# Patient Record
Sex: Female | Born: 1955
Health system: Southern US, Community
[De-identification: ages and names within clinical notes are randomized; demographics above are authoritative.]

## PROBLEM LIST (undated history)

## (undated) DIAGNOSIS — R21 Rash and other nonspecific skin eruption: Secondary | ICD-10-CM

## (undated) DIAGNOSIS — I5032 Chronic diastolic (congestive) heart failure: Secondary | ICD-10-CM

## (undated) DIAGNOSIS — R51 Headache: Secondary | ICD-10-CM

## (undated) DIAGNOSIS — E039 Hypothyroidism, unspecified: Secondary | ICD-10-CM

## (undated) DIAGNOSIS — IMO0002 Reserved for concepts with insufficient information to code with codable children: Secondary | ICD-10-CM

## (undated) DIAGNOSIS — D510 Vitamin B12 deficiency anemia due to intrinsic factor deficiency: Secondary | ICD-10-CM

## (undated) DIAGNOSIS — I4891 Unspecified atrial fibrillation: Secondary | ICD-10-CM

## (undated) DIAGNOSIS — A4901 Methicillin susceptible Staphylococcus aureus infection, unspecified site: Secondary | ICD-10-CM

## (undated) DIAGNOSIS — R0989 Other specified symptoms and signs involving the circulatory and respiratory systems: Secondary | ICD-10-CM

## (undated) DIAGNOSIS — E209 Hypoparathyroidism, unspecified: Secondary | ICD-10-CM

## (undated) DIAGNOSIS — Z8619 Personal history of other infectious and parasitic diseases: Secondary | ICD-10-CM

## (undated) DIAGNOSIS — R160 Hepatomegaly, not elsewhere classified: Secondary | ICD-10-CM

## (undated) DIAGNOSIS — M797 Fibromyalgia: Secondary | ICD-10-CM

## (undated) DIAGNOSIS — C55 Malignant neoplasm of uterus, part unspecified: Secondary | ICD-10-CM

## (undated) DIAGNOSIS — N200 Calculus of kidney: Secondary | ICD-10-CM

## (undated) DIAGNOSIS — I10 Essential (primary) hypertension: Secondary | ICD-10-CM

## (undated) DIAGNOSIS — K209 Esophagitis, unspecified: Secondary | ICD-10-CM

## (undated) DIAGNOSIS — I700 Atherosclerosis of aorta: Secondary | ICD-10-CM

## (undated) DIAGNOSIS — Z9289 Personal history of other medical treatment: Secondary | ICD-10-CM

## (undated) DIAGNOSIS — D219 Benign neoplasm of connective and other soft tissue, unspecified: Secondary | ICD-10-CM

## (undated) DIAGNOSIS — N12 Tubulo-interstitial nephritis, not specified as acute or chronic: Secondary | ICD-10-CM

## (undated) DIAGNOSIS — M7070 Other bursitis of hip, unspecified hip: Secondary | ICD-10-CM

## (undated) DIAGNOSIS — J449 Chronic obstructive pulmonary disease, unspecified: Secondary | ICD-10-CM

## (undated) DIAGNOSIS — F329 Major depressive disorder, single episode, unspecified: Secondary | ICD-10-CM

## (undated) DIAGNOSIS — I499 Cardiac arrhythmia, unspecified: Secondary | ICD-10-CM

## (undated) DIAGNOSIS — C73 Malignant neoplasm of thyroid gland: Secondary | ICD-10-CM

## (undated) DIAGNOSIS — L97509 Non-pressure chronic ulcer of other part of unspecified foot with unspecified severity: Secondary | ICD-10-CM

## (undated) DIAGNOSIS — R87619 Unspecified abnormal cytological findings in specimens from cervix uteri: Secondary | ICD-10-CM

## (undated) DIAGNOSIS — M47814 Spondylosis without myelopathy or radiculopathy, thoracic region: Secondary | ICD-10-CM

## (undated) DIAGNOSIS — M1990 Unspecified osteoarthritis, unspecified site: Secondary | ICD-10-CM

## (undated) DIAGNOSIS — A4902 Methicillin resistant Staphylococcus aureus infection, unspecified site: Secondary | ICD-10-CM

## (undated) DIAGNOSIS — K760 Fatty (change of) liver, not elsewhere classified: Secondary | ICD-10-CM

## (undated) DIAGNOSIS — E538 Deficiency of other specified B group vitamins: Secondary | ICD-10-CM

## (undated) DIAGNOSIS — R112 Nausea with vomiting, unspecified: Secondary | ICD-10-CM

## (undated) DIAGNOSIS — J4489 Other specified chronic obstructive pulmonary disease: Secondary | ICD-10-CM

## (undated) DIAGNOSIS — J189 Pneumonia, unspecified organism: Secondary | ICD-10-CM

## (undated) DIAGNOSIS — R600 Localized edema: Secondary | ICD-10-CM

## (undated) DIAGNOSIS — F411 Generalized anxiety disorder: Secondary | ICD-10-CM

## (undated) DIAGNOSIS — Z9889 Other specified postprocedural states: Secondary | ICD-10-CM

## (undated) DIAGNOSIS — R569 Unspecified convulsions: Secondary | ICD-10-CM

## (undated) DIAGNOSIS — D509 Iron deficiency anemia, unspecified: Secondary | ICD-10-CM

## (undated) DIAGNOSIS — Z8679 Personal history of other diseases of the circulatory system: Secondary | ICD-10-CM

## (undated) DIAGNOSIS — K219 Gastro-esophageal reflux disease without esophagitis: Secondary | ICD-10-CM

## (undated) HISTORY — PX: APPENDECTOMY: SHX54

## (undated) HISTORY — DX: Essential (primary) hypertension: I10

## (undated) HISTORY — DX: Reserved for concepts with insufficient information to code with codable children: IMO0002

## (undated) HISTORY — DX: Non-pressure chronic ulcer of other part of unspecified foot with unspecified severity: L97.509

## (undated) HISTORY — DX: Unspecified atrial fibrillation: I48.91

## (undated) HISTORY — PX: COLONOSCOPY: SHX174

## (undated) HISTORY — DX: Unspecified osteoarthritis, unspecified site: M19.90

## (undated) HISTORY — DX: Gastro-esophageal reflux disease without esophagitis: K21.9

## (undated) HISTORY — DX: Morbid (severe) obesity due to excess calories: E66.01

## (undated) HISTORY — PX: OTHER SURGICAL HISTORY: SHX169

## (undated) HISTORY — DX: Iron deficiency anemia, unspecified: D50.9

## (undated) HISTORY — PX: TONSILLECTOMY AND ADENOIDECTOMY: SUR1326

## (undated) HISTORY — DX: Fatty (change of) liver, not elsewhere classified: K76.0

## (undated) HISTORY — DX: Calculus of kidney: N20.0

## (undated) HISTORY — DX: Vitamin B12 deficiency anemia due to intrinsic factor deficiency: D51.0

## (undated) HISTORY — PX: BUNIONECTOMY: SHX129

## (undated) HISTORY — DX: Hypothyroidism, unspecified: E03.9

## (undated) HISTORY — DX: Unspecified abnormal cytological findings in specimens from cervix uteri: R87.619

## (undated) HISTORY — DX: Benign neoplasm of connective and other soft tissue, unspecified: D21.9

## (undated) HISTORY — DX: Major depressive disorder, single episode, unspecified: F32.9

## (undated) HISTORY — DX: Hypoparathyroidism, unspecified: E20.9

## (undated) HISTORY — DX: Generalized anxiety disorder: F41.1

## (undated) HISTORY — DX: Malignant neoplasm of thyroid gland: C73

## (undated) HISTORY — PX: UPPER GASTROINTESTINAL ENDOSCOPY: SHX188

---
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
## 2003-05-03 ENCOUNTER — Encounter: Payer: Self-pay | Admitting: Podiatrist

## 2005-04-25 ENCOUNTER — Encounter: Payer: Self-pay | Admitting: Internal Medicine

## 2005-04-25 LAB — CONVERTED CEMR LAB

## 2006-03-13 ENCOUNTER — Ambulatory Visit: Payer: Self-pay | Admitting: Internal Medicine

## 2006-04-03 ENCOUNTER — Ambulatory Visit: Payer: Self-pay | Admitting: Internal Medicine

## 2006-04-03 LAB — CONVERTED CEMR LAB: TSH: 5.27 microintl units/mL (ref 0.35–5.50)

## 2006-04-10 ENCOUNTER — Ambulatory Visit: Payer: Self-pay | Admitting: Internal Medicine

## 2006-05-09 ENCOUNTER — Ambulatory Visit: Payer: Self-pay | Admitting: Family Medicine

## 2006-05-11 ENCOUNTER — Ambulatory Visit: Payer: Self-pay | Admitting: Internal Medicine

## 2006-05-11 LAB — CONVERTED CEMR LAB: TSH: 5.03 microintl units/mL (ref 0.35–5.50)

## 2006-05-15 ENCOUNTER — Ambulatory Visit: Payer: Self-pay | Admitting: Internal Medicine

## 2006-06-19 ENCOUNTER — Ambulatory Visit: Payer: Self-pay | Admitting: Internal Medicine

## 2006-06-19 LAB — CONVERTED CEMR LAB: TSH: 8.11 microintl units/mL — ABNORMAL HIGH (ref 0.35–5.50)

## 2006-06-30 ENCOUNTER — Ambulatory Visit: Payer: Self-pay | Admitting: Internal Medicine

## 2006-07-28 ENCOUNTER — Ambulatory Visit: Payer: Self-pay | Admitting: Internal Medicine

## 2006-08-13 ENCOUNTER — Ambulatory Visit: Payer: Self-pay | Admitting: Pulmonary Disease

## 2006-08-13 LAB — CONVERTED CEMR LAB
ALT: 37 units/L (ref 0–40)
AST: 29 units/L (ref 0–37)
Albumin: 3.3 g/dL — ABNORMAL LOW (ref 3.5–5.2)
Alkaline Phosphatase: 97 units/L (ref 39–117)
BUN: 11 mg/dL (ref 6–23)
Basophils Relative: 1.3 % — ABNORMAL HIGH (ref 0.0–1.0)
Bilirubin, Direct: 0.2 mg/dL (ref 0.0–0.3)
CO2: 27 meq/L (ref 19–32)
Calcium: 8.3 mg/dL — ABNORMAL LOW (ref 8.4–10.5)
Chloride: 104 meq/L (ref 96–112)
Creatinine, Ser: 0.8 mg/dL (ref 0.4–1.2)
Eosinophils Relative: 2.8 % (ref 0.0–5.0)
GFR calc Af Amer: 98 mL/min
GFR calc non Af Amer: 81 mL/min
Glucose, Bld: 107 mg/dL — ABNORMAL HIGH (ref 70–99)
HCT: 31.5 % — ABNORMAL LOW (ref 36.0–46.0)
Hemoglobin: 10.9 g/dL — ABNORMAL LOW (ref 12.0–15.0)
IgE (Immunoglobulin E), Serum: 774.9 intl units/mL — ABNORMAL HIGH (ref 0.0–180.0)
Lymphocytes Relative: 8.9 % — ABNORMAL LOW (ref 12.0–46.0)
MCHC: 34.5 g/dL (ref 30.0–36.0)
MCV: 78.2 fL (ref 78.0–100.0)
Monocytes Relative: 2.7 % — ABNORMAL LOW (ref 3.0–11.0)
Neutrophils Relative %: 84.3 % — ABNORMAL HIGH (ref 43.0–77.0)
Platelets: 640 10*3/uL — ABNORMAL HIGH (ref 150–400)
Potassium: 3.6 meq/L (ref 3.5–5.1)
RBC: 4.03 M/uL (ref 3.87–5.11)
RDW: 17.2 % — ABNORMAL HIGH (ref 11.5–14.6)
Sodium: 139 meq/L (ref 135–145)
TSH: 4.34 microintl units/mL (ref 0.35–5.50)
Total Bilirubin: 0.7 mg/dL (ref 0.3–1.2)
Total Protein: 6.4 g/dL (ref 6.0–8.3)
WBC: 10.2 10*3/uL (ref 4.5–10.5)

## 2006-08-18 ENCOUNTER — Ambulatory Visit: Payer: Self-pay | Admitting: Pulmonary Disease

## 2006-09-10 ENCOUNTER — Ambulatory Visit: Payer: Self-pay | Admitting: Pulmonary Disease

## 2006-10-01 ENCOUNTER — Ambulatory Visit: Payer: Self-pay | Admitting: Internal Medicine

## 2006-10-01 LAB — CONVERTED CEMR LAB
BUN: 11 mg/dL (ref 6–23)
CO2: 27 meq/L (ref 19–32)
Calcium: 8.5 mg/dL (ref 8.4–10.5)
Chloride: 106 meq/L (ref 96–112)
Creatinine, Ser: 0.8 mg/dL (ref 0.4–1.2)
Ferritin: 31.8 ng/mL (ref 10.0–291.0)
GFR calc Af Amer: 98 mL/min
GFR calc non Af Amer: 81 mL/min
Glucose, Bld: 87 mg/dL (ref 70–99)
Iron: 37 ug/dL — ABNORMAL LOW (ref 42–145)
Potassium: 4.1 meq/L (ref 3.5–5.1)
Pro B Natriuretic peptide (BNP): 14 pg/mL (ref 0.0–100.0)
Saturation Ratios: 10.6 % — ABNORMAL LOW (ref 20.0–50.0)
Sodium: 139 meq/L (ref 135–145)
TSH: 2.04 microintl units/mL (ref 0.35–5.50)
Transferrin: 248.9 mg/dL (ref >=212.0)
Vit D, 1,25-Dihydroxy: <7 — ABNORMAL LOW (ref 20–57)

## 2006-10-16 ENCOUNTER — Ambulatory Visit: Payer: Self-pay | Admitting: Internal Medicine

## 2006-10-23 ENCOUNTER — Ambulatory Visit: Payer: Self-pay | Admitting: Internal Medicine

## 2006-10-23 LAB — CONVERTED CEMR LAB
Anti Nuclear Antibody(ANA): NEGATIVE
Calcium, Total (PTH): 9.3 mg/dL (ref 8.4–10.5)
Folate: >20 ng/mL
PTH: 42.7 pg/mL (ref 14.0–72.0)
Rheumatoid fact SerPl-aCnc: <20 intl units/mL — ABNORMAL LOW (ref 0.0–20.0)
Vit D, 1,25-Dihydroxy: 21 (ref 20–57)
Vitamin B-12: 318 pg/mL (ref 211–911)

## 2006-12-04 ENCOUNTER — Ambulatory Visit: Payer: Self-pay | Admitting: Internal Medicine

## 2007-01-05 ENCOUNTER — Encounter: Payer: Self-pay | Admitting: Internal Medicine

## 2007-01-05 DIAGNOSIS — J45909 Unspecified asthma, uncomplicated: Secondary | ICD-10-CM | POA: Insufficient documentation

## 2007-01-05 DIAGNOSIS — I10 Essential (primary) hypertension: Secondary | ICD-10-CM

## 2007-01-05 DIAGNOSIS — J309 Allergic rhinitis, unspecified: Secondary | ICD-10-CM | POA: Insufficient documentation

## 2007-01-05 DIAGNOSIS — E039 Hypothyroidism, unspecified: Secondary | ICD-10-CM | POA: Insufficient documentation

## 2007-01-05 HISTORY — DX: Essential (primary) hypertension: I10

## 2007-02-19 ENCOUNTER — Ambulatory Visit: Payer: Self-pay | Admitting: Internal Medicine

## 2007-05-10 ENCOUNTER — Ambulatory Visit: Payer: Self-pay | Admitting: Internal Medicine

## 2007-05-10 DIAGNOSIS — J069 Acute upper respiratory infection, unspecified: Secondary | ICD-10-CM | POA: Insufficient documentation

## 2007-05-10 DIAGNOSIS — D509 Iron deficiency anemia, unspecified: Secondary | ICD-10-CM | POA: Insufficient documentation

## 2007-05-10 DIAGNOSIS — D649 Anemia, unspecified: Secondary | ICD-10-CM | POA: Insufficient documentation

## 2007-05-10 DIAGNOSIS — E559 Vitamin D deficiency, unspecified: Secondary | ICD-10-CM | POA: Insufficient documentation

## 2007-05-10 HISTORY — DX: Vitamin D deficiency, unspecified: E55.9

## 2007-05-10 HISTORY — DX: Iron deficiency anemia, unspecified: D50.9

## 2007-05-10 LAB — CONVERTED CEMR LAB
ALT: 34 units/L (ref 0–35)
AST: 25 units/L (ref 0–37)
BUN: 17 mg/dL (ref 6–23)
Basophils Relative: 3.4 % — ABNORMAL HIGH (ref 0.0–1.0)
CO2: 28 meq/L (ref 19–32)
Calcium: 8.5 mg/dL (ref 8.4–10.5)
Chloride: 104 meq/L (ref 96–112)
Cholesterol: 82 mg/dL (ref 0–200)
Creatinine, Ser: 0.7 mg/dL (ref 0.4–1.2)
Eosinophils Relative: 1.5 % (ref 0.0–5.0)
GFR calc Af Amer: 114 mL/min
GFR calc non Af Amer: 95 mL/min
Glucose, Bld: 96 mg/dL (ref 70–99)
HCT: 32.7 % — ABNORMAL LOW (ref 36.0–46.0)
HDL: 15.6 mg/dL — ABNORMAL LOW (ref >39.0)
Hemoglobin: 10.7 g/dL — ABNORMAL LOW (ref 12.0–15.0)
LDL Cholesterol: 53 mg/dL (ref 0–99)
Lymphocytes Relative: 12.6 % (ref 12.0–46.0)
MCHC: 32.6 g/dL (ref 30.0–36.0)
MCV: 80.7 fL (ref 78.0–100.0)
Monocytes Relative: 3 % (ref 3.0–11.0)
Neutrophils Relative %: 79.5 % — ABNORMAL HIGH (ref 43.0–77.0)
Platelets: 185 10*3/uL (ref 150–400)
Potassium: 4.2 meq/L (ref 3.5–5.1)
RBC: 4.05 M/uL (ref 3.87–5.11)
RDW: 17.5 % — ABNORMAL HIGH (ref 11.5–14.6)
Sodium: 139 meq/L (ref 135–145)
Total CHOL/HDL Ratio: 5.3
Triglycerides: 68 mg/dL (ref 0–149)
VLDL: 14 mg/dL (ref 0–40)
WBC: 13.1 10*3/uL — ABNORMAL HIGH (ref 4.5–10.5)

## 2007-05-11 ENCOUNTER — Ambulatory Visit: Payer: Self-pay | Admitting: Oncology

## 2007-05-12 LAB — CONVERTED CEMR LAB: Vit D, 1,25-Dihydroxy: 18 — ABNORMAL LOW (ref 30–89)

## 2007-05-21 ENCOUNTER — Telehealth: Payer: Self-pay | Admitting: Internal Medicine

## 2007-06-09 ENCOUNTER — Encounter: Payer: Self-pay | Admitting: Internal Medicine

## 2007-06-09 LAB — MORPHOLOGY

## 2007-06-09 LAB — CBC & DIFF AND RETIC
BASO%: 0.2 % (ref 0.0–2.0)
EOS%: 1.5 % (ref 0.0–7.0)
IRF: 0.48 — ABNORMAL HIGH (ref 0.130–0.330)
MCH: 26.2 pg (ref 26.0–34.0)
MCV: 80.2 fL — ABNORMAL LOW (ref 81.0–101.0)
MONO%: 4.8 % (ref 0.0–13.0)
RBC: 4.25 10*6/uL (ref 3.70–5.32)
RDW: 19 % — ABNORMAL HIGH (ref 11.3–14.5)
RETIC #: 125.4 10*3/uL — ABNORMAL HIGH (ref 19.7–115.1)
Retic %: 3 % — ABNORMAL HIGH (ref 0.4–2.3)
lymph#: 1.9 10*3/uL (ref 0.9–3.3)

## 2007-06-09 LAB — URINALYSIS, MICROSCOPIC - CHCC
Bilirubin (Urine): NEGATIVE
Blood: NEGATIVE
Glucose: NEGATIVE g/dL
Leukocyte Esterase: NEGATIVE
Nitrite: NEGATIVE
Specific Gravity, Urine: 1.01 (ref 1.003–1.035)
WBC, UA: NEGATIVE (ref 0–2)

## 2007-06-09 LAB — CHCC SMEAR

## 2007-06-11 LAB — IMMUNOFIXATION ELECTROPHORESIS
IgA: 334 mg/dL (ref 68–378)
IgG (Immunoglobin G), Serum: 1250 mg/dL (ref 694–1618)
IgM, Serum: 244 mg/dL (ref 60–263)
Total Protein, Serum Electrophoresis: 7 g/dL (ref 6.0–8.3)

## 2007-06-11 LAB — FERRITIN: Ferritin: 63 ng/mL (ref 10–291)

## 2007-06-11 LAB — IRON AND TIBC: UIBC: 274 ug/dL

## 2007-07-02 ENCOUNTER — Encounter: Payer: Self-pay | Admitting: Internal Medicine

## 2007-07-14 ENCOUNTER — Ambulatory Visit: Payer: Self-pay | Admitting: Oncology

## 2007-07-14 ENCOUNTER — Encounter: Payer: Self-pay | Admitting: Internal Medicine

## 2007-08-07 ENCOUNTER — Ambulatory Visit: Payer: Self-pay | Admitting: Family Medicine

## 2007-08-07 DIAGNOSIS — J45901 Unspecified asthma with (acute) exacerbation: Secondary | ICD-10-CM | POA: Insufficient documentation

## 2007-09-08 ENCOUNTER — Ambulatory Visit: Payer: Self-pay | Admitting: Oncology

## 2007-09-13 ENCOUNTER — Ambulatory Visit: Payer: Self-pay | Admitting: Internal Medicine

## 2007-09-13 LAB — CBC WITH DIFFERENTIAL/PLATELET
BASO%: 0.6 % (ref 0.0–2.0)
Eosinophils Absolute: 0.2 10*3/uL (ref 0.0–0.5)
MCHC: 32.3 g/dL (ref 32.0–36.0)
MONO#: 0.6 10*3/uL (ref 0.1–0.9)
NEUT#: 9.9 10*3/uL — ABNORMAL HIGH (ref 1.5–6.5)
RBC: 4.4 10*6/uL (ref 3.70–5.32)
RDW: 18.7 % — ABNORMAL HIGH (ref 11.3–14.5)
WBC: 12.5 10*3/uL — ABNORMAL HIGH (ref 3.9–10.0)
lymph#: 1.7 10*3/uL (ref 0.9–3.3)

## 2007-09-13 LAB — IRON AND TIBC
%SAT: 9 % — ABNORMAL LOW (ref 20–55)
Iron: 30 ug/dL — ABNORMAL LOW (ref 42–145)

## 2007-09-13 LAB — CHCC SMEAR

## 2007-09-13 LAB — MORPHOLOGY: PLT EST: ADEQUATE

## 2007-09-13 LAB — CONVERTED CEMR LAB: Vit D, 1,25-Dihydroxy: 33 (ref 30–89)

## 2007-09-27 ENCOUNTER — Telehealth: Payer: Self-pay | Admitting: Internal Medicine

## 2007-10-14 ENCOUNTER — Ambulatory Visit: Payer: Self-pay | Admitting: Internal Medicine

## 2007-10-14 LAB — CONVERTED CEMR LAB
BUN: 10 mg/dL (ref 6–23)
CO2: 25 meq/L (ref 19–32)
Calcium: 8.5 mg/dL (ref 8.4–10.5)
Chloride: 105 meq/L (ref 96–112)
Cholesterol: 105 mg/dL (ref 0–200)
Creatinine, Ser: 0.8 mg/dL (ref 0.4–1.2)
GFR calc Af Amer: 98 mL/min
GFR calc non Af Amer: 81 mL/min
Glucose, Bld: 107 mg/dL — ABNORMAL HIGH (ref 70–99)
HDL: 30.4 mg/dL — ABNORMAL LOW (ref >39.0)
LDL Cholesterol: 62 mg/dL (ref 0–99)
Potassium: 4.1 meq/L (ref 3.5–5.1)
Sodium: 136 meq/L (ref 135–145)
TSH: 6.29 microintl units/mL — ABNORMAL HIGH (ref 0.35–5.50)
Total CHOL/HDL Ratio: 3.5
Triglycerides: 64 mg/dL (ref 0–149)
VLDL: 13 mg/dL (ref 0–40)

## 2007-10-19 LAB — CONVERTED CEMR LAB: Pap Smear: NORMAL

## 2007-10-25 ENCOUNTER — Ambulatory Visit: Payer: Self-pay | Admitting: Internal Medicine

## 2007-11-03 ENCOUNTER — Ambulatory Visit: Payer: Self-pay | Admitting: Oncology

## 2007-11-08 ENCOUNTER — Encounter: Payer: Self-pay | Admitting: Internal Medicine

## 2007-11-08 LAB — CBC WITH DIFFERENTIAL/PLATELET
Basophils Absolute: 0 10*3/uL (ref 0.0–0.1)
EOS%: 1.9 % (ref 0.0–7.0)
HGB: 11 g/dL — ABNORMAL LOW (ref 11.6–15.9)
MCH: 25.1 pg — ABNORMAL LOW (ref 26.0–34.0)
MCV: 77.8 fL — ABNORMAL LOW (ref 81.0–101.0)
MONO%: 4.6 % (ref 0.0–13.0)
NEUT#: 13 10*3/uL — ABNORMAL HIGH (ref 1.5–6.5)
RBC: 4.36 10*6/uL (ref 3.70–5.32)
RDW: 19.6 % — ABNORMAL HIGH (ref 11.3–14.5)
lymph#: 1.9 10*3/uL (ref 0.9–3.3)

## 2007-11-08 LAB — FERRITIN: Ferritin: 47 ng/mL (ref 10–291)

## 2007-12-30 ENCOUNTER — Ambulatory Visit: Payer: Self-pay | Admitting: Oncology

## 2008-01-03 ENCOUNTER — Encounter: Payer: Self-pay | Admitting: Internal Medicine

## 2008-01-03 LAB — COMPREHENSIVE METABOLIC PANEL
Albumin: 4 g/dL (ref 3.5–5.2)
Alkaline Phosphatase: 92 U/L (ref 39–117)
BUN: 14 mg/dL (ref 6–23)
CO2: 23 mEq/L (ref 19–32)
Glucose, Bld: 83 mg/dL (ref 70–99)
Total Bilirubin: 0.4 mg/dL (ref 0.3–1.2)

## 2008-01-03 LAB — MORPHOLOGY: PLT EST: ADEQUATE

## 2008-01-03 LAB — CBC WITH DIFFERENTIAL/PLATELET
BASO%: 0.5 % (ref 0.0–2.0)
EOS%: 2.4 % (ref 0.0–7.0)
MCHC: 32.2 g/dL (ref 32.0–36.0)
MONO#: 0.6 10*3/uL (ref 0.1–0.9)
RBC: 4.39 10*6/uL (ref 3.70–5.32)
RDW: 18.2 % — ABNORMAL HIGH (ref 11.3–14.5)
WBC: 10.6 10*3/uL — ABNORMAL HIGH (ref 3.9–10.0)
lymph#: 1.5 10*3/uL (ref 0.9–3.3)

## 2008-01-03 LAB — IRON AND TIBC
Iron: 41 ug/dL — ABNORMAL LOW (ref 42–145)
TIBC: 361 ug/dL (ref 250–470)
UIBC: 320 ug/dL

## 2008-01-03 LAB — FERRITIN: Ferritin: 47 ng/mL (ref 10–291)

## 2008-02-04 ENCOUNTER — Telehealth: Payer: Self-pay | Admitting: Internal Medicine

## 2008-02-15 ENCOUNTER — Ambulatory Visit: Payer: Self-pay | Admitting: Internal Medicine

## 2008-02-15 LAB — CONVERTED CEMR LAB
BUN: 9 mg/dL (ref 6–23)
CO2: 29 meq/L (ref 19–32)
Calcium: 8.6 mg/dL (ref 8.4–10.5)
Chloride: 105 meq/L (ref 96–112)
Creatinine, Ser: 0.7 mg/dL (ref 0.4–1.2)
GFR calc Af Amer: 114 mL/min
GFR calc non Af Amer: 94 mL/min
Glucose, Bld: 81 mg/dL (ref 70–99)
Potassium: 4.3 meq/L (ref 3.5–5.1)
Sodium: 140 meq/L (ref 135–145)
TSH: 5.47 microintl units/mL (ref 0.35–5.50)

## 2008-02-22 ENCOUNTER — Ambulatory Visit: Payer: Self-pay | Admitting: Internal Medicine

## 2008-03-07 ENCOUNTER — Ambulatory Visit: Payer: Self-pay | Admitting: Internal Medicine

## 2008-03-07 LAB — CONVERTED CEMR LAB
BUN: 12 mg/dL (ref 6–23)
CO2: 30 meq/L (ref 19–32)
Calcium: 8.8 mg/dL (ref 8.4–10.5)
Chloride: 104 meq/L (ref 96–112)
Creatinine, Ser: 0.8 mg/dL (ref 0.4–1.2)
GFR calc Af Amer: 98 mL/min
GFR calc non Af Amer: 81 mL/min
Glucose, Bld: 84 mg/dL (ref 70–99)
Potassium: 3.7 meq/L (ref 3.5–5.1)
Sodium: 141 meq/L (ref 135–145)
TSH: 3.83 microintl units/mL (ref 0.35–5.50)

## 2008-03-09 ENCOUNTER — Telehealth: Payer: Self-pay | Admitting: Internal Medicine

## 2008-03-24 ENCOUNTER — Encounter: Payer: Self-pay | Admitting: Internal Medicine

## 2008-04-17 ENCOUNTER — Ambulatory Visit: Payer: Self-pay | Admitting: Internal Medicine

## 2008-04-17 LAB — CONVERTED CEMR LAB
BUN: 17 mg/dL (ref 6–23)
CO2: 26 meq/L (ref 19–32)
Calcium: 9 mg/dL (ref 8.4–10.5)
Chloride: 102 meq/L (ref 96–112)
Creatinine, Ser: 0.7 mg/dL (ref 0.4–1.2)
GFR calc Af Amer: 114 mL/min
GFR calc non Af Amer: 94 mL/min
Glucose, Bld: 101 mg/dL — ABNORMAL HIGH (ref 70–99)
Potassium: 4 meq/L (ref 3.5–5.1)
Sodium: 136 meq/L (ref 135–145)
TSH: 2.95 microintl units/mL (ref 0.35–5.50)

## 2008-04-24 ENCOUNTER — Ambulatory Visit: Payer: Self-pay | Admitting: Internal Medicine

## 2008-04-25 ENCOUNTER — Telehealth: Payer: Self-pay | Admitting: Internal Medicine

## 2008-05-31 ENCOUNTER — Ambulatory Visit: Payer: Self-pay | Admitting: Internal Medicine

## 2008-05-31 DIAGNOSIS — R300 Dysuria: Secondary | ICD-10-CM | POA: Insufficient documentation

## 2008-05-31 DIAGNOSIS — H6590 Unspecified nonsuppurative otitis media, unspecified ear: Secondary | ICD-10-CM | POA: Insufficient documentation

## 2008-05-31 LAB — CONVERTED CEMR LAB
Bilirubin Urine: NEGATIVE
Blood in Urine, dipstick: NEGATIVE
Glucose, Urine, Semiquant: NEGATIVE
Ketones, urine, test strip: NEGATIVE
Nitrite: NEGATIVE
Protein, U semiquant: NEGATIVE
Specific Gravity, Urine: 1.01
Urobilinogen, UA: 0.2
WBC Urine, dipstick: NEGATIVE
pH: 5

## 2008-06-23 ENCOUNTER — Ambulatory Visit: Payer: Self-pay | Admitting: Oncology

## 2008-07-16 ENCOUNTER — Emergency Department (HOSPITAL_COMMUNITY): Admission: EM | Admit: 2008-07-16 | Discharge: 2008-07-16 | Payer: Self-pay | Admitting: Family Medicine

## 2008-07-19 ENCOUNTER — Ambulatory Visit: Payer: Self-pay | Admitting: Internal Medicine

## 2008-07-19 DIAGNOSIS — L02419 Cutaneous abscess of limb, unspecified: Secondary | ICD-10-CM | POA: Insufficient documentation

## 2008-07-19 DIAGNOSIS — R609 Edema, unspecified: Secondary | ICD-10-CM | POA: Insufficient documentation

## 2008-07-19 DIAGNOSIS — L03119 Cellulitis of unspecified part of limb: Secondary | ICD-10-CM

## 2008-07-21 ENCOUNTER — Telehealth: Payer: Self-pay | Admitting: Internal Medicine

## 2008-07-21 ENCOUNTER — Ambulatory Visit: Payer: Self-pay | Admitting: Internal Medicine

## 2008-07-21 LAB — CONVERTED CEMR LAB
BUN: 19 mg/dL (ref 6–23)
Basophils Absolute: 0 10*3/uL (ref 0.0–0.1)
Basophils Relative: 0 % (ref 0.0–3.0)
CO2: 26 meq/L (ref 19–32)
Calcium: 8.8 mg/dL (ref 8.4–10.5)
Chloride: 99 meq/L (ref 96–112)
Creatinine, Ser: 1 mg/dL (ref 0.4–1.2)
Eosinophils Absolute: 0.3 10*3/uL (ref 0.0–0.7)
Eosinophils Relative: 2 % (ref 0.0–5.0)
GFR calc Af Amer: 75 mL/min
GFR calc non Af Amer: 62 mL/min
Glucose, Bld: 82 mg/dL (ref 70–99)
HCT: 34.2 % — ABNORMAL LOW (ref 36.0–46.0)
Hemoglobin: 11.2 g/dL — ABNORMAL LOW (ref 12.0–15.0)
Lymphocytes Relative: 15.4 % (ref 12.0–46.0)
MCHC: 32.7 g/dL (ref 30.0–36.0)
MCV: 79.7 fL (ref 78.0–100.0)
Monocytes Absolute: 0.5 10*3/uL (ref 0.1–1.0)
Monocytes Relative: 3 % (ref 3.0–12.0)
Neutro Abs: 13.7 10*3/uL — ABNORMAL HIGH (ref 1.4–7.7)
Neutrophils Relative %: 79.6 % — ABNORMAL HIGH (ref 43.0–77.0)
Platelets: 150 10*3/uL (ref 150–400)
Potassium: 3.8 meq/L (ref 3.5–5.1)
RBC: 4.29 M/uL (ref 3.87–5.11)
RDW: 17.8 % — ABNORMAL HIGH (ref 11.5–14.6)
Sodium: 134 meq/L — ABNORMAL LOW (ref 135–145)
TSH: 3.54 microintl units/mL (ref 0.35–5.50)
WBC: 16.8 10*3/uL — ABNORMAL HIGH (ref 4.5–10.5)

## 2008-07-24 ENCOUNTER — Telehealth (INDEPENDENT_AMBULATORY_CARE_PROVIDER_SITE_OTHER): Payer: Self-pay | Admitting: *Deleted

## 2008-08-04 ENCOUNTER — Telehealth (INDEPENDENT_AMBULATORY_CARE_PROVIDER_SITE_OTHER): Payer: Self-pay | Admitting: *Deleted

## 2008-08-07 ENCOUNTER — Ambulatory Visit: Payer: Self-pay | Admitting: Internal Medicine

## 2008-08-08 ENCOUNTER — Ambulatory Visit: Payer: Self-pay | Admitting: Interventional Radiology

## 2008-08-08 ENCOUNTER — Ambulatory Visit (HOSPITAL_BASED_OUTPATIENT_CLINIC_OR_DEPARTMENT_OTHER): Admission: RE | Admit: 2008-08-08 | Discharge: 2008-08-08 | Payer: Self-pay | Admitting: Internal Medicine

## 2008-08-08 ENCOUNTER — Telehealth: Payer: Self-pay | Admitting: Internal Medicine

## 2008-09-07 ENCOUNTER — Ambulatory Visit: Payer: Self-pay | Admitting: Internal Medicine

## 2008-09-07 DIAGNOSIS — J329 Chronic sinusitis, unspecified: Secondary | ICD-10-CM | POA: Insufficient documentation

## 2008-11-10 ENCOUNTER — Telehealth: Payer: Self-pay | Admitting: Internal Medicine

## 2008-12-05 ENCOUNTER — Ambulatory Visit: Payer: Self-pay | Admitting: Internal Medicine

## 2008-12-05 ENCOUNTER — Encounter: Payer: Self-pay | Admitting: Family Medicine

## 2008-12-07 LAB — CONVERTED CEMR LAB
ALT: 30 units/L (ref 0–35)
AST: 19 units/L (ref 0–37)
Albumin: 3.7 g/dL (ref 3.5–5.2)
Alkaline Phosphatase: 96 units/L (ref 39–117)
BUN: 13 mg/dL (ref 6–23)
Bilirubin, Direct: 0.2 mg/dL (ref 0.0–0.3)
CO2: 23 meq/L (ref 19–32)
Calcium: 8.5 mg/dL (ref 8.4–10.5)
Chloride: 105 meq/L (ref 96–112)
Creatinine, Ser: 0.74 mg/dL (ref 0.40–1.20)
Glucose, Bld: 95 mg/dL (ref 70–99)
Indirect Bilirubin: 0.4 mg/dL (ref 0.0–0.9)
Potassium: 3.7 meq/L (ref 3.5–5.3)
Sodium: 139 meq/L (ref 135–145)
TSH: 4.073 microintl units/mL (ref 0.350–4.500)
Total Bilirubin: 0.6 mg/dL (ref 0.3–1.2)
Total Protein: 6.4 g/dL (ref 6.0–8.3)
Vit D, 1,25-Dihydroxy: 33 (ref 30–89)

## 2008-12-20 ENCOUNTER — Ambulatory Visit: Payer: Self-pay | Admitting: Internal Medicine

## 2009-03-22 ENCOUNTER — Ambulatory Visit: Payer: Self-pay | Admitting: Internal Medicine

## 2009-03-22 LAB — CONVERTED CEMR LAB
Basophils Absolute: 0.1 10*3/uL (ref 0.0–0.1)
Basophils Relative: 1 % (ref 0–1)
Eosinophils Absolute: 0.3 10*3/uL (ref 0.0–0.7)
Eosinophils Relative: 2 % (ref 0–5)
HCT: 37.3 % (ref 36.0–46.0)
Hemoglobin: 11.2 g/dL — ABNORMAL LOW (ref 12.0–15.0)
Lymphocytes Relative: 14 % (ref 12–46)
Lymphs Abs: 2.1 10*3/uL (ref 0.7–4.0)
MCHC: 30 g/dL (ref 30.0–36.0)
MCV: 81.1 fL (ref 78.0–100.0)
Monocytes Absolute: 0.8 10*3/uL (ref 0.1–1.0)
Monocytes Relative: 5 % (ref 3–12)
Neutro Abs: 11.9 10*3/uL — ABNORMAL HIGH (ref 1.7–7.7)
Neutrophils Relative %: 79 % — ABNORMAL HIGH (ref 43–77)
Platelets: 197 10*3/uL (ref 150–400)
RBC: 4.6 M/uL (ref 3.87–5.11)
RDW: 17.7 % — ABNORMAL HIGH (ref 11.5–15.5)
TSH: 1.094 microintl units/mL (ref 0.350–4.500)
WBC: 15.2 10*3/uL — ABNORMAL HIGH (ref 4.0–10.5)

## 2009-03-24 ENCOUNTER — Telehealth: Payer: Self-pay | Admitting: Internal Medicine

## 2009-03-24 DIAGNOSIS — D72829 Elevated white blood cell count, unspecified: Secondary | ICD-10-CM | POA: Insufficient documentation

## 2009-03-24 HISTORY — DX: Elevated white blood cell count, unspecified: D72.829

## 2009-03-26 ENCOUNTER — Ambulatory Visit: Payer: Self-pay | Admitting: Hematology & Oncology

## 2009-04-04 ENCOUNTER — Ambulatory Visit: Payer: Self-pay | Admitting: Internal Medicine

## 2009-04-11 ENCOUNTER — Encounter: Payer: Self-pay | Admitting: Internal Medicine

## 2009-04-11 LAB — CBC WITH DIFFERENTIAL (CANCER CENTER ONLY)
BASO#: 0.1 10*3/uL (ref 0.0–0.2)
BASO%: 0.8 % (ref 0.0–2.0)
EOS%: 2.7 % (ref 0.0–7.0)
HGB: 11.4 g/dL — ABNORMAL LOW (ref 11.6–15.9)
MCH: 24.4 pg — ABNORMAL LOW (ref 26.0–34.0)
MCHC: 32.4 g/dL (ref 32.0–36.0)
MONO%: 4.9 % (ref 0.0–13.0)
NEUT#: 8.6 10*3/uL — ABNORMAL HIGH (ref 1.5–6.5)
RDW: 15 % — ABNORMAL HIGH (ref 10.5–14.6)

## 2009-04-11 LAB — CHCC SATELLITE - SMEAR

## 2009-04-11 LAB — VITAMIN B12: Vitamin B-12: 382 pg/mL (ref 211–911)

## 2009-04-11 LAB — TECHNOLOGIST REVIEW CHCC SATELLITE

## 2009-05-22 ENCOUNTER — Ambulatory Visit: Payer: Self-pay | Admitting: Internal Medicine

## 2009-05-22 DIAGNOSIS — R5381 Other malaise: Secondary | ICD-10-CM | POA: Insufficient documentation

## 2009-05-22 DIAGNOSIS — R5383 Other fatigue: Secondary | ICD-10-CM

## 2009-05-22 DIAGNOSIS — K219 Gastro-esophageal reflux disease without esophagitis: Secondary | ICD-10-CM | POA: Insufficient documentation

## 2009-05-22 LAB — CONVERTED CEMR LAB
BUN: 10 mg/dL (ref 6–23)
CO2: 27 meq/L (ref 19–32)
Calcium: 10.1 mg/dL (ref 8.4–10.5)
Chloride: 99 meq/L (ref 96–112)
Creatinine, Ser: 0.81 mg/dL (ref 0.40–1.20)
Glucose, Bld: 89 mg/dL (ref 70–99)
Potassium: 4.4 meq/L (ref 3.5–5.3)
Sodium: 140 meq/L (ref 135–145)
TSH: 2.425 microintl units/mL (ref 0.350–4.500)

## 2009-05-23 ENCOUNTER — Encounter: Payer: Self-pay | Admitting: Internal Medicine

## 2009-06-09 ENCOUNTER — Emergency Department (HOSPITAL_COMMUNITY): Admission: EM | Admit: 2009-06-09 | Discharge: 2009-06-09 | Payer: Self-pay | Admitting: Family Medicine

## 2009-06-18 ENCOUNTER — Encounter: Payer: Self-pay | Admitting: Internal Medicine

## 2009-06-18 ENCOUNTER — Ambulatory Visit (HOSPITAL_BASED_OUTPATIENT_CLINIC_OR_DEPARTMENT_OTHER): Admission: RE | Admit: 2009-06-18 | Discharge: 2009-06-18 | Payer: Self-pay | Admitting: Internal Medicine

## 2009-06-19 ENCOUNTER — Ambulatory Visit: Payer: Self-pay | Admitting: Pulmonary Disease

## 2009-07-02 ENCOUNTER — Telehealth: Payer: Self-pay | Admitting: Internal Medicine

## 2009-07-04 ENCOUNTER — Encounter: Payer: Self-pay | Admitting: Internal Medicine

## 2009-07-04 ENCOUNTER — Encounter: Admission: RE | Admit: 2009-07-04 | Discharge: 2009-07-04 | Payer: Self-pay | Admitting: Internal Medicine

## 2009-07-10 ENCOUNTER — Telehealth: Payer: Self-pay | Admitting: Internal Medicine

## 2009-07-30 ENCOUNTER — Telehealth: Payer: Self-pay | Admitting: Internal Medicine

## 2009-08-14 ENCOUNTER — Ambulatory Visit: Payer: Self-pay | Admitting: Hematology & Oncology

## 2009-08-15 ENCOUNTER — Encounter: Payer: Self-pay | Admitting: Internal Medicine

## 2009-08-15 LAB — CBC WITH DIFFERENTIAL (CANCER CENTER ONLY)
Eosinophils Absolute: 0.3 10*3/uL (ref 0.0–0.5)
HCT: 37.3 % (ref 34.8–46.6)
LYMPH%: 19.8 % (ref 14.0–48.0)
MCV: 82 fL (ref 81–101)
MONO#: 0.8 10*3/uL (ref 0.1–0.9)
NEUT%: 70.9 % (ref 39.6–80.0)
Platelets: 299 10*3/uL (ref 145–400)
RBC: 4.53 10*6/uL (ref 3.70–5.32)
WBC: 12.8 10*3/uL — ABNORMAL HIGH (ref 3.9–10.0)

## 2009-08-15 LAB — TECHNOLOGIST REVIEW CHCC SATELLITE

## 2009-09-19 ENCOUNTER — Ambulatory Visit: Payer: Self-pay | Admitting: Internal Medicine

## 2009-09-19 DIAGNOSIS — M545 Low back pain, unspecified: Secondary | ICD-10-CM | POA: Insufficient documentation

## 2009-09-25 ENCOUNTER — Encounter: Admission: RE | Admit: 2009-09-25 | Discharge: 2009-09-25 | Payer: Self-pay | Admitting: Internal Medicine

## 2009-10-05 ENCOUNTER — Telehealth: Payer: Self-pay | Admitting: Internal Medicine

## 2009-10-10 ENCOUNTER — Ambulatory Visit: Payer: Self-pay | Admitting: Internal Medicine

## 2009-10-10 DIAGNOSIS — L578 Other skin changes due to chronic exposure to nonionizing radiation: Secondary | ICD-10-CM | POA: Insufficient documentation

## 2009-10-24 ENCOUNTER — Telehealth: Payer: Self-pay | Admitting: Internal Medicine

## 2009-10-24 LAB — CONVERTED CEMR LAB: Pap Smear: NORMAL

## 2009-10-25 ENCOUNTER — Encounter: Payer: Self-pay | Admitting: Internal Medicine

## 2009-10-25 ENCOUNTER — Ambulatory Visit: Payer: Self-pay | Admitting: Hematology & Oncology

## 2009-10-25 LAB — CONVERTED CEMR LAB
BUN: 17 mg/dL (ref 6–23)
CO2: 26 meq/L (ref 19–32)
Calcium: 9.4 mg/dL (ref 8.4–10.5)
Chloride: 98 meq/L (ref 96–112)
Creatinine, Ser: 0.86 mg/dL (ref 0.40–1.20)
Glucose, Bld: 89 mg/dL (ref 70–99)
Potassium: 3.7 meq/L (ref 3.5–5.3)
Sodium: 137 meq/L (ref 135–145)

## 2009-10-31 ENCOUNTER — Encounter: Payer: Self-pay | Admitting: Internal Medicine

## 2009-10-31 LAB — CBC WITH DIFFERENTIAL (CANCER CENTER ONLY)
BASO#: 0.1 10*3/uL (ref 0.0–0.2)
Eosinophils Absolute: 0.4 10*3/uL (ref 0.0–0.5)
HCT: 39.6 % (ref 34.8–46.6)
HGB: 12.8 g/dL (ref 11.6–15.9)
LYMPH#: 2.8 10*3/uL (ref 0.9–3.3)
MONO#: 0.7 10*3/uL (ref 0.1–0.9)
NEUT%: 64.9 % (ref 39.6–80.0)
WBC: 11.2 10*3/uL — ABNORMAL HIGH (ref 3.9–10.0)

## 2009-11-09 ENCOUNTER — Encounter: Payer: Self-pay | Admitting: Internal Medicine

## 2010-01-10 ENCOUNTER — Ambulatory Visit: Payer: Self-pay | Admitting: Internal Medicine

## 2010-02-05 ENCOUNTER — Ambulatory Visit (HOSPITAL_BASED_OUTPATIENT_CLINIC_OR_DEPARTMENT_OTHER): Admission: RE | Admit: 2010-02-05 | Discharge: 2010-02-05 | Payer: Self-pay | Admitting: Internal Medicine

## 2010-02-05 ENCOUNTER — Ambulatory Visit: Payer: Self-pay | Admitting: Internal Medicine

## 2010-02-05 ENCOUNTER — Ambulatory Visit: Payer: Self-pay | Admitting: Interventional Radiology

## 2010-02-05 DIAGNOSIS — R070 Pain in throat: Secondary | ICD-10-CM | POA: Insufficient documentation

## 2010-02-05 DIAGNOSIS — N951 Menopausal and female climacteric states: Secondary | ICD-10-CM | POA: Insufficient documentation

## 2010-02-06 LAB — CONVERTED CEMR LAB
BUN: 10 mg/dL (ref 6–23)
CO2: 24 meq/L (ref 19–32)
Calcium: 8.7 mg/dL (ref 8.4–10.5)
Chloride: 103 meq/L (ref 96–112)
Creatinine, Ser: 0.79 mg/dL (ref 0.40–1.20)
Free T4: 1.54 ng/dL (ref 0.80–1.80)
Glucose, Bld: 95 mg/dL (ref 70–99)
IgE (Immunoglobulin E), Serum: 463.6 intl units/mL — ABNORMAL HIGH (ref 0.0–180.0)
Potassium: 3.8 meq/L (ref 3.5–5.3)
Sodium: 138 meq/L (ref 135–145)
TSH: 0.364 microintl units/mL (ref 0.350–4.500)

## 2010-02-07 ENCOUNTER — Telehealth: Payer: Self-pay | Admitting: Internal Medicine

## 2010-02-07 DIAGNOSIS — E041 Nontoxic single thyroid nodule: Secondary | ICD-10-CM | POA: Insufficient documentation

## 2010-02-19 ENCOUNTER — Other Ambulatory Visit: Admission: RE | Admit: 2010-02-19 | Discharge: 2010-02-19 | Payer: Self-pay | Admitting: Diagnostic Radiology

## 2010-02-19 ENCOUNTER — Encounter: Admission: RE | Admit: 2010-02-19 | Discharge: 2010-02-19 | Payer: Self-pay | Admitting: Internal Medicine

## 2010-02-19 ENCOUNTER — Encounter: Payer: Self-pay | Admitting: Internal Medicine

## 2010-02-22 ENCOUNTER — Telehealth: Payer: Self-pay | Admitting: Internal Medicine

## 2010-03-14 ENCOUNTER — Ambulatory Visit: Payer: Self-pay | Admitting: Internal Medicine

## 2010-03-14 DIAGNOSIS — B370 Candidal stomatitis: Secondary | ICD-10-CM | POA: Insufficient documentation

## 2010-03-18 ENCOUNTER — Ambulatory Visit: Payer: Self-pay | Admitting: Internal Medicine

## 2010-03-20 LAB — CONVERTED CEMR LAB: IgE (Immunoglobulin E), Serum: 542.1 intl units/mL — ABNORMAL HIGH (ref 0.0–180.0)

## 2010-04-22 ENCOUNTER — Ambulatory Visit (HOSPITAL_COMMUNITY): Admission: RE | Admit: 2010-04-22 | Discharge: 2010-04-22 | Payer: Self-pay | Admitting: Surgery

## 2010-04-29 ENCOUNTER — Ambulatory Visit: Payer: Self-pay | Admitting: Hematology & Oncology

## 2010-04-30 ENCOUNTER — Telehealth: Payer: Self-pay | Admitting: Internal Medicine

## 2010-04-30 LAB — CONVERTED CEMR LAB
BUN: 15 mg/dL (ref 6–23)
Basophils Absolute: 0 10*3/uL (ref 0.0–0.1)
Basophils Relative: 0 % (ref 0–1)
CO2: 28 meq/L (ref 19–32)
CRP, High Sensitivity: 20 — ABNORMAL HIGH
Calcium: 9 mg/dL (ref 8.4–10.5)
Chloride: 103 meq/L (ref 96–112)
Cholesterol: 130 mg/dL (ref 0–200)
Creatinine, Ser: 0.81 mg/dL (ref 0.40–1.20)
Eosinophils Absolute: 0.3 10*3/uL (ref 0.0–0.7)
Eosinophils Relative: 2 % (ref 0–5)
Glucose, Bld: 79 mg/dL (ref 70–99)
HCT: 38.1 % (ref 36.0–46.0)
HDL: 39 mg/dL — ABNORMAL LOW (ref >39)
Hemoglobin: 11.6 g/dL — ABNORMAL LOW (ref 12.0–15.0)
LDL Cholesterol: 62 mg/dL (ref 0–99)
Lymphocytes Relative: 18 % (ref 12–46)
Lymphs Abs: 2 10*3/uL (ref 0.7–4.0)
MCHC: 30.4 g/dL (ref 30.0–36.0)
MCV: 83.2 fL (ref 78.0–100.0)
Monocytes Absolute: 0.6 10*3/uL (ref 0.1–1.0)
Monocytes Relative: 5 % (ref 3–12)
Neutro Abs: 8.2 10*3/uL — ABNORMAL HIGH (ref 1.7–7.7)
Neutrophils Relative %: 74 % (ref 43–77)
Potassium: 4.4 meq/L (ref 3.5–5.3)
RBC: 4.58 M/uL (ref 3.87–5.11)
RDW: 17.5 % — ABNORMAL HIGH (ref 11.5–15.5)
Sodium: 140 meq/L (ref 135–145)
TSH: 0.316 microintl units/mL — ABNORMAL LOW (ref 0.350–4.500)
Total CHOL/HDL Ratio: 3.3
Triglycerides: 146 mg/dL (ref <150)
VLDL: 29 mg/dL (ref 0–40)
WBC: 11 10*3/uL — ABNORMAL HIGH (ref 4.0–10.5)

## 2010-05-01 ENCOUNTER — Encounter: Payer: Self-pay | Admitting: Internal Medicine

## 2010-05-01 ENCOUNTER — Ambulatory Visit (HOSPITAL_BASED_OUTPATIENT_CLINIC_OR_DEPARTMENT_OTHER)
Admission: RE | Admit: 2010-05-01 | Discharge: 2010-05-01 | Payer: Self-pay | Source: Home / Self Care | Admitting: Hematology & Oncology

## 2010-05-01 LAB — CBC WITH DIFFERENTIAL (CANCER CENTER ONLY)
BASO%: 0.6 % (ref 0.0–2.0)
EOS%: 2.6 % (ref 0.0–7.0)
HCT: 37 % (ref 34.8–46.6)
LYMPH#: 2.1 10*3/uL (ref 0.9–3.3)
MCHC: 32.4 g/dL (ref 32.0–36.0)
MONO#: 0.6 10*3/uL (ref 0.1–0.9)
NEUT#: 8.9 10*3/uL — ABNORMAL HIGH (ref 1.5–6.5)
NEUT%: 74.2 % (ref 39.6–80.0)
Platelets: 297 10*3/uL (ref 145–400)
RDW: 14 % (ref 10.5–14.6)
WBC: 12 10*3/uL — ABNORMAL HIGH (ref 3.9–10.0)

## 2010-05-01 LAB — TECHNOLOGIST REVIEW CHCC SATELLITE

## 2010-05-01 LAB — RETICULOCYTES (CHCC)
RBC.: 4.59 MIL/uL (ref 3.87–5.11)
Retic Ct Pct: 2.7 % (ref 0.4–3.1)

## 2010-05-01 LAB — CHCC SATELLITE - SMEAR

## 2010-05-09 ENCOUNTER — Encounter: Payer: Self-pay | Admitting: Internal Medicine

## 2010-05-09 ENCOUNTER — Ambulatory Visit: Payer: Self-pay | Admitting: Internal Medicine

## 2010-05-09 LAB — CONVERTED CEMR LAB
Blood in Urine, dipstick: NEGATIVE
Ketones, urine, test strip: NEGATIVE
Nitrite: NEGATIVE

## 2010-05-15 ENCOUNTER — Encounter: Payer: Self-pay | Admitting: Internal Medicine

## 2010-05-15 ENCOUNTER — Encounter (INDEPENDENT_AMBULATORY_CARE_PROVIDER_SITE_OTHER): Payer: Self-pay | Admitting: Surgery

## 2010-05-15 ENCOUNTER — Ambulatory Visit (HOSPITAL_COMMUNITY)
Admission: RE | Admit: 2010-05-15 | Discharge: 2010-05-16 | Payer: Self-pay | Source: Home / Self Care | Attending: Surgery | Admitting: Surgery

## 2010-05-15 HISTORY — PX: THYROIDECTOMY: SHX17

## 2010-05-22 ENCOUNTER — Inpatient Hospital Stay (HOSPITAL_COMMUNITY): Admission: EM | Admit: 2010-05-22 | Discharge: 2010-05-29 | Payer: Self-pay | Source: Home / Self Care

## 2010-05-23 ENCOUNTER — Encounter (INDEPENDENT_AMBULATORY_CARE_PROVIDER_SITE_OTHER): Payer: Self-pay | Admitting: General Surgery

## 2010-05-29 LAB — CALCIUM: Calcium: 7.8 mg/dL — ABNORMAL LOW (ref 8.4–10.5)

## 2010-06-04 ENCOUNTER — Encounter: Payer: Self-pay | Admitting: Internal Medicine

## 2010-06-11 ENCOUNTER — Encounter: Payer: Self-pay | Admitting: Internal Medicine

## 2010-06-11 ENCOUNTER — Ambulatory Visit: Payer: Self-pay | Admitting: Hematology & Oncology

## 2010-06-11 ENCOUNTER — Ambulatory Visit
Admission: RE | Admit: 2010-06-11 | Discharge: 2010-06-11 | Payer: Self-pay | Source: Home / Self Care | Attending: Internal Medicine | Admitting: Internal Medicine

## 2010-06-12 ENCOUNTER — Encounter: Payer: Self-pay | Admitting: Internal Medicine

## 2010-06-12 LAB — COMPREHENSIVE METABOLIC PANEL
ALT: 27 U/L (ref 0–35)
AST: 23 U/L (ref 0–37)
Albumin: 4.1 g/dL (ref 3.5–5.2)
Alkaline Phosphatase: 98 U/L (ref 39–117)
BUN: 15 mg/dL (ref 6–23)
CO2: 26 mEq/L (ref 19–32)
Calcium: 8.1 mg/dL — ABNORMAL LOW (ref 8.4–10.5)
Chloride: 100 mEq/L (ref 96–112)
Creatinine, Ser: 0.88 mg/dL (ref 0.40–1.20)
Glucose, Bld: 88 mg/dL (ref 70–99)
Potassium: 4 mEq/L (ref 3.5–5.3)
Sodium: 139 mEq/L (ref 135–145)
Total Bilirubin: 0.3 mg/dL (ref 0.3–1.2)
Total Protein: 7.2 g/dL (ref 6.0–8.3)

## 2010-06-12 LAB — CBC WITH DIFFERENTIAL (CANCER CENTER ONLY)
BASO#: 0.1 10*3/uL (ref 0.0–0.2)
BASO%: 0.7 % (ref 0.0–2.0)
EOS%: 3 % (ref 0.0–7.0)
Eosinophils Absolute: 0.4 10*3/uL (ref 0.0–0.5)
HCT: 37.2 % (ref 34.8–46.6)
HGB: 11.9 g/dL (ref 11.6–15.9)
LYMPH#: 2.3 10*3/uL (ref 0.9–3.3)
LYMPH%: 17.9 % (ref 14.0–48.0)
MCH: 25.4 pg — ABNORMAL LOW (ref 26.0–34.0)
MCHC: 32 g/dL (ref 32.0–36.0)
MCV: 79 fL — ABNORMAL LOW (ref 81–101)
MONO#: 0.6 10*3/uL (ref 0.1–0.9)
MONO%: 4.9 % (ref 0.0–13.0)
NEUT#: 9.4 10*3/uL — ABNORMAL HIGH (ref 1.5–6.5)
NEUT%: 73.5 % (ref 39.6–80.0)
Platelets: 341 10*3/uL (ref 145–400)
RBC: 4.68 10*6/uL (ref 3.70–5.32)
RDW: 15.2 % — ABNORMAL HIGH (ref 10.5–14.6)
WBC: 12.8 10*3/uL — ABNORMAL HIGH (ref 3.9–10.0)

## 2010-06-12 LAB — FERRITIN: Ferritin: 135 ng/mL (ref 10–291)

## 2010-06-12 LAB — RETICULOCYTES (CHCC)
ABS Retic: 98.3 10*3/uL (ref 19.0–186.0)
RBC.: 4.68 MIL/uL (ref 3.87–5.11)
Retic Ct Pct: 2.1 % (ref 0.4–3.1)

## 2010-06-12 LAB — CHCC SATELLITE - SMEAR

## 2010-06-14 ENCOUNTER — Ambulatory Visit (HOSPITAL_COMMUNITY)
Admission: RE | Admit: 2010-06-14 | Discharge: 2010-06-14 | Payer: Self-pay | Source: Home / Self Care | Attending: Internal Medicine | Admitting: Internal Medicine

## 2010-06-14 ENCOUNTER — Ambulatory Visit: Admission: RE | Admit: 2010-06-14 | Discharge: 2010-06-14 | Payer: Self-pay | Source: Home / Self Care

## 2010-06-16 NOTE — Discharge Summary (Signed)
[name], [name]               ACCOUNT NO.:  [id number]  MEDICAL RECORD NO.:  [id number]          PATIENT TYPE:  INP  LOCATION:  5153                         FACILITY:  [facility]  PHYSICIAN:  Velora Heckler, MD      DATE OF BIRTH:  November 29, 1955  DATE OF ADMISSION:  05/22/2010 DATE OF DISCHARGE:  05/29/2010                        DISCHARGE SUMMARY - REFERRING   ADMISSION DIAGNOSES: 1. Hypocalcemia, symptomatic, status post thyroidectomy. 2. Thyroid nodule with multifocal papillary carcinoma on pathology. 3. Hypersomnia with sleep apnea. 4. Asthma. 5. Hypertension. 6. Allergic rhinitis. 7. Status post tonsil and adenoidectomy, gastric bypass and     appendectomy.  DISCHARGE DIAGNOSES: 1. Hypocalcemia, symptomatic, status post thyroidectomy. 2. Thyroid nodule with multifocal papillary carcinoma on pathology. 3. Hypersomnia with sleep apnea. 4. Asthma. 5. Hypertension. 6. Allergic rhinitis. 7. Status post tonsil and adenoidectomy, gastric bypass and     appendectomy.  PROCEDURES:  Replacement of calcium.  BRIEF HISTORY:  The patient is a 55 year old white female who recently underwent total thyroidectomy on May 15, 2010.  She had been found to have a thyroid nodule and there was concern that it was cancerous. She is, subsequently since, discharged and developed tingling and leg cramps.  She was seen and was found to have a calcium of 5.5 and was admitted for management of her severe hypocalcemia.  She was seen on a daily basis and her calcium slowly improved.  By December 28, it was up to 7.1; December 30 at 8.1 and 8.2, December 31 it was 7.8 and 7.6; May 26, 2010 it was 8; May 27, 2010 it was 7.5; January 3 it was 7.9.  Despite the fact that she was up in the 7's and the 8's, she was still having intermittent symptoms.  She developed phlebitis secondary to the IV calcium replacement and had to have a PICC line placed on May 25, 2010.  Today, the patient's has  remained asymptomatic.  Her calcium is up to 7.8 and it was Dr. Ardine Eng opinion that she could be discharged home.  She will go home on her previous home meds and will continue the Tums calcium carbonate replacement, 6 tablets by mouth every 6 hours.  She will be seen back in our office by Dr. Gerrit Friends.  The date is being worked out by [facility] and they will call her.  We will recheck her calcium on Friday.  She has a prescription and will go and get it checked at the independent lab in the early morning.  She is scheduled to see oncology and Dr. Sharl Ma on June 11, 2010.  DISCHARGE MEDICATIONS: 1. Calcium elemental Tums, 6 tablets q.6 hours. 2. Accolate 1 tablet b.i.d. 3. B12 at 500 mcg 1 daily. 4. Claritin D 1 daily. 5. Diovan 160 mg daily. 6. DuoNebs b.i.d. 7. Lasix 40 mg daily. 8. Hydrochlorothiazide 25 mg daily. 9. Iron dextran 150 mg daily. 10.Levothyroxine 100 mcg 2 tablets daily. 11.Omeprazole-sodium bicarb 40/1100 one daily. 12.Symbicort 2 puffs b.i.d. 13.Ventolin inhalers q.6 hours p.r.n. 14.Vitamin C 1000 mg daily. 15.And D2 at 50,000 units daily.  CONDITION ON DISCHARGE:  Improved.  Eber Hong, P.A.   ______________________________ Velora Heckler, MD    WDJ/MEDQ  D:  05/29/2010  T:  05/29/2010  Job:  [id number]  cc:   Barbette Hair. Artist Pais, DO  Electronically Signed by Sherrie George P.A. on 05/30/2010 01:07:01 PM Electronically Signed by Darnell Level MD on 06/16/2010 09:53:04 AM

## 2010-06-25 ENCOUNTER — Other Ambulatory Visit (HOSPITAL_COMMUNITY): Payer: Self-pay | Admitting: Internal Medicine

## 2010-06-25 ENCOUNTER — Encounter (HOSPITAL_COMMUNITY)
Admission: RE | Admit: 2010-06-25 | Discharge: 2010-06-25 | Payer: Self-pay | Source: Home / Self Care | Attending: Internal Medicine | Admitting: Internal Medicine

## 2010-06-25 ENCOUNTER — Encounter: Payer: Self-pay | Admitting: Internal Medicine

## 2010-06-25 DIAGNOSIS — C73 Malignant neoplasm of thyroid gland: Secondary | ICD-10-CM

## 2010-06-25 NOTE — Assessment & Plan Note (Signed)
Summary: FOLLOW-UP PER PT/LD   Vital Signs:  Patient profile:   55 year old female Weight:      321.50 pounds BMI:     55.38 O2 Sat:      95 % on Room air Temp:     98.0 degrees F oral Pulse rate:   73 / minute Pulse rhythm:   regular Resp:     20 per minute BP sitting:   128 / 70  (right arm) Cuff size:   large  Vitals Entered By: Glendell Docker CMA (November 09, 2009 10:57 AM)  O2 Flow:  Room air CC: Rm 2- Follow up , asthma   Primary Care Provider:  Dondra Spry DO  CC:  Rm 2- Follow up  and asthma.  History of Present Illness:      This is a 55 year old female who presents with asthma.  The patient complains of history of diagnosed Asthma, but denies cough, shortness of breath, chest tightness, wheezing, and mucous production.  The patient also has the following associated problems: heartburn.  Previous effective treatment includes ICS + LABA.    c/o gum irritation.  no white plaques.  tried ovavig - no improvment  Allergies: 1)  ! Augmentin 2)  ! Pcn  Past History:  Past Medical History: Allergic rhinitis Asthma Hypertension   Hypothyroidism    Morbid obesity - S/P Gastric Bypass    Anemia - iron deficiency  Vit D deficiency      Past Surgical History: T&A Bunions Bilat. feet  Appendectomy   Gastric bypass 1976         Family History: Father with history of hypertension, diabetes, lung cancer, and asbestosis Mother with history of hypertension, hyperthyroidism, heart disease Brother with asthma         Social History: Occupation: works in Medical laboratory scientific officer  Single     Former Smoker - quit tobacco 12 years ago.  She was light smoker for 10 years.        Review of Systems      See HPI for Pulmonary and Cardiac review of systems.  Physical Exam  General:  alert and well-developed.   Lungs:  normal respiratory effort, normal breath sounds, and no wheezes.   Heart:  normal rate, regular rhythm, and no gallop.   Extremities:  trace left pedal edema  and trace right pedal edema.   Neurologic:  cranial nerves II-XII intact and gait normal.     Impression & Recommendations:  Problem # 1:  ASTHMA (ICD-493.90) Assessment Unchanged well controlled. rarely uses rescue inhaler.  decrease advair dose to 250/50  Her updated medication list for this problem includes:    Ventolin Hfa 108 (90 Base) Mcg/act Aers (Albuterol sulfate) .[name]... 2 puffs every 6 hours as needed    Duoneb 2.5-0.5 Mg/39ml Soln (Albuterol-ipratropium) .[name]... Twice weekly as needed    Albuterol Sulfate (2.5 Mg/53ml) 0.083% Nebu (Albuterol sulfate) ..... Use qid as needed    Accolate 20 Mg Tabs (Zafirlukast) ..... One by mouth bid    Advair Diskus 250-50 Mcg/dose Aepb (Fluticasone-salmeterol) ..... One dose two times a day  Complete Medication List: 1)  Hydrochlorothiazide 25 Mg Tabs (Hydrochlorothiazide) .... One by mouth once daily 2)  Levothyroxine Sodium 200 Mcg Tabs (Levothyroxine sodium) .... 2 tabs by mouth qd 3)  Omeprazole-sodium Bicarbonate 40-1100 Mg Caps (Omeprazole-sodium bicarbonate) .... One by mouth qd 4)  Claritin-d 12 Hour 5-120 Mg Tb12 (Loratadine-pseudoephedrine) 5)  Vitamin B-12 500 Mcg Tabs (Cyanocobalamin) .Marland KitchenMarland KitchenMarland Kitchen  Take 1 tablet by mouth once a day 6)  Ventolin Hfa 108 (90 Base) Mcg/act Aers (Albuterol sulfate) .... 2 puffs every 6 hours as needed 7)  Duoneb 2.5-0.5 Mg/40ml Soln (Albuterol-ipratropium) .... Twice weekly as needed 8)  Nu-iron 150 Mg Caps (Polysaccharide iron complex) .[name].. 1 by mouth once daily 9)  Vitamin D 25366 Unit Caps (Ergocalciferol) .... By mouth once weekly 10)  Diovan 160 Mg Tabs (Valsartan) .... One by mouth once daily 11)  Vitamin C Cr 1000 Mg Cr-tabs (Ascorbic acid) .... Take 1 tablet by mouth once a day 12)  Compressor/nebulizer Misc (Nebulizers) .... Dispense one nebulizer re:  493.90 13)  Furosemide 40 Mg Tabs (Furosemide) .... 1/2 to 1  by mouth once daily as needed 14)  Albuterol Sulfate (2.5 Mg/77ml) 0.083% Nebu (Albuterol  sulfate) .... Use qid as needed 15)  Accolate 20 Mg Tabs (Zafirlukast) .... One by mouth bid 16)  Advair Diskus 250-50 Mcg/dose Aepb (Fluticasone-salmeterol) .... One dose two times a day 17)  Desoximetasone 0.25 % Crea (Desoximetasone) .... Apply two times a day x 1 week 18)  Oravig 50 Mg Tabs (Miconazole) .... 50 mg to inside of mouth x 2 weeks   Patient Instructions: 1)  Please schedule a follow-up appointment in 6 months. 2)  BMP prior to visit, ICD-9:  401.9 3)  TSH prior to visit, ICD-9: 244.9 4)  Please return for lab work one (1) week before your next appointment.  Prescriptions: ADVAIR DISKUS 250-50 MCG/DOSE AEPB (FLUTICASONE-SALMETEROL) one dose two times a day  #1 month x 5   Entered and Authorized by:   D. Thomos Lemons DO   Signed by:   D. Thomos Lemons DO on 11/09/2009   Method used:   Electronically to        [facility] [address] (retail)       [phone]       [place], [place]  [address]       Ph: [phone]       Fax: [phone]   RxID:   [id number]

## 2010-06-25 NOTE — Assessment & Plan Note (Signed)
Summary: hurt back/dt   Vital Signs:  Patient profile:   55 year old female Height:      64 inches Weight:      328.25 pounds BMI:     56.55 O2 Sat:      96 % on Room air Temp:     97.0 degrees F oral Pulse rate:   72 / minute Pulse rhythm:   regular Resp:     20 per minute BP sitting:   126 / 76  (left arm) Cuff size:   large  Vitals Entered By: Glendell Docker CMA (September 19, 2009 10:49 AM)  O2 Flow:  Room air CC: Rm 3- Back Pain Pain Assessment Patient in pain? yes     Location: hip/ shoulder blades Intensity: 7 Type: dul/sharp Onset of pain  Constant   Primary Care Provider:  Dondra Spry DO  CC:  Rm 3- Back Pain.  History of Present Illness: 55 y/o white female with hx of morbid obesity c/o low back pain left sided low back and also pain in between shoulder blades left sided pain keep her up at night back pain feels like someone is punching her on that side take otc tylenol and aleve.  some improvement  with tylenol severity 8 out of 10   Preventive Screening-Counseling & Management  Alcohol-Tobacco     Smoking Status: quit  Allergies: 1)  ! Augmentin 2)  ! Pcn  Past History:  Past Medical History: Allergic rhinitis Asthma Hypertension  Hypothyroidism  Morbid obesity - S/P Gastric Bypass    Anemia - iron deficiency  Vit D deficiency      Past Surgical History: T&A Bunions Bilat. feet  Appendectomy  Gastric bypass 1976      Family History: Father with history of hypertension, diabetes, lung cancer, and asbestosis Mother with history of hypertension, hyperthyroidism, heart disease Brother with asthma       Social History: Occupation: works in Medical laboratory scientific officer  Single    Former Smoker - quit tobacco 12 years ago.  She was light smoker for 10 years.      Review of Systems       no urinary symptoms  Physical Exam  General:  alert and overweight-appearing.   Lungs:  normal respiratory effort and normal breath sounds.   Heart:   normal rate, regular rhythm, and no gallop.   Abdomen:  soft, non-tender, normal bowel sounds, and no masses.   Extremities:  trace left pedal edema and trace right pedal edema.   Neurologic:  cranial nerves II-XII intact and gait normal.     Impression & Recommendations:  Problem # 1:  LOW BACK PAIN, ACUTE (ICD-724.2)  acute left lower back pain.  probable strain vs spondylosis.  no improvement with OTC NSAIDs.  use muscle relaxer and tramadol as needed.  Patient advised to call office if symptoms persist or worsen.   Her updated medication list for this problem includes:    Metaxalone 800 Mg Tabs (Metaxalone) ..... One by mouth three times a day as needed low back pain    Tramadol Hcl 50 Mg Tabs (Tramadol hcl) ..... One by mouth two times a day as needed  Complete Medication List: 1)  Maxzide-25 37.5-25 Mg Tabs (Triamterene-hctz) .... Take 1 tablet by mouth once a day 2)  Levothyroxine Sodium 200 Mcg Tabs (Levothyroxine sodium) .... 2 tabs by mouth qd 3)  Omeprazole-sodium Bicarbonate 40-1100 Mg Caps (Omeprazole-sodium bicarbonate) .... One by mouth qd 4)  Claritin-d 12 Hour  5-120 Mg Tb12 (Loratadine-pseudoephedrine) 5)  Advair Diskus 500-50 Mcg/dose Misc (Fluticasone-salmeterol) .[name].. 1 puff  two times a day 6)  Vitamin B-12 500 Mcg Tabs (Cyanocobalamin) .... Take 1 tablet by mouth once a day 7)  Ventolin Hfa 108 (90 Base) Mcg/act Aers (Albuterol sulfate) .... 2 puffs every 6 hours as needed 8)  Duoneb 2.5-0.5 Mg/70ml Soln (Albuterol-ipratropium) .... Twice weekly as needed 9)  Nu-iron 150 Mg Caps (Polysaccharide iron complex) .[name].. 1 by mouth once daily 10)  Vitamin D 36644 Unit Caps (Ergocalciferol) .... By mouth once weekly 11)  Diovan 160 Mg Tabs (Valsartan) .... One by mouth once daily 12)  Vitamin C Cr 1000 Mg Cr-tabs (Ascorbic acid) .... Take 1 tablet by mouth once a day 13)  Compressor/nebulizer Misc (Nebulizers) .... Dispense one nebulizer re:  493.90 14)  Furosemide 40 Mg  Tabs (Furosemide) .... 1/2 to 1  by mouth once daily as needed 15)  Albuterol Sulfate (2.5 Mg/35ml) 0.083% Nebu (Albuterol sulfate) .... Use qid as needed 16)  Accolate 20 Mg Tabs (Zafirlukast) .... One by mouth bid 17)  Metaxalone 800 Mg Tabs (Metaxalone) .... One by mouth three times a day as needed low back pain 18)  Tramadol Hcl 50 Mg Tabs (Tramadol hcl) .... One by mouth two times a day as needed  Patient Instructions: 1)  Call our office if your symptoms do not  improve or gets worse. 2)  Schedule appt within 2 weeks if you back pain does not improve Prescriptions: TRAMADOL HCL 50 MG TABS (TRAMADOL HCL) one by mouth two times a day as needed  #30 x 0   Entered and Authorized by:   D. Thomos Lemons DO   Signed by:   D. Thomos Lemons DO on 09/19/2009   Method used:   Electronically to        [facility] [address] [place] (retail)       [phone]       [place], [place]  [address]       Ph: [phone]       Fax: [phone]   RxID:   [id number] METAXALONE 800 MG TABS (METAXALONE) one by mouth three times a day as needed low back pain  #30 x 0   Entered and Authorized by:   D. Thomos Lemons DO   Signed by:   D. Thomos Lemons DO on 09/19/2009   Method used:   Electronically to        [facility] [address] [place] (retail)       [phone]       [place], [place]  [address]       Ph: [phone]       Fax: [phone]   RxID:   [id number]   Current Allergies (reviewed today): ! AUGMENTIN ! PCN

## 2010-06-25 NOTE — Progress Notes (Signed)
Summary: REFILL OF ADVIAR   Phone Note Call from Patient Call back at Home Phone [phone]   Caller: PT LIVE Call For: [name]  Summary of Call: SHE WAS IN TO SEE DR Artist Pais IN [place].  DR Zendaya Groseclose HAS BEEN REFILLING THE ADVAIR FOR HER FOR SEVERAL YEARS.  PLEASE CALL IN HER ADVAIR  Initial call taken by: Roselle Locus,  July 02, 2009 2:23 PM    Prescriptions: ADVAIR DISKUS 500-50 MCG/DOSE  MISC (FLUTICASONE-SALMETEROL) 1 puff  two times a day  #3  month x 3   Entered and Authorized by:   D. Thomos Lemons DO   Signed by:   D. Thomos Lemons DO on 07/02/2009   Method used:   Electronically to        [facility] [address] (retail)       [phone]       [place], [place]  [address]       Ph: [phone]       Fax: [phone]   RxID:   [id number]

## 2010-06-25 NOTE — Progress Notes (Signed)
Summary: Pt returned call  Phone Note Outgoing Call   Summary of Call: LMOVM for pt to call back re:  test results Initial call taken by: D. Thomos Lemons DO,  February 07, 2010 5:44 PM  Follow-up for Phone Call        Pt returned your call. Call  back at [phone]. Nicki Guadalajara Fergerson CMA Duncan Dull)  February 11, 2010 5:22 PM   Additional Follow-up for Phone Call Additional follow up Details #1::        notified pt of thyroid u/s results pt is having less frequent symptoms I advised pt have thyroid nodule biopsy  she is having less throat tightness symptoms I doubt symptoms attributable to small thyroid nodule question angioedema.   I advised allergy referral Additional Follow-up by: D. Thomos Lemons DO,  February 11, 2010 6:08 PM  New Problems: THYROID NODULE, LEFT (ICD-241.0)   Additional Follow-up for Phone Call Additional follow up Details #2::    Pt scheduled for FNA  , [facility]    Sept  27  ,  Appt Dr Maple Hudson  , Oct 20th pt notified of both appt. Follow-up by: Darral Dash,  February 12, 2010 1:47 PM  New Problems: THYROID NODULE, LEFT (ICD-241.0)

## 2010-06-25 NOTE — Progress Notes (Signed)
Summary: Lab order  Phone Note Call from Patient   Caller: Patient Details for Reason: Lab order Summary of Call:  Pt is on her way to lab - needs orders sent Initial call taken by: Darral Dash,  April 30, 2010 10:55 AM  Follow-up for Phone Call        Pt is in the lab now. Please advise what labs pt needs to have. Nicki Guadalajara Fergerson CMA Duncan Dull)  April 30, 2010 11:17 AM   Additional Follow-up for Phone Call Additional follow up Details #1::        BMP prior to visit, ICD-9:  401.9 Lipid Panel prior to visit, ICD-9:  401.9 TSH prior to visit, ICD-9: 244.9 CBC - 285.9 High sensitivity CRP :  401.9   Additional Follow-up by: D. Thomos Lemons DO,  April 30, 2010 1:19 PM    Additional Follow-up for Phone Call Additional follow up Details #2::    Order placed and faxed to the lab. Nicki Guadalajara Fergerson CMA Duncan Dull)  April 30, 2010 2:43 PM

## 2010-06-25 NOTE — Assessment & Plan Note (Signed)
Summary: 4 MONTH FOLLOW UP/[facility]   Vital Signs:  Patient profile:   55 year old female Height:      64 inches Weight:      321 pounds BMI:     55.30 O2 Sat:      95 % on Room air Temp:     97.8 degrees F oral Pulse rate:   71 / minute Pulse rhythm:   regular Resp:     18 per minute BP sitting:   110 / 72  (right arm) Cuff size:   Thigh  Vitals Entered By: Glendell Docker CMA (Oct 10, 2009 10:56 AM)  O2 Flow:  Room air CC: Rm 3- Follow up disease management, Rash   Primary Care Provider:  Dondra Spry DO  CC:  Rm 3- Follow up disease management and Rash.  History of Present Illness:  Rash      This is a 55 year old woman who presents with Rash.  The patient reports macules, but denies pustules and blisters.  The rash is located on the chest, right arm, and left arm.  The rash is worse with sun exposure  asthma - stable.  no recent exacerbations.  using zegerid as directed  Allergies: 1)  ! Augmentin 2)  ! Pcn  Past History:  Past Medical History: Allergic rhinitis Asthma Hypertension  Hypothyroidism   Morbid obesity - S/P Gastric Bypass    Anemia - iron deficiency  Vit D deficiency      Past Surgical History: T&A Bunions Bilat. feet  Appendectomy   Gastric bypass 1976      Family History: Father with history of hypertension, diabetes, lung cancer, and asbestosis Mother with history of hypertension, hyperthyroidism, heart disease Brother with asthma        Social History: Occupation: works in Medical laboratory scientific officer  Single    Former Smoker - quit tobacco 12 years ago.  She was light smoker for 10 years.       Physical Exam  General:  alert and overweight-appearing.   Lungs:  normal respiratory effort and normal breath sounds.   Heart:  normal rate, regular rhythm, and no gallop.   Extremities:  trace left pedal edema and trace right pedal edema.   Skin:  macular rash over arms and chest (sun exposed areas)   Impression & Recommendations:  Problem #  1:  DERMATITIS DUE TO SOLAR RADIATION (ICD-692.79)  Her updated medication list for this problem includes:    Desoximetasone 0.25 % Crea (Desoximetasone) .[name]... Apply two times a day x 1 week  Discussed avoidance of triggers and symptomatic treatment.   Problem # 2:  ASTHMA (ICD-493.90) Assessment: Unchanged stable.    continue maintenace inhalers and PPI  The following medications were removed from the medication list:    Advair Diskus 500-50 Mcg/dose Misc (Fluticasone-salmeterol) .[name]... 1 puff  two times a day Her updated medication list for this problem includes:    Ventolin Hfa 108 (90 Base) Mcg/act Aers (Albuterol sulfate) .[name]... 2 puffs every 6 hours as needed    Duoneb 2.5-0.5 Mg/30ml Soln (Albuterol-ipratropium) .[name]... Twice weekly as needed    Albuterol Sulfate (2.5 Mg/42ml) 0.083% Nebu (Albuterol sulfate) ..... Use qid as needed    Accolate 20 Mg Tabs (Zafirlukast) ..... One by mouth bid    Symbicort 160-4.5 Mcg/act Aero (Budesonide-formoterol fumarate) .[name]... 2 puffs two times a day  Complete Medication List: 1)  Hydrochlorothiazide 25 Mg Tabs (Hydrochlorothiazide) .... One by mouth once daily 2)  Levothyroxine Sodium  200 Mcg Tabs (Levothyroxine sodium) .... 2 tabs by mouth qd 3)  Omeprazole-sodium Bicarbonate 40-1100 Mg Caps (Omeprazole-sodium bicarbonate) .... One by mouth qd 4)  Claritin-d 12 Hour 5-120 Mg Tb12 (Loratadine-pseudoephedrine) 5)  Vitamin B-12 500 Mcg Tabs (Cyanocobalamin) .... Take 1 tablet by mouth once a day 6)  Ventolin Hfa 108 (90 Base) Mcg/act Aers (Albuterol sulfate) .... 2 puffs every 6 hours as needed 7)  Duoneb 2.5-0.5 Mg/85ml Soln (Albuterol-ipratropium) .... Twice weekly as needed 8)  Nu-iron 150 Mg Caps (Polysaccharide iron complex) .[name].. 1 by mouth once daily 9)  Vitamin D 16109 Unit Caps (Ergocalciferol) .... By mouth once weekly 10)  Diovan 160 Mg Tabs (Valsartan) .... One by mouth once daily 11)  Vitamin C Cr 1000 Mg Cr-tabs (Ascorbic acid) .... Take  1 tablet by mouth once a day 12)  Compressor/nebulizer Misc (Nebulizers) .... Dispense one nebulizer re:  493.90 13)  Furosemide 40 Mg Tabs (Furosemide) .... 1/2 to 1  by mouth once daily as needed 14)  Albuterol Sulfate (2.5 Mg/57ml) 0.083% Nebu (Albuterol sulfate) .... Use qid as needed 15)  Accolate 20 Mg Tabs (Zafirlukast) .... One by mouth bid 16)  Symbicort 160-4.5 Mcg/act Aero (Budesonide-formoterol fumarate) .... 2 puffs two times a day 17)  Desoximetasone 0.25 % Crea (Desoximetasone) .... Apply two times a day x 1 week 18)  Oravig 50 Mg Tabs (Miconazole) .... 50 mg to inside of mouth x 2 weeks  Patient Instructions: 1)  Keep your next appointment Prescriptions: DESOXIMETASONE 0.25 % CREA (DESOXIMETASONE) apply two times a day x 1 week  #30 grams x 1   Entered and Authorized by:   D. Thomos Lemons DO   Signed by:   D. Thomos Lemons DO on 10/10/2009   Method used:   Electronically to        [facility] [address] [place] (retail)       [phone]       [place], [place]  [address]       Ph: [phone]       Fax: [phone]   RxID:   [id number] SYMBICORT 160-4.5 MCG/ACT AERO (BUDESONIDE-FORMOTEROL FUMARATE) 2 puffs two times a day  #1 x 2   Entered and Authorized by:   D. Thomos Lemons DO   Signed by:   D. Thomos Lemons DO on 10/10/2009   Method used:   Electronically to        [facility] [address] [place] (retail)       [phone]       [place], [place]  [address]       Ph: [phone]       Fax: [phone]   RxID:   [id number]   Current Allergies (reviewed today): ! AUGMENTIN ! PCN

## 2010-06-25 NOTE — Letter (Signed)
[facility]   Imported By: Lanelle Bal 08/31/2009 09:16:29  _____________________________________________________________________  External Attachment:    Type:   Image     Comment:   External Document

## 2010-06-25 NOTE — Progress Notes (Signed)
Summary: Medication Change/Back order  Phone Note From Pharmacy   Caller: [facility] [address] Summary of Call: pharmacy sent fax stating Maxide 25-37.5 is on national backorder  with no release date.  They would like to know if there is something else that it could be replaced with Initial call taken by: Glendell Docker CMA,  Oct 05, 2009 11:29 AM  Follow-up for Phone Call        replace with hctz.  please arrange BMET within 2 weeks of medication change Follow-up by: D. Thomos Lemons DO,  Oct 05, 2009 11:51 AM  Additional Follow-up for Phone Call Additional follow up Details #1::        patient has been advised per Dr Artist Pais instructions. She state she wiil call back for lab work Additional Follow-up by: Glendell Docker CMA,  Oct 05, 2009 11:58 AM    New/Updated Medications: HYDROCHLOROTHIAZIDE 25 MG TABS (HYDROCHLOROTHIAZIDE) one by mouth once daily Prescriptions: HYDROCHLOROTHIAZIDE 25 MG TABS (HYDROCHLOROTHIAZIDE) one by mouth once daily  #30 x 3   Entered and Authorized by:   D. Thomos Lemons DO   Signed by:   D. Thomos Lemons DO on 10/05/2009   Method used:   Electronically to        [facility] [address] (retail)       [phone]       [place], [place]  [address]       Ph: [phone]       Fax: [phone]   RxID:   [id number]

## 2010-06-25 NOTE — Assessment & Plan Note (Signed)
Summary: CK  THROID  /   FLU SHOT/HEA   Vital Signs:  Patient profile:   55 year old female Weight:      323 pounds BMI:     55.64 O2 Sat:      95 % on Room air Temp:     97.7 degrees F oral Pulse rate:   71 / minute Pulse rhythm:   regular Resp:     18 per minute BP sitting:   104 / 70  (right arm) Cuff size:   Thigh  Vitals Entered By: Glendell Docker CMA (February 05, 2010 11:11 AM)  O2 Flow:  Room air CC: abnormal throat sensation Is Patient Diabetic? No Pain Assessment Patient in pain? no      Comments c/o throat pressure,  hot all over, use of ice  pack to cool down   Primary Care Provider:  D. Thomos Lemons DO  CC:  abnormal throat sensation.  History of Present Illness: 55 y/o white female with hx of asthma and hypothyroidism c/o intermittent sensation of throat tightness no obvious environmental trigger noted she has hx of morbid obesity and gerd but she has been using her PPI regularly  she also c/o intermittent hot flashes  Preventive Screening-Counseling & Management  Alcohol-Tobacco     Smoking Status: quit  Allergies: 1)  ! Augmentin 2)  ! Pcn  Past History:  Past Medical History: Allergic rhinitis Asthma Hypertension    Hypothyroidism     Morbid obesity - S/P Gastric Bypass    Anemia - iron deficiency  Vit D deficiency      Past Surgical History: T&A  Bunions Bilat. feet  Appendectomy   Gastric bypass 1976         Family History: Father with history of hypertension, diabetes, lung cancer, and asbestosis Mother with history of hypertension, hyperthyroidism, heart disease Brother with asthma          Review of Systems       periods are irregular  Physical Exam  General:  alert and overweight-appearing.   Mouth:  pharynx pink and moist and pharyngeal crowding.   Neck:  supple and no masses.   Lungs:  normal respiratory effort and normal breath sounds.   Heart:  normal rate, regular rhythm, and no gallop.     Impression &  Recommendations:  Problem # 1:  HOT FLASHES (ICD-627.2) consider gabapentin vs effexor use  Orders: T- * Misc. Laboratory test [phone])  Problem # 2:  THROAT PAIN (ICD-784.1)  pt experiencing throat tightness.  she had similar incident in the past and it was related to "thyroid problem" she has been taking zegerid regularly check TFTs and thyroid u/s if normal - we discussed ENT referral vs allergy work up Orders: T-TSH ([id number]) T-T4, Free [phone]) T- * Misc. Laboratory test [phone]) Ultrasound (Ultrasound)  Complete Medication List: 1)  Hydrochlorothiazide 25 Mg Tabs (Hydrochlorothiazide) .... One by mouth once daily 2)  Levothyroxine Sodium 200 Mcg Tabs (Levothyroxine sodium) .... 2 tabs by mouth qd 3)  Omeprazole-sodium Bicarbonate 40-1100 Mg Caps (Omeprazole-sodium bicarbonate) .... One by mouth qd 4)  Claritin-d 12 Hour 5-120 Mg Tb12 (Loratadine-pseudoephedrine) 5)  Vitamin B-12 500 Mcg Tabs (Cyanocobalamin) .... Take 1 tablet by mouth once a day 6)  Ventolin Hfa 108 (90 Base) Mcg/act Aers (Albuterol sulfate) .... 2 puffs every 6 hours as needed 7)  Duoneb 2.5-0.5 Mg/84ml Soln (Albuterol-ipratropium) .... Twice weekly as needed 8)  Nu-iron 150 Mg Caps (Polysaccharide iron complex) .Marland KitchenMarland KitchenMarland Kitchen 1  by mouth once daily 9)  Vitamin D 29562 Unit Caps (Ergocalciferol) .... By mouth once weekly 10)  Diovan 160 Mg Tabs (Valsartan) .... One by mouth once daily 11)  Vitamin C Cr 1000 Mg Cr-tabs (Ascorbic acid) .... Take 1 tablet by mouth once a day 12)  Compressor/nebulizer Misc (Nebulizers) .... Dispense one nebulizer re:  493.90 13)  Furosemide 40 Mg Tabs (Furosemide) .... 1/2 to 1  by mouth once daily as needed 14)  Albuterol Sulfate (2.5 Mg/82ml) 0.083% Nebu (Albuterol sulfate) .... Use qid as needed 15)  Accolate 20 Mg Tabs (Zafirlukast) .... One by mouth bid 16)  Advair Diskus 250-50 Mcg/dose Aepb (Fluticasone-salmeterol) .... One dose two times a day 17)  Desoximetasone 0.25 %  Crea (Desoximetasone) .... Apply two times a day x 1 week  Other Orders: T-Basic Metabolic Panel [phone])  Patient Instructions: 1)  Our office will contact you re:  blood test and thyroid ultrasound results 2)  Call our office if your hot flashes and throat sensation gets worse.  Current Allergies (reviewed today): ! AUGMENTIN ! PCN

## 2010-06-25 NOTE — Consult Note (Signed)
Summary: [facility]  [facility]   Imported By: Lanelle Bal 07/16/2009 08:42:25  _____________________________________________________________________  External Attachment:    Type:   Image     Comment:   External Document

## 2010-06-25 NOTE — Progress Notes (Signed)
Summary: Medication Status Update/ Blood work  Phone Note Outgoing Call   Call placed by: Glendell Docker CMA,  October 24, 2009 10:03 AM Call placed to: Patient Summary of Call: call placed to patient to check status medication. Need to know if patient has started the HCTZ and if so she will need to have blood work before her June 17th appointment Initial call taken by: Glendell Docker CMA,  October 24, 2009 10:04 AM  Follow-up for Phone Call        call returned form patient, she states she has been taking the Hctz for the past 7 days and will have the blood work drawn next week. She was informed that she may go straight to the lab for blood draw, lab order has been placed Follow-up by: Glendell Docker CMA,  October 24, 2009 4:17 PM

## 2010-06-25 NOTE — Progress Notes (Signed)
Summary: needs rx. for new nebulizer  Phone Note Call from Patient   Caller: Patient Details for Reason: needs a Rx. for [employer] of Call: pt. needs a Rx. faxed to [employer] [phone] ( Fax#) ATT: Britta Mccreedy So she can get new Nebulizer,Any problems call Call pt. [phone] Initial call taken by: Michaelle Copas,  July 30, 2009 9:02 AM  Follow-up for Phone Call        Call to patient--pt  has rec'd  neb and medication albuterol  2.5mg   Follow-up by: Darral Dash,  July 31, 2009 4:26 PM

## 2010-06-25 NOTE — Assessment & Plan Note (Signed)
Summary: bronchitis/mhf   Vital Signs:  Patient profile:   55 year old female Height:      64 inches Weight:      319.75 pounds BMI:     55.08 O2 Sat:      96 % on Room air Temp:     98.9 degrees F oral Pulse rate:   73 / minute Pulse rhythm:   regular Resp:     16 per minute BP sitting:   112 / 66  (right arm) Cuff size:   Thigh  Vitals Entered By: Glendell Docker CMA (January 10, 2010 11:35 AM)  O2 Flow:  Room air CC: Respiratroy discomfort Is Patient Diabetic? No Pain Assessment Patient in pain? no        Primary Care Jolana Runkles:  Dondra Spry DO  CC:  Respiratroy discomfort.  History of Present Illness: 55 y/o white female with hx of asthma presents with falre onset sundary night runny nose, post nasal gtt phlegm is green using rescue inhaler more often   Preventive Screening-Counseling & Management  Alcohol-Tobacco     Smoking Status: quit  Allergies: 1)  ! Augmentin 2)  ! Pcn  Past History:  Past Medical History: Allergic rhinitis Asthma Hypertension    Hypothyroidism    Morbid obesity - S/P Gastric Bypass    Anemia - iron deficiency  Vit D deficiency      Physical Exam  General:  alert and overweight-appearing.   Mouth:  pharynx pink and moist.   Lungs:  normal respiratory effort.  scattered faint exp wheeze Heart:  normal rate, regular rhythm, and no gallop.     Impression & Recommendations:  Problem # 1:  ASTHMA, WITH ACUTE EXACERBATION (ICD-493.92) Assessment Deteriorated pt with acute flare.  no severe wheezing.  I would like to avoid prednisone if poss.  tx with ceftin and azithro.  Patient advised to call office if symptoms persist or worsen.  Her updated medication list for this problem includes:    Ventolin Hfa 108 (90 Base) Mcg/act Aers (Albuterol sulfate) .[name]... 2 puffs every 6 hours as needed    Duoneb 2.5-0.5 Mg/53ml Soln (Albuterol-ipratropium) .[name]... Twice weekly as needed    Albuterol Sulfate (2.5 Mg/43ml) 0.083% Nebu  (Albuterol sulfate) ..... Use qid as needed    Accolate 20 Mg Tabs (Zafirlukast) ..... One by mouth bid    Advair Diskus 250-50 Mcg/dose Aepb (Fluticasone-salmeterol) ..... One dose two times a day  Pulmonary Functions Reviewed: O2 sat: 96 (01/10/2010)  Complete Medication List: 1)  Hydrochlorothiazide 25 Mg Tabs (Hydrochlorothiazide) .... One by mouth once daily 2)  Levothyroxine Sodium 200 Mcg Tabs (Levothyroxine sodium) .... 2 tabs by mouth qd 3)  Omeprazole-sodium Bicarbonate 40-1100 Mg Caps (Omeprazole-sodium bicarbonate) .... One by mouth qd 4)  Claritin-d 12 Hour 5-120 Mg Tb12 (Loratadine-pseudoephedrine) 5)  Vitamin B-12 500 Mcg Tabs (Cyanocobalamin) .... Take 1 tablet by mouth once a day 6)  Ventolin Hfa 108 (90 Base) Mcg/act Aers (Albuterol sulfate) .... 2 puffs every 6 hours as needed 7)  Duoneb 2.5-0.5 Mg/106ml Soln (Albuterol-ipratropium) .... Twice weekly as needed 8)  Nu-iron 150 Mg Caps (Polysaccharide iron complex) .[name].. 1 by mouth once daily 9)  Vitamin D 42595 Unit Caps (Ergocalciferol) .... By mouth once weekly 10)  Diovan 160 Mg Tabs (Valsartan) .... One by mouth once daily 11)  Vitamin C Cr 1000 Mg Cr-tabs (Ascorbic acid) .... Take 1 tablet by mouth once a day 12)  Compressor/nebulizer Misc (Nebulizers) .... Dispense one nebulizer re:  493.90 13)  Furosemide 40 Mg Tabs (Furosemide) .... 1/2 to 1  by mouth once daily as needed 14)  Albuterol Sulfate (2.5 Mg/22ml) 0.083% Nebu (Albuterol sulfate) .... Use qid as needed 15)  Accolate 20 Mg Tabs (Zafirlukast) .... One by mouth bid 16)  Advair Diskus 250-50 Mcg/dose Aepb (Fluticasone-salmeterol) .... One dose two times a day 17)  Desoximetasone 0.25 % Crea (Desoximetasone) .... Apply two times a day x 1 week 18)  Cefuroxime Axetil 500 Mg Tabs (Cefuroxime axetil) .... One by mouth two times a day 19)  Azithromycin 250 Mg Tabs (Azithromycin) .... 2 tabs on day one, then one by mouth once daily x 4 days  Patient Instructions: 1)   Call our office if your symptoms do not  improve or gets worse. Prescriptions: AZITHROMYCIN 250 MG TABS (AZITHROMYCIN) 2 tabs on day one, then one by mouth once daily x 4 days  #6 x 0   Entered and Authorized by:   D. Thomos Lemons DO   Signed by:   D. Thomos Lemons DO on 01/10/2010   Method used:   Electronically to        [facility] [address] (retail)       [phone]       [place], [place]  [address]       Ph: [phone]       Fax: [phone]   RxID:   [id number] CEFUROXIME AXETIL 500 MG TABS (CEFUROXIME AXETIL) one by mouth two times a day  #20 x 0   Entered and Authorized by:   D. Thomos Lemons DO   Signed by:   D. Thomos Lemons DO on 01/10/2010   Method used:   Electronically to        [facility] [address] [place] (retail)       [phone]       [place], [place]  [address]       Ph: [phone]       Fax: [phone]   RxID:   [id number]   Current Allergies (reviewed today): ! AUGMENTIN ! PCN   Preventive Care Screening  Mammogram:    Date:  10/24/2009    Results:  normal   Pap Smear:    Date:  10/24/2009    Results:  normal

## 2010-06-25 NOTE — Progress Notes (Signed)
Summary: Maxide Refill  Phone Note Refill Request Message from:  Fax from Pharmacy on July 10, 2009 10:40 AM  Refills Requested: Medication #1:  MAXZIDE-25 37.5-25 MG  TABS Take 1 tablet by mouth once a day   Dosage confirmed as above?Dosage Confirmed   Brand Name Necessary? No   Supply Requested: 3 months   Last Refilled: 04/03/2009  Method Requested: Electronic Next Appointment Scheduled: 09-11-2009 1030 Dr Artist Pais  Initial call taken by: Roselle Locus,  July 10, 2009 10:40 AM  Follow-up for Phone Call        Rx completed in Dr. Tiajuana Amass Follow-up by: Glendell Docker CMA,  July 10, 2009 3:47 PM    Prescriptions: MAXZIDE-25 37.5-25 MG  TABS (TRIAMTERENE-HCTZ) Take 1 tablet by mouth once a day  #90 x 0   Entered by:   Glendell Docker CMA   Authorized by:   D. Thomos Lemons DO   Signed by:   Glendell Docker CMA on 07/10/2009   Method used:   Electronically to        [facility] [address] [place] (retail)       [phone]       [place], [place]  [address]       Ph: [phone]       Fax: [phone]   RxID:   [id number]

## 2010-06-25 NOTE — Miscellaneous (Signed)
Summary: Orders Update pft charges  Clinical Lists Changes  Orders: Added new Service order of Carbon Monoxide diffusing w/capacity (94720) - Signed Added new Service order of Lung Volumes (94240) - Signed Added new Service order of Spirometry (Pre & Post) (94060) - Signed 

## 2010-06-25 NOTE — Assessment & Plan Note (Signed)
Summary: throat pain/cb   Primary Provider/Referring Provider:  Dondra Spry DO  CC:  Pulmonary Consult-Dr. Artist Pais; Found out last week that "thyroid is messed up" and will be seeing a surgeon on 04-03-10.[name]  History of Present Illness: March 14, 2010- 55yoF asking allergy evaluation on kind request of Dr Artist Pais. She reports allergy, asthma and bronchitis all her life.. Asthma has been controlled in the past year, using a nebulizer in spring and fall weather. Asvair helped but cause irritation in mouth and she has been changed to try Symbicort. This stil irritates mouth. She expects daily wheeze, no sputum. Needs rescue inhaler about once/ week. Allergy skin testing x 3 in past and on allergy vaccine three separate ties while in [place].  Occasional reflux- now controlled.  Lives in house [address] years old- crawl space, gas heat, carpet. Denies mold, pets, smokers. She has had hoarseness with abnormal cytology and is to have surgical consult in November. Part of concern is that she wants stability of her asthma should she need surgery. IgE 08/13/06: elevated 774.9.   Asthma History    Initial Asthma Severity Rating:    Age range: 12+ years    Symptoms: 0-2 days/week    Nighttime Awakenings: 0-2/month    Interferes w/ normal activity: no limitations    SABA use (not for EIB): 0-2 days/week    Asthma Severity Assessment: Intermittent   Preventive Screening-Counseling & Management  Alcohol-Tobacco     Smoking Status: quit     Packs/Day: 0.5     Year Quit: 1995  Current Medications (verified): 1)  Hydrochlorothiazide 25 Mg Tabs (Hydrochlorothiazide) .... One By Mouth Once Daily 2)  Levothyroxine Sodium 200 Mcg Tabs (Levothyroxine Sodium) .... 2 Tabs By Mouth Qd 3)  Omeprazole-Sodium Bicarbonate 40-1100 Mg Caps (Omeprazole-Sodium Bicarbonate) .... One By Mouth Qd 4)  Claritin-D 12 Hour 5-120 Mg  Tb12 (Loratadine-Pseudoephedrine) 5)  Vitamin B-12 500 Mcg  Tabs (Cyanocobalamin) .... Take 1  Tablet By Mouth Once A Day 6)  Ventolin Hfa 108 (90 Base) Mcg/act Aers (Albuterol Sulfate) .... 2 Puffs Every 6 Hours As Needed 7)  Duoneb 2.5-0.5 Mg/74ml  Soln (Albuterol-Ipratropium) .... Twice Weekly As Needed 8)  Nu-Iron 150 Mg  Caps (Polysaccharide Iron Complex) .[name].. 1 By Mouth Once Daily 9)  Vitamin D 16109 Unit  Caps (Ergocalciferol) .... By Mouth Once Weekly 10)  Diovan 160 Mg  Tabs (Valsartan) .... One By Mouth Once Daily 11)  Vitamin C Cr 1000 Mg Cr-Tabs (Ascorbic Acid) .... Take 1 Tablet By Mouth Once A Day 12)  Compressor/nebulizer  Misc (Nebulizers) .... Dispense One Nebulizer Re:  493.90 13)  Furosemide 40 Mg Tabs (Furosemide) .... 1/2 To 1  By Mouth Once Daily As Needed 14)  Albuterol Sulfate (2.5 Mg/11ml) 0.083% Nebu (Albuterol Sulfate) .... Use Qid As Needed 15)  Accolate 20 Mg Tabs (Zafirlukast) .... One By Mouth Bid 16)  Advair Diskus 250-50 Mcg/dose Aepb (Fluticasone-Salmeterol) .... One Dose Two Times A Day  Allergies (verified): 1)  ! Augmentin 2)  ! Pcn  Past History:  Past Surgical History: Last updated: 02/05/2010 T&A  Bunions Bilat. feet  Appendectomy   Gastric bypass 1976         Family History: Last updated: 03/14/2010 Father with history of hypertension, diabetes, lung cancer, and asbestosis- died of MI Mother with history of hypertension, hyperthyroidism, heart disease- died of MI Brother with asthma          Social History: Last updated: 03/14/2010 Occupation: works in Animator  industry - Tree surgeon Single     Former Smoker - quit tobacco 12 years ago.  She was light smoker for 10 years.        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  She was light smoker for 10 years.      Packs/Day:  0.5  Review of Systems      See HPI       The patient complains of irregular heartbeats and hand/feet swelling.  The patient denies shortness of breath with activity, shortness of breath at rest, productive cough, non-productive cough, coughing up blood, chest pain, acid heartburn, indigestion, loss of appetite, weight change, abdominal pain, difficulty swallowing, sore throat, tooth/dental problems, headaches, nasal congestion/difficulty breathing through nose, sneezing, itching, ear ache, anxiety, depression, joint stiffness or pain, rash, change in color of mucus, and fever.    Vital Signs:  Patient profile:   55 year old female Height:      64 inches Weight:      327.50 pounds BMI:     56.42 O2 Sat:      97 % on Room air Pulse rate:   87 / minute BP sitting:   142 / 76  (left arm) Cuff size:   large  Vitals Entered By: Reynaldo Minium CMA (March 14, 2010 2:53 PM)  O2 Flow:  Room air CC: Pulmonary Consult-Dr. Artist Pais; Found out last week that "thyroid is messed up" and will be seeing a surgeon on 04-03-10.   Physical Exam  Additional Exam:  General: A/Ox3; pleasant and cooperative, NAD, SKIN: no rash, lesions NODES: no lymphadenopathy HEENT: [place]/AT, EOM- WNL, Conjuctivae- clear, PERRLA, TM-WNL, Nose- clear, Throat- clear and wnl, hoarse, mucosa and gums reddened NECK: Supple w/ fair ROM, JVD- none, normal carotid impulses w/o bruits Thyroid- normal to palpation CHEST: Clear to P&A, no cough or wheeze HEART: RRR, no m/g/r heard ABDOMEN: Soft and nl; nml bowel  sounds; no organomegaly or masses noted. Overweight ZOX:WRUE, nl pulses, trace edema, negative Homan's NEURO: Grossly intact to observation      Impression & Recommendations:  Problem # 1:  ASTHMA (ICD-493.90) Long hx of asthma. Xolair was considered and she wanted to wait in 2008, but we will recheck allergy/ IgE status now. We are anticipating laryngeal  surgery and for preop assessment will get PFT and CXR now. She is very hoarse and has a mucositis which may be part of the same process. She says this was called "allergy " after mucosal bx, but I wonder about thrush and will give her a spacer for her Symbicort. It may also help to use her LABA/ICS before she eats to assist with mouth clearing.   Problem # 2:  ALLERGIC RHINITIS (ICD-477.9)  She asks help for pressure headache she relates to her sinuses. Has been  taking clairtin and is not currently bothered by drainage or sneezing. We will add a decongestant. We can come back to this problem as needed with seasonal  changes after her surgery.   Problem # 3:  GERD (ICD-530.81) This is currently controlled.Not sure if it contributed to vocal cord irritation in the past.  Her updated medication list for this problem includes:    Omeprazole-sodium Bicarbonate 40-1100 Mg Caps (Omeprazole-sodium bicarbonate) ..... One by mouth qd  Medications Added to Medication List This Visit: 1)  Aerochamber Spacer   Other Orders: Consultation Level IV (04540) T-2 View CXR (71020TC) T-Allergy Profile [place], [place], [place], [place], [place] [phone]) Misc. Referral (Misc. Ref)  Patient Instructions: 1)  Please schedule a follow-up appointment in 2 months 2)  Script/ aerochamber spacer 3)  Try using your symbicort with a spacer and before you eat so that helps clear the steroid off the lining of your mouth 4)  A chest x-ray has been recommended.  Your imaging study may require preauthorization.  5)  See PCC to schedule PFT 6)  Lab Prescriptions: AEROCHAMBER  SPACER   #1 x 0   Entered and Authorized by:   Waymon Budge MD   Signed by:   Waymon Budge MD on 03/14/2010   Method used:   Print then Give to Patient   RxID:   [id number]

## 2010-06-25 NOTE — Letter (Signed)
[facility]   Imported By: Lanelle Bal 11/23/2009 08:13:06  _____________________________________________________________________  External Attachment:    Type:   Image     Comment:   External Document

## 2010-06-25 NOTE — Progress Notes (Signed)
  Phone Note Outgoing Call   Summary of Call: informed pt of thyroid nodule biopsy results I suggest referral to Dr. Silvano Rusk for thyroid surgery Initial call taken by: D. Thomos Lemons DO,  February 25, 2010 12:08 PM  Follow-up for Phone Call        Appt  Dr Gerrit Friends  @ [facility]   November 9 , spoke with pt appt confirmed    Follow-up by: Darral Dash,  February 26, 2010 10:48 AM

## 2010-06-27 ENCOUNTER — Ambulatory Visit (HOSPITAL_COMMUNITY)
Admission: RE | Admit: 2010-06-27 | Discharge: 2010-06-27 | Disposition: A | Discharge status: Home / Self Care | Payer: No Typology Code available for payment source | Source: Ambulatory Visit | Attending: Internal Medicine | Admitting: Internal Medicine

## 2010-06-27 DIAGNOSIS — C73 Malignant neoplasm of thyroid gland: Secondary | ICD-10-CM

## 2010-06-27 MED ORDER — SODIUM IODIDE I 131 CAPSULE
100.0000 | Freq: Once | INTRAVENOUS | Status: AC | PRN
  Administered 2010-06-27: 104.4 via ORAL

## 2010-06-27 NOTE — Letter (Signed)
Summary: [facility]  [facility]   Imported By: Lanelle Bal 06/06/2010 11:59:07  _____________________________________________________________________  External Attachment:    Type:   Image     Comment:   External Document

## 2010-06-27 NOTE — Assessment & Plan Note (Signed)
Summary: 2 MONTH RETURN/[facility]   Primary Provider/Referring Provider:  Dondra Spry DO   History of Present Illness: History of Present Illness: March 14, 2010- 54yoF asking allergy evaluation on kind request of Dr Artist Pais. She reports allergy, asthma and bronchitis all her life.. Asthma has been controlled in the past year, using a nebulizer in spring and fall weather. Asvair helped but cause irritation in mouth and she has been changed to try Symbicort. This stil irritates mouth. She expects daily wheeze, no sputum. Needs rescue inhaler about once/ week. Allergy skin testing x 3 in past and on allergy vaccine three separate times while in [place].  Occasional reflux- now controlled.  Lives in house [address] years old- crawl space, gas heat, carpet. Denies mold, pets, smokers. She has had hoarseness with abnormal cytology and is to have surgical consult in November. Part of concern is that she wants stability of her asthma should she need surgery. IgE 08/13/06: elevated 774.9.  June 11, 2010- Allergic rhinitis, Asthma, bronchitis Had thyroidectomy w/o respiratory complication in December.  Uses Symbicort w/ spacer with no more thrush. Good control until this week when began more chest tight, cough, wheeze, frontal HA with a little nasal stuffiness. No obvious infection, sore throat, fever or purulent. She feels well controlled with current meds. She  blames too much time indoors. Reviewed environmental precautions. Discussed unnecessary duplicate meds on list- albuterol and Duoneb.  Denies hx heart disease or VTE. Today she feels she is choking, tight in chest and wants to do better. Last prednisone in June. Using SABA 3 x/ week.  CXR-03/14/10- minimal atelectasis, scarring LLL, mild CE, otw NAD Allergy profile-03/14/10- broad elevation of IgE. Total IgE 542.1 PFT- 03/18/10- WNL except DLCO 0.67- probably from her weight crowding her. Small airways response to bronchodilator.      Asthma History    Asthma Control Assessment:    Age range: 12+ years    Symptoms: >2 days/week    Nighttime Awakenings: 0-2/month    Interferes w/ normal activity: some limitations    SABA use (not for EIB): >2 days/week    Asthma Control Assessment: Not Well Controlled   Preventive Screening-Counseling & Management  Alcohol-Tobacco     Smoking Status: quit     Packs/Day: 0.5     Year Quit: 1995  Current Medications (verified): 1)  Hydrochlorothiazide 25 Mg Tabs (Hydrochlorothiazide) .... One By Mouth Once Daily 2)  Levothyroxine Sodium 200 Mcg Tabs (Levothyroxine Sodium) .... 2 Tabs By Mouth Qd 3)  Omeprazole-Sodium Bicarbonate 40-1100 Mg Caps (Omeprazole-Sodium Bicarbonate) .... One By Mouth Qd 4)  Claritin-D 12 Hour 5-120 Mg  Tb12 (Loratadine-Pseudoephedrine) 5)  Vitamin B-12 500 Mcg  Tabs (Cyanocobalamin) .... Take 1 Tablet By Mouth Once A Day 6)  Ventolin Hfa 108 (90 Base) Mcg/act Aers (Albuterol Sulfate) .... 2 Puffs Every 6 Hours As Needed 7)  Duoneb 2.5-0.5 Mg/3ml  Soln (Albuterol-Ipratropium) .... Twice Weekly As Needed 8)  Nu-Iron 150 Mg  Caps (Polysaccharide Iron Complex) .[name].. 1 By Mouth Once Daily 9)  Vitamin D 87564 Unit  Caps (Ergocalciferol) .... By Mouth Once Weekly 10)  Diovan 160 Mg  Tabs (Valsartan) .... One By Mouth Once Daily 11)  Vitamin C Cr 1000 Mg Cr-Tabs (Ascorbic Acid) .... Take 1 Tablet By Mouth Once A Day 12)  Compressor/nebulizer  Misc (Nebulizers) .... Dispense One Nebulizer Re:  493.90 13)  Furosemide 40 Mg Tabs (Furosemide) .... Take 1 Tablet By Mouth Once A Day 14)  Albuterol Sulfate (2.5 Mg/100ml)  0.083% Nebu (Albuterol Sulfate) .... Use Qid As Needed 15)  Accolate 20 Mg Tabs (Zafirlukast) .... One By Mouth Bid 16)  Aerochamber Spacer 17)  Symbicort 80-4.5 Mcg/act Aero (Budesonide-Formoterol Fumarate) .... 2 Puffs By Mouth Two Times A Day 18)  Calcitriol 0.5 Mcg Caps (Calcitriol) .... Take 2 By Mouth Two Times A Day 19)  Tums 500 Mg Chew (Calcium Carbonate Antacid)  .... Take 1 By Mouth Four Times A Day  Allergies (verified): 1)  ! Augmentin 2)  ! 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  She was light smoker for 10 years.        Risk Factors: Caffeine Use: 2 (07/19/2008) Exercise: yes (07/19/2008)  Risk Factors: Smoking Status: quit (06/11/2010) Packs/Day: 0.5 (06/11/2010)  Past Medical History: Allergic rhinitis Asthma Vocal cord cytology abnormal- for biopsy Hypertension     Hypothyroidism     Morbid obesity - S/P Gastric Bypass    Anemia - iron deficiency  Vit D deficiency   Thryoid cancer- resectiuon, XRT 2011- Dr Sharl Ma    Past Surgical History: T&A  Bunions Bilat. feet  Appendectomy    Gastric bypass 1976     Throidectomy-  thyroid cancer, no mets. XRT      Review of Systems      See HPI       The patient complains of shortness of breath with activity, headaches, and nasal congestion/difficulty breathing through nose.  The patient denies shortness of breath at rest, productive cough, non-productive cough, coughing up blood, chest pain, irregular heartbeats, acid heartburn, indigestion, loss of appetite, weight change, abdominal pain, difficulty swallowing, sore throat, tooth/dental problems, sneezing, itching, ear ache, rash, and change in color of mucus.    Vital Signs:  Patient profile:   55 year old female Height:      64 inches Weight:      332.25 pounds BMI:     57.24 O2 Sat:      97 % on Room air Pulse rate:   78 / minute BP sitting:   112 / 64  (left arm) Cuff size:   large  Vitals Entered By: Reynaldo Minium CMA (June 11, 2010 1:37  PM)  O2 Flow:  Room air   Physical Exam  Additional Exam:  General: A/Ox3; pleasant and cooperative, NAD,  very obese SKIN: no rash, lesions NODES: no lymphadenopathy HEENT: [place]/AT, EOM- WNL, Conjuctivae- clear, PERRLA, TM-WNL, Nose- clear, Throat- clear and wnl, hoarse,  NECK: Supple w/ fair ROM, JVD- none, normal carotid impulses w/o bruits Thyroid- normal to palpation CHEST: Clear to P&A, no cough or wheeze, unlabored HEART: RRR, no m/g/r heard ABDOMEN: Soft and nl; nml bowel sounds; no organomegaly or masses noted. Overweight ZOX:WRUE, nl pulses, trace edema,  NEURO: Grossly intact to observation      Impression & Recommendations:  Problem # 1:  ASTHMA (ICD-493.90) Chronic asthma with strong atopic component. She is concerned that her allergy attacks are getting more intense over time. We discussed Xoalir.  Need to ddx dyspnea of obesity/ hypoventilation and deconditioning Need to watch out for significance of increasing heart size as  thyroid hormone levels are manipulated. I will order echo for wall movement and  EF.   Problem # 2:  ALLERGIC RHINITIS (ICD-477.9)  Good control She attributes some frontal headache yesterday to "indoor allergy" but unclear.   Problem # 3:  THYROID NODULE, LEFT (ICD-241.0) Discussed thyroid related symptoms and impact on breathing.   Medications Added to Medication List This Visit: 1)  Calcitriol 0.5 Mcg Caps (Calcitriol) .... Take 2 by mouth two times a day 2)  Tums 500 Mg Chew (Calcium carbonate antacid) .... Take 1 by mouth four times a day  Other Orders: Est. Patient Level IV (11914) Echo Referral (Echo)  Patient Instructions: 1)  Please schedule a follow-up appointment in 1 month. 2)  Continue present meds 3)  Script for nebulizer med refill 4)  A 2D Echocardiogram has been recommended.  Your imaging study may require preauthorization. 5)  We will start an application for Xolair therapy. Info sheet  given. Prescriptions: ALBUTEROL SULFATE (2.5 MG/3ML) 0.083% NEBU (ALBUTEROL SULFATE) use qid as needed  #25 x prn   Entered and Authorized by:   Waymon Budge MD   Signed by:   Waymon Budge MD on 06/11/2010   Method used:   Electronically to        [facility] [address] (retail)       [phone]       [place], [place]  [address]       Ph: [phone]       Fax: [phone]   RxID:   [id number]

## 2010-06-27 NOTE — Assessment & Plan Note (Signed)
Summary: 6 month fu/dt   Vital Signs:  Patient profile:   55 year old female Height:      64 inches Weight:      321 pounds BMI:     55.30 O2 Sat:      82 % on Room air Temp:     98.4 degrees F oral Pulse rate:   97 / minute Resp:     20 per minute BP sitting:   134 / 72  (right arm) Cuff size:   large  Vitals Entered By: Glendell Docker CMA (May 09, 2010 10:33 AM)  O2 Flow:  Room air CC: 6 Month Follow up  Is Patient Diabetic? No Pain Assessment Patient in pain? no      Comments referral to Endocrinology  Dr. Debara Pickett with [facility] [phone], scheduled to have thyroid removed on 12/21 by Dr Gerrit Friends, preop  is scheduled for tomorrow, discuss medication changes and water retention   Primary Care Provider:  D. Thomos Lemons DO  CC:  6 Month Follow up .  History of Present Illness: 55 y/o white female for f/u int hx: thyroid u/s showed nodule biopsy concerning for papillary thyroid cancer pt has appt with Dr. Gerrit Friends for thyroidectomy  htn - stable  asthma - stable  Preventive Screening-Counseling & Management  Alcohol-Tobacco     Smoking Status: quit  Allergies: 1)  ! Augmentin 2)  ! Pcn  Past History:  Past Medical History: Allergic rhinitis Asthma Vocal cord cytology abnormal- for biopsy Hypertension     Hypothyroidism     Morbid obesity - S/P Gastric Bypass    Anemia - iron deficiency  Vit D deficiency      Past Surgical History: T&A  Bunions Bilat. feet  Appendectomy    Gastric bypass 1976         Family History: Father with history of hypertension, diabetes, lung cancer, and asbestosis- died of MI Mother with history of hypertension, hyperthyroidism, heart disease- died of MI Brother with asthma            Physical Exam  General:  alert, well-developed, and well-nourished.   Lungs:  normal respiratory effort and normal breath sounds.   Heart:  normal rate, regular rhythm, and no gallop.     Impression & Recommendations:  Problem #  1:  THYROID NODULE, LEFT (ICD-241.0) left thyroid nodule biopsied atypical cells founds suggestive of papillary thyroid cancer pt scheduled for thyroid surgery refer to Dr. Sharl Ma . she may need subsequent radioactive iodine ablation  Orders: Endocrinology Referral (Endocrine)  Problem # 2:  HYPERTENSION (ICD-401.9) Assessment: Improved  Her updated medication list for this problem includes:    Hydrochlorothiazide 25 Mg Tabs (Hydrochlorothiazide) ..... One by mouth once daily    Diovan 160 Mg Tabs (Valsartan) ..... One by mouth once daily    Furosemide 40 Mg Tabs (Furosemide) .[name]... Take 1 tablet by mouth once a day  BP today: 134/72 Prior BP: 142/76 (03/14/2010)  Labs Reviewed: K+: 4.4 (04/30/2010) Creat: : 0.81 (04/30/2010)   Chol: 130 (04/30/2010)   HDL: 39 (04/30/2010)   LDL: 62 (04/30/2010)   TG: 146 (04/30/2010)  Problem # 3:  LEUKOCYTOSIS (ICD-288.60) Assessment: Improved followed by Dr. Myna Hidalgo  Complete Medication List: 1)  Hydrochlorothiazide 25 Mg Tabs (Hydrochlorothiazide) .... One by mouth once daily 2)  Levothyroxine Sodium 200 Mcg Tabs (Levothyroxine sodium) .... 2 tabs by mouth qd 3)  Omeprazole-sodium Bicarbonate 40-1100 Mg Caps (Omeprazole-sodium bicarbonate) .... One by mouth qd 4)  Claritin-d  12 Hour 5-120 Mg Tb12 (Loratadine-pseudoephedrine) 5)  Vitamin B-12 500 Mcg Tabs (Cyanocobalamin) .... Take 1 tablet by mouth once a day 6)  Ventolin Hfa 108 (90 Base) Mcg/act Aers (Albuterol sulfate) .... 2 puffs every 6 hours as needed 7)  Duoneb 2.5-0.5 Mg/35ml Soln (Albuterol-ipratropium) .... Twice weekly as needed 8)  Nu-iron 150 Mg Caps (Polysaccharide iron complex) .[name].. 1 by mouth once daily 9)  Vitamin D 11914 Unit Caps (Ergocalciferol) .... By mouth once weekly 10)  Diovan 160 Mg Tabs (Valsartan) .... One by mouth once daily 11)  Vitamin C Cr 1000 Mg Cr-tabs (Ascorbic acid) .... Take 1 tablet by mouth once a day 12)  Compressor/nebulizer Misc (Nebulizers) ....  Dispense one nebulizer re:  493.90 13)  Furosemide 40 Mg Tabs (Furosemide) .... Take 1 tablet by mouth once a day 14)  Albuterol Sulfate (2.5 Mg/71ml) 0.083% Nebu (Albuterol sulfate) .... Use qid as needed 15)  Accolate 20 Mg Tabs (Zafirlukast) .... One by mouth bid 16)  Aerochamber Spacer  17)  Symbicort 80-4.5 Mcg/act Aero (Budesonide-formoterol fumarate) .... 2 puffs by mouth two times a day  Other Orders: UA Dipstick w/o Micro (manual) (78295) EKG w/ Interpretation (93000)  Patient Instructions: 1)  Please schedule a follow-up appointment in 3 months.   Orders Added: 1)  UA Dipstick w/o Micro (manual) [81002] 2)  EKG w/ Interpretation [93000] 3)  Endocrinology Referral [Endocrine] 4)  Est. Patient Level III [62130]   Immunization History:  Influenza Immunization History:    Influenza:  historical (02/05/2010)   Immunization History:  Influenza Immunization History:    Influenza:  Historical (02/05/2010)  Current Allergies (reviewed today): ! AUGMENTIN ! PCN       Laboratory Results   Urine Tests    Routine Urinalysis   Color: straw Appearance: Clear Glucose: negative   (Normal Range: Negative) Bilirubin: negative   (Normal Range: Negative) Ketone: negative   (Normal Range: Negative) Spec. Gravity: 1.010   (Normal Range: 1.003-1.035) Blood: negative   (Normal Range: Negative) pH: 6.0   (Normal Range: 5.0-8.0) Protein: negative   (Normal Range: Negative) Urobilinogen: 0.2   (Normal Range: 0-1) Nitrite: negative   (Normal Range: Negative) Leukocyte Esterace: negative   (Normal Range: Negative)

## 2010-07-05 ENCOUNTER — Encounter: Payer: Self-pay | Admitting: Internal Medicine

## 2010-07-05 ENCOUNTER — Other Ambulatory Visit (HOSPITAL_COMMUNITY): Payer: Self-pay | Admitting: Internal Medicine

## 2010-07-05 DIAGNOSIS — C73 Malignant neoplasm of thyroid gland: Secondary | ICD-10-CM

## 2010-07-08 ENCOUNTER — Ambulatory Visit (HOSPITAL_COMMUNITY)
Admission: RE | Admit: 2010-07-08 | Discharge: 2010-07-08 | Disposition: A | Discharge status: Home / Self Care | Payer: No Typology Code available for payment source | Source: Ambulatory Visit | Attending: Internal Medicine | Admitting: Internal Medicine

## 2010-07-08 DIAGNOSIS — C73 Malignant neoplasm of thyroid gland: Secondary | ICD-10-CM | POA: Insufficient documentation

## 2010-07-11 NOTE — Consult Note (Signed)
Summary: [name] at [facility] at [facility]   Imported By: Maryln Gottron 07/04/2010 09:48:54  _____________________________________________________________________  External Attachment:    Type:   Image     Comment:   External Document

## 2010-07-11 NOTE — Letter (Signed)
Summary: [facility]  [facility]   Imported By: Maryln Gottron 07/04/2010 09:58:29  _____________________________________________________________________  External Attachment:    Type:   Image     Comment:   External Document

## 2010-07-11 NOTE — Letter (Signed)
Summary: [facility]   Imported By: Lanelle Bal 07/02/2010 13:08:51  _____________________________________________________________________  External Attachment:    Type:   Image     Comment:   External Document

## 2010-07-12 ENCOUNTER — Ambulatory Visit (INDEPENDENT_AMBULATORY_CARE_PROVIDER_SITE_OTHER): Payer: No Typology Code available for payment source | Admitting: Internal Medicine

## 2010-07-12 ENCOUNTER — Encounter: Payer: Self-pay | Admitting: Internal Medicine

## 2010-07-12 DIAGNOSIS — J45909 Unspecified asthma, uncomplicated: Secondary | ICD-10-CM

## 2010-07-12 DIAGNOSIS — J309 Allergic rhinitis, unspecified: Secondary | ICD-10-CM

## 2010-07-15 ENCOUNTER — Telehealth: Payer: Self-pay | Admitting: Internal Medicine

## 2010-07-17 NOTE — Letter (Signed)
Summary: [name] at [facility] at [facility]   Imported By: Maryln Gottron 07/12/2010 12:10:04  _____________________________________________________________________  External Attachment:    Type:   Image     Comment:   External Document

## 2010-07-23 NOTE — Letter (Signed)
Summary: [facility]   Imported By: Maryln Gottron 07/18/2010 14:09:47  _____________________________________________________________________  External Attachment:    Type:   Image     Comment:   External Document

## 2010-07-23 NOTE — Progress Notes (Signed)
Summary: pt is no longer interested in [employer] Note Outgoing Call   Call placed by: Alfonso Ramus, [facility] Call placed to: Patient Summary of Call: Called placed to patient to have her to come up to office to review paperwork for xolair, obtain signatures, etc and pt stated that "she is no longer interested in pursing the xolair". I documented this in the order and advised pt that I would let Dr. Maple Hudson this. Initial call taken by: Alfonso Ramus,  July 15, 2010 2:13 PM  Follow-up for Phone Call        Noted Follow-up by: Waymon Budge MD,  July 16, 2010 1:04 PM

## 2010-07-23 NOTE — Assessment & Plan Note (Signed)
Summary: 1 month return//[facility]   Primary Provider/Referring Provider:  Dondra Spry DO  CC:  1 month follow up visit-asthma and allergies. Raspy voice at this time-?allergies. .  History of Present Illness: June 11, 2010- Allergic rhinitis, Asthma, bronchitis Had thyroidectomy w/o respiratory complication in December.  Uses Symbicort w/ spacer with no more thrush. Good control until this week when began more chest tight, cough, wheeze, frontal HA with a little nasal stuffiness. No obvious infection, sore throat, fever or purulent. She feels well controlled with current meds. She  blames too much time indoors. Reviewed environmental precautions. Discussed unnecessary duplicate meds on list- albuterol and Duoneb.  Denies hx heart disease or VTE. Today she feels she is choking, tight in chest and wants to do better. Last prednisone in June. Using SABA 3 x/ week.  CXR-03/14/10- minimal atelectasis, scarring LLL, mild CE, otw NAD Allergy profile-03/14/10- broad elevation of IgE. Total IgE 542.1 PFT- 03/18/10- WNL except DLCO 0.67- probably from her weight crowding her. Small airways response to bronchodilator.   July 12, 2010-  Allergic rhinitis, Asthma, bronchitis Nurse-CC: 1 month follow up visit-asthma and allergies. Raspy voice at this time-?allergies.  ECHO-06/14/10- limited study. Right heart prob ok, EF prob ok, stiff/ diastolic dysfunction.  Attributes variable raspy voice to allergy, denies reflux.Denies recent acute illness.  Dr Artist Pais changed Symbicort strength down to 80/4.5- not through yet with her 160/4.5. Discussed use of aerochamber with it. Needed prednisone in Jne, 2011.  Nose is dry. Skin tested several times in the past. She did read Xolair fact sheet.    Asthma History    Asthma Control Assessment:    Age range: 12+ years    Symptoms: >2 days/week    Nighttime Awakenings: 0-2/month    Interferes w/ normal activity: some limitations    SABA use (not for EIB): >2  days/week    Asthma Control Assessment: Not Well Controlled   Preventive Screening-Counseling & Management  Alcohol-Tobacco     Smoking Status: quit     Packs/Day: 0.5     Year Quit: 1995  Current Medications (verified): 1)  Hydrochlorothiazide 25 Mg Tabs (Hydrochlorothiazide) .... One By Mouth Once Daily 2)  Levothyroxine Sodium 200 Mcg Tabs (Levothyroxine Sodium) .... Take 1 and 1/4 Tablets Daily 3)  Omeprazole-Sodium Bicarbonate 40-1100 Mg Caps (Omeprazole-Sodium Bicarbonate) .... One By Mouth Qd 4)  Claritin-D 12 Hour 5-120 Mg  Tb12 (Loratadine-Pseudoephedrine) 5)  Vitamin B-12 500 Mcg  Tabs (Cyanocobalamin) .... Take 1 Tablet By Mouth Once A Day 6)  Ventolin Hfa 108 (90 Base) Mcg/act Aers (Albuterol Sulfate) .... 2 Puffs Every 6 Hours As Needed 7)  Nu-Iron 150 Mg  Caps (Polysaccharide Iron Complex) .[name].. 1 By Mouth Once Daily 8)  Vitamin D 66440 Unit  Caps (Ergocalciferol) .... By Mouth Once Weekly 9)  Diovan 160 Mg  Tabs (Valsartan) .... One By Mouth Once Daily 10)  Vitamin C Cr 1000 Mg Cr-Tabs (Ascorbic Acid) .... Take 1 Tablet By Mouth Once A Day 11)  Compressor/nebulizer  Misc (Nebulizers) .... Dispense One Nebulizer Re:  493.90 12)  Furosemide 40 Mg Tabs (Furosemide) .... Take 1 Tablet By Mouth Once A Day 13)  Albuterol Sulfate (2.5 Mg/19ml) 0.083% Nebu (Albuterol Sulfate) .... Use Qid As Needed 14)  Accolate 20 Mg Tabs (Zafirlukast) .... One By Mouth Bid 15)  Aerochamber Spacer 16)  Symbicort 80-4.5 Mcg/act Aero (Budesonide-Formoterol Fumarate) .... 2 Puffs By Mouth Two Times A Day 17)  Calcitriol 0.5 Mcg Caps (Calcitriol) .... Take 2  By Mouth Two Times A Day 18)  Tums 500 Mg Chew (Calcium Carbonate Antacid) .... Take 1 By Mouth Four Times A Day  Allergies (verified): 1)  ! Augmentin 2)  ! Pcn  Past History:  Past Medical History: Last updated: 06/11/2010 Allergic rhinitis Asthma Vocal cord cytology abnormal- for biopsy Hypertension     Hypothyroidism     Morbid  obesity - S/P Gastric Bypass    Anemia - iron deficiency  Vit D deficiency   Thryoid cancer- resectiuon, XRT 2011- Dr Sharl Ma    Past Surgical History: Last updated: 06/11/2010 T&A  Bunions Bilat. feet  Appendectomy    Gastric bypass 1976     Throidectomy-  thyroid cancer, no mets. 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  She was light smoker for 10 years.        Risk Factors: Caffeine Use: 2 (07/19/2008) Exercise: yes (07/19/2008)  Risk Factors: Smoking Status: quit (07/12/2010) Packs/Day: 0.5 (07/12/2010)  Review of Systems      See HPI       The patient complains of shortness of breath with activity and non-productive cough.  The patient denies shortness of breath at rest, productive cough, coughing up blood, chest pain, irregular heartbeats, acid heartburn, indigestion, loss of appetite, weight change, abdominal pain, difficulty swallowing, sore throat, tooth/dental problems, headaches, nasal congestion/difficulty breathing through nose, and sneezing.    Vital Signs:  Patient profile:   55 year old female Height:      64 inches Weight:      319.50 pounds BMI:     55.04 O2 Sat:      96 % on Room air Pulse rate:   79 / minute BP sitting:   128 / 80  (left arm) Cuff size:   large  Vitals Entered By: Reynaldo Minium CMA (July 12, 2010 2:15 PM)  O2 Flow:  Room air CC: 1 month follow up visit-asthma and allergies. Raspy voice at this time-?allergies.    Physical Exam  Additional Exam:  General: A/Ox3; pleasant and cooperative, NAD,  very obese SKIN: no rash, lesions NODES: no lymphadenopathy HEENT:  [place]/AT, EOM- WNL, Conjuctivae- clear, PERRLA, TM-WNL, Nose- clear, Throat- clear and wnl, hoarse, minimally coated NECK: Supple w/ fair ROM, JVD- none, normal carotid impulses w/o bruits Thyroid- normal to palpation CHEST: Clear to P&A, no cough or wheeze, unlabored HEART: RRR, no m/g/r heard ABDOMEN: . Overweight JSE:GBTD, nl pulses, trace edema,  NEURO: Grossly intact to observation      Impression & Recommendations:  Problem # 1:  ASTHMA (ICD-493.90) Currently doing better than she expects to do in mind summer, when she has previously needed prednisone. We discussed Xolair. She has read the information and we had a detailed 20 minute discussion of risk-benefit/ goals. she would like [place] to go forward with aplication.   Problem # 2:  ALLERGIC RHINITIS (ICD-477.9) Will use otc antihistamines initially, watching response to Spring pollen season coming.   Medications Added to Medication List This Visit: 1)  Levothyroxine Sodium 200 Mcg Tabs (Levothyroxine sodium) .... Take 1 and 1/4 tablets daily  Other  Orders: Est. Patient Level IV (16109) Misc. Referral (Misc. Ref)  Patient Instructions: 1)  Please schedule a follow-up appointment in 3 months. 2)  See [facility] to start Xolair process 3)  Continue present meds

## 2010-08-01 ENCOUNTER — Encounter (HOSPITAL_BASED_OUTPATIENT_CLINIC_OR_DEPARTMENT_OTHER): Payer: No Typology Code available for payment source | Admitting: Hematology & Oncology

## 2010-08-01 ENCOUNTER — Other Ambulatory Visit: Payer: Self-pay | Admitting: Hematology & Oncology

## 2010-08-01 DIAGNOSIS — D509 Iron deficiency anemia, unspecified: Secondary | ICD-10-CM

## 2010-08-01 DIAGNOSIS — E041 Nontoxic single thyroid nodule: Secondary | ICD-10-CM

## 2010-08-01 DIAGNOSIS — Z9884 Bariatric surgery status: Secondary | ICD-10-CM

## 2010-08-01 DIAGNOSIS — D72829 Elevated white blood cell count, unspecified: Secondary | ICD-10-CM

## 2010-08-01 DIAGNOSIS — C73 Malignant neoplasm of thyroid gland: Secondary | ICD-10-CM

## 2010-08-01 LAB — CBC WITH DIFFERENTIAL (CANCER CENTER ONLY)
BASO#: 0 10*3/uL (ref 0.0–0.2)
Eosinophils Absolute: 0.3 10*3/uL (ref 0.0–0.5)
HCT: 38.1 % (ref 34.8–46.6)
HGB: 12.2 g/dL (ref 11.6–15.9)
LYMPH%: 16.6 % (ref 14.0–48.0)
MCH: 25.4 pg — ABNORMAL LOW (ref 26.0–34.0)
MCV: 79 fL — ABNORMAL LOW (ref 81–101)
MONO%: 5.5 % (ref 0.0–13.0)
NEUT%: 75.1 % (ref 39.6–80.0)
RBC: 4.8 10*6/uL (ref 3.70–5.32)

## 2010-08-01 LAB — IRON AND TIBC
Iron: 34 ug/dL — ABNORMAL LOW (ref 42–145)
UIBC: 288 ug/dL

## 2010-08-01 LAB — VITAMIN B12: Vitamin B-12: 317 pg/mL (ref 211–911)

## 2010-08-01 LAB — FERRITIN: Ferritin: 142 ng/mL (ref 10–291)

## 2010-08-05 LAB — URINALYSIS, ROUTINE W REFLEX MICROSCOPIC
Bilirubin Urine: NEGATIVE
Glucose, UA: NEGATIVE mg/dL
Hgb urine dipstick: NEGATIVE
Ketones, ur: NEGATIVE mg/dL
Protein, ur: NEGATIVE mg/dL

## 2010-08-05 LAB — CBC
HCT: 38.7 % (ref 36.0–46.0)
MCH: 25.5 pg — ABNORMAL LOW (ref 26.0–34.0)
MCHC: 31 g/dL (ref 30.0–36.0)
MCV: 82.2 fL (ref 78.0–100.0)
Platelets: 242 10*3/uL (ref 150–400)
RDW: 16.7 % — ABNORMAL HIGH (ref 11.5–15.5)

## 2010-08-05 LAB — CALCIUM
Calcium: 7.1 mg/dL — ABNORMAL LOW (ref 8.4–10.5)
Calcium: 7.4 mg/dL — ABNORMAL LOW (ref 8.4–10.5)
Calcium: 7.6 mg/dL — ABNORMAL LOW (ref 8.4–10.5)
Calcium: 7.9 mg/dL — ABNORMAL LOW (ref 8.4–10.5)
Calcium: 8 mg/dL — ABNORMAL LOW (ref 8.4–10.5)
Calcium: 8.1 mg/dL — ABNORMAL LOW (ref 8.4–10.5)

## 2010-08-05 LAB — BASIC METABOLIC PANEL
BUN: 11 mg/dL (ref 6–23)
Chloride: 101 mEq/L (ref 96–112)
Creatinine, Ser: 0.87 mg/dL (ref 0.4–1.2)
Glucose, Bld: 101 mg/dL — ABNORMAL HIGH (ref 70–99)
Potassium: 4.2 mEq/L (ref 3.5–5.1)

## 2010-08-05 LAB — DIFFERENTIAL
Basophils Absolute: 0.1 10*3/uL (ref 0.0–0.1)
Basophils Relative: 0 % (ref 0–1)
Eosinophils Absolute: 0.2 10*3/uL (ref 0.0–0.7)
Eosinophils Relative: 1 % (ref 0–5)
Lymphocytes Relative: 15 % (ref 12–46)
Monocytes Absolute: 1.1 10*3/uL — ABNORMAL HIGH (ref 0.1–1.0)

## 2010-08-05 LAB — PROTIME-INR: Prothrombin Time: 14.1 seconds (ref 11.6–15.2)

## 2010-08-05 LAB — URINE MICROSCOPIC-ADD ON

## 2010-08-05 LAB — SURGICAL PCR SCREEN
MRSA, PCR: NEGATIVE
Staphylococcus aureus: NEGATIVE

## 2010-08-05 LAB — PHOSPHORUS: Phosphorus: 8 mg/dL — ABNORMAL HIGH (ref 2.3–4.6)

## 2010-08-06 ENCOUNTER — Ambulatory Visit (INDEPENDENT_AMBULATORY_CARE_PROVIDER_SITE_OTHER): Payer: No Typology Code available for payment source | Admitting: Internal Medicine

## 2010-08-06 ENCOUNTER — Encounter: Payer: Self-pay | Admitting: Internal Medicine

## 2010-08-06 DIAGNOSIS — R197 Diarrhea, unspecified: Secondary | ICD-10-CM | POA: Insufficient documentation

## 2010-08-06 DIAGNOSIS — J45909 Unspecified asthma, uncomplicated: Secondary | ICD-10-CM

## 2010-08-06 DIAGNOSIS — E041 Nontoxic single thyroid nodule: Secondary | ICD-10-CM

## 2010-08-06 DIAGNOSIS — I10 Essential (primary) hypertension: Secondary | ICD-10-CM

## 2010-08-06 LAB — CONVERTED CEMR LAB
CO2: 28 meq/L (ref 19–32)
Chloride: 99 meq/L (ref 96–112)
Creatinine, Ser: 0.86 mg/dL (ref 0.40–1.20)

## 2010-08-08 ENCOUNTER — Telehealth: Payer: Self-pay | Admitting: Internal Medicine

## 2010-08-09 ENCOUNTER — Telehealth: Payer: Self-pay | Admitting: Internal Medicine

## 2010-08-13 NOTE — Progress Notes (Signed)
Summary: refill-symbicort  Phone Note Refill Request Message from:  Fax from Pharmacy on August 09, 2010 12:35 PM  Refills Requested: Medication #1:  symbicort 160-4.5 aer   Brand Name Necessary? No   Supply Requested: 1 month   Last Refilled: 04/09/2010 [facility] [address] [place] [address] fax [phone]   Method Requested: Electronic Next Appointment Scheduled: 7.13.12 Junita Kubota Initial call taken by: Elba Barman,  August 09, 2010 12:37 PM  Follow-up for Phone Call        Rx completed in Dr. Tiajuana Amass Follow-up by: Glendell Docker CMA,  August 09, 2010 1:09 PM    Prescriptions: SYMBICORT 80-4.5 MCG/ACT AERO (BUDESONIDE-FORMOTEROL FUMARATE) 2 puffs by mouth two times a day  #1 x 5   Entered by:   Glendell Docker CMA   Authorized by:   D. Thomos Lemons DO   Signed by:   Glendell Docker CMA on 08/09/2010   Method used:   Electronically to        [facility] (retail)       [phone]       [place], [place]  [address]       Ph: [phone]       Fax: [phone]   RxID:   [id number]

## 2010-08-13 NOTE — Progress Notes (Signed)
Summary: Lab Results  Phone Note Outgoing Call   Summary of Call: call pt - stool test negative for C Diff infection.  she can stop taking metroniadazole however, she can continue to use samples of welchol for loose stools I suggest OV within 1 wk if persistent diarrhea Initial call taken by: D. Thomos Lemons DO,  August 08, 2010 1:02 PM  Follow-up for Phone Call        call placed to patient at [phone], she was informed per Dr Artist Pais instructions and has verbalized understanding. Follow-up by: Glendell Docker CMA,  August 08, 2010 2:09 PM

## 2010-08-15 ENCOUNTER — Encounter (HOSPITAL_BASED_OUTPATIENT_CLINIC_OR_DEPARTMENT_OTHER): Payer: No Typology Code available for payment source | Admitting: Hematology & Oncology

## 2010-08-15 DIAGNOSIS — D509 Iron deficiency anemia, unspecified: Secondary | ICD-10-CM

## 2010-08-15 DIAGNOSIS — E538 Deficiency of other specified B group vitamins: Secondary | ICD-10-CM

## 2010-08-15 DIAGNOSIS — C73 Malignant neoplasm of thyroid gland: Secondary | ICD-10-CM

## 2010-08-16 ENCOUNTER — Telehealth: Payer: Self-pay | Admitting: Internal Medicine

## 2010-08-16 NOTE — Telephone Encounter (Signed)
[facility] [address] [place] [address] fax [phone]  Ventolin hfa aer. Qty 18. Inhale two puffs every 6 hours as needed. Last fill 9.17.11

## 2010-08-19 MED ORDER — ALBUTEROL SULFATE HFA 108 (90 BASE) MCG/ACT IN AERS: 2.0000 | INHALATION_SPRAY | Freq: Four times a day (QID) | RESPIRATORY_TRACT | Status: DC | PRN

## 2010-08-19 NOTE — Telephone Encounter (Signed)
Refilled to the [facility] [address]

## 2010-08-27 NOTE — Assessment & Plan Note (Signed)
Summary: 3 month follow up/mhf   Vital Signs:  Patient profile:   55 year old female Height:      64 inches Weight:      319 pounds BMI:     54.95 O2 Sat:      96 % on Room air Temp:     98.1 degrees F oral Pulse rate:   80 / minute Resp:     18 per minute BP sitting:   112 / 72  (right arm) Cuff size:   large  Vitals Entered By: Glendell Docker CMA (August 06, 2010 9:03 AM)  O2 Flow:  Room air CC: 3 Month follow up  Is Patient Diabetic? No Pain Assessment Patient in pain? no      Comments no concerns , non fasting   Primary Care Provider:  Dondra Spry DO  CC:  3 Month follow up .  History of Present Illness: 55 y/o female for follow up s/p thyroid surgery and radioactive iodine ablation waiting until easter to see her nephews and niece  asthma - stable.    htn - stable  Preventive Screening-Counseling & Management  Alcohol-Tobacco     Smoking Status: quit  Allergies: 1)  ! Augmentin 2)  ! Pcn  Past History:  Past Medical History: Allergic rhinitis Asthma Vocal cord cytology abnormal- for biopsy Hypertension     Hypothyroidism      Morbid obesity - S/P Gastric Bypass     Anemia - iron deficiency  Vit D deficiency   Thryoid cancer- resection, XRT 2011- Dr Sharl Ma    Past Surgical History: T&A  Bunions Bilat. feet  Appendectomy    Gastric bypass 1976      Throidectomy-  thyroid cancer, no mets. 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  She was light smoker for 10 years.         Review of Systems       pt c/o diarrhea  Physical Exam  General:  alert and overweight-appearing.   Lungs:  normal respiratory effort and normal  breath sounds.   Heart:  normal rate, regular rhythm, and no gallop.   Neurologic:  cranial nerves II-XII intact.   Psych:  normally interactive, good eye contact, not anxious appearing, and not depressed appearing.     Impression & Recommendations:  Problem # 1:  ASTHMA (ICD-493.90) Assessment Unchanged  Her updated medication list for this problem includes:    Ventolin Hfa 108 (90 Base) Mcg/act Aers (Albuterol sulfate) .[name]... 2 puffs every 6 hours as needed    Albuterol Sulfate (2.5 Mg/14ml) 0.083% Nebu (Albuterol sulfate) ..... Use qid as needed    Accolate 20 Mg Tabs (Zafirlukast) ..... One by mouth bid    Symbicort 80-4.5 Mcg/act Aero (Budesonide-formoterol fumarate) .[name]... 2 puffs by mouth two times a day  Problem # 2:  THYROID NODULE, LEFT (ICD-241.0) Assessment: Improved S/P Throidectomy and radioactive iodine ablation followed by Dr. Jamison Oka  Problem # 3:  HYPERTENSION (ICD-401.9) Assessment: Unchanged  Her updated medication list for this problem includes:    Hydrochlorothiazide 25 Mg Tabs (Hydrochlorothiazide) ..... One by mouth once daily  Diovan 160 Mg Tabs (Valsartan) ..... One by mouth once daily    Furosemide 40 Mg Tabs (Furosemide) .[name]... Take 1 tablet by mouth once a day  Future Orders: T-Basic Metabolic Panel [id number]) ... 12/02/2010  BP today: 112/72 Prior BP: 128/80 (07/12/2010)  Labs Reviewed: K+: 4.4 (04/30/2010) Creat: : 0.81 (04/30/2010)   Chol: 130 (04/30/2010)   HDL: 39 (04/30/2010)   LDL: 62 (04/30/2010)   TG: 146 (04/30/2010)  Problem # 4:  DIARRHEA (ICD-787.91) rule out C Diff empiric flagyl  Orders: T- * Misc. Laboratory test [phone]) T-Basic Metabolic Panel [phone])  Complete Medication List: 1)  Hydrochlorothiazide 25 Mg Tabs (Hydrochlorothiazide) .... One by mouth once daily 2)  Levothyroxine Sodium 200 Mcg Tabs (Levothyroxine sodium) .... Take 1 and 1/4 tablets daily 3)  Omeprazole-sodium Bicarbonate 40-1100 Mg Caps  (Omeprazole-sodium bicarbonate) .... One by mouth qd 4)  Claritin-d 12 Hour 5-120 Mg Tb12 (Loratadine-pseudoephedrine) 5)  Vitamin B-12 500 Mcg Tabs (Cyanocobalamin) .... Take 1 tablet by mouth once a day 6)  Ventolin Hfa 108 (90 Base) Mcg/act Aers (Albuterol sulfate) .... 2 puffs every 6 hours as needed 7)  Nu-iron 150 Mg Caps (Polysaccharide iron complex) .[name].. 1 by mouth once daily 8)  Vitamin D 13086 Unit Caps (Ergocalciferol) .... By mouth once weekly 9)  Diovan 160 Mg Tabs (Valsartan) .... One by mouth once daily 10)  Vitamin C Cr 1000 Mg Cr-tabs (Ascorbic acid) .... Take 1 tablet by mouth once a day 11)  Compressor/nebulizer Misc (Nebulizers) .... Dispense one nebulizer re:  493.90 12)  Furosemide 40 Mg Tabs (Furosemide) .... Take 1 tablet by mouth once a day 13)  Albuterol Sulfate (2.5 Mg/9ml) 0.083% Nebu (Albuterol sulfate) .... Use qid as needed 14)  Accolate 20 Mg Tabs (Zafirlukast) .... One by mouth bid 15)  Aerochamber Spacer  16)  Symbicort 80-4.5 Mcg/act Aero (Budesonide-formoterol fumarate) .... 2 puffs by mouth two times a day 17)  Calcitriol 0.5 Mcg Caps (Calcitriol) .... Take 3  by mouth two times a day 18)  Tums 500 Mg Chew (Calcium carbonate antacid) .... Take 1 by mouth four times a day 19)  Metronidazole 500 Mg Tabs (Metronidazole) .... One by mouth three times a day  Patient Instructions: 1)  Please schedule a follow-up appointment in 4 months. 2)  BMP prior to visit, ICD-9:  401.9 3)  Please return for lab work one (1) week before your next appointment.  Prescriptions: METRONIDAZOLE 500 MG TABS (METRONIDAZOLE) one by mouth three times a day  #42 x 0   Entered and Authorized by:   D. Thomos Lemons DO   Signed by:   D. Thomos Lemons DO on 08/06/2010   Method used:   Print then Give to Patient   RxID:   [id number]    Orders Added: 1)  T- * Misc. Laboratory test [99999] 2)  T-Basic Metabolic Panel [[id number]] 3)  T-Basic Metabolic Panel [[id number]] 4)  Est.  Patient Level III [44010]    Current Allergies (reviewed today): ! AUGMENTIN ! PCN

## 2010-10-07 ENCOUNTER — Other Ambulatory Visit: Payer: Self-pay | Admitting: Hematology & Oncology

## 2010-10-07 ENCOUNTER — Encounter (HOSPITAL_BASED_OUTPATIENT_CLINIC_OR_DEPARTMENT_OTHER): Payer: No Typology Code available for payment source | Admitting: Hematology & Oncology

## 2010-10-07 DIAGNOSIS — C73 Malignant neoplasm of thyroid gland: Secondary | ICD-10-CM

## 2010-10-07 DIAGNOSIS — D509 Iron deficiency anemia, unspecified: Secondary | ICD-10-CM

## 2010-10-07 DIAGNOSIS — D72829 Elevated white blood cell count, unspecified: Secondary | ICD-10-CM

## 2010-10-07 DIAGNOSIS — D72823 Leukemoid reaction: Secondary | ICD-10-CM

## 2010-10-07 DIAGNOSIS — E041 Nontoxic single thyroid nodule: Secondary | ICD-10-CM

## 2010-10-07 DIAGNOSIS — Z9884 Bariatric surgery status: Secondary | ICD-10-CM

## 2010-10-07 LAB — CBC WITH DIFFERENTIAL (CANCER CENTER ONLY)
BASO%: 0.6 % (ref 0.0–2.0)
LYMPH%: 19.6 % (ref 14.0–48.0)
MCH: 26.4 pg (ref 26.0–34.0)
MCV: 83 fL (ref 81–101)
MONO%: 6.9 % (ref 0.0–13.0)
NEUT%: 69.9 % (ref 39.6–80.0)
Platelets: 210 10*3/uL (ref 145–400)
RDW: 16.5 % — ABNORMAL HIGH (ref 11.1–15.7)

## 2010-10-07 LAB — IRON AND TIBC: %SAT: 16 % — ABNORMAL LOW (ref 20–55)

## 2010-10-07 LAB — CHCC SATELLITE - SMEAR

## 2010-10-11 NOTE — Assessment & Plan Note (Signed)
[facility]                             PRIMARY CARE OFFICE NOTE   JAASIA, VIGLIONE                        MRN:          [id number]  DATE:03/13/2006                            DOB:          [birth year]    CHIEF COMPLAINT:  New patient to practice.   HISTORY OF PRESENT ILLNESS:  Patient is a 55 year old white female here for  established primary care.  Patient has recently moved here from [place]  in December of 2006.   PAST MEDICAL HISTORY:  Significant for lifelong asthma.  She also notes that  she gets an episode of bronchitis once or twice per year.  She has been  taking Advair for the last 2 years but still has exacerbations once or twice  per week, requiring use of rescue albuterol.  Patient states that her  breathing always seems to be worse at night or early in the morning.   Recently, she was seen by Urgent Care physician, Dr. Modena Slater.  She is noted  to be hypothyroid, and TSH was checked.  It was elevated at 14.366, and she  denies missing her medications, also denies taking her medications with  other supplements, such as antacids or iron tablets.   He has also noted elevated blood pressure.  She has been on Maxzide only,  and in his office, blood pressures are in the 160s systolic.  Patient does  not have any symptoms associated with elevated blood pressure.   She has been struggling with her weight for most of her life, is at a  current weight of 313 pounds.  Patient has tried multiple diet programs.  In  fact, patient states that she underwent some type of gastric bypass surgery  in 1976.  Initially, she lost approximately 70 to 80 pounds but regained  most of that weight.   PAST MEDICAL HISTORY:  Summary:  1. Chronic asthma.  2. Hypertension.  3. Hypothyroidism.  4. Status post appendectomy 1976.  5. Tonsils and adenoids in 1962.  6. Status post gastric bypass 1976.   CURRENT MEDICATIONS:  1. Maxzide 25 mg once a day.  2. Singulair 10 mg q.h.s.  3. Levoxyl 200 mcg q.h.s.  4. Advair 250/50 one dose b.i.d.  5. Albuterol p.r.n.  6. Nasacort AQ p.r.n.   ALLERGIES:  AUGMENTIN WHICH CAUSES DIARRHEA AND RASH.   SOCIAL HISTORY:  Patient is single, has never been married, does not have  any children.  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   FAMILY HISTORY:  Mother is decreased, age 57, had a history of coronary  artery disease.  Father decreased at age 56, had history of hypertension,  asbestosis, diabetes and lung cancer.  Denies any family history of colon  cancer.   PREVENTIVE CARE HISTORY:  Her last Pap was in December of 2006.  Her last  colonoscopy was also in 2006.   She rarely drinks.  She quit tobacco 11 years ago.  She was a light smoker,  half pack per day x10 years.   REVIEW OF  SYSTEMS:  She did undergo allergy testing for her asthma, was  found to be allergic to dust and mold, as well as other items.  She did get  allergy shots, but due to prohibitive cough, this continued.  Patient denies  any chest pain, respiratory symptoms as noted above.  Patient occasionally  gets heartburn.  No dysuria, frequency, urgency, polyuria, polydipsia and  all other systems negative.  Her labs were reviewed from Dr. Modena Slater, TSH  noted above.  Comprehensive metabolic profile was notable for glucose 87,  BUN 11, creatinine 27.5.  LFTs were unremarkable.  Lipid panel was pending.   PHYSICAL EXAMINATION:  VITAL SIGNS:  Height was 5 feet, 8 inches.  Weight  was 313 pounds.  Temperature is 98.6, pulse is 106 and BP is 170/90 in the  left arm in seated position with a large manual cuff.  GENERAL:  The patient is a very pleasant, morbidly obese 55 year old white  female in no apparent distress.  HEENT:  Normocephalic, atraumatic.  Pupils are equal and reactive to light  bilaterally.  Extraocular muscles are intact.  Patient was anicteric.  __________  was  within normal limits.  Patient's oropharynx was  unremarkable.  External auditory canals and tympanic membranes are clear  bilaterally.  NECK:  Supple.  No adenopathy, carotid bruits or thyromegaly.  CHEST:  Normal respiratory effort, just clear to auscultation bilaterally.  No wheezing noted.  CARDIOVASCULAR:  Regular rate and rhythm, no significant murmurs, rubs or  gallops appreciated.  ABDOMEN:  Quite protuberant/obese.  Midline scar well healed.  No  organomegaly appreciated.  MUSCULOSKELETAL:  No clubbing, cyanosis or edema.  SKIN:  Warm and dry.  NEUROLOGICAL:  Cranial nerves 2-12 was grossly intact.  She was non-focal.   IMPRESSION/RECOMMENDATIONS:  1. Asthma, mild to moderate, intermittent.  2. Hypothyroidism under-treated.  3. Morbid obesity.  4. Hypertension, uncontrolled.  5. Probable obstructive sleep apnea.  6. Health maintenance.   RECOMMENDATIONS:  Due to her nighttime symptoms, as well as early morning  symptoms, I strongly suspect that GERD may be contributing to her poor  asthma control.  Patient agrees to trial of omeprazole 20 mg once a day.  I  refilled her other medications today.  She states that her blood pressure  has never been this high in the past.  She will keep a BP log over the next  one month's time.  She will most likely need additional agents for adequate  blood pressure control.   We discussed at length issues with morbid obesity.  Patient is certainly at  much higher risk for sudden cardiac death, as well as other complications  such as osteoarthritis and type 2 diabetes.  Patient somewhat frustrated, is  willing to consider surgical options.  We also discussed briefly the use of  March 15, 2006 off label for her weight loss control.   Her poor blood pressure may also be as a consequence of probable obstructive  sleep apnea.  We discussed potentially sending her for a sleep study in the  future.  In approximately 2 to 3 weeks, she will  repeat her thyroid studies on higher  dose of Synthroid.  She was previously on 175 mcg and reviewed blood  pressure log when she returns to the office in 4 weeks.       Barbette Hair. Artist Pais, DO      RDY/MedQ  DD:  03/15/2006  DT:  03/16/2006  Job #:  [id number]

## 2010-10-11 NOTE — Assessment & Plan Note (Signed)
[facility]                             PULMONARY OFFICE NOTE   [name], [name]                        MRN:          [id number]  DATE:09/10/2006                            DOB:          [birth year]    I saw Miss Lienhard today in followup for her moderate persistent asthma  with allergic rhinitis and postnasal drip.   She says that her sinus symptoms and breathing are doing quite well at  the present time. She was seen by my nurse practitioner, Rubye Oaks  on March 25th and was given a course of Omnicef. She says this helped  with her cough and sputum production as well but that she developed  diarrhea after this. She says that her diarrhea has since resolved.   CURRENT MEDICATIONS:  1. Maxzide 25 mg a day.  2. Singulair 10 mg at bedtime.  3. Advair 500/50, one puff b.i.d.  4. Levoxyl 250 mcg daily.  5. Protonix 40 mg daily.  6. Claritin.  7. Nasonex nasal spray daily.  8. Ventolin HFA which she says she is only using 2 to 3 times per week      now.  9. She also has an albuterol nebulizer which she says she has not had      the need to use since the increase in the dose of her Advair.   On physical exam, she is 228 pounds, temperature is 98.1, blood pressure  is 122/80, heart rate is 78, oxygen saturation is 95% on room air.  HEENT: There is no sign of tenderness, no nasal discharge, no oral  lesions, no lymphadenopathy.  HEART: S1, S2.  CHEST: Clear to auscultation.  ABDOMEN: Obese, soft, nontender.  EXTREMITIES:  There was no edema.   IMPRESSION:  1. Moderate persistent asthma with allergic rhinitis and post nasal      drip. She appears to have improved significant on her current      regimen. I will continue her on Advair 500/50 one puff b.i.d. as      well as Singulair and Claritin. I would also continue her on      Nasacort AQ and nasal irrigation as needed. I again had discussed      with her that she did have significant  increased in her IGE level      and that if she were to have worsening of her symptoms, then      consideration for initiating her on Xolair may need to be taken.  2. Hypertension.  3. Hypothyroidism.   I plan on following up with her in 3 months but advise her to call me  sooner if her symptoms were to worsen.     Coralyn Helling, MD  Electronically Signed    VS/MedQ  DD: 09/10/2006  DT: 09/10/2006  Job #: [id number]   cc:   Barbette Hair. Artist Pais, DO

## 2010-10-17 ENCOUNTER — Encounter: Payer: Self-pay | Admitting: Internal Medicine

## 2010-10-28 ENCOUNTER — Encounter: Payer: Self-pay | Admitting: Internal Medicine

## 2010-10-28 ENCOUNTER — Ambulatory Visit (INDEPENDENT_AMBULATORY_CARE_PROVIDER_SITE_OTHER): Payer: No Typology Code available for payment source | Admitting: Internal Medicine

## 2010-10-28 VITALS — BP 118/68 | HR 98 | Ht 64.0 in | Wt 329.4 lb

## 2010-10-28 DIAGNOSIS — R070 Pain in throat: Secondary | ICD-10-CM

## 2010-10-28 DIAGNOSIS — J45901 Unspecified asthma with (acute) exacerbation: Secondary | ICD-10-CM

## 2010-10-28 MED ORDER — METHYLPREDNISOLONE ACETATE 80 MG/ML IJ SUSP
80.0000 mg | Freq: Once | INTRAMUSCULAR | Status: AC
  Administered 2010-10-28: 80 mg via INTRAMUSCULAR

## 2010-10-28 NOTE — Patient Instructions (Signed)
Depo 80  Sample symbicort 80  Please call as needed

## 2010-10-28 NOTE — Assessment & Plan Note (Addendum)
Exacerbation this week- she blames something outdoors. We discussed options and will give depo to stabilize She will be a good candidate for xolair when she feels able

## 2010-10-28 NOTE — Progress Notes (Signed)
  Subjective:    Patient ID: [name], female    DOB: 1956/03/17, 55 y.o.   MRN: [id number]  HPI 10/28/10- Asthma, allergic rhinitis Last here July 12, 2010. Since then has had resection of thyroid cancer then RAI. Wile she was indoors from that her raspy voice cleared. As she has gotten out more, her raspiness has returned, reinforcing her opinion it is allergic. Wheezing in past week. Not needing nebulizer. She is interested in [place] when her cancer bills are paid off.  Denies choking with food/ drink or obvious reflux.  Review of Systems Constitutional:   No weight loss, night sweats,  Fevers, chills, fatigue, lassitude. HEENT:   No headaches,  Difficulty swallowing,  Tooth/dental problems,               No sneezing, itching, ear ache, nasal congestion, post nasal drip,   CV:  No chest pain,  Orthopnea, PND, swelling in lower extremities, anasarca, dizziness, palpitations  GI  No heartburn, indigestion, abdominal pain, nausea, vomiting, diarrhea, change in bowel habits, loss of appetite  Resp: No shortness of breath with exertion or at rest.  No excess mucus, no productive cough,  No non-productive cough,  No coughing up of blood.  No change in color of mucus.   Skin: no rash or lesions.  GU: no dysuria, change in color of urine, no urgency or frequency.  No flank pain.  MS:  No joint pain or swelling.  No decreased range of motion.  No back pain.  Psych:  No change in mood or affect. No depression or anxiety.  No memory loss.      Objective:   Physical Exam General- Alert, Oriented, Affect-appropriate, Distress- none acute  Obese, hoarse  Skin- rash-none, lesions- none, excoriation- none  Lymphadenopathy- none  Head- atraumatic  Eyes- Gross vision intact, PERRLA, conjunctivae clear secretions  Ears- Hearing, canals, Tm - normal Nose- Clear, No-Septal dev, mucus, polyps, erosion, perforation   Throat- Mallampati II , mucosa clear , drainage- none, tonsils-  atrophic  Neck- flexible , trachea midline, no stridor , thyroid nl, carotid no bruit  Chest - symmetrical excursion , unlabored     Heart/CV- RRR , no murmur , no gallop  , no rub, nl s1 s2                     - JVD- none , edema- none, stasis changes- none, varices- none     Lung- clear to P&A, wheeze- none, cough- none , dullness-none, rub- none     Chest wall- Abd- tender-no, distended-no, bowel sounds-present, HSM- no  Br/ Gen/ Rectal- Not done, not indicated  Extrem- cyanosis- none, clubbing, none, atrophy- none, strength- nl  Neuro- grossly intact to observation         Assessment & Plan:

## 2010-10-29 NOTE — Assessment & Plan Note (Signed)
Need to watch for laryngeal dysfunction including aspiration until her throat settles down after surgery and XRT. Discussed interaction between reflux and asthma.

## 2010-11-13 ENCOUNTER — Other Ambulatory Visit: Payer: Self-pay | Admitting: Internal Medicine

## 2010-11-14 NOTE — Telephone Encounter (Signed)
Rx refill sent to pharmacy. Patient due for office visit 

## 2010-11-29 ENCOUNTER — Encounter: Payer: Self-pay | Admitting: Internal Medicine

## 2010-12-03 ENCOUNTER — Other Ambulatory Visit: Payer: Self-pay | Admitting: Internal Medicine

## 2010-12-03 DIAGNOSIS — C73 Malignant neoplasm of thyroid gland: Secondary | ICD-10-CM

## 2010-12-05 ENCOUNTER — Ambulatory Visit
Admission: RE | Admit: 2010-12-05 | Discharge: 2010-12-05 | Disposition: A | Discharge status: Home / Self Care | Payer: No Typology Code available for payment source | Source: Ambulatory Visit | Attending: Internal Medicine | Admitting: Internal Medicine

## 2010-12-05 DIAGNOSIS — C73 Malignant neoplasm of thyroid gland: Secondary | ICD-10-CM

## 2010-12-06 ENCOUNTER — Ambulatory Visit (INDEPENDENT_AMBULATORY_CARE_PROVIDER_SITE_OTHER): Payer: No Typology Code available for payment source | Admitting: Family

## 2010-12-06 ENCOUNTER — Ambulatory Visit: Payer: No Typology Code available for payment source | Admitting: Internal Medicine

## 2010-12-06 ENCOUNTER — Telehealth: Payer: Self-pay | Admitting: Family

## 2010-12-06 ENCOUNTER — Ambulatory Visit (HOSPITAL_BASED_OUTPATIENT_CLINIC_OR_DEPARTMENT_OTHER)
Admission: RE | Admit: 2010-12-06 | Discharge: 2010-12-06 | Disposition: A | Discharge status: Home / Self Care | Payer: No Typology Code available for payment source | Source: Ambulatory Visit | Attending: Family | Admitting: Family

## 2010-12-06 DIAGNOSIS — R188 Other ascites: Secondary | ICD-10-CM

## 2010-12-06 DIAGNOSIS — R0789 Other chest pain: Secondary | ICD-10-CM

## 2010-12-06 DIAGNOSIS — M47814 Spondylosis without myelopathy or radiculopathy, thoracic region: Secondary | ICD-10-CM

## 2010-12-06 DIAGNOSIS — I10 Essential (primary) hypertension: Secondary | ICD-10-CM

## 2010-12-06 DIAGNOSIS — D72829 Elevated white blood cell count, unspecified: Secondary | ICD-10-CM

## 2010-12-06 DIAGNOSIS — E559 Vitamin D deficiency, unspecified: Secondary | ICD-10-CM

## 2010-12-06 DIAGNOSIS — R079 Chest pain, unspecified: Secondary | ICD-10-CM

## 2010-12-06 DIAGNOSIS — I517 Cardiomegaly: Secondary | ICD-10-CM | POA: Insufficient documentation

## 2010-12-06 DIAGNOSIS — Z8679 Personal history of other diseases of the circulatory system: Secondary | ICD-10-CM

## 2010-12-06 DIAGNOSIS — R791 Abnormal coagulation profile: Secondary | ICD-10-CM

## 2010-12-06 HISTORY — DX: Spondylosis without myelopathy or radiculopathy, thoracic region: M47.814

## 2010-12-06 HISTORY — DX: Personal history of other diseases of the circulatory system: Z86.79

## 2010-12-06 LAB — BASIC METABOLIC PANEL
Calcium: 9.2 mg/dL (ref 8.4–10.5)
Chloride: 100 mEq/L (ref 96–112)
Creat: 0.95 mg/dL (ref 0.50–1.10)

## 2010-12-06 LAB — D-DIMER, QUANTITATIVE: D-Dimer, Quant: 0.56 ug/mL-FEU — ABNORMAL HIGH (ref 0.00–0.48)

## 2010-12-06 LAB — VITAMIN D 25 HYDROXY (VIT D DEFICIENCY, FRACTURES): Vit D, 25-Hydroxy: 36 ng/mL (ref 30–89)

## 2010-12-06 MED ORDER — IOHEXOL 350 MG/ML SOLN
100.0000 mL | Freq: Once | INTRAVENOUS | Status: AC | PRN
  Administered 2010-12-06: 100 mL via INTRAVENOUS

## 2010-12-06 NOTE — Progress Notes (Signed)
Subjective:    Patient ID: [name], female    DOB: [birth year], 55 y.o.   MRN: [id number]  HPI  Chest pain- symptoms are intermittent x 1 year.  Pain can happen at rest or with exertion.  She reports that this is waking her up at night.  She has associated chest pain- sometimes in the right breast and sometimes .  Notes asymmetric lower extremity edema L>R for several years.  Denies associated calf pain.  She reports that she has been taking a calcium supplement due to hypocalcemia since her thyroidectomy.  Pain is worsened by laying on her side, pain is improved by getting up moving. Denies associated with.  Pain is described as mild when it occurs.  She notes some shortness of breath which she attributes to asthma.  Mom (CAD- died at age 20) and dad (died at 13- CAD).Pain is non-radiating.  Pt had an echo in January which appeared normal but was a limited study due to pt's habitus.  HTN-  Patient has been treated for Chronic HTN for quiet sometime. She is currently on lasix, Diovan and HCTZ, and well controlled. No associated S/S related to HTN.   Quality: chronic Modifying factor: meds Duration: Quite sometime Associated S/S: None.  The patient denies the following associated symptoms: Denies blurred vision, headache.  Vitamin D deficiency- on vitamin D 50,000 units weekly. Last checked in 2010. She purchases this over the counter.   Anemia- she has been followed by Dr. Myna Hidalgo. She now follows every 6 months.   Review of Systems See HPI  Past Medical History  Diagnosis Date  . Allergic rhinitis   . Asthma   . Hypertension   . Hypothyroidism   . Anemia, iron deficiency   . Vitamin D deficiency   . Thyroid cancer   . Morbid obesity     History   Social History  . Marital Status: Single    Spouse Name: N/A    Number of Children: N/A  . Years of Education: N/A   Occupational History  . works in Medical laboratory scientific officer    Social History Main Topics  . Smoking status:  Former Smoker -- 10 years  . Smokeless tobacco: Not on file  . Alcohol Use: Not on file  . Drug Use: Not on file  . Sexually Active: Not on file   Other Topics Concern  . 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  She was light smoker for 10 years.           Past Surgical History  Procedure Date  . Tonsillectomy and adenoidectomy   . Bunionectomy     bilateral  . Appendectomy   . Gastric bypass   . Thyroidectomy     Family History  Problem Relation Age of Onset  . Hypertension Father   . Diabetes Father   . Lung cancer Father   . Heart attack Father   . Hypertension Mother   . Hyperthyroidism Mother   . Heart disease Mother   . Heart attack Mother   . Asthma Brother     Allergies  Allergen Reactions  . GMW:NUUVOZDGUYQ+IHKVQQVZD+GLOVFIEPPI Acid+Aspartame   . Penicillins     Current Outpatient Prescriptions on File Prior to Visit  Medication Sig Dispense Refill  . albuterol (PROVENTIL) (2.5 MG/3ML) 0.083% nebulizer solution 1 vial 4 times a day as needed       .  albuterol (VENTOLIN HFA) 108 (90 BASE) MCG/ACT inhaler Inhale 2 puffs into the lungs every 6 (six) hours as needed for wheezing or shortness of breath.  1 Inhaler  3  . Ascorbic Acid (VITAMIN C CR) 1000 MG TBCR Take 1 twice a week      . budesonide-formoterol (SYMBICORT) 80-4.5 MCG/ACT inhaler Inhale 2 puffs into the lungs 2 (two) times daily.        . calcitRIOL (ROCALTROL) 0.5 MCG capsule 3 tablets twice a day       . calcium carbonate 200 MG capsule Take 8,000 mg by mouth daily.       . Cyanocobalamin (VITAMIN B 12 PO) 500 mcg once a week       . DIOVAN 160 MG tablet TAKE ONE TABLET BY MOUTH EVERY DAY  90 each  0  . ergocalciferol (VITAMIN D2) 50000 UNITS capsule Take 50,000 Units by mouth once a week.        . furosemide (LASIX) 40 MG tablet Take 40 mg by mouth daily.        .  hydrochlorothiazide 25 MG tablet Take 25 mg by mouth daily.        . iron polysaccharides (NU-IRON) 150 MG capsule Take 150 mg by mouth every 30 (thirty) days.       [name] levothyroxine (SYNTHROID, LEVOTHROID) 200 MCG tablet Take 1 1/4 tablets a day       . Loratadine-Pseudoephedrine (CLARITIN-D 12 HOUR PO) As needed       . omeprazole-sodium bicarbonate (ZEGERID) 40-1100 MG per capsule Take 1 capsule by mouth daily.          BP 120/64  Pulse 73  Temp(Src) 97.8 F (36.6 C) (Oral)  Resp 16  Ht 5\' 4"  (1.626 m)  Wt 329 lb 1.3 oz (149.27 kg)  BMI 56.49 kg/m2  SpO2 97%  LMP 05/26/2009       Objective:   Physical Exam  Constitutional: She is oriented to person, place, and time. She appears well-developed and well-nourished.  Cardiovascular: Normal rate and regular rhythm.   Pulmonary/Chest: Effort normal and breath sounds normal.  Musculoskeletal: Normal range of motion.       1+ bilateral LE edema  Neurological: She is alert and oriented to person, place, and time.  Psychiatric: She has a normal mood and affect. Her behavior is normal. Judgment and thought content normal.          Assessment & Plan:

## 2010-12-06 NOTE — Telephone Encounter (Addendum)
Reviewed D Dimer results.  + D Dimer.  Recommended that patient complete CTA tonight.  She is agreeable. Ordered as a stat report to be called to MD on call- Dr. Nena Jordan.  Spoke to Dr. Darrell Jewel- she is expecting call.

## 2010-12-06 NOTE — Patient Instructions (Signed)
Please complete your lab work on the first floor. You will be contacted about your referral for the [facility] and your referral to cardiology. Follow up in 1 month.

## 2010-12-07 ENCOUNTER — Telehealth: Payer: Self-pay | Admitting: Family

## 2010-12-07 NOTE — Telephone Encounter (Signed)
Reviewed CTA results- neg for PE.  Called pt and notified her of negative results.

## 2010-12-08 NOTE — Telephone Encounter (Signed)
Discussed this with patient on evening July 13th also .

## 2010-12-09 ENCOUNTER — Other Ambulatory Visit: Payer: Self-pay | Admitting: Internal Medicine

## 2010-12-09 ENCOUNTER — Encounter: Payer: Self-pay | Admitting: Family

## 2010-12-11 ENCOUNTER — Other Ambulatory Visit: Payer: No Typology Code available for payment source

## 2010-12-13 DIAGNOSIS — R0789 Other chest pain: Secondary | ICD-10-CM | POA: Insufficient documentation

## 2010-12-13 NOTE — Assessment & Plan Note (Signed)
Stable vitamin D level on weekly dosing, continue same.

## 2010-12-13 NOTE — Assessment & Plan Note (Signed)
CTA chest was performed to exclude PE and was negative.  Recommended that

## 2010-12-13 NOTE — Assessment & Plan Note (Signed)
BP Readings from Last 3 Encounters:  12/06/10 120/64  10/28/10 118/68  08/06/10 112/72  BP stable, continue current meds.

## 2010-12-16 ENCOUNTER — Other Ambulatory Visit: Payer: Self-pay | Admitting: Internal Medicine

## 2010-12-16 NOTE — Telephone Encounter (Signed)
Rx refill sent to pharmacy. 

## 2010-12-17 ENCOUNTER — Encounter: Payer: Self-pay | Admitting: Cardiology

## 2010-12-17 ENCOUNTER — Ambulatory Visit (HOSPITAL_COMMUNITY): Payer: No Typology Code available for payment source | Attending: Cardiology | Admitting: Radiology

## 2010-12-17 DIAGNOSIS — R079 Chest pain, unspecified: Secondary | ICD-10-CM

## 2010-12-17 DIAGNOSIS — R0602 Shortness of breath: Secondary | ICD-10-CM

## 2010-12-17 DIAGNOSIS — R0789 Other chest pain: Secondary | ICD-10-CM | POA: Insufficient documentation

## 2010-12-17 DIAGNOSIS — I491 Atrial premature depolarization: Secondary | ICD-10-CM

## 2010-12-17 MED ORDER — TECHNETIUM TC 99M TETROFOSMIN IV KIT
33.0000 | PACK | Freq: Once | INTRAVENOUS | Status: AC | PRN
  Administered 2010-12-17: 33 via INTRAVENOUS

## 2010-12-17 MED ORDER — REGADENOSON 0.4 MG/5ML IV SOLN
0.4000 mg | Freq: Once | INTRAVENOUS | Status: AC
  Administered 2010-12-17: 0.4 mg via INTRAVENOUS

## 2010-12-17 NOTE — Progress Notes (Signed)
[facility] SITE 3 NUCLEAR MED [address] [place] [address] [phone]  Cardiology Nuclear Med Study  VELMER BROADFOOT is a 55 y.o. female [id number] [birth year]   Nuclear Med Background Indication for Stress Test:  Evaluation for Ischemia History:  No previous documented CAD and 06/14/10 Echo: NL LVF Tech Limited Study Cardiac Risk Factors: Family History - CAD, History of Smoking, Hypertension and Obesity  Symptoms:  Chest Pain, DOE and Palpitations   Nuclear Pre-Procedure Caffeine/Decaff Intake:  None NPO After: 8:00pm   Lungs:  clear IV 0.9% NS with Angio Cath:  20g  IV Site: R Antecubital  IV Started by:  Irean Hong, RN  Chest Size (in):  54 Cup Size: B  Height: 5\' 4"  (1.626 m)  Weight:  328 lb (148.78 kg)  BMI:  Body mass index is 56.30 kg/(m^2). Tech Comments:  n/a    Nuclear Med Study 1 or 2 day study: 2 day  Stress Test Type:  Eugenie Birks  Reading MD: Kristeen Miss, MD  Order Authorizing Provider:  B.Crenshaw  Resting Radionuclide: Technetium 61m Tetrofosmin  Resting Radionuclide Dose: 33.0 mCi   Stress Radionuclide:  Technetium 19m Tetrofosmin  Stress Radionuclide Dose: 33.0 mCi           Stress Protocol Rest HR: 84 Stress HR: 100  Rest BP: 124/63 Stress BP: 146/64  Exercise Time (min): n/a METS: n/a   Predicted Max HR: 167 bpm % Max HR: 59.88 bpm Rate Pressure Product: 21308   Dose of Adenosine (mg):  n/a Dose of Lexiscan: 0.4 mg  Dose of Atropine (mg): n/a Dose of Dobutamine: n/a   Stress Test Technologist: Milana Na, EMT-P  Nuclear Technologist:  Doyne Keel, CNMT     Rest Procedure:  Myocardial perfusion imaging was performed at rest 45 minutes following the intravenous administration of Technetium 23m Tetrofosmin. Rest ECG: NSR  Stress Procedure:  The patient received IV Lexiscan 0.4 mg over 15-seconds with concurrent low level exercise and then Technetium 85m Tetrofosmin was injected at 30-seconds while the  patient continued walking one more minute.  There were no significant changes with Lexiscan.  Quantitative spect images were obtained after a 45-minute delay. Stress ECG: No significant ST segment change suggestive of ischemia.  QPS Raw Data Images:  Acquisition technically good; normal left ventricular size. Stress Images:  There is decreased uptake in the inferior wall. Rest Images:  Normal homogeneous uptake in all areas of the myocardium. Subtraction (SDS):  These findings are consistent with mild ischemia in the distal inferior wall. Transient Ischemic Dilatation (Normal <1.22):  0.98 Lung/Heart Ratio (Normal <0.45):  0.36  Quantitative Gated Spect Images QGS EDV:  123 ml QGS ESV:  44 ml QGS cine images:  NL LV Function; NL Wall Motion QGS EF: 64%  Impression Exercise Capacity:  Lexiscan with no exercise. BP Response:  Normal blood pressure response. Clinical Symptoms:  No chest pain. ECG Impression:  No significant ST segment change suggestive of ischemia. Comparison with Prior Nuclear Study: No images to compare  Overall Impression:  Abnormal stress nuclear study with mild ischemia in the distal inferior wall.       [name]

## 2010-12-18 ENCOUNTER — Encounter: Payer: Self-pay | Admitting: Cardiology

## 2010-12-18 ENCOUNTER — Ambulatory Visit (INDEPENDENT_AMBULATORY_CARE_PROVIDER_SITE_OTHER): Payer: No Typology Code available for payment source | Admitting: Cardiology

## 2010-12-18 ENCOUNTER — Ambulatory Visit (HOSPITAL_COMMUNITY): Payer: No Typology Code available for payment source | Attending: Cardiology | Admitting: Radiology

## 2010-12-18 ENCOUNTER — Ambulatory Visit: Payer: No Typology Code available for payment source | Admitting: Cardiology

## 2010-12-18 VITALS — BP 130/70 | HR 82 | Resp 18 | Ht 66.0 in | Wt 332.1 lb

## 2010-12-18 DIAGNOSIS — R55 Syncope and collapse: Secondary | ICD-10-CM | POA: Insufficient documentation

## 2010-12-18 DIAGNOSIS — R0989 Other specified symptoms and signs involving the circulatory and respiratory systems: Secondary | ICD-10-CM

## 2010-12-18 DIAGNOSIS — I10 Essential (primary) hypertension: Secondary | ICD-10-CM

## 2010-12-18 DIAGNOSIS — R0789 Other chest pain: Secondary | ICD-10-CM

## 2010-12-18 MED ORDER — TECHNETIUM TC 99M TETROFOSMIN IV KIT
33.0000 | PACK | Freq: Once | INTRAVENOUS | Status: AC | PRN
  Administered 2010-12-18: 33 via INTRAVENOUS

## 2010-12-18 NOTE — Patient Instructions (Addendum)
Your physician has recommended that you wear an event monitor. Event monitors are medical devices that record the heart's electrical activity. Doctors most often [place] these monitors to diagnose arrhythmias. Arrhythmias are problems with the speed or rhythm of the heartbeat. The monitor is a small, portable device. You can wear one while you do your normal daily activities. This is usually used to diagnose what is causing palpitations/syncope (passing out).  Your physician recommends that you schedule a follow-up appointment in: 8 WEEKS

## 2010-12-18 NOTE — Progress Notes (Signed)
HPI: 55 year old female for evaluation of chest pain. Echocardiogram in January of 2012 showed normal LV function, mild left ventricular hypertrophy and mild left atrial enlargement. CTA showed no pulmonary embolus in July 2012. Patient had a Myoview completed today. Her ejection fraction is 64%. Preliminary review suggests very mild ischemia in the inferoapical wall versus soft tissue attenuation on the short axis images. Patient has had intermittent chest pain for 2-3 years. It is in the right chest area and is well-defined small area. It is described as a pressure without radiation. It lasts seconds to a minute and resolve spontaneously. No associated symptoms. The pain is not pleuritic, positional, exertional or related to food. She denies dyspnea on exertion or orthopnea but does have pedal edema for which she takes diuretics. Her edema has been present for several years. She also has episodes of near syncope for 2-3 years. They occur every 6 weeks. These are not related to exertion or position. They occur suddenly and last seconds and resolve spontaneously. She does feel hard heartbeats with these. She has not had syncope. There is no associated chest pain, nausea or diaphoresis. Because of the above we were asked to further evaluate.  Current Outpatient Prescriptions  Medication Sig Dispense Refill  . albuterol (PROVENTIL) (2.5 MG/3ML) 0.083% nebulizer solution 1 vial 4 times a day as needed       . albuterol (VENTOLIN HFA) 108 (90 BASE) MCG/ACT inhaler Inhale 2 puffs into the lungs every 6 (six) hours as needed for wheezing or shortness of breath.  1 Inhaler  3  . Ascorbic Acid (VITAMIN C CR) 1000 MG TBCR Take 1 twice a week      . budesonide-formoterol (SYMBICORT) 80-4.5 MCG/ACT inhaler Inhale 2 puffs into the lungs 2 (two) times daily.        . calcitRIOL (ROCALTROL) 0.5 MCG capsule 3 tablets twice a day       . calcium carbonate 200 MG capsule Take 8,000 mg by mouth daily.       . Cyanocobalamin  (VITAMIN B 12 PO) 500 mcg once a week       . DIOVAN 160 MG tablet TAKE ONE TABLET BY MOUTH EVERY DAY  90 each  0  . ergocalciferol (VITAMIN D2) 50000 UNITS capsule Take 50,000 Units by mouth once a week.        . furosemide (LASIX) 40 MG tablet Take 40 mg by mouth daily.        . hydrochlorothiazide 25 MG tablet TAKE ONE TABLET BY MOUTH EVERY DAY  30 tablet  3  . iron polysaccharides (NU-IRON) 150 MG capsule Take 150 mg by mouth every 30 (thirty) days.       [name] levothyroxine (SYNTHROID, LEVOTHROID) 200 MCG tablet per day      . Loratadine-Pseudoephedrine (CLARITIN-D 12 HOUR PO) As needed       . omeprazole-sodium bicarbonate (ZEGERID) 40-1100 MG per capsule Take 1 capsule by mouth daily.        . zafirlukast (ACCOLATE) 20 MG tablet         No current facility-administered medications for this visit.   Facility-Administered Medications Ordered in Other Visits  Medication Dose Route Frequency Provider Last Rate Last Dose  . technetium tetrofosmin (TC-MYOVIEW) injection 33 milli Curie  33 milli Curie Intravenous Once PRN Elyn Aquas., MD   33 milli Curie at 12/18/10 0745    Allergies  Allergen Reactions  . QIO:NGEXBMWUXLK+GMWNUUVOZ+DGUYQIHKVQ Acid+Aspartame   . Penicillins     Past  Medical History  Diagnosis Date  . Allergic rhinitis   . Asthma   . Hypertension   . Hypothyroidism   . Anemia, iron deficiency   . Vitamin D deficiency   . Thyroid cancer   . Morbid obesity   . GERD (gastroesophageal reflux disease)     Past Surgical History  Procedure Date  . Tonsillectomy and adenoidectomy   . Bunionectomy     bilateral  . Appendectomy   . Gastric bypass   . Thyroidectomy     History   Social History  . Marital Status: Single    Spouse Name: N/A    Number of Children: 0  . Years of Education: N/A   Occupational History  . works in Medical laboratory scientific officer   . [employer]    Social History Main Topics  . Smoking status: Former Smoker -- 10 years  .  Smokeless tobacco: Not on file  . Alcohol Use: Yes     Occasional  . Drug Use: Not on file  . Sexually Active: Not on file   Other Topics Concern  . 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  She was light smoker for 10 years.           Family History  Problem Relation Age of Onset  . Hypertension Father   . Diabetes Father   . Lung cancer Father   . Heart attack Father     MI at age 57  . Hypertension Mother   . Hyperthyroidism Mother   . Heart disease Mother   . Heart attack Mother     MI at age 79  . Asthma Brother     ROS: no fevers or chills, productive cough, hemoptysis, dysphasia, odynophagia, melena, hematochezia, dysuria, hematuria, rash, seizure activity, orthopnea, PND, pedal edema, claudication. Remaining systems are negative.  Physical Exam: General:  Well developed/obese in NAD Skin warm/dry Patient not depressed No peripheral clubbing Back-normal HEENT-normal/normal eyelids Neck supple/normal carotid upstroke bilaterally; no bruits; no JVD; no thyromegaly chest - CTA/ normal expansion CV - RRR/normal S1 and S2; no murmurs, rubs or gallops;  PMI nondisplaced Abdomen -difficult due to obesity, NT/ND, no HSM, no mass, + bowel sounds, no bruit  femoral pulses not palpated, no bruits Ext-1+ edema, no chords, 2+ DP Neuro-grossly nonfocal  ECG sinus rhythm at a rate of 82. Nonspecific ST changes.

## 2010-12-18 NOTE — Assessment & Plan Note (Signed)
Patient with near syncopal episodes of uncertain etiology. LV function normal. Plan CardioNet.

## 2010-12-18 NOTE — Assessment & Plan Note (Signed)
Symptoms extremely atypical and unlikely to be cardiac. Her stress test suggest minimal ischemia in the inferior apical wall versus soft tissue attenuation. It is low risk. I do not think she requires cardiac catheterization at this point.

## 2010-12-18 NOTE — Assessment & Plan Note (Signed)
Blood pressure controlled. Continue present medications. 

## 2010-12-20 ENCOUNTER — Telehealth: Payer: Self-pay

## 2010-12-20 NOTE — Telephone Encounter (Signed)
Call patient for appt. For monitor and she request that this monitor be mail out to her Home, [facility] will send the monitor to patient.

## 2010-12-25 DIAGNOSIS — I4891 Unspecified atrial fibrillation: Secondary | ICD-10-CM

## 2010-12-25 HISTORY — DX: Unspecified atrial fibrillation: I48.91

## 2011-01-06 ENCOUNTER — Telehealth: Payer: Self-pay | Admitting: Internal Medicine

## 2011-01-06 NOTE — Telephone Encounter (Signed)
[id number] x1 

## 2011-01-07 ENCOUNTER — Encounter: Payer: Self-pay | Admitting: Family

## 2011-01-07 ENCOUNTER — Ambulatory Visit (INDEPENDENT_AMBULATORY_CARE_PROVIDER_SITE_OTHER): Payer: No Typology Code available for payment source | Admitting: Family

## 2011-01-07 DIAGNOSIS — J45909 Unspecified asthma, uncomplicated: Secondary | ICD-10-CM

## 2011-01-07 DIAGNOSIS — R0789 Other chest pain: Secondary | ICD-10-CM

## 2011-01-07 DIAGNOSIS — E209 Hypoparathyroidism, unspecified: Secondary | ICD-10-CM | POA: Insufficient documentation

## 2011-01-07 HISTORY — DX: Hypoparathyroidism, unspecified: E20.9

## 2011-01-07 MED ORDER — BUDESONIDE-FORMOTEROL FUMARATE 160-4.5 MCG/ACT IN AERO: 2.0000 | INHALATION_SPRAY | Freq: Two times a day (BID) | RESPIRATORY_TRACT | Status: DC

## 2011-01-07 MED ORDER — PREDNISONE 10 MG PO TABS: ORAL_TABLET | ORAL | Status: DC

## 2011-01-07 NOTE — Telephone Encounter (Signed)
[name]/Dr. Maple Hudson, pls clarify - Prednisone 20mg  #10 take as directed below.  # 10 will not be enough to cover taper.  Are you wanting 10mg  tablets # 20 x 0.

## 2011-01-07 NOTE — Telephone Encounter (Signed)
Sorry , this was written wrong-please give Prednisone 10mg  #20 thanks.

## 2011-01-07 NOTE — Telephone Encounter (Signed)
Called, spoke with pt.  She is aware CDY recs she take pred 10 mg tablets 4 x 2 days, 3 x 2 days, 2 x 2 days, 1 x 2 days then stop.  She is aware to keep appt with CDY on 01/28/11 unless needed sooner.  Rx sent to [facility] on [address].  Pt aware and verbalized understanding of these instructions.

## 2011-01-07 NOTE — Progress Notes (Signed)
Subjective:    Patient ID: [name], female    DOB: 29-Oct-1955, 55 y.o.   MRN: [id number]  HPI  [name] is a 55 yr old female who presents today for follow up.    1) Atypical chest pain/palpitations-  Last visit she was referred for a  CTA chest, stress test and cardiology evaluation.   CT angio was negative for PE.  Stress test noted mild ischemia in the inferoapical wall versus soft tissue attenuation. She was evaluated by Dr. Jens Som and felt to be low risk, therefore a cardiac catheterization was not persued.  She was instructed to complete a 30 day cardionet which she has been wearing for the last 2 weeks. She has had some chest discomfort and some "irregular heartbeat." Follow up is scheduled with Dr. Jens Som in early September.   2) Asthma- notes that she feels "like I can't take a full breath."  Symptoms were worse yesterday while at home.  Got better when she went to work.  Thinks that there is something in the environment which is worsening her asthma.  She reports + med compliance with symbicort.   Review of Systems    see HPI  Past Medical History  Diagnosis Date  . Allergic rhinitis   . Asthma   . Hypertension   . Hypothyroidism   . Anemia, iron deficiency   . Vitamin D deficiency   . Thyroid cancer   . Morbid obesity   . GERD (gastroesophageal reflux disease)     History   Social History  . Marital Status: Single    Spouse Name: N/A    Number of Children: 0  . Years of Education: N/A   Occupational History  . works in Medical laboratory scientific officer   . DES[employer]    Social History Main Topics  . Smoking status: Former Smoker -- 10 years  . Smokeless tobacco: Never Used  . Alcohol Use: Yes     Occasional  . Drug Use: No  . Sexually Active: Yes -- Female partner(s)   Other Topics Concern  . 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   She was light smoker for 10 years.           Past Surgical History  Procedure Date  . Tonsillectomy and adenoidectomy   . Bunionectomy     bilateral  . Appendectomy   . Gastric bypass   . Thyroidectomy     Family History  Problem Relation Age of Onset  . Hypertension Father   . Diabetes Father   . Lung cancer Father   . Heart attack Father     MI at age 10  . Hypertension Mother   . Hyperthyroidism Mother   . Heart disease Mother   . Heart attack Mother     MI at age 26  . Asthma Brother     Allergies  Allergen Reactions  . MVH:QIONGEXBMWU+XLKGMWNUU+VOZDGUYQIH Acid+Aspartame   . Penicillins     Current Outpatient Prescriptions on File Prior to Visit  Medication Sig Dispense Refill  . albuterol (PROVENTIL) (2.5 MG/3ML) 0.083% nebulizer solution 1 vial 4 times a day as needed       . albuterol (VENTOLIN HFA) 108 (90 BASE) MCG/ACT inhaler Inhale 2 puffs into the lungs every 6 (six) hours as needed for wheezing or shortness of breath.  1 Inhaler  3  . Ascorbic Acid (VITAMIN C CR) 1000 MG TBCR Take 1 twice a week      . calcitRIOL (ROCALTROL) 0.5 MCG capsule 3 tablets twice a day       . calcium carbonate 200 MG capsule Take 8,000 mg by mouth daily.       . Cyanocobalamin (VITAMIN B 12 PO) 500 mcg once a week       . DIOVAN 160 MG tablet TAKE ONE TABLET BY MOUTH EVERY DAY  90 each  0  . ergocalciferol (VITAMIN D2) 50000 UNITS capsule Take 50,000 Units by mouth once a week.        . furosemide (LASIX) 40 MG tablet Take 40 mg by mouth daily.        . hydrochlorothiazide 25 MG tablet TAKE ONE TABLET BY MOUTH EVERY DAY  30 tablet  3  . iron polysaccharides (NU-IRON) 150 MG capsule Take 150 mg by mouth every 30 (thirty) days.       [name] levothyroxine (SYNTHROID, LEVOTHROID) 200 MCG tablet 525 mcg. per day      . Loratadine-Pseudoephedrine (CLARITIN-D 12 HOUR PO) As needed       . omeprazole-sodium bicarbonate (ZEGERID) 40-1100 MG per capsule Take 1 capsule by mouth daily.         . zafirlukast (ACCOLATE) 20 MG tablet          BP 122/72  Pulse 96  Temp(Src) 98.3 F (36.8 C) (Oral)  Ht 5\' 4"  (1.626 m)  Wt 333 lb 1.9 oz (151.102 kg)  BMI 57.18 kg/m2  SpO2 97%  LMP 05/26/2009    Objective:   Physical Exam  Constitutional: She appears well-developed and well-nourished. No distress.  HENT:  Head: Normocephalic and atraumatic.  Cardiovascular: Normal rate and regular rhythm.   No murmur heard. Pulmonary/Chest: Effort normal and breath sounds normal. No respiratory distress. She has no wheezes. She has no rales.  Psychiatric: She has a normal mood and affect. Her behavior is normal. Judgment and thought content normal.          Assessment & Plan:

## 2011-01-07 NOTE — Patient Instructions (Signed)
Please follow up in 3 months. Sooner if problems or concerns.  

## 2011-01-07 NOTE — Telephone Encounter (Signed)
Spoke with patient-states she is having a "hard time moving air"; no wheezing or cough. Pt is using rescue inhaler, Symbicort inhaler, and nebulizer. Pt requests Pred taper. Please advise.

## 2011-01-07 NOTE — Assessment & Plan Note (Signed)
Deteriorated clinically.  No overt wheezing today.  Will increase her symbicort from 80 to 160.

## 2011-01-07 NOTE — Telephone Encounter (Signed)
Per CY-okay to give Prednisone 20mg  #10 take 4 x 2 days, 3 x 2 days, 2 x 2 days, 1 x 2 days then stop. No Refills. Also, keep appt with CY on 01-28-11 unless needed sooner.

## 2011-01-07 NOTE — Assessment & Plan Note (Signed)
Pt is completing cardionet, management per cardiology.

## 2011-01-15 ENCOUNTER — Other Ambulatory Visit: Payer: Self-pay | Admitting: Internal Medicine

## 2011-01-28 ENCOUNTER — Ambulatory Visit (INDEPENDENT_AMBULATORY_CARE_PROVIDER_SITE_OTHER): Payer: No Typology Code available for payment source | Admitting: Internal Medicine

## 2011-01-28 ENCOUNTER — Encounter: Payer: Self-pay | Admitting: Internal Medicine

## 2011-01-28 VITALS — BP 110/62 | HR 89 | Ht 64.0 in | Wt 335.6 lb

## 2011-01-28 DIAGNOSIS — J45909 Unspecified asthma, uncomplicated: Secondary | ICD-10-CM

## 2011-01-28 DIAGNOSIS — Z23 Encounter for immunization: Secondary | ICD-10-CM

## 2011-01-28 MED ORDER — PREDNISONE 10 MG PO TABS: ORAL_TABLET | ORAL | Status: DC

## 2011-01-28 NOTE — Assessment & Plan Note (Signed)
We discussed weather and Fall seasonal respiratory triggers, but do not think she has an infection now. She senses some tightening and we decided to give her script for prednisone to hold.

## 2011-01-28 NOTE — Progress Notes (Signed)
Subjective:    Patient ID: [name], female    DOB: 12-11-1955, 55 y.o.   MRN: [id number]  HPI    Review of Systems     Objective:   Physical Exam        Assessment & Plan:   Subjective:    Patient ID: [name], female    DOB: Jun 16, 1955, 55 y.o.   MRN: [id number]  HPI 10/28/10- Asthma, allergic rhinitis Last here July 12, 2010. Since then has had resection of thyroid cancer then RAI. Wile she was indoors from that her raspy voice cleared. As she has gotten out more, her raspiness has returned, reinforcing her opinion it is allergic. Wheezing in past week. Not needing nebulizer. She is interested in [place] when her cancer bills are paid off.  Denies choking with food/ drink or obvious reflux.  01/28/11- 54 yoF former smoker followed for Asthma, allergic rhinitis She had one flare since June and we called in prednisone for her asthma. Her worst season tends to be Fall. In last week or two- more sinus headache, more wheeze, and hoarse. Denies infection and not recognizing obvious reflux or sinus drainage. She is leaning more now towards trying [facility] and will finish the paper work for application with our [facility].   Review of Systems Constitutional:   No weight loss, night sweats,  Fevers, chills, fatigue, lassitude. HEENT:   No headaches,  Difficulty swallowing,  Tooth/dental problems,               No sneezing, itching, ear ache, nasal congestion, post nasal drip,  CV:  No chest pain,  Orthopnea, PND, swelling in lower extremities, anasarca, dizziness, palpitations GI  No heartburn, indigestion, abdominal pain, nausea, vomiting, diarrhea, change in bowel habits, loss of appetite Resp: No acute shortness of breath with exertion or at rest.  No excess mucus, no productive cough,  No non-productive cough,  No coughing up of blood.  No change in color of mucus.  Skin: no rash or lesions. GU: no dysuria, change in color of urine, no urgency or frequency.  No flank  pain. MS:  No joint pain or swelling.  No decreased range of motion.  No back pain. Psych:  No change in mood or affect. No depression or anxiety.  No memory loss.      Objective:   Physical Exam General- Alert, Oriented, Affect-appropriate, Distress- none acute  Obese, slightly hoarse Skin- rash-none, lesions- none, excoriation- none Lymphadenopathy- none Head- atraumatic            Eyes- Gross vision intact, PERRLA, conjunctivae clear secretions            Ears- Hearing, canals- normal            Nose- Clear, no-Septal dev, mucus, polyps, erosion, perforation             Throat- Mallampati II , mucosa clear , drainage- none, tonsils- atrophic Neck- flexible , trachea midline, no stridor , thyroid nl, carotid no bruit Chest - symmetrical excursion , unlabored           Heart/CV- RRR , no murmur , no gallop  , no rub, nl s1 s2                           - JVD- none , edema- none, stasis changes- none, varices- none           Lung- clear to P&A, wheeze- none, cough-  none , dullness-none, rub- none           Chest wall-  Abd- tender-no, distended-no, bowel sounds-present, HSM- no Br/ Gen/ Rectal- Not done, not indicated Extrem- cyanosis- none, clubbing, none, atrophy- none, strength- nl Neuro- grossly intact to observation          Assessment & Plan:

## 2011-01-28 NOTE — Patient Instructions (Signed)
Prednisone script to hold in case needed  Flu vax  See [facility] about Xolair

## 2011-01-31 ENCOUNTER — Encounter: Payer: Self-pay | Admitting: Cardiology

## 2011-02-03 ENCOUNTER — Telehealth: Payer: Self-pay | Admitting: *Deleted

## 2011-02-03 NOTE — Telephone Encounter (Signed)
PT AWARE OF MONITOR RESULTS  PER DR CRENSHAW  MONITOR REPORTS  SINUS WITH PAC'S  AND BRIEF RUNS OF PAT AND PAF  ADD ASA  325 MG EVERY DAY AND  ALSO HAS F/U ON 02/05/11 AT 12:30  IN [place]./CY

## 2011-02-05 ENCOUNTER — Encounter: Payer: Self-pay | Admitting: Cardiology

## 2011-02-05 ENCOUNTER — Ambulatory Visit (INDEPENDENT_AMBULATORY_CARE_PROVIDER_SITE_OTHER): Payer: No Typology Code available for payment source | Admitting: Cardiology

## 2011-02-05 DIAGNOSIS — I4891 Unspecified atrial fibrillation: Secondary | ICD-10-CM

## 2011-02-05 DIAGNOSIS — I480 Paroxysmal atrial fibrillation: Secondary | ICD-10-CM

## 2011-02-05 DIAGNOSIS — I10 Essential (primary) hypertension: Secondary | ICD-10-CM

## 2011-02-05 DIAGNOSIS — R0789 Other chest pain: Secondary | ICD-10-CM

## 2011-02-05 HISTORY — DX: Paroxysmal atrial fibrillation: I48.0

## 2011-02-05 MED ORDER — DILTIAZEM HCL ER COATED BEADS 120 MG PO CP24: 120.0000 mg | ORAL_CAPSULE | Freq: Every day | ORAL | Status: DC

## 2011-02-05 NOTE — Assessment & Plan Note (Signed)
Her blood pressure controlled. Continue present medications. 

## 2011-02-05 NOTE — Assessment & Plan Note (Signed)
No further symptoms. Previous Myoview low risk. No further workup at this time. We'll most likely need to repeat study in the future.

## 2011-02-05 NOTE — Assessment & Plan Note (Signed)
Patient found to have brief runs of PAT and paroxysmal atrial fibrillation on her monitor. I would like to add a beta blocker that I am hesitant to do this because of her asthma. I will instead add Cardizem CD 120 mg daily. She has an embolic risk factor of hypertension. Continue aspirin 325 mg daily.

## 2011-02-05 NOTE — Progress Notes (Signed)
HPI: 55 year old female I saw in July of 2012 for evaluation of chest pain. Echocardiogram in January of 2012 showed normal LV function, mild left ventricular hypertrophy and mild left atrial enlargement. CTA showed no pulmonary embolus in July 2012. Patient had a Myoview in July of 2012. Her ejection fraction is 64%. Patient had mild ischemia in the inferior wall. We elected to treat medically. Patient was also having palpitations and near syncopal episodes. A CardioNet revealed sinus rhythm with PACs, brief runs of PAT and brief runs of PAF. Since I last saw her, she denies dyspnea on exertion, orthopnea or PND. She has chronic pedal edema. No chest pain. She occasionally has brief palpitations that are not sustained. No syncope.   Current Outpatient Prescriptions  Medication Sig Dispense Refill  . albuterol (PROVENTIL) (2.5 MG/3ML) 0.083% nebulizer solution 1 vial 4 times a day as needed       . albuterol (VENTOLIN HFA) 108 (90 BASE) MCG/ACT inhaler Inhale 2 puffs into the lungs every 6 (six) hours as needed for wheezing or shortness of breath.  1 Inhaler  3  . Ascorbic Acid (VITAMIN C CR) 1000 MG TBCR Take 1 twice a week      . aspirin 325 MG EC tablet Take 325 mg by mouth daily.        . budesonide-formoterol (SYMBICORT) 160-4.5 MCG/ACT inhaler Inhale 2 puffs into the lungs 2 (two) times daily.  1 Inhaler  2  . calcitRIOL (ROCALTROL) 0.5 MCG capsule 3 tablets twice a day       . calcium carbonate 200 MG capsule Take 8,000 mg by mouth daily.       . Cyanocobalamin (VITAMIN B 12 PO) 500 mcg once a week       . DIOVAN 160 MG tablet TAKE ONE TABLET BY MOUTH EVERY DAY  90 each  0  . ergocalciferol (VITAMIN D2) 50000 UNITS capsule Take 50,000 Units by mouth once a week.        . furosemide (LASIX) 40 MG tablet Take 40 mg by mouth daily.        . hydrochlorothiazide 25 MG tablet TAKE ONE TABLET BY MOUTH EVERY DAY  30 tablet  3  . iron polysaccharides (NU-IRON) 150 MG capsule Take 150 mg by mouth every  30 (thirty) days.       [name] levothyroxine (SYNTHROID, LEVOTHROID) 200 MCG tablet 525 mcg. per day      . Loratadine-Pseudoephedrine (CLARITIN-D 12 HOUR PO) As needed       . omeprazole-sodium bicarbonate (ZEGERID) 40-1100 MG per capsule TAKE ONE CAPSULE BY MOUTH EVERY DAY  90 capsule  3  . zafirlukast (ACCOLATE) 20 MG tablet Take 20 mg by mouth 2 (two) times daily.          Past Medical History  Diagnosis Date  . Allergic rhinitis   . Asthma   . Hypertension   . Hypothyroidism   . Anemia, iron deficiency   . Vitamin D deficiency   . Thyroid cancer   . Morbid obesity   . GERD (gastroesophageal reflux disease)   . Hypoparathyroidism 01/07/2011    Past Surgical History  Procedure Date  . Tonsillectomy and adenoidectomy   . Bunionectomy     bilateral  . Appendectomy   . Gastric bypass   . Thyroidectomy     History   Social History  . Marital Status: Single    Spouse Name: N/A    Number of Children: 0  . Years of Education:  N/A   Occupational History  . works in Medical laboratory scientific officer   . [employer]    Social History Main Topics  . Smoking status: Former Smoker -- 10 years  . Smokeless tobacco: Never Used  . Alcohol Use: Yes     Occasional  . Drug Use: No  . Sexually Active: Yes -- Female partner(s)   Other Topics Concern  . 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  She was light smoker for 10 years.           ROS: no fevers or chills, productive cough, hemoptysis, dysphasia, odynophagia, melena, hematochezia, dysuria, hematuria, rash, seizure activity, orthopnea, PND, pedal edema, claudication. Remaining systems are negative.  Physical Exam: Well-developed obese in no acute distress.  Skin is warm and dry.  HEENT is normal.  Neck is supple. No thyromegaly.  Chest is clear to auscultation with normal expansion.  Cardiovascular exam is  regular rate and rhythm.  Abdominal exam nontender or distended. No masses palpated. Extremities show trace to 1+ edema. neuro grossly intact

## 2011-02-05 NOTE — Patient Instructions (Signed)
Your physician recommends that you schedule a follow-up appointment in: 3 MONTHS   START CARDIZEM CD 120 MG ONCE DAILY

## 2011-02-06 ENCOUNTER — Other Ambulatory Visit: Payer: Self-pay | Admitting: Internal Medicine

## 2011-02-06 NOTE — Telephone Encounter (Signed)
Rx refill sent to pharmacy. 

## 2011-02-14 ENCOUNTER — Telehealth: Payer: Self-pay | Admitting: Internal Medicine

## 2011-02-17 NOTE — Telephone Encounter (Signed)
Returned patient's call. No answer. LMOAM for pt to return my call at [phone].

## 2011-02-18 NOTE — Telephone Encounter (Signed)
Returned pt's call and was able to speak with patient. Pt stated that she didn't qualify for the [facility] Assistance program. I asked the patient if she checked in to the copay assistance card and she stated that she just couldn't afford the xolair. She asked that I send a message back to Dr. Maple Hudson to let him know that she couldn't afford to start on Xolair.

## 2011-02-18 NOTE — Telephone Encounter (Signed)
Understand. So we will work with other meds as we see her in office follow-up.

## 2011-02-19 NOTE — Telephone Encounter (Signed)
Spoke with pt and notified of recs per CDY She verbalized understanding 

## 2011-02-27 ENCOUNTER — Other Ambulatory Visit: Payer: Self-pay | Admitting: Family

## 2011-02-28 NOTE — Telephone Encounter (Signed)
Please advise if ok to refill Accolate. If so, how many refills?

## 2011-02-28 NOTE — Telephone Encounter (Signed)
OK to give 30 day supply with 1 refill please.

## 2011-02-28 NOTE — Telephone Encounter (Signed)
REfill sent. 

## 2011-03-03 ENCOUNTER — Telehealth: Payer: Self-pay | Admitting: Family

## 2011-03-03 NOTE — Telephone Encounter (Signed)
Spoke to Joe at [facility] and gave auth to fill rx for quantity of 60 x 1 refill.

## 2011-03-03 NOTE — Telephone Encounter (Signed)
zafirlukast(accolate) 20mg  tab. Take one tablet by mouth twice daily. Qty 30  Pharmacy comments: can we have #60 to last a 30 day supply?

## 2011-04-01 ENCOUNTER — Encounter: Payer: Self-pay | Admitting: Family

## 2011-04-01 ENCOUNTER — Ambulatory Visit (INDEPENDENT_AMBULATORY_CARE_PROVIDER_SITE_OTHER): Payer: No Typology Code available for payment source | Admitting: Family

## 2011-04-01 DIAGNOSIS — R600 Localized edema: Secondary | ICD-10-CM

## 2011-04-01 DIAGNOSIS — M545 Low back pain, unspecified: Secondary | ICD-10-CM

## 2011-04-01 DIAGNOSIS — I4891 Unspecified atrial fibrillation: Secondary | ICD-10-CM

## 2011-04-01 DIAGNOSIS — D649 Anemia, unspecified: Secondary | ICD-10-CM

## 2011-04-01 DIAGNOSIS — E559 Vitamin D deficiency, unspecified: Secondary | ICD-10-CM

## 2011-04-01 DIAGNOSIS — I10 Essential (primary) hypertension: Secondary | ICD-10-CM

## 2011-04-01 DIAGNOSIS — J45909 Unspecified asthma, uncomplicated: Secondary | ICD-10-CM

## 2011-04-01 DIAGNOSIS — E209 Hypoparathyroidism, unspecified: Secondary | ICD-10-CM

## 2011-04-01 DIAGNOSIS — D509 Iron deficiency anemia, unspecified: Secondary | ICD-10-CM

## 2011-04-01 DIAGNOSIS — J309 Allergic rhinitis, unspecified: Secondary | ICD-10-CM

## 2011-04-01 DIAGNOSIS — E039 Hypothyroidism, unspecified: Secondary | ICD-10-CM

## 2011-04-01 DIAGNOSIS — R609 Edema, unspecified: Secondary | ICD-10-CM

## 2011-04-01 LAB — BASIC METABOLIC PANEL
CO2: 26 mEq/L (ref 19–32)
Calcium: 8.5 mg/dL (ref 8.4–10.5)
Potassium: 4 mEq/L (ref 3.5–5.3)
Sodium: 139 mEq/L (ref 135–145)

## 2011-04-01 LAB — IRON AND TIBC
%SAT: 13 % — ABNORMAL LOW (ref 20–55)
Iron: 41 ug/dL — ABNORMAL LOW (ref 42–145)
UIBC: 268 ug/dL (ref 125–400)

## 2011-04-01 MED ORDER — FLUTICASONE PROPIONATE 50 MCG/ACT NA SUSP: 2.0000 | Freq: Every day | NASAL | Status: DC

## 2011-04-01 MED ORDER — CYCLOBENZAPRINE HCL 5 MG PO TABS: 5.0000 mg | ORAL_TABLET | Freq: Three times a day (TID) | ORAL | Status: AC | PRN

## 2011-04-01 NOTE — Assessment & Plan Note (Signed)
Will add flonase to her regimen to help with her sinus congestion.  Due to her hx or AF I recommended that she discontinue sudafed containing products. Change Claritin D to plain claritin.

## 2011-04-01 NOTE — Progress Notes (Signed)
Subjective:    Patient ID: [name], female    DOB: [birth year], 55 y.o.   MRN: [id number]  HPI  Ms.  Priego is a 55 yr old female who presents today for follow up.  HTN- She reports that she is tolerating medications without difficulty.  Dr.  Jens Som placed her on Cardizem due to hx of PAF.    Low back pain- notes that Friday night her back started hurting.  Pain is in the lower mid back. Non-radiating.  She has been using aleve with slight improvement in her symptoms.  Edema- L>R continues.  She continues her diuretics.  Asthma- controlled.  She is using her albuterol PRN. Continues symbicort.  Hypothyroid/Hypoparathyroid- this is being managed by Dr. Sharl Ma.  Pt reports that this is well controlled.    GERD- stable on Zegerid. Notes that certain foods such as red sauce exacerbate her symptoms  Sinusitus- She reports + dizziness.  She reports cloudy nasal discharge. Continues claritin D.   Anemia- she continues iron. Dr. Myna Hidalgo "signed off."  Noted that she gets some bruising when she takes the aspirin every day so she has been taking every other day.     Review of Systems See HPI  Past Medical History  Diagnosis Date  . Allergic rhinitis   . Asthma   . Hypertension   . Hypothyroidism   . Anemia, iron deficiency   . Vitamin D deficiency   . Thyroid cancer   . Morbid obesity   . GERD (gastroesophageal reflux disease)   . Hypoparathyroidism 01/07/2011    History   Social History  . Marital Status: Single    Spouse Name: N/A    Number of Children: 0  . Years of Education: N/A   Occupational History  . works in Medical laboratory scientific officer   . [employer]    Social History Main Topics  . Smoking status: Former Smoker -- 10 years  . Smokeless tobacco: Never Used  . Alcohol Use: Yes     Occasional  . Drug Use: No  . Sexually Active: Yes -- Female partner(s)   Other Topics Concern  . 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  She was light smoker for 10 years.           Past Surgical History  Procedure Date  . Tonsillectomy and adenoidectomy   . Bunionectomy     bilateral  . Appendectomy   . Gastric bypass   . Thyroidectomy     Family History  Problem Relation Age of Onset  . Hypertension Father   . Diabetes Father   . Lung cancer Father   . Heart attack Father     MI at age 25  . Hypertension Mother   . Hyperthyroidism Mother   . Heart disease Mother   . Heart attack Mother     MI at age 64  . Asthma Brother     Allergies  Allergen Reactions  . FAO:ZHYQMVHQION+GEXBMWUXL+KGMWNUUVOZ Acid+Aspartame   . Penicillins     Current Outpatient Prescriptions on File Prior to Visit  Medication Sig Dispense Refill  . albuterol (PROVENTIL) (2.5 MG/3ML) 0.083% nebulizer solution 1 vial 4 times a day as needed       . albuterol (VENTOLIN HFA) 108 (90 BASE) MCG/ACT inhaler Inhale 2 puffs into the lungs every 6 (six) hours as needed for  wheezing or shortness of breath.  1 Inhaler  3  . Ascorbic Acid (VITAMIN C CR) 1000 MG TBCR Take 1 twice a week      . aspirin 325 MG EC tablet Take 325 mg by mouth daily.        . budesonide-formoterol (SYMBICORT) 160-4.5 MCG/ACT inhaler Inhale 2 puffs into the lungs 2 (two) times daily.  1 Inhaler  2  . calcitRIOL (ROCALTROL) 0.5 MCG capsule 3 tablets twice a day       . Cyanocobalamin (VITAMIN B 12 PO) Take 1 tablet by mouth every 3 (three) days. 500 mcg once a week       . diltiazem (CARDIZEM CD) 120 MG 24 hr capsule Take 1 capsule (120 mg total) by mouth daily.  30 capsule  11  . ergocalciferol (VITAMIN D2) 50000 UNITS capsule Take 50,000 Units by mouth once a week.        . furosemide (LASIX) 40 MG tablet TAKE ONE TABLET BY MOUTH EVERY DAY  90 tablet  1  . hydrochlorothiazide 25 MG tablet TAKE ONE TABLET BY MOUTH EVERY DAY  30 tablet  3  . iron polysaccharides (NU-IRON) 150  MG capsule Take 150 mg by mouth every 3 (three) days.       [name] levothyroxine (SYNTHROID, LEVOTHROID) 200 MCG tablet Take 600 mcg by mouth daily.       [name] omeprazole-sodium bicarbonate (ZEGERID) 40-1100 MG per capsule TAKE ONE CAPSULE BY MOUTH EVERY DAY  90 capsule  3  . zafirlukast (ACCOLATE) 20 MG tablet TAKE ONE TABLET BY MOUTH TWICE DAILY  30 tablet  1    BP 100/70  Pulse 86  Temp(Src) 98.6 F (37 C) (Oral)  Resp 16  Ht 5\' 4"  (1.626 m)  Wt 329 lb 0.6 oz (149.252 kg)  BMI 56.48 kg/m2  SpO2 95%  LMP 05/26/2009       Objective:   Physical Exam  Constitutional: She appears well-developed and well-nourished. No distress.  HENT:  Head: Normocephalic and atraumatic.  Eyes: Conjunctivae are normal.  Neck: Normal range of motion. Neck supple.  Cardiovascular: Normal rate and regular rhythm.   No murmur heard. Pulmonary/Chest: Effort normal and breath sounds normal. No respiratory distress. She has no wheezes. She has no rales. She exhibits no tenderness.  Musculoskeletal:       3+ swelling of the left leg.  1-2 + swelling of the right.  Skin: No ecchymosis noted.  Psychiatric: She has a normal mood and affect. Her speech is normal and behavior is normal. Judgment and thought content normal. Cognition and memory are normal.          Assessment & Plan:

## 2011-04-01 NOTE — Assessment & Plan Note (Signed)
Being managed by Dr. Sharl Ma. Obtain BMET today.

## 2011-04-01 NOTE — Assessment & Plan Note (Signed)
Likely lumbar strain.  Recommended that she use Aleve BID for next week or so and add PRN flexeril.  She is advised not to drive while using flexeril due to sedation.

## 2011-04-01 NOTE — Assessment & Plan Note (Signed)
Continue vitamin D. Check level next visit.

## 2011-04-01 NOTE — Assessment & Plan Note (Signed)
Rate is stable on Cardizem CD.  Recommended to pt that she resume full dose aspirin.  Management per cardiology.

## 2011-04-01 NOTE — Assessment & Plan Note (Signed)
Clinically stable on Accolate, Symbicort, albuterol. Continue same.

## 2011-04-01 NOTE — Assessment & Plan Note (Signed)
She was following with Dr. Myna Hidalgo but he signed off on her care.  Check CBC and iron level.

## 2011-04-01 NOTE — Assessment & Plan Note (Signed)
This is being managed by Dr. Sharl Ma, endocrinology.

## 2011-04-01 NOTE — Assessment & Plan Note (Signed)
This is chronic and unchanged. She has had several LLE dopplers in the past which were neg for DVT. Continue lasix.

## 2011-04-01 NOTE — Patient Instructions (Signed)
Please check your blood pressure once daily for 1 week and call us with your readings. Complete your lab work prior to leaving today.  You may continue Aleve 220mg  twice daily for the next 1-2 weeks. Do not drive while taking flexeril.

## 2011-04-02 ENCOUNTER — Telehealth: Payer: Self-pay | Admitting: Family

## 2011-04-02 LAB — CBC WITH DIFFERENTIAL/PLATELET
Eosinophils Absolute: 0.2 10*3/uL (ref 0.0–0.7)
HCT: 38.8 % (ref 36.0–46.0)
Hemoglobin: 11.8 g/dL — ABNORMAL LOW (ref 12.0–15.0)
Lymphs Abs: 1.8 10*3/uL (ref 0.7–4.0)
MCH: 25.7 pg — ABNORMAL LOW (ref 26.0–34.0)
MCHC: 30.4 g/dL (ref 30.0–36.0)
MCV: 84.5 fL (ref 78.0–100.0)
Monocytes Absolute: 0.5 10*3/uL (ref 0.1–1.0)
Monocytes Relative: 4 % (ref 3–12)
Neutrophils Relative %: 79 % — ABNORMAL HIGH (ref 43–77)
RBC: 4.59 MIL/uL (ref 3.87–5.11)

## 2011-04-02 NOTE — Telephone Encounter (Signed)
Pls call pt and let her know that she is mildly anemic and iron level slightly low.  She should increase her iron supplement from once every 3 days to once daily.  Follow up in February as scheduled.

## 2011-04-04 ENCOUNTER — Other Ambulatory Visit: Payer: Self-pay | Admitting: *Deleted

## 2011-04-04 MED ORDER — VALSARTAN 160 MG PO TABS: 80.0000 mg | ORAL_TABLET | Freq: Every day | ORAL | Status: DC

## 2011-04-04 NOTE — Telephone Encounter (Signed)
Pt called stating she was told to take Diovan 1/2 tablet daily but no new Rx was sent to her pharmacy. Pt will need rx with new directions sent to [facility] if this is what she is to continue. Please advise.

## 2011-04-04 NOTE — Telephone Encounter (Signed)
Left message on pt's cell re: rx completion and to call me on Monday re: 04/02/11 phone note instructions.

## 2011-04-04 NOTE — Telephone Encounter (Signed)
rx sent

## 2011-04-07 ENCOUNTER — Other Ambulatory Visit: Payer: Self-pay | Admitting: Internal Medicine

## 2011-04-07 DIAGNOSIS — C73 Malignant neoplasm of thyroid gland: Secondary | ICD-10-CM

## 2011-04-07 DIAGNOSIS — Z85850 Personal history of malignant neoplasm of thyroid: Secondary | ICD-10-CM

## 2011-04-07 HISTORY — DX: Personal history of malignant neoplasm of thyroid: Z85.850

## 2011-04-07 NOTE — Telephone Encounter (Signed)
Pt.notified

## 2011-04-14 ENCOUNTER — Encounter (HOSPITAL_COMMUNITY)
Admission: RE | Admit: 2011-04-14 | Discharge: 2011-04-14 | Disposition: A | Discharge status: Home / Self Care | Payer: No Typology Code available for payment source | Source: Ambulatory Visit | Attending: Internal Medicine | Admitting: Internal Medicine

## 2011-04-14 DIAGNOSIS — C73 Malignant neoplasm of thyroid gland: Secondary | ICD-10-CM

## 2011-04-14 MED ORDER — THYROTROPIN ALFA 1.1 MG IM SOLR
0.9000 mg | INTRAMUSCULAR | Status: AC
  Administered 2011-04-14: 0.9 mg via INTRAMUSCULAR
  Filled 2011-04-14: qty 0.9

## 2011-04-15 ENCOUNTER — Ambulatory Visit (HOSPITAL_COMMUNITY)
Admission: RE | Admit: 2011-04-15 | Discharge: 2011-04-15 | Payer: No Typology Code available for payment source | Source: Ambulatory Visit | Attending: Internal Medicine | Admitting: Internal Medicine

## 2011-04-15 MED ORDER — THYROTROPIN ALFA 1.1 MG IM SOLR
0.9000 mg | INTRAMUSCULAR | Status: AC
  Administered 2011-04-15: 0.9 mg via INTRAMUSCULAR
  Filled 2011-04-15: qty 0.9

## 2011-04-16 ENCOUNTER — Ambulatory Visit (HOSPITAL_COMMUNITY): Admission: RE | Admit: 2011-04-16 | Payer: No Typology Code available for payment source | Source: Ambulatory Visit

## 2011-04-18 ENCOUNTER — Ambulatory Visit (HOSPITAL_COMMUNITY)
Admission: RE | Admit: 2011-04-18 | Discharge: 2011-04-18 | Disposition: A | Discharge status: Home / Self Care | Payer: No Typology Code available for payment source | Source: Ambulatory Visit | Attending: Internal Medicine | Admitting: Internal Medicine

## 2011-04-18 DIAGNOSIS — C73 Malignant neoplasm of thyroid gland: Secondary | ICD-10-CM | POA: Insufficient documentation

## 2011-04-18 MED ORDER — SODIUM IODIDE I 131 CAPSULE
4.0000 | Freq: Once | INTRAVENOUS | Status: AC | PRN
  Administered 2011-04-18: 4 via ORAL

## 2011-04-21 LAB — THYROGLOBULIN ANTIBODY: Thyroglobulin Ab: <20 U/mL (ref <40.0)

## 2011-04-22 ENCOUNTER — Other Ambulatory Visit: Payer: Self-pay | Admitting: *Deleted

## 2011-04-22 MED ORDER — VALSARTAN 80 MG PO TABS: 80.0000 mg | ORAL_TABLET | Freq: Every day | ORAL | Status: DC

## 2011-04-22 NOTE — Telephone Encounter (Signed)
Received message from pt that she tried taking a half tablet of Diovan and was unable to tolerate it as she felt like she was choking. Pt states she increased it to a whole tablet and has not had any problems. Pt wants rx sent to pharmacy with new directions of 1 tablet daily. Please advise.

## 2011-04-22 NOTE — Telephone Encounter (Signed)
Sent rx for diovan 80 mg tabs, one tablet once daily.  She should not take a full tablet of the 160- it is too strong for her.

## 2011-04-23 ENCOUNTER — Encounter (INDEPENDENT_AMBULATORY_CARE_PROVIDER_SITE_OTHER): Payer: Self-pay | Admitting: Surgery

## 2011-04-23 NOTE — Telephone Encounter (Signed)
Attempted to reach pt and left detailed message re: instructions below and to call if any questions.

## 2011-04-24 ENCOUNTER — Encounter (INDEPENDENT_AMBULATORY_CARE_PROVIDER_SITE_OTHER): Payer: Self-pay | Admitting: Surgery

## 2011-04-24 ENCOUNTER — Ambulatory Visit (INDEPENDENT_AMBULATORY_CARE_PROVIDER_SITE_OTHER): Payer: No Typology Code available for payment source | Admitting: Surgery

## 2011-04-24 VITALS — BP 130/76 | HR 60 | Temp 97.2°F | Resp 18 | Ht 64.0 in | Wt 332.4 lb

## 2011-04-24 DIAGNOSIS — C73 Malignant neoplasm of thyroid gland: Secondary | ICD-10-CM

## 2011-04-24 NOTE — Progress Notes (Signed)
Visit Diagnoses: 1. Papillary thyroid carcinoma     HISTORY: Patient is a 55 year old white female now one year status post total thyroidectomy and limited lymph node dissection for papillary thyroid carcinoma. She continues to be followed closely by her endocrinologist. She is taking Synthroid 175 mcg tablets, 3 tablets daily. Recent total body iodine scan was negative for metastatic disease. Thyroglobulin level remains undetectable. Patient returns today for scheduled followup.   PERTINENT REVIEW OF SYSTEMS: Recent palpitations with higher dose of Synthroid, now resolved. Minimal tremor. Continued mild hoarseness which was present preoperatively.   EXAM: HEENT: normocephalic; pupils equal and reactive; sclerae clear; dentition good; mucous membranes moist NECK:  No palpable nodules in thyroid bed; well-healed incision; symmetric on extension; no palpable anterior or posterior cervical lymphadenopathy; no supraclavicular masses; no tenderness CHEST: clear to auscultation bilaterally without rales, rhonchi, or wheezes CARDIAC: regular rate and rhythm without significant murmur; peripheral pulses are full EXT:  non-tender without edema; no deformity NEURO: no gross focal deficits; mild tremor   IMPRESSION: Personal history of papillary thyroid carcinoma, no evidence of recurrent disease   PLAN: The patient and I reviewed the above results. Her physical exam is negative for any sign of recurrent disease. Thyroglobulin level is undetectable. Total body iodine scan is negative.  Patient will continue close followup. I will see her back for a final visit in one year.   Velora Heckler, MD, FACS General & Endocrine Surgery [facility]

## 2011-04-28 ENCOUNTER — Telehealth: Payer: Self-pay | Admitting: Family

## 2011-04-28 MED ORDER — OMEPRAZOLE-SODIUM BICARBONATE 40-1100 MG PO CAPS: 1.0000 | ORAL_CAPSULE | Freq: Every day | ORAL | Status: DC

## 2011-04-28 NOTE — Telephone Encounter (Signed)
Left detailed message on pt's cell# and to call if any questions. 

## 2011-04-28 NOTE — Telephone Encounter (Signed)
Omeprazole-sodium bicarbonate(zegerid) 40-1100mg  per capsule.   Pharmacy comments: this product has been discontinued. What would you like to switch this to?

## 2011-04-28 NOTE — Telephone Encounter (Signed)
Please advise 

## 2011-04-28 NOTE — Telephone Encounter (Signed)
I checked with our pharmacy downstairs and they have it in stock.  I will send rx down there.

## 2011-04-29 ENCOUNTER — Encounter: Payer: Self-pay | Admitting: Cardiology

## 2011-04-30 ENCOUNTER — Encounter: Payer: Self-pay | Admitting: Cardiology

## 2011-04-30 ENCOUNTER — Ambulatory Visit (INDEPENDENT_AMBULATORY_CARE_PROVIDER_SITE_OTHER): Payer: No Typology Code available for payment source | Admitting: Cardiology

## 2011-04-30 VITALS — BP 136/78 | HR 90 | Ht 64.0 in | Wt 328.0 lb

## 2011-04-30 DIAGNOSIS — I10 Essential (primary) hypertension: Secondary | ICD-10-CM

## 2011-04-30 DIAGNOSIS — I4891 Unspecified atrial fibrillation: Secondary | ICD-10-CM

## 2011-04-30 DIAGNOSIS — R9430 Abnormal result of cardiovascular function study, unspecified: Secondary | ICD-10-CM

## 2011-04-30 MED ORDER — DILTIAZEM HCL ER COATED BEADS 240 MG PO CP24: 240.0000 mg | ORAL_CAPSULE | Freq: Every day | ORAL | Status: DC

## 2011-04-30 NOTE — Assessment & Plan Note (Signed)
Previous Myoview low risk. She will most likely need follow up studies in the future. Medical therapy for now.

## 2011-04-30 NOTE — Progress Notes (Signed)
HPI: Pleasant female I saw in July of 2012 for evaluation of chest pain. Echocardiogram in January of 2012 showed normal LV function, mild left ventricular hypertrophy and mild left atrial enlargement. CTA showed no pulmonary embolus in July 2012. Patient had a Myoview in July of 2012. Her ejection fraction is 64%. Patient had mild ischemia in the inferior wall. We elected to treat medically. Patient was also having palpitations and near syncopal episodes. A CardioNet revealed sinus rhythm with PACs, brief runs of PAT and brief runs of PAF. Since I last saw her in Sept 2012, she denies dyspnea or chest pain. She has chronic mild pedal edema. She has had 3 separate episodes of palpitations lasting approximately 1 minute. No history of bleeding.   Current Outpatient Prescriptions  Medication Sig Dispense Refill  . albuterol (PROVENTIL) (2.5 MG/3ML) 0.083% nebulizer solution 1 vial 4 times a day as needed       . albuterol (VENTOLIN HFA) 108 (90 BASE) MCG/ACT inhaler Inhale 2 puffs into the lungs every 6 (six) hours as needed for wheezing or shortness of breath.  1 Inhaler  3  . Ascorbic Acid (VITAMIN C CR) 1000 MG TBCR Take 1 twice a week      . aspirin 325 MG EC tablet Take 325 mg by mouth daily.        . budesonide-formoterol (SYMBICORT) 160-4.5 MCG/ACT inhaler Inhale 2 puffs into the lungs 2 (two) times daily.  1 Inhaler  2  . calcitRIOL (ROCALTROL) 0.5 MCG capsule 3 tablets twice a day       . Cyanocobalamin (VITAMIN B 12 PO) Take 1 tablet by mouth every 3 (three) days. 500 mcg once a week       . diltiazem (CARDIZEM CD) 120 MG 24 hr capsule Take 1 capsule (120 mg total) by mouth daily.  30 capsule  11  . ergocalciferol (VITAMIN D2) 50000 UNITS capsule Take 50,000 Units by mouth once a week.        . fluticasone (FLONASE) 50 MCG/ACT nasal spray Place 2 sprays into the nose daily.  1 g  2  . furosemide (LASIX) 40 MG tablet TAKE ONE TABLET BY MOUTH EVERY DAY  90 tablet  1  . glucosamine-chondroitin  500-400 MG tablet Take 1 tablet by mouth daily.        . hydrochlorothiazide 25 MG tablet TAKE ONE TABLET BY MOUTH EVERY DAY  30 tablet  3  . iron polysaccharides (NU-IRON) 150 MG capsule Take 150 mg by mouth every 3 (three) days.       [name] levothyroxine (SYNTHROID, LEVOTHROID) 200 MCG tablet Take 475 mcg by mouth daily.       [name] loratadine (CLARITIN) 10 MG tablet 10 mg.        . omeprazole-sodium bicarbonate (ZEGERID) 40-1100 MG per capsule Take 1 capsule by mouth daily before breakfast.  90 capsule  1  . valsartan (DIOVAN) 80 MG tablet Take 1 tablet (80 mg total) by mouth daily.  30 tablet  2  . zafirlukast (ACCOLATE) 20 MG tablet TAKE ONE TABLET BY MOUTH TWICE DAILY  30 tablet  1  . calcium elemental as carbonate (BARIATRIC TUMS ULTRA) 400 MG tablet Chew 4,000 mg by mouth 4 (four) times daily.           Past Medical History  Diagnosis Date  . Allergic rhinitis   . Asthma   . Hypertension   . Hypothyroidism   . Anemia, iron deficiency   . Vitamin D deficiency   .  Thyroid cancer   . Morbid obesity   . GERD (gastroesophageal reflux disease)   . Hypoparathyroidism 01/07/2011  . Arthritis   . Atrial fibrillation August 2012    Past Surgical History  Procedure Date  . Tonsillectomy and adenoidectomy   . Bunionectomy     bilateral  . Appendectomy   . Gastric bypass   . Thyroidectomy 05/15/2010    History   Social History  . Marital Status: Single    Spouse Name: N/A    Number of Children: 0  . Years of Education: N/A   Occupational History  . works in Medical laboratory scientific officer   . [employer]    Social History Main Topics  . Smoking status: Former Smoker -- 10 years  . Smokeless tobacco: Never Used  . Alcohol Use: Yes     Occasional - one to three per month  . Drug Use: No  . Sexually Active: Yes -- Female partner(s)   Other Topics Concern  . 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  She was light smoker for 10 years.           ROS: no fevers or chills, productive cough, hemoptysis, dysphasia, odynophagia, melena, hematochezia, dysuria, hematuria, rash, seizure activity, orthopnea, PND, pedal edema, claudication. Remaining systems are negative.  Physical Exam: Well-developed obese in no acute distress.  Skin is warm and dry.  HEENT is normal.  Neck is supple. No thyromegaly.  Chest is clear to auscultation with normal expansion.  Cardiovascular exam is regular rate and rhythm.  Abdominal exam nontender or distended. No masses palpated. Extremities show trace edema. neuro grossly intact  ECG NSR, no ST changes

## 2011-04-30 NOTE — Assessment & Plan Note (Signed)
Blood pressure controlled. Continue present medications. 

## 2011-04-30 NOTE — Patient Instructions (Signed)
Your physician wants you to follow-up in: 6 MONTHS You will receive a reminder letter in the mail two months in advance. If you don't receive a letter, please call our office to schedule the follow-up appointment.   INCREASE DILTIAZEM 240 MG ONCE DAILY  Your physician recommends that you return for lab work in: TODAY  PRADAXA 150 MG TWICE DAILY=ASK INSURANCE AND THEN CALL

## 2011-04-30 NOTE — Assessment & Plan Note (Signed)
Patient is in sinus rhythm today. She has had 3 brief episodes of palpitations most likely from her paroxysmal atrial fibrillation. Increase Cardizem to 240 mg daily. If her episodes become more frequent or prolonged in the future we will consider an antiarrhythmic such as flecainide. She has embolic risk factors of hypertension and female sex. She will check with her insurance company to see if pradaxa is covered. If so we will treat with this medication and discontinue aspirin. Check CBC and renal function. If this medication is not covered we will begin Coumadin with goal INR 2-3.

## 2011-05-01 ENCOUNTER — Ambulatory Visit: Payer: No Typology Code available for payment source | Admitting: Internal Medicine

## 2011-05-02 ENCOUNTER — Telehealth: Payer: Self-pay | Admitting: *Deleted

## 2011-05-02 NOTE — Telephone Encounter (Signed)
Pt advised to try OTC Zegerid and call if no improvement of symptoms. Pt voices understanding.

## 2011-05-02 NOTE — Telephone Encounter (Signed)
OK to resend zegerid rx to [facility] on [address].

## 2011-05-02 NOTE — Telephone Encounter (Signed)
I agree with otc zegerid.

## 2011-05-02 NOTE — Telephone Encounter (Signed)
Spoke with pharmacist, he states that rx is not covered under pt's insurance. Thinks this is due to zegerid being available OTC. Will advise pt to try OTC zegerid. Any additional instructions?

## 2011-05-02 NOTE — Telephone Encounter (Signed)
Received call from pt stating she will be unable to pick up zegerid from our pharmacy and wants OTC equivalent or an alternative called to [facility] on [address]. Please advise.

## 2011-05-05 ENCOUNTER — Ambulatory Visit: Payer: No Typology Code available for payment source | Admitting: Internal Medicine

## 2011-05-29 ENCOUNTER — Other Ambulatory Visit: Payer: Self-pay | Admitting: Family

## 2011-06-09 ENCOUNTER — Other Ambulatory Visit: Payer: Self-pay | Admitting: Family

## 2011-06-18 ENCOUNTER — Telehealth: Payer: Self-pay | Admitting: Family

## 2011-06-18 MED ORDER — HYDROCHLOROTHIAZIDE 25 MG PO TABS: 25.0000 mg | ORAL_TABLET | Freq: Every day | ORAL | Status: DC

## 2011-06-18 NOTE — Telephone Encounter (Signed)
Refill sent to pharmacy for HCTZ #30 x no refills. Pt has f/u on 07/02/11.

## 2011-07-02 ENCOUNTER — Ambulatory Visit (INDEPENDENT_AMBULATORY_CARE_PROVIDER_SITE_OTHER): Payer: No Typology Code available for payment source | Admitting: Family

## 2011-07-02 ENCOUNTER — Telehealth: Payer: Self-pay | Admitting: Cardiology

## 2011-07-02 ENCOUNTER — Encounter: Payer: Self-pay | Admitting: Family

## 2011-07-02 DIAGNOSIS — J45909 Unspecified asthma, uncomplicated: Secondary | ICD-10-CM

## 2011-07-02 DIAGNOSIS — I10 Essential (primary) hypertension: Secondary | ICD-10-CM

## 2011-07-02 DIAGNOSIS — E559 Vitamin D deficiency, unspecified: Secondary | ICD-10-CM

## 2011-07-02 DIAGNOSIS — R635 Abnormal weight gain: Secondary | ICD-10-CM

## 2011-07-02 DIAGNOSIS — I4891 Unspecified atrial fibrillation: Secondary | ICD-10-CM

## 2011-07-02 DIAGNOSIS — D509 Iron deficiency anemia, unspecified: Secondary | ICD-10-CM

## 2011-07-02 MED ORDER — DABIGATRAN ETEXILATE MESYLATE 150 MG PO CAPS: 150.0000 mg | ORAL_CAPSULE | Freq: Two times a day (BID) | ORAL | Status: DC

## 2011-07-02 NOTE — Assessment & Plan Note (Signed)
-   Obtain vitamin D level

## 2011-07-02 NOTE — Assessment & Plan Note (Signed)
She continues iron supplement. Obtain  CBC, iron level.

## 2011-07-02 NOTE — Assessment & Plan Note (Signed)
Stable, continue current meds 

## 2011-07-02 NOTE — Telephone Encounter (Signed)
New Problem:     Patient called in wanting a prescription of pradaxa written. Says that she called her insurance company but they did not give her any information about it, so she will need a prescription to see if she can afford it. Please call back.

## 2011-07-02 NOTE — Telephone Encounter (Signed)
Spoke with pt, script for pradaxa sent to [facility] to see if pt can afford.

## 2011-07-02 NOTE — Progress Notes (Signed)
Subjective:    Patient ID: [name], female    DOB: Jan 07, 1956, 56 y.o.   MRN: [id number]  HPI  [name] is a 56 yr old female who presents today for follow up.   1) HTN-  She continues diovan, cardizem (recently increased by cardiology) and HCTZ.  2) Weight gain- she is frustrated by recent weight gain. Reports that she has been staying on track with weight watchers.  But still gaining.  Not exercising, knee bothers her when she walks.  3) AF- she reports that her palpitations have improved since Dr. Jens Som increased her cardizem. She continues a full dose aspirin.  She is contemplating switching to pradaxa but is nervous about it.  4) Asthma- reports that this is well controlled, but that she has recently increased her symbicort to 160.     Review of Systems    see HPI  Past Medical History  Diagnosis Date  . Allergic rhinitis   . Asthma   . Hypertension   . Hypothyroidism   . Anemia, iron deficiency   . Vitamin d deficiency   . Thyroid cancer   . Morbid obesity   . GERD (gastroesophageal reflux disease)   . Hypoparathyroidism 01/07/2011  . Arthritis   . Atrial fibrillation August 2012    History   Social History  . Marital Status: Single    Spouse Name: N/A    Number of Children: 0  . Years of Education: N/A   Occupational History  . works in Medical laboratory scientific officer   . [employer]    Social History Main Topics  . Smoking status: Former Smoker -- 10 years  . Smokeless tobacco: Never Used  . Alcohol Use: Yes     Occasional - one to three per month  . Drug Use: No  . Sexually Active: Yes -- Female partner(s)   Other Topics Concern  . 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  She was light smoker for 10 years.           Past Surgical History  Procedure Date  . Tonsillectomy and adenoidectomy   . Bunionectomy     bilateral   . Appendectomy   . Gastric bypass   . Thyroidectomy 05/15/2010    Family History  Problem Relation Age of Onset  . Hypertension Father   . Diabetes Father   . Lung cancer Father   . Heart attack Father     MI at age 57  . Hypertension Mother   . Hyperthyroidism Mother   . Heart disease Mother   . Heart attack Mother     MI at age 67  . Asthma Brother     Allergies  Allergen Reactions  . WUJ:[id number] Acid+Aspartame   . Penicillins     Current Outpatient Prescriptions on File Prior to Visit  Medication Sig Dispense Refill  . albuterol (PROVENTIL) (2.5 MG/3ML) 0.083% nebulizer solution 1 vial 4 times a day as needed       . albuterol (VENTOLIN HFA) 108 (90 BASE) MCG/ACT inhaler Inhale 2 puffs into the lungs every 6 (six) hours as needed for wheezing or shortness of breath.  1 Inhaler  3  . Ascorbic Acid (VITAMIN C CR) 1000 MG TBCR Take 1 twice a week      . aspirin 325 MG EC tablet Take 325 mg by  mouth daily.        . calcitRIOL (ROCALTROL) 0.5 MCG capsule 3 tablets twice a day       . calcium elemental as carbonate (BARIATRIC TUMS ULTRA) 400 MG tablet Chew 4,000 mg by mouth 4 (four) times daily.        . Cyanocobalamin (VITAMIN B 12 PO) Take 1 tablet by mouth every 3 (three) days. 500 mcg once a week       . diltiazem (CARDIZEM CD) 240 MG 24 hr capsule Take 1 capsule (240 mg total) by mouth daily.  30 capsule  11  . ergocalciferol (VITAMIN D2) 50000 UNITS capsule Take 50,000 Units by mouth once a week.        . fluticasone (FLONASE) 50 MCG/ACT nasal spray Place 2 sprays into the nose daily.  1 g  2  . furosemide (LASIX) 40 MG tablet TAKE ONE TABLET BY MOUTH EVERY DAY  90 tablet  1  . glucosamine-chondroitin 500-400 MG tablet Take 1 tablet by mouth daily.        . hydrochlorothiazide (HYDRODIURIL) 25 MG tablet Take 1 tablet (25 mg total) by mouth daily.  30 tablet  0  . iron polysaccharides (NU-IRON) 150 MG capsule Take 150 mg by mouth every 3 (three)  days.       [name] loratadine (CLARITIN) 10 MG tablet 10 mg.        . Omeprazole-Sodium Bicarbonate (ZEGERID) 20-1100 MG CAPS Take 1 capsule by mouth daily before breakfast.        . SYMBICORT 160-4.5 MCG/ACT inhaler INHALE TWO PUFFS BY MOUTH TWICE DAILY  11 g  2  . valsartan (DIOVAN) 80 MG tablet Take 1 tablet (80 mg total) by mouth daily.  30 tablet  2  . zafirlukast (ACCOLATE) 20 MG tablet TAKE ONE TABLET BY MOUTH TWICE DAILY  60 tablet  1    BP 130/84  Pulse 83  Temp(Src) 97.9 F (36.6 C) (Oral)  Resp 18  Ht 5\' 4"  (1.626 m)  Wt 331 lb (150.141 kg)  BMI 56.82 kg/m2  SpO2 95%  LMP 05/26/2009    Objective:   Physical Exam  Constitutional: She is oriented to person, place, and time. She appears well-developed and well-nourished. No distress.  Cardiovascular: Normal rate and regular rhythm.   No murmur heard. Pulmonary/Chest: Effort normal and breath sounds normal. No respiratory distress. She has no wheezes. She has no rales. She exhibits no tenderness.  Musculoskeletal: She exhibits no edema.  Neurological: She is alert and oriented to person, place, and time.  Skin: Skin is warm and dry. No rash noted. No erythema. No pallor.  Psychiatric: She has a normal mood and affect. Her behavior is normal. Judgment and thought content normal.          Assessment & Plan:

## 2011-07-02 NOTE — Assessment & Plan Note (Signed)
We discussed the importance of adding exercise to her routine. We discussed pool exercise/walking as a good alternative for her with her knee pain. We also discussed further cutting back her calorie intake slightly.

## 2011-07-02 NOTE — Assessment & Plan Note (Signed)
Asthma is well controlled.  Continue Symbicort, Accolate.

## 2011-07-02 NOTE — Patient Instructions (Signed)
Please complete your blood work prior to leaving.  Follow up in 3 months. 

## 2011-07-02 NOTE — Assessment & Plan Note (Signed)
We discussed risk of embolic stroke in setting of AF.  I encouraged her to talk more with Dr. Jens Som about her risk. She continues Aspirin.

## 2011-07-03 ENCOUNTER — Encounter: Payer: Self-pay | Admitting: Family

## 2011-07-03 ENCOUNTER — Telehealth: Payer: Self-pay | Admitting: Family

## 2011-07-03 LAB — CBC
Hemoglobin: 12.5 g/dL (ref 12.0–15.0)
MCV: 81.3 fL (ref 78.0–100.0)
Platelets: 219 10*3/uL (ref 150–400)
RBC: 5.07 MIL/uL (ref 3.87–5.11)
WBC: 10.9 10*3/uL — ABNORMAL HIGH (ref 4.0–10.5)

## 2011-07-03 MED ORDER — ERGOCALCIFEROL 1.25 MG (50000 UT) PO CAPS: 50000.0000 [IU] | ORAL_CAPSULE | ORAL | Status: DC

## 2011-07-03 NOTE — Telephone Encounter (Signed)
Pls call pt and let her know that her vitamin d level is slightly low.  She should continue the vitamin D 50,000 units weekly.  Anemia is resolved, iron level looks good. Continue iron supplement.

## 2011-07-07 NOTE — Telephone Encounter (Signed)
Notified pt. 

## 2011-07-08 ENCOUNTER — Other Ambulatory Visit: Payer: Self-pay | Admitting: Family

## 2011-07-08 NOTE — Telephone Encounter (Addendum)
Spoke with pt.  Will review safety of continuing vitamin D with calcitriol with Dr. Sharl Ma her endocrinologist.  Message was left on Dr. Daune Perch voicemail.

## 2011-07-08 NOTE — Telephone Encounter (Signed)
Pt is concerned the once weekly Vit D may interfere with her endocrine issues (thyroid cancer tx).  Please advise.

## 2011-07-08 NOTE — Telephone Encounter (Signed)
Pt called back wanting me to verify that she should be on 50,000 units of Vit D weekly. States she is currently on calcitriol 0.5mg  3 capsules twice a day. Pt is concerned that the once weekly dosing may interfere with

## 2011-07-15 NOTE — Telephone Encounter (Signed)
Pls call pt and let her know that I have reviewed her medication with Dr. Sharl Ma and he is aware that she is on vitamin D.  Could you pls fax most recent labs to Dr. Sharl Ma (endo). Thanks

## 2011-07-15 NOTE — Telephone Encounter (Signed)
Pt.notified

## 2011-07-15 NOTE — Telephone Encounter (Signed)
Labs faxed to Dr Sharl Ma. @ [phone].

## 2011-07-20 ENCOUNTER — Other Ambulatory Visit: Payer: Self-pay | Admitting: Family

## 2011-07-21 ENCOUNTER — Telehealth: Payer: Self-pay | Admitting: Family

## 2011-07-21 NOTE — Telephone Encounter (Signed)
Refill previously sent via surescripts request.

## 2011-07-30 ENCOUNTER — Other Ambulatory Visit: Payer: Self-pay | Admitting: Family

## 2011-08-07 ENCOUNTER — Encounter: Payer: Self-pay | Admitting: Internal Medicine

## 2011-08-07 ENCOUNTER — Ambulatory Visit (INDEPENDENT_AMBULATORY_CARE_PROVIDER_SITE_OTHER): Payer: No Typology Code available for payment source | Admitting: Internal Medicine

## 2011-08-07 VITALS — BP 126/72 | HR 82 | Temp 98.3°F | Resp 16 | Wt 330.0 lb

## 2011-08-07 DIAGNOSIS — H6590 Unspecified nonsuppurative otitis media, unspecified ear: Secondary | ICD-10-CM

## 2011-08-07 MED ORDER — AZITHROMYCIN 250 MG PO TABS: ORAL_TABLET | ORAL | Status: AC

## 2011-08-09 NOTE — Progress Notes (Signed)
  Subjective:    Patient ID: [name], female    DOB: [birth year], 56 y.o.   MRN: [id number]  HPI Pt presents to clinic for evaluation of ear pain. Notes 6 day h/o right ear pain without drainage, fever, chills or injury. Has associated nasal congestion and ST. No alleviating or exacerbating factors. No other complaints.  Past Medical History  Diagnosis Date  . Allergic rhinitis   . Asthma   . Hypertension   . Hypothyroidism   . Anemia, iron deficiency   . Vitamin d deficiency   . Thyroid cancer   . Morbid obesity   . GERD (gastroesophageal reflux disease)   . Hypoparathyroidism 01/07/2011  . Arthritis   . Atrial fibrillation August 2012   Past Surgical History  Procedure Date  . Tonsillectomy and adenoidectomy   . Bunionectomy     bilateral  . Appendectomy   . Gastric bypass   . Thyroidectomy 05/15/2010    reports that she has quit smoking. She has never used smokeless tobacco. She reports that she drinks alcohol. She reports that she does not use illicit drugs. family history includes Asthma in her brother; Diabetes in her father; Heart attack in her father and mother; Heart disease in her mother; Hypertension in her father and mother; Hyperthyroidism in her mother; and Lung cancer in her father. Allergies  Allergen Reactions  . LOV:FIEPPIRJJOA+[id number]+SWFUXNATFT Acid+Aspartame   . Penicillins      Review of Systems see hpi     Objective:   Physical Exam  Constitutional: She appears well-developed and well-nourished. No distress.  HENT:  Head: Normocephalic and atraumatic.  Right Ear: External ear and ear canal normal. Tympanic membrane is injected. A middle ear effusion is present.  Left Ear: Tympanic membrane, external ear and ear canal normal.  Nose: Nose normal.  Mouth/Throat: Oropharynx is clear and moist. No oropharyngeal exudate.  Eyes: Conjunctivae are normal. Right eye exhibits no discharge. Left eye exhibits no discharge. No scleral icterus.    Neurological: She is alert.  Skin: Skin is warm and dry. She is not diaphoretic.  Psychiatric: She has a normal mood and affect.          Assessment & Plan:

## 2011-08-09 NOTE — Assessment & Plan Note (Signed)
Begin po abx. Followup if no improvement or worsening.  

## 2011-08-23 ENCOUNTER — Other Ambulatory Visit: Payer: Self-pay | Admitting: Family

## 2011-09-02 ENCOUNTER — Other Ambulatory Visit: Payer: Self-pay | Admitting: Internal Medicine

## 2011-09-02 NOTE — Telephone Encounter (Signed)
Ventolin request denied as refill was just sent to pharmacy on 08/19/11. Sent note to pharmacy to check refills on file.

## 2011-09-30 ENCOUNTER — Encounter: Payer: Self-pay | Admitting: Family

## 2011-09-30 ENCOUNTER — Ambulatory Visit (INDEPENDENT_AMBULATORY_CARE_PROVIDER_SITE_OTHER): Payer: No Typology Code available for payment source | Admitting: Family

## 2011-09-30 DIAGNOSIS — J309 Allergic rhinitis, unspecified: Secondary | ICD-10-CM

## 2011-09-30 DIAGNOSIS — M549 Dorsalgia, unspecified: Secondary | ICD-10-CM

## 2011-09-30 DIAGNOSIS — I4891 Unspecified atrial fibrillation: Secondary | ICD-10-CM

## 2011-09-30 DIAGNOSIS — E559 Vitamin D deficiency, unspecified: Secondary | ICD-10-CM

## 2011-09-30 DIAGNOSIS — J45909 Unspecified asthma, uncomplicated: Secondary | ICD-10-CM

## 2011-09-30 DIAGNOSIS — D509 Iron deficiency anemia, unspecified: Secondary | ICD-10-CM

## 2011-09-30 DIAGNOSIS — I10 Essential (primary) hypertension: Secondary | ICD-10-CM

## 2011-09-30 LAB — CBC WITH DIFFERENTIAL/PLATELET
Basophils Absolute: 0 10*3/uL (ref 0.0–0.1)
Eosinophils Relative: 2 % (ref 0–5)
Lymphocytes Relative: 17 % (ref 12–46)
Lymphs Abs: 1.7 10*3/uL (ref 0.7–4.0)
MCV: 80.2 fL (ref 78.0–100.0)
Neutro Abs: 7.5 10*3/uL (ref 1.7–7.7)
Platelets: 169 10*3/uL (ref 150–400)
RBC: 4.85 MIL/uL (ref 3.87–5.11)
WBC: 9.9 10*3/uL (ref 4.0–10.5)

## 2011-09-30 LAB — BASIC METABOLIC PANEL WITH GFR
BUN: 22 mg/dL (ref 6–23)
CO2: 30 meq/L (ref 19–32)
Calcium: 9.9 mg/dL (ref 8.4–10.5)
Chloride: 99 meq/L (ref 96–112)
Creat: 1.04 mg/dL (ref 0.50–1.10)
Glucose, Bld: 87 mg/dL (ref 70–99)
Potassium: 4.3 meq/L (ref 3.5–5.3)
Sodium: 140 meq/L (ref 135–145)

## 2011-09-30 LAB — FERRITIN: Ferritin: 121 ng/mL (ref 10–291)

## 2011-09-30 NOTE — Assessment & Plan Note (Signed)
She is seeing a Land.  Declines any further work up at this time.  Likely element of DDD.  Advised her against the use of aleve as she is on pradaxa.  OK to use tylenol PRN.  Recommended weight loss.  Consider PT referral if symptoms do not continue to improve.

## 2011-09-30 NOTE — Progress Notes (Signed)
Subjective:    Patient ID: [name], female    DOB: 20-Jan-1956, 56 y.o.   MRN: [id number]  HPI  Ms.  [name] is a 56 yr old female who presents today for follow up.  1) AF-  Per Dr. Jens Som, pt has been switched from aspirin 325 to pradaxa.  2) Asthma-  Pt reports that her asthma is well controlled.  She continues symbicort and is using albuterol prn.  3) Back pain- she has been seeing a chiropractor for this.  Reports that the pain originates in the mid-back between the shoulder blades.      Review of Systems See HPI  Past Medical History  Diagnosis Date  . Allergic rhinitis   . Asthma   . Hypertension   . Hypothyroidism   . Anemia, iron deficiency   . Vitamin d deficiency   . Thyroid cancer   . Morbid obesity   . GERD (gastroesophageal reflux disease)   . Hypoparathyroidism 01/07/2011  . Arthritis   . Atrial fibrillation August 2012    History   Social History  . Marital Status: Single    Spouse Name: N/A    Number of Children: 0  . Years of Education: N/A   Occupational History  . works in Medical laboratory scientific officer   . [employer]    Social History Main Topics  . Smoking status: Former Smoker -- 10 years  . Smokeless tobacco: Never Used  . Alcohol Use: Yes     Occasional - one to three per month  . Drug Use: No  . Sexually Active: Yes -- Female partner(s)   Other Topics Concern  . 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  She was light smoker for 10 years.           Past Surgical History  Procedure Date  . Tonsillectomy and adenoidectomy   . Bunionectomy     bilateral  . Appendectomy   . Gastric bypass   . Thyroidectomy 05/15/2010    Family History  Problem Relation Age of Onset  . Hypertension Father   . Diabetes Father   . Lung cancer Father   . Heart attack Father     MI at age 3  . Hypertension Mother   .  Hyperthyroidism Mother   . Heart disease Mother   . Heart attack Mother     MI at age 20  . Asthma Brother     Allergies  Allergen Reactions  . Amoxicillin-Pot Clavulanate   . Penicillins     Current Outpatient Prescriptions on File Prior to Visit  Medication Sig Dispense Refill  . albuterol (PROVENTIL) (2.5 MG/3ML) 0.083% nebulizer solution 1 vial 4 times a day as needed       . albuterol (VENTOLIN HFA) 108 (90 BASE) MCG/ACT inhaler Inhale 2 puffs into the lungs every 6 (six) hours as needed for wheezing or shortness of breath.  1 Inhaler  3  . Ascorbic Acid (VITAMIN C CR) 1000 MG TBCR Take 1 twice a week      . calcitRIOL (ROCALTROL) 0.5 MCG capsule 3 tablets twice a day       . calcium elemental as carbonate (BARIATRIC TUMS ULTRA) 400 MG tablet Chew 4,000 mg by mouth 3 (three) times daily.       . cetirizine (ZYRTEC) 10 MG tablet Take 10 mg by  mouth daily.      . Cyanocobalamin (VITAMIN B 12 PO) Take 1 tablet by mouth every 3 (three) days. 500 mcg once a week       . dabigatran (PRADAXA) 150 MG CAPS Take 1 capsule (150 mg total) by mouth every 12 (twelve) hours.  60 capsule  6  . diltiazem (CARDIZEM CD) 240 MG 24 hr capsule Take 1 capsule (240 mg total) by mouth daily.  30 capsule  11  . DIOVAN 80 MG tablet TAKE ONE TABLET BY MOUTH EVERY DAY  30 each  3  . ergocalciferol (VITAMIN D2) 50000 UNITS capsule Take 1 capsule (50,000 Units total) by mouth once a week.  4 capsule  11  . fluticasone (FLONASE) 50 MCG/ACT nasal spray Place 2 sprays into the nose daily.  1 g  2  . furosemide (LASIX) 40 MG tablet TAKE ONE TABLET BY MOUTH EVERY DAY  90 tablet  1  . glucosamine-chondroitin 500-400 MG tablet Take 1 tablet by mouth daily.        . hydrochlorothiazide (HYDRODIURIL) 25 MG tablet TAKE ONE TABLET BY MOUTH EVERY DAY  30 tablet  3  . iron polysaccharides (NU-IRON) 150 MG capsule Take 150 mg by mouth every 3 (three) days.       [name] levothyroxine (SYNTHROID, LEVOTHROID) 137 MCG tablet Take 3  pills once a day.      Maxwell Caul Bicarbonate (ZEGERID) 20-1100 MG CAPS Take 1 capsule by mouth daily before breakfast.        . SYMBICORT 160-4.5 MCG/ACT inhaler INHALE TWO PUFFS BY MOUTH TWICE DAILY  11 g  2  . zafirlukast (ACCOLATE) 20 MG tablet TAKE ONE TABLET BY MOUTH TWICE DAILY  60 tablet  2    BP 100/70  Pulse 78  Temp(Src) 98.4 F (36.9 C) (Oral)  Resp 16  Ht 5\' 4"  (1.626 m)  Wt 327 lb 1.3 oz (148.363 kg)  BMI 56.14 kg/m2  SpO2 99%  LMP 05/26/2009       Objective:   Physical Exam  Constitutional: She appears well-developed and well-nourished. No distress.  HENT:  Head: Normocephalic and atraumatic.  Right Ear: Tympanic membrane and ear canal normal.  Left Ear: Tympanic membrane and ear canal normal.  Mouth/Throat: No oropharyngeal exudate or posterior oropharyngeal edema.  Cardiovascular: Normal rate and regular rhythm.   Pulmonary/Chest: Effort normal and breath sounds normal. No respiratory distress. She has no wheezes. She has no rales. She exhibits no tenderness.  Musculoskeletal: She exhibits no edema.  Psychiatric: She has a normal mood and affect. Her behavior is normal. Judgment and thought content normal.          Assessment & Plan:

## 2011-09-30 NOTE — Assessment & Plan Note (Signed)
This remains stable on symbicort, continue same.

## 2011-09-30 NOTE — Assessment & Plan Note (Signed)
Obtain follow up vitamin D level. 

## 2011-09-30 NOTE — Assessment & Plan Note (Signed)
BP Readings from Last 3 Encounters:  09/30/11 100/70  08/07/11 126/72  07/02/11 130/84  BP looks good. On low side, but asymptomatic. She is maintained on cardizem (for AF per cardiology) and diovan.

## 2011-09-30 NOTE — Patient Instructions (Signed)
Please complete lab work prior to leaving. Follow up in 3 months.  

## 2011-09-30 NOTE — Assessment & Plan Note (Signed)
Obtain iron and CBC.

## 2011-09-30 NOTE — Assessment & Plan Note (Signed)
Rate is stable. Defer management to cardiology. She is on pradaxa.

## 2011-09-30 NOTE — Assessment & Plan Note (Signed)
Recommended zyrtec instead of claritin.  Continue flonase, trial of neti pot.

## 2011-10-01 ENCOUNTER — Telehealth: Payer: Self-pay | Admitting: Family

## 2011-10-01 ENCOUNTER — Encounter: Payer: Self-pay | Admitting: Family

## 2011-10-03 ENCOUNTER — Other Ambulatory Visit: Payer: Self-pay | Admitting: Internal Medicine

## 2011-11-05 ENCOUNTER — Telehealth: Payer: Self-pay | Admitting: Family

## 2011-11-05 MED ORDER — HYDROCHLOROTHIAZIDE 25 MG PO TABS: 25.0000 mg | ORAL_TABLET | Freq: Every day | ORAL | Status: DC

## 2011-11-05 NOTE — Telephone Encounter (Signed)
Refill- hydrochlorothiazide 25mg  tab. Take one tablet by mouth every day. Qty 30 last fill 5.14.13

## 2011-11-05 NOTE — Telephone Encounter (Signed)
Refill sent to pharmacy #30 x 2 refills. 

## 2011-11-12 ENCOUNTER — Encounter: Payer: Self-pay | Admitting: Cardiology

## 2011-11-12 ENCOUNTER — Ambulatory Visit (INDEPENDENT_AMBULATORY_CARE_PROVIDER_SITE_OTHER): Payer: No Typology Code available for payment source | Admitting: Cardiology

## 2011-11-12 VITALS — BP 148/80 | HR 87 | Ht 64.0 in | Wt 337.0 lb

## 2011-11-12 DIAGNOSIS — R9430 Abnormal result of cardiovascular function study, unspecified: Secondary | ICD-10-CM

## 2011-11-12 DIAGNOSIS — I4891 Unspecified atrial fibrillation: Secondary | ICD-10-CM

## 2011-11-12 DIAGNOSIS — I10 Essential (primary) hypertension: Secondary | ICD-10-CM

## 2011-11-12 MED ORDER — RIVAROXABAN 20 MG PO TABS: 20.0000 mg | ORAL_TABLET | Freq: Every day | ORAL | Status: DC

## 2011-11-12 NOTE — Progress Notes (Signed)
HPI: Pleasant female for fu of atrial fibrillation. I initially saw her in July of 2012 for evaluation of chest pain. Echocardiogram in January of 2012 showed normal LV function, mild left ventricular hypertrophy and mild left atrial enlargement. CTA showed no pulmonary embolus in July 2012. Patient had a Myoview in July of 2012. Her ejection fraction is 64%. Patient had mild ischemia in the inferior wall. We elected to treat medically. Patient was also having palpitations and near syncopal episodes. A CardioNet revealed sinus rhythm with PACs, brief runs of PAT and brief runs of PAF. I last saw her in Sept 2012. Since then, she denies dyspnea on exertion, orthopnea or PND. She has chronic pedal edema. She states she awakes occasionally with a sharp pain in her chest that lasts 1-2 seconds. She otherwise does not have exertional chest pain. She has had brief palpitations lasting less than 1 minute but nothing sustained.   Current Outpatient Prescriptions  Medication Sig Dispense Refill  . albuterol (PROVENTIL) (2.5 MG/3ML) 0.083% nebulizer solution 1 vial 4 times a day as needed       . Ascorbic Acid (VITAMIN C CR) 1000 MG TBCR Take 1 twice a week      . calcitRIOL (ROCALTROL) 0.5 MCG capsule 3 tablets twice a day       . calcium elemental as carbonate (BARIATRIC TUMS ULTRA) 400 MG tablet Chew 4,000 mg by mouth 3 (three) times daily.       . cetirizine (ZYRTEC) 10 MG tablet Take 10 mg by mouth daily.      . Cyanocobalamin (VITAMIN B 12 PO) Take 1 tablet by mouth every 3 (three) days. 500 mcg once a week       . dabigatran (PRADAXA) 150 MG CAPS Take 1 capsule (150 mg total) by mouth every 12 (twelve) hours.  60 capsule  6  . diltiazem (CARDIZEM CD) 240 MG 24 hr capsule Take 1 capsule (240 mg total) by mouth daily.  30 capsule  11  . DIOVAN 80 MG tablet TAKE ONE TABLET BY MOUTH EVERY DAY  30 each  3  . ergocalciferol (VITAMIN D2) 50000 UNITS capsule Take 1 capsule (50,000 Units total) by mouth once  a week.  4 capsule  11  . fluticasone (FLONASE) 50 MCG/ACT nasal spray Place 2 sprays into the nose daily.  1 g  2  . furosemide (LASIX) 40 MG tablet TAKE ONE TABLET BY MOUTH EVERY DAY  90 tablet  1  . glucosamine-chondroitin 500-400 MG tablet Take 1 tablet by mouth daily.        . hydrochlorothiazide (HYDRODIURIL) 25 MG tablet Take 1 tablet (25 mg total) by mouth daily.  30 tablet  2  . iron polysaccharides (NU-IRON) 150 MG capsule Take 150 mg by mouth every 3 (three) days.       [name] levothyroxine (SYNTHROID, LEVOTHROID) 137 MCG tablet Take 3 pills once a day.      Maxwell Caul Bicarbonate (ZEGERID) 20-1100 MG CAPS Take 1 capsule by mouth daily before breakfast.        . SYMBICORT 160-4.5 MCG/ACT inhaler INHALE TWO PUFFS BY MOUTH TWICE DAILY  11 g  2  . VENTOLIN HFA 108 (90 BASE) MCG/ACT inhaler INHALE TWO PUFFS EVERY 6 HOURS AS NEEDED FOR WHEEZING OR SHORTNESS OF BREATH  18 g  2  . zafirlukast (ACCOLATE) 20 MG tablet TAKE ONE TABLET BY MOUTH TWICE DAILY  60 tablet  2     Past Medical History  Diagnosis  Date  . Allergic rhinitis   . Asthma   . Hypertension   . Hypothyroidism   . Anemia, iron deficiency   . Vitamin d deficiency   . Thyroid cancer   . Morbid obesity   . GERD (gastroesophageal reflux disease)   . Hypoparathyroidism 01/07/2011  . Arthritis   . Atrial fibrillation August 2012    Past Surgical History  Procedure Date  . Tonsillectomy and adenoidectomy   . Bunionectomy     bilateral  . Appendectomy   . Gastric bypass   . Thyroidectomy 05/15/2010    History   Social History  . Marital Status: Single    Spouse Name: N/A    Number of Children: 0  . Years of Education: N/A   Occupational History  . works in Medical laboratory scientific officer   . [employer]    Social History Main Topics  . Smoking status: Former Smoker -- 10 years  . Smokeless tobacco: Never Used  . Alcohol Use: Yes     Occasional - one to three per month  . Drug Use: No  . Sexually  Active: Yes -- Female partner(s)   Other Topics Concern  . 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  She was light smoker for 10 years.           ROS: recent asthma and lower extremity edema but no fevers or chills, productive cough, hemoptysis, dysphasia, odynophagia, melena, hematochezia, dysuria, hematuria, rash, seizure activity, orthopnea, PND, claudication. Remaining systems are negative.  Physical Exam: Well-developed obese in no acute distress.  Skin is warm and dry.  HEENT is normal.  Neck is supple.   Chest is clear to auscultation with normal expansion.  Cardiovascular exam is regular rate and rhythm.  Abdominal exam nontender or distended. No masses palpated. Extremities show trace edema. neuro grossly intact  ECG sinus rhythm at a rate of 87. Occasional PACs. No significant ST changes.

## 2011-11-12 NOTE — Assessment & Plan Note (Signed)
Patient remains in sinus rhythm. Continue Cardizem. Her insurance will not cover pradaxa. We will therefore discontinue after her present supply expires. 3 days later we will begin xeralto 20 mg by mouth daily.

## 2011-11-12 NOTE — Assessment & Plan Note (Signed)
Continue present blood pressure medications. 

## 2011-11-12 NOTE — Patient Instructions (Addendum)
Your physician wants you to follow-up in: ONE YEAR WITH DR Jens Som IN [place] You will receive a reminder letter in the mail two months in advance. If you don't receive a letter, please call our office to schedule the follow-up appointment.   STOP PRADAXA WHEN PRESENT BOTTLE FINISHED  TAKE NOTHING FOR THREE DAYS THEN START XARELTO 20 MG ONCE DAILY

## 2011-11-12 NOTE — Assessment & Plan Note (Signed)
Present chest pain is not consistent with cardiac pain. Previous Myoview low risk. We will most likely repeat that in the future.

## 2011-11-26 ENCOUNTER — Other Ambulatory Visit: Payer: Self-pay | Admitting: Internal Medicine

## 2011-11-26 MED ORDER — ALBUTEROL SULFATE (2.5 MG/3ML) 0.083% IN NEBU: INHALATION_SOLUTION | RESPIRATORY_TRACT | Status: DC

## 2011-12-21 ENCOUNTER — Other Ambulatory Visit: Payer: Self-pay | Admitting: Family

## 2011-12-31 ENCOUNTER — Other Ambulatory Visit: Payer: Self-pay | Admitting: Family

## 2011-12-31 ENCOUNTER — Ambulatory Visit (INDEPENDENT_AMBULATORY_CARE_PROVIDER_SITE_OTHER): Payer: No Typology Code available for payment source | Admitting: Family

## 2011-12-31 ENCOUNTER — Encounter: Payer: Self-pay | Admitting: Family

## 2011-12-31 VITALS — BP 130/78 | HR 84 | Temp 98.1°F | Resp 18 | Ht 64.0 in | Wt 331.0 lb

## 2011-12-31 DIAGNOSIS — R319 Hematuria, unspecified: Secondary | ICD-10-CM

## 2011-12-31 DIAGNOSIS — R310 Gross hematuria: Secondary | ICD-10-CM

## 2011-12-31 DIAGNOSIS — M545 Low back pain, unspecified: Secondary | ICD-10-CM

## 2011-12-31 DIAGNOSIS — E039 Hypothyroidism, unspecified: Secondary | ICD-10-CM

## 2011-12-31 LAB — POCT URINALYSIS DIPSTICK
Bilirubin, UA: NEGATIVE
Glucose, UA: NEGATIVE
Ketones, UA: NEGATIVE
Spec Grav, UA: 1.01
Urobilinogen, UA: 0.2

## 2011-12-31 LAB — CBC WITH DIFFERENTIAL/PLATELET
Basophils Absolute: 0.1 10*3/uL (ref 0.0–0.1)
Basophils Relative: 1 % (ref 0–1)
Hemoglobin: 11.9 g/dL — ABNORMAL LOW (ref 12.0–15.0)
Lymphocytes Relative: 18 % (ref 12–46)
MCHC: 32.5 g/dL (ref 30.0–36.0)
Monocytes Relative: 5 % (ref 3–12)
Neutro Abs: 7.7 10*3/uL (ref 1.7–7.7)
Neutrophils Relative %: 74 % (ref 43–77)
WBC: 10.3 10*3/uL (ref 4.0–10.5)

## 2011-12-31 LAB — TSH: TSH: 0.477 u[IU]/mL (ref 0.350–4.500)

## 2011-12-31 MED ORDER — CIPROFLOXACIN HCL 500 MG PO TABS: 500.0000 mg | ORAL_TABLET | Freq: Two times a day (BID) | ORAL | Status: DC

## 2011-12-31 MED ORDER — TRAMADOL HCL 50 MG PO TABS: 50.0000 mg | ORAL_TABLET | Freq: Four times a day (QID) | ORAL | Status: AC | PRN

## 2011-12-31 NOTE — Patient Instructions (Signed)
Complete blood work prior to leaving. Hold pradaxa for 3 days.  May resume if blood in urine has resolved- call us if no improvement in blood in urine. Go to ER if you develop worsening weakness/worsening bloody urine. Follow up in 1 week.

## 2011-12-31 NOTE — Progress Notes (Signed)
Subjective:    Patient ID: [name], female    DOB: February 13, 1956, 56 y.o.   MRN: [id number]  HPI  [name] is a 56 yr old female who presents today for follow up. She also has several concerns.  1) Hematuria- Started yesterday- cherry.  Reports + bladder pressure, + chronic frequency- at baseline. Reports that her back pain is at baseline.    2) Back Pain/ankle pain- using tylenol.  Occasionally tylenol "doesn't cut it" and she needs to use hydrocodone. She tells me she has only used 10x this year.   3) Fatigue-This has been present for 1 month.   4) Hypoparathyroidism/hypothyroid- this continues to be managed by Dr. Sharl Ma- endocrinology.   Review of Systems See HPI  Past Medical History  Diagnosis Date  . Allergic rhinitis   . Asthma   . Hypertension   . Hypothyroidism   . Anemia, iron deficiency   . Vitamin d deficiency   . Thyroid cancer   . Morbid obesity   . GERD (gastroesophageal reflux disease)   . Hypoparathyroidism 01/07/2011  . Arthritis   . Atrial fibrillation August 2012    History   Social History  . Marital Status: Single    Spouse Name: N/A    Number of Children: 0  . Years of Education: N/A   Occupational History  . works in Medical laboratory scientific officer   . [employer]    Social History Main Topics  . Smoking status: Former Smoker -- 10 years  . Smokeless tobacco: Never Used  . Alcohol Use: Yes     Occasional - one to three per month  . Drug Use: No  . Sexually Active: Yes -- Female partner(s)   Other Topics Concern  . 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  She was light smoker for 10 years.           Past Surgical History  Procedure Date  . Tonsillectomy and adenoidectomy   . Bunionectomy     bilateral  . Appendectomy   . Gastric bypass   . Thyroidectomy 05/15/2010    Family History  Problem Relation Age of  Onset  . Hypertension Father   . Diabetes Father   . Lung cancer Father   . Heart attack Father     MI at age 34  . Hypertension Mother   . Hyperthyroidism Mother   . Heart disease Mother   . Heart attack Mother     MI at age 32  . Asthma Brother     Allergies  Allergen Reactions  . Amoxicillin-Pot Clavulanate   . Penicillins     Current Outpatient Prescriptions on File Prior to Visit  Medication Sig Dispense Refill  . albuterol (PROVENTIL) (2.5 MG/3ML) 0.083% nebulizer solution 1 vial 4 times a day as needed  75 mL  1  . Ascorbic Acid (VITAMIN C CR) 1000 MG TBCR Take 1 twice a week      . calcitRIOL (ROCALTROL) 0.5 MCG capsule 3 tablets twice a day       . calcium elemental as carbonate (BARIATRIC TUMS ULTRA) 400 MG tablet Chew 4,000 mg by mouth 3 (three) times daily.       . cetirizine (ZYRTEC) 10 MG tablet Take 10 mg by mouth daily.      . Cyanocobalamin (VITAMIN B 12 PO) Take 1  tablet by mouth every 3 (three) days. 500 mcg once a week       . diltiazem (CARDIZEM CD) 240 MG 24 hr capsule Take 1 capsule (240 mg total) by mouth daily.  30 capsule  11  . DIOVAN 80 MG tablet TAKE ONE TABLET BY MOUTH EVERY DAY  30 each  0  . ergocalciferol (VITAMIN D2) 50000 UNITS capsule Take 1 capsule (50,000 Units total) by mouth once a week.  4 capsule  11  . fluticasone (FLONASE) 50 MCG/ACT nasal spray Place 2 sprays into the nose daily.  1 g  2  . furosemide (LASIX) 40 MG tablet TAKE ONE TABLET BY MOUTH EVERY DAY  90 tablet  1  . glucosamine-chondroitin 500-400 MG tablet Take 1 tablet by mouth daily.        . hydrochlorothiazide (HYDRODIURIL) 25 MG tablet Take 1 tablet (25 mg total) by mouth daily.  30 tablet  2  . iron polysaccharides (NU-IRON) 150 MG capsule Take 150 mg by mouth every 3 (three) days.       [name] levothyroxine (SYNTHROID, LEVOTHROID) 137 MCG tablet Take 3 pills once a day.      Maxwell Caul Bicarbonate (ZEGERID) 20-1100 MG CAPS Take 1 capsule by mouth daily before  breakfast.        . Rivaroxaban (XARELTO) 20 MG TABS Take 20 mg by mouth daily.  30 tablet  12  . SYMBICORT 160-4.5 MCG/ACT inhaler INHALE TWO PUFFS BY MOUTH TWICE DAILY  11 g  2  . VENTOLIN HFA 108 (90 BASE) MCG/ACT inhaler INHALE TWO PUFFS EVERY 6 HOURS AS NEEDED FOR WHEEZING OR SHORTNESS OF BREATH  18 g  2  . zafirlukast (ACCOLATE) 20 MG tablet TAKE ONE TABLET BY MOUTH TWICE DAILY  60 tablet  0    BP 130/78  Pulse 84  Temp 98.1 F (36.7 C) (Oral)  Resp 18  Ht 5\' 4"  (1.626 m)  Wt 331 lb (150.141 kg)  BMI 56.82 kg/m2  SpO2 97%  LMP 05/26/2009       Objective:   Physical Exam  Constitutional: She is oriented to person, place, and time. She appears well-developed and well-nourished. No distress.  HENT:  Head: Normocephalic and atraumatic.  Cardiovascular: Normal rate and regular rhythm.   No murmur heard. Pulmonary/Chest: Effort normal and breath sounds normal. No respiratory distress. She has no wheezes. She has no rales. She exhibits no tenderness.  Musculoskeletal: She exhibits no edema.  Neurological: She is alert and oriented to person, place, and time.  Skin: Skin is warm and dry. No rash noted. No erythema. No pallor.  Psychiatric: She has a normal mood and affect. Her behavior is normal. Judgment and thought content normal.          Assessment & Plan:

## 2011-12-31 NOTE — Assessment & Plan Note (Signed)
UA reviewed and suggestive of UTI.  Will rx with cipro.  Will obtain CBC (stat) and hold pradaxa for at least 3 days.  Discussed with Dr. Jens Som and he is OK with holding until hematuria is resolved. Pt is advised to go to ED as outlined in AVS.

## 2011-12-31 NOTE — Assessment & Plan Note (Signed)
Will rx with tramadol.  Pt instructed to used tylenol first and tramadol sparingly.

## 2011-12-31 NOTE — Assessment & Plan Note (Signed)
Obtain TSH in setting of fatigue.

## 2012-01-02 ENCOUNTER — Telehealth: Payer: Self-pay | Admitting: Family

## 2012-01-02 DIAGNOSIS — R319 Hematuria, unspecified: Secondary | ICD-10-CM

## 2012-01-02 LAB — URINE CULTURE
Colony Count: NO GROWTH
Organism ID, Bacteria: NO GROWTH

## 2012-01-02 NOTE — Telephone Encounter (Signed)
Spoke to pt.  She reports resolution of hematuria.  Reviewed neg urine culture.  Instructed pt to stop abx. Resume xarelto on Sunday night. Follow up next week as scheduled.  Refer to Urology.  Pt verbalizes understanding.

## 2012-01-05 ENCOUNTER — Telehealth: Payer: Self-pay | Admitting: *Deleted

## 2012-01-05 ENCOUNTER — Encounter: Payer: Self-pay | Admitting: Family

## 2012-01-05 NOTE — Telephone Encounter (Signed)
Attempted notify pt by phone and left detailed message on cell voicemail as well.   From: Kathi Simpers, CMA   Created: 01/05/2012 4:05 PM    I spoke to [facility], she would like you to stop Xarelto until further notice. If you pass clots or are noticing bright red (cherry red) blood then she wants you to go to the ER for further evaluation. She is moving your urology consult up earlier to this Wednesday at 12:30pm with Dr McDermot at [facility] (same group as Dr Lindley Magnus). She would like you to keep your follow up with her on 01/07/12 as well. Let me know if you have any further questions.    ----- Message ----- From: Iona Beard Sent: 01/05/2012 12:47 PM EDT To: Lemont Fillers., NP Subject: Non-Urgent Medical Question  I re-started Xarelto Sunday morning and I'm passing blood again. Not real red. Advice?

## 2012-01-07 ENCOUNTER — Encounter: Payer: Self-pay | Admitting: Family

## 2012-01-07 ENCOUNTER — Ambulatory Visit (INDEPENDENT_AMBULATORY_CARE_PROVIDER_SITE_OTHER): Payer: No Typology Code available for payment source | Admitting: Family

## 2012-01-07 ENCOUNTER — Ambulatory Visit (HOSPITAL_BASED_OUTPATIENT_CLINIC_OR_DEPARTMENT_OTHER)
Admission: RE | Admit: 2012-01-07 | Discharge: 2012-01-07 | Disposition: A | Discharge status: Home / Self Care | Payer: No Typology Code available for payment source | Source: Ambulatory Visit | Attending: Family | Admitting: Family

## 2012-01-07 ENCOUNTER — Telehealth: Payer: Self-pay | Admitting: Family

## 2012-01-07 VITALS — BP 126/70 | HR 76 | Temp 98.0°F | Resp 16 | Ht 64.0 in | Wt 329.0 lb

## 2012-01-07 DIAGNOSIS — K760 Fatty (change of) liver, not elsewhere classified: Secondary | ICD-10-CM

## 2012-01-07 DIAGNOSIS — N200 Calculus of kidney: Secondary | ICD-10-CM | POA: Insufficient documentation

## 2012-01-07 DIAGNOSIS — K7689 Other specified diseases of liver: Secondary | ICD-10-CM | POA: Insufficient documentation

## 2012-01-07 DIAGNOSIS — N201 Calculus of ureter: Secondary | ICD-10-CM | POA: Insufficient documentation

## 2012-01-07 DIAGNOSIS — R319 Hematuria, unspecified: Secondary | ICD-10-CM | POA: Insufficient documentation

## 2012-01-07 HISTORY — DX: Fatty (change of) liver, not elsewhere classified: K76.0

## 2012-01-07 LAB — CBC WITH DIFFERENTIAL/PLATELET
Basophils Absolute: 0 10*3/uL (ref 0.0–0.1)
HCT: 36.5 % (ref 36.0–46.0)
Lymphocytes Relative: 18 % (ref 12–46)
Neutro Abs: 7.3 10*3/uL (ref 1.7–7.7)
Platelets: 174 10*3/uL (ref 150–400)
RBC: 4.75 MIL/uL (ref 3.87–5.11)
RDW: 17.7 % — ABNORMAL HIGH (ref 11.5–15.5)
WBC: 9.8 10*3/uL (ref 4.0–10.5)

## 2012-01-07 NOTE — Patient Instructions (Addendum)
Please complete your CT on the first floor. Keep upcoming apt with Urology today. Continue to hold xarelto for now.

## 2012-01-07 NOTE — Assessment & Plan Note (Addendum)
Unchanged.  CT abd/pelvis obtained and notes bilateral non-obstructive kidney stones.  Obtain CBC today as well.Pt to see Urology today.  Continue to hold xarelto for now.

## 2012-01-07 NOTE — Progress Notes (Signed)
Subjective:    Patient ID: [name], female    DOB: 07-13-1955, 56 y.o.   MRN: [id number]  HPI  Ms.  Rarick is a 56 yr old female who presents today for follow up of her hematuria.  She held xarelto for 3 days.  Hematuria resolved.  She then resumed xarelto and the following day her hematuria returned within a few hours.  Urine culture was negative. Reports bilateral low back pain. She has appointment at 12:30 today with urology. She denies associated fever.    Review of Systems See HPI  Past Medical History  Diagnosis Date  . Allergic rhinitis   . Asthma   . Hypertension   . Hypothyroidism   . Anemia, iron deficiency   . Vitamin d deficiency   . Thyroid cancer   . Morbid obesity   . GERD (gastroesophageal reflux disease)   . Hypoparathyroidism 01/07/2011  . Arthritis   . Atrial fibrillation August 2012    History   Social History  . Marital Status: Single    Spouse Name: N/A    Number of Children: 0  . Years of Education: N/A   Occupational History  . works in Medical laboratory scientific officer   . [employer]    Social History Main Topics  . Smoking status: Former Smoker -- 10 years  . Smokeless tobacco: Never Used  . Alcohol Use: Yes     Occasional - one to three per month  . Drug Use: No  . Sexually Active: Yes -- Female partner(s)   Other Topics Concern  . 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  She was light smoker for 10 years.           Past Surgical History  Procedure Date  . Tonsillectomy and adenoidectomy   . Bunionectomy     bilateral  . Appendectomy   . Gastric bypass   . Thyroidectomy 05/15/2010    Family History  Problem Relation Age of Onset  . Hypertension Father   . Diabetes Father   . Lung cancer Father   . Heart attack Father     MI at age 69  . Hypertension Mother   . Hyperthyroidism Mother   . Heart disease  Mother   . Heart attack Mother     MI at age 51  . Asthma Brother     Allergies  Allergen Reactions  . Amoxicillin-Pot Clavulanate   . Penicillins     Current Outpatient Prescriptions on File Prior to Visit  Medication Sig Dispense Refill  . albuterol (PROVENTIL) (2.5 MG/3ML) 0.083% nebulizer solution 1 vial 4 times a day as needed  75 mL  1  . Ascorbic Acid (VITAMIN C CR) 1000 MG TBCR Take 1 twice a week      . calcitRIOL (ROCALTROL) 0.5 MCG capsule 3 tablets twice a day       . calcium elemental as carbonate (BARIATRIC TUMS ULTRA) 400 MG tablet Chew 4,000 mg by mouth 3 (three) times daily.       . cetirizine (ZYRTEC) 10 MG tablet Take 10 mg by mouth daily.      . Cyanocobalamin (VITAMIN B 12 PO) Take 1 tablet by mouth every 3 (three) days. 500 mcg once a week       . diltiazem (CARDIZEM CD) 240 MG 24 hr capsule Take 1 capsule (240  mg total) by mouth daily.  30 capsule  11  . DIOVAN 80 MG tablet TAKE ONE TABLET BY MOUTH EVERY DAY  30 each  0  . ergocalciferol (VITAMIN D2) 50000 UNITS capsule Take 1 capsule (50,000 Units total) by mouth once a week.  4 capsule  11  . fluticasone (FLONASE) 50 MCG/ACT nasal spray USE TWO SPRAYS IN EACH NOSTRIL EVERY DAY  16 g  2  . furosemide (LASIX) 40 MG tablet TAKE ONE TABLET BY MOUTH EVERY DAY  90 tablet  1  . glucosamine-chondroitin 500-400 MG tablet Take 1 tablet by mouth daily.        . hydrochlorothiazide (HYDRODIURIL) 25 MG tablet Take 1 tablet (25 mg total) by mouth daily.  30 tablet  2  . iron polysaccharides (NU-IRON) 150 MG capsule Take 150 mg by mouth every 3 (three) days.       [name] levothyroxine (SYNTHROID, LEVOTHROID) 137 MCG tablet Take 3 pills once a day.      Maxwell Caul Bicarbonate (ZEGERID) 20-1100 MG CAPS Take 1 capsule by mouth daily before breakfast.        . SYMBICORT 160-4.5 MCG/ACT inhaler INHALE TWO PUFFS BY MOUTH TWICE DAILY  11 g  2  . traMADol (ULTRAM) 50 MG tablet Take 1 tablet (50 mg total) by mouth every 6 (six)  hours as needed for pain.  30 tablet  0  . VENTOLIN HFA 108 (90 BASE) MCG/ACT inhaler INHALE TWO PUFFS EVERY 6 HOURS AS NEEDED FOR WHEEZING OR SHORTNESS OF BREATH  18 g  2  . zafirlukast (ACCOLATE) 20 MG tablet TAKE ONE TABLET BY MOUTH TWICE DAILY  60 tablet  0  . ciprofloxacin (CIPRO) 500 MG tablet Take 1 tablet (500 mg total) by mouth 2 (two) times daily.  10 tablet  0  . Rivaroxaban (XARELTO) 20 MG TABS Take 20 mg by mouth daily.  30 tablet  12    BP 126/70  Pulse 76  Temp 98 F (36.7 C) (Oral)  Resp 16  Ht 5\' 4"  (1.626 m)  Wt 329 lb (149.233 kg)  BMI 56.47 kg/m2  SpO2 98%  LMP 05/26/2009       Objective:   Physical Exam  Constitutional: She appears well-developed and well-nourished. No distress.  HENT:  Head: Normocephalic and atraumatic.  Eyes: No scleral icterus.  Cardiovascular: Normal rate and regular rhythm.   No murmur heard. Pulmonary/Chest: Effort normal and breath sounds normal. No respiratory distress. She has no wheezes. She has no rales. She exhibits no tenderness.  Skin: Skin is warm and dry.  Psychiatric: She has a normal mood and affect. Her behavior is normal. Judgment and thought content normal.          Assessment & Plan:

## 2012-01-07 NOTE — Telephone Encounter (Signed)
See result note for CT. 

## 2012-01-08 ENCOUNTER — Telehealth: Payer: Self-pay | Admitting: Family

## 2012-01-10 ENCOUNTER — Other Ambulatory Visit: Payer: Self-pay | Admitting: Family

## 2012-01-23 ENCOUNTER — Telehealth: Payer: Self-pay | Admitting: Family

## 2012-01-23 NOTE — Telephone Encounter (Signed)
Called pt to follow up on progress with urology.  Left message requesting that she return my call.

## 2012-01-28 NOTE — Telephone Encounter (Signed)
Left another message for pt to call me back to discuss her plans with urology and xarelto. Requested that she call me back Friday AM when I am back in the office.

## 2012-02-03 ENCOUNTER — Encounter: Payer: Self-pay | Admitting: Family

## 2012-02-03 NOTE — Telephone Encounter (Signed)
Nicki Guadalajara, could you please request most recent office note from [facility]?

## 2012-02-05 ENCOUNTER — Other Ambulatory Visit: Payer: Self-pay | Admitting: Family

## 2012-02-25 ENCOUNTER — Telehealth: Payer: Self-pay | Admitting: Family

## 2012-02-25 NOTE — Telephone Encounter (Signed)
Spoke with pt. She tells me that she has had recurrent bleeding when she has tried to restart xarelto an has had to stop.  Last visit with Urology was 9/1 per pt.  I have asked her to contact urology and let them know about her recurrent hematuria and to arrange follow up with their office. She verbalizes understanding and will let me know if she has trouble getting back in to see them.

## 2012-02-27 ENCOUNTER — Other Ambulatory Visit: Payer: Self-pay | Admitting: Urology

## 2012-03-01 ENCOUNTER — Telehealth: Payer: Self-pay | Admitting: Family

## 2012-03-01 NOTE — Patient Instructions (Signed)
21 [name]  03/01/2012   Your procedure is scheduled on:  03/03/12 125pm-225pm  Report to [facility] at 1100 AM.  Call this number if you have problems the morning of surgery: [phone]   Remember:   Do not eat food:After Midnight.  May have clear liquids until 0700am then npo   Clear liquids include soda, tea, black coffee, apple or grape juice, broth.  Take these medicines the morning of surgery with A SIP OF WATER:    Do not wear jewelry, make-up or nail polish.  Do not wear lotions, powders, or perfumes.   Do not shave 48 hours prior to surgery.   Do not bring valuables to the hospital.  Contacts, dentures or bridgework may not be worn into surgery.     Patients discharged the day of surgery will not be allowed to drive home.  Name and phone number of your driver:   SEE CHG INSTRUCTION SHEET    Please read over the following fact sheets that you were given: MRSA Information, coughing and deep breathing exercises, leg exercises

## 2012-03-02 ENCOUNTER — Encounter (HOSPITAL_COMMUNITY)
Admission: RE | Admit: 2012-03-02 | Discharge: 2012-03-02 | Disposition: A | Discharge status: Home / Self Care | Payer: No Typology Code available for payment source | Source: Ambulatory Visit | Attending: Urology | Admitting: Urology

## 2012-03-02 ENCOUNTER — Encounter (HOSPITAL_COMMUNITY): Payer: Self-pay

## 2012-03-02 ENCOUNTER — Telehealth: Payer: Self-pay | Admitting: Family

## 2012-03-02 ENCOUNTER — Encounter: Payer: Self-pay | Admitting: Family

## 2012-03-02 HISTORY — DX: Headache: R51

## 2012-03-02 LAB — SURGICAL PCR SCREEN
MRSA, PCR: NEGATIVE
Staphylococcus aureus: POSITIVE — AB

## 2012-03-02 LAB — CBC
MCV: 76.8 fL — ABNORMAL LOW (ref 78.0–100.0)
Platelets: 147 10*3/uL — ABNORMAL LOW (ref 150–400)
RBC: 4.78 MIL/uL (ref 3.87–5.11)
RDW: 17.3 % — ABNORMAL HIGH (ref 11.5–15.5)
WBC: 10.9 10*3/uL — ABNORMAL HIGH (ref 4.0–10.5)

## 2012-03-02 LAB — PROTIME-INR: INR: 1.33 (ref 0.00–1.49)

## 2012-03-02 LAB — BASIC METABOLIC PANEL
Chloride: 97 mEq/L (ref 96–112)
Creatinine, Ser: 1.06 mg/dL (ref 0.50–1.10)
GFR calc Af Amer: 68 mL/min — ABNORMAL LOW (ref >90)
Sodium: 140 mEq/L (ref 135–145)

## 2012-03-02 NOTE — Progress Notes (Signed)
Cardiac event monitor 12/28/10- EPIC  Stress Test 12/17/10 EPIC  04/30/11 Last office visit with Dr Jens Som( cardiologist) in [facility]

## 2012-03-02 NOTE — Progress Notes (Signed)
03/02/12 0918  OBSTRUCTIVE SLEEP APNEA  Have you ever been diagnosed with sleep apnea through a sleep study? No  Do you snore loudly (loud enough to be heard through closed doors)?  0  Do you often feel tired, fatigued, or sleepy during the daytime? 1  Has anyone observed you stop breathing during your sleep? 0  Do you have, or are you being treated for high blood pressure? 1  BMI more than 35 kg/m2? 1  Age over 55 years old? 1  Neck circumference greater than 40 cm/18 inches? 1  Gender: 0  Obstructive Sleep Apnea Score 5   Score 4 or greater  Results sent to PCP

## 2012-03-02 NOTE — Telephone Encounter (Signed)
See note

## 2012-03-03 ENCOUNTER — Encounter (HOSPITAL_COMMUNITY): Payer: Self-pay | Admitting: Anesthesiology

## 2012-03-03 ENCOUNTER — Ambulatory Visit (HOSPITAL_COMMUNITY)
Admission: RE | Admit: 2012-03-03 | Discharge: 2012-03-03 | Disposition: A | Discharge status: Home / Self Care | Payer: No Typology Code available for payment source | Source: Ambulatory Visit | Attending: Urology | Admitting: Urology

## 2012-03-03 ENCOUNTER — Encounter (HOSPITAL_COMMUNITY)
Admission: RE | Disposition: A | Discharge status: Home / Self Care | Payer: Self-pay | Source: Ambulatory Visit | Attending: Urology

## 2012-03-03 ENCOUNTER — Encounter (HOSPITAL_COMMUNITY): Payer: Self-pay | Admitting: *Deleted

## 2012-03-03 ENCOUNTER — Ambulatory Visit (HOSPITAL_COMMUNITY): Payer: No Typology Code available for payment source | Admitting: Anesthesiology

## 2012-03-03 DIAGNOSIS — N201 Calculus of ureter: Secondary | ICD-10-CM | POA: Insufficient documentation

## 2012-03-03 DIAGNOSIS — I10 Essential (primary) hypertension: Secondary | ICD-10-CM | POA: Insufficient documentation

## 2012-03-03 DIAGNOSIS — J45909 Unspecified asthma, uncomplicated: Secondary | ICD-10-CM | POA: Insufficient documentation

## 2012-03-03 DIAGNOSIS — Z7901 Long term (current) use of anticoagulants: Secondary | ICD-10-CM | POA: Insufficient documentation

## 2012-03-03 DIAGNOSIS — I4891 Unspecified atrial fibrillation: Secondary | ICD-10-CM | POA: Insufficient documentation

## 2012-03-03 DIAGNOSIS — Z79899 Other long term (current) drug therapy: Secondary | ICD-10-CM | POA: Insufficient documentation

## 2012-03-03 DIAGNOSIS — E039 Hypothyroidism, unspecified: Secondary | ICD-10-CM | POA: Insufficient documentation

## 2012-03-03 DIAGNOSIS — N200 Calculus of kidney: Secondary | ICD-10-CM

## 2012-03-03 DIAGNOSIS — R310 Gross hematuria: Secondary | ICD-10-CM | POA: Insufficient documentation

## 2012-03-03 HISTORY — PX: CYSTOSCOPY W/ RETROGRADES: SHX1426

## 2012-03-03 HISTORY — PX: URETEROSCOPY: SHX842

## 2012-03-03 SURGERY — URETEROSCOPY
Anesthesia: General | Site: Ureter | Laterality: Left | Wound class: Clean Contaminated

## 2012-03-03 MED ORDER — LIDOCAINE HCL 2 % EX GEL
CUTANEOUS | Status: AC
  Filled 2012-03-03: qty 10

## 2012-03-03 MED ORDER — PROPOFOL 10 MG/ML IV BOLUS
INTRAVENOUS | Status: DC | PRN
  Administered 2012-03-03: 200 mg via INTRAVENOUS

## 2012-03-03 MED ORDER — ACETAMINOPHEN 10 MG/ML IV SOLN
INTRAVENOUS | Status: DC | PRN
  Administered 2012-03-03: 1000 mg via INTRAVENOUS

## 2012-03-03 MED ORDER — PROMETHAZINE HCL 25 MG/ML IJ SOLN: 6.2500 mg | INTRAMUSCULAR | Status: DC | PRN

## 2012-03-03 MED ORDER — CIPROFLOXACIN IN D5W 400 MG/200ML IV SOLN
400.0000 mg | INTRAVENOUS | Status: AC
  Administered 2012-03-03: 400 mg via INTRAVENOUS

## 2012-03-03 MED ORDER — IOHEXOL 300 MG/ML  SOLN
INTRAMUSCULAR | Status: DC | PRN
  Administered 2012-03-03: 20 mL

## 2012-03-03 MED ORDER — CIPROFLOXACIN IN D5W 400 MG/200ML IV SOLN
INTRAVENOUS | Status: AC
  Filled 2012-03-03: qty 200

## 2012-03-03 MED ORDER — ONDANSETRON HCL 4 MG/2ML IJ SOLN
INTRAMUSCULAR | Status: DC | PRN
  Administered 2012-03-03: 4 mg via INTRAVENOUS

## 2012-03-03 MED ORDER — LACTATED RINGERS IV SOLN
INTRAVENOUS | Status: DC
  Administered 2012-03-03: 1000 mL via INTRAVENOUS

## 2012-03-03 MED ORDER — LIDOCAINE HCL (CARDIAC) 20 MG/ML IV SOLN
INTRAVENOUS | Status: DC | PRN
  Administered 2012-03-03: 50 mg via INTRAVENOUS

## 2012-03-03 MED ORDER — SODIUM CHLORIDE 0.9 % IR SOLN
Status: DC | PRN
  Administered 2012-03-03: 3000 mL via INTRAVESICAL

## 2012-03-03 MED ORDER — ACETAMINOPHEN 10 MG/ML IV SOLN
INTRAVENOUS | Status: AC
  Filled 2012-03-03: qty 100

## 2012-03-03 MED ORDER — FENTANYL CITRATE 0.05 MG/ML IJ SOLN: 25.0000 ug | INTRAMUSCULAR | Status: DC | PRN

## 2012-03-03 MED ORDER — BELLADONNA ALKALOIDS-OPIUM 16.2-60 MG RE SUPP
RECTAL | Status: AC
  Filled 2012-03-03: qty 1

## 2012-03-03 MED ORDER — FENTANYL CITRATE 0.05 MG/ML IJ SOLN
INTRAMUSCULAR | Status: DC | PRN
  Administered 2012-03-03 (×4): 25 ug via INTRAVENOUS

## 2012-03-03 MED ORDER — MIDAZOLAM HCL 5 MG/5ML IJ SOLN
INTRAMUSCULAR | Status: DC | PRN
  Administered 2012-03-03: 2 mg via INTRAVENOUS

## 2012-03-03 MED ORDER — METOCLOPRAMIDE HCL 5 MG/ML IJ SOLN
INTRAMUSCULAR | Status: DC | PRN
  Administered 2012-03-03: 10 mg via INTRAVENOUS

## 2012-03-03 MED ORDER — IOHEXOL 300 MG/ML  SOLN
INTRAMUSCULAR | Status: AC
  Filled 2012-03-03: qty 1

## 2012-03-03 SURGICAL SUPPLY — 24 items
ADAPTER CATH URET PLST 4-6FR (CATHETERS) ×4 IMPLANT
ADPR CATH URET STRL DISP 4-6FR (CATHETERS) ×3
BAG URO CATCHER STRL LF (DRAPE) ×4 IMPLANT
BASKET ZERO TIP NITINOL 2.4FR (BASKET) IMPLANT
BSKT STON RTRVL ZERO TP 2.4FR (BASKET)
CATH INTERMIT  6FR 70CM (CATHETERS) IMPLANT
CATH URET 5FR 28IN CONE TIP (BALLOONS)
CATH URET 5FR 28IN OPEN ENDED (CATHETERS) ×4 IMPLANT
CATH URET 5FR 70CM CONE TIP (BALLOONS) IMPLANT
CLOTH BEACON ORANGE TIMEOUT ST (SAFETY) ×4 IMPLANT
DRAPE CAMERA CLOSED 9X96 (DRAPES) ×4 IMPLANT
GLOVE BIOGEL M STRL SZ7.5 (GLOVE) ×4 IMPLANT
GLOVE SURG SS PI 8.0 STRL IVOR (GLOVE) ×4 IMPLANT
GOWN PREVENTION PLUS XLARGE (GOWN DISPOSABLE) ×4 IMPLANT
GOWN STRL NON-REIN LRG LVL3 (GOWN DISPOSABLE) ×4 IMPLANT
GOWN STRL REIN XL XLG (GOWN DISPOSABLE) ×4 IMPLANT
GUIDEWIRE STR DUAL SENSOR (WIRE) ×4 IMPLANT
LASER FIBER DISP (UROLOGICAL SUPPLIES) ×1 IMPLANT
MANIFOLD NEPTUNE II (INSTRUMENTS) ×4 IMPLANT
MARKER SKIN DUAL TIP RULER LAB (MISCELLANEOUS) ×4 IMPLANT
PACK CYSTO (CUSTOM PROCEDURE TRAY) ×4 IMPLANT
STENT CONTOUR 6FRX24X.038 (STENTS) ×1 IMPLANT
TUBING CONNECTING 10 (TUBING) ×4 IMPLANT
WIRE COONS/BENSON .038X145CM (WIRE) ×4 IMPLANT

## 2012-03-03 NOTE — H&P (Signed)
History of Present Illness   [name] presents today for further discussion and potential management of some ongoing bilateral nephrolithiasis issues. This is my first encounter with her and she has been here to see Dr. Sherron Monday on two occasions. Apparently she was scheduled for some follow-up but that got canceled. The bottom line is that this is her first clinical stone experience. She has really had intermittent gross hematuria as her primary complaint. She has been on Xarelto for atrial fibrillation for the last 3-4 months . She has had some intermittent discomfort, but again, bleeding has been the biggest issue. Imaging studies showed bilateral non-obstructing stones. In addition, she had a 7-8 mm stone in her right renal pelvis which was not particularly obstructing but certainly in position to cause some irritation and hematuria. In addition, there was a 6.5 mm proximal left ureteral stone which appeared to be more in the mid ureteral position when last checked about a month ago. Again, she is here now for reassessment of all these issues.      Past Medical History Problems  1. History of  Allergic Rhinitis 477.9 2. History of  Arthritis V13.4 3. History of  Asthma 493.90 4. History of  Atrial Fibrillation 427.31 5. History of  Chronic Reflux Esophagitis 530.11 6. Former Smoker V15.82 7. History of  Hematuria 599.70 8. History of  Hypertension 401.9 9. History of  Hypoparathyroidism 252.1 10. History of  Hypothyroidism 244.9 11. History of  Iron Deficiency Anemia Secondary To Chronic Blood Loss 280.0 12. History of  Lower Back Pain 724.2 13. History of  Morbid Obesity 278.01 14. History of  Thyroid Cancer V10.87 15. History of  Vitamin D Deficiency 268.9  Surgical History Problems  1. History of  Appendectomy 2. History of  Bunion Correction With Metatarsal Osteotomy [place] Procedure 3. History of  High Gastric Bypass 4. History of  Thyroid Surgery 5. History of   Tonsillectomy With Adenoidectomy  Current Meds 1. Albuterol Sulfate (2.5 MG/3ML) 0.083% Inhalation Nebulization Solution; Therapy:  (Recorded:14Aug2013) to 2. Hydrocodone-Acetaminophen 5-325 MG Oral Tablet; TAKE 1 TABLET EVERY 4 TO 6 HOURS AS  NEEDED; Therapy: 14Aug2013 to (Evaluate:21Aug2013); Last Rx:14Aug2013 3. Promethazine HCl 25 MG Oral Tablet; TAKE 1 TABLET Every 8 hours; Therapy: 14Aug2013 to  (Last Rx:14Aug2013)  Allergies Medication  1. Allergen SOLN 2. Amoxicillin CAPS 3. Penicillins  Family History Problems  1. Family history of  No Significant Family History  Social History Problems  1. Alcohol Use  Review of Systems  Gastrointestinal: nausea, flank pain and abdominal pain.  ENT: sinus problems.  Cardiovascular: leg swelling.  Musculoskeletal: back pain.  Neurological: headache.    Vitals Vital Signs [Data Includes: Last 1 Day]  03Oct2013 10:50AM  Blood Pressure: 135 / 79 Temperature: 98 F Heart Rate: 88  Physical Exam Constitutional:. No acute distress.  Pulmonary: No respiratory distress and normal respiratory rhythm and effort.  Cardiovascular: Heart rate and rhythm are normal . No peripheral edema.  Abdomen: The abdomen is soft and nontender. No masses are palpated. No CVA tenderness. No hernias are palpable. No hepatosplenomegaly noted.    Results/Data Urine [Data Includes: Last 1 Day]   03Oct2013  COLOR YELLOW   APPEARANCE CLEAR   SPECIFIC GRAVITY 1.010   pH 6.5   GLUCOSE NEG mg/dL  BILIRUBIN NEG   KETONE NEG mg/dL  BLOOD TRACE   PROTEIN NEG mg/dL  UROBILINOGEN 0.2 mg/dL  NITRITE NEG   LEUKOCYTE ESTERASE SMALL   SQUAMOUS EPITHELIAL/HPF MODERATE   WBC 7-10  WBC/hpf  RBC 3-6 RBC/hpf  BACTERIA FEW   CRYSTALS NONE SEEN   CASTS NONE SEEN     KUB imaging was performed today. The 6-7 mm stone seen in the proximal left ureter now appears to be in the distal ureter approximately 3-4 cm above the ureterovesical junction. The patient  continues to have a fairly dense approximately 1 cm stone in the right renal pelvis as well as a number of smaller bilateral non-obstructing stones.     Assessment Assessed  1. Urinary Calculus On The Left 592.9 2. Gross Hematuria 599.71 3. Urinary Calculus Bilaterally 592.9  Plan Health Maintenance (V70.0)  1. UA With REFLEX  Done: 03Oct2013 10:42AM Urinary Calculus Bilaterally (592.9)  2. KUB  Done: 03Oct2013 12:00AM 3. Follow-up Schedule Surgery Office  Follow-up  Requested for: 03Oct2013  Discussion/Summary   [name] has continued clear evidence of a left-sided ureteral stone. She has now had this stone for approximately 2 months and has intermittently had gross hematuria, although has done reasonably well from a pain standpoint. At this point I do think it is time to go ahead with definitive intervention. She could have ureteroscopy and stay on her anticoagulation but it would make most sense to stop it the day before the procedure and hopefully we would be able to restart things quite quickly. She certainly can continue on the anticoagulation as long as she is not having any severe bleeding and does not need to stop it every time she sees some blood. Obviously there are pros and cons on staying on the blood thinner at this juncture. Once we have definitively managed the left ureteral stone with ureteroscopy and probable Holmium laser lithotripsy, I would recommend ESWL for the right-sided renal calculus. This could be done at a later date. That stone also may be contributing to her hematuria and clearly after the ESWL, she would need to stop the Xarelto for a few days before and certainly 4-5 days after. We would want to remember to put her in the post ESWL hematoma study to check an ultrasound and to be sure she had no problems with resumption of anticoagulation.     Signatures Electronically signed by : Barron Alvine, M.D.; Feb 26 2012  4:45PM

## 2012-03-03 NOTE — Op Note (Signed)
Preoperative diagnosis: Left ureteral calculus and right renal calculus Postoperative diagnosis: Same  Procedure:  1. Cystoscopy 2. Left ureteroscopy and stone removal 3. Ureteroscopic laser lithotripsy 4. Left ureteral stent placement (6 [place]) 24 cm 5. Bilateral retrograde pyelography with interpretation  Surgeon: Valetta Fuller, MD  Anesthesia: General  Complications: None  Intraoperative findings: Bilateral retrograde pyelography demonstrated a filling defect within the distal left ureter ureter consistent with the patient's known calculus without other abnormalities. On the right side the patient had a large filling defect in the renal pelvis without obvious obstruction. Both retrogrades were done with fluoroscopic interpretation  EBL: Minimal  Specimens: 1. Left ureteral stone fragments  Disposition of specimens: [facility] for stone analysis  Indication: [name] is a 56 y.o.   patient with urolithiasis. After reviewing the management options for treatment, the patient elected to proceed with the above surgical procedure(s). We have discussed the potential benefits and risks of the procedure, side effects of the proposed treatment, the likelihood of the patient achieving the goals of the procedure, and any potential problems that might occur during the procedure or recuperation. Informed consent has been obtained.  Description of procedure:  The patient was taken to the operating room and general anesthesia was induced.  The patient was placed in the dorsal lithotomy position, prepped and draped in the usual sterile fashion, and preoperative antibiotics were administered. A preoperative time-out was performed.   Cystourethroscopy was performed.   The bladder was then systematically examined in its entirety. There was no evidence for any bladder tumors, stones, or other mucosal pathology.    Attention then turned to the right ureteral orifice  ureteral orifice and a ureteral catheter was used to intubate the ureteral orifice.  Omnipaque contrast was injected through the ureteral catheter and a retrograde pyelogram was performed with findings as dictated above. Retrograde pyelography was then done on the left side with findings noted above.  A 0.38 sensor guidewire was then advanced up the left ureter into the renal pelvis under fluoroscopic guidance. The 6 Fr semirigid ureteroscope was then advanced into the ureter next to the guidewire and the calculus was identified.   The stone was then fragmented with the 365 micron holmium laser fiber on a setting of 0.5 J and frequency of 8 Hz.   All stones were then removed from the ureter with a zero tip nitinol basket.  Reinspection of the ureter revealed no remaining visible stones or fragments.   The wire was then backloaded through the cystoscope and a ureteral stent was advance over the wire using Seldinger technique.  The stent was positioned appropriately under fluoroscopic and cystoscopic guidance.  The wire was then removed with an adequate stent curl noted in the renal pelvis as well as in the bladder.  The bladder was then emptied and the procedure ended.  The patient appeared to tolerate the procedure well and without complications.  The patient was able to be awakened and transferred to the recovery unit in satisfactory condition.

## 2012-03-03 NOTE — Progress Notes (Signed)
No incision. No drainage present around surgical site.

## 2012-03-03 NOTE — Anesthesia Preprocedure Evaluation (Signed)
Anesthesia Evaluation  Patient identified by MRN, date of birth, ID band Patient awake    Reviewed: Allergy & Precautions, H&P , NPO status , Patient's Chart, lab work & pertinent test results  Airway Mallampati: II TM Distance: >3 FB Neck ROM: Full    Dental No notable dental hx.    Pulmonary asthma , Current Smoker,  breath sounds clear to auscultation  Pulmonary exam normal       Cardiovascular hypertension, Pt. on medications + dysrhythmias Rhythm:Regular Rate:Normal     Neuro/Psych  Headaches, negative neurological ROS  negative psych ROS   GI/Hepatic Neg liver ROS, GERD-  Medicated,  Endo/Other  Hypothyroidism Morbid obesity  Renal/GU negative Renal ROS  negative genitourinary   Musculoskeletal negative musculoskeletal ROS (+)   Abdominal (+) + obese,   Peds negative pediatric ROS (+)  Hematology negative hematology ROS (+)   Anesthesia Other Findings   Reproductive/Obstetrics negative OB ROS                           Anesthesia Physical Anesthesia Plan  ASA: III  Anesthesia Plan: General   Post-op Pain Management:    Induction: Intravenous  Airway Management Planned: Oral ETT  Additional Equipment:   Intra-op Plan:   Post-operative Plan: Extubation in OR  Informed Consent: I have reviewed the patients History and Physical, chart, labs and discussed the procedure including the risks, benefits and alternatives for the proposed anesthesia with the patient or authorized representative who has indicated his/her understanding and acceptance.   Dental advisory given  Plan Discussed with: CRNA  Anesthesia Plan Comments:         Anesthesia Quick Evaluation

## 2012-03-03 NOTE — Transfer of Care (Signed)
Immediate Anesthesia Transfer of Care Note  Patient: [name]  Procedure(s) Performed: Procedure(s) (LRB): URETEROSCOPY (Left) HOLMIUM LASER APPLICATION (Left) CYSTOSCOPY WITH STENT PLACEMENT (Left) CYSTOSCOPY WITH RETROGRADE PYELOGRAM (Bilateral)  Patient Location: PACU  Anesthesia Type: General  Level of Consciousness: sedated, patient cooperative and responds to stimulaton  Airway & Oxygen Therapy: Patient Spontanous Breathing and Patient connected to face mask oxgen  Post-op Assessment: Report given to PACU RN and Post -op Vital signs reviewed and stable  Post vital signs: Reviewed and stable  Complications: No apparent anesthesia complications

## 2012-03-03 NOTE — Anesthesia Postprocedure Evaluation (Signed)
  Anesthesia Post-op Note  Patient: [name]  Procedure(s) Performed: Procedure(s) (LRB): URETEROSCOPY (Left) HOLMIUM LASER APPLICATION (Left) CYSTOSCOPY WITH STENT PLACEMENT (Left) CYSTOSCOPY WITH RETROGRADE PYELOGRAM (Bilateral)  Patient Location: PACU  Anesthesia Type: General  Level of Consciousness: awake and alert   Airway and Oxygen Therapy: Patient Spontanous Breathing  Post-op Pain: mild  Post-op Assessment: Post-op Vital signs reviewed, Patient's Cardiovascular Status Stable, Respiratory Function Stable, Patent Airway and No signs of Nausea or vomiting  Post-op Vital Signs: stable  Complications: No apparent anesthesia complications. No OSA on sleep study per patient.

## 2012-03-04 ENCOUNTER — Encounter (HOSPITAL_COMMUNITY): Payer: Self-pay | Admitting: Urology

## 2012-03-06 ENCOUNTER — Other Ambulatory Visit: Payer: Self-pay | Admitting: Family

## 2012-03-08 NOTE — Telephone Encounter (Addendum)
OK to refill furosemide.  #30 with 2 refills.  Tramadol was given for low back pain which should be better after her urology procedure.  Defer pain meds to Urology for this.

## 2012-03-08 NOTE — Telephone Encounter (Signed)
Pt requesting refill of furosemide and tramadol.  Both meds last filled 01/10/12 #30 x no refills each. Please advise how many refills we may give?

## 2012-03-08 NOTE — Telephone Encounter (Signed)
Furosemide sent, notified pharmacist to send tramadol request to Dr Isabel [facility]).

## 2012-03-11 ENCOUNTER — Ambulatory Visit: Payer: No Typology Code available for payment source

## 2012-03-12 ENCOUNTER — Ambulatory Visit (INDEPENDENT_AMBULATORY_CARE_PROVIDER_SITE_OTHER): Payer: No Typology Code available for payment source | Admitting: Family

## 2012-03-12 ENCOUNTER — Telehealth: Payer: Self-pay | Admitting: Family

## 2012-03-12 ENCOUNTER — Ambulatory Visit: Payer: No Typology Code available for payment source

## 2012-03-12 ENCOUNTER — Ambulatory Visit (INDEPENDENT_AMBULATORY_CARE_PROVIDER_SITE_OTHER): Payer: No Typology Code available for payment source

## 2012-03-12 VITALS — BP 136/80 | HR 81 | Temp 98.0°F | Resp 18

## 2012-03-12 DIAGNOSIS — R319 Hematuria, unspecified: Secondary | ICD-10-CM

## 2012-03-12 DIAGNOSIS — R531 Weakness: Secondary | ICD-10-CM

## 2012-03-12 DIAGNOSIS — Z23 Encounter for immunization: Secondary | ICD-10-CM

## 2012-03-12 LAB — CBC WITH DIFFERENTIAL/PLATELET
Basophils Absolute: 0.1 10*3/uL (ref 0.0–0.1)
Eosinophils Absolute: 0.4 10*3/uL (ref 0.0–0.7)
Eosinophils Relative: 3 % (ref 0–5)
HCT: 32.4 % — ABNORMAL LOW (ref 36.0–46.0)
Lymphocytes Relative: 20 % (ref 12–46)
MCH: 23.7 pg — ABNORMAL LOW (ref 26.0–34.0)
MCHC: 31.5 g/dL (ref 30.0–36.0)
MCV: 75.3 fL — ABNORMAL LOW (ref 78.0–100.0)
Monocytes Absolute: 0.7 10*3/uL (ref 0.1–1.0)
Platelets: 229 10*3/uL (ref 150–400)
RDW: 17.7 % — ABNORMAL HIGH (ref 11.5–15.5)

## 2012-03-12 MED ORDER — CIPROFLOXACIN HCL 500 MG PO TABS: 500.0000 mg | ORAL_TABLET | Freq: Two times a day (BID) | ORAL | Status: DC

## 2012-03-12 MED ORDER — TRAMADOL HCL 50 MG PO TABS: 50.0000 mg | ORAL_TABLET | Freq: Four times a day (QID) | ORAL | Status: DC | PRN

## 2012-03-12 NOTE — Telephone Encounter (Signed)
Flu shot administered and order given to pt for STAT CBC.  Pt very tired appearing. Has history of anemia. Recent surgery on 03/03/12. Currently taking Nu Iron 150mg  every 4 days per pt.

## 2012-03-12 NOTE — Telephone Encounter (Signed)
Pt booked for appointment.

## 2012-03-12 NOTE — Assessment & Plan Note (Signed)
Likely related to recent trauma from urological procedure and exacerbated by xarelto.  Obtain stat CBC, hold xarelto for now.  Will plan to send urine for culture due to dysuria and fatigue and treat empirically with cipro due to recent urological procedure.

## 2012-03-12 NOTE — Patient Instructions (Addendum)
Please follow up with Urology on 11/4. Follow up 1-2 weeks.

## 2012-03-12 NOTE — Progress Notes (Signed)
Subjective:    Patient ID: [name], female    DOB: 1955/08/18, 56 y.o.   MRN: [id number]  HPI  [name] is a 56 yr old female who presents today with chief complaint of fatigue and hematuria.  She is s/p ureteroscopy on 10/9 with stone removal and stent placement.  She reports that stent was removed on 10/14.  She has had hematuria since the original procedure on 10/9.  Xarelto was held for 2 days prior to the procedure and resumed 2 days following the procedure.  She reports that initially her urine had blood clots for the first few days.  Since that time, her urine has been red in color. She describes her urine to be "the color of the juice from a tomato."  She is concerned about progressive weakness and worried that she has worsening anemia.  Pt reports feeling tired and not sleeping well due to some left sciatic pain and right shoulder pain which she tells me is being managed by Dr. Mayford Knife with tylenol which does help. Review of Systems  Constitutional: Negative for fever.  Genitourinary: Positive for dysuria and hematuria.   See HPI  Past Medical History  Diagnosis Date  . Allergic rhinitis   . Asthma   . Hypertension   . Hypothyroidism   . Anemia, iron deficiency   . Vitamin D deficiency   . Thyroid cancer   . Morbid obesity   . GERD (gastroesophageal reflux disease)   . Hypoparathyroidism 01/07/2011  . Arthritis   . Atrial fibrillation August 2012  . Fatty liver 01/07/2012  . Headache     occasional sinus headache     History   Social History  . Marital Status: Single    Spouse Name: N/A    Number of Children: 0  . Years of Education: N/A   Occupational History  . works in Medical laboratory scientific officer   . [employer]    Social History Main Topics  . Smoking status: Former Smoker -- 10 years    Quit date: 05/27/1995  . Smokeless tobacco: Never Used  . Alcohol Use: Yes     Occasional - one to three per month  . Drug Use: No  . Sexually Active: Yes --  Female partner(s)   Other Topics Concern  . 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  She was light smoker for 10 years.           Past Surgical History  Procedure Date  . Tonsillectomy and adenoidectomy   . Bunionectomy     bilateral  . Appendectomy   . Gastric bypass   . Thyroidectomy 05/15/2010  . Cyst on ovary removed    . Ureteroscopy 03/03/2012    Procedure: URETEROSCOPY;  Surgeon: Valetta Fuller, MD;  Location: [facility];  Service: Urology;  Laterality: Left;  . Cystoscopy w/ retrogrades 03/03/2012    Procedure: CYSTOSCOPY WITH RETROGRADE PYELOGRAM;  Surgeon: Valetta Fuller, MD;  Location: [facility];  Service: Urology;  Laterality: Bilateral;    Family History  Problem Relation Age of Onset  . Hypertension Father   . Diabetes Father   . Lung cancer Father   . Heart attack Father     MI at age 86  . Hypertension Mother   . Hyperthyroidism Mother   . Heart disease Mother   . Heart  attack Mother     MI at age 58  . Asthma Brother     Allergies  Allergen Reactions  . Amoxicillin-Pot Clavulanate   . Penicillins     Current Outpatient Prescriptions on File Prior to Visit  Medication Sig Dispense Refill  . albuterol (PROVENTIL) (2.5 MG/3ML) 0.083% nebulizer solution 1 vial 4 times a day as needed  75 mL  1  . Ascorbic Acid (VITAMIN C CR) 1000 MG TBCR Take 1 twice a week      . calcitRIOL (ROCALTROL) 0.5 MCG capsule 3 tablets twice a day      . calcium elemental as carbonate (BARIATRIC TUMS ULTRA) 400 MG tablet Chew 2,000 mg by mouth 2 (two) times daily.       . cetirizine (ZYRTEC) 10 MG tablet Take 10 mg by mouth daily.      . Cyanocobalamin (VITAMIN B 12 PO) Take 1 tablet by mouth every 3 (three) days. 500 mcg once a week      . diltiazem (CARDIZEM CD) 240 MG 24 hr capsule Take 1 capsule (240 mg total) by mouth daily.  30 capsule  11  . DIOVAN 80 MG tablet  TAKE ONE TABLET BY MOUTH EVERY DAY-- PER MD, PT DUE FOR OFFICE VISIT IN AUGUST  30 each  0  . ergocalciferol (VITAMIN D2) 50000 UNITS capsule Take 1 capsule (50,000 Units total) by mouth once a week.  4 capsule  11  . fluticasone (FLONASE) 50 MCG/ACT nasal spray USE TWO SPRAYS IN EACH NOSTRIL EVERY DAY  16 g  2  . furosemide (LASIX) 40 MG tablet TAKE ONE TABLET BY MOUTH EVERY DAY  30 tablet  2  . glucosamine-chondroitin 500-400 MG tablet Take 1 tablet by mouth once a week.       . hydrochlorothiazide (HYDRODIURIL) 25 MG tablet TAKE ONE TABLET BY MOUTH EVERY DAY  30 tablet  2  . HYDROcodone-acetaminophen (NORCO) 10-325 MG per tablet Take 1 tablet by mouth every 6 (six) hours as needed.      . iron polysaccharides (NU-IRON) 150 MG capsule Take 150 mg by mouth every 3 (three) days.       [name] levothyroxine (SYNTHROID, LEVOTHROID) 137 MCG tablet Take 3 pills once a day.      Maxwell Caul Bicarbonate (ZEGERID) 20-1100 MG CAPS Take 1 capsule by mouth daily before breakfast.       . Rivaroxaban (XARELTO) 20 MG TABS Take 20 mg by mouth daily.  30 tablet  12  . SYMBICORT 160-4.5 MCG/ACT inhaler INHALE TWO PUFFS BY MOUTH TWICE DAILY  11 g  2  . VENTOLIN HFA 108 (90 BASE) MCG/ACT inhaler INHALE TWO PUFFS EVERY 6 HOURS AS NEEDED FOR WHEEZING OR SHORTNESS OF BREATH  18 g  2  . zafirlukast (ACCOLATE) 20 MG tablet Take 1 tablet (20 mg total) by mouth 2 (two) times daily.  60 tablet  2    BP 136/80  Pulse 81  Temp 98 F (36.7 C) (Oral)  Resp 18  SpO2 97%  LMP 05/26/2009       Objective:   Physical Exam  Constitutional: She appears well-developed and well-nourished. No distress.  Cardiovascular: Normal rate and regular rhythm.   No murmur heard. Pulmonary/Chest: Effort normal and breath sounds normal. No respiratory distress. She has no wheezes. She has no rales. She exhibits no tenderness.  Musculoskeletal: She exhibits no edema.  Psychiatric:       Tearful but pleasant.  Assessment & Plan:

## 2012-03-12 NOTE — Telephone Encounter (Signed)
Reviewed cbc result with pt. Instructed her to go to the ER if she develops clotting/worsening hematuria. Otherwise, recommended that she touch base with [place] next week to let us know how she is doing.  Advised her that I expect the hematuria to lessen over the next few days with the xarelto on hold.

## 2012-03-12 NOTE — Telephone Encounter (Signed)
Patient came for flushot this morning.  Rescheduled her to 130 this afternoon.  She wants to have a lab draw for a cbc says she is feeling weak.

## 2012-03-12 NOTE — Telephone Encounter (Signed)
OK 

## 2012-03-15 ENCOUNTER — Encounter: Payer: Self-pay | Admitting: Family

## 2012-03-16 ENCOUNTER — Telehealth: Payer: Self-pay | Admitting: Family

## 2012-03-16 NOTE — Telephone Encounter (Signed)
Spoke with pt. She reports no bleeding.  She remains off of xarelto.  I recommended that she plan to resume xarelto on Saturday 10/26. Call me if further hematuria.  She verbalizes understanding.

## 2012-03-24 ENCOUNTER — Other Ambulatory Visit: Payer: Self-pay | Admitting: Family

## 2012-03-26 ENCOUNTER — Telehealth (INDEPENDENT_AMBULATORY_CARE_PROVIDER_SITE_OTHER): Payer: Self-pay

## 2012-03-26 ENCOUNTER — Encounter (INDEPENDENT_AMBULATORY_CARE_PROVIDER_SITE_OTHER): Payer: Self-pay

## 2012-03-26 HISTORY — PX: LITHOTRIPSY: SUR834

## 2012-03-26 NOTE — Telephone Encounter (Signed)
Recall letter mailed to pt. Exam only[ no tests]

## 2012-03-30 ENCOUNTER — Ambulatory Visit (INDEPENDENT_AMBULATORY_CARE_PROVIDER_SITE_OTHER): Payer: No Typology Code available for payment source | Admitting: Family

## 2012-03-30 ENCOUNTER — Encounter: Payer: Self-pay | Admitting: Family

## 2012-03-30 ENCOUNTER — Other Ambulatory Visit: Payer: Self-pay | Admitting: Urology

## 2012-03-30 VITALS — BP 124/60 | HR 80 | Temp 97.8°F | Resp 16 | Ht 64.0 in | Wt 321.1 lb

## 2012-03-30 DIAGNOSIS — Z0000 Encounter for general adult medical examination without abnormal findings: Secondary | ICD-10-CM

## 2012-03-30 DIAGNOSIS — I10 Essential (primary) hypertension: Secondary | ICD-10-CM

## 2012-03-30 DIAGNOSIS — N200 Calculus of kidney: Secondary | ICD-10-CM

## 2012-03-30 DIAGNOSIS — R5381 Other malaise: Secondary | ICD-10-CM

## 2012-03-30 DIAGNOSIS — R5383 Other fatigue: Secondary | ICD-10-CM

## 2012-03-30 LAB — LIPID PANEL
Cholesterol: 122 mg/dL (ref 0–200)
Triglycerides: 107 mg/dL (ref <150)

## 2012-03-30 LAB — BASIC METABOLIC PANEL
BUN: 20 mg/dL (ref 6–23)
Calcium: 10.4 mg/dL (ref 8.4–10.5)
Creat: 1.05 mg/dL (ref 0.50–1.10)
Glucose, Bld: 85 mg/dL (ref 70–99)
Potassium: 3.3 mEq/L — ABNORMAL LOW (ref 3.5–5.3)

## 2012-03-30 MED ORDER — TRAMADOL HCL 50 MG PO TABS: 50.0000 mg | ORAL_TABLET | Freq: Four times a day (QID) | ORAL | Status: DC | PRN

## 2012-03-30 NOTE — Progress Notes (Signed)
Subjective:    Patient ID: [name], female    DOB: Jun 24, 1955, 56 y.o.   MRN: [id number]  HPI  [name] is a 56 yr old female who presents today for her 2 week follow up. She reports that she resumed the xarelto and on Tuesday through Thursday of last week she developed slight nausea and mild hematuria. She followed back up with Urology and is scheduled for lithotripsy of the remaining stone.  She has had no further hematuria and is back on xarelto.   Fatigue-  She is back to work at 75 percent.   She has been trying to rest and notes improvement in her symptoms. Review of Systems    see HPI  Past Medical History  Diagnosis Date  . Allergic rhinitis   . Asthma   . Hypertension   . Hypothyroidism   . Anemia, iron deficiency   . Vitamin D deficiency   . Thyroid cancer   . Morbid obesity   . GERD (gastroesophageal reflux disease)   . Hypoparathyroidism 01/07/2011  . Arthritis   . Atrial fibrillation August 2012  . Fatty liver 01/07/2012  . Headache     occasional sinus headache     History   Social History  . Marital Status: Single    Spouse Name: N/A    Number of Children: 0  . Years of Education: N/A   Occupational History  . works in Medical laboratory scientific officer   . [employer]ER [employer]    Social History Main Topics  . Smoking status: Former Smoker -- 10 years    Quit date: 05/27/1995  . Smokeless tobacco: Never Used  . Alcohol Use: Yes     Comment: Occasional - one to three per month  . Drug Use: No  . Sexually Active: Yes -- Female partner(s)   Other Topics Concern  . 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  She was light smoker for 10 years.           Past Surgical History  Procedure Date  . Tonsillectomy and adenoidectomy   . Bunionectomy     bilateral  . Appendectomy   . Gastric bypass   . Thyroidectomy 05/15/2010  . Cyst on ovary  removed    . Ureteroscopy 03/03/2012    Procedure: URETEROSCOPY;  Surgeon: Jamie Fuller, MD;  Location: [facility];  Service: Urology;  Laterality: Left;  . Cystoscopy w/ retrogrades 03/03/2012    Procedure: CYSTOSCOPY WITH RETROGRADE PYELOGRAM;  Surgeon: Jamie Fuller, MD;  Location: [facility];  Service: Urology;  Laterality: Bilateral;    Family History  Problem Relation Age of Onset  . Hypertension Father   . Diabetes Father   . Lung cancer Father   . Heart attack Father     MI at age 62  . Hypertension Mother   . Hyperthyroidism Mother   . Heart disease Mother   . Heart attack Mother     MI at age 4  . Asthma Brother     Allergies  Allergen Reactions  . Amoxicillin-Pot Clavulanate   . Penicillins     Current Outpatient Prescriptions on File Prior to Visit  Medication Sig Dispense Refill  . albuterol (PROVENTIL) (2.5 MG/3ML) 0.083% nebulizer solution 1 vial 4 times a day as needed  75 mL  1  . Ascorbic Acid (VITAMIN C CR)  1000 MG TBCR Take 1 twice a week      . calcitRIOL (ROCALTROL) 0.5 MCG capsule 3 tablets twice a day      . calcium elemental as carbonate (BARIATRIC TUMS ULTRA) 400 MG tablet Chew 2,000 mg by mouth 2 (two) times daily.       . cetirizine (ZYRTEC) 10 MG tablet Take 10 mg by mouth daily.      . ciprofloxacin (CIPRO) 500 MG tablet Take 1 tablet (500 mg total) by mouth 2 (two) times daily.  6 tablet  0  . Cyanocobalamin (VITAMIN B 12 PO) Take 1 tablet by mouth every 3 (three) days. 500 mcg once a week      . diltiazem (CARDIZEM CD) 240 MG 24 hr capsule Take 1 capsule (240 mg total) by mouth daily.  30 capsule  11  . ergocalciferol (VITAMIN D2) 50000 UNITS capsule Take 1 capsule (50,000 Units total) by mouth once a week.  4 capsule  11  . fluticasone (FLONASE) 50 MCG/ACT nasal spray USE TWO SPRAYS IN EACH NOSTRIL EVERY DAY  16 g  2  . furosemide (LASIX) 40 MG tablet TAKE ONE TABLET BY MOUTH EVERY DAY  30 tablet  2  . glucosamine-chondroitin 500-400 MG tablet Take  1 tablet by mouth once a week.       . hydrochlorothiazide (HYDRODIURIL) 25 MG tablet TAKE ONE TABLET BY MOUTH EVERY DAY  30 tablet  2  . HYDROcodone-acetaminophen (NORCO) 10-325 MG per tablet Take 1 tablet by mouth every 6 (six) hours as needed.      . iron polysaccharides (NU-IRON) 150 MG capsule Take 150 mg by mouth every 3 (three) days.       [name] levothyroxine (SYNTHROID, LEVOTHROID) 137 MCG tablet Take 3 pills once a day.      [name] Bicarbonate (ZEGERID) 20-1100 MG CAPS Take 1 capsule by mouth daily before breakfast.       . Rivaroxaban (XARELTO) 20 MG TABS Take 20 mg by mouth daily.  30 tablet  12  . SYMBICORT 160-4.5 MCG/ACT inhaler INHALE TWO PUFFS BY MOUTH TWICE DAILY  11 g  2  . valsartan (DIOVAN) 80 MG tablet Take 1 tablet (80 mg total) by mouth daily.  30 tablet  2  . VENTOLIN HFA 108 (90 BASE) MCG/ACT inhaler INHALE TWO PUFFS EVERY 6 HOURS AS NEEDED FOR WHEEZING OR SHORTNESS OF BREATH  18 g  2  . zafirlukast (ACCOLATE) 20 MG tablet Take 1 tablet (20 mg total) by mouth 2 (two) times daily.  60 tablet  2    BP 124/60  Pulse 80  Temp 97.8 F (36.6 C) (Oral)  Resp 16  Ht 5\' 4"  (1.626 m)  Wt 321 lb 1.3 oz (145.641 kg)  BMI 55.11 kg/m2  SpO2 97%  LMP 05/26/2009    Objective:   Physical Exam  Constitutional: She appears well-developed and well-nourished. No distress.  Cardiovascular: Normal rate.   No murmur heard. Pulmonary/Chest: Effort normal and breath sounds normal. No respiratory distress. She has no wheezes. She has no rales. She exhibits no tenderness.  Psychiatric: She has a normal mood and affect. Her behavior is normal. Judgment and thought content normal.          Assessment & Plan:

## 2012-03-30 NOTE — Patient Instructions (Addendum)
Please complete your blood work prior to leaving.  Follow up in 6 weeks, sooner if problems/concerns.  

## 2012-03-30 NOTE — Assessment & Plan Note (Signed)
She will follow up with Urology for lithotripsy. Hopefully after procedure she will be able to continue xarelto without incident.

## 2012-03-30 NOTE — Assessment & Plan Note (Signed)
Improved.  Her last hemoglobin was stable. Monitor.

## 2012-03-31 ENCOUNTER — Telehealth: Payer: Self-pay | Admitting: Family

## 2012-03-31 ENCOUNTER — Encounter (HOSPITAL_COMMUNITY): Payer: Self-pay | Admitting: *Deleted

## 2012-03-31 MED ORDER — POTASSIUM CHLORIDE CRYS ER 20 MEQ PO TBCR: 20.0000 meq | EXTENDED_RELEASE_TABLET | Freq: Every day | ORAL | Status: DC

## 2012-03-31 NOTE — Telephone Encounter (Signed)
Please call pt and let her know that her potassium is still low.  I would like her to add a potassium supplement once a day and repeat bmet in 2 weeks (dx hypokalemia) please.  Overall, cholesterol looks good but HDL "good cholesterol" could be higher.  Exercise can help this.

## 2012-03-31 NOTE — Pre-Procedure Instructions (Signed)
Asked to bring blue folder the day of the procedure,insurance card,I.D. driver's license,wear comfortable clothing and have a driver for the day. Asked not to take Advil,Motrin,Ibuprofen,Aleve or any NSAIDS, Aspirin, or Toradol for 72 hours prior to procedure,  No vitamins or herbal medications 7 days prior to procedure. Instructed to take laxative per doctor's office instructions and eat a light dinner the evening before procedure.   To arrive at 0800 for lithotripsy procedure. Waiting on call back from Dr. Jamesetta Geralds re" stopping [name] 3 days before procedure. Called and so=poke with Pam hasnnot called back yet.

## 2012-04-01 ENCOUNTER — Encounter (HOSPITAL_COMMUNITY): Payer: Self-pay

## 2012-04-08 ENCOUNTER — Telehealth: Payer: Self-pay | Admitting: Cardiology

## 2012-04-08 NOTE — Telephone Encounter (Signed)
New Problem:    Called in wanting to follow up on a surgical clearance request that she had sent in yesterday.  Patient needs to stop their coumadin today.  Please call back.

## 2012-04-08 NOTE — Telephone Encounter (Signed)
[facility] with [facility] at [facility], aware per dr Jens Som the pt may hold pradaxa 2 days prior to the procedure and resume 2 days after the procedure. [facility].

## 2012-04-09 NOTE — H&P (Signed)
History of Present Illness     [name] presents today for further discussion and potential management of some ongoing nephrolithiasis issues.   Imaging studies showed bilateral non-obstructing stones.  In addition, she had a 7-8 mm stone in her right renal pelvis which was not particularly obstructing but certainly in position to cause some irritation and hematuria.  In addition, there was a 6.5 mm proximal left ureteral stone which appeared to be more in the mid ureteral position when last checked about a month ago.  Again, she is here now for reassessment of all these issues. The patient underwent left-sided ureteroscopy with holmium laser lithotripsy approximately a month ago. She did well with that treatment. Stone analysis revealed her to have primarily a calcium oxalate nephrolithiasis. She is now here to discuss treatment for her right renal pelvic stone. We felt that that would be best managed with ESWL.    Past Medical History Problems  1. History of  Allergic Rhinitis 477.9 2. History of  Arthritis V13.4 3. History of  Asthma 493.90 4. History of  Atrial Fibrillation 427.31 5. History of  Chronic Reflux Esophagitis 530.11 6. Former Smoker V15.82 7. History of  Hematuria 599.70 8. History of  Hypertension 401.9 9. History of  Hypoparathyroidism 252.1 10. History of  Hypothyroidism 244.9 11. History of  Iron Deficiency Anemia Secondary To Chronic Blood Loss 280.0 12. History of  Lower Back Pain 724.2 13. History of  Morbid Obesity 278.01 14. History of  Thyroid Cancer V10.87 15. History of  Vitamin D Deficiency 268.9  Surgical History Problems  1. History of  Appendectomy 2. History of  Bunion Correction With Metatarsal Osteotomy [place] Procedure 3. History of  Cystoscopy With Insertion Of Ureteral Stent Left 4. History of  Cystoscopy With Ureteroscopy With Lithotripsy 5. History of  High Gastric Bypass 6. History of  Thyroid Surgery 7. History of  Tonsillectomy With  Adenoidectomy  Current Meds 1. Albuterol Sulfate (2.5 MG/3ML) 0.083% Inhalation Nebulization Solution; Therapy:  (Recorded:14Aug2013) to 2. Hydrocodone-Acetaminophen 5-325 MG Oral Tablet; TAKE ONE TABLET BY MOUTH EVERY 4 TO  6 HOURS AS NEEDED; Therapy: 14Aug2013 to (Evaluate:18Oct2013)  Requested for:  04Oct2013; Last Rx:04Oct2013 3. Promethazine HCl 25 MG Oral Tablet; TAKE 1 TABLET Every 8 hours; Therapy: 14Aug2013 to  (Last Rx:14Aug2013)  Allergies Medication  1. Allergen SOLN 2. Amoxicillin CAPS 3. Penicillins  Family History Problems  1. Family history of  No Significant Family History  Social History Problems  1. Alcohol Use  Review of Systems  Genitourinary: hematuria.  Gastrointestinal: nausea, flank pain and abdominal pain.  ENT: sinus problems.  Cardiovascular: leg swelling.  Musculoskeletal: back pain.  Neurological: headache.    Vitals Vital Signs [Data Includes: Last 1 Day]  04Nov2013 02:12PM  Blood Pressure: 142 / 80 Temperature: 97.4 F Heart Rate: 85  Well-developed well-nourished female in no acute distress. Respiratory: Normal effort Cardiac: Regular rate and rhythm Abdomen: Soft nontender Extremities: No edema or tenderness Neurology: Nonfocal  Results/Data Urine [Data Includes: Last 1 Day]   04Nov2013 COLOR RED  APPEARANCE CLOUDY  SPECIFIC GRAVITY 1.015  pH 6.0  GLUCOSE NEG mg/dL BILIRUBIN NEG  KETONE NEG mg/dL BLOOD LARGE  PROTEIN NEG mg/dL UROBILINOGEN 0.2 mg/dL NITRITE NEG  LEUKOCYTE ESTERASE TRACE  SQUAMOUS EPITHELIAL/HPF NONE SEEN  WBC 0-2 WBC/hpf RBC 21-50 RBC/hpf BACTERIA NONE SEEN  CRYSTALS NONE SEEN  CASTS NONE SEEN   Assessment Assessed  1. Nephrolithiasis 592.0  Plan Health Maintenance (V70.0)  1. UA With REFLEX  Done:  04Nov2013 02:04PM  Discussion/Summary  [name] has done well with her ureteroscopy. She again has a remaining stone in the right renal pelvis and has had some intermittent right-sided  abdominal and flank pain with nausea and hematuria. I do not see any real indication for additional imaging at this time. I have recommended treatment given the current location and size of the stone I would recommend ESWL. She appears to understand the advantages and disadvantages of that treatment as well as the potential for incomplete fragmentation and the necessity on occasion for additional procedures. We will need to get permission from her cardiologist to hold her anticoagulation for a few days preoperatively and for 5 days postoperatively. Again we'll need to consider her for the renal hematoma study.

## 2012-04-12 ENCOUNTER — Encounter (HOSPITAL_COMMUNITY): Payer: Self-pay | Admitting: *Deleted

## 2012-04-12 ENCOUNTER — Ambulatory Visit (HOSPITAL_COMMUNITY)
Admission: RE | Admit: 2012-04-12 | Discharge: 2012-04-12 | Disposition: A | Discharge status: Home / Self Care | Payer: No Typology Code available for payment source | Source: Ambulatory Visit | Attending: Urology | Admitting: Urology

## 2012-04-12 ENCOUNTER — Encounter (HOSPITAL_COMMUNITY)
Admission: RE | Disposition: A | Discharge status: Home / Self Care | Payer: Self-pay | Source: Ambulatory Visit | Attending: Urology

## 2012-04-12 ENCOUNTER — Ambulatory Visit (HOSPITAL_COMMUNITY): Payer: No Typology Code available for payment source

## 2012-04-12 DIAGNOSIS — Z79899 Other long term (current) drug therapy: Secondary | ICD-10-CM | POA: Insufficient documentation

## 2012-04-12 DIAGNOSIS — I10 Essential (primary) hypertension: Secondary | ICD-10-CM | POA: Insufficient documentation

## 2012-04-12 DIAGNOSIS — J45909 Unspecified asthma, uncomplicated: Secondary | ICD-10-CM | POA: Insufficient documentation

## 2012-04-12 DIAGNOSIS — N200 Calculus of kidney: Secondary | ICD-10-CM | POA: Insufficient documentation

## 2012-04-12 LAB — PREGNANCY, URINE: Preg Test, Ur: NEGATIVE

## 2012-04-12 SURGERY — LITHOTRIPSY, ESWL
Anesthesia: LOCAL | Laterality: Right

## 2012-04-12 MED ORDER — HYDROCODONE-ACETAMINOPHEN 5-325 MG PO TABS
2.0000 | ORAL_TABLET | Freq: Once | ORAL | Status: AC
  Administered 2012-04-12: 1 via ORAL
  Filled 2012-04-12: qty 1

## 2012-04-12 MED ORDER — DIAZEPAM 5 MG PO TABS
10.0000 mg | ORAL_TABLET | ORAL | Status: AC
  Administered 2012-04-12: 10 mg via ORAL
  Filled 2012-04-12: qty 2

## 2012-04-12 MED ORDER — DEXTROSE-NACL 5-0.45 % IV SOLN
INTRAVENOUS | Status: DC
  Administered 2012-04-12: 10:00:00 via INTRAVENOUS

## 2012-04-12 MED ORDER — CIPROFLOXACIN IN D5W 400 MG/200ML IV SOLN
400.0000 mg | INTRAVENOUS | Status: AC
  Administered 2012-04-12: 400 mg via INTRAVENOUS
  Filled 2012-04-12: qty 200

## 2012-04-12 MED ORDER — DIPHENHYDRAMINE HCL 25 MG PO CAPS
25.0000 mg | ORAL_CAPSULE | ORAL | Status: AC
  Administered 2012-04-12: 25 mg via ORAL
  Filled 2012-04-12: qty 1

## 2012-04-12 NOTE — Progress Notes (Signed)
Notified Dr. Isabel Caprice of pt's c/o of pain 4/10 in right flank area and that pt requesting mild pain medication.  New orders given.  See [facility]

## 2012-04-12 NOTE — Op Note (Signed)
See [facility] OP note scanned into chart. 

## 2012-04-12 NOTE — Progress Notes (Signed)
Pt denies taking ASA, ibuprofen and toradol in last 72hrs.  Pt took laxative yesterday as instructed with results.

## 2012-04-12 NOTE — Interval H&P Note (Signed)
History and Physical Interval Note:  04/12/2012 10:23 AM  [name]  has presented today for surgery, with the diagnosis of Right Renal Calculus  The various methods of treatment have been discussed with the patient and family. After consideration of risks, benefits and other options for treatment, the patient has consented to  Procedure(s) (LRB) with comments: EXTRACORPOREAL SHOCK WAVE LITHOTRIPSY (ESWL) (Right) as a surgical intervention .  The patient's history has been reviewed, patient examined, no change in status, stable for surgery.  I have reviewed the patient's chart and labs.  Questions were answered to the patient's satisfaction.     Mikesha Migliaccio S

## 2012-05-03 ENCOUNTER — Other Ambulatory Visit: Payer: Self-pay | Admitting: Internal Medicine

## 2012-05-03 DIAGNOSIS — C73 Malignant neoplasm of thyroid gland: Secondary | ICD-10-CM

## 2012-05-08 ENCOUNTER — Other Ambulatory Visit: Payer: Self-pay | Admitting: Family

## 2012-05-08 ENCOUNTER — Other Ambulatory Visit: Payer: Self-pay | Admitting: Cardiology

## 2012-05-10 ENCOUNTER — Encounter: Payer: Self-pay | Admitting: Family

## 2012-05-10 ENCOUNTER — Ambulatory Visit (INDEPENDENT_AMBULATORY_CARE_PROVIDER_SITE_OTHER): Payer: No Typology Code available for payment source | Admitting: Family

## 2012-05-10 ENCOUNTER — Telehealth: Payer: Self-pay | Admitting: Family

## 2012-05-10 VITALS — BP 118/70 | HR 82 | Temp 98.0°F | Resp 16 | Ht 64.0 in | Wt 316.0 lb

## 2012-05-10 DIAGNOSIS — I10 Essential (primary) hypertension: Secondary | ICD-10-CM

## 2012-05-10 DIAGNOSIS — N200 Calculus of kidney: Secondary | ICD-10-CM

## 2012-05-10 MED ORDER — POTASSIUM CHLORIDE CRYS ER 20 MEQ PO TBCR: 20.0000 meq | EXTENDED_RELEASE_TABLET | Freq: Every day | ORAL | Status: DC

## 2012-05-10 NOTE — Assessment & Plan Note (Addendum)
BP is stable.  Continue current meds. We discussed potassium supplement, she will start.

## 2012-05-10 NOTE — Assessment & Plan Note (Signed)
Being managed by Urology. She will restart xarelto.  Has follow up with Dr. Jens Som scheduled (cardiology).  Fatigue is likely due to her not sleeping well in setting of pain/kidney stones. She is scheduled to follow up with Urology as well.

## 2012-05-10 NOTE — Telephone Encounter (Signed)
See eRx note dated 05/08/12.

## 2012-05-10 NOTE — Patient Instructions (Addendum)
Please complete blood work in 2 weeks. Start potassium. Follow up in 3 months.

## 2012-05-10 NOTE — Progress Notes (Signed)
Subjective:    Patient ID: [name], female    DOB: 05-04-1956, 56 y.o.   MRN: [id number]  HPI  Ms. Encinas is a 56 yr old female who presents today for follow up.  Hypokalemia- Pt did not start Potassium due to cost. Wants to discuss OTC equivalent.  AF- Only takes Xarelto intermittently due to hematuria  Fatigue-  Waking up 3 times a night due to kidney stones. Reports hx of  neg sleep study x 2.    ,Review of Systems See HPI  Past Medical History  Diagnosis Date  . Allergic rhinitis   . Asthma   . Hypertension   . Hypothyroidism   . Anemia, iron deficiency   . Vitamin D deficiency   . Thyroid cancer   . Morbid obesity   . GERD (gastroesophageal reflux disease)   . Hypoparathyroidism 01/07/2011  . Arthritis   . Fatty liver 01/07/2012  . Headache     occasional sinus headache   . Atrial fibrillation August 2012  . Chronic kidney disease     kidney srones    History   Social History  . Marital Status: Single    Spouse Name: N/A    Number of Children: 0  . Years of Education: N/A   Occupational History  . works in Medical laboratory scientific officer   . [employer]    Social History Main Topics  . Smoking status: Former Smoker -- 10 years    Quit date: 05/27/1995  . Smokeless tobacco: Never Used  . Alcohol Use: Yes     Comment: Occasional - one to three per month  . Drug Use: No  . Sexually Active: Yes -- Female partner(s)   Other Topics Concern  . 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  She was light smoker for 10 years.           Past Surgical History  Procedure Date  . Tonsillectomy and adenoidectomy   . Bunionectomy     bilateral  . Appendectomy   . Gastric bypass   . Thyroidectomy 05/15/2010  . Cyst on ovary removed    . Ureteroscopy 03/03/2012    Procedure: URETEROSCOPY;  Surgeon: Valetta Fuller, MD;  Location: [facility];   Service: Urology;  Laterality: Left;  . Cystoscopy w/ retrogrades 03/03/2012    Procedure: CYSTOSCOPY WITH RETROGRADE PYELOGRAM;  Surgeon: Valetta Fuller, MD;  Location: [facility];  Service: Urology;  Laterality: Bilateral;  . Lithotripsy 03/2012    Family History  Problem Relation Age of Onset  . Hypertension Father   . Diabetes Father   . Lung cancer Father   . Heart attack Father     MI at age 57  . Hypertension Mother   . Hyperthyroidism Mother   . Heart disease Mother   . Heart attack Mother     MI at age 59  . Asthma Brother     Allergies  Allergen Reactions  . Amoxicillin-Pot Clavulanate   . Penicillins     Current Outpatient Prescriptions on File Prior to Visit  Medication Sig Dispense Refill  . albuterol (PROVENTIL) (2.5 MG/3ML) 0.083% nebulizer solution 1 vial 4 times a day as needed  75 mL  1  . Ascorbic Acid (VITAMIN C CR) 1000 MG TBCR Take 1 twice a week      . calcitRIOL (ROCALTROL) 0.5  MCG capsule 3 tablets twice a day      . calcium elemental as carbonate (BARIATRIC TUMS ULTRA) 400 MG tablet Chew 2,000 mg by mouth 2 (two) times daily.       . cetirizine (ZYRTEC) 10 MG tablet Take 10 mg by mouth daily.      . Cyanocobalamin (VITAMIN B 12 PO) Take 1 tablet by mouth every 3 (three) days. 500 mcg once a week      . diltiazem (CARDIZEM CD) 240 MG 24 hr capsule TAKE ONE CAPSULE BY MOUTH EVERY DAY  30 capsule  10  . ergocalciferol (VITAMIN D2) 50000 UNITS capsule Take 50,000 Units by mouth once a week. Wednesday      . fluticasone (FLONASE) 50 MCG/ACT nasal spray USE TWO SPRAYS IN EACH NOSTRIL EVERY DAY  16 g  2  . furosemide (LASIX) 40 MG tablet TAKE ONE TABLET BY MOUTH EVERY DAY  30 tablet  2  . glucosamine-chondroitin 500-400 MG tablet Take 1 tablet by mouth once a week. Sunday      . hydrochlorothiazide (HYDRODIURIL) 25 MG tablet TAKE ONE TABLET BY MOUTH EVERY DAY  30 tablet  2  . HYDROcodone-acetaminophen (NORCO) 10-325 MG per tablet Take 1 tablet by mouth every 6  (six) hours as needed.      . iron polysaccharides (NU-IRON) 150 MG capsule Take 150 mg by mouth every 3 (three) days.       [name] levothyroxine (SYNTHROID, LEVOTHROID) 137 MCG tablet Take 3 pills once a day.      Maxwell Caul Bicarbonate (ZEGERID) 20-1100 MG CAPS Take 1 capsule by mouth daily before breakfast.       . potassium chloride SA (K-DUR,KLOR-CON) 20 MEQ tablet Take 1 tablet (20 mEq total) by mouth daily.  30 tablet  0  . SYMBICORT 160-4.5 MCG/ACT inhaler INHALE TWO PUFFS BY MOUTH TWICE DAILY  11 g  2  . traMADol (ULTRAM) 50 MG tablet Take 1 tablet (50 mg total) by mouth every 6 (six) hours as needed.  30 tablet  0  . valsartan (DIOVAN) 80 MG tablet Take 1 tablet (80 mg total) by mouth daily.  30 tablet  2  . VENTOLIN HFA 108 (90 BASE) MCG/ACT inhaler INHALE TWO PUFFS EVERY 6 HOURS AS NEEDED FOR WHEEZING OR SHORTNESS OF BREATH  18 g  2  . zafirlukast (ACCOLATE) 20 MG tablet Take 20 mg by mouth every morning.        BP 118/70  Pulse 82  Temp 98 F (36.7 C) (Oral)  Resp 16  Ht 5\' 4"  (1.626 m)  Wt 316 lb (143.337 kg)  BMI 54.24 kg/m2  SpO2 97%  LMP 04/02/2012       Objective:   Physical Exam  Constitutional: She is oriented to person, place, and time. She appears well-developed and well-nourished.  Cardiovascular: Normal rate and regular rhythm.   No murmur heard. Neurological: She is alert and oriented to person, place, and time.  Psychiatric: She has a normal mood and affect. Her behavior is normal. Judgment and thought content normal.          Assessment & Plan:

## 2012-05-10 NOTE — Telephone Encounter (Signed)
Refill- hydrochlorothiazide 25mg  tab. Take one tablet by mouth every day. Qty 30 last fill 11.12.13

## 2012-05-11 ENCOUNTER — Ambulatory Visit: Payer: No Typology Code available for payment source | Admitting: Family

## 2012-05-12 ENCOUNTER — Ambulatory Visit
Admission: RE | Admit: 2012-05-12 | Discharge: 2012-05-12 | Disposition: A | Discharge status: Home / Self Care | Payer: No Typology Code available for payment source | Source: Ambulatory Visit | Attending: Internal Medicine | Admitting: Internal Medicine

## 2012-05-12 DIAGNOSIS — C73 Malignant neoplasm of thyroid gland: Secondary | ICD-10-CM

## 2012-05-13 ENCOUNTER — Other Ambulatory Visit: Payer: Self-pay | Admitting: Family

## 2012-05-14 NOTE — Telephone Encounter (Signed)
Received refill request from pharmacy for accolate 1 tablet every morning. Previous refills sent by [place] were for 1 tablet twice a day. Spoke with pt and verified that she is taking medication twice a day. Refill sent, #60 x 3 refills. Change made to medication list.

## 2012-05-26 DIAGNOSIS — C55 Malignant neoplasm of uterus, part unspecified: Secondary | ICD-10-CM

## 2012-05-26 HISTORY — DX: Malignant neoplasm of uterus, part unspecified: C55

## 2012-06-11 ENCOUNTER — Ambulatory Visit (INDEPENDENT_AMBULATORY_CARE_PROVIDER_SITE_OTHER): Payer: No Typology Code available for payment source | Admitting: Family

## 2012-06-11 ENCOUNTER — Telehealth: Payer: Self-pay | Admitting: *Deleted

## 2012-06-11 ENCOUNTER — Telehealth: Payer: Self-pay | Admitting: Family

## 2012-06-11 ENCOUNTER — Encounter: Payer: Self-pay | Admitting: Family

## 2012-06-11 VITALS — BP 110/80 | HR 85 | Temp 98.4°F | Resp 16 | Ht 64.0 in | Wt 315.0 lb

## 2012-06-11 DIAGNOSIS — M25552 Pain in left hip: Secondary | ICD-10-CM

## 2012-06-11 DIAGNOSIS — M25559 Pain in unspecified hip: Secondary | ICD-10-CM

## 2012-06-11 MED ORDER — CYCLOBENZAPRINE HCL 5 MG PO TABS: 5.0000 mg | ORAL_TABLET | Freq: Every evening | ORAL | Status: DC | PRN

## 2012-06-11 MED ORDER — TRAMADOL HCL 50 MG PO TABS: 50.0000 mg | ORAL_TABLET | Freq: Four times a day (QID) | ORAL | Status: DC | PRN

## 2012-06-11 NOTE — Assessment & Plan Note (Signed)
Trial of hs flexeril, tramadol or tylenol prn pain.  If no improvement, consider PT or ortho referral.

## 2012-06-11 NOTE — Telephone Encounter (Signed)
Left detailed message on cell # to call Monday with info.

## 2012-06-11 NOTE — Assessment & Plan Note (Addendum)
I suspect that this is more joint related than sciatica.  Avoid NSAIDS due to her being on xarelto.  Trial of hs flexeril, tramadol or tylenol prn pain.  If no improvement, consider PT or ortho referral.

## 2012-06-11 NOTE — Telephone Encounter (Signed)
Pt left message that she was returning a call from our office. Is there anything you want me to relay to the patient?

## 2012-06-11 NOTE — Progress Notes (Signed)
Subjective:    Patient ID: [name], female    DOB: 09-13-1955, 57 y.o.   MRN: [id number]  HPI  [name] is a 57 yr old female who presents today with chief complaint of left hip pain. Pain started 3-4 months ago.  Tramadol helps, sitting on ice helps.  Heat on the seat of her car helps.  Started to act up.  She is using new orthotics.    She feels like her balance is off due to her new orthotics.      Review of Systems    see HPI  Past Medical History  Diagnosis Date  . Allergic rhinitis   . Asthma   . Hypertension   . Hypothyroidism   . Anemia, iron deficiency   . Vitamin D deficiency   . Thyroid cancer   . Morbid obesity   . GERD (gastroesophageal reflux disease)   . Hypoparathyroidism 01/07/2011  . Arthritis   . Fatty liver 01/07/2012  . Headache     occasional sinus headache   . Atrial fibrillation August 2012  . Chronic kidney disease     kidney srones    History   Social History  . Marital Status: Single    Spouse Name: N/A    Number of Children: 0  . Years of Education: N/A   Occupational History  . works in Medical laboratory scientific officer   . [employer]    Social History Main Topics  . Smoking status: Former Smoker -- 10 years    Quit date: 05/27/1995  . Smokeless tobacco: Never Used  . Alcohol Use: Yes     Comment: Occasional - one to three per month  . Drug Use: No  . Sexually Active: Yes -- Female partner(s)   Other Topics Concern  . 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  She was light smoker for 10 years.           Past Surgical History  Procedure Date  . Tonsillectomy and adenoidectomy   . Bunionectomy     bilateral  . Appendectomy   . Gastric bypass   . Thyroidectomy 05/15/2010  . Cyst on ovary removed    . Ureteroscopy 03/03/2012    Procedure: URETEROSCOPY;  Surgeon: Valetta Fuller, MD;  Location: [facility];   Service: Urology;  Laterality: Left;  . Cystoscopy w/ retrogrades 03/03/2012    Procedure: CYSTOSCOPY WITH RETROGRADE PYELOGRAM;  Surgeon: Valetta Fuller, MD;  Location: [facility];  Service: Urology;  Laterality: Bilateral;  . Lithotripsy 03/2012    Family History  Problem Relation Age of Onset  . Hypertension Father   . Diabetes Father   . Lung cancer Father   . Heart attack Father     MI at age 38  . Hypertension Mother   . Hyperthyroidism Mother   . Heart disease Mother   . Heart attack Mother     MI at age 52  . Asthma Brother     Allergies  Allergen Reactions  . Amoxicillin-Pot Clavulanate   . Penicillins     Current Outpatient Prescriptions on File Prior to Visit  Medication Sig Dispense Refill  . albuterol (PROVENTIL) (2.5 MG/3ML) 0.083% nebulizer solution 1 vial 4 times a day as needed  75 mL  1  . Ascorbic Acid (VITAMIN C CR) 1000 MG TBCR Take 1 twice  a week      . calcitRIOL (ROCALTROL) 0.5 MCG capsule 3 tablets twice a day      . calcium elemental as carbonate (BARIATRIC TUMS ULTRA) 400 MG tablet Chew 2,000 mg by mouth 2 (two) times daily.       . cetirizine (ZYRTEC) 10 MG tablet Take 10 mg by mouth daily.      . Cyanocobalamin (VITAMIN B 12 PO) Take 1 tablet by mouth every 3 (three) days. 500 mcg once a week      . diltiazem (CARDIZEM CD) 240 MG 24 hr capsule TAKE ONE CAPSULE BY MOUTH EVERY DAY  30 capsule  10  . ergocalciferol (VITAMIN D2) 50000 UNITS capsule Take 50,000 Units by mouth once a week. Wednesday      . fluticasone (FLONASE) 50 MCG/ACT nasal spray USE TWO SPRAYS IN EACH NOSTRIL EVERY DAY  16 g  2  . furosemide (LASIX) 40 MG tablet TAKE ONE TABLET BY MOUTH EVERY DAY  30 tablet  2  . glucosamine-chondroitin 500-400 MG tablet Take 1 tablet by mouth once a week. Sunday      . hydrochlorothiazide (HYDRODIURIL) 25 MG tablet TAKE ONE TABLET BY MOUTH EVERY DAY  30 tablet  2  . iron polysaccharides (NU-IRON) 150 MG capsule Take 150 mg by mouth every 3 (three)  days.       [name] levothyroxine (SYNTHROID, LEVOTHROID) 137 MCG tablet Take 3 pills once a day.      Maxwell Caul Bicarbonate (ZEGERID) 20-1100 MG CAPS Take 1 capsule by mouth daily before breakfast.       . potassium chloride SA (K-DUR,KLOR-CON) 20 MEQ tablet Take 1 tablet (20 mEq total) by mouth daily.  30 tablet  0  . SYMBICORT 160-4.5 MCG/ACT inhaler INHALE TWO PUFFS BY MOUTH TWICE DAILY  11 g  2  . traMADol (ULTRAM) 50 MG tablet Take 1 tablet (50 mg total) by mouth every 6 (six) hours as needed.  30 tablet  0  . valsartan (DIOVAN) 80 MG tablet Take 1 tablet (80 mg total) by mouth daily.  30 tablet  2  . VENTOLIN HFA 108 (90 BASE) MCG/ACT inhaler INHALE TWO PUFFS EVERY 6 HOURS AS NEEDED FOR WHEEZING OR SHORTNESS OF BREATH  18 g  2  . zafirlukast (ACCOLATE) 20 MG tablet TAKE ONE TABLET BY MOUTH TWICE DAILY  60 tablet  3    BP 110/80  Pulse 85  Temp 98.4 F (36.9 C) (Oral)  Resp 16  Ht 5\' 4"  (1.626 m)  Wt 315 lb (142.883 kg)  BMI 54.07 kg/m2  SpO2 97%  LMP 06/11/2012    Objective:   Physical Exam  Constitutional: She appears well-developed. No distress.  Musculoskeletal:       Full ROM bilateral hips. 2+ bilateral LE edema.   Psychiatric: She has a normal mood and affect. Her speech is normal and behavior is normal. Thought content normal.          Assessment & Plan:

## 2012-06-11 NOTE — Patient Instructions (Addendum)
Please call if your symptoms worsen or if no improvement in 1 week.  

## 2012-06-11 NOTE — Telephone Encounter (Signed)
I wanted to know if she is back on xarelto?

## 2012-06-14 ENCOUNTER — Encounter: Payer: Self-pay | Admitting: *Deleted

## 2012-06-14 NOTE — Telephone Encounter (Signed)
Sent mychart message

## 2012-06-15 ENCOUNTER — Encounter: Payer: Self-pay | Admitting: Family

## 2012-06-16 NOTE — Telephone Encounter (Signed)
Pt returned my chart message and states she is taking medication again. Medication added to med list.

## 2012-06-26 ENCOUNTER — Other Ambulatory Visit: Payer: Self-pay | Admitting: Family

## 2012-06-30 ENCOUNTER — Encounter: Payer: Self-pay | Admitting: Family

## 2012-06-30 ENCOUNTER — Encounter: Payer: Self-pay | Admitting: *Deleted

## 2012-06-30 NOTE — Telephone Encounter (Signed)
Opened in error

## 2012-07-19 ENCOUNTER — Other Ambulatory Visit: Payer: Self-pay | Admitting: Family

## 2012-07-19 MED ORDER — CYCLOBENZAPRINE HCL 5 MG PO TABS: ORAL_TABLET | ORAL | Status: DC

## 2012-07-19 NOTE — Addendum Note (Signed)
Addended by: Sandford Craze on: 07/19/2012 04:53 PM   Modules accepted: Orders

## 2012-08-10 ENCOUNTER — Other Ambulatory Visit: Payer: Self-pay | Admitting: Family

## 2012-08-10 ENCOUNTER — Ambulatory Visit (INDEPENDENT_AMBULATORY_CARE_PROVIDER_SITE_OTHER): Payer: BC Managed Care – PPO | Admitting: Family

## 2012-08-10 ENCOUNTER — Encounter: Payer: Self-pay | Admitting: Family

## 2012-08-10 VITALS — BP 116/80 | HR 83 | Temp 98.5°F | Resp 18 | Ht 64.0 in | Wt 303.0 lb

## 2012-08-10 DIAGNOSIS — E039 Hypothyroidism, unspecified: Secondary | ICD-10-CM

## 2012-08-10 DIAGNOSIS — M62838 Other muscle spasm: Secondary | ICD-10-CM

## 2012-08-10 DIAGNOSIS — D649 Anemia, unspecified: Secondary | ICD-10-CM

## 2012-08-10 DIAGNOSIS — I10 Essential (primary) hypertension: Secondary | ICD-10-CM

## 2012-08-10 LAB — CBC WITH DIFFERENTIAL/PLATELET
Basophils Absolute: 0 10*3/uL (ref 0.0–0.1)
Eosinophils Relative: 3 % (ref 0–5)
Lymphocytes Relative: 20 % (ref 12–46)
Neutro Abs: 9.3 10*3/uL — ABNORMAL HIGH (ref 1.7–7.7)
Platelets: 239 10*3/uL (ref 150–400)
RDW: 18 % — ABNORMAL HIGH (ref 11.5–15.5)
WBC: 12.8 10*3/uL — ABNORMAL HIGH (ref 4.0–10.5)

## 2012-08-10 LAB — BASIC METABOLIC PANEL
Chloride: 98 mEq/L (ref 96–112)
Creat: 0.99 mg/dL (ref 0.50–1.10)

## 2012-08-10 MED ORDER — HYDROCHLOROTHIAZIDE 25 MG PO TABS: ORAL_TABLET | ORAL | Status: DC

## 2012-08-10 NOTE — Telephone Encounter (Signed)
Cyclobenzaprine request [Last Rx 02.24.14 #20x0] Tramadol request [Last Rx 01.17.14 #30x0]/SLS Please advise.

## 2012-08-10 NOTE — Progress Notes (Signed)
Subjective:    Patient ID: [name], female    DOB: 1955/12/19, 57 y.o.   MRN: [id number]  HPI  [name] is a 57 yr old female who presents today for follow up.   1) HTN- She is currently maintained on lasix, hctz, diovan and cardizem CD.   2) Hypothyroid (hx thyroid CA)- she is followed by Dr. Talmage Coin.  She reports that she was told to lower her tums, synthroid was continued at current dose.   3) Muscle spasms-  Pt reports cramping in muscles.     Review of Systems See HPI  Past Medical History  Diagnosis Date  . Allergic rhinitis   . Asthma   . Hypertension   . Hypothyroidism   . Anemia, iron deficiency   . Vitamin D deficiency   . Thyroid cancer   . Morbid obesity   . GERD (gastroesophageal reflux disease)   . Hypoparathyroidism 01/07/2011  . Arthritis   . Fatty liver 01/07/2012  . Headache     occasional sinus headache   . Atrial fibrillation August 2012  . Chronic kidney disease     kidney srones    History   Social History  . Marital Status: Single    Spouse Name: N/A    Number of Children: 0  . Years of Education: N/A   Occupational History  . works in Medical laboratory scientific officer   . [employer]    Social History Main Topics  . Smoking status: Former Smoker -- 10 years    Quit date: 05/27/1995  . Smokeless tobacco: Never Used  . Alcohol Use: Yes     Comment: Occasional - one to three per month  . Drug Use: No  . Sexually Active: Yes -- Female partner(s)   Other Topics Concern  . 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  She was light smoker for 10 years.                 Past Surgical History  Procedure Laterality Date  . Tonsillectomy and adenoidectomy    . Bunionectomy      bilateral  . Appendectomy    . Gastric bypass    . Thyroidectomy  05/15/2010  . Cyst on ovary removed     . Ureteroscopy   03/03/2012    Procedure: URETEROSCOPY;  Surgeon: Valetta Fuller, MD;  Location: [facility];  Service: Urology;  Laterality: Left;  . Cystoscopy w/ retrogrades  03/03/2012    Procedure: CYSTOSCOPY WITH RETROGRADE PYELOGRAM;  Surgeon: Valetta Fuller, MD;  Location: [facility];  Service: Urology;  Laterality: Bilateral;  . Lithotripsy  03/2012    Family History  Problem Relation Age of Onset  . Hypertension Father   . Diabetes Father   . Lung cancer Father   . Heart attack Father     MI at age 57  . Hypertension Mother   . Hyperthyroidism Mother   . Heart disease Mother   . Heart attack Mother     MI at age 78  . Asthma Brother     Allergies  Allergen Reactions  . Amoxicillin-Pot Clavulanate   . Penicillins     Current Outpatient Prescriptions on File Prior to Visit  Medication Sig Dispense Refill  . albuterol (PROVENTIL) (2.5 MG/3ML) 0.083% nebulizer solution 1 vial 4  times a day as needed  75 mL  1  . Ascorbic Acid (VITAMIN C CR) 1000 MG TBCR Take 1 twice a week      . calcitRIOL (ROCALTROL) 0.5 MCG capsule 3 tablets twice a day      . calcium elemental as carbonate (BARIATRIC TUMS ULTRA) 400 MG tablet Chew 2,000 mg by mouth 2 (two) times daily.       . cetirizine (ZYRTEC) 10 MG tablet Take 10 mg by mouth daily.      . Cyanocobalamin (VITAMIN B 12 PO) Take 1 tablet by mouth every 3 (three) days. 500 mcg once a week      . cyclobenzaprine (FLEXERIL) 5 MG tablet TAKE ONE TABLET BY MOUTH AT BEDTIME AS NEEDED FOR MUSCLE SPASM  20 tablet  0  . diltiazem (CARDIZEM CD) 240 MG 24 hr capsule TAKE ONE CAPSULE BY MOUTH EVERY DAY  30 capsule  10  . DIOVAN 80 MG tablet TAKE ONE TABLET BY MOUTH EVERY DAY  30 tablet  1  . ergocalciferol (VITAMIN D2) 50000 UNITS capsule Take 50,000 Units by mouth once a week. Wednesday      . fluticasone (FLONASE) 50 MCG/ACT nasal spray USE TWO SPRAYS IN EACH NOSTRIL EVERY DAY  16 g  2  . furosemide (LASIX) 40 MG tablet TAKE ONE TABLET BY MOUTH EVERY DAY  30 tablet  1   . glucosamine-chondroitin 500-400 MG tablet Take 1 tablet by mouth once a week. Sunday      . iron polysaccharides (NU-IRON) 150 MG capsule Take 150 mg by mouth every 3 (three) days.       [name] levothyroxine (SYNTHROID, LEVOTHROID) 137 MCG tablet Take 3 pills once a day.      Maxwell Caul Bicarbonate (ZEGERID) 20-1100 MG CAPS Take 1 capsule by mouth daily before breakfast.       . potassium chloride SA (K-DUR,KLOR-CON) 20 MEQ tablet Take 1 tablet (20 mEq total) by mouth daily.  30 tablet  0  . Rivaroxaban (XARELTO) 20 MG TABS Take 20 mg by mouth daily.      . SYMBICORT 160-4.5 MCG/ACT inhaler INHALE TWO PUFFS BY MOUTH TWICE DAILY  11 g  2  . traMADol (ULTRAM) 50 MG tablet Take 1 tablet (50 mg total) by mouth every 6 (six) hours as needed.  30 tablet  0  . VENTOLIN HFA 108 (90 BASE) MCG/ACT inhaler INHALE TWO PUFFS EVERY 6 HOURS AS NEEDED FOR WHEEZING OR SHORTNESS OF BREATH  18 g  2  . zafirlukast (ACCOLATE) 20 MG tablet TAKE ONE TABLET BY MOUTH TWICE DAILY  60 tablet  3   No current facility-administered medications on file prior to visit.    BP 116/80  Pulse 83  Temp(Src) 98.5 F (36.9 C) (Oral)  Resp 18  Ht 5\' 4"  (1.626 m)  Wt 303 lb (137.44 kg)  BMI 51.98 kg/m2  SpO2 99%  LMP 06/11/2012       Objective:   Physical Exam  Constitutional: She is oriented to person, place, and time.  Morbidly obese white female.NAD.  HENT:  Head: Normocephalic and atraumatic.  Cardiovascular: Normal rate and regular rhythm.   Pulmonary/Chest: Effort normal and breath sounds normal. No respiratory distress. She has no wheezes.  Musculoskeletal: She exhibits no edema.  Neurological: She is alert and oriented to person, place, and time.  Skin: Skin is warm and dry.  Psychiatric: She has a normal mood and affect. Her behavior is normal.  Assessment & Plan:

## 2012-08-10 NOTE — Patient Instructions (Addendum)
Please complete your lab work prior to leaving. Follow up in 3 months.   

## 2012-08-12 DIAGNOSIS — R252 Cramp and spasm: Secondary | ICD-10-CM | POA: Insufficient documentation

## 2012-08-12 NOTE — Assessment & Plan Note (Signed)
Clinically stable. Management per endo.  

## 2012-08-12 NOTE — Assessment & Plan Note (Signed)
BP Readings from Last 3 Encounters:  08/10/12 116/80  06/11/12 110/80  05/10/12 118/70  bp remains well controlled. Cont current meds.obtain bmet.

## 2012-08-12 NOTE — Assessment & Plan Note (Signed)
Obtain bmet 

## 2012-08-17 ENCOUNTER — Other Ambulatory Visit: Payer: Self-pay | Admitting: Urology

## 2012-08-18 NOTE — Progress Notes (Signed)
CBC with diff, BMET 08/10/12, EPIC,  LOV with EKG 6/13 EPIC  Dr Jens Som

## 2012-08-18 NOTE — Patient Instructions (Addendum)
20 GABRELLA STROH  08/18/2012   Your procedure is scheduled on:  08/25/12  [facility]  Report to [facility] at    0630   AM.  Call this number if you have problems the morning of surgery: [phone]       Remember: Janan Halter WITH YOU TO HOSPITAL   Do not eat food  Or drink :After Midnight. Tuesday NIGHT   Take these medicines the morning of surgery with A SIP OF WATER:    Zyrtec, Synthroid, Zegrid    Symbicort                                  IF NEEDED   Norco, Flexaril, Albuterol   .  Contacts, dentures or partial plates can not be worn to surgery  Leave suitcase in the car. After surgery it may be brought to your room.  For patients admitted to the hospital, checkout time is 11:00 AM day of  discharge.             SPECIAL INSTRUCTIONS- SEE [facility] PREPARING FOR SURGERY INSTRUCTION SHEET-     DO NOT WEAR JEWELRY, LOTIONS, POWDERS, OR PERFUMES.  WOMEN-- DO NOT SHAVE LEGS OR UNDERARMS FOR 12 HOURS BEFORE SHOWERS. MEN MAY SHAVE FACE.  Patients discharged the day of surgery will not be allowed to drive home. IF going home the day of surgery, you must have a driver and someone to stay with you for the first 24 hours  Name and phone number of your driver:brother                                                                        Please read over the following fact sheets that you were given: MRSA Information, Incentive Spirometry Sheet, Blood Transfusion Sheet  Information                                                                                   Melo Stauber  PST [phone]  [phone]                 FAILURE TO FOLLOW THESE INSTRUCTIONS MAY RESULT IN  CANCELLATION   OF YOUR SURGERY                                                  Patient Signature _____________________________

## 2012-08-19 ENCOUNTER — Encounter (HOSPITAL_COMMUNITY): Payer: Self-pay

## 2012-08-19 ENCOUNTER — Ambulatory Visit (HOSPITAL_COMMUNITY)
Admission: RE | Admit: 2012-08-19 | Discharge: 2012-08-19 | Disposition: A | Discharge status: Home / Self Care | Payer: BC Managed Care – PPO | Source: Ambulatory Visit | Attending: Urology | Admitting: Urology

## 2012-08-19 ENCOUNTER — Encounter (HOSPITAL_COMMUNITY): Payer: Self-pay | Admitting: Pharmacy Technician

## 2012-08-19 ENCOUNTER — Encounter (HOSPITAL_COMMUNITY)
Admission: RE | Admit: 2012-08-19 | Discharge: 2012-08-19 | Disposition: A | Discharge status: Home / Self Care | Payer: BC Managed Care – PPO | Source: Ambulatory Visit | Attending: Urology | Admitting: Urology

## 2012-08-19 DIAGNOSIS — N201 Calculus of ureter: Secondary | ICD-10-CM | POA: Insufficient documentation

## 2012-08-19 DIAGNOSIS — I4891 Unspecified atrial fibrillation: Secondary | ICD-10-CM | POA: Insufficient documentation

## 2012-08-19 DIAGNOSIS — I10 Essential (primary) hypertension: Secondary | ICD-10-CM | POA: Insufficient documentation

## 2012-08-19 DIAGNOSIS — Z87891 Personal history of nicotine dependence: Secondary | ICD-10-CM | POA: Insufficient documentation

## 2012-08-19 DIAGNOSIS — Z01812 Encounter for preprocedural laboratory examination: Secondary | ICD-10-CM | POA: Insufficient documentation

## 2012-08-19 HISTORY — DX: Other specified postprocedural states: Z98.890

## 2012-08-19 HISTORY — DX: Personal history of other medical treatment: Z92.89

## 2012-08-19 HISTORY — DX: Other specified postprocedural states: R11.2

## 2012-08-19 LAB — SURGICAL PCR SCREEN: Staphylococcus aureus: NEGATIVE

## 2012-08-19 LAB — CBC
HCT: 38.4 % (ref 36.0–46.0)
Hemoglobin: 12 g/dL (ref 12.0–15.0)
RBC: 5.08 MIL/uL (ref 3.87–5.11)
WBC: 9.3 10*3/uL (ref 4.0–10.5)

## 2012-08-24 NOTE — Anesthesia Preprocedure Evaluation (Addendum)
Anesthesia Evaluation  Patient identified by MRN, date of birth, ID band Patient awake    Reviewed: Allergy & Precautions, H&P , NPO status , Patient's Chart, lab work & pertinent test results  History of Anesthesia Complications (+) PONV  Airway Mallampati: II TM Distance: >3 FB Neck ROM: full    Dental no notable dental hx. (+) Teeth Intact and Dental Advisory Given   Pulmonary asthma ,  breath sounds clear to auscultation  Pulmonary exam normal       Cardiovascular hypertension, Pt. on medications + dysrhythmias Atrial Fibrillation Rhythm:regular Rate:Normal  AF 8/12.   Neuro/Psych negative neurological ROS  negative psych ROS   GI/Hepatic negative GI ROS, Neg liver ROS, GERD-  Medicated and Controlled,  Endo/Other  negative endocrine ROSHypothyroidism Morbid obesityThyroid Ca.  hypoparathyroidism  Renal/GU negative Renal ROS  negative genitourinary   Musculoskeletal   Abdominal (+) + obese,   Peds  Hematology negative hematology ROS (+)   Anesthesia Other Findings   Reproductive/Obstetrics negative OB ROS                         Anesthesia Physical Anesthesia Plan  ASA: III  Anesthesia Plan: General   Post-op Pain Management:    Induction: Intravenous  Airway Management Planned: LMA  Additional Equipment:   Intra-op Plan:   Post-operative Plan:   Informed Consent: I have reviewed the patients History and Physical, chart, labs and discussed the procedure including the risks, benefits and alternatives for the proposed anesthesia with the patient or authorized representative who has indicated his/her understanding and acceptance.   Dental Advisory Given  Plan Discussed with: CRNA and Surgeon  Anesthesia Plan Comments:         Anesthesia Quick Evaluation

## 2012-08-25 ENCOUNTER — Ambulatory Visit (HOSPITAL_COMMUNITY): Payer: BC Managed Care – PPO

## 2012-08-25 ENCOUNTER — Encounter (HOSPITAL_COMMUNITY): Payer: Self-pay | Admitting: *Deleted

## 2012-08-25 ENCOUNTER — Ambulatory Visit (HOSPITAL_COMMUNITY)
Admission: RE | Admit: 2012-08-25 | Discharge: 2012-08-25 | Disposition: A | Discharge status: Home / Self Care | Payer: BC Managed Care – PPO | Source: Ambulatory Visit | Attending: Urology | Admitting: Urology

## 2012-08-25 ENCOUNTER — Encounter (HOSPITAL_COMMUNITY)
Admission: RE | Disposition: A | Discharge status: Home / Self Care | Payer: Self-pay | Source: Ambulatory Visit | Attending: Urology

## 2012-08-25 ENCOUNTER — Encounter (HOSPITAL_COMMUNITY): Payer: Self-pay | Admitting: Anesthesiology

## 2012-08-25 ENCOUNTER — Ambulatory Visit (HOSPITAL_COMMUNITY): Payer: BC Managed Care – PPO | Admitting: Anesthesiology

## 2012-08-25 DIAGNOSIS — E039 Hypothyroidism, unspecified: Secondary | ICD-10-CM | POA: Insufficient documentation

## 2012-08-25 DIAGNOSIS — Z7901 Long term (current) use of anticoagulants: Secondary | ICD-10-CM | POA: Insufficient documentation

## 2012-08-25 DIAGNOSIS — Z85850 Personal history of malignant neoplasm of thyroid: Secondary | ICD-10-CM | POA: Insufficient documentation

## 2012-08-25 DIAGNOSIS — Z880 Allergy status to penicillin: Secondary | ICD-10-CM | POA: Insufficient documentation

## 2012-08-25 DIAGNOSIS — J45909 Unspecified asthma, uncomplicated: Secondary | ICD-10-CM | POA: Insufficient documentation

## 2012-08-25 DIAGNOSIS — E559 Vitamin D deficiency, unspecified: Secondary | ICD-10-CM | POA: Insufficient documentation

## 2012-08-25 DIAGNOSIS — I4891 Unspecified atrial fibrillation: Secondary | ICD-10-CM | POA: Insufficient documentation

## 2012-08-25 DIAGNOSIS — Z888 Allergy status to other drugs, medicaments and biological substances status: Secondary | ICD-10-CM | POA: Insufficient documentation

## 2012-08-25 DIAGNOSIS — K219 Gastro-esophageal reflux disease without esophagitis: Secondary | ICD-10-CM | POA: Insufficient documentation

## 2012-08-25 DIAGNOSIS — D509 Iron deficiency anemia, unspecified: Secondary | ICD-10-CM | POA: Insufficient documentation

## 2012-08-25 DIAGNOSIS — I10 Essential (primary) hypertension: Secondary | ICD-10-CM | POA: Insufficient documentation

## 2012-08-25 DIAGNOSIS — N200 Calculus of kidney: Secondary | ICD-10-CM | POA: Insufficient documentation

## 2012-08-25 DIAGNOSIS — Z87891 Personal history of nicotine dependence: Secondary | ICD-10-CM | POA: Insufficient documentation

## 2012-08-25 DIAGNOSIS — Z79899 Other long term (current) drug therapy: Secondary | ICD-10-CM | POA: Insufficient documentation

## 2012-08-25 HISTORY — PX: CYSTOSCOPY WITH RETROGRADE PYELOGRAM, URETEROSCOPY AND STENT PLACEMENT: SHX5789

## 2012-08-25 HISTORY — PX: HOLMIUM LASER APPLICATION: SHX5852

## 2012-08-25 SURGERY — CYSTOURETEROSCOPY, WITH RETROGRADE PYELOGRAM AND STENT INSERTION
Anesthesia: General | Laterality: Left | Wound class: Clean Contaminated

## 2012-08-25 MED ORDER — ONDANSETRON HCL 4 MG/2ML IJ SOLN
INTRAMUSCULAR | Status: DC | PRN
  Administered 2012-08-25: 4 mg via INTRAVENOUS

## 2012-08-25 MED ORDER — LACTATED RINGERS IV SOLN
INTRAVENOUS | Status: DC
  Administered 2012-08-25: 10:00:00 via INTRAVENOUS

## 2012-08-25 MED ORDER — FENTANYL CITRATE 0.05 MG/ML IJ SOLN
INTRAMUSCULAR | Status: AC
  Filled 2012-08-25: qty 2

## 2012-08-25 MED ORDER — CIPROFLOXACIN IN D5W 400 MG/200ML IV SOLN
INTRAVENOUS | Status: AC
  Filled 2012-08-25: qty 200

## 2012-08-25 MED ORDER — IOHEXOL 300 MG/ML  SOLN
INTRAMUSCULAR | Status: AC
  Filled 2012-08-25: qty 1

## 2012-08-25 MED ORDER — IOHEXOL 300 MG/ML  SOLN
INTRAMUSCULAR | Status: DC | PRN
  Administered 2012-08-25: 8 mL via INTRAVENOUS

## 2012-08-25 MED ORDER — FENTANYL CITRATE 0.05 MG/ML IJ SOLN
25.0000 ug | INTRAMUSCULAR | Status: DC | PRN
  Administered 2012-08-25 (×3): 50 ug via INTRAVENOUS

## 2012-08-25 MED ORDER — MIDAZOLAM HCL 5 MG/5ML IJ SOLN
INTRAMUSCULAR | Status: DC | PRN
  Administered 2012-08-25: 2 mg via INTRAVENOUS

## 2012-08-25 MED ORDER — LIDOCAINE HCL 1 % IJ SOLN
INTRAMUSCULAR | Status: DC | PRN
  Administered 2012-08-25: 80 mg via INTRADERMAL

## 2012-08-25 MED ORDER — PROPOFOL 10 MG/ML IV EMUL
INTRAVENOUS | Status: DC | PRN
  Administered 2012-08-25: 200 mg via INTRAVENOUS

## 2012-08-25 MED ORDER — CIPROFLOXACIN IN D5W 400 MG/200ML IV SOLN
400.0000 mg | INTRAVENOUS | Status: AC
  Administered 2012-08-25: 400 mg via INTRAVENOUS

## 2012-08-25 MED ORDER — LACTATED RINGERS IV SOLN
INTRAVENOUS | Status: DC | PRN
  Administered 2012-08-25: 08:00:00 via INTRAVENOUS

## 2012-08-25 MED ORDER — ACETAMINOPHEN 10 MG/ML IV SOLN
INTRAVENOUS | Status: AC
  Filled 2012-08-25: qty 100

## 2012-08-25 MED ORDER — KETOROLAC TROMETHAMINE 30 MG/ML IJ SOLN
INTRAMUSCULAR | Status: DC | PRN
  Administered 2012-08-25: 30 mg via INTRAVENOUS

## 2012-08-25 MED ORDER — LIDOCAINE HCL 2 % EX GEL
CUTANEOUS | Status: AC
  Filled 2012-08-25: qty 10

## 2012-08-25 MED ORDER — ACETAMINOPHEN 10 MG/ML IV SOLN
INTRAVENOUS | Status: DC | PRN
  Administered 2012-08-25: 1000 mg via INTRAVENOUS

## 2012-08-25 MED ORDER — FENTANYL CITRATE 0.05 MG/ML IJ SOLN
INTRAMUSCULAR | Status: DC | PRN
  Administered 2012-08-25: 50 ug via INTRAVENOUS
  Administered 2012-08-25: 25 ug via INTRAVENOUS
  Administered 2012-08-25: 50 ug via INTRAVENOUS

## 2012-08-25 SURGICAL SUPPLY — 45 items
ADAPTER CATH URET PLST 4-6FR (CATHETERS) IMPLANT
ADPR CATH URET STRL DISP 4-6FR (CATHETERS)
BAG URINE DRAINAGE (UROLOGICAL SUPPLIES) ×2 IMPLANT
BAG URO CATCHER STRL LF (DRAPE) ×2 IMPLANT
BASKET LASER NITINOL 1.9FR (BASKET) IMPLANT
BASKET STNLS GEMINI 4WIRE 3FR (BASKET) IMPLANT
BASKET ZERO TIP NITINOL 2.4FR (BASKET) ×1 IMPLANT
BRUSH URET BIOPSY 3F (UROLOGICAL SUPPLIES) IMPLANT
BSKT STON RTRVL 120 1.9FR (BASKET)
BSKT STON RTRVL GEM 120X11 3FR (BASKET)
BSKT STON RTRVL ZERO TP 2.4FR (BASKET) ×1
CANISTER SUCT LVC 12 LTR MEDI- (MISCELLANEOUS) IMPLANT
CATH CLEAR GEL 3F BACKSTOP (CATHETERS) IMPLANT
CATH INTERMIT  6FR 70CM (CATHETERS) IMPLANT
CATH URET 5FR 28IN CONE TIP (BALLOONS)
CATH URET 5FR 28IN OPEN ENDED (CATHETERS) ×2 IMPLANT
CATH URET 5FR 70CM CONE TIP (BALLOONS) IMPLANT
CATH URET DUAL LUMEN 6-10FR 50 (CATHETERS) IMPLANT
CLOTH BEACON ORANGE TIMEOUT ST (SAFETY) ×2 IMPLANT
DRAPE CAMERA CLOSED 9X96 (DRAPES) ×2 IMPLANT
ELECT REM PT RETURN 9FT ADLT (ELECTROSURGICAL)
ELECTRODE REM PT RTRN 9FT ADLT (ELECTROSURGICAL) IMPLANT
GLOVE BIOGEL PI IND STRL 7.5 (GLOVE) ×1 IMPLANT
GLOVE BIOGEL PI INDICATOR 7.5 (GLOVE) ×1
GLOVE ECLIPSE 7.0 STRL STRAW (GLOVE) ×2 IMPLANT
GLOVE SURG SS PI 8.0 STRL IVOR (GLOVE) ×2 IMPLANT
GOWN PREVENTION PLUS LG XLONG (DISPOSABLE) ×2 IMPLANT
GOWN PREVENTION PLUS XLARGE (GOWN DISPOSABLE) ×2 IMPLANT
GOWN STRL REIN XL XLG (GOWN DISPOSABLE) ×2 IMPLANT
GUIDEWIRE .038 (WIRE) IMPLANT
GUIDEWIRE ANG ZIPWIRE 038X150 (WIRE) IMPLANT
GUIDEWIRE STR DUAL SENSOR (WIRE) ×2 IMPLANT
IV NS IRRIG 3000ML ARTHROMATIC (IV SOLUTION) ×2 IMPLANT
KIT BALLIN UROMAX 15FX10 (LABEL) IMPLANT
KIT BALLN UROMAX 15FX4 (MISCELLANEOUS) IMPLANT
KIT BALLN UROMAX 26 75X4 (MISCELLANEOUS)
LASER FIBER DISP (UROLOGICAL SUPPLIES) ×1 IMPLANT
MANIFOLD NEPTUNE II (INSTRUMENTS) ×2 IMPLANT
MARKER SKIN DUAL TIP RULER LAB (MISCELLANEOUS) ×2 IMPLANT
PACK CYSTO (CUSTOM PROCEDURE TRAY) ×2 IMPLANT
SET HIGH PRES BAL DIL (LABEL)
SHEATH URET ACCESS 12FR/35CM (UROLOGICAL SUPPLIES) IMPLANT
SHEATH URET ACCESS 12FR/55CM (UROLOGICAL SUPPLIES) IMPLANT
SYRINGE IRR TOOMEY STRL 70CC (SYRINGE) IMPLANT
TUBING CONNECTING 10 (TUBING) ×2 IMPLANT

## 2012-08-25 NOTE — H&P (Signed)
History of Present Illness      Ms. Long today returns for presumptive routine follow up. Again her main problem has been significant issues with nephrolithiasis. The patient was known to have bilateral renal stone burden and had a ureteral calculus which required ureteroscopy with holmium laser lithotripsy in the fall of 2013. The patient subsequently underwent ESWL for a right renal calculus in the renal pelvis. She did well with those treatments. Stone analysis is revealed calcium oxalate stone disease. She is known to have some additional stones. She currently is not having any significant flank pain. The patient had been on anticoagulation for atrial fibrillation but was stopped as she developed hematuria. She is attempted to restart anticoagulation she has had recurrent gross hematuria and that is currently being held by her primary care physician. Again she is not having any flank or abdominal discomfort at this time.  I went ahead and obtained a KUB. This does show some bilateral renal calculi with the most significant stone burden being in the lower pole of both kidneys. Was also quite concerned about potential calcification in the area of the left mid ureter. That reason I have ordered a noncontrast stone protocol CT.   Past Medical History Problems  1. History of  Allergic Rhinitis 477.9 2. History of  Arthritis V13.4 3. History of  Asthma 493.90 4. History of  Atrial Fibrillation 427.31 5. History of  Chronic Reflux Esophagitis 530.11 6. Former Smoker V15.82 7. History of  Hematuria 599.70 8. History of  Hypertension 401.9 9. History of  Hypoparathyroidism 252.1 10. History of  Hypothyroidism 244.9 11. History of  Iron Deficiency Anemia Secondary To Chronic Blood Loss 280.0 12. History of  Lower Back Pain 724.2 13. History of  Morbid Obesity 278.01 14. History of  Thyroid Cancer V10.87 15. History of  Vitamin D Deficiency 268.9  Surgical History Problems  1. History of   Appendectomy 2. History of  Bunion Correction With Metatarsal Osteotomy [place] Procedure 3. History of  Cystoscopy With Insertion Of Ureteral Stent Left 4. History of  Cystoscopy With Ureteroscopy With Lithotripsy 5. History of  High Gastric Bypass 6. History of  Lithotripsy 7. History of  Thyroid Surgery 8. History of  Tonsillectomy With Adenoidectomy  Current Meds 1. Albuterol Sulfate (2.5 MG/3ML) 0.083% Inhalation Nebulization Solution; Therapy:  (Recorded:14Aug2013) to 2. Diovan 80 MG Oral Tablet; Therapy: (Recorded:25Mar2014) to 3. Hydrochlorothiazide 25 MG Oral Tablet; Therapy: (Recorded:25Mar2014) to  Allergies Medication  1. Allergen SOLN 2. Amoxicillin CAPS 3. Penicillins  Family History Problems  1. Family history of  No Significant Family History  Social History Problems  1. Alcohol Use  Review of Systems  Genitourinary: hematuria.  Gastrointestinal: abdominal pain.  Musculoskeletal: back pain.  Neurological: headache.    Vitals Vital Signs [Data Includes: Last 1 Day]  25Mar2014 10:40AM  Blood Pressure: 138 / 88 Temperature: 98.9 F Heart Rate: 88 Well-developed well-nourished female in no acute distress. Respiratory: Normal effort Cardiac: Regular rate and rhythm Abdomen: Soft nontender no CVA tenderness Extremities: No edema/tenderness  Results/Data Urine [Data Includes: Last 1 Day]   25Mar2014 COLOR YELLOW  APPEARANCE CLEAR  SPECIFIC GRAVITY 1.015  pH 6.0  GLUCOSE NEG mg/dL BILIRUBIN NEG  KETONE NEG mg/dL BLOOD MOD  PROTEIN NEG mg/dL UROBILINOGEN 0.2 mg/dL NITRITE NEG  LEUKOCYTE ESTERASE SMALL  SQUAMOUS EPITHELIAL/HPF FEW  WBC 3-6 WBC/hpf RBC 7-10 RBC/hpf BACTERIA FEW  CRYSTALS NONE SEEN  CASTS NONE SEEN   Assessment Assessed  1. Urinary Calculus On The  Left 592.9 2. Nephrolithiasis 592.0 3. Gross Hematuria 599.71  Plan  Gross Hematuria (599.71)  1. AU CT-STONE PROTOCOL  Done: 25Mar2014 12:00AM Health Maintenance (V70.0)  2.  UA With REFLEX  Done: 25Mar2014 10:11AM Urinary Calculus On The Left (592.9)  3. Follow-up Schedule Surgery Office  Follow-up  Done: 25Mar2014  URINE CULTURE  Status: In Progress - Specimen/Data Collected  Done: 25Mar2014 Ordered Today; For: Gross Hematuria (599.71); Ordered By: Everlena Cooper  Due: 27Mar2014 Marked Important; Last Updated By: Thomasenia Sales   Discussion/Summary   Clinically, Meribeth is doing fairly well, but again, she tells me that when she has tried to be restarted on her anticoagulation, she has had recurrent hematuria. She has had no obvious problems with recurrent abdominal or flank pain, but on KUB, again, there was a very suspicious 7 mm to 8 mm calcification in the area of the left mid ureter. Stone protocol CT appears to confirm this. There does appear to be at least some obstruction of her left kidney. She does have additional stone burden bilaterally, left greater than right. Most of her stone burden is in the lower poles. The official report remains pending, but it is certainly fairly obvious to me that she has a ureteral calculus today. She is not interested in trying to pass the stone spontaneously. She is a candidate for ESWL or ureteroscopy. I do think that we will have a better chance at definitively managing this with ureteroscopy and we will plan on getting her on the schedule for next available opening at her request. She does appear to understand the procedure, risks, and benefits, as she has had this in the past.

## 2012-08-25 NOTE — Preoperative (Signed)
Beta Blockers   Reason not to administer Beta Blockers:Not Applicable 

## 2012-08-25 NOTE — Progress Notes (Signed)
Pt up in room and ambulated to bathroom with standby assist without success of urinating.  Encouraged pt to drink more water.  Pt did have an episode of nausea prior to ambulation but since has past and denies any nausea.

## 2012-08-25 NOTE — Op Note (Signed)
Preoperative diagnosis: left ureteral calculus  Postoperative diagnosis: left ureteral calculus  Procedure:  1. Cystoscopy 2. left ureteroscopy and stone removal 3. Ureteroscopic laser lithotripsy 4. left retrograde pyelography with interpretation  Surgeon: Valetta Fuller, MD  Anesthesia: General  Complications: None  Intraoperative findings:left retrograde pyelography demonstrated a filling defect within the left ureter consistent with the patient's known calculus without other abnormalities.  EBL: Minimal  Specimens: 1. left ureteral calculus  Disposition of specimens: [facility] for stone analysis  Indication: [name] is a 57 y.o.   patient with urolithiasis. After reviewing the management options for treatment, the patient elected to proceed with the above surgical procedure(s). We have discussed the potential benefits and risks of the procedure, side effects of the proposed treatment, the likelihood of the patient achieving the goals of the procedure, and any potential problems that might occur during the procedure or recuperation. Informed consent has been obtained.  Description of procedure:  The patient was taken to the operating room and general anesthesia was induced.  The patient was placed in the dorsal lithotomy position, prepped and draped in the usual sterile fashion, and preoperative antibiotics were administered. A preoperative time-out was performed.   Cystourethroscopy was performed.  The patient's urethra was examined and was normal. The bladder was then systematically examined in its entirety. There was no evidence for any bladder tumors, stones, or other mucosal pathology.    Attention then turned to the left ureteral orifice and a ureteral catheter was used to intubate the ureteral orifice.  Omnipaque contrast was injected through the ureteral catheter and a retrograde pyelogram was performed with findings as dictated above.  A  0.38 sensor guidewire was then advanced up the left ureter into the renal pelvis under fluoroscopic guidance. The 6 Fr semirigid ureteroscope was then advanced into the ureter next to the guidewire and the calculus was identified.   The stone was then fragmented with the 365 micron holmium laser fiber on a setting of 0.8J and frequency of 8 Hz.   All stones were then removed from the ureter with a zero tip nitinol basket.  Reinspection of the ureter revealed no remaining visible stones or fragments.   The bladder was then emptied and the procedure ended.  The patient appeared to tolerate the procedure well and without complications.  The patient was able to be awakened and transferred to the recovery unit in satisfactory condition.

## 2012-08-25 NOTE — Transfer of Care (Signed)
Immediate Anesthesia Transfer of Care Note  Patient: [name]  Procedure(s) Performed: Procedure(s): CYSTOSCOPY WITH RETROGRADE PYELOGRAM, URETEROSCOPY (Left) HOLMIUM LASER APPLICATION (Left)  Patient Location: PACU  Anesthesia Type:General  Level of Consciousness: awake, alert , oriented and patient cooperative  Airway & Oxygen Therapy: Patient Spontanous Breathing and Patient connected to face mask oxygen  Post-op Assessment: Report given to PACU RN, Post -op Vital signs reviewed and stable and Patient moving all extremities X 4  Post vital signs: Reviewed and stable  Complications: No apparent anesthesia complications

## 2012-08-25 NOTE — Anesthesia Postprocedure Evaluation (Signed)
  Anesthesia Post-op Note  Patient: [name]  Procedure(s) Performed: Procedure(s) (LRB): CYSTOSCOPY WITH RETROGRADE PYELOGRAM, URETEROSCOPY (Left) HOLMIUM LASER APPLICATION (Left)  Patient Location: PACU  Anesthesia Type: General  Level of Consciousness: awake and alert   Airway and Oxygen Therapy: Patient Spontanous Breathing  Post-op Pain: mild  Post-op Assessment: Post-op Vital signs reviewed, Patient's Cardiovascular Status Stable, Respiratory Function Stable, Patent Airway and No signs of Nausea or vomiting  Last Vitals:  Filed Vitals:   08/25/12 0930  BP: 140/67  Pulse: 66  Temp:   Resp: 14    Post-op Vital Signs: stable   Complications: No apparent anesthesia complications

## 2012-08-27 ENCOUNTER — Encounter (HOSPITAL_COMMUNITY): Payer: Self-pay | Admitting: Urology

## 2012-08-27 ENCOUNTER — Other Ambulatory Visit: Payer: Self-pay | Admitting: Family Medicine

## 2012-09-07 ENCOUNTER — Other Ambulatory Visit: Payer: Self-pay | Admitting: Family Medicine

## 2012-09-07 ENCOUNTER — Other Ambulatory Visit: Payer: Self-pay | Admitting: Family

## 2012-09-07 NOTE — Telephone Encounter (Signed)
Cyclobenzaprine request [Last Rx 03.18.14 #20x0; d/c by provider 3.27.14]/SLS Please advise.

## 2012-09-13 ENCOUNTER — Encounter: Payer: Self-pay | Admitting: Family Medicine

## 2012-09-13 ENCOUNTER — Ambulatory Visit (INDEPENDENT_AMBULATORY_CARE_PROVIDER_SITE_OTHER): Payer: BC Managed Care – PPO | Admitting: Family Medicine

## 2012-09-13 VITALS — BP 147/88 | HR 105 | Temp 98.3°F | Resp 16 | Wt 279.5 lb

## 2012-09-13 DIAGNOSIS — M7062 Trochanteric bursitis, left hip: Secondary | ICD-10-CM

## 2012-09-13 DIAGNOSIS — M76899 Other specified enthesopathies of unspecified lower limb, excluding foot: Secondary | ICD-10-CM

## 2012-09-13 DIAGNOSIS — M7918 Myalgia, other site: Secondary | ICD-10-CM | POA: Insufficient documentation

## 2012-09-13 DIAGNOSIS — IMO0001 Reserved for inherently not codable concepts without codable children: Secondary | ICD-10-CM

## 2012-09-13 MED ORDER — HYDROCODONE-ACETAMINOPHEN 7.5-325 MG PO TABS: ORAL_TABLET | ORAL | Status: DC

## 2012-09-13 NOTE — Patient Instructions (Signed)
Do your home stretching exercises. Apply anti-inflammatory compounded cream as directed.

## 2012-09-13 NOTE — Progress Notes (Signed)
OFFICE NOTE  09/13/2012  CC:  Chief Complaint  Patient presents with  . Hip Pain    Pt c/o left hip pain x2 wks     HPI: Patient is a 57 y.o. Caucasian female who is here for left hip pain.  Describes a couple of months of gradually worsening left hip pain. It catches and releases after a few steps.  No hx of trauma.  She points at left lower gluteal region as the primary site.  Also occasionally upper glut region and anterior left hip region.  No lateral hip pain.  Lifting the left leg hurts when she is sitting but wt bearing/ambulating makes it much worse.  Ice helps sometimes.  Tramadol and cyclobenzaprine have been helpful.   Pertinent PMH:  Past Medical History  Diagnosis Date  . Allergic rhinitis   . Asthma   . Hypertension   . Hypothyroidism   . Anemia, iron deficiency   . Vitamin D deficiency   . Thyroid cancer   . Morbid obesity   . GERD (gastroesophageal reflux disease)   . Hypoparathyroidism 01/07/2011  . Arthritis   . Fatty liver 01/07/2012  . Headache     occasional sinus headache   . Atrial fibrillation August 2012  . Chronic kidney disease     kidney srones  . PONV (postoperative nausea and vomiting)   . History of blood transfusion    Past Surgical History  Procedure Laterality Date  . Tonsillectomy and adenoidectomy    . Bunionectomy      bilateral  . Appendectomy    . Gastric bypass    . Thyroidectomy  05/15/2010  . Cyst on ovary removed     . Ureteroscopy  03/03/2012    Procedure: URETEROSCOPY;  Surgeon: Valetta Fuller, MD;  Location: [facility];  Service: Urology;  Laterality: Left;  . Cystoscopy w/ retrogrades  03/03/2012    Procedure: CYSTOSCOPY WITH RETROGRADE PYELOGRAM;  Surgeon: Valetta Fuller, MD;  Location: [facility];  Service: Urology;  Laterality: Bilateral;  . Lithotripsy  03/2012  . Cystoscopy with retrograde pyelogram, ureteroscopy and stent placement Left 08/25/2012    Procedure: CYSTOSCOPY WITH RETROGRADE PYELOGRAM, URETEROSCOPY;  Surgeon:  Valetta Fuller, MD;  Location: [facility];  Service: Urology;  Laterality: Left;  . Holmium laser application Left 08/25/2012    Procedure: HOLMIUM LASER APPLICATION;  Surgeon: Valetta Fuller, MD;  Location: [facility];  Service: Urology;  Laterality: Left;    MEDS:  Outpatient Prescriptions Prior to Visit  Medication Sig Dispense Refill  . albuterol (PROVENTIL HFA;VENTOLIN HFA) 108 (90 BASE) MCG/ACT inhaler Inhale 2 puffs into the lungs every 6 (six) hours as needed for wheezing.      [name] albuterol (PROVENTIL) (2.5 MG/3ML) 0.083% nebulizer solution Take 2.5 mg by nebulization every 4 (four) hours as needed for wheezing.      . Ascorbic Acid (VITAMIN C CR) 1000 MG TBCR Take 1 twice a week      . budesonide-formoterol (SYMBICORT) 160-4.5 MCG/ACT inhaler Inhale 2 puffs into the lungs 2 (two) times daily.      . calcitRIOL (ROCALTROL) 0.5 MCG capsule Take 1.5 mcg by mouth 2 (two) times daily. 3 tablets twice a day      . calcium carbonate (TUMS - DOSED IN MG ELEMENTAL CALCIUM) 500 MG chewable tablet Chew 1 tablet by mouth 2 (two) times daily.      . cetirizine (ZYRTEC) 10 MG tablet Take 10 mg by mouth daily.      [name]  Cyanocobalamin (VITAMIN B 12 PO) Take 1 tablet by mouth every Saturday.       . cyclobenzaprine (FLEXERIL) 5 MG tablet TAKE ONE TABLET BY MOUTH AT BEDTIME AS NEEDED FOR MUSCLE SPASM  20 tablet  0  . diltiazem (DILACOR XR) 240 MG 24 hr capsule Take 240 mg by mouth every evening.      [name] DIOVAN 80 MG tablet TAKE ONE TABLET BY MOUTH EVERY DAY  90 tablet  0  . ergocalciferol (VITAMIN D2) 50000 UNITS capsule Take 50,000 Units by mouth every Saturday. Wednesday      . fluticasone (FLONASE) 50 MCG/ACT nasal spray Place 2 sprays into the nose daily as needed for allergies.      . furosemide (LASIX) 40 MG tablet TAKE ONE TABLET BY MOUTH EVERY DAY  30 tablet  0  . hydrochlorothiazide (HYDRODIURIL) 25 MG tablet Take 25 mg by mouth daily.      [name] HYDROcodone-acetaminophen (NORCO) 7.5-325 MG per tablet Take 1  tablet by mouth every 6 (six) hours as needed for pain.      . iron polysaccharides (NU-IRON) 150 MG capsule Take 150 mg by mouth every 3 (three) days.       [name] levothyroxine (SYNTHROID, LEVOTHROID) 137 MCG tablet Take 137 mcg by mouth every morning. 3 pills at once      . Magnesium 250 MG TABS Take 250 mg by mouth every other day.      Maxwell Caul Bicarbonate (ZEGERID) 20-1100 MG CAPS Take 1 capsule by mouth daily before breakfast.       . Rivaroxaban (XARELTO) 20 MG TABS Take 20 mg by mouth daily.      . traMADol (ULTRAM) 50 MG tablet Take 50 mg by mouth every 6 (six) hours as needed for pain.      . valsartan (DIOVAN) 80 MG tablet Take 80 mg by mouth daily.      . vitamin E (VITAMIN E) 1000 UNIT capsule Take 1,000 Units by mouth every morning.      . zafirlukast (ACCOLATE) 20 MG tablet Take 20 mg by mouth every morning.       No facility-administered medications prior to visit.    PE: Blood pressure 147/88, pulse 105, temperature 98.3 F (36.8 C), temperature source Oral, resp. rate 16, weight 279 lb 8 oz (126.78 kg), last menstrual period 06/11/2012, SpO2 95.00%. Gen: Alert, well appearing, morbidly obese WF in NAD.  Patient is oriented to person, place, time, and situation. AFFECT: pleasant, lucid thought and speech. Walks with limp favoring left side. Palpation of lateral region of left glut and over left trochanteric process is tender.  Passive ROM of hip is free of pain. Active flexion/extension at left hip brings pain in left glut/lat hip region.  She has mild pain with resisted IR of left hip. McMurray's maneuver elicited no significant pain.   IMPRESSION AND PLAN:  Gluteal strain and trochanteric bursitis, left side. No sign of internal derangement of hip or SI joint etiology of pain. Dermatran anti-inflammatory compound rx'd, home stretching exercises printed out and given to pt. Vicodin 7.5/325, 1-2 q6h prn severe pain, #30, no RF. She is set up for PT in May  already for her foot/balance issues so hopefully her present sx's can be addressed there as well.  FOLLOW UP: prn

## 2012-09-15 ENCOUNTER — Telehealth: Payer: Self-pay | Admitting: *Deleted

## 2012-09-15 NOTE — Telephone Encounter (Signed)
Received fax from [facility] RE: px compound anti-inflammatory Rx 04.21.14, informing that Insurance is requiring prior authorization. [place] at [phone] to initiate prior authorization for medication. Prior Authorization DENIED--requirements of Insurance: tried oral Gabapentin & studies to verify that topical is more beneficial over oral/SLS Will inform patient via letter per [employer].

## 2012-09-16 NOTE — Telephone Encounter (Signed)
Noted  

## 2012-09-23 ENCOUNTER — Encounter: Payer: Self-pay | Admitting: Family

## 2012-09-23 ENCOUNTER — Other Ambulatory Visit: Payer: Self-pay | Admitting: Family Medicine

## 2012-09-23 NOTE — Telephone Encounter (Signed)
Refill request for Hydro-acetaminophen Last filled-09/13/12, #30 x0 Last seen-09/13/12 Follow up -11/10/12 Please advise refill?

## 2012-09-24 ENCOUNTER — Telehealth: Payer: Self-pay | Admitting: *Deleted

## 2012-09-24 MED ORDER — POTASSIUM CHLORIDE 2 MEQ/ML FOR ORAL USE: ORAL | Status: DC

## 2012-09-24 NOTE — Telephone Encounter (Signed)
Received fax from pharmacy stating they do not have 2 meq/mL Potassium in stock.  They have 40 meq / 30mL. Per verbal from Provider ok to dispense 26meq/30mL and take 15mL daily ( ). Notified pharmacist, Joe at [employer].

## 2012-09-26 ENCOUNTER — Other Ambulatory Visit: Payer: Self-pay | Admitting: Family Medicine

## 2012-09-27 NOTE — Telephone Encounter (Signed)
Please advise refill for Norco. Last seen on 09/13/12. Follow-up 11/10/12.

## 2012-09-28 ENCOUNTER — Encounter: Payer: Self-pay | Admitting: Family

## 2012-09-28 NOTE — Telephone Encounter (Signed)
i am not comfortable continuing this long term. She can take tramadol prn. Ok to send #30 tabs if she needs refill.

## 2012-09-30 ENCOUNTER — Other Ambulatory Visit: Payer: Self-pay | Admitting: Family

## 2012-09-30 NOTE — Telephone Encounter (Signed)
Received tramadol request from [facility]. Spoke with [name]. She was seen by Dr Milinda Cave on 09/13/12 and diagnosed with bursitis of her hip. She started [name] 2 days ago and has another session tomorrow. [name] states that Dr Milinda Cave has been prescribing hydrocodone and she is out. Last refill was 09/13/12. [name] states that she takes tramadol during the day and only uses the hydrocodone at night.  Verified with [facility] that they did not receive hydrocodone Rx that was printed on 09/23/12 by Dr Milinda Cave. Placed call to [facility] office to determine if Rx is there to be picked up. Awaiting return call.

## 2012-10-01 ENCOUNTER — Encounter: Payer: Self-pay | Admitting: Family

## 2012-10-01 DIAGNOSIS — M25559 Pain in unspecified hip: Secondary | ICD-10-CM

## 2012-10-01 NOTE — Telephone Encounter (Signed)
Refill request for tramadol Last filled by MD on -09/13/12 Last seen-08/10/12 Next visit-11/10/12 Please advise refill?

## 2012-10-13 ENCOUNTER — Other Ambulatory Visit: Payer: Self-pay | Admitting: Family

## 2012-10-28 ENCOUNTER — Other Ambulatory Visit: Payer: Self-pay | Admitting: Family

## 2012-11-01 ENCOUNTER — Encounter: Payer: Self-pay | Admitting: Family

## 2012-11-08 ENCOUNTER — Other Ambulatory Visit: Payer: Self-pay | Admitting: Family

## 2012-11-10 ENCOUNTER — Ambulatory Visit (INDEPENDENT_AMBULATORY_CARE_PROVIDER_SITE_OTHER): Payer: BC Managed Care – PPO | Admitting: Family

## 2012-11-10 ENCOUNTER — Encounter: Payer: Self-pay | Admitting: Family

## 2012-11-10 VITALS — BP 130/82 | HR 89 | Temp 97.9°F | Wt 305.0 lb

## 2012-11-10 DIAGNOSIS — M129 Arthropathy, unspecified: Secondary | ICD-10-CM

## 2012-11-10 DIAGNOSIS — R319 Hematuria, unspecified: Secondary | ICD-10-CM

## 2012-11-10 DIAGNOSIS — M1990 Unspecified osteoarthritis, unspecified site: Secondary | ICD-10-CM

## 2012-11-10 DIAGNOSIS — J45909 Unspecified asthma, uncomplicated: Secondary | ICD-10-CM

## 2012-11-10 DIAGNOSIS — H6691 Otitis media, unspecified, right ear: Secondary | ICD-10-CM

## 2012-11-10 DIAGNOSIS — M25559 Pain in unspecified hip: Secondary | ICD-10-CM

## 2012-11-10 DIAGNOSIS — M25552 Pain in left hip: Secondary | ICD-10-CM

## 2012-11-10 DIAGNOSIS — D62 Acute posthemorrhagic anemia: Secondary | ICD-10-CM

## 2012-11-10 DIAGNOSIS — H6690 Otitis media, unspecified, unspecified ear: Secondary | ICD-10-CM

## 2012-11-10 DIAGNOSIS — D649 Anemia, unspecified: Secondary | ICD-10-CM

## 2012-11-10 DIAGNOSIS — E876 Hypokalemia: Secondary | ICD-10-CM

## 2012-11-10 LAB — CBC WITH DIFFERENTIAL/PLATELET
HCT: 35.7 % — ABNORMAL LOW (ref 36.0–46.0)
Hemoglobin: 11.3 g/dL — ABNORMAL LOW (ref 12.0–15.0)
Lymphocytes Relative: 18 % (ref 12–46)
Monocytes Absolute: 0.8 10*3/uL (ref 0.1–1.0)
Monocytes Relative: 6 % (ref 3–12)
Neutro Abs: 9.5 10*3/uL — ABNORMAL HIGH (ref 1.7–7.7)
RBC: 4.9 MIL/uL (ref 3.87–5.11)
WBC: 12.8 10*3/uL — ABNORMAL HIGH (ref 4.0–10.5)

## 2012-11-10 LAB — BASIC METABOLIC PANEL
Calcium: 8.3 mg/dL — ABNORMAL LOW (ref 8.4–10.5)
Sodium: 140 mEq/L (ref 135–145)

## 2012-11-10 LAB — IRON: Iron: 31 ug/dL — ABNORMAL LOW (ref 42–145)

## 2012-11-10 LAB — RHEUMATOID FACTOR: Rhuematoid fact SerPl-aCnc: <10 IU/mL (ref <=14)

## 2012-11-10 MED ORDER — CEFUROXIME AXETIL 500 MG PO TABS: 500.0000 mg | ORAL_TABLET | Freq: Two times a day (BID) | ORAL | Status: DC

## 2012-11-10 MED ORDER — DESOXIMETASONE 0.05 % EX CREA: TOPICAL_CREAM | Freq: Two times a day (BID) | CUTANEOUS | Status: DC

## 2012-11-10 NOTE — Assessment & Plan Note (Signed)
Stable.  Nephrolithiasis is being managed by urology. Tolerating plavix.

## 2012-11-10 NOTE — Patient Instructions (Signed)
Please complete your lab work prior to leaving. Please schedule a follow up appointment in 3 months.  

## 2012-11-10 NOTE — Assessment & Plan Note (Signed)
Improving with PT and topical rx.  Continue same.

## 2012-11-10 NOTE — Assessment & Plan Note (Signed)
Stable on symbicort and prn albuterol.  

## 2012-11-10 NOTE — Assessment & Plan Note (Signed)
She is having pain in the left ear which is normal. R ear clinically much worse but no pain.  Rx provided for ceftin which pt wishes to hold on to for a couple of days and start if symptoms worsen or if symptoms do not improve which I think is reasonable.

## 2012-11-10 NOTE — Progress Notes (Signed)
Subjective:    Patient ID: [name], female    DOB: [birth year], 57 y.o.   MRN: [id number]  HPI  Nephrolithiasis- reports 1-2 times a week passes small stones.  Still having left sided pain. On xarelto. Rare mild hematuria.    She is continuing PT for hip pain.  Diagnosed with bursitis.  PT is helping.   Asthma is "ok." has has some sudden attacks in the evenings.  She is using symbicort.  Using albuterol MDI prn.    Sinus drainage- "can hear pulse in her left ear." mild drainage.      Review of Systems    see HPI  Past Medical History  Diagnosis Date  . Allergic rhinitis   . Asthma   . Hypertension   . Hypothyroidism   . Anemia, iron deficiency   . Vitamin D deficiency   . Thyroid cancer   . Morbid obesity   . GERD (gastroesophageal reflux disease)   . Hypoparathyroidism 01/07/2011  . Arthritis   . Fatty liver 01/07/2012  . Headache(784.0)     occasional sinus headache   . Atrial fibrillation August 2012  . Chronic kidney disease     kidney srones  . PONV (postoperative nausea and vomiting)   . History of blood transfusion     History   Social History  . Marital Status: Single    Spouse Name: N/A    Number of Children: 0  . Years of Education: N/A   Occupational History  . works in Medical laboratory scientific officer   . [employer]    Social History Main Topics  . Smoking status: Former Smoker -- 10 years    Quit date: 05/27/1995  . Smokeless tobacco: Never Used  . Alcohol Use: Yes     Comment: Occasional - one to three per month  . Drug Use: No  . Sexually Active: Yes -- Female partner(s)   Other Topics Concern  . 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  She was light smoker for 10 years.                 Past Surgical History  Procedure Laterality Date  . Tonsillectomy and adenoidectomy    . Bunionectomy      bilateral   . Appendectomy    . Gastric bypass    . Thyroidectomy  05/15/2010  . Cyst on ovary removed     . Ureteroscopy  03/03/2012    Procedure: URETEROSCOPY;  Surgeon: Valetta Fuller, MD;  Location: [facility];  Service: Urology;  Laterality: Left;  . Cystoscopy w/ retrogrades  03/03/2012    Procedure: CYSTOSCOPY WITH RETROGRADE PYELOGRAM;  Surgeon: Valetta Fuller, MD;  Location: [facility];  Service: Urology;  Laterality: Bilateral;  . Lithotripsy  03/2012  . Cystoscopy with retrograde pyelogram, ureteroscopy and stent placement Left 08/25/2012    Procedure: CYSTOSCOPY WITH RETROGRADE PYELOGRAM, URETEROSCOPY;  Surgeon: Valetta Fuller, MD;  Location: [facility];  Service: Urology;  Laterality: Left;  . Holmium laser application Left 08/25/2012    Procedure: HOLMIUM LASER APPLICATION;  Surgeon: Valetta Fuller, MD;  Location: [facility];  Service: Urology;  Laterality: Left;    Family History  Problem Relation Age of Onset  . Hypertension Father   . Diabetes Father   . Lung cancer Father   .  Heart attack Father     MI at age 29  . Hypertension Mother   . Hyperthyroidism Mother   . Heart disease Mother   . Heart attack Mother     MI at age 60  . Asthma Brother     Allergies  Allergen Reactions  . Amoxicillin-Pot Clavulanate     headache  . Penicillins     headache    Current Outpatient Prescriptions on File Prior to Visit  Medication Sig Dispense Refill  . albuterol (PROVENTIL HFA;VENTOLIN HFA) 108 (90 BASE) MCG/ACT inhaler Inhale 2 puffs into the lungs every 6 (six) hours as needed for wheezing.      [name] albuterol (PROVENTIL) (2.5 MG/3ML) 0.083% nebulizer solution Take 2.5 mg by nebulization every 4 (four) hours as needed for wheezing.      . Ascorbic Acid (VITAMIN C CR) 1000 MG TBCR Take 1 twice a week      . calcitRIOL (ROCALTROL) 0.5 MCG capsule Take 1.5 mcg by mouth 2 (two) times daily. 3 tablets twice a day      . calcium carbonate (TUMS - DOSED IN MG ELEMENTAL CALCIUM) 500 MG chewable tablet Chew 1  tablet by mouth 2 (two) times daily.      . cetirizine (ZYRTEC) 10 MG tablet Take 10 mg by mouth daily.      . Cyanocobalamin (VITAMIN B 12 PO) Take 1 tablet by mouth every Saturday.       . diltiazem (DILACOR XR) 240 MG 24 hr capsule Take 240 mg by mouth every evening.      . ergocalciferol (VITAMIN D2) 50000 UNITS capsule Take 50,000 Units by mouth every Saturday. Wednesday      . fluticasone (FLONASE) 50 MCG/ACT nasal spray Place 2 sprays into the nose daily as needed for allergies.      . furosemide (LASIX) 40 MG tablet TAKE ONE TABLET BY MOUTH ONCE DAILY  30 tablet  2  . hydrochlorothiazide (HYDRODIURIL) 25 MG tablet TAKE 1 TABLET BY MOUTH EVERY DAY  30 tablet  3  . HYDROcodone-acetaminophen (NORCO) 7.5-325 MG per tablet TAKE ONE TO TWO TABLETS BY MOUTH EVERY 6 HOURS AS NEEDED FOR PAIN  30 tablet  0  . iron polysaccharides (NU-IRON) 150 MG capsule Take 150 mg by mouth every 3 (three) days.       [name] levothyroxine (SYNTHROID, LEVOTHROID) 137 MCG tablet Take 137 mcg by mouth every morning. 3 pills at once      . Magnesium 250 MG TABS Take 250 mg by mouth every other day.      Maxwell Caul Bicarbonate (ZEGERID) 20-1100 MG CAPS Take 1 capsule by mouth daily before breakfast.       . PRESCRIPTION MEDICATION Potassium chloride. / 30mL.  Take 15mL daily ( ).      . Rivaroxaban (XARELTO) 20 MG TABS Take 20 mg by mouth daily.      . SYMBICORT 160-4.5 MCG/ACT inhaler INHALE TWO PUFFS INTO LUNGS TWICE DAILY  11 g  3  . traMADol (ULTRAM) 50 MG tablet TAKE 1 TABLET BY MOUTH EVERY 6 HOURS  30 tablet  0  . valsartan (DIOVAN) 80 MG tablet Take 80 mg by mouth daily.      . vitamin E (VITAMIN E) 1000 UNIT capsule Take 1,000 Units by mouth every morning.      . zafirlukast (ACCOLATE) 20 MG tablet Take 20 mg by mouth every morning.       No current facility-administered medications on file prior to  visit.    BP 130/82  Pulse 89  Temp(Src) 97.9 F (36.6 C) (Oral)  Wt 305 lb (138.347 kg)   BMI 52.33 kg/m2  SpO2 96%  LMP 06/11/2012  . Objective:   Physical Exam  Constitutional: She appears well-developed and well-nourished. No distress.  HENT:  Mouth/Throat: No oropharyngeal exudate, posterior oropharyngeal edema or posterior oropharyngeal erythema.  R tm is erythematous without bulging.  L TM is intact.  Cardiovascular: Normal rate and regular rhythm.   No murmur heard. Pulmonary/Chest: Effort normal and breath sounds normal. No respiratory distress. She has no wheezes. She has no rales. She exhibits no tenderness.  Genitourinary:  Neg CVAT bilaterally.   Lymphadenopathy:    She has no cervical adenopathy.  Psychiatric: She has a normal mood and affect. Her behavior is normal. Thought content normal.          Assessment & Plan:

## 2012-11-11 LAB — ANA: Anti Nuclear Antibody(ANA): NEGATIVE

## 2012-11-13 ENCOUNTER — Encounter: Payer: Self-pay | Admitting: Family

## 2012-11-13 NOTE — Addendum Note (Signed)
Addended by: Sandford Craze on: 11/13/2012 09:47 AM   Modules accepted: Orders

## 2012-11-17 ENCOUNTER — Ambulatory Visit (INDEPENDENT_AMBULATORY_CARE_PROVIDER_SITE_OTHER): Payer: BC Managed Care – PPO | Admitting: Cardiology

## 2012-11-17 ENCOUNTER — Encounter: Payer: Self-pay | Admitting: Cardiology

## 2012-11-17 VITALS — BP 132/80 | HR 79 | Wt 302.0 lb

## 2012-11-17 DIAGNOSIS — R9430 Abnormal result of cardiovascular function study, unspecified: Secondary | ICD-10-CM

## 2012-11-17 DIAGNOSIS — I10 Essential (primary) hypertension: Secondary | ICD-10-CM

## 2012-11-17 DIAGNOSIS — I4891 Unspecified atrial fibrillation: Secondary | ICD-10-CM

## 2012-11-17 NOTE — Assessment & Plan Note (Signed)
Patient remains in sinus rhythm. Continue Cardizem. Continue xeralto. We'll consider antiarrhythmic in the future if she develops more frequent episodes.

## 2012-11-17 NOTE — Assessment & Plan Note (Signed)
Continue present blood pressure medications. 

## 2012-11-17 NOTE — Assessment & Plan Note (Signed)
Previous study felt to be low risk and she is not having symptoms. Plan medical therapy.

## 2012-11-17 NOTE — Addendum Note (Signed)
Addended by: Freddi Starr on: 11/17/2012 01:48 PM   Modules accepted: Orders

## 2012-11-17 NOTE — Patient Instructions (Addendum)
Your physician wants you to follow-up in: ONE YEAR WITH DR CRENSHAW You will receive a reminder letter in the mail two months in advance. If you don't receive a letter, please call our office to schedule the follow-up appointment.  

## 2012-11-17 NOTE — Progress Notes (Signed)
HPI: Pleasant female for fu of atrial fibrillation. I initially saw her in July of 2012 for evaluation of chest pain. Echocardiogram in January of 2012 showed normal LV function, mild left ventricular hypertrophy and mild left atrial enlargement. CTA showed no pulmonary embolus in July 2012. Patient had a Myoview in July of 2012. Her ejection fraction is 64%. Patient had mild ischemia in the inferior wall. We elected to treat medically. Patient was also having palpitations and near syncopal episodes. A CardioNet revealed sinus rhythm with PACs, brief runs of PAT and brief runs of PAF. Most recent blood work in June of 2014 showed a BUN and creatinine of 17 and 0.93. Hemoglobin 11.3 with an MCV of 72.9. I last saw her in June of 2013. Since then, she denies dyspnea or chest pain. She has occasional brief palpitations for less than 1 minute. No bleeding. She did have hematuria last fall when she was being treated for nephrolithiasis.  Current Outpatient Prescriptions  Medication Sig Dispense Refill  . albuterol (PROVENTIL HFA;VENTOLIN HFA) 108 (90 BASE) MCG/ACT inhaler Inhale 2 puffs into the lungs every 6 (six) hours as needed for wheezing.      [name] albuterol (PROVENTIL) (2.5 MG/3ML) 0.083% nebulizer solution Take 2.5 mg by nebulization every 4 (four) hours as needed for wheezing.      . Ascorbic Acid (VITAMIN C CR) 1000 MG TBCR Take 1 twice a week      . calcitRIOL (ROCALTROL) 0.5 MCG capsule 3 tablets twice a day      . calcium carbonate (TUMS - DOSED IN MG ELEMENTAL CALCIUM) 500 MG chewable tablet Chew 1 tablet by mouth 2 (two) times daily.      . cefUROXime (CEFTIN) 500 MG tablet Take 1 tablet (500 mg total) by mouth 2 (two) times daily.  20 tablet  0  . cetirizine (ZYRTEC) 10 MG tablet Take 10 mg by mouth daily.      . Cyanocobalamin (VITAMIN B 12 PO) Take 1 tablet by mouth every Saturday.       . desoximetasone (TOPICORT) 0.05 % cream Apply topically 2 (two) times daily.  30 g  0  . diltiazem  (DILACOR XR) 240 MG 24 hr capsule Take 240 mg by mouth every evening.      . ergocalciferol (VITAMIN D2) 50000 UNITS capsule Take 50,000 Units by mouth every Saturday. Wednesday      . fluticasone (FLONASE) 50 MCG/ACT nasal spray Place 2 sprays into the nose daily as needed for allergies.      . furosemide (LASIX) 40 MG tablet TAKE ONE TABLET BY MOUTH ONCE DAILY  30 tablet  2  . hydrochlorothiazide (HYDRODIURIL) 25 MG tablet TAKE 1 TABLET BY MOUTH EVERY DAY  30 tablet  3  . iron polysaccharides (NU-IRON) 150 MG capsule Twice wekly      . levothyroxine (SYNTHROID, LEVOTHROID) 137 MCG tablet Take 137 mcg by mouth every morning. 3 pills at once      . Magnesium 250 MG TABS Take 250 mg by mouth every other day.      Maxwell Caul Bicarbonate (ZEGERID) 20-1100 MG CAPS Take 1 capsule by mouth daily before breakfast.       . PRESCRIPTION MEDICATION Potassium chloride. / 30mL.  Take 15mL daily ( ).      . Rivaroxaban (XARELTO) 20 MG TABS Take 20 mg by mouth daily.      . SYMBICORT 160-4.5 MCG/ACT inhaler INHALE TWO PUFFS INTO LUNGS TWICE DAILY  11 g  3  . traMADol (ULTRAM) 50 MG tablet TAKE 1 TABLET BY MOUTH EVERY 6 HOURS  30 tablet  0  . valsartan (DIOVAN) 80 MG tablet Take 80 mg by mouth daily.      . vitamin E (VITAMIN E) 1000 UNIT capsule Take 1,000 Units by mouth every morning.      . zafirlukast (ACCOLATE) 20 MG tablet Take 20 mg by mouth every morning.       No current facility-administered medications for this visit.     Past Medical History  Diagnosis Date  . Allergic rhinitis   . Asthma   . Hypertension   . Hypothyroidism   . Anemia, iron deficiency   . Vitamin D deficiency   . Thyroid cancer   . Morbid obesity   . GERD (gastroesophageal reflux disease)   . Hypoparathyroidism 01/07/2011  . Arthritis   . Fatty liver 01/07/2012  . Headache(784.0)     occasional sinus headache   . Atrial fibrillation August 2012  . Chronic kidney disease     kidney srones  . PONV  (postoperative nausea and vomiting)   . History of blood transfusion     Past Surgical History  Procedure Laterality Date  . Tonsillectomy and adenoidectomy    . Bunionectomy      bilateral  . Appendectomy    . Gastric bypass    . Thyroidectomy  05/15/2010  . Cyst on ovary removed     . Ureteroscopy  03/03/2012    Procedure: URETEROSCOPY;  Surgeon: Valetta Fuller, MD;  Location: [facility];  Service: Urology;  Laterality: Left;  . Cystoscopy w/ retrogrades  03/03/2012    Procedure: CYSTOSCOPY WITH RETROGRADE PYELOGRAM;  Surgeon: Valetta Fuller, MD;  Location: [facility];  Service: Urology;  Laterality: Bilateral;  . Lithotripsy  03/2012  . Cystoscopy with retrograde pyelogram, ureteroscopy and stent placement Left 08/25/2012    Procedure: CYSTOSCOPY WITH RETROGRADE PYELOGRAM, URETEROSCOPY;  Surgeon: Valetta Fuller, MD;  Location: [facility];  Service: Urology;  Laterality: Left;  . Holmium laser application Left 08/25/2012    Procedure: HOLMIUM LASER APPLICATION;  Surgeon: Valetta Fuller, MD;  Location: [facility];  Service: Urology;  Laterality: Left;    History   Social History  . Marital Status: Single    Spouse Name: N/A    Number of Children: 0  . Years of Education: N/A   Occupational History  . works in Medical laboratory scientific officer   . [employer]    Social History Main Topics  . Smoking status: Former Smoker -- 10 years    Quit date: 05/27/1995  . Smokeless tobacco: Never Used  . Alcohol Use: Yes     Comment: Occasional - one to three per month  . Drug Use: No  . Sexually Active: Yes -- Female partner(s)   Other Topics Concern  . 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  She was light smoker for 10 years.                 ROS: no fevers or chills, productive cough, hemoptysis, dysphasia, odynophagia, melena, hematochezia, dysuria, hematuria, rash, seizure  activity, orthopnea, PND, pedal edema, claudication. Remaining systems are negative.  Physical Exam: Well-developed obese in no acute distress.  Skin is warm and dry.  HEENT is normal.  Neck is  supple.  Chest is clear to auscultation with normal expansion.  Cardiovascular exam is regular rate and rhythm.  Abdominal exam nontender or distended. No masses palpated. Extremities show trace edema. neuro grossly intact  ECG sinus rhythm at a rate of 79. Nonspecific ST changes.

## 2012-11-22 ENCOUNTER — Telehealth: Payer: Self-pay | Admitting: *Deleted

## 2012-11-22 NOTE — Telephone Encounter (Signed)
Received fax from [facility] on 11/17/12 stating desoximetasone cream has been discontinued and requests alternative. Per Provider, ok to dispense betamethasone ointment. Apply topically twice a day. #15gm x no refill. Response faxed to [phone]. Med list updated.

## 2012-11-24 ENCOUNTER — Encounter: Payer: Self-pay | Admitting: Family

## 2012-11-29 ENCOUNTER — Other Ambulatory Visit: Payer: Self-pay | Admitting: Family

## 2012-12-07 ENCOUNTER — Telehealth: Payer: Self-pay | Admitting: Family

## 2012-12-07 NOTE — Telephone Encounter (Signed)
Opened in error

## 2012-12-10 ENCOUNTER — Telehealth: Payer: Self-pay | Admitting: Cardiology

## 2012-12-10 MED ORDER — RIVAROXABAN 20 MG PO TABS: 20.0000 mg | ORAL_TABLET | Freq: Every day | ORAL | Status: DC

## 2012-12-10 NOTE — Telephone Encounter (Signed)
NEEDS REFILL OF [name]

## 2012-12-24 ENCOUNTER — Other Ambulatory Visit: Payer: Self-pay | Admitting: *Deleted

## 2012-12-24 MED ORDER — VALSARTAN 80 MG PO TABS: ORAL_TABLET | ORAL | Status: DC

## 2012-12-24 NOTE — Telephone Encounter (Signed)
Rx request to pharmacy/SLS  

## 2013-01-03 ENCOUNTER — Ambulatory Visit (INDEPENDENT_AMBULATORY_CARE_PROVIDER_SITE_OTHER): Payer: BC Managed Care – PPO | Admitting: Obstetrics & Gynecology

## 2013-01-03 ENCOUNTER — Encounter: Payer: Self-pay | Admitting: Obstetrics & Gynecology

## 2013-01-03 VITALS — BP 136/62 | HR 60 | Resp 16 | Ht 65.0 in | Wt 302.2 lb

## 2013-01-03 DIAGNOSIS — Z124 Encounter for screening for malignant neoplasm of cervix: Secondary | ICD-10-CM

## 2013-01-03 DIAGNOSIS — N950 Postmenopausal bleeding: Secondary | ICD-10-CM

## 2013-01-03 DIAGNOSIS — Z01419 Encounter for gynecological examination (general) (routine) without abnormal findings: Secondary | ICD-10-CM

## 2013-01-03 DIAGNOSIS — Z0000 Encounter for general adult medical examination without abnormal findings: Secondary | ICD-10-CM

## 2013-01-03 LAB — POCT URINALYSIS DIPSTICK
Protein, UA: NEGATIVE
Urobilinogen, UA: NEGATIVE
pH, UA: 5

## 2013-01-03 MED ORDER — NYSTATIN 100000 UNIT/GM EX CREA: TOPICAL_CREAM | Freq: Two times a day (BID) | CUTANEOUS | Status: DC

## 2013-01-03 NOTE — Patient Instructions (Signed)
Endometrial Biopsy Post-procedure Instructions   Cramping is common.  You may take Ibuprofen, Aleve, or Tylenol for the cramping.  This should resolve within 24 hours.     You may have a small amount of spotting.  You should wear a mini pad for the next few days.   You may have intercourse in 24 hours.   You need to call the office if you have any pelvic pain, fever, heavy bleeding, or foul smelling vaginal discharge.   Shower or bathe as normal   You will be notified within one week of your biopsy results or we will discuss your results at your follow-up appointment if needed.    

## 2013-01-03 NOTE — Progress Notes (Signed)
57 y.o. G0P0000 SingleCaucasianF here for annual exam.  Stopped bleeding around 2011.  Never on HRT.  Has spotted this year off and on for last seven months.  Saw it in January and then in March.  Spotting is light and not heavy.  Has noted some moodiness and cramping.  Declines MMG.  Hasn't been to gyn in over 30 years.  No recent pap.  Major reason she is here is for vaginal dryness/irritation which she has had for years.  Patient's last menstrual period was 05/26/2009.          Sexually active: yes  The current method of family planning is none.    Exercising: yes   Smoker:  no  Health Maintenance: Pap:  30 years ago History of abnormal Pap:  Yes, years ago/no biopsy MMG:  none Colonoscopy:  2006 ? When to repeat.  Was done in [place] BMD:   none TDaP:  ?  Screening Labs: 6/14, in EPIC, Urine today: WBC-trace, RBC-trace   reports that she quit smoking about 17 years ago. She has never used smokeless tobacco. She reports that  drinks alcohol. She reports that she does not use illicit drugs.  Past Medical History  Diagnosis Date  . Allergic rhinitis   . Asthma   . Hypertension   . Hypothyroidism   . Anemia, iron deficiency   . Vitamin D deficiency   . Thyroid cancer   . Morbid obesity   . GERD (gastroesophageal reflux disease)   . Hypoparathyroidism 01/07/2011  . Arthritis   . Fatty liver 01/07/2012  . Headache(784.0)     occasional sinus headache   . Atrial fibrillation August 2012  . Chronic kidney disease     kidnsy stones  . PONV (postoperative nausea and vomiting)   . History of blood transfusion   . Fibroid 17    fibroid cyst on left fallopian tube    Past Surgical History  Procedure Laterality Date  . Tonsillectomy and adenoidectomy    . Bunionectomy      bilateral  . Appendectomy    . Gastric bypass    . Thyroidectomy  05/15/2010  . Cyst on ovary removed     . Ureteroscopy  03/03/2012    Procedure: URETEROSCOPY;  Surgeon: Valetta Fuller, MD;  Location:  [facility];  Service: Urology;  Laterality: Left;  . Cystoscopy w/ retrogrades  03/03/2012    Procedure: CYSTOSCOPY WITH RETROGRADE PYELOGRAM;  Surgeon: Valetta Fuller, MD;  Location: [facility];  Service: Urology;  Laterality: Bilateral;  . Lithotripsy  03/2012  . Cystoscopy with retrograde pyelogram, ureteroscopy and stent placement Left 08/25/2012    Procedure: CYSTOSCOPY WITH RETROGRADE PYELOGRAM, URETEROSCOPY;  Surgeon: Valetta Fuller, MD;  Location: [facility];  Service: Urology;  Laterality: Left;  . Holmium laser application Left 08/25/2012    Procedure: HOLMIUM LASER APPLICATION;  Surgeon: Valetta Fuller, MD;  Location: [facility];  Service: Urology;  Laterality: Left;    Current Outpatient Prescriptions  Medication Sig Dispense Refill  . albuterol (PROVENTIL HFA;VENTOLIN HFA) 108 (90 BASE) MCG/ACT inhaler Inhale 2 puffs into the lungs every 6 (six) hours as needed for wheezing.      [name] albuterol (PROVENTIL) (2.5 MG/3ML) 0.083% nebulizer solution Take 2.5 mg by nebulization every 4 (four) hours as needed for wheezing.      . Ascorbic Acid (VITAMIN C CR) 1000 MG TBCR Take 1 twice a week      . Black Cohosh 40 MG CAPS Take by  mouth daily.      . calcitRIOL (ROCALTROL) 0.5 MCG capsule 0.137 mcg. 3 tablets twice a day      . calcium carbonate (TUMS - DOSED IN MG ELEMENTAL CALCIUM) 500 MG chewable tablet Chew 1 tablet by mouth 2 (two) times daily.      . cetirizine (ZYRTEC) 10 MG tablet Take 10 mg by mouth daily.      . Cyanocobalamin (VITAMIN B 12 PO) Take 1 tablet by mouth every Wednesday.       . desoximetasone (TOPICORT) 0.05 % cream Apply topically 2 (two) times daily.      [name] diltiazem (DILACOR XR) 240 MG 24 hr capsule Take 240 mg by mouth every evening.      . ergocalciferol (VITAMIN D2) 50000 UNITS capsule Take 50,000 Units by mouth every Saturday. Wednesday      . fluticasone (FLONASE) 50 MCG/ACT nasal spray Place 2 sprays into the nose daily as needed for allergies.      . furosemide (LASIX) 40 MG  tablet TAKE ONE TABLET BY MOUTH ONCE DAILY  30 tablet  2  . hydrochlorothiazide (HYDRODIURIL) 25 MG tablet TAKE 1 TABLET BY MOUTH EVERY DAY  30 tablet  3  . iron polysaccharides (NU-IRON) 150 MG capsule Every thurs      . levothyroxine (SYNTHROID, LEVOTHROID) 137 MCG tablet Take 137 mcg by mouth every morning. 3 pills at once      . Magnesium 250 MG TABS Take 250 mg by mouth every other day.      Maxwell Caul Bicarbonate (ZEGERID) 20-1100 MG CAPS Take 1 capsule by mouth daily before breakfast.       . POTASSIUM CITRATE PO Take 99 mg by mouth daily.      . Rivaroxaban (XARELTO) 20 MG TABS Take 1 tablet (20 mg total) by mouth daily.  30 tablet  9  . SYMBICORT 160-4.5 MCG/ACT inhaler INHALE TWO PUFFS INTO LUNGS TWICE DAILY  11 g  3  . traMADol (ULTRAM) 50 MG tablet TAKE 1 TABLET BY MOUTH EVERY 6 HOURS  30 tablet  0  . valsartan (DIOVAN) 80 MG tablet TAKE 1 TABLET BY MOUTH EVERY DAY  30 tablet  0  . vitamin E (VITAMIN E) 1000 UNIT capsule Take 1,000 Units by mouth every morning.      . zafirlukast (ACCOLATE) 20 MG tablet Take 20 mg by mouth every morning.       No current facility-administered medications for this visit.    Family History  Problem Relation Age of Onset  . Hypertension Father   . Diabetes Father   . Lung cancer Father   . Heart attack Father     MI at age 45  . Hypertension Mother   . Hyperthyroidism Mother   . Heart disease Mother   . Heart attack Mother     MI at age 31  . Asthma Brother   . Hypertension Brother     younger  . Heart disease Brother     older    ROS:  Pertinent items are noted in HPI.  Otherwise, a comprehensive ROS was negative.  Exam:   BP 136/62  Pulse 60  Resp 16  Ht 5\' 5"  (1.651 m)  Wt 302 lb 3.2 oz (137.077 kg)  BMI 50.29 kg/m2  LMP 05/26/2009    Height: 5\' 5"  (165.1 cm)  Ht Readings from Last 3 Encounters:  01/03/13 5\' 5"  (1.651 m)  08/19/12 5\' 4"  (1.626 m)  08/10/12 5\' 4"  (1.626  m)    General appearance: alert,  cooperative and appears stated age Head: Normocephalic, without obvious abnormality, atraumatic Neck: no adenopathy, supple, symmetrical, trachea midline and thyroid absent Lungs: clear to auscultation bilaterally Breasts: normal appearance, no masses or tenderness Heart: regular rate and rhythm Abdomen: soft, non-tender; bowel sounds normal; no masses,  no organomegaly Extremities: extremities normal, atraumatic, no cyanosis or edema Skin: Skin color, texture, turgor normal. Erythematous rash in axilla bilaterally. Lymph nodes: Cervical, supraclavicular, and axillary nodes normal. No abnormal inguinal nodes palpated Neurologic: Grossly normal   Pelvic: External genitalia:  no lesions              Urethra:  normal appearing urethra with no masses, tenderness or lesions              Bartholins and Skenes: normal                 Vagina: normal appearing vagina with normal color and discharge, no lesions              Cervix: no lesions              Pap taken: no Bimanual Exam:  Uterus:  normal size, contour, position, consistency, mobility, non-tender              Adnexa: normal adnexa and no mass, fullness, tenderness               Rectovaginal: Confirms               Anus:  normal sphincter tone, no lesions  A:  Well Woman with normal exam PMP bleeding Atrophic vaginitis Lack of gyn care Axillary candida? Morbid obesity H/O thyroidectomy with hypothyroidism and hypoparathyroidism Afib, rate controlled  P:   Mammogram highly recommended.  Patient declines for now. pap smear today with HR HPV Endometrial biopsy done [facility] today. Estrace cream 1gm pv twice weekly.  Samples x 2 given.  Lot [id number], exp 10/15, 2 boxes.  Use externally nightly.  Slight increased risk of DVT/PE discussed with patient. Nystatin cream to pharamcy return annually or prn  An After Visit Summary was printed and given to the patient.

## 2013-01-04 LAB — FOLLICLE STIMULATING HORMONE: FSH: 41.2 m[IU]/mL

## 2013-01-06 ENCOUNTER — Other Ambulatory Visit: Payer: Self-pay | Admitting: Obstetrics & Gynecology

## 2013-01-06 NOTE — Addendum Note (Signed)
Addended by: Jerene Bears on: 01/06/2013 01:02 PM   Modules accepted: Orders

## 2013-01-09 ENCOUNTER — Other Ambulatory Visit: Payer: Self-pay | Admitting: Family

## 2013-01-10 LAB — IPS PAP TEST WITH HPV

## 2013-01-11 ENCOUNTER — Encounter: Payer: Self-pay | Admitting: Gynecology

## 2013-01-11 ENCOUNTER — Ambulatory Visit: Payer: BC Managed Care – PPO | Attending: Gynecology | Admitting: Gynecology

## 2013-01-11 VITALS — BP 130/70 | HR 100 | Temp 98.3°F | Resp 20 | Ht 64.96 in | Wt 301.0 lb

## 2013-01-11 DIAGNOSIS — C549 Malignant neoplasm of corpus uteri, unspecified: Secondary | ICD-10-CM | POA: Insufficient documentation

## 2013-01-11 DIAGNOSIS — Z9884 Bariatric surgery status: Secondary | ICD-10-CM | POA: Insufficient documentation

## 2013-01-11 DIAGNOSIS — Z79899 Other long term (current) drug therapy: Secondary | ICD-10-CM | POA: Insufficient documentation

## 2013-01-11 DIAGNOSIS — C541 Malignant neoplasm of endometrium: Secondary | ICD-10-CM

## 2013-01-11 DIAGNOSIS — I10 Essential (primary) hypertension: Secondary | ICD-10-CM | POA: Insufficient documentation

## 2013-01-11 DIAGNOSIS — E039 Hypothyroidism, unspecified: Secondary | ICD-10-CM | POA: Insufficient documentation

## 2013-01-11 NOTE — Patient Instructions (Addendum)
MRI at [facility] scheduled for August 26th at 8am.  Nothing to eat or drink 4 hours before.  We will contact you with the results.  Please call for any questions or concerns.

## 2013-01-11 NOTE — Progress Notes (Signed)
Consult Note: Gyn-Onc   [name] 57 y.o. female  Chief Complaint  Patient presents with  . Endometrial cancer    New Consult    Assessment : Grade 1 endometrial adenocarcinoma. Multiple medical morbidities. Morbid obesity  Plan: Discussed with the patient management options. She is informed that the standard of care would be to perform hysterectomy. However given her multiple comorbid conditions, believe surgery is a high-risk for that outcomes. We discussed alternative management including oral progestins or a Mirena IUD. After exploring these options we've agreed to proceed with an MRI of the abdomen and pelvis to exclude distant metastatic disease as well as evaluate the size of the tumor the uterus and possible depth of myometrial invasion. If the MRI is favorable, we've agreed to go ahead and have a Mirena IUD placed. The endometrium should be resampled in 3 months.  We'll ask Dr. Leda Quail to place the IUD as we do not have them in our clinic.  Patient is informed the IUD is not always successful and that surgery in the future may need to be reconsidered.      HPI: 57 year old white female seen in consultation at the request of Dr. Leda Quail regarding management of a newly diagnosed endometrial adenocarcinoma (grade 1). Patient underwent menopause in 2011. Over the past year she has had a slight amount of intermittent spotting. She saw Dr. Hyacinth Meeker who obtained an endometrial biopsy showed a grade 1 endometrial adenocarcinoma arising in a background of simple and complex hyperplasia (01/03/2013). She has not taken any hormone replacement therapy.  Patient's only other gynecologic complaint is that of some vaginal dryness and "burning".  Review of her medical history shows a very complex spectrum of medical issues with overlying morbid obesity.  Review of Systems:10 point review of systems is negative except as noted in interval history.   Vitals: Blood pressure  130/70, pulse 100, temperature 98.3 F (36.8 C), resp. rate 20, height 5' 4.96" (1.65 m), weight 301 lb (136.533 kg), last menstrual period 05/26/2009.  Physical Exam: General : The patient is a healthy woman in no acute distress. Morbidly obese HEENT: normocephalic, extraoccular movements normal; neck is supple without thyromegally  Lynphnodes: Supraclavicular and inguinal nodes not enlarged  Abdomen: Soft, non-tender, no ascites, no organomegally, no masses, no hernias, the patient has a large midline incision and a large panniculus.  Pelvic:  EGBUS: Normal female  Vagina: Normal, no lesions  Urethra and Bladder: Normal, non-tender  Cervix: Normal no lesions are noted Uterus: Unable to determine size secondary to the patient's obesity. Bi-manual examination: Non-tender; no adenxal masses or nodularity  Rectal: normal sphincter tone, no masses, no blood  Lower extremities: No edema or varicosities. Normal range of motion      Allergies  Allergen Reactions  . Amoxicillin-Pot Clavulanate     headache  . Penicillins     headache    Past Medical History  Diagnosis Date  . Allergic rhinitis   . Asthma   . Hypertension   . Hypothyroidism   . Anemia, iron deficiency   . Vitamin D deficiency   . Thyroid cancer   . Morbid obesity   . GERD (gastroesophageal reflux disease)   . Hypoparathyroidism 01/07/2011  . Arthritis   . Fatty liver 01/07/2012  . Headache(784.0)     occasional sinus headache   . Atrial fibrillation August 2012  . Chronic kidney disease     kidnsy stones  . PONV (postoperative nausea and vomiting)   .  History of blood transfusion   . Fibroid 1974    fibroid cyst on left fallopian tube  . Abnormal Pap smear     years ago/no biopsy  . Nephrolithiasis 2012    Past Surgical History  Procedure Laterality Date  . Tonsillectomy and adenoidectomy    . Bunionectomy      bilateral  . Appendectomy    . Gastric bypass  1974  . Thyroidectomy  05/15/2010  .  Cyst on ovary removed     . Ureteroscopy  03/03/2012    Procedure: URETEROSCOPY;  Surgeon: Valetta Fuller, MD;  Location: [facility];  Service: Urology;  Laterality: Left;  . Cystoscopy w/ retrogrades  03/03/2012    Procedure: CYSTOSCOPY WITH RETROGRADE PYELOGRAM;  Surgeon: Valetta Fuller, MD;  Location: [facility];  Service: Urology;  Laterality: Bilateral;  . Lithotripsy  03/2012  . Cystoscopy with retrograde pyelogram, ureteroscopy and stent placement Left 08/25/2012    Procedure: CYSTOSCOPY WITH RETROGRADE PYELOGRAM, URETEROSCOPY;  Surgeon: Valetta Fuller, MD;  Location: [facility];  Service: Urology;  Laterality: Left;  . Holmium laser application Left 08/25/2012    Procedure: HOLMIUM LASER APPLICATION;  Surgeon: Valetta Fuller, MD;  Location: [facility];  Service: Urology;  Laterality: Left;    Current Outpatient Prescriptions  Medication Sig Dispense Refill  . albuterol (PROVENTIL HFA;VENTOLIN HFA) 108 (90 BASE) MCG/ACT inhaler Inhale 2 puffs into the lungs every 6 (six) hours as needed for wheezing.      [name] albuterol (PROVENTIL) (2.5 MG/3ML) 0.083% nebulizer solution Take 2.5 mg by nebulization every 4 (four) hours as needed for wheezing.      . Ascorbic Acid (VITAMIN C CR) 1000 MG TBCR Take 1 twice a week      . Black Cohosh 40 MG CAPS Take by mouth daily.      . calcitRIOL (ROCALTROL) 0.5 MCG capsule 0.137 mcg. 3 tablets twice a day      . calcium carbonate (TUMS - DOSED IN MG ELEMENTAL CALCIUM) 500 MG chewable tablet Chew 1 tablet by mouth 2 (two) times daily.      . cetirizine (ZYRTEC) 10 MG tablet Take 10 mg by mouth daily.      . Cyanocobalamin (VITAMIN B 12 PO) Take 1 tablet by mouth every Wednesday.       . desoximetasone (TOPICORT) 0.05 % cream Apply topically 2 (two) times daily.      [name] diltiazem (DILACOR XR) 240 MG 24 hr capsule Take 240 mg by mouth every evening.      . ergocalciferol (VITAMIN D2) 50000 UNITS capsule Take 50,000 Units by mouth every Saturday. Wednesday      . fluticasone  (FLONASE) 50 MCG/ACT nasal spray Place 2 sprays into the nose daily as needed for allergies.      . furosemide (LASIX) 40 MG tablet TAKE 1 TABLET BY MOUTH EVERY DAY  30 tablet  2  . hydrochlorothiazide (HYDRODIURIL) 25 MG tablet TAKE 1 TABLET BY MOUTH EVERY DAY  30 tablet  3  . iron polysaccharides (NU-IRON) 150 MG capsule Every thurs      . levothyroxine (SYNTHROID, LEVOTHROID) 137 MCG tablet Take 137 mcg by mouth every morning. 3 pills at once      . Magnesium 250 MG TABS Take 250 mg by mouth every other day.      . nystatin cream (MYCOSTATIN) Apply topically 2 (two) times daily. Apply to affected area BID for up to 7 days.  30 g  0  .  Omeprazole-Sodium Bicarbonate (ZEGERID) 20-1100 MG CAPS Take 1 capsule by mouth daily before breakfast.       . POTASSIUM CITRATE PO Take 99 mg by mouth daily.      . Rivaroxaban (XARELTO) 20 MG TABS Take 1 tablet (20 mg total) by mouth daily.  30 tablet  9  . SYMBICORT 160-4.5 MCG/ACT inhaler INHALE TWO PUFFS INTO LUNGS TWICE DAILY  11 g  3  . traMADol (ULTRAM) 50 MG tablet TAKE 1 TABLET BY MOUTH EVERY 6 HOURS  30 tablet  0  . valsartan (DIOVAN) 80 MG tablet TAKE 1 TABLET BY MOUTH EVERY DAY  30 tablet  0  . vitamin E (VITAMIN E) 1000 UNIT capsule Take 1,000 Units by mouth every morning.      . zafirlukast (ACCOLATE) 20 MG tablet Take 20 mg by mouth every morning.       No current facility-administered medications for this visit.    History   Social History  . Marital Status: Single    Spouse Name: N/A    Number of Children: 0  . Years of Education: N/A   Occupational History  . works in Medical laboratory scientific officer   . [employer]    Social History Main Topics  . Smoking status: Former Smoker -- 10 years    Quit date: 05/27/1995  . Smokeless tobacco: Never Used  . Alcohol Use: Yes     Comment: 1/2 glass  . Drug Use: No  . Sexual Activity: Yes    Partners: Male   Other Topics Concern  . 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  She was light smoker for 10 years.                 Family History  Problem Relation Age of Onset  . Hypertension Father   . Diabetes Father   . Lung cancer Father   . Heart attack Father     MI at age 59  . Hypertension Mother   . Hyperthyroidism Mother   . Heart disease Mother   . Heart attack Mother     MI at age 17  . Asthma Brother   . Hypertension Brother     younger  . Heart disease Brother     older      Jeannette Corpus, MD 01/11/2013, 12:47 PM

## 2013-01-18 ENCOUNTER — Other Ambulatory Visit (HOSPITAL_COMMUNITY): Payer: BC Managed Care – PPO

## 2013-01-18 ENCOUNTER — Ambulatory Visit (HOSPITAL_COMMUNITY)
Admission: RE | Admit: 2013-01-18 | Discharge: 2013-01-18 | Disposition: A | Discharge status: Home / Self Care | Payer: BC Managed Care – PPO | Source: Ambulatory Visit | Attending: Gynecologic Oncology | Admitting: Gynecologic Oncology

## 2013-01-18 ENCOUNTER — Ambulatory Visit (HOSPITAL_COMMUNITY): Admission: RE | Admit: 2013-01-18 | Payer: BC Managed Care – PPO | Source: Ambulatory Visit

## 2013-01-18 DIAGNOSIS — C541 Malignant neoplasm of endometrium: Secondary | ICD-10-CM

## 2013-01-18 DIAGNOSIS — C549 Malignant neoplasm of corpus uteri, unspecified: Secondary | ICD-10-CM | POA: Insufficient documentation

## 2013-01-18 LAB — POCT I-STAT, CHEM 8
BUN: 21 mg/dL (ref 6–23)
Creatinine, Ser: 0.9 mg/dL (ref 0.50–1.10)
Potassium: 3.2 mEq/L — ABNORMAL LOW (ref 3.5–5.1)
Sodium: 139 mEq/L (ref 135–145)
TCO2: 24 mmol/L (ref 0–100)

## 2013-01-19 ENCOUNTER — Telehealth: Payer: Self-pay | Admitting: Gynecologic Oncology

## 2013-01-19 NOTE — Telephone Encounter (Signed)
Patient informed of potassium results on lab work prior to MRI.  She is currently taking potassium citrate daily.  Advised to increase two one tablet twice daily for three days then back to once daily.  Verbalizing understanding.  Instructed to call for any needs.

## 2013-01-21 ENCOUNTER — Other Ambulatory Visit (HOSPITAL_BASED_OUTPATIENT_CLINIC_OR_DEPARTMENT_OTHER): Payer: Self-pay | Admitting: Rheumatology

## 2013-01-21 ENCOUNTER — Ambulatory Visit (HOSPITAL_BASED_OUTPATIENT_CLINIC_OR_DEPARTMENT_OTHER)
Admission: RE | Admit: 2013-01-21 | Discharge: 2013-01-21 | Disposition: A | Discharge status: Home / Self Care | Payer: BC Managed Care – PPO | Source: Ambulatory Visit | Attending: Rheumatology | Admitting: Rheumatology

## 2013-01-21 DIAGNOSIS — M3130 Wegener's granulomatosis without renal involvement: Secondary | ICD-10-CM

## 2013-01-21 DIAGNOSIS — C55 Malignant neoplasm of uterus, part unspecified: Secondary | ICD-10-CM | POA: Insufficient documentation

## 2013-01-21 DIAGNOSIS — I10 Essential (primary) hypertension: Secondary | ICD-10-CM | POA: Insufficient documentation

## 2013-01-21 DIAGNOSIS — D72829 Elevated white blood cell count, unspecified: Secondary | ICD-10-CM | POA: Insufficient documentation

## 2013-01-23 ENCOUNTER — Other Ambulatory Visit: Payer: Self-pay | Admitting: Gynecologic Oncology

## 2013-01-23 ENCOUNTER — Ambulatory Visit
Admission: RE | Admit: 2013-01-23 | Discharge: 2013-01-23 | Disposition: A | Discharge status: Home / Self Care | Payer: BC Managed Care – PPO | Source: Ambulatory Visit | Attending: Gynecologic Oncology | Admitting: Gynecologic Oncology

## 2013-01-23 DIAGNOSIS — R52 Pain, unspecified: Secondary | ICD-10-CM

## 2013-01-23 DIAGNOSIS — C541 Malignant neoplasm of endometrium: Secondary | ICD-10-CM

## 2013-01-26 ENCOUNTER — Other Ambulatory Visit: Payer: Self-pay | Admitting: Family

## 2013-01-29 ENCOUNTER — Other Ambulatory Visit: Payer: BC Managed Care – PPO

## 2013-01-29 ENCOUNTER — Ambulatory Visit (HOSPITAL_BASED_OUTPATIENT_CLINIC_OR_DEPARTMENT_OTHER)
Admission: RE | Admit: 2013-01-29 | Discharge: 2013-01-29 | Disposition: A | Discharge status: Home / Self Care | Payer: BC Managed Care – PPO | Source: Ambulatory Visit | Attending: Gynecologic Oncology | Admitting: Gynecologic Oncology

## 2013-01-29 MED ORDER — GADOBENATE DIMEGLUMINE 529 MG/ML IV SOLN
20.0000 mL | Freq: Once | INTRAVENOUS | Status: AC | PRN
  Administered 2013-01-29: 20 mL via INTRAVENOUS

## 2013-01-31 ENCOUNTER — Other Ambulatory Visit: Payer: Self-pay | Admitting: Family

## 2013-01-31 ENCOUNTER — Other Ambulatory Visit: Payer: Self-pay | Admitting: Gynecologic Oncology

## 2013-01-31 ENCOUNTER — Ambulatory Visit (HOSPITAL_BASED_OUTPATIENT_CLINIC_OR_DEPARTMENT_OTHER)
Admission: RE | Admit: 2013-01-31 | Discharge: 2013-01-31 | Disposition: A | Discharge status: Home / Self Care | Payer: BC Managed Care – PPO | Source: Ambulatory Visit | Attending: Gynecologic Oncology | Admitting: Gynecologic Oncology

## 2013-01-31 DIAGNOSIS — R52 Pain, unspecified: Secondary | ICD-10-CM

## 2013-01-31 DIAGNOSIS — C55 Malignant neoplasm of uterus, part unspecified: Secondary | ICD-10-CM | POA: Insufficient documentation

## 2013-01-31 DIAGNOSIS — R1032 Left lower quadrant pain: Secondary | ICD-10-CM | POA: Insufficient documentation

## 2013-01-31 NOTE — Telephone Encounter (Signed)
Please advise re: tramadol request. Last rx from [place] was 11/08/12, #30.

## 2013-01-31 NOTE — Telephone Encounter (Signed)
OK to send #30 with zero refills.  

## 2013-02-01 ENCOUNTER — Telehealth: Payer: Self-pay | Admitting: *Deleted

## 2013-02-01 ENCOUNTER — Telehealth: Payer: Self-pay | Admitting: Gynecologic Oncology

## 2013-02-01 MED ORDER — TRAMADOL HCL 50 MG PO TABS: ORAL_TABLET | ORAL | Status: DC

## 2013-02-01 NOTE — Telephone Encounter (Signed)
Patient notified of MRI results and Dr. Nelwyn Salisbury recommendations for IUD placement.  Dr. Rondel Baton office notified and records faxed.  Patient advised to call for any questions or concerns.

## 2013-02-01 NOTE — Telephone Encounter (Signed)
Call from [facility] at GYN/ONC, Dr Loree Fee has seen patient and now reviewed MRI results and recommends IUD insertion.  Notes to be faxed.

## 2013-02-02 ENCOUNTER — Other Ambulatory Visit: Payer: BC Managed Care – PPO

## 2013-02-02 NOTE — Telephone Encounter (Signed)
Please schedule.  I already sent this to [facility]ynn (couple of weeks ago) for precert because wanted to know if would be covered.  She has this information.

## 2013-02-07 NOTE — Telephone Encounter (Signed)
Patient calling kelly to schedule a procedure.

## 2013-02-08 ENCOUNTER — Ambulatory Visit: Payer: BC Managed Care – PPO | Admitting: Family

## 2013-02-08 NOTE — Telephone Encounter (Signed)
Call to patient and discussed scheduling IUD appointment. She states she needs to speak with Dr Hyacinth Meeker BEFORE insertion as they have not really explained this to her.  Brief explanation of Mirena device and insertion procedure given but patient is at work and can not ask questions.  Appt scheduled for 02-17-13 since she needs AM appt.  Inst to take Motrin 800 mg one hour prior with food.  Patient is on Xarelto, is this ok for procedure and to still take Motrin?

## 2013-02-08 NOTE — Telephone Encounter (Signed)
Patient is G0, Do I need Cytotec?

## 2013-02-11 ENCOUNTER — Telehealth: Payer: Self-pay | Admitting: Family

## 2013-02-11 ENCOUNTER — Other Ambulatory Visit: Payer: Self-pay | Admitting: Otolaryngology

## 2013-02-11 DIAGNOSIS — K140 Glossitis: Secondary | ICD-10-CM

## 2013-02-11 NOTE — Telephone Encounter (Signed)
Received call from Dr. Lazarus Salines.  He reports pt's tongue is "purple and shiny."  Requests that we check b12 level. Please contact pt and have her come to our lab for b12 and folate- dx is glossitis. Thanks.

## 2013-02-12 NOTE — Telephone Encounter (Signed)
Spoke with patient.  Reviewed MRI with her.  Had not been reviewed with her by gyn/onc.  D/w pt that I had communicated with Dr. Stanford Breed regarding possible D&C first due to 10mm endometrium.  Although no data suggests this will improve course of disease, he does feel pt will benefit from [facility] and decreasing tumor size.  Also, pt will have much less spotting from IUD after D&C.  D/W pt procedure and risks including but not limited to bleeding, infection, rare risk of transfusion, 1% uterine perforation risk with rare risk of bowel, bladder, ureteral injury, and possible need for future procedures.  Pt voices clear understanding.  All questions answered.   Would like to proceed with D&C next Friday if possible.  (9/26)  First case if possible.  Thanks.

## 2013-02-14 ENCOUNTER — Telehealth: Payer: Self-pay | Admitting: Cardiology

## 2013-02-14 ENCOUNTER — Other Ambulatory Visit: Payer: Self-pay | Admitting: Family Medicine

## 2013-02-14 DIAGNOSIS — K140 Glossitis: Secondary | ICD-10-CM

## 2013-02-14 NOTE — Telephone Encounter (Signed)
After review with Dr Hyacinth Meeker, call placed to Dr Ludwig Clarks office to check on Xarelto and surgery.  Dr Jens Som at hosp, ok to call back tomm.

## 2013-02-14 NOTE — Telephone Encounter (Signed)
Will forward for dr crenshaw review  

## 2013-02-14 NOTE — Telephone Encounter (Signed)
Future orders placed in the system.

## 2013-02-14 NOTE — Telephone Encounter (Signed)
DC xeralto 2 days prior to procedure and resume day after [name]  

## 2013-02-14 NOTE — Telephone Encounter (Signed)
New problem  Pt is having sx [facility] 02/21/13 and need to know if pt should be off Xarelto and if so for how long. She has had today's dose. Please advise.

## 2013-02-14 NOTE — Telephone Encounter (Signed)
Patient notified of D&C scheduled for 02-21-13 at 1100 at [facility]. Instructions give and post op appt scheduled.  Patient is on Xarelto and has taken today's dose.

## 2013-02-14 NOTE — Telephone Encounter (Signed)
Left message on home/cell for the pt to return my call. 

## 2013-02-14 NOTE — Telephone Encounter (Signed)
Patient notified.  Transferred to make flu injection appt.

## 2013-02-15 ENCOUNTER — Ambulatory Visit (INDEPENDENT_AMBULATORY_CARE_PROVIDER_SITE_OTHER): Payer: BC Managed Care – PPO | Admitting: *Deleted

## 2013-02-15 ENCOUNTER — Encounter (HOSPITAL_COMMUNITY): Payer: Self-pay | Admitting: Pharmacist

## 2013-02-15 DIAGNOSIS — Z23 Encounter for immunization: Secondary | ICD-10-CM

## 2013-02-15 LAB — VITAMIN B12: Vitamin B-12: 325 pg/mL (ref 211–911)

## 2013-02-15 NOTE — Addendum Note (Signed)
Addended by: Mervin Kung A on: 02/15/2013 09:35 AM   Modules accepted: Orders

## 2013-02-15 NOTE — Telephone Encounter (Signed)
Orders re-entered as lab collect, [facility].

## 2013-02-15 NOTE — Addendum Note (Signed)
Addended by: Mervin Kung A on: 02/15/2013 09:49 AM   Modules accepted: Orders

## 2013-02-15 NOTE — Progress Notes (Signed)
Pt presented for her B12 / folate levels. States kidney specialist started her on potassium supplements due to potassium being low. Has been on supplement x 2 months and wants to know if we can add a potassium level to today's labs.  Please advise.

## 2013-02-15 NOTE — Telephone Encounter (Signed)
Patient notified of change in surgery time to 0730 on 02-21-13.  Has PAT appt tomorrow at [facility].    Hospital called for orders.  Did Dr Jens Som call you back or should I place another call tomorrow?

## 2013-02-16 ENCOUNTER — Encounter (HOSPITAL_COMMUNITY)
Admission: RE | Admit: 2013-02-16 | Discharge: 2013-02-16 | Disposition: A | Discharge status: Home / Self Care | Payer: BC Managed Care – PPO | Source: Ambulatory Visit | Attending: Obstetrics & Gynecology | Admitting: Obstetrics & Gynecology

## 2013-02-16 ENCOUNTER — Encounter (HOSPITAL_COMMUNITY): Payer: Self-pay

## 2013-02-16 ENCOUNTER — Encounter (HOSPITAL_COMMUNITY): Payer: Self-pay | Admitting: Pharmacy Technician

## 2013-02-16 ENCOUNTER — Telehealth: Payer: Self-pay | Admitting: *Deleted

## 2013-02-16 DIAGNOSIS — Z01818 Encounter for other preprocedural examination: Secondary | ICD-10-CM | POA: Insufficient documentation

## 2013-02-16 DIAGNOSIS — Z01812 Encounter for preprocedural laboratory examination: Secondary | ICD-10-CM | POA: Insufficient documentation

## 2013-02-16 HISTORY — DX: Localized edema: R60.0

## 2013-02-16 HISTORY — DX: Other bursitis of hip, unspecified hip: M70.70

## 2013-02-16 HISTORY — DX: Fibromyalgia: M79.7

## 2013-02-16 HISTORY — DX: Unspecified convulsions: R56.9

## 2013-02-16 LAB — BASIC METABOLIC PANEL
BUN: 17 mg/dL (ref 6–23)
CO2: 27 mEq/L (ref 19–32)
Calcium: 8.9 mg/dL (ref 8.4–10.5)
Chloride: 95 mEq/L — ABNORMAL LOW (ref 96–112)
GFR calc non Af Amer: >90 mL/min (ref >90)
Glucose, Bld: 92 mg/dL (ref 70–99)
Sodium: 135 mEq/L (ref 135–145)

## 2013-02-16 LAB — CBC
Hemoglobin: 10.8 g/dL — ABNORMAL LOW (ref 12.0–15.0)
MCH: 23.4 pg — ABNORMAL LOW (ref 26.0–34.0)
MCV: 75.8 fL — ABNORMAL LOW (ref 78.0–100.0)
RBC: 4.62 MIL/uL (ref 3.87–5.11)

## 2013-02-16 NOTE — Telephone Encounter (Signed)
Orders done

## 2013-02-16 NOTE — Telephone Encounter (Signed)
Spoke with sally, aware of dr crenhsaw recommendations. This telephone note will be faxed to [phone].

## 2013-02-16 NOTE — Patient Instructions (Addendum)
Your procedure is scheduled on: 02/21/2013  Enter through the Main Entrance of [facility] at: 0600am  Pick up the phone at the desk and dial [phone].  Call this number if you have problems the morning of surgery: [phone].  Remember: Do NOT eat food: after midnight 02/20/2013 Do NOT drink clear liquids after:  after midnight 02/20/2013 Take these medicines the morning of surgery with a SIP OF WATER: Diovan, Synthroid, Hydrochlorothiazide, Omeprazole *Bring your inhalers with you the day of surgery  Do NOT wear jewelry, make-up, or nail polish. Do NOT wear lotions, powders, or perfumes.  You may wear deordrant. Do NOT shave for 48 hours prior to surgery. Do NOT bring valuables to the hospital. Contacts, dentures, or bridgework may not be worn into surgery. Have someone drive you home from surgery and stay with you for the first 24 hours after the procedure

## 2013-02-16 NOTE — Telephone Encounter (Signed)
Debra from dr Jens Som calling to notify us that Xarelto should be held for two days prior and restart the following day.  She will fax note.

## 2013-02-17 ENCOUNTER — Encounter: Payer: Self-pay | Admitting: Family

## 2013-02-17 ENCOUNTER — Ambulatory Visit: Payer: Self-pay | Admitting: Obstetrics & Gynecology

## 2013-02-18 ENCOUNTER — Ambulatory Visit: Payer: BC Managed Care – PPO

## 2013-02-18 NOTE — Telephone Encounter (Signed)
Is pt aware to stop Sat and Sun?

## 2013-02-18 NOTE — Telephone Encounter (Signed)
Patient is aware, notified this am to stop after today and restart on Tuesday, day following procedure. Patient agreeable.

## 2013-02-20 MED ORDER — METRONIDAZOLE IN NACL 5-0.79 MG/ML-% IV SOLN
500.0000 mg | Freq: Once | INTRAVENOUS | Status: AC
  Administered 2013-02-21: 500 mg via INTRAVENOUS
  Filled 2013-02-20: qty 100

## 2013-02-20 MED ORDER — METRONIDAZOLE IN NACL 5-0.79 MG/ML-% IV SOLN
500.0000 mg | Freq: Once | INTRAVENOUS | Status: DC
  Filled 2013-02-20: qty 100

## 2013-02-21 ENCOUNTER — Ambulatory Visit (HOSPITAL_COMMUNITY)
Admission: RE | Admit: 2013-02-21 | Discharge: 2013-02-21 | Disposition: A | Discharge status: Home / Self Care | Payer: BC Managed Care – PPO | Source: Ambulatory Visit | Attending: Obstetrics & Gynecology | Admitting: Obstetrics & Gynecology

## 2013-02-21 ENCOUNTER — Encounter (HOSPITAL_COMMUNITY): Payer: Self-pay | Admitting: *Deleted

## 2013-02-21 ENCOUNTER — Encounter (HOSPITAL_COMMUNITY)
Admission: RE | Disposition: A | Discharge status: Home / Self Care | Payer: Self-pay | Source: Ambulatory Visit | Attending: Obstetrics & Gynecology

## 2013-02-21 ENCOUNTER — Encounter (HOSPITAL_COMMUNITY): Payer: Self-pay | Admitting: Anesthesiology

## 2013-02-21 ENCOUNTER — Ambulatory Visit (HOSPITAL_COMMUNITY): Payer: BC Managed Care – PPO | Admitting: Anesthesiology

## 2013-02-21 DIAGNOSIS — N950 Postmenopausal bleeding: Secondary | ICD-10-CM

## 2013-02-21 DIAGNOSIS — C541 Malignant neoplasm of endometrium: Secondary | ICD-10-CM | POA: Diagnosis present

## 2013-02-21 DIAGNOSIS — C549 Malignant neoplasm of corpus uteri, unspecified: Secondary | ICD-10-CM | POA: Insufficient documentation

## 2013-02-21 DIAGNOSIS — I10 Essential (primary) hypertension: Secondary | ICD-10-CM | POA: Insufficient documentation

## 2013-02-21 HISTORY — PX: DILATION AND CURETTAGE OF UTERUS: SHX78

## 2013-02-21 HISTORY — DX: Malignant neoplasm of endometrium: C54.1

## 2013-02-21 SURGERY — DILATION AND CURETTAGE
Anesthesia: Choice | Site: Vagina | Wound class: Clean Contaminated

## 2013-02-21 MED ORDER — FENTANYL CITRATE 0.05 MG/ML IJ SOLN
INTRAMUSCULAR | Status: AC
  Filled 2013-02-21: qty 2

## 2013-02-21 MED ORDER — PROPOFOL 10 MG/ML IV EMUL
INTRAVENOUS | Status: AC
  Filled 2013-02-21: qty 20

## 2013-02-21 MED ORDER — KETOROLAC TROMETHAMINE 30 MG/ML IJ SOLN
INTRAMUSCULAR | Status: DC | PRN
  Administered 2013-02-21: 30 mg via INTRAVENOUS

## 2013-02-21 MED ORDER — LIDOCAINE-EPINEPHRINE 1 %-1:100000 IJ SOLN
INTRAMUSCULAR | Status: DC | PRN
  Administered 2013-02-21: 10 mL

## 2013-02-21 MED ORDER — MIDAZOLAM HCL 2 MG/2ML IJ SOLN
INTRAMUSCULAR | Status: DC | PRN
  Administered 2013-02-21: 2 mg via INTRAVENOUS

## 2013-02-21 MED ORDER — FENTANYL CITRATE 0.05 MG/ML IJ SOLN: 25.0000 ug | INTRAMUSCULAR | Status: DC | PRN

## 2013-02-21 MED ORDER — ONDANSETRON HCL 4 MG/2ML IJ SOLN
INTRAMUSCULAR | Status: DC | PRN
  Administered 2013-02-21: 4 mg via INTRAVENOUS

## 2013-02-21 MED ORDER — LIDOCAINE HCL (CARDIAC) 20 MG/ML IV SOLN
INTRAVENOUS | Status: AC
  Filled 2013-02-21: qty 5

## 2013-02-21 MED ORDER — MIDAZOLAM HCL 2 MG/2ML IJ SOLN
INTRAMUSCULAR | Status: AC
  Filled 2013-02-21: qty 2

## 2013-02-21 MED ORDER — SCOPOLAMINE 1 MG/3DAYS TD PT72
MEDICATED_PATCH | TRANSDERMAL | Status: AC
  Administered 2013-02-21: 1.5 mg via TRANSDERMAL
  Filled 2013-02-21: qty 1

## 2013-02-21 MED ORDER — HYDROCODONE-ACETAMINOPHEN 5-300 MG PO TABS: 1.0000 | ORAL_TABLET | Freq: Four times a day (QID) | ORAL | Status: DC

## 2013-02-21 MED ORDER — METOCLOPRAMIDE HCL 5 MG/ML IJ SOLN: 10.0000 mg | Freq: Once | INTRAMUSCULAR | Status: DC | PRN

## 2013-02-21 MED ORDER — FENTANYL CITRATE 0.05 MG/ML IJ SOLN
INTRAMUSCULAR | Status: DC | PRN
  Administered 2013-02-21: 100 ug via INTRAVENOUS

## 2013-02-21 MED ORDER — ONDANSETRON HCL 4 MG/2ML IJ SOLN
INTRAMUSCULAR | Status: AC
  Filled 2013-02-21: qty 2

## 2013-02-21 MED ORDER — SCOPOLAMINE 1 MG/3DAYS TD PT72
1.0000 | MEDICATED_PATCH | TRANSDERMAL | Status: DC
  Administered 2013-02-21: 1.5 mg via TRANSDERMAL

## 2013-02-21 MED ORDER — METOCLOPRAMIDE HCL 5 MG/ML IJ SOLN
INTRAMUSCULAR | Status: AC
  Filled 2013-02-21: qty 2

## 2013-02-21 MED ORDER — LACTATED RINGERS IV SOLN
INTRAVENOUS | Status: DC
  Administered 2013-02-21: 06:00:00 via INTRAVENOUS

## 2013-02-21 MED ORDER — LIDOCAINE HCL (CARDIAC) 20 MG/ML IV SOLN
INTRAVENOUS | Status: DC | PRN
  Administered 2013-02-21: 100 mg via INTRAVENOUS

## 2013-02-21 MED ORDER — PROPOFOL 10 MG/ML IV BOLUS
INTRAVENOUS | Status: DC | PRN
  Administered 2013-02-21: 200 mg via INTRAVENOUS

## 2013-02-21 MED ORDER — KETOROLAC TROMETHAMINE 30 MG/ML IJ SOLN
INTRAMUSCULAR | Status: AC
  Filled 2013-02-21: qty 1

## 2013-02-21 MED ORDER — METOCLOPRAMIDE HCL 5 MG/ML IJ SOLN
INTRAMUSCULAR | Status: DC | PRN
  Administered 2013-02-21: 10 mg via INTRAVENOUS

## 2013-02-21 SURGICAL SUPPLY — 17 items
CATH ROBINSON RED A/P 16FR (CATHETERS) ×2 IMPLANT
CLOTH BEACON ORANGE TIMEOUT ST (SAFETY) ×2 IMPLANT
CONTAINER PREFILL 10% NBF 60ML (FORM) ×4 IMPLANT
DRESSING TELFA 8X3 (GAUZE/BANDAGES/DRESSINGS) ×2 IMPLANT
GLOVE BIOGEL PI IND STRL 7.0 (GLOVE) ×1 IMPLANT
GLOVE BIOGEL PI INDICATOR 7.0 (GLOVE) ×1
GLOVE ECLIPSE 6.5 STRL STRAW (GLOVE) ×4 IMPLANT
GOWN STRL REIN XL XLG (GOWN DISPOSABLE) ×4 IMPLANT
NDL SPNL 22GX3.5 QUINCKE BK (NEEDLE) ×1 IMPLANT
NEEDLE HYPO 22GX1.5 SAFETY (NEEDLE) ×2 IMPLANT
NEEDLE SPNL 22GX3.5 QUINCKE BK (NEEDLE) ×2 IMPLANT
PACK VAGINAL MINOR WOMEN LF (CUSTOM PROCEDURE TRAY) ×2 IMPLANT
PAD OB MATERNITY 4.3X12.25 (PERSONAL CARE ITEMS) ×2 IMPLANT
PAD PREP 24X48 CUFFED NSTRL (MISCELLANEOUS) ×2 IMPLANT
SYR CONTROL 10ML LL (SYRINGE) ×2 IMPLANT
TOWEL OR 17X24 6PK STRL BLUE (TOWEL DISPOSABLE) ×4 IMPLANT
WATER STERILE IRR 1000ML POUR (IV SOLUTION) ×2 IMPLANT

## 2013-02-21 NOTE — Op Note (Signed)
02/21/2013  8:36 AM  PATIENT:  [name]  57 y.o. female  PRE-OPERATIVE DIAGNOSIS:  new diagnosis endometrial cancer, PMP bleeding  POST-OPERATIVE DIAGNOSIS: same  PROCEDURE:  Procedure(s): DILATATION AND CURETTAGE  SURGEON:  Aubert Choyce SUZANNE  ASSISTANTS: OR staff   ANESTHESIA:   LMA, Dr. Malen Gauze oversaw case  ESTIMATED BLOOD LOSS: * No blood loss amount entered *  BLOOD ADMINISTERED:none   FLUIDS: 1000ccLR  UOP: 50cc drained with I&O cath at beginning of procedure  SPECIMEN:  Endometrial curettings  DISPOSITION OF SPECIMEN:  PATHOLOGY  FINDINGS: Dark old blood with curettings at time of D&C  DESCRIPTION OF OPERATION: Patient was taken to the operating room.  She is placed in the supine position. SCDs were on her lower extremities and functioning properly. General anesthesia with an LMA was administered without difficulty. Dr. Malen Gauze oversaw case.  Legs were then placed in the [facility] stirrups in the low lithotomy position. The legs were lifted to the high lithotomy position and the Betadine prep was used on the inner thighs perineum and vagina x3. Patient was draped in a normal standard fashion. An in and out catheterization with a red rubber Foley catheter was performed. Approximately 50 cc of clear urine was noted. A bivalve speculum was placed the vagina. The anterior lip of the cervix was grasped with single-tooth tenaculum.  A paracervical block of 1% lidocaine mixed one-to-one with epinephrine (1:100,000 units).  10 cc was used total. The cervix is dilated up to #231 [facility] dilators. The endometrial cavity sounded to 8.5 cm.  Both a #1 toothed and smooth curette were used to curette the cavity until rough gritty texture was noted in all quadrants. Curettings were obtained and sent to pathology.  The tenaculum was removed from the anterior lip of the cervix. The speculum was removed from the vagina.  The legs are positioned back in the supine position. The prep was  cleansed of the patient's skin.  Sponge, lap, needle, initially counts were correct x2. Patient tolerated the procedure well and was awakened and taken to recovery in stable condition.  COUNTS:  YES  PLAN OF CARE: Transfer to PACU

## 2013-02-21 NOTE — H&P (Signed)
[name] is an 57 y.o. female G0 SWF here for D&C with known endometrial cancer.  Endometrium is >1cm adn Dr. Stanford Breed and I discussed D&C to minimized tumor size before IUD placement.  Will plan this next week in office.  Risks and benefits discussed with pt particularly 1% perforation risk, infection, and bleeding.  She knows to expect continued spotting.  Pertinent Gynecological History: Menses: post-menopausal Bleeding: post menopausal bleeding Contraception: post menopausal status DES exposure: denies Blood transfusions: due to anemia, 2 units Sexually transmitted diseases: no past history Previous GYN Procedures: enodmetrial biopsy in office  Last pap: normal Date: 2014 OB History: G0, P0   Menstrual History: Patient's last menstrual period was 05/26/2009.    Past Medical History  Diagnosis Date  . Allergic rhinitis   . Asthma   . Hypertension   . Hypothyroidism   . Anemia, iron deficiency   . Vitamin D deficiency   . Thyroid cancer   . Morbid obesity   . GERD (gastroesophageal reflux disease)   . Hypoparathyroidism 01/07/2011  . Arthritis   . Fatty liver 01/07/2012  . Headache(784.0)     occasional sinus headache   . Atrial fibrillation August 2012  . Chronic kidney disease     kidnsy stones  . PONV (postoperative nausea and vomiting)   . History of blood transfusion   . Fibroid 1974    fibroid cyst on left fallopian tube  . Abnormal Pap smear     years ago/no biopsy  . Nephrolithiasis 2012  . Bursitis of hip left  . Edema of both legs   . Seizures     infancy secondary to fever  . Fibromyalgia   . Enlarged liver     Past Surgical History  Procedure Laterality Date  . Tonsillectomy and adenoidectomy    . Bunionectomy      bilateral  . Appendectomy    . Gastric bypass  1974  . Thyroidectomy  05/15/2010  . Cyst on ovary removed     . Ureteroscopy  03/03/2012    Procedure: URETEROSCOPY;  Surgeon: Valetta Fuller, MD;  Location: [facility];   Service: Urology;  Laterality: Left;  . Cystoscopy w/ retrogrades  03/03/2012    Procedure: CYSTOSCOPY WITH RETROGRADE PYELOGRAM;  Surgeon: Valetta Fuller, MD;  Location: [facility];  Service: Urology;  Laterality: Bilateral;  . Lithotripsy  03/2012  . Cystoscopy with retrograde pyelogram, ureteroscopy and stent placement Left 08/25/2012    Procedure: CYSTOSCOPY WITH RETROGRADE PYELOGRAM, URETEROSCOPY;  Surgeon: Valetta Fuller, MD;  Location: [facility];  Service: Urology;  Laterality: Left;  . Holmium laser application Left 08/25/2012    Procedure: HOLMIUM LASER APPLICATION;  Surgeon: Valetta Fuller, MD;  Location: [facility];  Service: Urology;  Laterality: Left;    Family History  Problem Relation Age of Onset  . Hypertension Father   . Diabetes Father   . Lung cancer Father   . Heart attack Father     MI at age 66  . Hypertension Mother   . Hyperthyroidism Mother   . Heart disease Mother   . Heart attack Mother     MI at age 5  . Asthma Brother   . Hypertension Brother     younger  . Heart disease Brother     older    Social History:  reports that she quit smoking about 17 years ago. She has never used smokeless tobacco. She reports that  drinks alcohol. She reports that she does  not use illicit drugs.  Allergies:  Allergies  Allergen Reactions  . Amoxicillin-Pot Clavulanate Other (See Comments)    headache  . Penicillins Other (See Comments)    headache    Prescriptions prior to admission  Medication Sig Dispense Refill  . diltiazem (DILACOR XR) 240 MG 24 hr capsule Take 240 mg by mouth every evening.      . hydrochlorothiazide (HYDRODIURIL) 25 MG tablet Take 25 mg by mouth daily.      [name] levothyroxine (SYNTHROID, LEVOTHROID) 137 MCG tablet Take 411 mcg by mouth every morning.       Maxwell Caul Bicarbonate (ZEGERID) 20-1100 MG CAPS Take 1 capsule by mouth daily before breakfast.       . valsartan (DIOVAN) 80 MG tablet Take 1 tablet (80 mg total) by mouth daily.  30 tablet  5   . albuterol (PROVENTIL HFA;VENTOLIN HFA) 108 (90 BASE) MCG/ACT inhaler Inhale 2 puffs into the lungs every 6 (six) hours as needed for wheezing.      [name] albuterol (PROVENTIL) (2.5 MG/3ML) 0.083% nebulizer solution Take 2.5 mg by nebulization every 4 (four) hours as needed for wheezing.      . Ascorbic Acid (VITAMIN C CR) 1000 MG TBCR Take 1 tablet by mouth 2 (two) times a week. On Tuesday and Thursday.      . Black Cohosh 40 MG CAPS Take 1 capsule by mouth daily.       . budesonide-formoterol (SYMBICORT) 160-4.5 MCG/ACT inhaler Inhale 2 puffs into the lungs 2 (two) times daily.      . calcitRIOL (ROCALTROL) 0.5 MCG capsule Take 0.411 mcg by mouth 2 (two) times daily.       . calcium carbonate (TUMS - DOSED IN MG ELEMENTAL CALCIUM) 500 MG chewable tablet Chew 1 tablet by mouth daily.       [name] CARTIA XT 240 MG 24 hr capsule Take 240 mg by mouth daily.       . cetirizine (ZYRTEC) 10 MG tablet Take 10 mg by mouth daily.      . ergocalciferol (VITAMIN D2) 50000 UNITS capsule Take 50,000 Units by mouth 2 (two) times a week. On Saturday and Wednesday      . fluticasone (FLONASE) 50 MCG/ACT nasal spray Place 2 sprays into the nose daily.       . furosemide (LASIX) 40 MG tablet Take 40 mg by mouth daily.      . iron polysaccharides (NU-IRON) 150 MG capsule Take 150 mg by mouth 2 (two) times a week. Every Saturday and Wednesday      . Magnesium 250 MG TABS Take 25 mg by mouth once a week.       [name] POTASSIUM CITRATE PO Take 99 mg by mouth daily.      [name] PRESCRIPTION MEDICATION Apply 1-2 sprays topically 4 (four) times daily. Compounded Medication:   Diclofenac 3%/Baclofen 2%/Bupivacaine 1%/Gabapentin 6%/Ibuprofen 3%/Pentoxyfylline 3%/Ethoxydiglycol 4%/Ethyl Alcohol 2%/Versatile Base Cream 70%      . Rivaroxaban (XARELTO) 20 MG TABS Take 1 tablet (20 mg total) by mouth daily.  30 tablet  9  . traMADol (ULTRAM) 50 MG tablet Take 50 mg by mouth every 6 (six) hours as needed for pain.      . vitamin B-12  (CYANOCOBALAMIN) 1000 MCG tablet Take 1,000 mcg by mouth 2 (two) times a week. On Tuesday and Thursday with Iron.      . vitamin E (VITAMIN E) 1000 UNIT capsule Take 1,000 Units by mouth every morning.      [name]  zafirlukast (ACCOLATE) 20 MG tablet Take 1 tablet (20 mg total) by mouth 2 (two) times daily.  60 tablet  5    ROS  Blood pressure 134/61, pulse 86, temperature 98.4 F (36.9 C), temperature source Oral, resp. rate 20, last menstrual period 05/26/2009, SpO2 97.00%. Physical Exam  Constitutional: She is oriented to person, place, and time. She appears well-developed and well-nourished.  Cardiovascular: Normal rate and regular rhythm.   Respiratory: Effort normal and breath sounds normal.  GI: Soft. Bowel sounds are normal.  Neurological: She is alert and oriented to person, place, and time.  Skin: Skin is warm and dry.  Psychiatric: She has a normal mood and affect.    No results found for this or any previous visit (from the past 24 hour(s)).  No results found.  Assessment/Plan: 57 yo G0 with endometrial cancer here for D&C.  All questions answered.  [name] is to be called when pt is in recovery room.  Valentina Shaggy [facility] 02/21/2013, 7:16 AM

## 2013-02-21 NOTE — Transfer of Care (Signed)
Immediate Anesthesia Transfer of Care Note  Patient: [name]  Procedure(s) Performed: Procedure(s): DILATATION AND CURETTAGE (N/A)  Patient Location: PACU  Anesthesia Type:General  Level of Consciousness: awake, alert  and oriented  Airway & Oxygen Therapy: Patient Spontanous Breathing  Post-op Assessment: Report given to PACU RN and Post -op Vital signs reviewed and stable  Post vital signs: Reviewed and stable  Complications: No apparent anesthesia complications

## 2013-02-21 NOTE — Anesthesia Procedure Notes (Signed)
Procedure Name: LMA Insertion Date/Time: 02/21/2013 7:38 AM Performed by: Chelbi Herber, Jannet Askew Pre-anesthesia Checklist: Patient identified, Patient being monitored, Emergency Drugs available, Timeout performed and Suction available Patient Re-evaluated:Patient Re-evaluated prior to inductionOxygen Delivery Method: Circle system utilized Preoxygenation: Pre-oxygenation with 100% oxygen Intubation Type: IV induction LMA: LMA inserted LMA Size: 4.0 Number of attempts: 1 Dental Injury: Teeth and Oropharynx as per pre-operative assessment

## 2013-02-21 NOTE — Anesthesia Postprocedure Evaluation (Signed)
  Anesthesia Post-op Note  Patient: [name]  Procedure(s) Performed: Procedure(s): DILATATION AND CURETTAGE (N/A)  Patient Location: PACU  Anesthesia Type:General  Level of Consciousness: awake, alert  and oriented  Airway and Oxygen Therapy: Patient Spontanous Breathing  Post-op Pain: none  Post-op Assessment: Post-op Vital signs reviewed, Patient's Cardiovascular Status Stable, Respiratory Function Stable, Patent Airway, No signs of Nausea or vomiting and Pain level controlled  Post-op Vital Signs: Reviewed and stable  Complications: No apparent anesthesia complications

## 2013-02-21 NOTE — Anesthesia Preprocedure Evaluation (Addendum)
Anesthesia Evaluation  Patient identified by MRN, date of birth, ID band Patient awake    Reviewed: Allergy & Precautions, H&P , NPO status , Patient's Chart, lab work & pertinent test results  History of Anesthesia Complications (+) PONV  Airway Mallampati: II TM Distance: >3 FB Neck ROM: full    Dental no notable dental hx. (+) Teeth Intact and Dental Advisory Given   Pulmonary asthma ,  breath sounds clear to auscultation  Pulmonary exam normal       Cardiovascular hypertension, Pt. on medications + dysrhythmias Atrial Fibrillation Rhythm:regular Rate:Normal  AF 8/12.   Neuro/Psych  Headaches, Seizures -,  negative psych ROS   GI/Hepatic Neg liver ROS, GERD-  Medicated and Controlled,  Endo/Other  Hypothyroidism Morbid obesityThyroid Ca.  hypoparathyroidism  Renal/GU Renal diseasenegative Renal ROS     Musculoskeletal  (+) Fibromyalgia -  Abdominal (+) + obese,   Peds  Hematology negative hematology ROS (+)   Anesthesia Other Findings   Reproductive/Obstetrics negative OB ROS                          Anesthesia Physical  Anesthesia Plan  ASA: III  Anesthesia Plan:    Post-op Pain Management:    Induction: Intravenous  Airway Management Planned: LMA  Additional Equipment:   Intra-op Plan:   Post-operative Plan:   Informed Consent: I have reviewed the patients History and Physical, chart, labs and discussed the procedure including the risks, benefits and alternatives for the proposed anesthesia with the patient or authorized representative who has indicated his/her understanding and acceptance.   Dental Advisory Given  Plan Discussed with: CRNA and Surgeon  Anesthesia Plan Comments:       Anesthesia Quick Evaluation

## 2013-02-22 ENCOUNTER — Encounter (HOSPITAL_COMMUNITY): Payer: Self-pay | Admitting: Obstetrics & Gynecology

## 2013-02-28 ENCOUNTER — Ambulatory Visit (INDEPENDENT_AMBULATORY_CARE_PROVIDER_SITE_OTHER): Payer: BC Managed Care – PPO | Admitting: Obstetrics & Gynecology

## 2013-02-28 VITALS — BP 142/80 | HR 80 | Resp 16 | Ht 65.0 in | Wt 305.4 lb

## 2013-02-28 DIAGNOSIS — N8502 Endometrial intraepithelial neoplasia [EIN]: Secondary | ICD-10-CM

## 2013-02-28 MED ORDER — ESTRADIOL 0.1 MG/GM VA CREA: TOPICAL_CREAM | VAGINAL | Status: DC

## 2013-02-28 NOTE — Patient Instructions (Signed)
IUD Post-procedure Instructions . Cramping is common.  You may take Ibuprofen, Aleve, or Tylenol for the cramping.  This should resolve within 24 hours.   . You may have a small amount of spotting.  You should wear a mini pad for the next few days. . You may have intercourse in 24 hours. . You need to call the office if you have any pelvic pain, fever, heavy bleeding, or foul smelling vaginal discharge. . Shower or bathe as normal  

## 2013-02-28 NOTE — Progress Notes (Signed)
56 yrs SingleCaucasianfemale presents for  insertion of Mirena due to new diagnosis of complex endometrial hyperplasia and endometrial cancer.  Diagnosed initially on endometrial biopsy after presenting with PMP bleeding/discharge.  Pt saw Dr. Stanford Breed in consultation who recommended Mirena IUD due to complex medical history and morbid obesity. Having vaginal/vulvar burning since procedure.  Advised pt day of D&C that she could restart estrogen cream externally for atrophic symptoms but she hasn't done that yet.  Needs Rx as well since samples are running low.    LMP:  PMP  Patient read information regarding IUD insertion.  All questions addressed.  Consent obtained.   Procedure:  Speculum inserted into vagina. Cervix visualized and cleansed with betadine solution X 3. Tenaculum placed on cervix at 12 o'clock position(s).  Uterus sounded to 8 centimeters.  IUD removed from sterile packet and under sterile conditions inserted to fundus of uterus.  Introducer removed without difficulty.  IUD string trimmed to 2 centimeters.  Remainder string given to patient to feel for identification.  Tenaculum removed.  Minimal bleeding noted.  Speculum removed.  Uterus palpated normal.  Patient tolerated procedure well.  A: Insertion of Mirena   P:  Instructions and warnings signs given.       IUD identification card given with IUD removal February 28, 2013       Return visit will be scheduled based upon Dr. Nelwyn Salisbury recommendation.

## 2013-03-06 ENCOUNTER — Other Ambulatory Visit: Payer: Self-pay | Admitting: Family

## 2013-03-07 ENCOUNTER — Ambulatory Visit: Payer: Self-pay | Admitting: Obstetrics & Gynecology

## 2013-03-07 NOTE — Telephone Encounter (Signed)
OK to fill for 30 no refills.

## 2013-03-07 NOTE — Telephone Encounter (Signed)
eScribe request for refill on Tramadol Last filled - 09.09.14, #30x0 [Hx for: Inpatient Standard 09.23.14] Last AEX - 06.18.14 Next AEX - 3 Months, canceled 09.16.14 & 09.26.14 appts; No future appts scheduled Please Advise/SLS

## 2013-03-07 NOTE — Telephone Encounter (Signed)
Please advise re: tramadol refill. Last refill 02/01/13, #30.  Pt has follow up in April.

## 2013-03-09 NOTE — Telephone Encounter (Signed)
Rx faxed to pharmacy/SLS 

## 2013-03-09 NOTE — Telephone Encounter (Signed)
Rx print for provider signature/SLS

## 2013-03-13 ENCOUNTER — Other Ambulatory Visit: Payer: Self-pay | Admitting: Family

## 2013-03-24 ENCOUNTER — Other Ambulatory Visit: Payer: Self-pay | Admitting: Internal Medicine

## 2013-03-24 DIAGNOSIS — C73 Malignant neoplasm of thyroid gland: Secondary | ICD-10-CM

## 2013-03-28 ENCOUNTER — Ambulatory Visit (INDEPENDENT_AMBULATORY_CARE_PROVIDER_SITE_OTHER): Payer: BC Managed Care – PPO | Admitting: Family

## 2013-03-28 ENCOUNTER — Encounter: Payer: Self-pay | Admitting: Family

## 2013-03-28 VITALS — BP 120/78 | HR 85 | Temp 97.9°F | Resp 18 | Ht 64.0 in | Wt 297.1 lb

## 2013-03-28 DIAGNOSIS — E876 Hypokalemia: Secondary | ICD-10-CM

## 2013-03-28 DIAGNOSIS — C73 Malignant neoplasm of thyroid gland: Secondary | ICD-10-CM

## 2013-03-28 DIAGNOSIS — M79609 Pain in unspecified limb: Secondary | ICD-10-CM

## 2013-03-28 DIAGNOSIS — C549 Malignant neoplasm of corpus uteri, unspecified: Secondary | ICD-10-CM

## 2013-03-28 DIAGNOSIS — C541 Malignant neoplasm of endometrium: Secondary | ICD-10-CM

## 2013-03-28 DIAGNOSIS — I4891 Unspecified atrial fibrillation: Secondary | ICD-10-CM

## 2013-03-28 DIAGNOSIS — I10 Essential (primary) hypertension: Secondary | ICD-10-CM

## 2013-03-28 DIAGNOSIS — M79673 Pain in unspecified foot: Secondary | ICD-10-CM

## 2013-03-28 LAB — BASIC METABOLIC PANEL
BUN: 17 mg/dL (ref 6–23)
CO2: 31 mEq/L (ref 19–32)
Chloride: 95 mEq/L — ABNORMAL LOW (ref 96–112)
Glucose, Bld: 78 mg/dL (ref 70–99)
Potassium: 4.1 mEq/L (ref 3.5–5.3)
Sodium: 139 mEq/L (ref 135–145)

## 2013-03-28 NOTE — Patient Instructions (Addendum)
Complete lab work prior to leaving. Please follow up in 3 months.

## 2013-03-28 NOTE — Progress Notes (Signed)
Subjective:    Patient ID: [name], female    DOB: 08-Mar-1956, 57 y.o.   MRN: [id number]  HPI  [name] is a 57 yr old female who presents today for follow up of multiple medical problems.  Endometrial cancer-  Recent diagnosis. Had Mirena IUD placed following  D and C.  Has follow up with GYN with plans for a follow up biopsy in 6 months.   AF- reports that she did have some fluttering in her chest this weekend but admits to not taking diltiazem as prescribed.  She is maintained on xarelto.   Hx of thyroid cancer- follows with Endo-  On synthroid. Reports thather dose was unchanged.    HTN- currently maintained on diltiazem, HCTZ,  And diovan. Reports tolerating meds without difficulty.   Foot pain- reports that her great toes are curving inward. Has been told by podiatry that she will need surgery.  Due to unusual gait from her toe pain she reports subsequent worsening of the arthritis in knees and hips.     Review of Systems See HPI  Past Medical History  Diagnosis Date  . Allergic rhinitis   . Asthma   . Hypertension   . Hypothyroidism   . Anemia, iron deficiency   . Vitamin D deficiency   . Thyroid cancer   . Morbid obesity   . GERD (gastroesophageal reflux disease)   . Hypoparathyroidism 01/07/2011  . Arthritis   . Fatty liver 01/07/2012  . Headache(784.0)     occasional sinus headache   . Atrial fibrillation August 2012  . Chronic kidney disease     kidnsy stones  . PONV (postoperative nausea and vomiting)   . History of blood transfusion   . Fibroid 1974    fibroid cyst on left fallopian tube  . Abnormal Pap smear     years ago/no biopsy  . Nephrolithiasis 2012  . Bursitis of hip left  . Edema of both legs   . Seizures     infancy secondary to fever  . Fibromyalgia   . Enlarged liver     History   Social History  . Marital Status: Single    Spouse Name: N/A    Number of Children: 0  . Years of Education: N/A   Occupational History  .  works in Medical laboratory scientific officer   . [employer]    Social History Main Topics  . Smoking status: Former Smoker -- 10 years    Quit date: 05/27/1995  . Smokeless tobacco: Never Used  . Alcohol Use: Yes     Comment: 1/2 glass  . Drug Use: No  . Sexual Activity: Yes    Partners: Male   Other Topics Concern  . 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  She was light smoker for 10 years.                 Past Surgical History  Procedure Laterality Date  . Tonsillectomy and adenoidectomy    . Bunionectomy      bilateral  . Appendectomy    . Gastric bypass  1974  . Thyroidectomy  05/15/2010  . Cyst on ovary removed     . Ureteroscopy  03/03/2012    Procedure: URETEROSCOPY;  Surgeon: Valetta Fuller, MD;  Location: [facility];  Service: Urology;  Laterality: Left;  . Cystoscopy w/ retrogrades  03/03/2012    Procedure: CYSTOSCOPY WITH RETROGRADE PYELOGRAM;  Surgeon: Valetta Fuller, MD;  Location: [facility];  Service: Urology;  Laterality: Bilateral;  . Lithotripsy  03/2012  . Cystoscopy with retrograde pyelogram, ureteroscopy and stent placement Left 08/25/2012    Procedure: CYSTOSCOPY WITH RETROGRADE PYELOGRAM, URETEROSCOPY;  Surgeon: Valetta Fuller, MD;  Location: [facility];  Service: Urology;  Laterality: Left;  . Holmium laser application Left 08/25/2012    Procedure: HOLMIUM LASER APPLICATION;  Surgeon: Valetta Fuller, MD;  Location: [facility];  Service: Urology;  Laterality: Left;  . Dilation and curettage of uterus N/A 02/21/2013    Procedure: DILATATION AND CURETTAGE;  Surgeon: Annamaria Boots, MD;  Location: [facility];  Service: Gynecology;  Laterality: N/A;    Family History  Problem Relation Age of Onset  . Hypertension Father   . Diabetes Father   . Lung cancer Father   . Heart attack Father     MI at age 62  . Hypertension Mother   . Hyperthyroidism Mother    . Heart disease Mother   . Heart attack Mother     MI at age 72  . Asthma Brother   . Hypertension Brother     younger  . Heart disease Brother     older    Allergies  Allergen Reactions  . Amoxicillin-Pot Clavulanate Other (See Comments)    headache  . Penicillins Other (See Comments)    headache    Current Outpatient Prescriptions on File Prior to Visit  Medication Sig Dispense Refill  . albuterol (PROVENTIL HFA;VENTOLIN HFA) 108 (90 BASE) MCG/ACT inhaler Inhale 2 puffs into the lungs every 6 (six) hours as needed for wheezing.      [name] albuterol (PROVENTIL) (2.5 MG/3ML) 0.083% nebulizer solution Take 2.5 mg by nebulization every 4 (four) hours as needed for wheezing.      . Ascorbic Acid (VITAMIN C CR) 1000 MG TBCR Take 1 tablet by mouth 2 (two) times a week. On Tuesday and Thursday.      . Black Cohosh 40 MG CAPS Take 1 capsule by mouth daily.       . budesonide-formoterol (SYMBICORT) 160-4.5 MCG/ACT inhaler Inhale 2 puffs into the lungs 2 (two) times daily.      . calcitRIOL (ROCALTROL) 0.5 MCG capsule Take 0.411 mcg by mouth 2 (two) times daily.       . calcium carbonate (TUMS - DOSED IN MG ELEMENTAL CALCIUM) 500 MG chewable tablet Chew 1 tablet by mouth daily.       [name] CARTIA XT 240 MG 24 hr capsule Take 240 mg by mouth daily.       . cetirizine (ZYRTEC) 10 MG tablet Take 10 mg by mouth daily.      . ergocalciferol (VITAMIN D2) 50000 UNITS capsule Take 50,000 Units by mouth 2 (two) times a week. On Saturday and Wednesday      . estradiol (ESTRACE) 0.1 MG/GM vaginal cream Also apply externally two to three times weekly.  42.5 g  6  . fluticasone (FLONASE) 50 MCG/ACT nasal spray USE 2 SPRAYS IN EACH NOSTRIL EVERY DAY  16 g  3  . furosemide (LASIX) 40 MG tablet Take 40 mg by mouth daily.      . hydrochlorothiazide (HYDRODIURIL) 25 MG tablet TAKE 1 TABLET BY MOUTH EVERY DAY  30 tablet  6  . Hydrocodone-Acetaminophen (VICODIN) 5-300 MG TABS Take 1 tablet by mouth every 6 (six)  hours.  15 each  0  . iron polysaccharides (NU-IRON) 150 MG capsule Take 150 mg by mouth 2 (two) times a week. Every Saturday and Wednesday      . levothyroxine (SYNTHROID, LEVOTHROID) 137 MCG tablet Take 411 mcg by mouth every morning.       . Magnesium 250 MG TABS Take 25 mg by mouth once a week.       Maxwell Caul Bicarbonate (ZEGERID) 20-1100 MG CAPS Take 1 capsule by mouth daily before breakfast.       . POTASSIUM CITRATE PO 1080mg .  Pt takes 4 - 6 pills daily.      [name] PRESCRIPTION MEDICATION Apply 1-2 sprays topically 4 (four) times daily. Compounded Medication:   Diclofenac 3%/Baclofen 2%/Bupivacaine 1%/Gabapentin 6%/Ibuprofen 3%/Pentoxyfylline 3%/Ethoxydiglycol 4%/Ethyl Alcohol 2%/Versatile Base Cream 70%      . Rivaroxaban (XARELTO) 20 MG TABS Take 1 tablet (20 mg total) by mouth daily.  30 tablet  9  . traMADol (ULTRAM) 50 MG tablet TAKE 1 TABLET BY MOUTH EVERY 6 HOURS  30 tablet  0  . valsartan (DIOVAN) 80 MG tablet Take 1 tablet (80 mg total) by mouth daily.  30 tablet  5  . vitamin B-12 (CYANOCOBALAMIN) 1000 MCG tablet Take 1,000 mcg by mouth 2 (two) times a week. On Tuesday and Thursday with Iron.      . vitamin E (VITAMIN E) 1000 UNIT capsule Take 1,000 Units by mouth every morning.      . zafirlukast (ACCOLATE) 20 MG tablet Take 1 tablet (20 mg total) by mouth 2 (two) times daily.  60 tablet  5   No current facility-administered medications on file prior to visit.    BP 120/78  Pulse 85  Temp(Src) 97.9 F (36.6 C) (Oral)  Resp 18  Ht 5\' 4"  (1.626 m)  Wt 297 lb 1.9 oz (134.773 kg)  BMI 50.98 kg/m2  SpO2 99%  LMP 05/26/2009        Objective:   Physical Exam  Constitutional: She is oriented to person, place, and time. She appears well-developed and well-nourished. No distress.  Cardiovascular: Normal rate and regular rhythm.   No murmur heard. Pulmonary/Chest: Effort normal and breath sounds normal. No respiratory distress. She has no wheezes. She has no  rales. She exhibits no tenderness.  Musculoskeletal: She exhibits no edema.  Bilateral toes point medially  Neurological: She is alert and oriented to person, place, and time.  Psychiatric: She has a normal mood and affect. Her behavior is normal. Judgment and thought content normal.          Assessment & Plan:

## 2013-03-29 ENCOUNTER — Encounter: Payer: Self-pay | Admitting: Family

## 2013-03-29 ENCOUNTER — Ambulatory Visit
Admission: RE | Admit: 2013-03-29 | Discharge: 2013-03-29 | Disposition: A | Discharge status: Home / Self Care | Payer: BC Managed Care – PPO | Source: Ambulatory Visit | Attending: Internal Medicine | Admitting: Internal Medicine

## 2013-03-29 DIAGNOSIS — C73 Malignant neoplasm of thyroid gland: Secondary | ICD-10-CM

## 2013-03-31 DIAGNOSIS — M79673 Pain in unspecified foot: Secondary | ICD-10-CM | POA: Insufficient documentation

## 2013-03-31 NOTE — Assessment & Plan Note (Signed)
Follows with Endo, on synthroid. Management per endo.

## 2013-03-31 NOTE — Assessment & Plan Note (Signed)
BP Readings from Last 3 Encounters:  03/28/13 120/78  02/28/13 142/80  02/21/13 120/56   BP is stable on current meds.  Continue same.

## 2013-03-31 NOTE — Assessment & Plan Note (Signed)
Related to development of toe deformity bilaterally. She has a podiatrist who has recommended surgery. She will consider after the new year.  A temporary handicapped placard was filled.

## 2013-03-31 NOTE — Assessment & Plan Note (Signed)
On xarelto. Clinically stable as long as she is compliant with meds.  Reinforced importance of med compliance.

## 2013-04-01 ENCOUNTER — Ambulatory Visit (INDEPENDENT_AMBULATORY_CARE_PROVIDER_SITE_OTHER): Payer: BC Managed Care – PPO

## 2013-04-01 ENCOUNTER — Ambulatory Visit: Payer: Self-pay

## 2013-04-01 ENCOUNTER — Encounter: Payer: Self-pay | Admitting: Podiatrist

## 2013-04-01 ENCOUNTER — Ambulatory Visit (INDEPENDENT_AMBULATORY_CARE_PROVIDER_SITE_OTHER): Payer: BC Managed Care – PPO | Admitting: Podiatrist

## 2013-04-01 VITALS — BP 111/58 | HR 106 | Resp 16 | Ht 64.0 in | Wt 300.0 lb

## 2013-04-01 DIAGNOSIS — M79672 Pain in left foot: Secondary | ICD-10-CM

## 2013-04-01 DIAGNOSIS — M79609 Pain in unspecified limb: Secondary | ICD-10-CM

## 2013-04-01 NOTE — Patient Instructions (Signed)
Pre-Operative Instructions  Congratulations, you have decided to take an important step to improving your quality of life.  You can be assured that the doctors of [facility] will be with you every step of the way.  1. Plan to be at the surgery center/hospital at least 1 (one) hour prior to your scheduled time unless otherwise directed by the surgical center/hospital staff.  You must have a responsible adult accompany you, remain during the surgery and drive you home.  Make sure you have directions to the surgical center/hospital and know how to get there on time. 2. For hospital based surgery you will need to obtain a history and physical form from your family physician within 1 month prior to the date of surgery- we will give you a form for you primary physician.  3. We make every effort to accommodate the date you request for surgery.  There are however, times where surgery dates or times have to be moved.  We will contact you as soon as possible if a change in schedule is required.   4. No Aspirin/Ibuprofen for one week before surgery.  If you are on aspirin, any non-steroidal anti-inflammatory medications (Mobic, Aleve, Ibuprofen) you should stop taking it 7 days prior to your surgery.  You make take Tylenol  For pain prior to surgery.  5. Medications- If you are taking daily heart and blood pressure medications, seizure, reflux, allergy, asthma, anxiety, pain or diabetes medications, make sure the surgery center/hospital is aware before the day of surgery so they may notify you which medications to take or avoid the day of surgery. 6. No food or drink after midnight the night before surgery unless directed otherwise by surgical center/hospital staff. 7. No alcoholic beverages 24 hours prior to surgery.  No smoking 24 hours prior to or 24 hours after surgery. 8. Wear loose pants or shorts- loose enough to fit over bandages, boots, and casts. 9. No slip on shoes, sneakers are best. 10. Bring  your boot with you to the surgery center/hospital.  Also bring crutches or a walker if your physician has prescribed it for you.  If you do not have this equipment, it will be provided for you after surgery. 11. If you have not been contracted by the surgery center/hospital by the day before your surgery, call to confirm the date and time of your surgery. 12. Leave-time from work may vary depending on the type of surgery you have.  Appropriate arrangements should be made prior to surgery with your employer. 13. Prescriptions will be provided immediately following surgery by your doctor.  Have these filled as soon as possible after surgery and take the medication as directed. 14. Remove nail polish on the operative foot. 15. Wash the night before surgery.  The night before surgery wash the foot and leg well with the antibacterial soap provided and water paying special attention to beneath the toenails and in between the toes.  Rinse thoroughly with water and dry well with a towel.  Perform this wash unless told not to do so by your physician.  f you have any questions regarding the instructions, do not hesitate to call our office.  [place]: [address] [place], [place] [address] [phone]  [place]: [address], [place], [place] [address] [phone]  [place]: [address], [place] [address] [phone]  Dr. Santiago Bumpers DPM, Dr. Cristie Hem DPM Dr. Alvan Dame DPM, Dr. Ardelle Anton DPM, Dr. Marlowe Aschoff DPM

## 2013-04-01 NOTE — Progress Notes (Signed)
Subjective:  Patient presents today for evaluation of bilateral foot pain.  Patient has been seen in the past and has most recently been treated with orthotics which are helping.   At today's visit she is interested in talking about surgery.   Objective: GENERAL APPEARANCE: Alert, conversant. Appropriately groomed. No acute distress.  VASCULAR: Pedal pulses palpable and strong bilateral.  Capillary refill time is immediate to all digits,  Proximal to distal cooling it warm to warm.  Digital hair growth is present bilateral  NEUROLOGIC: sensation is intact epicritically and protectively to 5.07 monofilament at 5/5 sites bilateral.  Light touch is intact bilateral, vibratory sensation intact bilateral, achilles tendon reflex is intact bilateral.  MUSCULOSKELETAL: acceptable muscle strength, tone and stability bilateral. Hallux varus deformity is present to bilateral feet with the left being more symptomatic than the right.  Contracture deformities bilateral are also present with medial deviation of the 2nd digits seen left greater than right.   DERMATOLOGIC: hyperkeratotic lesions present bilateral especially laterally with discomfort present upon ambulation and palpation.  Assessment: Hallux Varus deformity Left greater than right; hammertoe contracture with long 2nd met left and hammertoe 2nd, hyperkeratotic lesions  Plan:  Lesions debrided on the lateral aspect of bilateral feet.  Discussed surgical intervention including a hallux varus surgery with bone anchor, shortening osteotomy of the 2nd metatarsal, and re-alignment of the 2nd digit.  Consent forms were discussed and all questions were encouraged and answered to the best of my ability  She will be scheduled for surgery at her convenience and will call if any question or concerns arise.

## 2013-04-07 ENCOUNTER — Other Ambulatory Visit: Payer: Self-pay | Admitting: Family

## 2013-04-27 ENCOUNTER — Telehealth: Payer: Self-pay | Admitting: *Deleted

## 2013-04-27 NOTE — Telephone Encounter (Signed)
[name] from [facility] states pt says the surgery is scheduled for 05/09/2013.  I checked our correspondence and surgical paperwork, the surgery was scheduled for 05/02/2013.  I contacted pt, informed the surgery date was set for 05/02/2013, Dr Irving Shows had scheduled her time and the assisting surgeon's time.  Pt states that will be okay.  I left a message for [name] [phone], and will inform Dr Irving Shows.

## 2013-04-28 ENCOUNTER — Telehealth: Payer: Self-pay | Admitting: *Deleted

## 2013-04-28 NOTE — Telephone Encounter (Signed)
Pt states she has a history of MRSA and is on Xarelto.  What are her instruction pre-op?  Dr Irving Shows ordered stop Xarelto today and will receive IV antibiotic the day of surgery.  Orders called to pt, she states understanding.

## 2013-05-02 ENCOUNTER — Ambulatory Visit: Payer: BC Managed Care – PPO

## 2013-05-02 ENCOUNTER — Encounter: Payer: Self-pay | Admitting: Podiatrist

## 2013-05-02 DIAGNOSIS — M21549 Acquired clubfoot, unspecified foot: Secondary | ICD-10-CM

## 2013-05-02 DIAGNOSIS — M2010 Hallux valgus (acquired), unspecified foot: Secondary | ICD-10-CM

## 2013-05-11 ENCOUNTER — Encounter: Payer: BC Managed Care – PPO | Admitting: Podiatrist

## 2013-05-13 ENCOUNTER — Other Ambulatory Visit: Payer: Self-pay | Admitting: Podiatrist

## 2013-05-13 ENCOUNTER — Encounter: Payer: Self-pay | Admitting: Podiatrist

## 2013-05-13 ENCOUNTER — Other Ambulatory Visit: Payer: Self-pay | Admitting: Family

## 2013-05-13 ENCOUNTER — Ambulatory Visit (INDEPENDENT_AMBULATORY_CARE_PROVIDER_SITE_OTHER): Payer: BC Managed Care – PPO | Admitting: Podiatrist

## 2013-05-13 ENCOUNTER — Ambulatory Visit (INDEPENDENT_AMBULATORY_CARE_PROVIDER_SITE_OTHER): Payer: BC Managed Care – PPO

## 2013-05-13 VITALS — BP 121/67 | HR 83 | Temp 98.6°F | Resp 20

## 2013-05-13 DIAGNOSIS — Z9889 Other specified postprocedural states: Secondary | ICD-10-CM

## 2013-05-13 MED ORDER — HYDROCODONE-ACETAMINOPHEN 5-325 MG PO TABS: 1.0000 | ORAL_TABLET | ORAL | Status: DC | PRN

## 2013-05-13 NOTE — Telephone Encounter (Signed)
Rx request to pharmacy/SLS  

## 2013-05-18 ENCOUNTER — Encounter: Payer: Self-pay | Admitting: Podiatrist

## 2013-05-18 ENCOUNTER — Ambulatory Visit (INDEPENDENT_AMBULATORY_CARE_PROVIDER_SITE_OTHER): Payer: BC Managed Care – PPO | Admitting: Podiatrist

## 2013-05-18 VITALS — BP 140/65 | HR 79 | Resp 19 | Ht 64.0 in | Wt 300.0 lb

## 2013-05-18 DIAGNOSIS — Z9889 Other specified postprocedural states: Secondary | ICD-10-CM

## 2013-05-18 NOTE — Progress Notes (Signed)
Subjective: [name] presents today for her first followup visit after surgery to repair hallux varus as well as a metatarsal osteotomy of the second all on the left foot. Patient states she's been doing well she denies any nausea, vomiting, fevers, chills, night sweats or calf pain or tenderness. She does however have an area of tenderness submetatarsal 5 of the left foot most likely due to the boot or the wrapping.  Objective: Good Appearance of the left foot is seen.  The left hallux is in a straightened position and the second digit is also more rectus. No redness, no swelling, and no signs of infection are present. Excellent appearance of the incision line is noted. With no dehiscence present. X-rays are taken and revealed good alignment and position postoperatively.  Assessment: Status post left foot surgery repair of hallux varus and shortening second metatarsal osteotomy  Plan: Redressed the foot and a dry sterile compressive dressing and instructed the patient keep the foot dry. I will see her back in one week for suture removal and recheck of the foot. If any problems or concerns arise prior that visit she is instructed to call.

## 2013-05-18 NOTE — Progress Notes (Signed)
  Subjective: [name] presents today for her second followup visit after surgery to repair hallux varus as well as a metatarsal osteotomy of the second all on the left foot. Patient states she's still been doing well she denies any nausea, vomiting, fevers, chills, night sweats or calf pain or tenderness. She does continue to have an area of tenderness submetatarsal 5 of the left foot which is bruised and tender.  Objective: excellent post op Appearance of the left foot is seen.  The left hallux is in a straightened position and the second digit is also more rectus. No redness, no swelling, and no signs of infection are present. Excellent appearance of the incision line is noted. With no dehiscence present. Small bruise on the submet 5 region left is also noted.  Assessment: Status post left foot surgery repair of hallux varus and shortening second metatarsal osteotomy date of surgery 05/02/2013  Plan: Suture ends are removed and I Redressed the foot in a dry sterile compressive dressing and instructed the patient keep the foot dry for 3 more days. She will stay in her boot for 2 more weeks and I will see her back then for recheck. I also dispensed a padding for her to try to offload the fifth metatarsal head. If any problems or concerns arise prior that visit she is instructed to call.

## 2013-06-03 ENCOUNTER — Encounter: Payer: Self-pay | Admitting: Podiatrist

## 2013-06-03 ENCOUNTER — Ambulatory Visit (INDEPENDENT_AMBULATORY_CARE_PROVIDER_SITE_OTHER): Payer: BC Managed Care – PPO

## 2013-06-03 ENCOUNTER — Ambulatory Visit: Payer: BC Managed Care – PPO | Admitting: Podiatrist

## 2013-06-03 VITALS — BP 118/74 | HR 100 | Resp 12

## 2013-06-03 DIAGNOSIS — Z9889 Other specified postprocedural states: Secondary | ICD-10-CM

## 2013-06-03 MED ORDER — HYDROCODONE-ACETAMINOPHEN 5-325 MG PO TABS: 1.0000 | ORAL_TABLET | ORAL | Status: DC | PRN

## 2013-06-06 NOTE — Progress Notes (Signed)
1) Metatarsal osteotomy 2nd met left foot 2) Hallux varus repair with bone anchor left foot

## 2013-06-07 NOTE — Progress Notes (Signed)
Subjective: [name] presents today for her third followup visit after surgery to repair hallux varus as well as a metatarsal osteotomy of the second all on the left foot. Patient states she's still been doing well she denies any nausea, vomiting, fevers, chills, night sweats or calf pain or tenderness. She does continue to have an area of tenderness submetatarsal 5 of the left foot which continues to be bruised and tender.   Objective: excellent post op Appearance of the left foot is seen. The left hallux is in a straightened position and the second digit is also more rectus. No redness, no swelling, and no signs of infection are present. Excellent appearance of the incision line is noted. Dried uncomfortable bruise on the submet 5 region left is also noted--most likely from bandage post surgery .  Assessment: Status post left foot surgery repair of hallux varus and shortening second metatarsal osteotomy date of surgery 05/02/2013   Plan: Recommended [name] stay in her boot for 2 more weeks. Also recommended a compressive dressing and elevation. I'll see her back in 2 weeks and we'll most likely get her back into a Darco shoe. If she has any problems or concerns prior that visit she will call.

## 2013-06-11 ENCOUNTER — Other Ambulatory Visit: Payer: Self-pay | Admitting: Family

## 2013-06-17 ENCOUNTER — Ambulatory Visit (INDEPENDENT_AMBULATORY_CARE_PROVIDER_SITE_OTHER): Payer: BC Managed Care – PPO | Admitting: Podiatrist

## 2013-06-17 ENCOUNTER — Encounter: Payer: Self-pay | Admitting: Podiatrist

## 2013-06-17 VITALS — BP 114/74 | HR 82 | Resp 18

## 2013-06-17 DIAGNOSIS — Z9889 Other specified postprocedural states: Secondary | ICD-10-CM

## 2013-06-17 NOTE — Progress Notes (Signed)
Subjective: [name] presents today for her surgical followup of left foot surgery where hallux varus repair was performed shortening second metatarsal osteotomy was performed date of surgery was 05/02/2013. She states " It is doing pretty good on my left foot and the other foot is now bothering me as well" she states she's having pain submetatarsal 5 right foot which is recent. She also states she's been wearing her Darco splint as instructed in the left great toe continues to want to go back into its presurgical appearance. She states her foot and cramping as well.   Objective: The left hallux is drifting and more of an medial position similar to that of prior to surgery. The second digit however continues to be rectus alignment. No redness, no swelling in the incision site has completely healed.  Excellent appearance of the bruise submetatarsal 5 left which is improving significantly is noted. She does have a new area of discomfort submetatarsal 5 right most likely due to pressure from shoes and from having to compensate her weight on this right foot. No obvious sign of keratotic lesion is present. .  Assessment: Status post left foot surgery repair of hallux varus and shortening second metatarsal osteotomy date of surgery 05/02/2013   Plan: Recommended [name] get back into a shoe and also recommended taping the first and second toes together of the left foot. Added a pad to the right foot in hopes this would take the pressure off of the sub-met 5 region. I'll see her back in a month for recheck. I discussed the left hallux continues to deviate medially are only other option would be fusion.

## 2013-06-17 NOTE — Patient Instructions (Signed)
Keep wearing your Darco splint at night-- wear your taping on the toes during the day.  Wear your shoe now and this will also help hold the toe in a corrected position.  Call ifyou have any questions or concers

## 2013-06-20 ENCOUNTER — Ambulatory Visit (INDEPENDENT_AMBULATORY_CARE_PROVIDER_SITE_OTHER): Payer: BC Managed Care – PPO | Admitting: Family

## 2013-06-20 ENCOUNTER — Encounter: Payer: Self-pay | Admitting: Family

## 2013-06-20 VITALS — BP 104/62 | HR 87 | Temp 97.4°F | Resp 16 | Wt 298.0 lb

## 2013-06-20 DIAGNOSIS — M79673 Pain in unspecified foot: Secondary | ICD-10-CM

## 2013-06-20 DIAGNOSIS — M79609 Pain in unspecified limb: Secondary | ICD-10-CM

## 2013-06-20 NOTE — Patient Instructions (Signed)
Please follow up in 6 months.

## 2013-06-20 NOTE — Progress Notes (Signed)
Subjective:    Patient ID: [name], female    DOB: Aug 03, 1955, 58 y.o.   MRN: [id number]  HPI  [name] is a 58 yr old female who presents today for follow up. She is requesting a renewal of her handicapped placard.   She recently had foot surgery on her left foot.  Continues to have pain and difficulty ambulating.    Review of Systems See HPI  Past Medical History  Diagnosis Date  . Allergic rhinitis   . Asthma   . Hypertension   . Hypothyroidism   . Anemia, iron deficiency   . Vitamin D deficiency   . Thyroid cancer   . Morbid obesity   . GERD (gastroesophageal reflux disease)   . Hypoparathyroidism 01/07/2011  . Arthritis   . Fatty liver 01/07/2012  . Headache(784.0)     occasional sinus headache   . Atrial fibrillation August 2012  . Chronic kidney disease     kidnsy stones  . PONV (postoperative nausea and vomiting)   . History of blood transfusion   . Fibroid 1974    fibroid cyst on left fallopian tube  . Abnormal Pap smear     years ago/no biopsy  . Nephrolithiasis 2012  . Bursitis of hip left  . Edema of both legs   . Seizures     infancy secondary to fever  . Fibromyalgia   . Enlarged liver     History   Social History  . Marital Status: Single    Spouse Name: N/A    Number of Children: 0  . Years of Education: N/A   Occupational History  . works in Insurance claims handler   . [employer]    Social History Rogers Topics  . Smoking status: Former Smoker -- 10 years    Quit date: 05/27/1995  . Smokeless tobacco: Never Used  . Alcohol Use: Yes     Comment: 1/2 glass  . Drug Use: No  . Sexual Activity: Yes    Partners: Male   Other Topics Concern  . Not on file   Social History Narrative   Occupation: works in Insurance claims handler - Field seismologist   Single       Former Smoker - quit tobacco 12 years ago.  She was light smoker for 10 years.                 Past Surgical History  Procedure Laterality Date  .  Tonsillectomy and adenoidectomy    . Bunionectomy      bilateral  . Appendectomy    . Gastric bypass  1974  . Thyroidectomy  05/15/2010  . Cyst on ovary removed     . Ureteroscopy  03/03/2012    Procedure: URETEROSCOPY;  Surgeon: Bernestine Amass, MD;  Location: [facility];  Service: Urology;  Laterality: Left;  . Cystoscopy w/ retrogrades  03/03/2012    Procedure: CYSTOSCOPY WITH RETROGRADE PYELOGRAM;  Surgeon: Bernestine Amass, MD;  Location: [facility];  Service: Urology;  Laterality: Bilateral;  . Lithotripsy  03/2012  . Cystoscopy with retrograde pyelogram, ureteroscopy and stent placement Left 08/25/2012    Procedure: CYSTOSCOPY WITH RETROGRADE PYELOGRAM, URETEROSCOPY;  Surgeon: Bernestine Amass, MD;  Location: [facility];  Service: Urology;  Laterality: Left;  . Holmium laser application Left 0000000    Procedure: HOLMIUM LASER APPLICATION;  Surgeon: Bernestine Amass, MD;  Location: [facility];  Service: Urology;  Laterality: Left;  . Dilation and curettage of  uterus N/A 02/21/2013    Procedure: DILATATION AND CURETTAGE;  Surgeon: Lyman Speller, MD;  Location: [facility];  Service: Gynecology;  Laterality: N/A;    Family History  Problem Relation Age of Onset  . Hypertension Father   . Diabetes Father   . Lung cancer Father   . Heart attack Father     MI at age 60  . Hypertension Mother   . Hyperthyroidism Mother   . Heart disease Mother   . Heart attack Mother     MI at age 70  . Asthma Brother   . Hypertension Brother     younger  . Heart disease Brother     older    Allergies  Allergen Reactions  . Other Anaphylaxis    NUTS  . Amoxicillin-Pot Clavulanate Other (See Comments)    headache  . Penicillins Other (See Comments)    headache    Current Outpatient Prescriptions on File Prior to Visit  Medication Sig Dispense Refill  . albuterol (PROVENTIL HFA;VENTOLIN HFA) 108 (90 BASE) MCG/ACT inhaler Inhale 2 puffs into the lungs every 6 (six) hours as needed for wheezing.      .  Ascorbic Acid (VITAMIN C CR) 1000 MG TBCR Take 1 tablet by mouth 2 (two) times a week. On Tuesday and Thursday.      . Black Cohosh 40 MG CAPS Take 1 capsule by mouth daily.       . budesonide-formoterol (SYMBICORT) 160-4.5 MCG/ACT inhaler Inhale 2 puffs into the lungs 2 (two) times daily.      . calcitRIOL (ROCALTROL) 0.5 MCG capsule Take 0.411 mcg by mouth 2 (two) times daily.       . calcium carbonate (TUMS - DOSED IN MG ELEMENTAL CALCIUM) 500 MG chewable tablet Chew 1 tablet by mouth daily.       [name] CARTIA XT 240 MG 24 hr capsule Take 240 mg by mouth daily.       . cetirizine (ZYRTEC) 10 MG tablet Take 10 mg by mouth daily.      . ergocalciferol (VITAMIN D2) 50000 UNITS capsule Take 50,000 Units by mouth 2 (two) times a week. On Saturday and Wednesday      . estradiol (ESTRACE) 0.1 MG/GM vaginal cream Also apply externally two to three times weekly.  42.5 g  6  . fluticasone (FLONASE) 50 MCG/ACT nasal spray USE 2 SPRAYS IN EACH NOSTRIL EVERY DAY  16 g  3  . furosemide (LASIX) 40 MG tablet TAKE 1 TABLET BY MOUTH EVERY DAY  30 tablet  2  . hydrochlorothiazide (HYDRODIURIL) 25 MG tablet TAKE 1 TABLET BY MOUTH EVERY DAY  30 tablet  6  . HYDROcodone-acetaminophen (NORCO/VICODIN) 5-325 MG per tablet Take 1 tablet by mouth every 4 (four) hours as needed.  40 tablet  0  . Hydrocodone-Acetaminophen (VICODIN) 5-300 MG TABS Take 1 tablet by mouth every 6 (six) hours.  15 each  0  . iron polysaccharides (NU-IRON) 150 MG capsule Take 150 mg by mouth 2 (two) times a week. Every Saturday and Wednesday      . levothyroxine (SYNTHROID, LEVOTHROID) 137 MCG tablet Take 411 mcg by mouth every morning.       . Magnesium 250 MG TABS Take 25 mg by mouth once a week.       . naproxen sodium (ANAPROX) 550 MG tablet       . Omeprazole-Sodium Bicarbonate (ZEGERID) 20-1100 MG CAPS Take 1 capsule by mouth daily before breakfast.       .  OVER THE COUNTER MEDICATION MAGNESIUM MALADE TAKE ONE PILL WITH EACH MEAL THREE TIMES  DAILY      . oxyCODONE-acetaminophen (PERCOCET) 7.5-325 MG per tablet       . POTASSIUM CITRATE PO 1080mg .  Pt takes 4 - 6 pills daily.      [name] PRESCRIPTION MEDICATION Apply 1-2 sprays topically 4 (four) times daily. Compounded Medication:   Diclofenac 3%/Baclofen 2%/Bupivacaine 1%/Gabapentin 6%/Ibuprofen 3%/Pentoxyfylline 3%/Ethoxydiglycol 4%/Ethyl Alcohol 2%/Versatile Base Cream 70%      . Rivaroxaban (XARELTO) 20 MG TABS Take 1 tablet (20 mg total) by mouth daily.  30 tablet  9  . sulfamethoxazole-trimethoprim (BACTRIM DS) 800-160 MG per tablet       . traMADol (ULTRAM) 50 MG tablet TAKE 1 TABLET BY MOUTH EVERY 6 HOURS  30 tablet  0  . UNABLE TO FIND MANGANESE 60MG  TAKE ONE PILL WITH EACH MEAL THREE TIMES DAILY      . valsartan (DIOVAN) 80 MG tablet Take 1 tablet (80 mg total) by mouth daily.  30 tablet  5  . vitamin B-12 (CYANOCOBALAMIN) 1000 MCG tablet Take 1,000 mcg by mouth 2 (two) times a week. On Tuesday and Thursday with Iron.      . vitamin E (VITAMIN E) 1000 UNIT capsule Take 1,000 Units by mouth every morning.      . zafirlukast (ACCOLATE) 20 MG tablet Take 1 tablet (20 mg total) by mouth 2 (two) times daily.  60 tablet  5   No current facility-administered medications on file prior to visit.    BP 104/62  Pulse 87  Temp(Src) 97.4 F (36.3 C) (Oral)  Resp 16  Wt 298 lb (135.172 kg)  SpO2 97%  LMP 05/26/2009       Objective:   Physical Exam  Constitutional: She is oriented to person, place, and time. She appears well-developed and well-nourished. No distress.  Cardiovascular: Normal rate and regular rhythm.   No murmur heard. Pulmonary/Chest: Effort normal and breath sounds normal. No respiratory distress. She has no wheezes. She has no rales. She exhibits no tenderness.  Musculoskeletal: She exhibits no edema.  Neurological: She is alert and oriented to person, place, and time.  Psychiatric: She has a normal mood and affect. Her behavior is normal. Judgment and  thought content normal.          Assessment & Plan:

## 2013-06-23 NOTE — Assessment & Plan Note (Signed)
Handicapped placard form filled.

## 2013-07-13 ENCOUNTER — Other Ambulatory Visit: Payer: Self-pay | Admitting: Family

## 2013-07-22 ENCOUNTER — Ambulatory Visit (INDEPENDENT_AMBULATORY_CARE_PROVIDER_SITE_OTHER): Payer: BC Managed Care – PPO | Admitting: Podiatrist

## 2013-07-22 ENCOUNTER — Encounter: Payer: Self-pay | Admitting: Podiatrist

## 2013-07-22 VITALS — BP 135/80 | HR 88 | Resp 18

## 2013-07-22 DIAGNOSIS — Q828 Other specified congenital malformations of skin: Secondary | ICD-10-CM

## 2013-07-22 DIAGNOSIS — Z9889 Other specified postprocedural states: Secondary | ICD-10-CM

## 2013-07-22 DIAGNOSIS — M7150 Other bursitis, not elsewhere classified, unspecified site: Secondary | ICD-10-CM

## 2013-07-22 DIAGNOSIS — M21619 Bunion of unspecified foot: Secondary | ICD-10-CM

## 2013-07-22 DIAGNOSIS — M21621 Bunionette of right foot: Secondary | ICD-10-CM

## 2013-07-22 NOTE — Progress Notes (Signed)
i had surgery done on my left foot on 05/02/13 and it is curling up and it is like we never did anything to it and right foot and it hurts on the 5th toe on the right and it cramps up and I am not leaving here with pain  Subjective: [name] presents today for her surgical followup of left foot surgery where hallux varus repair was performed shortening second metatarsal osteotomy was performed date of surgery was 05/02/2013. she's having pain submetatarsal 5 right foot which is newer onset but still uncomfortable as it was at her last visit. She also states she's been wearing her Darco splint as instructed in the left great toe continues to want to go back into its presurgical appearance. She states her foot continues cramping as well.   Objective: The left hallux has medially deviated as it was prior to surgery. The second digit continues to be rectus alignment. No redness, no swelling in the incision site has completely healed. Pain submetatarsal 5 right most likely due to pressure from shoes and from having to compensate her weight on this right foot. Keratotic lesion is present and deeply enucleated lateral 5th met right.   Assessment: Status post left foot surgery repair of hallux varus and shortening second metatarsal osteotomy date of surgery 05/02/2013 , exostosis 5th met head and keratotic lesion, bursitis right  Plan: Recommended [name] continued taping the first and second toes together of the left foot. Anesthetized with dexamethasone and marcaine mix at the 5th met head right and I debrided the keratotic lesion without complication.  I applied a bandage and gave instructions for aftercare.  I discussed the medial dislocation of the great toe-- at this point, a fusion would be necessary to straighten out this toe.  I will see her back for reevaluation prn for consideration of fusion of the left first mpj

## 2013-08-02 ENCOUNTER — Other Ambulatory Visit: Payer: Self-pay | Admitting: Family

## 2013-08-03 ENCOUNTER — Other Ambulatory Visit: Payer: Self-pay

## 2013-08-03 MED ORDER — DILTIAZEM HCL ER COATED BEADS 240 MG PO CP24: 240.0000 mg | ORAL_CAPSULE | Freq: Every day | ORAL | Status: DC

## 2013-08-19 ENCOUNTER — Encounter: Payer: Self-pay | Admitting: Family

## 2013-08-19 ENCOUNTER — Telehealth: Payer: Self-pay | Admitting: Family

## 2013-08-19 ENCOUNTER — Ambulatory Visit (INDEPENDENT_AMBULATORY_CARE_PROVIDER_SITE_OTHER): Payer: BC Managed Care – PPO | Admitting: Family

## 2013-08-19 ENCOUNTER — Ambulatory Visit (HOSPITAL_BASED_OUTPATIENT_CLINIC_OR_DEPARTMENT_OTHER)
Admission: RE | Admit: 2013-08-19 | Discharge: 2013-08-19 | Disposition: A | Discharge status: Home / Self Care | Payer: BC Managed Care – PPO | Source: Ambulatory Visit | Attending: Family | Admitting: Family

## 2013-08-19 VITALS — BP 110/70 | HR 95 | Temp 98.3°F | Resp 16 | Ht 64.0 in | Wt 298.0 lb

## 2013-08-19 DIAGNOSIS — R059 Cough, unspecified: Secondary | ICD-10-CM

## 2013-08-19 DIAGNOSIS — R05 Cough: Secondary | ICD-10-CM | POA: Insufficient documentation

## 2013-08-19 DIAGNOSIS — J45909 Unspecified asthma, uncomplicated: Secondary | ICD-10-CM

## 2013-08-19 DIAGNOSIS — R062 Wheezing: Secondary | ICD-10-CM | POA: Insufficient documentation

## 2013-08-19 DIAGNOSIS — J209 Acute bronchitis, unspecified: Secondary | ICD-10-CM

## 2013-08-19 MED ORDER — ALBUTEROL SULFATE HFA 108 (90 BASE) MCG/ACT IN AERS: 2.0000 | INHALATION_SPRAY | Freq: Four times a day (QID) | RESPIRATORY_TRACT | Status: DC | PRN

## 2013-08-19 MED ORDER — PREDNISONE 10 MG PO TABS: ORAL_TABLET | ORAL | Status: DC

## 2013-08-19 MED ORDER — AZITHROMYCIN 250 MG PO TABS: ORAL_TABLET | ORAL | Status: DC

## 2013-08-19 NOTE — Progress Notes (Signed)
Pre visit review using our clinic review tool, if applicable. No additional management support is needed unless otherwise documented below in the visit note. 

## 2013-08-19 NOTE — Patient Instructions (Signed)
Start prednisone, use albuterol every 6 hours while awake. Complete chest x ray on the first floor. Call if symptoms worsen or if not improved in 2-3 days.

## 2013-08-19 NOTE — Progress Notes (Signed)
Subjective:    Patient ID: [name], female    DOB: 05-02-1956, 58 y.o.   MRN: [id number]  HPI  Ms. Hawbaker is a 58 yr old female who presents today with chief complaint of cough.  Cough started 5 days ago. Developed a sore throat last night. Cough became productive 3 days ago along with horseness.  + fatigue.  She denies associated fever.  Denies nausea/vomitting. + nasal drainage.  Has tried OTC cough/chest congestion and ASA.  Some improvement in symptoms with cough syrup.   Review of Systems See HPI  Past Medical History  Diagnosis Date  . Allergic rhinitis   . Asthma   . Hypertension   . Hypothyroidism   . Anemia, iron deficiency   . Vitamin D deficiency   . Thyroid cancer   . Morbid obesity   . GERD (gastroesophageal reflux disease)   . Hypoparathyroidism 01/07/2011  . Arthritis   . Fatty liver 01/07/2012  . Headache(784.0)     occasional sinus headache   . Atrial fibrillation August 2012  . Chronic kidney disease     kidnsy stones  . PONV (postoperative nausea and vomiting)   . History of blood transfusion   . Fibroid 1974    fibroid cyst on left fallopian tube  . Abnormal Pap smear     years ago/no biopsy  . Nephrolithiasis 2012  . Bursitis of hip left  . Edema of both legs   . Seizures     infancy secondary to fever  . Fibromyalgia   . Enlarged liver     History   Social History  . Marital Status: Single    Spouse Name: N/A    Number of Children: 0  . Years of Education: N/A   Occupational History  . works in Insurance claims handler   . [employer]    Social History Main Topics  . Smoking status: Former Smoker -- 10 years    Quit date: 05/27/1995  . Smokeless tobacco: Never Used  . Alcohol Use: Yes     Comment: 1/2 glass  . Drug Use: No  . Sexual Activity: Yes    Partners: Male   Other Topics Concern  . Not on file   Social History Narrative   Occupation: works in Insurance claims handler - Field seismologist   Single       Former Smoker - quit tobacco 12 years ago.  She was light smoker for 10 years.                 Past Surgical History  Procedure Laterality Date  . Tonsillectomy and adenoidectomy    . Bunionectomy      bilateral  . Appendectomy    . Gastric bypass  1974  . Thyroidectomy  05/15/2010  . Cyst on ovary removed     . Ureteroscopy  03/03/2012    Procedure: URETEROSCOPY;  Surgeon: Bernestine Amass, MD;  Location: [facility];  Service: Urology;  Laterality: Left;  . Cystoscopy w/ retrogrades  03/03/2012    Procedure: CYSTOSCOPY WITH RETROGRADE PYELOGRAM;  Surgeon: Bernestine Amass, MD;  Location: [facility];  Service: Urology;  Laterality: Bilateral;  . Lithotripsy  03/2012  . Cystoscopy with retrograde pyelogram, ureteroscopy and stent placement Left 08/25/2012    Procedure: CYSTOSCOPY WITH RETROGRADE PYELOGRAM, URETEROSCOPY;  Surgeon: Bernestine Amass, MD;  Location: [facility];  Service: Urology;  Laterality: Left;  . Holmium laser application Left 08/30/4257    Procedure: HOLMIUM LASER  APPLICATION;  Surgeon: Bernestine Amass, MD;  Location: [facility];  Service: Urology;  Laterality: Left;  . Dilation and curettage of uterus N/A 02/21/2013    Procedure: DILATATION AND CURETTAGE;  Surgeon: Lyman Speller, MD;  Location: [facility];  Service: Gynecology;  Laterality: N/A;    Family History  Problem Relation Age of Onset  . Hypertension Father   . Diabetes Father   . Lung cancer Father   . Heart attack Father     MI at age 5  . Hypertension Mother   . Hyperthyroidism Mother   . Heart disease Mother   . Heart attack Mother     MI at age 59  . Asthma Brother   . Hypertension Brother     younger  . Heart disease Brother     older    Allergies  Allergen Reactions  . Other Anaphylaxis    NUTS  . Amoxicillin-Pot Clavulanate Other (See Comments)    headache  . Penicillins Other (See Comments)    headache    Current Outpatient Prescriptions on File Prior to Visit  Medication Sig Dispense Refill  .  Ascorbic Acid (VITAMIN C CR) 1000 MG TBCR Take 1 tablet by mouth 2 (two) times a week. On Tuesday and Thursday.      . Black Cohosh 40 MG CAPS Take 1 capsule by mouth daily.       . calcitRIOL (ROCALTROL) 0.5 MCG capsule Take 0.411 mcg by mouth 2 (two) times daily.       . calcium carbonate (TUMS - DOSED IN MG ELEMENTAL CALCIUM) 500 MG chewable tablet Chew 1 tablet by mouth daily.       . cetirizine (ZYRTEC) 10 MG tablet Take 10 mg by mouth daily.      [name] diltiazem (CARTIA XT) 240 MG 24 hr capsule Take 1 capsule (240 mg total) by mouth daily.  90 capsule  1  . ergocalciferol (VITAMIN D2) 50000 UNITS capsule Take 50,000 Units by mouth 2 (two) times a week. On Saturday and Wednesday      . estradiol (ESTRACE) 0.1 MG/GM vaginal cream Also apply externally two to three times weekly.  42.5 g  6  . fluticasone (FLONASE) 50 MCG/ACT nasal spray USE 2 SPRAYS IN EACH NOSTRIL EVERY DAY  16 g  3  . hydrochlorothiazide (HYDRODIURIL) 25 MG tablet TAKE 1 TABLET BY MOUTH EVERY DAY  30 tablet  6  . Hydrocodone-Acetaminophen (VICODIN) 5-300 MG TABS Take 1 tablet by mouth every 6 (six) hours.  15 each  0  . iron polysaccharides (NU-IRON) 150 MG capsule Take 150 mg by mouth 2 (two) times a week. Every Saturday and Wednesday      . levothyroxine (SYNTHROID, LEVOTHROID) 137 MCG tablet Take 411 mcg by mouth every morning.       . methocarbamol (ROBAXIN) 500 MG tablet       . Omeprazole-Sodium Bicarbonate (ZEGERID) 20-1100 MG CAPS Take 1 capsule by mouth daily before breakfast.       . Rivaroxaban (XARELTO) 20 MG TABS Take 1 tablet (20 mg total) by mouth daily.  30 tablet  9  . SYMBICORT 160-4.5 MCG/ACT inhaler INHALE 2 PUFFS TWICE DAILY  10.2 g  1  . traMADol (ULTRAM) 50 MG tablet TAKE 1 TABLET BY MOUTH EVERY 6 HOURS  30 tablet  0  . UNABLE TO FIND MANGANESE 60MG  TAKE ONE PILL WITH EACH MEAL THREE TIMES DAILY      . valsartan (DIOVAN) 80 MG tablet  TAKE 1 TABLET BY MOUTH DAILY  30 tablet  0  . vitamin B-12  (CYANOCOBALAMIN) 1000 MCG tablet Take 1,000 mcg by mouth 2 (two) times a week. On Tuesday and Thursday with Iron.      . vitamin E (VITAMIN E) 1000 UNIT capsule Take 1 two times a week.      . zafirlukast (ACCOLATE) 20 MG tablet Take 1 tablet (20 mg total) by mouth 2 (two) times daily.  60 tablet  5  . POTASSIUM CITRATE PO 1080mg .  Pt takes 4 - 6 pills daily.       No current facility-administered medications on file prior to visit.    BP 110/70  Pulse 95  Temp(Src) 98.3 F (36.8 C) (Oral)  Resp 16  Ht 5\' 4"  (1.626 m)  Wt 298 lb (135.172 kg)  BMI 51.13 kg/m2  SpO2 95%  LMP 05/26/2009       Objective:   Physical Exam  Constitutional: She is oriented to person, place, and time. She appears well-developed and well-nourished. No distress.  HENT:  Head: Normocephalic and atraumatic.  Mouth/Throat: No oropharyngeal exudate, posterior oropharyngeal edema or posterior oropharyngeal erythema.  Mild voice hoarsness  Cardiovascular: Normal rate and regular rhythm.   No murmur heard. Pulmonary/Chest: Effort normal.  + left sided expiratory wheeze noted  Neurological: She is alert and oriented to person, place, and time.  Psychiatric: She has a normal mood and affect. Her behavior is normal. Judgment and thought content normal.          Assessment & Plan:

## 2013-08-19 NOTE — Telephone Encounter (Signed)
Please contact pt and let her know that her chest x ray is negative for pneumonia.  I have sent zpak to her pharmacy to treat her for bronchitis. She should take along with pred taper we discussed at her visit and albuterol.

## 2013-08-19 NOTE — Assessment & Plan Note (Signed)
CXR neg for infiltrate. Will rx with zpak, pred taper and albuterol Q6 hours via [facility] or neb until symptoms are improved. Pt advised to follow up if symptoms worsen or if symptoms do not improve.

## 2013-08-19 NOTE — Telephone Encounter (Signed)
Notified pt. 

## 2013-08-31 ENCOUNTER — Ambulatory Visit: Payer: BC Managed Care – PPO | Admitting: Family

## 2013-09-01 ENCOUNTER — Other Ambulatory Visit: Payer: Self-pay | Admitting: Family

## 2013-09-01 NOTE — Telephone Encounter (Signed)
Rx request to pharmacy/SLS  

## 2013-09-02 ENCOUNTER — Encounter: Payer: Self-pay | Admitting: Podiatrist

## 2013-09-02 ENCOUNTER — Ambulatory Visit (INDEPENDENT_AMBULATORY_CARE_PROVIDER_SITE_OTHER): Payer: BC Managed Care – PPO | Admitting: Podiatrist

## 2013-09-02 VITALS — BP 131/71 | HR 93 | Resp 16

## 2013-09-02 DIAGNOSIS — M21619 Bunion of unspecified foot: Secondary | ICD-10-CM

## 2013-09-02 DIAGNOSIS — M7150 Other bursitis, not elsewhere classified, unspecified site: Secondary | ICD-10-CM

## 2013-09-02 DIAGNOSIS — M21621 Bunionette of right foot: Secondary | ICD-10-CM

## 2013-09-02 MED ORDER — TRIAMCINOLONE ACETONIDE 10 MG/ML IJ SUSP
10.0000 mg | Freq: Once | INTRAMUSCULAR | Status: AC
  Administered 2013-09-02: 10 mg

## 2013-09-02 NOTE — Patient Instructions (Signed)
Bursitis Bursitis is a swelling and soreness (inflammation) of a fluid-filled sac (bursa) that overlies and protects a joint. It can be caused by injury, overuse of the joint, arthritis or infection. The joints most likely to be affected are the elbows, shoulders, hips and knees. HOME CARE INSTRUCTIONS   Apply ice to the affected area for 15-20 minutes each hour while awake for 2 days. Put the ice in a plastic bag and place a towel between the bag of ice and your skin.  Rest the injured joint as much as possible, but continue to put the joint through a full range of motion, 4 times per day. (The shoulder joint especially becomes rapidly "frozen" if not used.) When the pain lessens, begin normal slow movements and usual activities.  Only take over-the-counter or prescription medicines for pain, discomfort or fever as directed by your caregiver.  Your caregiver may recommend draining the bursa and injecting medicine into the bursa. This may help the healing process.  Follow all instructions for follow-up with your caregiver. This includes any orthopedic referrals, physical therapy and rehabilitation. Any delay in obtaining necessary care could result in a delay or failure of the bursitis to heal and chronic pain. SEEK IMMEDIATE MEDICAL CARE IF:   Your pain increases even during treatment.  You develop an oral temperature above 102 F (38.9 C) and have heat and inflammation over the involved bursa. MAKE SURE YOU:   Understand these instructions.  Will watch your condition.  Will get help right away if you are not doing well or get worse. Document Released: 05/09/2000 Document Revised: 08/04/2011 Document Reviewed: 04/13/2009 [employer] Patient Information 2014 [employer].  

## 2013-09-02 NOTE — Progress Notes (Signed)
   Subjective: [name] presents today for pain on the right foot at the head of the 5th metatarsal.  She states the pain is constant and she is unable to wean herself into her new shoes due ot the discomfort at this area.  She states her left foot is not bothering her today.  Also a hallux varus repair was performed as well as a  shortening second metatarsal osteotomy was performed on her left foot date of surgery was 05/02/2013. Her Great toe continues to drift and we have discussed the only option left is a fusion.  Today, there is no discomfort.  Objective: Neurovascular status is intact bilateral. Hyperkeratotic lesion present fifth metatarsal right foot. Swelling over the head of the fifth metatarsal is also present consistent with bursitis.  Assessment: Recommended an injection and the patient agreed. Injected Kenalog and Marcaine mixture under sterile technique to the lateral aspect of the fifth metatarsal head right. Dispensed a egg yolk pads for her use. She'll be seen back as needed for followup.

## 2013-09-05 ENCOUNTER — Telehealth: Payer: Self-pay | Admitting: *Deleted

## 2013-09-05 MED ORDER — ALBUTEROL SULFATE (2.5 MG/3ML) 0.083% IN NEBU: 2.5000 mg | INHALATION_SOLUTION | RESPIRATORY_TRACT | Status: DC | PRN

## 2013-09-05 NOTE — Telephone Encounter (Signed)
Pt left message stating she has not refilled her albuterol nebulizers since 2013 and would like rx sent to [facility]. Refill sent. Left message on cell# re: rx completion.

## 2013-09-09 ENCOUNTER — Ambulatory Visit: Payer: BC Managed Care – PPO | Admitting: Podiatrist

## 2013-09-16 ENCOUNTER — Other Ambulatory Visit: Payer: Self-pay | Admitting: Family

## 2013-09-17 ENCOUNTER — Other Ambulatory Visit: Payer: Self-pay | Admitting: Obstetrics & Gynecology

## 2013-09-19 NOTE — Telephone Encounter (Signed)
eScribe request from [facility] for refill on Tramadol 50 mg Last filled - 10.12.2014, #30x0 Last AEX - 03.27.15 [Acute], 01.26.15 [F/U] Next AEX - 6-Mths Please Advise on refills/SLS

## 2013-09-19 NOTE — Telephone Encounter (Signed)
eScribe request from [facility] for refill on NYSTATIN CREAM Last filled - 01/03/13, 30 G x 0 refills Last AEX - 01/03/13 Next AEX - 03/24/14 Please advise additional refills.

## 2013-09-19 NOTE — Telephone Encounter (Signed)
Rx called to pharmacy voicemail as below. 

## 2013-09-19 NOTE — Telephone Encounter (Signed)
Ok to send 30 tabs zero refills. 

## 2013-10-07 ENCOUNTER — Other Ambulatory Visit: Payer: Self-pay | Admitting: Family

## 2013-10-07 NOTE — Telephone Encounter (Signed)
Rx request to pharmacy/SLS  

## 2013-10-21 ENCOUNTER — Other Ambulatory Visit: Payer: Self-pay | Admitting: Family

## 2013-10-21 NOTE — Telephone Encounter (Signed)
Please advise re: tramadol refill. Rx last called in 09/19/13.  Please advise.

## 2013-10-24 NOTE — Telephone Encounter (Signed)
Rx called to pharmacy voicemail. 

## 2013-10-24 NOTE — Telephone Encounter (Signed)
Ok to send 30 tabs zero refills. 

## 2013-10-31 ENCOUNTER — Encounter: Payer: Self-pay | Admitting: Podiatrist

## 2013-10-31 ENCOUNTER — Ambulatory Visit (INDEPENDENT_AMBULATORY_CARE_PROVIDER_SITE_OTHER): Payer: BC Managed Care – PPO | Admitting: Podiatrist

## 2013-10-31 VITALS — BP 134/63 | HR 91 | Resp 19 | Ht 64.0 in | Wt 300.0 lb

## 2013-10-31 DIAGNOSIS — Q828 Other specified congenital malformations of skin: Secondary | ICD-10-CM

## 2013-10-31 DIAGNOSIS — M21619 Bunion of unspecified foot: Secondary | ICD-10-CM

## 2013-10-31 DIAGNOSIS — M21621 Bunionette of right foot: Secondary | ICD-10-CM

## 2013-10-31 NOTE — Progress Notes (Signed)
Subjective:  Marites presents for pain at the head of the 5th metatarsal right foot.  She states she noticed some bloody drainage and pain with pressure in a certain spot that was concerning.  She denies any local or systemic signs of infection.  She has been wearing a double padding to the area and she states it is helping.  She also still has a recurrence of the hallux varus on the left foot that was attempted to be fixed by my last December by soft tissue release and tendon augmentation.  We have spoken about doing a fusion for the joint however she states her balance is all that is affected by this toe and that it really no longer hurts her.  Objective: Neurovascular status is unchanged. Pedal pulses are palpable. She has a prominent fifth metatarsal head laterally of the right foot which is causing a friction blister against her shoe gear. She has hallux varus deformities bilaterally as well. Contraction of digits is also present bilaterally.  Assessment: Tailors bunion right foot, overlying hematoma  Plan: Recommended different padding options for her and she is weaning into her different shoes. Also discussed the possibility of shaving the bone to take the pressure off of the bony prominence. No injection was oriented at today's visit. She'll be seen back when necessary.

## 2013-11-01 ENCOUNTER — Other Ambulatory Visit: Payer: Self-pay | Admitting: Family

## 2013-11-09 ENCOUNTER — Ambulatory Visit: Payer: BC Managed Care – PPO | Admitting: Cardiology

## 2013-11-15 ENCOUNTER — Other Ambulatory Visit: Payer: Self-pay | Admitting: Cardiology

## 2013-11-15 ENCOUNTER — Other Ambulatory Visit: Payer: Self-pay | Admitting: Family

## 2013-11-16 NOTE — Telephone Encounter (Signed)
Refill request for tramadol Last filled by MD on- 10/24/2013 #30 x0 Last Appt: 08/19/2013 Next Appt: 12/20/2013 Please advise refill?

## 2013-11-16 NOTE — Telephone Encounter (Signed)
Rx faxed

## 2013-11-16 NOTE — Telephone Encounter (Signed)
OK to send 30 tabs zero refills.  

## 2013-11-23 ENCOUNTER — Ambulatory Visit (INDEPENDENT_AMBULATORY_CARE_PROVIDER_SITE_OTHER): Payer: BC Managed Care – PPO | Admitting: Cardiology

## 2013-11-23 ENCOUNTER — Encounter: Payer: Self-pay | Admitting: Cardiology

## 2013-11-23 VITALS — BP 138/80 | HR 94 | Ht 64.0 in | Wt 314.0 lb

## 2013-11-23 DIAGNOSIS — R609 Edema, unspecified: Secondary | ICD-10-CM | POA: Insufficient documentation

## 2013-11-23 DIAGNOSIS — I4891 Unspecified atrial fibrillation: Secondary | ICD-10-CM

## 2013-11-23 DIAGNOSIS — Z9289 Personal history of other medical treatment: Secondary | ICD-10-CM

## 2013-11-23 DIAGNOSIS — I480 Paroxysmal atrial fibrillation: Secondary | ICD-10-CM

## 2013-11-23 DIAGNOSIS — I10 Essential (primary) hypertension: Secondary | ICD-10-CM

## 2013-11-23 HISTORY — DX: Personal history of other medical treatment: Z92.89

## 2013-11-23 LAB — BASIC METABOLIC PANEL WITH GFR
BUN: 18 mg/dL (ref 6–23)
CO2: 28 meq/L (ref 19–32)
CREATININE: 0.8 mg/dL (ref 0.50–1.10)
Calcium: 8.2 mg/dL — ABNORMAL LOW (ref 8.4–10.5)
Chloride: 102 mEq/L (ref 96–112)
GFR, Est African American: >89 mL/min
GFR, Est Non African American: 83 mL/min
Glucose, Bld: 101 mg/dL — ABNORMAL HIGH (ref 70–99)
Potassium: 3.7 mEq/L (ref 3.5–5.3)
Sodium: 141 mEq/L (ref 135–145)

## 2013-11-23 LAB — CBC
HCT: 29.4 % — ABNORMAL LOW (ref 36.0–46.0)
Hemoglobin: 8.6 g/dL — ABNORMAL LOW (ref 12.0–15.0)
MCH: 21.1 pg — ABNORMAL LOW (ref 26.0–34.0)
MCHC: 29.3 g/dL — AB (ref 30.0–36.0)
MCV: 72.2 fL — ABNORMAL LOW (ref 78.0–100.0)
Platelets: 258 10*3/uL (ref 150–400)
RBC: 4.07 MIL/uL (ref 3.87–5.11)
RDW: 19.5 % — ABNORMAL HIGH (ref 11.5–15.5)
WBC: 8.2 10*3/uL (ref 4.0–10.5)

## 2013-11-23 NOTE — Assessment & Plan Note (Signed)
Blood pressure controlled. Continue present medications. 

## 2013-11-23 NOTE — Assessment & Plan Note (Signed)
Continue present dose of diuretics.Check potassium and renal function. Patient instructed to keep legs elevated.

## 2013-11-23 NOTE — Assessment & Plan Note (Signed)
We discussed weight loss. 

## 2013-11-23 NOTE — Assessment & Plan Note (Signed)
Patient remains in sinus rhythm. Several brief episodes of palpitations but not sustained. Continue Cardizem. Continue xarelto. Check renal function and hemoglobin

## 2013-11-23 NOTE — Progress Notes (Signed)
HPI: FU atrial fibrillation. I initially saw her in July of 2012 for evaluation of chest pain. Echocardiogram in January of 2012 showed normal LV function, mild left ventricular hypertrophy and mild left atrial enlargement. CTA showed no pulmonary embolus in July 2012. Patient had a Myoview in July of 2012. Her ejection fraction is 64%. Patient had mild ischemia in the inferior wall. We elected to treat medically. Patient was also having palpitations and near syncopal episodes. A CardioNet revealed sinus rhythm with PACs, brief runs of PAT and brief runs of PAF. MRA in September of 2014 showed no aneurysm.I last saw her in June of 2014. Since then, She denies dyspnea or chest pain. She has had 3 episodes of brief palpitations lasting seconds. Not sustained.  Current Outpatient Prescriptions  Medication Sig Dispense Refill  . albuterol (PROVENTIL HFA;VENTOLIN HFA) 108 (90 BASE) MCG/ACT inhaler Inhale 2 puffs into the lungs every 6 (six) hours as needed for wheezing.  1 Inhaler  2  . albuterol (PROVENTIL) (2.5 MG/3ML) 0.083% nebulizer solution Take 3 mLs (2.5 mg total) by nebulization every 4 (four) hours as needed for wheezing.  75 mL  1  . Ascorbic Acid (VITAMIN C CR) 1000 MG TBCR Take 1 tablet by mouth 2 (two) times a week. On Tuesday and Thursday.      . Black Cohosh 40 MG CAPS Take 1 capsule by mouth daily.       . calcitRIOL (ROCALTROL) 0.5 MCG capsule Take 0.411 mcg by mouth 2 (two) times daily.       . calcium carbonate (TUMS - DOSED IN MG ELEMENTAL CALCIUM) 500 MG chewable tablet Chew 1 tablet by mouth daily.       . cetirizine (ZYRTEC) 10 MG tablet Take 10 mg by mouth daily.      [name] diltiazem (CARTIA XT) 240 MG 24 hr capsule Take 1 capsule (240 mg total) by mouth daily.  90 capsule  1  . ergocalciferol (VITAMIN D2) 50000 UNITS capsule Take 50,000 Units by mouth 2 (two) times a week. On Saturday and Wednesday      . estradiol (ESTRACE) 0.1 MG/GM vaginal cream Also apply externally two to  three times weekly.  42.5 g  6  . fluticasone (FLONASE) 50 MCG/ACT nasal spray USE 2 SPRAYS IN EACH NOSTRIL EVERY DAY  16 g  3  . furosemide (LASIX) 40 MG tablet TAKE 1 TABLET BY MOUTH EVERY DAY  30 tablet  0  . hydrochlorothiazide (HYDRODIURIL) 25 MG tablet TAKE 1 TABLET BY MOUTH EVERY DAY  30 tablet  0  . iron polysaccharides (NU-IRON) 150 MG capsule Take 150 mg by mouth 2 (two) times a week. Every Saturday and Wednesday      . levothyroxine (SYNTHROID, LEVOTHROID) 137 MCG tablet Take 411 mcg by mouth every morning.       . methocarbamol (ROBAXIN) 500 MG tablet       . Omeprazole-Sodium Bicarbonate (ZEGERID) 20-1100 MG CAPS Take 1 capsule by mouth daily before breakfast.       . POTASSIUM CITRATE PO 1080mg .  Pt takes 4 - 6 pills daily.      . SYMBICORT 160-4.5 MCG/ACT inhaler INHALE 2 PUFFS TWICE DAILY  10.2 g  1  . traMADol (ULTRAM) 50 MG tablet TAKE 1 TABLET BY MOUTH EVERY 6 HOURS AS NEEDED  30 tablet  0  . UNABLE TO FIND MANGANESE 60MG  TAKE ONE PILL WITH EACH MEAL THREE TIMES DAILY      .  valsartan (DIOVAN) 80 MG tablet TAKE 1 TABLET BY MOUTH EVERY DAY  30 tablet  0  . vitamin B-12 (CYANOCOBALAMIN) 1000 MCG tablet Take 1,000 mcg by mouth 2 (two) times a week. On Tuesday and Thursday with Iron.      . Vitamin D, Ergocalciferol, (DRISDOL) 50000 UNITS CAPS capsule Take by mouth.      . vitamin E (VITAMIN E) 1000 UNIT capsule Take 1 two times a week.      Alveda Reasons 20 MG TABS tablet TAKE 1 TABLET BY MOUTH DAILY  30 tablet  3  . zafirlukast (ACCOLATE) 20 MG tablet Take 1 tablet (20 mg total) by mouth 2 (two) times daily.  60 tablet  5   No current facility-administered medications for this visit.     Past Medical History  Diagnosis Date  . Allergic rhinitis   . Asthma   . Hypertension   . Hypothyroidism   . Anemia, iron deficiency   . Vitamin D deficiency   . Thyroid cancer   . Morbid obesity   . GERD (gastroesophageal reflux disease)   . Hypoparathyroidism 01/07/2011  . Arthritis     . Fatty liver 01/07/2012  . Headache(784.0)     occasional sinus headache   . Atrial fibrillation August 2012  . Chronic kidney disease     kidnsy stones  . PONV (postoperative nausea and vomiting)   . History of blood transfusion   . Fibroid 1974    fibroid cyst on left fallopian tube  . Abnormal Pap smear     years ago/no biopsy  . Nephrolithiasis 2012  . Bursitis of hip left  . Edema of both legs   . Seizures     infancy secondary to fever  . Fibromyalgia   . Enlarged liver     Past Surgical History  Procedure Laterality Date  . Tonsillectomy and adenoidectomy    . Bunionectomy      bilateral  . Appendectomy    . Gastric bypass  1974  . Thyroidectomy  05/15/2010  . Cyst on ovary removed     . Ureteroscopy  03/03/2012    Procedure: URETEROSCOPY;  Surgeon: Bernestine Amass, MD;  Location: [facility];  Service: Urology;  Laterality: Left;  . Cystoscopy w/ retrogrades  03/03/2012    Procedure: CYSTOSCOPY WITH RETROGRADE PYELOGRAM;  Surgeon: Bernestine Amass, MD;  Location: [facility];  Service: Urology;  Laterality: Bilateral;  . Lithotripsy  03/2012  . Cystoscopy with retrograde pyelogram, ureteroscopy and stent placement Left 08/25/2012    Procedure: CYSTOSCOPY WITH RETROGRADE PYELOGRAM, URETEROSCOPY;  Surgeon: Bernestine Amass, MD;  Location: [facility];  Service: Urology;  Laterality: Left;  . Holmium laser application Left 6/0/6301    Procedure: HOLMIUM LASER APPLICATION;  Surgeon: Bernestine Amass, MD;  Location: [facility];  Service: Urology;  Laterality: Left;  . Dilation and curettage of uterus N/A 02/21/2013    Procedure: DILATATION AND CURETTAGE;  Surgeon: Lyman Speller, MD;  Location: [facility];  Service: Gynecology;  Laterality: N/A;    History   Social History  . Marital Status: Single    Spouse Name: N/A    Number of Children: 0  . Years of Education: N/A   Occupational History  . works in Insurance claims handler   . DESIGN COMPUTER [employer]    Social History Main Topics  . Smoking  status: Former Smoker -- 10 years    Quit date: 05/27/1995  . Smokeless tobacco: Never Used  . Alcohol  Use: Yes     Comment: 1/2 glass  . Drug Use: No  . Sexual Activity: Yes    Partners: Male   Other Topics Concern  . Not on file   Social History Narrative   Occupation: works in Insurance claims handler - Field seismologist   Single       Former Smoker - quit tobacco 12 years ago.  She was light smoker for 10 years.                 ROS: Pedal edema butno fevers or chills, productive cough, hemoptysis, dysphasia, odynophagia, melena, hematochezia, dysuria, hematuria, rash, seizure activity, orthopnea, PND, claudication. Remaining systems are negative.  Physical Exam: Well-developed obese in no acute distress.  Skin is warm and dry.  HEENT is normal.  Neck is supple.  Chest is clear to auscultation with normal expansion.  Cardiovascular exam is regular rate and rhythm.  Abdominal exam nontender or distended. No masses palpated. Extremities show 1+ edema. neuro grossly intact  ECG Sinus rhythm at a rate of 94. No ST changes

## 2013-11-23 NOTE — Patient Instructions (Signed)
Your physician wants you to follow-up in: 6 MONTHS WITH DR CRENSHAW You will receive a reminder letter in the mail two months in advance. If you don't receive a letter, please call our office to schedule the follow-up appointment.   Your physician recommends that you HAVE LAB WORK TODAY 

## 2013-11-24 ENCOUNTER — Telehealth: Payer: Self-pay | Admitting: Family

## 2013-11-24 NOTE — Telephone Encounter (Signed)
Please contact pt and arrange follow up early this week for anemia.

## 2013-11-28 NOTE — Telephone Encounter (Signed)
Please contact pt and let her know that I am concerned re: her anemia.  Hgb is 8.6.  If hemoglobin drops much more, we may need to consider a blood transfusion.  I would also like to do a hemocult test while she is here to check for blood in stool and try to determine cause of her anemia.  I would not advise her to wait until 7/28.

## 2013-11-28 NOTE — Telephone Encounter (Signed)
OK 

## 2013-11-28 NOTE — Telephone Encounter (Signed)
Patient states that she does not want to come in this week. She already has appointment for 12/20/13 and does not want to come in any earlier than that appointment

## 2013-11-28 NOTE — Telephone Encounter (Signed)
Spoke with pt and she voices understanding. Scheduled f/u for Friday at 1:15pm. I did not see any other opening on our schedule for this week. Is this ok?

## 2013-12-01 ENCOUNTER — Other Ambulatory Visit: Payer: Self-pay | Admitting: Family

## 2013-12-02 ENCOUNTER — Telehealth: Payer: Self-pay | Admitting: *Deleted

## 2013-12-02 ENCOUNTER — Other Ambulatory Visit: Payer: Self-pay | Admitting: Family

## 2013-12-02 ENCOUNTER — Encounter: Payer: Self-pay | Admitting: Family

## 2013-12-02 ENCOUNTER — Ambulatory Visit (INDEPENDENT_AMBULATORY_CARE_PROVIDER_SITE_OTHER): Payer: BC Managed Care – PPO | Admitting: Family

## 2013-12-02 VITALS — BP 128/70 | HR 93 | Temp 98.5°F | Resp 16 | Ht 64.0 in | Wt 311.0 lb

## 2013-12-02 DIAGNOSIS — R195 Other fecal abnormalities: Secondary | ICD-10-CM

## 2013-12-02 DIAGNOSIS — D62 Acute posthemorrhagic anemia: Secondary | ICD-10-CM

## 2013-12-02 LAB — IRON AND TIBC
%SAT: 6 % — ABNORMAL LOW (ref 20–55)
Iron: 20 ug/dL — ABNORMAL LOW (ref 42–145)
TIBC: 341 ug/dL (ref 250–470)
UIBC: 321 ug/dL (ref 125–400)

## 2013-12-02 LAB — POC HEMOCCULT BLD/STL (OFFICE/1-CARD/DIAGNOSTIC): Fecal Occult Blood, POC: POSITIVE

## 2013-12-02 NOTE — Progress Notes (Signed)
Subjective:    Patient ID: [name], female    DOB: May 10, 1956, 58 y.o.   MRN: [id number]  HPI  [name] is a 58 yr old female with hx of AF on xarelto and anemia, who recently had worsening anemia noted at her cardiology visit last week.  Lab Results  Component Value Date   WBC 8.2 11/23/2013   HGB 8.6* 11/23/2013   HCT 29.4* 11/23/2013   MCV 72.2* 11/23/2013   PLT 258 11/23/2013    Pt has decreased hb/hct from the last cbc. Pt states some kidney stones but not recent visible hematuria. She has seen urologist has had cystoscopy sept 2014 approximate and negative result per pt. Pt is taking iron and her stools are dark.(Not bloody) Pt is take new iron 150 mg q day. Pt increased from prior dosing of 150 mg every 3rd day. Her energy is progressively getting better over past week.   Review of Systems See HPI  Past Medical History  Diagnosis Date  . Allergic rhinitis   . Asthma   . Hypertension   . Hypothyroidism   . Anemia, iron deficiency   . Vitamin D deficiency   . Thyroid cancer   . Morbid obesity   . GERD (gastroesophageal reflux disease)   . Hypoparathyroidism 01/07/2011  . Arthritis   . Fatty liver 01/07/2012  . Headache(784.0)     occasional sinus headache   . Atrial fibrillation August 2012  . Chronic kidney disease     kidnsy stones  . PONV (postoperative nausea and vomiting)   . History of blood transfusion   . Fibroid 1974    fibroid cyst on left fallopian tube  . Abnormal Pap smear     years ago/no biopsy  . Nephrolithiasis 2012  . Bursitis of hip left  . Edema of both legs   . Seizures     infancy secondary to fever  . Fibromyalgia   . Enlarged liver     History   Social History  . Marital Status: Single    Spouse Name: N/A    Number of Children: 0  . Years of Education: N/A   Occupational History  . works in Insurance claims handler   . [employer]    Social History Main Topics  . Smoking status: Former Smoker -- 10 years    Quit  date: 05/27/1995  . Smokeless tobacco: Never Used  . Alcohol Use: Yes     Comment: 1/2 glass  . Drug Use: No  . Sexual Activity: Yes    Partners: Male   Other Topics Concern  . Not on file   Social History Narrative   Occupation: works in Insurance claims handler - Field seismologist   Single       Former Smoker - quit tobacco 12 years ago.  She was light smoker for 10 years.                 Past Surgical History  Procedure Laterality Date  . Tonsillectomy and adenoidectomy    . Bunionectomy      bilateral  . Appendectomy    . Gastric bypass  1974  . Thyroidectomy  05/15/2010  . Cyst on ovary removed     . Ureteroscopy  03/03/2012    Procedure: URETEROSCOPY;  Surgeon: Bernestine Amass, MD;  Location: [facility];  Service: Urology;  Laterality: Left;  . Cystoscopy w/ retrogrades  03/03/2012    Procedure: CYSTOSCOPY WITH RETROGRADE PYELOGRAM;  Surgeon:  Bernestine Amass, MD;  Location: [facility];  Service: Urology;  Laterality: Bilateral;  . Lithotripsy  03/2012  . Cystoscopy with retrograde pyelogram, ureteroscopy and stent placement Left 08/25/2012    Procedure: CYSTOSCOPY WITH RETROGRADE PYELOGRAM, URETEROSCOPY;  Surgeon: Bernestine Amass, MD;  Location: [facility];  Service: Urology;  Laterality: Left;  . Holmium laser application Left 02/01/8337    Procedure: HOLMIUM LASER APPLICATION;  Surgeon: Bernestine Amass, MD;  Location: [facility];  Service: Urology;  Laterality: Left;  . Dilation and curettage of uterus N/A 02/21/2013    Procedure: DILATATION AND CURETTAGE;  Surgeon: Lyman Speller, MD;  Location: [facility];  Service: Gynecology;  Laterality: N/A;    Family History  Problem Relation Age of Onset  . Hypertension Father   . Diabetes Father   . Lung cancer Father   . Heart attack Father     MI at age 63  . Hypertension Mother   . Hyperthyroidism Mother   . Heart disease Mother   . Heart attack Mother     MI at age 43  . Asthma Brother   . Hypertension Brother     younger  . Heart  disease Brother     older    Allergies  Allergen Reactions  . Other Anaphylaxis    NUTS  . Amoxicillin-Pot Clavulanate Other (See Comments)    headache  . Penicillins Other (See Comments)    headache    Current Outpatient Prescriptions on File Prior to Visit  Medication Sig Dispense Refill  . albuterol (PROVENTIL HFA;VENTOLIN HFA) 108 (90 BASE) MCG/ACT inhaler Inhale 2 puffs into the lungs every 6 (six) hours as needed for wheezing.  1 Inhaler  2  . albuterol (PROVENTIL) (2.5 MG/3ML) 0.083% nebulizer solution Take 3 mLs (2.5 mg total) by nebulization every 4 (four) hours as needed for wheezing.  75 mL  1  . Ascorbic Acid (VITAMIN C CR) 1000 MG TBCR Take 1 tablet by mouth 2 (two) times a week. On Tuesday and Thursday.      . Black Cohosh 40 MG CAPS Take 1 capsule by mouth daily.       . calcitRIOL (ROCALTROL) 0.5 MCG capsule Take 0.411 mcg by mouth 2 (two) times daily.       . calcium carbonate (TUMS - DOSED IN MG ELEMENTAL CALCIUM) 500 MG chewable tablet Chew 1 tablet by mouth daily.       . cetirizine (ZYRTEC) 10 MG tablet Take 10 mg by mouth daily.      [name] diltiazem (CARTIA XT) 240 MG 24 hr capsule Take 1 capsule (240 mg total) by mouth daily.  90 capsule  1  . ergocalciferol (VITAMIN D2) 50000 UNITS capsule Take 50,000 Units by mouth 2 (two) times a week. On Saturday and Wednesday      . estradiol (ESTRACE) 0.1 MG/GM vaginal cream Also apply externally two to three times weekly.  42.5 g  6  . fluticasone (FLONASE) 50 MCG/ACT nasal spray USE 2 SPRAYS IN EACH NOSTRIL EVERY DAY  16 g  3  . furosemide (LASIX) 40 MG tablet TAKE 1 TABLET BY MOUTH EVERY DAY  30 tablet  0  . hydrochlorothiazide (HYDRODIURIL) 25 MG tablet TAKE 1 TABLET BY MOUTH EVERY DAY  30 tablet  0  . iron polysaccharides (NU-IRON) 150 MG capsule Take 150 mg by mouth daily.       [name] levothyroxine (SYNTHROID, LEVOTHROID) 137 MCG tablet Take 411 mcg by mouth every morning.       [name]  methocarbamol (ROBAXIN) 500 MG tablet         . Omeprazole-Sodium Bicarbonate (ZEGERID) 20-1100 MG CAPS Take 1 capsule by mouth daily before breakfast.       . POTASSIUM CITRATE PO 1080mg .  Pt takes 4 - 6 pills daily.      . SYMBICORT 160-4.5 MCG/ACT inhaler INHALE 2 PUFFS TWICE DAILY  10.2 g  1  . traMADol (ULTRAM) 50 MG tablet TAKE 1 TABLET BY MOUTH EVERY 6 HOURS AS NEEDED  30 tablet  0  . UNABLE TO FIND MANGANESE 60MG  TAKE ONE PILL WITH EACH MEAL THREE TIMES DAILY      . valsartan (DIOVAN) 80 MG tablet TAKE 1 TABLET BY MOUTH EVERY DAY  30 tablet  0  . vitamin B-12 (CYANOCOBALAMIN) 1000 MCG tablet Take 1,000 mcg by mouth 2 (two) times a week. On Tuesday and Thursday with Iron.      . Vitamin D, Ergocalciferol, (DRISDOL) 50000 UNITS CAPS capsule Take by mouth.      . vitamin E (VITAMIN E) 1000 UNIT capsule Take 1 two times a week.      Alveda Reasons 20 MG TABS tablet TAKE 1 TABLET BY MOUTH DAILY  30 tablet  3  . zafirlukast (ACCOLATE) 20 MG tablet Take 1 tablet (20 mg total) by mouth 2 (two) times daily.  60 tablet  5   No current facility-administered medications on file prior to visit.    BP 128/70  Pulse 93  Temp(Src) 98.5 F (36.9 C) (Oral)  Resp 16  Ht 5\' 4"  (1.626 m)  Wt 311 lb (141.069 kg)  BMI 53.36 kg/m2  SpO2 99%  LMP 05/26/2009       Objective:   Physical Exam  Constitutional: She appears well-developed and well-nourished.  HENT:  Head: Normocephalic.  Eyes: Pupils are equal, round, and reactive to light.  Neck: Neck supple.  Cardiovascular: Normal rate, regular rhythm and normal heart sounds.  Exam reveals no gallop and no friction rub.   No murmur heard. Pulmonary/Chest: Effort normal. No respiratory distress. She has no wheezes. She has no rales. She exhibits no tenderness.  Abdominal: Soft. Bowel sounds are normal. She exhibits no distension and no mass. There is no tenderness. There is no rebound and no guarding.  Genitourinary: Rectal exam shows no external hemorrhoid. Guaiac positive stool.  Pt had  normal sphincter tone. No masses.  Skin: Skin is warm. She is not diaphoretic.  Psychiatric: She has a normal mood and affect. Her behavior is normal. Judgment and thought content normal.          Assessment & Plan:  Patient seen along with Evern Core PA-C who is currently in orientation for training purposes. I have personally examined pt and agree with assessment and plan.

## 2013-12-02 NOTE — Patient Instructions (Signed)
You will be contacted about your referral to GI and Hematology.  Please let us know if you have not heard back within 1 week about your referral. Please complete lab work prior to leaving.  Anemia, Nonspecific Anemia is a condition in which the concentration of red blood cells or hemoglobin in the blood is below normal. Hemoglobin is a substance in red blood cells that carries oxygen to the tissues of the body. Anemia results in not enough oxygen reaching these tissues.  CAUSES  Common causes of anemia include:   Excessive bleeding. Bleeding may be internal or external. This includes excessive bleeding from periods (in women) or from the intestine.   Poor nutrition.   Chronic kidney, thyroid, and liver disease.  Bone marrow disorders that decrease red blood cell production.  Cancer and treatments for cancer.  HIV, AIDS, and their treatments.  Spleen problems that increase red blood cell destruction.  Blood disorders.  Excess destruction of red blood cells due to infection, medicines, and autoimmune disorders. SIGNS AND SYMPTOMS   Minor weakness.   Dizziness.   Headache.  Palpitations.   Shortness of breath, especially with exercise.   Paleness.  Cold sensitivity.  Indigestion.  Nausea.  Difficulty sleeping.  Difficulty concentrating. Symptoms may occur suddenly or they may develop slowly.  DIAGNOSIS  Additional blood tests are often needed. These help your health care provider determine the best treatment. Your health care provider will check your stool for blood and look for other causes of blood loss.  TREATMENT  Treatment varies depending on the cause of the anemia. Treatment can include:   Supplements of iron, vitamin G25, or folic acid.   Hormone medicines.   A blood transfusion. This may be needed if blood loss is severe.   Hospitalization. This may be needed if there is significant continual blood loss.   Dietary changes.  Spleen  removal. HOME CARE INSTRUCTIONS Keep all follow-up appointments. It often takes many weeks to correct anemia, and having your health care provider check on your condition and your response to treatment is very important. SEEK IMMEDIATE MEDICAL CARE IF:   You develop extreme weakness, shortness of breath, or chest pain.   You become dizzy or have trouble concentrating.  You develop heavy vaginal bleeding.   You develop a rash.   You have bloody or black, tarry stools.   You faint.   You vomit up blood.   You vomit repeatedly.   You have abdominal pain.  You have a fever or persistent symptoms for more than 2-3 days.   You have a fever and your symptoms suddenly get worse.   You are dehydrated.  MAKE SURE YOU:  Understand these instructions.  Will watch your condition.  Will get help right away if you are not doing well or get worse. Document Released: 06/19/2004 Document Revised: 01/12/2013 Document Reviewed: 11/05/2012 [facility] Patient Information 2015 [place], [place]. This information is not intended to replace advice given to you by your health care provider. Make sure you discuss any questions you have with your health care provider.

## 2013-12-02 NOTE — Telephone Encounter (Signed)
Message copied by Debbrah Alar on Fri Dec 02, 2013  2:36 PM ------      Message from: Synthia Innocent      Created: Fri Dec 02, 2013  2:31 PM       Ok Thanks      ----- Message -----         From: Debbrah Alar, NP         Sent: 12/02/2013   2:06 PM           To: Synthia Innocent            Needs gi referral next week please. thanks       ------

## 2013-12-02 NOTE — Telephone Encounter (Signed)
Completed handicap parking application for pt and mailed to her home address.

## 2013-12-02 NOTE — Assessment & Plan Note (Signed)
Hx of anemia. Iron deficiency. HB/HCT is dropping. And Guiaic positive stool card. Will get cbc today. Refer to gastoenterology. If further decline in H/H may need to d/c xarelto.  Will refer back to hematology (has received IV iron infusions in the past). Suspect upper gi source as she has hx of gastric bypass.

## 2013-12-02 NOTE — Progress Notes (Signed)
Pre visit review using our clinic review tool, if applicable. No additional management support is needed unless otherwise documented below in the visit note. 

## 2013-12-03 ENCOUNTER — Telehealth: Payer: Self-pay | Admitting: Family

## 2013-12-03 LAB — CBC WITH DIFFERENTIAL/PLATELET
BASOS ABS: 0.1 10*3/uL (ref 0.0–0.1)
BASOS PCT: 1 % (ref 0–1)
EOS ABS: 0.2 10*3/uL (ref 0.0–0.7)
Eosinophils Relative: 2 % (ref 0–5)
HCT: 26.8 % — ABNORMAL LOW (ref 36.0–46.0)
Hemoglobin: 7.8 g/dL — ABNORMAL LOW (ref 12.0–15.0)
Lymphocytes Relative: 20 % (ref 12–46)
Lymphs Abs: 1.8 10*3/uL (ref 0.7–4.0)
MCH: 20.9 pg — ABNORMAL LOW (ref 26.0–34.0)
MCHC: 29.1 g/dL — ABNORMAL LOW (ref 30.0–36.0)
MCV: 71.7 fL — ABNORMAL LOW (ref 78.0–100.0)
Monocytes Absolute: 0.6 10*3/uL (ref 0.1–1.0)
Monocytes Relative: 7 % (ref 3–12)
Neutro Abs: 6.4 10*3/uL (ref 1.7–7.7)
Neutrophils Relative %: 70 % (ref 43–77)
PLATELETS: 245 10*3/uL (ref 150–400)
RBC: 3.74 MIL/uL — ABNORMAL LOW (ref 3.87–5.11)
RDW: 20.3 % — AB (ref 11.5–15.5)
WBC: 9.2 10*3/uL (ref 4.0–10.5)

## 2013-12-03 NOTE — Telephone Encounter (Signed)
Reviewed CBC.  Attempted to reach patient.  No answer.  Left detailed message on cell re: decrease in Hemoglobin. Advised pt to hold xarelto and that we would work on arranging outpatient transfusion.  Gilmore Laroche, could you please help me get orders arranged from short stay for blood transfusion early this week?

## 2013-12-05 ENCOUNTER — Encounter (HOSPITAL_COMMUNITY)
Admission: RE | Admit: 2013-12-05 | Discharge: 2013-12-05 | Disposition: A | Discharge status: Home / Self Care | Payer: BC Managed Care – PPO | Source: Ambulatory Visit | Attending: Family | Admitting: Family

## 2013-12-05 ENCOUNTER — Other Ambulatory Visit (HOSPITAL_COMMUNITY): Payer: Self-pay | Admitting: *Deleted

## 2013-12-05 DIAGNOSIS — D62 Acute posthemorrhagic anemia: Secondary | ICD-10-CM | POA: Insufficient documentation

## 2013-12-05 LAB — ABO/RH: ABO/RH(D): O POS

## 2013-12-05 LAB — PREPARE RBC (CROSSMATCH)

## 2013-12-05 NOTE — Telephone Encounter (Signed)
Spoke with short stay at [facility]. Pt will need to register at the main entrance and ask for medical day for appt at 12:45pm today for blood type and cross. They will proceed with transfusion tomorrow 12/06/13 at 8am. Left this detailed message on pt's cell # and to call to confirm appts. [facility] will fax over orders to be completed and faxed back to [phone].

## 2013-12-05 NOTE — Telephone Encounter (Signed)
Pt left message confirming lab appt today at 12:45 and transfusion tomorrow at 8am. Orders faxed to below number at 10am.

## 2013-12-06 ENCOUNTER — Telehealth: Payer: Self-pay | Admitting: Family

## 2013-12-06 ENCOUNTER — Encounter (HOSPITAL_COMMUNITY)
Admission: RE | Admit: 2013-12-06 | Discharge: 2013-12-06 | Disposition: A | Discharge status: Home / Self Care | Payer: BC Managed Care – PPO | Source: Ambulatory Visit | Attending: Family | Admitting: Family

## 2013-12-06 ENCOUNTER — Encounter: Payer: Self-pay | Admitting: Nurse Practitioner

## 2013-12-06 DIAGNOSIS — D62 Acute posthemorrhagic anemia: Secondary | ICD-10-CM | POA: Diagnosis not present

## 2013-12-06 MED ORDER — FUROSEMIDE 10 MG/ML IJ SOLN
20.0000 mg | Freq: Once | INTRAMUSCULAR | Status: AC
  Administered 2013-12-06: 20 mg via INTRAVENOUS
  Filled 2013-12-06: qty 4

## 2013-12-06 NOTE — Telephone Encounter (Signed)
Called GI  The soonest they would schedule is for 7-22 (Next Wednesday ) @8 :30 with the PA.

## 2013-12-07 ENCOUNTER — Ambulatory Visit (INDEPENDENT_AMBULATORY_CARE_PROVIDER_SITE_OTHER): Payer: BC Managed Care – PPO | Admitting: Podiatrist

## 2013-12-07 ENCOUNTER — Encounter: Payer: Self-pay | Admitting: Podiatrist

## 2013-12-07 VITALS — BP 153/80 | HR 86 | Resp 12

## 2013-12-07 DIAGNOSIS — L97509 Non-pressure chronic ulcer of other part of unspecified foot with unspecified severity: Secondary | ICD-10-CM

## 2013-12-07 DIAGNOSIS — M21621 Bunionette of right foot: Secondary | ICD-10-CM

## 2013-12-07 DIAGNOSIS — M21619 Bunion of unspecified foot: Secondary | ICD-10-CM

## 2013-12-07 LAB — TYPE AND SCREEN
ABO/RH(D): O POS
ANTIBODY SCREEN: NEGATIVE
UNIT DIVISION: 0
Unit division: 0

## 2013-12-07 MED ORDER — CLINDAMYCIN HCL 300 MG PO CAPS: 300.0000 mg | ORAL_CAPSULE | Freq: Three times a day (TID) | ORAL | Status: DC

## 2013-12-07 NOTE — Telephone Encounter (Signed)
Repeat CBC on Thursday of this week. Dx anemia. Please notify pt.

## 2013-12-07 NOTE — Telephone Encounter (Signed)
Notified pt and she voices understanding. Lab order entered for tomorrow.

## 2013-12-07 NOTE — Telephone Encounter (Signed)
Spoke with Alyse Low at GI office. She states they have no availability with anyone in their clinic this week. Next Wednesday is the first available they have. Nurse advised [place] to send pt to the ER if they can not wait until Wednesday. No Provider was available in the office at time of call but is expected to be there around 8:30.

## 2013-12-07 NOTE — Patient Instructions (Signed)
Apply the orange stuff to the foot and cover daily.  Keep staying off it as much as you can as you have been doing-- your rx has been called into your pharmacy for you.

## 2013-12-08 ENCOUNTER — Encounter: Payer: Self-pay | Admitting: Family

## 2013-12-08 LAB — CBC WITH DIFFERENTIAL/PLATELET
Basophils Absolute: 0 10*3/uL (ref 0.0–0.1)
Basophils Relative: 0 % (ref 0–1)
EOS ABS: 0.2 10*3/uL (ref 0.0–0.7)
Eosinophils Relative: 3 % (ref 0–5)
HCT: 31.7 % — ABNORMAL LOW (ref 36.0–46.0)
HEMOGLOBIN: 9.7 g/dL — AB (ref 12.0–15.0)
LYMPHS ABS: 1.5 10*3/uL (ref 0.7–4.0)
LYMPHS PCT: 18 % (ref 12–46)
MCH: 22.4 pg — ABNORMAL LOW (ref 26.0–34.0)
MCHC: 30.6 g/dL (ref 30.0–36.0)
MCV: 73 fL — AB (ref 78.0–100.0)
MONOS PCT: 6 % (ref 3–12)
Monocytes Absolute: 0.5 10*3/uL (ref 0.1–1.0)
NEUTROS PCT: 73 % (ref 43–77)
Neutro Abs: 5.9 10*3/uL (ref 1.7–7.7)
Platelets: 230 10*3/uL (ref 150–400)
RBC: 4.34 MIL/uL (ref 3.87–5.11)
RDW: 20.3 % — ABNORMAL HIGH (ref 11.5–15.5)
WBC: 8.1 10*3/uL (ref 4.0–10.5)

## 2013-12-13 ENCOUNTER — Other Ambulatory Visit: Payer: Self-pay | Admitting: Family

## 2013-12-13 NOTE — Telephone Encounter (Signed)
Please advise tramadol refill:  Medication name:  Name from pharmacy:  traMADol (ULTRAM) 50 MG tablet  TRAMADOL 50MG  TABLETS Sig: TAKE 1 TABLET BY MOUTH EVERY 6 HOURS AS NEEDED Dispense: 30 tablet Refills: 0 Start: 12/13/2013 Class: Normal Requested on: 12/13/2013 Originally ordered on: 02/16/2013 Last refill: 11/16/2013

## 2013-12-14 ENCOUNTER — Telehealth: Payer: Self-pay | Admitting: *Deleted

## 2013-12-14 ENCOUNTER — Ambulatory Visit (INDEPENDENT_AMBULATORY_CARE_PROVIDER_SITE_OTHER): Payer: BC Managed Care – PPO | Admitting: Nurse Practitioner

## 2013-12-14 ENCOUNTER — Ambulatory Visit: Payer: BC Managed Care – PPO | Admitting: Podiatrist

## 2013-12-14 ENCOUNTER — Encounter: Payer: Self-pay | Admitting: Nurse Practitioner

## 2013-12-14 VITALS — BP 160/70 | HR 96 | Ht 64.0 in | Wt 309.0 lb

## 2013-12-14 DIAGNOSIS — D509 Iron deficiency anemia, unspecified: Secondary | ICD-10-CM

## 2013-12-14 MED ORDER — MOVIPREP 100 G PO SOLR: 1.0000 | ORAL | Status: DC

## 2013-12-14 NOTE — Telephone Encounter (Signed)
Ok to hold xarelto for procedure [employer]

## 2013-12-14 NOTE — Telephone Encounter (Signed)
OK to send 30 tabs zero refills.  

## 2013-12-14 NOTE — Telephone Encounter (Signed)
12/14/2013   RE: [name] DOB: November 30, 1955 MRN: [id number]   Dear Kirk Ruths,    We have scheduled the above patient for an endoscopic procedure. Our records show that she is on anticoagulation therapy. However, she told us that her primary care physician took her off the Xarelto 3 weeks ago.   We have her scheduled for a Colonscopy with Dr. Scarlette Shorts on 02-02-2014 for progressive anemia. Are you in agreement for her to be off her Xarelto medication.  Please fax back/ or route the completed form to [place] at [phone].   Sincerely,    Tye Savoy NP-C   [place]

## 2013-12-14 NOTE — Progress Notes (Signed)
Subjective: [name] presents for follow up of ulceration at the head of the 5th metatarsal right foot. She states she has noticed improvement with local wound care.  She denies any local or systemic signs of infection.   Objective: Neurovascular status is unchanged. Pedal pulses are palpable. She has a prominent fifth metatarsal head laterally of the right foot which is causing friction against her shoe gear. She has hallux varus deformities bilaterally as well. Contraction of digits is also present bilaterally.   Assessment: Tailors bunion right foot, ulceration from blister  Plan: debridement of ulceration is performed and iodosorb is applied with a dressing.  She will stay off the foot and I will se her back in a week for follow up

## 2013-12-14 NOTE — Patient Instructions (Addendum)
If you continue to have loose stool then we should check stool studies to exclude bowel infection which can occur with Clinmycin. You have been scheduled for a colonoscopy. Please follow written instructions given to you at your visit today.  Please pick up your prep kit at the pharmacy within the next 1-3 days. If you use inhalers (even only as needed), please bring them with you on the day of your procedure. Your physician has requested that you go to www.startemmi.com and enter the access code given to you at your visit today. This web site gives a general overview about your procedure. However, you should still follow specific instructions given to you by our office regarding your preparation for the procedure.  We are sending Dr. Stanford Breed a letter about the Xarelto medication and advising him of your colonscopy scheduled for 02-02-2014.

## 2013-12-14 NOTE — Progress Notes (Signed)
HPI :  Patient is a 58 year old female, new to this practice, referred for evaluation of anemia and hemoccult-positive stools. Patient has multiple medical problems, she is on multiple medications including Xarelto for atrial fibrillation. Her hemoglobin was 13.6 in August of last year, down to 10.8 in September and down to 7.8 now.  MCV is low at 73. She is s/p 2 units of blood 12/06/13, with a 2gm bump in hgb to 9.7. She was recently started on iron. Xarelto stopped about 3 weeks ago. No overt GI bleeding. She describes negative workup for anemia several years ago in [place]. She also saw hematology a couple of years ago for anemia, received a couple of iron infusions.  Past Medical History  Diagnosis Date  . Allergic rhinitis   . Asthma   . Hypertension   . Hypothyroidism   . Anemia, iron deficiency   . Vitamin D deficiency   . Thyroid cancer   . Morbid obesity   . GERD (gastroesophageal reflux disease)   . Hypoparathyroidism 01/07/2011  . Arthritis   . Fatty liver 01/07/2012  . Headache(784.0)     occasional sinus headache   . Atrial fibrillation August 2012  . PONV (postoperative nausea and vomiting)   . History of blood transfusion   . Fibroid 1974    fibroid cyst on left fallopian tube  . Abnormal Pap smear     years ago/no biopsy  . Nephrolithiasis 2012  . Bursitis of hip left  . Edema of both legs   . Seizures     infancy secondary to fever  . Fibromyalgia   . Enlarged liver   . Iron deficiency anemia   . Foot ulcer     Family History  Problem Relation Age of Onset  . Hypertension Father   . Diabetes Father   . Lung cancer Father   . Heart attack Father     MI at age 33  . Hypertension Mother   . Hyperthyroidism Mother   . Heart disease Mother   . Heart attack Mother     MI at age 77  . Asthma Brother   . Hypertension Brother     younger  . Heart disease Brother     older  . Colon cancer Neg Hx   . Esophageal cancer Neg Hx   . Stomach cancer Neg  Hx   . Kidney disease Neg Hx   . Liver disease Neg Hx    History  Substance Use Topics  . Smoking status: Former Smoker -- 10 years    Quit date: 05/27/1995  . Smokeless tobacco: Never Used  . Alcohol Use: Yes     Comment: 1/2 glass per month   Current Outpatient Prescriptions  Medication Sig Dispense Refill  . albuterol (PROVENTIL HFA;VENTOLIN HFA) 108 (90 BASE) MCG/ACT inhaler Inhale 2 puffs into the lungs every 6 (six) hours as needed for wheezing.  1 Inhaler  2  . albuterol (PROVENTIL) (2.5 MG/3ML) 0.083% nebulizer solution Take 3 mLs (2.5 mg total) by nebulization every 4 (four) hours as needed for wheezing.  75 mL  1  . Ascorbic Acid (VITAMIN C CR) 1000 MG TBCR Take 1 tablet by mouth 2 (two) times a week. On Tuesday and Thursday.      . Black Cohosh 40 MG CAPS Take 1 capsule by mouth daily.       . calcitRIOL (ROCALTROL) 0.5 MCG capsule Take 0.411 mcg by mouth 2 (two) times daily.       [name]  calcium carbonate (TUMS - DOSED IN MG ELEMENTAL CALCIUM) 500 MG chewable tablet Chew 1 tablet by mouth daily.       . cetirizine (ZYRTEC) 10 MG tablet Take 10 mg by mouth daily.      . clindamycin (CLEOCIN) 300 MG capsule Take 1 capsule (300 mg total) by mouth 3 (three) times daily.  30 capsule  0  . diltiazem (CARTIA XT) 240 MG 24 hr capsule Take 1 capsule (240 mg total) by mouth daily.  90 capsule  1  . ergocalciferol (VITAMIN D2) 50000 UNITS capsule Take 50,000 Units by mouth 2 (two) times a week. On Saturday and Wednesday      . estradiol (ESTRACE) 0.1 MG/GM vaginal cream Also apply externally two to three times weekly.  42.5 g  6  . fluticasone (FLONASE) 50 MCG/ACT nasal spray USE 2 SPRAYS IN EACH NOSTRIL EVERY DAY  16 g  3  . furosemide (LASIX) 40 MG tablet TAKE 1 TABLET BY MOUTH EVERY DAY  30 tablet  3  . hydrochlorothiazide (HYDRODIURIL) 25 MG tablet TAKE 1 TABLET BY MOUTH EVERY DAY  30 tablet  3  . iron polysaccharides (NU-IRON) 150 MG capsule Take 150 mg by mouth daily.       [name]  levothyroxine (SYNTHROID, LEVOTHROID) 137 MCG tablet Take 411 mcg by mouth every morning.       . methocarbamol (ROBAXIN) 500 MG tablet Take 500 mg by mouth 2 (two) times daily.       Earney Navy Bicarbonate (ZEGERID) 20-1100 MG CAPS Take 1 capsule by mouth daily before breakfast.       . POTASSIUM CITRATE PO Take 2-3 tablets daily      . SYMBICORT 160-4.5 MCG/ACT inhaler INHALE 2 PUFFS TWICE DAILY  10.2 g  1  . traMADol (ULTRAM) 50 MG tablet TAKE 1 TABLET BY MOUTH EVERY 6 HOURS AS NEEDED  30 tablet  0  . UNABLE TO FIND MANGANESE 60MG  TAKE ONE PILL WITH EACH MEAL THREE TIMES DAILY      . valsartan (DIOVAN) 80 MG tablet TAKE 1 TABLET BY MOUTH EVERY DAY  30 tablet  2  . vitamin B-12 (CYANOCOBALAMIN) 1000 MCG tablet Take 1,000 mcg by mouth 2 (two) times a week. On Tuesday and Thursday with Iron.      . Vitamin D, Ergocalciferol, (DRISDOL) 50000 UNITS CAPS capsule Take by mouth.      . vitamin E (VITAMIN E) 1000 UNIT capsule Take 1 two times a week.      Alveda Reasons 20 MG TABS tablet TAKE 1 TABLET BY MOUTH DAILY  30 tablet  3  . zafirlukast (ACCOLATE) 20 MG tablet TAKE 1 TABLET BY MOUTH TWICE DAILY  60 tablet  3   No current facility-administered medications for this visit.   Allergies  Allergen Reactions  . Other Anaphylaxis    NUTS  . Amoxicillin-Pot Clavulanate Other (See Comments)    headache  . Penicillins Other (See Comments)    headache    Review of Systems: Allergy/sinus trouble, arthritis, fatigue, night sweats, swelling of feet and legs . All other systems reviewed and negative except where noted in HPI.   Physical Exam: BP 160/70  Pulse 96  Ht 5\' 4"  (1.626 m)  Wt 309 lb (140.161 kg)  BMI 53.01 kg/m2  LMP 05/26/2009 Constitutional: Pleasant, morbidly obese white female in no acute distress. HEENT: Normocephalic and atraumatic. Conjunctivae are normal. No scleral icterus. Neck supple.  Cardiovascular: Normal rate, regular rhythm.  Pulmonary/chest: Effort  normal and  breath sounds normal. No wheezing, rales or rhonchi. Abdominal: Soft, morbidly obese, nontender. Bowel sounds active throughout. There are no masses palpable.  Extremities: 1 + bilateral lower extremity edema Lymphadenopathy: No cervical adenopathy noted. Neurological: Alert and oriented to person place and time. Skin: Skin is warm and dry. No rashes noted. Psychiatric: Normal mood and affect. Behavior is normal.   ASSESSMENT AND PLAN:  #34. 58 year old female with microcytic anemia, hemoccult-positive stool. Patient gives a history of anemia in 2006 with negative workup in [place]. It does not sound like she had a small bowel video capsule study. Patient mildly anemia at baseline but hemoglobin declined from 10.8 to 7.8 between Sept 2014 and July 2015. Patient started Xarelto about a year ago.   For further evaluation patient will be scheduled for EGD and colonoscopy to be done at [facility] (cannot be done at [facility] because of BMI). The risks, benefits, and alternatives to colonoscopy with possible biopsy and possible polypectomy were discussed with the patient and she consents to proceed.   Her Xarelto has been on hold for approximately 3 weeks. Will send note to cardiology to make sure they are okay with patient remaining off the medication while awaiting procedures.   #2. Loose stool, on clindamycin. It is common for her to have loose stool on antibiotics but still recommend stool studies if loose stool persists given Clindamycin's association with c-diff.  #3. multiple medical problems including, but not limited to, morbid obesity, hypertension, atrial fibrillation, hypothyroidism  #4. Remote gastric bypass (1970s)

## 2013-12-14 NOTE — Telephone Encounter (Signed)
Rx faxed to pharmacy  

## 2013-12-14 NOTE — Telephone Encounter (Signed)
We just want to confirm, it is okay for her to hold the Xarelto until her procedure on 02-02-2014, as she is currently no on any anticoagulation medication.

## 2013-12-15 ENCOUNTER — Telehealth: Payer: Self-pay | Admitting: *Deleted

## 2013-12-15 NOTE — Telephone Encounter (Signed)
Per Fredia Beets RN, she discussed with Dr. Stanford Breed, they are okay with her being off Xarelto if she is bleeding and her blood count is low.  The decision go restart will be up to primary care and GI evaluation.  Hope this helps. I let Tye Savoy NP read this from Dr. Stanford Breed and his nurse.

## 2013-12-15 NOTE — Progress Notes (Signed)
Complicated patient. Agree with initial assessment and plans. Based on history, I suspect that her chronic recurrent anemia his iron deficiency on the basis of prior gastric bypass surgery

## 2013-12-20 ENCOUNTER — Ambulatory Visit: Payer: BC Managed Care – PPO | Admitting: Family

## 2013-12-21 ENCOUNTER — Telehealth: Payer: Self-pay | Admitting: *Deleted

## 2013-12-21 ENCOUNTER — Encounter: Payer: Self-pay | Admitting: Podiatrist

## 2013-12-21 ENCOUNTER — Ambulatory Visit (INDEPENDENT_AMBULATORY_CARE_PROVIDER_SITE_OTHER): Payer: BC Managed Care – PPO | Admitting: Podiatrist

## 2013-12-21 VITALS — BP 117/66 | HR 85 | Resp 16

## 2013-12-21 DIAGNOSIS — L97509 Non-pressure chronic ulcer of other part of unspecified foot with unspecified severity: Secondary | ICD-10-CM

## 2013-12-21 MED ORDER — HYDROCODONE-ACETAMINOPHEN 5-325 MG PO TABS: 1.0000 | ORAL_TABLET | ORAL | Status: DC | PRN

## 2013-12-21 MED ORDER — CLINDAMYCIN HCL 300 MG PO CAPS: 300.0000 mg | ORAL_CAPSULE | Freq: Three times a day (TID) | ORAL | Status: DC

## 2013-12-21 NOTE — Telephone Encounter (Signed)
I informed the patient that Dr. Stanford Breed is okay with her being off the Xarelto since she was bleeding and her blood count was low.  He feels she can discuss with her PCP  if and when there is a time for her to restart the anticoagulant.

## 2013-12-21 NOTE — Patient Instructions (Signed)
Instructions for Wound Care  The most important step to healing a foot wound is to reduce the pressure on your foot - it is extremely important to stay off your foot as much as possible and wear the shoe/boot as instructed.  Cleanse your foot with saline wash or warm soapy water (dial antibacterial soap or similar).  Blot dry.  Apply prescribed medication to your wound and cover with gauze and a bandage.  May hold bandage in place with Coban (self sticky wrap), Ace bandage or tape.  You may find dressing supplies at your local [employer], [employer], drug store or medical supply store.  Your prescribed topical medication is :  Lodosorb Gel (once or twice daily depending on drainage)

## 2013-12-21 NOTE — Progress Notes (Signed)
   Subjective: Patient presents today for followup of tailors bunion with a new ulceration on the lateral aspect of the fifth metatarsal head. She states she's noticed that the fluid she is getting out of the ulcer is more clear now and before it was yellowish whitish discoloration. She still has pain on the lateral aspect of the right foot.  Objective: Neurovascular status intact and unchanged and ulceration measuring 4 mm in diameter is present on the lateral aspect of the fifth metatarsal head of the right foot. It is slit/fissure-like in appearance prior to debridement and there is clear fluid expressed. The area is anesthetized with lidocaine and Marcaine mixture and a thorough debridement is performed. No pus or purulence is encountered. And a red granular base is noted.  Assessment: Ulceration right foot fifth metatarsal head  Plan: Again anesthetized area and she was in pain with debridement did a thorough debridement and cleansed with saline. Applied Iodosorb and a dressing and gave instructions for home wound care. Dispensed a surgical shoe to keep pressure off of the lateral foot. I will see her back for recheck in 2 weeks.  Also put her on clindamycin 3 times daily. We will take x-rays at that visit as well.

## 2013-12-21 NOTE — Telephone Encounter (Signed)
I called and left a message for the patient that we heard back from Dr. Stanford Breed regarding the Xarelto medication and the colonoscopy she is scheduled for on 02-02-2014. Location is [facility].

## 2013-12-21 NOTE — Telephone Encounter (Signed)
Per Fredia Beets RN she discussed with Dr. Stanford Breed they are okay with her being off Xarelto if she is bleeding and her blood count is low. The decision to restart will be up to primary care and GI Evaluation. I let Tye Savoy NP read this from Dr. Stanford Breed and his nurse.

## 2013-12-22 ENCOUNTER — Telehealth: Payer: Self-pay | Admitting: Hematology & Oncology

## 2013-12-22 ENCOUNTER — Encounter: Payer: Self-pay | Admitting: Podiatrist

## 2013-12-22 NOTE — Telephone Encounter (Signed)
Left vm w NEW PATIENT today to remind them of their appointment with Dr. Ennever. Also, advised them to bring all medication bottles and insurance card information. ° °

## 2013-12-23 ENCOUNTER — Encounter: Payer: Self-pay | Admitting: Family

## 2013-12-23 ENCOUNTER — Ambulatory Visit (HOSPITAL_BASED_OUTPATIENT_CLINIC_OR_DEPARTMENT_OTHER): Payer: BC Managed Care – PPO

## 2013-12-23 ENCOUNTER — Ambulatory Visit (HOSPITAL_BASED_OUTPATIENT_CLINIC_OR_DEPARTMENT_OTHER): Payer: BC Managed Care – PPO | Admitting: Lab

## 2013-12-23 ENCOUNTER — Ambulatory Visit: Payer: BC Managed Care – PPO

## 2013-12-23 ENCOUNTER — Ambulatory Visit (HOSPITAL_BASED_OUTPATIENT_CLINIC_OR_DEPARTMENT_OTHER): Payer: BC Managed Care – PPO | Admitting: Family

## 2013-12-23 VITALS — BP 149/60 | HR 89 | Temp 97.9°F | Resp 16 | Ht 64.0 in | Wt 309.0 lb

## 2013-12-23 DIAGNOSIS — R5383 Other fatigue: Secondary | ICD-10-CM

## 2013-12-23 DIAGNOSIS — Z9884 Bariatric surgery status: Secondary | ICD-10-CM

## 2013-12-23 DIAGNOSIS — D509 Iron deficiency anemia, unspecified: Secondary | ICD-10-CM

## 2013-12-23 DIAGNOSIS — R5381 Other malaise: Secondary | ICD-10-CM

## 2013-12-23 DIAGNOSIS — D62 Acute posthemorrhagic anemia: Secondary | ICD-10-CM

## 2013-12-23 DIAGNOSIS — I4891 Unspecified atrial fibrillation: Secondary | ICD-10-CM

## 2013-12-23 LAB — CBC WITH DIFFERENTIAL (CANCER CENTER ONLY)
BASO#: 0.1 10*3/uL (ref 0.0–0.2)
BASO%: 0.7 % (ref 0.0–2.0)
EOS%: 2.9 % (ref 0.0–7.0)
Eosinophils Absolute: 0.2 10*3/uL (ref 0.0–0.5)
HEMATOCRIT: 34.1 % — AB (ref 34.8–46.6)
HEMOGLOBIN: 10.1 g/dL — AB (ref 11.6–15.9)
LYMPH#: 1.3 10*3/uL (ref 0.9–3.3)
LYMPH%: 17.2 % (ref 14.0–48.0)
MCH: 22.2 pg — ABNORMAL LOW (ref 26.0–34.0)
MCHC: 29.6 g/dL — ABNORMAL LOW (ref 32.0–36.0)
MCV: 75 fL — ABNORMAL LOW (ref 81–101)
MONO#: 0.5 10*3/uL (ref 0.1–0.9)
MONO%: 7 % (ref 0.0–13.0)
NEUT#: 5.3 10*3/uL (ref 1.5–6.5)
NEUT%: 72.2 % (ref 39.6–80.0)
Platelets: 309 10*3/uL (ref 145–400)
RBC: 4.54 10*6/uL (ref 3.70–5.32)
RDW: 19.6 % — AB (ref 11.1–15.7)
WBC: 7.3 10*3/uL (ref 3.9–10.0)

## 2013-12-23 LAB — IRON AND TIBC CHCC
%SAT: 7 % — AB (ref 21–57)
Iron: 23 ug/dL — ABNORMAL LOW (ref 41–142)
TIBC: 306 ug/dL (ref 236–444)
UIBC: 283 ug/dL (ref 120–384)

## 2013-12-23 LAB — FERRITIN CHCC: FERRITIN: 243 ng/mL (ref 9–269)

## 2013-12-23 LAB — CHCC SATELLITE - SMEAR

## 2013-12-23 LAB — RETICULOCYTES (CHCC)
ABS Retic: 68.9 10*3/uL (ref 19.0–186.0)
RBC.: 4.59 MIL/uL (ref 3.87–5.11)
RETIC CT PCT: 1.5 % (ref 0.4–2.3)

## 2013-12-23 LAB — TECHNOLOGIST REVIEW CHCC SATELLITE

## 2013-12-23 MED ORDER — SODIUM CHLORIDE 0.9 % IV SOLN
1020.0000 mg | Freq: Once | INTRAVENOUS | Status: AC
  Administered 2013-12-23: 1020 mg via INTRAVENOUS
  Filled 2013-12-23: qty 34

## 2013-12-23 MED ORDER — SODIUM CHLORIDE 0.9 % IV SOLN
Freq: Once | INTRAVENOUS | Status: AC
  Administered 2013-12-23: 13:00:00 via INTRAVENOUS

## 2013-12-23 NOTE — Progress Notes (Addendum)
[facility]  Telephone:([phone]) [phone] [phone]) [phone]  ID: [name] OB: 07-03-1955 MR#: [id number] Patient Care Team: Debbrah Alar, NP as PCP - General (Internal Medicine) Deneise Lever, MD (Pulmonary Disease) Lelon Perla, MD (Cardiology) Delrae Rend, MD (Internal Medicine)  DIAGNOSIS: iron deficiency anemia  INTERVAL HISTORY: [name] is a very pleasant 58 yo female with iron deficiency anemia. She was seen several years ago by Dr. Marin Olp and received iron infusions at that time. She has atrial fib which was being treated with Xarelto. In early January she noticed blood in her stool and was very tied. Her Hgb at that time was 6.7 and she received 2 units PRBCs. Her Hgb then came up to 9.7.  Today it is 10.1.  She is on oral iron and B12. Her cardiologist said it would be ok to stop her Xarelto for a short time so she could have an upper and lower endoscopy. She is scheduled to have these procedure on September 9th. She has an open wound on her right foot that was recently debrided and she is on prophylactic antibiotics for this. The antibiotics are giving her diarrhea. She did have gastric bypass in 1074. She also has uterine cancer. She has an IUD in right now and she may have a hysterectomy in October. Her oncologist is still considering this. Her appetite is good.   CURRENT TREATMENT: IV iron infusions as needed  REVIEW OF SYSTEMS: All other 10 point review of systems is negative except for those issues mentioned above.   PAST MEDICAL HISTORY: Past Medical History  Diagnosis Date  . Allergic rhinitis   . Asthma   . Hypertension   . Hypothyroidism   . Anemia, iron deficiency   . Vitamin D deficiency   . Thyroid cancer   . Morbid obesity   . GERD (gastroesophageal reflux disease)   . Hypoparathyroidism 01/07/2011  . Arthritis   . Fatty liver 01/07/2012  . Headache(784.0)     occasional sinus headache   . Atrial fibrillation  August 2012  . PONV (postoperative nausea and vomiting)   . History of blood transfusion   . Fibroid 1974    fibroid cyst on left fallopian tube  . Abnormal Pap smear     years ago/no biopsy  . Nephrolithiasis 2012  . Bursitis of hip left  . Edema of both legs   . Seizures     infancy secondary to fever  . Fibromyalgia   . Enlarged liver   . Iron deficiency anemia   . Foot ulcer    PAST SURGICAL HISTORY: Past Surgical History  Procedure Laterality Date  . Tonsillectomy and adenoidectomy    . Bunionectomy      bilateral  . Appendectomy    . Gastric bypass  1974  . Thyroidectomy  05/15/2010  . Cyst on ovary removed     . Ureteroscopy  03/03/2012    Procedure: URETEROSCOPY;  Surgeon: Bernestine Amass, MD;  Location: [facility];  Service: Urology;  Laterality: Left;  . Cystoscopy w/ retrogrades  03/03/2012    Procedure: CYSTOSCOPY WITH RETROGRADE PYELOGRAM;  Surgeon: Bernestine Amass, MD;  Location: [facility];  Service: Urology;  Laterality: Bilateral;  . Lithotripsy  03/2012  . Cystoscopy with retrograde pyelogram, ureteroscopy and stent placement Left 08/25/2012    Procedure: CYSTOSCOPY WITH RETROGRADE PYELOGRAM, URETEROSCOPY;  Surgeon: Bernestine Amass, MD;  Location: [facility];  Service: Urology;  Laterality: Left;  . Holmium laser application Left  08/25/2012    Procedure: HOLMIUM LASER APPLICATION;  Surgeon: Bernestine Amass, MD;  Location: [facility];  Service: Urology;  Laterality: Left;  . Dilation and curettage of uterus N/A 02/21/2013    Procedure: DILATATION AND CURETTAGE;  Surgeon: Lyman Speller, MD;  Location: [facility];  Service: Gynecology;  Laterality: N/A;   FAMILY HISTORY Family History  Problem Relation Age of Onset  . Hypertension Father   . Diabetes Father   . Lung cancer Father   . Heart attack Father     MI at age 66  . Hypertension Mother   . Hyperthyroidism Mother   . Heart disease Mother   . Heart attack Mother     MI at age 67  . Asthma Brother   . Hypertension  Brother     younger  . Heart disease Brother     older  . Colon cancer Neg Hx   . Esophageal cancer Neg Hx   . Stomach cancer Neg Hx   . Kidney disease Neg Hx   . Liver disease Neg Hx    GYNECOLOGIC HISTORY:  Patient's last menstrual period was 05/26/2009.   SOCIAL HISTORY:  History   Social History  . Marital Status: Single    Spouse Name: N/A    Number of Children: 0  . Years of Education: N/A   Occupational History  . works in Insurance claims handler   . [employer]    Social History Main Topics  . Smoking status: Former Smoker -- 0.50 packs/day for 25 years    Types: Cigarettes    Start date: 12/24/1970    Quit date: 05/27/1995  . Smokeless tobacco: Never Used     Comment: quit smoking 17 years ago  . Alcohol Use: Yes     Comment: 1/2 glass per month  . Drug Use: No  . Sexual Activity: Yes    Partners: Male   Other Topics Concern  . Not on file   Social History Narrative   Occupation: works in Insurance claims handler - Field seismologist   Single       Former Smoker - quit tobacco 12 years ago.  She was light smoker for 10 years.                ADVANCED DIRECTIVES: <no information>  HEALTH MAINTENANCE: History  Substance Use Topics  . Smoking status: Former Smoker -- 0.50 packs/day for 25 years    Types: Cigarettes    Start date: 12/24/1970    Quit date: 05/27/1995  . Smokeless tobacco: Never Used     Comment: quit smoking 17 years ago  . Alcohol Use: Yes     Comment: 1/2 glass per month   Colonoscopy: PAP: Bone density: Lipid panel:  Allergies  Allergen Reactions  . Other Anaphylaxis    NUTS  . Amoxicillin-Pot Clavulanate Other (See Comments)    headache  . Penicillins Other (See Comments)    headache    Current Outpatient Prescriptions  Medication Sig Dispense Refill  . albuterol (PROVENTIL HFA;VENTOLIN HFA) 108 (90 BASE) MCG/ACT inhaler Inhale 2 puffs into the lungs every 6 (six) hours as needed for wheezing.  1 Inhaler  2  .  albuterol (PROVENTIL) (2.5 MG/3ML) 0.083% nebulizer solution Take 3 mLs (2.5 mg total) by nebulization every 4 (four) hours as needed for wheezing.  75 mL  1  . Ascorbic Acid (VITAMIN C CR) 1000 MG TBCR Take 1 tablet by mouth 2 (two) times a week. On  Tuesday and Thursday.      . Black Cohosh 40 MG CAPS Take 1 capsule by mouth daily.       . calcitRIOL (ROCALTROL) 0.5 MCG capsule Take 0.411 mcg by mouth 2 (two) times daily.       . calcium carbonate (TUMS - DOSED IN MG ELEMENTAL CALCIUM) 500 MG chewable tablet Chew 1 tablet by mouth daily.       . cetirizine (ZYRTEC) 10 MG tablet Take 10 mg by mouth daily.      [name] diltiazem (CARTIA XT) 240 MG 24 hr capsule Take 1 capsule (240 mg total) by mouth daily.  90 capsule  1  . ergocalciferol (VITAMIN D2) 50000 UNITS capsule Take 50,000 Units by mouth 2 (two) times a week. On Saturday and Wednesday      . estradiol (ESTRACE) 0.1 MG/GM vaginal cream Also apply externally two to three times weekly.  42.5 g  6  . fluticasone (FLONASE) 50 MCG/ACT nasal spray USE 2 SPRAYS IN EACH NOSTRIL EVERY DAY  16 g  3  . furosemide (LASIX) 40 MG tablet TAKE 1 TABLET BY MOUTH EVERY DAY  30 tablet  3  . hydrochlorothiazide (HYDRODIURIL) 25 MG tablet TAKE 1 TABLET BY MOUTH EVERY DAY  30 tablet  3  . HYDROcodone-acetaminophen (NORCO/VICODIN) 5-325 MG per tablet Take 1 tablet by mouth every 4 (four) hours as needed.  40 tablet  0  . iron polysaccharides (NU-IRON) 150 MG capsule Take 150 mg by mouth daily.       . methocarbamol (ROBAXIN) 500 MG tablet Take 500 mg by mouth 2 (two) times daily.       [name] nystatin cream (MYCOSTATIN) Apply 1 application topically as needed.       Earney Navy Bicarbonate (ZEGERID) 20-1100 MG CAPS Take 1 capsule by mouth daily before breakfast.       . POTASSIUM CITRATE PO Take 2-3 tablets daily      . SYMBICORT 160-4.5 MCG/ACT inhaler INHALE 2 PUFFS TWICE DAILY  10.2 g  1  . traMADol (ULTRAM) 50 MG tablet TAKE 1 TABLET BY MOUTH EVERY 6 HOURS AS  NEEDED  30 tablet  0  . valsartan (DIOVAN) 80 MG tablet TAKE 1 TABLET BY MOUTH EVERY DAY  30 tablet  2  . vitamin B-12 (CYANOCOBALAMIN) 1000 MCG tablet Take 1,000 mcg by mouth 2 (two) times a week. On Tuesday and Thursday with Iron.      . Vitamin D, Ergocalciferol, (DRISDOL) 50000 UNITS CAPS capsule Take by mouth.      . vitamin E (VITAMIN E) 1000 UNIT capsule Take 1 two times a week.      . zafirlukast (ACCOLATE) 20 MG tablet TAKE 1 TABLET BY MOUTH TWICE DAILY  60 tablet  3  . levothyroxine (SYNTHROID, LEVOTHROID) 137 MCG tablet Take 411 mcg by mouth daily. Takes 3 tabs in AM      . MOVIPREP 100 G SOLR Take 1 kit (200 g total) by mouth as directed.  1 kit  0  . UNABLE TO FIND MANGANESE 60MG TAKE ONE PILL WITH EACH MEAL THREE TIMES DAILY       Current Facility-Administered Medications  Medication Dose Route Frequency Provider Last Rate Last Dose  . ferumoxytol (FERAHEME) 1,020 mg in sodium chloride 0.9 % 100 mL IVPB  1,020 mg Intravenous Once Eliezer Bottom, NP       OBJECTIVE: Filed Vitals:   12/23/13 1054  BP: 149/60  Pulse: 89  Temp: 97.9 F (36.6  C)  Resp: 16   Body mass index is 53.01 kg/(m^2). ECOG FS:1 - Symptomatic but completely ambulatory Ocular: Sclerae unicteric, pupils equal, round and reactive to light Ear-nose-throat: Oropharynx clear, dentition fair Lymphatic: No cervical or supraclavicular adenopathy Lungs no rales or rhonchi, good excursion bilaterally Heart regular rate and rhythm, no murmur appreciated Abd soft, nontender, positive bowel sounds MSK no focal spinal tenderness, no joint edema Neuro: non-focal, well-oriented, appropriate affect Breasts: Deferred  LAB RESULTS: CMP     Component Value Date/Time   NA 141 11/23/2013 0859   K 3.7 11/23/2013 0859   CL 102 11/23/2013 0859   CO2 28 11/23/2013 0859   GLUCOSE 101* 11/23/2013 0859   BUN 18 11/23/2013 0859   CREATININE 0.80 11/23/2013 0859   CREATININE 0.69 02/16/2013 1420   CALCIUM 8.2* 11/23/2013 0859    CALCIUM 8.0* 05/27/2010 1855   PROT 7.2 06/12/2010 1356   ALBUMIN 4.1 06/12/2010 1356   AST 23 06/12/2010 1356   ALT 27 06/12/2010 1356   ALKPHOS 98 06/12/2010 1356   BILITOT 0.3 06/12/2010 1356   GFRNONAA 83 11/23/2013 0859   GFRNONAA >90 02/16/2013 1420   GFRAA >89 11/23/2013 0859   GFRAA >90 02/16/2013 1420   No results found for this basename: SPEP, UPEP,  kappa and lambda light chains   Lab Results  Component Value Date   WBC 7.3 12/23/2013   NEUTROABS 5.3 12/23/2013   HGB 10.1* 12/23/2013   HCT 34.1* 12/23/2013   MCV 75* 12/23/2013   PLT 309 12/23/2013   No results found for this basename: LABCA2   No components found with this basename: [id number]   No results found for this basename: INR,  in the last 168 hours  STUDIES: No results found.  ASSESSMENT/PLAN: [name] is a very pleasant 58 yo female with iron deficiency anemia. She had a gastric bypass in 1974. She has been taking oral iron but it is not helping. Her bleeding seems to have resolved, she is holding steady with a Hgb of 10.1  She is very tired.  We will give her iron today.  She is scheduled for her upper and lower scopes in September but we will speak with Dr. Henrene Pastor and see if we can get her procedures done sooner. With her atrial fib it would be good to get her in ASAP. We will see her back in 1 month for labs and a follow up.  All questions were answered and she is in agreement with the plan. She knows to call here with any questions or concerns and to go to the ED in the event of an emergency. We can certainly see her sooner if need be.   Eliezer Bottom, NP 12/23/2013 12:11 PM  ADDENDUM:  I saw and examined the patient. We have not seen her for a few years. She has iron deficiency again. She has had a gastric bypass.  I looked at  her blood smear. She had hypochromic and microcytic red cells. I do not see any immature cells. She had normal white blood cells. There were no hypersegmented polys. There were no immature  myeloid or lymphoid forms. Platelets are  adequate in number and size. Platelets are well granulated.  We will go ahead and give her iron today. I don't think she will absorb iron all that well.  She feels tired. She has had some lower GI bleeding. She has seen gastroenterology. For some reason they won't schedule her for an endoscopy until September. I  cannot clear this out. She was on Xarelto for paroxysmal atrial fibrillation. She is off this now.  I think she really needs to have a endoscopy to see of there is any source of bleeding. If negative, she may need a capsule endoscopy.  We will plan to get her back to see her in another month or so.  It was good to see her again. It has been a while since we saw her.

## 2013-12-23 NOTE — Addendum Note (Signed)
Addended by: Burney Gauze R on: 12/23/2013 05:37 PM   Modules accepted: Level of Service

## 2013-12-28 ENCOUNTER — Telehealth: Payer: Self-pay | Admitting: *Deleted

## 2013-12-28 NOTE — Telephone Encounter (Signed)
Pt dropped off application for disability parking placard to be completed and mailed to her home address. Application completed and mailed. Copy sent for scanning.

## 2013-12-30 ENCOUNTER — Other Ambulatory Visit: Payer: Self-pay | Admitting: Family

## 2014-01-03 ENCOUNTER — Encounter: Payer: Self-pay | Admitting: Internal Medicine

## 2014-01-04 ENCOUNTER — Other Ambulatory Visit: Payer: Self-pay | Admitting: Family

## 2014-01-06 ENCOUNTER — Encounter: Payer: Self-pay | Admitting: Podiatrist

## 2014-01-06 ENCOUNTER — Ambulatory Visit (INDEPENDENT_AMBULATORY_CARE_PROVIDER_SITE_OTHER): Payer: BC Managed Care – PPO | Admitting: Podiatrist

## 2014-01-06 ENCOUNTER — Ambulatory Visit (INDEPENDENT_AMBULATORY_CARE_PROVIDER_SITE_OTHER): Payer: BC Managed Care – PPO

## 2014-01-06 VITALS — BP 145/78 | HR 95 | Resp 16

## 2014-01-06 DIAGNOSIS — L97509 Non-pressure chronic ulcer of other part of unspecified foot with unspecified severity: Secondary | ICD-10-CM

## 2014-01-06 DIAGNOSIS — L97511 Non-pressure chronic ulcer of other part of right foot limited to breakdown of skin: Secondary | ICD-10-CM

## 2014-01-06 DIAGNOSIS — M869 Osteomyelitis, unspecified: Secondary | ICD-10-CM

## 2014-01-06 MED ORDER — HYDROCODONE-ACETAMINOPHEN 5-325 MG PO TABS: 1.0000 | ORAL_TABLET | ORAL | Status: DC | PRN

## 2014-01-06 NOTE — Progress Notes (Signed)
Subjective: Patient presents today for followup of ulceration fifth metatarsal head right foot. She states it callused over and is no longer draining. Overall she's been taking antibiotics and wearing the postoperative shoe as instructed. She is feeling well with no sign of systemic or local infection present.  Objective: Significant C-shaped foot is seen. Prominent fifth metatarsal head is noted. Overlying ulcer has healed nicely. Hyperkeratotic tissue is overlying once debrided reveals intact integument. No drainage, no malodor, no pus or purulence is seen. X-rays show a very prominent fifth metatarsal head with a exostosis present and in the area of the ulceration. In comparison with the previous films the fifth metatarsal head itself appears sclerotic   Assessment: Ulceration fifth metatarsal head right with bony exostosis and risk for bone infection due to the proximity of the skin infection to the  Plan: Debrided the callus tissue and no underlying ulceration is present today's visit. Recommended continued offloading with gauze and dressings and continued use of the surgical shoe. Discussed in the future we may need to remove some of the bone of the fifth metatarsal head to prevent a rubbing and she her against her shoes. I will see her back for recheck and will discuss further at that time.

## 2014-01-18 ENCOUNTER — Encounter (HOSPITAL_COMMUNITY): Payer: Self-pay | Admitting: Pharmacy Technician

## 2014-01-18 ENCOUNTER — Other Ambulatory Visit: Payer: Self-pay | Admitting: Family

## 2014-01-18 NOTE — Telephone Encounter (Signed)
eScribe request from [facility] for refill on Tramadol Last filled - 07.22.15, #30x0 [P/U 07.24.15 per pharmacy] Last AEX - 07.10.15 [F/U Anemia] Next AEX - No future appts scheduled [Canceled 6-Mth F/U 07.22.15] Please Advise on refills/SLS

## 2014-01-19 NOTE — Telephone Encounter (Signed)
Ok to send 30 tabs zero refills. 

## 2014-01-20 ENCOUNTER — Ambulatory Visit (HOSPITAL_BASED_OUTPATIENT_CLINIC_OR_DEPARTMENT_OTHER): Payer: BC Managed Care – PPO | Admitting: Family

## 2014-01-20 ENCOUNTER — Encounter: Payer: Self-pay | Admitting: Family

## 2014-01-20 ENCOUNTER — Other Ambulatory Visit (HOSPITAL_BASED_OUTPATIENT_CLINIC_OR_DEPARTMENT_OTHER): Payer: BC Managed Care – PPO | Admitting: Lab

## 2014-01-20 ENCOUNTER — Encounter: Payer: Self-pay | Admitting: *Deleted

## 2014-01-20 VITALS — BP 119/50 | HR 80 | Temp 98.1°F | Resp 16 | Ht 64.0 in | Wt 309.0 lb

## 2014-01-20 DIAGNOSIS — C549 Malignant neoplasm of corpus uteri, unspecified: Secondary | ICD-10-CM

## 2014-01-20 DIAGNOSIS — D509 Iron deficiency anemia, unspecified: Secondary | ICD-10-CM

## 2014-01-20 DIAGNOSIS — Z9884 Bariatric surgery status: Secondary | ICD-10-CM

## 2014-01-20 LAB — RETICULOCYTES (CHCC)
ABS Retic: 74.7 10*3/uL (ref 19.0–186.0)
RBC.: 4.98 MIL/uL (ref 3.87–5.11)
Retic Ct Pct: 1.5 % (ref 0.4–2.3)

## 2014-01-20 LAB — CBC WITH DIFFERENTIAL (CANCER CENTER ONLY)
BASO#: 0.1 10*3/uL (ref 0.0–0.2)
BASO%: 0.5 % (ref 0.0–2.0)
EOS ABS: 0.2 10*3/uL (ref 0.0–0.5)
EOS%: 1.6 % (ref 0.0–7.0)
HCT: 38.5 % (ref 34.8–46.6)
HGB: 11.9 g/dL (ref 11.6–15.9)
LYMPH#: 1.6 10*3/uL (ref 0.9–3.3)
LYMPH%: 14.7 % (ref 14.0–48.0)
MCH: 24.2 pg — AB (ref 26.0–34.0)
MCHC: 30.9 g/dL — ABNORMAL LOW (ref 32.0–36.0)
MCV: 78 fL — ABNORMAL LOW (ref 81–101)
MONO#: 0.6 10*3/uL (ref 0.1–0.9)
MONO%: 5.4 % (ref 0.0–13.0)
NEUT#: 8.3 10*3/uL — ABNORMAL HIGH (ref 1.5–6.5)
NEUT%: 77.8 % (ref 39.6–80.0)
PLATELETS: ADEQUATE 10*3/uL (ref 145–400)
RBC: 4.92 10*6/uL (ref 3.70–5.32)
RDW: 21.8 % — AB (ref 11.1–15.7)
WBC: 10.7 10*3/uL — ABNORMAL HIGH (ref 3.9–10.0)

## 2014-01-20 LAB — IRON AND TIBC CHCC
%SAT: 12 % — ABNORMAL LOW (ref 21–57)
IRON: 35 ug/dL — AB (ref 41–142)
TIBC: 286 ug/dL (ref 236–444)
UIBC: 252 ug/dL (ref 120–384)

## 2014-01-20 LAB — FERRITIN CHCC: Ferritin: 489 ng/ml — ABNORMAL HIGH (ref 9–269)

## 2014-01-20 LAB — CHCC SATELLITE - SMEAR

## 2014-01-20 NOTE — Progress Notes (Signed)
[place] Work  Clinical Social Work was referred by patient for assessment of psychosocial needs due to transportation concerns for her upcoming procedure.  Clinical Social Worker problem solved with pt over the phone and provided several resources to assist. CSW discussed Road to Recovery through [facility], [employer] and possibly using family, friends and church members. CSW made pt aware that most services require pt to be able to get in and out of the car independently. This may be challenging after procedure. Pt was most interested in [employer], which is a paid service, but provides a higher level of assistance. Pt appreciative of resources, but stated she may still have to cancel her appointment.   Clinical Social Work interventions: Resource education Supportive listening  Loren Racer, LCSW Clinical Social Worker [facility] for [place] Wednesday, Thursday and Friday Phone: [phone] Fax: [phone]

## 2014-01-20 NOTE — Telephone Encounter (Signed)
Rx called to pharmacy voicemail. 

## 2014-01-20 NOTE — Progress Notes (Signed)
[place]  Telephone:([phone]) [phone] Fax:(336) [phone]  ID: [name] OB: 07/01/1955 MR#: [id number]#:[id number] Patient Care Team: Debbrah Alar, NP as PCP - General (Internal Medicine) Deneise Lever, MD (Pulmonary Disease) Lelon Perla, MD (Cardiology) Delrae Rend, MD (Internal Medicine)  DIAGNOSIS: iron deficiency anemia  INTERVAL HISTORY: [name] is a very pleasant 58 yo female with iron deficiency anemia. She is here today for a follow-up appointment. She is doing much better since her iron infusion. Her energy is much improved. She has had a couple of stools with some bright red blood earlier this month but it stopped and she has not had it happen since. She is scheduled to have an upper and lower scope on September 10th. The wound on her right foot is a little better but she has to have it worked on again in the next few months. The antibiotics are giving her diarrhea. We talked about the BRAT diet and she is going to try this. She did have gastric bypass in 1074. She also has uterine cancer. She has an IUD in right now and she may have a hysterectomy in October. Her oncologist is still considering this. Her appetite is good and she is drinking plenty of fluids..   CURRENT TREATMENT: IV iron infusions as needed  REVIEW OF SYSTEMS: All other 10 point review of systems is negative except for those issues mentioned above.   PAST MEDICAL HISTORY: Past Medical History  Diagnosis Date  . Allergic rhinitis   . Asthma   . Hypertension   . Hypothyroidism   . Anemia, iron deficiency   . Vitamin D deficiency   . Thyroid cancer   . Morbid obesity   . GERD (gastroesophageal reflux disease)   . Hypoparathyroidism 01/07/2011  . Arthritis   . Fatty liver 01/07/2012  . Headache(784.0)     occasional sinus headache   . Atrial fibrillation August 2012  . PONV (postoperative nausea and vomiting)   . History of blood transfusion   . Fibroid 1974    fibroid  cyst on left fallopian tube  . Abnormal Pap smear     years ago/no biopsy  . Nephrolithiasis 2012  . Bursitis of hip left  . Edema of both legs   . Seizures     infancy secondary to fever  . Fibromyalgia   . Enlarged liver   . Iron deficiency anemia   . Foot ulcer    PAST SURGICAL HISTORY: Past Surgical History  Procedure Laterality Date  . Tonsillectomy and adenoidectomy    . Bunionectomy      bilateral  . Appendectomy    . Gastric bypass  1974  . Thyroidectomy  05/15/2010  . Cyst on ovary removed     . Ureteroscopy  03/03/2012    Procedure: URETEROSCOPY;  Surgeon: Bernestine Amass, MD;  Location: [facility];  Service: Urology;  Laterality: Left;  . Cystoscopy w/ retrogrades  03/03/2012    Procedure: CYSTOSCOPY WITH RETROGRADE PYELOGRAM;  Surgeon: Bernestine Amass, MD;  Location: [facility];  Service: Urology;  Laterality: Bilateral;  . Lithotripsy  03/2012  . Cystoscopy with retrograde pyelogram, ureteroscopy and stent placement Left 08/25/2012    Procedure: CYSTOSCOPY WITH RETROGRADE PYELOGRAM, URETEROSCOPY;  Surgeon: Bernestine Amass, MD;  Location: [facility];  Service: Urology;  Laterality: Left;  . Holmium laser application Left 0/06/7251    Procedure: HOLMIUM LASER APPLICATION;  Surgeon: Bernestine Amass, MD;  Location: [facility];  Service: Urology;  Laterality: Left;  . Dilation and curettage of uterus N/A 02/21/2013    Procedure: DILATATION AND CURETTAGE;  Surgeon: Lyman Speller, MD;  Location: [place] [facility];  Service: Gynecology;  Laterality: N/A;   FAMILY HISTORY Family History  Problem Relation Age of Onset  . Hypertension Father   . Diabetes Father   . Lung cancer Father   . Heart attack Father     MI at age 59  . Hypertension Mother   . Hyperthyroidism Mother   . Heart disease Mother   . Heart attack Mother     MI at age 39  . Asthma Brother   . Hypertension Brother     younger  . Heart disease Brother     older  . Colon cancer Neg Hx   . Esophageal cancer Neg Hx   . Stomach  cancer Neg Hx   . Kidney disease Neg Hx   . Liver disease Neg Hx    GYNECOLOGIC HISTORY:  Patient's last menstrual period was 05/26/2009.   SOCIAL HISTORY:  History   Social History  . Marital Status: Single    Spouse Name: N/A    Number of Children: 0  . Years of Education: N/A   Occupational History  . works in Insurance claims handler   . [employer]    Social History Main Topics  . Smoking status: Former Smoker -- 0.50 packs/day for 25 years    Types: Cigarettes    Start date: 12/24/1970    Quit date: 05/27/1995  . Smokeless tobacco: Never Used     Comment: quit smoking 17 years ago  . Alcohol Use: Yes     Comment: 1/2 glass per month  . Drug Use: No  . Sexual Activity: Yes    Partners: Male   Other Topics Concern  . Not on file   Social History Narrative   Occupation: works in Insurance claims handler - Field seismologist   Single       Former Smoker - quit tobacco 12 years ago.  She was light smoker for 10 years.                ADVANCED DIRECTIVES: <no information>  HEALTH MAINTENANCE: History  Substance Use Topics  . Smoking status: Former Smoker -- 0.50 packs/day for 25 years    Types: Cigarettes    Start date: 12/24/1970    Quit date: 05/27/1995  . Smokeless tobacco: Never Used     Comment: quit smoking 17 years ago  . Alcohol Use: Yes     Comment: 1/2 glass per month   Colonoscopy: PAP: Bone density: Lipid panel:  Allergies  Allergen Reactions  . Other Anaphylaxis    NUTS  . Amoxicillin-Pot Clavulanate Other (See Comments)    headache  . Penicillins Other (See Comments)    headache    Current Outpatient Prescriptions  Medication Sig Dispense Refill  . albuterol (PROVENTIL) (2.5 MG/3ML) 0.083% nebulizer solution Take 2.5 mg by nebulization every 6 (six) hours as needed for wheezing or shortness of breath.      . Ascorbic Acid (VITAMIN C CR) 1000 MG TBCR Take 1 tablet by mouth 2 (two) times a week. On Tuesday and Thursday.      .  Black Cohosh 40 MG CAPS Take 1 capsule by mouth daily.       . budesonide-formoterol (SYMBICORT) 160-4.5 MCG/ACT inhaler Inhale 2 puffs into the lungs 2 (two) times daily.      . calcitRIOL (ROCALTROL) 0.5 MCG  capsule Take 0.411 mcg by mouth 2 (two) times daily.       . calcium carbonate (TUMS - DOSED IN MG ELEMENTAL CALCIUM) 500 MG chewable tablet Chew 1 tablet by mouth daily.       . cetirizine (ZYRTEC) 10 MG tablet Take 10 mg by mouth daily.      [name] diltiazem (CARDIZEM CD) 240 MG 24 hr capsule Take 240 mg by mouth daily.      . ergocalciferol (VITAMIN D2) 50000 UNITS capsule Take 50,000 Units by mouth 2 (two) times a week. On Saturday and Wednesday      . fluticasone (FLONASE) 50 MCG/ACT nasal spray Place 2 sprays into both nostrils daily.      . furosemide (LASIX) 40 MG tablet Take 40 mg by mouth daily.      . hydrochlorothiazide (HYDRODIURIL) 25 MG tablet Take 25 mg by mouth daily.      . iron polysaccharides (NU-IRON) 150 MG capsule Take 150 mg by mouth daily.       [name] levothyroxine (SYNTHROID, LEVOTHROID) 137 MCG tablet Take 411 mcg by mouth daily. Takes 3 tabs in AM      . methocarbamol (ROBAXIN) 500 MG tablet Take 500 mg by mouth 2 (two) times daily.       [name] nystatin cream (MYCOSTATIN) Apply 1 application topically as needed.       Earney Navy Bicarbonate (ZEGERID) 20-1100 MG CAPS Take 1 capsule by mouth daily before breakfast.       . Potassium Citrate 15 MEQ (1620 MG) TBCR Take by mouth 2 (two) times daily.      . traMADol (ULTRAM) 50 MG tablet TAKE 1 TABLET BY MOUTH EVERY 6 HOURS AS NEEDED  30 tablet  0  . UNABLE TO FIND MANGANESE 60MG  TAKE ONE PILL WITH EACH MEAL THREE TIMES DAILY      . valsartan (DIOVAN) 80 MG tablet Take 80 mg by mouth daily.      . vitamin B-12 (CYANOCOBALAMIN) 1000 MCG tablet Take 1,000 mcg by mouth 2 (two) times a week. On Tuesday and Thursday with Iron.      . Vitamin D, Ergocalciferol, (DRISDOL) 50000 UNITS CAPS capsule Take by mouth.      . vitamin E  (VITAMIN E) 1000 UNIT capsule Take 1 two times a week.      . zafirlukast (ACCOLATE) 20 MG tablet Take 20 mg by mouth 2 (two) times daily before a meal.       No current facility-administered medications for this visit.   OBJECTIVE: Filed Vitals:   01/20/14 1040  BP: 119/50  Pulse: 80  Temp: 98.1 F (36.7 C)  Resp: 16   Body mass index is 53.01 kg/(m^2). ECOG FS:0 - Asymptomatic Ocular: Sclerae unicteric, pupils equal, round and reactive to light Ear-nose-throat: Oropharynx clear, dentition fair Lymphatic: No cervical or supraclavicular adenopathy Lungs no rales or rhonchi, good excursion bilaterally Heart regular rate and rhythm, no murmur appreciated Abd soft, nontender, positive bowel sounds MSK no focal spinal tenderness, no joint edema Neuro: non-focal, well-oriented, appropriate affect Breasts: Deferred  LAB RESULTS: CMP     Component Value Date/Time   NA 141 11/23/2013 0859   K 3.7 11/23/2013 0859   CL 102 11/23/2013 0859   CO2 28 11/23/2013 0859   GLUCOSE 101* 11/23/2013 0859   BUN 18 11/23/2013 0859   CREATININE 0.80 11/23/2013 0859   CREATININE 0.69 02/16/2013 1420   CALCIUM 8.2* 11/23/2013 0859   CALCIUM 8.0* 05/27/2010 1855  PROT 7.2 06/12/2010 1356   ALBUMIN 4.1 06/12/2010 1356   AST 23 06/12/2010 1356   ALT 27 06/12/2010 1356   ALKPHOS 98 06/12/2010 1356   BILITOT 0.3 06/12/2010 1356   GFRNONAA 83 11/23/2013 0859   GFRNONAA >90 02/16/2013 1420   GFRAA >89 11/23/2013 0859   GFRAA >90 02/16/2013 1420   No results found for this basename: SPEP,  UPEP,   kappa and lambda light chains   Lab Results  Component Value Date   WBC 10.7* 01/20/2014   NEUTROABS 8.3* 01/20/2014   HGB 11.9 01/20/2014   HCT 38.5 01/20/2014   MCV 78* 01/20/2014   PLT Clumped Platelets--Appears Adequate 01/20/2014   No results found for this basename: LABCA2   No components found with this basename: FIEPP295   No results found for this basename: INR,  in the last 168 hours  STUDIES: No results  found.  ASSESSMENT/PLAN: [name] is a very pleasant 58 yo female with iron deficiency anemia. She had a gastric bypass in 1974.  She did well with the iron infusion and is holding steady with a Hgb of 11.9  She is feeling much better.  We will wait and see what the rest of her labs show. She is scheduled for her upper and lower scopes on September 10th.  We will see her back in 1 month for labs and a follow up.  All questions were answered and she is in agreement with the plan. She knows to call here with any questions or concerns and to go to the ED in the event of an emergency. We can certainly see her sooner if need be.   Eliezer Bottom, NP 01/20/2014 11:52 AM

## 2014-01-23 ENCOUNTER — Other Ambulatory Visit: Payer: Self-pay | Admitting: Pharmacist

## 2014-01-23 ENCOUNTER — Telehealth: Payer: Self-pay | Admitting: Hematology & Oncology

## 2014-01-23 ENCOUNTER — Other Ambulatory Visit: Payer: Self-pay | Admitting: Obstetrics & Gynecology

## 2014-01-23 ENCOUNTER — Other Ambulatory Visit: Payer: Self-pay | Admitting: Family

## 2014-01-23 DIAGNOSIS — D509 Iron deficiency anemia, unspecified: Secondary | ICD-10-CM

## 2014-01-23 DIAGNOSIS — C541 Malignant neoplasm of endometrium: Secondary | ICD-10-CM

## 2014-01-23 MED ORDER — SODIUM CHLORIDE 0.9 % IV SOLN: 1020.0000 mg | Freq: Once | INTRAVENOUS | Status: DC

## 2014-01-23 NOTE — Telephone Encounter (Signed)
Pt and Baxter Flattery aware of 9-2 iron

## 2014-01-23 NOTE — Telephone Encounter (Signed)
Last refilled/AEX: 01/03/13 #30 gm/0refills by Dr. Bernita Buffy Scheduled: 03/24/14 with Dr. Sabra Heck  Please Advise.

## 2014-01-24 ENCOUNTER — Encounter (HOSPITAL_COMMUNITY): Payer: Self-pay | Admitting: *Deleted

## 2014-01-25 ENCOUNTER — Ambulatory Visit (HOSPITAL_BASED_OUTPATIENT_CLINIC_OR_DEPARTMENT_OTHER): Payer: BC Managed Care – PPO

## 2014-01-25 VITALS — BP 131/74 | HR 79 | Temp 97.7°F | Resp 18

## 2014-01-25 DIAGNOSIS — D509 Iron deficiency anemia, unspecified: Secondary | ICD-10-CM

## 2014-01-25 DIAGNOSIS — C541 Malignant neoplasm of endometrium: Secondary | ICD-10-CM

## 2014-01-25 MED ORDER — NYSTATIN 100000 UNIT/GM EX CREA: 1.0000 "application " | TOPICAL_CREAM | Freq: Two times a day (BID) | CUTANEOUS | Status: DC

## 2014-01-25 MED ORDER — SODIUM CHLORIDE 0.9 % IV SOLN
1020.0000 mg | Freq: Once | INTRAVENOUS | Status: AC
  Administered 2014-01-25: 1020 mg via INTRAVENOUS
  Filled 2014-01-25: qty 34

## 2014-01-25 MED ORDER — SODIUM CHLORIDE 0.9 % IV SOLN
INTRAVENOUS | Status: DC
  Administered 2014-01-25: 14:00:00 via INTRAVENOUS

## 2014-01-25 NOTE — Patient Instructions (Signed)

## 2014-01-27 ENCOUNTER — Ambulatory Visit (INDEPENDENT_AMBULATORY_CARE_PROVIDER_SITE_OTHER): Payer: BC Managed Care – PPO | Admitting: Podiatrist

## 2014-01-27 ENCOUNTER — Encounter: Payer: Self-pay | Admitting: Podiatrist

## 2014-01-27 VITALS — BP 140/66 | HR 86 | Resp 15

## 2014-01-27 DIAGNOSIS — L97509 Non-pressure chronic ulcer of other part of unspecified foot with unspecified severity: Secondary | ICD-10-CM

## 2014-01-27 DIAGNOSIS — L97511 Non-pressure chronic ulcer of other part of right foot limited to breakdown of skin: Secondary | ICD-10-CM

## 2014-01-27 NOTE — Progress Notes (Signed)
Subjective: Patient presents today for followup of ulceration fifth metatarsal head right foot. She states it callused over and is no longer draining. she's been wearing the postoperative shoe as instructed. She is feeling well with no sign of systemic or local infection present.   Objective: Significant C-shaped foot is seen. Prominent fifth metatarsal head is noted. Overlying ulcer has healed nicely. Hyperkeratotic tissue is overlying once debrided reveals intact integument. No drainage, no malodor, no pus or purulence is seen.  Palpable small bone spur is present in the area of pain and irritation  Assessment: Ulceration fifth metatarsal head right with bony exostosis and risk for bone infection due to the proximity of the overlying skin to the underlying bone.   Plan: Debrided the callus tissue and no underlying ulceration is present today's visit. Recommended continued offloading with gauze and dressings and continued use of the surgical shoe. Discussed in the future we will need to remove some of the bone of the fifth metatarsal head she is interested in this procedure in November-- will see her back in 3 weeks for further follow up.

## 2014-01-31 ENCOUNTER — Other Ambulatory Visit: Payer: Self-pay | Admitting: *Deleted

## 2014-01-31 ENCOUNTER — Other Ambulatory Visit (INDEPENDENT_AMBULATORY_CARE_PROVIDER_SITE_OTHER): Payer: BC Managed Care – PPO

## 2014-01-31 DIAGNOSIS — D509 Iron deficiency anemia, unspecified: Secondary | ICD-10-CM

## 2014-01-31 LAB — CBC WITH DIFFERENTIAL/PLATELET
Basophils Absolute: 0 10*3/uL (ref 0.0–0.1)
Basophils Relative: 0.4 % (ref 0.0–3.0)
EOS ABS: 0.3 10*3/uL (ref 0.0–0.7)
Eosinophils Relative: 2.6 % (ref 0.0–5.0)
HEMATOCRIT: 37.1 % (ref 36.0–46.0)
Hemoglobin: 11.9 g/dL — ABNORMAL LOW (ref 12.0–15.0)
LYMPHS ABS: 1.8 10*3/uL (ref 0.7–4.0)
Lymphocytes Relative: 16 % (ref 12.0–46.0)
MCHC: 32.2 g/dL (ref 30.0–36.0)
MCV: 79.3 fl (ref 78.0–100.0)
MONO ABS: 0.6 10*3/uL (ref 0.1–1.0)
Monocytes Relative: 5.6 % (ref 3.0–12.0)
NEUTROS PCT: 75.4 % (ref 43.0–77.0)
Neutro Abs: 8.3 10*3/uL — ABNORMAL HIGH (ref 1.4–7.7)
Platelets: 218 10*3/uL (ref 150.0–400.0)
RBC: 4.68 Mil/uL (ref 3.87–5.11)
RDW: 23.8 % — AB (ref 11.5–15.5)
WBC: 11 10*3/uL — ABNORMAL HIGH (ref 4.0–10.5)

## 2014-02-01 ENCOUNTER — Telehealth: Payer: Self-pay | Admitting: *Deleted

## 2014-02-01 MED ORDER — CIPROFLOXACIN HCL 500 MG PO TABS: 500.0000 mg | ORAL_TABLET | Freq: Two times a day (BID) | ORAL | Status: DC

## 2014-02-01 MED ORDER — CLINDAMYCIN HCL 300 MG PO CAPS: 300.0000 mg | ORAL_CAPSULE | Freq: Three times a day (TID) | ORAL | Status: DC

## 2014-02-01 NOTE — Telephone Encounter (Signed)
cipro 500 bid x 20 and clindamycin 300 #30 tid.  -- please have her in to see me on Friday if it looks infected.

## 2014-02-01 NOTE — Anesthesia Preprocedure Evaluation (Addendum)
Anesthesia Evaluation  Patient identified by MRN, date of birth, ID band Patient awake    Reviewed: Allergy & Precautions, H&P , NPO status , Patient's Chart, lab work & pertinent test results  History of Anesthesia Complications (+) PONV and history of anesthetic complications  Airway Mallampati: III TM Distance: >3 FB Neck ROM: Full    Dental no notable dental hx.    Pulmonary asthma , COPD COPD inhaler, former smoker,  breath sounds clear to auscultation  Pulmonary exam normal       Cardiovascular hypertension, Pt. on medications + dysrhythmias Atrial Fibrillation Rhythm:Irregular Rate:Normal     Neuro/Psych  Headaches, negative psych ROS   GI/Hepatic GERD-  Medicated and Controlled,Hx of fatty liver disease   Endo/Other  Hypothyroidism Morbid obesity  Renal/GU Renal disease  negative genitourinary   Musculoskeletal  (+) Arthritis -,   Abdominal   Peds negative pediatric ROS (+)  Hematology negative hematology ROS (+) anemia ,   Anesthesia Other Findings Uterine cancer  Reproductive/Obstetrics negative OB ROS                          Anesthesia Physical Anesthesia Plan  ASA: III  Anesthesia Plan: MAC   Post-op Pain Management:    Induction: Intravenous  Airway Management Planned: Nasal Cannula  Additional Equipment:   Intra-op Plan:   Post-operative Plan:   Informed Consent: I have reviewed the patients History and Physical, chart, labs and discussed the procedure including the risks, benefits and alternatives for the proposed anesthesia with the patient or authorized representative who has indicated his/her understanding and acceptance.   Dental advisory given  Plan Discussed with: CRNA  Anesthesia Plan Comments:         Anesthesia Quick Evaluation

## 2014-02-01 NOTE — Telephone Encounter (Signed)
I called and informed her that Dr. Valentina Lucks is going to prescribe 2 prescriptions but she said she wants to see you on Friday if it's infected.  She stated it is draining and it hurts.  She requested to be scheduled for Friday.  I transferred her to a scheduler.  Prescriptions were e-scribed to [facility].

## 2014-02-01 NOTE — Telephone Encounter (Signed)
It looks like the wound has gotten infected.  I'd like to have an antibiotic called in today.  I'd appreciate it, thanks.

## 2014-02-02 ENCOUNTER — Other Ambulatory Visit: Payer: Self-pay

## 2014-02-02 ENCOUNTER — Telehealth: Payer: Self-pay

## 2014-02-02 ENCOUNTER — Encounter (HOSPITAL_COMMUNITY): Payer: Self-pay | Admitting: *Deleted

## 2014-02-02 ENCOUNTER — Ambulatory Visit (HOSPITAL_COMMUNITY)
Admission: RE | Admit: 2014-02-02 | Discharge: 2014-02-02 | Disposition: A | Discharge status: Home / Self Care | Payer: BC Managed Care – PPO | Source: Ambulatory Visit | Attending: Internal Medicine | Admitting: Internal Medicine

## 2014-02-02 ENCOUNTER — Encounter (HOSPITAL_COMMUNITY): Payer: BC Managed Care – PPO | Admitting: Anesthesiology

## 2014-02-02 ENCOUNTER — Ambulatory Visit (HOSPITAL_COMMUNITY): Payer: BC Managed Care – PPO | Admitting: Anesthesiology

## 2014-02-02 ENCOUNTER — Encounter (HOSPITAL_COMMUNITY)
Admission: RE | Disposition: A | Discharge status: Home / Self Care | Payer: Self-pay | Source: Ambulatory Visit | Attending: Internal Medicine

## 2014-02-02 DIAGNOSIS — K921 Melena: Secondary | ICD-10-CM

## 2014-02-02 DIAGNOSIS — Z87891 Personal history of nicotine dependence: Secondary | ICD-10-CM | POA: Diagnosis not present

## 2014-02-02 DIAGNOSIS — Z1211 Encounter for screening for malignant neoplasm of colon: Secondary | ICD-10-CM | POA: Insufficient documentation

## 2014-02-02 DIAGNOSIS — Z8542 Personal history of malignant neoplasm of other parts of uterus: Secondary | ICD-10-CM | POA: Insufficient documentation

## 2014-02-02 DIAGNOSIS — K209 Esophagitis, unspecified without bleeding: Secondary | ICD-10-CM | POA: Insufficient documentation

## 2014-02-02 DIAGNOSIS — Z9884 Bariatric surgery status: Secondary | ICD-10-CM | POA: Diagnosis not present

## 2014-02-02 DIAGNOSIS — Z880 Allergy status to penicillin: Secondary | ICD-10-CM | POA: Insufficient documentation

## 2014-02-02 DIAGNOSIS — E039 Hypothyroidism, unspecified: Secondary | ICD-10-CM | POA: Insufficient documentation

## 2014-02-02 DIAGNOSIS — K648 Other hemorrhoids: Secondary | ICD-10-CM | POA: Diagnosis not present

## 2014-02-02 DIAGNOSIS — D509 Iron deficiency anemia, unspecified: Secondary | ICD-10-CM

## 2014-02-02 DIAGNOSIS — K2100 Gastro-esophageal reflux disease with esophagitis, without bleeding: Secondary | ICD-10-CM

## 2014-02-02 DIAGNOSIS — E559 Vitamin D deficiency, unspecified: Secondary | ICD-10-CM | POA: Insufficient documentation

## 2014-02-02 DIAGNOSIS — K219 Gastro-esophageal reflux disease without esophagitis: Secondary | ICD-10-CM | POA: Insufficient documentation

## 2014-02-02 DIAGNOSIS — I4891 Unspecified atrial fibrillation: Secondary | ICD-10-CM | POA: Diagnosis not present

## 2014-02-02 DIAGNOSIS — I10 Essential (primary) hypertension: Secondary | ICD-10-CM | POA: Diagnosis not present

## 2014-02-02 DIAGNOSIS — Z85850 Personal history of malignant neoplasm of thyroid: Secondary | ICD-10-CM | POA: Diagnosis not present

## 2014-02-02 DIAGNOSIS — J449 Chronic obstructive pulmonary disease, unspecified: Secondary | ICD-10-CM | POA: Insufficient documentation

## 2014-02-02 DIAGNOSIS — Z91018 Allergy to other foods: Secondary | ICD-10-CM | POA: Diagnosis not present

## 2014-02-02 DIAGNOSIS — J4489 Other specified chronic obstructive pulmonary disease: Secondary | ICD-10-CM | POA: Insufficient documentation

## 2014-02-02 DIAGNOSIS — Z538 Procedure and treatment not carried out for other reasons: Secondary | ICD-10-CM

## 2014-02-02 HISTORY — DX: Malignant neoplasm of uterus, part unspecified: C55

## 2014-02-02 HISTORY — PX: COLONOSCOPY WITH PROPOFOL: SHX5780

## 2014-02-02 HISTORY — DX: Esophagitis, unspecified without bleeding: K20.90

## 2014-02-02 HISTORY — PX: ESOPHAGOGASTRODUODENOSCOPY (EGD) WITH PROPOFOL: SHX5813

## 2014-02-02 SURGERY — COLONOSCOPY WITH PROPOFOL
Anesthesia: Monitor Anesthesia Care

## 2014-02-02 MED ORDER — MIDAZOLAM HCL 2 MG/2ML IJ SOLN
INTRAMUSCULAR | Status: AC
  Filled 2014-02-02: qty 2

## 2014-02-02 MED ORDER — PROPOFOL 10 MG/ML IV BOLUS
INTRAVENOUS | Status: AC
  Filled 2014-02-02: qty 20

## 2014-02-02 MED ORDER — PROPOFOL 10 MG/ML IV BOLUS
INTRAVENOUS | Status: DC | PRN
  Administered 2014-02-02: 60 mg via INTRAVENOUS
  Administered 2014-02-02: 50 mg via INTRAVENOUS
  Administered 2014-02-02: 40 mg via INTRAVENOUS
  Administered 2014-02-02: 60 mg via INTRAVENOUS
  Administered 2014-02-02 (×2): 40 mg via INTRAVENOUS
  Administered 2014-02-02: 60 mg via INTRAVENOUS
  Administered 2014-02-02: 40 mg via INTRAVENOUS
  Administered 2014-02-02: 60 mg via INTRAVENOUS
  Administered 2014-02-02: 50 mg via INTRAVENOUS

## 2014-02-02 MED ORDER — LACTATED RINGERS IV SOLN
INTRAVENOUS | Status: DC
  Administered 2014-02-02: 12:00:00 via INTRAVENOUS
  Administered 2014-02-02: 1000 mL via INTRAVENOUS

## 2014-02-02 MED ORDER — BUTAMBEN-TETRACAINE-BENZOCAINE 2-2-14 % EX AERO
INHALATION_SPRAY | CUTANEOUS | Status: DC | PRN
  Administered 2014-02-02: 2 via TOPICAL

## 2014-02-02 MED ORDER — MIDAZOLAM HCL 5 MG/5ML IJ SOLN
INTRAMUSCULAR | Status: DC | PRN
  Administered 2014-02-02 (×3): 1 mg via INTRAVENOUS

## 2014-02-02 MED ORDER — FENTANYL CITRATE 0.05 MG/ML IJ SOLN
INTRAMUSCULAR | Status: DC | PRN
  Administered 2014-02-02 (×2): 50 ug via INTRAVENOUS

## 2014-02-02 MED ORDER — FENTANYL CITRATE 0.05 MG/ML IJ SOLN
INTRAMUSCULAR | Status: AC
  Filled 2014-02-02: qty 2

## 2014-02-02 MED ORDER — OMEPRAZOLE 40 MG PO CPDR: 40.0000 mg | DELAYED_RELEASE_CAPSULE | Freq: Every day | ORAL | Status: DC

## 2014-02-02 SURGICAL SUPPLY — 25 items

## 2014-02-02 NOTE — Op Note (Signed)
[facility] [place], [address]   ENDOSCOPY PROCEDURE REPORT  PATIENT: Jamie, Rogers  MR#: [id number] BIRTHDATE: [birth year] , 57  yrs. old GENDER: Female ENDOSCOPIST: Eustace Quail, MD REFERRED BY:  Debbrah Alar, FNP PROCEDURE DATE:  02/02/2014 PROCEDURE:  EGD, diagnostic ASA CLASS:     Class III INDICATIONS:  Iron deficiency anemia. MEDICATIONS: MAC sedation, administered by CRNA and See Anesthesia Report. TOPICAL ANESTHETIC: none  DESCRIPTION OF PROCEDURE: After the risks benefits and alternatives of the procedure were thoroughly explained, informed consent was obtained.  The Pentax Gastroscope [id number] endoscope was introduced through the mouth and advanced to the third portion of the duodenum. Without limitations.  The instrument was slowly withdrawn as the mucosa was fully examined.      EXAM:The esophagus revealed distal esophagitis as manifested by linear erosions.  Also, suggestion of stricture formation.  The stomach was normal.  The duodenum was normal.  No evidence for prior gastric bypass surgery.  Retroflexed views revealed no abnormalities.     The scope was then withdrawn from the patient and the procedure completed.  COMPLICATIONS: There were no complications. ENDOSCOPIC IMPRESSION: The esophagus revealed distal esophagitis as manifested by linear erosions.  This may explain iron deficiency anemia in the face of chronic anticoagulation .  RECOMMENDATIONS: 1. Change from Zegerid 20 mg daily to omeprazole 40 mg daily. my nurse will call this in for you 2. Further recommendations to be determined after completion a virtual colonoscopy  REPEAT EXAM:  eSigned:  Eustace Quail, MD 02/02/2014 12:10 PM   ZL:[id number] Sylvester Harder and The Patient

## 2014-02-02 NOTE — Transfer of Care (Signed)
Immediate Anesthesia Transfer of Care Note  Patient: [name]  Procedure(s) Performed: Procedure(s): COLONOSCOPY WITH PROPOFOL (N/A) ESOPHAGOGASTRODUODENOSCOPY (EGD) WITH PROPOFOL (N/A)  Patient Location: PACU and Endoscopy Unit  Anesthesia Type:MAC  Level of Consciousness: awake, alert , oriented, patient cooperative and responds to stimulation  Airway & Oxygen Therapy: Patient Spontanous Breathing and Patient connected to nasal cannula oxygen  Post-op Assessment: Report given to PACU RN, Post -op Vital signs reviewed and stable and Patient moving all extremities  Post vital signs: Reviewed and stable  Complications: No apparent anesthesia complications

## 2014-02-02 NOTE — Telephone Encounter (Signed)
Pt scheduled for virtual colon at [place] location October 5 at 10am, pt to arrive there at 9:45am. Pt to go by location 3 days prior to exam to pick up prep and instructions. Pt aware of appt.

## 2014-02-02 NOTE — Op Note (Signed)
[facility] [place], [address]   COLONOSCOPY PROCEDURE REPORT  PATIENT: [name], [name]  MR#: [id number] BIRTHDATE: [birth year] , 57  yrs. old GENDER: Female ENDOSCOPIST: Eustace Quail, MD REFERRED GP:[id number] O'Sullivan, FNP PROCEDURE DATE:  02/02/2014 PROCEDURE:   Colonoscopy, diagnostic First Screening Colonoscopy - Avg.  risk and is 50 yrs.  old or older - No.  Prior Negative Screening - Now for repeat screening. N/A  History of Adenoma - Now for follow-up colonoscopy & has been > or = to 3 yrs.  N/A  Polyps Removed Today? No.  Recommend repeat exam, <10 yrs? No. ASA CLASS:   Class III INDICATIONS:Iron Deficiency Anemia. MEDICATIONS: MAC sedation, administered by CRNA and See Anesthesia Report.  DESCRIPTION OF PROCEDURE:   After the risks benefits and alternatives of the procedure were thoroughly explained, informed consent was obtained.  A digital rectal exam revealed no abnormalities of the rectum.   The Pentax Ped Colon [id number] endoscope was introduced through the anus and advanced to the hepatic flexure. No adverse events experienced.   The quality of the prep was Moviprep fair  The instrument was then slowly withdrawn as the colon was fully examined.      COLON FINDINGS: The colonoscope was advanced to the level of the hepatic flexure.  The colon was very tortuous.  The preparation fair.  Despite great effort, unable to advance to the cecum. Retroflexed views revealed internal hemorrhoids. The time to cecum=minutes 0 seconds.  Withdrawal time=minutes 0 seconds.  The scope was withdrawn and the procedure completed. COMPLICATIONS: There were no complications.  ENDOSCOPIC IMPRESSION: 1. Incomplete colonoscopy 2. Fair prep.  RECOMMENDATIONS: 1. Virtual colonoscopy (my office will contact you to schedule the examination) 2.Upper endoscopy today (see report   eSigned:  Eustace Quail, MD 02/02/2014 12:02 PM   cc:  Debbrah Alar FNP and The Patient

## 2014-02-02 NOTE — Interval H&P Note (Signed)
History and Physical Interval Note:  02/02/2014 9:04 AM  [name]  has presented today for surgery, with the diagnosis of Microcytic anemia  The various methods of treatment have been discussed with the patient and family. After consideration of risks, benefits and other options for treatment, the patient has consented to  Procedure(s): COLONOSCOPY WITH PROPOFOL (N/A) ESOPHAGOGASTRODUODENOSCOPY (EGD) WITH PROPOFOL (N/A) as a surgical intervention .  The patient's history has been reviewed, patient examined, no change in status, stable for surgery.  I have reviewed the patient's chart and labs.  Questions were answered to the patient's satisfaction.     Milus Banister

## 2014-02-02 NOTE — H&P (Signed)
HISTORY OF PRESENT ILLNESS:  [name] is a 58 y.o. female who was evaluated in the office 12/14/2013 regarding iron deficiency anemia. No evidence of GI bleeding. Reports similar problems in the past with negative GI workup in [place]. Also reports that she had prior gastric bypass surgery in 1974. No details. She had done Xarelto. Not now. She has received iron infusions. Hemoglobin has improved. 11.9   2 days ago. She is now for colonoscopy and upper endoscopy  REVIEW OF SYSTEMS:  All non-GI ROS negative except for arthritis  Past Medical History  Diagnosis Date  . Allergic rhinitis   . Asthma   . Hypertension   . Hypothyroidism   . Anemia, iron deficiency   . Vitamin D deficiency   . Morbid obesity   . GERD (gastroesophageal reflux disease)   . Hypoparathyroidism 01/07/2011  . Arthritis   . Fatty liver 01/07/2012  . Headache(784.0)     occasional sinus headache   . Atrial fibrillation August 2012    OFF XARELTO LAST MONTH DUE TO BLEEDING IN STOOL  . History of blood transfusion JULY 2015  . Fibroid 1974    fibroid cyst on left fallopian tube  . Abnormal Pap smear     years ago/no biopsy  . Bursitis of hip left  . Edema of both legs   . Enlarged liver   . Iron deficiency anemia   . Foot ulcer     AREA HEALED RIGHT FOOT  . Seizures     infancy secondary to fever  . Nephrolithiasis     SEES DR Risa Grill  . Thyroid cancer 2011    THYROIDECTOMY DONE  . Uterine cancer DX 2014    NO CURRENT TX FOR  . PONV (postoperative nausea and vomiting)     Past Surgical History  Procedure Laterality Date  . Tonsillectomy and adenoidectomy    . Bunionectomy      bilateral  . Appendectomy    . Gastric bypass  1974  . Thyroidectomy  05/15/2010  . Cyst on ovary removed     . Ureteroscopy  03/03/2012    Procedure: URETEROSCOPY;  Surgeon: Bernestine Amass, MD;  Location: [facility];  Service: Urology;  Laterality: Left;  . Cystoscopy w/ retrogrades  03/03/2012    Procedure:  CYSTOSCOPY WITH RETROGRADE PYELOGRAM;  Surgeon: Bernestine Amass, MD;  Location: [facility];  Service: Urology;  Laterality: Bilateral;  . Lithotripsy  03/2012  . Cystoscopy with retrograde pyelogram, ureteroscopy and stent placement Left 08/25/2012    Procedure: CYSTOSCOPY WITH RETROGRADE PYELOGRAM, URETEROSCOPY;  Surgeon: Bernestine Amass, MD;  Location: [facility];  Service: Urology;  Laterality: Left;  . Holmium laser application Left 01/27/8545    Procedure: HOLMIUM LASER APPLICATION;  Surgeon: Bernestine Amass, MD;  Location: [facility];  Service: Urology;  Laterality: Left;  . Dilation and curettage of uterus N/A 02/21/2013    Procedure: DILATATION AND CURETTAGE;  Surgeon: Lyman Speller, MD;  Location: [facility];  Service: Gynecology;  Laterality: N/A;    Social History DAWT REEB  reports that she quit smoking about 18 years ago. Her smoking use included Cigarettes. She started smoking about 43 years ago. She has a 12.5 pack-year smoking history. She has never used smokeless tobacco. She reports that she drinks alcohol. She reports that she does not use illicit drugs.  family history includes Asthma in her brother; Diabetes in her father; Heart attack in her father and mother; Heart disease in her brother  and mother; Hypertension in her brother, father, and mother; Hyperthyroidism in her mother; Lung cancer in her father. There is no history of Colon cancer, Esophageal cancer, Stomach cancer, Kidney disease, or Liver disease.  Allergies  Allergen Reactions  . Other Anaphylaxis    NUTS  . Amoxicillin-Pot Clavulanate Other (See Comments)    headache  . Penicillins Other (See Comments)    headache       PHYSICAL EXAMINATION:Vital signs: BP 139/63  Pulse 73  Temp(Src) 98.3 F (36.8 C) (Oral)  Resp 13  SpO2 100%  LMP 05/26/2009  Constitutional: Morbidly obese, no acute distress Psychiatric: alert and oriented x3, cooperative Eyes: extraocular movements intact, anicteric, conjunctiva  pink Mouth: oral pharynx moist, no lesions Neck: supple no lymphadenopathy Cardiovascular: heart regular rate and rhythm, no murmur Lungs: clear to auscultation bilaterally Abdomen: soft, markedly obese, nontender, nondistended, no obvious ascites, no peritoneal signs, normal bowel sounds, no organomegaly Rectal: See colonoscopy Extremities: no lower extremity edema bilaterally Skin: no lesions on visible extremities Neuro: No focal deficits.    ASSESSMENT:  #1. Iron deficiency anemia #2. Medical problems   PLAN:  #1. Colonoscopy and upper endoscopy.The nature of the procedure, as well as the risks, benefits, and alternatives were carefully and thoroughly reviewed with the patient. Ample time for discussion and questions allowed. The patient understood, was satisfied, and agreed to proceed.

## 2014-02-02 NOTE — Discharge Instructions (Signed)
Colonoscopy, Care After °Refer to this sheet in the next few weeks. These instructions provide you with information on caring for yourself after your procedure. Your health care provider may also give you more specific instructions. Your treatment has been planned according to current medical practices, but problems sometimes occur. Call your health care provider if you have any problems or questions after your procedure. °WHAT TO EXPECT AFTER THE PROCEDURE  °After your procedure, it is typical to have the following: °· A small amount of blood in your stool. °· Moderate amounts of gas and mild abdominal cramping or bloating. °HOME CARE INSTRUCTIONS °· Do not drive, operate machinery, or sign important documents for 24 hours. °· You may shower and resume your regular physical activities, but move at a slower pace for the first 24 hours. °· Take frequent rest periods for the first 24 hours. °· Walk around or put a warm pack on your abdomen to help reduce abdominal cramping and bloating. °· Drink enough fluids to keep your urine clear or pale yellow. °· You may resume your normal diet as instructed by your health care provider. Avoid heavy or fried foods that are hard to digest. °· Avoid drinking alcohol for 24 hours or as instructed by your health care provider. °· Only take over-the-counter or prescription medicines as directed by your health care provider. °· If a tissue sample (biopsy) was taken during your procedure: °· Do not take aspirin or blood thinners for 7 days, or as instructed by your health care provider. °· Do not drink alcohol for 7 days, or as instructed by your health care provider. °· Eat soft foods for the first 24 hours. °SEEK MEDICAL CARE IF: °You have persistent spotting of blood in your stool 2-3 days after the procedure. °SEEK IMMEDIATE MEDICAL CARE IF: °· You have more than a small spotting of blood in your stool. °· You pass large blood clots in your stool. °· Your abdomen is swollen  (distended). °· You have nausea or vomiting. °· You have a fever. °· You have increasing abdominal pain that is not relieved with medicine. °Document Released: 12/25/2003 Document Revised: 03/02/2013 Document Reviewed: 01/17/2013 °ExitCare® Patient Information ©2015 [employer]. This information is not intended to replace advice given to you by your health care provider. Make sure you discuss any questions you have with your health care provider. °Esophagogastroduodenoscopy °Care After °Refer to this sheet in the next few weeks. These instructions provide you with information on caring for yourself after your procedure. Your caregiver may also give you more specific instructions. Your treatment has been planned according to current medical practices, but problems sometimes occur. Call your caregiver if you have any problems or questions after your procedure.  °HOME CARE INSTRUCTIONS °· Do not eat or drink anything until the numbing medicine (local anesthetic) has worn off and your gag reflex has returned. You will know that the local anesthetic has worn off when you can swallow comfortably. °· Do not drive for 12 hours after the procedure or as directed by your caregiver. °· Only take medicines as directed by your caregiver. °SEEK MEDICAL CARE IF:  °· You cannot stop coughing. °· You are not urinating at all or less than usual. °SEEK IMMEDIATE MEDICAL CARE IF: °· You have difficulty swallowing. °· You cannot eat or drink. °· You have worsening throat or chest pain. °· You have dizziness, lightheadedness, or you faint. °· You have nausea or vomiting. °· You have chills. °· You have a   fever. °· You have severe abdominal pain. °· You have black, tarry, or bloody stools. °Document Released: 04/28/2012 Document Reviewed: 04/28/2012 °ExitCare® Patient Information ©2015 [employer]. This information is not intended to replace advice given to you by your health care provider. Make sure you discuss any questions you have  with your health care provider. ° ° °

## 2014-02-02 NOTE — Anesthesia Postprocedure Evaluation (Signed)
  Anesthesia Post-op Note  Patient: [name]  Procedure(s) Performed: Procedure(s) (LRB): COLONOSCOPY WITH PROPOFOL (N/A) ESOPHAGOGASTRODUODENOSCOPY (EGD) WITH PROPOFOL (N/A)  Patient Location: PACU  Anesthesia Type: MAC  Level of Consciousness: awake and alert   Airway and Oxygen Therapy: Patient Spontanous Breathing  Post-op Pain: mild  Post-op Assessment: Post-op Vital signs reviewed, Patient's Cardiovascular Status Stable, Respiratory Function Stable, Patent Airway and No signs of Nausea or vomiting  Last Vitals:  Filed Vitals:   02/02/14 1148  BP: 139/63  Pulse: 73  Temp:   Resp: 13    Post-op Vital Signs: stable   Complications: No apparent anesthesia complications

## 2014-02-02 NOTE — H&P (View-Only) (Signed)
[facility]  [phone] [phone]) [phone]  ID: [name] OB: 1955-11-25 MR#: [id number]#:[id number] Patient Care Team: Debbrah Alar, NP as PCP - General (Internal Medicine) Deneise Lever, MD (Pulmonary Disease) Lelon Perla, MD (Cardiology) Delrae Rend, MD (Internal Medicine)  DIAGNOSIS: iron deficiency anemia  INTERVAL HISTORY: [name] is a very pleasant 58 yo female with iron deficiency anemia. She is here today for a follow-up appointment. She is doing much better since her iron infusion. Her energy is much improved. She has had a couple of stools with some bright red blood earlier this month but it stopped and she has not had it happen since. She is scheduled to have an upper and lower scope on September 10th. The wound on her right foot is a little better but she has to have it worked on again in the next few months. The antibiotics are giving her diarrhea. We talked about the BRAT diet and she is going to try this. She did have gastric bypass in 1074. She also has uterine cancer. She has an IUD in right now and she may have a hysterectomy in October. Her oncologist is still considering this. Her appetite is good and she is drinking plenty of fluids..   CURRENT TREATMENT: IV iron infusions as needed  REVIEW OF SYSTEMS: All other 10 point review of systems is negative except for those issues mentioned above.   PAST MEDICAL HISTORY: Past Medical History  Diagnosis Date  . Allergic rhinitis   . Asthma   . Hypertension   . Hypothyroidism   . Anemia, iron deficiency   . Vitamin D deficiency   . Thyroid cancer   . Morbid obesity   . GERD (gastroesophageal reflux disease)   . Hypoparathyroidism 01/07/2011  . Arthritis   . Fatty liver 01/07/2012  . Headache(784.0)     occasional sinus headache   . Atrial fibrillation August 2012  . PONV (postoperative nausea and vomiting)   . History of blood transfusion   . Fibroid 1974    fibroid  cyst on left fallopian tube  . Abnormal Pap smear     years ago/no biopsy  . Nephrolithiasis 2012  . Bursitis of hip left  . Edema of both legs   . Seizures     infancy secondary to fever  . Fibromyalgia   . Enlarged liver   . Iron deficiency anemia   . Foot ulcer    PAST SURGICAL HISTORY: Past Surgical History  Procedure Laterality Date  . Tonsillectomy and adenoidectomy    . Bunionectomy      bilateral  . Appendectomy    . Gastric bypass  1974  . Thyroidectomy  05/15/2010  . Cyst on ovary removed     . Ureteroscopy  03/03/2012    Procedure: URETEROSCOPY;  Surgeon: Bernestine Amass, MD;  Location: [facility];  Service: Urology;  Laterality: Left;  . Cystoscopy w/ retrogrades  03/03/2012    Procedure: CYSTOSCOPY WITH RETROGRADE PYELOGRAM;  Surgeon: Bernestine Amass, MD;  Location: [facility];  Service: Urology;  Laterality: Bilateral;  . Lithotripsy  03/2012  . Cystoscopy with retrograde pyelogram, ureteroscopy and stent placement Left 08/25/2012    Procedure: CYSTOSCOPY WITH RETROGRADE PYELOGRAM, URETEROSCOPY;  Surgeon: Bernestine Amass, MD;  Location: [facility];  Service: Urology;  Laterality: Left;  . Holmium laser application Left 10/26/2295    Procedure: HOLMIUM LASER APPLICATION;  Surgeon: Bernestine Amass, MD;  Location: [facility];  Service: Urology;  Laterality: Left;  . Dilation and curettage of uterus N/A 02/21/2013    Procedure: DILATATION AND CURETTAGE;  Surgeon: Lyman Speller, MD;  Location: [facility];  Service: Gynecology;  Laterality: N/A;   FAMILY HISTORY Family History  Problem Relation Age of Onset  . Hypertension Father   . Diabetes Father   . Lung cancer Father   . Heart attack Father     MI at age 55  . Hypertension Mother   . Hyperthyroidism Mother   . Heart disease Mother   . Heart attack Mother     MI at age 68  . Asthma Brother   . Hypertension Brother     younger  . Heart disease Brother     older  . Colon cancer Neg Hx   . Esophageal cancer Neg Hx   . Stomach  cancer Neg Hx   . Kidney disease Neg Hx   . Liver disease Neg Hx    GYNECOLOGIC HISTORY:  Patient's last menstrual period was 05/26/2009.   SOCIAL HISTORY:  History   Social History  . Marital Status: Single    Spouse Name: N/A    Number of Children: 0  . Years of Education: N/A   Occupational History  . works in Insurance claims handler   . [employer]    Social History Main Topics  . Smoking status: Former Smoker -- 0.50 packs/day for 25 years    Types: Cigarettes    Start date: 12/24/1970    Quit date: 05/27/1995  . Smokeless tobacco: Never Used     Comment: quit smoking 17 years ago  . Alcohol Use: Yes     Comment: 1/2 glass per month  . Drug Use: No  . Sexual Activity: Yes    Partners: Male   Other Topics Concern  . Not on file   Social History Narrative   Occupation: works in Insurance claims handler - Field seismologist   Single       Former Smoker - quit tobacco 12 years ago.  She was light smoker for 10 years.                ADVANCED DIRECTIVES: <no information>  HEALTH MAINTENANCE: History  Substance Use Topics  . Smoking status: Former Smoker -- 0.50 packs/day for 25 years    Types: Cigarettes    Start date: 12/24/1970    Quit date: 05/27/1995  . Smokeless tobacco: Never Used     Comment: quit smoking 17 years ago  . Alcohol Use: Yes     Comment: 1/2 glass per month   Colonoscopy: PAP: Bone density: Lipid panel:  Allergies  Allergen Reactions  . Other Anaphylaxis    NUTS  . Amoxicillin-Pot Clavulanate Other (See Comments)    headache  . Penicillins Other (See Comments)    headache    Current Outpatient Prescriptions  Medication Sig Dispense Refill  . albuterol (PROVENTIL) (2.5 MG/3ML) 0.083% nebulizer solution Take 2.5 mg by nebulization every 6 (six) hours as needed for wheezing or shortness of breath.      . Ascorbic Acid (VITAMIN C CR) 1000 MG TBCR Take 1 tablet by mouth 2 (two) times a week. On Tuesday and Thursday.      .  Black Cohosh 40 MG CAPS Take 1 capsule by mouth daily.       . budesonide-formoterol (SYMBICORT) 160-4.5 MCG/ACT inhaler Inhale 2 puffs into the lungs 2 (two) times daily.      . calcitRIOL (ROCALTROL) 0.5 MCG  capsule Take 0.411 mcg by mouth 2 (two) times daily.       . calcium carbonate (TUMS - DOSED IN MG ELEMENTAL CALCIUM) 500 MG chewable tablet Chew 1 tablet by mouth daily.       . cetirizine (ZYRTEC) 10 MG tablet Take 10 mg by mouth daily.      [name] diltiazem (CARDIZEM CD) 240 MG 24 hr capsule Take 240 mg by mouth daily.      . ergocalciferol (VITAMIN D2) 50000 UNITS capsule Take 50,000 Units by mouth 2 (two) times a week. On Saturday and Wednesday      . fluticasone (FLONASE) 50 MCG/ACT nasal spray Place 2 sprays into both nostrils daily.      . furosemide (LASIX) 40 MG tablet Take 40 mg by mouth daily.      . hydrochlorothiazide (HYDRODIURIL) 25 MG tablet Take 25 mg by mouth daily.      . iron polysaccharides (NU-IRON) 150 MG capsule Take 150 mg by mouth daily.       [name] levothyroxine (SYNTHROID, LEVOTHROID) 137 MCG tablet Take 411 mcg by mouth daily. Takes 3 tabs in AM      . methocarbamol (ROBAXIN) 500 MG tablet Take 500 mg by mouth 2 (two) times daily.       [name] nystatin cream (MYCOSTATIN) Apply 1 application topically as needed.       Earney Navy Bicarbonate (ZEGERID) 20-1100 MG CAPS Take 1 capsule by mouth daily before breakfast.       . Potassium Citrate 15 MEQ (1620 MG) TBCR Take by mouth 2 (two) times daily.      . traMADol (ULTRAM) 50 MG tablet TAKE 1 TABLET BY MOUTH EVERY 6 HOURS AS NEEDED  30 tablet  0  . UNABLE TO FIND MANGANESE 60MG  TAKE ONE PILL WITH EACH MEAL THREE TIMES DAILY      . valsartan (DIOVAN) 80 MG tablet Take 80 mg by mouth daily.      . vitamin B-12 (CYANOCOBALAMIN) 1000 MCG tablet Take 1,000 mcg by mouth 2 (two) times a week. On Tuesday and Thursday with Iron.      . Vitamin D, Ergocalciferol, (DRISDOL) 50000 UNITS CAPS capsule Take by mouth.      . vitamin E  (VITAMIN E) 1000 UNIT capsule Take 1 two times a week.      . zafirlukast (ACCOLATE) 20 MG tablet Take 20 mg by mouth 2 (two) times daily before a meal.       No current facility-administered medications for this visit.   OBJECTIVE: Filed Vitals:   01/20/14 1040  BP: 119/50  Pulse: 80  Temp: 98.1 F (36.7 C)  Resp: 16   Body mass index is 53.01 kg/(m^2). ECOG FS:0 - Asymptomatic Ocular: Sclerae unicteric, pupils equal, round and reactive to light Ear-nose-throat: Oropharynx clear, dentition fair Lymphatic: No cervical or supraclavicular adenopathy Lungs no rales or rhonchi, good excursion bilaterally Heart regular rate and rhythm, no murmur appreciated Abd soft, nontender, positive bowel sounds MSK no focal spinal tenderness, no joint edema Neuro: non-focal, well-oriented, appropriate affect Breasts: Deferred  LAB RESULTS: CMP     Component Value Date/Time   NA 141 11/23/2013 0859   K 3.7 11/23/2013 0859   CL 102 11/23/2013 0859   CO2 28 11/23/2013 0859   GLUCOSE 101* 11/23/2013 0859   BUN 18 11/23/2013 0859   CREATININE 0.80 11/23/2013 0859   CREATININE 0.69 02/16/2013 1420   CALCIUM 8.2* 11/23/2013 0859   CALCIUM 8.0* 05/27/2010 1855  PROT 7.2 06/12/2010 1356   ALBUMIN 4.1 06/12/2010 1356   AST 23 06/12/2010 1356   ALT 27 06/12/2010 1356   ALKPHOS 98 06/12/2010 1356   BILITOT 0.3 06/12/2010 1356   GFRNONAA 83 11/23/2013 0859   GFRNONAA >90 02/16/2013 1420   GFRAA >89 11/23/2013 0859   GFRAA >90 02/16/2013 1420   No results found for this basename: SPEP,  UPEP,   kappa and lambda light chains   Lab Results  Component Value Date   WBC 10.7* 01/20/2014   NEUTROABS 8.3* 01/20/2014   HGB 11.9 01/20/2014   HCT 38.5 01/20/2014   MCV 78* 01/20/2014   PLT Clumped Platelets--Appears Adequate 01/20/2014   No results found for this basename: LABCA2   No components found with this basename: YOKHT977   No results found for this basename: INR,  in the last 168 hours  STUDIES: No results  found.  ASSESSMENT/PLAN: [name] is a very pleasant 58 yo female with iron deficiency anemia. She had a gastric bypass in 1974.  She did well with the iron infusion and is holding steady with a Hgb of 11.9  She is feeling much better.  We will wait and see what the rest of her labs show. She is scheduled for her upper and lower scopes on September 10th.  We will see her back in 1 month for labs and a follow up.  All questions were answered and she is in agreement with the plan. She knows to call here with any questions or concerns and to go to the ED in the event of an emergency. We can certainly see her sooner if need be.   Eliezer Bottom, NP 01/20/2014 11:52 AM

## 2014-02-03 ENCOUNTER — Encounter (HOSPITAL_COMMUNITY): Payer: Self-pay | Admitting: Internal Medicine

## 2014-02-03 ENCOUNTER — Ambulatory Visit (INDEPENDENT_AMBULATORY_CARE_PROVIDER_SITE_OTHER): Payer: BC Managed Care – PPO | Admitting: Obstetrics & Gynecology

## 2014-02-03 ENCOUNTER — Ambulatory Visit (INDEPENDENT_AMBULATORY_CARE_PROVIDER_SITE_OTHER): Payer: BC Managed Care – PPO | Admitting: Podiatrist

## 2014-02-03 ENCOUNTER — Telehealth: Payer: Self-pay | Admitting: Obstetrics & Gynecology

## 2014-02-03 VITALS — BP 136/78 | HR 88 | Resp 20 | Wt 309.8 lb

## 2014-02-03 VITALS — BP 134/75 | HR 87 | Temp 99.6°F | Resp 16

## 2014-02-03 DIAGNOSIS — L97511 Non-pressure chronic ulcer of other part of right foot limited to breakdown of skin: Secondary | ICD-10-CM

## 2014-02-03 DIAGNOSIS — M21621 Bunionette of right foot: Secondary | ICD-10-CM

## 2014-02-03 DIAGNOSIS — N898 Other specified noninflammatory disorders of vagina: Secondary | ICD-10-CM

## 2014-02-03 DIAGNOSIS — N949 Unspecified condition associated with female genital organs and menstrual cycle: Secondary | ICD-10-CM

## 2014-02-03 DIAGNOSIS — N939 Abnormal uterine and vaginal bleeding, unspecified: Secondary | ICD-10-CM

## 2014-02-03 DIAGNOSIS — N938 Other specified abnormal uterine and vaginal bleeding: Secondary | ICD-10-CM

## 2014-02-03 DIAGNOSIS — N950 Postmenopausal bleeding: Secondary | ICD-10-CM

## 2014-02-03 DIAGNOSIS — M21619 Bunion of unspecified foot: Secondary | ICD-10-CM

## 2014-02-03 DIAGNOSIS — L97509 Non-pressure chronic ulcer of other part of unspecified foot with unspecified severity: Secondary | ICD-10-CM

## 2014-02-03 NOTE — Patient Instructions (Signed)
Soak in epsom salt water daily for about 10 to 20 minutes.  Keep taking the antibiotic.  I'll check it again in a week to 2 weeks to make sure its doing well- if it flares up again, give me a call and we can set up a MRI.

## 2014-02-03 NOTE — Telephone Encounter (Signed)
Spoke with patient. Advised of importance of coming in for evaluation. Scheduled appointment today at [facility] with [facility] (per Gay Filler). Patient agreeable to date and time.  Routing to provider for final review. Patient agreeable to disposition. Will close encounter

## 2014-02-03 NOTE — Telephone Encounter (Signed)
Pt said she is having some abnormal spotting for about a month. Pt said she had to have a transfusion.

## 2014-02-03 NOTE — Progress Notes (Signed)
Subjective: Patient presents today for followup of ulceration fifth metatarsal head right foot. She states it started hurting terribly on Tuesday then it burst open.  She got some fluid out of the foot that she described as a toothpaste like consistency.  She states that since it burst the pain subsided.  She has been taking the cipro and clindamycin as instructed. she's been wearing the postoperative shoe as instructed.   Objective: Significant C-shaped foot is seen. Prominent fifth metatarsal head is noted. Overlying ulcer has opened back up-- it appears to be near capsule with a pink granular base.  There is a palpable bursa present as well.  No redness, streaking or cellulitus present.  Pain has subsided.  Assessment: Ulceration fifth metatarsal head right with bony exostosis and risk for bone infection due to the proximity of the overlying skin to the underlying bone.   Plan: Debrided the ulceration and flushed.  Applied iodosorb and a dressing.  Recommended continued offloading with gauze and dressings and continued use of the surgical shoe. Discussed an MRI to rule out bone infection.  Also discussed the surgery to remove the boney exostosis. She will finish out the antibiotic and I will see her back in 2 weeks for further follow up.

## 2014-02-03 NOTE — Telephone Encounter (Signed)
Spoke with patient at time of incoming call. Patient that she has been having spotting for an extended period of time. Had to have a blood transfusion of two pints on 7/22 due to so much blood loss. Patient has IUD. Denies pain. Patient has uterine cancer. Has had two more infusions of iron since 7/22. Iron was checked on Wednesday and is 12. Had a colonoscopy yesterday and everything was normal. Not actively bleeding. States that she mainly bleeds at night and it has been heavy the last two months. "I just want to know why I am bleeding." Has aex schedule for 9/30. Advised patient will need to be seen sooner for evaluation. Patient states "I don't think it is an emergency but I need to be seen before 9/30." Advised would speak with Dr.Miller and give patient a call back with further recommendations and instructions. Patient agreeable.

## 2014-02-04 LAB — URINALYSIS, MICROSCOPIC ONLY
Bacteria, UA: NONE SEEN
Casts: NONE SEEN
Crystals: NONE SEEN
SQUAMOUS EPITHELIAL / LPF: NONE SEEN

## 2014-02-05 ENCOUNTER — Encounter: Payer: Self-pay | Admitting: Obstetrics & Gynecology

## 2014-02-05 NOTE — Progress Notes (Signed)
Subjective:     Patient ID: [name], female   DOB: 01-27-1956, 58 y.o.   MRN: [id number]  HPI 58 yo G0 SWF here for possible repeat endometrial biopsy.  Pt initially seen last year 01/03/13 for new patient exam. Pt reported spotting that had been off and on for 7 months.  Endometrial biopsy recommended and performed same day.  Biopsy showed FIGO grade 1 endometrial adenocarcinoma in background of simple and complex endometrial hyperplasia.  Endometrial polyp was noted as well.  Pt sent to Dr. Fermin Schwab for consultation.  This was done 01/11/14.  Due to co-morbid conditions, surgery not initially recommended.  MRI was ordered to exclude distant metastatic disease and to evaluate tumor size.  This was done 01/31/14.  Results were:  10 mm endometrial thickness with focal expansion of the anterior junctional zone up to about 11 mm. No evidence for abnormal  periuterine soft tissue. No ascites or pelvic sidewall lymphadenopathy.  Pt felt to be a good candidate for IUD placement.  Dr. Fermin Schwab asked me to place this as this isn't something he normally does.  Mirena IUD placed 02/28/13.  Pt was to have follow up endometrial biopsy with Dr. Fermin Schwab per his note.  This was discussed with pt at IUD placement.  Repeat biopsy has not been done, to date.  When I asked patient about this, she states "no one every called me" but when I asked did she remember this was to be done and that it was important she stated "yes, I knew it was supposed to be done".    Pt has continued to have spotting off and on over the past year.  No heavy bleeding has been present.  Pt has gotten more anemic recently requiring transfusion of 2 units.  Pt has also recently had a colonoscopy (which did not have great prep) and endoscopy.  These were negative.  Virtual colonoscopy now recommended by Dr. Henrene Pastor.  Pt reports having bright red bleeding earlier this week that looked like about 1/4 cup of blood on a pad.  Has been  only spotting since.  No sure if it was vaginal.  No abnormal vaginal odor per pt.  No urinary complaints either but she is not sure about whether there has been blood or no blood in urine.        Review of Systems  All other systems reviewed and are negative.      Objective:   Physical Exam  Vitals reviewed. Constitutional: She is oriented to person, place, and time. She appears well-developed and well-nourished.  Abdominal: Soft. Bowel sounds are normal.  Genitourinary: Vagina normal and uterus normal.    There is no rash, tenderness or lesion on the right labia. There is tenderness and lesion on the left labia. Cervix exhibits no motion tenderness and no discharge. Right adnexum displays no mass and no tenderness. Left adnexum displays no mass and no tenderness. No bleeding around the vagina.  Atrophic changes significant at introitus.  IUD string noted but is about 3cm in length.  This is a little longer than I would expect.  Absolutely NO evidence of blood in vagina or at cervix.  Lymphadenopathy:       Right: No inguinal adenopathy present.       Left: No inguinal adenopathy present.  Neurological: She is alert and oriented to person, place, and time.  Skin: Skin is warm and dry.  Psychiatric: She has a normal mood and affect.   Endometrial biopsy recommended.  Discussed with patient.  Verbal and written consent obtained.   Procedure:  Speculum placed.  Cervix visualized and cleansed with betadine prep.  A single toothed tenaculum was applied to the anterior lip of the cervix.  Endometrial pipelle was advanced through the cervix into the endometrial cavity without difficulty.  Pipelle passed to 9cm.  Suction applied and pipelle removed with good tissue sample obtained.  2 passes performed.  Much less tissue present with biopsy than I expected.  Tenculum removed.  No bleeding noted.  Patient tolerated procedure well.  After biopsy, under sterile conditions, cath u/a obtained to  assess for hematuria.  Urine concentrated but no frank blood noted.     Assessment:     Recent worsening anemia requiring blood transfusion Vaginal bleeding H/O endometrial cancer diagnosed 8/14 with Mirena IUD placement 9/14.  No follow up biopsy done until today.  Pt has not had f/u with gyn/onc either H/O a fib, off Xarelto for now      Plan:     Endometrial biopsy obtained  Cath u/a specimen to be sent for microscopic analysis Depending on pathology results, may need PUS or repeat MRI.  Dr. Henrene Pastor is planning virtual colonoscopy so will try to coordinate testing to get what we both need. Pt absolutely needs follow up with GYN/ONC.  Will plan pending results. Stop black cohosh!

## 2014-02-10 ENCOUNTER — Telehealth: Payer: Self-pay | Admitting: Emergency Medicine

## 2014-02-10 NOTE — Telephone Encounter (Signed)
Spoke with patient.  She is given message from Dr. Sabra Heck.  Patient states she has a urologist already and does have an appointment scheduled for hematuria, patient states she is aware of hematuria.  Advised that Dr. Sabra Heck is awaiting to hear back from Dr. Fermin Schwab. Patient wants to remind Dr. Sabra Heck that she has annual exam on 03/24/14 and wants to ensure that she speaks with Dr. Raynelle Chary prior. Advised I would send her request to Dr. Sabra Heck.   Routing to provider for final review. Patient agreeable to disposition. Will close encounter

## 2014-02-10 NOTE — Telephone Encounter (Signed)
Message copied by Michele Mcalpine on Fri Feb 10, 2014  3:23 PM ------      Message from: Megan Salon      Created: Wed Feb 08, 2014  7:37 AM       Please let pt know pathology showed no abnormal cells!!!  H/o endometrial adenocarcinoma being treated with IUD.  I have emailed Dr. Fermin Schwab about her for additional recommendations and will be back in touch with her.  Her urine test did show blood and this was a cath specimen so I want her to see a urologist.  Has she ever seen one?  If not, will make referral.  Thanks. ------

## 2014-02-12 NOTE — Telephone Encounter (Signed)
Please ask when the urology appt is scheduled and with what doctor, so I can make sure I get records.  She needs to ask him/her to include me on the cc list.   Dr. Everitt Amber, partner of Dr. Fermin Schwab, is here in [place] full time.  She will be taking over Ms. Leas's care.  She let me know on Friday that her office will call to set up an appointment with Ms Taussig as she hasn't seen anyone in gyn/onc for about a year.  Right now, no other scans need to be done.

## 2014-02-13 NOTE — Telephone Encounter (Signed)
Pt returning call

## 2014-02-13 NOTE — Telephone Encounter (Signed)
Message left to return call to [place] at [phone].   Patient has appointment with Dr. Risa Grill 02/16/14 at 1200. Will send office visit notes from Dr. Sabra Heck and cath ua specimen to Dr Cy Blamer attention and request follow up office visits notes.-Patient aware of this appointment and scheduled on her own.  Patient needs message below from Dr. Sabra Heck.

## 2014-02-13 NOTE — Telephone Encounter (Signed)
Spoke with patient. She is advised of message below from Dr. Sabra Heck.  Patient wondering why she needs to see gyn onc. Advised she is to see Dr. Denman George to follow up from Dr. Fermin Schwab and that patient has not been seen for over one year by gyn once. Patient wondering if Mirena needs to come out, advised it can stay in place for up to 5 years, can discuss further with Dr. Sabra Heck at annual exam 03/24/14.   [name]

## 2014-02-15 ENCOUNTER — Other Ambulatory Visit: Payer: Self-pay | Admitting: Family

## 2014-02-16 NOTE — Telephone Encounter (Signed)
Rx called to pharmacy voicemail. 

## 2014-02-16 NOTE — Telephone Encounter (Signed)
OK to send 30 tabs with zero refills.  

## 2014-02-17 ENCOUNTER — Ambulatory Visit (INDEPENDENT_AMBULATORY_CARE_PROVIDER_SITE_OTHER): Payer: BC Managed Care – PPO | Admitting: Podiatrist

## 2014-02-17 ENCOUNTER — Encounter: Payer: Self-pay | Admitting: Podiatrist

## 2014-02-17 VITALS — BP 131/69 | HR 83 | Resp 16

## 2014-02-17 DIAGNOSIS — L97511 Non-pressure chronic ulcer of other part of right foot limited to breakdown of skin: Secondary | ICD-10-CM

## 2014-02-17 DIAGNOSIS — M21619 Bunion of unspecified foot: Secondary | ICD-10-CM

## 2014-02-17 DIAGNOSIS — M21621 Bunionette of right foot: Secondary | ICD-10-CM

## 2014-02-17 DIAGNOSIS — L97509 Non-pressure chronic ulcer of other part of unspecified foot with unspecified severity: Secondary | ICD-10-CM

## 2014-02-17 NOTE — Progress Notes (Signed)
Subjective: Patient presents today for followup of ulceration fifth metatarsal head right foot. She states she finished the antibiotics and has been soaking as instructed.  She relates it is no longer painful. she's been wearing the postoperative shoe as instructed.   Objective: Significant C-shaped foot is seen. Prominent fifth metatarsal head is noted. Very small ulceration continues to be present.  There is a palpable bursa present as well. No redness, streaking or cellulitus present. Pain has subsided.  Due to the location and approximation of the ulcer to the fifth metatarsal head, concern for bone involvement is still present.  Assessment: Ulceration fifth metatarsal head right with bony exostosis and risk for bone infection due to the proximity of the overlying skin to the underlying bone.   Plan: Debrided the ulceration  Applied iodosorb and a dressing. Recommended continued wound care and continued use of the surgical shoe. Consider surgery to reduce the pressure at the fifth metatarsal head and a bone biopsy to rule out osteomyelitis.  We are trying to wait a little longer due to work restrictions.  She will be seen back for a recheck and will call if any recurrence arises.

## 2014-02-20 ENCOUNTER — Encounter: Payer: Self-pay | Admitting: Hematology & Oncology

## 2014-02-20 ENCOUNTER — Ambulatory Visit (HOSPITAL_BASED_OUTPATIENT_CLINIC_OR_DEPARTMENT_OTHER): Payer: BC Managed Care – PPO

## 2014-02-20 ENCOUNTER — Other Ambulatory Visit (HOSPITAL_BASED_OUTPATIENT_CLINIC_OR_DEPARTMENT_OTHER): Payer: BC Managed Care – PPO | Admitting: Lab

## 2014-02-20 ENCOUNTER — Ambulatory Visit (HOSPITAL_BASED_OUTPATIENT_CLINIC_OR_DEPARTMENT_OTHER): Payer: BC Managed Care – PPO | Admitting: Hematology & Oncology

## 2014-02-20 ENCOUNTER — Telehealth: Payer: Self-pay | Admitting: *Deleted

## 2014-02-20 VITALS — BP 119/79 | HR 91 | Temp 98.2°F | Resp 16 | Ht 64.0 in | Wt 311.0 lb

## 2014-02-20 DIAGNOSIS — D509 Iron deficiency anemia, unspecified: Secondary | ICD-10-CM

## 2014-02-20 DIAGNOSIS — K912 Postsurgical malabsorption, not elsewhere classified: Secondary | ICD-10-CM

## 2014-02-20 DIAGNOSIS — D510 Vitamin B12 deficiency anemia due to intrinsic factor deficiency: Secondary | ICD-10-CM

## 2014-02-20 DIAGNOSIS — D508 Other iron deficiency anemias: Secondary | ICD-10-CM

## 2014-02-20 HISTORY — DX: Vitamin B12 deficiency anemia due to intrinsic factor deficiency: D51.0

## 2014-02-20 LAB — IRON AND TIBC CHCC
%SAT: 15 % — AB (ref 21–57)
Iron: 37 ug/dL — ABNORMAL LOW (ref 41–142)
TIBC: 251 ug/dL (ref 236–444)
UIBC: 214 ug/dL (ref 120–384)

## 2014-02-20 LAB — CBC WITH DIFFERENTIAL (CANCER CENTER ONLY)
BASO#: 0.1 10*3/uL (ref 0.0–0.2)
BASO%: 0.5 % (ref 0.0–2.0)
EOS ABS: 0.2 10*3/uL (ref 0.0–0.5)
EOS%: 2 % (ref 0.0–7.0)
HCT: 40.7 % (ref 34.8–46.6)
HEMOGLOBIN: 12.9 g/dL (ref 11.6–15.9)
LYMPH#: 1.6 10*3/uL (ref 0.9–3.3)
LYMPH%: 14.7 % (ref 14.0–48.0)
MCH: 26.7 pg (ref 26.0–34.0)
MCHC: 31.7 g/dL — ABNORMAL LOW (ref 32.0–36.0)
MCV: 84 fL (ref 81–101)
MONO#: 0.6 10*3/uL (ref 0.1–0.9)
MONO%: 5.3 % (ref 0.0–13.0)
NEUT#: 8.4 10*3/uL — ABNORMAL HIGH (ref 1.5–6.5)
NEUT%: 77.5 % (ref 39.6–80.0)
Platelets: 278 10*3/uL (ref 145–400)
RBC: 4.84 10*6/uL (ref 3.70–5.32)
RDW: 18.7 % — ABNORMAL HIGH (ref 11.1–15.7)
WBC: 10.9 10*3/uL — ABNORMAL HIGH (ref 3.9–10.0)

## 2014-02-20 LAB — RETICULOCYTES (CHCC)
ABS Retic: 62.9 10*3/uL (ref 19.0–186.0)
RBC.: 4.84 MIL/uL (ref 3.87–5.11)
RETIC CT PCT: 1.3 % (ref 0.4–2.3)

## 2014-02-20 LAB — FERRITIN CHCC: Ferritin: 810 ng/ml — ABNORMAL HIGH (ref 9–269)

## 2014-02-20 MED ORDER — CYANOCOBALAMIN 1000 MCG/ML IJ SOLN
INTRAMUSCULAR | Status: AC
  Filled 2014-02-20: qty 1

## 2014-02-20 MED ORDER — CYANOCOBALAMIN 1000 MCG/ML IJ SOLN
1000.0000 ug | Freq: Once | INTRAMUSCULAR | Status: AC
  Administered 2014-02-20: 1000 ug via INTRAMUSCULAR

## 2014-02-20 NOTE — Patient Instructions (Signed)

## 2014-02-20 NOTE — Telephone Encounter (Addendum)
Message copied by Lenn Sink on Mon Feb 20, 2014  3:54 PM ------      Message from: Burney Gauze R      Created: Mon Feb 20, 2014  3:07 PM       Call - iron is ok..pete ------Informed pt that iron is good.

## 2014-02-21 NOTE — Progress Notes (Signed)
Hematology and Oncology Follow Up Visit  [name] [id number] 30-Jul-1955 58 y.o. 02/21/2014   Principle Diagnosis:  Iron deficiency anemia  Current Therapy:    IV iron as indicated     Interim History:  [name] is back for a visit. She's doing better. She's feeling better. We saw her back in July. She got iron. We saw her, her iron saturation was only 7%.  She feels better.    She has the gastric bypass. This is causing problems for her we decided on the dorsum.  She had no bleeding. She has had no abdominal pain. There has been no cough. She does have a walking splint on her left foot.  Medications: Current outpatient prescriptions:albuterol (PROVENTIL) (2.5 MG/3ML) 0.083% nebulizer solution, Take 2.5 mg by nebulization every 6 (six) hours as needed for wheezing or shortness of breath., Disp: , Rfl: ;  Ascorbic Acid (VITAMIN C CR) 1000 MG TBCR, Take 1 tablet by mouth 2 (two) times a week. On Tuesday and Thursday., Disp: , Rfl: ;  Black Cohosh 40 MG CAPS, Take 1 capsule by mouth daily. , Disp: , Rfl:  budesonide-formoterol (SYMBICORT) 160-4.5 MCG/ACT inhaler, Inhale 2 puffs into the lungs 2 (two) times daily., Disp: , Rfl: ;  calcitRIOL (ROCALTROL) 0.5 MCG capsule, Take 0.411 mcg by mouth 2 (two) times daily. , Disp: , Rfl: ;  calcium carbonate (TUMS - DOSED IN MG ELEMENTAL CALCIUM) 500 MG chewable tablet, Chew 1 tablet by mouth daily. , Disp: , Rfl: ;  cyclobenzaprine (FLEXERIL) 10 MG tablet, Take 10 mg by mouth daily., Disp: , Rfl:  diltiazem (CARDIZEM CD) 240 MG 24 hr capsule, Take 240 mg by mouth daily., Disp: , Rfl: ;  ergocalciferol (VITAMIN D2) 50000 UNITS capsule, Take 50,000 Units by mouth 2 (two) times a week. On Saturday and Wednesday, Disp: , Rfl: ;  fluticasone (FLONASE) 50 MCG/ACT nasal spray, Place 2 sprays into both nostrils daily., Disp: , Rfl: ;  furosemide (LASIX) 40 MG tablet, Take 40 mg by mouth daily., Disp: , Rfl:  hydrochlorothiazide (HYDRODIURIL) 25 MG  tablet, Take 25 mg by mouth daily., Disp: , Rfl: ;  HYDROcodone-acetaminophen (NORCO/VICODIN) 5-325 MG per tablet, Take 1 tablet by mouth every 6 (six) hours as needed for moderate pain. , Disp: , Rfl: ;  iron polysaccharides (NU-IRON) 150 MG capsule, Take 150 mg by mouth daily. , Disp: , Rfl: ;  loratadine (CLARITIN) 10 MG tablet, Take 10 mg by mouth daily., Disp: , Rfl:  methocarbamol (ROBAXIN) 500 MG tablet, Take 500 mg by mouth 2 (two) times daily. , Disp: , Rfl: ;  nystatin cream (MYCOSTATIN), Apply 1 application topically 2 (two) times daily., Disp: , Rfl: ;  omeprazole (PRILOSEC) 40 MG capsule, Take 40 mg by mouth daily., Disp: , Rfl: ;  Potassium Citrate 15 MEQ (1620 MG) TBCR, Take by mouth 2 (two) times daily., Disp: , Rfl:  traMADol (ULTRAM) 50 MG tablet, TAKE 1 TABLET BY MOUTH EVERY 6 HOURS AS NEEDED, Disp: 30 tablet, Rfl: 0;  UNABLE TO FIND, MANGANESE 60MG  TAKE ONE PILL WITH EACH MEAL THREE TIMES DAILY, Disp: , Rfl: ;  valsartan (DIOVAN) 80 MG tablet, Take 80 mg by mouth daily., Disp: , Rfl: ;  vitamin B-12 (CYANOCOBALAMIN) 1000 MCG tablet, Take 1,000 mcg by mouth 2 (two) times a week. On Tuesday and Thursday with Iron., Disp: , Rfl:  vitamin E (VITAMIN E) 1000 UNIT capsule, Take 1 two times a week., Disp: , Rfl: ;  zafirlukast (ACCOLATE) 20 MG tablet, Take 20 mg by mouth 2 (two) times daily before a meal., Disp: , Rfl: ;  ciprofloxacin (CIPRO) 500 MG tablet, Take 500 mg by mouth 2 (two) times daily., Disp: , Rfl: ;  clindamycin (CLEOCIN) 300 MG capsule, Take 300 mg by mouth 3 (three) times daily., Disp: , Rfl:  levothyroxine (SYNTHROID, LEVOTHROID) 137 MCG tablet, Take 411 mcg by mouth daily. Takes 3 tabs in AM, Disp: , Rfl:   Allergies:  Allergies  Allergen Reactions  . Other Anaphylaxis    NUTS  . Amoxicillin-Pot Clavulanate Other (See Comments)    headache  . Penicillins Other (See Comments)    headache    Past Medical History, Surgical history, Social history, and Family History  were reviewed and updated.  Review of Systems: As above  Physical Exam:  height is 5\' 4"  (1.626 m) and weight is 311 lb (141.069 kg). Her oral temperature is 98.2 F (36.8 C). Her blood pressure is 119/79 and her pulse is 91. Her respiration is 16.   Obese white female no obvious distress. Head and exam has no ocular or oral lesions. There are no palpable cervical or supraclavicular lymph nodes. Lungs are clear. Cardiac exam regular in rhythm with no murmurs, rubs or bruits. Abdomen is soft. She has good bowel sounds. There is no fluid wave. There is no palpable liver or spleen tip. Extremities shows no clubbing, cyanosis or edema. She has a walking splint on the left foot. Skin exam no rashes, ecchymosis or petechia.  Lab Results  Component Value Date   WBC 10.9* 02/20/2014   HGB 12.9 02/20/2014   HCT 40.7 02/20/2014   MCV 84 02/20/2014   PLT 278 Platelet count confirmed by slide estimate 02/20/2014     Chemistry      Component Value Date/Time   NA 141 11/23/2013 0859   K 3.7 11/23/2013 0859   CL 102 11/23/2013 0859   CO2 28 11/23/2013 0859   BUN 18 11/23/2013 0859   CREATININE 0.80 11/23/2013 0859   CREATININE 0.69 02/16/2013 1420      Component Value Date/Time   CALCIUM 8.2* 11/23/2013 0859   CALCIUM 8.0* 05/27/2010 1855   ALKPHOS 98 06/12/2010 1356   AST 23 06/12/2010 1356   ALT 27 06/12/2010 1356   BILITOT 0.3 06/12/2010 1356      Ferritin is 110. Iron saturation is 15%. Total iron is 37.   Impression and Plan: [name] is 58 year old white female. She's had a history of gastric bypass. She is improving with respect to her anemia. She's not anemic. Her and to come up quite nicely.  A very picas watch her for right now. I don't think she needs any iron. She feels pretty good.  We will we will plan to see her back in about 6 weeks.   Volanda Napoleon, MD 9/29/201510:26 AM

## 2014-02-24 ENCOUNTER — Ambulatory Visit (INDEPENDENT_AMBULATORY_CARE_PROVIDER_SITE_OTHER): Payer: BC Managed Care – PPO

## 2014-02-24 DIAGNOSIS — Z23 Encounter for immunization: Secondary | ICD-10-CM

## 2014-02-24 NOTE — Progress Notes (Signed)
Pt tolerated injection well.  No signs of reaction upon leaving the clinic.   

## 2014-02-24 NOTE — Progress Notes (Signed)
Pre visit review using our clinic review tool, if applicable. No additional management support is needed unless otherwise documented below in the visit note. 

## 2014-02-27 ENCOUNTER — Other Ambulatory Visit: Payer: BC Managed Care – PPO

## 2014-03-06 ENCOUNTER — Other Ambulatory Visit: Payer: Self-pay | Admitting: Family

## 2014-03-06 ENCOUNTER — Other Ambulatory Visit: Payer: Self-pay | Admitting: Cardiology

## 2014-03-06 NOTE — Telephone Encounter (Signed)
Rx request to pharmacy/SLS  

## 2014-03-10 ENCOUNTER — Ambulatory Visit (INDEPENDENT_AMBULATORY_CARE_PROVIDER_SITE_OTHER): Payer: BC Managed Care – PPO | Admitting: Podiatrist

## 2014-03-10 ENCOUNTER — Encounter: Payer: Self-pay | Admitting: Podiatrist

## 2014-03-10 VITALS — BP 147/74 | HR 79 | Resp 16

## 2014-03-10 DIAGNOSIS — L97511 Non-pressure chronic ulcer of other part of right foot limited to breakdown of skin: Secondary | ICD-10-CM

## 2014-03-10 MED ORDER — CIPROFLOXACIN HCL 500 MG PO TABS: 500.0000 mg | ORAL_TABLET | Freq: Two times a day (BID) | ORAL | Status: DC

## 2014-03-10 MED ORDER — HYDROCODONE-ACETAMINOPHEN 5-325 MG PO TABS: 1.0000 | ORAL_TABLET | ORAL | Status: DC | PRN

## 2014-03-10 NOTE — Patient Instructions (Signed)
Continue with epsom salt soaks.  Keep pushing out the drainage as best as you can.  I called in the antibiotic for you.

## 2014-03-13 LAB — WOUND CULTURE
Gram Stain: NONE SEEN
Gram Stain: NONE SEEN

## 2014-03-14 NOTE — Progress Notes (Signed)
Subjective: Patient presents today for followup of ulceration fifth metatarsal head right foot. She has been soaking the foot in Epsom salts soaks and has noticed that the area has mostly closed however she does get some drainage after soaking. she's been wearing the postoperative shoe as instructed.   Objective: Significant C-shaped foot is seen. Prominent fifth metatarsal head is noted. Very small ulceration continues to be present. Pus is expressed at today's visit which is cultured. There is a palpable bursa present as well. No redness, streaking or cellulitus present. Pain has subsided. Due to the location and approximation of the ulcer to the fifth metatarsal head, concern for bone involvement is still present.   Assessment: Ulceration fifth metatarsal head right with bony exostosis and risk for bone infection due to the proximity of the overlying skin to the underlying bone.   Plan: Debrided the ulceration to the culture. Applied iodosorb and a dressing. Recommended continued wound care and continued use of the surgical shoe. Again asked her to consider surgery to reduce the pressure at the fifth metatarsal head and a bone biopsy to rule out osteomyelitis.  Will recheck in 2-3 weeks.

## 2014-03-21 ENCOUNTER — Other Ambulatory Visit: Payer: Self-pay | Admitting: Family

## 2014-03-21 NOTE — Telephone Encounter (Signed)
Pt next f/u 06/12/14.  Rx printed and forwarded to Provider for signature.  Medication name:  Name from pharmacy:  traMADol (ULTRAM) 50 MG tablet  TRAMADOL 50MG  TABLETS Sig: TAKE 1 TABLET BY MOUTH EVERY 6 HOURS AS NEEDED Dispense: 30 tablet Refills: 0 Start: 03/21/2014 Class: Normal Requested on: 03/21/2014 Originally ordered on: 02/02/2014 Last refill: 02/16/2014

## 2014-03-21 NOTE — Telephone Encounter (Signed)
Rx faxed at 1:45pm.

## 2014-03-24 ENCOUNTER — Ambulatory Visit: Payer: BC Managed Care – PPO | Admitting: Obstetrics & Gynecology

## 2014-03-31 ENCOUNTER — Encounter: Payer: Self-pay | Admitting: Podiatrist

## 2014-03-31 ENCOUNTER — Ambulatory Visit: Payer: BC Managed Care – PPO | Admitting: Podiatrist

## 2014-03-31 ENCOUNTER — Ambulatory Visit (INDEPENDENT_AMBULATORY_CARE_PROVIDER_SITE_OTHER): Payer: BC Managed Care – PPO | Admitting: Podiatrist

## 2014-03-31 VITALS — BP 130/71 | HR 81 | Resp 16

## 2014-03-31 DIAGNOSIS — L97511 Non-pressure chronic ulcer of other part of right foot limited to breakdown of skin: Secondary | ICD-10-CM

## 2014-03-31 MED ORDER — CIPROFLOXACIN HCL 500 MG PO TABS
500.0000 mg | ORAL_TABLET | Freq: Two times a day (BID) | ORAL | Status: DC
Start: 2014-03-31 — End: 2014-04-17

## 2014-03-31 MED ORDER — CLINDAMYCIN HCL 300 MG PO CAPS: 300.0000 mg | ORAL_CAPSULE | Freq: Three times a day (TID) | ORAL | Status: DC

## 2014-03-31 NOTE — Progress Notes (Signed)
Subjective: Patient presents today for followup of ulceration fifth metatarsal head right foot. She has been soaking the foot in Epsom salts soaks and has noticed that the area has mostly closed however is still getting some drainage after soaking which she states is puslike. she's been wearing the postoperative shoe as instructed. she also states that she is ready to have the surgery done and she wants to have it done after Thanksgiving.  Objective: Significant C-shaped foot is seen. Prominent fifth metatarsal head is noted. Very small ulceration continues to be present. No Pus is expressed at today's visit.  Mild swelling is present on the dorsal lateral aspect of the right foot. No redness around the periwound area is noted.There is a palpable bursa present as well. No redness, no streaking  present. Pain has subsided. Due to the location and approximation of the ulcer to the fifth metatarsal head, concern for bone involvement is still present. Cultured Moderate VIRIDANS STREPTOCOCCUS  Assessment: Ulceration fifth metatarsal head right with bony exostosis and risk for bone infection due to the proximity of the overlying skin to the underlying bone.   Plan: Debrided the ulceration  Applied iodosorb and a dressing. Discussed the surgery in particular which would include shaving of the bone and removal of any part that may be infected if this is the case.  At the surgery a biopsy of the bone will also be taken to rule out osteomyelitis. I filled out the consent forms however she did not want to sign them at today's visit. I recommended at least going ahead and choosing a date as I am starting to fill up with my surgery schedule and she elected to wait still.  I read her prescriptive for clindamycin. I will see her back in 2-3 weeks for another recheck the left she decides to book her surgery prior to that visit. Marland KitchenRecommended continued wound care and continued use of the surgical shoe.

## 2014-03-31 NOTE — Patient Instructions (Signed)
Pre-Operative Instructions  Congratulations, you have decided to take an important step to improving your quality of life.  You can be assured that the doctors of [facility] will be with you every step of the way.  1. Plan to be at the surgery center/hospital at least 1 (one) hour prior to your scheduled time unless otherwise directed by the surgical center/hospital staff.  You must have a responsible adult accompany you, remain during the surgery and drive you home.  Make sure you have directions to the surgical center/hospital and know how to get there on time. 2. For hospital based surgery you will need to obtain a history and physical form from your family physician within 1 month prior to the date of surgery- we will give you a form for you primary physician.  3. We make every effort to accommodate the date you request for surgery.  There are however, times where surgery dates or times have to be moved.  We will contact you as soon as possible if a change in schedule is required.   4. No Aspirin/Ibuprofen for one week before surgery.  If you are on aspirin, any non-steroidal anti-inflammatory medications (Mobic, Aleve, Ibuprofen) you should stop taking it 7 days prior to your surgery.  You make take Tylenol  For pain prior to surgery.  5. Medications- If you are taking daily heart and blood pressure medications, seizure, reflux, allergy, asthma, anxiety, pain or diabetes medications, make sure the surgery center/hospital is aware before the day of surgery so they may notify you which medications to take or avoid the day of surgery. 6. No food or drink after midnight the night before surgery unless directed otherwise by surgical center/hospital staff. 7. No alcoholic beverages 24 hours prior to surgery.  No smoking 24 hours prior to or 24 hours after surgery. 8. Wear loose pants or shorts- loose enough to fit over bandages, boots, and casts. 9. No slip on shoes, sneakers are best. 10. Bring  your boot with you to the surgery center/hospital.  Also bring crutches or a walker if your physician has prescribed it for you.  If you do not have this equipment, it will be provided for you after surgery. 11. If you have not been contracted by the surgery center/hospital by the day before your surgery, call to confirm the date and time of your surgery. 12. Leave-time from work may vary depending on the type of surgery you have.  Appropriate arrangements should be made prior to surgery with your employer. 13. Prescriptions will be provided immediately following surgery by your doctor.  Have these filled as soon as possible after surgery and take the medication as directed. 14. Remove nail polish on the operative foot. 15. Wash the night before surgery.  The night before surgery wash the foot and leg well with the antibacterial soap provided and water paying special attention to beneath the toenails and in between the toes.  Rinse thoroughly with water and dry well with a towel.  Perform this wash unless told not to do so by your physician.  Enclosed: 1 Ice pack (please put in freezer the night before surgery)   1 Hibiclens skin cleaner   Pre-op Instructions  If you have any questions regarding the instructions, do not hesitate to call our office.  [place]: [address] [place], [place] [address] [phone]  [place]: [address] [place], [place] [address] [phone]  [place]: [address]  , [place] [address] [phone]  Dr. Richard   Tuchman DPM, Dr. Norman Regal DPM Dr. Richard Sikora DPM, Dr. M. Todd Hyatt DPM, Dr. Kathryn Egerton DPM 

## 2014-04-01 ENCOUNTER — Other Ambulatory Visit: Payer: Self-pay | Admitting: Family

## 2014-04-11 ENCOUNTER — Telehealth: Payer: Self-pay | Admitting: *Deleted

## 2014-04-11 MED ORDER — HYDROCHLOROTHIAZIDE 25 MG PO TABS: 25.0000 mg | ORAL_TABLET | Freq: Every day | ORAL | Status: DC

## 2014-04-11 MED ORDER — FUROSEMIDE 40 MG PO TABS: 40.0000 mg | ORAL_TABLET | Freq: Every day | ORAL | Status: DC

## 2014-04-11 MED ORDER — ZAFIRLUKAST 20 MG PO TABS: 20.0000 mg | ORAL_TABLET | Freq: Two times a day (BID) | ORAL | Status: DC

## 2014-04-11 MED ORDER — VALSARTAN 80 MG PO TABS
ORAL_TABLET | ORAL | Status: DC
Start: 2014-04-11 — End: 2014-04-14

## 2014-04-11 NOTE — Telephone Encounter (Signed)
Received fax from [facility] for refills of:  Valsartan 80mg , zafirlukast 20mg , furosemide 40mg , HCTZ 25mg .  Pt has f/u 06/12/14, refills sent.

## 2014-04-13 ENCOUNTER — Other Ambulatory Visit: Payer: Self-pay | Admitting: Family

## 2014-04-17 ENCOUNTER — Encounter: Payer: Self-pay | Admitting: Hematology & Oncology

## 2014-04-17 ENCOUNTER — Ambulatory Visit (HOSPITAL_BASED_OUTPATIENT_CLINIC_OR_DEPARTMENT_OTHER): Payer: BC Managed Care – PPO | Admitting: Hematology & Oncology

## 2014-04-17 ENCOUNTER — Ambulatory Visit (HOSPITAL_BASED_OUTPATIENT_CLINIC_OR_DEPARTMENT_OTHER): Payer: BC Managed Care – PPO | Admitting: Lab

## 2014-04-17 ENCOUNTER — Ambulatory Visit: Payer: BC Managed Care – PPO

## 2014-04-17 DIAGNOSIS — D509 Iron deficiency anemia, unspecified: Secondary | ICD-10-CM

## 2014-04-17 DIAGNOSIS — D510 Vitamin B12 deficiency anemia due to intrinsic factor deficiency: Secondary | ICD-10-CM

## 2014-04-17 DIAGNOSIS — Z9884 Bariatric surgery status: Secondary | ICD-10-CM

## 2014-04-17 LAB — CBC WITH DIFFERENTIAL (CANCER CENTER ONLY)
BASO#: 0 10*3/uL (ref 0.0–0.2)
BASO%: 0.2 % (ref 0.0–2.0)
EOS%: 1.6 % (ref 0.0–7.0)
Eosinophils Absolute: 0.2 10*3/uL (ref 0.0–0.5)
HCT: 40.9 % (ref 34.8–46.6)
HGB: 13.4 g/dL (ref 11.6–15.9)
LYMPH#: 1.9 10*3/uL (ref 0.9–3.3)
LYMPH%: 16.2 % (ref 14.0–48.0)
MCH: 27.2 pg (ref 26.0–34.0)
MCHC: 32.8 g/dL (ref 32.0–36.0)
MCV: 83 fL (ref 81–101)
MONO#: 0.5 10*3/uL (ref 0.1–0.9)
MONO%: 4.1 % (ref 0.0–13.0)
NEUT#: 9 10*3/uL — ABNORMAL HIGH (ref 1.5–6.5)
NEUT%: 77.9 % (ref 39.6–80.0)
PLATELETS: 193 10*3/uL (ref 145–400)
RBC: 4.93 10*6/uL (ref 3.70–5.32)
RDW: 15.1 % (ref 11.1–15.7)
WBC: 11.6 10*3/uL — ABNORMAL HIGH (ref 3.9–10.0)

## 2014-04-17 LAB — IRON AND TIBC CHCC
%SAT: 13 % — ABNORMAL LOW (ref 21–57)
Iron: 38 ug/dL — ABNORMAL LOW (ref 41–142)
TIBC: 290 ug/dL (ref 236–444)
UIBC: 252 ug/dL (ref 120–384)

## 2014-04-17 LAB — FERRITIN CHCC: FERRITIN: 601 ng/mL — AB (ref 9–269)

## 2014-04-17 LAB — CHCC SATELLITE - SMEAR

## 2014-04-17 NOTE — Progress Notes (Signed)
No treatment needed today per NP.

## 2014-04-17 NOTE — Progress Notes (Signed)
[place]  Telephone:([phone]) [phone] Fax:(336) [phone]  ID: [name] OB: [birth year] MR#: [id number] CSN#:[id number] Patient Care Team: Debbrah Alar, NP as PCP - General (Internal Medicine) Deneise Lever, MD (Pulmonary Disease) Lelon Perla, MD (Cardiology) Delrae Rend, MD (Internal Medicine)  DIAGNOSIS:  Iron deficiency anemia Gastric bypass in 1974  INTERVAL HISTORY: [name] is here today for a follow-up. She is feeling tired today and is having a lot of pain in her feet and ankles. She is seeing an orthopedist about her feet and has another appointment this week. She last had iron in September and did well with it.  Her gynecologist did an endometrial biopsy in October and it came back negative for cancer. She will follow-up with them in a year.  Her appetite is good and she is drinking plenty of fluids..  She denies fever, chills, n/v, cough, rash, headache, dizziness, SOB, chest pain, palpitations, abdominal pain, constipation, diarrhea, blood in urine or stool.  No swelling, numbness or tingling in her extremities.   CURRENT TREATMENT: IV iron infusions as needed  REVIEW OF SYSTEMS: All other 10 point review of systems is negative except for those issues mentioned above.   PAST MEDICAL HISTORY: Past Medical History  Diagnosis Date  . Allergic rhinitis   . Asthma   . Hypertension   . Hypothyroidism   . Anemia, iron deficiency   . Vitamin D deficiency   . Morbid obesity   . GERD (gastroesophageal reflux disease)   . Hypoparathyroidism 01/07/2011  . Arthritis   . Fatty liver 01/07/2012  . Headache(784.0)     occasional sinus headache   . Atrial fibrillation August 2012    OFF XARELTO LAST MONTH DUE TO BLEEDING IN STOOL  . History of blood transfusion JULY 2015  . Fibroid 1974    fibroid cyst on left fallopian tube  . Abnormal Pap smear     years ago/no biopsy  . Bursitis of hip left  . Edema of both legs   . Enlarged liver   .  Iron deficiency anemia   . Foot ulcer     AREA HEALED RIGHT FOOT  . Seizures     infancy secondary to fever  . Nephrolithiasis     SEES DR Risa Grill  . Thyroid cancer 2011    THYROIDECTOMY DONE  . Uterine cancer DX 2014    NO CURRENT TX FOR  . PONV (postoperative nausea and vomiting)   . Pernicious anemia 02/20/2014   PAST SURGICAL HISTORY: Past Surgical History  Procedure Laterality Date  . Tonsillectomy and adenoidectomy    . Bunionectomy      bilateral  . Appendectomy    . Gastric bypass  1974  . Thyroidectomy  05/15/2010  . Cyst on ovary removed     . Ureteroscopy  03/03/2012    Procedure: URETEROSCOPY;  Surgeon: Bernestine Amass, MD;  Location: [facility];  Service: Urology;  Laterality: Left;  . Cystoscopy w/ retrogrades  03/03/2012    Procedure: CYSTOSCOPY WITH RETROGRADE PYELOGRAM;  Surgeon: Bernestine Amass, MD;  Location: [facility];  Service: Urology;  Laterality: Bilateral;  . Lithotripsy  03/2012  . Cystoscopy with retrograde pyelogram, ureteroscopy and stent placement Left 08/25/2012    Procedure: CYSTOSCOPY WITH RETROGRADE PYELOGRAM, URETEROSCOPY;  Surgeon: Bernestine Amass, MD;  Location: [facility];  Service: Urology;  Laterality: Left;  . Holmium laser application Left 07/01/3333    Procedure: HOLMIUM LASER APPLICATION;  Surgeon: Bernestine Amass, MD;  Location: [facility];  Service: Urology;  Laterality: Left;  . Dilation and curettage of uterus N/A 02/21/2013    Procedure: DILATATION AND CURETTAGE;  Surgeon: Lyman Speller, MD;  Location: [facility];  Service: Gynecology;  Laterality: N/A;  . Colonoscopy with propofol N/A 02/02/2014    Procedure: COLONOSCOPY WITH PROPOFOL;  Surgeon: Irene Shipper, MD;  Location: [facility];  Service: Endoscopy;  Laterality: N/A;  . Esophagogastroduodenoscopy (egd) with propofol N/A 02/02/2014    Procedure: ESOPHAGOGASTRODUODENOSCOPY (EGD) WITH PROPOFOL;  Surgeon: Irene Shipper, MD;  Location: [facility];  Service: Endoscopy;  Laterality: N/A;   FAMILY  HISTORY Family History  Problem Relation Age of Onset  . Hypertension Father   . Diabetes Father   . Lung cancer Father   . Heart attack Father     MI at age 34  . Hypertension Mother   . Hyperthyroidism Mother   . Heart disease Mother   . Heart attack Mother     MI at age 77  . Asthma Brother   . Hypertension Brother     younger  . Heart disease Brother     older  . Colon cancer Neg Hx   . Esophageal cancer Neg Hx   . Stomach cancer Neg Hx   . Kidney disease Neg Hx   . Liver disease Neg Hx    GYNECOLOGIC HISTORY:  Patient's last menstrual period was 05/26/2009.   SOCIAL HISTORY:  History   Social History  . Marital Status: Single    Spouse Name: N/A    Number of Children: 0  . Years of Education: N/A   Occupational History  . works in Insurance claims handler   . [employer]    Social History Main Topics  . Smoking status: Former Smoker -- 0.50 packs/day for 25 years    Types: Cigarettes    Start date: 12/24/1970    Quit date: 05/27/1995  . Smokeless tobacco: Never Used     Comment: quit smoking 17 years ago  . Alcohol Use: Yes     Comment: 1/2 glass per month  . Drug Use: No  . Sexual Activity:    Partners: Male   Other Topics Concern  . Not on file   Social History Narrative   Occupation: works in Insurance claims handler - Field seismologist   Single       Former Smoker - quit tobacco 12 years ago.  She was light smoker for 10 years.                ADVANCED DIRECTIVES: <no information>  HEALTH MAINTENANCE: History  Substance Use Topics  . Smoking status: Former Smoker -- 0.50 packs/day for 25 years    Types: Cigarettes    Start date: 12/24/1970    Quit date: 05/27/1995  . Smokeless tobacco: Never Used     Comment: quit smoking 17 years ago  . Alcohol Use: Yes     Comment: 1/2 glass per month   Colonoscopy: PAP: Bone density: Lipid panel:  Allergies  Allergen Reactions  . Other Anaphylaxis    NUTS  . Amoxicillin-Pot Clavulanate  Other (See Comments)    headache  . Penicillins Other (See Comments)    headache    Current Outpatient Prescriptions  Medication Sig Dispense Refill  . albuterol (PROVENTIL) (2.5 MG/3ML) 0.083% nebulizer solution Take 2.5 mg by nebulization every 6 (six) hours as needed for wheezing or shortness of breath.    . Ascorbic Acid (VITAMIN C CR)  1000 MG TBCR Take 1 tablet by mouth 2 (two) times a week. On Tuesday and Thursday.    . Black Cohosh 40 MG CAPS Take 1 capsule by mouth daily.     . calcitRIOL (ROCALTROL) 0.5 MCG capsule Take 0.411 mcg by mouth 2 (two) times daily.     . calcium carbonate (TUMS - DOSED IN MG ELEMENTAL CALCIUM) 500 MG chewable tablet Chew 1 tablet by mouth daily.     [name] CARTIA XT 240 MG 24 hr capsule TAKE ONE CAPSULE BY MOUTH DAILY 90 capsule 3  . diltiazem (CARDIZEM CD) 240 MG 24 hr capsule Take 240 mg by mouth daily.    . ergocalciferol (VITAMIN D2) 50000 UNITS capsule Take 50,000 Units by mouth 2 (two) times a week. On Saturday and Wednesday    . fluticasone (FLONASE) 50 MCG/ACT nasal spray Place 2 sprays into both nostrils daily.    . furosemide (LASIX) 40 MG tablet Take 1 tablet (40 mg total) by mouth daily. 90 tablet 1  . hydrochlorothiazide (HYDRODIURIL) 25 MG tablet Take 1 tablet (25 mg total) by mouth daily. 90 tablet 1  . iron polysaccharides (NU-IRON) 150 MG capsule Take 150 mg by mouth daily.     [name] levothyroxine (SYNTHROID, LEVOTHROID) 137 MCG tablet Take 411 mcg by mouth daily. Takes 3 tabs in AM    . loratadine (CLARITIN) 10 MG tablet Take 10 mg by mouth daily.    [name] nystatin cream (MYCOSTATIN) Apply 1 application topically 2 (two) times daily.    [name] omeprazole (PRILOSEC) 40 MG capsule Take 40 mg by mouth daily.    . Potassium Citrate 15 MEQ (1620 MG) TBCR Take by mouth 2 (two) times daily.    . SYMBICORT 160-4.5 MCG/ACT inhaler INHALE 2 PUFFS BY MOUTH TWICE DAILY 10.2 g 2  . traMADol (ULTRAM) 50 MG tablet TAKE 1 TABLET BY MOUTH EVERY 6 HOURS AS NEEDED 30  tablet 0  . UNABLE TO FIND MANGANESE 60MG  TAKE ONE PILL WITH EACH MEAL THREE TIMES DAILY    . valsartan (DIOVAN) 80 MG tablet TAKE 1 TABLET BY MOUTH EVERY DAY 30 tablet 0  . vitamin B-12 (CYANOCOBALAMIN) 1000 MCG tablet Take 1,000 mcg by mouth 2 (two) times a week. On Tuesday and Thursday with Iron.    . vitamin E (VITAMIN E) 1000 UNIT capsule Take 1 two times a week.    . zafirlukast (ACCOLATE) 20 MG tablet Take 1 tablet (20 mg total) by mouth 2 (two) times daily before a meal. 180 tablet 1   No current facility-administered medications for this visit.   OBJECTIVE: Filed Vitals:   04/17/14 0927  BP: 151/81  Pulse: 85  Temp: 98.3 F (36.8 C)  Resp: 16   Body mass index is 53.36 kg/(m^2). ECOG FS:1 - Symptomatic but completely ambulatory Ocular: Sclerae unicteric, pupils equal, round and reactive to light Ear-nose-throat: Oropharynx clear, dentition fair Lymphatic: No cervical or supraclavicular adenopathy Lungs no rales or rhonchi, good excursion bilaterally Heart regular rate and rhythm, no murmur appreciated Abd soft, nontender, positive bowel sounds MSK no focal spinal tenderness, no joint edema Neuro: non-focal, well-oriented, appropriate affect Breasts: Deferred  LAB RESULTS: CMP     Component Value Date/Time   NA 141 11/23/2013 0859   K 3.7 11/23/2013 0859   CL 102 11/23/2013 0859   CO2 28 11/23/2013 0859   GLUCOSE 101* 11/23/2013 0859   BUN 18 11/23/2013 0859   CREATININE 0.80 11/23/2013 0859   CREATININE 0.69 02/16/2013 1420  CALCIUM 8.2* 11/23/2013 0859   CALCIUM 8.0* 05/27/2010 1855   PROT 7.2 06/12/2010 1356   ALBUMIN 4.1 06/12/2010 1356   AST 23 06/12/2010 1356   ALT 27 06/12/2010 1356   ALKPHOS 98 06/12/2010 1356   BILITOT 0.3 06/12/2010 1356   GFRNONAA 83 11/23/2013 0859   GFRNONAA >90 02/16/2013 1420   GFRAA >89 11/23/2013 0859   GFRAA >90 02/16/2013 1420   No results found for: SPEP Lab Results  Component Value Date   WBC 11.6* 04/17/2014    NEUTROABS 9.0* 04/17/2014   HGB 13.4 04/17/2014   HCT 40.9 04/17/2014   MCV 83 04/17/2014   PLT 193 04/17/2014   No results found for: LABCA2 No components found for: FRTMY111 No results for input(s): INR in the last 168 hours.  STUDIES: No results found.  ASSESSMENT/PLAN: Ms. Kittel is a very pleasant 58 yo female with iron deficiency anemia. She had a gastric bypass in 1974.  She did well with the iron infusion and is holding steady with a Hgb of 13.4. She is feeling tired today. We will wait and see what her iron studies show. We will see her back in 4 months for labs and a follow-up.  All questions were answered and she is in agreement with the plan. She knows to call here with any questions or concerns and to go to the ED in the event of an emergency. We can certainly see her sooner if need be.   Eliezer Bottom, NP 04/17/2014 9:55 AM

## 2014-04-18 ENCOUNTER — Telehealth: Payer: Self-pay | Admitting: Hematology & Oncology

## 2014-04-18 ENCOUNTER — Telehealth: Payer: Self-pay | Admitting: *Deleted

## 2014-04-18 NOTE — Telephone Encounter (Addendum)
Notified patient and scheduler.  ----- Message from Volanda Napoleon, MD sent at 04/17/2014  6:19 PM EST ----- Call and let her know that the iron level is actually low. She needs a dose of Feraheme at 1020 mg IV 1 dose. [name]

## 2014-04-18 NOTE — Telephone Encounter (Signed)
Left pt message to call for appointment we need to schedule in the next couple weeks

## 2014-04-19 ENCOUNTER — Other Ambulatory Visit: Payer: Self-pay | Admitting: Family

## 2014-04-19 ENCOUNTER — Other Ambulatory Visit: Payer: Self-pay | Admitting: *Deleted

## 2014-04-19 MED ORDER — DILTIAZEM HCL ER COATED BEADS 240 MG PO CP24: 240.0000 mg | ORAL_CAPSULE | Freq: Every day | ORAL | Status: DC

## 2014-04-19 NOTE — Telephone Encounter (Signed)
Tramadol 50mg , #30 x no refills called to pharmacy voicemail. HCTZ refill sent to mail order on 04/11/14, #90 x 1 refill. Left message for pt to call and verify if she is needing local rx to [facility]?

## 2014-04-19 NOTE — Telephone Encounter (Signed)
Pt returned my call stating she has small supply of HCTZ left and will call us after the holidays if she hasn't received her mail order supply.

## 2014-04-21 ENCOUNTER — Ambulatory Visit (HOSPITAL_BASED_OUTPATIENT_CLINIC_OR_DEPARTMENT_OTHER): Payer: BC Managed Care – PPO

## 2014-04-21 ENCOUNTER — Other Ambulatory Visit: Payer: Self-pay | Admitting: *Deleted

## 2014-04-21 VITALS — BP 142/70 | HR 80 | Temp 97.9°F | Resp 16

## 2014-04-21 DIAGNOSIS — D510 Vitamin B12 deficiency anemia due to intrinsic factor deficiency: Secondary | ICD-10-CM

## 2014-04-21 DIAGNOSIS — D509 Iron deficiency anemia, unspecified: Secondary | ICD-10-CM

## 2014-04-21 LAB — ANTI-PARIETAL ANTIBODY: Parietal Cell Antibody-IgG: NEGATIVE

## 2014-04-21 LAB — RETICULOCYTES (CHCC)
ABS RETIC: 80.3 10*3/uL (ref 19.0–186.0)
RBC.: 5.02 MIL/uL (ref 3.87–5.11)
Retic Ct Pct: 1.6 % (ref 0.4–2.3)

## 2014-04-21 LAB — INTRINSIC FACTOR ANTIBODIES: Intrinsic Factor: NEGATIVE

## 2014-04-21 MED ORDER — SODIUM CHLORIDE 0.9 % IV SOLN
1020.0000 mg | Freq: Once | INTRAVENOUS | Status: AC
  Administered 2014-04-21: 1020 mg via INTRAVENOUS
  Filled 2014-04-21: qty 34

## 2014-04-21 MED ORDER — SODIUM CHLORIDE 0.9 % IV SOLN
INTRAVENOUS | Status: DC
  Administered 2014-04-21: 14:00:00 via INTRAVENOUS

## 2014-04-21 NOTE — Patient Instructions (Signed)

## 2014-04-27 ENCOUNTER — Telehealth: Payer: Self-pay

## 2014-04-27 MED ORDER — OMEPRAZOLE 40 MG PO CPDR: 40.0000 mg | DELAYED_RELEASE_CAPSULE | Freq: Every day | ORAL | Status: DC

## 2014-04-27 NOTE — Telephone Encounter (Signed)
Sent 90 day supply of Omeprazole to pharmacy per Dr. Blanch Media EGD assessment.

## 2014-04-28 ENCOUNTER — Ambulatory Visit (INDEPENDENT_AMBULATORY_CARE_PROVIDER_SITE_OTHER): Payer: BC Managed Care – PPO | Admitting: Podiatrist

## 2014-04-28 ENCOUNTER — Encounter: Payer: Self-pay | Admitting: Podiatrist

## 2014-04-28 DIAGNOSIS — L97514 Non-pressure chronic ulcer of other part of right foot with necrosis of bone: Secondary | ICD-10-CM

## 2014-04-28 MED ORDER — HYDROCODONE-ACETAMINOPHEN 5-325 MG PO TABS: 1.0000 | ORAL_TABLET | ORAL | Status: DC | PRN

## 2014-05-01 NOTE — Progress Notes (Signed)
Subjective: Patient presents today for followup of ulceration fifth metatarsal head right foot. She saw an orthopedist who did an mri on the foot-- osteomyelitis was confirmed and she is scheduled for a fifth met head resection and resection of fifth digit. She would like her wound debrided to keep the area from filling with pus and causing pain.  She denies any systemic or localized signs of infection.  She states the foot is still painful and her surgery is scheduled for December 24th.  Objective: continued ulceration is present lateral fifth metatarsal head right.  Patients mri confirms the suspicion of osteomyelitis of which we have been considering for the past several visits.  Hyperkeratotic lesion is present but minimal.  No pus or drainage expressed.    Assessment: Ulceration fifth metatarsal head right with bony exostosis and  Positive  bone infection.   Plan: Debrided the ulceration  Applied iodosorb and a dressing. She will follow up with her orthopedic surgeon.  If any questions or concerns arise she will call.

## 2014-05-08 ENCOUNTER — Encounter (HOSPITAL_COMMUNITY)
Admission: RE | Admit: 2014-05-08 | Discharge: 2014-05-08 | Disposition: A | Discharge status: Home / Self Care | Payer: BC Managed Care – PPO | Source: Ambulatory Visit | Attending: Orthopedic Surgery | Admitting: Orthopedic Surgery

## 2014-05-08 ENCOUNTER — Encounter (HOSPITAL_COMMUNITY): Payer: Self-pay

## 2014-05-08 DIAGNOSIS — Z01812 Encounter for preprocedural laboratory examination: Secondary | ICD-10-CM | POA: Insufficient documentation

## 2014-05-08 HISTORY — DX: Cardiac arrhythmia, unspecified: I49.9

## 2014-05-08 LAB — BASIC METABOLIC PANEL
ANION GAP: 16 — AB (ref 5–15)
BUN: 20 mg/dL (ref 6–23)
CHLORIDE: 99 meq/L (ref 96–112)
CO2: 26 mEq/L (ref 19–32)
Calcium: 9.1 mg/dL (ref 8.4–10.5)
Creatinine, Ser: 0.83 mg/dL (ref 0.50–1.10)
GFR calc non Af Amer: 77 mL/min — ABNORMAL LOW (ref >90)
GFR, EST AFRICAN AMERICAN: 90 mL/min — AB (ref >90)
Glucose, Bld: 94 mg/dL (ref 70–99)
POTASSIUM: 4.1 meq/L (ref 3.7–5.3)
SODIUM: 141 meq/L (ref 137–147)

## 2014-05-08 LAB — CBC
HCT: 41.3 % (ref 36.0–46.0)
HEMOGLOBIN: 13.5 g/dL (ref 12.0–15.0)
MCH: 27.3 pg (ref 26.0–34.0)
MCHC: 32.7 g/dL (ref 30.0–36.0)
MCV: 83.6 fL (ref 78.0–100.0)
Platelets: 215 10*3/uL (ref 150–400)
RBC: 4.94 MIL/uL (ref 3.87–5.11)
RDW: 15.3 % (ref 11.5–15.5)
WBC: 12.4 10*3/uL — ABNORMAL HIGH (ref 4.0–10.5)

## 2014-05-08 NOTE — Pre-Procedure Instructions (Signed)
[name]  05/08/2014   Your procedure is scheduled on:  05/18/2014  Report to [facility] Admitting   ENTRANCE A  at 5:30 AM.  Call this number if you have problems the morning of surgery: [phone]   Remember:   Do not eat food or drink liquids after midnight.on [facility]   Take these medicines the morning of surgery with A SIP OF WATER: pain medicine as needed, inhalers as needed, DILTIAZEM, Thyroid medicine if needed, omeprazole    Do not wear jewelry   Do not wear lotions, powders, or perfumes. You may wear deodorant.  Do not shave 48 hours prior to surgery.   Do not bring valuables to the hospital.  [facility] is not responsible                  for any belongings or valuables.               Contacts, dentures or bridgework may not be worn into surgery.  Leave suitcase in the car. After surgery it may be brought to your room.  For patients admitted to the hospital, discharge time is determined by your                treatment team.               Patients discharged the day of surgery will not be allowed to drive  home.  Name and phone number of your driver: with family  Special Instructions: Special Instructions: [place] - Preparing for Surgery  Before surgery, you can play an important role.  Because skin is not sterile, your skin needs to be as free of germs as possible.  You can reduce the number of germs on you skin by washing with CHG (chlorahexidine gluconate) soap before surgery.  CHG is an antiseptic cleaner which kills germs and bonds with the skin to continue killing germs even after washing.  Please DO NOT use if you have an allergy to CHG or antibacterial soaps.  If your skin becomes reddened/irritated stop using the CHG and inform your nurse when you arrive at Short Stay.  Do not shave (including legs and underarms) for at least 48 hours prior to the first CHG shower.  You may shave your face.  Please follow these instructions  carefully:   1.  Shower with CHG Soap the night before surgery and the  morning of Surgery.  2.  If you choose to wash your hair, wash your hair first as usual with your  normal shampoo.  3.  After you shampoo, rinse your hair and body thoroughly to remove the  Shampoo.  4.  Use CHG as you would any other liquid soap.  You can apply chg directly to the skin and wash gently with scrungie or a clean washcloth.  5.  Apply the CHG Soap to your body ONLY FROM THE NECK DOWN.    Do not use on open wounds or open sores.  Avoid contact with your eyes, ears, mouth and genitals (private parts).  Wash genitals (private parts)   with your normal soap.  6.  Wash thoroughly, paying special attention to the area where your surgery will be performed.  7.  Thoroughly rinse your body with warm water from the neck down.  8.  DO NOT shower/wash with your normal soap after using and rinsing off   the CHG Soap.  9.  Pat yourself dry with a clean towel.  10.  Wear clean pajamas.            11.  Place clean sheets on your bed the night of your first shower and do not sleep with pets.  Day of Surgery  Do not apply any lotions/deodorants the morning of surgery.  Please wear clean clothes to the hospital/surgery center.   Please read over the following fact sheets that you were given: Pain Booklet, Coughing and Deep Breathing and Surgical Site Infection Prevention

## 2014-05-08 NOTE — Progress Notes (Signed)
Spoke with Dr. Nona Dell office, Jackelyn Poling will let him know that orders need to be signed.

## 2014-05-08 NOTE — Progress Notes (Signed)
   05/08/14 1004  OBSTRUCTIVE SLEEP APNEA  Have you ever been diagnosed with sleep apnea through a sleep study? No  Do you snore loudly (loud enough to be heard through closed doors)?  0  Do you often feel tired, fatigued, or sleepy during the daytime? 0  Has anyone observed you stop breathing during your sleep? 0  Do you have, or are you being treated for high blood pressure? 1  BMI more than 35 kg/m2? 1  Age over 57 years old? 1  Neck circumference greater than 40 cm/16 inches? 1  Gender: 0  Obstructive Sleep Apnea Score 4  Score 4 or greater  Results sent to PCP

## 2014-05-08 NOTE — Progress Notes (Signed)
Pt. Reports that she remains off Xarelto, post colonoscopy.  Pt. Is followed by Mar Daring at [facility] for cardiac. Denies any changes in her chest or breathing since her last ekg & cxr.

## 2014-05-17 ENCOUNTER — Other Ambulatory Visit: Payer: Self-pay | Admitting: Orthopedic Surgery

## 2014-05-18 ENCOUNTER — Encounter (HOSPITAL_COMMUNITY): Payer: Self-pay | Admitting: *Deleted

## 2014-05-18 ENCOUNTER — Encounter (HOSPITAL_COMMUNITY)
Admission: RE | Disposition: A | Discharge status: Home / Self Care | Payer: Self-pay | Source: Ambulatory Visit | Attending: Orthopedic Surgery

## 2014-05-18 ENCOUNTER — Ambulatory Visit (HOSPITAL_COMMUNITY): Payer: BC Managed Care – PPO | Admitting: Vascular Surgery

## 2014-05-18 ENCOUNTER — Ambulatory Visit (HOSPITAL_COMMUNITY)
Admission: RE | Admit: 2014-05-18 | Discharge: 2014-05-18 | Disposition: A | Discharge status: Home / Self Care | Payer: BC Managed Care – PPO | Source: Ambulatory Visit | Attending: Orthopedic Surgery | Admitting: Orthopedic Surgery

## 2014-05-18 ENCOUNTER — Ambulatory Visit (HOSPITAL_COMMUNITY): Payer: BC Managed Care – PPO | Admitting: Anesthesiology

## 2014-05-18 DIAGNOSIS — E039 Hypothyroidism, unspecified: Secondary | ICD-10-CM | POA: Insufficient documentation

## 2014-05-18 DIAGNOSIS — K219 Gastro-esophageal reflux disease without esophagitis: Secondary | ICD-10-CM | POA: Diagnosis not present

## 2014-05-18 DIAGNOSIS — M797 Fibromyalgia: Secondary | ICD-10-CM | POA: Diagnosis not present

## 2014-05-18 DIAGNOSIS — E559 Vitamin D deficiency, unspecified: Secondary | ICD-10-CM | POA: Diagnosis not present

## 2014-05-18 DIAGNOSIS — Z6841 Body Mass Index (BMI) 40.0 and over, adult: Secondary | ICD-10-CM | POA: Diagnosis not present

## 2014-05-18 DIAGNOSIS — J45909 Unspecified asthma, uncomplicated: Secondary | ICD-10-CM | POA: Insufficient documentation

## 2014-05-18 DIAGNOSIS — Z85850 Personal history of malignant neoplasm of thyroid: Secondary | ICD-10-CM | POA: Insufficient documentation

## 2014-05-18 DIAGNOSIS — I4891 Unspecified atrial fibrillation: Secondary | ICD-10-CM | POA: Insufficient documentation

## 2014-05-18 DIAGNOSIS — I10 Essential (primary) hypertension: Secondary | ICD-10-CM | POA: Insufficient documentation

## 2014-05-18 DIAGNOSIS — M868X7 Other osteomyelitis, ankle and foot: Secondary | ICD-10-CM | POA: Diagnosis not present

## 2014-05-18 DIAGNOSIS — L97519 Non-pressure chronic ulcer of other part of right foot with unspecified severity: Secondary | ICD-10-CM | POA: Diagnosis present

## 2014-05-18 DIAGNOSIS — L97511 Non-pressure chronic ulcer of other part of right foot limited to breakdown of skin: Secondary | ICD-10-CM

## 2014-05-18 HISTORY — PX: AMPUTATION: SHX166

## 2014-05-18 SURGERY — AMPUTATION, FOOT, PARTIAL
Anesthesia: Monitor Anesthesia Care | Site: Abdomen | Laterality: Right

## 2014-05-18 MED ORDER — OXYCODONE HCL 5 MG PO TABS
5.0000 mg | ORAL_TABLET | Freq: Once | ORAL | Status: AC | PRN
  Administered 2014-05-18: 5 mg via ORAL

## 2014-05-18 MED ORDER — OXYCODONE HCL 5 MG/5ML PO SOLN: 5.0000 mg | Freq: Once | ORAL | Status: AC | PRN

## 2014-05-18 MED ORDER — PROPOFOL INFUSION 10 MG/ML OPTIME
INTRAVENOUS | Status: DC | PRN
  Administered 2014-05-18: 160 ug/kg/min via INTRAVENOUS

## 2014-05-18 MED ORDER — HYDROMORPHONE HCL 1 MG/ML IJ SOLN
0.2500 mg | INTRAMUSCULAR | Status: DC | PRN
  Administered 2014-05-18: 0.5 mg via INTRAVENOUS

## 2014-05-18 MED ORDER — MIDAZOLAM HCL 2 MG/2ML IJ SOLN
INTRAMUSCULAR | Status: AC
  Filled 2014-05-18: qty 2

## 2014-05-18 MED ORDER — DOXYCYCLINE HYCLATE 100 MG PO CAPS: 100.0000 mg | ORAL_CAPSULE | Freq: Two times a day (BID) | ORAL | Status: DC

## 2014-05-18 MED ORDER — PROPOFOL 10 MG/ML IV BOLUS
INTRAVENOUS | Status: DC | PRN
  Administered 2014-05-18 (×3): 20 mg via INTRAVENOUS

## 2014-05-18 MED ORDER — OXYCODONE HCL 5 MG PO TABS
ORAL_TABLET | ORAL | Status: AC
  Filled 2014-05-18: qty 1

## 2014-05-18 MED ORDER — CHLORHEXIDINE GLUCONATE 4 % EX LIQD
60.0000 mL | Freq: Once | CUTANEOUS | Status: DC
  Filled 2014-05-18: qty 60

## 2014-05-18 MED ORDER — HYDROMORPHONE HCL 1 MG/ML IJ SOLN
INTRAMUSCULAR | Status: DC
Start: 2014-05-18 — End: 2014-05-18
  Filled 2014-05-18: qty 1

## 2014-05-18 MED ORDER — SODIUM CHLORIDE 0.9 % IV SOLN: INTRAVENOUS | Status: DC

## 2014-05-18 MED ORDER — PROPOFOL 10 MG/ML IV BOLUS
INTRAVENOUS | Status: AC
  Filled 2014-05-18: qty 20

## 2014-05-18 MED ORDER — VANCOMYCIN HCL IN DEXTROSE 1-5 GM/200ML-% IV SOLN
INTRAVENOUS | Status: AC
  Administered 2014-05-18: 1000 mg via INTRAVENOUS
  Filled 2014-05-18: qty 200

## 2014-05-18 MED ORDER — ONDANSETRON HCL 4 MG/2ML IJ SOLN
INTRAMUSCULAR | Status: AC
  Filled 2014-05-18: qty 4

## 2014-05-18 MED ORDER — PROMETHAZINE HCL 25 MG/ML IJ SOLN
6.2500 mg | INTRAMUSCULAR | Status: DC | PRN
Start: 2014-05-18 — End: 2014-05-18

## 2014-05-18 MED ORDER — SUCCINYLCHOLINE CHLORIDE 20 MG/ML IJ SOLN
INTRAMUSCULAR | Status: AC
  Filled 2014-05-18: qty 2

## 2014-05-18 MED ORDER — HYDROCODONE-ACETAMINOPHEN 5-325 MG PO TABS: 1.0000 | ORAL_TABLET | Freq: Four times a day (QID) | ORAL | Status: DC | PRN

## 2014-05-18 MED ORDER — FENTANYL CITRATE 0.05 MG/ML IJ SOLN
INTRAMUSCULAR | Status: AC
  Filled 2014-05-18: qty 5

## 2014-05-18 MED ORDER — LACTATED RINGERS IV SOLN
INTRAVENOUS | Status: DC | PRN
  Administered 2014-05-18: 07:00:00 via INTRAVENOUS

## 2014-05-18 MED ORDER — FENTANYL CITRATE 0.05 MG/ML IJ SOLN
INTRAMUSCULAR | Status: DC | PRN
  Administered 2014-05-18 (×2): 100 ug via INTRAVENOUS
  Administered 2014-05-18: 50 ug via INTRAVENOUS

## 2014-05-18 MED ORDER — ROCURONIUM BROMIDE 50 MG/5ML IV SOLN
INTRAVENOUS | Status: AC
  Filled 2014-05-18: qty 1

## 2014-05-18 MED ORDER — 0.9 % SODIUM CHLORIDE (POUR BTL) OPTIME
TOPICAL | Status: DC | PRN
  Administered 2014-05-18: 1000 mL

## 2014-05-18 MED ORDER — MIDAZOLAM HCL 5 MG/5ML IJ SOLN
INTRAMUSCULAR | Status: DC | PRN
  Administered 2014-05-18: 2 mg via INTRAVENOUS

## 2014-05-18 SURGICAL SUPPLY — 48 items
BANDAGE ELASTIC 4 VELCRO ST LF (GAUZE/BANDAGES/DRESSINGS) ×1 IMPLANT
BANDAGE ELASTIC 6 VELCRO ST LF (GAUZE/BANDAGES/DRESSINGS) ×1 IMPLANT
BLADE SAW SGTL MED 73X18.5 STR (BLADE) ×1 IMPLANT
BNDG CMPR 9X4 STRL LF SNTH (GAUZE/BANDAGES/DRESSINGS) ×1
BNDG COHESIVE 4X5 TAN STRL (GAUZE/BANDAGES/DRESSINGS) ×1 IMPLANT
BNDG ESMARK 4X9 LF (GAUZE/BANDAGES/DRESSINGS) ×2 IMPLANT
CANISTER SUCT 3000ML (MISCELLANEOUS) ×2 IMPLANT
CONT SPEC 4OZ CLIKSEAL STRL BL (MISCELLANEOUS) ×1 IMPLANT
COVER SURGICAL LIGHT HANDLE (MISCELLANEOUS) ×2 IMPLANT
CUFF TOURNIQUET SINGLE 34IN LL (TOURNIQUET CUFF) IMPLANT
CUFF TOURNIQUET SINGLE 44IN (TOURNIQUET CUFF) IMPLANT
DRAPE U-SHAPE 47X51 STRL (DRAPES) ×4 IMPLANT
DRSG ADAPTIC 3X8 NADH LF (GAUZE/BANDAGES/DRESSINGS) ×2 IMPLANT
DRSG PAD ABDOMINAL 8X10 ST (GAUZE/BANDAGES/DRESSINGS) ×1 IMPLANT
DURAPREP 26ML APPLICATOR (WOUND CARE) ×2 IMPLANT
ELECT REM PT RETURN 9FT ADLT (ELECTROSURGICAL) ×2
ELECTRODE REM PT RTRN 9FT ADLT (ELECTROSURGICAL) ×1 IMPLANT
GAUZE SPONGE 4X4 12PLY STRL (GAUZE/BANDAGES/DRESSINGS) ×1 IMPLANT
GLOVE BIO SURGEON STRL SZ7 (GLOVE) ×2 IMPLANT
GLOVE BIO SURGEON STRL SZ8 (GLOVE) ×3 IMPLANT
GLOVE BIOGEL PI IND STRL 7.5 (GLOVE) ×1 IMPLANT
GLOVE BIOGEL PI IND STRL 8 (GLOVE) ×1 IMPLANT
GLOVE BIOGEL PI INDICATOR 7.5 (GLOVE) ×2
GLOVE BIOGEL PI INDICATOR 8 (GLOVE) ×1
GOWN STRL REUS W/ TWL LRG LVL3 (GOWN DISPOSABLE) ×1 IMPLANT
GOWN STRL REUS W/ TWL XL LVL3 (GOWN DISPOSABLE) ×1 IMPLANT
GOWN STRL REUS W/TWL LRG LVL3 (GOWN DISPOSABLE) ×2
GOWN STRL REUS W/TWL XL LVL3 (GOWN DISPOSABLE) ×2
KIT BASIN OR (CUSTOM PROCEDURE TRAY) ×2 IMPLANT
KIT ROOM TURNOVER OR (KITS) ×2 IMPLANT
NS IRRIG 1000ML POUR BTL (IV SOLUTION) ×2 IMPLANT
PACK ORTHO EXTREMITY (CUSTOM PROCEDURE TRAY) ×2 IMPLANT
PAD ABD 8X10 STRL (GAUZE/BANDAGES/DRESSINGS) ×2 IMPLANT
PAD ARMBOARD 7.5X6 YLW CONV (MISCELLANEOUS) ×4 IMPLANT
PAD CAST 4YDX4 CTTN HI CHSV (CAST SUPPLIES) ×1 IMPLANT
PADDING CAST ABS 6INX4YD NS (CAST SUPPLIES)
PADDING CAST ABS COTTON 6X4 NS (CAST SUPPLIES) ×1 IMPLANT
PADDING CAST COTTON 4X4 STRL (CAST SUPPLIES) ×2
SPONGE GAUZE 4X4 12PLY STER LF (GAUZE/BANDAGES/DRESSINGS) ×1 IMPLANT
SPONGE LAP 18X18 X RAY DECT (DISPOSABLE) ×2 IMPLANT
STOCKINETTE IMPERVIOUS LG (DRAPES) IMPLANT
SUCTION FRAZIER TIP 10 FR DISP (SUCTIONS) ×2 IMPLANT
SUT ETHILON 2 0 PSLX (SUTURE) ×4 IMPLANT
TOWEL OR 17X24 6PK STRL BLUE (TOWEL DISPOSABLE) ×1 IMPLANT
TOWEL OR 17X26 10 PK STRL BLUE (TOWEL DISPOSABLE) ×2 IMPLANT
TUBE CONNECTING 12X1/4 (SUCTIONS) ×2 IMPLANT
UNDERPAD 30X30 INCONTINENT (UNDERPADS AND DIAPERS) ×2 IMPLANT
WATER STERILE IRR 1000ML POUR (IV SOLUTION) ×2 IMPLANT

## 2014-05-18 NOTE — Anesthesia Postprocedure Evaluation (Signed)
Anesthesia Post Note  Patient: [name]  Procedure(s) Performed: Procedure(s) (LRB): RIGHT FIFTH RAY AMPUTATION FOOT (Right)  Anesthesia type: general  Patient location: PACU  Post pain: Pain level controlled  Post assessment: Patient's Cardiovascular Status Stable  Last Vitals:  Filed Vitals:   05/18/14 0830  BP: 113/39  Pulse: 81  Temp: 36.7 C  Resp: 15    Post vital signs: Reviewed and stable  Level of consciousness: sedated  Complications: No apparent anesthesia complications

## 2014-05-18 NOTE — Discharge Instructions (Signed)
Jamie Simmer, MD [place]  Please read the following information regarding your care after surgery.  Medications  You only need a prescription for the narcotic pain medicine (ex. oxycodone, Percocet, Norco).  All of the other medicines listed below are available over the counter. X norco as prescribed for severe pain  Narcotic pain medicine (ex. oxycodone, Percocet, Vicodin) will cause constipation.  To prevent this problem, take the following medicines while you are taking any pain medicine. X docusate sodium (Colace) 100 mg twice a day X senna (Senokot) 2 tablets twice a day  Weight Bearing X Bear weight when you are able on your operated leg or foot in the post-op shoe.  Cast / Splint / Dressing X Keep your dressing clean and dry.  Dont put anything (coat hanger, pencil, etc) down inside of it.  If it gets damp, use a hair dryer on the cool setting to dry it.  If it gets soaked, call the office to schedule an appointment for a cast change.  After your dressing, cast or splint is removed; you may shower, but do not soak or scrub the wound.  Allow the water to run over it, and then gently pat it dry.  Swelling It is normal for you to have swelling where you had surgery.  To reduce swelling and pain, keep your toes above your nose for at least 3 days after surgery.  It may be necessary to keep your foot or leg elevated for several weeks.  If it hurts, it should be elevated.  Follow Up Call my office at [phone] when you are discharged from the hospital or surgery center to schedule an appointment to be seen two weeks after surgery.  Call my office at [phone] if you develop a fever >101.5 F, nausea, vomiting, bleeding from the surgical site or severe pain.

## 2014-05-18 NOTE — Brief Op Note (Signed)
05/18/2014  8:10 AM  PATIENT:  [name]  58 y.o. female  PRE-OPERATIVE DIAGNOSIS:  RIGHT FIFTH METATARSAL OSTEOMYELITIS and ABSCESS  POST-OPERATIVE DIAGNOSIS:  RIGHT FIFTH METATARSAL OSTEOMYELITIS and ABSCESS  Procedure(s): RIGHT foot FIFTH RAY AMPUTATION  SURGEON:  Wylene Simmer, MD  ASSISTANT: n/a  ANESTHESIA:   General, regional  EBL:  minimal   TOURNIQUET:  approx 10 min with an ankle esmarch  COMPLICATIONS:  None apparent  DISPOSITION:  Extubated, awake and stable to recovery.  DICTATION ID:  [id number]

## 2014-05-18 NOTE — H&P (Signed)
[name] is an 58 y.o. female.   Chief Complaint: right foot ulcer and osteomyelitis HPI: 58 y/o female with right lateral forefoot ulcer and osteomyelitis of the 5th MT head.  She presents for right 5th ray amputation.  She's taking no abx currently.  Past Medical History  Diagnosis Date  . Allergic rhinitis   . Asthma   . Hypertension   . Hypothyroidism   . Anemia, iron deficiency   . Vitamin D deficiency   . Morbid obesity   . GERD (gastroesophageal reflux disease)   . Hypoparathyroidism 01/07/2011  . Fatty liver 01/07/2012  . Headache(784.0)     occasional sinus headache   . Atrial fibrillation August 2012    OFF XARELTO LAST MONTH DUE TO BLEEDING IN STOOL  . History of blood transfusion JULY 2015  . Fibroid 1974    fibroid cyst on left fallopian tube  . Abnormal Pap smear     years ago/no biopsy  . Bursitis of hip left  . Edema of both legs   . Enlarged liver   . Iron deficiency anemia   . Foot ulcer     AREA HEALED RIGHT FOOT  . Seizures     infancy secondary to fever  . Nephrolithiasis     SEES DR Risa Grill  . Thyroid cancer 2011    THYROIDECTOMY DONE  . Uterine cancer DX 2014    NO CURRENT TX FOR  . PONV (postoperative nausea and vomiting)   . Pernicious anemia 02/20/2014  . Dysrhythmia     afib, followed by Dr. Stanford Breed   . Arthritis     back- lower  . Fibromyalgia     Past Surgical History  Procedure Laterality Date  . Tonsillectomy and adenoidectomy    . Bunionectomy      bilateral  . Appendectomy    . Gastric bypass  1974  . Thyroidectomy  05/15/2010  . Cyst on ovary removed     . Ureteroscopy  03/03/2012    Procedure: URETEROSCOPY;  Surgeon: Bernestine Amass, MD;  Location: [facility];  Service: Urology;  Laterality: Left;  . Cystoscopy w/ retrogrades  03/03/2012    Procedure: CYSTOSCOPY WITH RETROGRADE PYELOGRAM;  Surgeon: Bernestine Amass, MD;  Location: [facility];  Service: Urology;  Laterality: Bilateral;  . Lithotripsy  03/2012  . Cystoscopy with  retrograde pyelogram, ureteroscopy and stent placement Left 08/25/2012    Procedure: CYSTOSCOPY WITH RETROGRADE PYELOGRAM, URETEROSCOPY;  Surgeon: Bernestine Amass, MD;  Location: [facility];  Service: Urology;  Laterality: Left;  . Holmium laser application Left 01/28/6386    Procedure: HOLMIUM LASER APPLICATION;  Surgeon: Bernestine Amass, MD;  Location: [facility];  Service: Urology;  Laterality: Left;  . Dilation and curettage of uterus N/A 02/21/2013    Procedure: DILATATION AND CURETTAGE;  Surgeon: Lyman Speller, MD;  Location: [facility];  Service: Gynecology;  Laterality: N/A;  . Colonoscopy with propofol N/A 02/02/2014    Procedure: COLONOSCOPY WITH PROPOFOL;  Surgeon: Irene Shipper, MD;  Location: [facility];  Service: Endoscopy;  Laterality: N/A;  . Esophagogastroduodenoscopy (egd) with propofol N/A 02/02/2014    Procedure: ESOPHAGOGASTRODUODENOSCOPY (EGD) WITH PROPOFOL;  Surgeon: Irene Shipper, MD;  Location: [facility];  Service: Endoscopy;  Laterality: N/A;    Family History  Problem Relation Age of Onset  . Hypertension Father   . Diabetes Father   . Lung cancer Father   . Heart attack Father     MI at age 69  .  Hypertension Mother   . Hyperthyroidism Mother   . Heart disease Mother   . Heart attack Mother     MI at age 75  . Asthma Brother   . Hypertension Brother     younger  . Heart disease Brother     older  . Colon cancer Neg Hx   . Esophageal cancer Neg Hx   . Stomach cancer Neg Hx   . Kidney disease Neg Hx   . Liver disease Neg Hx    Social History:  reports that she quit smoking about 18 years ago. Her smoking use included Cigarettes. She started smoking about 43 years ago. She has a 12.5 pack-year smoking history. She has never used smokeless tobacco. She reports that she drinks alcohol. She reports that she does not use illicit drugs.  Allergies:  Allergies  Allergen Reactions  . Other Anaphylaxis    NUTS  . Amoxicillin-Pot Clavulanate Other (See Comments)     headache  . Food Hives    Potato  . Penicillins Other (See Comments)    headache  . Tomato Hives    Medications Prior to Admission  Medication Sig Dispense Refill  . albuterol (PROVENTIL HFA;VENTOLIN HFA) 108 (90 BASE) MCG/ACT inhaler Inhale 2 puffs into the lungs every 6 (six) hours as needed for wheezing or shortness of breath.    . Ascorbic Acid (VITAMIN C CR) 1000 MG TBCR Take 1 tablet by mouth 2 (two) times a week. On Tuesday and Thursday.    [name] BLACK COHOSH PO Take 60 mg by mouth daily.    . budesonide-formoterol (SYMBICORT) 160-4.5 MCG/ACT inhaler Inhale 2 puffs into the lungs 2 (two) times daily.    . calcitRIOL (ROCALTROL) 0.5 MCG capsule Take 1.5 mcg by mouth daily.     . calcium carbonate (TUMS - DOSED IN MG ELEMENTAL CALCIUM) 500 MG chewable tablet Chew 1 tablet by mouth daily.     . cyclobenzaprine (FLEXERIL) 10 MG tablet Take 10 mg by mouth at bedtime.    [name] diltiazem (CARTIA XT) 240 MG 24 hr capsule Take 1 capsule (240 mg total) by mouth daily. (Patient taking differently: Take 240 mg by mouth daily before breakfast. ) 90 capsule 3  . ergocalciferol (VITAMIN D2) 50000 UNITS capsule Take 50,000 Units by mouth 2 (two) times a week. On Saturday and Wednesday    . fluticasone (FLONASE) 50 MCG/ACT nasal spray Place 2 sprays into both nostrils daily as needed for allergies or rhinitis.     . furosemide (LASIX) 40 MG tablet Take 1 tablet (40 mg total) by mouth daily. 90 tablet 1  . hydrochlorothiazide (HYDRODIURIL) 25 MG tablet Take 1 tablet (25 mg total) by mouth daily. 90 tablet 1  . HYDROcodone-acetaminophen (NORCO/VICODIN) 5-325 MG per tablet Take 1-2 tablets by mouth every 4 (four) hours as needed. (Patient taking differently: Take 1-2 tablets by mouth every 4 (four) hours as needed for moderate pain. ) 60 tablet 0  . iron polysaccharides (NU-IRON) 150 MG capsule Take 150 mg by mouth daily.     [name] levothyroxine (SYNTHROID, LEVOTHROID) 137 MCG tablet Take 411 mcg by mouth daily.  Takes 3 tabs in AM    . loratadine (CLARITIN) 10 MG tablet Take 10 mg by mouth daily.    . methocarbamol (ROBAXIN) 500 MG tablet Take 500 mg by mouth 2 (two) times daily.    [name] nystatin cream (MYCOSTATIN) Apply 1 application topically 2 (two) times daily as needed for dry skin.     [name]  omeprazole (PRILOSEC) 40 MG capsule Take 1 capsule (40 mg total) by mouth daily. (Patient taking differently: Take 40 mg by mouth daily before breakfast. ) 90 capsule 1  . traMADol (ULTRAM) 50 MG tablet Take 50 mg by mouth every 6 (six) hours as needed for moderate pain.    [name] UNABLE TO FIND MANGANESE 60MG  TAKE ONE PILL WITH evening  MEAL    . valsartan (DIOVAN) 80 MG tablet Take 80 mg by mouth daily before breakfast.     . vitamin B-12 (CYANOCOBALAMIN) 1000 MCG tablet Take 1,000 mcg by mouth 2 (two) times a week. On Tuesday and Thursday with Iron.    . vitamin E (VITAMIN E) 1000 UNIT capsule Take 1 two times a week.    . zafirlukast (ACCOLATE) 20 MG tablet Take 1 tablet (20 mg total) by mouth 2 (two) times daily before a meal. 180 tablet 1  . albuterol (PROVENTIL) (2.5 MG/3ML) 0.083% nebulizer solution Take 2.5 mg by nebulization every 6 (six) hours as needed for wheezing or shortness of breath.    . SYMBICORT 160-4.5 MCG/ACT inhaler INHALE 2 PUFFS BY MOUTH TWICE DAILY (Patient not taking: Reported on 05/05/2014) 10.2 g 2  . traMADol (ULTRAM) 50 MG tablet TAKE 1 TABLET BY MOUTH EVERY 6 HOURS AS NEEDED (Patient not taking: Reported on 05/05/2014) 30 tablet 0  . valsartan (DIOVAN) 80 MG tablet TAKE 1 TABLET BY MOUTH EVERY DAY (Patient not taking: Reported on 05/05/2014) 30 tablet 0    No results found for this or any previous visit (from the past 48 hour(s)). No results found.  ROS  No recent f/c/n/v/wt loss  Blood pressure 160/70, pulse 84, temperature 98.9 F (37.2 C), temperature source Oral, resp. rate 18, weight 142.429 kg (314 lb), last menstrual period 05/26/2009, SpO2 96 %. Physical Exam  wn wd woman in  nad.  A and O x 4.  Mood and affect normal.  EOMI.  Resp unlabored.  R foot with lateral forefoot ulcer.  Swollen around this area.  Skin o/w intact.  Sens to LT at the forefoot diminished.  Palpable DP and PT pulses.  Assessment/Plan R 5th ray ulcer and osteomyelitis - to OR for right 5th ray amputation.  The risks and benefits of the alternative treatment options have been discussed in detail.  The patient wishes to proceed with surgery and specifically understands risks of bleeding, infection, nerve damage, blood clots, need for additional surgery, amputation and death.   Wylene Simmer 06/01/2014, 7:16 AM

## 2014-05-18 NOTE — Op Note (Signed)
[name], [name]               ACCOUNT NO.:  [id number]  MEDICAL RECORD NO.:  [id number]  LOCATION:  [facility]                         FACILITY:  [facility]  PHYSICIAN:  Wylene Simmer, MD        DATE OF BIRTH:  08-14-1955  DATE OF PROCEDURE:  05/18/2014 DATE OF DISCHARGE:                              OPERATIVE REPORT   PREOPERATIVE DIAGNOSIS:  Right 5th metatarsal head osteomyelitis and adjacent soft tissue abscess.  POSTOPERATIVE DIAGNOSIS:  Right 5th metatarsal head osteomyelitis and adjacent soft tissue abscess.  PROCEDURE:  Right foot 5th ray amputation.  ANESTHESIA:  General, regional.  ESTIMATED BLOOD LOSS:  Minimal.  TOURNIQUET TIME:  Approximately 10 minutes with an ankle Esmarch.  COMPLICATIONS:  None apparent.  DISPOSITION:  Extubated, awake, and stable to recovery.  SPECIMEN:  Deep tissue to Microbiology for aerobic and anaerobic culture.  INDICATIONS FOR PROCEDURE:  The patient is a 58 year old woman with past medical history significant for peripheral neuropathy.  She developed an ulcer at the forefoot adjacent to the fifth metatarsal head.  This has failed to heal despite lengthy nonoperative treatment.  An MRI reveals osteomyelitis of the fifth metatarsal head.  She presents now for 5th ray amputation in an effort to cure this refractory infection.  She understands the risks and benefits, the alternative treatment options, and elects surgical treatment.  She specifically understands risks of bleeding, infection, nerve damage, blood clots, need for additional surgery, continued pain, revision amputation, and death.  PROCEDURE IN DETAIL:  After preoperative consent was obtained and the correct operative site was identified, the patient was brought to the operating room and placed supine on the operating table.  General anesthesia was induced.  Preoperative antibiotics were administered. Surgical time-out was taken.  The right lower extremity was prepped and draped  in standard sterile fashion.  The foot was exsanguinated and a 4- inch Esmarch tourniquet wrapped around the ankle.  A racquet style incision was marked on the skin at the base of the fifth toe.  Sharp dissection was carried down through skin and subcutaneous tissue. Subperiosteal dissection was carried along the metatarsal shaft.  The metatarsal shaft was cut with an oscillating saw bevelling the cut appropriately.  The fifth ray was removed and passed off the field in the back table.  The wound was irrigated copiously.  Neurovascular bundles were cauterized.  Tourniquet was released and hemostasis was achieved.  Horizontal mattress sutures of 2-0 nylon were used to close the skin incision.  Sterile dressings were applied followed by compression wrap.  The patient was then awakened from anesthesia and transported to recovery room in stable condition.  The toe was then examined carefully.  The area of abscess was excised and sent as a specimen to Microbiology for aerobic and anaerobic cultures.  Preoperative antibiotics had been held until after the specimen was obtained.  A gram of IV vancomycin was administered upon release of the tourniquet.  FOLLOWUP PLAN:  The patient will be weightbearing as tolerated in a flat postop shoe.  She will follow up with me in 2 weeks for suture removal. She will start oral doxycycline for prophylaxis.     Wylene Simmer, MD  JH/MEDQ  D:  05/18/2014  T:  05/18/2014  Job:  [id number]

## 2014-05-18 NOTE — Anesthesia Preprocedure Evaluation (Addendum)
Anesthesia Evaluation  Patient identified by MRN, date of birth, ID band Patient awake    Reviewed: Allergy & Precautions, NPO status , Patient's Chart, lab work & pertinent test results  History of Anesthesia Complications (+) PONV and history of anesthetic complications  Airway Mallampati: II  TM Distance: >3 FB Neck ROM: Full    Dental   Pulmonary asthma , former smoker,    Pulmonary exam normal       Cardiovascular hypertension, + dysrhythmias Atrial Fibrillation     Neuro/Psych Seizures -,  negative psych ROS   GI/Hepatic Neg liver ROS, GERD-  ,  Endo/Other  Hypothyroidism   Renal/GU negative Renal ROS     Musculoskeletal   Abdominal   Peds  Hematology   Anesthesia Other Findings   Reproductive/Obstetrics                            Anesthesia Physical Anesthesia Plan  ASA: III  Anesthesia Plan: Regional and MAC   Post-op Pain Management:    Induction: Intravenous  Airway Management Planned: Simple Face Mask  Additional Equipment:   Intra-op Plan:   Post-operative Plan:   Informed Consent: I have reviewed the patients History and Physical, chart, labs and discussed the procedure including the risks, benefits and alternatives for the proposed anesthesia with the patient or authorized representative who has indicated his/her understanding and acceptance.   Dental advisory given  Plan Discussed with: CRNA, Anesthesiologist and Surgeon  Anesthesia Plan Comments:        Anesthesia Quick Evaluation

## 2014-05-18 NOTE — Transfer of Care (Signed)
Immediate Anesthesia Transfer of Care Note  Patient: [name]  Procedure(s) Performed: Procedure(s): RIGHT FIFTH RAY AMPUTATION FOOT (Right)  Patient Location: PACU  Anesthesia Type:General and MAC combined with regional for post-op pain  Level of Consciousness: awake, alert  and oriented  Airway & Oxygen Therapy: Patient Spontanous Breathing and Patient connected to nasal cannula oxygen  Post-op Assessment: Report given to PACU RN, Post -op Vital signs reviewed and stable and Patient moving all extremities  Post vital signs: Reviewed and stable  Complications: No apparent anesthesia complications

## 2014-05-18 NOTE — Anesthesia Procedure Notes (Addendum)
Anesthesia Regional Block:  Popliteal block  Pre-Anesthetic Checklist: ,, timeout performed, Correct Patient, Correct Site, Correct Laterality, Correct Procedure, Correct Position, site marked, Risks and benefits discussed,  Surgical consent,  Pre-op evaluation,  At surgeon's request and post-op pain management  Laterality: Right  Prep: chloraprep       Needles:  Injection technique: Single-shot  Needle Type: Echogenic Stimulator Needle     Needle Length: 10cm 10 cm Needle Gauge: 21 and 21 G    Additional Needles:  Procedures: ultrasound guided (picture in chart) and nerve stimulator Popliteal block  Nerve Stimulator or Paresthesia:  Response: plantar flexion, 0.45 mA,   Additional Responses:   Narrative:  Start time: 05/18/2014 7:07 AM End time: 05/18/2014 7:17 AM Injection made incrementally with aspirations every 5 mL.  Performed by: Personally  Anesthesiologist: Duane Boston  Additional Notes: A functioning IV was confirmed and monitors were applied.  Sterile prep and drape, hand hygiene and sterile gloves were used.  Negative aspiration and test dose prior to incremental administration of local anesthetic. The patient tolerated the procedure well.Ultrasound  guidance: relevant anatomy identified, needle position confirmed, local anesthetic spread visualized around nerve(s), vascular puncture avoided.  Image printed for medical record.    Procedure Name: LMA Insertion Date/Time: 05/18/2014 7:46 AM Performed by: Melina Copa, Latroy Gaymon R Pre-anesthesia Checklist: Patient identified, Emergency Drugs available, Suction available, Patient being monitored and Timeout performed Patient Re-evaluated:Patient Re-evaluated prior to inductionOxygen Delivery Method: Circle system utilized Preoxygenation: Pre-oxygenation with 100% oxygen Intubation Type: IV induction Ventilation: Mask ventilation without difficulty LMA: LMA inserted LMA Size: 4.0 Number of attempts: 1 Placement  Confirmation: positive ETCO2 and breath sounds checked- equal and bilateral Tube secured with: Tape Dental Injury: Teeth and Oropharynx as per pre-operative assessment

## 2014-05-21 LAB — TISSUE CULTURE: Culture: NO GROWTH

## 2014-05-22 ENCOUNTER — Encounter (HOSPITAL_COMMUNITY): Payer: Self-pay | Admitting: Orthopedic Surgery

## 2014-05-31 ENCOUNTER — Telehealth: Payer: Self-pay | Admitting: Family

## 2014-06-01 NOTE — Telephone Encounter (Signed)
Ok to send 30 tabs zero refills. 

## 2014-06-02 ENCOUNTER — Ambulatory Visit: Payer: BC Managed Care – PPO | Admitting: Obstetrics & Gynecology

## 2014-06-06 NOTE — Telephone Encounter (Signed)
Pt is following up on med refill, states pharmacy has not received anything

## 2014-06-06 NOTE — Telephone Encounter (Signed)
Rx printed and forwarded to Provider for signature as Rx cannot be phoned in.

## 2014-06-07 ENCOUNTER — Other Ambulatory Visit: Payer: Self-pay | Admitting: Obstetrics & Gynecology

## 2014-06-07 NOTE — Telephone Encounter (Signed)
Rx faxed to pharmacy at 3:05pm

## 2014-06-07 NOTE — Telephone Encounter (Signed)
rx signed

## 2014-06-07 NOTE — Telephone Encounter (Signed)
Medication refill request: Estrace Vaginal Cream Last AEX:  01/03/13 Next AEX: 08/25/14 Last MMG (if hormonal medication request): 2011? Refill authorized: 02/28/13 #42.5g/ 6 R. Today 42.5g/0R?

## 2014-06-12 ENCOUNTER — Ambulatory Visit: Payer: BC Managed Care – PPO | Admitting: Family

## 2014-06-14 ENCOUNTER — Other Ambulatory Visit: Payer: Self-pay | Admitting: Internal Medicine

## 2014-06-14 ENCOUNTER — Ambulatory Visit (INDEPENDENT_AMBULATORY_CARE_PROVIDER_SITE_OTHER): Payer: BLUE CROSS/BLUE SHIELD | Admitting: Family

## 2014-06-14 ENCOUNTER — Encounter: Payer: Self-pay | Admitting: Cardiology

## 2014-06-14 ENCOUNTER — Ambulatory Visit (INDEPENDENT_AMBULATORY_CARE_PROVIDER_SITE_OTHER): Payer: BLUE CROSS/BLUE SHIELD | Admitting: Cardiology

## 2014-06-14 ENCOUNTER — Encounter: Payer: Self-pay | Admitting: Family

## 2014-06-14 VITALS — BP 118/72 | HR 84 | Temp 97.9°F | Resp 18 | Ht 64.0 in | Wt 313.6 lb

## 2014-06-14 VITALS — BP 110/70 | HR 91 | Ht 64.0 in | Wt 309.0 lb

## 2014-06-14 DIAGNOSIS — D510 Vitamin B12 deficiency anemia due to intrinsic factor deficiency: Secondary | ICD-10-CM

## 2014-06-14 DIAGNOSIS — I4891 Unspecified atrial fibrillation: Secondary | ICD-10-CM

## 2014-06-14 DIAGNOSIS — E559 Vitamin D deficiency, unspecified: Secondary | ICD-10-CM

## 2014-06-14 DIAGNOSIS — C541 Malignant neoplasm of endometrium: Secondary | ICD-10-CM

## 2014-06-14 DIAGNOSIS — M869 Osteomyelitis, unspecified: Secondary | ICD-10-CM | POA: Insufficient documentation

## 2014-06-14 DIAGNOSIS — Z85850 Personal history of malignant neoplasm of thyroid: Secondary | ICD-10-CM

## 2014-06-14 DIAGNOSIS — R609 Edema, unspecified: Secondary | ICD-10-CM

## 2014-06-14 DIAGNOSIS — M545 Low back pain: Secondary | ICD-10-CM

## 2014-06-14 DIAGNOSIS — I10 Essential (primary) hypertension: Secondary | ICD-10-CM

## 2014-06-14 HISTORY — DX: Osteomyelitis, unspecified: M86.9

## 2014-06-14 LAB — VITAMIN B12: Vitamin B-12: 199 pg/mL — ABNORMAL LOW (ref 211–911)

## 2014-06-14 LAB — VITAMIN D 25 HYDROXY (VIT D DEFICIENCY, FRACTURES): VITD: 14.54 ng/mL — ABNORMAL LOW (ref 30.00–100.00)

## 2014-06-14 LAB — FOLATE: FOLATE: 16.2 ng/mL (ref >5.9)

## 2014-06-14 MED ORDER — ASPIRIN EC 81 MG PO TBEC: 81.0000 mg | DELAYED_RELEASE_TABLET | Freq: Every day | ORAL | Status: DC

## 2014-06-14 NOTE — Assessment & Plan Note (Signed)
Continue present dose of diuretics. 

## 2014-06-14 NOTE — Assessment & Plan Note (Signed)
Blood pressure controlled. Continue present medications. 

## 2014-06-14 NOTE — Assessment & Plan Note (Signed)
Will obtain level.

## 2014-06-14 NOTE — Assessment & Plan Note (Signed)
Recommended PT referral. She declines currently, wants to wait until the spring. In the absence of radicular symptoms, unlikely to get MRI approved without trying PT first.  Continue tramadol prn, obtain UDS.

## 2014-06-14 NOTE — Assessment & Plan Note (Signed)
She continues b12.  Will check level.

## 2014-06-14 NOTE — Progress Notes (Signed)
Subjective:    Patient ID: [name], female    DOB: [birth year], 59 y.o.   MRN: [id number]  HPI  [name] is a 59 yr old female who presents today for follow up.  1) Ulceration 5th metatarsal head right foot- had MRI which noted osteomyelitis. She had right foot 5th Ray amputation on 05/18/14.  She is in a boot and reports no pain following surgery.    2) Back Pain- she is currently using tramadol as needed. Using 2-3 times a day this month. Pain is non-radiating.  Pain is located in the lower back and is constant at times.  Pain is worse with certain motions.  If she stays moving, then symptoms are improved.  Declines PT now. Wants to do PT in the spring.  Feels that use of walking boot is contributing to her low back pain.  3) Pernicious Anemia- she is followed by Dr. Marin Olp.  She does have some paresthesias in the hands/feet.  Lab Results  Component Value Date   WBC 12.4* 05/08/2014   HGB 13.5 05/08/2014   HCT 41.3 05/08/2014   MCV 83.6 05/08/2014   PLT 215 05/08/2014    4) Hx of thyroid cancer- She also has hx of hypoparathyroidism. Managed by Dr. Buddy Duty (endo).   5) Endometrial Adenocarcinoma- currently followed by GYN [name]) and managed with Mirena. She reports that her follow up study showed no abnormal cells.   She did score high on preop OSA screening tool- however pt reports that she has had 2 normal sleep studies in the past.   Review of Systems See HPI  Past Medical History  Diagnosis Date  . Allergic rhinitis   . Asthma   . Hypertension   . Hypothyroidism   . Anemia, iron deficiency   . Vitamin D deficiency   . Morbid obesity   . GERD (gastroesophageal reflux disease)   . Hypoparathyroidism 01/07/2011  . Fatty liver 01/07/2012  . Headache(784.0)     occasional sinus headache   . Atrial fibrillation August 2012    OFF XARELTO LAST MONTH DUE TO BLEEDING IN STOOL  . History of blood transfusion JULY 2015  . Fibroid 1974    fibroid cyst on  left fallopian tube  . Abnormal Pap smear     years ago/no biopsy  . Bursitis of hip left  . Edema of both legs   . Enlarged liver   . Iron deficiency anemia   . Foot ulcer     AREA HEALED RIGHT FOOT  . Seizures     infancy secondary to fever  . Nephrolithiasis     SEES DR Risa Grill  . Thyroid cancer 2011    THYROIDECTOMY DONE  . Uterine cancer DX 2014    NO CURRENT TX FOR  . PONV (postoperative nausea and vomiting)   . Pernicious anemia 02/20/2014  . Dysrhythmia     afib, followed by Dr. Stanford Breed   . Arthritis     back- lower  . Fibromyalgia     History   Social History  . Marital Status: Single    Spouse Name: N/A    Number of Children: 0  . Years of Education: N/A   Occupational History  . works in Insurance claims handler   . [employer]    Social History Main Topics  . Smoking status: Former Smoker -- 0.50 packs/day for 25 years    Types: Cigarettes    Start date: 12/24/1970    Quit date:  05/27/1995  . Smokeless tobacco: Never Used     Comment: quit smoking 17 years ago  . Alcohol Use: Yes     Comment: 1/2 glass per month  . Drug Use: No  . Sexual Activity:    Partners: Male   Other Topics Concern  . Not on file   Social History Narrative   Occupation: works in Insurance claims handler - Field seismologist   Single       Former Smoker - quit tobacco 12 years ago.  She was light smoker for 10 years.                 Past Surgical History  Procedure Laterality Date  . Tonsillectomy and adenoidectomy    . Bunionectomy      bilateral  . Appendectomy    . Gastric bypass  1974  . Thyroidectomy  05/15/2010  . Cyst on ovary removed     . Ureteroscopy  03/03/2012    Procedure: URETEROSCOPY;  Surgeon: Bernestine Amass, MD;  Location: [facility];  Service: Urology;  Laterality: Left;  . Cystoscopy w/ retrogrades  03/03/2012    Procedure: CYSTOSCOPY WITH RETROGRADE PYELOGRAM;  Surgeon: Bernestine Amass, MD;  Location: [facility];  Service: Urology;  Laterality:  Bilateral;  . Lithotripsy  03/2012  . Cystoscopy with retrograde pyelogram, ureteroscopy and stent placement Left 08/25/2012    Procedure: CYSTOSCOPY WITH RETROGRADE PYELOGRAM, URETEROSCOPY;  Surgeon: Bernestine Amass, MD;  Location: [facility];  Service: Urology;  Laterality: Left;  . Holmium laser application Left 01/26/2354    Procedure: HOLMIUM LASER APPLICATION;  Surgeon: Bernestine Amass, MD;  Location: [facility];  Service: Urology;  Laterality: Left;  . Dilation and curettage of uterus N/A 02/21/2013    Procedure: DILATATION AND CURETTAGE;  Surgeon: Lyman Speller, MD;  Location: [facility];  Service: Gynecology;  Laterality: N/A;  . Colonoscopy with propofol N/A 02/02/2014    Procedure: COLONOSCOPY WITH PROPOFOL;  Surgeon: Irene Shipper, MD;  Location: [facility];  Service: Endoscopy;  Laterality: N/A;  . Esophagogastroduodenoscopy (egd) with propofol N/A 02/02/2014    Procedure: ESOPHAGOGASTRODUODENOSCOPY (EGD) WITH PROPOFOL;  Surgeon: Irene Shipper, MD;  Location: [facility];  Service: Endoscopy;  Laterality: N/A;  . Amputation Right 05/18/2014    Procedure: RIGHT FIFTH RAY AMPUTATION FOOT;  Surgeon: Wylene Simmer, MD;  Location: [facility];  Service: Orthopedics;  Laterality: Right;    Family History  Problem Relation Age of Onset  . Hypertension Father   . Diabetes Father   . Lung cancer Father   . Heart attack Father     MI at age 76  . Hypertension Mother   . Hyperthyroidism Mother   . Heart disease Mother   . Heart attack Mother     MI at age 84  . Asthma Brother   . Hypertension Brother     younger  . Heart disease Brother     older  . Colon cancer Neg Hx   . Esophageal cancer Neg Hx   . Stomach cancer Neg Hx   . Kidney disease Neg Hx   . Liver disease Neg Hx     Allergies  Allergen Reactions  . Other Anaphylaxis    NUTS  . Amoxicillin-Pot Clavulanate Other (See Comments)    headache  . Food Hives    Potato  . Penicillins Other (See Comments)    headache  . Tomato Hives     Current Outpatient Prescriptions on File Prior to  Visit  Medication Sig Dispense Refill  . albuterol (PROVENTIL HFA;VENTOLIN HFA) 108 (90 BASE) MCG/ACT inhaler Inhale 2 puffs into the lungs every 6 (six) hours as needed for wheezing or shortness of breath.    [name] albuterol (PROVENTIL) (2.5 MG/3ML) 0.083% nebulizer solution Take 2.5 mg by nebulization every 6 (six) hours as needed for wheezing or shortness of breath.    . Ascorbic Acid (VITAMIN C CR) 1000 MG TBCR Take 1 tablet by mouth 2 (two) times a week. On Tuesday and Thursday.    [name] BLACK COHOSH PO Take 60 mg by mouth daily.    . budesonide-formoterol (SYMBICORT) 160-4.5 MCG/ACT inhaler Inhale 2 puffs into the lungs 2 (two) times daily.    . calcitRIOL (ROCALTROL) 0.5 MCG capsule Take 1.5 mcg by mouth daily.     . calcium carbonate (TUMS - DOSED IN MG ELEMENTAL CALCIUM) 500 MG chewable tablet Chew 1 tablet by mouth daily.     . cyclobenzaprine (FLEXERIL) 10 MG tablet Take 10 mg by mouth at bedtime.    [name] diltiazem (CARTIA XT) 240 MG 24 hr capsule Take 1 capsule (240 mg total) by mouth daily. (Patient taking differently: Take 240 mg by mouth daily before breakfast. ) 90 capsule 3  . ergocalciferol (VITAMIN D2) 50000 UNITS capsule Take 50,000 Units by mouth 2 (two) times a week. On Saturday and Wednesday    . ESTRACE VAGINAL 0.1 MG/GM vaginal cream APPLY EXTERNALLY VAGINALLY 2 TO 3 TIMES PER WEEK 42.5 g 1  . furosemide (LASIX) 40 MG tablet Take 1 tablet (40 mg total) by mouth daily. 90 tablet 1  . hydrochlorothiazide (HYDRODIURIL) 25 MG tablet Take 1 tablet (25 mg total) by mouth daily. 90 tablet 1  . iron polysaccharides (NU-IRON) 150 MG capsule Take 150 mg by mouth daily.     [name] levothyroxine (SYNTHROID, LEVOTHROID) 137 MCG tablet Take 411 mcg by mouth daily. Takes 3 tabs in AM    . loratadine (CLARITIN) 10 MG tablet Take 10 mg by mouth daily.    . methocarbamol (ROBAXIN) 500 MG tablet Take 500 mg by mouth 2 (two) times daily.    [name] nystatin  cream (MYCOSTATIN) Apply 1 application topically 2 (two) times daily as needed for dry skin.     [name] omeprazole (PRILOSEC) 40 MG capsule Take 1 capsule (40 mg total) by mouth daily. (Patient taking differently: Take 40 mg by mouth daily before breakfast. ) 90 capsule 1  . SYMBICORT 160-4.5 MCG/ACT inhaler INHALE 2 PUFFS BY MOUTH TWICE DAILY 10.2 g 2  . traMADol (ULTRAM) 50 MG tablet TAKE 1 TABLET BY MOUTH EVERY 6 HOURS AS NEEDED FOR PAIN 30 tablet 0  . UNABLE TO FIND MANGANESE 60MG  TAKE ONE PILL WITH evening  MEAL    . valsartan (DIOVAN) 80 MG tablet TAKE 1 TABLET BY MOUTH EVERY DAY 30 tablet 0  . vitamin B-12 (CYANOCOBALAMIN) 1000 MCG tablet Take 1,000 mcg by mouth 2 (two) times a week. On Tuesday and Thursday with Iron.    . vitamin E (VITAMIN E) 1000 UNIT capsule Take 1 two times a week.    . zafirlukast (ACCOLATE) 20 MG tablet Take 1 tablet (20 mg total) by mouth 2 (two) times daily before a meal. 180 tablet 1  . fluticasone (FLONASE) 50 MCG/ACT nasal spray Place 2 sprays into both nostrils daily as needed for allergies or rhinitis.     [name] HYDROcodone-acetaminophen (NORCO) 5-325 MG per tablet Take 1-2 tablets by mouth every 6 (six) hours as  needed for moderate pain. (Patient not taking: Reported on 06/14/2014) 20 tablet 0   No current facility-administered medications on file prior to visit.    BP 118/72 mmHg  Pulse 84  Temp(Src) 97.9 F (36.6 C) (Oral)  Resp 18  Ht 5\' 4"  (1.626 m)  Wt 313 lb 9.6 oz (142.248 kg)  BMI 53.80 kg/m2  SpO2 99%  LMP 05/26/2009       Objective:   Physical Exam  Constitutional: She is oriented to person, place, and time. She appears well-developed and well-nourished. No distress.  HENT:  Head: Normocephalic and atraumatic.  Cardiovascular: Normal rate and regular rhythm.   No murmur heard. Pulmonary/Chest: Effort normal and breath sounds normal. No respiratory distress. She has no wheezes. She has no rales. She exhibits no tenderness.  Musculoskeletal:    R foot in walking boot.   Lymphadenopathy:    She has no cervical adenopathy.  Neurological: She is alert and oriented to person, place, and time.  Psychiatric: She has a normal mood and affect. Her behavior is normal. Judgment and thought content normal.          Assessment & Plan:

## 2014-06-14 NOTE — Assessment & Plan Note (Signed)
Patient remains in sinus rhythm. Continue Cardizem. Her anticoagulation was discontinued because of rectal bleeding. She apparently is felt to be too high risk. We will therefore continue off of xarelto. She understands the higher risk of embolic event. We will instead add aspirin 81 mg daily.

## 2014-06-14 NOTE — Assessment & Plan Note (Signed)
S/p 5th ray amputation, recovering well, has follow up with Ortho.

## 2014-06-14 NOTE — Patient Instructions (Signed)
Your physician wants you to follow-up in: ONE YEAR WITH DR CRENSHAW You will receive a reminder letter in the mail two months in advance. If you don't receive a letter, please call our office to schedule the follow-up appointment.   START ASPIRIN 81 MG ONCE DAILY WITH FOOD 

## 2014-06-14 NOTE — Patient Instructions (Signed)
Please complete lab work prior to leaving.  Follow up in 6 months.

## 2014-06-14 NOTE — Assessment & Plan Note (Signed)
Management per Endo, Dr. Buddy Duty.

## 2014-06-14 NOTE — Assessment & Plan Note (Signed)
Management per GYN.  

## 2014-06-14 NOTE — Progress Notes (Signed)
HPI: FU atrial fibrillation. I initially saw her in July of 2012 for evaluation of chest pain. Echocardiogram in January of 2012 showed normal LV function, mild left ventricular hypertrophy and mild left atrial enlargement. CTA showed no pulmonary embolus in July 2012. Patient had a Myoview in July of 2012. Her ejection fraction is 64%. Patient had mild ischemia in the inferior wall. We elected to treat medically. Patient was also having palpitations and near syncopal episodes. A CardioNet revealed sinus rhythm with PACs, brief runs of PAT and brief runs of PAF. MRA in September of 2014 showed no aneurysm. Since I last saw her, her xarelto was discontinued because of bleeding. Apparently GI workup showed esophagitis but otherwise negative. She denies dyspnea, chest pain or syncope. Occasional brief palpitations.  Current Outpatient Prescriptions  Medication Sig Dispense Refill  . albuterol (PROVENTIL HFA;VENTOLIN HFA) 108 (90 BASE) MCG/ACT inhaler Inhale 2 puffs into the lungs every 6 (six) hours as needed for wheezing or shortness of breath.    [name] albuterol (PROVENTIL) (2.5 MG/3ML) 0.083% nebulizer solution Take 2.5 mg by nebulization every 6 (six) hours as needed for wheezing or shortness of breath.    . Ascorbic Acid (VITAMIN C CR) 1000 MG TBCR Take 1 tablet by mouth 2 (two) times a week. On Tuesday and Thursday.    [name] BLACK COHOSH PO Take 60 mg by mouth daily.    . budesonide-formoterol (SYMBICORT) 160-4.5 MCG/ACT inhaler Inhale 2 puffs into the lungs 2 (two) times daily.    . calcitRIOL (ROCALTROL) 0.5 MCG capsule Take 1.5 mcg by mouth daily.     . calcium carbonate (TUMS - DOSED IN MG ELEMENTAL CALCIUM) 500 MG chewable tablet Chew 1 tablet by mouth daily.     . cyclobenzaprine (FLEXERIL) 10 MG tablet Take 10 mg by mouth at bedtime.    [name] diltiazem (CARTIA XT) 240 MG 24 hr capsule Take 1 capsule (240 mg total) by mouth daily. (Patient taking differently: Take 240 mg by mouth daily before  breakfast. ) 90 capsule 3  . ergocalciferol (VITAMIN D2) 50000 UNITS capsule Take 50,000 Units by mouth 2 (two) times a week. On Saturday and Wednesday    . ESTRACE VAGINAL 0.1 MG/GM vaginal cream APPLY EXTERNALLY VAGINALLY 2 TO 3 TIMES PER WEEK 42.5 g 1  . fluticasone (FLONASE) 50 MCG/ACT nasal spray Place 2 sprays into both nostrils daily as needed for allergies or rhinitis.     . furosemide (LASIX) 40 MG tablet Take 1 tablet (40 mg total) by mouth daily. 90 tablet 1  . hydrochlorothiazide (HYDRODIURIL) 25 MG tablet Take 1 tablet (25 mg total) by mouth daily. 90 tablet 1  . iron polysaccharides (NU-IRON) 150 MG capsule Take 150 mg by mouth 2 (two) times a week.     . levothyroxine (SYNTHROID, LEVOTHROID) 137 MCG tablet Take 411 mcg by mouth daily. Takes 3 tabs in AM    . loratadine (CLARITIN) 10 MG tablet Take 10 mg by mouth daily.    . methocarbamol (ROBAXIN) 500 MG tablet Take 500 mg by mouth 2 (two) times daily.    [name] nystatin cream (MYCOSTATIN) Apply 1 application topically 2 (two) times daily as needed for dry skin.     [name] omeprazole (PRILOSEC) 40 MG capsule Take 1 capsule (40 mg total) by mouth daily. (Patient taking differently: Take 40 mg by mouth daily before breakfast. ) 90 capsule 1  . SYMBICORT 160-4.5 MCG/ACT inhaler INHALE 2 PUFFS BY MOUTH TWICE DAILY 10.2  g 2  . traMADol (ULTRAM) 50 MG tablet TAKE 1 TABLET BY MOUTH EVERY 6 HOURS AS NEEDED FOR PAIN 30 tablet 0  . UNABLE TO FIND MANGANESE 60MG  TAKE ONE PILL WITH evening  MEAL    . valsartan (DIOVAN) 80 MG tablet TAKE 1 TABLET BY MOUTH EVERY DAY 30 tablet 0  . vitamin B-12 (CYANOCOBALAMIN) 1000 MCG tablet Take 1,000 mcg by mouth 2 (two) times a week. On Tuesday and Thursday with Iron.    . vitamin E (VITAMIN E) 1000 UNIT capsule Take 1 two times a week.    . zafirlukast (ACCOLATE) 20 MG tablet Take 1 tablet (20 mg total) by mouth 2 (two) times daily before a meal. 180 tablet 1   No current facility-administered medications for this  visit.     Past Medical History  Diagnosis Date  . Allergic rhinitis   . Asthma   . Hypertension   . Hypothyroidism   . Anemia, iron deficiency   . Vitamin D deficiency   . Morbid obesity   . GERD (gastroesophageal reflux disease)   . Hypoparathyroidism 01/07/2011  . Fatty liver 01/07/2012  . Headache(784.0)     occasional sinus headache   . Atrial fibrillation August 2012    OFF XARELTO LAST MONTH DUE TO BLEEDING IN STOOL  . History of blood transfusion JULY 2015  . Fibroid 1974    fibroid cyst on left fallopian tube  . Abnormal Pap smear     years ago/no biopsy  . Bursitis of hip left  . Edema of both legs   . Enlarged liver   . Iron deficiency anemia   . Foot ulcer     AREA HEALED RIGHT FOOT  . Seizures     infancy secondary to fever  . Nephrolithiasis     SEES DR Risa Grill  . Thyroid cancer 2011    THYROIDECTOMY DONE  . Uterine cancer DX 2014    NO CURRENT TX FOR  . PONV (postoperative nausea and vomiting)   . Pernicious anemia 02/20/2014  . Dysrhythmia     afib, followed by Dr. Stanford Breed   . Arthritis     back- lower  . Fibromyalgia     Past Surgical History  Procedure Laterality Date  . Tonsillectomy and adenoidectomy    . Bunionectomy      bilateral  . Appendectomy    . Gastric bypass  1974  . Thyroidectomy  05/15/2010  . Cyst on ovary removed     . Ureteroscopy  03/03/2012    Procedure: URETEROSCOPY;  Surgeon: Bernestine Amass, MD;  Location: [facility];  Service: Urology;  Laterality: Left;  . Cystoscopy w/ retrogrades  03/03/2012    Procedure: CYSTOSCOPY WITH RETROGRADE PYELOGRAM;  Surgeon: Bernestine Amass, MD;  Location: [facility];  Service: Urology;  Laterality: Bilateral;  . Lithotripsy  03/2012  . Cystoscopy with retrograde pyelogram, ureteroscopy and stent placement Left 08/25/2012    Procedure: CYSTOSCOPY WITH RETROGRADE PYELOGRAM, URETEROSCOPY;  Surgeon: Bernestine Amass, MD;  Location: [facility];  Service: Urology;  Laterality: Left;  . Holmium laser  application Left 12/01/9379    Procedure: HOLMIUM LASER APPLICATION;  Surgeon: Bernestine Amass, MD;  Location: [facility];  Service: Urology;  Laterality: Left;  . Dilation and curettage of uterus N/A 02/21/2013    Procedure: DILATATION AND CURETTAGE;  Surgeon: Lyman Speller, MD;  Location: [facility];  Service: Gynecology;  Laterality: N/A;  . Colonoscopy with propofol N/A 02/02/2014    Procedure:  COLONOSCOPY WITH PROPOFOL;  Surgeon: Irene Shipper, MD;  [facility];  Service: Endoscopy;  Laterality: N/A;  . Esophagogastroduodenoscopy (egd) with propofol N/A 02/02/2014    Procedure: ESOPHAGOGASTRODUODENOSCOPY (EGD) WITH PROPOFOL;  Surgeon: Irene Shipper, MD;  [facility];  Service: Endoscopy;  Laterality: N/A;  . Amputation Right 05/18/2014    Procedure: RIGHT FIFTH RAY AMPUTATION FOOT;  Surgeon: Wylene Simmer, MD;  Location: [place];  Service: Orthopedics;  Laterality: Right;    History   Social History  . Marital Status: Single    Spouse Name: N/A    Number of Children: 0  . Years of Education: N/A   Occupational History  . works in Insurance claims handler   . [employer]    Social History Main Topics  . Smoking status: Former Smoker -- 0.50 packs/day for 25 years    Types: Cigarettes    Start date: 12/24/1970    Quit date: 05/27/1995  . Smokeless tobacco: Never Used     Comment: quit smoking 17 years ago  . Alcohol Use: Yes     Comment: 1/2 glass per month  . Drug Use: No  . Sexual Activity:    Partners: Male   Other Topics Concern  . Not on file   Social History Narrative   Occupation: works in Insurance claims handler - Field seismologist   Single       Former Smoker - quit tobacco 12 years ago.  She was light smoker for 10 years.                 ROS: no fevers or chills, productive cough, hemoptysis, dysphasia, odynophagia, melena, hematochezia, dysuria, hematuria, rash, seizure activity, orthopnea, PND, pedal edema, claudication. Remaining systems are  negative.  Physical Exam: Well-developed obese in no acute distress.  Skin is warm and dry.  HEENT is normal.  Neck is supple.  Chest is clear to auscultation with normal expansion.  Cardiovascular exam is regular rate and rhythm.  Abdominal exam nontender or distended. No masses palpated. Extremities show no edema. neuro grossly intact  ECG sinus rhythm at a rate of 91. Occasional PAC. Nonspecific ST changes.

## 2014-06-14 NOTE — Assessment & Plan Note (Signed)
Patient counseled on weight loss. 

## 2014-06-14 NOTE — Progress Notes (Signed)
Pre visit review using our clinic review tool, if applicable. No additional management support is needed unless otherwise documented below in the visit note. 

## 2014-06-17 ENCOUNTER — Telehealth: Payer: Self-pay | Admitting: Family

## 2014-06-17 DIAGNOSIS — E559 Vitamin D deficiency, unspecified: Secondary | ICD-10-CM

## 2014-06-17 DIAGNOSIS — E538 Deficiency of other specified B group vitamins: Secondary | ICD-10-CM

## 2014-06-17 NOTE — Telephone Encounter (Signed)
Please contact pt and let her know that vit D is low and B12 is low.  Is she taking the vit D 50000 twice weekly?  If so, we should increase to 3 times weekly. And repeat vit d in 3 months. I also think that we should d/c oral b12 and place her on b12 injections 1017mcg IM  Weekly x 4 weeks, then monthly. Repeat b12 level in 3 months, dx b12 deficiency.

## 2014-06-19 ENCOUNTER — Other Ambulatory Visit: Payer: BLUE CROSS/BLUE SHIELD

## 2014-06-20 NOTE — Telephone Encounter (Signed)
Left message for pt to return my call.

## 2014-06-21 NOTE — Telephone Encounter (Signed)
Patient informed, understood & agreed to increase Vit D to Three times weekly, patient however, does Decline to start Vit B12 injections and chooses to increase her OTC usage to BID; pt informed that this information would be forwarded to provider and any further response would be communicated to her/SLS

## 2014-06-21 NOTE — Telephone Encounter (Signed)
Caller name:Meily Alkema Relationship to patient: Self Can be reached:Same as [facility]:  Reason for call:  PT returning TF call

## 2014-06-21 NOTE — Telephone Encounter (Signed)
Noted.She still should repeat b12 level in 3 months to make sure that she is responding to oral b12.

## 2014-06-22 NOTE — Telephone Encounter (Signed)
Pt stated she decided to go with Melissa's recommendations on Vitamin b12 injections.  Injection appointments have been scheduled. Lab appointment scheduled for 09/21/14 @ 9:30 am. Future B12 lab ordered.

## 2014-06-23 ENCOUNTER — Ambulatory Visit (INDEPENDENT_AMBULATORY_CARE_PROVIDER_SITE_OTHER): Payer: BLUE CROSS/BLUE SHIELD | Admitting: *Deleted

## 2014-06-23 DIAGNOSIS — E538 Deficiency of other specified B group vitamins: Secondary | ICD-10-CM

## 2014-06-23 MED ORDER — CYANOCOBALAMIN 1000 MCG/ML IJ SOLN
1000.0000 ug | Freq: Once | INTRAMUSCULAR | Status: AC
  Administered 2014-06-23: 1000 ug via INTRAMUSCULAR

## 2014-06-23 NOTE — Addendum Note (Signed)
Addended by: Kelle Darting A on: 06/23/2014 10:35 AM   Modules accepted: Orders

## 2014-06-23 NOTE — Progress Notes (Signed)
Pre visit review using our clinic review tool, if applicable. No additional management support is needed unless otherwise documented below in the visit note. Patient tolerated injection well.  Next appointment previously scheduled.   eal  

## 2014-06-26 ENCOUNTER — Ambulatory Visit
Admission: RE | Admit: 2014-06-26 | Discharge: 2014-06-26 | Disposition: A | Discharge status: Home / Self Care | Payer: BLUE CROSS/BLUE SHIELD | Source: Ambulatory Visit | Attending: Internal Medicine | Admitting: Internal Medicine

## 2014-06-26 DIAGNOSIS — Z85850 Personal history of malignant neoplasm of thyroid: Secondary | ICD-10-CM

## 2014-06-26 HISTORY — PX: LITHOTRIPSY: SUR834

## 2014-06-30 ENCOUNTER — Ambulatory Visit (INDEPENDENT_AMBULATORY_CARE_PROVIDER_SITE_OTHER): Payer: BLUE CROSS/BLUE SHIELD | Admitting: *Deleted

## 2014-06-30 DIAGNOSIS — E538 Deficiency of other specified B group vitamins: Secondary | ICD-10-CM

## 2014-06-30 MED ORDER — CYANOCOBALAMIN 1000 MCG/ML IJ SOLN
1000.0000 ug | Freq: Once | INTRAMUSCULAR | Status: AC
  Administered 2014-06-30: 1000 ug via INTRAMUSCULAR

## 2014-06-30 NOTE — Progress Notes (Signed)
Pre visit review using our clinic review tool, if applicable. No additional management support is needed unless otherwise documented below in the visit note.  Patient tolerated injection well.  Next visit previously scheduled.

## 2014-07-07 ENCOUNTER — Other Ambulatory Visit: Payer: Self-pay | Admitting: Urology

## 2014-07-07 ENCOUNTER — Other Ambulatory Visit: Payer: Self-pay | Admitting: Family

## 2014-07-07 ENCOUNTER — Ambulatory Visit (INDEPENDENT_AMBULATORY_CARE_PROVIDER_SITE_OTHER): Payer: BLUE CROSS/BLUE SHIELD | Admitting: *Deleted

## 2014-07-07 DIAGNOSIS — E538 Deficiency of other specified B group vitamins: Secondary | ICD-10-CM

## 2014-07-07 MED ORDER — CYANOCOBALAMIN 1000 MCG/ML IJ SOLN
1000.0000 ug | Freq: Once | INTRAMUSCULAR | Status: AC
  Administered 2014-07-07: 1000 ug via INTRAMUSCULAR

## 2014-07-07 NOTE — Telephone Encounter (Signed)
Requesting Tramadol 50mg -Take 1 tablet by mouth every 6 hours as needed for pain. Last refill:06/06/14;#30,0 Last OV:06/14/14 UDS:06/13/14 Please advise.//AB/CMA

## 2014-07-07 NOTE — Progress Notes (Signed)
Pre visit review using our clinic review tool, if applicable. No additional management support is needed unless otherwise documented below in the visit note.  Patient tolerated injection well.  Next appointment previously scheduled.

## 2014-07-08 NOTE — Telephone Encounter (Signed)
Ok to send 30 tabs zero refills. 

## 2014-07-10 ENCOUNTER — Encounter (HOSPITAL_COMMUNITY): Payer: Self-pay | Admitting: General Practice

## 2014-07-10 NOTE — Telephone Encounter (Signed)
Rx called to pharmacy voicemail. 

## 2014-07-14 ENCOUNTER — Ambulatory Visit (INDEPENDENT_AMBULATORY_CARE_PROVIDER_SITE_OTHER): Payer: BLUE CROSS/BLUE SHIELD | Admitting: *Deleted

## 2014-07-14 DIAGNOSIS — E538 Deficiency of other specified B group vitamins: Secondary | ICD-10-CM

## 2014-07-14 MED ORDER — CYANOCOBALAMIN 1000 MCG/ML IJ SOLN
1000.0000 ug | Freq: Once | INTRAMUSCULAR | Status: AC
  Administered 2014-07-14: 1000 ug via INTRAMUSCULAR

## 2014-07-14 NOTE — Progress Notes (Signed)
Pre visit review using our clinic review tool, if applicable. No additional management support is needed unless otherwise documented below in the visit note.  Patient tolerated injection well.  Next injection scheduled for 08/11/14.

## 2014-07-17 ENCOUNTER — Encounter (HOSPITAL_COMMUNITY)
Admission: RE | Disposition: A | Discharge status: Home / Self Care | Payer: Self-pay | Source: Ambulatory Visit | Attending: Urology

## 2014-07-17 ENCOUNTER — Ambulatory Visit (HOSPITAL_COMMUNITY)
Admission: RE | Admit: 2014-07-17 | Discharge: 2014-07-17 | Disposition: A | Discharge status: Home / Self Care | Payer: BLUE CROSS/BLUE SHIELD | Source: Ambulatory Visit | Attending: Urology | Admitting: Urology

## 2014-07-17 ENCOUNTER — Ambulatory Visit (HOSPITAL_COMMUNITY): Payer: BLUE CROSS/BLUE SHIELD

## 2014-07-17 ENCOUNTER — Encounter (HOSPITAL_COMMUNITY): Payer: Self-pay | Admitting: *Deleted

## 2014-07-17 DIAGNOSIS — K210 Gastro-esophageal reflux disease with esophagitis: Secondary | ICD-10-CM | POA: Diagnosis not present

## 2014-07-17 DIAGNOSIS — D500 Iron deficiency anemia secondary to blood loss (chronic): Secondary | ICD-10-CM | POA: Insufficient documentation

## 2014-07-17 DIAGNOSIS — J45909 Unspecified asthma, uncomplicated: Secondary | ICD-10-CM | POA: Insufficient documentation

## 2014-07-17 DIAGNOSIS — M1990 Unspecified osteoarthritis, unspecified site: Secondary | ICD-10-CM | POA: Diagnosis not present

## 2014-07-17 DIAGNOSIS — Z880 Allergy status to penicillin: Secondary | ICD-10-CM | POA: Insufficient documentation

## 2014-07-17 DIAGNOSIS — Z881 Allergy status to other antibiotic agents status: Secondary | ICD-10-CM | POA: Diagnosis not present

## 2014-07-17 DIAGNOSIS — N132 Hydronephrosis with renal and ureteral calculous obstruction: Secondary | ICD-10-CM | POA: Insufficient documentation

## 2014-07-17 DIAGNOSIS — E039 Hypothyroidism, unspecified: Secondary | ICD-10-CM | POA: Insufficient documentation

## 2014-07-17 DIAGNOSIS — Z85850 Personal history of malignant neoplasm of thyroid: Secondary | ICD-10-CM | POA: Diagnosis not present

## 2014-07-17 DIAGNOSIS — E559 Vitamin D deficiency, unspecified: Secondary | ICD-10-CM | POA: Diagnosis not present

## 2014-07-17 DIAGNOSIS — N201 Calculus of ureter: Secondary | ICD-10-CM

## 2014-07-17 DIAGNOSIS — Z9884 Bariatric surgery status: Secondary | ICD-10-CM | POA: Diagnosis not present

## 2014-07-17 DIAGNOSIS — Z9049 Acquired absence of other specified parts of digestive tract: Secondary | ICD-10-CM | POA: Diagnosis not present

## 2014-07-17 DIAGNOSIS — Z87891 Personal history of nicotine dependence: Secondary | ICD-10-CM | POA: Insufficient documentation

## 2014-07-17 DIAGNOSIS — Z888 Allergy status to other drugs, medicaments and biological substances status: Secondary | ICD-10-CM | POA: Diagnosis not present

## 2014-07-17 DIAGNOSIS — Z6841 Body Mass Index (BMI) 40.0 and over, adult: Secondary | ICD-10-CM | POA: Diagnosis not present

## 2014-07-17 DIAGNOSIS — I10 Essential (primary) hypertension: Secondary | ICD-10-CM | POA: Diagnosis not present

## 2014-07-17 DIAGNOSIS — Z8542 Personal history of malignant neoplasm of other parts of uterus: Secondary | ICD-10-CM | POA: Insufficient documentation

## 2014-07-17 DIAGNOSIS — I4891 Unspecified atrial fibrillation: Secondary | ICD-10-CM | POA: Insufficient documentation

## 2014-07-17 LAB — BASIC METABOLIC PANEL
ANION GAP: 12 (ref 5–15)
BUN: 20 mg/dL (ref 6–23)
CO2: 27 mmol/L (ref 19–32)
Calcium: 8.8 mg/dL (ref 8.4–10.5)
Chloride: 100 mmol/L (ref 96–112)
Creatinine, Ser: 1.05 mg/dL (ref 0.50–1.10)
GFR calc Af Amer: 67 mL/min — ABNORMAL LOW (ref >90)
GFR, EST NON AFRICAN AMERICAN: 58 mL/min — AB (ref >90)
GLUCOSE: 101 mg/dL — AB (ref 70–99)
Potassium: 3.3 mmol/L — ABNORMAL LOW (ref 3.5–5.1)
Sodium: 139 mmol/L (ref 135–145)

## 2014-07-17 SURGERY — LITHOTRIPSY, ESWL
Anesthesia: LOCAL | Laterality: Right

## 2014-07-17 MED ORDER — CIPROFLOXACIN IN D5W 400 MG/200ML IV SOLN
400.0000 mg | INTRAVENOUS | Status: AC
  Administered 2014-07-17: 400 mg via INTRAVENOUS
  Filled 2014-07-17: qty 200

## 2014-07-17 MED ORDER — DEXTROSE-NACL 5-0.45 % IV SOLN
INTRAVENOUS | Status: DC
  Administered 2014-07-17: 10:00:00 via INTRAVENOUS

## 2014-07-17 MED ORDER — DIPHENHYDRAMINE HCL 25 MG PO CAPS
25.0000 mg | ORAL_CAPSULE | ORAL | Status: AC
  Administered 2014-07-17: 25 mg via ORAL
  Filled 2014-07-17: qty 1

## 2014-07-17 MED ORDER — DIAZEPAM 5 MG PO TABS
10.0000 mg | ORAL_TABLET | ORAL | Status: AC
  Administered 2014-07-17: 10 mg via ORAL
  Filled 2014-07-17: qty 2

## 2014-07-17 NOTE — H&P (Signed)
History of Present Illness     Justeen presents today for routine followup. She has a longstanding history of nephrolithiasis and has been a fairly prolific stone former. She does have a prior history of intestinal bypass surgery and previous metabolic studies have shown very low-levels of urinary citrate and high oxalate levels. We had her on citrate replacement therapy, but she recently stopped the citrate tablets due to some esophageal irritation. She was breaking the tablets up, which they are not designed to do. Since we last saw her, she estimates passing 8-10 stones. The majority of these occurred before Christmas. She has passed at least 1 stone in the last couple of months and believes that was on the left side. She is currently not having any flank or abdominal discomfort. She has complained of increased lethargy the last couple of weeks. She does have a prior history of chronic anemia.  On urinalysis today, she does have significant pyuria and bacteruria suspicious for some lingering chronic cystitis.   On [facility] imaging today, the right kidney is somewhat obscured by bowel gas. There is not a significant number of definitive stones in the right kidney. The right kidney does show at least moderate hydronephrosis however. There is a very suspicious 8 mm x 10 mm calcification in the right mid ureter and a question of a second, more faint, calcification proximal to that. In the left kidney, there are 2 large linear stones measuring around 7 mm x 12 mm in the lower pole. Question some smaller stones in the mid pole and upper pole.   Renal ultrasound: right kidney measures 11.5 cm. There is moderate hydronephrosis. No obvious stones within the right kidney. No evidence of solid renal mass or cyst. The left kidney is without hydronephrosis. There are multiple stones in the lower pole of the left kidney with questionable smaller stones in the mid to upper pole. Again, no evidence of renal mass or renal  cyst.       Past Medical History Problems  1. History of Arthritis 2. History of Asthma (J45.909) 3. History of Atrial fibrillation (I48.91) 4. History of Chronic Reflux Esophagitis 5. Former smoker [phone]) 6. History of allergic rhinitis (Z87.09) 7. History of hematuria (Z87.448) 8. History of hypertension (Z86.79) 9. History of hypothyroidism (Z86.39) 10. History of low back pain (Z87.39) 11. History of Hypoparathyroidism 12. History of Iron Deficiency Anemia Secondary To Chronic Blood Loss 13. History of Morbid obesity (E66.01) 14. History of Thyroid Cancer 15. History of Uterine Cancer 16. History of Vitamin D deficiency (E55.9)  Surgical History Problems  1. History of Appendectomy 2. History of Bunion Correction With Metatarsal Osteotomy [place] Procedure 3. History of Cystoscopy With Insertion Of Ureteral Stent Left 4. History of Cystoscopy With Ureteroscopy With Lithotripsy 5. History of Cystoscopy With Ureteroscopy With Lithotripsy 6. History of Foot Surgery 7. History of High Gastric Bypass 8. History of Lithotripsy 9. History of Thyroid Surgery 10. History of Tonsillectomy With Adenoidectomy  Current Meds 1. Albuterol Sulfate (2.5 MG/3ML) 0.083% Inhalation Nebulization Solution;  Therapy: (Recorded:14Aug2013) to Recorded 2. Calcitriol 0.5 MCG Oral Capsule;  Therapy: 02Dec2013 to Recorded 3. Cyclobenzaprine HCl - 5 MG Oral Tablet;  Therapy: [phone] to Recorded 4. Diltiazem HCl ER Coated Beads 240 MG Oral Capsule Extended Release 24 Hour;  Therapy: [id number] to Recorded 5. Diovan 80 MG Oral Tablet;  Therapy: (Recorded:25Mar2014) to Recorded 6. Estrace 0.1 MG/GM Vaginal Cream;  Therapy: 06Oct2014 to Recorded 7. Fluticasone Propionate 50 MCG/ACT Nasal Suspension;  Therapy: 07Aug2013  to Recorded 8. Furosemide 40 MG Oral Tablet;  Therapy: [phone] to Recorded 9. Hydrochlorothiazide 25 MG Oral Tablet;  Therapy: (Recorded:25Mar2014) to Recorded 10.  Hydrocodone-Acetaminophen 7.5-325 MG Oral Tablet; TAKE 1 TABLET 3 TIMES DAILY AS   NEEDED FOR PAIN;   Therapy: 21Apr2014 to (Last Rx:24Sep2015) Ordered 11. Klor-Con M20 20 MEQ Oral Tablet Extended Release;   Therapy: [id number] to Recorded 12. Levothyroxine Sodium 137 MCG Oral Tablet;   Therapy: 31Oct2013 to Recorded 13. Symbicort 160-4.5 MCG/ACT Inhalation Aerosol;   Therapy: 31Oct2013 to Recorded 14. TraMADol HCl - 50 MG Oral Tablet;   Therapy: [id number] to Recorded 15. Xarelto 20 MG Oral Tablet;   Therapy: [id number] to Recorded 16. Zafirlukast 20 MG Oral Tablet;   Therapy: 12Sep2013 to Recorded  Allergies Medication  1. Allergen SOLN 2. Amoxicillin CAPS 3. Penicillins  Family History Problems  1. Family history of No Significant Family History  Social History Problems  1. Alcohol Use 2. Tobacco smoking status unknown  Review of Systems  Genitourinary: no urinary frequency, no dysuria and no hematuria.  Gastrointestinal: no nausea.  Constitutional: feeling tired (fatigue).  Integumentary: pruritus.  Hematologic/Lymphatic: a tendency to easily bruise.  Musculoskeletal: back pain.  Neurological: dizziness and headache.    Vitals Vital Signs [Data Includes: Last 1 Day]  Recorded: [id number] 02:25PM  Blood Pressure: 145 / 85 Temperature: 98.2 F Heart Rate: 102  Physical Exam Constitutional: Well nourished and well developed . No acute distress.  ENT:. The ears and nose are normal in appearance.  Pulmonary: No respiratory distress and normal respiratory rhythm and effort.  Abdomen: The abdomen is soft and nontender. No masses are palpated. No CVA tenderness. No hernias are palpable. No hepatosplenomegaly noted.    Assessment Assessed  1. Nephrolithiasis (N20.0) 2. Hydronephrosis (N13.30) 3. Calculus of ureter (N20.1) 4. Pyuria (N39.0)  Plan Pyuria  1. Start: Ciprofloxacin HCl - 250 MG Oral Tablet; TAKE 1 TABLET BID 2. Start: Potassium Citrate-Citric Acid  1100-334 MG/5ML Oral Solution; TAKE 15 ML Every  twelve hours 3. Follow-up Schedule Surgery Office  Follow-up  Status: Hold For - Appointment   Requested for: [id number] 4. URINE CULTURE; Status:Resulted - Requires Verification;   Done: [id number] 02:51PM  Discussion/Summary     There are several issues we discussed with Reniyah today. She is relatively asymptomatic aside from some generalized fatigue. Urine today is very suspicious for potential cystitis, although much of her pyuria could be related to the stone burden, especially the stone on the right side. She is currently nontoxic and afebrile. If she does develop a fever or chill, she will need to contact the physician on-call ASAP. I would normally hold off putting her on antibiotics, but given the stone and pyuria, I think it would be better to go ahead and empirically get her started on some ciprofloxacin while we await her culture data. She does to need anything urgent done for her right ureteral stone. This stone is unlikely to pass spontaneously, given its size, and it is really hard to know how long Tarren has had this stone, since she is asymptomatic. There is moderate hydronephrosis. This is a stone that can be treated with either ureteroscopy or shockwave lithotripsy. There are advantages and disadvantages to both. After discussing some of these issues, she has elected to try to have this treated with shockwave lithotripsy. If the stone does migrate more distally over the bony structure, it may be difficult to see and then we may have to change our tactics. We will definitely  need to make sure that any urinary tract infection is treated adequately before any type of stone manipulation or treatment. We are going to have to get an alternative form of citrate replacement for her because we want to continue her on that as a stone prevention strategy.

## 2014-07-17 NOTE — Interval H&P Note (Signed)
History and Physical Interval Note:  07/17/2014 11:07 AM  [name]  has presented today for surgery, with the diagnosis of RIGHT URETERAL CALCULUS  The various methods of treatment have been discussed with the patient and family. After consideration of risks, benefits and other options for treatment, the patient has consented to  Procedure(s): RIGHT EXTRACORPOREAL SHOCK WAVE LITHOTRIPSY (ESWL) (Right) as a surgical intervention .  The patient's history has been reviewed, patient examined, no change in status, stable for surgery.  I have reviewed the patient's chart and labs.  Questions were answered to the patient's satisfaction.     Brindy Higginbotham S

## 2014-07-17 NOTE — Op Note (Signed)
See [facility] OP note scanned into chart. 

## 2014-07-17 NOTE — Discharge Instructions (Addendum)
See [facility] discharge instructions in chart.  

## 2014-08-07 ENCOUNTER — Other Ambulatory Visit: Payer: Self-pay | Admitting: Family

## 2014-08-07 NOTE — Telephone Encounter (Signed)
Medication name:  Name from pharmacy:  traMADol (ULTRAM) 50 MG tablet TRAMADOL 50MG  TABLETS    Sig: TAKE 1 TABLET BY MOUTH EVERY 6 HOURS AS NEEDED FOR PAIN    Dispense: 30 tablet   Refills: 0   Start: 08/07/2014   Class: Normal    Requested on: 08/07/2014    Originally ordered on: 05/05/2014 07/10/2014       Last Rx provided 07/10/14, #30. Pt due for follow up 11/2014.  Please advise.

## 2014-08-07 NOTE — Telephone Encounter (Signed)
Ok to send 30 tabs zero refills. 

## 2014-08-08 NOTE — Telephone Encounter (Signed)
Rx called to pharmacy voicemail. 

## 2014-08-11 ENCOUNTER — Ambulatory Visit (INDEPENDENT_AMBULATORY_CARE_PROVIDER_SITE_OTHER): Payer: BLUE CROSS/BLUE SHIELD | Admitting: *Deleted

## 2014-08-11 DIAGNOSIS — E538 Deficiency of other specified B group vitamins: Secondary | ICD-10-CM

## 2014-08-11 MED ORDER — CYANOCOBALAMIN 1000 MCG/ML IJ SOLN
1000.0000 ug | Freq: Once | INTRAMUSCULAR | Status: AC
  Administered 2014-08-11: 1000 ug via INTRAMUSCULAR

## 2014-08-11 NOTE — Progress Notes (Signed)
Pre visit review using our clinic review tool, if applicable. No additional management support is needed unless otherwise documented below in the visit note.  Patient tolerated injection well.  Next injection scheduled for 09/12/14.

## 2014-08-16 ENCOUNTER — Other Ambulatory Visit (HOSPITAL_BASED_OUTPATIENT_CLINIC_OR_DEPARTMENT_OTHER): Payer: BLUE CROSS/BLUE SHIELD | Admitting: Lab

## 2014-08-16 ENCOUNTER — Encounter: Payer: Self-pay | Admitting: Hematology & Oncology

## 2014-08-16 ENCOUNTER — Other Ambulatory Visit: Payer: Self-pay | Admitting: Family

## 2014-08-16 ENCOUNTER — Ambulatory Visit (HOSPITAL_BASED_OUTPATIENT_CLINIC_OR_DEPARTMENT_OTHER): Payer: BLUE CROSS/BLUE SHIELD | Admitting: Hematology & Oncology

## 2014-08-16 VITALS — BP 151/73 | HR 91 | Temp 98.0°F | Resp 16 | Ht 64.0 in | Wt 310.0 lb

## 2014-08-16 DIAGNOSIS — D510 Vitamin B12 deficiency anemia due to intrinsic factor deficiency: Secondary | ICD-10-CM | POA: Diagnosis not present

## 2014-08-16 DIAGNOSIS — D509 Iron deficiency anemia, unspecified: Secondary | ICD-10-CM | POA: Diagnosis not present

## 2014-08-16 LAB — CBC WITH DIFFERENTIAL (CANCER CENTER ONLY)
BASO#: 0 10*3/uL (ref 0.0–0.2)
BASO%: 0.4 % (ref 0.0–2.0)
EOS%: 2 % (ref 0.0–7.0)
Eosinophils Absolute: 0.2 10*3/uL (ref 0.0–0.5)
HCT: 38.7 % (ref 34.8–46.6)
HGB: 12.6 g/dL (ref 11.6–15.9)
LYMPH#: 1.7 10*3/uL (ref 0.9–3.3)
LYMPH%: 17.7 % (ref 14.0–48.0)
MCH: 27.9 pg (ref 26.0–34.0)
MCHC: 32.6 g/dL (ref 32.0–36.0)
MCV: 86 fL (ref 81–101)
MONO#: 0.5 10*3/uL (ref 0.1–0.9)
MONO%: 5.3 % (ref 0.0–13.0)
NEUT#: 7.2 10*3/uL — ABNORMAL HIGH (ref 1.5–6.5)
NEUT%: 74.6 % (ref 39.6–80.0)
PLATELETS: 190 10*3/uL (ref 145–400)
RBC: 4.52 10*6/uL (ref 3.70–5.32)
RDW: 14.4 % (ref 11.1–15.7)
WBC: 9.7 10*3/uL (ref 3.9–10.0)

## 2014-08-16 LAB — RETICULOCYTES (CHCC)
ABS Retic: 104.2 10*3/uL (ref 19.0–186.0)
RBC.: 4.53 MIL/uL (ref 3.87–5.11)
Retic Ct Pct: 2.3 % (ref 0.4–2.3)

## 2014-08-16 LAB — IRON AND TIBC CHCC
%SAT: 22 % (ref 21–57)
Iron: 53 ug/dL (ref 41–142)
TIBC: 244 ug/dL (ref 236–444)
UIBC: 191 ug/dL (ref 120–384)

## 2014-08-16 LAB — FERRITIN CHCC: Ferritin: 954 ng/ml — ABNORMAL HIGH (ref 9–269)

## 2014-08-16 LAB — CHCC SATELLITE - SMEAR

## 2014-08-16 NOTE — Telephone Encounter (Signed)
Medication Detail      Disp Refills Start End     SYMBICORT 160-4.5 MCG/ACT inhaler 10.2 g 2 04/03/2014     Sig: INHALE 2 PUFFS BY MOUTH TWICE DAILY    E-Prescribing Status: Receipt confirmed by pharmacy (04/03/2014 1:29 PM EST)      Rx request to pharmacy/SLS

## 2014-08-16 NOTE — Progress Notes (Signed)
Hematology and Oncology Follow Up Visit  [name] [id number] [birth year] 59 y.o. 08/16/2014   Principle Diagnosis:  Iron deficiency anemia  Current Therapy:    IV iron as indicated     Interim History:  Ms.  [name] is back for a visit. She's doing okay. Last received iron back in November. Patient no problems over the winter. She had no bronchitis.  There has been no change in her medications. She's had no bleeding or bruising.  Her appetite has been okay.   She is getting around with a cane right now. She is still working. Medications:  Current outpatient prescriptions:  .  albuterol (PROVENTIL HFA;VENTOLIN HFA) 108 (90 BASE) MCG/ACT inhaler, Inhale 2 puffs into the lungs every 6 (six) hours as needed for wheezing or shortness of breath., Disp: , Rfl:  .  albuterol (PROVENTIL) (2.5 MG/3ML) 0.083% nebulizer solution, Take 2.5 mg by nebulization every 6 (six) hours as needed for wheezing or shortness of breath., Disp: , Rfl:  .  Ascorbic Acid (VITAMIN C CR) 1000 MG TBCR, Take 1 tablet by mouth 2 (two) times a week. On Tuesday and Thursday., Disp: , Rfl:  .  aspirin EC 81 MG tablet, Take 1 tablet (81 mg total) by mouth daily., Disp: 90 tablet, Rfl: 3 .  BLACK COHOSH PO, Take 60 mg by mouth daily., Disp: , Rfl:  .  calcitRIOL (ROCALTROL) 0.5 MCG capsule, Take 1.5 mcg by mouth daily. , Disp: , Rfl:  .  calcium carbonate (TUMS - DOSED IN MG ELEMENTAL CALCIUM) 500 MG chewable tablet, Chew 1 tablet by mouth daily. , Disp: , Rfl:  .  cyanocobalamin (,VITAMIN B-12,) 1000 MCG/ML injection, 1069mcg IM once weekly x 4 weeks, then once monthly, Disp: , Rfl:  .  cyclobenzaprine (FLEXERIL) 10 MG tablet, Take 10 mg by mouth at bedtime., Disp: , Rfl:  .  diltiazem (CARTIA XT) 240 MG 24 hr capsule, Take 1 capsule (240 mg total) by mouth daily., Disp: 90 capsule, Rfl: 3 .  DIPHENHYDRAMINE-PSEUDOEPHED PO, Take 1 tablet by mouth every 6 (six) hours as needed (for allegies)., Disp: , Rfl:  .   ergocalciferol (VITAMIN D2) 50000 UNITS capsule, Take 50,000 Units by mouth daily. On Saturday,monday and Wednesday, Disp: , Rfl:  .  ESTRACE VAGINAL 0.1 MG/GM vaginal cream, APPLY EXTERNALLY VAGINALLY 2 TO 3 TIMES PER WEEK, Disp: 42.5 g, Rfl: 1 .  fluticasone (FLONASE) 50 MCG/ACT nasal spray, Place 2 sprays into both nostrils daily as needed for allergies or rhinitis. , Disp: , Rfl:  .  furosemide (LASIX) 40 MG tablet, Take 1 tablet (40 mg total) by mouth daily., Disp: 90 tablet, Rfl: 1 .  hydrochlorothiazide (HYDRODIURIL) 25 MG tablet, Take 1 tablet (25 mg total) by mouth daily., Disp: 90 tablet, Rfl: 1 .  iron polysaccharides (NU-IRON) 150 MG capsule, Take 150 mg by mouth 2 (two) times a week. , Disp: , Rfl:  .  levothyroxine (SYNTHROID, LEVOTHROID) 137 MCG tablet, Take 411 mcg by mouth daily. Takes 3 tabs in AM, Disp: , Rfl:  .  loratadine (CLARITIN) 10 MG tablet, Take 10 mg by mouth daily., Disp: , Rfl:  .  MANGANESE PO, Take 1,250 mg by mouth 2 (two) times daily., Disp: , Rfl:  .  methocarbamol (ROBAXIN) 500 MG tablet, Take 500 mg by mouth 2 (two) times daily., Disp: , Rfl:  .  nystatin cream (MYCOSTATIN), Apply 1 application topically 2 (two) times daily as needed for dry skin. , Disp: , Rfl:  .  omeprazole (PRILOSEC) 40 MG capsule, Take 1 capsule (40 mg total) by mouth daily., Disp: 90 capsule, Rfl: 1 .  SYMBICORT 160-4.5 MCG/ACT inhaler, INHALE 2 PUFFS BY MOUTH TWICE DAILY, Disp: 10.2 g, Rfl: 2 .  traMADol (ULTRAM) 50 MG tablet, TAKE 1 TABLET BY MOUTH EVERY 6 HOURS AS NEEDED FOR PAIN, Disp: 30 tablet, Rfl: 0 .  valsartan (DIOVAN) 80 MG tablet, TAKE 1 TABLET BY MOUTH EVERY DAY, Disp: 30 tablet, Rfl: 0 .  vitamin B-12 (CYANOCOBALAMIN) 1000 MCG tablet, Take 1,000 mcg by mouth daily., Disp: , Rfl:  .  zafirlukast (ACCOLATE) 20 MG tablet, Take 1 tablet (20 mg total) by mouth 2 (two) times daily before a meal., Disp: 180 tablet, Rfl: 1  Allergies:  Allergies  Allergen Reactions  . Other  Anaphylaxis    NUTS  . Amoxicillin-Pot Clavulanate Other (See Comments)    headache  . Food Hives    Potato  . Penicillins Other (See Comments)    headache  . Tomato Hives    Past Medical History, Surgical history, Social history, and Family History were reviewed and updated.  Review of Systems: As above  Physical Exam:  height is 5\' 4"  (1.626 m) and weight is 310 lb (140.615 kg). Her oral temperature is 98 F (36.7 C). Her blood pressure is 151/73 and her pulse is 91. Her respiration is 16.   Obese white female no obvious distress. Head and exam has no ocular or oral lesions. There are no palpable cervical or supraclavicular lymph nodes. Lungs are clear. Cardiac exam regular rate and rhythm with no murmurs, rubs or bruits. Abdomen is soft. She is obese. She has good bowel sounds. There is no fluid wave. There is no palpable liver or spleen tip. Extremities shows no clubbing, cyanosis or edema. Skin exam no rashes, ecchymosis or petechia.  Lab Results  Component Value Date   WBC 9.7 08/16/2014   HGB 12.6 08/16/2014   HCT 38.7 08/16/2014   MCV 86 08/16/2014   PLT 190 08/16/2014     Chemistry      Component Value Date/Time   NA 139 07/17/2014 0942   K 3.3* 07/17/2014 0942   CL 100 07/17/2014 0942   CO2 27 07/17/2014 0942   BUN 20 07/17/2014 0942   CREATININE 1.05 07/17/2014 0942   CREATININE 0.80 11/23/2013 0859      Component Value Date/Time   CALCIUM 8.8 07/17/2014 0942   CALCIUM 8.0* 05/27/2010 1855   ALKPHOS 98 06/12/2010 1356   AST 23 06/12/2010 1356   ALT 27 06/12/2010 1356   BILITOT 0.3 06/12/2010 1356         Impression and Plan: Ms. [name] is 25 white female. She's had a history of gastric bypass. She is holding steady with respect to her anemia. She's not anemic. We will see what her iron studies show.  We will we will plan to see her back in about 3 months.   Volanda Napoleon, MD 3/23/20169:49 AM

## 2014-08-25 ENCOUNTER — Encounter: Payer: Self-pay | Admitting: Obstetrics & Gynecology

## 2014-08-25 ENCOUNTER — Ambulatory Visit (INDEPENDENT_AMBULATORY_CARE_PROVIDER_SITE_OTHER): Payer: BLUE CROSS/BLUE SHIELD | Admitting: Obstetrics & Gynecology

## 2014-08-25 VITALS — BP 122/68 | HR 72 | Resp 30 | Ht 65.0 in | Wt 307.4 lb

## 2014-08-25 DIAGNOSIS — C541 Malignant neoplasm of endometrium: Secondary | ICD-10-CM | POA: Diagnosis not present

## 2014-08-25 DIAGNOSIS — Z01419 Encounter for gynecological examination (general) (routine) without abnormal findings: Secondary | ICD-10-CM | POA: Diagnosis not present

## 2014-08-25 DIAGNOSIS — N950 Postmenopausal bleeding: Secondary | ICD-10-CM

## 2014-08-25 DIAGNOSIS — Z124 Encounter for screening for malignant neoplasm of cervix: Secondary | ICD-10-CM

## 2014-08-25 NOTE — Progress Notes (Signed)
59 y.o. G0P0000 SingleCaucasianF here for annual exam.  Pt has complicated medical hx due to morbid obesity, endometrial cancer diagnosed on endometrial biopsy 01/03/13 being treated with Mirena IUD use, a fib, and poor compliance.   After initial endometrial cancer diagnosis, pt was seen Dr. Carlena Bjornstad.  Due to co-morbidities, he felt Mirena IUD was best option.  MRI of abdomen and pelvis was done 01/31/14 and showed 10 mm endometrial thickness with focal expansion of the anterior junctional zone up to about 11 mm.  IUD was placed 02/28/13 with Dr. Wayland Salinas.  Pt never followed up with him.  I saw her 9/15 due to continued PMP spotting after IUD placement.  Repeat endometrial biopsy was performed showing benign inactive endometrium.  I personally spoke with Dr. Wayland Salinas who advised repeat MRI was not necessary.  My office personally communicated with Dr. Serita Grit office and we were told pt would be contacted for follow up apt with Gyn/onc.  Pt reports she was never called.    Pt here for AEX.  Pt reports she continues to spot.  It is light and just at night.  No pelvic pain.    Major issue with kidney stones since I saw her last.  Urology:  Dr. Risa Grill.  Had has lithotripsy since I last saw her.  Has had iron infusion with Dr. Marin Olp and iron studies and hb are much improved.    Another issue that occurred was an infection in her toe due to friction.  Had fifth metatarsal removed by Dr. Doran Durand.   Patient's last menstrual period was 05/26/2009.          Sexually active: Yes The current method of family planning is IUD   Exercising: Yes-walking Smoker:  Former smoker  Health Maintenance: Pap:  01/03/13 WNL/negative HR HPV h/o endometrial cancer, IUD placed 9/14 History of abnormal Pap:  yes MMG:  2011.  Declines additional MMGs at this point. Colonoscopy:  9/15-repeat in 10 years.  Endoscopy and colonoscopy were done. BMD:   none TDaP:  02/15/13 Screening Labs: none today, Hb today: had recent iron  infusion, Urine today:   reports that she quit smoking about 19 years ago. Her smoking use included Cigarettes. She started smoking about 43 years ago. She has a 12.5 pack-year smoking history. She has never used smokeless tobacco. She reports that she drinks alcohol. She reports that she does not use illicit drugs.  Past Medical History  Diagnosis Date  . Allergic rhinitis   . Asthma   . Hypertension   . Hypothyroidism   . Anemia, iron deficiency   . Vitamin D deficiency   . Morbid obesity   . GERD (gastroesophageal reflux disease)   . Hypoparathyroidism 01/07/2011  . Fatty liver 01/07/2012  . Headache(784.0)     occasional sinus headache   . Atrial fibrillation August 2012    OFF XARELTO LAST MONTH DUE TO BLEEDING IN STOOL  . History of blood transfusion JULY 2015  . Fibroid 1974    fibroid cyst on left fallopian tube  . Abnormal Pap smear     years ago/no biopsy  . Bursitis of hip left  . Edema of both legs   . Enlarged liver   . Iron deficiency anemia   . Foot ulcer     AREA HEALED RIGHT FOOT  . Seizures     infancy secondary to fever  . Nephrolithiasis     SEES DR Risa Grill  . Thyroid cancer 2011    THYROIDECTOMY DONE  .  Uterine cancer DX 2014    NO CURRENT TX FOR  . PONV (postoperative nausea and vomiting)   . Pernicious anemia 02/20/2014  . Dysrhythmia     afib, followed by Dr. Stanford Breed   . Arthritis     back- lower  . Fibromyalgia   . Renal stones 2/16    Past Surgical History  Procedure Laterality Date  . Tonsillectomy and adenoidectomy    . Bunionectomy      bilateral  . Appendectomy    . Gastric bypass  1974  . Thyroidectomy  05/15/2010  . Cyst on ovary removed     . Ureteroscopy  03/03/2012    Procedure: URETEROSCOPY;  Surgeon: Bernestine Amass, MD;  Location: [facility];  Service: Urology;  Laterality: Left;  . Cystoscopy w/ retrogrades  03/03/2012    Procedure: CYSTOSCOPY WITH RETROGRADE PYELOGRAM;  Surgeon: Bernestine Amass, MD;  Location: [facility];  Service:  Urology;  Laterality: Bilateral;  . Lithotripsy  03/2012  . Cystoscopy with retrograde pyelogram, ureteroscopy and stent placement Left 08/25/2012    Procedure: CYSTOSCOPY WITH RETROGRADE PYELOGRAM, URETEROSCOPY;  Surgeon: Bernestine Amass, MD;  Location: [facility];  Service: Urology;  Laterality: Left;  . Holmium laser application Left 12/26/9935    Procedure: HOLMIUM LASER APPLICATION;  Surgeon: Bernestine Amass, MD;  Location: [facility];  Service: Urology;  Laterality: Left;  . Dilation and curettage of uterus N/A 02/21/2013    Procedure: DILATATION AND CURETTAGE;  Surgeon: Lyman Speller, MD;  Location: [facility];  Service: Gynecology;  Laterality: N/A;  . Colonoscopy with propofol N/A 02/02/2014    Procedure: COLONOSCOPY WITH PROPOFOL;  Surgeon: Irene Shipper, MD;  Location: [facility];  Service: Endoscopy;  Laterality: N/A;  . Esophagogastroduodenoscopy (egd) with propofol N/A 02/02/2014    Procedure: ESOPHAGOGASTRODUODENOSCOPY (EGD) WITH PROPOFOL;  Surgeon: Irene Shipper, MD;  Location: [facility];  Service: Endoscopy;  Laterality: N/A;  . Amputation Right 05/18/2014    Procedure: RIGHT FIFTH RAY AMPUTATION FOOT;  Surgeon: Wylene Simmer, MD;  Location: [facility];  Service: Orthopedics;  Laterality: Right;  . Lithotripsy  2/16    Current Outpatient Prescriptions  Medication Sig Dispense Refill  . albuterol (PROVENTIL HFA;VENTOLIN HFA) 108 (90 BASE) MCG/ACT inhaler Inhale 2 puffs into the lungs every 6 (six) hours as needed for wheezing or shortness of breath.    [name] albuterol (PROVENTIL) (2.5 MG/3ML) 0.083% nebulizer solution Take 2.5 mg by nebulization every 6 (six) hours as needed for wheezing or shortness of breath.    . Ascorbic Acid (VITAMIN C CR) 1000 MG TBCR Take 1 tablet by mouth 2 (two) times a week. On Tuesday and Thursday.    [name] aspirin EC 81 MG tablet Take 1 tablet (81 mg total) by mouth daily. 90 tablet 3  . BLACK COHOSH PO Take 60 mg by mouth daily.    . calcitRIOL (ROCALTROL) 0.5 MCG capsule  Take 1.5 mcg by mouth daily.     . calcium carbonate (TUMS - DOSED IN MG ELEMENTAL CALCIUM) 500 MG chewable tablet Chew 1 tablet by mouth daily.     . cyanocobalamin (,VITAMIN B-12,) 1000 MCG/ML injection 1046mcg IM once weekly x 4 weeks, then once monthly    . cyclobenzaprine (FLEXERIL) 10 MG tablet Take 10 mg by mouth at bedtime.    [name] diltiazem (CARTIA XT) 240 MG 24 hr capsule Take 1 capsule (240 mg total) by mouth daily. 90 capsule 3  . DIPHENHYDRAMINE-PSEUDOEPHED PO Take 1 tablet by mouth  every 6 (six) hours as needed (for allegies).    . ergocalciferol (VITAMIN D2) 50000 UNITS capsule Take 50,000 Units by mouth daily. Taking daily    . ESTRACE VAGINAL 0.1 MG/GM vaginal cream APPLY EXTERNALLY VAGINALLY 2 TO 3 TIMES PER WEEK 42.5 g 1  . fluticasone (FLONASE) 50 MCG/ACT nasal spray Place 2 sprays into both nostrils daily as needed for allergies or rhinitis.     . furosemide (LASIX) 40 MG tablet Take 1 tablet (40 mg total) by mouth daily. 90 tablet 1  . hydrochlorothiazide (HYDRODIURIL) 25 MG tablet Take 1 tablet (25 mg total) by mouth daily. 90 tablet 1  . iron polysaccharides (NU-IRON) 150 MG capsule Take 150 mg by mouth 2 (two) times a week.     . levothyroxine (SYNTHROID, LEVOTHROID) 137 MCG tablet Take 411 mcg by mouth daily. Takes 3 tabs in AM    . loratadine (CLARITIN) 10 MG tablet Take 10 mg by mouth daily.    [name] MANGANESE PO Take 1,250 mg by mouth 2 (two) times daily.    . methocarbamol (ROBAXIN) 500 MG tablet Take 500 mg by mouth 2 (two) times daily.    [name] nystatin cream (MYCOSTATIN) Apply 1 application topically 2 (two) times daily as needed for dry skin.     [name] omeprazole (PRILOSEC) 40 MG capsule Take 1 capsule (40 mg total) by mouth daily. 90 capsule 1  . SYMBICORT 160-4.5 MCG/ACT inhaler INHALE 2 PUFFS BY MOUTH TWICE DAILY 10.2 g 1  . traMADol (ULTRAM) 50 MG tablet TAKE 1 TABLET BY MOUTH EVERY 6 HOURS AS NEEDED FOR PAIN 30 tablet 0  . valsartan (DIOVAN) 80 MG tablet TAKE 1 TABLET BY  MOUTH EVERY DAY 30 tablet 0  . VIRTRATE-K 1100-334 MG/5ML solution   0  . vitamin B-12 (CYANOCOBALAMIN) 1000 MCG tablet Take 1,000 mcg by mouth daily.    . zafirlukast (ACCOLATE) 20 MG tablet Take 1 tablet (20 mg total) by mouth 2 (two) times daily before a meal. 180 tablet 1   No current facility-administered medications for this visit.    Family History  Problem Relation Age of Onset  . Hypertension Father   . Diabetes Father   . Lung cancer Father   . Heart attack Father     MI at age 45  . Hypertension Mother   . Hyperthyroidism Mother   . Heart disease Mother   . Heart attack Mother     MI at age 40  . Asthma Brother   . Hypertension Brother     younger  . Heart disease Brother     older  . Colon cancer Neg Hx   . Esophageal cancer Neg Hx   . Stomach cancer Neg Hx   . Kidney disease Neg Hx   . Liver disease Neg Hx     ROS:  Pertinent items are noted in HPI.  Otherwise, a comprehensive ROS was negative.  Exam:   General appearance: alert, cooperative and appears stated age Head: Normocephalic, without obvious abnormality, atraumatic Neck: no adenopathy, supple, symmetrical, trachea midline and thyroid normal to inspection and palpation Lungs: clear to auscultation bilaterally Breasts: normal appearance, no masses or tenderness Heart: regular rate and rhythm Abdomen: soft, non-tender; bowel sounds normal; no masses,  no organomegaly Extremities: extremities normal, atraumatic, no cyanosis or edema Skin: Skin color, texture, turgor normal. No rashes or lesions Lymph nodes: Cervical, supraclavicular, and axillary nodes normal. No abnormal inguinal nodes palpated Neurologic: Grossly normal   Pelvic: External genitalia:  no lesions              Urethra:  normal appearing urethra with no masses, tenderness or lesions              Bartholins and Skenes: normal                 Vagina: normal appearing vagina with normal color and discharge, no lesions               Cervix: normal in appearance with 3cm IUD string noted              Pap taken: Yes.   Bimanual Exam:  Uterus:  normal size, contour, position, consistency, mobility, non-tender              Adnexa: normal adnexa and no mass, fullness, tenderness               Rectovaginal: Confirms               Anus:  normal sphincter tone, no lesions  Endometrial biopsy recommended.  Discussed with patient.  Verbal and written consent obtained.   Procedure:  Speculum placed.  Cervix visualized and cleansed with betadine prep.  A single toothed tenaculum was applied to the anterior lip of the cervix.  Endometrial pipelle was advanced through the cervix into the endometrial cavity without difficulty.  Pipelle passed to 7cm.  Suction applied and pipelle removed with good tissue sample obtained.  Tenculum removed.  No bleeding noted.  Patient tolerated procedure well.  Chaperone was present for exam.  A:  Well Woman with normal exam  H/O endometrial cancer diagnosed 2014, consult with Dr. Fermin Schwab recommended IUD placement which he did.  Biopsy 9/15 with inactive endometrium.  No follow up since. Continued PMP bleeding  Atrophic vaginitis  Morbid obesity  H/O thyroidectomy with hypothyroidism and hypoparathyroidism  Afib, rate controlled  Nephrolithiasis with h/o microscopic hematuria  P: Mammogram highly recommended. Patient declines for now as she has in the past.  pap smear today with HR HPV  Endometrial biopsy done  Pt will need gyn onc consultation again this year.  I have really tried to impress upon her the importance of follow up with specialist due to her hx but she indicates she doesn't feel this is necessary. return annually or prn

## 2014-08-28 ENCOUNTER — Other Ambulatory Visit: Payer: Self-pay | Admitting: Family

## 2014-08-29 ENCOUNTER — Telehealth: Payer: Self-pay | Admitting: *Deleted

## 2014-08-29 LAB — IPS PAP TEST WITH REFLEX TO HPV

## 2014-08-29 NOTE — Telephone Encounter (Signed)
Refill sent per [facility] refill protocol/SLS  

## 2014-08-29 NOTE — Telephone Encounter (Signed)
Received call from Dr. Sabra Heck - she would like to get Dr. Serita Grit recommendation regarding patient. Patient had IUD placed in 2014 by Dr. Fermin Schwab. Dr. Sabra Heck states she will do annual endometrial biopsies and is wondering if Dr. Denman George would recommend any further evaluation such as a pelvic MRI. Information passed along to Dr. Denman George to followup.

## 2014-08-31 ENCOUNTER — Telehealth: Payer: Self-pay | Admitting: Emergency Medicine

## 2014-08-31 DIAGNOSIS — C541 Malignant neoplasm of endometrium: Secondary | ICD-10-CM

## 2014-08-31 NOTE — Telephone Encounter (Signed)
-----   Message from Megan Salon, MD sent at 08/30/2014 10:15 AM EDT ----- Please inform pt pap was negative and endometrial biopsy was negative for any abnormal cells.  I called gyn/onc yesterday and have not heard back from Dr. Denman George yet about any other recommendations.  We will need to call her back after I hear from Dr. Denman George.  Pt had endometrial cancer that is being treated with Mirena IUD.

## 2014-08-31 NOTE — Telephone Encounter (Signed)
-----   Message from Megan Salon, MD sent at 08/31/2014 12:52 PM EDT ----- [name], I am not sure if I routed this message to you initially but can you inform pt that her endometrial biopsy and Pap were negative.    Pt with hx of endometrial cancer being treated with Mirena IUD.  Dr. Denman George, gyn/onc, recommends endometrial biopsy every six months so she needs a follow up appointment scheduled with me in six month for repeat exam and endometrial biopsy.  No MRI needed at this time.  Thanks.

## 2014-08-31 NOTE — Telephone Encounter (Signed)
Spoke with patient and message from Dr. Sabra Heck given. Patient verbalized understanding of message agreeable to appointment in 6 months for exam and Endometrial biopsy.  Appointment scheduled for 03/01/15 and order placed. Advised patient to contact our office with any insurance changes prior to appointment.  Routing to provider for final review. Patient agreeable to disposition. Will close encounter   cc Felipa Emory.

## 2014-08-31 NOTE — Telephone Encounter (Signed)
Message left to return call to Monifa Blanchette at [phone].    

## 2014-08-31 NOTE — Telephone Encounter (Deleted)
-----   Message from Megan Salon, MD sent at 08/30/2014 10:15 AM EDT ----- Please inform pt pap was negative and endometrial biopsy was negative for any abnormal cells.  I called gyn/onc yesterday and have not heard back from Dr. Denman George yet about any other recommendations.  We will need to call her back after I hear from Dr. Denman George.  Pt had endometrial cancer that is being treated with Mirena IUD.

## 2014-09-09 ENCOUNTER — Other Ambulatory Visit: Payer: Self-pay | Admitting: Family

## 2014-09-11 NOTE — Telephone Encounter (Signed)
Last Rx 08/08/14. Last OV 05/2013 and pt is due for f/u in 11/2014. UDS 05/2014 and will need controlled substance contract.  Rx printed and forwarded to Provider for signature.

## 2014-09-11 NOTE — Telephone Encounter (Signed)
Rx was faxed to pharmacy this morning.

## 2014-09-12 ENCOUNTER — Ambulatory Visit (INDEPENDENT_AMBULATORY_CARE_PROVIDER_SITE_OTHER): Payer: BLUE CROSS/BLUE SHIELD | Admitting: *Deleted

## 2014-09-12 DIAGNOSIS — E538 Deficiency of other specified B group vitamins: Secondary | ICD-10-CM

## 2014-09-12 MED ORDER — CYANOCOBALAMIN 1000 MCG/ML IJ SOLN
1000.0000 ug | Freq: Once | INTRAMUSCULAR | Status: AC
  Administered 2014-09-12: 1000 ug via INTRAMUSCULAR

## 2014-09-12 NOTE — Progress Notes (Signed)
Pre visit review using our clinic review tool, if applicable. No additional management support is needed unless otherwise documented below in the visit note.  Patient tolerated injection well.  Next injection scheduled for 10/12/14.

## 2014-09-14 ENCOUNTER — Ambulatory Visit (HOSPITAL_BASED_OUTPATIENT_CLINIC_OR_DEPARTMENT_OTHER)
Admission: RE | Admit: 2014-09-14 | Payer: BLUE CROSS/BLUE SHIELD | Source: Ambulatory Visit | Admitting: Orthopedic Surgery

## 2014-09-14 ENCOUNTER — Encounter (HOSPITAL_BASED_OUTPATIENT_CLINIC_OR_DEPARTMENT_OTHER): Admission: RE | Payer: Self-pay | Source: Ambulatory Visit

## 2014-09-14 SURGERY — AMPUTATION, FOOT, RAY
Anesthesia: General | Site: Foot | Laterality: Left

## 2014-09-19 ENCOUNTER — Other Ambulatory Visit: Payer: Self-pay | Admitting: Internal Medicine

## 2014-09-21 ENCOUNTER — Other Ambulatory Visit (INDEPENDENT_AMBULATORY_CARE_PROVIDER_SITE_OTHER): Payer: BLUE CROSS/BLUE SHIELD

## 2014-09-21 DIAGNOSIS — E538 Deficiency of other specified B group vitamins: Secondary | ICD-10-CM

## 2014-09-21 DIAGNOSIS — E559 Vitamin D deficiency, unspecified: Secondary | ICD-10-CM | POA: Diagnosis not present

## 2014-09-21 LAB — VITAMIN D 25 HYDROXY (VIT D DEFICIENCY, FRACTURES): VITD: 27.79 ng/mL — AB (ref 30.00–100.00)

## 2014-09-21 LAB — VITAMIN B12: VITAMIN B 12: 505 pg/mL (ref 211–911)

## 2014-09-22 ENCOUNTER — Telehealth: Payer: Self-pay | Admitting: Family

## 2014-09-22 DIAGNOSIS — E559 Vitamin D deficiency, unspecified: Secondary | ICD-10-CM

## 2014-09-22 MED ORDER — ERGOCALCIFEROL 1.25 MG (50000 UT) PO CAPS: 50000.0000 [IU] | ORAL_CAPSULE | ORAL | Status: DC

## 2014-09-22 NOTE — Telephone Encounter (Signed)
LM for return call

## 2014-09-22 NOTE — Telephone Encounter (Signed)
b12 is improved.  Vit D is still low.  Increase vit d 50000 to twice weekly, repeat vit D in 6 weeks. Dx vit D deficiency

## 2014-09-26 NOTE — Telephone Encounter (Signed)
Patient returned phone call. Best # [phone]

## 2014-09-27 NOTE — Telephone Encounter (Signed)
Left detailed message on cell# and to call and schedule lab visit in 6 weeks. Lab order entered.

## 2014-10-02 ENCOUNTER — Telehealth: Payer: Self-pay | Admitting: Family

## 2014-10-02 NOTE — Telephone Encounter (Signed)
Caller name: [facility]  Call back number: [phone]   Reason for call:  Pharmacy in need of direction clarification regarding ergocalciferol (VITAMIN D2) 50000 UNITS capsule please use reference #[id number]

## 2014-10-02 NOTE — Telephone Encounter (Signed)
Spoke with Tiffany and advised her that pt should take 2 capsules by mouth two times per week. Med list updated.

## 2014-10-03 ENCOUNTER — Other Ambulatory Visit: Payer: Self-pay | Admitting: Family

## 2014-10-03 NOTE — Telephone Encounter (Signed)
See rx. 

## 2014-10-03 NOTE — Telephone Encounter (Signed)
Rx faxed to pharmacy  

## 2014-10-12 ENCOUNTER — Ambulatory Visit (INDEPENDENT_AMBULATORY_CARE_PROVIDER_SITE_OTHER): Payer: BLUE CROSS/BLUE SHIELD | Admitting: *Deleted

## 2014-10-12 DIAGNOSIS — E538 Deficiency of other specified B group vitamins: Secondary | ICD-10-CM

## 2014-10-12 MED ORDER — CYANOCOBALAMIN 1000 MCG/ML IJ SOLN
1000.0000 ug | Freq: Once | INTRAMUSCULAR | Status: AC
  Administered 2014-10-12: 1000 ug via INTRAMUSCULAR

## 2014-10-12 NOTE — Progress Notes (Signed)
Pre visit review using our clinic review tool, if applicable. No additional management support is needed unless otherwise documented below in the visit note.  Patient tolerated injection well.  Per phone note, patient due for vitamin D lab in 6 weeks, lab visit scheduled 11/09/13 and next B12 injection scheduled same day.

## 2014-10-23 ENCOUNTER — Other Ambulatory Visit: Payer: Self-pay | Admitting: Family

## 2014-10-24 NOTE — Telephone Encounter (Signed)
Notified pt. She states she is not going to continue to do UDS as her insurance does not cover it and it is too costly for her.  Please advise re: Rx.

## 2014-10-24 NOTE — Telephone Encounter (Signed)
Ok to refill, but due for UDS please.

## 2014-10-24 NOTE — Telephone Encounter (Signed)
CSC and UDS--moderate 06/15/14. Last Rx given 10/03/14, #30.  Please advise.  Medication name:  Name from pharmacy:  traMADol (ULTRAM) 50 MG tablet TRAMADOL 50MG  TABLETS     Sig: TAKE 1 TABLET BY MOUTH EVERY 6 HOURS AS NEEDED FOR PAIN    Dispense: 30 tablet   Refills: 0   Start: 10/23/2014   Class: Normal    Requested on: 10/23/2014    Originally ordered on: 05/05/2014 10/03/2014

## 2014-10-25 NOTE — Telephone Encounter (Signed)
Notified pt. She states that she will let us know going forward if she changes her mind.

## 2014-10-25 NOTE — Telephone Encounter (Signed)
That is fine, but it is our policy that if pt's are to continue to receive controlled substances, that the perform routine UDS's so we will be unable to continue refills for tramadol without UDS.  Please advise her that the company that performs the testing has told us that they do not send unpaid balances to collections if she is unable to pay.

## 2014-11-10 ENCOUNTER — Ambulatory Visit (INDEPENDENT_AMBULATORY_CARE_PROVIDER_SITE_OTHER): Payer: BLUE CROSS/BLUE SHIELD | Admitting: *Deleted

## 2014-11-10 ENCOUNTER — Other Ambulatory Visit (INDEPENDENT_AMBULATORY_CARE_PROVIDER_SITE_OTHER): Payer: BLUE CROSS/BLUE SHIELD

## 2014-11-10 ENCOUNTER — Other Ambulatory Visit: Payer: Self-pay | Admitting: Family

## 2014-11-10 DIAGNOSIS — E559 Vitamin D deficiency, unspecified: Secondary | ICD-10-CM

## 2014-11-10 DIAGNOSIS — E538 Deficiency of other specified B group vitamins: Secondary | ICD-10-CM | POA: Diagnosis not present

## 2014-11-10 LAB — VITAMIN D 25 HYDROXY (VIT D DEFICIENCY, FRACTURES): VITD: 19.27 ng/mL — AB (ref 30.00–100.00)

## 2014-11-10 MED ORDER — CYANOCOBALAMIN 1000 MCG/ML IJ SOLN
1000.0000 ug | Freq: Once | INTRAMUSCULAR | Status: AC
  Administered 2014-11-10: 1000 ug via INTRAMUSCULAR

## 2014-11-10 NOTE — Progress Notes (Signed)
Pre visit review using our clinic review tool, if applicable. No additional management support is needed unless otherwise documented below in the visit note.  Pt tolerated injection well.    

## 2014-11-10 NOTE — Telephone Encounter (Signed)
Requesting Tramadol 50mg -Take 1 tablet by mouth every 6 hours as needed for pain. Last refill:10-03-14;#30,0 Last OV:06-14-14-F/U in 6 month UDS:11-10-14 Please advise.//AB/CMA

## 2014-11-13 NOTE — Telephone Encounter (Signed)
Vit D has actually dropped. Is she taking 2 times a week? Take on empty stomach twice weekly for another 8 weeks, then repeat level. See tramadol refill.

## 2014-11-14 NOTE — Telephone Encounter (Signed)
Rx faxed to pharmacy  

## 2014-11-15 ENCOUNTER — Other Ambulatory Visit (HOSPITAL_BASED_OUTPATIENT_CLINIC_OR_DEPARTMENT_OTHER): Payer: BLUE CROSS/BLUE SHIELD

## 2014-11-15 ENCOUNTER — Encounter: Payer: Self-pay | Admitting: *Deleted

## 2014-11-15 ENCOUNTER — Ambulatory Visit: Payer: BLUE CROSS/BLUE SHIELD | Admitting: Family

## 2014-11-15 DIAGNOSIS — D509 Iron deficiency anemia, unspecified: Secondary | ICD-10-CM | POA: Diagnosis not present

## 2014-11-15 DIAGNOSIS — D510 Vitamin B12 deficiency anemia due to intrinsic factor deficiency: Secondary | ICD-10-CM

## 2014-11-15 LAB — COMPREHENSIVE METABOLIC PANEL
ALBUMIN: 3.9 g/dL (ref 3.5–5.2)
ALK PHOS: 100 U/L (ref 39–117)
ALT: 36 U/L — AB (ref 0–35)
AST: 20 U/L (ref 0–37)
BILIRUBIN TOTAL: 0.4 mg/dL (ref 0.2–1.2)
BUN: 16 mg/dL (ref 6–23)
CO2: 24 mEq/L (ref 19–32)
Calcium: 8.9 mg/dL (ref 8.4–10.5)
Chloride: 101 mEq/L (ref 96–112)
Creatinine, Ser: 0.8 mg/dL (ref 0.50–1.10)
GLUCOSE: 92 mg/dL (ref 70–99)
POTASSIUM: 3.8 meq/L (ref 3.5–5.3)
SODIUM: 140 meq/L (ref 135–145)
TOTAL PROTEIN: 7.3 g/dL (ref 6.0–8.3)

## 2014-11-15 LAB — CBC WITH DIFFERENTIAL (CANCER CENTER ONLY)
BASO#: 0.1 10*3/uL (ref 0.0–0.2)
BASO%: 0.5 % (ref 0.0–2.0)
EOS%: 2.4 % (ref 0.0–7.0)
Eosinophils Absolute: 0.3 10*3/uL (ref 0.0–0.5)
HCT: 41.6 % (ref 34.8–46.6)
HGB: 13.4 g/dL (ref 11.6–15.9)
LYMPH#: 1.6 10*3/uL (ref 0.9–3.3)
LYMPH%: 15.6 % (ref 14.0–48.0)
MCH: 27.9 pg (ref 26.0–34.0)
MCHC: 32.2 g/dL (ref 32.0–36.0)
MCV: 87 fL (ref 81–101)
MONO#: 0.6 10*3/uL (ref 0.1–0.9)
MONO%: 6 % (ref 0.0–13.0)
NEUT#: 7.7 10*3/uL — ABNORMAL HIGH (ref 1.5–6.5)
NEUT%: 75.5 % (ref 39.6–80.0)
Platelets: 197 10*3/uL (ref 145–400)
RBC: 4.81 10*6/uL (ref 3.70–5.32)
RDW: 15.6 % (ref 11.1–15.7)
WBC: 10.2 10*3/uL — AB (ref 3.9–10.0)

## 2014-11-15 LAB — IRON AND TIBC CHCC
%SAT: 17 % — ABNORMAL LOW (ref 21–57)
IRON: 44 ug/dL (ref 41–142)
TIBC: 256 ug/dL (ref 236–444)
UIBC: 213 ug/dL (ref 120–384)

## 2014-11-15 LAB — FERRITIN CHCC: Ferritin: 1175 ng/ml — ABNORMAL HIGH (ref 9–269)

## 2014-11-15 LAB — RETICULOCYTES (CHCC)
ABS Retic: 96.2 10*3/uL (ref 19.0–186.0)
RBC.: 4.81 MIL/uL (ref 3.87–5.11)
RETIC CT PCT: 2 % (ref 0.4–2.3)

## 2014-11-15 NOTE — Progress Notes (Signed)
When patient was checked in, she was told that she had a lab and MD apt. Per the registration desk, she stated that she didn't have time, and wouldn't be staying for the MD apt. She had labs drawn and when RN went to get patient from lobby she had left, thereby becoming a no-show for her MD apt

## 2014-11-16 ENCOUNTER — Other Ambulatory Visit: Payer: Self-pay | Admitting: Family

## 2014-11-16 NOTE — Telephone Encounter (Signed)
Informed patient of med refill and appointment scheduled for 12/18/14

## 2014-11-16 NOTE — Telephone Encounter (Signed)
Refilled pt's HCTZ, Valsartan-hct and furosemide. Pt is due for 6 month follow up with Melissa in July.  Please call pt to arrange appt.

## 2014-12-18 ENCOUNTER — Ambulatory Visit (INDEPENDENT_AMBULATORY_CARE_PROVIDER_SITE_OTHER): Payer: BLUE CROSS/BLUE SHIELD | Admitting: Family

## 2014-12-18 ENCOUNTER — Encounter: Payer: Self-pay | Admitting: Family

## 2014-12-18 ENCOUNTER — Telehealth: Payer: Self-pay | Admitting: Family

## 2014-12-18 VITALS — BP 126/80 | HR 90 | Temp 97.7°F | Resp 18 | Ht 65.0 in | Wt 310.8 lb

## 2014-12-18 DIAGNOSIS — I10 Essential (primary) hypertension: Secondary | ICD-10-CM | POA: Diagnosis not present

## 2014-12-18 DIAGNOSIS — I4891 Unspecified atrial fibrillation: Secondary | ICD-10-CM

## 2014-12-18 DIAGNOSIS — C541 Malignant neoplasm of endometrium: Secondary | ICD-10-CM | POA: Diagnosis not present

## 2014-12-18 DIAGNOSIS — D510 Vitamin B12 deficiency anemia due to intrinsic factor deficiency: Secondary | ICD-10-CM | POA: Diagnosis not present

## 2014-12-18 DIAGNOSIS — J4540 Moderate persistent asthma, uncomplicated: Secondary | ICD-10-CM | POA: Diagnosis not present

## 2014-12-18 MED ORDER — CYANOCOBALAMIN 1000 MCG/ML IJ SOLN
1000.0000 ug | INTRAMUSCULAR | Status: DC
  Administered 2014-12-18 – 2015-03-20 (×3): 1000 ug via INTRAMUSCULAR

## 2014-12-18 MED ORDER — TRAMADOL HCL 50 MG PO TABS: ORAL_TABLET | ORAL | Status: DC

## 2014-12-18 MED ORDER — BUDESONIDE-FORMOTEROL FUMARATE 160-4.5 MCG/ACT IN AERO: 2.0000 | INHALATION_SPRAY | Freq: Two times a day (BID) | RESPIRATORY_TRACT | Status: DC

## 2014-12-18 NOTE — Assessment & Plan Note (Signed)
Lungs clear today, continue symbicort and albuterol.

## 2014-12-18 NOTE — Telephone Encounter (Signed)
See my chart message

## 2014-12-18 NOTE — Patient Instructions (Addendum)
Restart aspirin 81 mg once daily.  Schedule follow up with Dr. Aldean Ast. Follow up in 3 months.

## 2014-12-18 NOTE — Assessment & Plan Note (Signed)
Stable on HCTZ, continue same.

## 2014-12-18 NOTE — Assessment & Plan Note (Signed)
Advised pt to resume aspirin for stroke prevention.

## 2014-12-18 NOTE — Assessment & Plan Note (Signed)
Advised pt follow up with GYN oncology.

## 2014-12-18 NOTE — Progress Notes (Signed)
Subjective:    Patient ID: [name], female    DOB: 1955/10/10, 59 y.o.   MRN: [id number]  HPI  [name] is a 59 yr old female who presents today for follow up.  Anemia- Reports that she is no longer taking an iron supplement. She continues b12 injections monthly.  Lab Results  Component Value Date   WBC 10.2* 11/15/2014   HGB 13.4 11/15/2014   HCT 41.6 11/15/2014   MCV 87 11/15/2014   PLT 197 11/15/2014   Endometrial CA- was originally followed by Dr. Aldean Ast. She had an IUD placed and apparently never followed back up as recommended.  She saw Dr. Hale Bogus on 08/25/14 and had an endometrial biopsy performed at this visit.  Path was negative for abnormal cells.   Asthma- notes symptoms have been worse over the last 2 weeks.  Feels bloated. Attributes symptoms to the heat. She continues symbicort and prn albuterol.   HTN- reports BP has been stable at home.   BP Readings from Last 3 Encounters:  12/18/14 126/80  08/25/14 122/68  08/16/14 151/73   She is off of xarelto, she was placed on aspirin by cardiology.  She reports that she bruises easily on aspirin.  As a result she stopped aspirin.    Hx thyroid ca- managed by Endo- Dr. Buddy Duty   Review of Systems    see HPI  Past Medical History  Diagnosis Date  . Allergic rhinitis   . Asthma   . Hypertension   . Hypothyroidism   . Anemia, iron deficiency   . Vitamin D deficiency   . Morbid obesity   . GERD (gastroesophageal reflux disease)   . Hypoparathyroidism 01/07/2011  . Fatty liver 01/07/2012  . Headache(784.0)     occasional sinus headache   . Atrial fibrillation August 2012    OFF XARELTO LAST MONTH DUE TO BLEEDING IN STOOL  . History of blood transfusion JULY 2015  . Fibroid 1974    fibroid cyst on left fallopian tube  . Abnormal Pap smear     years ago/no biopsy  . Bursitis of hip left  . Edema of both legs   . Enlarged liver   . Iron deficiency anemia   . Foot ulcer     AREA HEALED  RIGHT FOOT  . Seizures     infancy secondary to fever  . Nephrolithiasis     SEES DR Risa Grill  . Thyroid cancer 2011    THYROIDECTOMY DONE  . Uterine cancer DX 2014    NO CURRENT TX FOR  . PONV (postoperative nausea and vomiting)   . Pernicious anemia 02/20/2014  . Dysrhythmia     afib, followed by Dr. Stanford Breed   . Arthritis     back- lower  . Fibromyalgia   . Renal stones 2/16    History   Social History  . Marital Status: Single    Spouse Name: N/A  . Number of Children: 0  . Years of Education: N/A   Occupational History  . works in Insurance claims handler   . [employer]    Social History Main Topics  . Smoking status: Former Smoker -- 0.50 packs/day for 25 years    Types: Cigarettes    Start date: 12/24/1970    Quit date: 05/27/1995  . Smokeless tobacco: Never Used     Comment: quit smoking 19 years ago  . Alcohol Use: 0.0 oz/week    0 Standard drinks or equivalent per week  Comment: 1/2 glass per month  . Drug Use: No  . Sexual Activity:    Partners: Male   Other Topics Concern  . Not on file   Social History Narrative   Occupation: works in Insurance claims handler - Field seismologist   Single       Former Smoker - quit tobacco 12 years ago.  She was light smoker for 10 years.                 Past Surgical History  Procedure Laterality Date  . Tonsillectomy and adenoidectomy    . Bunionectomy      bilateral  . Appendectomy    . Gastric bypass  1974  . Thyroidectomy  05/15/2010  . Cyst on ovary removed     . Ureteroscopy  03/03/2012    Procedure: URETEROSCOPY;  Surgeon: Bernestine Amass, MD;  Location: [facility];  Service: Urology;  Laterality: Left;  . Cystoscopy w/ retrogrades  03/03/2012    Procedure: CYSTOSCOPY WITH RETROGRADE PYELOGRAM;  Surgeon: Bernestine Amass, MD;  Location: [facility];  Service: Urology;  Laterality: Bilateral;  . Lithotripsy  03/2012  . Cystoscopy with retrograde pyelogram, ureteroscopy and stent placement Left 08/25/2012     Procedure: CYSTOSCOPY WITH RETROGRADE PYELOGRAM, URETEROSCOPY;  Surgeon: Bernestine Amass, MD;  Location: [facility];  Service: Urology;  Laterality: Left;  . Holmium laser application Left 08/29/5033    Procedure: HOLMIUM LASER APPLICATION;  Surgeon: Bernestine Amass, MD;  Location: [facility];  Service: Urology;  Laterality: Left;  . Dilation and curettage of uterus N/A 02/21/2013    Procedure: DILATATION AND CURETTAGE;  Surgeon: Lyman Speller, MD;  Location: [facility];  Service: Gynecology;  Laterality: N/A;  . Colonoscopy with propofol N/A 02/02/2014    Procedure: COLONOSCOPY WITH PROPOFOL;  Surgeon: Irene Shipper, MD;  Location: [facility];  Service: Endoscopy;  Laterality: N/A;  . Esophagogastroduodenoscopy (egd) with propofol N/A 02/02/2014    Procedure: ESOPHAGOGASTRODUODENOSCOPY (EGD) WITH PROPOFOL;  Surgeon: Irene Shipper, MD;  Location: [facility];  Service: Endoscopy;  Laterality: N/A;  . Amputation Right 05/18/2014    Procedure: RIGHT FIFTH RAY AMPUTATION FOOT;  Surgeon: Wylene Simmer, MD;  Location: [facility];  Service: Orthopedics;  Laterality: Right;  . Lithotripsy  2/16    Family History  Problem Relation Age of Onset  . Hypertension Father   . Diabetes Father   . Lung cancer Father   . Heart attack Father     MI at age 66  . Hypertension Mother   . Hyperthyroidism Mother   . Heart disease Mother   . Heart attack Mother     MI at age 52  . Asthma Brother   . Hypertension Brother     younger  . Heart disease Brother     older  . Colon cancer Neg Hx   . Esophageal cancer Neg Hx   . Stomach cancer Neg Hx   . Kidney disease Neg Hx   . Liver disease Neg Hx     Allergies  Allergen Reactions  . Other Anaphylaxis    NUTS  . Amoxicillin-Pot Clavulanate Other (See Comments)    headache  . Food Hives    Potato  . Penicillins Other (See Comments)    headache  . Tomato Hives    Current Outpatient Prescriptions on File Prior to Visit  Medication Sig Dispense Refill  . albuterol  (PROVENTIL HFA;VENTOLIN HFA) 108 (90 BASE) MCG/ACT inhaler Inhale 2 puffs into  the lungs every 6 (six) hours as needed for wheezing or shortness of breath.    [name] albuterol (PROVENTIL) (2.5 MG/3ML) 0.083% nebulizer solution Take 2.5 mg by nebulization every 6 (six) hours as needed for wheezing or shortness of breath.    . Ascorbic Acid (VITAMIN C CR) 1000 MG TBCR Take 1 tablet by mouth 2 (two) times a week. On Tuesday and Thursday.    [name] aspirin EC 81 MG tablet Take 1 tablet (81 mg total) by mouth daily. 90 tablet 3  . BLACK COHOSH PO Take 60 mg by mouth daily.    . calcitRIOL (ROCALTROL) 0.5 MCG capsule Take 1.5 mcg by mouth daily.     . calcium carbonate (TUMS - DOSED IN MG ELEMENTAL CALCIUM) 500 MG chewable tablet Chew 1 tablet by mouth daily.     . cyanocobalamin (,VITAMIN B-12,) 1000 MCG/ML injection 1043mcg IM once weekly x 4 weeks, then once monthly    . cyclobenzaprine (FLEXERIL) 10 MG tablet Take 10 mg by mouth at bedtime.    [name] diltiazem (CARTIA XT) 240 MG 24 hr capsule Take 1 capsule (240 mg total) by mouth daily. 90 capsule 3  . DIPHENHYDRAMINE-PSEUDOEPHED PO Take 1 tablet by mouth every 6 (six) hours as needed (for allegies).    . ergocalciferol (VITAMIN D2) 50000 UNITS capsule Take 1 capsule (50,000 Units total) by mouth 2 (two) times a week. Taking daily (Patient taking differently: Take 50,000 Units by mouth 2 (two) times a week. ) 24 capsule 0  . fluticasone (FLONASE) 50 MCG/ACT nasal spray Place 2 sprays into both nostrils daily as needed for allergies or rhinitis.     . furosemide (LASIX) 40 MG tablet TAKE 1 TABLET DAILY 90 tablet 1  . hydrochlorothiazide (HYDRODIURIL) 25 MG tablet TAKE 1 TABLET DAILY 90 tablet 1  . iron polysaccharides (NU-IRON) 150 MG capsule Take 150 mg by mouth 2 (two) times a week.     . levothyroxine (SYNTHROID, LEVOTHROID) 137 MCG tablet Take 411 mcg by mouth daily. Takes 3 tabs in AM    . loratadine (CLARITIN) 10 MG tablet Take 10 mg by mouth daily.    [name]  MANGANESE PO Take 1,250 mg by mouth 2 (two) times daily.    . methocarbamol (ROBAXIN) 500 MG tablet Take 500 mg by mouth 2 (two) times daily.    [name] nystatin cream (MYCOSTATIN) Apply 1 application topically 2 (two) times daily as needed for dry skin.     [name] omeprazole (PRILOSEC) 40 MG capsule TAKE 1 CAPSULE DAILY 90 capsule 1  . SYMBICORT 160-4.5 MCG/ACT inhaler INHALE 2 PUFFS BY MOUTH TWICE DAILY 10.2 g 1  . traMADol (ULTRAM) 50 MG tablet TAKE 1 TABLET BY MOUTH EVERY 6 HOURS AS NEEDED FOR FOR PAIN 30 tablet 0  . valsartan (DIOVAN) 80 MG tablet TAKE 1 TABLET DAILY 90 tablet 1  . VIRTRATE-K 1100-334 MG/5ML solution   0  . vitamin B-12 (CYANOCOBALAMIN) 1000 MCG tablet Take 1,000 mcg by mouth daily.    . zafirlukast (ACCOLATE) 20 MG tablet Take 1 tablet (20 mg total) by mouth 2 (two) times daily before a meal. 180 tablet 1   No current facility-administered medications on file prior to visit.    BP 126/80 mmHg  Pulse 90  Temp(Src) 97.7 F (36.5 C) (Oral)  Resp 18  Ht 5\' 5"  (1.651 m)  Wt 310 lb 12.8 oz (140.978 kg)  BMI 51.72 kg/m2  SpO2 99%  LMP 05/26/2009    Objective:  Physical Exam  Constitutional: She is oriented to person, place, and time. She appears well-developed and well-nourished.  HENT:  Head: Normocephalic and atraumatic.  Cardiovascular: Normal rate, regular rhythm and normal heart sounds.   No murmur heard. Pulmonary/Chest: Effort normal and breath sounds normal. No respiratory distress. She has no wheezes.  Musculoskeletal:  1+ LLE edema, trace RLE edema  Neurological: She is alert and oriented to person, place, and time.  Psychiatric: She has a normal mood and affect. Her behavior is normal. Judgment and thought content normal.          Assessment & Plan:

## 2014-12-18 NOTE — Assessment & Plan Note (Signed)
Blood count is normal. OK to remain off of iron supplement. I think that her hematuria was contributing to her anemia when she was on xarelto. She is no longer on xarelto.

## 2014-12-21 ENCOUNTER — Other Ambulatory Visit: Payer: Self-pay | Admitting: Family

## 2014-12-27 ENCOUNTER — Encounter: Payer: Self-pay | Admitting: *Deleted

## 2015-01-12 ENCOUNTER — Encounter: Payer: Self-pay | Admitting: Family

## 2015-01-13 ENCOUNTER — Other Ambulatory Visit: Payer: Self-pay | Admitting: Family

## 2015-01-15 NOTE — Telephone Encounter (Signed)
Rx request to pharmacy/SLS  

## 2015-01-18 ENCOUNTER — Ambulatory Visit (INDEPENDENT_AMBULATORY_CARE_PROVIDER_SITE_OTHER): Payer: BLUE CROSS/BLUE SHIELD

## 2015-01-18 DIAGNOSIS — E538 Deficiency of other specified B group vitamins: Secondary | ICD-10-CM

## 2015-01-18 DIAGNOSIS — D510 Vitamin B12 deficiency anemia due to intrinsic factor deficiency: Secondary | ICD-10-CM | POA: Diagnosis not present

## 2015-02-02 ENCOUNTER — Other Ambulatory Visit: Payer: Self-pay | Admitting: Family

## 2015-02-02 NOTE — Telephone Encounter (Signed)
Rx faxed to pharmacy  

## 2015-02-02 NOTE — Telephone Encounter (Signed)
Requesting Tramadol 50mg -Take 1 tablet by mouth every 6 hours as needed for pain. Last refill:12/18/14;#30,0 Last OV:12/18/14 Please advise.//AB/CMA

## 2015-02-02 NOTE — Telephone Encounter (Signed)
See rx. 

## 2015-02-11 ENCOUNTER — Other Ambulatory Visit: Payer: Self-pay | Admitting: Cardiology

## 2015-02-20 ENCOUNTER — Ambulatory Visit (INDEPENDENT_AMBULATORY_CARE_PROVIDER_SITE_OTHER): Payer: BLUE CROSS/BLUE SHIELD

## 2015-02-20 DIAGNOSIS — Z23 Encounter for immunization: Secondary | ICD-10-CM | POA: Diagnosis not present

## 2015-02-20 DIAGNOSIS — E538 Deficiency of other specified B group vitamins: Secondary | ICD-10-CM | POA: Diagnosis not present

## 2015-02-20 MED ORDER — CYANOCOBALAMIN 1000 MCG/ML IJ SOLN
1000.0000 ug | Freq: Once | INTRAMUSCULAR | Status: AC
  Administered 2015-02-20: 1000 ug via INTRAMUSCULAR

## 2015-02-20 NOTE — Progress Notes (Signed)
Pre visit review using our clinic review tool, if applicable. No additional management support is needed unless otherwise documented below in the visit note.  Patient tolerated injections well.  Next injection scheduled for 03/20/15 at 9:30 AM.

## 2015-03-01 ENCOUNTER — Ambulatory Visit (INDEPENDENT_AMBULATORY_CARE_PROVIDER_SITE_OTHER): Payer: BLUE CROSS/BLUE SHIELD | Admitting: Obstetrics & Gynecology

## 2015-03-01 ENCOUNTER — Encounter: Payer: Self-pay | Admitting: Obstetrics & Gynecology

## 2015-03-01 DIAGNOSIS — C541 Malignant neoplasm of endometrium: Secondary | ICD-10-CM | POA: Diagnosis not present

## 2015-03-01 NOTE — Progress Notes (Addendum)
Subjective:     Patient ID: [name], female   DOB: 1955/06/04, 59 y.o.   MRN: [id number]  HPI 22 G0 SWF here for follow-up endometrial biopsy due to hx of endometrial cancer diagnosed 01/03/13 with office endometrial biopsy showing Grade 1 endometrial adenocarcinoma in backgroun dof simple and complex endometrial hyperplasia.  Pt saw [name] 01/12/15 for consultation.  Due to co-morbidiies, Mirena IUD was recommended.  Pelvic MRI was performed first per his recommendation showing 26mm endometrium.  D&C was also performed before IUD placement 02/21/13 showing endometrial adenocarcinoma associated with complex endometrial hyperplasia.  IUD then placed 02/28/13.   Pt was lost to gyn/onc follow up and was seen April, 2016, for AEX.  Pap was obtained that was negative and an endometrial biopsy was performed showing benign tissue with exogenous hormonal effect.  I communicated with [name], gyn/onc, regarding follow-up.  Repeat biopsy at six months was recommended.  No additional MRI was recommended.  Pt continues to desire follow-up with me.  Pt reports she is still having slight pinkness in her underwear in the mornings.  This is less frequent and smaller amounts since she was seen in April.  Denies pelvic pain.  She reports having another kidney stone in September but was able to pass this on her own.     Review of Systems  All other systems reviewed and are negative.      Objective:   Physical Exam  Constitutional: She is oriented to person, place, and time. She appears well-developed and well-nourished.  Morbidly obese  Genitourinary: Vagina normal and uterus normal. There is no rash, tenderness or lesion on the right labia. There is no rash, tenderness or lesion on the left labia. Cervix exhibits motion tenderness, discharge and friability. Right adnexum displays no mass, no tenderness and no fullness. Left adnexum displays no mass, no tenderness and no fullness.  IUD string  was noted.  No vaginal bleeding noted.  Lymphadenopathy:       Right: No inguinal adenopathy present.       Left: No inguinal adenopathy present.  Neurological: She is alert and oriented to person, place, and time.  Skin: Skin is warm.  Psychiatric: She has a normal mood and affect.   Endometrial biopsy recommended.  Discussed with patient.  Verbal and written consent obtained.   Procedure:  Speculum placed.  Pap obtained.  Cervix visualized and cleansed with betadine prep.  A single toothed tenaculum was applied to the anterior lip of the cervix.  Endometrial pipelle was advanced through the cervix into the endometrial cavity without difficulty.  Pipelle passed to 7.5cm.  Suction applied and pipelle removed with good tissue sample obtained.  Tenculum removed.  No bleeding noted.  IUD was not removed with procedure.  Patient tolerated procedure well.     Assessment:     Grade 1 endometrial adenocarcinoma Morbid obesity  Mirena IUD placed 02/28/13    Plan:     Pap and endometrial biopsy obtained.  Will communicate with [name] regarding any recommendation changes pending pathology and Pap results.

## 2015-03-02 LAB — IPS PAP TEST WITH REFLEX TO HPV

## 2015-03-09 ENCOUNTER — Telehealth: Payer: Self-pay | Admitting: *Deleted

## 2015-03-09 NOTE — Telephone Encounter (Signed)
Received phone call from Dr. Sabra Heck regarding recent endometrial biopsy results from 03/01/15. Information reviewed by Dr. Fermin Schwab and return call made to Dr. Sabra Heck - voicemail left for her with Dr. Mora Bellman recommendation to repeat D & C and then for patient to return to [facility] if further evaluation is needed.

## 2015-03-12 ENCOUNTER — Telehealth: Payer: Self-pay | Admitting: *Deleted

## 2015-03-12 ENCOUNTER — Other Ambulatory Visit: Payer: Self-pay | Admitting: Family

## 2015-03-12 NOTE — Telephone Encounter (Signed)
Returned call

## 2015-03-12 NOTE — Telephone Encounter (Signed)
Return call to patient, left message to call back. 

## 2015-03-12 NOTE — Telephone Encounter (Signed)
Patient returning your call.

## 2015-03-12 NOTE — Telephone Encounter (Signed)
-----   Message from Megan Salon, MD sent at 03/11/2015  1:27 PM EDT ----- Please inform pt pathology is "atypical" but not diagnostic for cancer.  She needs a D&C.  Please schedule.  Advise I did communicate with gyn oncology and this was what was advised.

## 2015-03-12 NOTE — Telephone Encounter (Signed)
Patient notified that endometrial biopsy result shows atypical cells and D&C recommended per Dr Sabra Heck. Advised Dr Sabra Heck will review further at surgery consult. Patient will have to check available dates and call back to schedule.

## 2015-03-13 NOTE — Telephone Encounter (Signed)
Call to patient. Patient requests surgery date of 04-09-15. Surgery scheduling policy discussed and patient is agreeable. Consult appointment scheduled with Dr Sabra Heck for 03-20-15 to discuss biopsy results and planned procedure. Case request sent to central scheduling and anesthesia consult requested due to BMI and history of AFib.  Routing to provider for final review. Patient agreeable to disposition. Will close encounter.

## 2015-03-14 ENCOUNTER — Other Ambulatory Visit: Payer: Self-pay | Admitting: Obstetrics & Gynecology

## 2015-03-14 MED ORDER — METRONIDAZOLE IVPB CUSTOM: 500.0000 mg | Freq: Once | INTRAVENOUS | Status: DC

## 2015-03-20 ENCOUNTER — Ambulatory Visit (INDEPENDENT_AMBULATORY_CARE_PROVIDER_SITE_OTHER): Payer: BLUE CROSS/BLUE SHIELD | Admitting: Obstetrics & Gynecology

## 2015-03-20 ENCOUNTER — Ambulatory Visit (INDEPENDENT_AMBULATORY_CARE_PROVIDER_SITE_OTHER): Payer: BLUE CROSS/BLUE SHIELD | Admitting: Family

## 2015-03-20 ENCOUNTER — Ambulatory Visit: Payer: BLUE CROSS/BLUE SHIELD

## 2015-03-20 ENCOUNTER — Encounter: Payer: Self-pay | Admitting: Family

## 2015-03-20 VITALS — BP 130/60 | HR 84 | Resp 16 | Wt 316.0 lb

## 2015-03-20 VITALS — BP 120/70 | HR 90 | Temp 97.8°F | Resp 18 | Ht 65.0 in | Wt 318.0 lb

## 2015-03-20 DIAGNOSIS — I10 Essential (primary) hypertension: Secondary | ICD-10-CM

## 2015-03-20 DIAGNOSIS — N939 Abnormal uterine and vaginal bleeding, unspecified: Secondary | ICD-10-CM

## 2015-03-20 DIAGNOSIS — D518 Other vitamin B12 deficiency anemias: Secondary | ICD-10-CM | POA: Diagnosis not present

## 2015-03-20 DIAGNOSIS — C541 Malignant neoplasm of endometrium: Secondary | ICD-10-CM

## 2015-03-20 DIAGNOSIS — L989 Disorder of the skin and subcutaneous tissue, unspecified: Secondary | ICD-10-CM | POA: Diagnosis not present

## 2015-03-20 DIAGNOSIS — D510 Vitamin B12 deficiency anemia due to intrinsic factor deficiency: Secondary | ICD-10-CM

## 2015-03-20 DIAGNOSIS — E559 Vitamin D deficiency, unspecified: Secondary | ICD-10-CM

## 2015-03-20 DIAGNOSIS — J4540 Moderate persistent asthma, uncomplicated: Secondary | ICD-10-CM

## 2015-03-20 LAB — BASIC METABOLIC PANEL
BUN: 15 mg/dL (ref 6–23)
CALCIUM: 9.1 mg/dL (ref 8.4–10.5)
CO2: 30 meq/L (ref 19–32)
Chloride: 99 mEq/L (ref 96–112)
Creatinine, Ser: 0.76 mg/dL (ref 0.40–1.20)
GFR: 83.12 mL/min (ref >60.00)
GLUCOSE: 111 mg/dL — AB (ref 70–99)
POTASSIUM: 3.5 meq/L (ref 3.5–5.1)
Sodium: 139 mEq/L (ref 135–145)

## 2015-03-20 LAB — CBC WITH DIFFERENTIAL/PLATELET
BASOS ABS: 0 10*3/uL (ref 0.0–0.1)
Basophils Relative: 0.3 % (ref 0.0–3.0)
EOS ABS: 0.2 10*3/uL (ref 0.0–0.7)
EOS PCT: 1.9 % (ref 0.0–5.0)
HCT: 38.3 % (ref 36.0–46.0)
HEMOGLOBIN: 12.3 g/dL (ref 12.0–15.0)
LYMPHS ABS: 1.4 10*3/uL (ref 0.7–4.0)
Lymphocytes Relative: 16.1 % (ref 12.0–46.0)
MCHC: 32.2 g/dL (ref 30.0–36.0)
MCV: 83.6 fl (ref 78.0–100.0)
MONO ABS: 0.5 10*3/uL (ref 0.1–1.0)
Monocytes Relative: 6.1 % (ref 3.0–12.0)
NEUTROS PCT: 75.6 % (ref 43.0–77.0)
Neutro Abs: 6.7 10*3/uL (ref 1.4–7.7)
Platelets: 203 10*3/uL (ref 150.0–400.0)
RBC: 4.59 Mil/uL (ref 3.87–5.11)
RDW: 17.2 % — ABNORMAL HIGH (ref 11.5–15.5)
WBC: 8.8 10*3/uL (ref 4.0–10.5)

## 2015-03-20 LAB — VITAMIN B12: Vitamin B-12: >1500 pg/mL — ABNORMAL HIGH (ref 211–911)

## 2015-03-20 LAB — IRON: IRON: 37 ug/dL — AB (ref 42–145)

## 2015-03-20 LAB — VITAMIN D 25 HYDROXY (VIT D DEFICIENCY, FRACTURES): VITD: 27.74 ng/mL — AB (ref 30.00–100.00)

## 2015-03-20 MED ORDER — HYDROCODONE-ACETAMINOPHEN 7.5-325 MG PO TABS: 1.0000 | ORAL_TABLET | Freq: Four times a day (QID) | ORAL | Status: DC | PRN

## 2015-03-20 NOTE — Patient Instructions (Signed)
Please complete lab work prior to leaving. Call if skin issues on left leg worsen or do not improve. Apply bacitracin daily to the small open sore on your left shin.

## 2015-03-20 NOTE — Assessment & Plan Note (Signed)
Reports stable on symbicort, continue same.

## 2015-03-20 NOTE — Progress Notes (Signed)
Subjective:    Patient ID: [name], female    DOB: 1955/12/02, 59 y.o.   MRN: [id number]  HPI  [name] is a 59 yr old female who presents today for follow up.  1) HTN- maintained on lasix, hctz.   BP Readings from Last 3 Encounters:  03/20/15 120/70  03/01/15 124/60  12/18/14 126/80   2) Anemia- would like b12 injection today. She reports + fatigue- has upcoming apt with her endocrinologist for follow up of her TSH/synthroid. Lab Results  Component Value Date   WBC 10.2* 11/15/2014   HGB 13.4 11/15/2014   HCT 41.6 11/15/2014   MCV 87 11/15/2014   PLT 197 11/15/2014   3) Vit D deficiency- maintained on vitamin D- has been taking 3 times a week.  Rash- notes sore on left shin and an area of irritation  Review of Systems See HPI  Past Medical History  Diagnosis Date  . Allergic rhinitis   . Asthma   . Hypertension   . Hypothyroidism   . Anemia, iron deficiency   . Vitamin D deficiency   . Morbid obesity ([place])   . GERD (gastroesophageal reflux disease)   . Hypoparathyroidism ([place]) 01/07/2011  . Fatty liver 01/07/2012  . Headache(784.0)     occasional sinus headache   . Atrial fibrillation ([place]) August 2012    OFF XARELTO LAST MONTH DUE TO BLEEDING IN STOOL  . History of blood transfusion JULY 2015  . Fibroid 1974    fibroid cyst on left fallopian tube  . Abnormal Pap smear     years ago/no biopsy  . Bursitis of hip left  . Edema of both legs   . Foot ulcer ([place])     AREA HEALED RIGHT FOOT  . Seizures ([place])     infancy secondary to fever  . Nephrolithiasis 2/16, 9/16    SEES DR Risa Grill  . Thyroid cancer ([place]) 2011    THYROIDECTOMY DONE  . Uterine cancer ([place]) 2014    Mirena IUD  . PONV (postoperative nausea and vomiting)   . Pernicious anemia 02/20/2014    followed by Debbrah Alar  . Dysrhythmia     afib, followed by Dr. Stanford Breed   . Arthritis     back- lower  . Fibromyalgia     Social History   Social History  . Marital Status:  Single    Spouse Name: N/A  . Number of Children: 0  . Years of Education: N/A   Occupational History  . works in Insurance claims handler   . [employer]    Social History Main Topics  . Smoking status: Former Smoker -- 0.50 packs/day for 25 years    Types: Cigarettes    Start date: 12/24/1970    Quit date: 05/27/1995  . Smokeless tobacco: Never Used     Comment: quit smoking 19 years ago  . Alcohol Use: 0.0 oz/week    0 Standard drinks or equivalent per week     Comment: 1/2 glass per month  . Drug Use: No  . Sexual Activity:    Partners: Male   Other Topics Concern  . Not on file   Social History Narrative   Occupation: works in Insurance claims handler - Field seismologist   Single       Former Smoker - quit tobacco 12 years ago.  She was light smoker for 10 years.  Past Surgical History  Procedure Laterality Date  . Tonsillectomy and adenoidectomy    . Bunionectomy      bilateral  . Appendectomy    . Gastric bypass  1974  . Thyroidectomy  05/15/2010  . Cyst on ovary removed     . Ureteroscopy  03/03/2012    Procedure: URETEROSCOPY;  Surgeon: Bernestine Amass, MD;  Location: [facility];  Service: Urology;  Laterality: Left;  . Cystoscopy w/ retrogrades  03/03/2012    Procedure: CYSTOSCOPY WITH RETROGRADE PYELOGRAM;  Surgeon: Bernestine Amass, MD;  Location: [facility];  Service: Urology;  Laterality: Bilateral;  . Lithotripsy  03/2012  . Cystoscopy with retrograde pyelogram, ureteroscopy and stent placement Left 08/25/2012    Procedure: CYSTOSCOPY WITH RETROGRADE PYELOGRAM, URETEROSCOPY;  Surgeon: Bernestine Amass, MD;  [facility];  Service: Urology;  Laterality: Left;  . Holmium laser application Left 6/0/1093    Procedure: HOLMIUM LASER APPLICATION;  Surgeon: Bernestine Amass, MD;  [facility]: [facility];  Service: Urology;  Laterality: Left;  . Dilation and curettage of uterus N/A 02/21/2013    Procedure: DILATATION AND CURETTAGE;  Surgeon: Lyman Speller,  MD;  Location: [facility];  Service: Gynecology;  Laterality: N/A;  . Colonoscopy with propofol N/A 02/02/2014    Procedure: COLONOSCOPY WITH PROPOFOL;  Surgeon: Irene Shipper, MD;  [facility];  Service: Endoscopy;  Laterality: N/A;  . Esophagogastroduodenoscopy (egd) with propofol N/A 02/02/2014    Procedure: ESOPHAGOGASTRODUODENOSCOPY (EGD) WITH PROPOFOL;  Surgeon: Irene Shipper, MD;  [facility];  Service: Endoscopy;  Laterality: N/A;  . Amputation Right 05/18/2014    Procedure: RIGHT FIFTH RAY AMPUTATION FOOT;  Surgeon: Wylene Simmer, MD;  Location: [facility];  Service: Orthopedics;  Laterality: Right;  . Lithotripsy  2/16    Family History  Problem Relation Age of Onset  . Hypertension Father   . Diabetes Father   . Lung cancer Father   . Heart attack Father     MI at age 29  . Hypertension Mother   . Hyperthyroidism Mother   . Heart disease Mother   . Heart attack Mother     MI at age 73  . Asthma Brother   . Hypertension Brother     younger  . Heart disease Brother     older  . Colon cancer Neg Hx   . Esophageal cancer Neg Hx   . Stomach cancer Neg Hx   . Kidney disease Neg Hx   . Liver disease Neg Hx     Allergies  Allergen Reactions  . Other Anaphylaxis    NUTS  . Amoxicillin-Pot Clavulanate Other (See Comments)    headache  . Food Hives    Potato  . Penicillins Other (See Comments)    headache  . Tomato Hives    Current Outpatient Prescriptions on File Prior to Visit  Medication Sig Dispense Refill  . albuterol (PROVENTIL HFA;VENTOLIN HFA) 108 (90 BASE) MCG/ACT inhaler Inhale 2 puffs into the lungs every 6 (six) hours as needed for wheezing or shortness of breath.    [name] albuterol (PROVENTIL) (2.5 MG/3ML) 0.083% nebulizer solution Take 2.5 mg by nebulization every 6 (six) hours as needed for wheezing or shortness of breath.    . Ascorbic Acid (VITAMIN C CR) 1000 MG TBCR Take 1 tablet by mouth 2 (two) times a week. On Tuesday and Thursday.    [name]  aspirin EC 81 MG tablet Take 1 tablet (81 mg total) by mouth daily.  90 tablet 3  . BLACK COHOSH PO Take 60 mg by mouth daily.    . budesonide-formoterol (SYMBICORT) 160-4.5 MCG/ACT inhaler Inhale 2 puffs into the lungs 2 (two) times daily. 10.2 g 5  . calcitRIOL (ROCALTROL) 0.5 MCG capsule Take 1.5 mcg by mouth daily.     . calcium carbonate (TUMS - DOSED IN MG ELEMENTAL CALCIUM) 500 MG chewable tablet Chew 1 tablet by mouth daily.     . cyclobenzaprine (FLEXERIL) 10 MG tablet Take 10 mg by mouth at bedtime.    [name] diltiazem (CARTIA XT) 240 MG 24 hr capsule Take 1 capsule (240 mg total) by mouth daily. 90 capsule 1  . DIPHENHYDRAMINE-PSEUDOEPHED PO Take 1 tablet by mouth every 6 (six) hours as needed (for allegies).    . fluticasone (FLONASE) 50 MCG/ACT nasal spray Place 2 sprays into both nostrils daily as needed for allergies or rhinitis.     . furosemide (LASIX) 40 MG tablet TAKE 1 TABLET DAILY 90 tablet 1  . hydrochlorothiazide (HYDRODIURIL) 25 MG tablet TAKE 1 TABLET DAILY 90 tablet 1  . HYDROcodone-acetaminophen (NORCO) 7.5-325 MG tablet TK 1-2 TS PO Q 4-6 H PRN P  0  . ibuprofen (ADVIL,MOTRIN) 800 MG tablet TK 1 T PO Q 8 H PRN P  0  . levothyroxine (SYNTHROID, LEVOTHROID) 137 MCG tablet Take 411 mcg by mouth daily. Takes 3 tabs in AM    . loratadine (CLARITIN) 10 MG tablet Take 10 mg by mouth daily.    [name] MANGANESE PO Take 1,250 mg by mouth 2 (two) times daily.    . methocarbamol (ROBAXIN) 500 MG tablet Take 500 mg by mouth 2 (two) times daily.    [name] nystatin cream (MYCOSTATIN) Apply 1 application topically 2 (two) times daily as needed for dry skin.     [name] omeprazole (PRILOSEC) 40 MG capsule TAKE 1 CAPSULE DAILY 90 capsule 1  . promethazine (PHENERGAN) 25 MG tablet TK 1 T PO Q 4 TO 6 H PRN  0  . traMADol (ULTRAM) 50 MG tablet TAKE 1 TABLET BY MOUTH EVERY 6 HOURS AS NEEDED FOR FOR PAIN 30 tablet 0  . valsartan (DIOVAN) 80 MG tablet TAKE 1 TABLET DAILY 90 tablet 1  . VIRTRATE-K 1100-334 MG/5ML  solution   0  . vitamin B-12 (CYANOCOBALAMIN) 1000 MCG tablet Take 1,000 mcg by mouth daily.    . Vitamin D, Ergocalciferol, (DRISDOL) 50000 UNITS CAPS capsule TAKE 1 CAPSULE TWICE WEEKLY 26 capsule 1  . zafirlukast (ACCOLATE) 20 MG tablet TAKE 1 TABLET TWICE A DAY BEFORE A MEAL 180 tablet 1   Current Facility-Administered Medications on File Prior to Visit  Medication Dose Route Frequency Provider Last Rate Last Dose  . cyanocobalamin ((VITAMIN B-12)) injection 1,000 mcg  1,000 mcg Intramuscular Q30 days Debbrah Alar, NP   1,000 mcg at 01/18/15 1052    BP 120/70 mmHg  Pulse 90  Temp(Src) 97.8 F (36.6 C) (Oral)  Resp 18  Ht 5\' 5"  (1.651 m)  Wt 318 lb (144.244 kg)  BMI 52.92 kg/m2  LMP 05/26/2009       Objective:   Physical Exam  Constitutional: She is oriented to person, place, and time. She appears well-developed and well-nourished.  HENT:  Head: Normocephalic and atraumatic.  Cardiovascular: Normal rate, regular rhythm and normal heart sounds.   No murmur heard. Pulmonary/Chest: Effort normal and breath sounds normal. No respiratory distress. She has no wheezes.  Neurological: She is alert and oriented to person, place, and time.  Skin:  Small approx  0.5cm superficial sore noted left shin Left upper medial shin slight erythema/induration- non-tender, no warmth  Psychiatric: She has a normal mood and affect. Her behavior is normal. Judgment and thought content normal.          Assessment & Plan:  Skin lesion- advised pt to apply bacitracin and bandage until healed. Call if it does not heal in the next few weeks. Other area on medial shin has been unchanged x 6 weeks.  ? If it represents previous area of trauma or resolving hematoma. Advised pt to call if increased redness/pruritis/swelling occurs.

## 2015-03-20 NOTE — Assessment & Plan Note (Signed)
Obtain follow up vit D level, continue 3x weekly supplement.

## 2015-03-20 NOTE — Progress Notes (Signed)
59 y.o. G0P0000 SingleCaucasian female here for discussion of upcoming procedure.  D&C planned due to abnormal endometrial biopsy showing:  Endometrium, biopsy - MINUTE FRAGMENT OF HIGHLY ATYPICAL EPITHELIUM WITH CLEAR CELL CHANGE, SEE COMMENT.  Pt has hx of endometrial cancer diagnosed 01/03/13.  Pt referred to gyn/oncology.  Mirena IUD placement recommended.  This was placed 02/28/13 after MRI showed no myometrial invasion.  Pt did not follow up with gyn/oncology and was lost to follow up until she returned for an physical exam this spring--4/16.  Pap and endometrial biopsy were negative then.  Gyn/oncology did not recommended repeat MRI.  Pt reported then that she was not going to follow up with gyn/oncology unless there was some change so follow up endometrial biopsy was scheduled for six months.   When this was performed 03/01/15, she reported some spotting at that time.  Biopsy showed the above findings.  I communicated with Dr. Fermin Schwab who advised D&C for diagnosis before additional recommendations could be made.       Ob Hx:   Patient's last menstrual period was 05/26/2009.          Sexually active: Yes.   Birth control: IUD Last pap: 03/01/15 WNL, endometrial biopsy-atypical cells, h/o endometrial cancer Last MMG: 2008 Tobacco: former smoker  Past Surgical History  Procedure Laterality Date  . Tonsillectomy and adenoidectomy    . Bunionectomy      bilateral  . Appendectomy    . Gastric bypass  1974  . Thyroidectomy  05/15/2010  . Cyst on ovary removed     . Ureteroscopy  03/03/2012    Procedure: URETEROSCOPY;  Surgeon: Bernestine Amass, MD;  [facility];  Service: Urology;  Laterality: Left;  . Cystoscopy w/ retrogrades  03/03/2012    Procedure: CYSTOSCOPY WITH RETROGRADE PYELOGRAM;  Surgeon: Bernestine Amass, MD;  [facility];  Service: Urology;  Laterality: Bilateral;  . Lithotripsy  03/2012  . Cystoscopy with retrograde pyelogram, ureteroscopy and stent placement Left  08/25/2012    Procedure: CYSTOSCOPY WITH RETROGRADE PYELOGRAM, URETEROSCOPY;  Surgeon: Bernestine Amass, MD;  [facility];  Service: Urology;  Laterality: Left;  . Holmium laser application Left 0/05/7508    Procedure: HOLMIUM LASER APPLICATION;  Surgeon: Bernestine Amass, MD;  [facility];  Service: Urology;  Laterality: Left;  . Dilation and curettage of uterus N/A 02/21/2013    Procedure: DILATATION AND CURETTAGE;  Surgeon: Lyman Speller, MD;  Location: [facility];  Service: Gynecology;  Laterality: N/A;  . Colonoscopy with propofol N/A 02/02/2014    Procedure: COLONOSCOPY WITH PROPOFOL;  Surgeon: Irene Shipper, MD;  [facility];  Service: Endoscopy;  Laterality: N/A;  . Esophagogastroduodenoscopy (egd) with propofol N/A 02/02/2014    Procedure: ESOPHAGOGASTRODUODENOSCOPY (EGD) WITH PROPOFOL;  Surgeon: Irene Shipper, MD;  [facility];  Service: Endoscopy;  Laterality: N/A;  . Amputation Right 05/18/2014    Procedure: RIGHT FIFTH RAY AMPUTATION FOOT;  Surgeon: Wylene Simmer, MD;  Location: [facility];  Service: Orthopedics;  Laterality: Right;  . Lithotripsy  2/16    Past Medical History  Diagnosis Date  . Allergic rhinitis   . Asthma   . Hypertension   . Hypothyroidism   . Anemia, iron deficiency   . Vitamin D deficiency   . Morbid obesity ([place])   . GERD (gastroesophageal reflux disease)   . Hypoparathyroidism ([place]) 01/07/2011  . Fatty liver 01/07/2012  . Headache(784.0)     occasional sinus headache   .  Atrial fibrillation ([place]) August 2012    OFF XARELTO LAST MONTH DUE TO BLEEDING IN STOOL  . History of blood transfusion JULY 2015  . Fibroid 1974    fibroid cyst on left fallopian tube  . Abnormal Pap smear     years ago/no biopsy  . Bursitis of hip left  . Edema of both legs   . Foot ulcer ([place])     AREA HEALED RIGHT FOOT  . Seizures ([place])     infancy secondary to fever  . Nephrolithiasis 2/16, 9/16    SEES DR Risa Grill  . Thyroid cancer ([place]) 2011     THYROIDECTOMY DONE  . Uterine cancer ([place]) 2014    Mirena IUD  . PONV (postoperative nausea and vomiting)   . Pernicious anemia 02/20/2014    followed by Debbrah Alar  . Dysrhythmia     afib, followed by Dr. Stanford Breed   . Arthritis     back- lower  . Fibromyalgia     Allergies: Other; Amoxicillin-pot clavulanate; Food; Penicillins; and Tomato  Current Outpatient Prescriptions  Medication Sig Dispense Refill  . albuterol (PROVENTIL HFA;VENTOLIN HFA) 108 (90 BASE) MCG/ACT inhaler Inhale 2 puffs into the lungs every 6 (six) hours as needed for wheezing or shortness of breath.    [name] albuterol (PROVENTIL) (2.5 MG/3ML) 0.083% nebulizer solution Take 2.5 mg by nebulization every 6 (six) hours as needed for wheezing or shortness of breath.    . Ascorbic Acid (VITAMIN C CR) 1000 MG TBCR Take 1 tablet by mouth 2 (two) times a week. On Tuesday and Thursday.    [name] aspirin EC 81 MG tablet Take 1 tablet (81 mg total) by mouth daily. 90 tablet 3  . BLACK COHOSH PO Take 60 mg by mouth daily.    . budesonide-formoterol (SYMBICORT) 160-4.5 MCG/ACT inhaler Inhale 2 puffs into the lungs 2 (two) times daily. 10.2 g 5  . calcitRIOL (ROCALTROL) 0.5 MCG capsule Take 1.5 mcg by mouth daily.     . calcium carbonate (TUMS - DOSED IN MG ELEMENTAL CALCIUM) 500 MG chewable tablet Chew 1 tablet by mouth daily.     . cyclobenzaprine (FLEXERIL) 10 MG tablet Take 10 mg by mouth at bedtime.    [name] diltiazem (CARTIA XT) 240 MG 24 hr capsule Take 1 capsule (240 mg total) by mouth daily. 90 capsule 1  . DIPHENHYDRAMINE-PSEUDOEPHED PO Take 1 tablet by mouth every 6 (six) hours as needed (for allegies).    . fluticasone (FLONASE) 50 MCG/ACT nasal spray Place 2 sprays into both nostrils daily as needed for allergies or rhinitis.     . furosemide (LASIX) 40 MG tablet TAKE 1 TABLET DAILY 90 tablet 1  . hydrochlorothiazide (HYDRODIURIL) 25 MG tablet TAKE 1 TABLET DAILY 90 tablet 1  . HYDROcodone-acetaminophen (NORCO) 7.5-325 MG  tablet TK 1-2 TS PO Q 4-6 H PRN P  0  . ibuprofen (ADVIL,MOTRIN) 800 MG tablet TK 1 T PO Q 8 H PRN P  0  . levothyroxine (SYNTHROID, LEVOTHROID) 137 MCG tablet Take 411 mcg by mouth daily. Takes 3 tabs in AM    . loratadine (CLARITIN) 10 MG tablet Take 10 mg by mouth daily.    [name] MANGANESE PO Take 1,250 mg by mouth 2 (two) times daily.    . methocarbamol (ROBAXIN) 500 MG tablet Take 500 mg by mouth 2 (two) times daily.    [name] nystatin cream (MYCOSTATIN) Apply 1 application topically 2 (two) times daily as needed for dry skin.     [name]  omeprazole (PRILOSEC) 40 MG capsule TAKE 1 CAPSULE DAILY 90 capsule 1  . POTASSIUM CITRATE PO Take by mouth. Vita-K liquid 15    . promethazine (PHENERGAN) 25 MG tablet TK 1 T PO Q 4 TO 6 H PRN  0  . traMADol (ULTRAM) 50 MG tablet TAKE 1 TABLET BY MOUTH EVERY 6 HOURS AS NEEDED FOR FOR PAIN 30 tablet 0  . valsartan (DIOVAN) 80 MG tablet TAKE 1 TABLET DAILY 90 tablet 1  . VIRTRATE-K 1100-334 MG/5ML solution   0  . vitamin B-12 (CYANOCOBALAMIN) 1000 MCG tablet Take 1,000 mcg by mouth daily.    . Vitamin D, Ergocalciferol, (DRISDOL) 50000 UNITS CAPS capsule TAKE 1 CAPSULE TWICE WEEKLY 26 capsule 1  . zafirlukast (ACCOLATE) 20 MG tablet TAKE 1 TABLET TWICE A DAY BEFORE A MEAL 180 tablet 1   Current Facility-Administered Medications  Medication Dose Route Frequency Provider Last Rate Last Dose  . cyanocobalamin ((VITAMIN B-12)) injection 1,000 mcg  1,000 mcg Intramuscular Q30 days Debbrah Alar, NP   1,000 mcg at 03/20/15 1146    ROS: A comprehensive review of systems was negative.  Exam:    BP 130/60 mmHg  Pulse 84  Resp 16  Wt 316 lb (143.337 kg)  LMP 05/26/2009  General appearance: alert and cooperative Head: Normocephalic, without obvious abnormality, atraumatic Neck: no adenopathy, supple, symmetrical, trachea midline and thyroid not enlarged, symmetric, no tenderness/mass/nodules Lungs: clear to auscultation bilaterally Heart: regular rate and rhythm,  S1, S2 normal, no murmur, click, rub or gallop Abdomen: soft, non-tender; bowel sounds normal; no masses,  no organomegaly Extremities: extremities normal, atraumatic, no cyanosis or edema Skin: Skin color, texture, turgor normal. No rashes or lesions Lymph nodes: Cervical, supraclavicular, and axillary nodes normal. no inguinal nodes palpated Neurologic: Grossly normal  Pelvic:  Pt declined pelvic exam today.  A: PMP bleeding  H/O endometrial adenocarcinoma diagnosed 2014 Endometrial biopsy with abnormal findings 10/16    P:  D&C planned Rx for Motrin and Vicodin 7.5mg  given.  Medications/Vitamins reviewed.  Pt knows needs to stop her ASA.  ~15 minutes spent with patient >50% of time was in face to face discussion of above.'

## 2015-03-20 NOTE — Assessment & Plan Note (Signed)
Stable on current meds. Obtain follow up bmet.  

## 2015-03-20 NOTE — Assessment & Plan Note (Signed)
b12 and iron level today. B12 injection today. Cbc today due to fatigue

## 2015-03-20 NOTE — Progress Notes (Signed)
Pre visit review using our clinic review tool, if applicable. No additional management support is needed unless otherwise documented below in the visit note. 

## 2015-03-21 ENCOUNTER — Encounter: Payer: Self-pay | Admitting: Family

## 2015-03-22 ENCOUNTER — Encounter: Payer: Self-pay | Admitting: Family

## 2015-03-23 NOTE — Telephone Encounter (Signed)
[name], see mychart message. She will be establishing with you on 11.4.16.

## 2015-03-24 ENCOUNTER — Encounter: Payer: Self-pay | Admitting: Obstetrics & Gynecology

## 2015-03-26 ENCOUNTER — Telehealth: Payer: Self-pay | Admitting: Obstetrics & Gynecology

## 2015-03-26 NOTE — Telephone Encounter (Signed)
Called to review surgery benefit. Left voicemail to return call.

## 2015-03-29 ENCOUNTER — Other Ambulatory Visit: Payer: Self-pay | Admitting: *Deleted

## 2015-03-29 DIAGNOSIS — D510 Vitamin B12 deficiency anemia due to intrinsic factor deficiency: Secondary | ICD-10-CM

## 2015-03-30 ENCOUNTER — Ambulatory Visit (HOSPITAL_BASED_OUTPATIENT_CLINIC_OR_DEPARTMENT_OTHER): Payer: BLUE CROSS/BLUE SHIELD

## 2015-03-30 ENCOUNTER — Encounter: Payer: Self-pay | Admitting: Family

## 2015-03-30 ENCOUNTER — Other Ambulatory Visit (HOSPITAL_BASED_OUTPATIENT_CLINIC_OR_DEPARTMENT_OTHER): Payer: BLUE CROSS/BLUE SHIELD

## 2015-03-30 ENCOUNTER — Ambulatory Visit (HOSPITAL_BASED_OUTPATIENT_CLINIC_OR_DEPARTMENT_OTHER): Payer: BLUE CROSS/BLUE SHIELD | Admitting: Family

## 2015-03-30 VITALS — BP 142/84 | HR 86 | Temp 98.0°F | Resp 16 | Ht 65.0 in | Wt 319.0 lb

## 2015-03-30 VITALS — BP 115/61 | HR 80

## 2015-03-30 DIAGNOSIS — K909 Intestinal malabsorption, unspecified: Secondary | ICD-10-CM | POA: Diagnosis not present

## 2015-03-30 DIAGNOSIS — Z9884 Bariatric surgery status: Secondary | ICD-10-CM

## 2015-03-30 DIAGNOSIS — D508 Other iron deficiency anemias: Secondary | ICD-10-CM

## 2015-03-30 DIAGNOSIS — D510 Vitamin B12 deficiency anemia due to intrinsic factor deficiency: Secondary | ICD-10-CM

## 2015-03-30 DIAGNOSIS — D509 Iron deficiency anemia, unspecified: Secondary | ICD-10-CM

## 2015-03-30 LAB — CBC WITH DIFFERENTIAL (CANCER CENTER ONLY)
BASO#: 0 10*3/uL (ref 0.0–0.2)
BASO%: 0.4 % (ref 0.0–2.0)
EOS%: 1.9 % (ref 0.0–7.0)
Eosinophils Absolute: 0.2 10*3/uL (ref 0.0–0.5)
HEMATOCRIT: 36.4 % (ref 34.8–46.6)
HEMOGLOBIN: 11.4 g/dL — AB (ref 11.6–15.9)
LYMPH#: 1.7 10*3/uL (ref 0.9–3.3)
LYMPH%: 16.7 % (ref 14.0–48.0)
MCH: 27.1 pg (ref 26.0–34.0)
MCHC: 31.3 g/dL — ABNORMAL LOW (ref 32.0–36.0)
MCV: 87 fL (ref 81–101)
MONO#: 0.7 10*3/uL (ref 0.1–0.9)
MONO%: 6.5 % (ref 0.0–13.0)
NEUT%: 74.5 % (ref 39.6–80.0)
NEUTROS ABS: 7.5 10*3/uL — AB (ref 1.5–6.5)
PLATELETS: ADEQUATE 10*3/uL (ref 145–400)
RBC: 4.21 10*6/uL (ref 3.70–5.32)
RDW: 15.9 % — AB (ref 11.1–15.7)
WBC: 10 10*3/uL (ref 3.9–10.0)

## 2015-03-30 LAB — COMPREHENSIVE METABOLIC PANEL (CC13)
ALBUMIN: 3.3 g/dL — AB (ref 3.5–5.0)
ALK PHOS: 95 U/L (ref 40–150)
ALT: 34 U/L (ref 0–55)
ANION GAP: 10 meq/L (ref 3–11)
AST: 24 U/L (ref 5–34)
BILIRUBIN TOTAL: 0.39 mg/dL (ref 0.20–1.20)
BUN: 16.6 mg/dL (ref 7.0–26.0)
CO2: 25 mEq/L (ref 22–29)
Calcium: 8.6 mg/dL (ref 8.4–10.4)
Chloride: 103 mEq/L (ref 98–109)
Creatinine: 0.9 mg/dL (ref 0.6–1.1)
EGFR: 76 mL/min/{1.73_m2} — AB (ref >90)
GLUCOSE: 99 mg/dL (ref 70–140)
POTASSIUM: 3.7 meq/L (ref 3.5–5.1)
SODIUM: 138 meq/L (ref 136–145)
Total Protein: 7.1 g/dL (ref 6.4–8.3)

## 2015-03-30 LAB — RETICULOCYTES (CHCC)
ABS RETIC: 131.4 10*3/uL (ref 19.0–186.0)
RBC.: 4.24 MIL/uL (ref 3.87–5.11)
RETIC CT PCT: 3.1 % — AB (ref 0.4–2.3)

## 2015-03-30 MED ORDER — SODIUM CHLORIDE 0.9 % IV SOLN
INTRAVENOUS | Status: DC
  Administered 2015-03-30: 16:00:00 via INTRAVENOUS

## 2015-03-30 MED ORDER — SODIUM CHLORIDE 0.9 % IV SOLN
510.0000 mg | Freq: Once | INTRAVENOUS | Status: AC
  Administered 2015-03-30: 510 mg via INTRAVENOUS
  Filled 2015-03-30: qty 17

## 2015-03-30 NOTE — Progress Notes (Signed)
Hematology and Oncology Follow Up Visit  [name] [id number] April 07, 1956 59 y.o. 03/30/2015   Principle Diagnosis:  Iron deficiency anemia  Current Therapy:   IV iron as indicated    Interim History: [name] is here today for a follow-up. She is c/o fatigue and craving beef and dairy products. Her last dose of Feraheme was in November or last year.  She is now receiving B 12 injections at her PCP office.  She has had no SOB, dizziness, chest pain, palpitations, rash, cough or changes in bowel or bladder habits.  She is still having issues with her left foot and is currently wearing a brace. She still has numbness and tingling from her knees into her toes. This is not a new issue for her and is unchanged.  Her most recent pap showed atypical cells and she has an appointment with her gynecologist to discuss treatment and possible hysterectomy. She continues to spot occassionally.   She is eating well and staying hydrated. Her weight is unchanged.   Medications:    Medication List       This list is accurate as of: 03/30/15  2:27 PM.  Always use your most recent med list.               albuterol (2.5 MG/3ML) 0.083% nebulizer solution  Commonly known as:  PROVENTIL  Take 2.5 mg by nebulization every 6 (six) hours as needed for wheezing or shortness of breath.     albuterol 108 (90 BASE) MCG/ACT inhaler  Commonly known as:  PROVENTIL HFA;VENTOLIN HFA  Inhale 2 puffs into the lungs every 6 (six) hours as needed for wheezing or shortness of breath.     aspirin EC 81 MG tablet  Take 1 tablet (81 mg total) by mouth daily.     BLACK COHOSH PO  Take 60 mg by mouth daily.     budesonide-formoterol 160-4.5 MCG/ACT inhaler  Commonly known as:  SYMBICORT  Inhale 2 puffs into the lungs 2 (two) times daily.     calcitRIOL 0.5 MCG capsule  Commonly known as:  ROCALTROL  Take 1.5 mcg by mouth daily.     cyclobenzaprine 10 MG tablet  Commonly known as:  FLEXERIL  Take 10  mg by mouth at bedtime.     diltiazem 240 MG 24 hr capsule  Commonly known as:  CARTIA XT  Take 1 capsule (240 mg total) by mouth daily.     DIPHENHYDRAMINE-PSEUDOEPHED PO  Take 1 tablet by mouth every 6 (six) hours as needed (for allegies).     fluticasone 50 MCG/ACT nasal spray  Commonly known as:  FLONASE  Place 2 sprays into both nostrils daily as needed for allergies or rhinitis.     furosemide 40 MG tablet  Commonly known as:  LASIX  Take 40 mg by mouth daily.     hydrochlorothiazide 25 MG tablet  Commonly known as:  HYDRODIURIL  Take 25 mg by mouth daily.     HYDROcodone-acetaminophen 7.5-325 MG tablet  Commonly known as:  NORCO  Take 1-2 tablets by mouth every 6 (six) hours as needed for moderate pain.     levothyroxine 137 MCG tablet  Commonly known as:  SYNTHROID, LEVOTHROID  Take 411 mcg by mouth daily. Takes 3 tabs in AM     loratadine 10 MG tablet  Commonly known as:  CLARITIN  Take 10 mg by mouth daily.     MANGANESE PO  Take 1,250 mg by mouth 2 (two)  times daily.     methocarbamol 500 MG tablet  Commonly known as:  ROBAXIN  Take 500 mg by mouth 2 (two) times daily.     omeprazole 40 MG capsule  Commonly known as:  PRILOSEC  Take 40 mg by mouth daily.     traMADol 50 MG tablet  Commonly known as:  ULTRAM  Take 50 mg by mouth every 6 (six) hours as needed for moderate pain.     valsartan 80 MG tablet  Commonly known as:  DIOVAN  Take 80 mg by mouth daily.     VIRTRATE-K 1100-334 MG/5ML solution  Generic drug:  citric acid-potassium citrate  Take 15 mLs by mouth 2 (two) times daily.     vitamin B-12 1000 MCG tablet  Commonly known as:  CYANOCOBALAMIN  Take 1,000 mcg by mouth daily.     Vitamin D (Ergocalciferol) 50000 UNITS Caps capsule  Commonly known as:  DRISDOL  Take 50,000 Units by mouth 2 (two) times a week.     zafirlukast 20 MG tablet  Commonly known as:  ACCOLATE  Take 20 mg by mouth 2 (two) times daily before a meal.         Allergies:  Allergies  Allergen Reactions  . Other Anaphylaxis    NUTS  . Amoxicillin-Pot Clavulanate Other (See Comments)    headache  . Food Hives    Potato  . Penicillins Other (See Comments)    headache  . Tomato Hives    Past Medical History, Surgical history, Social history, and Family History were reviewed and updated.  Review of Systems: All other 10 point review of systems is negative.   Physical Exam:  vitals were not taken for this visit.  Wt Readings from Last 3 Encounters:  03/20/15 316 lb (143.337 kg)  03/20/15 318 lb (144.244 kg)  03/01/15 313 lb (141.976 kg)    Ocular: Sclerae unicteric, pupils equal, round and reactive to light Ear-nose-throat: Oropharynx clear, dentition fair Lymphatic: No cervical or supraclavicular adenopathy Lungs no rales or rhonchi, good excursion bilaterally Heart regular rate and rhythm, no murmur appreciated Abd soft, nontender, positive bowel sounds MSK no focal spinal tenderness, no joint edema Neuro: non-focal, well-oriented, appropriate affect Breasts: Deferred  Lab Results  Component Value Date   WBC 8.8 03/20/2015   HGB 12.3 03/20/2015   HCT 38.3 03/20/2015   MCV 83.6 03/20/2015   PLT 203.0 03/20/2015   Lab Results  Component Value Date   FERRITIN 1,175* 11/15/2014   IRON 37* 03/20/2015   TIBC 256 11/15/2014   UIBC 213 11/15/2014   IRONPCTSAT 17* 11/15/2014   Lab Results  Component Value Date   RETICCTPCT 2.0 11/15/2014   RBC 4.59 03/20/2015   RETICCTABS 96.2 11/15/2014   No results found for: [facility] Lab Results  Component Value Date   IGGSERUM 1250 06/09/2007   IGA 334 06/09/2007   IGMSERUM 244 06/09/2007   Lab Results  Component Value Date   TOTALPROTELP 7.0 06/09/2007     Chemistry      Component Value Date/Time   NA 139 03/20/2015 1133   K 3.5 03/20/2015 1133   CL 99 03/20/2015 1133   CO2 30 03/20/2015 1133   BUN 15 03/20/2015 1133   CREATININE 0.76  03/20/2015 1133   CREATININE 0.80 11/23/2013 0859      Component Value Date/Time   CALCIUM 9.1 03/20/2015 1133   CALCIUM 8.0* 05/27/2010 1855   ALKPHOS 100 11/15/2014 0900   AST 20 11/15/2014  0900   ALT 36* 11/15/2014 0900   BILITOT 0.4 11/15/2014 0900     Impression and Plan: [name] is 59 yo female with iron deficiency anemia secondary to gastric bypass and malabsorption. She is here today symptomatic with fatigued and craving red meats.  Her Hgb is down at 11.4 with an MCV of 87. We wills ee what her iron studies show.  We can go ahead and give her a dose of Feraheme today while she is here in the office.   We will plan to see her back in 4 months for labs and follow-up. She will contact us with any questions or concerns. We can certainly see her sooner if need be.   Eliezer Bottom, NP 11/4/20162:27 PM

## 2015-03-30 NOTE — Patient Instructions (Signed)

## 2015-04-02 LAB — IRON AND TIBC CHCC
%SAT: 13 % — AB (ref 21–57)
IRON: 32 ug/dL — AB (ref 41–142)
TIBC: 250 ug/dL (ref 236–444)
UIBC: 218 ug/dL (ref 120–384)

## 2015-04-02 LAB — FERRITIN CHCC: FERRITIN: 925 ng/mL — AB (ref 9–269)

## 2015-04-09 ENCOUNTER — Ambulatory Visit (HOSPITAL_COMMUNITY): Payer: BLUE CROSS/BLUE SHIELD | Admitting: Anesthesiology

## 2015-04-09 ENCOUNTER — Ambulatory Visit (HOSPITAL_COMMUNITY)
Admission: RE | Admit: 2015-04-09 | Discharge: 2015-04-09 | Disposition: A | Discharge status: Home / Self Care | Payer: BLUE CROSS/BLUE SHIELD | Source: Ambulatory Visit | Attending: Obstetrics & Gynecology | Admitting: Obstetrics & Gynecology

## 2015-04-09 ENCOUNTER — Encounter (HOSPITAL_COMMUNITY): Payer: Self-pay

## 2015-04-09 ENCOUNTER — Encounter (HOSPITAL_COMMUNITY)
Admission: RE | Disposition: A | Discharge status: Home / Self Care | Payer: Self-pay | Source: Ambulatory Visit | Attending: Obstetrics & Gynecology

## 2015-04-09 DIAGNOSIS — R87619 Unspecified abnormal cytological findings in specimens from cervix uteri: Secondary | ICD-10-CM | POA: Insufficient documentation

## 2015-04-09 DIAGNOSIS — Z6841 Body Mass Index (BMI) 40.0 and over, adult: Secondary | ICD-10-CM | POA: Insufficient documentation

## 2015-04-09 DIAGNOSIS — Z8542 Personal history of malignant neoplasm of other parts of uterus: Secondary | ICD-10-CM | POA: Insufficient documentation

## 2015-04-09 DIAGNOSIS — K219 Gastro-esophageal reflux disease without esophagitis: Secondary | ICD-10-CM | POA: Diagnosis not present

## 2015-04-09 DIAGNOSIS — Z30432 Encounter for removal of intrauterine contraceptive device: Secondary | ICD-10-CM | POA: Insufficient documentation

## 2015-04-09 DIAGNOSIS — I10 Essential (primary) hypertension: Secondary | ICD-10-CM | POA: Diagnosis not present

## 2015-04-09 DIAGNOSIS — N950 Postmenopausal bleeding: Secondary | ICD-10-CM | POA: Diagnosis not present

## 2015-04-09 HISTORY — PX: DILATION AND CURETTAGE OF UTERUS: SHX78

## 2015-04-09 LAB — CBC
HCT: 38.2 % (ref 36.0–46.0)
HEMOGLOBIN: 12 g/dL (ref 12.0–15.0)
MCH: 27.2 pg (ref 26.0–34.0)
MCHC: 31.4 g/dL (ref 30.0–36.0)
MCV: 86.6 fL (ref 78.0–100.0)
PLATELETS: 117 10*3/uL — AB (ref 150–400)
RBC: 4.41 MIL/uL (ref 3.87–5.11)
RDW: 16.5 % — AB (ref 11.5–15.5)
WBC: 8.1 10*3/uL (ref 4.0–10.5)

## 2015-04-09 SURGERY — DILATION AND CURETTAGE
Anesthesia: General

## 2015-04-09 MED ORDER — LIDOCAINE HCL (CARDIAC) 20 MG/ML IV SOLN
INTRAVENOUS | Status: AC
  Filled 2015-04-09: qty 5

## 2015-04-09 MED ORDER — METOCLOPRAMIDE HCL 5 MG/ML IJ SOLN
INTRAMUSCULAR | Status: AC
  Filled 2015-04-09: qty 2

## 2015-04-09 MED ORDER — ONDANSETRON HCL 4 MG/2ML IJ SOLN
INTRAMUSCULAR | Status: AC
  Filled 2015-04-09: qty 2

## 2015-04-09 MED ORDER — LACTATED RINGERS IV SOLN
INTRAVENOUS | Status: DC
  Administered 2015-04-09: 11:00:00 via INTRAVENOUS
  Administered 2015-04-09: 125 mL/h via INTRAVENOUS

## 2015-04-09 MED ORDER — ONDANSETRON HCL 4 MG/2ML IJ SOLN
INTRAMUSCULAR | Status: DC | PRN
  Administered 2015-04-09: 4 mg via INTRAVENOUS

## 2015-04-09 MED ORDER — LIDOCAINE-EPINEPHRINE 1 %-1:100000 IJ SOLN
INTRAMUSCULAR | Status: DC | PRN
  Administered 2015-04-09: 10 mL

## 2015-04-09 MED ORDER — LIDOCAINE-EPINEPHRINE 1 %-1:100000 IJ SOLN
INTRAMUSCULAR | Status: AC
  Filled 2015-04-09: qty 1

## 2015-04-09 MED ORDER — KETOROLAC TROMETHAMINE 30 MG/ML IJ SOLN
INTRAMUSCULAR | Status: DC | PRN
  Administered 2015-04-09: 30 mg via INTRAVENOUS

## 2015-04-09 MED ORDER — PROMETHAZINE HCL 25 MG/ML IJ SOLN: 6.2500 mg | INTRAMUSCULAR | Status: DC | PRN

## 2015-04-09 MED ORDER — FENTANYL CITRATE (PF) 250 MCG/5ML IJ SOLN
INTRAMUSCULAR | Status: AC
  Filled 2015-04-09: qty 25

## 2015-04-09 MED ORDER — GLYCOPYRROLATE 0.2 MG/ML IJ SOLN
INTRAMUSCULAR | Status: AC
  Filled 2015-04-09: qty 1

## 2015-04-09 MED ORDER — DEXAMETHASONE SODIUM PHOSPHATE 10 MG/ML IJ SOLN
INTRAMUSCULAR | Status: DC | PRN
  Administered 2015-04-09: 10 mg via INTRAVENOUS

## 2015-04-09 MED ORDER — PROPOFOL 10 MG/ML IV BOLUS
INTRAVENOUS | Status: DC | PRN
  Administered 2015-04-09: 150 mg via INTRAVENOUS

## 2015-04-09 MED ORDER — FENTANYL CITRATE (PF) 100 MCG/2ML IJ SOLN: 25.0000 ug | INTRAMUSCULAR | Status: DC | PRN

## 2015-04-09 MED ORDER — LIDOCAINE HCL (CARDIAC) 20 MG/ML IV SOLN
INTRAVENOUS | Status: DC | PRN
  Administered 2015-04-09: 100 mg via INTRAVENOUS

## 2015-04-09 MED ORDER — DEXAMETHASONE SODIUM PHOSPHATE 10 MG/ML IJ SOLN
INTRAMUSCULAR | Status: AC
  Filled 2015-04-09: qty 1

## 2015-04-09 MED ORDER — FENTANYL CITRATE (PF) 100 MCG/2ML IJ SOLN
INTRAMUSCULAR | Status: DC | PRN
  Administered 2015-04-09 (×5): 50 ug via INTRAVENOUS

## 2015-04-09 MED ORDER — SCOPOLAMINE 1 MG/3DAYS TD PT72: 1.0000 | MEDICATED_PATCH | Freq: Once | TRANSDERMAL | Status: DC

## 2015-04-09 MED ORDER — MIDAZOLAM HCL 2 MG/2ML IJ SOLN
INTRAMUSCULAR | Status: AC
  Filled 2015-04-09: qty 4

## 2015-04-09 MED ORDER — KETOROLAC TROMETHAMINE 30 MG/ML IJ SOLN
INTRAMUSCULAR | Status: AC
Start: 2015-04-09 — End: 2015-04-09
  Filled 2015-04-09: qty 1

## 2015-04-09 MED ORDER — GLYCOPYRROLATE 0.2 MG/ML IJ SOLN
INTRAMUSCULAR | Status: DC | PRN
  Administered 2015-04-09: 0.2 mg via INTRAVENOUS

## 2015-04-09 MED ORDER — PROPOFOL 10 MG/ML IV BOLUS
INTRAVENOUS | Status: AC
  Filled 2015-04-09: qty 20

## 2015-04-09 MED ORDER — MIDAZOLAM HCL 2 MG/2ML IJ SOLN
INTRAMUSCULAR | Status: DC | PRN
  Administered 2015-04-09: 1 mg via INTRAVENOUS

## 2015-04-09 MED ORDER — METOCLOPRAMIDE HCL 5 MG/ML IJ SOLN
INTRAMUSCULAR | Status: DC | PRN
  Administered 2015-04-09: 10 mg via INTRAVENOUS

## 2015-04-09 SURGICAL SUPPLY — 14 items
CATH ROBINSON RED A/P 16FR (CATHETERS) ×2 IMPLANT
CLOTH BEACON ORANGE TIMEOUT ST (SAFETY) ×2 IMPLANT
CONTAINER PREFILL 10% NBF 60ML (FORM) ×4 IMPLANT
DILATOR CANAL MILEX (MISCELLANEOUS) ×1 IMPLANT
GLOVE BIOGEL PI IND STRL 7.0 (GLOVE) ×2 IMPLANT
GLOVE BIOGEL PI INDICATOR 7.0 (GLOVE) ×2
GLOVE ECLIPSE 6.5 STRL STRAW (GLOVE) ×4 IMPLANT
GOWN STRL REUS W/TWL LRG LVL3 (GOWN DISPOSABLE) ×4 IMPLANT
NEEDLE HYPO 22GX1.5 SAFETY (NEEDLE) ×2 IMPLANT
PACK VAGINAL MINOR WOMEN LF (CUSTOM PROCEDURE TRAY) ×2 IMPLANT
PAD OB MATERNITY 4.3X12.25 (PERSONAL CARE ITEMS) ×2 IMPLANT
PAD PREP 24X48 CUFFED NSTRL (MISCELLANEOUS) ×2 IMPLANT
TOWEL OR 17X24 6PK STRL BLUE (TOWEL DISPOSABLE) ×4 IMPLANT
WATER STERILE IRR 1000ML POUR (IV SOLUTION) ×2 IMPLANT

## 2015-04-09 NOTE — Anesthesia Postprocedure Evaluation (Signed)
  Anesthesia Post-op Note  Patient: [name]  Procedure(s) Performed: Procedure(s) (LRB): DILATATION AND CURETTAGEand IUD removal (N/A)  Patient Location: PACU  Anesthesia Type: General  Level of Consciousness: awake and alert   Airway and Oxygen Therapy: Patient Spontanous Breathing  Post-op Pain: mild  Post-op Assessment: Post-op Vital signs reviewed, Patient's Cardiovascular Status Stable, Respiratory Function Stable, Patent Airway and No signs of Nausea or vomiting  Last Vitals:  Filed Vitals:   04/09/15 1215  BP: 102/45  Pulse: 73  Temp:   Resp: 14    Post-op Vital Signs: stable   Complications: No apparent anesthesia complications

## 2015-04-09 NOTE — Discharge Instructions (Addendum)
Dilation and Curettage or Vacuum Curettage, Care After Refer to this sheet in the next few weeks. These instructions provide you with information on caring for yourself after your procedure. Your health care provider may also give you more specific instructions. Your treatment has been planned according to current medical practices, but problems sometimes occur. Call your health care provider if you have any problems or questions after your procedure. WHAT TO EXPECT AFTER THE PROCEDURE After your procedure, it is typical to have light cramping and bleeding. This may last for 2 days to 2 weeks after the procedure. HOME CARE INSTRUCTIONS   Do not drive for 24 hours.  Wait 1 week before returning to strenuous activities.  Take your temperature 2 times a day for 4 days and write it down. Provide these temperatures to your health care provider if you develop a fever.  Avoid long periods of standing.  Avoid heavy lifting, pushing, or pulling. Do not lift anything heavier than 10 pounds (4.5 kg).  Limit stair climbing to once or twice a day.  Take rest periods often.  You may resume your usual diet.  Drink enough fluids to keep your urine clear or pale yellow.  Your usual bowel function should return. If you have constipation, you may:  Take a mild laxative with permission from your health care provider.  Add fruit and bran to your diet.  Drink more fluids.  Take showers instead of baths until your health care provider gives you permission to take baths.  Do not go swimming or use a hot tub until your health care provider approves.  Try to have someone with you or available to you the first 24-48 hours, especially if you were given a general anesthetic.  Do not douche, use tampons, or have sex (intercourse) for 2 weeks after the procedure.  Only take over-the-counter or prescription medicines as directed by your health care provider. Do not take aspirin. It can cause  bleeding.  Follow up with your health care provider as directed. SEEK MEDICAL CARE IF:   You have increasing cramps or pain that is not relieved with medicine.  You have abdominal pain that does not seem to be related to the same area of earlier cramping and pain.  You have bad smelling vaginal discharge.  You have a rash.  You are having problems with any medicine. SEEK IMMEDIATE MEDICAL CARE IF:   You have bleeding that is heavier than a normal menstrual period.  You have a fever.  You have chest pain.  You have shortness of breath.  You feel dizzy or feel like fainting.  You pass out.  You have pain in your shoulder strap area.  You have heavy vaginal bleeding with or without blood clots. MAKE SURE YOU:   Understand these instructions.  Will watch your condition.  Will get help right away if you are not doing well or get worse.   This information is not intended to replace advice given to you by your health care provider. Make sure you discuss any questions you have with your health care provider.   Document Released: 05/09/2000 Document Revised: 05/17/2013 Document Reviewed: 12/09/2012 Elsevier Interactive Patient Education 2016 [place] [facility] Care Instructions  Activity: Get plenty of rest for the remainder of the day. A responsible adult should stay with you for 24 hours following the procedure.  For the next 24 hours, DO NOT: -Drive a car -Paediatric nurse -Drink alcoholic beverages -Take any medication unless  instructed by your physician -Make any legal decisions or sign important papers.  Meals: Start with liquid foods such as gelatin or soup. Progress to regular foods as tolerated. Avoid greasy, spicy, heavy foods. If nausea and/or vomiting occur, drink only clear liquids until the nausea and/or vomiting subsides. Call your physician if vomiting continues.  Special Instructions/Symptoms: Your throat may feel dry or sore from  the anesthesia or the breathing tube placed in your throat during surgery. If this causes discomfort, gargle with warm salt water. The discomfort should disappear within 24 hours.  If you had a scopolamine patch placed behind your ear for the management of post- operative nausea and/or vomiting:  1. The medication in the patch is effective for 72 hours, after which it should be removed.  Wrap patch in a tissue and discard in the trash. Wash hands thoroughly with soap and water. 2. You may remove the patch earlier than 72 hours if you experience unpleasant side effects which may include dry mouth, dizziness or visual disturbances. 3. Avoid touching the patch. Wash your hands with soap and water after contact with the patch.

## 2015-04-09 NOTE — Op Note (Signed)
04/09/2015  11:48 AM  PATIENT:  [name]  59 y.o. female  PRE-OPERATIVE DIAGNOSIS:  history of endometrial cancer, atypical cells on endometrial biopsy  POST-OPERATIVE DIAGNOSIS:  history of endometrial cancer, atypical cells on endometrial   PROCEDURE:  Procedure(s): DILATATION AND CURETTAGEand IUD removal  SURGEON:  Dwayne Bulkley SUZANNE  ASSISTANTS: OR staff   ANESTHESIA:   LMA  ESTIMATED BLOOD LOSS: 5cc  BLOOD ADMINISTERED:none   FLUIDS: 1000ccLR  UOP: 50cc  SPECIMEN:  ecc and endometrial curettings   DISPOSITION OF SPECIMEN:  PATHOLOGY  FINDINGS: normal pelvic exam, normal appearing tissues  DESCRIPTION OF OPERATION: Patient was taken to the operating room.  She is placed in the supine position. SCDs were on her lower extremities and functioning properly. General anesthesia with an LMA was administered without difficulty. Dr. Jillyn Hidden oversaw case.  Legs were then placed in the [place] in the low lithotomy position. The legs were lifted to the high lithotomy position and the Betadine prep was used on the inner thighs perineum and vagina x3. Patient was draped in a normal standard fashion. An in and out catheterization with a red rubber Foley catheter was performed. Approximately 50 cc of clear urine was noted. A bivalve speculum was placed the vagina. The anterior lip of the cervix was grasped with single-tooth tenaculum.  A paracervical block of 1% lidocaine mixed one-to-one with epinephrine (1:100,000 units).  10 cc was used total.  Mirena IUD was removed.  The cervix is dilated up to #23 [facility] dilators. The endometrial cavity sounded to 8 cm.  Using a Kevorkian curette, an endocervical specimen was obtained.  Then using a #1 toothed curette, the endometrial cavity was curetted until a rough, gritty texture was felt throughout the cavity.  At this point, procedure was ended.  The tenaculum was removed from the anterior lip of the cervix. The speculum was removed  from the vagina. The prep was cleansed of the patient's skin. The legs are positioned back in the supine position. Sponge, lap, needle, initially counts were correct x2. Patient was taken to recovery in stable condition.  COUNTS:  YES  PLAN OF CARE: Transfer to PACU

## 2015-04-09 NOTE — Anesthesia Procedure Notes (Signed)
Procedure Name: LMA Insertion Date/Time: 04/09/2015 10:59 AM Performed by: Mayer Camel, Crista Nuon A Pre-anesthesia Checklist: Patient identified, Emergency Drugs available, Suction available, Patient being monitored and Timeout performed Patient Re-evaluated:Patient Re-evaluated prior to inductionOxygen Delivery Method: Circle system utilized Preoxygenation: Pre-oxygenation with 100% oxygen Intubation Type: IV induction Ventilation: Mask ventilation without difficulty LMA: LMA inserted LMA Size: 4.0 Number of attempts: 1 Tube secured with: Tape Dental Injury: Teeth and Oropharynx as per pre-operative assessment  Comments: Supreme LMA #4 used

## 2015-04-09 NOTE — Transfer of Care (Signed)
Immediate Anesthesia Transfer of Care Note  Patient: [name]  Procedure(s) Performed: Procedure(s) with comments: [place] IUD removal (N/A) - Patient weight 307lbs  Patient Location: PACU  Anesthesia Type:General  Level of Consciousness: awake, alert  and oriented  Airway & Oxygen Therapy: Patient Spontanous Breathing and Patient connected to nasal cannula oxygen  Post-op Assessment: Report given to RN, Post -op Vital signs reviewed and stable and Patient moving all extremities X 4  Post vital signs: Reviewed and stable  Last Vitals:  Filed Vitals:   04/09/15 1007  BP: 143/68  Pulse: 87  Temp: 36.9 C  Resp: 16    Complications: No apparent anesthesia complications

## 2015-04-09 NOTE — H&P (Signed)
[name] is an 59 y.o. female G0 SWF here for [facility] for additional evaluation of endometrial pathology.  Pt was diagnosed with endometrial adenoca 01/03/13.  Referral to gyn/onc was done.  MIR showed no myometrial invasion so Mirena IUD was recommended for treatment due to pt's co-morbid conditions.  This was placed 02/28/13.  Pt did not follow up with gyn/onc but did return to me until 4/16.  Pap and endometrial biopsy were formed and were negative.  Repeat MRI was not recommended by gyn onc.  Pt declined gyn/onc follow up so repeat pap and endometrial biopsy was done 03/01/15.  Biopsy showed minute fragment so atypical epithelium with clear cell change.  I communicated with Dr. Fermin Schwab who recommended she proceed with [facility] for better diagnosis before returning for recommendations regarding treatment.  There really is no alternative to this for this pt except for doing nothing and I highly discouraged that option.  Risks and benefits have been explained and pt is here for procedure.    Pertinent Gynecological History: Menses: post-menopausal and spotting Bleeding: postmenopausal spotting Contraception: PMP DES exposure: denies Blood transfusions: none Sexually transmitted diseases: no past history Previous GYN Procedures: as above  Last mammogram: normal Date: 2008, pt has declined additional MMGs.  She  Knows I disagree with this. Last pap: normal Date: 03/01/15 OB History: G0, P0   Menstrual History: Patient's last menstrual period was 05/26/2009.    Past Medical History  Diagnosis Date  . Allergic rhinitis   . Asthma   . Hypertension   . Hypothyroidism   . Anemia, iron deficiency   . Vitamin D deficiency   . Morbid obesity ([place])   . GERD (gastroesophageal reflux disease)   . Hypoparathyroidism ([place]) 01/07/2011  . Fatty liver 01/07/2012  . Headache(784.0)     occasional sinus headache   . Atrial fibrillation ([place]) August 2012    OFF XARELTO LAST MONTH DUE TO BLEEDING IN STOOL   . History of blood transfusion JULY 2015  . Fibroid 1974    fibroid cyst on left fallopian tube  . Abnormal Pap smear     years ago/no biopsy  . Bursitis of hip left  . Edema of both legs   . Foot ulcer ([place])     AREA HEALED RIGHT FOOT  . Seizures ([place])     infancy secondary to fever  . Nephrolithiasis 2/16, 9/16    SEES DR Risa Grill  . Thyroid cancer ([place]) 2011    THYROIDECTOMY DONE  . Uterine cancer ([place]) 2014    Mirena IUD  . PONV (postoperative nausea and vomiting)   . Pernicious anemia 02/20/2014    followed by Debbrah Alar  . Dysrhythmia     afib, followed by Dr. Stanford Breed   . Arthritis     back- lower  . Fibromyalgia     Past Surgical History  Procedure Laterality Date  . Tonsillectomy and adenoidectomy    . Bunionectomy      bilateral  . Appendectomy    . Gastric bypass  1974  . Thyroidectomy  05/15/2010  . Cyst on ovary removed     . Ureteroscopy  03/03/2012    Procedure: URETEROSCOPY;  Surgeon: Bernestine Amass, MD;  Location: [facility];  Service: Urology;  Laterality: Left;  . Cystoscopy w/ retrogrades  03/03/2012    Procedure: CYSTOSCOPY WITH RETROGRADE PYELOGRAM;  Surgeon: Bernestine Amass, MD;  Location: [facility];  Service: Urology;  Laterality: Bilateral;  . Lithotripsy  03/2012  . Cystoscopy  with retrograde pyelogram, ureteroscopy and stent placement Left 08/25/2012    Procedure: CYSTOSCOPY WITH RETROGRADE PYELOGRAM, URETEROSCOPY;  Surgeon: Bernestine Amass, MD;  Location: [facility];  Service: Urology;  Laterality: Left;  . Holmium laser application Left 0000000    Procedure: HOLMIUM LASER APPLICATION;  Surgeon: Bernestine Amass, MD;  Location: [facility];  Service: Urology;  Laterality: Left;  . Dilation and curettage of uterus N/A 02/21/2013    Procedure: DILATATION AND CURETTAGE;  Surgeon: Lyman Speller, MD;  Location: [facility];  Service: Gynecology;  Laterality: N/A;  . Colonoscopy with propofol N/A 02/02/2014    Procedure: COLONOSCOPY WITH PROPOFOL;  Surgeon: Irene Shipper, MD;  Location: [facility];  Service: Endoscopy;  Laterality: N/A;  . Esophagogastroduodenoscopy (egd) with propofol N/A 02/02/2014    Procedure: ESOPHAGOGASTRODUODENOSCOPY (EGD) WITH PROPOFOL;  Surgeon: Irene Shipper, MD;  Location: [facility];  Service: Endoscopy;  Laterality: N/A;  . Amputation Right 05/18/2014    Procedure: RIGHT FIFTH RAY AMPUTATION FOOT;  Surgeon: Wylene Simmer, MD;  Location: [facility];  Service: Orthopedics;  Laterality: Right;  . Lithotripsy  2/16    Family History  Problem Relation Age of Onset  . Hypertension Father   . Diabetes Father   . Lung cancer Father   . Heart attack Father     MI at age 69  . Hypertension Mother   . Hyperthyroidism Mother   . Heart disease Mother   . Heart attack Mother     MI at age 69  . Asthma Brother   . Hypertension Brother     younger  . Heart disease Brother     older  . Colon cancer Neg Hx   . Esophageal cancer Neg Hx   . Stomach cancer Neg Hx   . Kidney disease Neg Hx   . Liver disease Neg Hx     Social History:  reports that she quit smoking about 19 years ago. Her smoking use included Cigarettes. She started smoking about 44 years ago. She has a 12.5 pack-year smoking history. She has never used smokeless tobacco. She reports that she drinks alcohol. She reports that she does not use illicit drugs.  Allergies:  Allergies  Allergen Reactions  . Other Anaphylaxis    NUTS  . Amoxicillin-Pot Clavulanate Other (See Comments)    headache  . Food Hives    Potato  . Penicillins Other (See Comments)    headache  . Tomato Hives    Facility-administered medications prior to admission  Medication Dose Route Frequency Provider Last Rate Last Dose  . cyanocobalamin ((VITAMIN B-12)) injection 1,000 mcg  1,000 mcg Intramuscular Q30 days Debbrah Alar, NP   1,000 mcg at 03/20/15 1146   Prescriptions prior to admission  Medication Sig Dispense Refill Last Dose  . aspirin EC 81 MG tablet Take 1 tablet (81 mg  total) by mouth daily. 90 tablet 3 03/25/2015 at 0800  . budesonide-formoterol (SYMBICORT) 160-4.5 MCG/ACT inhaler Inhale 2 puffs into the lungs 2 (two) times daily. 10.2 g 5 04/08/2015 at 1900  . calcitRIOL (ROCALTROL) 0.5 MCG capsule Take 1.5 mcg by mouth daily.    04/08/2015 at 0800  . cyclobenzaprine (FLEXERIL) 10 MG tablet Take 10 mg by mouth at bedtime.   04/08/2015 at 2200  . diltiazem (CARTIA XT) 240 MG 24 hr capsule Take 1 capsule (240 mg total) by mouth daily. 90 capsule 1 04/09/2015 at 0700  . fluticasone (FLONASE) 50 MCG/ACT nasal spray Place 2 sprays into  both nostrils daily as needed for allergies or rhinitis.    04/08/2015 at 1900  . furosemide (LASIX) 40 MG tablet Take 40 mg by mouth daily.   04/08/2015 at 0800  . hydrochlorothiazide (HYDRODIURIL) 25 MG tablet Take 25 mg by mouth daily.   04/09/2015 at 0700  . HYDROcodone-acetaminophen (NORCO) 7.5-325 MG tablet Take 1-2 tablets by mouth every 6 (six) hours as needed for moderate pain. 15 tablet 0 04/06/2015 at 1800  . loratadine (CLARITIN) 10 MG tablet Take 10 mg by mouth daily.   04/09/2015 at 0700  . MANGANESE PO Take 1,250 mg by mouth 2 (two) times daily.   04/06/2015 at 1900  . methocarbamol (ROBAXIN) 500 MG tablet Take 500 mg by mouth 2 (two) times daily.   04/08/2015 at 0800  . omeprazole (PRILOSEC) 40 MG capsule Take 40 mg by mouth daily.   04/09/2015 at  0700  . traMADol (ULTRAM) 50 MG tablet Take 50 mg by mouth every 6 (six) hours as needed for moderate pain.   04/05/2015  . valsartan (DIOVAN) 80 MG tablet Take 80 mg by mouth daily.   04/09/2015 at 0700  . Vitamin D, Ergocalciferol, (DRISDOL) 50000 UNITS CAPS capsule Take 50,000 Units by mouth 2 (two) times a week.   03/25/2015  . zafirlukast (ACCOLATE) 20 MG tablet Take 20 mg by mouth 2 (two) times daily before a meal.   04/08/2015 at 0800  . albuterol (PROVENTIL HFA;VENTOLIN HFA) 108 (90 BASE) MCG/ACT inhaler Inhale 2 puffs into the lungs every 6 (six) hours as needed for  wheezing or shortness of breath.   More than a month at Unknown time  . albuterol (PROVENTIL) (2.5 MG/3ML) 0.083% nebulizer solution Take 2.5 mg by nebulization every 6 (six) hours as needed for wheezing or shortness of breath.   More than a month at Unknown time  . BLACK COHOSH PO Take 60 mg by mouth daily.   More than a month at Unknown time  . DIPHENHYDRAMINE-PSEUDOEPHED PO Take 1 tablet by mouth every 6 (six) hours as needed (for allegies).   Unknown at Unknown time  . levothyroxine (SYNTHROID, LEVOTHROID) 137 MCG tablet Take 411 mcg by mouth daily. Takes 3 tabs in AM   04/08/2015 at 0800  . VIRTRATE-K 1100-334 MG/5ML solution Take 15 mLs by mouth 2 (two) times daily.   0 03/25/2015  . vitamin B-12 (CYANOCOBALAMIN) 1000 MCG tablet Take 1,000 mcg by mouth daily.   03/24/2015    Review of Systems  Musculoskeletal:       Numbness and tingling in feet, not new  All other systems reviewed and are negative.   Blood pressure 143/68, pulse 87, temperature 98.4 F (36.9 C), temperature source Oral, resp. rate 16, last menstrual period 05/26/2009, SpO2 98 %. Physical Exam  Constitutional: She is oriented to person, place, and time. She appears well-developed and well-nourished.  Cardiovascular: Normal rate and regular rhythm.   Respiratory: Effort normal and breath sounds normal.  Neurological: She is alert and oriented to person, place, and time.  Skin: Skin is warm and dry.  Psychiatric: She has a normal mood and affect.    No results found for this or any previous visit (from the past 24 hour(s)).  No results found.  Assessment/Plan: 59 yo G0 SWF with atypical epithelial cells with clear cell change on endometrial biopsy done 10/16.  Pt here for additional evaluation with D&C.  IUD will be removed as well.  All questions answered.  Pt here and ready to  proceed.  Hale Bogus SUZANNE 04/09/2015, 10:24 AM

## 2015-04-09 NOTE — Anesthesia Preprocedure Evaluation (Addendum)
Anesthesia Evaluation  Patient identified by MRN, date of birth, ID band Patient awake    Reviewed: Allergy & Precautions, H&P , NPO status , Patient's Chart, lab work & pertinent test results  History of Anesthesia Complications (+) PONV and history of anesthetic complications  Airway Mallampati: III  TM Distance: >3 FB Neck ROM: Full    Dental no notable dental hx.    Pulmonary asthma , COPD,  COPD inhaler, former smoker,    Pulmonary exam normal breath sounds clear to auscultation       Cardiovascular hypertension, Pt. on medications Normal cardiovascular exam+ dysrhythmias Atrial Fibrillation  Rhythm:Irregular Rate:Normal     Neuro/Psych  Headaches, neg Seizures  Neuromuscular disease negative psych ROS   GI/Hepatic GERD  Medicated and Controlled,Hx of fatty liver disease   Endo/Other  Hypothyroidism Morbid obesity  Renal/GU Renal disease  negative genitourinary   Musculoskeletal  (+) Arthritis , Fibromyalgia -  Abdominal   Peds negative pediatric ROS (+)  Hematology negative hematology ROS (+) anemia ,   Anesthesia Other Findings Pt denies glaucoma history, is NPO appropriate, denies current cardiac or pulmonary symptoms.[name] to same anesthetic plan as used in 2014 and understands risks of nausea, pain, sore throat, injury to teeth or lips, nerve injury, she has no further questions or concerns  Reproductive/Obstetrics negative OB ROS                          Anesthesia Physical  Anesthesia Plan  ASA: III  Anesthesia Plan: General   Post-op Pain Management:    Induction: Intravenous  Airway Management Planned: LMA  Additional Equipment: None  Intra-op Plan:   Post-operative Plan: Extubation in OR  Informed Consent: I have reviewed the patients History and Physical, chart, labs and discussed the procedure including the risks, benefits and alternatives for the proposed  anesthesia with the patient or authorized representative who has indicated his/her understanding and acceptance.   Dental advisory given  Plan Discussed with: CRNA and Anesthesiologist  Anesthesia Plan Comments: (Size 4.0 LMA used successfully in past multiple times, rec reglan 10mg  IV intraop with decadron and zofran for anti-emetic therapy Toradol intraop for pain relief)       Anesthesia Quick Evaluation

## 2015-04-10 ENCOUNTER — Encounter (HOSPITAL_COMMUNITY): Payer: Self-pay | Admitting: Obstetrics & Gynecology

## 2015-04-10 ENCOUNTER — Other Ambulatory Visit: Payer: Self-pay | Admitting: Family

## 2015-04-10 ENCOUNTER — Other Ambulatory Visit: Payer: Self-pay | Admitting: Internal Medicine

## 2015-04-10 NOTE — Addendum Note (Signed)
Addendum  created 04/10/15 1454 by Laverle Hobby, CRNA   Modules edited: Charges VN

## 2015-04-10 NOTE — Telephone Encounter (Signed)
Medication   valsartan (DIOVAN) 80 MG tablet [[id number]]       valsartan (DIOVAN) 80 MG tablet [id number] DISCONTINUED     Order Details    Dose, Route, Frequency: As Directed Order Comments:   D/C PREVIOUS SCRIPTS FOR THIS MEDICATION      Dispense Quantity:  90 tablet Refills:  1 Fills Remaining:  1          Sig: TAKE 1 TABLET DAILY   Patient not taking: Reported on 03/27/2015         Discontinue Date:  03/27/2015 1608 Discontinue User:  Jannette Spanner, CPHT Discontinue Reason:  Rx Refill Request - Entry Correction (re-entered below)   Written Date:  11/16/14 Expiration Date:  11/16/15     Start Date:  11/16/14 End Date:  03/27/15     Ordering Provider:  Debbrah Alar, NP Authorizing Provider:  Debbrah Alar, NP Ordering User:  Ronny Flurry, CMA                    Original Order:  valsartan (DIOVAN) 80 MG tablet [id number]        Pharmacy:  [facility]         Refill sent per [facility] refill protocol/SLS

## 2015-04-17 ENCOUNTER — Ambulatory Visit (INDEPENDENT_AMBULATORY_CARE_PROVIDER_SITE_OTHER): Payer: BLUE CROSS/BLUE SHIELD

## 2015-04-17 DIAGNOSIS — E538 Deficiency of other specified B group vitamins: Secondary | ICD-10-CM | POA: Diagnosis not present

## 2015-04-17 MED ORDER — CYANOCOBALAMIN 1000 MCG/ML IJ SOLN
1000.0000 ug | Freq: Once | INTRAMUSCULAR | Status: AC
  Administered 2015-04-17: 1000 ug via INTRAMUSCULAR

## 2015-04-17 NOTE — Progress Notes (Signed)
Patient in for B12 injection. 

## 2015-04-18 ENCOUNTER — Telehealth: Payer: Self-pay | Admitting: Obstetrics & Gynecology

## 2015-04-18 DIAGNOSIS — C541 Malignant neoplasm of endometrium: Secondary | ICD-10-CM

## 2015-04-18 NOTE — Telephone Encounter (Signed)
Note below is from Dr. Denman George, gyn/onc.  She agrees with replacing the Mirena IUD.  Please schedule pt for IUD placement.  Probably should precert just to be sure.  Hx of endometrial adenocarcinoma.  Dear Dr Sabra Heck,  I reviewed Ms Diamant's information.   I think it's a good plan to continue with the IUD.   I'm not sure that there is much of a role for resampling because we would not do surgery if it shows persistent cancer.  I would potentially only resample if she has significant bleeding through the IUD.  Otherwise I would have her continue the IUD until she needs a replacement.   In patients who are poor surgical candidates, our treatment with progesterone is rarely curative (only is curative in 10% of patients) and it is palliative for the rest (meaning it controls their bleeding, but their malignancy persists). In these women, particularly those with low grade cancers, their life expectancy is limited by their comorbidities and not their malignancy which they live with, having an indolent course.   Thanks  Emma  [phone]

## 2015-04-20 ENCOUNTER — Other Ambulatory Visit: Payer: Self-pay | Admitting: Family

## 2015-04-23 NOTE — Telephone Encounter (Signed)
Call to patient. She is given message from Dr. Sabra Heck.  She has two week post op appointment with Dr. Sabra Heck tomorrow. Okay to plan for IUD insertion.  Pre procedure instructions given.  Motrin instructions given. Motrin=Advil=Ibuprofen, 800 mg one hour before appointment. Eat a meal and hydrate well before appointment.  No cytotec per Dr. Sabra Heck.   Order placed for pre-certification.  cc Kerry Hough for insurance pre-certification.   Routing to provider for final review. Patient agreeable to disposition. Will close encounter.

## 2015-04-24 ENCOUNTER — Ambulatory Visit (INDEPENDENT_AMBULATORY_CARE_PROVIDER_SITE_OTHER): Payer: BLUE CROSS/BLUE SHIELD | Admitting: Obstetrics & Gynecology

## 2015-04-24 ENCOUNTER — Encounter: Payer: Self-pay | Admitting: Obstetrics & Gynecology

## 2015-04-24 VITALS — BP 124/80 | HR 64 | Resp 20 | Wt 317.0 lb

## 2015-04-24 DIAGNOSIS — C541 Malignant neoplasm of endometrium: Secondary | ICD-10-CM | POA: Diagnosis not present

## 2015-04-24 DIAGNOSIS — N939 Abnormal uterine and vaginal bleeding, unspecified: Secondary | ICD-10-CM

## 2015-04-24 NOTE — Progress Notes (Signed)
Subjective:     Patient ID: [name], female   DOB: [birth year], 59 y.o.   MRN: [id number]  HPI 59 yo G0 SWF with hx of endometrial cancer being treated with mirena IUD here for follow up after having D&C for additional evaluation after endometria lbiopsy showed fragment of highly atypical epithelium with clear cell component.  D&C and ECC obtained in the OR on 04/09/15.  Final pathology was negative.  I have communicated with Gyn/onc, Dr. Denman George, who advised continuing with IUD.  All of this was reviewed.  Questions answered.  Pt does want to proceed with IUD placement today.     She reports she has done well from surgical standpoint.  She is not having any bleeding or pain.  Pt has fallen in the last week.  She reports she doesn't feel this has anything to do with her surgery.    Review of Systems  Genitourinary: Negative for dysuria, vaginal bleeding, vaginal discharge, vaginal pain, menstrual problem and pelvic pain.       Objective:   Physical Exam  Constitutional: She is oriented to person, place, and time. She appears well-developed and well-nourished.  Genitourinary: Vagina normal and uterus normal. There is no rash, tenderness, lesion or injury on the right labia. There is no rash, tenderness, lesion or injury on the left labia. Cervix exhibits no discharge. Right adnexum displays no mass, no tenderness and no fullness. Left adnexum displays no mass, no tenderness and no fullness.  Lymphadenopathy:       Right: No inguinal adenopathy present.       Left: No inguinal adenopathy present.  Neurological: She is alert and oriented to person, place, and time.  Skin: Skin is warm and dry.  Psychiatric: She has a normal mood and affect.   After patient read information booklet and all questions were answered, informed consent was obtained.      Procedure:  Speculum inserted into vagina. Cervix visualized and cleansed with betadine solution X 3. Paracervical block placed:  no.    Tenaculum placed on cervix at 12 o'clock position.  Uterus sounded to 8 centimeters.  IUD and inserting device removed from sterile packet and under sterile conditions inserted to fundus of uterus.  IUD released and introducer removed without difficulty.  IUD string trimmed to 2 centimeters.  Remainder string given to patient to feel for identification.  Tenaculum removed.  No bleeding noted.  Speculum removed.  Uterus palpated normal.  Patient tolerated procedure well.  IUD Lot #: [id number].  Exp: 6/19.  Package information attached to consent and scanned into EPIC.     Assessment:     Endometrial adenocarcinoma Abnormal endometrial biopsy with D&C and ECC evaluation that was completely negative IUD placement today    Plan:     Removal for Mirena IUD 04/23/2020  Recheck 6/16, six months recheck

## 2015-05-09 ENCOUNTER — Ambulatory Visit: Payer: BLUE CROSS/BLUE SHIELD | Admitting: Cardiology

## 2015-05-10 ENCOUNTER — Other Ambulatory Visit: Payer: Self-pay | Admitting: Family

## 2015-05-11 NOTE — Telephone Encounter (Signed)
Melissa-- please advise request.  Looks like pt was due for repeat UDS in September. Pt next f/u is 08/2015.  Please advise?  Name from pharmacy:  In chart as:  TRAMADOL 50MG  TABLETS traMADol (ULTRAM) 50 MG tablet     Sig: TAKE 1 TABLET BY MOUTH EVERY 6 HOURS AS NEEDED FOR PAIN    Dispense: 30 tablet   Refills: 0   Start: 05/10/2015   Class: Normal    Requested on: 05/10/2015    Originally ordered on: 03/27/2015 05/08/2015

## 2015-05-13 NOTE — Telephone Encounter (Signed)
Ok to send refill.  We can do UDS at her follow up.

## 2015-05-14 NOTE — Telephone Encounter (Signed)
Rx printed and forwarded to PCP for signature. 

## 2015-05-15 NOTE — Telephone Encounter (Signed)
Rx faxed to pharmacy at 7:25am.

## 2015-05-17 ENCOUNTER — Ambulatory Visit (INDEPENDENT_AMBULATORY_CARE_PROVIDER_SITE_OTHER): Payer: BLUE CROSS/BLUE SHIELD

## 2015-05-17 DIAGNOSIS — E538 Deficiency of other specified B group vitamins: Secondary | ICD-10-CM | POA: Diagnosis not present

## 2015-05-17 MED ORDER — CYANOCOBALAMIN 1000 MCG/ML IJ SOLN: 1000.0000 ug | Freq: Once | INTRAMUSCULAR | Status: DC

## 2015-05-17 NOTE — Progress Notes (Addendum)
Pre visit review using our clinic review tool, if applicable. No additional management support is needed unless otherwise documented below in the visit note.  Patient in for B12 injection. Given Right deltoid. Patient tolerated well.  Patient complains of Rright knee "giving out" this morning. Walking with cane. States this has been going on off and on for 2 months. Patient currently under the care of an Orthopod. Offered patient an appointment today,patient refused. Offered transport to ED,patient refused. Asked patient if she is ok to drive states she is and has been driving ok. Upon observation patient has no swelling in Right knee at this time. Patient has no other symptoms.  Patient wheeled to her car at end of visit.

## 2015-06-01 ENCOUNTER — Telehealth: Payer: Self-pay | Admitting: *Deleted

## 2015-06-01 MED ORDER — FUROSEMIDE 40 MG PO TABS: 40.0000 mg | ORAL_TABLET | Freq: Every day | ORAL | Status: DC

## 2015-06-01 MED ORDER — VALSARTAN 80 MG PO TABS: ORAL_TABLET | ORAL | Status: DC

## 2015-06-01 MED ORDER — HYDROCHLOROTHIAZIDE 25 MG PO TABS: 25.0000 mg | ORAL_TABLET | Freq: Every day | ORAL | Status: DC

## 2015-06-01 NOTE — Telephone Encounter (Signed)
Received faxes from [facility] for refills of: furosemide, valsartan, HCTZ, Vitamin D2.  Refills sent on all except Vitamin D.  Please advise request?

## 2015-06-02 MED ORDER — VITAMIN D (ERGOCALCIFEROL) 1.25 MG (50000 UNIT) PO CAPS: 50000.0000 [IU] | ORAL_CAPSULE | ORAL | Status: DC

## 2015-06-15 ENCOUNTER — Ambulatory Visit (INDEPENDENT_AMBULATORY_CARE_PROVIDER_SITE_OTHER): Payer: Commercial Managed Care - HMO | Admitting: *Deleted

## 2015-06-15 DIAGNOSIS — E538 Deficiency of other specified B group vitamins: Secondary | ICD-10-CM

## 2015-06-15 MED ORDER — CYANOCOBALAMIN 1000 MCG/ML IJ SOLN
1000.0000 ug | Freq: Once | INTRAMUSCULAR | Status: AC
  Administered 2015-06-15: 1000 ug via INTRAMUSCULAR

## 2015-06-15 NOTE — Progress Notes (Signed)
Pre visit review using our clinic review tool, if applicable. No additional management support is needed unless otherwise documented below in the visit note.  Pt in for B12 injection. Injection given R deltoid, pt tolerated well.   Next appointment scheduled 07/20/2015.

## 2015-06-18 ENCOUNTER — Telehealth: Payer: Self-pay

## 2015-06-18 ENCOUNTER — Other Ambulatory Visit: Payer: Self-pay | Admitting: *Deleted

## 2015-06-18 MED ORDER — DILTIAZEM HCL ER COATED BEADS 240 MG PO CP24: 240.0000 mg | ORAL_CAPSULE | Freq: Every day | ORAL | Status: DC

## 2015-06-18 MED ORDER — OMEPRAZOLE 40 MG PO CPDR: 40.0000 mg | DELAYED_RELEASE_CAPSULE | Freq: Every day | ORAL | Status: DC

## 2015-06-18 NOTE — Telephone Encounter (Signed)
Refilled Omprazole 

## 2015-06-19 ENCOUNTER — Telehealth: Payer: Self-pay | Admitting: *Deleted

## 2015-06-19 MED ORDER — BUDESONIDE-FORMOTEROL FUMARATE 160-4.5 MCG/ACT IN AERO: 2.0000 | INHALATION_SPRAY | Freq: Two times a day (BID) | RESPIRATORY_TRACT | Status: DC

## 2015-06-19 NOTE — Telephone Encounter (Signed)
REceived fax from [facility] requesting refill of symbicort inhaler.  REfills sent.

## 2015-06-21 ENCOUNTER — Other Ambulatory Visit: Payer: Self-pay | Admitting: *Deleted

## 2015-06-21 MED ORDER — DILTIAZEM HCL ER COATED BEADS 240 MG PO CP24: 240.0000 mg | ORAL_CAPSULE | Freq: Every day | ORAL | Status: DC

## 2015-07-17 ENCOUNTER — Encounter: Payer: Self-pay | Admitting: Family Medicine

## 2015-07-17 ENCOUNTER — Ambulatory Visit (INDEPENDENT_AMBULATORY_CARE_PROVIDER_SITE_OTHER): Payer: Commercial Managed Care - HMO | Admitting: Family Medicine

## 2015-07-17 VITALS — BP 146/78 | HR 99 | Temp 99.2°F | Wt 318.0 lb

## 2015-07-17 DIAGNOSIS — E538 Deficiency of other specified B group vitamins: Secondary | ICD-10-CM

## 2015-07-17 DIAGNOSIS — J302 Other seasonal allergic rhinitis: Secondary | ICD-10-CM | POA: Diagnosis not present

## 2015-07-17 DIAGNOSIS — J209 Acute bronchitis, unspecified: Secondary | ICD-10-CM

## 2015-07-17 MED ORDER — LEVOCETIRIZINE DIHYDROCHLORIDE 5 MG PO TABS: 5.0000 mg | ORAL_TABLET | Freq: Every evening | ORAL | Status: DC

## 2015-07-17 MED ORDER — AZITHROMYCIN 250 MG PO TABS: ORAL_TABLET | ORAL | Status: DC

## 2015-07-17 MED ORDER — CYANOCOBALAMIN 1000 MCG/ML IJ SOLN
1000.0000 ug | Freq: Once | INTRAMUSCULAR | Status: AC
  Administered 2015-07-17: 1000 ug via INTRAMUSCULAR

## 2015-07-17 MED ORDER — PROMETHAZINE-DM 6.25-15 MG/5ML PO SYRP: 5.0000 mL | ORAL_SOLUTION | Freq: Four times a day (QID) | ORAL | Status: DC | PRN

## 2015-07-17 NOTE — Progress Notes (Signed)
Patient ID: [name], female    DOB: 1955/08/26  Age: 60 y.o. MRN: [id number]    Subjective:  Subjective HPI [name] presents for cough and congestion x 2 days.   Review of Systems  Constitutional: Positive for chills. Negative for fever, diaphoresis, appetite change, fatigue and unexpected weight change.  HENT: Positive for congestion, postnasal drip, rhinorrhea and sinus pressure.   Eyes: Negative for pain, redness and visual disturbance.  Respiratory: Positive for cough and shortness of breath. Negative for chest tightness and wheezing.   Cardiovascular: Negative for chest pain, palpitations and leg swelling.  Endocrine: Negative for cold intolerance, heat intolerance, polydipsia, polyphagia and polyuria.  Genitourinary: Negative for dysuria, frequency and difficulty urinating.  Allergic/Immunologic: Negative for environmental allergies.  Neurological: Negative for dizziness, light-headedness, numbness and headaches.    History Past Medical History  Diagnosis Date  . Allergic rhinitis   . Asthma   . Hypertension   . Hypothyroidism   . Anemia, iron deficiency   . Vitamin D deficiency   . Morbid obesity ([place])   . GERD (gastroesophageal reflux disease)   . Hypoparathyroidism ([place]) 01/07/2011  . Fatty liver 01/07/2012  . Headache(784.0)     occasional sinus headache   . Atrial fibrillation ([place]) August 2012    OFF XARELTO LAST MONTH DUE TO BLEEDING IN STOOL  . History of blood transfusion JULY 2015  . Fibroid 1974    fibroid cyst on left fallopian tube  . Abnormal Pap smear     years ago/no biopsy  . Bursitis of hip left  . Edema of both legs   . Foot ulcer ([place])     AREA HEALED RIGHT FOOT  . Seizures ([place])     infancy secondary to fever  . Nephrolithiasis 2/16, 9/16    SEES DR Risa Grill  . Thyroid cancer ([place]) 2011    THYROIDECTOMY DONE  . Uterine cancer ([place]) 2014    Mirena IUD  . PONV (postoperative nausea and vomiting)   . Pernicious anemia 02/20/2014     followed by Debbrah Alar  . Dysrhythmia     afib, followed by Dr. Stanford Breed   . Arthritis     back- lower  . Fibromyalgia     She has past surgical history that includes Tonsillectomy and adenoidectomy; Bunionectomy; Appendectomy; Gastric bypass (1974); Thyroidectomy (05/15/2010); cyst on ovary removed ; Ureteroscopy (03/03/2012); Cystoscopy w/ retrogrades (03/03/2012); Lithotripsy (03/2012); Cystoscopy with retrograde pyelogram, ureteroscopy and stent placement (Left, 08/25/2012); Holmium laser application (Left, 0000000); Dilation and curettage of uterus (N/A, 02/21/2013); Colonoscopy with propofol (N/A, 02/02/2014); Esophagogastroduodenoscopy (egd) with propofol (N/A, 02/02/2014); Amputation (Right, 05/18/2014); Lithotripsy (2/16); and Dilation and curettage of uterus (N/A, 04/09/2015).   Her family history includes Asthma in her brother; Diabetes in her father; Heart attack in her father and mother; Heart disease in her brother and mother; Hypertension in her brother, father, and mother; Hyperthyroidism in her mother; Lung cancer in her father. There is no history of Colon cancer, Esophageal cancer, Stomach cancer, Kidney disease, or Liver disease.She reports that she quit smoking about 20 years ago. Her smoking use included Cigarettes. She started smoking about 44 years ago. She has a 12.5 pack-year smoking history. She has never used smokeless tobacco. She reports that she drinks alcohol. She reports that she does not use illicit drugs.  Current Outpatient Prescriptions on File Prior to Visit  Medication Sig Dispense Refill  . albuterol (PROVENTIL HFA;VENTOLIN HFA) 108 (90 BASE) MCG/ACT inhaler Inhale 2 puffs into the  lungs every 6 (six) hours as needed for wheezing or shortness of breath.    [name] albuterol (PROVENTIL) (2.5 MG/3ML) 0.083% nebulizer solution Take 2.5 mg by nebulization every 6 (six) hours as needed for wheezing or shortness of breath.    [name] aspirin EC 81 MG tablet Take 1 tablet  (81 mg total) by mouth daily. 90 tablet 3  . BLACK COHOSH PO Take 60 mg by mouth daily.    . budesonide-formoterol (SYMBICORT) 160-4.5 MCG/ACT inhaler Inhale 2 puffs into the lungs 2 (two) times daily. 3 Inhaler 1  . calcitRIOL (ROCALTROL) 0.5 MCG capsule Take 1.5 mcg by mouth daily.     . cyclobenzaprine (FLEXERIL) 10 MG tablet Take 10 mg by mouth at bedtime.    [name] diltiazem (CARTIA XT) 240 MG 24 hr capsule Take 1 capsule (240 mg total) by mouth daily. 90 capsule 0  . DIPHENHYDRAMINE-PSEUDOEPHED PO Take 1 tablet by mouth every 6 (six) hours as needed (for allegies).    . fluticasone (FLONASE) 50 MCG/ACT nasal spray Place 2 sprays into both nostrils daily as needed for allergies or rhinitis.     . furosemide (LASIX) 40 MG tablet Take 1 tablet (40 mg total) by mouth daily. 90 tablet 1  . hydrochlorothiazide (HYDRODIURIL) 25 MG tablet Take 1 tablet (25 mg total) by mouth daily. 90 tablet 1  . HYDROcodone-acetaminophen (NORCO) 7.5-325 MG tablet Take 1-2 tablets by mouth every 6 (six) hours as needed for moderate pain. 15 tablet 0  . levothyroxine (SYNTHROID, LEVOTHROID) 137 MCG tablet Take 411 mcg by mouth daily. Takes 3 tabs in AM    . MANGANESE PO Take 1,250 mg by mouth 2 (two) times daily.    . methocarbamol (ROBAXIN) 500 MG tablet Take 500 mg by mouth 2 (two) times daily.    [name] omeprazole (PRILOSEC) 40 MG capsule Take 1 capsule (40 mg total) by mouth daily. 30 capsule 6  . traMADol (ULTRAM) 50 MG tablet TAKE 1 TABLET BY MOUTH EVERY 6 HOURS AS NEEDED FOR PAIN 30 tablet 0  . valsartan (DIOVAN) 80 MG tablet TAKE 1 TABLET DAILY (DISCONTINUE PREVIOUS SCRIPTS FOR THIS MEDICATION) 90 tablet 1  . VIRTRATE-K 1100-334 MG/5ML solution Take 15 mLs by mouth 2 (two) times daily.   0  . vitamin B-12 (CYANOCOBALAMIN) 1000 MCG tablet Take 1,000 mcg by mouth daily.    . Vitamin D, Ergocalciferol, (DRISDOL) 50000 units CAPS capsule Take 1 capsule (50,000 Units total) by mouth 2 (two) times a week. 24 capsule 1  .  zafirlukast (ACCOLATE) 20 MG tablet Take 20 mg by mouth 2 (two) times daily before a meal.     No current facility-administered medications on file prior to visit.     Objective:  Objective Physical Exam  Constitutional: She is oriented to person, place, and time. She appears well-developed and well-nourished.  HENT:  Right Ear: External ear normal.  Left Ear: External ear normal.  + PND + errythema  Eyes: Conjunctivae are normal. Right eye exhibits no discharge. Left eye exhibits no discharge.  Cardiovascular: Normal rate, regular rhythm and normal heart sounds.   No murmur heard. Pulmonary/Chest: Effort normal. No respiratory distress. She has decreased breath sounds. She has no wheezes. She has no rales. She exhibits no tenderness.  Musculoskeletal: She exhibits no edema.  Lymphadenopathy:    She has cervical adenopathy.  Neurological: She is alert and oriented to person, place, and time.  Nursing note and vitals reviewed.  BP 146/78 mmHg  Pulse 99  Temp(Src) 99.2 F (37.3 C) (Oral)  Wt 318 lb (144.244 kg)  SpO2 96%  LMP 05/26/2009 Wt Readings from Last 3 Encounters:  07/17/15 318 lb (144.244 kg)  04/24/15 317 lb (143.79 kg)  03/30/15 319 lb (144.697 kg)     Lab Results  Component Value Date   WBC 8.1 04/09/2015   HGB 12.0 04/09/2015   HCT 38.2 04/09/2015   PLT 117* 04/09/2015   GLUCOSE 99 03/30/2015   CHOL 122 03/30/2012   TRIG 107 03/30/2012   HDL 39* 03/30/2012   LDLCALC 62 03/30/2012   ALT 34 03/30/2015   AST 24 03/30/2015   NA 138 03/30/2015   K 3.7 03/30/2015   CL 99 03/20/2015   CREATININE 0.9 03/30/2015   BUN 16.6 03/30/2015   CO2 25 03/30/2015   TSH 0.477 12/31/2011   INR 1.33 03/02/2012    No results found.   Assessment & Plan:  Plan I have discontinued [name]'s loratadine. I am also having her start on levocetirizine, promethazine-dextromethorphan, and azithromycin. Additionally, I am having her maintain her calcitRIOL, levothyroxine,  fluticasone, albuterol, BLACK COHOSH PO, methocarbamol, cyclobenzaprine, albuterol, aspirin EC, vitamin B-12, MANGANESE PO, DIPHENHYDRAMINE-PSEUDOEPHED PO, VIRTRATE-K, HYDROcodone-acetaminophen, zafirlukast, traMADol, valsartan, furosemide, hydrochlorothiazide, Vitamin D (Ergocalciferol), omeprazole, budesonide-formoterol, and diltiazem. We will stop administering cyanocobalamin and cyanocobalamin. Additionally, we administered cyanocobalamin.  Meds ordered this encounter  Medications  . levocetirizine (XYZAL) 5 MG tablet    Sig: Take 1 tablet (5 mg total) by mouth every evening.    Dispense:  30 tablet    Refill:  5  . promethazine-dextromethorphan (PROMETHAZINE-DM) 6.25-15 MG/5ML syrup    Sig: Take 5 mLs by mouth 4 (four) times daily as needed.    Dispense:  118 mL    Refill:  0  . azithromycin (ZITHROMAX Z-PAK) 250 MG tablet    Sig: As directed    Dispense:  6 each    Refill:  0  . cyanocobalamin ((VITAMIN B-12)) injection 1,000 mcg    Sig:     Problem List Items Addressed This Visit    None    Visit Diagnoses    Acute bronchitis, unspecified organism    -  Primary    Relevant Medications    promethazine-dextromethorphan (PROMETHAZINE-DM) 6.25-15 MG/5ML syrup    azithromycin (ZITHROMAX Z-PAK) 250 MG tablet    Seasonal allergies        Relevant Medications    levocetirizine (XYZAL) 5 MG tablet    B12 deficiency        Relevant Medications    cyanocobalamin ((VITAMIN B-12)) injection 1,000 mcg (Completed)       Follow-up: Return if symptoms worsen or fail to improve.  Garnet Koyanagi, DO

## 2015-07-17 NOTE — Progress Notes (Signed)
Pre visit review using our clinic review tool, if applicable. No additional management support is needed unless otherwise documented below in the visit note. 

## 2015-07-17 NOTE — Patient Instructions (Signed)

## 2015-07-19 ENCOUNTER — Other Ambulatory Visit: Payer: Self-pay | Admitting: Family

## 2015-07-20 ENCOUNTER — Ambulatory Visit: Payer: Commercial Managed Care - HMO

## 2015-07-20 NOTE — Telephone Encounter (Signed)
Medication filled to pharmacy as requested.   

## 2015-07-29 ENCOUNTER — Other Ambulatory Visit: Payer: Self-pay | Admitting: Family

## 2015-07-30 ENCOUNTER — Other Ambulatory Visit (HOSPITAL_BASED_OUTPATIENT_CLINIC_OR_DEPARTMENT_OTHER): Payer: Commercial Managed Care - HMO

## 2015-07-30 ENCOUNTER — Ambulatory Visit (HOSPITAL_BASED_OUTPATIENT_CLINIC_OR_DEPARTMENT_OTHER): Payer: Commercial Managed Care - HMO | Admitting: Family

## 2015-07-30 ENCOUNTER — Ambulatory Visit: Payer: BLUE CROSS/BLUE SHIELD

## 2015-07-30 VITALS — BP 140/72 | HR 80 | Temp 98.4°F | Resp 18 | Ht 65.0 in | Wt 314.0 lb

## 2015-07-30 DIAGNOSIS — D509 Iron deficiency anemia, unspecified: Secondary | ICD-10-CM

## 2015-07-30 LAB — CBC WITH DIFFERENTIAL (CANCER CENTER ONLY)
BASO#: 0 10*3/uL (ref 0.0–0.2)
BASO%: 0.3 % (ref 0.0–2.0)
EOS%: 0.9 % (ref 0.0–7.0)
Eosinophils Absolute: 0.1 10*3/uL (ref 0.0–0.5)
HEMATOCRIT: 40.7 % (ref 34.8–46.6)
HEMOGLOBIN: 13 g/dL (ref 11.6–15.9)
LYMPH#: 1.6 10*3/uL (ref 0.9–3.3)
LYMPH%: 14.6 % (ref 14.0–48.0)
MCH: 26.3 pg (ref 26.0–34.0)
MCHC: 31.9 g/dL — ABNORMAL LOW (ref 32.0–36.0)
MCV: 82 fL (ref 81–101)
MONO#: 0.7 10*3/uL (ref 0.1–0.9)
MONO%: 6.7 % (ref 0.0–13.0)
NEUT%: 77.5 % (ref 39.6–80.0)
NEUTROS ABS: 8.3 10*3/uL — AB (ref 1.5–6.5)
Platelets: 231 10*3/uL (ref 145–400)
RBC: 4.94 10*6/uL (ref 3.70–5.32)
RDW: 16.5 % — AB (ref 11.1–15.7)
WBC: 10.7 10*3/uL — AB (ref 3.9–10.0)

## 2015-07-30 LAB — COMPREHENSIVE METABOLIC PANEL
ALT: 68 U/L — AB (ref 0–55)
ANION GAP: 12 meq/L — AB (ref 3–11)
AST: 36 U/L — AB (ref 5–34)
Albumin: 3.5 g/dL (ref 3.5–5.0)
Alkaline Phosphatase: 99 U/L (ref 40–150)
BILIRUBIN TOTAL: 0.41 mg/dL (ref 0.20–1.20)
BUN: 16.8 mg/dL (ref 7.0–26.0)
CALCIUM: 8.8 mg/dL (ref 8.4–10.4)
CHLORIDE: 102 meq/L (ref 98–109)
CO2: 25 mEq/L (ref 22–29)
CREATININE: 0.8 mg/dL (ref 0.6–1.1)
EGFR: 78 mL/min/{1.73_m2} — ABNORMAL LOW (ref >90)
Glucose: 83 mg/dl (ref 70–140)
Potassium: 3.5 mEq/L (ref 3.5–5.1)
Sodium: 139 mEq/L (ref 136–145)
TOTAL PROTEIN: 7.7 g/dL (ref 6.4–8.3)

## 2015-07-30 NOTE — Telephone Encounter (Signed)
Please advise.   Last filled 07/20/15 75 ml, 0 RF.

## 2015-07-30 NOTE — Progress Notes (Signed)
Hematology and Oncology Follow Up Visit  [name] [id number] Jun 24, 1955 60 y.o. 07/30/2015   Principle Diagnosis:  Iron deficiency anemia  Current Therapy:   IV iron as indicated    Interim History: [name] is here today for a follow-up. She is doing fairly well. She states that she has kidney stones and needs to follow up with her urologist in order to schedule a lithotripsy.  Her last dose of Feraheme was in November. Her iron saturation at that time was 13% and ferritin 925.   She is currently getting over a respiratory infection. This has exacerbated her asthma but she is starting feel a little better. She has finished a z-pack and is doing neb treatments every 6-8 hours. She also carries an inhaler with her.  She continues to receive her B 12 injections at her PCP office.  No fever, chills, n/v, cough, rash, dizziness, chest pain, abdominal pain or changes in bowel or bladder habits.  She occasionally has palpitations due to atrial fib.  She is still having issues with her left foot and wearing a brace. The neuropathy in her hands and feet is unchanged.  Gynecology continues to follow her closely and sees her every 6 months. She states that they are holding off on doing a hysterectomy for now. She has occasional spotting.  She is eating well and staying hydrated. Her weight is unchanged.   Medications:    Medication List       This list is accurate as of: 07/30/15  1:34 PM.  Always use your most recent med list.               albuterol (2.5 MG/3ML) 0.083% nebulizer solution  Commonly known as:  PROVENTIL  INHALE 1 VIAL VIA NEBULIZER EVERY 4 HOURS AS NEEDED WHEEZING     aspirin EC 81 MG tablet  Take 1 tablet (81 mg total) by mouth daily.     azithromycin 250 MG tablet  Commonly known as:  ZITHROMAX Z-PAK  As directed     BLACK COHOSH PO  Take 60 mg by mouth daily.     budesonide-formoterol 160-4.5 MCG/ACT inhaler  Commonly known as:  SYMBICORT  Inhale 2  puffs into the lungs 2 (two) times daily.     calcitRIOL 0.5 MCG capsule  Commonly known as:  ROCALTROL  Take 1.5 mcg by mouth daily.     cyclobenzaprine 10 MG tablet  Commonly known as:  FLEXERIL  Take 10 mg by mouth at bedtime.     diltiazem 240 MG 24 hr capsule  Commonly known as:  CARTIA XT  Take 1 capsule (240 mg total) by mouth daily.     DIPHENHYDRAMINE-PSEUDOEPHED PO  Take 1 tablet by mouth every 6 (six) hours as needed (for allegies).     fluticasone 50 MCG/ACT nasal spray  Commonly known as:  FLONASE  Place 2 sprays into both nostrils daily as needed for allergies or rhinitis.     furosemide 40 MG tablet  Commonly known as:  LASIX  Take 1 tablet (40 mg total) by mouth daily.     hydrochlorothiazide 25 MG tablet  Commonly known as:  HYDRODIURIL  Take 1 tablet (25 mg total) by mouth daily.     HYDROcodone-acetaminophen 7.5-325 MG tablet  Commonly known as:  NORCO  Take 1-2 tablets by mouth every 6 (six) hours as needed for moderate pain.     levocetirizine 5 MG tablet  Commonly known as:  XYZAL  Take 1  tablet (5 mg total) by mouth every evening.     levothyroxine 137 MCG tablet  Commonly known as:  SYNTHROID, LEVOTHROID  Take 411 mcg by mouth daily. Takes 3 tabs in AM     MANGANESE PO  Take 1,250 mg by mouth 2 (two) times daily.     methocarbamol 500 MG tablet  Commonly known as:  ROBAXIN  Take 500 mg by mouth 2 (two) times daily.     omeprazole 40 MG capsule  Commonly known as:  PRILOSEC  Take 1 capsule (40 mg total) by mouth daily.     promethazine-dextromethorphan 6.25-15 MG/5ML syrup  Commonly known as:  PROMETHAZINE-DM  Take 5 mLs by mouth 4 (four) times daily as needed.     traMADol 50 MG tablet  Commonly known as:  ULTRAM  TAKE 1 TABLET BY MOUTH EVERY 6 HOURS AS NEEDED FOR PAIN     valsartan 80 MG tablet  Commonly known as:  DIOVAN  TAKE 1 TABLET DAILY (DISCONTINUE PREVIOUS SCRIPTS FOR THIS MEDICATION)     VIRTRATE-K 1100-334 MG/5ML  solution  Generic drug:  citric acid-potassium citrate  Take 15 mLs by mouth 2 (two) times daily.     vitamin B-12 1000 MCG tablet  Commonly known as:  CYANOCOBALAMIN  Take 1,000 mcg by mouth daily.     Vitamin D (Ergocalciferol) 50000 units Caps capsule  Commonly known as:  DRISDOL  Take 1 capsule (50,000 Units total) by mouth 2 (two) times a week.     zafirlukast 20 MG tablet  Commonly known as:  ACCOLATE  Take 20 mg by mouth 2 (two) times daily before a meal.        Allergies:  Allergies  Allergen Reactions  . Other Anaphylaxis    NUTS  . Amoxicillin-Pot Clavulanate Other (See Comments)    headache  . Food Hives    Potato  . Penicillins Other (See Comments)    headache  . Tomato Hives    Past Medical History, Surgical history, Social history, and Family History were reviewed and updated.  Review of Systems: All other 10 point review of systems is negative.   Physical Exam:  height is 5\' 5"  (1.651 m) and weight is 314 lb (142.429 kg). Her oral temperature is 98.4 F (36.9 C). Her blood pressure is 140/72 and her pulse is 80. Her respiration is 18 and oxygen saturation is 97%.   Wt Readings from Last 3 Encounters:  07/30/15 314 lb (142.429 kg)  07/17/15 318 lb (144.244 kg)  04/24/15 317 lb (143.79 kg)    Ocular: Sclerae unicteric, pupils equal, round and reactive to light Ear-nose-throat: Oropharynx clear, dentition fair Lymphatic: No cervical supraclavicular or axillary adenopathy Lungs no rales or rhonchi, good excursion bilaterally Heart regular rate and rhythm, no murmur appreciated Abd soft, nontender, positive bowel sounds, no liver or spleen tip palpated on exam MSK no focal spinal tenderness, no joint edema Neuro: non-focal, well-oriented, appropriate affect Breasts: Deferred  Lab Results  Component Value Date   WBC 10.7* 07/30/2015   HGB 13.0 07/30/2015   HCT 40.7 07/30/2015   MCV 82 07/30/2015   PLT 231 07/30/2015   Lab Results  Component  Value Date   FERRITIN 925* 03/30/2015   IRON 32* 03/30/2015   TIBC 250 03/30/2015   UIBC 218 03/30/2015   IRONPCTSAT 13* 03/30/2015   Lab Results  Component Value Date   RETICCTPCT 3.1* 03/30/2015   RBC 4.94 07/30/2015   RETICCTABS 131.4 03/30/2015   No  results found for: [facility] Lab Results  Component Value Date   IGGSERUM 1250 06/09/2007   IGA 334 06/09/2007   IGMSERUM 244 06/09/2007   Lab Results  Component Value Date   TOTALPROTELP 7.0 06/09/2007     Chemistry      Component Value Date/Time   NA 138 03/30/2015 1355   NA 139 03/20/2015 1133   K 3.7 03/30/2015 1355   K 3.5 03/20/2015 1133   CL 99 03/20/2015 1133   CO2 25 03/30/2015 1355   CO2 30 03/20/2015 1133   BUN 16.6 03/30/2015 1355   BUN 15 03/20/2015 1133   CREATININE 0.9 03/30/2015 1355   CREATININE 0.76 03/20/2015 1133   CREATININE 0.80 11/23/2013 0859      Component Value Date/Time   CALCIUM 8.6 03/30/2015 1355   CALCIUM 9.1 03/20/2015 1133   CALCIUM 8.0* 05/27/2010 1855   ALKPHOS 95 03/30/2015 1355   ALKPHOS 100 11/15/2014 0900   AST 24 03/30/2015 1355   AST 20 11/15/2014 0900   ALT 34 03/30/2015 1355   ALT 36* 11/15/2014 0900   BILITOT 0.39 03/30/2015 1355   BILITOT 0.4 11/15/2014 0900     Impression and Plan: Ms. Siglin is 60 yo white female with iron deficiency anemia secondary to gastric bypass and malabsorption. She is currently getting over a respiratory infection and feeling a little fatigued.  She last received Feraheme in November. Her Hgb is now up to 13.0 with an MCV of 82.  We will see what her iron studies today show and bring her back later this week for an infusion if needed.  We will plan to see her back in 4 months for labs and follow-up. She will contact us with any questions or concerns. We can certainly see her sooner if need be.    Eliezer Bottom, NP 3/6/20171:34 PM

## 2015-07-30 NOTE — Progress Notes (Signed)
No treatment today per Sarah [place],NP.

## 2015-07-31 LAB — IRON AND TIBC
%SAT: 10 % — ABNORMAL LOW (ref 21–57)
Iron: 25 ug/dL — ABNORMAL LOW (ref 41–142)
TIBC: 261 ug/dL (ref 236–444)
UIBC: 236 ug/dL (ref 120–384)

## 2015-07-31 LAB — FERRITIN: Ferritin: 1526 ng/ml — ABNORMAL HIGH (ref 9–269)

## 2015-07-31 LAB — RETICULOCYTES: Reticulocyte Count: 2.3 % (ref 0.6–2.6)

## 2015-08-06 ENCOUNTER — Other Ambulatory Visit: Payer: Self-pay | Admitting: Cardiology

## 2015-08-06 NOTE — Telephone Encounter (Signed)
REFILL 

## 2015-08-10 ENCOUNTER — Other Ambulatory Visit: Payer: Self-pay | Admitting: Family

## 2015-08-14 NOTE — Telephone Encounter (Signed)
Pt has f/u 09/18/15.  Last UDS 10/2014 and next one to be obtained at upcoming appt. Rx printed and forwarded to PCP for signature.

## 2015-08-15 ENCOUNTER — Telehealth: Payer: Self-pay | Admitting: *Deleted

## 2015-08-15 DIAGNOSIS — J302 Other seasonal allergic rhinitis: Secondary | ICD-10-CM

## 2015-08-15 MED ORDER — LEVOCETIRIZINE DIHYDROCHLORIDE 5 MG PO TABS: 5.0000 mg | ORAL_TABLET | Freq: Every evening | ORAL | Status: DC

## 2015-08-15 NOTE — Telephone Encounter (Signed)
Received request from [facility] for levocetirizine. Rx sent.

## 2015-08-15 NOTE — Telephone Encounter (Signed)
Rx faxed to pharmacy on 08/14/15.

## 2015-08-17 ENCOUNTER — Ambulatory Visit (INDEPENDENT_AMBULATORY_CARE_PROVIDER_SITE_OTHER): Payer: Commercial Managed Care - HMO

## 2015-08-17 DIAGNOSIS — E538 Deficiency of other specified B group vitamins: Secondary | ICD-10-CM

## 2015-08-17 MED ORDER — CYANOCOBALAMIN 1000 MCG/ML IJ SOLN
1000.0000 ug | Freq: Once | INTRAMUSCULAR | Status: AC
  Administered 2015-08-17: 1000 ug via INTRAMUSCULAR

## 2015-08-17 NOTE — Progress Notes (Signed)
Pre visit review using our clinic review tool, if applicable. No additional management support is needed unless otherwise documented below in the visit note.  Patient in for B12 injection due to B-12 deficiency per orders from M.O'sullivan,PA. Given IM Left deltoid. Patient tolerated well. Return appointment given.

## 2015-08-17 NOTE — Progress Notes (Signed)
   Subjective:    Patient ID: [name], female    DOB: [birth year], 60 y.o.   MRN: [id number]  HPI    Review of Systems     Objective:   Physical Exam        Assessment & Plan:

## 2015-08-25 DIAGNOSIS — N200 Calculus of kidney: Secondary | ICD-10-CM

## 2015-08-25 HISTORY — DX: Calculus of kidney: N20.0

## 2015-09-14 ENCOUNTER — Ambulatory Visit: Payer: BLUE CROSS/BLUE SHIELD | Admitting: Obstetrics & Gynecology

## 2015-09-18 ENCOUNTER — Ambulatory Visit (INDEPENDENT_AMBULATORY_CARE_PROVIDER_SITE_OTHER): Payer: Commercial Managed Care - HMO | Admitting: Family

## 2015-09-18 ENCOUNTER — Ambulatory Visit: Payer: Commercial Managed Care - HMO

## 2015-09-18 ENCOUNTER — Encounter: Payer: Self-pay | Admitting: Family

## 2015-09-18 VITALS — BP 120/70 | HR 100 | Temp 97.9°F | Resp 18 | Ht 65.0 in | Wt 311.4 lb

## 2015-09-18 DIAGNOSIS — D510 Vitamin B12 deficiency anemia due to intrinsic factor deficiency: Secondary | ICD-10-CM | POA: Diagnosis not present

## 2015-09-18 DIAGNOSIS — E559 Vitamin D deficiency, unspecified: Secondary | ICD-10-CM

## 2015-09-18 DIAGNOSIS — Z85850 Personal history of malignant neoplasm of thyroid: Secondary | ICD-10-CM | POA: Diagnosis not present

## 2015-09-18 DIAGNOSIS — D509 Iron deficiency anemia, unspecified: Secondary | ICD-10-CM

## 2015-09-18 DIAGNOSIS — J4540 Moderate persistent asthma, uncomplicated: Secondary | ICD-10-CM

## 2015-09-18 DIAGNOSIS — I10 Essential (primary) hypertension: Secondary | ICD-10-CM

## 2015-09-18 LAB — VITAMIN D 25 HYDROXY (VIT D DEFICIENCY, FRACTURES): VITD: 24.36 ng/mL — AB (ref 30.00–100.00)

## 2015-09-18 MED ORDER — CYANOCOBALAMIN 1000 MCG/ML IJ SOLN
1000.0000 ug | Freq: Once | INTRAMUSCULAR | Status: AC
  Administered 2015-09-18: 1000 ug via INTRAMUSCULAR

## 2015-09-18 MED ORDER — DESOXIMETASONE 0.05 % EX CREA: TOPICAL_CREAM | Freq: Two times a day (BID) | CUTANEOUS | Status: DC

## 2015-09-18 NOTE — Patient Instructions (Addendum)
Please complete lab work prior to leaving. Please schedule a complete physical at the front desk.  

## 2015-09-18 NOTE — Assessment & Plan Note (Signed)
BP stable on current medications. Continue same. bmet up to date.

## 2015-09-18 NOTE — Assessment & Plan Note (Signed)
Obtain follow up vit D level, continue supplement.

## 2015-09-18 NOTE — Progress Notes (Signed)
Subjective:    Patient ID: [name], female    DOB: [birth year], 60 y.o.   MRN: [id number]  HPI  [name] is a 60 yr old female who presents today for follow up.  1) HTN- current BP meds include diltiazem, hctz, lasix, diovan.   BP Readings from Last 3 Encounters:  09/18/15 120/70  07/30/15 140/72  07/17/15 146/78   2) Anemia-  Lab Results  Component Value Date   WBC 10.7* 07/30/2015   HGB 13.0 07/30/2015   HCT 40.7 07/30/2015   MCV 82 07/30/2015   PLT 231 07/30/2015   3) Vit D deficiency- maintained on vit D supplement weekly.    4) Asthma- reports only needing albuterol 1 x a month.    5) Eczema- reports intermittent AC fossa rash  Review of Systems See HPI  Past Medical History  Diagnosis Date  . Allergic rhinitis   . Asthma   . Hypertension   . Hypothyroidism   . Anemia, iron deficiency   . Vitamin D deficiency   . Morbid obesity ([place])   . GERD (gastroesophageal reflux disease)   . Hypoparathyroidism ([place]) 01/07/2011  . Fatty liver 01/07/2012  . Headache(784.0)     occasional sinus headache   . Atrial fibrillation ([place]) August 2012    OFF XARELTO LAST MONTH DUE TO BLEEDING IN STOOL  . History of blood transfusion JULY 2015  . Fibroid 1974    fibroid cyst on left fallopian tube  . Abnormal Pap smear     years ago/no biopsy  . Bursitis of hip left  . Edema of both legs   . Foot ulcer ([place])     AREA HEALED RIGHT FOOT  . Seizures ([place])     infancy secondary to fever  . Nephrolithiasis 2/16, 9/16    SEES DR Risa Grill  . Thyroid cancer ([place]) 2009-09-11    THYROIDECTOMY DONE  . Uterine cancer ([place]) 09-11-12    Mirena IUD  . PONV (postoperative nausea and vomiting)   . Pernicious anemia 02/20/2014    followed by Debbrah Alar  . Dysrhythmia     afib, followed by Dr. Stanford Breed   . Arthritis     back- lower  . Fibromyalgia   . Kidney stone 09/12/15    passed on their own     Social History   Social History  . Marital Status: Single    Spouse  Name: N/A  . Number of Children: 0  . Years of Education: N/A   Occupational History  . works in Insurance claims handler   . DESIGN [employer]    Social History Main Topics  . Smoking status: Former Smoker -- 0.50 packs/day for 25 years    Types: Cigarettes    Start date: 12/24/1970    Quit date: 05/27/1995  . Smokeless tobacco: Never Used     Comment: quit smoking 19 years ago  . Alcohol Use: 0.0 oz/week    0 Standard drinks or equivalent per week     Comment: 1/2 glass per month  . Drug Use: No  . Sexual Activity:    Partners: Male   Other Topics Concern  . Not on file   Social History Narrative   Occupation: works in Insurance claims handler - Field seismologist   Single       Former Smoker - quit tobacco 12 years ago.  She was light smoker for 10 years.  Past Surgical History  Procedure Laterality Date  . Tonsillectomy and adenoidectomy    . Bunionectomy      bilateral  . Appendectomy    . Gastric bypass  1974  . Thyroidectomy  05/15/2010  . Cyst on ovary removed     . Ureteroscopy  03/03/2012    Procedure: URETEROSCOPY;  Surgeon: Bernestine Amass, MD;  [facility];  Service: Urology;  Laterality: Left;  . Cystoscopy w/ retrogrades  03/03/2012    Procedure: CYSTOSCOPY WITH RETROGRADE PYELOGRAM;  Surgeon: Bernestine Amass, MD;  [facility];  Service: Urology;  Laterality: Bilateral;  . Lithotripsy  03/2012  . Cystoscopy with retrograde pyelogram, ureteroscopy and stent placement Left 08/25/2012    Procedure: CYSTOSCOPY WITH RETROGRADE PYELOGRAM, URETEROSCOPY;  Surgeon: Bernestine Amass, MD;  [facility];  Service: Urology;  Laterality: Left;  . Holmium laser application Left 0000000    Procedure: HOLMIUM LASER APPLICATION;  Surgeon: Bernestine Amass, MD;  [facility];  Service: Urology;  Laterality: Left;  . Dilation and curettage of uterus N/A 02/21/2013    Procedure: DILATATION AND CURETTAGE;  Surgeon: Lyman Speller, MD;  Location: [facility];  Service: Gynecology;  Laterality: N/A;  . Colonoscopy with propofol N/A 02/02/2014    Procedure: COLONOSCOPY WITH PROPOFOL;  Surgeon: Irene Shipper, MD;  [facility];  Service: Endoscopy;  Laterality: N/A;  . Esophagogastroduodenoscopy (egd) with propofol N/A 02/02/2014    Procedure: ESOPHAGOGASTRODUODENOSCOPY (EGD) WITH PROPOFOL;  Surgeon: Irene Shipper, MD;  [facility];  Service: Endoscopy;  Laterality: N/A;  . Amputation Right 05/18/2014    Procedure: RIGHT FIFTH RAY AMPUTATION FOOT;  Surgeon: Wylene Simmer, MD;  Location: [facility];  Service: Orthopedics;  Laterality: Right;  . Lithotripsy  2/16  . Dilation and curettage of uterus N/A 04/09/2015    Procedure: [place] IUD removal;  Surgeon: Megan Salon, MD;  Location: [facility];  Service: Gynecology;  Laterality: N/A;  Patient weight 307lbs    Family History  Problem Relation Age of Onset  . Hypertension Father   . Diabetes Father   . Lung cancer Father   . Heart attack Father     MI at age 67  . Hypertension Mother   . Hyperthyroidism Mother   . Heart disease Mother   . Heart attack Mother     MI at age 74  . Asthma Brother   . Hypertension Brother     younger  . Heart disease Brother     older  . Colon cancer Neg Hx   . Esophageal cancer Neg Hx   . Stomach cancer Neg Hx   . Kidney disease Neg Hx   . Liver disease Neg Hx     Allergies  Allergen Reactions  . Other Anaphylaxis    NUTS  . Amoxicillin-Pot Clavulanate Other (See Comments)    headache  . Food Hives    Potato  . Penicillins Other (See Comments)    headache  . Tomato Hives    Current Outpatient Prescriptions on File Prior to Visit  Medication Sig Dispense Refill  . albuterol (PROVENTIL) (2.5 MG/3ML) 0.083% nebulizer solution INHALE 1 VIAL VIA NEBULIZER EVERY 4 HOURS AS NEEDED WHEEZING 75 mL 0  . aspirin EC 81 MG tablet Take 1 tablet (81 mg total) by mouth daily. 90 tablet 3  . BLACK COHOSH PO Take 60 mg by mouth  daily.    . budesonide-formoterol (SYMBICORT) 160-4.5 MCG/ACT inhaler Inhale  2 puffs into the lungs 2 (two) times daily. 3 Inhaler 1  . calcitRIOL (ROCALTROL) 0.5 MCG capsule Take 1.5 mcg by mouth daily.     . cyclobenzaprine (FLEXERIL) 10 MG tablet Take 10 mg by mouth at bedtime.    [name] diltiazem (CARDIZEM CD) 240 MG 24 hr capsule Take 1 capsule (240 mg total) by mouth daily. NEED OV. 90 capsule 0  . DIPHENHYDRAMINE-PSEUDOEPHED PO Take 1 tablet by mouth every 6 (six) hours as needed (for allegies).    . fluticasone (FLONASE) 50 MCG/ACT nasal spray Place 2 sprays into both nostrils daily as needed for allergies or rhinitis.     . furosemide (LASIX) 40 MG tablet Take 1 tablet (40 mg total) by mouth daily. 90 tablet 1  . hydrochlorothiazide (HYDRODIURIL) 25 MG tablet Take 1 tablet (25 mg total) by mouth daily. 90 tablet 1  . HYDROcodone-acetaminophen (NORCO) 7.5-325 MG tablet Take 1-2 tablets by mouth every 6 (six) hours as needed for moderate pain. 15 tablet 0  . levocetirizine (XYZAL) 5 MG tablet Take 1 tablet (5 mg total) by mouth every evening. 90 tablet 1  . levothyroxine (SYNTHROID, LEVOTHROID) 137 MCG tablet Take 411 mcg by mouth daily. Takes 3 tabs in AM    . MANGANESE PO Take 1,250 mg by mouth 2 (two) times daily.    . methocarbamol (ROBAXIN) 500 MG tablet Take 500 mg by mouth 2 (two) times daily.    [name] omeprazole (PRILOSEC) 40 MG capsule Take 1 capsule (40 mg total) by mouth daily. 30 capsule 6  . promethazine-dextromethorphan (PROMETHAZINE-DM) 6.25-15 MG/5ML syrup Take 5 mLs by mouth 4 (four) times daily as needed. 118 mL 0  . traMADol (ULTRAM) 50 MG tablet TAKE 1 TABLET BY MOUTH EVERY 6 HOURS AS NEEDED FOR FOR PAIN 30 tablet 0  . valsartan (DIOVAN) 80 MG tablet TAKE 1 TABLET DAILY (DISCONTINUE PREVIOUS SCRIPTS FOR THIS MEDICATION) 90 tablet 1  . VIRTRATE-K 1100-334 MG/5ML solution Take 15 mLs by mouth 2 (two) times daily.   0  . vitamin B-12 (CYANOCOBALAMIN) 1000 MCG tablet Take 1,000 mcg  by mouth daily.    . Vitamin D, Ergocalciferol, (DRISDOL) 50000 units CAPS capsule Take 1 capsule (50,000 Units total) by mouth 2 (two) times a week. 24 capsule 1  . zafirlukast (ACCOLATE) 20 MG tablet Take 20 mg by mouth 2 (two) times daily before a meal.     No current facility-administered medications on file prior to visit.    BP 120/70 mmHg  Pulse 100  Temp(Src) 97.9 F (36.6 C) (Oral)  Resp 18  Ht 5\' 5"  (1.651 m)  Wt 311 lb 6.4 oz (141.25 kg)  BMI 51.82 kg/m2  SpO2 96%  LMP 05/26/2009       Objective:   Physical Exam  Constitutional: She is oriented to person, place, and time. She appears well-developed.  Morbidly obese white female  HENT:  Head: Normocephalic and atraumatic.  Cardiovascular: Normal rate, regular rhythm and normal heart sounds.   No murmur heard. Pulmonary/Chest: Effort normal and breath sounds normal. No respiratory distress. She has no wheezes.  Musculoskeletal:  Trace bilateral LE edema  Neurological: She is alert and oriented to person, place, and time.  Psychiatric: She has a normal mood and affect. Her behavior is normal. Judgment and thought content normal.          Assessment & Plan:

## 2015-09-18 NOTE — Assessment & Plan Note (Signed)
b12 injection today.  

## 2015-09-18 NOTE — Progress Notes (Signed)
Pre visit review using our clinic review tool, if applicable. No additional management support is needed unless otherwise documented below in the visit note. 

## 2015-09-18 NOTE — Assessment & Plan Note (Signed)
Normal Hgb, management per hematology.

## 2015-09-18 NOTE — Addendum Note (Signed)
Addended by: Kelle Darting A on: 09/18/2015 10:54 AM   Modules accepted: Orders

## 2015-09-18 NOTE — Assessment & Plan Note (Signed)
Hypothyroid management per endo.

## 2015-09-18 NOTE — Assessment & Plan Note (Signed)
Stable on symbicort, continue same.

## 2015-09-19 ENCOUNTER — Other Ambulatory Visit: Payer: Self-pay | Admitting: *Deleted

## 2015-09-19 MED ORDER — VITAMIN D (ERGOCALCIFEROL) 1.25 MG (50000 UNIT) PO CAPS: 50000.0000 [IU] | ORAL_CAPSULE | ORAL | Status: DC

## 2015-09-19 NOTE — Progress Notes (Signed)
Notified pt of results. Vit D RX sent to [facility] in error. Cancelled Rx and re-sent to [facility]. Pt aware.

## 2015-10-18 ENCOUNTER — Ambulatory Visit (INDEPENDENT_AMBULATORY_CARE_PROVIDER_SITE_OTHER): Payer: Commercial Managed Care - HMO | Admitting: *Deleted

## 2015-10-18 DIAGNOSIS — E538 Deficiency of other specified B group vitamins: Secondary | ICD-10-CM

## 2015-10-18 MED ORDER — CYANOCOBALAMIN 1000 MCG/ML IJ SOLN
1000.0000 ug | Freq: Once | INTRAMUSCULAR | Status: AC
  Administered 2015-10-18: 1000 ug via INTRAMUSCULAR

## 2015-10-18 NOTE — Progress Notes (Signed)
Pre visit review using our clinic review tool, if applicable. No additional management support is needed unless otherwise documented below in the visit note.  Pt tolerated injection well.   Next appt: 11/16/15  Dorrene German, RN

## 2015-10-20 ENCOUNTER — Telehealth: Payer: Self-pay | Admitting: Family

## 2015-10-23 MED ORDER — TRAMADOL HCL 50 MG PO TABS: 50.0000 mg | ORAL_TABLET | Freq: Four times a day (QID) | ORAL | Status: DC | PRN

## 2015-10-23 NOTE — Telephone Encounter (Signed)
Rx placed at front desk for pick up and detailed message left on pt's voicemail that pt will need to pick up Rx and provide urine sample at that time and to call if any questions.

## 2015-10-23 NOTE — Telephone Encounter (Signed)
Last Tramadol Rx 08/14/15 Last UDS: 10/2014 Last OV: 08/2015 Next OV: 03/19/16  Rx printed and forwarded to PCP for signature. Pt will need to pick up Rx and provide UDS at that time.

## 2015-10-30 ENCOUNTER — Encounter: Payer: Self-pay | Admitting: Obstetrics & Gynecology

## 2015-10-30 ENCOUNTER — Ambulatory Visit (INDEPENDENT_AMBULATORY_CARE_PROVIDER_SITE_OTHER): Payer: Commercial Managed Care - HMO | Admitting: Obstetrics & Gynecology

## 2015-10-30 VITALS — BP 138/70 | HR 90 | Resp 18 | Wt 312.0 lb

## 2015-10-30 DIAGNOSIS — Z01419 Encounter for gynecological examination (general) (routine) without abnormal findings: Secondary | ICD-10-CM | POA: Diagnosis not present

## 2015-10-30 DIAGNOSIS — Z205 Contact with and (suspected) exposure to viral hepatitis: Secondary | ICD-10-CM

## 2015-10-30 DIAGNOSIS — Z124 Encounter for screening for malignant neoplasm of cervix: Secondary | ICD-10-CM | POA: Diagnosis not present

## 2015-10-30 NOTE — Progress Notes (Signed)
60 y.o. G0P0000 SingleCaucasianF here for annual exam.  Pt with hx of ednometrial adenocarcinoma diagnosed 01/03/13.  Saw Dr. Fermin Schwab in consultation.  Due to co-morbidities, he felt Mirena IUD was best option. MRI of abdomen and pelvis was done 01/31/14 and showed 10 mm endometrial thickness with focal expansion of the anterior junctional zone up to about 11 mm. IUD was placed 02/28/13 with Dr. Wayland Salinas. Pt never followed up with him.    I saw pt 9/15 with repeat endometrial biopsy showing inactive endometrium.  Repeat MRI not needed per Dr. Fermin Schwab.  Pt continued to decline follow up with gyn/onc.  I was performing yearly endometrial biopsies due to this fact.  Last fall, her endometrial biopsy had atypical cells on it.  D&C was performed with negative pathology.  Dr. Serita Grit recommendations were obtained.  Continued Mirena IUD use recommended as well as cessation of endometrial biopsies unless bleeding changed significantly.  Mirena IUD replaced 04/24/15.  Pt reports she continues to have occasional morning spotting.  It is actually improved with her current IUD.    Patient's last menstrual period was 05/26/2009.          Sexually active: Yes.    The current method of family planning is post menopausal status.    Exercising: Yes.    physical therapy, stretching Smoker:  Former   Health Maintenance: Pap:  03/01/15 Neg. 01/03/13 WNL/negative HR HPV h/o endometrial cancer, IUD placed 04/24/15 History of abnormal Pap:  yes MMG:  2011.  Declines MMG.  Aware I disagree with this decision and that mammograms do safe lives but she is clear about her decision.   Colonoscopy:  02/02/14 Polyps  BMD:   Never TDaP:  01/2013  Pneumonia vaccine(s):  04/2007  Zostavax:   No Hep C testing: Not obtained Screening Labs: PCP, Urine today: PCP   reports that she quit smoking about 20 years ago. Her smoking use included Cigarettes. She started smoking about 44 years ago. She has a 12.5 pack-year smoking  history. She has never used smokeless tobacco. She reports that she drinks alcohol. She reports that she does not use illicit drugs.  Past Medical History  Diagnosis Date  . Allergic rhinitis   . Asthma   . Hypertension   . Hypothyroidism   . Anemia, iron deficiency   . Vitamin D deficiency   . Morbid obesity ([place])   . GERD (gastroesophageal reflux disease)   . Hypoparathyroidism ([place]) 01/07/2011  . Fatty liver 01/07/2012  . Headache(784.0)     occasional sinus headache   . Atrial fibrillation ([place]) August 2012    OFF XARELTO LAST MONTH DUE TO BLEEDING IN STOOL  . History of blood transfusion JULY 2015  . Fibroid 1974    fibroid cyst on left fallopian tube  . Abnormal Pap smear     years ago/no biopsy  . Bursitis of hip left  . Edema of both legs   . Foot ulcer ([place])     AREA HEALED RIGHT FOOT  . Seizures ([place])     infancy secondary to fever  . Nephrolithiasis 2/16, 9/16    SEES DR Risa Grill  . Thyroid cancer ([place]) 2011    THYROIDECTOMY DONE  . Uterine cancer ([place]) 2014    Mirena IUD  . PONV (postoperative nausea and vomiting)   . Pernicious anemia 02/20/2014    followed by Debbrah Alar  . Dysrhythmia     afib, followed by Dr. Stanford Breed   . Arthritis     back-  lower  . Fibromyalgia   . Kidney stone 15-Sep-2015    passed on their own    Past Surgical History  Procedure Laterality Date  . Tonsillectomy and adenoidectomy    . Bunionectomy      bilateral  . Appendectomy    . Gastric bypass  1972/09/14  . Thyroidectomy  05/15/2010  . Cyst on ovary removed     . Ureteroscopy  03/03/2012    Procedure: URETEROSCOPY;  Surgeon: Bernestine Amass, MD;  [facility];  Service: Urology;  Laterality: Left;  . Cystoscopy w/ retrogrades  03/03/2012    Procedure: CYSTOSCOPY WITH RETROGRADE PYELOGRAM;  Surgeon: Bernestine Amass, MD;  [facility];  Service: Urology;  Laterality: Bilateral;  . Lithotripsy  03/2012  . Cystoscopy with retrograde pyelogram, ureteroscopy and stent  placement Left 08/25/2012    Procedure: CYSTOSCOPY WITH RETROGRADE PYELOGRAM, URETEROSCOPY;  Surgeon: Bernestine Amass, MD;  [facility];  Service: Urology;  Laterality: Left;  . Holmium laser application Left 0000000    Procedure: HOLMIUM LASER APPLICATION;  Surgeon: Bernestine Amass, MD;  [facility];  Service: Urology;  Laterality: Left;  . Dilation and curettage of uterus N/A 02/21/2013    Procedure: DILATATION AND CURETTAGE;  Surgeon: Lyman Speller, MD;  Location: [facility];  Service: Gynecology;  Laterality: N/A;  . Colonoscopy with propofol N/A 02/02/2014    Procedure: COLONOSCOPY WITH PROPOFOL;  Surgeon: Irene Shipper, MD;  [facility];  Service: Endoscopy;  Laterality: N/A;  . Esophagogastroduodenoscopy (egd) with propofol N/A 02/02/2014    Procedure: ESOPHAGOGASTRODUODENOSCOPY (EGD) WITH PROPOFOL;  Surgeon: Irene Shipper, MD;  [facility];  Service: Endoscopy;  Laterality: N/A;  . Amputation Right 05/18/2014    Procedure: RIGHT FIFTH RAY AMPUTATION FOOT;  Surgeon: Wylene Simmer, MD;  Location: [place];  Service: Orthopedics;  Laterality: Right;  . Lithotripsy  2/16  . Dilation and curettage of uterus N/A 04/09/2015    Procedure: [place] IUD removal;  Surgeon: Megan Salon, MD;  Location: [facility];  Service: Gynecology;  Laterality: N/A;  Patient weight 307lbs    Current Outpatient Prescriptions  Medication Sig Dispense Refill  . albuterol (PROVENTIL) (2.5 MG/3ML) 0.083% nebulizer solution INHALE 1 VIAL VIA NEBULIZER EVERY 4 HOURS AS NEEDED WHEEZING 75 mL 0  . aspirin EC 81 MG tablet Take 1 tablet (81 mg total) by mouth daily. 90 tablet 3  . budesonide-formoterol (SYMBICORT) 160-4.5 MCG/ACT inhaler Inhale 2 puffs into the lungs 2 (two) times daily. 3 Inhaler 1  . calcitRIOL (ROCALTROL) 0.5 MCG capsule Take 1.5 mcg by mouth daily.     . cyclobenzaprine (FLEXERIL) 10 MG tablet Take 10 mg by mouth at bedtime.    [name] desoximetasone (TOPICORT) 0.05 %  cream Apply topically 2 (two) times daily. 30 g 0  . diltiazem (CARDIZEM CD) 240 MG 24 hr capsule Take 1 capsule (240 mg total) by mouth daily. NEED OV. 90 capsule 0  . DIPHENHYDRAMINE-PSEUDOEPHED PO Take 1 tablet by mouth every 6 (six) hours as needed (for allegies).    . fluticasone (FLONASE) 50 MCG/ACT nasal spray Place 2 sprays into both nostrils daily as needed for allergies or rhinitis.     . furosemide (LASIX) 40 MG tablet Take 1 tablet (40 mg total) by mouth daily. 90 tablet 1  . hydrochlorothiazide (HYDRODIURIL) 25 MG tablet Take 1 tablet (25 mg total) by mouth daily. 90 tablet 1  . levocetirizine (XYZAL) 5 MG tablet Take 1  tablet (5 mg total) by mouth every evening. 90 tablet 1  . levothyroxine (SYNTHROID, LEVOTHROID) 137 MCG tablet Take 411 mcg by mouth daily. Takes 3 tabs in AM    . MANGANESE PO Take 1,250 mg by mouth 2 (two) times daily.    . methocarbamol (ROBAXIN) 500 MG tablet Take 500 mg by mouth 2 (two) times daily.    [name] omeprazole (PRILOSEC) 40 MG capsule Take 1 capsule (40 mg total) by mouth daily. 30 capsule 6  . promethazine-dextromethorphan (PROMETHAZINE-DM) 6.25-15 MG/5ML syrup Take 5 mLs by mouth 4 (four) times daily as needed. 118 mL 0  . traMADol (ULTRAM) 50 MG tablet Take 1 tablet (50 mg total) by mouth every 6 (six) hours as needed for moderate pain. 30 tablet 0  . valsartan (DIOVAN) 80 MG tablet TAKE 1 TABLET DAILY (DISCONTINUE PREVIOUS SCRIPTS FOR THIS MEDICATION) 90 tablet 1  . VIRTRATE-K 1100-334 MG/5ML solution Take 15 mLs by mouth 2 (two) times daily.   0  . vitamin B-12 (CYANOCOBALAMIN) 1000 MCG tablet Take 1,000 mcg by mouth daily.    . Vitamin D, Ergocalciferol, (DRISDOL) 50000 units CAPS capsule Take 1 capsule (50,000 Units total) by mouth 3 (three) times a week. 36 capsule 1  . zafirlukast (ACCOLATE) 20 MG tablet Take 20 mg by mouth 2 (two) times daily before a meal.     No current facility-administered medications for this visit.    Family History   Problem Relation Age of Onset  . Hypertension Father   . Diabetes Father   . Lung cancer Father   . Heart attack Father     MI at age 17  . Hypertension Mother   . Hyperthyroidism Mother   . Heart disease Mother   . Heart attack Mother     MI at age 64  . Asthma Brother   . Hypertension Brother     younger  . Heart disease Brother     older  . Colon cancer Neg Hx   . Esophageal cancer Neg Hx   . Stomach cancer Neg Hx   . Kidney disease Neg Hx   . Liver disease Neg Hx     ROS:  Pertinent items are noted in HPI.  Otherwise, a comprehensive ROS was negative.  Exam:   BP 138/70 mmHg  Pulse 90  Resp 18  Wt 312 lb (141.522 kg)  LMP 05/26/2009  Weight change: +5#     Ht Readings from Last 3 Encounters:  09/18/15 5\' 5"  (1.651 m)  07/30/15 5\' 5"  (1.651 m)  03/30/15 5\' 5"  (1.651 m)    General appearance: alert, cooperative and appears stated age Head: Normocephalic, without obvious abnormality, atraumatic Neck: no adenopathy, supple, symmetrical, trachea midline and thyroid normal to inspection and palpation Lungs: clear to auscultation bilaterally Breasts: normal appearance, no masses or tenderness Heart: regular rate and rhythm Abdomen: soft, non-tender; bowel sounds normal; no masses,  no organomegaly Extremities: extremities normal, atraumatic, no cyanosis or edema Skin: Skin color, texture, turgor normal. No rashes or lesions Lymph nodes: Cervical, supraclavicular, and axillary nodes normal. No abnormal inguinal nodes palpated Neurologic: Grossly normal  Pelvic: External genitalia:  no lesions              Urethra:  normal appearing urethra with no masses, tenderness or lesions              Bartholins and Skenes: normal  Vagina: normal appearing vagina with normal color and discharge, no lesions              Cervix: no lesions and IUD string noted              Pap taken: Yes.   Bimanual Exam:  Uterus:  normal size, contour, position, consistency,  mobility, non-tender              Adnexa: normal adnexa and no mass, fullness, tenderness               Rectovaginal: Confirms               Anus:  normal sphincter tone, no lesions  Chaperone was present for exam.  A:  Well Woman exam today H/O endometrial cancer diagnosed 2014, consult with Dr. Fermin Schwab recommended IUD placement which was done 10/14.  Pt declines gyn/onc follow-up.  IUD removed and D&C 1/16 due to atypical cells on endometrial biopsy.  Pathology negative.  IUD replaced 11/16.   Scant morning PMP spotting.  Biopsy not recommended by Dr. Denman George unless bleeding significantly changes. Atrophic vaginitis  Morbid obesity  H/O thyroidectomy due to thyroid cancer now with hypothyroidism and hypoparathyroidism  Afib, rate controlled  Nephrolithiasis with h/o microscopic hematuria Iron def anemia LE edema/foot pain  P: Mammogram highly recommended. Patient continues to decline this as he has in the past.    pap smear today.  Neg HR HPV obtained 2014. Pt does screening lab work with PCP and is also seen by hem/onc (Dr. Marin Olp)  Hep C antibody testing obtained today. return annually or prn

## 2015-10-31 LAB — HEPATITIS C ANTIBODY: HCV AB: NEGATIVE

## 2015-10-31 LAB — IPS PAP TEST WITH REFLEX TO HPV

## 2015-11-02 ENCOUNTER — Ambulatory Visit: Payer: BLUE CROSS/BLUE SHIELD | Admitting: Obstetrics & Gynecology

## 2015-11-16 ENCOUNTER — Ambulatory Visit (INDEPENDENT_AMBULATORY_CARE_PROVIDER_SITE_OTHER): Payer: Commercial Managed Care - HMO | Admitting: *Deleted

## 2015-11-16 DIAGNOSIS — E538 Deficiency of other specified B group vitamins: Secondary | ICD-10-CM

## 2015-11-16 MED ORDER — CYANOCOBALAMIN 1000 MCG/ML IJ SOLN
1000.0000 ug | Freq: Once | INTRAMUSCULAR | Status: AC
  Administered 2015-11-16: 1000 ug via INTRAMUSCULAR

## 2015-11-16 NOTE — Progress Notes (Signed)
Pre visit review using our clinic review tool, if applicable. No additional management support is needed unless otherwise documented below in the visit note.  Patient tolerated injection well.  Next appointment: 12/18/15  Dorrene German, RN

## 2015-11-19 ENCOUNTER — Encounter: Payer: Self-pay | Admitting: Family

## 2015-11-20 ENCOUNTER — Other Ambulatory Visit: Payer: Self-pay | Admitting: Family

## 2015-11-29 ENCOUNTER — Other Ambulatory Visit (HOSPITAL_BASED_OUTPATIENT_CLINIC_OR_DEPARTMENT_OTHER): Payer: Commercial Managed Care - HMO

## 2015-11-29 ENCOUNTER — Encounter: Payer: Self-pay | Admitting: Family

## 2015-11-29 ENCOUNTER — Ambulatory Visit (HOSPITAL_BASED_OUTPATIENT_CLINIC_OR_DEPARTMENT_OTHER): Payer: Commercial Managed Care - HMO | Admitting: Family

## 2015-11-29 VITALS — BP 129/52 | HR 81 | Temp 97.8°F | Resp 18 | Ht 65.0 in | Wt 312.0 lb

## 2015-11-29 DIAGNOSIS — D509 Iron deficiency anemia, unspecified: Secondary | ICD-10-CM

## 2015-11-29 DIAGNOSIS — D510 Vitamin B12 deficiency anemia due to intrinsic factor deficiency: Secondary | ICD-10-CM

## 2015-11-29 LAB — COMPREHENSIVE METABOLIC PANEL (CC13)
A/G RATIO: 1.2 (ref 1.2–2.2)
ALT: 40 IU/L — AB (ref 0–32)
AST: 25 IU/L (ref 0–40)
Albumin, Serum: 3.8 g/dL (ref 3.5–5.5)
Alkaline Phosphatase, S: 103 IU/L (ref 39–117)
BILIRUBIN TOTAL: 0.4 mg/dL (ref 0.0–1.2)
BUN/Creatinine Ratio: 25 — ABNORMAL HIGH (ref 9–23)
BUN: 19 mg/dL (ref 6–24)
CALCIUM: 8.4 mg/dL — AB (ref 8.7–10.2)
CHLORIDE: 98 mmol/L (ref 96–106)
Carbon Dioxide, Total: 21 mmol/L (ref 18–29)
Creatinine, Ser: 0.75 mg/dL (ref 0.57–1.00)
GFR calc Af Amer: 102 mL/min/{1.73_m2} (ref >59)
GFR calc non Af Amer: 88 mL/min/{1.73_m2} (ref >59)
GLUCOSE: 96 mg/dL (ref 65–99)
Globulin, Total: 3.1 g/dL (ref 1.5–4.5)
POTASSIUM: 3.4 mmol/L — AB (ref 3.5–5.2)
Sodium: 134 mmol/L (ref 134–144)
Total Protein: 6.9 g/dL (ref 6.0–8.5)

## 2015-11-29 LAB — CBC WITH DIFFERENTIAL (CANCER CENTER ONLY)
BASO#: 0 10*3/uL (ref 0.0–0.2)
BASO%: 0.3 % (ref 0.0–2.0)
EOS%: 2 % (ref 0.0–7.0)
Eosinophils Absolute: 0.2 10*3/uL (ref 0.0–0.5)
HCT: 38.7 % (ref 34.8–46.6)
HEMOGLOBIN: 12.4 g/dL (ref 11.6–15.9)
LYMPH#: 1.7 10*3/uL (ref 0.9–3.3)
LYMPH%: 17.1 % (ref 14.0–48.0)
MCH: 26.3 pg (ref 26.0–34.0)
MCHC: 32 g/dL (ref 32.0–36.0)
MCV: 82 fL (ref 81–101)
MONO#: 0.7 10*3/uL (ref 0.1–0.9)
MONO%: 6.6 % (ref 0.0–13.0)
NEUT#: 7.4 10*3/uL — ABNORMAL HIGH (ref 1.5–6.5)
NEUT%: 74 % (ref 39.6–80.0)
Platelets: 231 10*3/uL (ref 145–400)
RBC: 4.72 10*6/uL (ref 3.70–5.32)
RDW: 16.4 % — ABNORMAL HIGH (ref 11.1–15.7)
WBC: 10 10*3/uL (ref 3.9–10.0)

## 2015-11-29 NOTE — Progress Notes (Signed)
Hematology and Oncology Follow Up Visit  [name] [id number] 01-Nov-1955 60 y.o. 11/29/2015   Principle Diagnosis:  Iron deficiency anemia  Current Therapy:   IV iron as indicated - last received in November 2016    Interim History: [name] is here today for a follow-up. She is doing well but still has some intermittent fatigue. Her last dose of Feraheme was in November.  She continues to receive her B 12 injections at her PCP office monthly.  No fever, chills, n/v, cough, rash, dizziness, SOB, chest pain, abdominal pain or changes in bowel or bladder habits. She occasionally has palpitations due to atrial fib and takes cardizem daily.  She is still having issues with her left foot and wearing a brace. She has chronic swelling in her feet and ankles and takes lasix daily. The neuropathy in her hands and feet is unchanged.  She is eating well and staying hydrated. Her weight is unchanged.   Medications:    Medication List       This list is accurate as of: 11/29/15  3:24 PM.  Always use your most recent med list.               albuterol (2.5 MG/3ML) 0.083% nebulizer solution  Commonly known as:  PROVENTIL  INHALE 1 VIAL VIA NEBULIZER EVERY 4 HOURS AS NEEDED WHEEZING     aspirin EC 81 MG tablet  Take 1 tablet (81 mg total) by mouth daily.     calcitRIOL 0.5 MCG capsule  Commonly known as:  ROCALTROL  Take 1.5 mcg by mouth daily.     cyclobenzaprine 10 MG tablet  Commonly known as:  FLEXERIL  Take 10 mg by mouth at bedtime.     desoximetasone 0.05 % cream  Commonly known as:  TOPICORT  Apply topically 2 (two) times daily.     diltiazem 240 MG 24 hr capsule  Commonly known as:  CARDIZEM CD  Take 1 capsule (240 mg total) by mouth daily. NEED OV.     DIPHENHYDRAMINE-PSEUDOEPHED PO  Take 1 tablet by mouth every 6 (six) hours as needed (for allegies).     fluticasone 50 MCG/ACT nasal spray  Commonly known as:  FLONASE  Place 2 sprays into both nostrils daily  as needed for allergies or rhinitis.     furosemide 40 MG tablet  Commonly known as:  LASIX  Take 1 tablet by mouth  daily     hydrochlorothiazide 25 MG tablet  Commonly known as:  HYDRODIURIL  Take 1 tablet by mouth  daily     levocetirizine 5 MG tablet  Commonly known as:  XYZAL  Take 1 tablet (5 mg total) by mouth every evening.     levothyroxine 137 MCG tablet  Commonly known as:  SYNTHROID, LEVOTHROID  Take 411 mcg by mouth daily. Takes 3 tabs in AM     MANGANESE PO  Take 1,250 mg by mouth 2 (two) times daily.     methocarbamol 500 MG tablet  Commonly known as:  ROBAXIN  Take 500 mg by mouth 2 (two) times daily.     omeprazole 40 MG capsule  Commonly known as:  PRILOSEC  Take 1 capsule (40 mg total) by mouth daily.     promethazine-dextromethorphan 6.25-15 MG/5ML syrup  Commonly known as:  PROMETHAZINE-DM  Take 5 mLs by mouth 4 (four) times daily as needed.     SYMBICORT 160-4.5 MCG/ACT inhaler  Generic drug:  budesonide-formoterol  Inhale 2 puffs by mouth  two times daily     traMADol 50 MG tablet  Commonly known as:  ULTRAM  Take 1 tablet (50 mg total) by mouth every 6 (six) hours as needed for moderate pain.     valsartan 80 MG tablet  Commonly known as:  DIOVAN  Take 1 tablet by mouth  daily     VIRTRATE-K 1100-334 MG/5ML solution  Generic drug:  citric acid-potassium citrate  Take 15 mLs by mouth 2 (two) times daily.     vitamin B-12 1000 MCG tablet  Commonly known as:  CYANOCOBALAMIN  Take 1,000 mcg by mouth daily.     Vitamin D (Ergocalciferol) 50000 units Caps capsule  Commonly known as:  DRISDOL  Take 1 capsule (50,000 Units total) by mouth 3 (three) times a week.     zafirlukast 20 MG tablet  Commonly known as:  ACCOLATE  Take 20 mg by mouth 2 (two) times daily before a meal.        Allergies:  Allergies  Allergen Reactions  . Other Anaphylaxis    NUTS  . Amoxicillin-Pot Clavulanate Other (See Comments)    headache  . Food Hives     Potato  . Penicillins Other (See Comments)    headache  . Tomato Hives    Past Medical History, Surgical history, Social history, and Family History were reviewed and updated.  Review of Systems: All other 10 point review of systems is negative.   Physical Exam:  vitals were not taken for this visit.  Wt Readings from Last 3 Encounters:  10/30/15 312 lb (141.522 kg)  09/18/15 311 lb 6.4 oz (141.25 kg)  07/30/15 314 lb (142.429 kg)    Ocular: Sclerae unicteric, pupils equal, round and reactive to light Ear-nose-throat: Oropharynx clear, dentition fair Lymphatic: No cervical supraclavicular or axillary adenopathy Lungs no rales or rhonchi, good excursion bilaterally Heart regular rate and rhythm, no murmur appreciated Abd soft, nontender, positive bowel sounds, no liver or spleen tip palpated on exam, no fluid wave MSK no focal spinal tenderness, no joint edema Neuro: non-focal, well-oriented, appropriate affect Breasts: Deferred  Lab Results  Component Value Date   WBC 10.0 11/29/2015   HGB 12.4 11/29/2015   HCT 38.7 11/29/2015   MCV 82 11/29/2015   PLT 231 11/29/2015   Lab Results  Component Value Date   FERRITIN 1,526* 07/30/2015   IRON 25* 07/30/2015   TIBC 261 07/30/2015   UIBC 236 07/30/2015   IRONPCTSAT 10* 07/30/2015   Lab Results  Component Value Date   RETICCTPCT 3.1* 03/30/2015   RBC 4.72 11/29/2015   RETICCTABS 131.4 03/30/2015   No results found for: Nils Pyle [facility] Lab Results  Component Value Date   IGGSERUM 1250 06/09/2007   IGA 334 06/09/2007   IGMSERUM 244 06/09/2007   Lab Results  Component Value Date   TOTALPROTELP 7.0 06/09/2007     Chemistry      Component Value Date/Time   NA 139 07/30/2015 1302   NA 139 03/20/2015 1133   K 3.5 07/30/2015 1302   K 3.5 03/20/2015 1133   CL 99 03/20/2015 1133   CO2 25 07/30/2015 1302   CO2 30 03/20/2015 1133   BUN 16.8 07/30/2015 1302   BUN 15 03/20/2015 1133    CREATININE 0.8 07/30/2015 1302   CREATININE 0.76 03/20/2015 1133   CREATININE 0.80 11/23/2013 0859      Component Value Date/Time   CALCIUM 8.8 07/30/2015 1302   CALCIUM 9.1 03/20/2015 1133   CALCIUM 8.0*  05/27/2010 1855   ALKPHOS 99 07/30/2015 1302   ALKPHOS 100 11/15/2014 0900   AST 36* 07/30/2015 1302   AST 20 11/15/2014 0900   ALT 68* 07/30/2015 1302   ALT 36* 11/15/2014 0900   BILITOT 0.41 07/30/2015 1302   BILITOT 0.4 11/15/2014 0900     Impression and Plan: Ms. Vandenburgh is 60 yo white female with iron deficiency anemia secondary to gastric bypass and malabsorption. She is having intermittent fatigue but otherwise has no complaints at this time.  We will see what her iron studies show and bring her back in next week for an infusion if needed.  We will plan to see her back in 4 months for labs and follow-up. She will contact us with any questions or concerns. We can certainly see her sooner if need be.   Eliezer Bottom, NP 7/6/20173:24 PM

## 2015-11-30 ENCOUNTER — Telehealth: Payer: Self-pay | Admitting: Nurse Practitioner

## 2015-11-30 LAB — RETICULOCYTES: RETICULOCYTE COUNT: 1.9 % (ref 0.6–2.6)

## 2015-11-30 LAB — IRON AND TIBC
%SAT: 11 % — AB (ref 21–57)
IRON: 31 ug/dL — AB (ref 41–142)
TIBC: 274 ug/dL (ref 236–444)
UIBC: 243 ug/dL (ref 120–384)

## 2015-11-30 LAB — FERRITIN: Ferritin: 1069 ng/ml — ABNORMAL HIGH (ref 9–269)

## 2015-11-30 NOTE — Telephone Encounter (Addendum)
Pt verbalized understanding and have been scheduled----- Message from Eliezer Bottom, NP sent at 11/30/2015 11:34 AM EDT ----- Regarding: Iron Iron low. She will need 2 doses of feraheme scheduled please. Thank you!  Sarah  ----- Message -----    From: [facility]    Sent: 11/29/2015   3:09 PM      To: Eliezer Bottom, NP

## 2015-12-04 ENCOUNTER — Other Ambulatory Visit: Payer: Self-pay | Admitting: Family

## 2015-12-06 ENCOUNTER — Ambulatory Visit (HOSPITAL_BASED_OUTPATIENT_CLINIC_OR_DEPARTMENT_OTHER): Payer: 59

## 2015-12-06 VITALS — BP 118/54 | HR 76 | Temp 98.1°F | Resp 18

## 2015-12-06 DIAGNOSIS — K909 Intestinal malabsorption, unspecified: Secondary | ICD-10-CM

## 2015-12-06 DIAGNOSIS — D509 Iron deficiency anemia, unspecified: Secondary | ICD-10-CM

## 2015-12-06 DIAGNOSIS — D508 Other iron deficiency anemias: Secondary | ICD-10-CM

## 2015-12-06 MED ORDER — SODIUM CHLORIDE 0.9 % IV SOLN
Freq: Once | INTRAVENOUS | Status: AC
  Administered 2015-12-06: 08:00:00 via INTRAVENOUS

## 2015-12-06 MED ORDER — SODIUM CHLORIDE 0.9 % IV SOLN
510.0000 mg | Freq: Once | INTRAVENOUS | Status: AC
  Administered 2015-12-06: 510 mg via INTRAVENOUS
  Filled 2015-12-06: qty 17

## 2015-12-06 NOTE — Patient Instructions (Signed)

## 2015-12-09 ENCOUNTER — Other Ambulatory Visit: Payer: Self-pay | Admitting: Family

## 2015-12-10 ENCOUNTER — Other Ambulatory Visit: Payer: Self-pay | Admitting: Family

## 2015-12-10 NOTE — Telephone Encounter (Signed)
OK to send refill #30.

## 2015-12-10 NOTE — Telephone Encounter (Signed)
Requesting Vitamin D 50,000 units CAPS-Take 1 capsule by mouth 3 times weekly. Last refill:09/19/15;#36,1 Last OV:09/18/15 Please advise.//AB/CMA

## 2015-12-11 NOTE — Telephone Encounter (Signed)
Rx called to Sarah at [facility].

## 2015-12-13 ENCOUNTER — Ambulatory Visit (HOSPITAL_BASED_OUTPATIENT_CLINIC_OR_DEPARTMENT_OTHER): Payer: Commercial Managed Care - HMO

## 2015-12-13 VITALS — BP 136/55 | HR 81 | Temp 98.1°F | Resp 18

## 2015-12-13 DIAGNOSIS — D509 Iron deficiency anemia, unspecified: Secondary | ICD-10-CM | POA: Diagnosis not present

## 2015-12-13 MED ORDER — FERUMOXYTOL INJECTION 510 MG/17 ML
510.0000 mg | Freq: Once | INTRAVENOUS | Status: AC
  Administered 2015-12-13: 510 mg via INTRAVENOUS
  Filled 2015-12-13: qty 17

## 2015-12-13 MED ORDER — SODIUM CHLORIDE 0.9 % IV SOLN
Freq: Once | INTRAVENOUS | Status: AC
  Administered 2015-12-13: 15:00:00 via INTRAVENOUS

## 2015-12-13 NOTE — Patient Instructions (Signed)

## 2015-12-18 ENCOUNTER — Ambulatory Visit (INDEPENDENT_AMBULATORY_CARE_PROVIDER_SITE_OTHER): Payer: Commercial Managed Care - HMO | Admitting: *Deleted

## 2015-12-18 DIAGNOSIS — E538 Deficiency of other specified B group vitamins: Secondary | ICD-10-CM | POA: Diagnosis not present

## 2015-12-18 MED ORDER — CYANOCOBALAMIN 1000 MCG/ML IJ SOLN
1000.0000 ug | Freq: Once | INTRAMUSCULAR | Status: AC
  Administered 2015-12-18: 1000 ug via INTRAMUSCULAR

## 2015-12-18 NOTE — Progress Notes (Signed)
Pre visit review using our clinic review tool, if applicable. No additional management support is needed unless otherwise documented below in the visit note.  Patient tolerated injection well.  Next appointment: 01/18/16  Dorrene German, RN

## 2016-01-18 ENCOUNTER — Ambulatory Visit (INDEPENDENT_AMBULATORY_CARE_PROVIDER_SITE_OTHER): Payer: Commercial Managed Care - HMO | Admitting: Behavioral Health

## 2016-01-18 DIAGNOSIS — E538 Deficiency of other specified B group vitamins: Secondary | ICD-10-CM | POA: Diagnosis not present

## 2016-01-18 MED ORDER — CYANOCOBALAMIN 1000 MCG/ML IJ SOLN
1000.0000 ug | Freq: Once | INTRAMUSCULAR | Status: AC
  Administered 2016-01-18: 1000 ug via INTRAMUSCULAR

## 2016-01-18 NOTE — Progress Notes (Signed)
Pre visit review using our clinic review tool, if applicable. No additional management support is needed unless otherwise documented below in the visit note.  Patient in clinic today for B12 injection. IM given in Left Deltoid. Patient tolerated injection well.   Next appointment scheduled for 02/19/16 at 9:30 AM.

## 2016-02-19 ENCOUNTER — Ambulatory Visit (INDEPENDENT_AMBULATORY_CARE_PROVIDER_SITE_OTHER): Payer: Commercial Managed Care - HMO

## 2016-02-19 DIAGNOSIS — Z23 Encounter for immunization: Secondary | ICD-10-CM

## 2016-02-19 DIAGNOSIS — E538 Deficiency of other specified B group vitamins: Secondary | ICD-10-CM | POA: Diagnosis not present

## 2016-02-19 MED ORDER — CYANOCOBALAMIN 1000 MCG/ML IJ SOLN
1000.0000 ug | Freq: Once | INTRAMUSCULAR | Status: AC
  Administered 2016-02-19: 1000 ug via INTRAMUSCULAR

## 2016-02-19 NOTE — Progress Notes (Signed)
Noted  

## 2016-02-19 NOTE — Progress Notes (Signed)
Pre visit review using our clinic review tool, if applicable. No additional management support is needed unless otherwise documented below in the visit note.  Pt in clinic today for B12 injection and flu vaccine.  Both injection given. Pt tolerated both well.  No signs of reaction noted.    Pt states she has an appt with provider in 4 weeks and would like to received B12 injection at that visit.  B12 added to appt note.

## 2016-03-19 ENCOUNTER — Encounter: Payer: Self-pay | Admitting: Family

## 2016-03-19 ENCOUNTER — Telehealth: Payer: Self-pay | Admitting: Family

## 2016-03-19 ENCOUNTER — Ambulatory Visit (HOSPITAL_BASED_OUTPATIENT_CLINIC_OR_DEPARTMENT_OTHER)
Admission: RE | Admit: 2016-03-19 | Discharge: 2016-03-19 | Disposition: A | Discharge status: Home / Self Care | Payer: Commercial Managed Care - HMO | Source: Ambulatory Visit | Attending: Family | Admitting: Family

## 2016-03-19 ENCOUNTER — Ambulatory Visit (INDEPENDENT_AMBULATORY_CARE_PROVIDER_SITE_OTHER): Payer: Commercial Managed Care - HMO | Admitting: Family

## 2016-03-19 VITALS — BP 129/81 | HR 83 | Temp 98.9°F | Resp 18 | Ht 65.0 in | Wt 305.0 lb

## 2016-03-19 DIAGNOSIS — E2839 Other primary ovarian failure: Secondary | ICD-10-CM | POA: Insufficient documentation

## 2016-03-19 DIAGNOSIS — Z23 Encounter for immunization: Secondary | ICD-10-CM | POA: Diagnosis not present

## 2016-03-19 DIAGNOSIS — E538 Deficiency of other specified B group vitamins: Secondary | ICD-10-CM | POA: Diagnosis not present

## 2016-03-19 DIAGNOSIS — Z0000 Encounter for general adult medical examination without abnormal findings: Secondary | ICD-10-CM | POA: Diagnosis not present

## 2016-03-19 DIAGNOSIS — E559 Vitamin D deficiency, unspecified: Secondary | ICD-10-CM | POA: Diagnosis not present

## 2016-03-19 LAB — BASIC METABOLIC PANEL
BUN: 21 mg/dL (ref 6–23)
CALCIUM: 9 mg/dL (ref 8.4–10.5)
CHLORIDE: 100 meq/L (ref 96–112)
CO2: 29 meq/L (ref 19–32)
CREATININE: 0.83 mg/dL (ref 0.40–1.20)
GFR: 74.82 mL/min (ref >60.00)
Glucose, Bld: 87 mg/dL (ref 70–99)
Potassium: 3.7 mEq/L (ref 3.5–5.1)
Sodium: 138 mEq/L (ref 135–145)

## 2016-03-19 LAB — URINALYSIS, ROUTINE W REFLEX MICROSCOPIC
BILIRUBIN URINE: NEGATIVE
Hgb urine dipstick: NEGATIVE
KETONES UR: NEGATIVE
NITRITE: NEGATIVE
Specific Gravity, Urine: <=1.005 — AB (ref 1.000–1.030)
Total Protein, Urine: NEGATIVE
Urine Glucose: NEGATIVE
Urobilinogen, UA: 0.2 (ref 0.0–1.0)
pH: 7 (ref 5.0–8.0)

## 2016-03-19 LAB — LIPID PANEL
CHOL/HDL RATIO: 3
Cholesterol: 146 mg/dL (ref 0–200)
HDL: 49.3 mg/dL (ref >39.00)
LDL CALC: 77 mg/dL (ref 0–99)
NONHDL: 96.9
TRIGLYCERIDES: 98 mg/dL (ref 0.0–149.0)
VLDL: 19.6 mg/dL (ref 0.0–40.0)

## 2016-03-19 LAB — CBC WITH DIFFERENTIAL/PLATELET
BASOS ABS: 0 10*3/uL (ref 0.0–0.1)
BASOS PCT: 0.3 % (ref 0.0–3.0)
EOS ABS: 0.2 10*3/uL (ref 0.0–0.7)
Eosinophils Relative: 1.4 % (ref 0.0–5.0)
HEMATOCRIT: 43.1 % (ref 36.0–46.0)
Hemoglobin: 14.2 g/dL (ref 12.0–15.0)
LYMPHS PCT: 13.8 % (ref 12.0–46.0)
Lymphs Abs: 1.8 10*3/uL (ref 0.7–4.0)
MCHC: 33 g/dL (ref 30.0–36.0)
MCV: 81.6 fl (ref 78.0–100.0)
MONO ABS: 0.5 10*3/uL (ref 0.1–1.0)
Monocytes Relative: 4.1 % (ref 3.0–12.0)
NEUTROS ABS: 10.2 10*3/uL — AB (ref 1.4–7.7)
NEUTROS PCT: 80.4 % — AB (ref 43.0–77.0)
PLATELETS: 147 10*3/uL — AB (ref 150.0–400.0)
RBC: 5.29 Mil/uL — ABNORMAL HIGH (ref 3.87–5.11)
RDW: 15.9 % — AB (ref 11.5–15.5)
WBC: 12.7 10*3/uL — ABNORMAL HIGH (ref 4.0–10.5)

## 2016-03-19 LAB — HEPATIC FUNCTION PANEL
ALK PHOS: 106 U/L (ref 39–117)
ALT: 31 U/L (ref 0–35)
AST: 20 U/L (ref 0–37)
Albumin: 4.1 g/dL (ref 3.5–5.2)
BILIRUBIN DIRECT: 0.2 mg/dL (ref 0.0–0.3)
BILIRUBIN TOTAL: 0.5 mg/dL (ref 0.2–1.2)
TOTAL PROTEIN: 7.7 g/dL (ref 6.0–8.3)

## 2016-03-19 LAB — VITAMIN B12

## 2016-03-19 LAB — VITAMIN D 25 HYDROXY (VIT D DEFICIENCY, FRACTURES): VITD: 29.82 ng/mL — AB (ref 30.00–100.00)

## 2016-03-19 MED ORDER — TRAMADOL HCL 50 MG PO TABS: ORAL_TABLET | ORAL | 0 refills | Status: DC

## 2016-03-19 MED ORDER — CYANOCOBALAMIN 1000 MCG/ML IJ SOLN
1000.0000 ug | Freq: Once | INTRAMUSCULAR | Status: AC
  Administered 2016-03-19: 1000 ug via INTRAMUSCULAR

## 2016-03-19 MED ORDER — ZAFIRLUKAST 20 MG PO TABS: 20.0000 mg | ORAL_TABLET | Freq: Two times a day (BID) | ORAL | 1 refills | Status: DC

## 2016-03-19 NOTE — Progress Notes (Signed)
Pre visit review using our clinic review tool, if applicable. No additional management support is needed unless otherwise documented below in the visit note. 

## 2016-03-19 NOTE — Patient Instructions (Signed)
Please complete lab work prior to leaving.   

## 2016-03-19 NOTE — Telephone Encounter (Signed)
Dr. Henrene Pastor, I was reviewing [name]'s chart today and she noted that she never completed the virtual colonoscopy that you had recommended for her back in 2015.  I wanted to bring this to your attention. She tells me she is willing to proceed if this is covered by her insurance.  Thanks.

## 2016-03-19 NOTE — Progress Notes (Signed)
Subjective:    Patient ID: [name], female    DOB: [birth year], 60 y.o.   MRN: [id number]  HPI  Patient presents today for complete physical.  Immunizations: flu shot up to date Diet: reports diet is improved Exercise: limited exercise Colonoscopy:  9/15 Dexa: due Pap Smear:  Up to date Mammogram: declines mammogram  Wt Readings from Last 3 Encounters:  03/19/16 (!) 305 lb (138.3 kg)  11/29/15 (!) 312 lb (141.5 kg)  10/30/15 (!) 312 lb (141.5 kg)    Review of Systems  Constitutional: Negative for unexpected weight change.  HENT: Positive for rhinorrhea. Negative for hearing loss.   Eyes:       Reports some vision changed due recent thyroid med adjustment. Up to date on vision screen  Respiratory: Negative for cough.   Cardiovascular:       + LE edema at baseline  Gastrointestinal: Negative for constipation and diarrhea.  Genitourinary: Positive for frequency. Negative for dysuria.       Attributes frequency to diuretics  Musculoskeletal:       Mild joint pain  Skin:       Some dry skin  Neurological: Negative for headaches.  Hematological: Negative for adenopathy.  Psychiatric/Behavioral:       Denies depression/anxiety   Past Medical History:  Diagnosis Date  . Abnormal Pap smear    years ago/no biopsy  . Allergic rhinitis   . Anemia, iron deficiency   . Arthritis    back- lower  . Asthma   . Atrial fibrillation ([place]) August 2012   OFF XARELTO LAST MONTH DUE TO BLEEDING IN STOOL  . Bursitis of hip left  . Dysrhythmia    afib, followed by Dr. Stanford Breed   . Edema of both legs   . Fatty liver 01/07/2012  . Fibroid 1974   fibroid cyst on left fallopian tube  . Fibromyalgia   . Foot ulcer ([place])    AREA HEALED RIGHT FOOT  . GERD (gastroesophageal reflux disease)   . Headache(784.0)    occasional sinus headache   . History of blood transfusion JULY 2015  . Hypertension   . Hypoparathyroidism ([place]) 01/07/2011  . Hypothyroidism   . Kidney stone  Oct 09, 2015   passed on their own  . Morbid obesity ([place])   . Nephrolithiasis 2/16, 9/16   SEES DR Risa Grill  . Pernicious anemia 02/20/2014   followed by Debbrah Alar  . PONV (postoperative nausea and vomiting)   . Seizures ([place])    infancy secondary to fever  . Thyroid cancer ([place]) 2011   THYROIDECTOMY DONE  . Uterine cancer ([place]) 2014   Mirena IUD  . Vitamin D deficiency      Social History   Social History  . Marital status: Single    Spouse name: N/A  . Number of children: 0  . Years of education: N/A   Occupational History  . works in Insurance claims handler   . 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 Smoking status: Former Smoker    Packs/day: 0.50    Years: 25.00    Types: Cigarettes    Start date: 12/24/1970    Quit date: 05/27/1995  . Smokeless tobacco: Never Used     Comment: quit smoking 19 years ago  . Alcohol use 0.0 oz/week     Comment: 1/2 glass per month  . Drug use: No  . Sexual activity: Yes    Partners: Male    Birth  control/ protection: Post-menopausal   Other Topics Concern  . Not on file   Social History Narrative   Occupation: works in Insurance claims handler - Field seismologist   Single       Former Smoker - quit tobacco 12 years ago.  She was light smoker for 10 years.                 Past Surgical History:  Procedure Laterality Date  . AMPUTATION Right 05/18/2014   Procedure: RIGHT FIFTH RAY AMPUTATION FOOT;  Surgeon: Wylene Simmer, MD;  Location: [facility];  Service: Orthopedics;  Laterality: Right;  . APPENDECTOMY    . BUNIONECTOMY     bilateral  . COLONOSCOPY WITH PROPOFOL N/A 02/02/2014   Procedure: COLONOSCOPY WITH PROPOFOL;  Surgeon: Irene Shipper, MD;  Location: [facility];  Service: Endoscopy;  Laterality: N/A;  . cyst on ovary removed     . CYSTOSCOPY W/ RETROGRADES  03/03/2012   Procedure: CYSTOSCOPY WITH RETROGRADE PYELOGRAM;  Surgeon: Bernestine Amass, MD;  Location: [facility];  Service: Urology;  Laterality:  Bilateral;  . CYSTOSCOPY WITH RETROGRADE PYELOGRAM, URETEROSCOPY AND STENT PLACEMENT Left 08/25/2012   Procedure: CYSTOSCOPY WITH RETROGRADE PYELOGRAM, URETEROSCOPY;  Surgeon: Bernestine Amass, MD;  Location: [facility];  Service: Urology;  Laterality: Left;  . DILATION AND CURETTAGE OF UTERUS N/A 02/21/2013   Procedure: DILATATION AND CURETTAGE;  Surgeon: Lyman Speller, MD;  Location: [facility];  Service: Gynecology;  Laterality: N/A;  . DILATION AND CURETTAGE OF UTERUS N/A 04/09/2015   Procedure: [place] IUD removal;  Surgeon: Megan Salon, MD;  Location: [facility];  Service: Gynecology;  Laterality: N/A;  Patient weight 307lbs  . ESOPHAGOGASTRODUODENOSCOPY (EGD) WITH PROPOFOL N/A 02/02/2014   Procedure: ESOPHAGOGASTRODUODENOSCOPY (EGD) WITH PROPOFOL;  Surgeon: Irene Shipper, MD;  Location: [facility];  Service: Endoscopy;  Laterality: N/A;  . GASTRIC BYPASS  1974  . HOLMIUM LASER APPLICATION Left 0000000   Procedure: HOLMIUM LASER APPLICATION;  Surgeon: Bernestine Amass, MD;  Location: [facility];  Service: Urology;  Laterality: Left;  . LITHOTRIPSY  03/2012  . LITHOTRIPSY  2/16  . THYROIDECTOMY  05/15/2010  . TONSILLECTOMY AND ADENOIDECTOMY    . URETEROSCOPY  03/03/2012   Procedure: URETEROSCOPY;  Surgeon: Bernestine Amass, MD;  Location: [facility];  Service: Urology;  Laterality: Left;    Family History  Problem Relation Age of Onset  . Hypertension Father   . Diabetes Father   . Lung cancer Father   . Heart attack Father     MI at age 59  . Hypertension Mother   . Hyperthyroidism Mother   . Heart disease Mother   . Heart attack Mother     MI at age 78  . Asthma Brother   . Hypertension Brother     younger  . Heart disease Brother     older  . Colon cancer Neg Hx   . Esophageal cancer Neg Hx   . Stomach cancer Neg Hx   . Kidney disease Neg Hx   . Liver disease Neg Hx     Allergies  Allergen Reactions  . Other Anaphylaxis    NUTS  . Amoxicillin-Pot Clavulanate  Other (See Comments)    headache  . Food Hives    Potato  . Penicillins Other (See Comments)    headache  . Tomato Hives    Current Outpatient Prescriptions on File Prior to Visit  Medication Sig Dispense Refill  . albuterol (  PROVENTIL) (2.5 MG/3ML) 0.083% nebulizer solution INHALE 1 VIAL VIA NEBULIZER EVERY 4 HOURS AS NEEDED WHEEZING 75 mL 0  . aspirin EC 81 MG tablet Take 1 tablet (81 mg total) by mouth daily. 90 tablet 3  . calcitRIOL (ROCALTROL) 0.5 MCG capsule Take 1.5 mcg by mouth daily.     . cyclobenzaprine (FLEXERIL) 10 MG tablet Take 10 mg by mouth at bedtime.    [name] desoximetasone (TOPICORT) 0.05 % cream Apply topically 2 (two) times daily. 30 g 0  . diltiazem (CARDIZEM CD) 240 MG 24 hr capsule Take 1 capsule (240 mg total) by mouth daily. NEED OV. 90 capsule 0  . DIPHENHYDRAMINE-PSEUDOEPHED PO Take 1 tablet by mouth every 6 (six) hours as needed (for allegies).    . fluticasone (FLONASE) 50 MCG/ACT nasal spray Place 2 sprays into both nostrils daily as needed for allergies or rhinitis.     . furosemide (LASIX) 40 MG tablet Take 1 tablet by mouth  daily 90 tablet 1  . hydrochlorothiazide (HYDRODIURIL) 25 MG tablet Take 1 tablet by mouth  daily 90 tablet 1  . levocetirizine (XYZAL) 5 MG tablet Take 1 tablet by mouth  every evening 90 tablet 1  . levothyroxine (SYNTHROID, LEVOTHROID) 137 MCG tablet Take 411 mcg by mouth daily. Takes 3 tabs in AM    . MANGANESE PO Take 1,250 mg by mouth 2 (two) times daily.    . methocarbamol (ROBAXIN) 500 MG tablet Take 500 mg by mouth 2 (two) times daily.    [name] omeprazole (PRILOSEC) 40 MG capsule Take 1 capsule (40 mg total) by mouth daily. 30 capsule 6  . SYMBICORT 160-4.5 MCG/ACT inhaler Inhale 2 puffs by mouth two times daily 30.6 g 1  . traMADol (ULTRAM) 50 MG tablet TAKE 1 TABLET BY MOUTH EVERY 6 HOURS AS NEEDED FOR MODERATE PAIN 30 tablet 0  . valsartan (DIOVAN) 80 MG tablet Take 1 tablet by mouth  daily 90 tablet 1  . VIRTRATE-K 1100-334  MG/5ML solution Take 15 mLs by mouth 2 (two) times daily.   0  . vitamin B-12 (CYANOCOBALAMIN) 1000 MCG tablet Take 1,000 mcg by mouth daily.    . Vitamin D, Ergocalciferol, (DRISDOL) 50000 units CAPS capsule Take 1 capsule by mouth 3  times weekly 36 capsule 1   No current facility-administered medications on file prior to visit.     BP 129/81 (BP Location: Right Arm, Patient Position: Sitting, Cuff Size: Large)   Pulse 83   Temp 98.9 F (37.2 C) (Oral)   Resp 18   Ht 5\' 5"  (1.651 m)   Wt (!) 305 lb (138.3 kg)   LMP 05/26/2009   SpO2 97% Comment: room air  BMI 50.75 kg/m       Objective:   Physical Exam  Physical Exam  Constitutional: She is oriented to person, place, and time. She appears well-developed and well-nourished. No distress.  HENT:  Head: Normocephalic and atraumatic.  Right Ear: Tympanic membrane and ear canal normal.  Left Ear: Tympanic membrane and ear canal normal.  Mouth/Throat: Oropharynx is clear and moist.  Eyes: Pupils are equal, round, and reactive to light. No scleral icterus.  Neck: Normal range of motion. No thyromegaly present.  Cardiovascular: Normal rate and regular rhythm.   No murmur heard. Pulmonary/Chest: Effort normal and breath sounds normal. No respiratory distress. He has no wheezes. She has no rales. She exhibits no tenderness.  Abdominal: Soft. Bowel sounds are normal. She exhibits no distension and no mass. There  is no tenderness. There is no rebound and no guarding.  Musculoskeletal: She exhibits no edema.  Lymphadenopathy:    She has no cervical adenopathy.  Neurological: She is alert and oriented to person, place, and time. She has normal patellar reflexes. She exhibits normal muscle tone. Coordination normal.  Skin: Skin is warm and dry.  Psychiatric: She has a normal mood and affect. Her behavior is normal. Judgment and thought content normal.  Breast/pelvic: deferred to GYN        Assessment & Plan:  Preventative Care:   Discussed healthy diet, exercise, weight loss. Obtain routine labs (except tsh which is being monitored by endocrinology). Also, will check follow up b12 and vitamin D levels.  Prevnar today due to hx of asthma.  Refer for dexa, declines mammo.        Assessment & Plan:

## 2016-03-20 NOTE — Telephone Encounter (Signed)
As significant time has passed and she is very complicated, have your patient schedule an office evaluation with me to readdress the issues. Thanks

## 2016-03-21 ENCOUNTER — Other Ambulatory Visit: Payer: Self-pay | Admitting: Family

## 2016-03-21 MED ORDER — VITAMIN D (ERGOCALCIFEROL) 1.25 MG (50000 UNIT) PO CAPS
ORAL_CAPSULE | ORAL | 1 refills | Status: DC
Start: 2016-03-21 — End: 2016-03-25

## 2016-03-21 NOTE — Telephone Encounter (Signed)
Please contact patient and advise her as below. Thanks.

## 2016-03-21 NOTE — Progress Notes (Deleted)
Vit D still low. Increase vit D supplement 50000 units from 3x a week to 4x a week. Other labs look OK.

## 2016-03-22 NOTE — Progress Notes (Signed)
Vit D still low. Increase vit D supplement 50000 units from 3x a week to 4x a week. Other labs look OK.

## 2016-03-24 NOTE — Progress Notes (Signed)
Melissa--rx that was sent in on Friday said to take 5 x a week. Below note says 4 x a week. Please advised correct direction?

## 2016-03-24 NOTE — Progress Notes (Signed)
Should read 4 times a week please.

## 2016-03-24 NOTE — Telephone Encounter (Signed)
Also-- see 03/21/16 orders only instructions. Attempted to reach pt but line was busy. Will try again tomorrow.

## 2016-03-25 MED ORDER — VITAMIN D (ERGOCALCIFEROL) 1.25 MG (50000 UNIT) PO CAPS: ORAL_CAPSULE | ORAL | 1 refills | Status: DC

## 2016-03-25 NOTE — Telephone Encounter (Signed)
Notified pt. Pt declines to proceed with GI appointment. States she is "done".

## 2016-03-25 NOTE — Progress Notes (Signed)
Notified pt and she voices understanding. Rx sent to pharmacy.  

## 2016-04-02 ENCOUNTER — Other Ambulatory Visit (HOSPITAL_BASED_OUTPATIENT_CLINIC_OR_DEPARTMENT_OTHER): Payer: Commercial Managed Care - HMO

## 2016-04-02 ENCOUNTER — Other Ambulatory Visit: Payer: Self-pay

## 2016-04-02 ENCOUNTER — Ambulatory Visit (HOSPITAL_BASED_OUTPATIENT_CLINIC_OR_DEPARTMENT_OTHER): Payer: Commercial Managed Care - HMO | Admitting: Family

## 2016-04-02 VITALS — BP 118/63 | HR 96 | Temp 98.2°F | Resp 20 | Wt 305.8 lb

## 2016-04-02 DIAGNOSIS — D509 Iron deficiency anemia, unspecified: Secondary | ICD-10-CM

## 2016-04-02 DIAGNOSIS — D508 Other iron deficiency anemias: Secondary | ICD-10-CM

## 2016-04-02 DIAGNOSIS — D510 Vitamin B12 deficiency anemia due to intrinsic factor deficiency: Secondary | ICD-10-CM | POA: Diagnosis not present

## 2016-04-02 LAB — COMPREHENSIVE METABOLIC PANEL (CC13)
A/G RATIO: 1.1 — AB (ref 1.2–2.2)
ALK PHOS: 129 IU/L — AB (ref 39–117)
ALT: 27 IU/L (ref 0–32)
AST: 20 IU/L (ref 0–40)
Albumin, Serum: 3.9 g/dL (ref 3.5–5.5)
BILIRUBIN TOTAL: 0.4 mg/dL (ref 0.0–1.2)
BUN/Creatinine Ratio: 23 (ref 9–23)
BUN: 19 mg/dL (ref 6–24)
CHLORIDE: 98 mmol/L (ref 96–106)
Calcium, Ser: 8.4 mg/dL — ABNORMAL LOW (ref 8.7–10.2)
Carbon Dioxide, Total: 29 mmol/L (ref 18–29)
Creatinine, Ser: 0.81 mg/dL (ref 0.57–1.00)
GFR calc non Af Amer: 80 mL/min/{1.73_m2} (ref >59)
GFR, EST AFRICAN AMERICAN: 93 mL/min/{1.73_m2} (ref >59)
GLUCOSE: 99 mg/dL (ref 65–99)
Globulin, Total: 3.4 g/dL (ref 1.5–4.5)
POTASSIUM: 3.3 mmol/L — AB (ref 3.5–5.2)
Sodium: 135 mmol/L (ref 134–144)
Total Protein: 7.3 g/dL (ref 6.0–8.5)

## 2016-04-02 LAB — CBC WITH DIFFERENTIAL (CANCER CENTER ONLY)
BASO#: 0 10*3/uL (ref 0.0–0.2)
BASO%: 0.3 % (ref 0.0–2.0)
EOS%: 1.4 % (ref 0.0–7.0)
Eosinophils Absolute: 0.2 10*3/uL (ref 0.0–0.5)
HCT: 41 % (ref 34.8–46.6)
HGB: 13.5 g/dL (ref 11.6–15.9)
LYMPH#: 1.6 10*3/uL (ref 0.9–3.3)
LYMPH%: 13.9 % — AB (ref 14.0–48.0)
MCH: 27.4 pg (ref 26.0–34.0)
MCHC: 32.9 g/dL (ref 32.0–36.0)
MCV: 83 fL (ref 81–101)
MONO#: 0.5 10*3/uL (ref 0.1–0.9)
MONO%: 4.4 % (ref 0.0–13.0)
NEUT#: 9.3 10*3/uL — ABNORMAL HIGH (ref 1.5–6.5)
NEUT%: 80 % (ref 39.6–80.0)
PLATELETS: 224 10*3/uL (ref 145–400)
RBC: 4.93 10*6/uL (ref 3.70–5.32)
RDW: 14.9 % (ref 11.1–15.7)
WBC: 11.6 10*3/uL — AB (ref 3.9–10.0)

## 2016-04-02 NOTE — Telephone Encounter (Signed)
Attempted to call Pt on 04/02/2016 at approximately 5pm.LVM asking Pt to call the Office to clarify if she wanted this medication sent through Mail Order ([facility]) or the local Pharmacy.

## 2016-04-02 NOTE — Progress Notes (Signed)
Hematology and Oncology Follow Up Visit  [name] [id number] 1956-01-27 60 y.o. 04/02/2016   Principle Diagnosis:  Iron deficiency anemia  Current Therapy:   IV iron as indicated - last received in July 2017 x2    Interim History: [name] is here today for a follow-up. She is doing well and seems to have had a nice response to the 2 doses of Feraheme she received in July.  She is enjoying her new town home and is making all her curtains herself. She did get tangled up in the fabric 2 weeks ago and fell. Thankfully, she was not injured.   She continues to receive her B 12 injections at her PCP office monthly.  No fever, chills, n/v, cough, rash, dizziness, SOB, chest pain, abdominal pain or changes in bowel or bladder habits. She occasionally has palpitations due to atrial fib and takes cardizem and a baby aspirin daily.  She is still wearing a brace on her left foot and is not ready to have surgery yet. She has chronic swelling in her feet and ankles and takes lasix daily. The neuropathy in her hands and feet is unchanged.  She has maintained a good appetite and is staying well hydrated. Her weight is unchanged.   Medications:    Medication List       Accurate as of 04/02/16  3:30 PM. Always use your most recent med list.          albuterol (2.5 MG/3ML) 0.083% nebulizer solution Commonly known as:  PROVENTIL INHALE 1 VIAL VIA NEBULIZER EVERY 4 HOURS AS NEEDED WHEEZING   aspirin EC 81 MG tablet Take 1 tablet (81 mg total) by mouth daily.   calcitRIOL 0.5 MCG capsule Commonly known as:  ROCALTROL Take 1.5 mcg by mouth daily.   cyclobenzaprine 10 MG tablet Commonly known as:  FLEXERIL Take 10 mg by mouth at bedtime.   desoximetasone 0.05 % cream Commonly known as:  TOPICORT Apply topically 2 (two) times daily.   diltiazem 240 MG 24 hr capsule Commonly known as:  CARDIZEM CD Take 1 capsule (240 mg total) by mouth daily. NEED OV.   DIPHENHYDRAMINE-PSEUDOEPHED  PO Take 1 tablet by mouth every 6 (six) hours as needed (for allegies).   fluticasone 50 MCG/ACT nasal spray Commonly known as:  FLONASE Place 2 sprays into both nostrils daily as needed for allergies or rhinitis.   furosemide 40 MG tablet Commonly known as:  LASIX Take 1 tablet by mouth  daily   hydrochlorothiazide 25 MG tablet Commonly known as:  HYDRODIURIL Take 1 tablet by mouth  daily   levocetirizine 5 MG tablet Commonly known as:  XYZAL Take 1 tablet by mouth  every evening   levothyroxine 137 MCG tablet Commonly known as:  SYNTHROID, LEVOTHROID Take 411 mcg by mouth daily. Takes 3 tabs in AM   MANGANESE PO Take 1,250 mg by mouth 2 (two) times daily.   methocarbamol 500 MG tablet Commonly known as:  ROBAXIN Take 500 mg by mouth 2 (two) times daily.   omeprazole 40 MG capsule Commonly known as:  PRILOSEC Take 1 capsule (40 mg total) by mouth daily.   SYMBICORT 160-4.5 MCG/ACT inhaler Generic drug:  budesonide-formoterol Inhale 2 puffs by mouth two times daily   traMADol 50 MG tablet Commonly known as:  ULTRAM TAKE 1 TABLET BY MOUTH EVERY 6 HOURS AS NEEDED FOR MODERATE PAIN   valsartan 80 MG tablet Commonly known as:  DIOVAN Take 1 tablet by mouth  daily   VIRTRATE-K 1100-334 MG/5ML solution Generic drug:  citric acid-potassium citrate Take 15 mLs by mouth 2 (two) times daily.   vitamin B-12 1000 MCG tablet Commonly known as:  CYANOCOBALAMIN Take 1,000 mcg by mouth daily.   Vitamin D (Ergocalciferol) 50000 units Caps capsule Commonly known as:  DRISDOL Take 1 capsule by mouth 4  times weekly   zafirlukast 20 MG tablet Commonly known as:  ACCOLATE Take 1 tablet (20 mg total) by mouth 2 (two) times daily before a meal.       Allergies:  Allergies  Allergen Reactions  . Other Anaphylaxis    NUTS  . Amoxicillin-Pot Clavulanate Other (See Comments)    headache  . Food Hives    Potato  . Penicillins Other (See Comments)    headache  . Tomato  Hives    Past Medical History, Surgical history, Social history, and Family History were reviewed and updated.  Review of Systems: All other 10 point review of systems is negative.   Physical Exam:  weight is 305 lb 12.8 oz (138.7 kg) (abnormal). Her oral temperature is 98.2 F (36.8 C). Her blood pressure is 118/63 and her pulse is 96. Her respiration is 20.   Wt Readings from Last 3 Encounters:  04/02/16 (!) 305 lb 12.8 oz (138.7 kg)  03/19/16 (!) 305 lb (138.3 kg)  11/29/15 (!) 312 lb (141.5 kg)    Ocular: Sclerae unicteric, pupils equal, round and reactive to light Ear-nose-throat: Oropharynx clear, dentition fair Lymphatic: No cervical supraclavicular or axillary adenopathy Lungs no rales or rhonchi, good excursion bilaterally Heart regular rate and rhythm, no murmur appreciated Abd soft, nontender, positive bowel sounds, no liver or spleen tip palpated on exam, no fluid wave MSK no focal spinal tenderness, no joint edema Neuro: non-focal, well-oriented, appropriate affect Breasts: Deferred  Lab Results  Component Value Date   WBC 11.6 (H) 04/02/2016   HGB 13.5 04/02/2016   HCT 41.0 04/02/2016   MCV 83 04/02/2016   PLT 224 04/02/2016   Lab Results  Component Value Date   FERRITIN 1,069 (H) 11/29/2015   IRON 31 (L) 11/29/2015   TIBC 274 11/29/2015   UIBC 243 11/29/2015   IRONPCTSAT 11 (L) 11/29/2015   Lab Results  Component Value Date   RETICCTPCT 3.1 (H) 03/30/2015   RBC 4.93 04/02/2016   RETICCTABS 131.4 03/30/2015   No results found for: Nils Pyle [facility] Lab Results  Component Value Date   IGGSERUM 1250 06/09/2007   IGA 334 06/09/2007   IGMSERUM 244 06/09/2007   Lab Results  Component Value Date   TOTALPROTELP 7.0 06/09/2007     Chemistry      Component Value Date/Time   NA 138 03/19/2016 1030   NA 134 11/29/2015 1458   NA 139 07/30/2015 1302   K 3.7 03/19/2016 1030   K 3.4 (L) 11/29/2015 1458   K 3.5 07/30/2015 1302    CL 100 03/19/2016 1030   CL 98 11/29/2015 1458   CO2 29 03/19/2016 1030   CO2 21 11/29/2015 1458   CO2 25 07/30/2015 1302   BUN 21 03/19/2016 1030   BUN 19 11/29/2015 1458   BUN 16.8 07/30/2015 1302   CREATININE 0.83 03/19/2016 1030   CREATININE 0.75 11/29/2015 1458   CREATININE 0.8 07/30/2015 1302      Component Value Date/Time   CALCIUM 9.0 03/19/2016 1030   CALCIUM 8.4 (L) 11/29/2015 1458   CALCIUM 8.8 07/30/2015 1302   ALKPHOS 106 03/19/2016 1030  ALKPHOS 103 11/29/2015 1458   ALKPHOS 99 07/30/2015 1302   AST 20 03/19/2016 1030   AST 25 11/29/2015 1458   AST 36 (H) 07/30/2015 1302   ALT 31 03/19/2016 1030   ALT 40 (H) 11/29/2015 1458   ALT 68 (H) 07/30/2015 1302   BILITOT 0.5 03/19/2016 1030   BILITOT 0.4 11/29/2015 1458   BILITOT 0.41 07/30/2015 1302     Impression and Plan: Ms. Staudinger is 60 yo white female with iron deficiency anemia secondary to gastric bypass and malabsorption. She is doing well and has no complaints at this time. She received Feraheme twice in July and has had a nice response.  Her Hgb is up to 13.5 with an MCV of 83. We will see what her iron studies show and bring her back in for infusion next week if needed.  We will go ahead and plan to see her back in 4 months for labs and follow-up. She will contact us with any questions or concerns. We can certainly see her sooner if need be.   Eliezer Bottom, NP 11/8/20173:30 PM

## 2016-04-02 NOTE — Telephone Encounter (Signed)
Patient wants Rx sent to [place], [place] [phone] (Phone) [phone] (Fax

## 2016-04-03 LAB — IRON AND TIBC
%SAT: 13 % — ABNORMAL LOW (ref 21–57)
Iron: 35 ug/dL — ABNORMAL LOW (ref 41–142)
TIBC: 279 ug/dL (ref 236–444)
UIBC: 244 ug/dL (ref 120–384)

## 2016-04-03 LAB — RETICULOCYTES: RETICULOCYTE COUNT: 1.9 % (ref 0.6–2.6)

## 2016-04-03 LAB — FERRITIN: FERRITIN: 848 ng/mL — AB (ref 9–269)

## 2016-04-04 MED ORDER — ZAFIRLUKAST 20 MG PO TABS: 20.0000 mg | ORAL_TABLET | Freq: Two times a day (BID) | ORAL | 0 refills | Status: DC

## 2016-04-04 NOTE — Telephone Encounter (Signed)
Medication filled to pharmacy as requested.   

## 2016-04-07 ENCOUNTER — Telehealth: Payer: Self-pay | Admitting: *Deleted

## 2016-04-07 NOTE — Telephone Encounter (Addendum)
Patient aware of results. Message sent to scheduler  ----- Message from Eliezer Bottom, NP sent at 04/03/2016  1:46 PM EST ----- Regarding: Iron  Iron is still low. She will need 2 doses of Feraheme scheduled please. Thank you!  Sarah  ----- Message ----- From: Interface, Lab In Three Zero One Sent: 04/02/2016   3:17 PM To: Eliezer Bottom, NP

## 2016-04-09 ENCOUNTER — Ambulatory Visit (HOSPITAL_BASED_OUTPATIENT_CLINIC_OR_DEPARTMENT_OTHER): Payer: Commercial Managed Care - HMO

## 2016-04-09 VITALS — BP 109/37 | HR 77 | Temp 98.3°F | Resp 18

## 2016-04-09 DIAGNOSIS — D508 Other iron deficiency anemias: Secondary | ICD-10-CM

## 2016-04-09 MED ORDER — SODIUM CHLORIDE 0.9 % IV SOLN
510.0000 mg | Freq: Once | INTRAVENOUS | Status: AC
  Administered 2016-04-09: 510 mg via INTRAVENOUS
  Filled 2016-04-09: qty 17

## 2016-04-09 MED ORDER — SODIUM CHLORIDE 0.9 % IV SOLN
Freq: Once | INTRAVENOUS | Status: AC
  Administered 2016-04-09: 14:00:00 via INTRAVENOUS

## 2016-04-09 NOTE — Patient Instructions (Signed)

## 2016-04-18 ENCOUNTER — Ambulatory Visit (HOSPITAL_BASED_OUTPATIENT_CLINIC_OR_DEPARTMENT_OTHER): Payer: Commercial Managed Care - HMO

## 2016-04-18 VITALS — BP 134/42 | HR 70 | Temp 97.8°F | Resp 20

## 2016-04-18 DIAGNOSIS — D508 Other iron deficiency anemias: Secondary | ICD-10-CM

## 2016-04-18 MED ORDER — SODIUM CHLORIDE 0.9 % IV SOLN
510.0000 mg | Freq: Once | INTRAVENOUS | Status: AC
  Administered 2016-04-18: 510 mg via INTRAVENOUS
  Filled 2016-04-18: qty 17

## 2016-04-18 MED ORDER — SODIUM CHLORIDE 0.9 % IV SOLN
Freq: Once | INTRAVENOUS | Status: AC
  Administered 2016-04-18: 13:00:00 via INTRAVENOUS

## 2016-04-18 NOTE — Patient Instructions (Signed)
Ferumoxytol injection What is this medicine? FERUMOXYTOL is an iron complex. Iron is used to make healthy red blood cells, which carry oxygen and nutrients throughout the body. This medicine is used to treat iron deficiency anemia in people with chronic kidney disease. COMMON BRAND NAME(S): Feraheme What should I tell my health care provider before I take this medicine? They need to know if you have any of these conditions: -anemia not caused by low iron levels -high levels of iron in the blood -magnetic resonance imaging (MRI) test scheduled -an unusual or allergic reaction to iron, other medicines, foods, dyes, or preservatives -pregnant or trying to get pregnant -breast-feeding How should I use this medicine? This medicine is for injection into a vein. It is given by a health care professional in a hospital or clinic setting. Talk to your pediatrician regarding the use of this medicine in children. Special care may be needed. What if I miss a dose? It is important not to miss your dose. Call your doctor or health care professional if you are unable to keep an appointment. What may interact with this medicine? This medicine may interact with the following medications: -other iron products What should I watch for while using this medicine? Visit your doctor or healthcare professional regularly. Tell your doctor or healthcare professional if your symptoms do not start to get better or if they get worse. You may need blood work done while you are taking this medicine. You may need to follow a special diet. Talk to your doctor. Foods that contain iron include: whole grains/cereals, dried fruits, beans, or peas, leafy green vegetables, and organ meats (liver, kidney). What side effects may I notice from receiving this medicine? Side effects that you should report to your doctor or health care professional as soon as possible: -allergic reactions like skin rash, itching or hives, swelling of the  face, lips, or tongue -breathing problems -changes in blood pressure -feeling faint or lightheaded, falls -fever or chills -flushing, sweating, or hot feelings -swelling of the ankles or feet Side effects that usually do not require medical attention (report to your doctor or health care professional if they continue or are bothersome): -diarrhea -headache -nausea, vomiting -stomach pain Where should I keep my medicine? This drug is given in a hospital or clinic and will not be stored at home.  2017 Elsevier/Gold Standard (2015-06-14 12:41:49)  

## 2016-04-22 ENCOUNTER — Ambulatory Visit (INDEPENDENT_AMBULATORY_CARE_PROVIDER_SITE_OTHER): Payer: Commercial Managed Care - HMO | Admitting: *Deleted

## 2016-04-22 DIAGNOSIS — E538 Deficiency of other specified B group vitamins: Secondary | ICD-10-CM

## 2016-04-22 MED ORDER — CYANOCOBALAMIN 1000 MCG/ML IJ SOLN
1000.0000 ug | Freq: Once | INTRAMUSCULAR | Status: AC
  Administered 2016-04-22: 1000 ug via INTRAMUSCULAR

## 2016-04-22 NOTE — Progress Notes (Signed)
Noted  

## 2016-04-22 NOTE — Progress Notes (Signed)
Pre visit review using our clinic review tool, if applicable. No additional management support is needed unless otherwise documented below in the visit note.  Patient tolerated injection well.  Next appointment: 05/22/16  Dorrene German, RN

## 2016-04-23 ENCOUNTER — Other Ambulatory Visit: Payer: Self-pay | Admitting: Internal Medicine

## 2016-05-01 ENCOUNTER — Ambulatory Visit (INDEPENDENT_AMBULATORY_CARE_PROVIDER_SITE_OTHER): Payer: Commercial Managed Care - HMO | Admitting: Obstetrics & Gynecology

## 2016-05-01 VITALS — BP 132/80 | HR 88 | Resp 18 | Ht 65.0 in | Wt 303.0 lb

## 2016-05-01 DIAGNOSIS — C541 Malignant neoplasm of endometrium: Secondary | ICD-10-CM | POA: Diagnosis not present

## 2016-05-01 DIAGNOSIS — N3090 Cystitis, unspecified without hematuria: Secondary | ICD-10-CM

## 2016-05-01 DIAGNOSIS — R300 Dysuria: Secondary | ICD-10-CM | POA: Diagnosis not present

## 2016-05-01 LAB — POCT URINALYSIS DIPSTICK
BILIRUBIN UA: NEGATIVE
Blood, UA: NEGATIVE
Glucose, UA: NEGATIVE
KETONES UA: NEGATIVE
Nitrite, UA: NEGATIVE
PH UA: 6
Protein, UA: NEGATIVE
Urobilinogen, UA: NEGATIVE

## 2016-05-01 MED ORDER — NYSTATIN 100000 UNIT/GM EX CREA: TOPICAL_CREAM | Freq: Two times a day (BID) | CUTANEOUS | 1 refills | Status: DC

## 2016-05-01 MED ORDER — SULFAMETHOXAZOLE-TRIMETHOPRIM 800-160 MG PO TABS: 1.0000 | ORAL_TABLET | Freq: Two times a day (BID) | ORAL | 0 refills | Status: DC

## 2016-05-02 ENCOUNTER — Telehealth: Payer: Self-pay | Admitting: *Deleted

## 2016-05-02 LAB — URINE CULTURE: Organism ID, Bacteria: NO GROWTH

## 2016-05-02 MED ORDER — OMEPRAZOLE 40 MG PO CPDR: 40.0000 mg | DELAYED_RELEASE_CAPSULE | Freq: Every day | ORAL | 1 refills | Status: DC

## 2016-05-02 NOTE — Telephone Encounter (Signed)
Received fax from [facility] requesting refills of Omeprazole.  Refills sent.

## 2016-05-03 ENCOUNTER — Encounter: Payer: Self-pay | Admitting: Obstetrics & Gynecology

## 2016-05-03 NOTE — Progress Notes (Signed)
Patient ID: [name], female   DOB: 02/16/1956, 60 y.o.   MRN: [id number]  GYNECOLOGY  VISIT   HPI: 60 y.o. G0P0000 Single Caucasian female with hx of endometrial cancer here for six month follow up.  Pt has refused to follow up with gyn oncology.  She was diagnosed in 8/14 with endometrial adenocarcinoma.  Saw Dr. Wayland Salinas.  MRI of abd and pelvis was done 9/14 showing IUD would be good option (given her co-morbidities).  IUD was placed 02/28/13.  She was also having yearly endometrial biopsies.    In the fall, 2016, her biopsy showed atypical cells.  D&C showed negative pathology.  IUD was removed at this time.  I discussed pt's care with Dr. Denman George.  She recommended continue with IUD and stop endometrial biopsies unless bleeding changed.  Pt was having recurrent spotting before the D&C.  Second Mirena IUD was placed 04/24/15.    Since then, she's had no spotting or irregular bleeding.  She is very pleased with this.  Denies any new gynecological issues.  Denies vaginal discharge.  She reports some mild dysuria that has been present a few days.  She has some minimal odor change in her urine.  Denies hematuria.  Denies bowel function changes.    Pt has significant joint issues due to obesity which continue but she knows until she is able to have permanent weight loss then little is going to change.     GYNECOLOGIC HISTORY: Patient's last menstrual period was 05/26/2009. Contraception: PMP  Patient Active Problem List   Diagnosis Date Noted  . Ureteral calculus, right 07/17/2014  . Osteomyelitis ([place]) 06/14/2014  . Pernicious anemia 02/20/2014  . Reflux esophagitis 02/02/2014  . Edema 11/23/2013  . Foot pain 03/31/2013  . Endometrial adenocarcinoma ([place]) 02/21/2013  . Trochanteric bursitis of left hip 09/13/2012  . Fatty liver 01/07/2012  . Mid back pain 09/30/2011  . Weight gain 07/02/2011  . Nonspecific abnormal unspecified cardiovascular function study 04/30/2011  . Hx of papillary  thyroid carcinoma 04/07/2011  . Low back pain 04/01/2011  . Atrial fibrillation ([place]) 02/05/2011  . Hypoparathyroidism ([place]) 01/07/2011  . GERD 05/22/2009  . LEUKOCYTOSIS 03/24/2009  . OBESITY, MORBID 12/20/2008  . RHINOSINUSITIS, RECURRENT 09/07/2008  . Vitamin D deficiency 05/10/2007  . Iron deficiency anemia secondary to malabsorption  05/10/2007  . HYPOTHYROIDISM 01/05/2007  . Essential hypertension 01/05/2007  . ALLERGIC RHINITIS 01/05/2007  . Asthma 01/05/2007    Past Medical History:  Diagnosis Date  . Abnormal Pap smear    years ago/no biopsy  . Allergic rhinitis   . Anemia, iron deficiency   . Arthritis    back- lower  . Asthma   . Atrial fibrillation ([place]) August 2012   OFF XARELTO LAST MONTH DUE TO BLEEDING IN STOOL  . Bursitis of hip left  . Dysrhythmia    afib, followed by Dr. Stanford Breed   . Edema of both legs   . Fatty liver 01/07/2012  . Fibroid 1974   fibroid cyst on left fallopian tube  . Fibromyalgia   . Foot ulcer ([place])    AREA HEALED RIGHT FOOT  . GERD (gastroesophageal reflux disease)   . Headache(784.0)    occasional sinus headache   . History of blood transfusion JULY 2015  . Hypertension   . Hypoparathyroidism ([place]) 01/07/2011  . Hypothyroidism   . Kidney stone 10/03/2015   passed on their own  . Morbid obesity ([place])   . Nephrolithiasis 2/16, 9/16   SEES DR  GRAPEY  . Pernicious anemia 02/20/2014   followed by Debbrah Alar  . PONV (postoperative nausea and vomiting)   . Seizures ([place])    infancy secondary to fever  . Thyroid cancer ([place]) 2011   THYROIDECTOMY DONE  . Uterine cancer ([place]) 2014   Mirena IUD  . Vitamin D deficiency     Past Surgical History:  Procedure Laterality Date  . AMPUTATION Right 05/18/2014   Procedure: RIGHT FIFTH RAY AMPUTATION FOOT;  Surgeon: Wylene Simmer, MD;  Location: [facility];  Service: Orthopedics;  Laterality: Right;  . APPENDECTOMY    . BUNIONECTOMY     bilateral  . COLONOSCOPY WITH PROPOFOL N/A  02/02/2014   Procedure: COLONOSCOPY WITH PROPOFOL;  Surgeon: Irene Shipper, MD;  Location: [facility];  Service: Endoscopy;  Laterality: N/A;  . cyst on ovary removed     . CYSTOSCOPY W/ RETROGRADES  03/03/2012   Procedure: CYSTOSCOPY WITH RETROGRADE PYELOGRAM;  Surgeon: Bernestine Amass, MD;  Location: [facility];  Service: Urology;  Laterality: Bilateral;  . CYSTOSCOPY WITH RETROGRADE PYELOGRAM, URETEROSCOPY AND STENT PLACEMENT Left 08/25/2012   Procedure: CYSTOSCOPY WITH RETROGRADE PYELOGRAM, URETEROSCOPY;  Surgeon: Bernestine Amass, MD;  Location: [facility];  Service: Urology;  Laterality: Left;  . DILATION AND CURETTAGE OF UTERUS N/A 02/21/2013   Procedure: DILATATION AND CURETTAGE;  Surgeon: Lyman Speller, MD;  Location: [facility];  Service: Gynecology;  Laterality: N/A;  . DILATION AND CURETTAGE OF UTERUS N/A 04/09/2015   Procedure: [place] IUD removal;  Surgeon: Megan Salon, MD;  Location: [facility];  Service: Gynecology;  Laterality: N/A;  Patient weight 307lbs  . ESOPHAGOGASTRODUODENOSCOPY (EGD) WITH PROPOFOL N/A 02/02/2014   Procedure: ESOPHAGOGASTRODUODENOSCOPY (EGD) WITH PROPOFOL;  Surgeon: Irene Shipper, MD;  Location: [facility];  Service: Endoscopy;  Laterality: N/A;  . GASTRIC BYPASS  1974  . HOLMIUM LASER APPLICATION Left 0000000   Procedure: HOLMIUM LASER APPLICATION;  Surgeon: Bernestine Amass, MD;  Location: [facility];  Service: Urology;  Laterality: Left;  . LITHOTRIPSY  03/2012  . LITHOTRIPSY  2/16  . THYROIDECTOMY  05/15/2010  . TONSILLECTOMY AND ADENOIDECTOMY    . URETEROSCOPY  03/03/2012   Procedure: URETEROSCOPY;  Surgeon: Bernestine Amass, MD;  Location: [facility];  Service: Urology;  Laterality: Left;    MEDS:  Reviewed in EPIC and UTD  ALLERGIES: Other; Amoxicillin-pot clavulanate; Food; Penicillins; and Tomato  Family History  Problem Relation Age of Onset  . Hypertension Father   . Diabetes Father   . Lung cancer Father   . Heart attack Father     MI at  age 62  . Hypertension Mother   . Hyperthyroidism Mother   . Heart disease Mother   . Heart attack Mother     MI at age 36  . Asthma Brother   . Hypertension Brother     younger  . Heart disease Brother     older  . Colon cancer Neg Hx   . Esophageal cancer Neg Hx   . Stomach cancer Neg Hx   . Kidney disease Neg Hx   . Liver disease Neg Hx    SH:  Single, non smoker  Review of Systems  Gastrointestinal: Negative.   Genitourinary: Positive for dysuria (mild).    PHYSICAL EXAMINATION:    BP 132/80 (BP Location: Left Arm, Patient Position: Sitting, Cuff Size: Large)   Pulse 88   Resp 18   Ht 5\' 5"  (1.651 m)   Wt [name])  303 lb (137.4 kg)   LMP 05/26/2009   BMI 50.42 kg/m     General appearance: alert, cooperative and appears stated age Abdomen: soft, non-tender; bowel sounds normal; no masses,  no organomegaly  Pelvic: External genitalia:  no lesions              Urethra:  normal appearing urethra with no masses, tenderness or lesions              Bartholins and Skenes: normal                 Vagina: normal appearing vagina with normal color and discharge, no lesions              Cervix: no lesions and IUD string noted              Bimanual Exam:  Uterus:  normal size, contour, position, consistency, mobility, non-tender              Adnexa: no mass, fullness, tenderness              Rectovaginal: Yes.  .  Confirms.              Anus:  normal sphincter tone, no lesions  Chaperone was present for exam.  Assessment: Endometrial adenocarcinoma diagnosed 9/14 being treated with Mirena IUD, second one placed 04/24/15.  No bleeding since. Dysuria  Plan: Pap only obtained today.  If normal, pt will follow up in six months.  I am seeing her every six months.  Pt knows to call with bleeding changes.   Urine micro and culture pending.

## 2016-05-07 ENCOUNTER — Other Ambulatory Visit: Payer: Self-pay | Admitting: Family

## 2016-05-07 ENCOUNTER — Other Ambulatory Visit: Payer: Self-pay | Admitting: Cardiology

## 2016-05-07 LAB — PAP IG (IMAGE GUIDED)

## 2016-05-07 MED ORDER — TRAMADOL HCL 50 MG PO TABS: ORAL_TABLET | ORAL | 0 refills | Status: DC

## 2016-05-07 NOTE — Telephone Encounter (Signed)
Rx faxed to the pharmacy at 4:35pm.

## 2016-05-07 NOTE — Telephone Encounter (Signed)
Received fax from [facility] requesting refill of tramadol 50mg . Last Rx:  03/19/16, #30 Last OV: 03/19/16 Next OV: 09/17/16 UDS: 03/19/16 and due 06/19/16.  Rx printed and forwarded to PCP for signature.

## 2016-05-09 ENCOUNTER — Other Ambulatory Visit: Payer: Self-pay | Admitting: *Deleted

## 2016-05-12 MED ORDER — DILTIAZEM HCL ER COATED BEADS 240 MG PO CP24: 240.0000 mg | ORAL_CAPSULE | Freq: Every day | ORAL | 0 refills | Status: DC

## 2016-05-12 NOTE — Addendum Note (Signed)
Addended by: Zebedee Iba on: 05/12/2016 07:45 AM   Modules accepted: Orders

## 2016-05-14 ENCOUNTER — Other Ambulatory Visit: Payer: Self-pay | Admitting: Cardiology

## 2016-05-14 MED ORDER — DILTIAZEM HCL ER COATED BEADS 240 MG PO CP24: 240.0000 mg | ORAL_CAPSULE | Freq: Every day | ORAL | 1 refills | Status: DC

## 2016-05-14 NOTE — Telephone Encounter (Signed)
New message        *STAT* If patient is at the pharmacy, call can be transferred to refill team.   1. Which medications need to be refilled? (please list name of each medication and dose if known) diltiazem 240mg  2. Which pharmacy/location (including street and city if local pharmacy) is medication to be sent to? [facility] on [address] in [place]  3. Do they need a 30 day or 90 day supply? 30 day

## 2016-05-14 NOTE — Telephone Encounter (Signed)
Returned call to patient. Informed her she will need a MD appt - scheduled for Jun 17, 2016 Rx(s) sent to pharmacy electronically.

## 2016-05-22 ENCOUNTER — Ambulatory Visit (INDEPENDENT_AMBULATORY_CARE_PROVIDER_SITE_OTHER): Payer: Commercial Managed Care - HMO | Admitting: Behavioral Health

## 2016-05-22 DIAGNOSIS — E538 Deficiency of other specified B group vitamins: Secondary | ICD-10-CM | POA: Diagnosis not present

## 2016-05-22 MED ORDER — CYANOCOBALAMIN 1000 MCG/ML IJ SOLN
1000.0000 ug | Freq: Once | INTRAMUSCULAR | Status: AC
  Administered 2016-05-22: 1000 ug via INTRAMUSCULAR

## 2016-05-22 NOTE — Progress Notes (Addendum)
Pre visit review using our clinic review tool, if applicable. No additional management support is needed unless otherwise documented below in the visit note.  Patient came in clinic today for B12 injection. IM given in Left Deltoid. Patient tolerated injection well.  Next appointment scheduled for 06/24/16 at 9:15 AM.  Reviewed not and ok that was given.   Saguier, Percell Miller, PA-C

## 2016-05-23 ENCOUNTER — Other Ambulatory Visit: Payer: Self-pay | Admitting: Family

## 2016-05-28 ENCOUNTER — Other Ambulatory Visit: Payer: Self-pay | Admitting: Rheumatology

## 2016-05-28 ENCOUNTER — Other Ambulatory Visit: Payer: Self-pay | Admitting: Family

## 2016-05-29 ENCOUNTER — Telehealth: Payer: Self-pay | Admitting: Rheumatology

## 2016-05-29 NOTE — Telephone Encounter (Signed)
ok to give her 30 day supply

## 2016-05-29 NOTE — Telephone Encounter (Signed)
Last Visit: 01/07/16 Next Visit due February 2018. Message sent to the front to schedule patient.  Okay to refill Cyclobenzaprine and Robaxin?

## 2016-05-29 NOTE — Telephone Encounter (Signed)
LMOM for patient to call back to schedule appointment.  

## 2016-05-29 NOTE — Telephone Encounter (Signed)
-----   Message from Carole Binning, LPN sent at [phone]  9:15 AM EST ----- Regarding: Please schedule patient for follow up visit Please schedule patient for follow up visit. Patient due February 2018. Thanks!

## 2016-06-08 DIAGNOSIS — M25522 Pain in left elbow: Secondary | ICD-10-CM | POA: Diagnosis not present

## 2016-06-08 DIAGNOSIS — M25562 Pain in left knee: Secondary | ICD-10-CM | POA: Diagnosis not present

## 2016-06-08 DIAGNOSIS — S40022A Contusion of left upper arm, initial encounter: Secondary | ICD-10-CM | POA: Diagnosis not present

## 2016-06-09 NOTE — Progress Notes (Signed)
HPI: FU atrial fibrillation. Echocardiogram January of 2012 showed normal LV function, mild left ventricular hypertrophy and mild left atrial enlargement. Myoview July 2012 showed EF 64% mild ischemia in the inferior wall. We elected to treat medically. Patient was also having palpitations and near syncopal episodes. A CardioNet revealed sinus rhythm with PACs, brief runs of PAT and brief runs of PAF. MRA in September of 2014 showed no aneurysm. Xarelto DCed previously due to GI bleed; WU showed esophagitis. Since last seen, she has some dyspnea on exertion. She has some chest tightness predominantly in the afternoons when she has bronchitis or asthma. Occasional palpitations. No syncope. No melena or hematochezia.  Current Outpatient Prescriptions  Medication Sig Dispense Refill  . albuterol (PROVENTIL) (2.5 MG/3ML) 0.083% nebulizer solution INHALE 1 VIAL VIA NEBULIZER EVERY 4 HOURS AS NEEDED WHEEZING 75 mL 0  . aspirin EC 81 MG tablet Take 1 tablet (81 mg total) by mouth daily. 90 tablet 3  . calcitRIOL (ROCALTROL) 0.5 MCG capsule Take 1.5 mcg by mouth daily.     . Cyanocobalamin (VITAMIN B-12 IJ) Inject as directed every 30 (thirty) days.    . cyclobenzaprine (FLEXERIL) 10 MG tablet TAKE 1 TABLET BY MOUTH  EVERY NIGHT AT BEDTIME AS  NEEDED 30 tablet 0  . desoximetasone (TOPICORT) 0.05 % cream Apply topically 2 (two) times daily. 30 g 0  . diltiazem (CARDIZEM CD) 240 MG 24 hr capsule Take 1 capsule (240 mg total) by mouth daily. 30 capsule 1  . DIPHENHYDRAMINE-PSEUDOEPHED PO Take 1 tablet by mouth every 6 (six) hours as needed (for allegies).    . fluticasone (FLONASE) 50 MCG/ACT nasal spray Place 2 sprays into both nostrils daily as needed for allergies or rhinitis.     . furosemide (LASIX) 40 MG tablet TAKE 1 TABLET BY MOUTH  DAILY 90 tablet 1  . hydrochlorothiazide (HYDRODIURIL) 25 MG tablet Take 1 tablet by mouth  daily 90 tablet 1  . levocetirizine (XYZAL) 5 MG tablet Take 1 tablet by  mouth  every evening 90 tablet 1  . levothyroxine (SYNTHROID, LEVOTHROID) 100 MCG tablet Take 3 tablets by mouth daily.    [name] MANGANESE PO Take 1,250 mg by mouth 2 (two) times daily.    . methocarbamol (ROBAXIN) 500 MG tablet TAKE 1 TABLET BY MOUTH AT  7AM AND AT 2PM AS NEEDED 60 tablet 0  . nystatin cream (MYCOSTATIN) Apply topically 2 (two) times daily. Apply to affected area BID for up to 7 days. 30 g 1  . omeprazole (PRILOSEC) 40 MG capsule Take 1 capsule (40 mg total) by mouth daily. 90 capsule 1  . SYMBICORT 160-4.5 MCG/ACT inhaler Inhale 2 puffs by mouth two times daily 30.6 g 1  . traMADol (ULTRAM) 50 MG tablet TAKE 1 TABLET BY MOUTH EVERY 6 HOURS AS NEEDED FOR MODERATE PAIN 30 tablet 0  . valsartan (DIOVAN) 80 MG tablet Take 1 tablet by mouth  daily 90 tablet 1  . Vitamin D, Ergocalciferol, (DRISDOL) 50000 units CAPS capsule Take 1 capsule by mouth 4  times weekly 48 capsule 1  . zafirlukast (ACCOLATE) 20 MG tablet TAKE 1 TABLET BY MOUTH 2  TIMES DAILY BEFORE A MEAL. 180 tablet 0   No current facility-administered medications for this visit.      Past Medical History:  Diagnosis Date  . Abnormal Pap smear    years ago/no biopsy  . Allergic rhinitis   . Anemia, iron deficiency   . Arthritis  back- lower  . Asthma   . Atrial fibrillation ([place]) August 2012   OFF XARELTO LAST MONTH DUE TO BLEEDING IN STOOL  . Bursitis of hip left  . Dysrhythmia    afib, followed by Dr. Stanford Breed   . Edema of both legs   . Fatty liver 01/07/2012  . Fibroid 1974   fibroid cyst on left fallopian tube  . Fibromyalgia   . Foot ulcer ([place])    AREA HEALED RIGHT FOOT  . GERD (gastroesophageal reflux disease)   . Headache(784.0)    occasional sinus headache   . History of blood transfusion JULY 2015  . Hypertension   . Hypoparathyroidism ([place]) 01/07/2011  . Hypothyroidism   . Kidney stone Oct 05, 2015   passed on their own  . Morbid obesity ([place])   . Nephrolithiasis 2/16, 9/16   SEES DR Risa Grill  .  Pernicious anemia 02/20/2014   followed by Debbrah Alar  . PONV (postoperative nausea and vomiting)   . Seizures ([place])    infancy secondary to fever  . Thyroid cancer ([place]) 2011   THYROIDECTOMY DONE  . Uterine cancer ([place]) 2014   Mirena IUD  . Vitamin D deficiency     Past Surgical History:  Procedure Laterality Date  . AMPUTATION Right 05/18/2014   Procedure: RIGHT FIFTH RAY AMPUTATION FOOT;  Surgeon: Wylene Simmer, MD;  Location: [facility];  Service: Orthopedics;  Laterality: Right;  . APPENDECTOMY    . BUNIONECTOMY     bilateral  . COLONOSCOPY WITH PROPOFOL N/A 02/02/2014   Procedure: COLONOSCOPY WITH PROPOFOL;  Surgeon: Irene Shipper, MD;  [facility];  Service: Endoscopy;  Laterality: N/A;  . cyst on ovary removed     . CYSTOSCOPY W/ RETROGRADES  03/03/2012   Procedure: CYSTOSCOPY WITH RETROGRADE PYELOGRAM;  Surgeon: Bernestine Amass, MD;  [facility];  Service: Urology;  Laterality: Bilateral;  . CYSTOSCOPY WITH RETROGRADE PYELOGRAM, URETEROSCOPY AND STENT PLACEMENT Left 08/25/2012   Procedure: CYSTOSCOPY WITH RETROGRADE PYELOGRAM, URETEROSCOPY;  Surgeon: Bernestine Amass, MD;  [facility];  Service: Urology;  Laterality: Left;  . DILATION AND CURETTAGE OF UTERUS N/A 02/21/2013   Procedure: DILATATION AND CURETTAGE;  Surgeon: Lyman Speller, MD;  Location: [facility];  Service: Gynecology;  Laterality: N/A;  . DILATION AND CURETTAGE OF UTERUS N/A 04/09/2015   Procedure: [place] IUD removal;  Surgeon: Megan Salon, MD;  Location: [facility];  Service: Gynecology;  Laterality: N/A;  Patient weight 307lbs  . ESOPHAGOGASTRODUODENOSCOPY (EGD) WITH PROPOFOL N/A 02/02/2014   Procedure: ESOPHAGOGASTRODUODENOSCOPY (EGD) WITH PROPOFOL;  Surgeon: Irene Shipper, MD;  [facility];  Service: Endoscopy;  Laterality: N/A;  . GASTRIC BYPASS  1974  . HOLMIUM LASER APPLICATION Left 0000000   Procedure: HOLMIUM LASER APPLICATION;  Surgeon: Bernestine Amass, MD;   [facility];  Service: Urology;  Laterality: Left;  . LITHOTRIPSY  03/2012  . LITHOTRIPSY  2/16  . THYROIDECTOMY  05/15/2010  . TONSILLECTOMY AND ADENOIDECTOMY    . URETEROSCOPY  03/03/2012   Procedure: URETEROSCOPY;  Surgeon: Bernestine Amass, MD;  [facility];  Service: Urology;  Laterality: Left;    Social History   Social History  . Marital status: Single    Spouse name: N/A  . Number of children: 0  . Years of education: N/A   Occupational History  . works in Insurance claims handler   . 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 Smoking  status: Former Smoker    Packs/day: 0.50    Years: 25.00    Types: Cigarettes    Start date: 12/24/1970    Quit date: 05/27/1995  . Smokeless tobacco: Never Used     Comment: quit smoking 19 years ago  . Alcohol use 0.0 oz/week     Comment: 1/2 glass per month  . Drug use: No  . Sexual activity: Yes    Partners: Male    Birth control/ protection: Post-menopausal   Other Topics Concern  . Not on file   Social History Narrative   Occupation: works in Insurance claims handler - Field seismologist   Single       Former Smoker - quit tobacco 12 years ago.  She was light smoker for 10 years.                 Family History  Problem Relation Age of Onset  . Hypertension Father   . Diabetes Father   . Lung cancer Father   . Heart attack Father     MI at age 72  . Hypertension Mother   . Hyperthyroidism Mother   . Heart disease Mother   . Heart attack Mother     MI at age 76  . Asthma Brother   . Hypertension Brother     younger  . Heart disease Brother     older  . Colon cancer Neg Hx   . Esophageal cancer Neg Hx   . Stomach cancer Neg Hx   . Kidney disease Neg Hx   . Liver disease Neg Hx     ROS: Foot pain but no fevers or chills, productive cough, hemoptysis, dysphasia, odynophagia, melena, hematochezia, dysuria, hematuria, rash, seizure activity, orthopnea, PND, claudication. Remaining systems  are negative.  Physical Exam: Well-developed obese in no acute distress.  Skin is warm and dry.  HEENT is normal.  Neck is supple.  Chest is clear to auscultation with normal expansion.  Cardiovascular exam is regular rate and rhythm.  Abdominal exam nontender or distended. No masses palpated. Extremities show trace to 1+ edema. neuro grossly intact  ECG-Sinus rhythm at a rate of 83. Cannot rule out prior septal infarct. Occasional PAC. Nonspecific ST changes.  A/P  1 Paroxysmal atrial fibrillation-patient remains in sinus rhythm. Continue Cardizem for rate control if atrial fibrillation recurs. She had a previous GI bleed on anticoagulation and this was discontinued. However there was no recent bleeding. We discussed the risks of anticoagulation including bleeding. We also discussed the risk of not being anticoagulated including stroke. She would like to reattempt anticoagulation. His continue aspirin. Begin apixaban 5 mg BID.  In 4 weeks check hemoglobin and renal function.Watch for any recurrent bleeding.   2 hypertension-blood pressure controlled. Continue present medications.  3 edema-we will continue with present dose of diuretics.  Check potassium and renal function.  4 morbid obesity-patient counseled on weight loss.  Kirk Ruths, MD

## 2016-06-10 DIAGNOSIS — E039 Hypothyroidism, unspecified: Secondary | ICD-10-CM | POA: Diagnosis not present

## 2016-06-17 ENCOUNTER — Ambulatory Visit (INDEPENDENT_AMBULATORY_CARE_PROVIDER_SITE_OTHER): Payer: Commercial Managed Care - HMO | Admitting: Cardiology

## 2016-06-17 ENCOUNTER — Encounter: Payer: Self-pay | Admitting: Cardiology

## 2016-06-17 VITALS — BP 122/66 | HR 83 | Ht 65.0 in | Wt 309.0 lb

## 2016-06-17 DIAGNOSIS — Z79899 Other long term (current) drug therapy: Secondary | ICD-10-CM | POA: Diagnosis not present

## 2016-06-17 DIAGNOSIS — I4891 Unspecified atrial fibrillation: Secondary | ICD-10-CM

## 2016-06-17 DIAGNOSIS — I10 Essential (primary) hypertension: Secondary | ICD-10-CM | POA: Diagnosis not present

## 2016-06-17 MED ORDER — APIXABAN 5 MG PO TABS: 5.0000 mg | ORAL_TABLET | Freq: Two times a day (BID) | ORAL | 2 refills | Status: DC

## 2016-06-17 NOTE — Patient Instructions (Signed)
Medication Instructions:  STOP- Aspirin START- Eliquis 5 mg twice a day  Labwork: CBC and BMP in 4 Weeks  Testing/Procedures: None Ordered  Follow-Up: Your physician wants you to follow-up in: 6 Months in [place]. You will receive a reminder letter in the mail two months in advance. If you don't receive a letter, please call our office to schedule the follow-up appointment.   Any Other Special Instructions Will Be Listed Below (If Applicable).   If you need a refill on your cardiac medications before your next appointment, please call your pharmacy.

## 2016-06-18 ENCOUNTER — Other Ambulatory Visit: Payer: Self-pay | Admitting: Obstetrics & Gynecology

## 2016-06-18 ENCOUNTER — Other Ambulatory Visit: Payer: Self-pay | Admitting: Family

## 2016-06-18 ENCOUNTER — Ambulatory Visit: Payer: Commercial Managed Care - HMO | Admitting: Family

## 2016-06-20 ENCOUNTER — Other Ambulatory Visit: Payer: Self-pay | Admitting: Family

## 2016-06-20 NOTE — Telephone Encounter (Signed)
Last OV: 03/19/16 Next OV:  06/24/16 Last Rf:  05/07/16,  #30 UDS: 03/19/16 and due 06/22/16  Rx printed and forwarded to PCP for signature.

## 2016-06-23 NOTE — Telephone Encounter (Signed)
Rx faxed to pharmacy  

## 2016-06-24 ENCOUNTER — Ambulatory Visit (INDEPENDENT_AMBULATORY_CARE_PROVIDER_SITE_OTHER): Payer: Commercial Managed Care - HMO | Admitting: Family

## 2016-06-24 ENCOUNTER — Ambulatory Visit: Payer: Commercial Managed Care - HMO

## 2016-06-24 ENCOUNTER — Encounter: Payer: Self-pay | Admitting: Family

## 2016-06-24 DIAGNOSIS — M25511 Pain in right shoulder: Secondary | ICD-10-CM | POA: Diagnosis not present

## 2016-06-24 DIAGNOSIS — D510 Vitamin B12 deficiency anemia due to intrinsic factor deficiency: Secondary | ICD-10-CM

## 2016-06-24 MED ORDER — NYSTATIN 100000 UNIT/GM EX CREA: TOPICAL_CREAM | Freq: Two times a day (BID) | CUTANEOUS | 1 refills | Status: DC

## 2016-06-24 MED ORDER — CYANOCOBALAMIN 1000 MCG/ML IJ SOLN
1000.0000 ug | Freq: Once | INTRAMUSCULAR | Status: AC
  Administered 2016-06-24: 1000 ug via INTRAMUSCULAR

## 2016-06-24 NOTE — Progress Notes (Signed)
Subjective:    Patient ID: [name], female    DOB: 02/17/1956, 61 y.o.   MRN: [id number]  HPI  [name] is a 61 yr old female who presents today to discuss shoulder pain.  Of note, she had a fall on 06/08/16 and was seen at [facility] (she tripped on a rug and fell on her left elbow and left knee).  She reports right shoulder pain and right wrist pain.  R shoulder pain began before she fell.  Hurts the most when she crosses her right arm across her chest.  She has been using tramadol on an as needed basis.     Review of Systems    see HPI  Past Medical History:  Diagnosis Date  . Abnormal Pap smear    years ago/no biopsy  . Allergic rhinitis   . Anemia, iron deficiency   . Arthritis    back- lower  . Asthma   . Atrial fibrillation ([place]) August 2012   OFF XARELTO LAST MONTH DUE TO BLEEDING IN STOOL  . Bursitis of hip left  . Dysrhythmia    afib, followed by Dr. Stanford Breed   . Edema of both legs   . Fatty liver 01/07/2012  . Fibroid 1974   fibroid cyst on left fallopian tube  . Fibromyalgia   . Foot ulcer ([place])    AREA HEALED RIGHT FOOT  . GERD (gastroesophageal reflux disease)   . Headache(784.0)    occasional sinus headache   . History of blood transfusion JULY 2015  . Hypertension   . Hypoparathyroidism ([place]) 01/07/2011  . Hypothyroidism   . Kidney stone 2015-10-14   passed on their own  . Morbid obesity ([place])   . Nephrolithiasis 2/16, 9/16   SEES DR Risa Grill  . Pernicious anemia 02/20/2014   followed by Debbrah Alar  . PONV (postoperative nausea and vomiting)   . Seizures ([place])    infancy secondary to fever  . Thyroid cancer ([place]) 2011   THYROIDECTOMY DONE  . Uterine cancer ([place]) 2014   Mirena IUD  . Vitamin D deficiency      Social History   Social History  . Marital status: Single    Spouse name: N/A  . Number of children: 0  . Years of education: N/A   Occupational History  . works in Insurance claims handler   . DESIGN COMPUTER [employer]    Social History Main Topics  . Smoking status: Former Smoker    Packs/day: 0.50    Years: 25.00    Types: Cigarettes    Start date: 12/24/1970    Quit date: 05/27/1995  . Smokeless tobacco: Never Used     Comment: quit smoking 19 years ago  . Alcohol use 0.0 oz/week     Comment: 1/2 glass per month  . Drug use: No  . Sexual activity: Yes    Partners: Male    Birth control/ protection: Post-menopausal   Other Topics Concern  . Not on file   Social History Narrative   Occupation: works in Insurance claims handler - Field seismologist   Single       Former Smoker - quit tobacco 12 years ago.  She was light smoker for 10 years.                 Past Surgical History:  Procedure Laterality Date  . AMPUTATION Right 05/18/2014   Procedure: RIGHT FIFTH RAY AMPUTATION FOOT;  Surgeon: Wylene Simmer, MD;  Location: [facility];  Service: Orthopedics;  Laterality: Right;  . APPENDECTOMY    . BUNIONECTOMY     bilateral  . COLONOSCOPY WITH PROPOFOL N/A 02/02/2014   Procedure: COLONOSCOPY WITH PROPOFOL;  Surgeon: Irene Shipper, MD;  [facility];  Service: Endoscopy;  Laterality: N/A;  . cyst on ovary removed     . CYSTOSCOPY W/ RETROGRADES  03/03/2012   Procedure: CYSTOSCOPY WITH RETROGRADE PYELOGRAM;  Surgeon: Bernestine Amass, MD;  [facility];  Service: Urology;  Laterality: Bilateral;  . CYSTOSCOPY WITH RETROGRADE PYELOGRAM, URETEROSCOPY AND STENT PLACEMENT Left 08/25/2012   Procedure: CYSTOSCOPY WITH RETROGRADE PYELOGRAM, URETEROSCOPY;  Surgeon: Bernestine Amass, MD;  [facility];  Service: Urology;  Laterality: Left;  . DILATION AND CURETTAGE OF UTERUS N/A 02/21/2013   Procedure: DILATATION AND CURETTAGE;  Surgeon: Lyman Speller, MD;  Location: [facility];  Service: Gynecology;  Laterality: N/A;  . DILATION AND CURETTAGE OF UTERUS N/A 04/09/2015   Procedure: [place] IUD removal;  Surgeon: Megan Salon, MD;  Location: [facility];  Service: Gynecology;  Laterality:  N/A;  Patient weight 307lbs  . ESOPHAGOGASTRODUODENOSCOPY (EGD) WITH PROPOFOL N/A 02/02/2014   Procedure: ESOPHAGOGASTRODUODENOSCOPY (EGD) WITH PROPOFOL;  Surgeon: Irene Shipper, MD;  [facility];  Service: Endoscopy;  Laterality: N/A;  . GASTRIC BYPASS  1974  . HOLMIUM LASER APPLICATION Left 0000000   Procedure: HOLMIUM LASER APPLICATION;  Surgeon: Bernestine Amass, MD;  [facility];  Service: Urology;  Laterality: Left;  . LITHOTRIPSY  03/2012  . LITHOTRIPSY  2/16  . THYROIDECTOMY  05/15/2010  . TONSILLECTOMY AND ADENOIDECTOMY    . URETEROSCOPY  03/03/2012   Procedure: URETEROSCOPY;  Surgeon: Bernestine Amass, MD;  [facility];  Service: Urology;  Laterality: Left;    Family History  Problem Relation Age of Onset  . Hypertension Father   . Diabetes Father   . Lung cancer Father   . Heart attack Father     MI at age 32  . Hypertension Mother   . Hyperthyroidism Mother   . Heart disease Mother   . Heart attack Mother     MI at age 30  . Asthma Brother   . Hypertension Brother     younger  . Heart disease Brother     older  . Colon cancer Neg Hx   . Esophageal cancer Neg Hx   . Stomach cancer Neg Hx   . Kidney disease Neg Hx   . Liver disease Neg Hx     Allergies  Allergen Reactions  . Other Anaphylaxis    NUTS  . Amoxicillin-Pot Clavulanate Other (See Comments)    headache  . Food Hives    Potato  . Penicillins Other (See Comments)    headache  . Tomato Hives    Current Outpatient Prescriptions on File Prior to Visit  Medication Sig Dispense Refill  . albuterol (PROVENTIL) (2.5 MG/3ML) 0.083% nebulizer solution INHALE 1 VIAL VIA NEBULIZER EVERY 4 HOURS AS NEEDED WHEEZING 75 mL 0  . apixaban (ELIQUIS) 5 MG TABS tablet Take 1 tablet (5 mg total) by mouth 2 (two) times daily. 180 tablet 2  . calcitRIOL (ROCALTROL) 0.5 MCG capsule Take 1.5 mcg by mouth daily.     . Cyanocobalamin (VITAMIN B-12 IJ) Inject as directed every 30 (thirty) days.    .  cyclobenzaprine (FLEXERIL) 10 MG tablet TAKE 1 TABLET BY MOUTH  EVERY NIGHT AT BEDTIME AS  NEEDED 30 tablet 0  . desoximetasone (  TOPICORT) 0.05 % cream Apply topically 2 (two) times daily. 30 g 0  . diltiazem (CARDIZEM CD) 240 MG 24 hr capsule Take 1 capsule (240 mg total) by mouth daily. 30 capsule 1  . DIPHENHYDRAMINE-PSEUDOEPHED PO Take 1 tablet by mouth every 6 (six) hours as needed (for allegies).    . fluticasone (FLONASE) 50 MCG/ACT nasal spray Place 2 sprays into both nostrils daily as needed for allergies or rhinitis.     . furosemide (LASIX) 40 MG tablet TAKE 1 TABLET BY MOUTH  DAILY 90 tablet 1  . hydrochlorothiazide (HYDRODIURIL) 25 MG tablet Take 1 tablet by mouth  daily 90 tablet 1  . levocetirizine (XYZAL) 5 MG tablet Take 1 tablet by mouth  every evening 90 tablet 1  . levothyroxine (SYNTHROID, LEVOTHROID) 100 MCG tablet Take 3 tablets by mouth daily.    [name] MANGANESE PO Take 1,250 mg by mouth 2 (two) times daily.    . methocarbamol (ROBAXIN) 500 MG tablet TAKE 1 TABLET BY MOUTH AT  7AM AND AT 2PM AS NEEDED 60 tablet 0  . nystatin cream (MYCOSTATIN) Apply topically 2 (two) times daily. Apply to affected area BID for up to 7 days. 30 g 1  . omeprazole (PRILOSEC) 40 MG capsule Take 1 capsule (40 mg total) by mouth daily. 90 capsule 1  . SYMBICORT 160-4.5 MCG/ACT inhaler Inhale 2 puffs by mouth two times daily 30.6 g 1  . traMADol (ULTRAM) 50 MG tablet TAKE 1 TABLET BY MOUTH EVERY 6 HOURS AS NEEDED FOR MODERATE PAIN 30 tablet 0  . valsartan (DIOVAN) 80 MG tablet Take 1 tablet by mouth  daily 90 tablet 1  . Vitamin D, Ergocalciferol, (DRISDOL) 50000 units CAPS capsule Take 1 capsule by mouth 4  times weekly 48 capsule 1  . zafirlukast (ACCOLATE) 20 MG tablet TAKE 1 TABLET BY MOUTH 2  TIMES DAILY BEFORE A MEAL. 180 tablet 0   No current facility-administered medications on file prior to visit.     BP 121/85 (BP Location: Right Arm, Cuff Size: Large)   Pulse 96   Temp 98.1 F (36.7  C) (Oral)   Ht 5\' 5"  (1.651 m)   Wt (!) 306 lb 6.4 oz (139 kg)   LMP 06/11/2012   SpO2 99%   BMI 50.99 kg/m    Objective:   Physical Exam  Constitutional: She appears well-developed and well-nourished.  HENT:  Head: Normocephalic and atraumatic.  Cardiovascular:  No murmur heard. Pulmonary/Chest: No respiratory distress. She has no wheezes.  Musculoskeletal:  R shoulder without swelling, + pain with adduction of right shoulder.  R wrist- + pain with extension  Psychiatric: She has a normal mood and affect. Her behavior is normal. Judgment and thought content normal.          Assessment & Plan:  Joint Pain- (Right shoulder/R wrist)- will refer to ortho for further evaluation. Avoid NSAIDS due to bariatric status.   Pernicious anemia- B12 shot given today.

## 2016-06-24 NOTE — Patient Instructions (Signed)
You will be contacted about your referral to Orthopedics.

## 2016-06-24 NOTE — Progress Notes (Signed)
Pre visit review using our clinic review tool, if applicable. No additional management support is needed unless otherwise documented below in the visit note. 

## 2016-06-24 NOTE — Telephone Encounter (Signed)
Rx was faxed to pharmacy on 06/23/16.

## 2016-06-25 ENCOUNTER — Other Ambulatory Visit: Payer: Self-pay | Admitting: Rheumatology

## 2016-06-25 NOTE — Telephone Encounter (Signed)
Last Visit: 01/07/16 Next Visit due February 2018 and message sent to the front to schedule patient.   Okay to refill Cyclobenzaprine and Robaxin?

## 2016-06-27 DIAGNOSIS — H01021 Squamous blepharitis right upper eyelid: Secondary | ICD-10-CM | POA: Diagnosis not present

## 2016-06-27 DIAGNOSIS — H16223 Keratoconjunctivitis sicca, not specified as Sjogren's, bilateral: Secondary | ICD-10-CM | POA: Diagnosis not present

## 2016-06-27 DIAGNOSIS — H01024 Squamous blepharitis left upper eyelid: Secondary | ICD-10-CM | POA: Diagnosis not present

## 2016-06-30 ENCOUNTER — Other Ambulatory Visit: Payer: Self-pay | Admitting: Family

## 2016-06-30 DIAGNOSIS — N200 Calculus of kidney: Secondary | ICD-10-CM | POA: Diagnosis not present

## 2016-07-02 DIAGNOSIS — E039 Hypothyroidism, unspecified: Secondary | ICD-10-CM | POA: Diagnosis not present

## 2016-07-02 DIAGNOSIS — Z85850 Personal history of malignant neoplasm of thyroid: Secondary | ICD-10-CM | POA: Diagnosis not present

## 2016-07-02 DIAGNOSIS — E892 Postprocedural hypoparathyroidism: Secondary | ICD-10-CM | POA: Diagnosis not present

## 2016-07-07 ENCOUNTER — Encounter (INDEPENDENT_AMBULATORY_CARE_PROVIDER_SITE_OTHER): Payer: Self-pay | Admitting: Orthopedic Surgery

## 2016-07-07 ENCOUNTER — Ambulatory Visit (INDEPENDENT_AMBULATORY_CARE_PROVIDER_SITE_OTHER): Payer: Commercial Managed Care - HMO | Admitting: Orthopedic Surgery

## 2016-07-07 ENCOUNTER — Ambulatory Visit (INDEPENDENT_AMBULATORY_CARE_PROVIDER_SITE_OTHER): Payer: Commercial Managed Care - HMO

## 2016-07-07 DIAGNOSIS — M25511 Pain in right shoulder: Secondary | ICD-10-CM

## 2016-07-07 MED ORDER — DICLOFENAC SODIUM 2 % TD SOLN: 2.0000 | Freq: Two times a day (BID) | TRANSDERMAL | 1 refills | Status: DC

## 2016-07-07 MED ORDER — LIDOCAINE HCL 1 % IJ SOLN
3.0000 mL | INTRAMUSCULAR | Status: AC | PRN
  Administered 2016-07-07: 3 mL

## 2016-07-07 MED ORDER — BUPIVACAINE HCL 0.5 % IJ SOLN
9.0000 mL | INTRAMUSCULAR | Status: AC | PRN
  Administered 2016-07-07: 9 mL via INTRA_ARTICULAR

## 2016-07-07 MED ORDER — BUPIVACAINE HCL 0.25 % IJ SOLN
0.6600 mL | INTRAMUSCULAR | Status: AC | PRN
  Administered 2016-07-07: .66 mL via INTRA_ARTICULAR

## 2016-07-07 MED ORDER — LIDOCAINE HCL 1 % IJ SOLN
5.0000 mL | INTRAMUSCULAR | Status: AC | PRN
  Administered 2016-07-07: 5 mL

## 2016-07-07 MED ORDER — TRIAMCINOLONE ACETONIDE 40 MG/ML IJ SUSP
20.0000 mg | INTRAMUSCULAR | Status: AC | PRN
  Administered 2016-07-07: 20 mg via INTRA_ARTICULAR

## 2016-07-07 MED ORDER — METHYLPREDNISOLONE ACETATE 40 MG/ML IJ SUSP
40.0000 mg | INTRAMUSCULAR | Status: AC | PRN
  Administered 2016-07-07: 40 mg via INTRA_ARTICULAR

## 2016-07-07 NOTE — Addendum Note (Signed)
Addended byBrand Males on: 07/07/2016 01:59 PM   Modules accepted: Orders

## 2016-07-07 NOTE — Progress Notes (Signed)
Office Visit Note   Patient: [name]           Date of Birth: 1955/12/10           MRN: [id number] Visit Date: 07/07/2016 Requested by: Debbrah Alar, NP [address], [place] [address] PCP: Nance Pear., NP  Subjective: Chief Complaint  Patient presents with  . Right Shoulder - Pain    HPI [name] is a 61 year old female with right shoulder pain.  Been going on for weeks and then an injury.  It still during the day but deathly worse at night.  She feels like her acromioclavicular joint is swollen.  She states on that side.  She has some pain with range of motion but does describe having full range of motion.  She's been taking tramadol which helped her sleep last night as well as Advil.  She's not a smoker.  The pain runs down the deltoid at times but is primarily in the superior aspect of the shoulder.  She does walk with a cane in her right hand.          Review of Systems All systems reviewed are negative as they relate to the chief complaint within the history of present illness.  Patient denies  fevers or chills.    Assessment & Plan: Visit Diagnoses:  1. Acute pain of right shoulder     Plan: Impression is right shoulder pain which looks like it may be a combination of acromioclavicular joint arthritis and bursitis.  Both these areas are injected today.  I would also like to try her on a topical anti-inflammatory for the acromioclavicular joint.  I will see her back in 6 weeks and we can decide for against further injections or imaging at that time depending on her response to these injections.  Her rotator cuff strength looks good in that doesn't look like she has rotator cuff tear this time  Follow-Up Instructions: No Follow-up on file.   Orders:  Orders Placed This Encounter  Procedures  . XR Shoulder Right   No orders of the defined types were placed in this encounter.     Procedures: Large Joint Inj  Date/Time:  07/07/2016 11:26 AM  Performed by: Meredith Pel  Authorized by: Meredith Pel   Consent Given by:  Patient Site marked: the procedure site was marked   Timeout: prior to procedure the correct patient, procedure, and site was verified   Indications:  Pain and diagnostic evaluation Location:  Shoulder Site:  R subacromial bursa Prep: patient was prepped and draped in usual sterile fashion   Needle Size:  18 G Needle Length:  1.5 inches Approach:  Posterior Ultrasound Guidance: No   Fluoroscopic Guidance: No   Arthrogram: No   Medications:  5 mL lidocaine 1 %; 9 mL bupivacaine 0.5 %; 40 mg methylPREDNISolone acetate 40 MG/ML Aspiration Attempted: No   Patient tolerance:  Patient tolerated the procedure well with no immediate complications Medium Joint Inj  Date/Time: 07/07/2016 11:26 AM  Performed by: Meredith Pel  Authorized by: Meredith Pel   Consent Given by:  Patient Site marked: the procedure site was marked   Timeout: prior to procedure the correct patient, procedure, and site was verified   Indications:  Pain and diagnostic evaluation Location:  Shoulder Site:  R acromioclavicular Prep: patient was prepped and draped in usual sterile fashion   Needle Size:  25 G Needle Length:  1.5 inches Approach:  Superior Ultrasound Guided: Yes   Fluoroscopic Guidance: No   Medications:  3 mL lidocaine 1 %; 20 mg triamcinolone acetonide 40 MG/ML; 0.66 mL bupivacaine 0.25 % Aspiration Attempted: No   Patient tolerance:  Patient tolerated the procedure well with no immediate complications      Clinical Data: No additional findings.  Objective: Vital Signs: LMP 06/11/2012   Physical Exam   Constitutional: Patient appears well-developed HEENT:  Head: Normocephalic Eyes:EOM are normal Neck: Normal range of motion Cardiovascular: Normal rate Pulmonary/chest: Effort normal Neurologic: Patient is alert Skin: Skin is warm Psychiatric: Patient has  normal mood and affect  Orthopedic exam demonstrates good cervical spine range of motion.  Good motor sensory function in the right arm.  Patient does have pain and tenderness to palpation of the acromioclavicular joint on the right but not on the left.  There is pain with crossed arm adduction.  Rotator cuff strength is intact to infraspinatus supraspinatus and  subscap muscle testing.  No real restriction of external rotation at 15 of abduction.  No other masses lymph adenopathy or skin changes noted in the shoulder region  xam  Special and that neverty Comments:  No specialty comments available.  Imaging: No results found.   PMFS History: Patient Active Problem List   Diagnosis Date Noted  . Ureteral calculus, right 07/17/2014  . Osteomyelitis ([place]) 06/14/2014  . Pernicious anemia 02/20/2014  . Reflux esophagitis 02/02/2014  . Edema 11/23/2013  . Foot pain 03/31/2013  . Endometrial adenocarcinoma ([place]) 02/21/2013  . Trochanteric bursitis of left hip 09/13/2012  . Fatty liver 01/07/2012  . Mid back pain 09/30/2011  . Weight gain 07/02/2011  . Nonspecific abnormal unspecified cardiovascular function study 04/30/2011  . Hx of papillary thyroid carcinoma 04/07/2011  . Low back pain 04/01/2011  . Atrial fibrillation ([place]) 02/05/2011  . Hypoparathyroidism ([place]) 01/07/2011  . GERD 05/22/2009  . LEUKOCYTOSIS 03/24/2009  . OBESITY, MORBID 12/20/2008  . RHINOSINUSITIS, RECURRENT 09/07/2008  . Vitamin D deficiency 05/10/2007  . Iron deficiency anemia secondary to malabsorption  05/10/2007  . HYPOTHYROIDISM 01/05/2007  . Essential hypertension 01/05/2007  . ALLERGIC RHINITIS 01/05/2007  . Asthma 01/05/2007   Past Medical History:  Diagnosis Date  . Abnormal Pap smear    years ago/no biopsy  . Allergic rhinitis   . Anemia, iron deficiency   . Arthritis    back- lower  . Asthma   . Atrial fibrillation ([facility]) August 2012   OFF XARELTO LAST MONTH DUE TO BLEEDING IN STOOL  .  Bursitis of hip left  . Dysrhythmia    afib, followed by Dr. Stanford Breed   . Edema of both legs   . Fatty liver 01/07/2012  . Fibroid 1974   fibroid cyst on left fallopian tube  . Fibromyalgia   . Foot ulcer ([facility])    AREA HEALED RIGHT FOOT  . GERD (gastroesophageal reflux disease)   . Headache(784.0)    occasional sinus headache   . History of blood transfusion JULY 2015  . Hypertension   . Hypoparathyroidism ([facility]) 01/07/2011  . Hypothyroidism   . Kidney stone 2015-10-18   passed on their own  . Morbid obesity ([facility])   . Nephrolithiasis 2/16, 9/16   SEES DR Risa Grill  . Pernicious anemia 02/20/2014   followed by Debbrah Alar  . PONV (postoperative nausea and vomiting)   . Seizures ([facility])    infancy secondary to fever  . Thyroid cancer ([facility]) 2011   THYROIDECTOMY DONE  . Uterine cancer ([facility]) 2014  Mirena IUD  . Vitamin D deficiency     Family History  Problem Relation Age of Onset  . Hypertension Father   . Diabetes Father   . Lung cancer Father   . Heart attack Father     MI at age 13  . Hypertension Mother   . Hyperthyroidism Mother   . Heart disease Mother   . Heart attack Mother     MI at age 10  . Asthma Brother   . Hypertension Brother     younger  . Heart disease Brother     older  . Colon cancer Neg Hx   . Esophageal cancer Neg Hx   . Stomach cancer Neg Hx   . Kidney disease Neg Hx   . Liver disease Neg Hx     Past Surgical History:  Procedure Laterality Date  . AMPUTATION Right 05/18/2014   Procedure: RIGHT FIFTH RAY AMPUTATION FOOT;  Surgeon: Wylene Simmer, MD;  Location: [facility];  Service: Orthopedics;  Laterality: Right;  . APPENDECTOMY    . BUNIONECTOMY     bilateral  . COLONOSCOPY WITH PROPOFOL N/A 02/02/2014   Procedure: COLONOSCOPY WITH PROPOFOL;  Surgeon: Irene Shipper, MD;  Location: [facility];  Service: Endoscopy;  Laterality: N/A;  . cyst on ovary removed     . CYSTOSCOPY W/ RETROGRADES  03/03/2012   Procedure: CYSTOSCOPY WITH RETROGRADE  PYELOGRAM;  Surgeon: Bernestine Amass, MD;  Location: [facility];  Service: Urology;  Laterality: Bilateral;  . CYSTOSCOPY WITH RETROGRADE PYELOGRAM, URETEROSCOPY AND STENT PLACEMENT Left 08/25/2012   Procedure: CYSTOSCOPY WITH RETROGRADE PYELOGRAM, URETEROSCOPY;  Surgeon: Bernestine Amass, MD;  Location: [facility];  Service: Urology;  Laterality: Left;  . DILATION AND CURETTAGE OF UTERUS N/A 02/21/2013   Procedure: DILATATION AND CURETTAGE;  Surgeon: Lyman Speller, MD;  Location: [facility];  Service: Gynecology;  Laterality: N/A;  . DILATION AND CURETTAGE OF UTERUS N/A 04/09/2015   Procedure: [place] IUD removal;  Surgeon: Megan Salon, MD;  Location: [facility];  Service: Gynecology;  Laterality: N/A;  Patient weight 307lbs  . ESOPHAGOGASTRODUODENOSCOPY (EGD) WITH PROPOFOL N/A 02/02/2014   Procedure: ESOPHAGOGASTRODUODENOSCOPY (EGD) WITH PROPOFOL;  Surgeon: Irene Shipper, MD;  [facility];  Service: Endoscopy;  Laterality: N/A;  . GASTRIC BYPASS  1974  . HOLMIUM LASER APPLICATION Left 0000000   Procedure: HOLMIUM LASER APPLICATION;  Surgeon: Bernestine Amass, MD;  Location[facility];  Service: Urology;  Laterality: Left;  . LITHOTRIPSY  03/2012  . LITHOTRIPSY  2/16  . THYROIDECTOMY  05/15/2010  . TONSILLECTOMY AND ADENOIDECTOMY    . URETEROSCOPY  03/03/2012   Procedure: URETEROSCOPY;  Surgeon: Bernestine Amass, MD;  Location: [facility];  Service: Urology;  Laterality: Left;   Social History   Occupational History  . works in Insurance claims handler   . 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 Smoking status: Former Smoker    Packs/day: 0.50    Years: 25.00    Types: Cigarettes    Start date: 12/24/1970    Quit date: 05/27/1995  . Smokeless tobacco: Never Used     Comment: quit smoking 19 years ago  . Alcohol use 0.0 oz/week     Comment: 1/2 glass per month  . Drug use: No  . Sexual activity: Yes    Partners: Male    Birth control/ protection:  Post-menopausal

## 2016-07-09 ENCOUNTER — Ambulatory Visit (INDEPENDENT_AMBULATORY_CARE_PROVIDER_SITE_OTHER): Payer: Commercial Managed Care - HMO | Admitting: Orthopedic Surgery

## 2016-07-22 LAB — CBC
HCT: 44.2 % (ref 35.0–45.0)
Hemoglobin: 14.5 g/dL (ref 11.7–15.5)
MCH: 27.9 pg (ref 27.0–33.0)
MCHC: 32.8 g/dL (ref 32.0–36.0)
MCV: 85 fL (ref 80.0–100.0)
MPV: 10.8 fL (ref 7.5–12.5)
PLATELETS: 151 10*3/uL (ref 140–400)
RBC: 5.2 MIL/uL — AB (ref 3.80–5.10)
RDW: 14.8 % (ref 11.0–15.0)
WBC: 13.5 10*3/uL — ABNORMAL HIGH (ref 3.8–10.8)

## 2016-07-23 ENCOUNTER — Telehealth: Payer: Self-pay | Admitting: Family

## 2016-07-23 ENCOUNTER — Ambulatory Visit (INDEPENDENT_AMBULATORY_CARE_PROVIDER_SITE_OTHER): Payer: Commercial Managed Care - HMO | Admitting: Behavioral Health

## 2016-07-23 DIAGNOSIS — E538 Deficiency of other specified B group vitamins: Secondary | ICD-10-CM | POA: Diagnosis not present

## 2016-07-23 LAB — BASIC METABOLIC PANEL
BUN: 17 mg/dL (ref 7–25)
CO2: 31 mmol/L (ref 20–31)
CREATININE: 0.74 mg/dL (ref 0.50–1.05)
Calcium: 8.5 mg/dL — ABNORMAL LOW (ref 8.6–10.4)
Chloride: 98 mmol/L (ref 98–110)
Glucose, Bld: 76 mg/dL (ref 65–99)
Potassium: 4 mmol/L (ref 3.5–5.3)
SODIUM: 138 mmol/L (ref 135–146)

## 2016-07-23 MED ORDER — CYANOCOBALAMIN 1000 MCG/ML IJ SOLN
1000.0000 ug | Freq: Once | INTRAMUSCULAR | Status: AC
  Administered 2016-07-23: 1000 ug via INTRAMUSCULAR

## 2016-07-23 NOTE — Telephone Encounter (Signed)
See my chart message

## 2016-07-23 NOTE — Progress Notes (Signed)
Noted  

## 2016-07-23 NOTE — Progress Notes (Signed)
Pre visit review using our clinic review tool, if applicable. No additional management support is needed unless otherwise documented below in the visit note.  Patient came in office today for B12 injection. IM given in Left Deltoid. Patient tolerated injection well.  Next appointment 08/21/16 at 9:00 AM.

## 2016-07-28 ENCOUNTER — Ambulatory Visit (HOSPITAL_BASED_OUTPATIENT_CLINIC_OR_DEPARTMENT_OTHER): Payer: Commercial Managed Care - HMO | Admitting: Hematology & Oncology

## 2016-07-28 ENCOUNTER — Other Ambulatory Visit (HOSPITAL_BASED_OUTPATIENT_CLINIC_OR_DEPARTMENT_OTHER): Payer: Commercial Managed Care - HMO

## 2016-07-28 VITALS — BP 131/66 | HR 84 | Temp 98.2°F | Wt 302.1 lb

## 2016-07-28 DIAGNOSIS — D508 Other iron deficiency anemias: Secondary | ICD-10-CM

## 2016-07-28 DIAGNOSIS — D509 Iron deficiency anemia, unspecified: Secondary | ICD-10-CM

## 2016-07-28 LAB — CBC WITH DIFFERENTIAL (CANCER CENTER ONLY)
BASO#: 0 10*3/uL (ref 0.0–0.2)
BASO%: 0.4 % (ref 0.0–2.0)
EOS%: 1.9 % (ref 0.0–7.0)
Eosinophils Absolute: 0.2 10*3/uL (ref 0.0–0.5)
HEMATOCRIT: 42.7 % (ref 34.8–46.6)
HGB: 13.8 g/dL (ref 11.6–15.9)
LYMPH#: 1.7 10*3/uL (ref 0.9–3.3)
LYMPH%: 15.4 % (ref 14.0–48.0)
MCH: 28.1 pg (ref 26.0–34.0)
MCHC: 32.3 g/dL (ref 32.0–36.0)
MCV: 87 fL (ref 81–101)
MONO#: 0.6 10*3/uL (ref 0.1–0.9)
MONO%: 5.1 % (ref 0.0–13.0)
NEUT#: 8.7 10*3/uL — ABNORMAL HIGH (ref 1.5–6.5)
NEUT%: 77.2 % (ref 39.6–80.0)
PLATELETS: 175 10*3/uL (ref 145–400)
RBC: 4.91 10*6/uL (ref 3.70–5.32)
RDW: 14.3 % (ref 11.1–15.7)
WBC: 11.3 10*3/uL — ABNORMAL HIGH (ref 3.9–10.0)

## 2016-07-28 LAB — COMPREHENSIVE METABOLIC PANEL (CC13)
ALK PHOS: 114 IU/L (ref 39–117)
ALT: 26 IU/L (ref 0–32)
AST: 16 IU/L (ref 0–40)
Albumin, Serum: 3.9 g/dL (ref 3.5–5.5)
Albumin/Globulin Ratio: 1.3 (ref 1.2–2.2)
BUN/Creatinine Ratio: 24 — ABNORMAL HIGH (ref 9–23)
BUN: 18 mg/dL (ref 6–24)
Bilirubin Total: 0.4 mg/dL (ref 0.0–1.2)
CO2: 27 mmol/L (ref 18–29)
Calcium, Ser: 8.2 mg/dL — ABNORMAL LOW (ref 8.7–10.2)
Chloride, Ser: 98 mmol/L (ref 96–106)
Creatinine, Ser: 0.74 mg/dL (ref 0.57–1.00)
GFR calc Af Amer: 103 mL/min/{1.73_m2} (ref >59)
GFR calc non Af Amer: 89 mL/min/{1.73_m2} (ref >59)
GLOBULIN, TOTAL: 3.1 g/dL (ref 1.5–4.5)
Glucose: 82 mg/dL (ref 65–99)
POTASSIUM: 3.6 mmol/L (ref 3.5–5.2)
SODIUM: 137 mmol/L (ref 134–144)
Total Protein: 7 g/dL (ref 6.0–8.5)

## 2016-07-28 NOTE — Progress Notes (Signed)
Hematology and Oncology Follow Up Visit  [name] [id number] 04/08/1956 61 y.o. 07/28/2016   Principle Diagnosis:  Iron deficiency anemia Pernicious anemia Gastric bypass with malabsorption  Current Therapy:   IV iron as indicated - last received in November 2017 Vitamin B-12 1 mg IM every month    Interim History: [name] is here today for a follow-up. She is doing okay. She is having a little bit of a tough time with arthritic issues. She still has a boot on her left foot. She did get a steroid injection into her left shoulder.  She last iron back in November. At that time, her iron studies showed a ferritin of 848 with iron saturation of only 13%.  She just is not able to exercise all that much.  She had no problems over the holidays. I think she did fall one time.  For some reason, she is back on ELIQUIS. I'm not sure why she is back on ELIQUIS. I wonder if she would benefit from a low-dose maintenance ELIQUIS. Again this is being managed by her family doctor.   There is no obvious bleeding. She's had no fever. She's got through the winter without any influenza. Her appetite is doing okay. She's had no nausea or vomiting.  She was having some issues with her bowels and bladder. This seems to have improved.   Overall, her performance status is ECOG 2-3.   Medications:  Allergies as of 07/28/2016      Reactions   Other Anaphylaxis   NUTS   Amoxicillin-pot Clavulanate Other (See Comments)   headache   Food Hives   Potato   Penicillins Other (See Comments)   headache   Tomato Hives      Medication List       Accurate as of 07/28/16  3:33 PM. Always use your most recent med list.          albuterol (2.5 MG/3ML) 0.083% nebulizer solution Commonly known as:  PROVENTIL INHALE 1 VIAL VIA NEBULIZER EVERY 4 HOURS AS NEEDED WHEEZING   apixaban 5 MG Tabs tablet Commonly known as:  ELIQUIS Take 1 tablet (5 mg total) by mouth 2 (two) times daily.   calcitRIOL  0.5 MCG capsule Commonly known as:  ROCALTROL Take 1.5 mcg by mouth daily.   cyclobenzaprine 10 MG tablet Commonly known as:  FLEXERIL TAKE 1 TABLET BY MOUTH  EVERY NIGHT AT BEDTIME AS  NEEDED   desoximetasone 0.05 % cream Commonly known as:  TOPICORT Apply topically 2 (two) times daily.   Diclofenac Sodium 2 % Soln Commonly known as:  PENNSAID Place 2 Squirts onto the skin 2 (two) times daily.   diltiazem 240 MG 24 hr capsule Commonly known as:  CARDIZEM CD Take 1 capsule (240 mg total) by mouth daily.   DIPHENHYDRAMINE-PSEUDOEPHED PO Take 1 tablet by mouth every 6 (six) hours as needed (for allegies).   fluticasone 50 MCG/ACT nasal spray Commonly known as:  FLONASE Place 2 sprays into both nostrils daily as needed for allergies or rhinitis.   furosemide 40 MG tablet Commonly known as:  LASIX TAKE 1 TABLET BY MOUTH  DAILY   hydrochlorothiazide 25 MG tablet Commonly known as:  HYDRODIURIL TAKE 1 TABLET BY MOUTH  DAILY   levocetirizine 5 MG tablet Commonly known as:  XYZAL Take 1 tablet by mouth  every evening   levothyroxine 100 MCG tablet Commonly known as:  SYNTHROID, LEVOTHROID Take 3 tablets by mouth daily.   MANGANESE PO Take  1,250 mg by mouth 2 (two) times daily.   methocarbamol 500 MG tablet Commonly known as:  ROBAXIN TAKE 1 TABLET BY MOUTH AT  7AM AND AT 2PM AS NEEDED   nystatin cream Commonly known as:  MYCOSTATIN Apply topically 2 (two) times daily. Apply to affected area BID for up to 7 days.   omeprazole 40 MG capsule Commonly known as:  PRILOSEC Take 1 capsule (40 mg total) by mouth daily.   SYMBICORT 160-4.5 MCG/ACT inhaler Generic drug:  budesonide-formoterol Inhale 2 puffs by mouth two times daily   traMADol 50 MG tablet Commonly known as:  ULTRAM TAKE 1 TABLET BY MOUTH EVERY 6 HOURS AS NEEDED FOR MODERATE PAIN   valsartan 80 MG tablet Commonly known as:  DIOVAN Take 1 tablet by mouth  daily   VITAMIN B-12 IJ Inject as directed  every 30 (thirty) days.   Vitamin D (Ergocalciferol) 50000 units Caps capsule Commonly known as:  DRISDOL Take 1 capsule by mouth 4  times weekly   zafirlukast 20 MG tablet Commonly known as:  ACCOLATE TAKE 1 TABLET BY MOUTH 2  TIMES DAILY BEFORE A MEAL.       Allergies:  Allergies  Allergen Reactions  . Other Anaphylaxis    NUTS  . Amoxicillin-Pot Clavulanate Other (See Comments)    headache  . Food Hives    Potato  . Penicillins Other (See Comments)    headache  . Tomato Hives    Past Medical History, Surgical history, Social history, and Family History were reviewed and updated.  Review of Systems: All other 10 point review of systems is negative.   Physical Exam:  weight is 302 lb 1 oz (137 kg) (abnormal). Her oral temperature is 98.2 F (36.8 C). Her blood pressure is 131/66 and her pulse is 84.   Wt Readings from Last 3 Encounters:  07/28/16 (!) 302 lb 1 oz (137 kg)  06/24/16 (!) 306 lb 6.4 oz (139 kg)  06/17/16 (!) 309 lb (140.2 kg)    Obese white female in no obvious distress. Her head and neck exam shows no ocular or oral lesions. She has no palpable cervical or supraclavicular lymph nodes. Thyroid is not palpable. Lungs are with some decrease at the bases. Cardiac exam regular rate and rhythm with no murmurs, rubs or bruits. Abdomen is obese but soft. Bowel sounds are present. She has no fluid wave. There is no palpable liver or spleen tip. Back exam shows no tenderness over the spine, ribs or hips. Extremities shows the walking boot on the left foot. She has chronic 1+ edema in her lower legs. Skin exam shows no rashes, ecchymoses or petechia. Neurological exam shows no focal neurological deficits.   Lab Results  Component Value Date   WBC 11.3 (H) 07/28/2016   HGB 13.8 07/28/2016   HCT 42.7 07/28/2016   MCV 87 07/28/2016   PLT 175 07/28/2016   Lab Results  Component Value Date   FERRITIN 848 (H) 04/02/2016   IRON 35 (L) 04/02/2016   TIBC 279  04/02/2016   UIBC 244 04/02/2016   IRONPCTSAT 13 (L) 04/02/2016   Lab Results  Component Value Date   RETICCTPCT 3.1 (H) 03/30/2015   RBC 4.91 07/28/2016   RETICCTABS 131.4 03/30/2015   No results found for: [facility] P[facility] Lab Results  Component Value Date   IGGSERUM 1250 06/09/2007   IGA 334 06/09/2007   IGMSERUM 244 06/09/2007   Lab Results  Component Value Date   TOTALPROTELP  7.0 06/09/2007     Chemistry      Component Value Date/Time   NA 138 07/22/2016 1537   NA 135 04/02/2016 1505   NA 139 07/30/2015 1302   K 4.0 07/22/2016 1537   K 3.3 (L) 04/02/2016 1505   K 3.5 07/30/2015 1302   CL 98 07/22/2016 1537   CL 98 04/02/2016 1505   CO2 31 07/22/2016 1537   CO2 29 04/02/2016 1505   CO2 25 07/30/2015 1302   BUN 17 07/22/2016 1537   BUN 19 04/02/2016 1505   BUN 16.8 07/30/2015 1302   CREATININE 0.74 07/22/2016 1537   CREATININE 0.8 07/30/2015 1302      Component Value Date/Time   CALCIUM 8.5 (L) 07/22/2016 1537   CALCIUM 8.4 (L) 04/02/2016 1505   CALCIUM 8.8 07/30/2015 1302   ALKPHOS 129 (H) 04/02/2016 1505   ALKPHOS 99 07/30/2015 1302   AST 20 04/02/2016 1505   AST 36 (H) 07/30/2015 1302   ALT 27 04/02/2016 1505   ALT 68 (H) 07/30/2015 1302   BILITOT 0.4 04/02/2016 1505   BILITOT 0.41 07/30/2015 1302     Impression and Plan: Ms. Melcher is 61 yo white female with iron deficiency anemia secondary to gastric bypass and malabsorption.her current health issues are labia on her being anemic. She is not anemic. The MCV is coming up nicely.  I think we can probably get her back in 4 months now. We will see what her iron studies show. I suppose that she might still be iron deficient .   I hope that her arthritic issues ghetto better. I just feel bad that she is limited by her arthritis.    Volanda Napoleon, MD 3/5/20183:33 PM

## 2016-07-29 LAB — FERRITIN

## 2016-07-29 LAB — IRON AND TIBC
%SAT: 15 % — ABNORMAL LOW (ref 21–57)
Iron: 44 ug/dL (ref 41–142)
TIBC: 291 ug/dL (ref 236–444)
UIBC: 247 ug/dL (ref 120–384)

## 2016-07-29 LAB — RETICULOCYTES: Reticulocyte Count: 1.7 % (ref 0.6–2.6)

## 2016-07-30 ENCOUNTER — Encounter: Payer: Self-pay | Admitting: Family

## 2016-07-30 ENCOUNTER — Telehealth: Payer: Self-pay | Admitting: *Deleted

## 2016-07-30 NOTE — Telephone Encounter (Addendum)
Patient is aware of results. Appointment made  ----- Message from Eliezer Bottom, NP sent at 07/29/2016  1:47 PM EST ----- Regarding: Iron  Needs one dose of IV iron this week please. Thank you!  Sarah  ----- Message ----- From: Interface, Lab In Three Zero One Sent: 07/28/2016   2:56 PM To: Eliezer Bottom, NP

## 2016-08-03 ENCOUNTER — Other Ambulatory Visit: Payer: Self-pay | Admitting: Family

## 2016-08-06 ENCOUNTER — Ambulatory Visit (HOSPITAL_BASED_OUTPATIENT_CLINIC_OR_DEPARTMENT_OTHER): Payer: Commercial Managed Care - HMO

## 2016-08-06 VITALS — BP 124/56 | HR 72 | Temp 98.0°F | Resp 20

## 2016-08-06 DIAGNOSIS — D508 Other iron deficiency anemias: Secondary | ICD-10-CM

## 2016-08-06 MED ORDER — SODIUM CHLORIDE 0.9 % IV SOLN
510.0000 mg | Freq: Once | INTRAVENOUS | Status: AC
  Administered 2016-08-06: 510 mg via INTRAVENOUS
  Filled 2016-08-06: qty 17

## 2016-08-06 MED ORDER — SODIUM CHLORIDE 0.9 % IV SOLN
Freq: Once | INTRAVENOUS | Status: AC
  Administered 2016-08-06: 09:00:00 via INTRAVENOUS

## 2016-08-06 NOTE — Patient Instructions (Signed)

## 2016-08-18 ENCOUNTER — Other Ambulatory Visit: Payer: Self-pay | Admitting: Family

## 2016-08-20 ENCOUNTER — Ambulatory Visit (INDEPENDENT_AMBULATORY_CARE_PROVIDER_SITE_OTHER): Payer: Commercial Managed Care - HMO | Admitting: Orthopedic Surgery

## 2016-08-20 ENCOUNTER — Encounter (INDEPENDENT_AMBULATORY_CARE_PROVIDER_SITE_OTHER): Payer: Self-pay | Admitting: Orthopedic Surgery

## 2016-08-20 DIAGNOSIS — M7541 Impingement syndrome of right shoulder: Secondary | ICD-10-CM | POA: Diagnosis not present

## 2016-08-20 DIAGNOSIS — M25511 Pain in right shoulder: Secondary | ICD-10-CM | POA: Diagnosis not present

## 2016-08-20 DIAGNOSIS — M19019 Primary osteoarthritis, unspecified shoulder: Secondary | ICD-10-CM | POA: Diagnosis not present

## 2016-08-20 MED ORDER — DICLOFENAC SODIUM 2 % TD SOLN: 2.0000 | Freq: Two times a day (BID) | TRANSDERMAL | 1 refills | Status: DC

## 2016-08-20 MED ORDER — BUPIVACAINE HCL 0.5 % IJ SOLN
9.0000 mL | INTRAMUSCULAR | Status: AC | PRN
  Administered 2016-08-20: 9 mL via INTRA_ARTICULAR

## 2016-08-20 MED ORDER — TRIAMCINOLONE ACETONIDE 40 MG/ML IJ SUSP
20.0000 mg | INTRAMUSCULAR | Status: AC | PRN
  Administered 2016-08-20: 20 mg via INTRA_ARTICULAR

## 2016-08-20 MED ORDER — BUPIVACAINE HCL 0.25 % IJ SOLN
0.6600 mL | INTRAMUSCULAR | Status: AC | PRN
  Administered 2016-08-20: .66 mL via INTRA_ARTICULAR

## 2016-08-20 MED ORDER — LIDOCAINE HCL 1 % IJ SOLN
3.0000 mL | INTRAMUSCULAR | Status: AC | PRN
  Administered 2016-08-20: 3 mL

## 2016-08-20 MED ORDER — LIDOCAINE HCL 1 % IJ SOLN
5.0000 mL | INTRAMUSCULAR | Status: AC | PRN
  Administered 2016-08-20: 5 mL

## 2016-08-20 MED ORDER — METHYLPREDNISOLONE ACETATE 40 MG/ML IJ SUSP
40.0000 mg | INTRAMUSCULAR | Status: AC | PRN
  Administered 2016-08-20: 40 mg via INTRA_ARTICULAR

## 2016-08-20 NOTE — Progress Notes (Signed)
Office Visit Note   Patient: [name]           Date of Birth: 09/09/1955           MRN: [id number] Visit Date: 08/20/2016 Requested by: Debbrah Alar, NP [place], [place] [address] PCP: Nance Pear., NP  Subjective: Chief Complaint  Patient presents with  . Right Shoulder - Follow-up    HPI: [name] is a 61 year old patient with right shoulder pain.  She had acromioclavicular joint and subacromial space injection to 1218 6 weeks ago.  That helped her significantly.  The pain is not nearly as bad according to [name].  Occasionally she will develop pain if she picks things up the wrong way.  Currently not taking any medication for the problem.  Topical also helped.  She's not having any mechanical symptoms or weakness.              ROS: All systems reviewed are negative as they relate to the chief complaint within the history of present illness.  Patient denies  fevers or chills.   Assessment & Plan: Visit Diagnoses:  1. Right shoulder pain, unspecified chronicity   2. Impingement syndrome of right shoulder   3. Acromioclavicular joint arthritis     Plan: Impression is right shoulder pain with both a component of impingement as well as acromioclavicular joint arthritis.  Both these areas are reinjected today.  I also want to send her to physical therapy for below shoulder level and rotator cuff strengthening.  Refill topical anti-inflammatory which has also helped her.  Next step would be MRI scanning and she'll call if her symptoms recur.  We would obtain further imaging which should be an MRI arthrogram and then follow up with her after that should her symptoms recur.  Follow-Up Instructions: Return if symptoms worsen or fail to improve.   Orders:  No orders of the defined types were placed in this encounter.  Meds ordered this encounter  Medications  . Diclofenac Sodium (PENNSAID) 2 % SOLN    Sig: Place 2 Squirts onto the skin 2  (two) times daily.    Dispense:  1 Bottle    Refill:  1      Procedures: Large Joint Inj Date/Time: 08/20/2016 11:14 AM Performed by: Meredith Pel Authorized by: Meredith Pel   Consent Given by:  Patient Site marked: the procedure site was marked   Timeout: prior to procedure the correct patient, procedure, and site was verified   Indications:  Pain and diagnostic evaluation Location:  Shoulder Site:  R subacromial bursa Prep: patient was prepped and draped in usual sterile fashion   Needle Size:  18 G Needle Length:  1.5 inches Approach:  Posterior Ultrasound Guidance: No   Fluoroscopic Guidance: No   Arthrogram: No   Medications:  5 mL lidocaine 1 %; 9 mL bupivacaine 0.5 %; 40 mg methylPREDNISolone acetate 40 MG/ML Aspiration Attempted: No   Patient tolerance:  Patient tolerated the procedure well with no immediate complications Medium Joint Inj Date/Time: 08/20/2016 11:14 AM Performed by: Meredith Pel Authorized by: Meredith Pel   Consent Given by:  Patient Site marked: the procedure site was marked   Timeout: prior to procedure the correct patient, procedure, and site was verified   Indications:  Pain and diagnostic evaluation Location:  Shoulder Site:  R acromioclavicular Prep: patient was prepped and draped in usual sterile fashion   Needle Size:  25 G Needle  Length:  1.5 inches Approach:  Superior Ultrasound Guided: Yes   Fluoroscopic Guidance: No   Medications:  3 mL lidocaine 1 %; 20 mg triamcinolone acetonide 40 MG/ML; 0.66 mL bupivacaine 0.25 % Aspiration Attempted: No   Patient tolerance:  Patient tolerated the procedure well with no immediate complications      Clinical Data: No additional findings.  Objective: Vital Signs: LMP 06/11/2012   Physical Exam:   Constitutional: Patient appears well-developed HEENT:  Head: Normocephalic Eyes:EOM are normal Neck: Normal range of motion Cardiovascular: Normal  rate Pulmonary/chest: Effort normal Neurologic: Patient is alert Skin: Skin is warm Psychiatric: Patient has normal mood and affect    Ortho Exam: Orthopedic exam demonstrates normal gait and alignment.  Cervical spine range of motion is good.  The patient has residual mild tenderness at the acromioclavicular joint on the right not the left.  Rotator cuff strength is intact to isolated and status super status subscap testing on the right-hand side.  Not much in way of course grinding or crepitus with active or passive range of motion of the shoulder.  No other masses lymph adenopathy or skin changes noted in the shoulder girdle region.  Impingement signs positive.  Crossarm adduction also painful.  She only has a small amount of popping which feels like it could be acromioclavicular joint crepitus or chondral surface irregularity in the glenohumeral joint and that's only noted with abduction and about 90 with internal/external rotation.  Specialty Comments:  No specialty comments available.  Imaging: No results found.   PMFS History: Patient Active Problem List   Diagnosis Date Noted  . Ureteral calculus, right 07/17/2014  . Osteomyelitis ([place]) 06/14/2014  . Pernicious anemia 02/20/2014  . Reflux esophagitis 02/02/2014  . Edema 11/23/2013  . Foot pain 03/31/2013  . Endometrial adenocarcinoma ([place]) 02/21/2013  . Trochanteric bursitis of left hip 09/13/2012  . Fatty liver 01/07/2012  . Mid back pain 09/30/2011  . Weight gain 07/02/2011  . Nonspecific abnormal unspecified cardiovascular function study 04/30/2011  . Hx of papillary thyroid carcinoma 04/07/2011  . Low back pain 04/01/2011  . Atrial fibrillation ([place]) 02/05/2011  . Hypoparathyroidism ([place]) 01/07/2011  . GERD 05/22/2009  . LEUKOCYTOSIS 03/24/2009  . OBESITY, MORBID 12/20/2008  . RHINOSINUSITIS, RECURRENT 09/07/2008  . Vitamin D deficiency 05/10/2007  . Iron deficiency anemia secondary to malabsorption  05/10/2007   . HYPOTHYROIDISM 01/05/2007  . Essential hypertension 01/05/2007  . ALLERGIC RHINITIS 01/05/2007  . Asthma 01/05/2007   Past Medical History:  Diagnosis Date  . Abnormal Pap smear    years ago/no biopsy  . Allergic rhinitis   . Anemia, iron deficiency   . Arthritis    back- lower  . Asthma   . Atrial fibrillation ([place]) August 2012   OFF XARELTO LAST MONTH DUE TO BLEEDING IN STOOL  . Bursitis of hip left  . Dysrhythmia    afib, followed by Dr. Stanford Breed   . Edema of both legs   . Fatty liver 01/07/2012  . Fibroid 1974   fibroid cyst on left fallopian tube  . Fibromyalgia   . Foot ulcer ([place])    AREA HEALED RIGHT FOOT  . GERD (gastroesophageal reflux disease)   . Headache(784.0)    occasional sinus headache   . History of blood transfusion JULY 2015  . Hypertension   . Hypoparathyroidism ([place]) 01/07/2011  . Hypothyroidism   . Kidney stone 10-21-15   passed on their own  . Morbid obesity ([place])   . Nephrolithiasis 2/16, 9/16  SEES DR Risa Grill  . Pernicious anemia 02/20/2014   followed by Debbrah Alar  . PONV (postoperative nausea and vomiting)   . Seizures ([place])    infancy secondary to fever  . Thyroid cancer ([place]) 2011   THYROIDECTOMY DONE  . Uterine cancer ([place]) 2014   Mirena IUD  . Vitamin D deficiency     Family History  Problem Relation Age of Onset  . Hypertension Father   . Diabetes Father   . Lung cancer Father   . Heart attack Father     MI at age 36  . Hypertension Mother   . Hyperthyroidism Mother   . Heart disease Mother   . Heart attack Mother     MI at age 84  . Asthma Brother   . Hypertension Brother     younger  . Heart disease Brother     older  . Colon cancer Neg Hx   . Esophageal cancer Neg Hx   . Stomach cancer Neg Hx   . Kidney disease Neg Hx   . Liver disease Neg Hx     Past Surgical History:  Procedure Laterality Date  . AMPUTATION Right 05/18/2014   Procedure: RIGHT FIFTH RAY AMPUTATION FOOT;  Surgeon: Wylene Simmer, MD;   Location: [facility];  Service: Orthopedics;  Laterality: Right;  . APPENDECTOMY    . BUNIONECTOMY     bilateral  . COLONOSCOPY WITH PROPOFOL N/A 02/02/2014   Procedure: COLONOSCOPY WITH PROPOFOL;  Surgeon: Irene Shipper, MD;  [facility];  Service: Endoscopy;  Laterality: N/A;  . cyst on ovary removed     . CYSTOSCOPY W/ RETROGRADES  03/03/2012   Procedure: CYSTOSCOPY WITH RETROGRADE PYELOGRAM;  Surgeon: Bernestine Amass, MD;  [facility];  Service: Urology;  Laterality: Bilateral;  . CYSTOSCOPY WITH RETROGRADE PYELOGRAM, URETEROSCOPY AND STENT PLACEMENT Left 08/25/2012   Procedure: CYSTOSCOPY WITH RETROGRADE PYELOGRAM, URETEROSCOPY;  Surgeon: Bernestine Amass, MD;  [facility];  Service: Urology;  Laterality: Left;  . DILATION AND CURETTAGE OF UTERUS N/A 02/21/2013   Procedure: DILATATION AND CURETTAGE;  Surgeon: Lyman Speller, MD;  Location: [facility];  Service: Gynecology;  Laterality: N/A;  . DILATION AND CURETTAGE OF UTERUS N/A 04/09/2015   Procedure: [place] IUD removal;  Surgeon: Megan Salon, MD;  Location: [facility];  Service: Gynecology;  Laterality: N/A;  Patient weight 307lbs  . ESOPHAGOGASTRODUODENOSCOPY (EGD) WITH PROPOFOL N/A 02/02/2014   Procedure: ESOPHAGOGASTRODUODENOSCOPY (EGD) WITH PROPOFOL;  Surgeon: Irene Shipper, MD;  [facility];  Service: Endoscopy;  Laterality: N/A;  . GASTRIC BYPASS  1974  . HOLMIUM LASER APPLICATION Left 10/30/8936   Procedure: HOLMIUM LASER APPLICATION;  Surgeon: Bernestine Amass, MD;  [facility];  Service: Urology;  Laterality: Left;  . LITHOTRIPSY  03/2012  . LITHOTRIPSY  2/16  . THYROIDECTOMY  05/15/2010  . TONSILLECTOMY AND ADENOIDECTOMY    . URETEROSCOPY  03/03/2012   Procedure: URETEROSCOPY;  Surgeon: Bernestine Amass, MD;  [facility];  Service: Urology;  Laterality: Left;   Social History   Occupational History  . works in Insurance claims handler   . [employer]   Social  History Main Topics  . Smoking status: Former Smoker    Packs/day: 0.50    Years: 25.00    Types: Cigarettes    Start date: 12/24/1970    Quit date: 05/27/1995  . Smokeless tobacco: Never Used     Comment: quit smoking 19  years ago  . Alcohol use 0.0 oz/week     Comment: 1/2 glass per month  . Drug use: No  . Sexual activity: Yes    Partners: Male    Birth control/ protection: Post-menopausal

## 2016-08-21 ENCOUNTER — Ambulatory Visit (INDEPENDENT_AMBULATORY_CARE_PROVIDER_SITE_OTHER): Payer: Commercial Managed Care - HMO

## 2016-08-21 DIAGNOSIS — E538 Deficiency of other specified B group vitamins: Secondary | ICD-10-CM

## 2016-08-21 MED ORDER — CYANOCOBALAMIN 1000 MCG/ML IJ SOLN
1000.0000 ug | Freq: Once | INTRAMUSCULAR | Status: AC
  Administered 2016-08-21: 1000 ug via INTRAMUSCULAR

## 2016-08-21 NOTE — Progress Notes (Signed)
Pre visit review using our clinic tool,if applicable. No additional management support is needed unless otherwise documented below in the visit note.   Patient in for B12 injection per order from Earlie Counts, NP due to patient having a B12 deficiency.  No complaints voiced today from patient. B12 1000 mg given IM left deltoid. Patient tolerated well.   Return appointment given for 1 month. [name]

## 2016-08-22 ENCOUNTER — Other Ambulatory Visit: Payer: Self-pay | Admitting: Family

## 2016-08-29 ENCOUNTER — Other Ambulatory Visit: Payer: Self-pay | Admitting: Family

## 2016-09-02 ENCOUNTER — Telehealth: Payer: Self-pay | Admitting: *Deleted

## 2016-09-02 ENCOUNTER — Other Ambulatory Visit: Payer: Self-pay | Admitting: Family

## 2016-09-02 MED ORDER — TRAMADOL HCL 50 MG PO TABS: ORAL_TABLET | ORAL | 0 refills | Status: DC

## 2016-09-02 NOTE — Telephone Encounter (Signed)
[name]--please advise vitamin D request? Last Vit d level I could see was 02/2016.

## 2016-09-02 NOTE — Telephone Encounter (Signed)
Received fax from [facility] requesting tramadol refill.  Last tramadol RX: 06/20/16, #30 Last OV: 06/24/16 Next OV: none scheduled UDS: 06/19/16.  Rx printed and forwarded to PCP for signature.

## 2016-09-03 NOTE — Telephone Encounter (Signed)
Rx faxed to pharmacy  

## 2016-09-19 ENCOUNTER — Ambulatory Visit (INDEPENDENT_AMBULATORY_CARE_PROVIDER_SITE_OTHER): Payer: Commercial Managed Care - HMO

## 2016-09-19 DIAGNOSIS — E538 Deficiency of other specified B group vitamins: Secondary | ICD-10-CM

## 2016-09-19 MED ORDER — CYANOCOBALAMIN 1000 MCG/ML IJ SOLN
1000.0000 ug | Freq: Once | INTRAMUSCULAR | Status: AC
  Administered 2016-09-19: 1000 ug via INTRAMUSCULAR

## 2016-09-19 NOTE — Progress Notes (Signed)
Noted  

## 2016-09-19 NOTE — Progress Notes (Signed)
Pre visit review using our clinic tool,if applicable. No additional management support is needed unless otherwise documented below in the visit note.   Patient in for B12 injection per order from Debbrah Alar, NP.  B12 1000 mcg given IM Left deltoid. Patient tolerated well. No complaints voiced.  Return appointment given for 1 month.

## 2016-09-24 ENCOUNTER — Ambulatory Visit (INDEPENDENT_AMBULATORY_CARE_PROVIDER_SITE_OTHER): Payer: Commercial Managed Care - HMO | Admitting: Orthopedic Surgery

## 2016-09-24 ENCOUNTER — Encounter (INDEPENDENT_AMBULATORY_CARE_PROVIDER_SITE_OTHER): Payer: Self-pay | Admitting: Orthopedic Surgery

## 2016-09-24 DIAGNOSIS — M25511 Pain in right shoulder: Secondary | ICD-10-CM

## 2016-09-24 NOTE — Progress Notes (Signed)
Office Visit Note   Patient: [name]           Date of Birth: 1955/09/02           MRN: [id number] Visit Date: 09/24/2016 Requested by: Debbrah Alar, NP [place], [place] [address] PCP: Nance Pear., NP  Subjective: Chief Complaint  Patient presents with  . Right Shoulder - Follow-up    HPI: [name] is a 61 year old female with right shoulder pain.  She had her second dual subacromial and acromioclavicular joint injection at the end of March.  States she did not have as much relief with this series of injections as she did with the initial injection.  She feels an occasional pop with reaching.  She states she has good and bad days.  She wants to go to physical therapy.  She has a physical therapy prescription.  She's taking over-the-counter pain medication.              ROS: Thoracic disc may continue  Assessment & Plan: Visit Diagnoses:  1. Right shoulder pain, unspecified chronicity     Plan: Impression is probable right rotator cuff tear which may be small.  Plan is physical therapy for 6 weeks to see if it can improve on its own if not MRI scanning to clearly delineate the size of the tear is indicated.  Her acromioclavicular joint is less tender following the injection and so we may progress on that front but her rotator cuff pathology likely remains and is potentially symptomatic enough for intervention.  Follow-Up Instructions: No Follow-up on file.   Orders:  No orders of the defined types were placed in this encounter.  No orders of the defined types were placed in this encounter.     Procedures: No procedures performed   Clinical Data: No additional findings.  Objective: Vital Signs: LMP 06/11/2012   Physical Exam:   Constitutional: Patient appears well-developed HEENT:  Head: Normocephalic Eyes:EOM are normal Neck: Normal range of motion Cardiovascular: Normal rate Pulmonary/chest: Effort  normal Neurologic: Patient is alert Skin: Skin is warm Psychiatric: Patient has normal mood and affect    Ortho Exam: Orthopedic exam demonstrates some coarseness and grinding with passive range of motion of the right shoulder.  Cervical spine range of motion is full.  Radial pulses intact bilaterally.  No masses lymph adenopathy or skin changes noted in the right shoulder girdle region.  Rotator cuff strength is generally intact but slightly weaker on the right than the left to supraspinatus testing only.  There is no restriction of passive motion right versus left shoulder.  Decreased tenderness to palpation of the acromioclavicular joint today versus last clinic visit  Specialty Comments:  No specialty comments available.  Imaging: No results found.   PMFS History: Patient Active Problem List   Diagnosis Date Noted  . Ureteral calculus, right 07/17/2014  . Osteomyelitis ([place]) 06/14/2014  . Pernicious anemia 02/20/2014  . Reflux esophagitis 02/02/2014  . Edema 11/23/2013  . Foot pain 03/31/2013  . Endometrial adenocarcinoma ([place]) 02/21/2013  . Trochanteric bursitis of left hip 09/13/2012  . Fatty liver 01/07/2012  . Mid back pain 09/30/2011  . Weight gain 07/02/2011  . Nonspecific abnormal unspecified cardiovascular function study 04/30/2011  . Hx of papillary thyroid carcinoma 04/07/2011  . Low back pain 04/01/2011  . Atrial fibrillation ([place]) 02/05/2011  . Hypoparathyroidism ([place]) 01/07/2011  . GERD 05/22/2009  . LEUKOCYTOSIS 03/24/2009  . OBESITY, MORBID 12/20/2008  . RHINOSINUSITIS,  RECURRENT 09/07/2008  . Vitamin D deficiency 05/10/2007  . Iron deficiency anemia secondary to malabsorption  05/10/2007  . HYPOTHYROIDISM 01/05/2007  . Essential hypertension 01/05/2007  . ALLERGIC RHINITIS 01/05/2007  . Asthma 01/05/2007   Past Medical History:  Diagnosis Date  . Abnormal Pap smear    years ago/no biopsy  . Allergic rhinitis   . Anemia, iron deficiency   .  Arthritis    back- lower  . Asthma   . Atrial fibrillation ([place]) August 2012   OFF XARELTO LAST MONTH DUE TO BLEEDING IN STOOL  . Bursitis of hip left  . Dysrhythmia    afib, followed by Dr. Stanford Breed   . Edema of both legs   . Fatty liver 01/07/2012  . Fibroid 1974   fibroid cyst on left fallopian tube  . Fibromyalgia   . Foot ulcer ([place])    AREA HEALED RIGHT FOOT  . GERD (gastroesophageal reflux disease)   . Headache(784.0)    occasional sinus headache   . History of blood transfusion JULY 2015  . Hypertension   . Hypoparathyroidism ([place]) 01/07/2011  . Hypothyroidism   . Kidney stone 10/06/15   passed on their own  . Morbid obesity ([place])   . Nephrolithiasis 2/16, 9/16   SEES DR Risa Grill  . Pernicious anemia 02/20/2014   followed by Debbrah Alar  . PONV (postoperative nausea and vomiting)   . Seizures ([place])    infancy secondary to fever  . Thyroid cancer ([place]) 2011   THYROIDECTOMY DONE  . Uterine cancer ([place]) 2014   Mirena IUD  . Vitamin D deficiency     Family History  Problem Relation Age of Onset  . Hypertension Father   . Diabetes Father   . Lung cancer Father   . Heart attack Father     MI at age 79  . Hypertension Mother   . Hyperthyroidism Mother   . Heart disease Mother   . Heart attack Mother     MI at age 7  . Asthma Brother   . Hypertension Brother     younger  . Heart disease Brother     older  . Colon cancer Neg Hx   . Esophageal cancer Neg Hx   . Stomach cancer Neg Hx   . Kidney disease Neg Hx   . Liver disease Neg Hx     Past Surgical History:  Procedure Laterality Date  . AMPUTATION Right 05/18/2014   Procedure: RIGHT FIFTH RAY AMPUTATION FOOT;  Surgeon: Wylene Simmer, MD;  Location: [facility];  Service: Orthopedics;  Laterality: Right;  . APPENDECTOMY    . BUNIONECTOMY     bilateral  . COLONOSCOPY WITH PROPOFOL N/A 02/02/2014   Procedure: COLONOSCOPY WITH PROPOFOL;  Surgeon: Irene Shipper, MD;  [facility];  Service: Endoscopy;   Laterality: N/A;  . cyst on ovary removed     . CYSTOSCOPY W/ RETROGRADES  03/03/2012   Procedure: CYSTOSCOPY WITH RETROGRADE PYELOGRAM;  Surgeon: Bernestine Amass, MD;  [facility];  Service: Urology;  Laterality: Bilateral;  . CYSTOSCOPY WITH RETROGRADE PYELOGRAM, URETEROSCOPY AND STENT PLACEMENT Left 08/25/2012   Procedure: CYSTOSCOPY WITH RETROGRADE PYELOGRAM, URETEROSCOPY;  Surgeon: Bernestine Amass, MD;  [facility];  Service: Urology;  Laterality: Left;  . DILATION AND CURETTAGE OF UTERUS N/A 02/21/2013   Procedure: DILATATION AND CURETTAGE;  Surgeon: Lyman Speller, MD;  Location: [facility];  Service: Gynecology;  Laterality: N/A;  . DILATION AND CURETTAGE OF UTERUS N/A 04/09/2015  Procedure: DILATATION AND CURETTAGEand IUD removal;  Surgeon: Megan Salon, MD;  Location: [facility];  Service: Gynecology;  Laterality: N/A;  Patient weight 307lbs  . ESOPHAGOGASTRODUODENOSCOPY (EGD) WITH PROPOFOL N/A 02/02/2014   Procedure: ESOPHAGOGASTRODUODENOSCOPY (EGD) WITH PROPOFOL;  Surgeon: Irene Shipper, MD;  [facility];  Service: Endoscopy;  Laterality: N/A;  . GASTRIC BYPASS  1974  . HOLMIUM LASER APPLICATION Left 01/26/8100   Procedure: HOLMIUM LASER APPLICATION;  Surgeon: Bernestine Amass, MD;  [facility];  Service: Urology;  Laterality: Left;  . LITHOTRIPSY  03/2012  . LITHOTRIPSY  2/16  . THYROIDECTOMY  05/15/2010  . TONSILLECTOMY AND ADENOIDECTOMY    . URETEROSCOPY  03/03/2012   Procedure: URETEROSCOPY;  Surgeon: Bernestine Amass, MD;  [facility];  Service: Urology;  Laterality: Left;   Social History   Occupational History  . works in Insurance claims handler   . 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 Smoking status: Former Smoker    Packs/day: 0.50    Years: 25.00    Types: Cigarettes    Start date: 12/24/1970    Quit date: 05/27/1995  . Smokeless tobacco: Never Used     Comment: quit smoking 19 years ago  . Alcohol use 0.0 oz/week      Comment: 1/2 glass per month  . Drug use: No  . Sexual activity: Yes    Partners: Male    Birth control/ protection: Post-menopausal

## 2016-09-30 DIAGNOSIS — M25511 Pain in right shoulder: Secondary | ICD-10-CM | POA: Diagnosis not present

## 2016-10-01 DIAGNOSIS — Z85850 Personal history of malignant neoplasm of thyroid: Secondary | ICD-10-CM | POA: Diagnosis not present

## 2016-10-01 DIAGNOSIS — E038 Other specified hypothyroidism: Secondary | ICD-10-CM | POA: Diagnosis not present

## 2016-10-01 DIAGNOSIS — E892 Postprocedural hypoparathyroidism: Secondary | ICD-10-CM | POA: Diagnosis not present

## 2016-10-06 DIAGNOSIS — M25511 Pain in right shoulder: Secondary | ICD-10-CM | POA: Diagnosis not present

## 2016-10-13 DIAGNOSIS — M25511 Pain in right shoulder: Secondary | ICD-10-CM | POA: Diagnosis not present

## 2016-10-17 ENCOUNTER — Ambulatory Visit (INDEPENDENT_AMBULATORY_CARE_PROVIDER_SITE_OTHER): Payer: Commercial Managed Care - HMO

## 2016-10-17 DIAGNOSIS — E538 Deficiency of other specified B group vitamins: Secondary | ICD-10-CM

## 2016-10-17 MED ORDER — CYANOCOBALAMIN 1000 MCG/ML IJ SOLN
1000.0000 ug | Freq: Once | INTRAMUSCULAR | Status: AC
  Administered 2016-10-17: 1000 ug via INTRAMUSCULAR

## 2016-10-17 NOTE — Progress Notes (Signed)
Patient in for B12 injection due to B12 deficiency per order from Debbrah Alar, NP.  Given 1000 mcg  Im Right deltoid. Patient tolerated well. Return appointment given for 1 month. Patient aware.

## 2016-10-17 NOTE — Progress Notes (Signed)
Noted  

## 2016-10-22 DIAGNOSIS — M25511 Pain in right shoulder: Secondary | ICD-10-CM | POA: Diagnosis not present

## 2016-10-29 ENCOUNTER — Other Ambulatory Visit: Payer: Self-pay | Admitting: Family

## 2016-10-29 NOTE — Telephone Encounter (Signed)
Refill sent per [facility] refill protocol/SLS  

## 2016-11-03 DIAGNOSIS — M25511 Pain in right shoulder: Secondary | ICD-10-CM | POA: Diagnosis not present

## 2016-11-04 ENCOUNTER — Encounter: Payer: Self-pay | Admitting: Family

## 2016-11-04 NOTE — Telephone Encounter (Signed)
[name]-- it looks like pt has used albuterol inhaler in the past, not on current med list. Please advise?

## 2016-11-05 MED ORDER — ALBUTEROL SULFATE HFA 108 (90 BASE) MCG/ACT IN AERS: 2.0000 | INHALATION_SPRAY | Freq: Four times a day (QID) | RESPIRATORY_TRACT | 5 refills | Status: DC | PRN

## 2016-11-07 ENCOUNTER — Other Ambulatory Visit: Payer: Self-pay | Admitting: Family

## 2016-11-10 ENCOUNTER — Ambulatory Visit: Payer: Commercial Managed Care - HMO | Admitting: Obstetrics & Gynecology

## 2016-11-10 ENCOUNTER — Telehealth: Payer: Self-pay | Admitting: Family

## 2016-11-10 DIAGNOSIS — M25511 Pain in right shoulder: Secondary | ICD-10-CM | POA: Diagnosis not present

## 2016-11-10 MED ORDER — ALBUTEROL SULFATE HFA 108 (90 BASE) MCG/ACT IN AERS: 2.0000 | INHALATION_SPRAY | Freq: Four times a day (QID) | RESPIRATORY_TRACT | 5 refills | Status: DC | PRN

## 2016-11-10 NOTE — Telephone Encounter (Signed)
Rx resent to [facility], apologized to pt for the mix up.

## 2016-11-10 NOTE — Telephone Encounter (Signed)
Caller name: Relationship to patient: Self Can be reached: [phone]  Pharmacy:  [facility], [place] - [address] AT [address] [phone] (Phone) [phone] (Fax)     Reason for call: Patient states her Rx was sent to the wrong pharmacy. Please send Rx for albuterol (PROVENTIL HFA;VENTOLIN HFA) 108 (90 Base) MCG/ACT inhaler to  [id number] - [place], [place] - [address] AT [address] [phone] ([phone] (Fax)

## 2016-11-10 NOTE — Progress Notes (Deleted)
61 y.o. G0P0000 SingleCaucasianF here for annual exam.    Patient's last menstrual period was 06/11/2012.          Sexually active: {yes no:314532}  The current method of family planning is post menopausal status.    Exercising: {yes no:314532}  {types:19826} Smoker:  {YES [id number]  Health Maintenance: Pap:  05/05/16 Neg   10/30/15 Neg  History of abnormal Pap:  Yes, h/o endometrial cancer  MMG:  2011  Colonoscopy:  02/02/14 incomplete. Polyps  BMD:   *** TDaP:  01/2013  Pneumonia vaccine(s):  02/2016  Zostavax:   *** Hep C testing: 10/30/15 Neg  Screening Labs: ***, Hb today: ***, Urine today: ***   reports that she quit smoking about 21 years ago. Her smoking use included Cigarettes. She started smoking about 45 years ago. She has a 12.50 pack-year smoking history. She has never used smokeless tobacco. She reports that she drinks alcohol. She reports that she does not use drugs.  Past Medical History:  Diagnosis Date  . Abnormal Pap smear    years ago/no biopsy  . Allergic rhinitis   . Anemia, iron deficiency   . Arthritis    back- lower  . Asthma   . Atrial fibrillation ([place]) August 2012   OFF XARELTO LAST MONTH DUE TO BLEEDING IN STOOL  . Bursitis of hip left  . Dysrhythmia    afib, followed by Dr. Stanford Breed   . Edema of both legs   . Fatty liver 01/07/2012  . Fibroid 1974   fibroid cyst on left fallopian tube  . Fibromyalgia   . Foot ulcer ([place])    AREA HEALED RIGHT FOOT  . GERD (gastroesophageal reflux disease)   . Headache(784.0)    occasional sinus headache   . History of blood transfusion JULY 2015  . Hypertension   . Hypoparathyroidism ([place]) 01/07/2011  . Hypothyroidism   . Kidney stone Oct 11, 2015   passed on their own  . Morbid obesity ([place])   . Nephrolithiasis 2/16, 9/16   SEES DR Risa Grill  . Pernicious anemia 02/20/2014   followed by Debbrah Alar  . PONV (postoperative nausea and vomiting)   . Seizures ([place])    infancy secondary to fever  . Thyroid  cancer ([place]) 2011   THYROIDECTOMY DONE  . Uterine cancer ([place]) 2014   Mirena IUD  . Vitamin D deficiency     Past Surgical History:  Procedure Laterality Date  . AMPUTATION Right 05/18/2014   Procedure: RIGHT FIFTH RAY AMPUTATION FOOT;  Surgeon: Wylene Simmer, MD;  Location: [facility];  Service: Orthopedics;  Laterality: Right;  . APPENDECTOMY    . BUNIONECTOMY     bilateral  . COLONOSCOPY WITH PROPOFOL N/A 02/02/2014   Procedure: COLONOSCOPY WITH PROPOFOL;  Surgeon: Irene Shipper, MD;  Location: [facility];  Service: Endoscopy;  Laterality: N/A;  . cyst on ovary removed     . CYSTOSCOPY W/ RETROGRADES  03/03/2012   Procedure: CYSTOSCOPY WITH RETROGRADE PYELOGRAM;  Surgeon: Bernestine Amass, MD;  Location: [facility];  Service: Urology;  Laterality: Bilateral;  . CYSTOSCOPY WITH RETROGRADE PYELOGRAM, URETEROSCOPY AND STENT PLACEMENT Left 08/25/2012   Procedure: CYSTOSCOPY WITH RETROGRADE PYELOGRAM, URETEROSCOPY;  Surgeon: Bernestine Amass, MD;  Location: [facility];  Service: Urology;  Laterality: Left;  . DILATION AND CURETTAGE OF UTERUS N/A 02/21/2013   Procedure: DILATATION AND CURETTAGE;  Surgeon: Lyman Speller, MD;  Location: [facility];  Service: Gynecology;  Laterality: N/A;  . DILATION AND CURETTAGE OF UTERUS N/A  04/09/2015   Procedure: DILATATION AND CURETTAGEand IUD removal;  Surgeon: Megan Salon, MD;  Location: [facility];  Service: Gynecology;  Laterality: N/A;  Patient weight 307lbs  . ESOPHAGOGASTRODUODENOSCOPY (EGD) WITH PROPOFOL N/A 02/02/2014   Procedure: ESOPHAGOGASTRODUODENOSCOPY (EGD) WITH PROPOFOL;  Surgeon: Irene Shipper, MD;  [facility]: [facility];  Service: Endoscopy;  Laterality: N/A;  . GASTRIC BYPASS  1974  . HOLMIUM LASER APPLICATION Left 1/0/2725   Procedure: HOLMIUM LASER APPLICATION;  Surgeon: Bernestine Amass, MD;  Location: [facility];  Service: Urology;  Laterality: Left;  . LITHOTRIPSY  03/2012  . LITHOTRIPSY  2/16  . THYROIDECTOMY  05/15/2010  . TONSILLECTOMY AND ADENOIDECTOMY     . URETEROSCOPY  03/03/2012   Procedure: URETEROSCOPY;  Surgeon: Bernestine Amass, MD;  Location: [facility];  Service: Urology;  Laterality: Left;    Current Outpatient Prescriptions  Medication Sig Dispense Refill  . albuterol (PROVENTIL HFA;VENTOLIN HFA) 108 (90 Base) MCG/ACT inhaler Inhale 2 puffs into the lungs every 6 (six) hours as needed for wheezing or shortness of breath. 1 Inhaler 5  . albuterol (PROVENTIL) (2.5 MG/3ML) 0.083% nebulizer solution INHALE 1 VIAL VIA NEBULIZER EVERY 4 HOURS AS NEEDED WHEEZING 75 mL 0  . apixaban (ELIQUIS) 5 MG TABS tablet Take 1 tablet (5 mg total) by mouth 2 (two) times daily. 180 tablet 2  . calcitRIOL (ROCALTROL) 0.5 MCG capsule Take 1.5 mcg by mouth daily.     . Cyanocobalamin (VITAMIN B-12 IJ) Inject as directed every 30 (thirty) days.    . cyclobenzaprine (FLEXERIL) 10 MG tablet TAKE 1 TABLET BY MOUTH  EVERY NIGHT AT BEDTIME AS  NEEDED 30 tablet 5  . desoximetasone (TOPICORT) 0.05 % cream Apply topically 2 (two) times daily. 30 g 0  . Diclofenac Sodium (PENNSAID) 2 % SOLN Place 2 Squirts onto the skin 2 (two) times daily. 1 Bottle 1  . diltiazem (CARDIZEM CD) 240 MG 24 hr capsule TAKE 1 CAPSULE BY MOUTH  DAILY 90 capsule 1  . DIPHENHYDRAMINE-PSEUDOEPHED PO Take 1 tablet by mouth every 6 (six) hours as needed (for allegies).    . fluticasone (FLONASE) 50 MCG/ACT nasal spray Place 2 sprays into both nostrils daily as needed for allergies or rhinitis.     . furosemide (LASIX) 40 MG tablet TAKE 1 TABLET BY MOUTH  DAILY 90 tablet 1  . hydrochlorothiazide (HYDRODIURIL) 25 MG tablet TAKE 1 TABLET BY MOUTH  DAILY 90 tablet 0  . levocetirizine (XYZAL) 5 MG tablet TAKE 1 TABLET BY MOUTH  EVERY EVENING 90 tablet 1  . levothyroxine (SYNTHROID, LEVOTHROID) 100 MCG tablet Take 3 tablets by mouth daily.    [name] MANGANESE PO Take 1,250 mg by mouth 2 (two) times daily.    . methocarbamol (ROBAXIN) 500 MG tablet TAKE 1 TABLET BY MOUTH AT  7AM AND AT 2PM AS NEEDED 60 tablet  5  . nystatin cream (MYCOSTATIN) APPLY TO THE AFFECTED AREA TWICE DAILY FOR UP TO 7 DAYS 30 g 0  . omeprazole (PRILOSEC) 40 MG capsule TAKE 1 CAPSULE BY MOUTH  DAILY 90 capsule 1  . SYMBICORT 160-4.5 MCG/ACT inhaler USE 2 PUFFS TWO TIMES DAILY 30.6 g 0  . traMADol (ULTRAM) 50 MG tablet TAKE 1 TABLET BY MOUTH EVERY 6 HOURS AS NEEDED FOR MODERATE PAIN 30 tablet 0  . valsartan (DIOVAN) 80 MG tablet TAKE 1 TABLET BY MOUTH  DAILY 90 tablet 1  . Vitamin D, Ergocalciferol, (DRISDOL) 50000 units CAPS capsule TAKE 1 CAPSULE BY MOUTH  3  TIMES WEEKLY 33 capsule 5  . zafirlukast (ACCOLATE) 20 MG tablet TAKE 1 TABLET BY MOUTH 2  TIMES DAILY BEFORE MEALS 180 tablet 0   No current facility-administered medications for this visit.     Family History  Problem Relation Age of Onset  . Hypertension Father   . Diabetes Father   . Lung cancer Father   . Heart attack Father        MI at age 48  . Hypertension Mother   . Hyperthyroidism Mother   . Heart disease Mother   . Heart attack Mother        MI at age 59  . Asthma Brother   . Hypertension Brother        younger  . Heart disease Brother        older  . Colon cancer Neg Hx   . Esophageal cancer Neg Hx   . Stomach cancer Neg Hx   . Kidney disease Neg Hx   . Liver disease Neg Hx     ROS:  Pertinent items are noted in HPI.  Otherwise, a comprehensive ROS was negative.  Exam:   LMP 06/11/2012   Weight change: @WEIGHTCHANGE @ Height:      Ht Readings from Last 3 Encounters:  06/24/16 5\' 5"  (1.651 m)  06/17/16 5\' 5"  (1.651 m)  05/01/16 5\' 5"  (1.651 m)    General appearance: alert, cooperative and appears stated age Head: Normocephalic, without obvious abnormality, atraumatic Neck: no adenopathy, supple, symmetrical, trachea midline and thyroid {EXAM; THYROID:18604} Lungs: clear to auscultation bilaterally Breasts: {Exam; breast:13139::"normal appearance, no masses or tenderness"} Heart: regular rate and rhythm Abdomen: soft, non-tender;  bowel sounds normal; no masses,  no organomegaly Extremities: extremities normal, atraumatic, no cyanosis or edema Skin: Skin color, texture, turgor normal. No rashes or lesions Lymph nodes: Cervical, supraclavicular, and axillary nodes normal. No abnormal inguinal nodes palpated Neurologic: Grossly normal   Pelvic: External genitalia:  no lesions              Urethra:  normal appearing urethra with no masses, tenderness or lesions              Bartholins and Skenes: normal                 Vagina: normal appearing vagina with normal color and discharge, no lesions              Cervix: {exam; cervix:14595}              Pap taken: {yes no:314532} Bimanual Exam:  Uterus:  {exam; uterus:12215}              Adnexa: {exam; adnexa:12223}               Rectovaginal: Confirms               Anus:  normal sphincter tone, no lesions  Chaperone was present for exam.  A:  Well Woman with normal exam  P:   {plan; gyn:5269::"mammogram","pap smear","return annually or prn"}

## 2016-11-14 ENCOUNTER — Ambulatory Visit (INDEPENDENT_AMBULATORY_CARE_PROVIDER_SITE_OTHER): Payer: 59 | Admitting: Behavioral Health

## 2016-11-14 DIAGNOSIS — E538 Deficiency of other specified B group vitamins: Secondary | ICD-10-CM | POA: Diagnosis not present

## 2016-11-14 MED ORDER — CYANOCOBALAMIN 1000 MCG/ML IJ SOLN
1000.0000 ug | Freq: Once | INTRAMUSCULAR | Status: AC
  Administered 2016-11-14: 1000 ug via INTRAMUSCULAR

## 2016-11-14 NOTE — Progress Notes (Signed)
Pre visit review using our clinic review tool, if applicable. No additional management support is needed unless otherwise documented below in the visit note.  Patient came in clinic for monthly B12 injection. IM injection given in the left deltoid. Patient tolerated it well. Next appointment scheduled for 12/17/16 at 9:15 AM.

## 2016-11-27 ENCOUNTER — Telehealth: Payer: Self-pay | Admitting: *Deleted

## 2016-11-27 ENCOUNTER — Ambulatory Visit (HOSPITAL_BASED_OUTPATIENT_CLINIC_OR_DEPARTMENT_OTHER): Payer: 59 | Admitting: Family

## 2016-11-27 ENCOUNTER — Other Ambulatory Visit (HOSPITAL_BASED_OUTPATIENT_CLINIC_OR_DEPARTMENT_OTHER): Payer: 59

## 2016-11-27 VITALS — BP 122/64 | HR 89 | Temp 98.9°F | Resp 19 | Wt 317.0 lb

## 2016-11-27 DIAGNOSIS — D500 Iron deficiency anemia secondary to blood loss (chronic): Secondary | ICD-10-CM | POA: Diagnosis not present

## 2016-11-27 DIAGNOSIS — D510 Vitamin B12 deficiency anemia due to intrinsic factor deficiency: Secondary | ICD-10-CM | POA: Diagnosis not present

## 2016-11-27 DIAGNOSIS — D508 Other iron deficiency anemias: Secondary | ICD-10-CM

## 2016-11-27 LAB — CBC WITH DIFFERENTIAL (CANCER CENTER ONLY)
BASO#: 0 10*3/uL (ref 0.0–0.2)
BASO%: 0.3 % (ref 0.0–2.0)
EOS%: 1 % (ref 0.0–7.0)
Eosinophils Absolute: 0.1 10*3/uL (ref 0.0–0.5)
HCT: 43.6 % (ref 34.8–46.6)
HGB: 14.2 g/dL (ref 11.6–15.9)
LYMPH#: 1.7 10*3/uL (ref 0.9–3.3)
LYMPH%: 12.3 % — AB (ref 14.0–48.0)
MCH: 28 pg (ref 26.0–34.0)
MCHC: 32.6 g/dL (ref 32.0–36.0)
MCV: 86 fL (ref 81–101)
MONO#: 0.6 10*3/uL (ref 0.1–0.9)
MONO%: 4.5 % (ref 0.0–13.0)
NEUT#: 11.2 10*3/uL — ABNORMAL HIGH (ref 1.5–6.5)
NEUT%: 81.9 % — ABNORMAL HIGH (ref 39.6–80.0)
PLATELETS: 161 10*3/uL (ref 145–400)
RBC: 5.07 10*6/uL (ref 3.70–5.32)
RDW: 13.8 % (ref 11.1–15.7)
WBC: 13.7 10*3/uL — AB (ref 3.9–10.0)

## 2016-11-27 LAB — CMP (CANCER CENTER ONLY)
ALK PHOS: 94 U/L — AB (ref 26–84)
ALT: 31 U/L (ref 10–47)
AST: 24 U/L (ref 11–38)
Albumin: 3.4 g/dL (ref 3.3–5.5)
BUN: 14 mg/dL (ref 7–22)
CO2: 29 mEq/L (ref 18–33)
Calcium: 8.3 mg/dL (ref 8.0–10.3)
Chloride: 98 mEq/L (ref 98–108)
Creat: 0.7 mg/dl (ref 0.6–1.2)
Glucose, Bld: 103 mg/dL (ref 73–118)
Potassium: 3.6 mEq/L (ref 3.3–4.7)
SODIUM: 138 meq/L (ref 128–145)
TOTAL PROTEIN: 7.1 g/dL (ref 6.4–8.1)
Total Bilirubin: 0.6 mg/dl (ref 0.20–1.60)

## 2016-11-27 NOTE — Progress Notes (Signed)
Hematology and Oncology Follow Up Visit  [name] [id number] [birth year] 61 y.o. 11/27/2016   Principle Diagnosis:  Iron deficiency anemia Pernicious anemia Gastric bypass with malabsorption  Current Therapy:   IV iron as indicated - last received in March 2018 Vitamin B-12 1 mg IM every month - with PCP   Interim History:  [name] is here today for follow-up. She is doing well and has no complaints at this time. She denies fatigue.  No fever, chills, n/v, cough, rash, dizziness, chest pain, palpitations, abdominal pain or changes in bowel or bladder habits.  She has occasional SOB secondary to asthma exacerbation and the heat outside.  No episodes of bleeding, bruising or petechiae. No lymphadenopathy found on exam.  She has chronic swelling in both lower extremities and will elevated her legs when she is able which helps.  She is currently in PT for a minor tear in the right rotator cuff. She is doing well with this.  No numbness or tingling in her extremities at this time.  She has maintained a good appetite and is staying well hydrated. Her weight is stable.  She is staying busy with work and enjoys her job.  ECOG Performance Status: 0 - Asymptomatic  Medications:  Allergies as of 11/27/2016      Reactions   Other Anaphylaxis   NUTS   Amoxicillin-pot Clavulanate Other (See Comments)   headache   Food Hives   Potato   Penicillins Other (See Comments)   headache   Tomato Hives      Medication List       Accurate as of 11/27/16  4:12 PM. Always use your most recent med list.          albuterol (2.5 MG/3ML) 0.083% nebulizer solution Commonly known as:  PROVENTIL INHALE 1 VIAL VIA NEBULIZER EVERY 4 HOURS AS NEEDED WHEEZING   albuterol 108 (90 Base) MCG/ACT inhaler Commonly known as:  PROVENTIL HFA;VENTOLIN HFA Inhale 2 puffs into the lungs every 6 (six) hours as needed for wheezing or shortness of breath.   apixaban 5 MG Tabs tablet Commonly known as:   ELIQUIS Take 1 tablet (5 mg total) by mouth 2 (two) times daily.   calcitRIOL 0.5 MCG capsule Commonly known as:  ROCALTROL Take 1.5 mcg by mouth daily.   cyclobenzaprine 10 MG tablet Commonly known as:  FLEXERIL TAKE 1 TABLET BY MOUTH  EVERY NIGHT AT BEDTIME AS  NEEDED   desoximetasone 0.05 % cream Commonly known as:  TOPICORT Apply topically 2 (two) times daily.   Diclofenac Sodium 2 % Soln Commonly known as:  PENNSAID Place 2 Squirts onto the skin 2 (two) times daily.   diltiazem 240 MG 24 hr capsule Commonly known as:  CARDIZEM CD TAKE 1 CAPSULE BY MOUTH  DAILY   DIPHENHYDRAMINE-PSEUDOEPHED PO Take 1 tablet by mouth every 6 (six) hours as needed (for allegies).   fluticasone 50 MCG/ACT nasal spray Commonly known as:  FLONASE Place 2 sprays into both nostrils daily as needed for allergies or rhinitis.   furosemide 40 MG tablet Commonly known as:  LASIX TAKE 1 TABLET BY MOUTH  DAILY   hydrochlorothiazide 25 MG tablet Commonly known as:  HYDRODIURIL TAKE 1 TABLET BY MOUTH  DAILY   levocetirizine 5 MG tablet Commonly known as:  XYZAL TAKE 1 TABLET BY MOUTH  EVERY EVENING   levothyroxine 100 MCG tablet Commonly known as:  SYNTHROID, LEVOTHROID Take 3 tablets by mouth daily.   MANGANESE PO Take 1,250  mg by mouth 2 (two) times daily.   methocarbamol 500 MG tablet Commonly known as:  ROBAXIN TAKE 1 TABLET BY MOUTH AT  7AM AND AT 2PM AS NEEDED   nystatin cream Commonly known as:  MYCOSTATIN APPLY TO THE AFFECTED AREA TWICE DAILY FOR UP TO 7 DAYS   omeprazole 40 MG capsule Commonly known as:  PRILOSEC TAKE 1 CAPSULE BY MOUTH  DAILY   SYMBICORT 160-4.5 MCG/ACT inhaler Generic drug:  budesonide-formoterol USE 2 PUFFS TWO TIMES DAILY   traMADol 50 MG tablet Commonly known as:  ULTRAM TAKE 1 TABLET BY MOUTH EVERY 6 HOURS AS NEEDED FOR MODERATE PAIN   valsartan 80 MG tablet Commonly known as:  DIOVAN TAKE 1 TABLET BY MOUTH  DAILY   VITAMIN B-12 IJ Inject  as directed every 30 (thirty) days.   Vitamin D (Ergocalciferol) 50000 units Caps capsule Commonly known as:  DRISDOL TAKE 1 CAPSULE BY MOUTH 3  TIMES WEEKLY   zafirlukast 20 MG tablet Commonly known as:  ACCOLATE TAKE 1 TABLET BY MOUTH 2  TIMES DAILY BEFORE MEALS       Allergies:  Allergies  Allergen Reactions  . Other Anaphylaxis    NUTS  . Amoxicillin-Pot Clavulanate Other (See Comments)    headache  . Food Hives    Potato  . Penicillins Other (See Comments)    headache  . Tomato Hives    Past Medical History, Surgical history, Social history, and Family History were reviewed and updated.  Review of Systems: All other 10 point review of systems is negative.   Physical Exam:  weight is 317 lb (143.8 kg) (abnormal). Her oral temperature is 98.9 F (37.2 C). Her blood pressure is 122/64 and her pulse is 89. Her respiration is 19 and oxygen saturation is 94%.   Wt Readings from Last 3 Encounters:  11/27/16 (!) 317 lb (143.8 kg)  07/28/16 (!) 302 lb 1 oz (137 kg)  06/24/16 (!) 306 lb 6.4 oz (139 kg)    Ocular: Sclerae unicteric, pupils equal, round and reactive to light Ear-nose-throat: Oropharynx clear, dentition fair Lymphatic: No cervical, supraclavicular or axillary adenopathy Lungs no rales or rhonchi, good excursion bilaterally Heart regular rate and rhythm, no murmur appreciated Abd soft, nontender, positive bowel sounds, no liver or spleen tip palpated on exam, no fluid wave MSK no focal spinal tenderness, no joint edema Neuro: non-focal, well-oriented, appropriate affect Breasts: Deferred   Lab Results  Component Value Date   WBC 13.7 (H) 11/27/2016   HGB 14.2 11/27/2016   HCT 43.6 11/27/2016   MCV 86 11/27/2016   PLT 161 11/27/2016   Lab Results  Component Value Date   FERRITIN 1,145 (H) 07/28/2016   IRON 44 07/28/2016   TIBC 291 07/28/2016   UIBC 247 07/28/2016   IRONPCTSAT 15 (L) 07/28/2016   Lab Results  Component Value Date    RETICCTPCT 3.1 (H) 03/30/2015   RBC 5.07 11/27/2016   RETICCTABS 131.4 03/30/2015   No results found for: Nils Pyle [facility] Lab Results  Component Value Date   IGGSERUM 1250 06/09/2007   IGA 334 06/09/2007   IGMSERUM 244 06/09/2007   Lab Results  Component Value Date   TOTALPROTELP 7.0 06/09/2007     Chemistry      Component Value Date/Time   NA 138 11/27/2016 1456   NA 139 07/30/2015 1302   K 3.6 11/27/2016 1456   K 3.5 07/30/2015 1302   CL 98 11/27/2016 1456   CO2 29 11/27/2016 1456  CO2 25 07/30/2015 1302   BUN 14 11/27/2016 1456   BUN 16.8 07/30/2015 1302   CREATININE 0.7 11/27/2016 1456   CREATININE 0.8 07/30/2015 1302      Component Value Date/Time   CALCIUM 8.3 11/27/2016 1456   CALCIUM 8.8 07/30/2015 1302   ALKPHOS 94 (H) 11/27/2016 1456   ALKPHOS 99 07/30/2015 1302   AST 24 11/27/2016 1456   AST 36 (H) 07/30/2015 1302   ALT 31 11/27/2016 1456   ALT 68 (H) 07/30/2015 1302   BILITOT 0.60 11/27/2016 1456   BILITOT 0.41 07/30/2015 1302      Impression and Plan: Ms. Soltis is a very pleasant 61 yo caucasian female with iron deficiency anemia secondary to malabsorption after gastric bypass. She also has pernicious anemia and receives monthly B 12 injections with her PCP.  She received IV iron in March and has responded nicely. Hgb is 14.2 with an MCV of 86. She is asymptomatic and has no complaints at this time.  We will see what her iron studies show and bring her back in for infusion next week if needed.  We will plan to see her back in 4 months for repeat lab work and follow-up.  She will contact our office with any questions or concerns. We can certainly see her sooner if need be.   Eliezer Bottom, NP 7/5/20184:12 PM

## 2016-11-27 NOTE — Telephone Encounter (Signed)
Received fax from [facility] requesting refills for vitamin D. Per EPIC, rx sent 09/03/16 x 5 refills. Verified with Yuni at [facility] that refills are remaining and she is unsure why we received fax request. She will fill from refills on file.

## 2016-11-28 ENCOUNTER — Ambulatory Visit: Payer: Commercial Managed Care - HMO | Admitting: Obstetrics & Gynecology

## 2016-11-28 LAB — IRON AND TIBC
%SAT: 13 % — ABNORMAL LOW (ref 21–57)
Iron: 38 ug/dL — ABNORMAL LOW (ref 41–142)
TIBC: 284 ug/dL (ref 236–444)
UIBC: 245 ug/dL (ref 120–384)

## 2016-11-28 LAB — FERRITIN

## 2016-12-01 ENCOUNTER — Telehealth: Payer: Self-pay | Admitting: *Deleted

## 2016-12-01 NOTE — Telephone Encounter (Signed)
Left message to call regarding 06 recall- Patient cancelled her 11-10-16 appointment.  Patient needs AEX/PAP

## 2016-12-02 ENCOUNTER — Telehealth: Payer: Self-pay | Admitting: *Deleted

## 2016-12-02 NOTE — Telephone Encounter (Addendum)
Patient is aware of results. Appointment made.  ----- Message from Eliezer Bottom, NP sent at 11/30/2016 10:03 PM EDT ----- Regarding: Iron  She will need one dose of IV iron. LOS sent to [facility]. Thank you!  Sarah  ----- Message ----- From: Volanda Napoleon, MD Sent: 11/28/2016   2:38 PM To: Eliezer Bottom, NP    ----- Message ----- From: Interface, Lab In Three Zero One Sent: 11/27/2016   3:05 PM To: Volanda Napoleon, MD

## 2016-12-04 ENCOUNTER — Ambulatory Visit (HOSPITAL_BASED_OUTPATIENT_CLINIC_OR_DEPARTMENT_OTHER): Payer: 59

## 2016-12-04 VITALS — BP 136/44 | HR 84 | Temp 97.6°F | Resp 18

## 2016-12-04 DIAGNOSIS — D508 Other iron deficiency anemias: Secondary | ICD-10-CM | POA: Diagnosis not present

## 2016-12-04 MED ORDER — SODIUM CHLORIDE 0.9 % IV SOLN
INTRAVENOUS | Status: DC
  Administered 2016-12-04: 09:00:00 via INTRAVENOUS

## 2016-12-04 MED ORDER — SODIUM CHLORIDE 0.9 % IV SOLN
510.0000 mg | Freq: Once | INTRAVENOUS | Status: AC
  Administered 2016-12-04: 510 mg via INTRAVENOUS
  Filled 2016-12-04: qty 17

## 2016-12-04 NOTE — Patient Instructions (Signed)
Anemia, Nonspecific Anemia is a condition in which the concentration of red blood cells or hemoglobin in the blood is below normal. Hemoglobin is a substance in red blood cells that carries oxygen to the tissues of the body. Anemia results in not enough oxygen reaching these tissues. What are the causes? Common causes of anemia include:  Excessive bleeding. Bleeding may be internal or external. This includes excessive bleeding from periods (in women) or from the intestine.  Poor nutrition.  Chronic kidney, thyroid, and liver disease.  Bone marrow disorders that decrease red blood cell production.  Cancer and treatments for cancer.  HIV, AIDS, and their treatments.  Spleen problems that increase red blood cell destruction.  Blood disorders.  Excess destruction of red blood cells due to infection, medicines, and autoimmune disorders. What are the signs or symptoms?  Minor weakness.  Dizziness.  Headache.  Palpitations.  Shortness of breath, especially with exercise.  Paleness.  Cold sensitivity.  Indigestion.  Nausea.  Difficulty sleeping.  Difficulty concentrating. Symptoms may occur suddenly or they may develop slowly. How is this diagnosed? Additional blood tests are often needed. These help your health care provider determine the best treatment. Your health care provider will check your stool for blood and look for other causes of blood loss. How is this treated? Treatment varies depending on the cause of the anemia. Treatment can include:  Supplements of iron, vitamin B12, or folic acid.  Hormone medicines.  A blood transfusion. This may be needed if blood loss is severe.  Hospitalization. This may be needed if there is significant continual blood loss.  Dietary changes.  Spleen removal. Follow these instructions at home: Keep all follow-up appointments. It often takes many weeks to correct anemia, and having your health care provider check on your  condition and your response to treatment is very important. Get help right away if:  You develop extreme weakness, shortness of breath, or chest pain.  You become dizzy or have trouble concentrating.  You develop heavy vaginal bleeding.  You develop a rash.  You have bloody or black, tarry stools.  You faint.  You vomit up blood.  You vomit repeatedly.  You have abdominal pain.  You have a fever or persistent symptoms for more than 2-3 days.  You have a fever and your symptoms suddenly get worse.  You are dehydrated. This information is not intended to replace advice given to you by your health care provider. Make sure you discuss any questions you have with your health care provider. Document Released: 06/19/2004 Document Revised: 10/24/2015 Document Reviewed: 11/05/2012 Elsevier Interactive Patient Education  2017 [employer]  

## 2016-12-08 ENCOUNTER — Emergency Department (HOSPITAL_COMMUNITY): Payer: 59

## 2016-12-08 ENCOUNTER — Encounter (HOSPITAL_COMMUNITY): Payer: Self-pay | Admitting: Emergency Medicine

## 2016-12-08 ENCOUNTER — Emergency Department (HOSPITAL_COMMUNITY)
Admission: EM | Admit: 2016-12-08 | Discharge: 2016-12-08 | Disposition: A | Discharge status: Home / Self Care | Payer: 59 | Attending: Emergency Medicine | Admitting: Emergency Medicine

## 2016-12-08 DIAGNOSIS — Y929 Unspecified place or not applicable: Secondary | ICD-10-CM | POA: Insufficient documentation

## 2016-12-08 DIAGNOSIS — I10 Essential (primary) hypertension: Secondary | ICD-10-CM | POA: Diagnosis not present

## 2016-12-08 DIAGNOSIS — Z7901 Long term (current) use of anticoagulants: Secondary | ICD-10-CM | POA: Diagnosis not present

## 2016-12-08 DIAGNOSIS — Z85850 Personal history of malignant neoplasm of thyroid: Secondary | ICD-10-CM | POA: Diagnosis not present

## 2016-12-08 DIAGNOSIS — E039 Hypothyroidism, unspecified: Secondary | ICD-10-CM | POA: Insufficient documentation

## 2016-12-08 DIAGNOSIS — W19XXXA Unspecified fall, initial encounter: Secondary | ICD-10-CM

## 2016-12-08 DIAGNOSIS — W010XXA Fall on same level from slipping, tripping and stumbling without subsequent striking against object, initial encounter: Secondary | ICD-10-CM | POA: Insufficient documentation

## 2016-12-08 DIAGNOSIS — J45909 Unspecified asthma, uncomplicated: Secondary | ICD-10-CM | POA: Insufficient documentation

## 2016-12-08 DIAGNOSIS — S4992XA Unspecified injury of left shoulder and upper arm, initial encounter: Secondary | ICD-10-CM | POA: Diagnosis not present

## 2016-12-08 DIAGNOSIS — Z8542 Personal history of malignant neoplasm of other parts of uterus: Secondary | ICD-10-CM | POA: Diagnosis not present

## 2016-12-08 DIAGNOSIS — Z87891 Personal history of nicotine dependence: Secondary | ICD-10-CM | POA: Insufficient documentation

## 2016-12-08 DIAGNOSIS — M25512 Pain in left shoulder: Secondary | ICD-10-CM | POA: Diagnosis not present

## 2016-12-08 DIAGNOSIS — Y999 Unspecified external cause status: Secondary | ICD-10-CM | POA: Insufficient documentation

## 2016-12-08 DIAGNOSIS — G8911 Acute pain due to trauma: Secondary | ICD-10-CM | POA: Diagnosis not present

## 2016-12-08 DIAGNOSIS — Y9301 Activity, walking, marching and hiking: Secondary | ICD-10-CM | POA: Diagnosis not present

## 2016-12-08 DIAGNOSIS — Z79899 Other long term (current) drug therapy: Secondary | ICD-10-CM | POA: Insufficient documentation

## 2016-12-08 DIAGNOSIS — R5381 Other malaise: Secondary | ICD-10-CM | POA: Diagnosis not present

## 2016-12-08 MED ORDER — ACETAMINOPHEN 325 MG PO TABS: 650.0000 mg | ORAL_TABLET | Freq: Four times a day (QID) | ORAL | 0 refills | Status: AC | PRN

## 2016-12-08 NOTE — ED Notes (Signed)
Pt to xray

## 2016-12-08 NOTE — ED Notes (Signed)
Bed: WTR9 Expected date:  Expected time:  Means of arrival:  Comments: 

## 2016-12-08 NOTE — ED Provider Notes (Signed)
[facility] DEPT Provider Note   CSN: [id number] Arrival date & time: 12/08/16  4259  By signing my name below, I, [name], attest that this documentation has been prepared under the direction and in the presence of non-physician practitioner, Waynetta Pean, PA-C. Electronically Signed: Theresia Rogers, ED Scribe. 12/08/16. 12:44 PM.  History   Chief Complaint Chief Complaint  Patient presents with  . Fall  . Shoulder Injury   The history is provided by the patient and the spouse. No language interpreter was used.   HPI Comments: [name] is a 61 y.o. female who presents to the Emergency Department complaining of constant, moderate left shoulder pain onset this morning s/p a mechanical, ground-level fall. She tripped on carpet. [name] states she tripped and fell backwards onto her shoulder.  No LOC or head injury. At home, she normally uses a cane and a walker. Per [name], she injured her right shoulder rotator cuff recently and is in [name] and states that the pain feels similar. [name] states pain is exacerbated by movement of the shoulder. She reports taking advil at home with some relief. No hx of OA. [name] takes Eliquis. [name] denies numbness, neck pain, back pain, head injury, or any other complaints at this time.  Past Medical History:  Diagnosis Date  . Abnormal Pap smear    years ago/no biopsy  . Allergic rhinitis   . Anemia, iron deficiency   . Arthritis    back- lower  . Asthma   . Atrial fibrillation ([place]) August 2012   OFF XARELTO LAST MONTH DUE TO BLEEDING IN STOOL  . Bursitis of hip left  . Dysrhythmia    afib, followed by Dr. Stanford Breed   . Edema of both legs   . Fatty liver 01/07/2012  . Fibroid 1974   fibroid cyst on left fallopian tube  . Fibromyalgia   . Foot ulcer ([place])    AREA HEALED RIGHT FOOT  . GERD (gastroesophageal reflux disease)   . Headache(784.0)    occasional sinus headache   . History of blood transfusion JULY 2015  . Hypertension   .  Hypoparathyroidism ([place]) 01/07/2011  . Hypothyroidism   . Kidney stone 2015-10-22   passed on their own  . Morbid obesity ([place])   . Nephrolithiasis 2/16, 9/16   SEES DR Risa Grill  . Pernicious anemia 02/20/2014   followed by Debbrah Alar  . PONV (postoperative nausea and vomiting)   . Seizures ([place])    infancy secondary to fever  . Thyroid cancer ([place]) 2011   THYROIDECTOMY DONE  . Uterine cancer ([place]) 2014   Mirena IUD  . Vitamin D deficiency     Patient Active Problem List   Diagnosis Date Noted  . Ureteral calculus, right 07/17/2014  . Osteomyelitis ([place]) 06/14/2014  . Pernicious anemia 02/20/2014  . Reflux esophagitis 02/02/2014  . Edema 11/23/2013  . Foot pain 03/31/2013  . Endometrial adenocarcinoma ([place]) 02/21/2013  . Trochanteric bursitis of left hip 09/13/2012  . Fatty liver 01/07/2012  . Mid back pain 09/30/2011  . Weight gain 07/02/2011  . Nonspecific abnormal unspecified cardiovascular function study 04/30/2011  . Hx of papillary thyroid carcinoma 04/07/2011  . Low back pain 04/01/2011  . Atrial fibrillation ([place]) 02/05/2011  . Hypoparathyroidism ([place]) 01/07/2011  . GERD 05/22/2009  . LEUKOCYTOSIS 03/24/2009  . OBESITY, MORBID 12/20/2008  . RHINOSINUSITIS, RECURRENT 09/07/2008  . Vitamin D deficiency 05/10/2007  . Iron deficiency anemia secondary to malabsorption  05/10/2007  . HYPOTHYROIDISM 01/05/2007  .  Essential hypertension 01/05/2007  . ALLERGIC RHINITIS 01/05/2007  . Asthma 01/05/2007    Past Surgical History:  Procedure Laterality Date  . AMPUTATION Right 05/18/2014   Procedure: RIGHT FIFTH RAY AMPUTATION FOOT;  Surgeon: Wylene Simmer, MD;  Location: [facility];  Service: Orthopedics;  Laterality: Right;  . APPENDECTOMY    . BUNIONECTOMY     bilateral  . COLONOSCOPY WITH PROPOFOL N/A 02/02/2014   Procedure: COLONOSCOPY WITH PROPOFOL;  Surgeon: Irene Shipper, MD;  Location: [facility];  Service: Endoscopy;  Laterality: N/A;  . cyst on ovary removed      . CYSTOSCOPY W/ RETROGRADES  03/03/2012   Procedure: CYSTOSCOPY WITH RETROGRADE PYELOGRAM;  Surgeon: Bernestine Amass, MD;  Location: [facility];  Service: Urology;  Laterality: Bilateral;  . CYSTOSCOPY WITH RETROGRADE PYELOGRAM, URETEROSCOPY AND STENT PLACEMENT Left 08/25/2012   Procedure: CYSTOSCOPY WITH RETROGRADE PYELOGRAM, URETEROSCOPY;  Surgeon: Bernestine Amass, MD;  Location: [facility];  Service: Urology;  Laterality: Left;  . DILATION AND CURETTAGE OF UTERUS N/A 02/21/2013   Procedure: DILATATION AND CURETTAGE;  Surgeon: Lyman Speller, MD;  Location: [facility];  Service: Gynecology;  Laterality: N/A;  . DILATION AND CURETTAGE OF UTERUS N/A 04/09/2015   Procedure: [place] IUD removal;  Surgeon: Megan Salon, MD;  Location: [facility];  Service: Gynecology;  Laterality: N/A;  Patient weight 307lbs  . ESOPHAGOGASTRODUODENOSCOPY (EGD) WITH PROPOFOL N/A 02/02/2014   Procedure: ESOPHAGOGASTRODUODENOSCOPY (EGD) WITH PROPOFOL;  Surgeon: Irene Shipper, MD;  [facility];  Service: Endoscopy;  Laterality: N/A;  . GASTRIC BYPASS  1974  . HOLMIUM LASER APPLICATION Left 0/01/3266   Procedure: HOLMIUM LASER APPLICATION;  Surgeon: Bernestine Amass, MD;  Location: [facility];  Service: Urology;  Laterality: Left;  . LITHOTRIPSY  03/2012  . LITHOTRIPSY  2/16  . THYROIDECTOMY  05/15/2010  . TONSILLECTOMY AND ADENOIDECTOMY    . URETEROSCOPY  03/03/2012   Procedure: URETEROSCOPY;  Surgeon: Bernestine Amass, MD;  Location: [facility];  Service: Urology;  Laterality: Left;    OB History    Gravida Para Term Preterm AB Living   0 0 0 0 0 0   SAB TAB Ectopic Multiple Live Births   0 0 0 0         Home Medications    Prior to Admission medications   Medication Sig Start Date End Date Taking? Authorizing Provider  albuterol (PROVENTIL HFA;VENTOLIN HFA) 108 (90 Base) MCG/ACT inhaler Inhale 2 puffs into the lungs every 6 (six) hours as needed for wheezing or shortness of breath. 11/10/16   Debbrah Alar, NP  albuterol (PROVENTIL) (2.5 MG/3ML) 0.083% nebulizer solution INHALE 1 VIAL VIA NEBULIZER EVERY 4 HOURS AS NEEDED WHEEZING 07/30/15   Debbrah Alar, NP  apixaban (ELIQUIS) 5 MG TABS tablet Take 1 tablet (5 mg total) by mouth 2 (two) times daily. 06/17/16   Lelon Perla, MD  calcitRIOL (ROCALTROL) 0.5 MCG capsule Take 1.5 mcg by mouth daily.     [provider]  Cyanocobalamin (VITAMIN B-12 IJ) Inject as directed every 30 (thirty) days.    [provider]  cyclobenzaprine (FLEXERIL) 10 MG tablet TAKE 1 TABLET BY MOUTH  EVERY NIGHT AT BEDTIME AS  NEEDED 06/25/16   Panwala, Naitik, PA-C  desoximetasone (TOPICORT) 0.05 % cream Apply topically 2 (two) times daily. 09/18/15   Debbrah Alar, NP  Diclofenac Sodium (PENNSAID) 2 % SOLN Place 2 Squirts onto the skin 2 (two) times daily. 08/20/16   Marlou Sa,  Tonna Corner, MD  diltiazem (CARDIZEM CD) 240 MG 24 hr capsule TAKE 1 CAPSULE BY MOUTH  DAILY 09/03/16   Debbrah Alar, NP  DIPHENHYDRAMINE-PSEUDOEPHED PO Take 1 tablet by mouth every 6 (six) hours as needed (for allegies).    [provider]  fluticasone (FLONASE) 50 MCG/ACT nasal spray Place 2 sprays into both nostrils daily as needed for allergies or rhinitis.     [provider]  furosemide (LASIX) 40 MG tablet TAKE 1 TABLET BY MOUTH  DAILY 05/28/16   Debbrah Alar, NP  hydrochlorothiazide (HYDRODIURIL) 25 MG tablet TAKE 1 TABLET BY MOUTH  DAILY 10/29/16   Debbrah Alar, NP  levocetirizine (XYZAL) 5 MG tablet TAKE 1 TABLET BY MOUTH  EVERY EVENING 08/04/16   Debbrah Alar, NP  levothyroxine (SYNTHROID, LEVOTHROID) 100 MCG tablet Take 3 tablets by mouth daily. 04/10/16   [provider]  MANGANESE PO Take 1,250 mg by mouth 2 (two) times daily.    [provider]  methocarbamol (ROBAXIN) 500 MG tablet TAKE 1 TABLET BY MOUTH AT  7AM AND AT 2PM AS NEEDED 06/25/16   Panwala, Naitik, PA-C  nystatin cream (MYCOSTATIN)  APPLY TO THE AFFECTED AREA TWICE DAILY FOR UP TO 7 DAYS 11/07/16   Debbrah Alar, NP  omeprazole (PRILOSEC) 40 MG capsule TAKE 1 CAPSULE BY MOUTH  DAILY 08/25/16   Debbrah Alar, NP  SYMBICORT 160-4.5 MCG/ACT inhaler USE 2 PUFFS TWO TIMES DAILY 10/29/16   Debbrah Alar, NP  traMADol (ULTRAM) 50 MG tablet TAKE 1 TABLET BY MOUTH EVERY 6 HOURS AS NEEDED FOR MODERATE PAIN 09/02/16   Debbrah Alar, NP  valsartan (DIOVAN) 80 MG tablet TAKE 1 TABLET BY MOUTH  DAILY 08/04/16   Debbrah Alar, NP  Vitamin D, Ergocalciferol, (DRISDOL) 50000 units CAPS capsule TAKE 1 CAPSULE BY MOUTH 3  TIMES WEEKLY 09/03/16   Debbrah Alar, NP  zafirlukast (ACCOLATE) 20 MG tablet TAKE 1 TABLET BY MOUTH 2  TIMES DAILY BEFORE MEALS 08/18/16   Debbrah Alar, NP    Family History Family History  Problem Relation Age of Onset  . Hypertension Father   . Diabetes Father   . Lung cancer Father   . Heart attack Father        MI at age 69  . Hypertension Mother   . Hyperthyroidism Mother   . Heart disease Mother   . Heart attack Mother        MI at age 44  . Asthma Brother   . Hypertension Brother        younger  . Heart disease Brother        older  . Colon cancer Neg Hx   . Esophageal cancer Neg Hx   . Stomach cancer Neg Hx   . Kidney disease Neg Hx   . Liver disease Neg Hx     Social History Social History  Substance Use Topics  . Smoking status: Former Smoker    Packs/day: 0.50    Years: 25.00    Types: Cigarettes    Start date: 12/24/1970    Quit date: 05/27/1995  . Smokeless tobacco: Never Used     Comment: quit smoking 19 years ago  . Alcohol use 0.0 oz/week     Comment: 1/2 glass per month     Allergies   Other; Amoxicillin-pot clavulanate; Food; Penicillins; and Tomato   Review of Systems Review of Systems  Constitutional: Negative for fever.  Eyes: Negative for visual disturbance.  Musculoskeletal: Positive for arthralgias and  myalgias. Negative for back  pain and neck pain.  Skin: Negative for rash and wound.  Neurological: Negative for syncope, numbness and headaches.     Physical Exam Updated Vital Signs BP 139/75 (BP Location: Right Arm)   Pulse (!) 103   Temp 98.5 F (36.9 C) (Oral)   Resp 18   Ht 5\' 4"  (1.626 m)   Wt (!) 310 lb (140.6 kg)   LMP 06/11/2012   SpO2 100%   BMI 53.21 kg/m   Physical Exam  Constitutional: She appears well-developed and well-nourished. No distress.  HENT:  Head: Normocephalic and atraumatic.  Right Ear: External ear normal.  Left Ear: External ear normal.  Eyes: Pupils are equal, round, and reactive to light. Right eye exhibits no discharge. Left eye exhibits no discharge.  Neck: Neck supple.  Cardiovascular: Normal rate, regular rhythm and intact distal pulses.   Bilateral radial pulses are intact.   Pulmonary/Chest: Effort normal. No respiratory distress.  Musculoskeletal: Normal range of motion. She exhibits tenderness. She exhibits no edema or deformity.  No midline neck or back TTP.  Pain with ROM of left shoulder. No left shoulder edema, ecchymosis or deformity. No bony point tenderness. No clavicle tenderness bilaterally. No tenderness to her left elbow or wrist. Good strength with flexion and extension at her elbow.  Lymphadenopathy:    She has no cervical adenopathy.  Neurological: She is alert. No sensory deficit. Coordination normal.  Skin: Skin is warm and dry. Capillary refill takes less than 2 seconds. No rash noted. She is not diaphoretic. No erythema. No pallor.  Psychiatric: She has a normal mood and affect. Her behavior is normal.  Nursing note and vitals reviewed.    ED Treatments / Results  DIAGNOSTIC STUDIES: Oxygen Saturation is 100% on RA, normal by my interpretation.   COORDINATION OF CARE: 12:39 PM-Discussed next steps with [name] including follow up with ortho. [name] verbalized understanding and is agreeable with the plan.   Labs (all labs ordered are listed, but  only abnormal results are displayed) Labs Reviewed - No data to display  EKG  EKG Interpretation None       Radiology Dg Shoulder Left  Result Date: 12/08/2016 CLINICAL DATA:  Fall with left shoulder injury.  Initial encounter. EXAM: LEFT SHOULDER - 2+ VIEW COMPARISON:  None. FINDINGS: There is no evidence of fracture or dislocation. Moderate degenerative disease is seen involving the [facility] joint. No bony lesions or destruction. Soft tissues are unremarkable. IMPRESSION: No acute findings.  Moderate degenerative disease of the [facility] joint. Electronically Signed   By: Aletta Edouard M.D.   On: 12/08/2016 12:01    Procedures Procedures (including critical care time)  Medications Ordered in ED Medications - No data to display   Initial Impression / Assessment and Plan / ED Course  I have reviewed the triage vital signs and the nursing notes.  Pertinent labs & imaging results that were available during my care of the patient were reviewed by me and considered in my medical decision making (see chart for details).    This  is a 61 y.o. female who presents to the Emergency Department complaining of constant, moderate left shoulder pain onset this morning s/p a mechanical, ground-level fall. She tripped on carpet. [name] states she tripped and fell backwards onto her shoulder.  No LOC or head injury. At home, she normally uses a cane and a walker. Per [name], she injured her right shoulder rotator cuff recently and is in [name] and states  that the pain feels similar. [name] states pain is exacerbated by movement of the shoulder. She reports taking advil at home with some relief. [name] takes Eliquis. On exam patient is afebrile nontoxic appearing. She has pain with range of motion of her left shoulder. No shoulder deformity, ecchymosis or warmth. No left elbow tenderness to palpation. X-ray of her left shoulder shows no acute findings. It shows degenerative disease of the before meals joint. Patient ambulated in the  emergency department prior to discharge. We will have her follow up closely with primary care. I advised her to discontinue taking all NSAIDs as the patient is on a liquid. Tylenol and ice for pain control. Follow-up with orthopedic surgery for possible referral to [name] for her left shoulder as well. I advised the patient to follow-up with their primary care provider this week. I advised the patient to return to the emergency department with new or worsening symptoms or new concerns. The patient verbalized understanding and agreement with plan.     Final Clinical Impressions(s) / ED Diagnoses   Final diagnoses:  Acute pain of left shoulder  Fall, initial encounter    New Prescriptions New Prescriptions   No medications on file   I personally performed the services described in this documentation, which was scribed in my presence. The recorded information has been reviewed and is accurate.        Waynetta Pean, PA-C 12/08/16 1255    Dorie Rank, MD 12/08/16 [phone]

## 2016-12-08 NOTE — ED Notes (Signed)
Bed: WBH38 Expected date:  Expected time:  Means of arrival:  Comments: [facility] Room 

## 2016-12-08 NOTE — ED Triage Notes (Signed)
Per PTAR, states she fell in bedroom-landed on buttocks-injured left shoulder-states she in in PT for right shoulder-states arm locked up as she was bracing herself during fall

## 2016-12-09 ENCOUNTER — Ambulatory Visit: Payer: 59 | Admitting: Family

## 2016-12-09 ENCOUNTER — Encounter (HOSPITAL_COMMUNITY): Payer: Self-pay | Admitting: Vascular Surgery

## 2016-12-09 ENCOUNTER — Emergency Department (HOSPITAL_COMMUNITY): Payer: 59

## 2016-12-09 ENCOUNTER — Observation Stay (HOSPITAL_COMMUNITY)
Admission: EM | Admit: 2016-12-09 | Discharge: 2016-12-12 | Disposition: A | Discharge status: Skilled Nursing Facility | Payer: 59 | Attending: Family Medicine | Admitting: Family Medicine

## 2016-12-09 DIAGNOSIS — S81019A Laceration without foreign body, unspecified knee, initial encounter: Secondary | ICD-10-CM | POA: Diagnosis not present

## 2016-12-09 DIAGNOSIS — R079 Chest pain, unspecified: Secondary | ICD-10-CM | POA: Diagnosis not present

## 2016-12-09 DIAGNOSIS — J329 Chronic sinusitis, unspecified: Secondary | ICD-10-CM | POA: Insufficient documentation

## 2016-12-09 DIAGNOSIS — Z79899 Other long term (current) drug therapy: Secondary | ICD-10-CM | POA: Insufficient documentation

## 2016-12-09 DIAGNOSIS — M6282 Rhabdomyolysis: Secondary | ICD-10-CM | POA: Diagnosis not present

## 2016-12-09 DIAGNOSIS — E860 Dehydration: Secondary | ICD-10-CM | POA: Insufficient documentation

## 2016-12-09 DIAGNOSIS — Z89421 Acquired absence of other right toe(s): Secondary | ICD-10-CM | POA: Insufficient documentation

## 2016-12-09 DIAGNOSIS — Z8542 Personal history of malignant neoplasm of other parts of uterus: Secondary | ICD-10-CM | POA: Diagnosis not present

## 2016-12-09 DIAGNOSIS — M7062 Trochanteric bursitis, left hip: Secondary | ICD-10-CM | POA: Diagnosis not present

## 2016-12-09 DIAGNOSIS — K760 Fatty (change of) liver, not elsewhere classified: Secondary | ICD-10-CM | POA: Insufficient documentation

## 2016-12-09 DIAGNOSIS — Z87442 Personal history of urinary calculi: Secondary | ICD-10-CM | POA: Diagnosis not present

## 2016-12-09 DIAGNOSIS — Z880 Allergy status to penicillin: Secondary | ICD-10-CM | POA: Insufficient documentation

## 2016-12-09 DIAGNOSIS — D509 Iron deficiency anemia, unspecified: Secondary | ICD-10-CM | POA: Diagnosis not present

## 2016-12-09 DIAGNOSIS — I10 Essential (primary) hypertension: Secondary | ICD-10-CM | POA: Insufficient documentation

## 2016-12-09 DIAGNOSIS — K210 Gastro-esophageal reflux disease with esophagitis: Secondary | ICD-10-CM | POA: Diagnosis not present

## 2016-12-09 DIAGNOSIS — M1990 Unspecified osteoarthritis, unspecified site: Secondary | ICD-10-CM | POA: Diagnosis not present

## 2016-12-09 DIAGNOSIS — Z85850 Personal history of malignant neoplasm of thyroid: Secondary | ICD-10-CM | POA: Insufficient documentation

## 2016-12-09 DIAGNOSIS — M869 Osteomyelitis, unspecified: Secondary | ICD-10-CM | POA: Insufficient documentation

## 2016-12-09 DIAGNOSIS — D72829 Elevated white blood cell count, unspecified: Secondary | ICD-10-CM | POA: Diagnosis not present

## 2016-12-09 DIAGNOSIS — R296 Repeated falls: Secondary | ICD-10-CM

## 2016-12-09 DIAGNOSIS — I480 Paroxysmal atrial fibrillation: Secondary | ICD-10-CM | POA: Diagnosis not present

## 2016-12-09 DIAGNOSIS — E876 Hypokalemia: Principal | ICD-10-CM | POA: Diagnosis present

## 2016-12-09 DIAGNOSIS — E785 Hyperlipidemia, unspecified: Secondary | ICD-10-CM | POA: Diagnosis not present

## 2016-12-09 DIAGNOSIS — Z7901 Long term (current) use of anticoagulants: Secondary | ICD-10-CM | POA: Insufficient documentation

## 2016-12-09 DIAGNOSIS — Z6841 Body Mass Index (BMI) 40.0 and over, adult: Secondary | ICD-10-CM | POA: Diagnosis not present

## 2016-12-09 DIAGNOSIS — Z8349 Family history of other endocrine, nutritional and metabolic diseases: Secondary | ICD-10-CM | POA: Insufficient documentation

## 2016-12-09 DIAGNOSIS — T148XXA Other injury of unspecified body region, initial encounter: Secondary | ICD-10-CM | POA: Diagnosis not present

## 2016-12-09 DIAGNOSIS — D510 Vitamin B12 deficiency anemia due to intrinsic factor deficiency: Secondary | ICD-10-CM | POA: Diagnosis not present

## 2016-12-09 DIAGNOSIS — R109 Unspecified abdominal pain: Secondary | ICD-10-CM

## 2016-12-09 DIAGNOSIS — Z23 Encounter for immunization: Secondary | ICD-10-CM | POA: Diagnosis not present

## 2016-12-09 DIAGNOSIS — K909 Intestinal malabsorption, unspecified: Secondary | ICD-10-CM | POA: Insufficient documentation

## 2016-12-09 DIAGNOSIS — M797 Fibromyalgia: Secondary | ICD-10-CM | POA: Insufficient documentation

## 2016-12-09 DIAGNOSIS — Z91018 Allergy to other foods: Secondary | ICD-10-CM | POA: Insufficient documentation

## 2016-12-09 DIAGNOSIS — Z801 Family history of malignant neoplasm of trachea, bronchus and lung: Secondary | ICD-10-CM | POA: Insufficient documentation

## 2016-12-09 DIAGNOSIS — Z825 Family history of asthma and other chronic lower respiratory diseases: Secondary | ICD-10-CM | POA: Insufficient documentation

## 2016-12-09 DIAGNOSIS — E039 Hypothyroidism, unspecified: Secondary | ICD-10-CM | POA: Diagnosis present

## 2016-12-09 DIAGNOSIS — J45909 Unspecified asthma, uncomplicated: Secondary | ICD-10-CM | POA: Diagnosis not present

## 2016-12-09 DIAGNOSIS — Z833 Family history of diabetes mellitus: Secondary | ICD-10-CM | POA: Insufficient documentation

## 2016-12-09 DIAGNOSIS — Z8249 Family history of ischemic heart disease and other diseases of the circulatory system: Secondary | ICD-10-CM | POA: Insufficient documentation

## 2016-12-09 DIAGNOSIS — Z9884 Bariatric surgery status: Secondary | ICD-10-CM | POA: Insufficient documentation

## 2016-12-09 DIAGNOSIS — Z87891 Personal history of nicotine dependence: Secondary | ICD-10-CM | POA: Insufficient documentation

## 2016-12-09 LAB — CBC WITH DIFFERENTIAL/PLATELET
Basophils Absolute: 0 10*3/uL (ref 0.0–0.1)
Basophils Relative: 0 %
EOS ABS: 0 10*3/uL (ref 0.0–0.7)
EOS PCT: 0 %
HCT: 40.3 % (ref 36.0–46.0)
Hemoglobin: 13 g/dL (ref 12.0–15.0)
LYMPHS ABS: 1.4 10*3/uL (ref 0.7–4.0)
LYMPHS PCT: 9 %
MCH: 26.8 pg (ref 26.0–34.0)
MCHC: 32.3 g/dL (ref 30.0–36.0)
MCV: 83.1 fL (ref 78.0–100.0)
MONO ABS: 0.7 10*3/uL (ref 0.1–1.0)
Monocytes Relative: 5 %
Neutro Abs: 12.8 10*3/uL — ABNORMAL HIGH (ref 1.7–7.7)
Neutrophils Relative %: 86 %
PLATELETS: 180 10*3/uL (ref 150–400)
RBC: 4.85 MIL/uL (ref 3.87–5.11)
RDW: 14.7 % (ref 11.5–15.5)
WBC: 14.9 10*3/uL — AB (ref 4.0–10.5)

## 2016-12-09 LAB — COMPREHENSIVE METABOLIC PANEL
ALT: 32 U/L (ref 14–54)
AST: 35 U/L (ref 15–41)
Albumin: 3 g/dL — ABNORMAL LOW (ref 3.5–5.0)
Alkaline Phosphatase: 79 U/L (ref 38–126)
Anion gap: 10 (ref 5–15)
BILIRUBIN TOTAL: 1 mg/dL (ref 0.3–1.2)
BUN: 19 mg/dL (ref 6–20)
CO2: 24 mmol/L (ref 22–32)
Calcium: 7.5 mg/dL — ABNORMAL LOW (ref 8.9–10.3)
Chloride: 102 mmol/L (ref 101–111)
Creatinine, Ser: 0.88 mg/dL (ref 0.44–1.00)
GFR calc Af Amer: >60 mL/min (ref >60)
GFR calc non Af Amer: >60 mL/min (ref >60)
GLUCOSE: 105 mg/dL — AB (ref 65–99)
POTASSIUM: 3.1 mmol/L — AB (ref 3.5–5.1)
Sodium: 136 mmol/L (ref 135–145)
TOTAL PROTEIN: 6.4 g/dL — AB (ref 6.5–8.1)

## 2016-12-09 LAB — URINALYSIS, ROUTINE W REFLEX MICROSCOPIC
Bilirubin Urine: NEGATIVE
GLUCOSE, UA: NEGATIVE mg/dL
Ketones, ur: 20 mg/dL — AB
Leukocytes, UA: NEGATIVE
NITRITE: NEGATIVE
PH: 5 (ref 5.0–8.0)
PROTEIN: 100 mg/dL — AB
SPECIFIC GRAVITY, URINE: 1.027 (ref 1.005–1.030)
Squamous Epithelial / LPF: NONE SEEN

## 2016-12-09 LAB — I-STAT CHEM 8, ED
BUN: 20 mg/dL (ref 6–20)
CALCIUM ION: 0.94 mmol/L — AB (ref 1.15–1.40)
Chloride: 101 mmol/L (ref 101–111)
Creatinine, Ser: 0.8 mg/dL (ref 0.44–1.00)
GLUCOSE: 99 mg/dL (ref 65–99)
HCT: 41 % (ref 36.0–46.0)
HEMOGLOBIN: 13.9 g/dL (ref 12.0–15.0)
Potassium: 3.2 mmol/L — ABNORMAL LOW (ref 3.5–5.1)
SODIUM: 140 mmol/L (ref 135–145)
TCO2: 24 mmol/L (ref 0–100)

## 2016-12-09 LAB — TSH: TSH: 1.394 u[IU]/mL (ref 0.350–4.500)

## 2016-12-09 LAB — CK: Total CK: 958 U/L — ABNORMAL HIGH (ref 38–234)

## 2016-12-09 MED ORDER — ALBUTEROL SULFATE HFA 108 (90 BASE) MCG/ACT IN AERS: 2.0000 | INHALATION_SPRAY | Freq: Four times a day (QID) | RESPIRATORY_TRACT | Status: DC | PRN

## 2016-12-09 MED ORDER — POTASSIUM CHLORIDE CRYS ER 20 MEQ PO TBCR
40.0000 meq | EXTENDED_RELEASE_TABLET | ORAL | Status: AC
  Administered 2016-12-10: 40 meq via ORAL
  Filled 2016-12-09: qty 2

## 2016-12-09 MED ORDER — MONTELUKAST SODIUM 10 MG PO TABS
10.0000 mg | ORAL_TABLET | Freq: Every day | ORAL | Status: DC
  Administered 2016-12-10 – 2016-12-11 (×3): 10 mg via ORAL
  Filled 2016-12-09 (×3): qty 1

## 2016-12-09 MED ORDER — ACETAMINOPHEN 325 MG PO TABS: 650.0000 mg | ORAL_TABLET | Freq: Four times a day (QID) | ORAL | Status: DC | PRN

## 2016-12-09 MED ORDER — ACETAMINOPHEN 500 MG PO TABS
1000.0000 mg | ORAL_TABLET | Freq: Once | ORAL | Status: AC
Start: 2016-12-09 — End: 2016-12-09
  Administered 2016-12-09: 1000 mg via ORAL
  Filled 2016-12-09: qty 2

## 2016-12-09 MED ORDER — IRBESARTAN 75 MG PO TABS
75.0000 mg | ORAL_TABLET | Freq: Every day | ORAL | Status: DC
  Administered 2016-12-10 – 2016-12-12 (×3): 75 mg via ORAL
  Filled 2016-12-09 (×3): qty 1

## 2016-12-09 MED ORDER — SODIUM CHLORIDE 0.9 % IV BOLUS (SEPSIS)
1000.0000 mL | Freq: Once | INTRAVENOUS | Status: AC
  Administered 2016-12-09: 1000 mL via INTRAVENOUS

## 2016-12-09 MED ORDER — FLUTICASONE PROPIONATE 50 MCG/ACT NA SUSP: 2.0000 | Freq: Every day | NASAL | Status: DC | PRN

## 2016-12-09 MED ORDER — NYSTATIN 100000 UNIT/GM EX CREA
TOPICAL_CREAM | Freq: Two times a day (BID) | CUTANEOUS | Status: DC
  Administered 2016-12-10 – 2016-12-12 (×6): via TOPICAL
  Filled 2016-12-09 (×2): qty 15

## 2016-12-09 MED ORDER — SODIUM CHLORIDE 0.9 % IV SOLN
1.0000 g | Freq: Once | INTRAVENOUS | Status: AC
  Administered 2016-12-10: 1 g via INTRAVENOUS
  Filled 2016-12-09: qty 10

## 2016-12-09 MED ORDER — ACETAMINOPHEN 650 MG RE SUPP
650.0000 mg | Freq: Four times a day (QID) | RECTAL | Status: DC | PRN
Start: 2016-12-09 — End: 2016-12-12

## 2016-12-09 MED ORDER — DICLOFENAC SODIUM 1 % TD GEL
1.0000 "application " | Freq: Two times a day (BID) | TRANSDERMAL | Status: DC
  Administered 2016-12-10 – 2016-12-12 (×6): 1 via TOPICAL
  Filled 2016-12-09 (×2): qty 100

## 2016-12-09 MED ORDER — CALCITRIOL 0.5 MCG PO CAPS
1.5000 ug | ORAL_CAPSULE | Freq: Every day | ORAL | Status: DC
  Administered 2016-12-10 – 2016-12-12 (×3): 1.5 ug via ORAL
  Filled 2016-12-09 (×3): qty 3

## 2016-12-09 MED ORDER — LORATADINE 10 MG PO TABS
10.0000 mg | ORAL_TABLET | Freq: Every evening | ORAL | Status: DC
  Administered 2016-12-10 – 2016-12-11 (×2): 10 mg via ORAL
  Filled 2016-12-09 (×3): qty 1

## 2016-12-09 MED ORDER — LEVOTHYROXINE SODIUM 150 MCG PO TABS
300.0000 ug | ORAL_TABLET | Freq: Every day | ORAL | Status: DC
  Administered 2016-12-10 – 2016-12-12 (×3): 300 ug via ORAL
  Filled 2016-12-09 (×3): qty 2
  Filled 2016-12-09: qty 4
  Filled 2016-12-09: qty 2

## 2016-12-09 MED ORDER — DILTIAZEM HCL ER COATED BEADS 240 MG PO CP24
240.0000 mg | ORAL_CAPSULE | Freq: Every day | ORAL | Status: DC
  Administered 2016-12-10 – 2016-12-12 (×3): 240 mg via ORAL
  Filled 2016-12-09 (×3): qty 1

## 2016-12-09 MED ORDER — MOMETASONE FURO-FORMOTEROL FUM 200-5 MCG/ACT IN AERO
2.0000 | INHALATION_SPRAY | Freq: Two times a day (BID) | RESPIRATORY_TRACT | Status: DC
  Administered 2016-12-10 – 2016-12-12 (×5): 2 via RESPIRATORY_TRACT
  Filled 2016-12-09: qty 8.8

## 2016-12-09 MED ORDER — ONDANSETRON HCL 4 MG/2ML IJ SOLN: 4.0000 mg | Freq: Four times a day (QID) | INTRAMUSCULAR | Status: DC | PRN

## 2016-12-09 MED ORDER — ONDANSETRON HCL 4 MG PO TABS: 4.0000 mg | ORAL_TABLET | Freq: Four times a day (QID) | ORAL | Status: DC | PRN

## 2016-12-09 MED ORDER — PANTOPRAZOLE SODIUM 40 MG PO TBEC
80.0000 mg | DELAYED_RELEASE_TABLET | Freq: Every day | ORAL | Status: DC
  Administered 2016-12-10 – 2016-12-12 (×3): 80 mg via ORAL
  Filled 2016-12-09 (×3): qty 2

## 2016-12-09 MED ORDER — SODIUM CHLORIDE 0.9 % IV SOLN
INTRAVENOUS | Status: DC
  Administered 2016-12-10 – 2016-12-11 (×3): via INTRAVENOUS

## 2016-12-09 MED ORDER — TETANUS-DIPHTH-ACELL PERTUSSIS 5-2.5-18.5 LF-MCG/0.5 IM SUSP
0.5000 mL | Freq: Once | INTRAMUSCULAR | Status: AC
  Administered 2016-12-09: 0.5 mL via INTRAMUSCULAR
  Filled 2016-12-09: qty 0.5

## 2016-12-09 MED ORDER — SODIUM CHLORIDE 0.9 % IV BOLUS (SEPSIS)
2000.0000 mL | Freq: Once | INTRAVENOUS | Status: AC
  Administered 2016-12-10: 2000 mL via INTRAVENOUS

## 2016-12-09 MED ORDER — NEOMYCIN-BACITRACIN ZN-POLYMYX 5-400-10000 OP OINT: 1.0000 "application " | TOPICAL_OINTMENT | OPHTHALMIC | Status: DC | PRN

## 2016-12-09 MED ORDER — APIXABAN 2.5 MG PO TABS
2.5000 mg | ORAL_TABLET | Freq: Every day | ORAL | Status: DC
  Administered 2016-12-10 – 2016-12-12 (×3): 2.5 mg via ORAL
  Filled 2016-12-09 (×3): qty 1

## 2016-12-09 MED ORDER — BACITRACIN-NEOMYCIN-POLYMYXIN OINTMENT TUBE
TOPICAL_OINTMENT | Freq: Two times a day (BID) | CUTANEOUS | Status: DC
  Administered 2016-12-10 – 2016-12-12 (×6): via TOPICAL
  Filled 2016-12-09 (×2): qty 14.17

## 2016-12-09 MED ORDER — TRAMADOL HCL 50 MG PO TABS
50.0000 mg | ORAL_TABLET | Freq: Three times a day (TID) | ORAL | Status: DC | PRN
  Administered 2016-12-10: 50 mg via ORAL
  Filled 2016-12-09: qty 1

## 2016-12-09 MED ORDER — ALBUTEROL SULFATE (2.5 MG/3ML) 0.083% IN NEBU
2.5000 mg | INHALATION_SOLUTION | Freq: Four times a day (QID) | RESPIRATORY_TRACT | Status: DC | PRN
  Administered 2016-12-10 – 2016-12-11 (×4): 2.5 mg via RESPIRATORY_TRACT
  Filled 2016-12-09 (×5): qty 3

## 2016-12-09 NOTE — H&P (Signed)
History and Physical    [name] JSE:[id number] DOB: [birth year] DOA: 12/09/2016  Referring MD/NP/PA: Deno Etienne, MD  PCP: Debbrah Alar, NP  Patient coming from: Home via EMS  Chief Complaint: Falls  HPI: [name] is a 61 y.o. female with medical history significant of HTN, HLD A. Fib on Xarelto, morbid obesity, H/O nephrolithiasis, osteoarthritis; who presents after having a fall at home unable to get herself up. The patient along with family present at bedside and provided additional history and stated that the patient has had at least 3 falls in the last 3 days. At baseline patient walks with a rolling walker. With one of the falls she tripped over the carpet,  fell backwards while using walker, and the most recent  fall was somehow wedged between her bed and nightstand unable to get herself up for 18 hours. Associated symptoms include nausea, loose stools, knee pain, flank pain, change in urine color, rash of groin, sacral wound, rug burns, shoulder pain, and increased lethargy. Denies any recent medication changes, antibiotics, trauma to her head, loss of consciousness, palpitations, chest pain, vomiting, bleeding, diarrhea, or constipation. Patient was found by EMS and noted to be covered in stool.  ED Course: Upon admission into the emergency department patient was seen to be afebrile, respirations 18-22, non-the vital signs within normal limits. Labs revealed WBC 14.9, potassium 3.1, calcium 7.5, BUN 19, creatinine 0.88, and CPK 958. Imaging studies show no acute fractures or intracranial abnormality. Urinalysis showed large hemoglobin and protein. Patient was given 1 L of IV fluids initially and approximately 6 hours later given 2 more liters. Patient also received 2 diabetes and Tylenol. The patient's sister in law also makes note that the patient has had visual hallucinations since being here in the ED.  Review of Systems: Review of Systems  Constitutional: Positive for  chills. Negative for fever.  HENT: Negative for congestion and nosebleeds.   Eyes: Negative for pain and discharge.  Respiratory: Negative for cough and sputum production.   Cardiovascular: Negative for chest pain and palpitations.  Gastrointestinal: Positive for nausea. Negative for blood in stool, constipation, melena and vomiting.  Genitourinary: Negative for dysuria and urgency.  Musculoskeletal: Positive for back pain, falls and joint pain.  Skin: Positive for rash. Negative for itching.  Neurological: Positive for weakness. Negative for sensory change and seizures.  Endo/Heme/Allergies: Negative for environmental allergies and polydipsia.  Psychiatric/Behavioral: Positive for hallucinations. Negative for memory loss and suicidal ideas.    Past Medical History:  Diagnosis Date  . Abnormal Pap smear    years ago/no biopsy  . Allergic rhinitis   . Anemia, iron deficiency   . Arthritis    back- lower  . Asthma   . Atrial fibrillation ([place]) August 2012   OFF XARELTO LAST MONTH DUE TO BLEEDING IN STOOL  . Bursitis of hip left  . Dysrhythmia    afib, followed by Dr. Stanford Breed   . Edema of both legs   . Fatty liver 01/07/2012  . Fibroid 1974   fibroid cyst on left fallopian tube  . Fibromyalgia   . Foot ulcer ([place])    AREA HEALED RIGHT FOOT  . GERD (gastroesophageal reflux disease)   . Headache(784.0)    occasional sinus headache   . History of blood transfusion JULY 2015  . Hypertension   . Hypoparathyroidism ([place]) 01/07/2011  . Hypothyroidism   . Kidney stone 09/29/15   passed on their own  . Morbid obesity ([place])   .  Nephrolithiasis 2/16, 9/16   SEES DR Risa Grill  . Pernicious anemia 02/20/2014   followed by Debbrah Alar  . PONV (postoperative nausea and vomiting)   . Seizures ([place])    infancy secondary to fever  . Thyroid cancer ([place]) 2011   THYROIDECTOMY DONE  . Uterine cancer ([place]) 2014   Mirena IUD  . Vitamin D deficiency     Past Surgical History:    Procedure Laterality Date  . AMPUTATION Right 05/18/2014   Procedure: RIGHT FIFTH RAY AMPUTATION FOOT;  Surgeon: Wylene Simmer, MD;  Location: [facility];  Service: Orthopedics;  Laterality: Right;  . APPENDECTOMY    . BUNIONECTOMY     bilateral  . COLONOSCOPY WITH PROPOFOL N/A 02/02/2014   Procedure: COLONOSCOPY WITH PROPOFOL;  Surgeon: Irene Shipper, MD;  Location: [facility];  Service: Endoscopy;  Laterality: N/A;  . cyst on ovary removed     . CYSTOSCOPY W/ RETROGRADES  03/03/2012   Procedure: CYSTOSCOPY WITH RETROGRADE PYELOGRAM;  Surgeon: Bernestine Amass, MD;  Location: [facility];  Service: Urology;  Laterality: Bilateral;  . CYSTOSCOPY WITH RETROGRADE PYELOGRAM, URETEROSCOPY AND STENT PLACEMENT Left 08/25/2012   Procedure: CYSTOSCOPY WITH RETROGRADE PYELOGRAM, URETEROSCOPY;  Surgeon: Bernestine Amass, MD;  Location: [facility];  Service: Urology;  Laterality: Left;  . DILATION AND CURETTAGE OF UTERUS N/A 02/21/2013   Procedure: DILATATION AND CURETTAGE;  Surgeon: Lyman Speller, MD;  Location: [facility];  Service: Gynecology;  Laterality: N/A;  . DILATION AND CURETTAGE OF UTERUS N/A 04/09/2015   Procedure: [place] IUD removal;  Surgeon: Megan Salon, MD;  Location: [facility];  Service: Gynecology;  Laterality: N/A;  Patient weight 307lbs  . ESOPHAGOGASTRODUODENOSCOPY (EGD) WITH PROPOFOL N/A 02/02/2014   Procedure: ESOPHAGOGASTRODUODENOSCOPY (EGD) WITH PROPOFOL;  Surgeon: Irene Shipper, MD;  [facility];  Service: Endoscopy;  Laterality: N/A;  . GASTRIC BYPASS  1974  . HOLMIUM LASER APPLICATION Left 06/26/9756   Procedure: HOLMIUM LASER APPLICATION;  Surgeon: Bernestine Amass, MD;  Location: [facility];  Service: Urology;  Laterality: Left;  . LITHOTRIPSY  03/2012  . LITHOTRIPSY  2/16  . THYROIDECTOMY  05/15/2010  . TONSILLECTOMY AND ADENOIDECTOMY    . URETEROSCOPY  03/03/2012   Procedure: URETEROSCOPY;  Surgeon: Bernestine Amass, MD;  Location: [facility];  Service: Urology;  Laterality:  Left;     reports that she quit smoking about 21 years ago. Her smoking use included Cigarettes. She started smoking about 45 years ago. She has a 12.50 pack-year smoking history. She has never used smokeless tobacco. She reports that she drinks alcohol. She reports that she does not use drugs.  Allergies  Allergen Reactions  . Other Anaphylaxis    NUTS  . Amoxicillin-Pot Clavulanate Other (See Comments)    headache  . Food Hives    Potato  . Penicillins Other (See Comments)    Headache. Has patient had a PCN reaction causing immediate rash, facial/tongue/throat swelling, SOB or lightheadedness with hypotension: No Has patient had a PCN reaction causing severe rash involving mucus membranes or skin necrosis: No Has patient had a PCN reaction that required hospitalization: No Has patient had a PCN reaction occurring within the last 10 years: Yes If all of the above answers are "NO", then may proceed with Cephalosporin use.   . Tomato Hives    Family History  Problem Relation Age of Onset  . Hypertension Father   . Diabetes Father   . Lung cancer Father   .  Heart attack Father        MI at age 2  . Hypertension Mother   . Hyperthyroidism Mother   . Heart disease Mother   . Heart attack Mother        MI at age 46  . Asthma Brother   . Hypertension Brother        younger  . Heart disease Brother        older  . Colon cancer Neg Hx   . Esophageal cancer Neg Hx   . Stomach cancer Neg Hx   . Kidney disease Neg Hx   . Liver disease Neg Hx     Prior to Admission medications   Medication Sig Start Date End Date Taking? Authorizing Provider  acetaminophen (TYLENOL) 325 MG tablet Take 2 tablets (650 mg total) by mouth every 6 (six) hours as needed for mild pain or moderate pain. 12/08/16  Yes Waynetta Pean, PA-C  albuterol (PROVENTIL HFA;VENTOLIN HFA) 108 (90 Base) MCG/ACT inhaler Inhale 2 puffs into the lungs every 6 (six) hours as needed for wheezing or shortness of breath.  11/10/16  Yes Debbrah Alar, NP  albuterol (PROVENTIL) (2.5 MG/3ML) 0.083% nebulizer solution INHALE 1 VIAL VIA NEBULIZER EVERY 4 HOURS AS NEEDED WHEEZING 07/30/15  Yes Debbrah Alar, NP  apixaban (ELIQUIS) 5 MG TABS tablet Take 1 tablet (5 mg total) by mouth 2 (two) times daily. Patient taking differently: Take 2.5 mg by mouth daily.  06/17/16  Yes Lelon Perla, MD  calcitRIOL (ROCALTROL) 0.5 MCG capsule Take 1.5 mcg by mouth daily.    Yes [provider]  Cyanocobalamin (VITAMIN B-12 IJ) Inject as directed every 30 (thirty) days.   Yes [provider]  cyclobenzaprine (FLEXERIL) 10 MG tablet TAKE 1 TABLET BY MOUTH  EVERY NIGHT AT BEDTIME AS  NEEDED Patient taking differently: TAKE 10MG  BY MOUTH  EVERY NIGHT AT BEDTIME AS  NEEDED FOR MUSCLE SPASMS/ACHES 06/25/16  Yes Panwala, Naitik, PA-C  Diclofenac Sodium (PENNSAID) 2 % SOLN Place 2 Squirts onto the skin 2 (two) times daily. 08/20/16  Yes Meredith Pel, MD  diltiazem (CARDIZEM CD) 240 MG 24 hr capsule TAKE 1 CAPSULE BY MOUTH  DAILY Patient taking differently: TAKE 240MG  BY MOUTH  DAILY 09/03/16  Yes Debbrah Alar, NP  DIPHENHYDRAMINE-PSEUDOEPHED PO Take 1 tablet by mouth every 6 (six) hours as needed (for allegies).   Yes [provider]  fluticasone (FLONASE) 50 MCG/ACT nasal spray Place 2 sprays into both nostrils daily as needed for allergies or rhinitis.    Yes [provider]  furosemide (LASIX) 40 MG tablet TAKE 1 TABLET BY MOUTH  DAILY Patient taking differently: TAKE 40MG  BY MOUTH  DAILY 05/28/16  Yes Debbrah Alar, NP  hydrochlorothiazide (HYDRODIURIL) 25 MG tablet TAKE 1 TABLET BY MOUTH  DAILY Patient taking differently: TAKE 25MG  BY MOUTH  DAILY 10/29/16  Yes Debbrah Alar, NP  levocetirizine (XYZAL) 5 MG tablet TAKE 1 TABLET BY MOUTH  EVERY EVENING Patient taking differently: TAKE 5MG  BY MOUTH  EVERY EVENING 08/04/16  Yes Debbrah Alar, NP  levothyroxine  (SYNTHROID, LEVOTHROID) 100 MCG tablet Take 300 mcg by mouth daily.  04/10/16  Yes [provider]  MANGANESE PO Take 1,250 mg by mouth 2 (two) times daily.   Yes [provider]  methocarbamol (ROBAXIN) 500 MG tablet TAKE 1 TABLET BY MOUTH AT  7AM AND AT 2PM AS NEEDED Patient taking differently: TAKE 1 TABLET BY MOUTH TWICE DAILY AS NEEDED MUSCLE SPASMS/ACHES 06/25/16  Yes Panwala, Naitik, PA-C  neomycin-bacitracin-polymyxin (NEOSPORIN) ointment Apply 1 application topically as needed for wound care. apply to eye   Yes [provider]  nystatin cream (MYCOSTATIN) APPLY TO THE AFFECTED AREA TWICE DAILY FOR UP TO 7 DAYS 11/07/16  Yes Debbrah Alar, NP  omeprazole (PRILOSEC) 40 MG capsule TAKE 1 CAPSULE BY MOUTH  DAILY Patient taking differently: TAKE 40MG  BY MOUTH  DAILY 08/25/16  Yes Debbrah Alar, NP  SYMBICORT 160-4.5 MCG/ACT inhaler USE 2 PUFFS TWO TIMES DAILY 10/29/16  Yes Debbrah Alar, NP  traMADol (ULTRAM) 50 MG tablet TAKE 1 TABLET BY MOUTH EVERY 6 HOURS AS NEEDED FOR MODERATE PAIN Patient taking differently: Take 50 mg by mouth every 6 (six) hours as needed for moderate pain.  09/02/16  Yes Debbrah Alar, NP  valsartan (DIOVAN) 80 MG tablet TAKE 1 TABLET BY MOUTH  DAILY Patient taking differently: TAKE 80MG  BY MOUTH  DAILY 08/04/16  Yes Debbrah Alar, NP  Vitamin D, Ergocalciferol, (DRISDOL) 50000 units CAPS capsule TAKE 1 CAPSULE BY MOUTH 3  TIMES WEEKLY Patient taking differently: TAKE 1 CAPSULE BY MOUTH 4 TIMES WEEKLY 09/03/16  Yes Debbrah Alar, NP  zafirlukast (ACCOLATE) 20 MG tablet TAKE 1 TABLET BY MOUTH 2  TIMES DAILY BEFORE MEALS Patient taking differently: TAKE 20MG  BY MOUTH TWICE DAILY BEFORE MEALS 08/18/16  Yes Debbrah Alar, NP  desoximetasone (TOPICORT) 0.05 % cream Apply topically 2 (two) times daily. Patient not taking: Reported on 12/09/2016 09/18/15   Debbrah Alar, NP    Physical  Exam:  Constitutional:Morbid obesity, NAD, calm, comfortable Vitals:   12/09/16 1548 12/09/16 1600 12/09/16 1837 12/09/16 2131  BP: 129/63 114/63 127/66 121/63  Pulse: 72 72 77 89  Resp: (!) 21 18 (!) 22 (!) 22  Temp: 98.2 F (36.8 C)   98.6 F (37 C)  TempSrc: Oral   Oral  SpO2: 100% 96% 100% 94%   Eyes: PERRL, lids and conjunctivae normal ENMT: Mucous membranes are moist. Posterior pharynx clear of any exudate or lesions.Normal dentition.  Neck: normal, supple, no masses, no thyromegaly Respiratory: clear to auscultation bilaterally, no wheezing, no crackles. Normal respiratory effort. No accessory muscle use.  Cardiovascular: Regular rate and rhythm, no murmurs / rubs / gallops. No extremity edema. 2+ pedal pulses. No carotid bruits.  Abdomen: Positive CVA tenderness on the right flank, no masses palpated. No hepatosplenomegaly. Bowel sounds positive.  Musculoskeletal: no clubbing / cyanosis. No joint deformity upper and lower extremities. Good ROM, no contractures. Normal muscle tone.  Skin: Sacral decubitus ulcer present with multiple lower extremity skin tears and what appear ton be rug burns. Neurologic: CN 2-12 grossly intact. Sensation intact, DTR normal. Strength 4/5 in all 4.  Psychiatric: Normal judgment and insight. Alert and oriented x 3. Normal mood.     Labs on Admission: I have personally reviewed following labs and imaging studies  CBC:  Recent Labs Lab 12/09/16 1922 12/09/16 1937  WBC 14.9*  --   NEUTROABS 12.8*  --   HGB 13.0 13.9  HCT 40.3 41.0  MCV 83.1  --   PLT 180  --    Basic Metabolic Panel:  Recent Labs Lab 12/09/16 1922 12/09/16 1937  NA 136 140  K 3.1* 3.2*  CL 102 101  CO2 24  --   GLUCOSE 105* 99  BUN 19 20  CREATININE 0.88 0.80  CALCIUM 7.5*  --    GFR: Estimated Creatinine Clearance: 106.5 mL/min (by C-G formula based on SCr of 0.8 mg/dL). Liver  Function Tests:  Recent Labs Lab 12/09/16 1922  AST 35  ALT 32  ALKPHOS  79  BILITOT 1.0  PROT 6.4*  ALBUMIN 3.0*   No results for input(s): LIPASE, AMYLASE in the last 168 hours. No results for input(s): AMMONIA in the last 168 hours. Coagulation Profile: No results for input(s): INR, PROTIME in the last 168 hours. Cardiac Enzymes:  Recent Labs Lab 12/09/16 1922  CKTOTAL 958*   BNP (last 3 results) No results for input(s): PROBNP in the last 8760 hours. HbA1C: No results for input(s): HGBA1C in the last 72 hours. CBG: No results for input(s): GLUCAP in the last 168 hours. Lipid Profile: No results for input(s): CHOL, HDL, LDLCALC, TRIG, CHOLHDL, LDLDIRECT in the last 72 hours. Thyroid Function Tests:  Recent Labs  12/09/16 1922  TSH 1.394   Anemia Panel: No results for input(s): VITAMINB12, FOLATE, FERRITIN, TIBC, IRON, RETICCTPCT in the last 72 hours. Urine analysis:    Component Value Date/Time   COLORURINE AMBER (A) 12/09/2016 1935   APPEARANCEUR CLOUDY (A) 12/09/2016 1935   LABSPEC 1.027 12/09/2016 1935   LABSPEC 1.010 06/09/2007 1500   PHURINE 5.0 12/09/2016 1935   GLUCOSEU NEGATIVE 12/09/2016 1935   GLUCOSEU NEGATIVE 03/19/2016 1030   HGBUR LARGE (A) 12/09/2016 1935   HGBUR negative 05/09/2010 1026   BILIRUBINUR NEGATIVE 12/09/2016 1935   BILIRUBINUR N 05/01/2016 1104   BILIRUBINUR Negative 06/09/2007 1500   KETONESUR 20 (A) 12/09/2016 1935   PROTEINUR 100 (A) 12/09/2016 1935   UROBILINOGEN negative 05/01/2016 1104   UROBILINOGEN 0.2 03/19/2016 1030   NITRITE NEGATIVE 12/09/2016 1935   LEUKOCYTESUR NEGATIVE 12/09/2016 1935   LEUKOCYTESUR Negative 06/09/2007 1500   Sepsis Labs: No results found for this or any previous visit (from the past 240 hour(s)).   Radiological Exams on Admission: Dg Chest 2 View  Result Date: 12/09/2016 CLINICAL DATA:  Recent falls with chest pain, initial encounter EXAM: CHEST  2 VIEW COMPARISON:  08/19/2013 FINDINGS: The heart size and mediastinal contours are within normal limits. Both lungs  are clear. The visualized skeletal structures show degenerative change of the thoracic spine. IMPRESSION: No active cardiopulmonary disease. Electronically Signed   By: Inez Catalina M.D.   On: 12/09/2016 18:28   Dg Pelvis 1-2 Views  Result Date: 12/09/2016 CLINICAL DATA:  Recent falls with pelvic pain, initial encounter EXAM: PELVIS - 1-2 VIEW COMPARISON:  None. FINDINGS: Pelvic ring is intact. An IUD is noted in place. No definitive fracture or dislocation is seen. Degenerative changes in the lumbar spine and hip joints are noted. IMPRESSION: No acute abnormality noted. Electronically Signed   By: Inez Catalina M.D.   On: 12/09/2016 18:30   Ct Head Wo Contrast  Result Date: 12/09/2016 CLINICAL DATA:  Multiple falls are Eliquis.  Initial encounter. EXAM: CT HEAD WITHOUT CONTRAST TECHNIQUE: Contiguous axial images were obtained from the base of the skull through the vertex without intravenous contrast. COMPARISON:  None. FINDINGS: Brain: No evidence of acute infarction, hemorrhage, hydrocephalus, extra-axial collection or mass lesion/mass effect. Vascular: Arterial calcification.  No hyperdense vessel. Skull: No acute or aggressive finding.  Incidental hyperostosis. Sinuses/Orbits: Negative IMPRESSION: Negative head CT. Electronically Signed   By: Monte Fantasia M.D.   On: 12/09/2016 17:00   Dg Shoulder Left  Result Date: 12/08/2016 CLINICAL DATA:  Fall with left shoulder injury.  Initial encounter. EXAM: LEFT SHOULDER - 2+ VIEW COMPARISON:  None. FINDINGS: There is no evidence of fracture or dislocation. Moderate degenerative disease is seen involving  the [facility] joint. No bony lesions or destruction. Soft tissues are unremarkable. IMPRESSION: No acute findings.  Moderate degenerative disease of the [facility] joint. Electronically Signed   By: Aletta Edouard M.D.   On: 12/08/2016 12:01   Dg Knee Complete 4 Views Left  Result Date: 12/09/2016 CLINICAL DATA:  Multiple recent falls with knee pain, initial  encounter EXAM: LEFT KNEE - COMPLETE 4+ VIEW COMPARISON:  None. FINDINGS: Mild degenerative changes are noted primarily within the medial joint space. No joint effusion is seen. No acute fracture or dislocation is noted. IMPRESSION: Degenerative change without acute abnormality. Electronically Signed   By: Inez Catalina M.D.   On: 12/09/2016 18:29   Dg Knee Complete 4 Views Right  Result Date: 12/09/2016 CLINICAL DATA:  Multiple recent falls with right knee pain, initial encounter EXAM: RIGHT KNEE - COMPLETE 4+ VIEW COMPARISON:  None. FINDINGS: Tricompartmental degenerative change is noted. No acute fracture or dislocation is seen. No joint effusion is noted. No soft tissue abnormality is seen. IMPRESSION: Degenerative change without acute abnormality. Electronically Signed   By: Inez Catalina M.D.   On: 12/09/2016 18:29    EKG: Independently reviewed. Sinus rhythm  Assessment/Plan Recurrent Falls: Patient with recurrent falls which appeared to multifactorial in nature. Suspect related to polypharmacy, overdiuresis, and/or deconditioning given body habitus. - Admit to a telemetry bed  - Check orthostatic vital signs - Hold medications patient at risk for falls including Flexeril, Robaxin, Dramamine-pseudoephedrine pseudoephedrine   - Decreased frequency of tramadol to every 8 hours prn pain - Physical therapy to eval and treat  Leukocytosis: Acute. WBC elevated at 14.9. This could be reluctant to above. Urinalysis and chest x-ray showing no acute signs of infection.  Dehydration: Acute. Patient have elevated BUN to creatinine ratio suggests signs of dehydration. The patient was given a total of 3 L of normal saline IV fluids in the ED. -  Hold diuretics overnight, may warrant dose adjustment   Elevated CPK/early rhabdomyolysis: Initial CPK elevated at 982 and urinalysis showing significant signs of protein / blood on admission suggestive of signs of early rhabdomyolysis. - IV fluids of normal  saline as tolerated overnight - Recheck CPK in a.m.  Left flank pain: Patient reports left-sided flank pain and previous history of nephrolithiasis. - Check CT scan of the abdomen  Essential hypertension: Patient on hydrochlorothiazide and furosemide likely adding to symptoms - Continue pharmacy substitution of irbesartan for Diovan and Cardizem - Held Furosemide and hydrochlorothiazide   Muscle cramps secondary to Hypokalemia and hypocalcemia: Acute. Patient with potassium 3.1 on admission and calcium 7.5.  - Give 40 mEq of potassium chloride by mouth - Gave 1 g of calcium gluconate IV - Check magnesium level in a.m. - Continue to monitor place  History of asthma - Continue pharmacy substitution of Singulair for Accolote, Dulera for Symbicort, albuterol prn sob/wheezing  Paroxysmal Atrial fibrillation: Patient with Chadsvasc score >2. - Eliqius and Cardizem   Morbid obesity: BMI 53.3  Hypothyroidism and hypoparathyroidism: TSH 1.394 on admission - Continue levothyroxine and Calcitrol  DVT prophylaxis: Eliquis Code Status: full Family Communication: Discussed plan of care the patient and family present at bedside  Disposition Plan: TBD Consults called: None  Admission status: Observation  Norval Morton MD [facility] Pager [phone]  If 7PM-7AM, please contact night-coverage [email or web address] Password [email or web address]  12/09/2016, 10:11 PM

## 2016-12-09 NOTE — ED Notes (Signed)
Pt placed on bedpan

## 2016-12-09 NOTE — ED Provider Notes (Signed)
[facility] DEPT Provider Note   CSN: [id number] Arrival date & time: 12/09/16  1503     History   Chief Complaint Chief Complaint  Patient presents with  . Fall    HPI [name] is a 61 y.o. female.  61 yo F With a chief complaints of a fall. This is the seventh one in the past 3 months. I asked the patient why she is so weak and she says that she just says it has been going on for 8-12 months. She has seen her family doctor multiple times for same. Has a diagnosis of B12 deficiency as well as iron deficiency anemia and gets iron infusions.  She tripped last night over a rug and landed on the ground. She is unable to get up off the floor. On the ground for about 12 hours or so. She denies any area of pain until I palpate in different areas. She denies head injury or loss consciousness. Denies neck pain or back pain.    The history is provided by the patient.  Fall  This is a new problem. The current episode started less than 1 hour ago. The problem occurs constantly. The problem has not changed since onset.Pertinent negatives include no chest pain, no headaches and no shortness of breath. Nothing aggravates the symptoms. Nothing relieves the symptoms. She has tried nothing for the symptoms. The treatment provided no relief.    Past Medical History:  Diagnosis Date  . Abnormal Pap smear    years ago/no biopsy  . Allergic rhinitis   . Anemia, iron deficiency   . Arthritis    back- lower  . Asthma   . Atrial fibrillation ([place]) August 2012   OFF XARELTO LAST MONTH DUE TO BLEEDING IN STOOL  . Bursitis of hip left  . Dysrhythmia    afib, followed by Dr. Stanford Breed   . Edema of both legs   . Fatty liver 01/07/2012  . Fibroid 1974   fibroid cyst on left fallopian tube  . Fibromyalgia   . Foot ulcer ([place])    AREA HEALED RIGHT FOOT  . GERD (gastroesophageal reflux disease)   . Headache(784.0)    occasional sinus headache   . History of blood transfusion JULY 2015  .  Hypertension   . Hypoparathyroidism ([place]) 01/07/2011  . Hypothyroidism   . Kidney stone 08-Oct-2015   passed on their own  . Morbid obesity ([place])   . Nephrolithiasis 2/16, 9/16   SEES DR Risa Grill  . Pernicious anemia 02/20/2014   followed by Debbrah Alar  . PONV (postoperative nausea and vomiting)   . Seizures ([place])    infancy secondary to fever  . Thyroid cancer ([place]) 2011   THYROIDECTOMY DONE  . Uterine cancer ([place]) 2014   Mirena IUD  . Vitamin D deficiency     Patient Active Problem List   Diagnosis Date Noted  . Recurrent falls 12/09/2016  . Ureteral calculus, right 07/17/2014  . Osteomyelitis ([place]) 06/14/2014  . Pernicious anemia 02/20/2014  . Reflux esophagitis 02/02/2014  . Edema 11/23/2013  . Foot pain 03/31/2013  . Endometrial adenocarcinoma ([place]) 02/21/2013  . Trochanteric bursitis of left hip 09/13/2012  . Fatty liver 01/07/2012  . Mid back pain 09/30/2011  . Weight gain 07/02/2011  . Nonspecific abnormal unspecified cardiovascular function study 04/30/2011  . Hx of papillary thyroid carcinoma 04/07/2011  . Low back pain 04/01/2011  . Atrial fibrillation ([place]) 02/05/2011  . Hypoparathyroidism ([place]) 01/07/2011  . GERD 05/22/2009  .  LEUKOCYTOSIS 03/24/2009  . OBESITY, MORBID 12/20/2008  . RHINOSINUSITIS, RECURRENT 09/07/2008  . Vitamin D deficiency 05/10/2007  . Iron deficiency anemia secondary to malabsorption  05/10/2007  . HYPOTHYROIDISM 01/05/2007  . Essential hypertension 01/05/2007  . ALLERGIC RHINITIS 01/05/2007  . Asthma 01/05/2007    Past Surgical History:  Procedure Laterality Date  . AMPUTATION Right 05/18/2014   Procedure: RIGHT FIFTH RAY AMPUTATION FOOT;  Surgeon: Wylene Simmer, MD;  Location: [facility];  Service: Orthopedics;  Laterality: Right;  . APPENDECTOMY    . BUNIONECTOMY     bilateral  . COLONOSCOPY WITH PROPOFOL N/A 02/02/2014   Procedure: COLONOSCOPY WITH PROPOFOL;  Surgeon: Irene Shipper, MD;  Location: [facility];  Service:  Endoscopy;  Laterality: N/A;  . cyst on ovary removed     . CYSTOSCOPY W/ RETROGRADES  03/03/2012   Procedure: CYSTOSCOPY WITH RETROGRADE PYELOGRAM;  Surgeon: Bernestine Amass, MD;  Location: [facility];  Service: Urology;  Laterality: Bilateral;  . CYSTOSCOPY WITH RETROGRADE PYELOGRAM, URETEROSCOPY AND STENT PLACEMENT Left 08/25/2012   Procedure: CYSTOSCOPY WITH RETROGRADE PYELOGRAM, URETEROSCOPY;  Surgeon: Bernestine Amass, MD;  Location: [facility];  Service: Urology;  Laterality: Left;  . DILATION AND CURETTAGE OF UTERUS N/A 02/21/2013   Procedure: DILATATION AND CURETTAGE;  Surgeon: Lyman Speller, MD;  Location: [facility];  Service: Gynecology;  Laterality: N/A;  . DILATION AND CURETTAGE OF UTERUS N/A 04/09/2015   Procedure: [place] IUD removal;  Surgeon: Megan Salon, MD;  Location: [facility];  Service: Gynecology;  Laterality: N/A;  Patient weight 307lbs  . ESOPHAGOGASTRODUODENOSCOPY (EGD) WITH PROPOFOL N/A 02/02/2014   Procedure: ESOPHAGOGASTRODUODENOSCOPY (EGD) WITH PROPOFOL;  Surgeon: Irene Shipper, MD;  [facility];  Service: Endoscopy;  Laterality: N/A;  . GASTRIC BYPASS  1974  . HOLMIUM LASER APPLICATION Left 01/31/1190   Procedure: HOLMIUM LASER APPLICATION;  Surgeon: Bernestine Amass, MD;  Location[facility];  Service: Urology;  Laterality: Left;  . LITHOTRIPSY  03/2012  . LITHOTRIPSY  2/16  . THYROIDECTOMY  05/15/2010  . TONSILLECTOMY AND ADENOIDECTOMY    . URETEROSCOPY  03/03/2012   Procedure: URETEROSCOPY;  Surgeon: Bernestine Amass, MD;  Location: [facility];  Service: Urology;  Laterality: Left;    OB History    Gravida Para Term Preterm AB Living   0 0 0 0 0 0   SAB TAB Ectopic Multiple Live Births   0 0 0 0         Home Medications    Prior to Admission medications   Medication Sig Start Date End Date Taking? Authorizing Provider  acetaminophen (TYLENOL) 325 MG tablet Take 2 tablets (650 mg total) by mouth every 6 (six) hours as needed for mild pain or  moderate pain. 12/08/16  Yes Waynetta Pean, PA-C  albuterol (PROVENTIL HFA;VENTOLIN HFA) 108 (90 Base) MCG/ACT inhaler Inhale 2 puffs into the lungs every 6 (six) hours as needed for wheezing or shortness of breath. 11/10/16  Yes Debbrah Alar, NP  albuterol (PROVENTIL) (2.5 MG/3ML) 0.083% nebulizer solution INHALE 1 VIAL VIA NEBULIZER EVERY 4 HOURS AS NEEDED WHEEZING 07/30/15  Yes Debbrah Alar, NP  apixaban (ELIQUIS) 5 MG TABS tablet Take 1 tablet (5 mg total) by mouth 2 (two) times daily. Patient taking differently: Take 2.5 mg by mouth daily.  06/17/16  Yes Lelon Perla, MD  calcitRIOL (ROCALTROL) 0.5 MCG capsule Take 1.5 mcg by mouth daily.    Yes [provider]  Cyanocobalamin (VITAMIN B-12 IJ) Inject  as directed every 30 (thirty) days.   Yes [provider]  cyclobenzaprine (FLEXERIL) 10 MG tablet TAKE 1 TABLET BY MOUTH  EVERY NIGHT AT BEDTIME AS  NEEDED Patient taking differently: TAKE 10MG  BY MOUTH  EVERY NIGHT AT BEDTIME AS  NEEDED FOR MUSCLE SPASMS/ACHES 06/25/16  Yes Panwala, Naitik, PA-C  Diclofenac Sodium (PENNSAID) 2 % SOLN Place 2 Squirts onto the skin 2 (two) times daily. 08/20/16  Yes Meredith Pel, MD  diltiazem (CARDIZEM CD) 240 MG 24 hr capsule TAKE 1 CAPSULE BY MOUTH  DAILY Patient taking differently: TAKE 240MG  BY MOUTH  DAILY 09/03/16  Yes Debbrah Alar, NP  DIPHENHYDRAMINE-PSEUDOEPHED PO Take 1 tablet by mouth every 6 (six) hours as needed (for allegies).   Yes [provider]  fluticasone (FLONASE) 50 MCG/ACT nasal spray Place 2 sprays into both nostrils daily as needed for allergies or rhinitis.    Yes [provider]  furosemide (LASIX) 40 MG tablet TAKE 1 TABLET BY MOUTH  DAILY Patient taking differently: TAKE 40MG  BY MOUTH  DAILY 05/28/16  Yes Debbrah Alar, NP  hydrochlorothiazide (HYDRODIURIL) 25 MG tablet TAKE 1 TABLET BY MOUTH  DAILY Patient taking differently: TAKE 25MG  BY MOUTH  DAILY 10/29/16  Yes  Debbrah Alar, NP  levocetirizine (XYZAL) 5 MG tablet TAKE 1 TABLET BY MOUTH  EVERY EVENING Patient taking differently: TAKE 5MG  BY MOUTH  EVERY EVENING 08/04/16  Yes Debbrah Alar, NP  levothyroxine (SYNTHROID, LEVOTHROID) 100 MCG tablet Take 300 mcg by mouth daily.  04/10/16  Yes [provider]  MANGANESE PO Take 1,250 mg by mouth 2 (two) times daily.   Yes [provider]  methocarbamol (ROBAXIN) 500 MG tablet TAKE 1 TABLET BY MOUTH AT  7AM AND AT 2PM AS NEEDED Patient taking differently: TAKE 1 TABLET BY MOUTH TWICE DAILY AS NEEDED MUSCLE SPASMS/ACHES 06/25/16  Yes Panwala, Naitik, PA-C  neomycin-bacitracin-polymyxin (NEOSPORIN) ointment Apply 1 application topically as needed for wound care. apply to eye   Yes [provider]  nystatin cream (MYCOSTATIN) APPLY TO THE AFFECTED AREA TWICE DAILY FOR UP TO 7 DAYS 11/07/16  Yes Debbrah Alar, NP  omeprazole (PRILOSEC) 40 MG capsule TAKE 1 CAPSULE BY MOUTH  DAILY Patient taking differently: TAKE 40MG  BY MOUTH  DAILY 08/25/16  Yes Debbrah Alar, NP  SYMBICORT 160-4.5 MCG/ACT inhaler USE 2 PUFFS TWO TIMES DAILY 10/29/16  Yes Debbrah Alar, NP  traMADol (ULTRAM) 50 MG tablet TAKE 1 TABLET BY MOUTH EVERY 6 HOURS AS NEEDED FOR MODERATE PAIN Patient taking differently: Take 50 mg by mouth every 6 (six) hours as needed for moderate pain.  09/02/16  Yes Debbrah Alar, NP  valsartan (DIOVAN) 80 MG tablet TAKE 1 TABLET BY MOUTH  DAILY Patient taking differently: TAKE 80MG  BY MOUTH  DAILY 08/04/16  Yes Debbrah Alar, NP  Vitamin D, Ergocalciferol, (DRISDOL) 50000 units CAPS capsule TAKE 1 CAPSULE BY MOUTH 3  TIMES WEEKLY Patient taking differently: TAKE 1 CAPSULE BY MOUTH 4 TIMES WEEKLY 09/03/16  Yes Debbrah Alar, NP  zafirlukast (ACCOLATE) 20 MG tablet TAKE 1 TABLET BY MOUTH 2  TIMES DAILY BEFORE MEALS Patient taking differently: TAKE 20MG  BY MOUTH TWICE DAILY BEFORE MEALS 08/18/16  Yes  Debbrah Alar, NP  desoximetasone (TOPICORT) 0.05 % cream Apply topically 2 (two) times daily. Patient not taking: Reported on 12/09/2016 09/18/15   Debbrah Alar, NP    Family History Family History  Problem Relation Age of Onset  . Hypertension Father   . Diabetes Father   .  Lung cancer Father   . Heart attack Father        MI at age 61  . Hypertension Mother   . Hyperthyroidism Mother   . Heart disease Mother   . Heart attack Mother        MI at age 18  . Asthma Brother   . Hypertension Brother        younger  . Heart disease Brother        older  . Colon cancer Neg Hx   . Esophageal cancer Neg Hx   . Stomach cancer Neg Hx   . Kidney disease Neg Hx   . Liver disease Neg Hx     Social History Social History  Substance Use Topics  . Smoking status: Former Smoker    Packs/day: 0.50    Years: 25.00    Types: Cigarettes    Start date: 12/24/1970    Quit date: 05/27/1995  . Smokeless tobacco: Never Used     Comment: quit smoking 19 years ago  . Alcohol use 0.0 oz/week     Comment: 1/2 glass per month     Allergies   Other; Amoxicillin-pot clavulanate; Food; Penicillins; and Tomato   Review of Systems Review of Systems  Constitutional: Negative for chills and fever.  HENT: Negative for congestion and rhinorrhea.   Eyes: Negative for redness and visual disturbance.  Respiratory: Negative for shortness of breath and wheezing.   Cardiovascular: Negative for chest pain and palpitations.  Gastrointestinal: Negative for nausea and vomiting.  Genitourinary: Negative for dysuria and urgency.  Musculoskeletal: Positive for arthralgias and myalgias.  Skin: Negative for pallor and wound.  Neurological: Negative for dizziness and headaches.     Physical Exam Updated Vital Signs BP 121/63 (BP Location: Left Arm)   Pulse 89   Temp 98.6 F (37 C) (Oral)   Resp (!) 22   LMP 06/11/2012   SpO2 94%   Physical Exam  Constitutional: She is oriented to person,  place, and time. She appears well-developed and well-nourished. No distress.  Obese, filthy, covered in feces.  HENT:  Head: Normocephalic and atraumatic.  Eyes: Pupils are equal, round, and reactive to light. EOM are normal.  Neck: Normal range of motion. Neck supple.  Cardiovascular: Normal rate and regular rhythm.  Exam reveals no gallop and no friction rub.   No murmur heard. Pulmonary/Chest: Effort normal. She has no wheezes. She has no rales.  Abdominal: Soft. She exhibits no distension and no mass. There is no tenderness. There is no guarding.  Obese  Musculoskeletal: She exhibits tenderness. She exhibits no edema.  Tenderness with palpation to bilateral knees with superficial abrasions noted.  Neurological: She is alert and oriented to person, place, and time.  Skin: Skin is warm and dry. She is not diaphoretic.  Psychiatric: She has a normal mood and affect. Her behavior is normal.  Nursing note and vitals reviewed.    ED Treatments / Results  Labs (all labs ordered are listed, but only abnormal results are displayed) Labs Reviewed  CBC WITH DIFFERENTIAL/PLATELET - Abnormal; Notable for the following:       Result Value   WBC 14.9 (*)    Neutro Abs 12.8 (*)    All other components within normal limits  URINALYSIS, ROUTINE W REFLEX MICROSCOPIC - Abnormal; Notable for the following:    Color, Urine AMBER (*)    APPearance CLOUDY (*)    Hgb urine dipstick LARGE (*)    Ketones, ur 20 (*)  Protein, ur 100 (*)    Bacteria, UA RARE (*)    All other components within normal limits  CK - Abnormal; Notable for the following:    Total CK 958 (*)    All other components within normal limits  COMPREHENSIVE METABOLIC PANEL - Abnormal; Notable for the following:    Potassium 3.1 (*)    Glucose, Bld 105 (*)    Calcium 7.5 (*)    Total Protein 6.4 (*)    Albumin 3.0 (*)    All other components within normal limits  I-STAT CHEM 8, ED - Abnormal; Notable for the following:     Potassium 3.2 (*)    Calcium, Ion 0.94 (*)    All other components within normal limits  TSH  T4  HIV ANTIBODY (ROUTINE TESTING)  MAGNESIUM  TROPONIN I  CBC  BASIC METABOLIC PANEL    EKG  EKG Interpretation  Date/Time:  Tuesday December 09 2016 15:46:34 EDT Ventricular Rate:  72 PR Interval:    QRS Duration: 89 QT Interval:  445 QTC Calculation: 487 R Axis:   38 Text Interpretation:  Sinus rhythm Borderline T abnormalities, anterior leads Borderline prolonged QT interval No old tracing to compare Confirmed by Deno Etienne [phone]) on 12/09/2016 3:55:00 PM       Radiology Dg Chest 2 View  Result Date: 12/09/2016 CLINICAL DATA:  Recent falls with chest pain, initial encounter EXAM: CHEST  2 VIEW COMPARISON:  08/19/2013 FINDINGS: The heart size and mediastinal contours are within normal limits. Both lungs are clear. The visualized skeletal structures show degenerative change of the thoracic spine. IMPRESSION: No active cardiopulmonary disease. Electronically Signed   By: Inez Catalina M.D.   On: 12/09/2016 18:28   Dg Pelvis 1-2 Views  Result Date: 12/09/2016 CLINICAL DATA:  Recent falls with pelvic pain, initial encounter EXAM: PELVIS - 1-2 VIEW COMPARISON:  None. FINDINGS: Pelvic ring is intact. An IUD is noted in place. No definitive fracture or dislocation is seen. Degenerative changes in the lumbar spine and hip joints are noted. IMPRESSION: No acute abnormality noted. Electronically Signed   By: Inez Catalina M.D.   On: 12/09/2016 18:30   Ct Head Wo Contrast  Result Date: 12/09/2016 CLINICAL DATA:  Multiple falls are Eliquis.  Initial encounter. EXAM: CT HEAD WITHOUT CONTRAST TECHNIQUE: Contiguous axial images were obtained from the base of the skull through the vertex without intravenous contrast. COMPARISON:  None. FINDINGS: Brain: No evidence of acute infarction, hemorrhage, hydrocephalus, extra-axial collection or mass lesion/mass effect. Vascular: Arterial calcification.  No  hyperdense vessel. Skull: No acute or aggressive finding.  Incidental hyperostosis. Sinuses/Orbits: Negative IMPRESSION: Negative head CT. Electronically Signed   By: Monte Fantasia M.D.   On: 12/09/2016 17:00   Dg Shoulder Left  Result Date: 12/08/2016 CLINICAL DATA:  Fall with left shoulder injury.  Initial encounter. EXAM: LEFT SHOULDER - 2+ VIEW COMPARISON:  None. FINDINGS: There is no evidence of fracture or dislocation. Moderate degenerative disease is seen involving the [facility] joint. No bony lesions or destruction. Soft tissues are unremarkable. IMPRESSION: No acute findings.  Moderate degenerative disease of the [facility] joint. Electronically Signed   By: Aletta Edouard M.D.   On: 12/08/2016 12:01   Dg Knee Complete 4 Views Left  Result Date: 12/09/2016 CLINICAL DATA:  Multiple recent falls with knee pain, initial encounter EXAM: LEFT KNEE - COMPLETE 4+ VIEW COMPARISON:  None. FINDINGS: Mild degenerative changes are noted primarily within the medial joint space. No joint effusion is  seen. No acute fracture or dislocation is noted. IMPRESSION: Degenerative change without acute abnormality. Electronically Signed   By: Inez Catalina M.D.   On: 12/09/2016 18:29   Dg Knee Complete 4 Views Right  Result Date: 12/09/2016 CLINICAL DATA:  Multiple recent falls with right knee pain, initial encounter EXAM: RIGHT KNEE - COMPLETE 4+ VIEW COMPARISON:  None. FINDINGS: Tricompartmental degenerative change is noted. No acute fracture or dislocation is seen. No joint effusion is noted. No soft tissue abnormality is seen. IMPRESSION: Degenerative change without acute abnormality. Electronically Signed   By: Inez Catalina M.D.   On: 12/09/2016 18:29    Procedures Procedures (including critical care time)  Medications Ordered in ED Medications  sodium chloride 0.9 % bolus 2,000 mL (not administered)  neomycin-bacitracin-polymyxin (NEOSPORIN) ointment 1 application (not administered)  acetaminophen (TYLENOL) tablet  650 mg (not administered)  albuterol (PROVENTIL HFA;VENTOLIN HFA) 108 (90 Base) MCG/ACT inhaler 2 puff (not administered)  nystatin cream (MYCOSTATIN) (not administered)  diltiazem (CARDIZEM CD) 24 hr capsule 240 mg (not administered)  pantoprazole (PROTONIX) EC tablet 80 mg (not administered)  Diclofenac Sodium 2 % SOLN 2 Squirt (not administered)  levocetirizine (XYZAL) tablet 5 mg (not administered)  apixaban (ELIQUIS) tablet 2.5 mg (not administered)  levothyroxine (SYNTHROID, LEVOTHROID) tablet 300 mcg (not administered)  irbesartan (AVAPRO) tablet 75 mg (not administered)  albuterol (PROVENTIL) (2.5 MG/3ML) 0.083% nebulizer solution 2.5 mg (not administered)  fluticasone (FLONASE) 50 MCG/ACT nasal spray 2 spray (not administered)  calcitRIOL (ROCALTROL) capsule 1.5 mcg (not administered)  montelukast (SINGULAIR) tablet 10 mg (not administered)  0.9 %  sodium chloride infusion (not administered)  ondansetron (ZOFRAN) tablet 4 mg (not administered)    Or  ondansetron (ZOFRAN) injection 4 mg (not administered)  acetaminophen (TYLENOL) tablet 650 mg (not administered)    Or  acetaminophen (TYLENOL) suppository 650 mg (not administered)  mometasone-formoterol (DULERA) 200-5 MCG/ACT inhaler 2 puff (not administered)  traMADol (ULTRAM) tablet 50 mg (not administered)  potassium chloride SA (K-DUR,KLOR-CON) CR tablet 40 mEq (not administered)  calcium gluconate 1 g in sodium chloride 0.9 % 100 mL IVPB (not administered)  sodium chloride 0.9 % bolus 1,000 mL (0 mLs Intravenous Stopped 12/09/16 2036)  Tdap (BOOSTRIX) injection 0.5 mL (0.5 mLs Intramuscular Given 12/09/16 1619)  acetaminophen (TYLENOL) tablet 1,000 mg (1,000 mg Oral Given 12/09/16 2034)     Initial Impression / Assessment and Plan / ED Course  I have reviewed the triage vital signs and the nursing notes.  Pertinent labs & imaging results that were available during my care of the patient were reviewed by me and considered in  my medical decision making (see chart for details).     61 yo F With a chief complaints of a fall. This is the seventh one in the past 3 months. 3rd in the last two weeks. I asked the patient why she is so weak and she says that she just says it has been going on for 8-12 months. She has seen her family doctor multiple times for same. Has a diagnosis of B12 deficiency as well as iron deficiency anemia and gets iron infusions.   We'll obtain laboratory evaluation or fluids, CT the head with recurrent falls.   The patient CK is elevated at thousand. The creatinine is mildly above baseline. With possible kidney injury with 18 hours of inability and the patient not able to ambulate in the ED will discuss with the hospitalist.  The patients results and plan were reviewed and discussed.  Any x-rays performed were independently reviewed by myself.   Differential diagnosis were considered with the presenting HPI.  Medications  sodium chloride 0.9 % bolus 2,000 mL (not administered)  neomycin-bacitracin-polymyxin (NEOSPORIN) ointment 1 application (not administered)  acetaminophen (TYLENOL) tablet 650 mg (not administered)  albuterol (PROVENTIL HFA;VENTOLIN HFA) 108 (90 Base) MCG/ACT inhaler 2 puff (not administered)  nystatin cream (MYCOSTATIN) (not administered)  diltiazem (CARDIZEM CD) 24 hr capsule 240 mg (not administered)  pantoprazole (PROTONIX) EC tablet 80 mg (not administered)  Diclofenac Sodium 2 % SOLN 2 Squirt (not administered)  levocetirizine (XYZAL) tablet 5 mg (not administered)  apixaban (ELIQUIS) tablet 2.5 mg (not administered)  levothyroxine (SYNTHROID, LEVOTHROID) tablet 300 mcg (not administered)  irbesartan (AVAPRO) tablet 75 mg (not administered)  albuterol (PROVENTIL) (2.5 MG/3ML) 0.083% nebulizer solution 2.5 mg (not administered)  fluticasone (FLONASE) 50 MCG/ACT nasal spray 2 spray (not administered)  calcitRIOL (ROCALTROL) capsule 1.5 mcg (not administered)    montelukast (SINGULAIR) tablet 10 mg (not administered)  0.9 %  sodium chloride infusion (not administered)  ondansetron (ZOFRAN) tablet 4 mg (not administered)    Or  ondansetron (ZOFRAN) injection 4 mg (not administered)  acetaminophen (TYLENOL) tablet 650 mg (not administered)    Or  acetaminophen (TYLENOL) suppository 650 mg (not administered)  mometasone-formoterol (DULERA) 200-5 MCG/ACT inhaler 2 puff (not administered)  traMADol (ULTRAM) tablet 50 mg (not administered)  potassium chloride SA (K-DUR,KLOR-CON) CR tablet 40 mEq (not administered)  calcium gluconate 1 g in sodium chloride 0.9 % 100 mL IVPB (not administered)  sodium chloride 0.9 % bolus 1,000 mL (0 mLs Intravenous Stopped 12/09/16 2036)  Tdap (BOOSTRIX) injection 0.5 mL (0.5 mLs Intramuscular Given 12/09/16 1619)  acetaminophen (TYLENOL) tablet 1,000 mg (1,000 mg Oral Given 12/09/16 2034)    Vitals:   12/09/16 1548 12/09/16 1600 12/09/16 1837 12/09/16 2131  BP: 129/63 114/63 127/66 121/63  Pulse: 72 72 77 89  Resp: (!) 21 18 (!) 22 (!) 22  Temp: 98.2 F (36.8 C)   98.6 F (37 C)  TempSrc: Oral   Oral  SpO2: 100% 96% 100% 94%    Final diagnoses:  Non-traumatic rhabdomyolysis    Admission/ observation were discussed with the admitting physician, patient and/or family and they are comfortable with the plan.     Final Clinical Impressions(s) / ED Diagnoses   Final diagnoses:  Non-traumatic rhabdomyolysis    New Prescriptions New Prescriptions   No medications on file     Deno Etienne, DO 12/09/16 2302

## 2016-12-09 NOTE — ED Triage Notes (Signed)
Pt reports to the ED via [facility] following a mechanical fall. Pt fell at 22:00 last night and was unable to reach her phone. She laid on the ground until she was discovered today around 1330. Pt was just d/c from [facility] following a fall. Pt has some abrasions from trying to drag herself on the floor. She is also complaining of some bilateral shoulder pain as well. She is on Eliquis. She denies hitting her head or losing consciousness.

## 2016-12-09 NOTE — ED Notes (Signed)
2 RNs have attempted to obtain  IV access without success.

## 2016-12-09 NOTE — ED Notes (Signed)
Attempted to ambulate pt, but unable to. Pt had a hard time moving her legs. Dr. Tyrone Nine informed.

## 2016-12-10 ENCOUNTER — Ambulatory Visit (INDEPENDENT_AMBULATORY_CARE_PROVIDER_SITE_OTHER): Payer: Self-pay | Admitting: Orthopedic Surgery

## 2016-12-10 ENCOUNTER — Observation Stay (HOSPITAL_COMMUNITY): Payer: 59

## 2016-12-10 DIAGNOSIS — E876 Hypokalemia: Secondary | ICD-10-CM

## 2016-12-10 DIAGNOSIS — R296 Repeated falls: Secondary | ICD-10-CM | POA: Diagnosis not present

## 2016-12-10 DIAGNOSIS — N200 Calculus of kidney: Secondary | ICD-10-CM | POA: Diagnosis not present

## 2016-12-10 HISTORY — DX: Hypocalcemia: E83.51

## 2016-12-10 HISTORY — DX: Hypokalemia: E87.6

## 2016-12-10 LAB — BASIC METABOLIC PANEL
Anion gap: 9 (ref 5–15)
BUN: 17 mg/dL (ref 6–20)
CHLORIDE: 104 mmol/L (ref 101–111)
CO2: 23 mmol/L (ref 22–32)
Calcium: 7.1 mg/dL — ABNORMAL LOW (ref 8.9–10.3)
Creatinine, Ser: 0.8 mg/dL (ref 0.44–1.00)
GFR calc non Af Amer: >60 mL/min (ref >60)
Glucose, Bld: 125 mg/dL — ABNORMAL HIGH (ref 65–99)
POTASSIUM: 2.9 mmol/L — AB (ref 3.5–5.1)
SODIUM: 136 mmol/L (ref 135–145)

## 2016-12-10 LAB — MAGNESIUM: MAGNESIUM: 2.1 mg/dL (ref 1.7–2.4)

## 2016-12-10 LAB — CBC
HCT: 38.2 % (ref 36.0–46.0)
HEMOGLOBIN: 12.5 g/dL (ref 12.0–15.0)
MCH: 27.5 pg (ref 26.0–34.0)
MCHC: 32.7 g/dL (ref 30.0–36.0)
MCV: 84.1 fL (ref 78.0–100.0)
Platelets: 162 10*3/uL (ref 150–400)
RBC: 4.54 MIL/uL (ref 3.87–5.11)
RDW: 14.5 % (ref 11.5–15.5)
WBC: 12.6 10*3/uL — ABNORMAL HIGH (ref 4.0–10.5)

## 2016-12-10 LAB — RAPID URINE DRUG SCREEN, HOSP PERFORMED
AMPHETAMINES: NOT DETECTED
BENZODIAZEPINES: NOT DETECTED
Barbiturates: NOT DETECTED
COCAINE: NOT DETECTED
OPIATES: NOT DETECTED
TETRAHYDROCANNABINOL: NOT DETECTED

## 2016-12-10 LAB — TROPONIN I: Troponin I: 0.03 ng/mL (ref <0.03)

## 2016-12-10 LAB — CK: Total CK: 946 U/L — ABNORMAL HIGH (ref 38–234)

## 2016-12-10 MED ORDER — POTASSIUM CHLORIDE CRYS ER 20 MEQ PO TBCR
40.0000 meq | EXTENDED_RELEASE_TABLET | Freq: Two times a day (BID) | ORAL | Status: AC
  Administered 2016-12-10: 40 meq via ORAL
  Filled 2016-12-10: qty 2

## 2016-12-10 MED ORDER — SODIUM CHLORIDE 0.9 % IV SOLN
1.0000 g | Freq: Once | INTRAVENOUS | Status: AC
  Administered 2016-12-10: 1 g via INTRAVENOUS
  Filled 2016-12-10: qty 10

## 2016-12-10 MED ORDER — POTASSIUM CHLORIDE CRYS ER 20 MEQ PO TBCR
40.0000 meq | EXTENDED_RELEASE_TABLET | ORAL | Status: AC
  Administered 2016-12-10: 40 meq via ORAL
  Filled 2016-12-10: qty 2

## 2016-12-10 MED ORDER — CALCIUM CARBONATE ANTACID 500 MG PO CHEW
1.0000 | CHEWABLE_TABLET | Freq: Two times a day (BID) | ORAL | Status: DC
  Administered 2016-12-10 – 2016-12-12 (×4): 200 mg via ORAL
  Filled 2016-12-10 (×5): qty 1

## 2016-12-10 NOTE — Care Management Note (Signed)
Case Management Note  Patient Details  Name: [name] MRN: [id number] Date of Birth: 15-Aug-1955  Subjective/Objective:  Pt admitted with recurrent falls - per attending - Suspect related to polypharmacy, overdiuresis, and/or deconditioning given body habitus.                Action/Plan:   PTA from home alone and has recently suffered multiple falls.  PT eval pending.  CM will continue to follow for discharge needs   Expected Discharge Date:  12/15/16               Expected Discharge Plan:     In-House Referral:     Discharge planning Services  CM Consult  Post Acute Care Choice:    Choice offered to:     DME Arranged:    DME Agency:     HH Arranged:    HH Agency:     Status of Service:     If discussed at [employer], dates discussed:    Additional Comments:  Maryclare Labrador, RN 12/10/2016, 3:34 PM

## 2016-12-10 NOTE — Progress Notes (Signed)
CRITICAL VALUE ALERT  Critical Value:  0.03 troponin  Date & Time Notied:  12/10/16 @ 03:30  Provider Notified: on call hospitalist  Orders Received/Actions taken: no new orders

## 2016-12-10 NOTE — ED Notes (Signed)
Attempted to call report

## 2016-12-10 NOTE — Consult Note (Signed)
[facility] Nurse wound consult note Reason for Consult: abrasions from fall at home Wound type:trauma Right knee: 3.5cm x 2cm x 0; 4.5cm x 3cm x 0cm  Left  knee:  2cm x 2.5cmx 0cm Face: 4cm x 5cm x 0cm Right elbow: 0.5cm x 0.5cm x 0 Right great toe: 0.2cm x 0.2cm x 0cm  Pressure Injury POA: NA Measurement: see above Wound bed:all the abrasions are dry, the only open area is right elbow (yellow) Drainage (amount, consistency, odor) minimal right elbow Periwound: intact, bruising  noted shoulders and arms Dressing procedure/placement/frequency: Silicone foam to the right elbow No topical care other than antibiotic ointment to the other affected areas.   Discussed POC with patient and bedside nurse.  Re consult if needed, will not follow at this time. Thanks  Javone Ybanez [employer], RN,CWOCN, CNS, [place] [phone])

## 2016-12-10 NOTE — Progress Notes (Addendum)
PROGRESS NOTE    [name]  MLY:[id number] DOB: [birth year] DOA: 12/09/2016 PCP: Debbrah Alar, NP    Brief Narrative:  61 y.o. female with medical history significant of HTN, HLD A. Fib on Xarelto, morbid obesity, H/O nephrolithiasis, osteoarthritis; who presents after having a fall at home unable to get herself up. The patient along with family present at bedside and provided additional history and stated that the patient has had at least 3 falls in the last 3 days. At baseline patient walks with a rolling walker. With one of the falls she tripped over the carpet,  fell backwards while using walker, and the most recent  fall was somehow wedged between her bed and nightstand unable to get herself up for 18 hours.   Assessment & Plan:   Principal Problem:   Recurrent falls - suspected to be from multiple factors: polypharmacy, over diuresis, and or deconditioning. - consulted physical therapy - with decrease in sedating medications patient's mental status is back to baseline, family member stating the patient was hallucinating as well.   Hypokalemia - We'll replace orally and reassess next a.m.  Active Problems:   Hypothyroidism - Patient to continue Synthroid.  Atrial fibrillation - rate controlled on Cardizem - on Eliquis for anticoagulation at home. Patient decreased to 2.5 mg orally herself due to increased bruising. - I have gone through the EMR particularly the last notes written by cardiology. The only thing I was able to find was that the cardiologist spoke to patient regarding her increased risk of stroke and it was decided to start patient on an addendum: NOAC. Reportedly patient did have history of GI bleed and xarelto was discontinued in the past. On my calculation patient has a chads score of 2.     OBESITY, MORBID   Leukocytosis - most likely reactive. No source of infection identified.     Hypocalcemia - 7.9 when corrected, as such will provide oral  calcium replacement. Reassess next am.  Rhabdo - mild and trending down. Suspect cause of mild elevation in troponin. - continue IVF's  DVT prophylaxis: Eliquis Code Status: Full Family Communication: Discussed with patient and family member at bedside Disposition Plan: Pending improvement in condition   Consultants:   None   Procedures: None   Antimicrobials: None   Subjective: The patient has no new complaints. Per family member patient is near or at baseline. No hallucinations reported  Objective: Vitals:   12/10/16 0000 12/10/16 0137 12/10/16 0515 12/10/16 1456  BP: 120/77 122/66 (!) 141/61 121/61  Pulse: 87 76 77 73  Resp: 19 18 18 20   Temp:  98.9 F (37.2 C) 98.2 F (36.8 C) 98.3 F (36.8 C)  TempSrc:  Oral Oral Oral  SpO2: 96% 97% 96% 96%  Weight:  (!) 140.6 kg (310 lb)    Height:  5\' 4"  (1.626 m)      Intake/Output Summary (Last 24 hours) at 12/10/16 1547 Last data filed at 12/10/16 1300  Gross per 24 hour  Intake           876.25 ml  Output                0 ml  Net           876.25 ml   Filed Weights   12/10/16 0137  Weight: (!) 140.6 kg (310 lb)    Examination:  General exam: Appears calm and comfortable , In no acute distress Respiratory system: Equal chest rise, no wheezes Cardiovascular  system: S1 & S2 heard, RRR.  Gastrointestinal system: Abdomen is nondistended, soft and nontender. No organomegaly or masses felt. Normal bowel sounds heard. Central nervous system: Alert and oriented. No focal neurological deficits. Extremities: Warm and dry Skin: No rashes, lesions or ulcers, and limited exam Psychiatry: . Mood & affect appropriate.     Data Reviewed: I have personally reviewed following labs and imaging studies  CBC:  Recent Labs Lab 12/09/16 1922 12/09/16 1937 12/10/16 0212  WBC 14.9*  --  12.6*  NEUTROABS 12.8*  --   --   HGB 13.0 13.9 12.5  HCT 40.3 41.0 38.2  MCV 83.1  --  84.1  PLT 180  --  638   Basic Metabolic  Panel:  Recent Labs Lab 12/09/16 1922 12/09/16 1937 12/10/16 0212  NA 136 140 136  K 3.1* 3.2* 2.9*  CL 102 101 104  CO2 24  --  23  GLUCOSE 105* 99 125*  BUN 19 20 17   CREATININE 0.88 0.80 0.80  CALCIUM 7.5*  --  7.1*  MG  --   --  2.1   GFR: Estimated Creatinine Clearance: 106.5 mL/min (by C-G formula based on SCr of 0.8 mg/dL). Liver Function Tests:  Recent Labs Lab 12/09/16 1922  AST 35  ALT 32  ALKPHOS 79  BILITOT 1.0  PROT 6.4*  ALBUMIN 3.0*   No results for input(s): LIPASE, AMYLASE in the last 168 hours. No results for input(s): AMMONIA in the last 168 hours. Coagulation Profile: No results for input(s): INR, PROTIME in the last 168 hours. Cardiac Enzymes:  Recent Labs Lab 12/09/16 1922 12/10/16 0212  CKTOTAL 958* 946*  TROPONINI  --  0.03*   BNP (last 3 results) No results for input(s): PROBNP in the last 8760 hours. HbA1C: No results for input(s): HGBA1C in the last 72 hours. CBG: No results for input(s): GLUCAP in the last 168 hours. Lipid Profile: No results for input(s): CHOL, HDL, LDLCALC, TRIG, CHOLHDL, LDLDIRECT in the last 72 hours. Thyroid Function Tests:  Recent Labs  12/09/16 1922  TSH 1.394   Anemia Panel: No results for input(s): VITAMINB12, FOLATE, FERRITIN, TIBC, IRON, RETICCTPCT in the last 72 hours. Sepsis Labs: No results for input(s): PROCALCITON, LATICACIDVEN in the last 168 hours.  No results found for this or any previous visit (from the past 240 hour(s)).    Radiology Studies: Ct Abdomen Pelvis Wo Contrast  Result Date: 12/10/2016 CLINICAL DATA:  61 year old female with flank pain. EXAM: CT ABDOMEN AND PELVIS WITHOUT CONTRAST TECHNIQUE: Multidetector CT imaging of the abdomen and pelvis was performed following the standard protocol without IV contrast. COMPARISON:  Abdominal radiograph dated 06/30/2016 and pelvic radiograph dated 12/09/2016 FINDINGS: Evaluation of this exam is limited in the absence of intravenous  contrast. Lower chest: The visualized lung bases are clear. No intra-abdominal free air or free fluid. Hepatobiliary: Diffuse fatty infiltration of the liver. The gallbladder is unremarkable. There is no intrahepatic biliary ductal dilatation. Pancreas: Unremarkable. No pancreatic ductal dilatation or surrounding inflammatory changes. Spleen: Normal in size without focal abnormality. Adrenals/Urinary Tract: The adrenal glands are unremarkable. There are bilateral nonobstructing renal calculi. The largest stone measures up to 223 mm in length extending from the inferior pole collecting system into the major calyx. There is a 5 mm nonobstructing stone in the right renal pelvis. There is no hydronephrosis on either side. The visualized ureters appear unremarkable. Multiple small stones noted layering along the posterior wall of the urinary bladder adjacent to the left  UVJ. The bladder is partially distended but otherwise appears unremarkable. Stomach/Bowel: Moderate amount of stool noted in the distal colon. There is no evidence of bowel obstruction or active inflammation. Appendectomy. Vascular/Lymphatic: There is moderate aortoiliac atherosclerotic disease. Evaluation of the vasculature is limited in the absence of intravenous contrast. No portal venous gas identified. There is no adenopathy. Reproductive: Abdomen the uterus is anteverted and grossly unremarkable. An intrauterine device is noted. The ovaries are grossly unremarkable as visualized. No pelvic mass. Other: Mild diffuse subcutaneous stranding and edema. Musculoskeletal: There is degenerative changes of the spine with osteophyte formation. No acute osseous pathology. IMPRESSION: 1. Nonobstructing bilateral renal calculi. No hydronephrosis. Multiple small stones noted layering within the urinary bladder. No obstructing calculi identified. 2. Fatty liver. 3. No bowel obstruction or active inflammation. Electronically Signed   By: Anner Crete M.D.   On:  12/10/2016 01:03   Dg Chest 2 View  Result Date: 12/09/2016 CLINICAL DATA:  Recent falls with chest pain, initial encounter EXAM: CHEST  2 VIEW COMPARISON:  08/19/2013 FINDINGS: The heart size and mediastinal contours are within normal limits. Both lungs are clear. The visualized skeletal structures show degenerative change of the thoracic spine. IMPRESSION: No active cardiopulmonary disease. Electronically Signed   By: Inez Catalina M.D.   On: 12/09/2016 18:28   Dg Pelvis 1-2 Views  Result Date: 12/09/2016 CLINICAL DATA:  Recent falls with pelvic pain, initial encounter EXAM: PELVIS - 1-2 VIEW COMPARISON:  None. FINDINGS: Pelvic ring is intact. An IUD is noted in place. No definitive fracture or dislocation is seen. Degenerative changes in the lumbar spine and hip joints are noted. IMPRESSION: No acute abnormality noted. Electronically Signed   By: Inez Catalina M.D.   On: 12/09/2016 18:30   Ct Head Wo Contrast  Result Date: 12/09/2016 CLINICAL DATA:  Multiple falls are Eliquis.  Initial encounter. EXAM: CT HEAD WITHOUT CONTRAST TECHNIQUE: Contiguous axial images were obtained from the base of the skull through the vertex without intravenous contrast. COMPARISON:  None. FINDINGS: Brain: No evidence of acute infarction, hemorrhage, hydrocephalus, extra-axial collection or mass lesion/mass effect. Vascular: Arterial calcification.  No hyperdense vessel. Skull: No acute or aggressive finding.  Incidental hyperostosis. Sinuses/Orbits: Negative IMPRESSION: Negative head CT. Electronically Signed   By: Monte Fantasia M.D.   On: 12/09/2016 17:00   Dg Knee Complete 4 Views Left  Result Date: 12/09/2016 CLINICAL DATA:  Multiple recent falls with knee pain, initial encounter EXAM: LEFT KNEE - COMPLETE 4+ VIEW COMPARISON:  None. FINDINGS: Mild degenerative changes are noted primarily within the medial joint space. No joint effusion is seen. No acute fracture or dislocation is noted. IMPRESSION: Degenerative  change without acute abnormality. Electronically Signed   By: Inez Catalina M.D.   On: 12/09/2016 18:29   Dg Knee Complete 4 Views Right  Result Date: 12/09/2016 CLINICAL DATA:  Multiple recent falls with right knee pain, initial encounter EXAM: RIGHT KNEE - COMPLETE 4+ VIEW COMPARISON:  None. FINDINGS: Tricompartmental degenerative change is noted. No acute fracture or dislocation is seen. No joint effusion is noted. No soft tissue abnormality is seen. IMPRESSION: Degenerative change without acute abnormality. Electronically Signed   By: Inez Catalina M.D.   On: 12/09/2016 18:29    Scheduled Meds: . apixaban  2.5 mg Oral Daily  . calcitRIOL  1.5 mcg Oral Daily  . diclofenac sodium  1 application Topical BID  . diltiazem  240 mg Oral Daily  . irbesartan  75 mg Oral Daily  .  levothyroxine  300 mcg Oral QAC breakfast  . loratadine  10 mg Oral QPM  . mometasone-formoterol  2 puff Inhalation BID  . montelukast  10 mg Oral QHS  . neomycin-bacitracin-polymyxin   Topical BID  . nystatin cream   Topical BID  . pantoprazole  80 mg Oral Daily   Continuous Infusions: . sodium chloride 75 mL/hr at 12/10/16 0119     LOS: 0 days    Time spent: > 35 minutes  Velvet Bathe, MD [facility] Pager [phone]  If 7PM-7AM, please contact night-coverage [email or web address] Password [facility] 12/10/2016, 3:47 PM

## 2016-12-10 NOTE — Progress Notes (Signed)
Patient arrived from the emergency room via stretcher.  IV in right ac, fluids infusing.  Tech from ED wanted patient to walk from stretcher to bed, very bad idea.  Patient could hardly move her legs, three people assisted her into the bed.  Patient unable to stand for orthostatic vitals.  Tele box #18 placed on patient, history of afib.  Bathed and medications applied to abrasions, purewick in place, call bell within reach.  Will continue to monitor.

## 2016-12-10 NOTE — Evaluation (Signed)
Physical Therapy Evaluation Patient Details Name: [name] MRN: [id number] DOB: [birth year] Today's Date: 12/10/2016   History of Present Illness  Pt is 61 y/o female admitted secondary to recurrent falls. Pt has had 3 falls within the past 5 days and 7 falls in the past three months. PMH includes obesity, leukocytosis, asthma, HTN, anemia, BLE edema, fibromyalgia, and a fib.   Clinical Impression  Pt is admitted secondary to problem above with deficits below. PTA, pt reports she was using RW at night and cane during the day. Upon eval, pt very limited secondary to pain in shoulders and LE and weakness. Pt also reporting decreased sensation in BLE. Pt required heavy max A to roll as pt unable to use UE secondary to shoulder pain. Attempted to sit EOB, however, pt only able to prop on elbow in sidelying. Required max A to get LE to EOB and to assist with propping. Discussed need for SNF at d/c to increase independence and safety with mobility. Will continue to follow acutely to maximize functional independence and safety.     Follow Up Recommendations SNF    Equipment Recommendations  Wheelchair (measurements PT);Wheelchair cushion (measurements PT);Hospital bed    Recommendations for Other Services OT consult     Precautions / Restrictions Precautions Precautions: Fall Precaution Comments: Hx of falling; 3 falls within past 5 days. Restrictions Weight Bearing Restrictions: No      Mobility  Bed Mobility Overal bed mobility: Needs Assistance Bed Mobility: Rolling Rolling: Max assist         General bed mobility comments: Heavy max A for rolling. Attempted to perform supine<>sit transfer, however, pt only able to prop on elbow secondary to shoulder pain and weakness. Pt required max A to move BLE to EOB.   Transfers                 General transfer comment: Not tested  Ambulation/Gait                Stairs            Wheelchair Mobility     Modified Rankin (Stroke Patients Only)       Balance                                             Pertinent Vitals/Pain Pain Assessment: 0-10 Pain Score: 5  Pain Location: R elbow; bilat shoulders  Pain Descriptors / Indicators: Sore Pain Intervention(s): Limited activity within patient's tolerance;Monitored during session;Repositioned    Home Living Family/patient expects to be discharged to:: Private residence Living Arrangements: Other relatives Available Help at Discharge: Family;Available 24 hours/day Type of Home: Other(Comment) (townhome ) Home Access: Ramped entrance;Other (comment) (small step up )     Home Layout: One level Home Equipment: Shower seat - built in;Walker - 2 wheels;Cane - single point      Prior Function Level of Independence: Independent with assistive device(s)         Comments: Used walker at night and cane during the day. Has had 3 falls within the last 5 days. 7 falls within the past 3 months.      Hand Dominance   Dominant Hand: Right    Extremity/Trunk Assessment   Upper Extremity Assessment Upper Extremity Assessment: RUE deficits/detail;LUE deficits/detail RUE Deficits / Details: unable to perform shoulder flexion. Pt reporting tightness.  LUE Deficits /  Details: unable to perform shoulder flexion. Pt reporting tightness.     Lower Extremity Assessment Lower Extremity Assessment: RLE deficits/detail;LLE deficits/detail RLE Deficits / Details: numbness, better in morning from hips to toes. Grossly 2+/5. LLE Deficits / Details: numbness, better in morning from hips to toes. Grossly 2+/5.     Cervical / Trunk Assessment Cervical / Trunk Assessment: Normal  Communication   Communication: No difficulties  Cognition Arousal/Alertness: Awake/alert Behavior During Therapy: WFL for tasks assessed/performed Overall Cognitive Status: Within Functional Limits for tasks assessed                                         General Comments General comments (skin integrity, edema, etc.): Pt very limited this session secondary to weakness and shoulder pain. Unable to come up to full sitting secondary to inability to press through     Exercises General Exercises - Lower Extremity Ankle Circles/Pumps: AROM;Both;5 reps;Supine   Assessment/Plan    PT Assessment Patient needs continued PT services  PT Problem List Decreased strength;Decreased activity tolerance;Decreased balance;Decreased mobility;Decreased knowledge of use of DME;Pain;Impaired sensation;Obesity;Decreased skin integrity       PT Treatment Interventions DME instruction;Gait training;Functional mobility training;Therapeutic activities;Therapeutic exercise;Balance training;Neuromuscular re-education;Patient/family education    PT Goals (Current goals can be found in the Care Plan section)  Acute Rehab PT Goals Patient Stated Goal: to get stronger and stop falling  PT Goal Formulation: With patient Time For Goal Achievement: 12/24/16 Potential to Achieve Goals: Fair    Frequency Min 2X/week   Barriers to discharge        Co-evaluation               AM-PAC PT "6 Clicks" Daily Activity  Outcome Measure Difficulty turning over in bed (including adjusting bedclothes, sheets and blankets)?: Total Difficulty moving from lying on back to sitting on the side of the bed? : Total Difficulty sitting down on and standing up from a chair with arms (e.g., wheelchair, bedside commode, etc,.)?: Total Help needed moving to and from a bed to chair (including a wheelchair)?: Total Help needed walking in hospital room?: Total Help needed climbing 3-5 steps with a railing? : Total 6 Click Score: 6    End of Session   Activity Tolerance: Patient limited by pain Patient left: in bed;with call bell/phone within reach Nurse Communication: Mobility status PT Visit Diagnosis: Repeated falls (R29.6);History of falling  (Z91.81);Muscle weakness (generalized) (M62.81);Pain Pain - Right/Left:  (bilat) Pain - part of body: Shoulder;Leg (elbow; toes)    Time: 1700-1749 PT Time Calculation (min) (ACUTE ONLY): 28 min   Charges:   PT Evaluation $PT Eval Moderate Complexity: 1 Procedure PT Treatments $Therapeutic Activity: 8-22 mins   PT G Codes:   PT G-Codes **NOT FOR INPATIENT CLASS** Functional Assessment Tool Used: AM-PAC 6 Clicks Basic Mobility;Clinical judgement Functional Limitation: Mobility: Walking and moving around Mobility: Walking and Moving Around Current Status (S4967): 100 percent impaired, limited or restricted Mobility: Walking and Moving Around Goal Status (R9163): At least 60 percent but less than 80 percent impaired, limited or restricted    Leighton Ruff, PT, DPT  Acute Rehabilitation Services  Pager: [phone]   [name] 12/10/2016, 4:28 PM

## 2016-12-11 DIAGNOSIS — R296 Repeated falls: Secondary | ICD-10-CM | POA: Diagnosis not present

## 2016-12-11 LAB — BASIC METABOLIC PANEL
ANION GAP: 7 (ref 5–15)
BUN: 13 mg/dL (ref 6–20)
CO2: 23 mmol/L (ref 22–32)
Calcium: 7.4 mg/dL — ABNORMAL LOW (ref 8.9–10.3)
Chloride: 109 mmol/L (ref 101–111)
Creatinine, Ser: 0.77 mg/dL (ref 0.44–1.00)
GFR calc Af Amer: >60 mL/min (ref >60)
GLUCOSE: 91 mg/dL (ref 65–99)
POTASSIUM: 4.1 mmol/L (ref 3.5–5.1)
SODIUM: 139 mmol/L (ref 135–145)

## 2016-12-11 LAB — T4: T4, Total: 6.7 ug/dL (ref 4.5–12.0)

## 2016-12-11 LAB — HIV ANTIBODY (ROUTINE TESTING W REFLEX): HIV SCREEN 4TH GENERATION: NONREACTIVE

## 2016-12-11 MED ORDER — TRAMADOL HCL 50 MG PO TABS
100.0000 mg | ORAL_TABLET | Freq: Three times a day (TID) | ORAL | Status: DC | PRN
  Administered 2016-12-11: 100 mg via ORAL
  Filled 2016-12-11: qty 2

## 2016-12-11 NOTE — Progress Notes (Addendum)
PROGRESS NOTE    [name]  IRW:[id number] DOB: Oct 18, 1955 DOA: 12/09/2016 PCP: Debbrah Alar, NP    Brief Narrative:  61 y.o. female with medical history significant of HTN, HLD A. Fib on Xarelto, morbid obesity, H/O nephrolithiasis, osteoarthritis; who presents after having a fall at home unable to get herself up. The patient along with family present at bedside and provided additional history and stated that the patient has had at least 3 falls in the last 3 days. At baseline patient walks with a rolling walker. With one of the falls she tripped over the carpet,  fell backwards while using walker, and the most recent  fall was somehow wedged between her bed and nightstand unable to get herself up for 18 hours.   Assessment & Plan:   Principal Problem:   Recurrent falls - suspected to be from multiple factors: polypharmacy, over diuresis, and or deconditioning. - Physical therapy recommending skilled nursing facility - with decrease in sedating medications patient's mental status is back to baseline  Hypokalemia - After replacement within normal limits.  Active Problems:   Hypothyroidism - Stable on Synthroid  Atrial fibrillation - rate controlled on Cardizem - on Eliquis for anticoagulation at home. Patient decreased to 2.5 mg orally herself due to increased bruising. - I have gone through the EMR particularly the last notes written by cardiology. The only thing I was able to find was that the cardiologist spoke to patient regarding her increased risk of stroke and it was decided to start patient on an NOAC. Reportedly patient did have history of GI bleed and xarelto was discontinued in the past. On my calculation patient has a chads score of 2.     OBESITY, MORBID   Leukocytosis - most likely reactive. No source of infection identified.     Hypocalcemia - 7.9 when corrected, as such will provide oral calcium replacement. Reassess next am.  Rhabdo - mild and  trending down. Suspect cause of mild elevation in troponin. - Continue supportive therapy with tramadol, will increase dose as patient states that she is having minimal improvement in pain control on 50 mg dosing.  DVT prophylaxis: Eliquis Code Status: Full Family Communication: Discussed with patient and family member at bedside Disposition Plan: SNF most likely next a.m.   Consultants:   None   Procedures: None   Antimicrobials: None   Subjective: The patient and family member had many questions which were answered to their satisfaction. No new complaints reported other than pain now p.m. well controlled on current tramadol dose.  Objective: Vitals:   12/10/16 2143 12/11/16 0556 12/11/16 0814 12/11/16 1405  BP: (!) 128/56 125/72  (!) 148/79  Pulse: 81 69 72 71  Resp: 18 18 18 20   Temp: 98.9 F (37.2 C) 98.5 F (36.9 C)  98.9 F (37.2 C)  TempSrc: Oral Oral    SpO2: 98% 98% 98% 97%  Weight:      Height:        Intake/Output Summary (Last 24 hours) at 12/11/16 1609 Last data filed at 12/11/16 1405  Gross per 24 hour  Intake             1740 ml  Output             3600 ml  Net            -1860 ml   Filed Weights   12/10/16 0137  Weight: (!) 140.6 kg (310 lb)    Examination:Exam unchanged when compared  to 12/10/2016  General exam: Appears calm and comfortable , In no acute distress Respiratory system: Equal chest rise, no wheezes Cardiovascular system: S1 & S2 heard, RRR.  Gastrointestinal system: Abdomen is nondistended, soft and nontender. No organomegaly or masses felt. Normal bowel sounds heard. Central nervous system: Alert and oriented. No focal neurological deficits. Extremities: Warm and dry Skin: No rashes, lesions or ulcers, and limited exam Psychiatry: . Mood & affect appropriate.     Data Reviewed: I have personally reviewed following labs and imaging studies  CBC:  Recent Labs Lab 12/09/16 1922 12/09/16 1937 12/10/16 0212  WBC 14.9*   --  12.6*  NEUTROABS 12.8*  --   --   HGB 13.0 13.9 12.5  HCT 40.3 41.0 38.2  MCV 83.1  --  84.1  PLT 180  --  882   Basic Metabolic Panel:  Recent Labs Lab 12/09/16 1922 12/09/16 1937 12/10/16 0212 12/11/16 0246  NA 136 140 136 139  K 3.1* 3.2* 2.9* 4.1  CL 102 101 104 109  CO2 24  --  23 23  GLUCOSE 105* 99 125* 91  BUN 19 20 17 13   CREATININE 0.88 0.80 0.80 0.77  CALCIUM 7.5*  --  7.1* 7.4*  MG  --   --  2.1  --    GFR: Estimated Creatinine Clearance: 106.5 mL/min (by C-G formula based on SCr of 0.77 mg/dL). Liver Function Tests:  Recent Labs Lab 12/09/16 1922  AST 35  ALT 32  ALKPHOS 79  BILITOT 1.0  PROT 6.4*  ALBUMIN 3.0*   No results for input(s): LIPASE, AMYLASE in the last 168 hours. No results for input(s): AMMONIA in the last 168 hours. Coagulation Profile: No results for input(s): INR, PROTIME in the last 168 hours. Cardiac Enzymes:  Recent Labs Lab 12/09/16 1922 12/10/16 0212  CKTOTAL 958* 946*  TROPONINI  --  0.03*   BNP (last 3 results) No results for input(s): PROBNP in the last 8760 hours. HbA1C: No results for input(s): HGBA1C in the last 72 hours. CBG: No results for input(s): GLUCAP in the last 168 hours. Lipid Profile: No results for input(s): CHOL, HDL, LDLCALC, TRIG, CHOLHDL, LDLDIRECT in the last 72 hours. Thyroid Function Tests:  Recent Labs  12/09/16 1922  TSH 1.394  T4TOTAL 6.7   Anemia Panel: No results for input(s): VITAMINB12, FOLATE, FERRITIN, TIBC, IRON, RETICCTPCT in the last 72 hours. Sepsis Labs: No results for input(s): PROCALCITON, LATICACIDVEN in the last 168 hours.  No results found for this or any previous visit (from the past 240 hour(s)).    Radiology Studies: Ct Abdomen Pelvis Wo Contrast  Result Date: 12/10/2016 CLINICAL DATA:  61 year old female with flank pain. EXAM: CT ABDOMEN AND PELVIS WITHOUT CONTRAST TECHNIQUE: Multidetector CT imaging of the abdomen and pelvis was performed following the  standard protocol without IV contrast. COMPARISON:  Abdominal radiograph dated 06/30/2016 and pelvic radiograph dated 12/09/2016 FINDINGS: Evaluation of this exam is limited in the absence of intravenous contrast. Lower chest: The visualized lung bases are clear. No intra-abdominal free air or free fluid. Hepatobiliary: Diffuse fatty infiltration of the liver. The gallbladder is unremarkable. There is no intrahepatic biliary ductal dilatation. Pancreas: Unremarkable. No pancreatic ductal dilatation or surrounding inflammatory changes. Spleen: Normal in size without focal abnormality. Adrenals/Urinary Tract: The adrenal glands are unremarkable. There are bilateral nonobstructing renal calculi. The largest stone measures up to 223 mm in length extending from the inferior pole collecting system into the major calyx. There is a  5 mm nonobstructing stone in the right renal pelvis. There is no hydronephrosis on either side. The visualized ureters appear unremarkable. Multiple small stones noted layering along the posterior wall of the urinary bladder adjacent to the left UVJ. The bladder is partially distended but otherwise appears unremarkable. Stomach/Bowel: Moderate amount of stool noted in the distal colon. There is no evidence of bowel obstruction or active inflammation. Appendectomy. Vascular/Lymphatic: There is moderate aortoiliac atherosclerotic disease. Evaluation of the vasculature is limited in the absence of intravenous contrast. No portal venous gas identified. There is no adenopathy. Reproductive: Abdomen the uterus is anteverted and grossly unremarkable. An intrauterine device is noted. The ovaries are grossly unremarkable as visualized. No pelvic mass. Other: Mild diffuse subcutaneous stranding and edema. Musculoskeletal: There is degenerative changes of the spine with osteophyte formation. No acute osseous pathology. IMPRESSION: 1. Nonobstructing bilateral renal calculi. No hydronephrosis. Multiple small  stones noted layering within the urinary bladder. No obstructing calculi identified. 2. Fatty liver. 3. No bowel obstruction or active inflammation. Electronically Signed   By: Anner Crete M.D.   On: 12/10/2016 01:03   Dg Chest 2 View  Result Date: 12/09/2016 CLINICAL DATA:  Recent falls with chest pain, initial encounter EXAM: CHEST  2 VIEW COMPARISON:  08/19/2013 FINDINGS: The heart size and mediastinal contours are within normal limits. Both lungs are clear. The visualized skeletal structures show degenerative change of the thoracic spine. IMPRESSION: No active cardiopulmonary disease. Electronically Signed   By: Inez Catalina M.D.   On: 12/09/2016 18:28   Dg Pelvis 1-2 Views  Result Date: 12/09/2016 CLINICAL DATA:  Recent falls with pelvic pain, initial encounter EXAM: PELVIS - 1-2 VIEW COMPARISON:  None. FINDINGS: Pelvic ring is intact. An IUD is noted in place. No definitive fracture or dislocation is seen. Degenerative changes in the lumbar spine and hip joints are noted. IMPRESSION: No acute abnormality noted. Electronically Signed   By: Inez Catalina M.D.   On: 12/09/2016 18:30   Ct Head Wo Contrast  Result Date: 12/09/2016 CLINICAL DATA:  Multiple falls are Eliquis.  Initial encounter. EXAM: CT HEAD WITHOUT CONTRAST TECHNIQUE: Contiguous axial images were obtained from the base of the skull through the vertex without intravenous contrast. COMPARISON:  None. FINDINGS: Brain: No evidence of acute infarction, hemorrhage, hydrocephalus, extra-axial collection or mass lesion/mass effect. Vascular: Arterial calcification.  No hyperdense vessel. Skull: No acute or aggressive finding.  Incidental hyperostosis. Sinuses/Orbits: Negative IMPRESSION: Negative head CT. Electronically Signed   By: Monte Fantasia M.D.   On: 12/09/2016 17:00   Dg Knee Complete 4 Views Left  Result Date: 12/09/2016 CLINICAL DATA:  Multiple recent falls with knee pain, initial encounter EXAM: LEFT KNEE - COMPLETE 4+ VIEW  COMPARISON:  None. FINDINGS: Mild degenerative changes are noted primarily within the medial joint space. No joint effusion is seen. No acute fracture or dislocation is noted. IMPRESSION: Degenerative change without acute abnormality. Electronically Signed   By: Inez Catalina M.D.   On: 12/09/2016 18:29   Dg Knee Complete 4 Views Right  Result Date: 12/09/2016 CLINICAL DATA:  Multiple recent falls with right knee pain, initial encounter EXAM: RIGHT KNEE - COMPLETE 4+ VIEW COMPARISON:  None. FINDINGS: Tricompartmental degenerative change is noted. No acute fracture or dislocation is seen. No joint effusion is noted. No soft tissue abnormality is seen. IMPRESSION: Degenerative change without acute abnormality. Electronically Signed   By: Inez Catalina M.D.   On: 12/09/2016 18:29    Scheduled Meds: . apixaban  2.5  mg Oral Daily  . calcitRIOL  1.5 mcg Oral Daily  . calcium carbonate  1 tablet Oral BID WC  . diclofenac sodium  1 application Topical BID  . diltiazem  240 mg Oral Daily  . irbesartan  75 mg Oral Daily  . levothyroxine  300 mcg Oral QAC breakfast  . loratadine  10 mg Oral QPM  . mometasone-formoterol  2 puff Inhalation BID  . montelukast  10 mg Oral QHS  . neomycin-bacitracin-polymyxin   Topical BID  . nystatin cream   Topical BID  . pantoprazole  80 mg Oral Daily   Continuous Infusions: . sodium chloride 75 mL/hr at 12/10/16 2146     LOS: 0 days    Time spent: > 35 minutes  Velvet Bathe, MD [facility] Pager [phone]  If 7PM-7AM, please contact night-coverage [email or web address] Password [email or web address] 12/11/2016, 4:09 PM

## 2016-12-11 NOTE — NC FL2 (Signed)
[place] LEVEL OF CARE SCREENING TOOL     IDENTIFICATION  Patient Name: [name] Birthdate: May 11, 1956 Sex: female Admission Date (Current Location): 12/09/2016  [facility] and [place] Number:  Herbalist and Address:  [facility], [place] [address], [place], [place] [address]      Provider Number: [id number]  Attending Physician Name and Address:  Velvet Bathe, MD  Relative Name and Phone Number:      Current Level of Care: Hospital Recommended Level of Care: [place] Prior Approval Number:    Date Approved/Denied:   PASRR Number: [id number]  Discharge Plan: SNF    Current Diagnoses: Patient Active Problem List   Diagnosis Date Noted  . Hypokalemia 12/10/2016  . Hypocalcemia 12/10/2016  . Recurrent falls 12/09/2016  . Ureteral calculus, right 07/17/2014  . Osteomyelitis ([place]) 06/14/2014  . Pernicious anemia 02/20/2014  . Reflux esophagitis 02/02/2014  . Edema 11/23/2013  . Foot pain 03/31/2013  . Endometrial adenocarcinoma ([place]) 02/21/2013  . Trochanteric bursitis of left hip 09/13/2012  . Muscle cramps 08/12/2012  . Fatty liver 01/07/2012  . Mid back pain 09/30/2011  . Weight gain 07/02/2011  . Nonspecific abnormal unspecified cardiovascular function study 04/30/2011  . Hx of papillary thyroid carcinoma 04/07/2011  . Low back pain 04/01/2011  . Atrial fibrillation ([place]) 02/05/2011  . Hypoparathyroidism ([place]) 01/07/2011  . GERD 05/22/2009  . Leukocytosis 03/24/2009  . OBESITY, MORBID 12/20/2008  . RHINOSINUSITIS, RECURRENT 09/07/2008  . Vitamin D deficiency 05/10/2007  . Iron deficiency anemia secondary to malabsorption  05/10/2007  . Hypothyroidism 01/05/2007  . Essential hypertension 01/05/2007  . ALLERGIC RHINITIS 01/05/2007  . Asthma 01/05/2007    Orientation RESPIRATION BLADDER Height & Weight     Self, Time, Situation, Place  Normal External catheter, Incontinent Weight: (!)  140.6 kg (310 lb) Height:  5\' 4"  (162.6 cm)  BEHAVIORAL SYMPTOMS/MOOD NEUROLOGICAL BOWEL NUTRITION STATUS      Continent Diet (Please see DC Summary)  AMBULATORY STATUS COMMUNICATION OF NEEDS Skin   Extensive Assist Verbally Normal                       Personal Care Assistance Level of Assistance  Bathing, Feeding, Dressing Bathing Assistance: Maximum assistance Feeding assistance: Independent Dressing Assistance: Maximum assistance     Functional Limitations Info             SPECIAL CARE FACTORS FREQUENCY  PT (By licensed PT)     PT Frequency: 5x/week              Contractures      Additional Factors Info  Code Status, Allergies Code Status Info: Full  Allergies Info:  Other, Amoxicillin-pot Clavulanate, Food, Penicillins, Tomato           Current Medications (12/11/2016):  This is the current hospital active medication list Current Facility-Administered Medications  Medication Dose Route Frequency Provider Last Rate Last Dose  . 0.9 %  sodium chloride infusion   Intravenous Continuous Fuller Plan A, MD 75 mL/hr at 12/10/16 2146    . acetaminophen (TYLENOL) tablet 650 mg  650 mg Oral Q6H PRN Fuller Plan A, MD       Or  . acetaminophen (TYLENOL) suppository 650 mg  650 mg Rectal Q6H PRN Smith, Rondell A, MD      . albuterol (PROVENTIL) (2.5 MG/3ML) 0.083% nebulizer solution 2.5 mg  2.5 mg Nebulization Q6H PRN Norval Morton, MD  2.5 mg at 12/11/16 0232  . apixaban (ELIQUIS) tablet 2.5 mg  2.5 mg Oral Daily Tamala Julian, Rondell A, MD   2.5 mg at 12/11/16 0902  . calcitRIOL (ROCALTROL) capsule 1.5 mcg  1.5 mcg Oral Daily Fuller Plan A, MD   1.5 mcg at 12/11/16 0902  . calcium carbonate (TUMS - dosed in mg elemental calcium) chewable tablet 200 mg of elemental calcium  1 tablet Oral BID WC Velvet Bathe, MD   200 mg of elemental calcium at 12/11/16 0902  . diclofenac sodium (VOLTAREN) 1 % transdermal gel 1 application  1 application Topical BID Norval Morton, MD   1 application at 62/86/38 0902  . diltiazem (CARDIZEM CD) 24 hr capsule 240 mg  240 mg Oral Daily Fuller Plan A, MD   240 mg at 12/11/16 0906  . fluticasone (FLONASE) 50 MCG/ACT nasal spray 2 spray  2 spray Each Nare Daily PRN Smith, Rondell A, MD      . irbesartan (AVAPRO) tablet 75 mg  75 mg Oral Daily Tamala Julian, Rondell A, MD   75 mg at 12/11/16 0902  . levothyroxine (SYNTHROID, LEVOTHROID) tablet 300 mcg  300 mcg Oral QAC breakfast Fuller Plan A, MD   300 mcg at 12/11/16 0902  . loratadine (CLARITIN) tablet 10 mg  10 mg Oral QPM Smith, Rondell A, MD   10 mg at 12/10/16 1811  . mometasone-formoterol (DULERA) 200-5 MCG/ACT inhaler 2 puff  2 puff Inhalation BID Norval Morton, MD   2 puff at 12/11/16 [phone]  . montelukast (SINGULAIR) tablet 10 mg  10 mg Oral QHS Smith, Rondell A, MD   10 mg at 12/10/16 2146  . neomycin-bacitracin-polymyxin (NEOSPORIN) 5-400-10000 ophthalmic ointment 1 application  1 application Both Eyes PRN Fuller Plan A, MD      . neomycin-bacitracin-polymyxin (NEOSPORIN) ointment   Topical BID Smith, Rondell A, MD      . nystatin cream (MYCOSTATIN)   Topical BID Smith, Rondell A, MD      . ondansetron (ZOFRAN) tablet 4 mg  4 mg Oral Q6H PRN Fuller Plan A, MD       Or  . ondansetron (ZOFRAN) injection 4 mg  4 mg Intravenous Q6H PRN Smith, Rondell A, MD      . pantoprazole (PROTONIX) EC tablet 80 mg  80 mg Oral Daily Tamala Julian, Rondell A, MD   80 mg at 12/11/16 0902  . traMADol (ULTRAM) tablet 50 mg  50 mg Oral Q8H PRN Fuller Plan A, MD   50 mg at 12/10/16 0041     Discharge Medications: Please see discharge summary for a list of discharge medications.  Relevant Imaging Results:  Relevant Lab Results:   Additional Information SSN: [place], [place]

## 2016-12-11 NOTE — Evaluation (Signed)
Occupational Therapy Evaluation Patient Details Name: [name] MRN: [id number] DOB: 1955-11-07 Today's Date: 12/11/2016    History of Present Illness Pt is 61 y/o female admitted secondary to recurrent falls. Pt has had 3 falls within the past 5 days and 7 falls in the past three months. PMH includes obesity, leukocytosis, asthma, HTN, anemia, BLE edema, fibromyalgia, and a fib.    Clinical Impression   Pt was modified independent in ADL and mobility, using a cane or RW, prior to admission. Presents with baseline shoulder issues (reports B rotator cuff tears), R toe pain, generalized weakness and impaired balance interfering with ability to perform self care. Pt was able to sit EOB x 15 minutes with supervision and perform bed mobility with moderate assistance this visit. Will follow acutely. Recommending post acute rehab in SNF prior to return home.  Follow Up Recommendations  SNF;Supervision/Assistance - 24 hour    Equipment Recommendations       Recommendations for Other Services       Precautions / Restrictions Precautions Precautions: Fall Precaution Comments: hx of multiple falls, reports dragging her R foot Required Braces or Orthoses:  (reports a "special shoe" for her L foot) Restrictions Weight Bearing Restrictions: No      Mobility Bed Mobility Overal bed mobility: Needs Assistance Bed Mobility: Supine to Sit;Sit to Supine     Supine to sit: Mod assist Sit to supine: Mod assist   General bed mobility comments: assist to raise trunk and for LEs back into bed  Transfers                 General transfer comment: Not tested    Balance Overall balance assessment: Needs assistance Sitting-balance support: Feet supported Sitting balance-Leahy Scale: Fair Sitting balance - Comments: sat EOB x 15 minutes                                   ADL either performed or assessed with clinical judgement   ADL Overall ADL's : Needs  assistance/impaired Eating/Feeding: Independent;Bed level   Grooming: Sitting;Min guard   Upper Body Bathing: Minimal assistance;Sitting   Lower Body Bathing: Total assistance;Bed level   Upper Body Dressing : Minimal assistance;Sitting   Lower Body Dressing: Total assistance;Bed level                       Vision Baseline Vision/History: Wears glasses Wears Glasses: At all times Patient Visual Report: No change from baseline       Perception     Praxis      Pertinent Vitals/Pain Pain Assessment: Faces Faces Pain Scale: Hurts even more Pain Location: R big toe Pain Descriptors / Indicators: Grimacing;Guarding Pain Intervention(s): Monitored during session;Repositioned     Hand Dominance Right   Extremity/Trunk Assessment Upper Extremity Assessment Upper Extremity Assessment: RUE deficits/detail;LUE deficits/detail RUE Deficits / Details: pt with rotator cuff tear and currently doing OPPT LUE Deficits / Details: pt with rotator cuff tear   Lower Extremity Assessment Lower Extremity Assessment: Defer to PT evaluation   Cervical / Trunk Assessment Cervical / Trunk Assessment: Normal   Communication Communication Communication: No difficulties   Cognition Arousal/Alertness: Awake/alert Behavior During Therapy: WFL for tasks assessed/performed Overall Cognitive Status: Within Functional Limits for tasks assessed  General Comments       Exercises     Shoulder Instructions      Home Living Family/patient expects to be discharged to:: Private residence Living Arrangements: Other relatives Available Help at Discharge: Family;Available 24 hours/day Type of Home: Other(Comment) (town home) Home Access: Ramped entrance;Other (comment) (small step up)     Home Layout: One level     Bathroom Shower/Tub: Occupational psychologist: Standard     Home Equipment: Shower seat - built in;Walker  - 2 wheels;Cane - single point          Prior Functioning/Environment Level of Independence: Independent with assistive device(s)        Comments: uses RW at night and cane during the day, works full time         OT Problem List: Decreased strength;Decreased activity tolerance;Impaired balance (sitting and/or standing);Decreased range of motion;Decreased safety awareness;Obesity;Pain;Impaired UE functional use      OT Treatment/Interventions: Self-care/ADL training;DME and/or AE instruction;Therapeutic activities;Patient/family education;Balance training;Therapeutic exercise    OT Goals(Current goals can be found in the care plan section) Acute Rehab OT Goals Patient Stated Goal: to get stronger and stop falling  OT Goal Formulation: With patient Time For Goal Achievement: 12/25/16 Potential to Achieve Goals: Good ADL Goals Pt Will Perform Grooming: with min guard assist;standing (one activity) Pt Will Perform Lower Body Bathing: with min assist;with adaptive equipment;sit to/from stand Pt Will Perform Lower Body Dressing: with min assist;with adaptive equipment;sit to/from stand Pt Will Transfer to Toilet: with min assist;ambulating;bedside commode Pt Will Perform Toileting - Clothing Manipulation and hygiene: with min assist;sit to/from stand Pt/caregiver will Perform Home Exercise Program: Both right and left upper extremity;Independently (AROM) Additional ADL Goal #1: Pt will perform bed mobility with min assist.  OT Frequency: Min 2X/week   Barriers to D/C:            Co-evaluation              AM-PAC PT "6 Clicks" Daily Activity     Outcome Measure Help from another person eating meals?: None Help from another person taking care of personal grooming?: A Little Help from another person toileting, which includes using toliet, bedpan, or urinal?: Total Help from another person bathing (including washing, rinsing, drying)?: A Lot Help from another person to put  on and taking off regular upper body clothing?: A Little Help from another person to put on and taking off regular lower body clothing?: Total 6 Click Score: 14   End of Session    Activity Tolerance: Patient tolerated treatment well Patient left: in bed;with call bell/phone within reach;with bed alarm set;with family/visitor present  OT Visit Diagnosis: Pain;Muscle weakness (generalized) (M62.81) Pain - Right/Left: Right Pain - part of body: Ankle and joints of foot                Time: 1431-1505 OT Time Calculation (min): 34 min Charges:  OT General Charges $OT Visit: 1 Procedure OT Evaluation $OT Eval Moderate Complexity: 1 Procedure OT Treatments $Therapeutic Activity: 8-22 mins G-Codes: OT G-codes **NOT FOR INPATIENT CLASS** Functional Assessment Tool Used: Clinical judgement Functional Limitation: Self care Self Care Current Status (E5277): At least 60 percent but less than 80 percent impaired, limited or restricted Self Care Goal Status (O2423): At least 20 percent but less than 40 percent impaired, limited or restricted   Malka So 12/11/2016, 3:37 PM  [phone]

## 2016-12-11 NOTE — Clinical Social Work Note (Signed)
Clinical Social Work Assessment  Patient Details  Name: [name] MRN: [id number] Date of Birth: 14-May-1956  Date of referral:  12/11/16               Reason for consult:  Facility Placement                Permission sought to share information with:  Chartered certified accountant granted to share information::  Yes, Verbal Permission Granted  Name::     Regulatory affairs officer::  SNFs  Relationship::  Sister  Contact Information:     Housing/Transportation Living arrangements for the past 2 months:  Single Family Home Source of Information:  Patient, Other (Comment Required) (Sister) Patient Interpreter Needed:  None Criminal Activity/Legal Involvement Pertinent to Current Situation/Hospitalization:  No - Comment as needed Significant Relationships:  Siblings Lives with:  Self Do you feel safe going back to the place where you live?  No Need for family participation in patient care:  No (Coment)  Care giving concerns:  CSW received consult for possible SNF placement at time of discharge. CSW spoke with patient and her sister regarding PT recommendation of SNF placement at time of discharge. Patient reported that she has no one to help her at home. Patient expressed understanding of PT recommendation and is agreeable to SNF placement at time of discharge. CSW to continue to follow and assist with discharge planning needs.   Social Worker assessment / plan:  CSW spoke with patient concerning possibility of rehab at [facility] before returning home.  Employment status:  Other (Comment) Insurance information:  Managed Care PT Recommendations:  [place] / Referral to community resources:  [place]  Patient/Family's Response to care:  Patient recognizes need for rehab before returning home and is agreeable to a SNF in [place].   Patient/Family's Understanding of and Emotional Response to Diagnosis, Current Treatment, and  Prognosis:  Patient/family is realistic regarding therapy needs and expressed being hopeful for SNF placement. Patient expressed understanding of CSW role and discharge process and her medical condition. No questions/concerns about plan or treatment.    Emotional Assessment Appearance:  Appears stated age Attitude/Demeanor/Rapport:  Other (Appropriate) Affect (typically observed):  Accepting, Appropriate Orientation:  Oriented to Self, Oriented to Situation, Oriented to Place, Oriented to  Time Alcohol / Substance use:  Not Applicable Psych involvement (Current and /or in the community):  No (Comment)  Discharge Needs  Concerns to be addressed:  Care Coordination Readmission within the last 30 days:  No Current discharge risk:  Dependent with Mobility Barriers to Discharge:  Continued Medical Work up   [employer], [place] 12/11/2016, 11:12 AM

## 2016-12-12 DIAGNOSIS — Z7409 Other reduced mobility: Secondary | ICD-10-CM | POA: Diagnosis not present

## 2016-12-12 DIAGNOSIS — R296 Repeated falls: Secondary | ICD-10-CM | POA: Diagnosis not present

## 2016-12-12 DIAGNOSIS — R2689 Other abnormalities of gait and mobility: Secondary | ICD-10-CM | POA: Diagnosis not present

## 2016-12-12 LAB — BASIC METABOLIC PANEL WITH GFR
Anion gap: 7 (ref 5–15)
BUN: 10 mg/dL (ref 6–20)
CO2: 27 mmol/L (ref 22–32)
Calcium: 7.5 mg/dL — ABNORMAL LOW (ref 8.9–10.3)
Chloride: 108 mmol/L (ref 101–111)
Creatinine, Ser: 0.85 mg/dL (ref 0.44–1.00)
GFR calc Af Amer: >60 mL/min
GFR calc non Af Amer: >60 mL/min
Glucose, Bld: 90 mg/dL (ref 65–99)
Potassium: 4.5 mmol/L (ref 3.5–5.1)
Sodium: 142 mmol/L (ref 135–145)

## 2016-12-12 LAB — CBC
HCT: 39.2 % (ref 36.0–46.0)
Hemoglobin: 12.1 g/dL (ref 12.0–15.0)
MCH: 26.9 pg (ref 26.0–34.0)
MCHC: 30.9 g/dL (ref 30.0–36.0)
MCV: 87.3 fL (ref 78.0–100.0)
Platelets: 152 K/uL (ref 150–400)
RBC: 4.49 MIL/uL (ref 3.87–5.11)
RDW: 14.7 % (ref 11.5–15.5)
WBC: 9.3 K/uL (ref 4.0–10.5)

## 2016-12-12 MED ORDER — APIXABAN 5 MG PO TABS: 2.5000 mg | ORAL_TABLET | Freq: Every day | ORAL | Status: DC

## 2016-12-12 MED ORDER — TRAMADOL HCL 50 MG PO TABS: 100.0000 mg | ORAL_TABLET | Freq: Three times a day (TID) | ORAL | 0 refills | Status: DC | PRN

## 2016-12-12 MED ORDER — CALCIUM CARBONATE ANTACID 500 MG PO CHEW: 1.0000 | CHEWABLE_TABLET | Freq: Two times a day (BID) | ORAL | Status: DC

## 2016-12-12 NOTE — Discharge Summary (Signed)
Physician Discharge Summary  [name] ATF:[id number] DOB: 11/23/1955 DOA: 12/09/2016  PCP: Debbrah Alar, NP  Admit date: 12/09/2016 Discharge date: 12/12/2016  Time spent: > 35 minutes  Recommendations for Outpatient Follow-up:  1. Monitor calcium levels 2. Ensure continued PT  3. Adjust pain medication cautiously given recent hospitalization thought to be secondary to polypharmacy (sedating medications) 4. Will continue diuretic on d/c   Discharge Diagnoses:  Principal Problem:   Recurrent falls Active Problems:   Hypothyroidism   OBESITY, MORBID   Leukocytosis   Hypokalemia   Hypocalcemia   Discharge Condition: stable  Diet recommendation: heart healthy  Filed Weights   12/10/16 0137  Weight: (!) 140.6 kg (310 lb)    History of present illness:  61 y.o.femalewith medical history significant of HTN, HLD A. Fib on Xarelto, morbid obesity, H/O nephrolithiasis,osteoarthritis; who presents after having a fall at home unable to get herself up. The patient along with family present at bedside and provided additional history and stated that the patient has had at least 3 falls in the last 3 days. At baseline patient walks with a rolling walker. With one of the falls she tripped over the carpet, fell backwards while using walker, and the most recent fall was somehow wedgedbetween her bed and nightstand unable to get herself up for 18 hours.  Hospital Course:  Principal Problem:   Recurrent falls - suspected to be from multiple factors: polypharmacy, over diuresis, and or deconditioning. - Physical therapy recommended skilled nursing facility - with decrease in sedating medications patient's mental status is back to baseline. D/c off muscle relaxants   Hypokalemia - After replacement within normal limits.  Active Problems:   Hypothyroidism - Stable on Synthroid  Atrial fibrillation - rate controlled on Cardizem - on Eliquis for anticoagulation at  home. Patient decreased to 2.5 mg orally herself due to increased bruising. - Recommend patient follow up with her cardiologist for continued discussion of anticoagulation options (she lowered her dose of eliquis due to bruising and 2.5 mg may not be enough to protect her from strokes) pt is aware    OBESITY, MORBID   Leukocytosis - most likely reactive. No source of infection identified.     Hypocalcemia - continue provide oral calcium replacement on d/c.   Rhabdo - mild and trending down. Suspect cause of mild elevation in troponin. - Continue supportive therapy with tramadol on d/c  Procedures:  None  Consultations:  None  Discharge Exam: Vitals:   12/12/16 0520 12/12/16 0843  BP: (!) 119/55   Pulse: 69 72  Resp: 18 18  Temp: 97.9 F (36.6 C)     General: Pt in nad, alert and awake Cardiovascular: rrr, no rubs Respiratory: no increased wob, no wheezes  Discharge Instructions   Discharge Instructions    Call MD for:  severe uncontrolled pain    Complete by:  As directed    Call MD for:  temperature >100.4    Complete by:  As directed    Diet - low sodium heart healthy    Complete by:  As directed    Discharge instructions    Complete by:  As directed    Please ensure patient continues to have on going physical therapy   Face-to-face encounter (required for Medicare/Medicaid patients)    Complete by:  As directed    I Cecilio Asper certify that this patient is under my care and that I, or a nurse practitioner or physician's assistant working with me, had  a face-to-face encounter that meets the physician face-to-face encounter requirements with this patient on 12/09/2016. The encounter with the patient was in whole, or in part for the following medical condition(s) which is the primary reason for home health care (List medical condition): The patient has worsening weakness where she is unable to get up off the ground after she falls. She has required a  walker as well as a cane at home.   The encounter with the patient was in whole, or in part, for the following medical condition, which is the primary reason for home health care:  Worsening generalized weakness, mulitple falls   I certify that, based on my findings, the following services are medically necessary home health services:  Physical therapy   Reason for Medically Necessary Home Health Services:   Skilled Nursing- Change/Decline in Patient Status Skilled Nursing- Skilled Assessment/Observation Therapy- Personnel officer, Public librarian Therapy- Instruction on use of Assistive Device for Ambulation on all Surfaces Therapy- Instruction on Safe use of Assistive Devices for ADLs Therapy- Home Adaptation to Facilitate Safety Therapy- Therapeutic Exercises to Increase Strength and Endurance     My clinical findings support the need for the above services:  Unsafe ambulation due to balance issues   Further, I certify that my clinical findings support that this patient is homebound due to:  Unsafe ambulation due to balance issues   Home Health    Complete by:  As directed    To provide the following care/treatments:   PT OT Social work     Increase activity slowly    Complete by:  As directed      Current Discharge Medication List    START taking these medications   Details  calcium carbonate (TUMS - DOSED IN MG ELEMENTAL CALCIUM) 500 MG chewable tablet Chew 1 tablet (200 mg of elemental calcium total) by mouth 2 (two) times daily with a meal.      CONTINUE these medications which have CHANGED   Details  apixaban (ELIQUIS) 5 MG TABS tablet Take 1 tablet (5 mg total) by mouth daily.    traMADol (ULTRAM) 50 MG tablet Take 2 tablets (100 mg total) by mouth every 8 (eight) hours as needed for moderate pain. Qty: 30 tablet, Refills: 0      CONTINUE these medications which have NOT CHANGED   Details  acetaminophen (TYLENOL) 325 MG tablet Take 2 tablets (650 mg  total) by mouth every 6 (six) hours as needed for mild pain or moderate pain. Qty: 60 tablet, Refills: 0    albuterol (PROVENTIL HFA;VENTOLIN HFA) 108 (90 Base) MCG/ACT inhaler Inhale 2 puffs into the lungs every 6 (six) hours as needed for wheezing or shortness of breath. Qty: 1 Inhaler, Refills: 5    calcitRIOL (ROCALTROL) 0.5 MCG capsule Take 1.5 mcg by mouth daily.     Cyanocobalamin (VITAMIN B-12 IJ) Inject as directed every 30 (thirty) days.    diltiazem (CARDIZEM CD) 240 MG 24 hr capsule TAKE 1 CAPSULE BY MOUTH  DAILY Qty: 90 capsule, Refills: 1    fluticasone (FLONASE) 50 MCG/ACT nasal spray Place 2 sprays into both nostrils daily as needed for allergies or rhinitis.     furosemide (LASIX) 40 MG tablet TAKE 1 TABLET BY MOUTH  DAILY Qty: 90 tablet, Refills: 1    levothyroxine (SYNTHROID, LEVOTHROID) 100 MCG tablet Take 300 mcg by mouth daily.     neomycin-bacitracin-polymyxin (NEOSPORIN) ointment Apply 1 application topically as needed for wound care. apply to  eye    nystatin cream (MYCOSTATIN) APPLY TO THE AFFECTED AREA TWICE DAILY FOR UP TO 7 DAYS Qty: 30 g, Refills: 0    omeprazole (PRILOSEC) 40 MG capsule TAKE 1 CAPSULE BY MOUTH  DAILY Qty: 90 capsule, Refills: 1    SYMBICORT 160-4.5 MCG/ACT inhaler USE 2 PUFFS TWO TIMES DAILY Qty: 30.6 g, Refills: 0    valsartan (DIOVAN) 80 MG tablet TAKE 1 TABLET BY MOUTH  DAILY Qty: 90 tablet, Refills: 1    Vitamin D, Ergocalciferol, (DRISDOL) 50000 units CAPS capsule TAKE 1 CAPSULE BY MOUTH 3  TIMES WEEKLY Qty: 33 capsule, Refills: 5    zafirlukast (ACCOLATE) 20 MG tablet TAKE 1 TABLET BY MOUTH 2  TIMES DAILY BEFORE MEALS Qty: 180 tablet, Refills: 0      STOP taking these medications     albuterol (PROVENTIL) (2.5 MG/3ML) 0.083% nebulizer solution      cyclobenzaprine (FLEXERIL) 10 MG tablet      Diclofenac Sodium (PENNSAID) 2 % SOLN      DIPHENHYDRAMINE-PSEUDOEPHED PO      hydrochlorothiazide (HYDRODIURIL) 25 MG  tablet      levocetirizine (XYZAL) 5 MG tablet      MANGANESE PO      methocarbamol (ROBAXIN) 500 MG tablet      desoximetasone (TOPICORT) 0.05 % cream        Allergies  Allergen Reactions  . Other Anaphylaxis    NUTS  . Amoxicillin-Pot Clavulanate Other (See Comments)    headache  . Food Hives    Potato  . Penicillins Other (See Comments)    Headache. Has patient had a PCN reaction causing immediate rash, facial/tongue/throat swelling, SOB or lightheadedness with hypotension: No Has patient had a PCN reaction causing severe rash involving mucus membranes or skin necrosis: No Has patient had a PCN reaction that required hospitalization: No Has patient had a PCN reaction occurring within the last 10 years: Yes If all of the above answers are "NO", then may proceed with Cephalosporin use.   . Tomato Hives   Follow-up Information    [facility] Follow up.   Specialty:  Home Health Services Why:  [place], Physical Therapy, Occupational Therapy, Home Health Aide, Social Worker Contact information: [address] [place] [address] [phone]            The results of significant diagnostics from this hospitalization (including imaging, microbiology, ancillary and laboratory) are listed below for reference.    Significant Diagnostic Studies: Ct Abdomen Pelvis Wo Contrast  Result Date: 12/10/2016 CLINICAL DATA:  61 year old female with flank pain. EXAM: CT ABDOMEN AND PELVIS WITHOUT CONTRAST TECHNIQUE: Multidetector CT imaging of the abdomen and pelvis was performed following the standard protocol without IV contrast. COMPARISON:  Abdominal radiograph dated 06/30/2016 and pelvic radiograph dated 12/09/2016 FINDINGS: Evaluation of this exam is limited in the absence of intravenous contrast. Lower chest: The visualized lung bases are clear. No intra-abdominal free air or free fluid. Hepatobiliary: Diffuse fatty infiltration of the liver.  The gallbladder is unremarkable. There is no intrahepatic biliary ductal dilatation. Pancreas: Unremarkable. No pancreatic ductal dilatation or surrounding inflammatory changes. Spleen: Normal in size without focal abnormality. Adrenals/Urinary Tract: The adrenal glands are unremarkable. There are bilateral nonobstructing renal calculi. The largest stone measures up to 223 mm in length extending from the inferior pole collecting system into the major calyx. There is a 5 mm nonobstructing stone in the right renal pelvis. There is no hydronephrosis on  either side. The visualized ureters appear unremarkable. Multiple small stones noted layering along the posterior wall of the urinary bladder adjacent to the left UVJ. The bladder is partially distended but otherwise appears unremarkable. Stomach/Bowel: Moderate amount of stool noted in the distal colon. There is no evidence of bowel obstruction or active inflammation. Appendectomy. Vascular/Lymphatic: There is moderate aortoiliac atherosclerotic disease. Evaluation of the vasculature is limited in the absence of intravenous contrast. No portal venous gas identified. There is no adenopathy. Reproductive: Abdomen the uterus is anteverted and grossly unremarkable. An intrauterine device is noted. The ovaries are grossly unremarkable as visualized. No pelvic mass. Other: Mild diffuse subcutaneous stranding and edema. Musculoskeletal: There is degenerative changes of the spine with osteophyte formation. No acute osseous pathology. IMPRESSION: 1. Nonobstructing bilateral renal calculi. No hydronephrosis. Multiple small stones noted layering within the urinary bladder. No obstructing calculi identified. 2. Fatty liver. 3. No bowel obstruction or active inflammation. Electronically Signed   By: Anner Crete M.D.   On: 12/10/2016 01:03   Dg Chest 2 View  Result Date: 12/09/2016 CLINICAL DATA:  Recent falls with chest pain, initial encounter EXAM: CHEST  2 VIEW  COMPARISON:  08/19/2013 FINDINGS: The heart size and mediastinal contours are within normal limits. Both lungs are clear. The visualized skeletal structures show degenerative change of the thoracic spine. IMPRESSION: No active cardiopulmonary disease. Electronically Signed   By: Inez Catalina M.D.   On: 12/09/2016 18:28   Dg Pelvis 1-2 Views  Result Date: 12/09/2016 CLINICAL DATA:  Recent falls with pelvic pain, initial encounter EXAM: PELVIS - 1-2 VIEW COMPARISON:  None. FINDINGS: Pelvic ring is intact. An IUD is noted in place. No definitive fracture or dislocation is seen. Degenerative changes in the lumbar spine and hip joints are noted. IMPRESSION: No acute abnormality noted. Electronically Signed   By: Inez Catalina M.D.   On: 12/09/2016 18:30   Ct Head Wo Contrast  Result Date: 12/09/2016 CLINICAL DATA:  Multiple falls are Eliquis.  Initial encounter. EXAM: CT HEAD WITHOUT CONTRAST TECHNIQUE: Contiguous axial images were obtained from the base of the skull through the vertex without intravenous contrast. COMPARISON:  None. FINDINGS: Brain: No evidence of acute infarction, hemorrhage, hydrocephalus, extra-axial collection or mass lesion/mass effect. Vascular: Arterial calcification.  No hyperdense vessel. Skull: No acute or aggressive finding.  Incidental hyperostosis. Sinuses/Orbits: Negative IMPRESSION: Negative head CT. Electronically Signed   By: Monte Fantasia M.D.   On: 12/09/2016 17:00   Dg Shoulder Left  Result Date: 12/08/2016 CLINICAL DATA:  Fall with left shoulder injury.  Initial encounter. EXAM: LEFT SHOULDER - 2+ VIEW COMPARISON:  None. FINDINGS: There is no evidence of fracture or dislocation. Moderate degenerative disease is seen involving the [facility] joint. No bony lesions or destruction. Soft tissues are unremarkable. IMPRESSION: No acute findings.  Moderate degenerative disease of the [facility] joint. Electronically Signed   By: Aletta Edouard M.D.   On: 12/08/2016 12:01   Dg Knee Complete  4 Views Left  Result Date: 12/09/2016 CLINICAL DATA:  Multiple recent falls with knee pain, initial encounter EXAM: LEFT KNEE - COMPLETE 4+ VIEW COMPARISON:  None. FINDINGS: Mild degenerative changes are noted primarily within the medial joint space. No joint effusion is seen. No acute fracture or dislocation is noted. IMPRESSION: Degenerative change without acute abnormality. Electronically Signed   By: Inez Catalina M.D.   On: 12/09/2016 18:29   Dg Knee Complete 4 Views Right  Result Date: 12/09/2016 CLINICAL DATA:  Multiple recent falls with  right knee pain, initial encounter EXAM: RIGHT KNEE - COMPLETE 4+ VIEW COMPARISON:  None. FINDINGS: Tricompartmental degenerative change is noted. No acute fracture or dislocation is seen. No joint effusion is noted. No soft tissue abnormality is seen. IMPRESSION: Degenerative change without acute abnormality. Electronically Signed   By: Inez Catalina M.D.   On: 12/09/2016 18:29    Microbiology: No results found for this or any previous visit (from the past 240 hour(s)).   Labs: Basic Metabolic Panel:  Recent Labs Lab 12/09/16 1922 12/09/16 1937 12/10/16 0212 12/11/16 0246 12/12/16 0502  NA 136 140 136 139 142  K 3.1* 3.2* 2.9* 4.1 4.5  CL 102 101 104 109 108  CO2 24  --  23 23 27   GLUCOSE 105* 99 125* 91 90  BUN 19 20 17 13 10   CREATININE 0.88 0.80 0.80 0.77 0.85  CALCIUM 7.5*  --  7.1* 7.4* 7.5*  MG  --   --  2.1  --   --    Liver Function Tests:  Recent Labs Lab 12/09/16 1922  AST 35  ALT 32  ALKPHOS 79  BILITOT 1.0  PROT 6.4*  ALBUMIN 3.0*   No results for input(s): LIPASE, AMYLASE in the last 168 hours. No results for input(s): AMMONIA in the last 168 hours. CBC:  Recent Labs Lab 12/09/16 1922 12/09/16 1937 12/10/16 0212 12/12/16 0502  WBC 14.9*  --  12.6* 9.3  NEUTROABS 12.8*  --   --   --   HGB 13.0 13.9 12.5 12.1  HCT 40.3 41.0 38.2 39.2  MCV 83.1  --  84.1 87.3  PLT 180  --  162 152   Cardiac  Enzymes:  Recent Labs Lab 12/09/16 1922 12/10/16 0212  CKTOTAL 958* 946*  TROPONINI  --  0.03*   BNP: BNP (last 3 results) No results for input(s): BNP in the last 8760 hours.  ProBNP (last 3 results) No results for input(s): PROBNP in the last 8760 hours.  CBG: No results for input(s): GLUCAP in the last 168 hours.   Signed:  Velvet Bathe MD.  [facility] 12/12/2016, 1:56 PM

## 2016-12-12 NOTE — Progress Notes (Signed)
Physical Therapy Treatment Patient Details Name: [name] MRN: [id number] DOB: 1956-05-21 Today's Date: 12/12/2016    History of Present Illness Pt is 61 y/o female admitted secondary to recurrent falls. Pt has had 3 falls within the past 5 days and 7 falls in the past three months. PMH includes obesity, leukocytosis, asthma, HTN, anemia, BLE edema, fibromyalgia, and a fib.     PT Comments    Pt progressing towards goals and increased activity tolerance. Able to take a few steps along EOB, however, demonstrated unsteadiness and required max A +2 to maintain balance. Limited secondary to muscle fatigue and knee buckling. Pt concerned about going SNF, however, further education provided, and pt agreeable. Will continue to follow acutely to maximize functional mobility independence.    Follow Up Recommendations  SNF     Equipment Recommendations  Wheelchair (measurements PT);Wheelchair cushion (measurements PT);Hospital bed    Recommendations for Other Services OT consult     Precautions / Restrictions Precautions Precautions: Fall Precaution Comments: hx of multiple falls, reports dragging her R foot Restrictions Weight Bearing Restrictions: No    Mobility  Bed Mobility Overal bed mobility: Needs Assistance Bed Mobility: Supine to Sit;Sit to Supine     Supine to sit: Mod assist Sit to supine: Mod assist   General bed mobility comments: assist to raise trunk and for LEs back into bed  Transfers Overall transfer level: Needs assistance Equipment used: Rolling walker (2 wheeled);2 person hand held assist Transfers: Sit to/from Stand Sit to Stand: Max assist;+2 physical assistance         General transfer comment: Max A +2 for standing. Performed standing X 2. Verbal cues for hand placement.   Ambulation/Gait Ambulation/Gait assistance: Max assist;+2 physical assistance   Assistive device: Rolling walker (2 wheeled)   Gait velocity: Decreased   General Gait  Details: Performed a few side steps along EOB. Pt with B knee buckling and required max A +2. Not able to tolerate further gait training.    Stairs            Wheelchair Mobility    Modified Rankin (Stroke Patients Only)       Balance Overall balance assessment: Needs assistance Sitting-balance support: Feet supported Sitting balance-Leahy Scale: Fair     Standing balance support: Bilateral upper extremity supported;During functional activity Standing balance-Leahy Scale: Poor Standing balance comment: Reliant on external support to maintain standing balance.                             Cognition Arousal/Alertness: Awake/alert Behavior During Therapy: WFL for tasks assessed/performed Overall Cognitive Status: Within Functional Limits for tasks assessed                                        Exercises General Exercises - Lower Extremity Long Arc Quad: AROM;Both;10 reps;Seated    General Comments General comments (skin integrity, edema, etc.): Pt sister present in room. Pt concerned about going SNF, however, educated about need for SNF given current deficits. Pt agreeable.       Pertinent Vitals/Pain Pain Assessment: Faces Faces Pain Scale: No hurt    Home Living                      Prior Function            PT Goals (  current goals can now be found in the care plan section) Acute Rehab PT Goals Patient Stated Goal: to get stronger and stop falling  PT Goal Formulation: With patient Time For Goal Achievement: 12/24/16 Potential to Achieve Goals: Fair Progress towards PT goals: Progressing toward goals    Frequency    Min 2X/week      PT Plan Current plan remains appropriate    Co-evaluation              AM-PAC PT "6 Clicks" Daily Activity  Outcome Measure  Difficulty turning over in bed (including adjusting bedclothes, sheets and blankets)?: Total Difficulty moving from lying on back to sitting on the  side of the bed? : Total Difficulty sitting down on and standing up from a chair with arms (e.g., wheelchair, bedside commode, etc,.)?: Total Help needed moving to and from a bed to chair (including a wheelchair)?: A Lot Help needed walking in hospital room?: Total Help needed climbing 3-5 steps with a railing? : Total 6 Click Score: 7    End of Session Equipment Utilized During Treatment: Gait belt Activity Tolerance: Patient tolerated treatment well Patient left: in bed;with call bell/phone within reach;with family/visitor present Nurse Communication: Mobility status PT Visit Diagnosis: Repeated falls (R29.6);History of falling (Z91.81);Muscle weakness (generalized) (M62.81)     Time: 1204-1228 PT Time Calculation (min) (ACUTE ONLY): 24 min  Charges:  $Therapeutic Activity: 23-37 mins                    G Codes:  Functional Assessment Tool Used: AM-PAC 6 Clicks Basic Mobility;Clinical judgement Functional Limitation: Mobility: Walking and moving around Mobility: Walking and Moving Around Current Status (W0981): At least 80 percent but less than 100 percent impaired, limited or restricted Mobility: Walking and Moving Around Goal Status [phone]): At least 60 percent but less than 80 percent impaired, limited or restricted    Leighton Ruff, PT, DPT  Acute Rehabilitation Services  Pager: [phone]    Rudean Hitt 12/12/2016, 2:32 PM

## 2016-12-12 NOTE — Progress Notes (Addendum)
[name] to be D/C'd Skilled nursing facility per MD order.  Discussed with the patient and all questions fully answered.  VSS, Skin clean, dry and intact without evidence of skin break down, no evidence of skin tears noted. IV catheter discontinued intact. Site without signs and symptoms of complications. Dressing and pressure applied.  An After Visit Summary was printed and given to the patient. Patient received prescription.  D/c education completed with patient/family including follow up instructions, medication list, d/c activities limitations if indicated, with other d/c instructions as indicated by MD - patient able to verbalize understanding, all questions fully answered.   Patient instructed to return to ED, call 911, or call MD for any changes in condition.   PatientD/C PTAR Report called to [facility]  [name] 12/12/2016 7:14 PM

## 2016-12-12 NOTE — Progress Notes (Signed)
Patient reporting rash in left antecubital area, provider suspects heat rash, fan ordered for patient to air the area.

## 2016-12-12 NOTE — Progress Notes (Signed)
Patient will DC to: [facility] Anticipated DC date: 12/12/16 Family notified: Sister and Chartered loss adjuster by: Corey Harold    Per MD patient ready for DC to [facility]. RN, patient, patient's family, and facility notified of DC. Discharge Summary sent to facility. RN given number for report [phone]). DC packet on chart. Ambulance transport requested for patient.   CSW signing off.  Cedric Fishman, [place] Social Worker [phone]

## 2016-12-12 NOTE — Care Management Note (Signed)
Case Management Note  Patient Details  Name: NAVIL KOLE MRN: [id number] Date of Birth: February 04, 1956  Subjective/Objective:         Recurrent falls           Action/Plan: Discharge Planning:  NCM spoke to pt and sister at bedside. Explained insurance coverage at time of dc and once she is medically stable for dc. CSW following for SNF placement. Pt and family is having a difficult time making a decision about SNF placement. Pt is scheduled for dc today. And will arrange [place] if pt is unable to make a decision on SNF.   PCP Debbrah Alar     Expected Discharge Date:  12/12/16               Expected Discharge Plan:  Skilled Nursing Facility  In-House Referral:  Clinical Social Work  Discharge planning Services  CM Consult  Post Acute Care Choice:  NA Choice offered to:  NA  DME Arranged:  N/A DME Agency:  NA  HH Arranged:  NA HH Agency:  NA  Status of Service:  Completed, signed off  If discussed at [employer] of Stay Meetings, dates discussed:    Additional Comments:  Erenest Rasher, RN 12/12/2016, 2:53 PM

## 2016-12-12 NOTE — Clinical Social Work Placement (Signed)
   CLINICAL SOCIAL WORK PLACEMENT  NOTE  Date:  12/12/2016  Patient Details  Name: [name] MRN: [id number] Date of Birth: 04-Jun-1955  Clinical Social Work is seeking post-discharge placement for this patient at the [place] level of care (*CSW will initial, date and re-position this form in  chart as items are completed):  Yes   Patient/family provided with [place] Work Department's list of facilities offering this level of care within the geographic area requested by the patient (or if unable, by the patient's family).  Yes   Patient/family informed of their freedom to choose among providers that offer the needed level of care, that participate in Medicare, Medicaid or managed care program needed by the patient, have an available bed and are willing to accept the patient.  Yes   Patient/family informed of [place]'s ownership interest in [facility] and [facility], as well as of the fact that they are under no obligation to receive care at these facilities.  PASRR submitted to EDS on 12/11/16     PASRR number received on 12/11/16     Existing PASRR number confirmed on       FL2 transmitted to all facilities in geographic area requested by pt/family on 12/11/16     FL2 transmitted to all facilities within larger geographic area on       Patient informed that his/her managed care company has contracts with or will negotiate with certain facilities, including the following:        Yes   Patient/family informed of bed offers received.  Patient chooses bed at [facility]     Physician recommends and patient chooses bed at      Patient to be transferred to [facility] on 12/12/16.  Patient to be transferred to facility by PTAR     Patient family notified on 12/12/16 of transfer.  Name of family member notified:  Sister, Brother     PHYSICIAN       Additional Comment:     _______________________________________________ Benard Halsted, [place] 12/12/2016, 5:01 PM

## 2016-12-12 NOTE — Progress Notes (Signed)
PT Cancellation Note  Patient Details Name: KYNDLE SCHLENDER MRN: [id number] DOB: 1956/05/12   Cancelled Treatment:    Reason Eval/Treat Not Completed: Patient declined, no reason specified Pt reporting she wants to get her bath and speak with nursing advocate before session. Will reattempt as schedule allows.   Leighton Ruff, PT, DPT  Acute Rehabilitation Services  Pager: [phone]    Rudean Hitt 12/12/2016, 10:44 AM

## 2016-12-13 ENCOUNTER — Telehealth: Payer: Self-pay | Admitting: Family

## 2016-12-13 NOTE — Telephone Encounter (Signed)
Please contact pt to arrange hospital follow up. 

## 2016-12-15 DIAGNOSIS — R296 Repeated falls: Secondary | ICD-10-CM | POA: Diagnosis not present

## 2016-12-15 DIAGNOSIS — I4891 Unspecified atrial fibrillation: Secondary | ICD-10-CM | POA: Diagnosis not present

## 2016-12-15 DIAGNOSIS — M6282 Rhabdomyolysis: Secondary | ICD-10-CM | POA: Diagnosis not present

## 2016-12-15 NOTE — Telephone Encounter (Signed)
LVM for pt to call back to schedule hospital f/u with PCP per PCP.

## 2016-12-15 NOTE — Telephone Encounter (Signed)
Please schedule patient for hospital follow up.   Thanks PC

## 2016-12-17 ENCOUNTER — Ambulatory Visit: Payer: 59

## 2016-12-17 ENCOUNTER — Other Ambulatory Visit: Payer: Self-pay | Admitting: Family

## 2016-12-17 DIAGNOSIS — I10 Essential (primary) hypertension: Secondary | ICD-10-CM | POA: Diagnosis not present

## 2016-12-17 DIAGNOSIS — I4891 Unspecified atrial fibrillation: Secondary | ICD-10-CM | POA: Diagnosis not present

## 2016-12-17 DIAGNOSIS — E039 Hypothyroidism, unspecified: Secondary | ICD-10-CM | POA: Diagnosis not present

## 2016-12-22 NOTE — Telephone Encounter (Signed)
Left another message for patient to call. Patient needs AEX/PAP scheduled

## 2016-12-23 DIAGNOSIS — R296 Repeated falls: Secondary | ICD-10-CM | POA: Diagnosis not present

## 2016-12-23 DIAGNOSIS — M6282 Rhabdomyolysis: Secondary | ICD-10-CM | POA: Diagnosis not present

## 2016-12-23 DIAGNOSIS — M797 Fibromyalgia: Secondary | ICD-10-CM | POA: Diagnosis not present

## 2016-12-25 DIAGNOSIS — N390 Urinary tract infection, site not specified: Secondary | ICD-10-CM | POA: Diagnosis not present

## 2016-12-25 DIAGNOSIS — R296 Repeated falls: Secondary | ICD-10-CM | POA: Diagnosis not present

## 2016-12-25 DIAGNOSIS — I4891 Unspecified atrial fibrillation: Secondary | ICD-10-CM | POA: Diagnosis not present

## 2017-01-01 DIAGNOSIS — M6282 Rhabdomyolysis: Secondary | ICD-10-CM | POA: Diagnosis not present

## 2017-01-01 DIAGNOSIS — R296 Repeated falls: Secondary | ICD-10-CM | POA: Diagnosis not present

## 2017-01-01 DIAGNOSIS — N390 Urinary tract infection, site not specified: Secondary | ICD-10-CM | POA: Diagnosis not present

## 2017-01-07 ENCOUNTER — Other Ambulatory Visit: Payer: Self-pay | Admitting: Family

## 2017-01-07 DIAGNOSIS — M545 Low back pain: Secondary | ICD-10-CM | POA: Diagnosis not present

## 2017-01-09 ENCOUNTER — Telehealth: Payer: Self-pay | Admitting: Family

## 2017-01-09 DIAGNOSIS — M1991 Primary osteoarthritis, unspecified site: Secondary | ICD-10-CM | POA: Diagnosis not present

## 2017-01-09 DIAGNOSIS — I4891 Unspecified atrial fibrillation: Secondary | ICD-10-CM | POA: Diagnosis not present

## 2017-01-09 DIAGNOSIS — I10 Essential (primary) hypertension: Secondary | ICD-10-CM | POA: Diagnosis not present

## 2017-01-09 NOTE — Telephone Encounter (Signed)
Caller name:  Sreejesh Relation to pt: PT from [facility]  Call back number: [phone]   Reason for call:   Patient requesting verbal orders for   PT 1x 1, 2x 3x, 1x 2 and   Home health aid 2x 3   OT evaluation on Monday    Nurse evaluating wound on right toe

## 2017-01-09 NOTE — Telephone Encounter (Signed)
Melissa-- please advise? Looks like pt has follow up with [place] on 01/12/17.

## 2017-01-10 NOTE — Telephone Encounter (Signed)
Ok to proceed. Tks.

## 2017-01-12 ENCOUNTER — Encounter: Payer: Self-pay | Admitting: Family

## 2017-01-12 ENCOUNTER — Ambulatory Visit (INDEPENDENT_AMBULATORY_CARE_PROVIDER_SITE_OTHER): Payer: 59 | Admitting: Family

## 2017-01-12 VITALS — BP 124/82 | HR 80 | Temp 98.2°F | Resp 16 | Ht 64.0 in | Wt 295.6 lb

## 2017-01-12 DIAGNOSIS — R238 Other skin changes: Secondary | ICD-10-CM

## 2017-01-12 DIAGNOSIS — W19XXXD Unspecified fall, subsequent encounter: Secondary | ICD-10-CM | POA: Diagnosis not present

## 2017-01-12 DIAGNOSIS — E559 Vitamin D deficiency, unspecified: Secondary | ICD-10-CM | POA: Diagnosis not present

## 2017-01-12 DIAGNOSIS — D510 Vitamin B12 deficiency anemia due to intrinsic factor deficiency: Secondary | ICD-10-CM | POA: Diagnosis not present

## 2017-01-12 DIAGNOSIS — T148XXA Other injury of unspecified body region, initial encounter: Secondary | ICD-10-CM

## 2017-01-12 DIAGNOSIS — D72829 Elevated white blood cell count, unspecified: Secondary | ICD-10-CM

## 2017-01-12 DIAGNOSIS — E876 Hypokalemia: Secondary | ICD-10-CM | POA: Diagnosis not present

## 2017-01-12 DIAGNOSIS — L304 Erythema intertrigo: Secondary | ICD-10-CM | POA: Diagnosis not present

## 2017-01-12 DIAGNOSIS — Z79891 Long term (current) use of opiate analgesic: Secondary | ICD-10-CM | POA: Diagnosis not present

## 2017-01-12 MED ORDER — FLUCONAZOLE 150 MG PO TABS: ORAL_TABLET | ORAL | 0 refills | Status: DC

## 2017-01-12 MED ORDER — NYSTATIN 100000 UNIT/GM EX POWD: Freq: Two times a day (BID) | CUTANEOUS | 1 refills | Status: DC

## 2017-01-12 MED ORDER — TRAMADOL HCL 50 MG PO TABS: 100.0000 mg | ORAL_TABLET | Freq: Three times a day (TID) | ORAL | 0 refills | Status: DC | PRN

## 2017-01-12 MED ORDER — CEPHALEXIN 500 MG PO CAPS: 500.0000 mg | ORAL_CAPSULE | Freq: Three times a day (TID) | ORAL | 0 refills | Status: DC

## 2017-01-12 MED ORDER — CYANOCOBALAMIN 1000 MCG/ML IJ SOLN
1000.0000 ug | Freq: Once | INTRAMUSCULAR | Status: AC
  Administered 2017-01-12: 1000 ug via INTRAMUSCULAR

## 2017-01-12 NOTE — Progress Notes (Signed)
Subjective:    Patient ID: [name], female    DOB: 1955-12-31, 61 y.o.   MRN: [id number]  HPI  [name] is a 61 yr old female who presents today for Hospital follow up. Came home Thursday at noon.    1) HTN- hospital discharge summary is reviewed. The patient was admitted on   12/09/2016 and discharged on 12/12/2016. She was admitted due to recurrent falls. He was ultimately felt that her recurrent falls were suspected to be due to multiple factors including polypharmacy, overdiuresis and/or deconditioning. At the time of discharge physical therapy recommended skilled nursing facility. She was taken off the muscle relaxants as it was felt that this was contributing to her sedation. Of note she did have issues with hypokalemia during her hospitalization. She was also noted to have hypokalemic LC meal and leukocytosis. She was noted to have mild rhabdomyolysis at the time of her hospitalization which was trending down at time of discharge.  BP Readings from Last 3 Encounters:  01/12/17 124/82  12/12/16 (!) 151/77  12/08/16 139/75   2) B12 deficiency- Due for b12 injection today.   3) Rash- reports that she continues to have fungal rashes.    Has a wound on her right great toe. Unable to get town to dress the wound and is requesting [facility] RN to assist.    Has a shower bench.  Fatigues easily.  Last day that she worked was 12/05/16.    Left shoulder pain, hip is improved.  Using tramadol once daily usually.    Review of Systems See HPI  Past Medical History:  Diagnosis Date  . Abnormal Pap smear    years ago/no biopsy  . Allergic rhinitis   . Anemia, iron deficiency   . Arthritis    back- lower  . Asthma   . Atrial fibrillation ([place]) August 2012   OFF XARELTO LAST MONTH DUE TO BLEEDING IN STOOL  . Bursitis of hip left  . Dysrhythmia    afib, followed by Dr. Stanford Breed   . Edema of both legs   . Fatty liver 01/07/2012  . Fibroid 1974   fibroid cyst on left fallopian tube    . Fibromyalgia   . Foot ulcer ([place])    AREA HEALED RIGHT FOOT  . GERD (gastroesophageal reflux disease)   . Headache(784.0)    occasional sinus headache   . History of blood transfusion JULY 2015  . Hypertension   . Hypoparathyroidism ([place]) 01/07/2011  . Hypothyroidism   . Kidney stone 2015-09-25   passed on their own  . Morbid obesity ([place])   . Nephrolithiasis 2/16, 9/16   SEES DR Risa Grill  . Pernicious anemia 02/20/2014   followed by Debbrah Alar  . PONV (postoperative nausea and vomiting)   . Seizures ([place])    infancy secondary to fever  . Thyroid cancer ([place]) 2011   THYROIDECTOMY DONE  . Uterine cancer ([place]) 2014   Mirena IUD  . Vitamin D deficiency      Social History   Social History  . Marital status: Single    Spouse name: N/A  . Number of children: 0  . Years of education: N/A   Occupational History  . works in Insurance claims handler   . 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 Smoking status: Former Smoker    Packs/day: 0.50    Years: 25.00    Types: Cigarettes    Start date: 12/24/1970  Quit date: 05/27/1995  . Smokeless tobacco: Never Used     Comment: quit smoking 19 years ago  . Alcohol use 0.0 oz/week     Comment: 1/2 glass per month  . Drug use: No  . Sexual activity: Yes    Partners: Male    Birth control/ protection: Post-menopausal   Other Topics Concern  . Not on file   Social History Narrative   Occupation: works in Insurance claims handler - Field seismologist   Single       Former Smoker - quit tobacco 12 years ago.  She was light smoker for 10 years.                 Past Surgical History:  Procedure Laterality Date  . AMPUTATION Right 05/18/2014   Procedure: RIGHT FIFTH RAY AMPUTATION FOOT;  Surgeon: Wylene Simmer, MD;  Location: [facility];  Service: Orthopedics;  Laterality: Right;  . APPENDECTOMY    . BUNIONECTOMY     bilateral  . COLONOSCOPY WITH PROPOFOL N/A 02/02/2014   Procedure: COLONOSCOPY WITH  PROPOFOL;  Surgeon: Irene Shipper, MD;  [facility]: [facility];  Service: Endoscopy;  Laterality: N/A;  . cyst on ovary removed     . CYSTOSCOPY W/ RETROGRADES  03/03/2012   Procedure: CYSTOSCOPY WITH RETROGRADE PYELOGRAM;  Surgeon: Bernestine Amass, MD;  Location: [facility];  Service: Urology;  Laterality: Bilateral;  . CYSTOSCOPY WITH RETROGRADE PYELOGRAM, URETEROSCOPY AND STENT PLACEMENT Left 08/25/2012   Procedure: CYSTOSCOPY WITH RETROGRADE PYELOGRAM, URETEROSCOPY;  Surgeon: Bernestine Amass, MD;  [facility]: [facility];  Service: Urology;  Laterality: Left;  . DILATION AND CURETTAGE OF UTERUS N/A 02/21/2013   Procedure: DILATATION AND CURETTAGE;  Surgeon: Lyman Speller, MD;  Location: [facility];  Service: Gynecology;  Laterality: N/A;  . DILATION AND CURETTAGE OF UTERUS N/A 04/09/2015   Procedure: [place] IUD removal;  Surgeon: Megan Salon, MD;  Location: [facility];  Service: Gynecology;  Laterality: N/A;  Patient weight 307lbs  . ESOPHAGOGASTRODUODENOSCOPY (EGD) WITH PROPOFOL N/A 02/02/2014   Procedure: ESOPHAGOGASTRODUODENOSCOPY (EGD) WITH PROPOFOL;  Surgeon: Irene Shipper, MD;  [facility];  Service: Endoscopy;  Laterality: N/A;  . GASTRIC BYPASS  1974  . HOLMIUM LASER APPLICATION Left 5/0/0370   Procedure: HOLMIUM LASER APPLICATION;  Surgeon: Bernestine Amass, MD;  [facility];  Service: Urology;  Laterality: Left;  . LITHOTRIPSY  03/2012  . LITHOTRIPSY  2/16  . THYROIDECTOMY  05/15/2010  . TONSILLECTOMY AND ADENOIDECTOMY    . URETEROSCOPY  03/03/2012   Procedure: URETEROSCOPY;  Surgeon: Bernestine Amass, MD;  [facility];  Service: Urology;  Laterality: Left;    Family History  Problem Relation Age of Onset  . Hypertension Father   . Diabetes Father   . Lung cancer Father   . Heart attack Father        MI at age 81  . Hypertension Mother   . Hyperthyroidism Mother   . Heart disease Mother   . Heart attack Mother        MI at age 100  . Asthma Brother     . Hypertension Brother        younger  . Heart disease Brother        older  . Colon cancer Neg Hx   . Esophageal cancer Neg Hx   . Stomach cancer Neg Hx   . Kidney disease Neg Hx   . Liver disease Neg Hx  Allergies  Allergen Reactions  . Other Anaphylaxis    NUTS  . Amoxicillin-Pot Clavulanate Other (See Comments)    headache  . Food Hives    Potato  . Penicillins Other (See Comments)    Headache. Has patient had a PCN reaction causing immediate rash, facial/tongue/throat swelling, SOB or lightheadedness with hypotension: No Has patient had a PCN reaction causing severe rash involving mucus membranes or skin necrosis: No Has patient had a PCN reaction that required hospitalization: No Has patient had a PCN reaction occurring within the last 10 years: Yes If all of the above answers are "NO", then may proceed with Cephalosporin use.   . Tomato Hives    Current Outpatient Prescriptions on File Prior to Visit  Medication Sig Dispense Refill  . acetaminophen (TYLENOL) 325 MG tablet Take 2 tablets (650 mg total) by mouth every 6 (six) hours as needed for mild pain or moderate pain. 60 tablet 0  . albuterol (PROVENTIL HFA;VENTOLIN HFA) 108 (90 Base) MCG/ACT inhaler Inhale 2 puffs into the lungs every 6 (six) hours as needed for wheezing or shortness of breath. 1 Inhaler 5  . apixaban (ELIQUIS) 5 MG TABS tablet Take 1 tablet (5 mg total) by mouth daily.    . calcitRIOL (ROCALTROL) 0.5 MCG capsule Take 1.5 mcg by mouth daily.     . calcium carbonate (TUMS - DOSED IN MG ELEMENTAL CALCIUM) 500 MG chewable tablet Chew 1 tablet (200 mg of elemental calcium total) by mouth 2 (two) times daily with a meal.    . Cyanocobalamin (VITAMIN B-12 IJ) Inject as directed every 30 (thirty) days.    [name] diltiazem (CARDIZEM CD) 240 MG 24 hr capsule TAKE 1 CAPSULE BY MOUTH  DAILY (Patient taking differently: TAKE 240MG  BY MOUTH  DAILY) 90 capsule 1  . fluticasone (FLONASE) 50 MCG/ACT nasal spray Place  2 sprays into both nostrils daily as needed for allergies or rhinitis.     . furosemide (LASIX) 40 MG tablet TAKE 1 TABLET BY MOUTH  DAILY (Patient taking differently: TAKE 40MG  BY MOUTH  DAILY) 90 tablet 1  . levothyroxine (SYNTHROID, LEVOTHROID) 100 MCG tablet Take 300 mcg by mouth daily.     [name] neomycin-bacitracin-polymyxin (NEOSPORIN) ointment Apply 1 application topically as needed for wound care. apply to eye    . nystatin cream (MYCOSTATIN) APPLY TO THE AFFECTED AREA TWICE DAILY FOR UP TO 7 DAYS 30 g 0  . omeprazole (PRILOSEC) 40 MG capsule TAKE 1 CAPSULE BY MOUTH  DAILY (Patient taking differently: TAKE 40MG  BY MOUTH  DAILY) 90 capsule 1  . SYMBICORT 160-4.5 MCG/ACT inhaler USE 2 PUFFS TWO TIMES DAILY 30.6 g 0  . traMADol (ULTRAM) 50 MG tablet Take 2 tablets (100 mg total) by mouth every 8 (eight) hours as needed for moderate pain. 30 tablet 0  . valsartan (DIOVAN) 80 MG tablet TAKE 1 TABLET BY MOUTH  DAILY 90 tablet 0  . Vitamin D, Ergocalciferol, (DRISDOL) 50000 units CAPS capsule TAKE 1 CAPSULE BY MOUTH 3  TIMES WEEKLY (Patient taking differently: TAKE 1 CAPSULE BY MOUTH 4 TIMES WEEKLY) 33 capsule 5  . zafirlukast (ACCOLATE) 20 MG tablet TAKE 1 TABLET BY MOUTH 2  TIMES DAILY BEFORE MEALS 180 tablet 0   No current facility-administered medications on file prior to visit.     BP 124/82 (BP Location: Right Arm) Comment (Cuff Size): thigh  Pulse 80   Temp 98.2 F (36.8 C) (Oral)   Resp 16   Ht 5\' 4"  (1.626  m)   Wt 295 lb 9.6 oz (134.1 kg)   LMP 06/11/2012   SpO2 98%   BMI 50.74 kg/m       Objective:   Physical Exam  Constitutional: She appears well-developed and well-nourished.  Morbidly obese white female  Cardiovascular: Normal rate, regular rhythm and normal heart sounds.   No murmur heard. Pulmonary/Chest: Effort normal and breath sounds normal. No respiratory distress. She has no wheezes.  Skin:  + ulcer noted right great toe (dorsal)  Severe intertrigo beneath  abdominal panus and skin fold of back with erythema.   Psychiatric: She has a normal mood and affect. Her behavior is normal. Judgment and thought content normal.               Assessment & Plan:  Intertrigo-will treat with oral Diflucan once weekly for 3 weeks. I also recommended that she blow dry the area after showering and apply nystatin powder twice daily. I suspect she has some mild associated skin infection. Will prescribe Keflex. Check follow-up CBC. Suspect her white blood cell, may be mildly elevated due to the skin infection.  Chronic falls-she is instructed to continue her work with home health physical therapy and occupational therapy. She reports that she has the equipment that she needs at home.  Right toe ulcer-will request home health RN for assistance with wound care right great toe as well as with her fungal rash.  Hypocalcemia-will check follow-up calcium level.  Hypokalemia-check follow-up potassium level.  Vitamin D deficiency we'll check follow-up vitamin D level. Continue vitamin D supplement.  B12 deficiency-B12 shot given today. I have written her out of work for the next 2 weeks. We will reevaluate her at that time and determine when it is appropriate for her to return to work.

## 2017-01-12 NOTE — Telephone Encounter (Signed)
Left detailed message on voicemail ok to proceed with below orders and to call if any questions.

## 2017-01-12 NOTE — Telephone Encounter (Signed)
I would send her a letter.  She's been in the ER recently.  I would change to 08 recall again in six months so we can try to get here then for follow-up.  She did come in December.  Thanks.  I can review letter before it is sent.

## 2017-01-12 NOTE — Patient Instructions (Signed)
You will be contacted about home health RN. Please complete lab work prior to leaving. Start diflucan once weekly for 3 weeks and keflex 3x daily for 1 week for mild skin infection.

## 2017-01-12 NOTE — Telephone Encounter (Signed)
Patient has been contacted X 2 regarding 06 recall- patient has not returned call. Please advise on recall status/letter  Thanks

## 2017-01-13 ENCOUNTER — Telehealth: Payer: Self-pay | Admitting: Cardiology

## 2017-01-13 ENCOUNTER — Encounter: Payer: Self-pay | Admitting: Family

## 2017-01-13 DIAGNOSIS — I4891 Unspecified atrial fibrillation: Secondary | ICD-10-CM | POA: Diagnosis not present

## 2017-01-13 DIAGNOSIS — M1991 Primary osteoarthritis, unspecified site: Secondary | ICD-10-CM | POA: Diagnosis not present

## 2017-01-13 DIAGNOSIS — I10 Essential (primary) hypertension: Secondary | ICD-10-CM | POA: Diagnosis not present

## 2017-01-13 LAB — CBC WITH DIFFERENTIAL/PLATELET
BASOS ABS: 0.1 10*3/uL (ref 0.0–0.1)
Basophils Relative: 0.9 % (ref 0.0–3.0)
Eosinophils Absolute: 0.2 10*3/uL (ref 0.0–0.7)
Eosinophils Relative: 1.8 % (ref 0.0–5.0)
HCT: 45.7 % (ref 36.0–46.0)
Hemoglobin: 14.8 g/dL (ref 12.0–15.0)
LYMPHS ABS: 1.8 10*3/uL (ref 0.7–4.0)
LYMPHS PCT: 14 % (ref 12.0–46.0)
MCHC: 32.4 g/dL (ref 30.0–36.0)
MCV: 86.1 fl (ref 78.0–100.0)
MONOS PCT: 5 % (ref 3.0–12.0)
Monocytes Absolute: 0.6 10*3/uL (ref 0.1–1.0)
NEUTROS PCT: 78.3 % — AB (ref 43.0–77.0)
Neutro Abs: 9.8 10*3/uL — ABNORMAL HIGH (ref 1.4–7.7)
Platelets: 214 10*3/uL (ref 150.0–400.0)
RBC: 5.3 Mil/uL — AB (ref 3.87–5.11)
RDW: 15.8 % — ABNORMAL HIGH (ref 11.5–15.5)
WBC: 12.6 10*3/uL — AB (ref 4.0–10.5)

## 2017-01-13 LAB — COMPREHENSIVE METABOLIC PANEL
ALBUMIN: 3.7 g/dL (ref 3.5–5.2)
ALT: 42 U/L — AB (ref 0–35)
AST: 32 U/L (ref 0–37)
Alkaline Phosphatase: 116 U/L (ref 39–117)
BILIRUBIN TOTAL: 0.4 mg/dL (ref 0.2–1.2)
BUN: 15 mg/dL (ref 6–23)
CALCIUM: 8.9 mg/dL (ref 8.4–10.5)
CO2: 27 mEq/L (ref 19–32)
Chloride: 100 mEq/L (ref 96–112)
Creatinine, Ser: 0.8 mg/dL (ref 0.40–1.20)
GFR: 77.85 mL/min (ref >60.00)
GLUCOSE: 76 mg/dL (ref 70–99)
Potassium: 3.8 mEq/L (ref 3.5–5.1)
Sodium: 138 mEq/L (ref 135–145)
TOTAL PROTEIN: 7.6 g/dL (ref 6.0–8.3)

## 2017-01-13 NOTE — Telephone Encounter (Signed)
New message    Pt c/o medication issue:  1. Name of Medication: apixaban (ELIQUIS) 5 MG TABS tablet and water pill per pt  2. How are you currently taking this medication (dosage and times per day)? 5mg   3. Are you having a reaction (difficulty breathing--STAT)? falling  4. What is your medication issue? Pt had a few falls recently and pt wants to know if these medications have any contributing factor to it

## 2017-01-13 NOTE — Telephone Encounter (Signed)
Patient called and request that her chart be reviewed due to falls. She states that she was told by the nursing home and at discharge from hospital, that her fall are due to Eliquis and Lasix.

## 2017-01-14 ENCOUNTER — Other Ambulatory Visit: Payer: Self-pay | Admitting: Family

## 2017-01-14 DIAGNOSIS — M1991 Primary osteoarthritis, unspecified site: Secondary | ICD-10-CM | POA: Diagnosis not present

## 2017-01-14 DIAGNOSIS — I10 Essential (primary) hypertension: Secondary | ICD-10-CM | POA: Diagnosis not present

## 2017-01-14 DIAGNOSIS — I4891 Unspecified atrial fibrillation: Secondary | ICD-10-CM | POA: Diagnosis not present

## 2017-01-14 NOTE — Telephone Encounter (Signed)
Would arrange paov to review risk and benefit of anticoagulation Kirk Ruths

## 2017-01-14 NOTE — Telephone Encounter (Signed)
lm2cb-schedule PAOV to discuss lasix vs plavix

## 2017-01-14 NOTE — Telephone Encounter (Signed)
Patient scheduled 01-29-17

## 2017-01-15 ENCOUNTER — Telehealth: Payer: Self-pay | Admitting: Family

## 2017-01-15 DIAGNOSIS — M1991 Primary osteoarthritis, unspecified site: Secondary | ICD-10-CM | POA: Diagnosis not present

## 2017-01-15 DIAGNOSIS — I10 Essential (primary) hypertension: Secondary | ICD-10-CM | POA: Diagnosis not present

## 2017-01-15 DIAGNOSIS — I4891 Unspecified atrial fibrillation: Secondary | ICD-10-CM | POA: Diagnosis not present

## 2017-01-15 LAB — VITAMIN D 1,25 DIHYDROXY
VITAMIN D 1, 25 (OH) TOTAL: 29 pg/mL (ref 18–72)
Vitamin D2 1, 25 (OH)2: <8 pg/mL
Vitamin D3 1, 25 (OH)2: 29 pg/mL

## 2017-01-15 NOTE — Telephone Encounter (Signed)
°  Relation to ZU:AUEB Call back [facility], [place] - [address] [phone] (Phone) [phone] (Fax)    Reason for call:  Patient requesting a refill diltiazem (CARDIZEM CD) 240 MG 24 hr capsule

## 2017-01-16 ENCOUNTER — Telehealth: Payer: Self-pay | Admitting: Family

## 2017-01-16 DIAGNOSIS — M1991 Primary osteoarthritis, unspecified site: Secondary | ICD-10-CM | POA: Diagnosis not present

## 2017-01-16 DIAGNOSIS — I10 Essential (primary) hypertension: Secondary | ICD-10-CM | POA: Diagnosis not present

## 2017-01-16 DIAGNOSIS — I4891 Unspecified atrial fibrillation: Secondary | ICD-10-CM | POA: Diagnosis not present

## 2017-01-16 NOTE — Telephone Encounter (Signed)
Caller name: [name]  Relation to pt: OT from [facility]  Call back number: [phone]    Reason for call:  OT would like to discuss medication mentioned below, OT not sure if medication was d/c, please advise methocarbamol (ROBAXIN) 500 MG tablet  Magnesium 250 MG TABS  valsartan (DIOVAN) 80 MG tablet or losartan (does not reflect patient chart)

## 2017-01-18 ENCOUNTER — Other Ambulatory Visit: Payer: Self-pay | Admitting: Family Medicine

## 2017-01-18 DIAGNOSIS — I10 Essential (primary) hypertension: Secondary | ICD-10-CM | POA: Diagnosis not present

## 2017-01-18 DIAGNOSIS — I4891 Unspecified atrial fibrillation: Secondary | ICD-10-CM | POA: Diagnosis not present

## 2017-01-18 DIAGNOSIS — M1991 Primary osteoarthritis, unspecified site: Secondary | ICD-10-CM | POA: Diagnosis not present

## 2017-01-18 MED ORDER — DILTIAZEM HCL ER COATED BEADS 240 MG PO CP24: 240.0000 mg | ORAL_CAPSULE | Freq: Every day | ORAL | 1 refills | Status: DC

## 2017-01-18 NOTE — Progress Notes (Signed)
On call physician received call from [facility] OT re: patient needing Diltiazem Rx sent to local pharmacy. Stated that she was out as of today. BP 138/82, resting. HR WNL per OT.  Also stated that patient is not taking Losartan, though it is on her medication list. I okayed to hold until contacted by PCP.   [facility] OT wanted to leave cell number: [phone].  Briscoe Deutscher, D.O. [place], [facility]

## 2017-01-18 NOTE — Progress Notes (Signed)
Please call pt and confirm that she is not taking valsartan. Hospital d/c summary states she was on at time of discharge. If she is not taking ok to remain off of valsartan.   BP Readings from Last 3 Encounters:  01/12/17 124/82  12/12/16 (!) 151/77  12/08/16 139/75

## 2017-01-19 ENCOUNTER — Telehealth: Payer: Self-pay | Admitting: Family

## 2017-01-19 ENCOUNTER — Telehealth: Payer: Self-pay | Admitting: *Deleted

## 2017-01-19 DIAGNOSIS — M1991 Primary osteoarthritis, unspecified site: Secondary | ICD-10-CM | POA: Diagnosis not present

## 2017-01-19 DIAGNOSIS — I10 Essential (primary) hypertension: Secondary | ICD-10-CM | POA: Diagnosis not present

## 2017-01-19 DIAGNOSIS — I4891 Unspecified atrial fibrillation: Secondary | ICD-10-CM | POA: Diagnosis not present

## 2017-01-19 NOTE — Telephone Encounter (Signed)
Message sent to [facility]

## 2017-01-19 NOTE — Telephone Encounter (Signed)
I don't see documentation of UTI during her hospitalization so I would no recommend that diagnosis be used.

## 2017-01-19 NOTE — Telephone Encounter (Signed)
[name] or [name]-- Can you fax home health referral to [facility]?

## 2017-01-19 NOTE — Telephone Encounter (Addendum)
Left message for [name] to return my call. Had spoken with pt earlier from previous message from PCP dated 01/18/17 (orders only w/Erica Juleen China, DO) that pt could remain off Valsartan if she had not been taking it and pt had stated that she had not taken since she was discharged from hospital.

## 2017-01-19 NOTE — Progress Notes (Signed)
Verified with pt that she has not taken Valsartan since hospital discharge. Advised ok to remain off of it per note below and pt voices understanding.

## 2017-01-19 NOTE — Telephone Encounter (Signed)
Sree -PT with [facility] Sprung [phone]   He said that pt said that she was Dx with a UTI when she was in the hospital but he doesn't see that Dx in chart from hospital. He would like to be advised on if he can use Dx UTI or not?

## 2017-01-19 NOTE — Telephone Encounter (Signed)
She should not be on robaxin, magnesium.  If she has been taking valsartan, then we should d/c and change to losartan 50mg  once daily (valsartan was recalled). Repeat bp in 1 month. Otherwise- ok to remain off of valsartan if she has not been taking.  BP Readings from Last 3 Encounters:  01/12/17 124/82  12/12/16 (!) 151/77  12/08/16 139/75

## 2017-01-19 NOTE — Telephone Encounter (Signed)
Received completed paperwork from provider that was dropped off by patient at 01/12/17 OV, there is no address and/or fax number to return paperwork to on the two sheets given. Spoke to provider this morning, Mon, 01/19/17 about this and she states to call patient and find out if she has the fax number and/or if it is going to be picked-up, as provider stated pt is not very mobile/SLS 08/27  Please see My Chart message regarding phone conversation with patient, who was a little reluctant to do the work of getting contact information for her employers [employer] group that handles their disability, and pt actually hung up on me when I asked her to get this needed information, so I in turn, sent My Chart message/SLS 08/27

## 2017-01-19 NOTE — Telephone Encounter (Signed)
Notified Sree and he voices understanding.

## 2017-01-19 NOTE — Telephone Encounter (Signed)
Melissa-- please advise / confirm that pt is supposed to be on the meds below. If magnesium and methocarbamol are to be continued can you tell the directions for both?  Valsartan is still on med list, is pt to continue that as well?

## 2017-01-19 NOTE — Telephone Encounter (Signed)
Per 01/18/17 on call note, rx sent.

## 2017-01-19 NOTE — Telephone Encounter (Signed)
[facility] nurse called to let provider know that patient is being cared for by [facility] so her orders will need to go to them

## 2017-01-19 NOTE — Addendum Note (Signed)
Addended by: Kelle Darting A on: 01/19/2017 11:03 AM   Modules accepted: Orders

## 2017-01-19 NOTE — Telephone Encounter (Signed)
Patient called back fax number is [phone]

## 2017-01-20 ENCOUNTER — Encounter: Payer: Self-pay | Admitting: *Deleted

## 2017-01-20 NOTE — Telephone Encounter (Signed)
Notified pt of change to Losartan and rx has been sent. Does the 1 month follow up need to be with you? Can home health check and call us with report?

## 2017-01-20 NOTE — Telephone Encounter (Signed)
Letter mailed to patient and recall extended  for 6 months

## 2017-01-20 NOTE — Telephone Encounter (Signed)
Need nurse visit because she will need bmet as well please.

## 2017-01-20 NOTE — Telephone Encounter (Signed)
Left message for [name] to return my call.

## 2017-01-20 NOTE — Telephone Encounter (Signed)
Don returned my call and was notified of below. Will notify pt of BP med change.

## 2017-01-20 NOTE — Telephone Encounter (Signed)
Letter sent to you for review Thanks

## 2017-01-20 NOTE — Telephone Encounter (Signed)
Paperwork has been faxed with confirmation; pt informed via My Chart message/SLS 08/28

## 2017-01-21 ENCOUNTER — Other Ambulatory Visit: Payer: Self-pay | Admitting: Family

## 2017-01-21 DIAGNOSIS — M1991 Primary osteoarthritis, unspecified site: Secondary | ICD-10-CM | POA: Diagnosis not present

## 2017-01-21 DIAGNOSIS — I4891 Unspecified atrial fibrillation: Secondary | ICD-10-CM | POA: Diagnosis not present

## 2017-01-21 DIAGNOSIS — I10 Essential (primary) hypertension: Secondary | ICD-10-CM | POA: Diagnosis not present

## 2017-01-22 DIAGNOSIS — I4891 Unspecified atrial fibrillation: Secondary | ICD-10-CM | POA: Diagnosis not present

## 2017-01-22 DIAGNOSIS — M1991 Primary osteoarthritis, unspecified site: Secondary | ICD-10-CM | POA: Diagnosis not present

## 2017-01-22 DIAGNOSIS — I10 Essential (primary) hypertension: Secondary | ICD-10-CM | POA: Diagnosis not present

## 2017-01-23 ENCOUNTER — Encounter: Payer: Self-pay | Admitting: Family

## 2017-01-23 DIAGNOSIS — I10 Essential (primary) hypertension: Secondary | ICD-10-CM

## 2017-01-23 DIAGNOSIS — M1991 Primary osteoarthritis, unspecified site: Secondary | ICD-10-CM | POA: Diagnosis not present

## 2017-01-23 DIAGNOSIS — I4891 Unspecified atrial fibrillation: Secondary | ICD-10-CM | POA: Diagnosis not present

## 2017-01-23 MED ORDER — LOSARTAN POTASSIUM 50 MG PO TABS: 50.0000 mg | ORAL_TABLET | Freq: Every day | ORAL | 0 refills | Status: DC

## 2017-01-23 NOTE — Telephone Encounter (Signed)
Levocetirizine request from pharmacy. Med not currently on med list. Please advise?

## 2017-01-27 ENCOUNTER — Encounter: Payer: Self-pay | Admitting: Family

## 2017-01-27 ENCOUNTER — Telehealth: Payer: Self-pay | Admitting: *Deleted

## 2017-01-27 ENCOUNTER — Ambulatory Visit (INDEPENDENT_AMBULATORY_CARE_PROVIDER_SITE_OTHER): Payer: 59 | Admitting: Family

## 2017-01-27 VITALS — BP 120/66 | HR 90 | Temp 98.6°F | Ht 64.0 in | Wt 293.0 lb

## 2017-01-27 DIAGNOSIS — L97519 Non-pressure chronic ulcer of other part of right foot with unspecified severity: Secondary | ICD-10-CM

## 2017-01-27 DIAGNOSIS — Z23 Encounter for immunization: Secondary | ICD-10-CM | POA: Diagnosis not present

## 2017-01-27 DIAGNOSIS — L304 Erythema intertrigo: Secondary | ICD-10-CM

## 2017-01-27 NOTE — Telephone Encounter (Signed)
Received Home Health Certification and Plan of Care; forwarded to provider/SLS 09/04  

## 2017-01-27 NOTE — Patient Instructions (Addendum)
Please continue nystatin powder twice daily to affected areas.

## 2017-01-27 NOTE — Telephone Encounter (Signed)
Received Physician Orders [x3] from [facility]; forwarded to provider/SLS 09/04

## 2017-01-27 NOTE — Progress Notes (Signed)
Subjective:    Patient ID: [name], female    DOB: [birth year], 61 y.o.   MRN: [id number]  HPI    [name] is a 61 yr old female who presents today for follow pu.   1) Rash- reports that intertrigo rash is improving.  2) toe ulcer- reports that this is improving.     Review of Systems     Past Medical History:  Diagnosis Date  . Abnormal Pap smear    years ago/no biopsy  . Allergic rhinitis   . Anemia, iron deficiency   . Arthritis    back- lower  . Asthma   . Atrial fibrillation ([place]) August 2012   OFF XARELTO LAST MONTH DUE TO BLEEDING IN STOOL  . Bursitis of hip left  . Dysrhythmia    afib, followed by Dr. Stanford Breed   . Edema of both legs   . Fatty liver 01/07/2012  . Fibroid 1974   fibroid cyst on left fallopian tube  . Fibromyalgia   . Foot ulcer ([place])    AREA HEALED RIGHT FOOT  . GERD (gastroesophageal reflux disease)   . Headache(784.0)    occasional sinus headache   . History of blood transfusion JULY 2015  . Hypertension   . Hypoparathyroidism ([place]) 01/07/2011  . Hypothyroidism   . Kidney stone 22-Oct-2015   passed on their own  . Morbid obesity ([place])   . Nephrolithiasis 2/16, 9/16   SEES DR Risa Grill  . Pernicious anemia 02/20/2014   followed by Debbrah Alar  . PONV (postoperative nausea and vomiting)   . Seizures ([place])    infancy secondary to fever  . Thyroid cancer ([place]) 2011   THYROIDECTOMY DONE  . Uterine cancer ([place]) 2014   Mirena IUD  . Vitamin D deficiency      Social History   Social History  . Marital status: Single    Spouse name: N/A  . Number of children: 0  . Years of education: N/A   Occupational History  . works in Insurance claims handler   . 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 Smoking status: Former Smoker    Packs/day: 0.50    Years: 25.00    Types: Cigarettes    Start date: 12/24/1970    Quit date: 05/27/1995  . Smokeless tobacco: Never Used     Comment: quit smoking 19 years  ago  . Alcohol use 0.0 oz/week     Comment: 1/2 glass per month  . Drug use: No  . Sexual activity: Yes    Partners: Male    Birth control/ protection: Post-menopausal   Other Topics Concern  . Not on file   Social History Narrative   Occupation: works in Insurance claims handler - Field seismologist   Single       Former Smoker - quit tobacco 12 years ago.  She was light smoker for 10 years.                 Past Surgical History:  Procedure Laterality Date  . AMPUTATION Right 05/18/2014   Procedure: RIGHT FIFTH RAY AMPUTATION FOOT;  Surgeon: Wylene Simmer, MD;  Location: [place];  Service: Orthopedics;  Laterality: Right;  . APPENDECTOMY    . BUNIONECTOMY     bilateral  . COLONOSCOPY WITH PROPOFOL N/A 02/02/2014   Procedure: COLONOSCOPY WITH PROPOFOL;  Surgeon: Irene Shipper, MD;  Location: [facility];  Service: Endoscopy;  Laterality: N/A;  . cyst on  ovary removed     . CYSTOSCOPY W/ RETROGRADES  03/03/2012   Procedure: CYSTOSCOPY WITH RETROGRADE PYELOGRAM;  Surgeon: Bernestine Amass, MD;  Location: [facility];  Service: Urology;  Laterality: Bilateral;  . CYSTOSCOPY WITH RETROGRADE PYELOGRAM, URETEROSCOPY AND STENT PLACEMENT Left 08/25/2012   Procedure: CYSTOSCOPY WITH RETROGRADE PYELOGRAM, URETEROSCOPY;  Surgeon: Bernestine Amass, MD;  Location: [facility];  Service: Urology;  Laterality: Left;  . DILATION AND CURETTAGE OF UTERUS N/A 02/21/2013   Procedure: DILATATION AND CURETTAGE;  Surgeon: Lyman Speller, MD;  Location: [facility];  Service: Gynecology;  Laterality: N/A;  . DILATION AND CURETTAGE OF UTERUS N/A 04/09/2015   Procedure: [place] IUD removal;  Surgeon: Megan Salon, MD;  Location: [facility];  Service: Gynecology;  Laterality: N/A;  Patient weight 307lbs  . ESOPHAGOGASTRODUODENOSCOPY (EGD) WITH PROPOFOL N/A 02/02/2014   Procedure: ESOPHAGOGASTRODUODENOSCOPY (EGD) WITH PROPOFOL;  Surgeon: Irene Shipper, MD;  [facility];  Service: Endoscopy;  Laterality:  N/A;  . GASTRIC BYPASS  1974  . HOLMIUM LASER APPLICATION Left 06/03/6220   Procedure: HOLMIUM LASER APPLICATION;  Surgeon: Bernestine Amass, MD;  Location: [facility];  Service: Urology;  Laterality: Left;  . LITHOTRIPSY  03/2012  . LITHOTRIPSY  2/16  . THYROIDECTOMY  05/15/2010  . TONSILLECTOMY AND ADENOIDECTOMY    . URETEROSCOPY  03/03/2012   Procedure: URETEROSCOPY;  Surgeon: Bernestine Amass, MD;  Location: [facility];  Service: Urology;  Laterality: Left;    Family History  Problem Relation Age of Onset  . Hypertension Father   . Diabetes Father   . Lung cancer Father   . Heart attack Father        MI at age 60  . Hypertension Mother   . Hyperthyroidism Mother   . Heart disease Mother   . Heart attack Mother        MI at age 51  . Asthma Brother   . Hypertension Brother        younger  . Heart disease Brother        older  . Colon cancer Neg Hx   . Esophageal cancer Neg Hx   . Stomach cancer Neg Hx   . Kidney disease Neg Hx   . Liver disease Neg Hx     Allergies  Allergen Reactions  . Other Anaphylaxis    NUTS  . Amoxicillin-Pot Clavulanate Other (See Comments)    headache  . Food Hives    Potato  . Penicillins Other (See Comments)    Headache. Has patient had a PCN reaction causing immediate rash, facial/tongue/throat swelling, SOB or lightheadedness with hypotension: No Has patient had a PCN reaction causing severe rash involving mucus membranes or skin necrosis: No Has patient had a PCN reaction that required hospitalization: No Has patient had a PCN reaction occurring within the last 10 years: Yes If all of the above answers are "NO", then may proceed with Cephalosporin use.   . Tomato Hives    Current Outpatient Prescriptions on File Prior to Visit  Medication Sig Dispense Refill  . acetaminophen (TYLENOL) 325 MG tablet Take 2 tablets (650 mg total) by mouth every 6 (six) hours as needed for mild pain or moderate pain. 60 tablet 0  . albuterol (PROVENTIL  HFA;VENTOLIN HFA) 108 (90 Base) MCG/ACT inhaler Inhale 2 puffs into the lungs every 6 (six) hours as needed for wheezing or shortness of breath. 1 Inhaler 5  . apixaban (ELIQUIS) 5 MG TABS tablet Take  1 tablet (5 mg total) by mouth daily.    . calcitRIOL (ROCALTROL) 0.5 MCG capsule Take 1.5 mcg by mouth daily.     . Cyanocobalamin (VITAMIN B-12 IJ) Inject as directed every 30 (thirty) days.    . diclofenac sodium (VOLTAREN) 1 % GEL Apply topically 2 (two) times daily as needed.    . fluticasone (FLONASE) 50 MCG/ACT nasal spray Place 2 sprays into both nostrils daily as needed for allergies or rhinitis.     . furosemide (LASIX) 40 MG tablet TAKE 1 TABLET BY MOUTH  DAILY (Patient taking differently: TAKE 40MG  BY MOUTH  DAILY) 90 tablet 1  . levocetirizine (XYZAL) 5 MG tablet TAKE 1 TABLET BY MOUTH  EVERY EVENING 90 tablet 3  . levothyroxine (SYNTHROID, LEVOTHROID) 100 MCG tablet Take 300 mcg by mouth daily.     [name] neomycin-bacitracin-polymyxin (NEOSPORIN) ointment Apply 1 application topically as needed for wound care. apply to eye    . nystatin (MYCOSTATIN/NYSTOP) powder Apply topically 2 (two) times daily. 57.7 g 1  . omeprazole (PRILOSEC) 40 MG capsule TAKE 1 CAPSULE BY MOUTH  DAILY (Patient taking differently: TAKE 40MG  BY MOUTH  DAILY) 90 capsule 1  . SYMBICORT 160-4.5 MCG/ACT inhaler USE 2 PUFFS TWO TIMES DAILY 30.6 g 0  . traMADol (ULTRAM) 50 MG tablet Take 2 tablets (100 mg total) by mouth every 8 (eight) hours as needed for moderate pain. 30 tablet 0  . Vitamin D, Ergocalciferol, (DRISDOL) 50000 units CAPS capsule TAKE 1 CAPSULE BY MOUTH 3  TIMES WEEKLY (Patient taking differently: TAKE 1 CAPSULE BY MOUTH 4 TIMES WEEKLY) 33 capsule 5  . zafirlukast (ACCOLATE) 20 MG tablet TAKE 1 TABLET BY MOUTH 2  TIMES DAILY BEFORE MEALS 180 tablet 0  . calcium carbonate (TUMS - DOSED IN MG ELEMENTAL CALCIUM) 500 MG chewable tablet Chew 1 tablet (200 mg of elemental calcium total) by mouth 2 (two) times daily  with a meal. (Patient not taking: Reported on 01/27/2017)    . losartan (COZAAR) 50 MG tablet Take 1 tablet (50 mg total) by mouth daily. (Patient not taking: Reported on 01/27/2017) 30 tablet 0   No current facility-administered medications on file prior to visit.     BP 120/66   Pulse 90   Temp 98.6 F (37 C) (Oral)   Ht 5\' 4"  (1.626 m)   Wt 293 lb (132.9 kg)   LMP 06/11/2012   SpO2 98%   BMI 50.29 kg/m    Objective:   Physical Exam  Constitutional: She is oriented to person, place, and time. She appears well-developed and well-nourished.  Cardiovascular: Normal rate, regular rhythm and normal heart sounds.   No murmur heard. Pulmonary/Chest: Effort normal and breath sounds normal. No respiratory distress. She has no wheezes.  Musculoskeletal: She exhibits no edema.  Neurological: She is alert and oriented to person, place, and time.  Psychiatric: She has a normal mood and affect. Her behavior is normal. Judgment and thought content normal.              Assessment & Plan:  Toe ulcer- improving. Advised pt to continue to monitor. Call if increased pain/rednes/drainage or if skin becomes open in this area.  Intertrigo- improving.  Advised pt to continue topical nystatin.

## 2017-01-28 ENCOUNTER — Telehealth: Payer: Self-pay | Admitting: Family

## 2017-01-28 ENCOUNTER — Encounter: Payer: Self-pay | Admitting: Family

## 2017-01-28 DIAGNOSIS — I4891 Unspecified atrial fibrillation: Secondary | ICD-10-CM | POA: Diagnosis not present

## 2017-01-28 DIAGNOSIS — M1991 Primary osteoarthritis, unspecified site: Secondary | ICD-10-CM | POA: Diagnosis not present

## 2017-01-28 DIAGNOSIS — I10 Essential (primary) hypertension: Secondary | ICD-10-CM | POA: Diagnosis not present

## 2017-01-28 NOTE — Telephone Encounter (Signed)
Caller name: [name]  Relation to pt: OT from [facility]  Call back number: [phone]  Pharmacy:  Reason for call:  Patient OT visit cancelled due to schedule conflict, OT requesting verbal orders to try again,please advise and leave detail message

## 2017-01-28 NOTE — Telephone Encounter (Signed)
Left detailed nessage on below voicemail and to call if any further orders or concerns.

## 2017-01-29 ENCOUNTER — Encounter: Payer: Self-pay | Admitting: Family

## 2017-01-29 ENCOUNTER — Telehealth: Payer: Self-pay | Admitting: *Deleted

## 2017-01-29 ENCOUNTER — Encounter: Payer: Self-pay | Admitting: Physician Assistant

## 2017-01-29 ENCOUNTER — Ambulatory Visit (INDEPENDENT_AMBULATORY_CARE_PROVIDER_SITE_OTHER): Payer: 59 | Admitting: Physician Assistant

## 2017-01-29 ENCOUNTER — Other Ambulatory Visit: Payer: Self-pay | Admitting: *Deleted

## 2017-01-29 VITALS — BP 110/70 | HR 84 | Ht 64.0 in | Wt 298.0 lb

## 2017-01-29 DIAGNOSIS — I4891 Unspecified atrial fibrillation: Secondary | ICD-10-CM | POA: Diagnosis not present

## 2017-01-29 DIAGNOSIS — Z79899 Other long term (current) drug therapy: Secondary | ICD-10-CM

## 2017-01-29 DIAGNOSIS — I480 Paroxysmal atrial fibrillation: Secondary | ICD-10-CM

## 2017-01-29 DIAGNOSIS — I10 Essential (primary) hypertension: Secondary | ICD-10-CM | POA: Diagnosis not present

## 2017-01-29 DIAGNOSIS — E039 Hypothyroidism, unspecified: Secondary | ICD-10-CM

## 2017-01-29 DIAGNOSIS — M1991 Primary osteoarthritis, unspecified site: Secondary | ICD-10-CM | POA: Diagnosis not present

## 2017-01-29 MED ORDER — DESOXIMETASONE 0.05 % EX CREA: TOPICAL_CREAM | Freq: Two times a day (BID) | CUTANEOUS | 0 refills | Status: DC | PRN

## 2017-01-29 MED ORDER — DILTIAZEM HCL ER COATED BEADS 240 MG PO CP24: 240.0000 mg | ORAL_CAPSULE | Freq: Every day | ORAL | Status: DC

## 2017-01-29 NOTE — Progress Notes (Signed)
Cardiology Office Note    Date:  01/31/2017   ID:  [name], DOB 1955/09/10, MRN [id number]  PCP:  Debbrah Alar, NP  Cardiologist:  Dr. Stanford Breed  Chief Complaint  Patient presents with  . Follow-up    Seen for Dr. Stanford Breed. pt fell three times in 4 days    History of Present Illness:  [name] is a 61 y.o. female with PMH of PAF, hypertension, hypoparathyroidism, morbid obesity and hypothyroidism. Echocardiogram in January 2012 showed normal LV function, mild LVH, mild LAE. Mildly in July 2012 showed EF 64%, mild ischemia in the inferior wall. This was treated medically. She has had her monitor placed for evaluation of palpitations and near syncope episodes, this revealed sinus rhythm with PACs, brief runs of PAT and PAF. MRA of in September 2014 showed no aneurysm. He is not taking Xarelto anymore due to GI bleed. Endoscopy performed on 02/02/2014 revealed distal esophagitis.  Her last office visit with Dr. Stanford Breed was in January 2018 , given lack of recurrent GI bleed, patient was started on eliquis instead. She was seen in the ED on 12/08/2016 after a mechanical fall when she tripped on the carpet. She returned to the ED on the following day 12/09/2016 with another fall after tripping on the carpet again. This time she was on the ground for 18 hours or so. She was unable to get off the floor. On arrival, total CK was 958. Potassium was low at 3.1. She was complaining of weakness for the past 8 month, TSH and free T4 was normal.  Patient presents today for cardiology office visit. Since the last fall that brought her to the ED, she has been doing physical therapy and has noticed significant improvement in her lower extremity strength. She is no longer wobbly and ambulate at least 80 yards without any issue. If she has more falling episodes, we may have to switch her from eliquis to aspirin, but it appears with physical therapy, her imbalance is getting better. She denies  any significant shortness breath. For the past 2 weeks, she has very atypical chest discomfort under her right breast. It only occurred about 3 times lasting half hour each. None of the 3 episodes of the right-sided chest discomfort occurred during exertion. She can still do physical therapy without any exertional symptoms. We obtained EKG today which did not show any ischemic changes. Otherwise we discussed several options including stress testing versus observation, I think it would be reasonable to observe this for now given how atypical he does. I will bring her back for a close outpatient follow-up. If she has worsening symptom, she has been instructed to contact us for outpatient stress testing. Otherwise on further discussion, it appears she is taking hydrochlorothiazide and diltiazem which are not listed on her medication list. It is unclear to me with her hydrochlorothiazide was removed from the record. As far as the diltiazem, I think it was removed accidentally 2 days ago. She is still taking all of them. Given the fact that she is on Lasix and has been having significant weakness recently, I will discontinue the hydrochlorothiazide. She will need a 2 week basic metabolic panel at her PCPs office. I will see her back in a month for close outpatient visit.   Past Medical History:  Diagnosis Date  . Abnormal Pap smear    years ago/no biopsy  . Allergic rhinitis   . Anemia, iron deficiency   . Arthritis    back-  lower  . Asthma   . Atrial fibrillation ([place]) August 2012   OFF XARELTO LAST MONTH DUE TO BLEEDING IN STOOL  . Bursitis of hip left  . Dysrhythmia    afib, followed by Dr. Stanford Breed   . Edema of both legs   . Fatty liver 01/07/2012  . Fibroid 1974   fibroid cyst on left fallopian tube  . Fibromyalgia   . Foot ulcer ([place])    AREA HEALED RIGHT FOOT  . GERD (gastroesophageal reflux disease)   . Headache(784.0)    occasional sinus headache   . History of blood transfusion JULY  2015  . Hypertension   . Hypoparathyroidism ([place]) 01/07/2011  . Hypothyroidism   . Kidney stone 10-15-2015   passed on their own  . Morbid obesity ([place])   . Nephrolithiasis 2/16, 9/16   SEES DR Risa Grill  . Pernicious anemia 02/20/2014   followed by Debbrah Alar  . PONV (postoperative nausea and vomiting)   . Seizures ([place])    infancy secondary to fever  . Thyroid cancer ([place]) 2011   THYROIDECTOMY DONE  . Uterine cancer ([place]) 2014   Mirena IUD  . Vitamin D deficiency     Past Surgical History:  Procedure Laterality Date  . AMPUTATION Right 05/18/2014   Procedure: RIGHT FIFTH RAY AMPUTATION FOOT;  Surgeon: Wylene Simmer, MD;  Location: [facility];  Service: Orthopedics;  Laterality: Right;  . APPENDECTOMY    . BUNIONECTOMY     bilateral  . COLONOSCOPY WITH PROPOFOL N/A 02/02/2014   Procedure: COLONOSCOPY WITH PROPOFOL;  Surgeon: Irene Shipper, MD;  Location: [facility];  Service: Endoscopy;  Laterality: N/A;  . cyst on ovary removed     . CYSTOSCOPY W/ RETROGRADES  03/03/2012   Procedure: CYSTOSCOPY WITH RETROGRADE PYELOGRAM;  Surgeon: Bernestine Amass, MD;  Location: [facility];  Service: Urology;  Laterality: Bilateral;  . CYSTOSCOPY WITH RETROGRADE PYELOGRAM, URETEROSCOPY AND STENT PLACEMENT Left 08/25/2012   Procedure: CYSTOSCOPY WITH RETROGRADE PYELOGRAM, URETEROSCOPY;  Surgeon: Bernestine Amass, MD;  Location: [facility];  Service: Urology;  Laterality: Left;  . DILATION AND CURETTAGE OF UTERUS N/A 02/21/2013   Procedure: DILATATION AND CURETTAGE;  Surgeon: Lyman Speller, MD;  Location: [facility];  Service: Gynecology;  Laterality: N/A;  . DILATION AND CURETTAGE OF UTERUS N/A 04/09/2015   Procedure: [place] IUD removal;  Surgeon: Megan Salon, MD;  Location: [facility];  Service: Gynecology;  Laterality: N/A;  Patient weight 307lbs  . ESOPHAGOGASTRODUODENOSCOPY (EGD) WITH PROPOFOL N/A 02/02/2014   Procedure: ESOPHAGOGASTRODUODENOSCOPY (EGD) WITH PROPOFOL;  Surgeon: Irene Shipper,  MD;  [facility];  Service: Endoscopy;  Laterality: N/A;  . GASTRIC BYPASS  1974  . HOLMIUM LASER APPLICATION Left 11/02/6787   Procedure: HOLMIUM LASER APPLICATION;  Surgeon: Bernestine Amass, MD;  Location: [facility];  Service: Urology;  Laterality: Left;  . LITHOTRIPSY  03/2012  . LITHOTRIPSY  2/16  . THYROIDECTOMY  05/15/2010  . TONSILLECTOMY AND ADENOIDECTOMY    . URETEROSCOPY  03/03/2012   Procedure: URETEROSCOPY;  Surgeon: Bernestine Amass, MD;  Location: [facility];  Service: Urology;  Laterality: Left;    Current Medications: Outpatient Medications Prior to Visit  Medication Sig Dispense Refill  . acetaminophen (TYLENOL) 325 MG tablet Take 2 tablets (650 mg total) by mouth every 6 (six) hours as needed for mild pain or moderate pain. 60 tablet 0  . albuterol (PROVENTIL HFA;VENTOLIN HFA) 108 (90 Base) MCG/ACT inhaler Inhale 2 puffs into  the lungs every 6 (six) hours as needed for wheezing or shortness of breath. 1 Inhaler 5  . apixaban (ELIQUIS) 5 MG TABS tablet Take 1 tablet (5 mg total) by mouth daily. (Patient taking differently: Take 2.5 mg by mouth 2 (two) times daily. )    . calcitRIOL (ROCALTROL) 0.5 MCG capsule Take 1.5 mcg by mouth daily.     . calcium carbonate (TUMS - DOSED IN MG ELEMENTAL CALCIUM) 500 MG chewable tablet Chew 1 tablet (200 mg of elemental calcium total) by mouth 2 (two) times daily with a meal.    . Cyanocobalamin (VITAMIN B-12 IJ) Inject as directed every 30 (thirty) days.    [name] desoximetasone (TOPICORT) 0.05 % cream Apply topically 2 (two) times daily as needed. 60 g 0  . diclofenac sodium (VOLTAREN) 1 % GEL Apply topically 2 (two) times daily as needed.    . fluticasone (FLONASE) 50 MCG/ACT nasal spray Place 2 sprays into both nostrils daily as needed for allergies or rhinitis.     . furosemide (LASIX) 40 MG tablet TAKE 1 TABLET BY MOUTH  DAILY (Patient taking differently: TAKE 40MG  BY MOUTH  DAILY) 90 tablet 1  . levocetirizine (XYZAL) 5 MG tablet TAKE 1  TABLET BY MOUTH  EVERY EVENING 90 tablet 3  . levothyroxine (SYNTHROID, LEVOTHROID) 100 MCG tablet Take 300 mcg by mouth daily.     [name] losartan (COZAAR) 50 MG tablet Take 1 tablet (50 mg total) by mouth daily. 30 tablet 0  . neomycin-bacitracin-polymyxin (NEOSPORIN) ointment Apply 1 application topically as needed for wound care. apply to eye    . nystatin (MYCOSTATIN/NYSTOP) powder Apply topically 2 (two) times daily. 57.7 g 1  . omeprazole (PRILOSEC) 40 MG capsule TAKE 1 CAPSULE BY MOUTH  DAILY (Patient taking differently: TAKE 40MG  BY MOUTH  DAILY) 90 capsule 1  . SYMBICORT 160-4.5 MCG/ACT inhaler USE 2 PUFFS TWO TIMES DAILY 30.6 g 0  . traMADol (ULTRAM) 50 MG tablet Take 2 tablets (100 mg total) by mouth every 8 (eight) hours as needed for moderate pain. 30 tablet 0  . Vitamin D, Ergocalciferol, (DRISDOL) 50000 units CAPS capsule TAKE 1 CAPSULE BY MOUTH 3  TIMES WEEKLY (Patient taking differently: TAKE 1 CAPSULE BY MOUTH 4 TIMES WEEKLY) 33 capsule 5  . zafirlukast (ACCOLATE) 20 MG tablet TAKE 1 TABLET BY MOUTH 2  TIMES DAILY BEFORE MEALS 180 tablet 0   No facility-administered medications prior to visit.      Allergies:   Other; Penicillins; Amoxicillin-pot clavulanate; Food; and Tomato   Social History   Social History  . Marital status: Single    Spouse name: N/A  . Number of children: 0  . Years of education: N/A   Occupational History  . works in Insurance claims handler   . 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 Smoking status: Former Smoker    Packs/day: 0.50    Years: 25.00    Types: Cigarettes    Start date: 12/24/1970    Quit date: 05/27/1995  . Smokeless tobacco: Never Used     Comment: quit smoking 19 years ago  . Alcohol use 0.0 oz/week     Comment: 1/2 glass per month  . Drug use: No  . Sexual activity: Yes    Partners: Male    Birth control/ protection: Post-menopausal   Other Topics Concern  . None   Social History Narrative    Occupation: works in Armed forces operational officer  Single       Former Smoker - quit tobacco 12 years ago.  She was light smoker for 10 years.                  Family History:  The patient's family history includes Asthma in her brother; Diabetes in her father; Heart attack in her father and mother; Heart disease in her brother and mother; Hypertension in her brother, father, and mother; Hyperthyroidism in her mother; Lung cancer in her father.   ROS:   Please see the history of present illness.    ROS All other systems reviewed and are negative.   PHYSICAL EXAM:   VS:  BP 110/70   Pulse 84   Ht 5\' 4"  (1.626 m)   Wt 298 lb (135.2 kg)   LMP 06/11/2012   BMI 51.15 kg/m    GEN: Well nourished, well developed, in no acute distress  HEENT: normal  Neck: no JVD, carotid bruits, or masses Cardiac: RRR; no murmurs, rubs, or gallops,no edema  Respiratory:  clear to auscultation bilaterally, normal work of breathing GI: soft, nontender, nondistended, + BS MS: no deformity or atrophy  Skin: warm and dry, no rash Neuro:  Alert and Oriented x 3, Strength and sensation are intact Psych: euthymic mood, full affect  Wt Readings from Last 3 Encounters:  01/29/17 298 lb (135.2 kg)  01/27/17 293 lb (132.9 kg)  01/12/17 295 lb 9.6 oz (134.1 kg)      Studies/Labs Reviewed:   EKG:  EKG is ordered today.  The ekg ordered today demonstrates Normal sinus rhythm, poor R wave progression in anterior leads  Recent Labs: 12/09/2016: TSH 1.394 12/10/2016: Magnesium 2.1 01/12/2017: ALT 42; BUN 15; Creatinine, Ser 0.80; Hemoglobin 14.8; Platelets 214.0; Potassium 3.8; Sodium 138   Lipid Panel    Component Value Date/Time   CHOL 146 03/19/2016 1030   TRIG 98.0 03/19/2016 1030   HDL 49.30 03/19/2016 1030   CHOLHDL 3 03/19/2016 1030   VLDL 19.6 03/19/2016 1030   LDLCALC 77 03/19/2016 1030    Additional studies/ records that were reviewed today include:   Cardiac event monitor  12/28/2010 brief episodes of PAT and PAF   ASSESSMENT:    1. Paroxysmal atrial fibrillation (HCC)   2. Encounter for long-term (current) use of medications   3. Essential hypertension   4. Hypothyroidism, unspecified type   5. Morbid obesity ([place])      PLAN:  In order of problems listed above:  1. PAF: On eliquis since January. So far, she has fallen multiple times recently due to imbalance issue. Since starting on physical therapy, she is getting stronger and she is no longer feeling imbalanced. I will continue on eliquis for now, however if she continued to have recurrent fall, we may have to switch her from eliquis to aspirin. CHA2DS2-Vasc score 2 (HTN, female)  2. Hypertension: It turned out she is on HCTZ and diltiazem that is not listed on our record. Diltiazem was discontinued 2 days ago, however there was no clear explanation why diltiazem was discontinued. Given recurrent fall, I will discontinue her HCTZ as she is also on Lasix. She will need a basic metabolic panel to assess her electrolyte  3. Hypothyroidism: On Synthroid    Medication Adjustments/Labs and Tests Ordered: Current medicines are reviewed at length with the patient today.  Concerns regarding medicines are outlined above.  Medication changes, Labs and Tests ordered today are listed in the Patient Instructions below. Patient Instructions  Medication Instructions:   STOP hydrochlorothiazide (HCTZ).  Make sure you are taking Diltiazem 240mg  DAILY.  Make sure all other medications are taken as listed.   Labwork:  BMET in [place] (here or at primary care office) We are sending you home with lab slips. You do not need to fast for this test.   Testing/Procedures:  None  Follow-Up:  In 1 month with Dr. Stanford Breed or with Almyra Deforest PA on a day Dr. Stanford Breed is in the office.   If you need a refill on your cardiac medications before your next appointment, please call your pharmacy.      Hilbert Corrigan, [place]  01/31/2017 1:21 PM    [facility] [address], [place], [place]  [address] Phone: [phone]; Fax: [phone]

## 2017-01-29 NOTE — Patient Instructions (Signed)
Medication Instructions:   STOP hydrochlorothiazide (HCTZ).  Make sure you are taking Diltiazem 240mg  DAILY.  Make sure all other medications are taken as listed.   Labwork:  BMET in [place] (here or at primary care office) We are sending you home with lab slips. You do not need to fast for this test.   Testing/Procedures:  None  Follow-Up:  In 1 month with Dr. Stanford Breed or with Almyra Deforest PA on a day Dr. Stanford Breed is in the office.   If you need a refill on your cardiac medications before your next appointment, please call your pharmacy.

## 2017-01-29 NOTE — Telephone Encounter (Signed)
Received request for complete Office Notes from 11/23/16 to present to complete STD claim; OV notes faxed/SLS 09/06

## 2017-01-30 DIAGNOSIS — I4891 Unspecified atrial fibrillation: Secondary | ICD-10-CM | POA: Diagnosis not present

## 2017-01-30 DIAGNOSIS — I10 Essential (primary) hypertension: Secondary | ICD-10-CM | POA: Diagnosis not present

## 2017-01-30 DIAGNOSIS — M1991 Primary osteoarthritis, unspecified site: Secondary | ICD-10-CM | POA: Diagnosis not present

## 2017-01-31 ENCOUNTER — Encounter: Payer: Self-pay | Admitting: Physician Assistant

## 2017-02-01 ENCOUNTER — Other Ambulatory Visit: Payer: Self-pay | Admitting: Family

## 2017-02-02 ENCOUNTER — Telehealth: Payer: Self-pay | Admitting: *Deleted

## 2017-02-02 ENCOUNTER — Encounter: Payer: Self-pay | Admitting: Family

## 2017-02-02 ENCOUNTER — Encounter: Payer: Self-pay | Admitting: *Deleted

## 2017-02-02 NOTE — Telephone Encounter (Signed)
Received Letter from provider clarifying dates patient is to be out of work [until 02/17/17] and per provider order, she would like patient to schedule F/U visit prior to that date to assess her readiness to return to work at that time [see 01/29/17 My Chart message]. Called and spoke with patient RE: provider instructions and patient understood and agreed; patient has appt scheduled for Monday, 02/16/17 at 8:45am. Will obtain paperwork from [facility] for patient and fax with letter/SLS 09/10

## 2017-02-02 NOTE — Telephone Encounter (Signed)
Letter faxed to [employer]/SLS 09/10

## 2017-02-02 NOTE — Telephone Encounter (Signed)
Received Physician Orders from [facility]; forwarded to provider/SLS 09/10

## 2017-02-04 DIAGNOSIS — M1991 Primary osteoarthritis, unspecified site: Secondary | ICD-10-CM | POA: Diagnosis not present

## 2017-02-04 DIAGNOSIS — I4891 Unspecified atrial fibrillation: Secondary | ICD-10-CM | POA: Diagnosis not present

## 2017-02-04 DIAGNOSIS — I10 Essential (primary) hypertension: Secondary | ICD-10-CM | POA: Diagnosis not present

## 2017-02-09 ENCOUNTER — Other Ambulatory Visit: Payer: Self-pay | Admitting: Family

## 2017-02-09 ENCOUNTER — Encounter: Payer: Self-pay | Admitting: Family

## 2017-02-09 NOTE — Telephone Encounter (Signed)
Note being reviewed by PCP if to approve/thx dmf

## 2017-02-09 NOTE — Telephone Encounter (Signed)
Rogue Jury at [facility]. Call her with questions do not call pt per her request. Manuela Schwartz states Nursing has been discharged for two weeks. She plans to discharge physical therapy today. Per susan putting powder in folds of skin is not a skilled need. They have come once a week and used powder at that visit and taught pt how to put powder on her folds of skin. Applying powder is not a skilled need. They recommended to pt she go see PCP if she feels it needs medical skilled attention.

## 2017-02-09 NOTE — Telephone Encounter (Signed)
Notified pt and scheduled appt for 02/11/17 at 9:45am. Pt was scheduled for nurse visit on 02/12/17 for BP check / labs and b12 injection. Advised pt we will do at office visit on 02/11/17.

## 2017-02-09 NOTE — Telephone Encounter (Signed)
Please advise pt:  The RN states that powder application does not count as a skilled need so they will not be able to come out to the your house to apply powder without another skilled need. I would recommend that she been re-evaluated in our office to see if the area has become infected due to worsening pain.

## 2017-02-10 ENCOUNTER — Encounter: Payer: Self-pay | Admitting: Family

## 2017-02-10 ENCOUNTER — Ambulatory Visit (INDEPENDENT_AMBULATORY_CARE_PROVIDER_SITE_OTHER): Payer: 59 | Admitting: Family

## 2017-02-10 VITALS — BP 120/54 | HR 74 | Temp 98.2°F | Resp 16 | Ht 64.0 in | Wt 298.0 lb

## 2017-02-10 DIAGNOSIS — E538 Deficiency of other specified B group vitamins: Secondary | ICD-10-CM | POA: Diagnosis not present

## 2017-02-10 DIAGNOSIS — L304 Erythema intertrigo: Secondary | ICD-10-CM | POA: Diagnosis not present

## 2017-02-10 DIAGNOSIS — I10 Essential (primary) hypertension: Secondary | ICD-10-CM

## 2017-02-10 LAB — BASIC METABOLIC PANEL
BUN: 22 mg/dL (ref 6–23)
CHLORIDE: 101 meq/L (ref 96–112)
CO2: 26 meq/L (ref 19–32)
CREATININE: 0.7 mg/dL (ref 0.40–1.20)
Calcium: 8.1 mg/dL — ABNORMAL LOW (ref 8.4–10.5)
GFR: 90.8 mL/min (ref >60.00)
Glucose, Bld: 90 mg/dL (ref 70–99)
POTASSIUM: 3.1 meq/L — AB (ref 3.5–5.1)
SODIUM: 137 meq/L (ref 135–145)

## 2017-02-10 MED ORDER — CYANOCOBALAMIN 1000 MCG/ML IJ SOLN
1000.0000 ug | Freq: Once | INTRAMUSCULAR | Status: AC
  Administered 2017-02-10: 1000 ug via INTRAMUSCULAR

## 2017-02-10 MED ORDER — FLUCONAZOLE 100 MG PO TABS: 100.0000 mg | ORAL_TABLET | ORAL | 0 refills | Status: DC

## 2017-02-10 MED ORDER — CEPHALEXIN 500 MG PO CAPS: 500.0000 mg | ORAL_CAPSULE | Freq: Three times a day (TID) | ORAL | 0 refills | Status: DC

## 2017-02-10 MED ORDER — NYSTATIN 100000 UNIT/GM EX POWD: CUTANEOUS | 5 refills | Status: DC

## 2017-02-10 NOTE — Addendum Note (Signed)
Addended by: Caffie Pinto on: 02/10/2017 10:59 AM   Modules accepted: Orders

## 2017-02-10 NOTE — Progress Notes (Signed)
Subjective:    Patient ID: [name], female    DOB: 1956/01/16, 61 y.o.   MRN: [id number]  HPI  [name] is a 61 yr old female who presents today for follow up.  Intertrigo- notes worsening rash between thighs and beneath abdominal skin fold despite use of nystatin. Area has become sore.   HTN-mainained on losartan 50mg .   BP Readings from Last 3 Encounters:  02/10/17 (!) 120/54  01/29/17 110/70  01/27/17 120/66   b12 deficiency- maintained on monthly b12 injections.   Review of Systems See HPI  Past Medical History:  Diagnosis Date  . Abnormal Pap smear    years ago/no biopsy  . Allergic rhinitis   . Anemia, iron deficiency   . Arthritis    back- lower  . Asthma   . Atrial fibrillation ([place]) August 2012   OFF XARELTO LAST MONTH DUE TO BLEEDING IN STOOL  . Bursitis of hip left  . Dysrhythmia    afib, followed by Dr. Stanford Breed   . Edema of both legs   . Fatty liver 01/07/2012  . Fibroid 1974   fibroid cyst on left fallopian tube  . Fibromyalgia   . Foot ulcer ([place])    AREA HEALED RIGHT FOOT  . GERD (gastroesophageal reflux disease)   . Headache(784.0)    occasional sinus headache   . History of blood transfusion JULY 2015  . Hypertension   . Hypoparathyroidism ([place]) 01/07/2011  . Hypothyroidism   . Kidney stone 2015/10/10   passed on their own  . Morbid obesity ([place])   . Nephrolithiasis 2/16, 9/16   SEES DR Risa Grill  . Pernicious anemia 02/20/2014   followed by Debbrah Alar  . PONV (postoperative nausea and vomiting)   . Seizures ([place])    infancy secondary to fever  . Thyroid cancer ([place]) 2011   THYROIDECTOMY DONE  . Uterine cancer ([place]) 2014   Mirena IUD  . Vitamin D deficiency      Social History   Social History  . Marital status: Single    Spouse name: N/A  . Number of children: 0  . Years of education: N/A   Occupational History  . works in Insurance claims handler   . DES[employer]ER [employer]   Social History Main Topics    . Smoking status: Former Smoker    Packs/day: 0.50    Years: 25.00    Types: Cigarettes    Start date: 12/24/1970    Quit date: 05/27/1995  . Smokeless tobacco: Never Used     Comment: quit smoking 19 years ago  . Alcohol use 0.0 oz/week     Comment: 1/2 glass per month  . Drug use: No  . Sexual activity: Yes    Partners: Male    Birth control/ protection: Post-menopausal   Other Topics Concern  . Not on file   Social History Narrative   Occupation: works in Insurance claims handler - Field seismologist   Single       Former Smoker - quit tobacco 12 years ago.  She was light smoker for 10 years.                 Past Surgical History:  Procedure Laterality Date  . AMPUTATION Right 05/18/2014   Procedure: RIGHT FIFTH RAY AMPUTATION FOOT;  Surgeon: Wylene Simmer, MD;  Location: [facility];  Service: Orthopedics;  Laterality: Right;  . APPENDECTOMY    . BUNIONECTOMY     bilateral  . COLONOSCOPY  WITH PROPOFOL N/A 02/02/2014   Procedure: COLONOSCOPY WITH PROPOFOL;  Surgeon: Irene Shipper, MD;  [facility];  Service: Endoscopy;  Laterality: N/A;  . cyst on ovary removed     . CYSTOSCOPY W/ RETROGRADES  03/03/2012   Procedure: CYSTOSCOPY WITH RETROGRADE PYELOGRAM;  Surgeon: Bernestine Amass, MD;  [facility];  Service: Urology;  Laterality: Bilateral;  . CYSTOSCOPY WITH RETROGRADE PYELOGRAM, URETEROSCOPY AND STENT PLACEMENT Left 08/25/2012   Procedure: CYSTOSCOPY WITH RETROGRADE PYELOGRAM, URETEROSCOPY;  Surgeon: Bernestine Amass, MD;  [facility];  Service: Urology;  Laterality: Left;  . DILATION AND CURETTAGE OF UTERUS N/A 02/21/2013   Procedure: DILATATION AND CURETTAGE;  Surgeon: Lyman Speller, MD;  Location: [facility];  Service: Gynecology;  Laterality: N/A;  . DILATION AND CURETTAGE OF UTERUS N/A 04/09/2015   Procedure: [place] IUD removal;  Surgeon: Megan Salon, MD;  Location: [facility];  Service: Gynecology;  Laterality: N/A;  Patient weight 307lbs  .  ESOPHAGOGASTRODUODENOSCOPY (EGD) WITH PROPOFOL N/A 02/02/2014   Procedure: ESOPHAGOGASTRODUODENOSCOPY (EGD) WITH PROPOFOL;  Surgeon: Irene Shipper, MD;  [facility];  Service: Endoscopy;  Laterality: N/A;  . GASTRIC BYPASS  1974  . HOLMIUM LASER APPLICATION Left 12/25/4233   Procedure: HOLMIUM LASER APPLICATION;  Surgeon: Bernestine Amass, MD;  [facility];  Service: Urology;  Laterality: Left;  . LITHOTRIPSY  03/2012  . LITHOTRIPSY  2/16  . THYROIDECTOMY  05/15/2010  . TONSILLECTOMY AND ADENOIDECTOMY    . URETEROSCOPY  03/03/2012   Procedure: URETEROSCOPY;  Surgeon: Bernestine Amass, MD;  [facility];  Service: Urology;  Laterality: Left;    Family History  Problem Relation Age of Onset  . Hypertension Father   . Diabetes Father   . Lung cancer Father   . Heart attack Father        MI at age 38  . Hypertension Mother   . Hyperthyroidism Mother   . Heart disease Mother   . Heart attack Mother        MI at age 59  . Asthma Brother   . Hypertension Brother        younger  . Heart disease Brother        older  . Colon cancer Neg Hx   . Esophageal cancer Neg Hx   . Stomach cancer Neg Hx   . Kidney disease Neg Hx   . Liver disease Neg Hx     Allergies  Allergen Reactions  . Other Anaphylaxis    NUTS  . Penicillins Other (See Comments)    Headache. Has patient had a PCN reaction causing immediate rash, facial/tongue/throat swelling, SOB or lightheadedness with hypotension: No Has patient had a PCN reaction causing severe rash involving mucus membranes or skin necrosis: No Has patient had a PCN reaction that required hospitalization: No Has patient had a PCN reaction occurring within the last 10 years: Yes If all of the above answers are "NO", then may proceed with Cephalosporin use.   [name] Amoxicillin-Pot Clavulanate Other (See Comments)    headache  . Food Hives    Potato  . Tomato Hives    Current Outpatient Prescriptions on File Prior to Visit  Medication  Sig Dispense Refill  . acetaminophen (TYLENOL) 325 MG tablet Take 2 tablets (650 mg total) by mouth every 6 (six) hours as needed for mild pain or moderate pain. 60 tablet 0  . albuterol (PROVENTIL HFA;VENTOLIN HFA) 108 (90 Base) MCG/ACT inhaler  Inhale 2 puffs into the lungs every 6 (six) hours as needed for wheezing or shortness of breath. 1 Inhaler 5  . apixaban (ELIQUIS) 5 MG TABS tablet Take 1 tablet (5 mg total) by mouth daily. (Patient taking differently: Take 5 mg by mouth 2 (two) times daily. )    . calcitRIOL (ROCALTROL) 0.5 MCG capsule Take 1.5 mcg by mouth daily.     . calcium carbonate (TUMS - DOSED IN MG ELEMENTAL CALCIUM) 500 MG chewable tablet Chew 1 tablet (200 mg of elemental calcium total) by mouth 2 (two) times daily with a meal.    . Cyanocobalamin (VITAMIN B-12 IJ) Inject as directed every 30 (thirty) days.    [name] desoximetasone (TOPICORT) 0.05 % cream Apply topically 2 (two) times daily as needed. 60 g 0  . diclofenac sodium (VOLTAREN) 1 % GEL Apply topically 2 (two) times daily as needed.    . diltiazem (CARDIZEM CD) 240 MG 24 hr capsule Take 1 capsule (240 mg total) by mouth daily.    . fluticasone (FLONASE) 50 MCG/ACT nasal spray Place 2 sprays into both nostrils daily as needed for allergies or rhinitis.     . furosemide (LASIX) 40 MG tablet TAKE 1 TABLET BY MOUTH  DAILY (Patient taking differently: TAKE 40MG  BY MOUTH  DAILY) 90 tablet 1  . levocetirizine (XYZAL) 5 MG tablet TAKE 1 TABLET BY MOUTH  EVERY EVENING 90 tablet 3  . levothyroxine (SYNTHROID, LEVOTHROID) 100 MCG tablet Take 300 mcg by mouth daily.     [name] losartan (COZAAR) 50 MG tablet Take 1 tablet (50 mg total) by mouth daily. 30 tablet 0  . neomycin-bacitracin-polymyxin (NEOSPORIN) ointment Apply 1 application topically as needed for wound care. apply to eye    . NYSTATIN powder APPLY TOPICALLY TO THE AFFECTED AREA TWICE DAILY 60 g 0  . omeprazole (PRILOSEC) 40 MG capsule TAKE 1 CAPSULE BY MOUTH  DAILY 90 capsule 1   . SYMBICORT 160-4.5 MCG/ACT inhaler USE 2 PUFFS TWO TIMES DAILY 30.6 g 0  . traMADol (ULTRAM) 50 MG tablet Take 2 tablets (100 mg total) by mouth every 8 (eight) hours as needed for moderate pain. 30 tablet 0  . Vitamin D, Ergocalciferol, (DRISDOL) 50000 units CAPS capsule TAKE 1 CAPSULE BY MOUTH 3  TIMES WEEKLY (Patient taking differently: TAKE 1 CAPSULE BY MOUTH 4 TIMES WEEKLY) 33 capsule 5  . zafirlukast (ACCOLATE) 20 MG tablet TAKE 1 TABLET BY MOUTH 2  TIMES DAILY BEFORE MEALS 180 tablet 0   No current facility-administered medications on file prior to visit.     BP (!) 120/54 (BP Location: Right Arm, Cuff Size: Large)   Pulse 74   Temp 98.2 F (36.8 C) (Oral)   Resp 16   Ht 5\' 4"  (1.626 m)   Wt 298 lb (135.2 kg)   LMP 06/11/2012   SpO2 97%   BMI 51.15 kg/m       Objective:   Physical Exam  Constitutional: She appears well-developed and well-nourished.  Cardiovascular: Normal rate, regular rhythm and normal heart sounds.   No murmur heard. Pulmonary/Chest: Effort normal and breath sounds normal. No respiratory distress. She has no wheezes.  Skin: Skin is warm and dry.  + erythematous rash noted between thighs, right side weeping slightly with some mild induration + mild erythema noted beneath breasts and abdominal pannus  Psychiatric: She has a normal mood and affect. Her behavior is normal. Judgment and thought content normal.  Assessment & Plan:  Intertrigo- appears to have some early cellulitis developing on the right inner thigh. Pt advised as follows:   Begin diflucan 100mg  once weekly for 4 weeks. Apply nystatin powder twice daily. Begin keflex. Follow up as scheduled in 1 week.  HTN- bp stable. Continue current meds. Obtain follow up bmet.  B12 deficiency- b12 injection given today.

## 2017-02-10 NOTE — Patient Instructions (Addendum)
Please complete lab work prior to leaving.  Begin diflucan 100mg  once weekly for 4 weeks. Apply nystatin powder twice daily. Begin keflex. Follow up as scheduled.

## 2017-02-11 ENCOUNTER — Ambulatory Visit: Payer: Self-pay | Admitting: Family

## 2017-02-11 ENCOUNTER — Telehealth: Payer: Self-pay | Admitting: Family

## 2017-02-11 DIAGNOSIS — I10 Essential (primary) hypertension: Secondary | ICD-10-CM | POA: Diagnosis not present

## 2017-02-11 DIAGNOSIS — I4891 Unspecified atrial fibrillation: Secondary | ICD-10-CM | POA: Diagnosis not present

## 2017-02-11 DIAGNOSIS — M1991 Primary osteoarthritis, unspecified site: Secondary | ICD-10-CM | POA: Diagnosis not present

## 2017-02-11 MED ORDER — POTASSIUM CHLORIDE CRYS ER 20 MEQ PO TBCR: 20.0000 meq | EXTENDED_RELEASE_TABLET | Freq: Every day | ORAL | 3 refills | Status: DC

## 2017-02-11 NOTE — Telephone Encounter (Signed)
Please let patient know that her potassium is low. I would like her to begin K-Dur 20 meq tabs. Take 2 tabs by mouth today, then one tablet once daily. Today however I would like her to take 2 tabs. She can start 1 tab daily tomorrow. We will plan to repeat her basic metabolic panel when she follows up in 1 week

## 2017-02-11 NOTE — Telephone Encounter (Signed)
Notified pt and she voices understanding. 

## 2017-02-12 ENCOUNTER — Encounter: Payer: Self-pay | Admitting: Family

## 2017-02-12 ENCOUNTER — Ambulatory Visit: Payer: 59

## 2017-02-16 ENCOUNTER — Encounter: Payer: Self-pay | Admitting: Family

## 2017-02-16 ENCOUNTER — Other Ambulatory Visit: Payer: Self-pay | Admitting: Family

## 2017-02-16 ENCOUNTER — Ambulatory Visit (INDEPENDENT_AMBULATORY_CARE_PROVIDER_SITE_OTHER): Payer: 59 | Admitting: Family

## 2017-02-16 VITALS — BP 100/68 | HR 79 | Temp 98.4°F | Resp 16 | Ht 64.0 in | Wt 294.6 lb

## 2017-02-16 DIAGNOSIS — L304 Erythema intertrigo: Secondary | ICD-10-CM

## 2017-02-16 DIAGNOSIS — R5381 Other malaise: Secondary | ICD-10-CM

## 2017-02-16 DIAGNOSIS — D72829 Elevated white blood cell count, unspecified: Secondary | ICD-10-CM | POA: Diagnosis not present

## 2017-02-16 DIAGNOSIS — E876 Hypokalemia: Secondary | ICD-10-CM | POA: Diagnosis not present

## 2017-02-16 LAB — BASIC METABOLIC PANEL
BUN: 13 mg/dL (ref 6–23)
CO2: 27 mEq/L (ref 19–32)
Calcium: 8.5 mg/dL (ref 8.4–10.5)
Chloride: 102 mEq/L (ref 96–112)
Creatinine, Ser: 0.82 mg/dL (ref 0.40–1.20)
GFR: 75.64 mL/min (ref >60.00)
Glucose, Bld: 92 mg/dL (ref 70–99)
POTASSIUM: 3.8 meq/L (ref 3.5–5.1)
SODIUM: 141 meq/L (ref 135–145)

## 2017-02-16 NOTE — Patient Instructions (Signed)
Please complete lab work prior to leaving. Contact your orthopedic doctor for Physical therapy referral.  You may return to work next week on Thursday.  Call if you have any further falls or new concerns.

## 2017-02-16 NOTE — Progress Notes (Signed)
Subjective:    Patient ID: [name], female    DOB: 09-Sep-1955, 61 y.o.   MRN: [id number]  HPI  [name] is a 61 year old female who presents today for follow-up. She was admitted July 17 through July 20 following recurrent falls. Her falls were felt to be due to multiple factors including polypharmacy, overdiuresis and/or deconditioning. She has continued physical therapy since her discharge.  She reports that she has been doing well. She was able to drive herself here today. She is walking with a rolling walker. She reports she had her final home health physical therapy session on Thursday. She continues to do her home exercises 3 times a day. She has had no further falls.  Intertrigo-reports that she continues treatment as recommended last visit. She is not sure that it is improving.  Review of Systems See HPI  Past Medical History:  Diagnosis Date  . Abnormal Pap smear    years ago/no biopsy  . Allergic rhinitis   . Anemia, iron deficiency   . Arthritis    back- lower  . Asthma   . Atrial fibrillation ([place]) August 2012   OFF XARELTO LAST MONTH DUE TO BLEEDING IN STOOL  . Bursitis of hip left  . Dysrhythmia    afib, followed by Dr. Stanford Breed   . Edema of both legs   . Fatty liver 01/07/2012  . Fibroid 1974   fibroid cyst on left fallopian tube  . Fibromyalgia   . Foot ulcer ([place])    AREA HEALED RIGHT FOOT  . GERD (gastroesophageal reflux disease)   . Headache(784.0)    occasional sinus headache   . History of blood transfusion JULY 2015  . Hypertension   . Hypoparathyroidism ([place]) 01/07/2011  . Hypothyroidism   . Kidney stone 10/14/15   passed on their own  . Morbid obesity ([place])   . Nephrolithiasis 2/16, 9/16   SEES DR Risa Grill  . Pernicious anemia 02/20/2014   followed by Debbrah Alar  . PONV (postoperative nausea and vomiting)   . Seizures ([place])    infancy secondary to fever  . Thyroid cancer ([place]) 2011   THYROIDECTOMY DONE  . Uterine cancer  ([place]) 2014   Mirena IUD  . Vitamin D deficiency      Social History   Social History  . Marital status: Single    Spouse name: N/A  . Number of children: 0  . Years of education: N/A   Occupational History  . works in Insurance claims handler   . 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 Smoking status: Former Smoker    Packs/day: 0.50    Years: 25.00    Types: Cigarettes    Start date: 12/24/1970    Quit date: 05/27/1995  . Smokeless tobacco: Never Used     Comment: quit smoking 19 years ago  . Alcohol use 0.0 oz/week     Comment: 1/2 glass per month  . Drug use: No  . Sexual activity: Yes    Partners: Male    Birth control/ protection: Post-menopausal   Other Topics Concern  . Not on file   Social History Narrative   Occupation: works in Insurance claims handler - Field seismologist   Single       Former Smoker - quit tobacco 12 years ago.  She was light smoker for 10 years.                 Past Surgical History:  Procedure Laterality Date  . AMPUTATION Right 05/18/2014   Procedure: RIGHT FIFTH RAY AMPUTATION FOOT;  Surgeon: Wylene Simmer, MD;  Location: [facility];  Service: Orthopedics;  Laterality: Right;  . APPENDECTOMY    . BUNIONECTOMY     bilateral  . COLONOSCOPY WITH PROPOFOL N/A 02/02/2014   Procedure: COLONOSCOPY WITH PROPOFOL;  Surgeon: Irene Shipper, MD;  [facility];  Service: Endoscopy;  Laterality: N/A;  . cyst on ovary removed     . CYSTOSCOPY W/ RETROGRADES  03/03/2012   Procedure: CYSTOSCOPY WITH RETROGRADE PYELOGRAM;  Surgeon: Bernestine Amass, MD;  [facility];  Service: Urology;  Laterality: Bilateral;  . CYSTOSCOPY WITH RETROGRADE PYELOGRAM, URETEROSCOPY AND STENT PLACEMENT Left 08/25/2012   Procedure: CYSTOSCOPY WITH RETROGRADE PYELOGRAM, URETEROSCOPY;  Surgeon: Bernestine Amass, MD;  [facility];  Service: Urology;  Laterality: Left;  . DILATION AND CURETTAGE OF UTERUS N/A 02/21/2013   Procedure: DILATATION AND  CURETTAGE;  Surgeon: Lyman Speller, MD;  Location: [facility];  Service: Gynecology;  Laterality: N/A;  . DILATION AND CURETTAGE OF UTERUS N/A 04/09/2015   Procedure: [place] IUD removal;  Surgeon: Megan Salon, MD;  Location: [facility];  Service: Gynecology;  Laterality: N/A;  Patient weight 307lbs  . ESOPHAGOGASTRODUODENOSCOPY (EGD) WITH PROPOFOL N/A 02/02/2014   Procedure: ESOPHAGOGASTRODUODENOSCOPY (EGD) WITH PROPOFOL;  Surgeon: Irene Shipper, MD;  [facility];  Service: Endoscopy;  Laterality: N/A;  . GASTRIC BYPASS  1974  . HOLMIUM LASER APPLICATION Left 0/06/7251   Procedure: HOLMIUM LASER APPLICATION;  Surgeon: Bernestine Amass, MD;  [facility];  Service: Urology;  Laterality: Left;  . LITHOTRIPSY  03/2012  . LITHOTRIPSY  2/16  . THYROIDECTOMY  05/15/2010  . TONSILLECTOMY AND ADENOIDECTOMY    . URETEROSCOPY  03/03/2012   Procedure: URETEROSCOPY;  Surgeon: Bernestine Amass, MD;  [facility];  Service: Urology;  Laterality: Left;    Family History  Problem Relation Age of Onset  . Hypertension Father   . Diabetes Father   . Lung cancer Father   . Heart attack Father        MI at age 7  . Hypertension Mother   . Hyperthyroidism Mother   . Heart disease Mother   . Heart attack Mother        MI at age 34  . Asthma Brother   . Hypertension Brother        younger  . Heart disease Brother        older  . Colon cancer Neg Hx   . Esophageal cancer Neg Hx   . Stomach cancer Neg Hx   . Kidney disease Neg Hx   . Liver disease Neg Hx     Allergies  Allergen Reactions  . Other Anaphylaxis    NUTS  . Penicillins Other (See Comments)    Headache. Has patient had a PCN reaction causing immediate rash, facial/tongue/throat swelling, SOB or lightheadedness with hypotension: No Has patient had a PCN reaction causing severe rash involving mucus membranes or skin necrosis: No Has patient had a PCN reaction that required hospitalization: No Has  patient had a PCN reaction occurring within the last 10 years: Yes If all of the above answers are "NO", then may proceed with Cephalosporin use.   [name] Amoxicillin-Pot Clavulanate Other (See Comments)    headache  . Food Hives    Potato  . Tomato Hives    Current Outpatient Prescriptions on File Prior to  Visit  Medication Sig Dispense Refill  . acetaminophen (TYLENOL) 325 MG tablet Take 2 tablets (650 mg total) by mouth every 6 (six) hours as needed for mild pain or moderate pain. 60 tablet 0  . albuterol (PROVENTIL HFA;VENTOLIN HFA) 108 (90 Base) MCG/ACT inhaler Inhale 2 puffs into the lungs every 6 (six) hours as needed for wheezing or shortness of breath. 1 Inhaler 5  . apixaban (ELIQUIS) 5 MG TABS tablet Take 1 tablet (5 mg total) by mouth daily. (Patient taking differently: Take 5 mg by mouth 2 (two) times daily. )    . calcitRIOL (ROCALTROL) 0.5 MCG capsule Take 1.5 mcg by mouth daily.     . calcium carbonate (TUMS - DOSED IN MG ELEMENTAL CALCIUM) 500 MG chewable tablet Chew 1 tablet (200 mg of elemental calcium total) by mouth 2 (two) times daily with a meal.    . cephALEXin (KEFLEX) 500 MG capsule Take 1 capsule (500 mg total) by mouth 3 (three) times daily. 21 capsule 0  . Cyanocobalamin (VITAMIN B-12 IJ) Inject as directed every 30 (thirty) days.    [name] desoximetasone (TOPICORT) 0.05 % cream Apply topically 2 (two) times daily as needed. 60 g 0  . diclofenac sodium (VOLTAREN) 1 % GEL Apply topically 2 (two) times daily as needed.    . diltiazem (CARDIZEM CD) 240 MG 24 hr capsule Take 1 capsule (240 mg total) by mouth daily.    . fluconazole (DIFLUCAN) 100 MG tablet Take 1 tablet (100 mg total) by mouth once a week. 4 tablet 0  . fluticasone (FLONASE) 50 MCG/ACT nasal spray Place 2 sprays into both nostrils daily as needed for allergies or rhinitis.     . furosemide (LASIX) 40 MG tablet TAKE 1 TABLET BY MOUTH  DAILY (Patient taking differently: TAKE 40MG  BY MOUTH  DAILY) 90 tablet 1  .  levocetirizine (XYZAL) 5 MG tablet TAKE 1 TABLET BY MOUTH  EVERY EVENING 90 tablet 3  . levothyroxine (SYNTHROID, LEVOTHROID) 100 MCG tablet Take 300 mcg by mouth daily.     [name] losartan (COZAAR) 50 MG tablet Take 1 tablet (50 mg total) by mouth daily. 30 tablet 0  . neomycin-bacitracin-polymyxin (NEOSPORIN) ointment Apply 1 application topically as needed for wound care. apply to eye    . nystatin (NYSTATIN) powder APPLY TOPICALLY TO THE AFFECTED AREA TWICE DAILY 60 g 5  . omeprazole (PRILOSEC) 40 MG capsule TAKE 1 CAPSULE BY MOUTH  DAILY 90 capsule 1  . potassium chloride SA (K-DUR,KLOR-CON) 20 MEQ tablet Take 1 tablet (20 mEq total) by mouth daily. 30 tablet 3  . SYMBICORT 160-4.5 MCG/ACT inhaler USE 2 PUFFS TWO TIMES DAILY 30.6 g 0  . traMADol (ULTRAM) 50 MG tablet Take 2 tablets (100 mg total) by mouth every 8 (eight) hours as needed for moderate pain. 30 tablet 0  . Vitamin D, Ergocalciferol, (DRISDOL) 50000 units CAPS capsule TAKE 1 CAPSULE BY MOUTH 3  TIMES WEEKLY (Patient taking differently: TAKE 1 CAPSULE BY MOUTH 4 TIMES WEEKLY) 33 capsule 5  . zafirlukast (ACCOLATE) 20 MG tablet TAKE 1 TABLET BY MOUTH 2  TIMES DAILY BEFORE MEALS 180 tablet 0   No current facility-administered medications on file prior to visit.     BP 100/68 (BP Location: Right Arm, Cuff Size: Large)   Pulse 79   Temp 98.4 F (36.9 C) (Oral)   Resp 16   Ht 5\' 4"  (1.626 m)   Wt 294 lb 9.6 oz (133.6 kg)   LMP 06/11/2012  SpO2 97%   BMI 50.57 kg/m       Objective:   Physical Exam  Constitutional: She appears well-developed and well-nourished.  Cardiovascular: Normal rate, regular rhythm and normal heart sounds.   No murmur heard. Pulmonary/Chest: Effort normal and breath sounds normal. No respiratory distress. She has no wheezes.  Psychiatric: She has a normal mood and affect. Her behavior is normal. Judgment and thought content normal.  Skin: Rash beneath abdominal skin fold is much improved. Near  resolution of rash between thighs.       Assessment & Plan:  Intertrigo-improved. Patient to continue weekly Diflucan topical nystatin and oral Keflex. We discussed keeping the area dry.  Deconditioning-she has completed her home physical therapy. We discussed continuing outpatient PT. I offered to make the referral for her. She declined she wishes to obtain this referral through her orthopedic doctor as she also wants him to address her shoulders. We discussed return to work we agreed on her returning to work next Thursday.  Hypokalemia-potassium was noted to be low on 918. Will repeat basic metabolic panel today.  Leukocytosis-likely due to her intertrigo/mild cellulitis. Will repeat CBC today to follow.

## 2017-02-17 ENCOUNTER — Other Ambulatory Visit: Payer: 59

## 2017-02-17 DIAGNOSIS — N200 Calculus of kidney: Secondary | ICD-10-CM | POA: Diagnosis not present

## 2017-02-17 LAB — CBC WITH DIFFERENTIAL/PLATELET
Basophils Absolute: 0.1 10*3/uL (ref 0.0–0.1)
Basophils Relative: 0.7 % (ref 0.0–3.0)
EOS PCT: 1.4 % (ref 0.0–5.0)
Eosinophils Absolute: 0.2 10*3/uL (ref 0.0–0.7)
HEMATOCRIT: 44.8 % (ref 36.0–46.0)
HEMOGLOBIN: 14.5 g/dL (ref 12.0–15.0)
LYMPHS PCT: 11.7 % — AB (ref 12.0–46.0)
Lymphs Abs: 1.2 10*3/uL (ref 0.7–4.0)
MCHC: 32.5 g/dL (ref 30.0–36.0)
MCV: 86.5 fl (ref 78.0–100.0)
MONO ABS: 0.5 10*3/uL (ref 0.1–1.0)
MONOS PCT: 4.9 % (ref 3.0–12.0)
Neutro Abs: 8.6 10*3/uL — ABNORMAL HIGH (ref 1.4–7.7)
Neutrophils Relative %: 81.3 % — ABNORMAL HIGH (ref 43.0–77.0)
Platelets: 246 10*3/uL (ref 150.0–400.0)
RBC: 5.17 Mil/uL — AB (ref 3.87–5.11)
RDW: 15.4 % (ref 11.5–15.5)
WBC: 10.6 10*3/uL — AB (ref 4.0–10.5)

## 2017-02-20 ENCOUNTER — Other Ambulatory Visit: Payer: 59

## 2017-02-23 ENCOUNTER — Telehealth: Payer: Self-pay | Admitting: *Deleted

## 2017-02-23 ENCOUNTER — Ambulatory Visit: Payer: 59 | Admitting: Physician Assistant

## 2017-02-23 MED ORDER — TRAMADOL HCL 50 MG PO TABS: 100.0000 mg | ORAL_TABLET | Freq: Three times a day (TID) | ORAL | 0 refills | Status: DC | PRN

## 2017-02-23 NOTE — Telephone Encounter (Signed)
Rx faxed to pharmacy  

## 2017-02-23 NOTE — Telephone Encounter (Signed)
Received refill request from [facility] for Tramadol 50mg .  Last Rx:  01/12/17, #30 Last OV: 02/16/17 Next OV: 05/25/17  UDS:  01/12/17 moderate. Due 04/14/17 Rx printed and forwarded to PCP for signature.

## 2017-02-24 ENCOUNTER — Ambulatory Visit (INDEPENDENT_AMBULATORY_CARE_PROVIDER_SITE_OTHER): Payer: 59 | Admitting: Physician Assistant

## 2017-02-24 ENCOUNTER — Ambulatory Visit: Payer: 59 | Admitting: Family

## 2017-02-24 ENCOUNTER — Encounter: Payer: Self-pay | Admitting: Family

## 2017-02-24 VITALS — BP 110/80 | HR 80 | Ht 64.0 in | Wt 297.0 lb

## 2017-02-24 DIAGNOSIS — I10 Essential (primary) hypertension: Secondary | ICD-10-CM

## 2017-02-24 DIAGNOSIS — E039 Hypothyroidism, unspecified: Secondary | ICD-10-CM | POA: Diagnosis not present

## 2017-02-24 DIAGNOSIS — I480 Paroxysmal atrial fibrillation: Secondary | ICD-10-CM

## 2017-02-24 NOTE — Progress Notes (Signed)
Cardiology Office Note    Date:  02/26/2017   ID:  [name], DOB November 11, 1955, MRN [id number]  PCP:  Jamie Alar, NP  Cardiologist:  Dr. Stanford Rogers   Chief Complaint  Patient presents with  . Follow-up    seen for Dr. Stanford Rogers    History of Present Illness:  [name] is a 61 y.o. female with PMH of PAF, hypertension, hypoparathyroidism, morbid obesity and hypothyroidism. Echocardiogram in January 2012 showed normal LV function, mild LVH, mild LAE. Myoview in July 2012 showed EF 64%, mild ischemia in the inferior wall. This was treated medically. She has had her monitor placed for evaluation of palpitations and near syncope episodes, this revealed sinus rhythm with PACs, brief runs of PAT and PAF. MRA in September 2014 showed no aneurysm. He is not taking Xarelto anymore due to GI bleed. Endoscopy performed on 02/02/2014 revealed distal esophagitis.  Her last office visit with Dr. Stanford Rogers was in January 2018 , given lack of recurrent GI bleed, patient was started on eliquis instead. She was seen in the ED on 12/08/2016 after a mechanical fall when she tripped on the carpet. She returned to the ED on the following day 12/09/2016 with another fall after tripping on the carpet again. This time she was on the ground for 18 hours or so. She was unable to get off the floor. On arrival, total CK was 958. Potassium was low at 3.1. She was complaining of weakness for the past 8 month, TSH and free T4 was normal.  Last time I saw the patient was on 01/29/2017, her imbalance was getting better with physical therapy. She had a very atypical chest discomfort under her right breast. It was not associated with exertion. After discussing various options including stress testing versus observation, we agreed to proceed with observation for the time being. Hydrochlorothiazide was discontinued during the last visit.  She presents today for cardiology office visit, since our last visit, she only  had one episode of chest pain and it was very short lived only lasting 30 seconds. She did not have any chest pain since. She has been continuing her physical therapy and feels stronger each day. She has not had any falling episode was any dizziness. No further workup is planned at this time, follow-up with Dr. Stanford Rogers in 3-4 months. She denies any lower extremity edema, orthopnea or PND.   Past Medical History:  Diagnosis Date  . Abnormal Pap smear    years ago/no biopsy  . Allergic rhinitis   . Anemia, iron deficiency   . Arthritis    back- lower  . Asthma   . Atrial fibrillation ([place]) August 2012   OFF XARELTO LAST MONTH DUE TO BLEEDING IN STOOL  . Bursitis of hip left  . Dysrhythmia    afib, followed by Dr. Stanford Rogers   . Edema of both legs   . Fatty liver 01/07/2012  . Fibroid 1974   fibroid cyst on left fallopian tube  . Fibromyalgia   . Foot ulcer ([place])    AREA HEALED RIGHT FOOT  . GERD (gastroesophageal reflux disease)   . Headache(784.0)    occasional sinus headache   . History of blood transfusion JULY 2015  . Hypertension   . Hypoparathyroidism ([place]) 01/07/2011  . Hypothyroidism   . Kidney stone 09/29/2015   passed on their own  . Morbid obesity ([place])   . Nephrolithiasis 2/16, 9/16   SEES DR [name]  . Pernicious anemia 02/20/2014  followed by [name]  . PONV (postoperative nausea and vomiting)   . Seizures ([place])    infancy secondary to fever  . Thyroid cancer ([place]) 2011   THYROIDECTOMY DONE  . Uterine cancer ([place]) 2014   Mirena IUD  . Vitamin D deficiency     Past Surgical History:  Procedure Laterality Date  . AMPUTATION Right 05/18/2014   Procedure: RIGHT FIFTH RAY AMPUTATION FOOT;  Surgeon: Wylene Simmer, MD;  Location: [facility];  Service: Orthopedics;  Laterality: Right;  . APPENDECTOMY    . BUNIONECTOMY     bilateral  . COLONOSCOPY WITH PROPOFOL N/A 02/02/2014   Procedure: COLONOSCOPY WITH PROPOFOL;  Surgeon: Irene Shipper, MD;  [facility];  Service: Endoscopy;  Laterality: N/A;  . cyst on ovary removed     . CYSTOSCOPY W/ RETROGRADES  03/03/2012   Procedure: CYSTOSCOPY WITH RETROGRADE PYELOGRAM;  Surgeon: Bernestine Amass, MD;  [facility];  Service: Urology;  Laterality: Bilateral;  . CYSTOSCOPY WITH RETROGRADE PYELOGRAM, URETEROSCOPY AND STENT PLACEMENT Left 08/25/2012   Procedure: CYSTOSCOPY WITH RETROGRADE PYELOGRAM, URETEROSCOPY;  Surgeon: Bernestine Amass, MD;  [facility];  Service: Urology;  Laterality: Left;  . DILATION AND CURETTAGE OF UTERUS N/A 02/21/2013   Procedure: DILATATION AND CURETTAGE;  Surgeon: Lyman Speller, MD;  Location: [facility];  Service: Gynecology;  Laterality: N/A;  . DILATION AND CURETTAGE OF UTERUS N/A 04/09/2015   Procedure: [place] IUD removal;  Surgeon: Megan Salon, MD;  Location: [facility];  Service: Gynecology;  Laterality: N/A;  Patient weight 307lbs  . ESOPHAGOGASTRODUODENOSCOPY (EGD) WITH PROPOFOL N/A 02/02/2014   Procedure: ESOPHAGOGASTRODUODENOSCOPY (EGD) WITH PROPOFOL;  Surgeon: Irene Shipper, MD;  [facility];  Service: Endoscopy;  Laterality: N/A;  . GASTRIC BYPASS  1974  . HOLMIUM LASER APPLICATION Left 01/26/2354   Procedure: HOLMIUM LASER APPLICATION;  Surgeon: Bernestine Amass, MD;  [facility];  Service: Urology;  Laterality: Left;  . LITHOTRIPSY  03/2012  . LITHOTRIPSY  2/16  . THYROIDECTOMY  05/15/2010  . TONSILLECTOMY AND ADENOIDECTOMY    . URETEROSCOPY  03/03/2012   Procedure: URETEROSCOPY;  Surgeon: Bernestine Amass, MD;  [facility];  Service: Urology;  Laterality: Left;    Current Medications: Outpatient Medications Prior to Visit  Medication Sig Dispense Refill  . acetaminophen (TYLENOL) 325 MG tablet Take 2 tablets (650 mg total) by mouth every 6 (six) hours as needed for mild pain or moderate pain. 60 tablet 0  . albuterol (PROVENTIL HFA;VENTOLIN HFA) 108 (90 Base) MCG/ACT inhaler Inhale 2 puffs into the lungs every 6  (six) hours as needed for wheezing or shortness of breath. 1 Inhaler 5  . apixaban (ELIQUIS) 5 MG TABS tablet Take 1 tablet (5 mg total) by mouth daily. (Patient taking differently: Take 5 mg by mouth 2 (two) times daily. )    . calcitRIOL (ROCALTROL) 0.5 MCG capsule Take 1.5 mcg by mouth daily.     . calcium carbonate (TUMS - DOSED IN MG ELEMENTAL CALCIUM) 500 MG chewable tablet Chew 1 tablet (200 mg of elemental calcium total) by mouth 2 (two) times daily with a meal.    . cephALEXin (KEFLEX) 500 MG capsule Take 1 capsule (500 mg total) by mouth 3 (three) times daily. 21 capsule 0  . Cyanocobalamin (VITAMIN B-12 IJ) Inject as directed every 30 (thirty) days.    [name] desoximetasone (TOPICORT) 0.05 % cream Apply topically 2 (two) times daily as needed. [place]  g 0  . diclofenac sodium (VOLTAREN) 1 % GEL Apply topically 2 (two) times daily as needed.    . diltiazem (CARDIZEM CD) 240 MG 24 hr capsule Take 1 capsule (240 mg total) by mouth daily.    . fluconazole (DIFLUCAN) 100 MG tablet Take 1 tablet (100 mg total) by mouth once a week. 4 tablet 0  . fluticasone (FLONASE) 50 MCG/ACT nasal spray Place 2 sprays into both nostrils daily as needed for allergies or rhinitis.     . furosemide (LASIX) 40 MG tablet TAKE 1 TABLET BY MOUTH  DAILY (Patient taking differently: TAKE 40MG  BY MOUTH  DAILY) 90 tablet 1  . levocetirizine (XYZAL) 5 MG tablet TAKE 1 TABLET BY MOUTH  EVERY EVENING 90 tablet 3  . levothyroxine (SYNTHROID, LEVOTHROID) 100 MCG tablet Take 300 mcg by mouth daily.     [name] losartan (COZAAR) 50 MG tablet TAKE 1 TABLET(50 MG) BY MOUTH DAILY 90 tablet 1  . neomycin-bacitracin-polymyxin (NEOSPORIN) ointment Apply 1 application topically as needed for wound care. apply to eye    . nystatin (NYSTATIN) powder APPLY TOPICALLY TO THE AFFECTED AREA TWICE DAILY 60 g 5  . omeprazole (PRILOSEC) 40 MG capsule TAKE 1 CAPSULE BY MOUTH  DAILY 90 capsule 1  . potassium chloride SA (K-DUR,KLOR-CON) 20 MEQ tablet Take 1  tablet (20 mEq total) by mouth daily. 30 tablet 3  . SYMBICORT 160-4.5 MCG/ACT inhaler USE 2 PUFFS TWO TIMES DAILY 30.6 g 0  . traMADol (ULTRAM) 50 MG tablet Take 2 tablets (100 mg total) by mouth every 8 (eight) hours as needed for moderate pain. 30 tablet 0  . Vitamin D, Ergocalciferol, (DRISDOL) 50000 units CAPS capsule TAKE 1 CAPSULE BY MOUTH 3  TIMES WEEKLY (Patient taking differently: TAKE 1 CAPSULE BY MOUTH 4 TIMES WEEKLY) 33 capsule 5  . zafirlukast (ACCOLATE) 20 MG tablet TAKE 1 TABLET BY MOUTH 2  TIMES DAILY BEFORE MEALS 180 tablet 0   No facility-administered medications prior to visit.      Allergies:   Other; Penicillins; Amoxicillin-pot clavulanate; Food; and Tomato   Social History   Social History  . Marital status: Single    Spouse name: N/A  . Number of children: 0  . Years of education: N/A   Occupational History  . works in Insurance claims handler   . 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 Smoking status: Former Smoker    Packs/day: 0.50    Years: 25.00    Types: Cigarettes    Start date: 12/24/1970    Quit date: 05/27/1995  . Smokeless tobacco: Never Used     Comment: quit smoking 19 years ago  . Alcohol use 0.0 oz/week     Comment: 1/2 glass per month  . Drug use: No  . Sexual activity: Yes    Partners: Male    Birth control/ protection: Post-menopausal   Other Topics Concern  . None   Social History Narrative   Occupation: works in Insurance claims handler - Field seismologist   Single       Former Smoker - quit tobacco 12 years ago.  She was light smoker for 10 years.                  Family History:  The patient's family history includes Asthma in her brother; Diabetes in her father; Heart attack in her father and mother; Heart disease in her brother and mother; Hypertension in her brother, father, and  mother; Hyperthyroidism in her mother; Lung cancer in her father.   ROS:   Please see the history of present illness.      ROS All other systems reviewed and are negative.   PHYSICAL EXAM:   VS:  BP 110/80   Pulse 80   Ht 5\' 4"  (1.626 m)   Wt 297 lb (134.7 kg)   LMP 06/11/2012   BMI 50.98 kg/m    GEN: Well nourished, well developed, in no acute distress  HEENT: normal  Neck: no JVD, carotid bruits, or masses Cardiac: RRR; no murmurs, rubs, or gallops,no edema  Respiratory:  clear to auscultation bilaterally, normal work of breathing GI: soft, nontender, nondistended, + BS MS: no deformity or atrophy  Skin: warm and dry, no rash Neuro:  Alert and Oriented x 3, Strength and sensation are intact Psych: euthymic mood, full affect  Wt Readings from Last 3 Encounters:  02/24/17 297 lb (134.7 kg)  02/16/17 294 lb 9.6 oz (133.6 kg)  02/10/17 298 lb (135.2 kg)      Studies/Labs Reviewed:   EKG:  EKG is not ordered today.  Recent Labs: 12/09/2016: TSH 1.394 12/10/2016: Magnesium 2.1 01/12/2017: ALT 42 02/16/2017: BUN 13; Creatinine, Ser 0.82; Hemoglobin 14.5; Platelets 246.0; Potassium 3.8; Sodium 141   Lipid Panel    Component Value Date/Time   CHOL 146 03/19/2016 1030   TRIG 98.0 03/19/2016 1030   HDL 49.30 03/19/2016 1030   CHOLHDL 3 03/19/2016 1030   VLDL 19.6 03/19/2016 1030   LDLCALC 77 03/19/2016 1030    Additional studies/ records that were reviewed today include:   CT Abdomen 12/10/2016 IMPRESSION: 1. Nonobstructing bilateral renal calculi. No hydronephrosis. Multiple small stones noted layering within the urinary bladder. No obstructing calculi identified. 2. Fatty liver. 3. No bowel obstruction or active inflammation.    ASSESSMENT:    1. PAF (paroxysmal atrial fibrillation) ([place])   2. Essential hypertension   3. Hypothyroidism, unspecified type      PLAN:  In order of problems listed above:  1. PAF on eliquis: Had multiple falls recently, however since starting therapy, she is getting stronger and and no longer have any falls. Will continue on eliquis for now.  CHA2DS2-Vasc score 2 (HTN, female)  2. Hypertension: Blood pressure well-controlled  3. Hypothyroidism: on Synthroid    Medication Adjustments/Labs and Tests Ordered: Current medicines are reviewed at length with the patient today.  Concerns regarding medicines are outlined above.  Medication changes, Labs and Tests ordered today are listed in the Patient Instructions below. Patient Instructions  Your physician wants you to follow-up in: 3-4 months with Dr. Stanford Rogers. You will receive a reminder letter in the mail two months in advance. If you don't receive a letter, please call our office to schedule the follow-up appointment.     Hilbert Corrigan, [place]  02/26/2017 1:21 PM    [facility] [place], [place], [place]  [address] Phone: [phone]; Fax: [phone]

## 2017-02-24 NOTE — Patient Instructions (Signed)
Your physician wants you to follow-up in: 3-4 months with Dr. Stanford Breed. You will receive a reminder letter in the mail two months in advance. If you don't receive a letter, please call our office to schedule the follow-up appointment.

## 2017-02-26 ENCOUNTER — Encounter: Payer: Self-pay | Admitting: Physician Assistant

## 2017-02-27 DIAGNOSIS — E892 Postprocedural hypoparathyroidism: Secondary | ICD-10-CM | POA: Diagnosis not present

## 2017-02-27 DIAGNOSIS — Z85850 Personal history of malignant neoplasm of thyroid: Secondary | ICD-10-CM | POA: Diagnosis not present

## 2017-02-27 DIAGNOSIS — N200 Calculus of kidney: Secondary | ICD-10-CM | POA: Diagnosis not present

## 2017-02-27 DIAGNOSIS — E039 Hypothyroidism, unspecified: Secondary | ICD-10-CM | POA: Diagnosis not present

## 2017-03-09 ENCOUNTER — Telehealth: Payer: Self-pay | Admitting: *Deleted

## 2017-03-09 MED ORDER — LOSARTAN POTASSIUM 50 MG PO TABS: ORAL_TABLET | ORAL | 1 refills | Status: DC

## 2017-03-09 NOTE — Telephone Encounter (Signed)
Received fax from [facility] requesting refill of losartan. Refill sent.

## 2017-03-18 ENCOUNTER — Ambulatory Visit (INDEPENDENT_AMBULATORY_CARE_PROVIDER_SITE_OTHER): Payer: 59

## 2017-03-18 DIAGNOSIS — E538 Deficiency of other specified B group vitamins: Secondary | ICD-10-CM

## 2017-03-18 MED ORDER — CYANOCOBALAMIN 1000 MCG/ML IJ SOLN
1000.0000 ug | Freq: Once | INTRAMUSCULAR | Status: AC
  Administered 2017-03-18: 1000 ug via INTRAMUSCULAR

## 2017-03-18 NOTE — Progress Notes (Signed)
Pre visit review using our clinic tool,if applicable. No additional management support is needed unless otherwise documented below in the visit note.   Patient in for B12 injection per order from Dr. Elvera Maria due to patient having B12 deficiency.  Patient has no complaints this visit.  Given 1000 mcg IM Right deltoid due to patient having B12 deficiency. Patient tolerated well.  Return appointment given for 1 month.

## 2017-03-20 ENCOUNTER — Encounter: Payer: Self-pay | Admitting: Family

## 2017-03-20 ENCOUNTER — Ambulatory Visit (HOSPITAL_BASED_OUTPATIENT_CLINIC_OR_DEPARTMENT_OTHER)
Admission: RE | Admit: 2017-03-20 | Discharge: 2017-03-20 | Disposition: A | Discharge status: Home / Self Care | Payer: 59 | Source: Ambulatory Visit | Attending: Family | Admitting: Family

## 2017-03-20 ENCOUNTER — Ambulatory Visit (INDEPENDENT_AMBULATORY_CARE_PROVIDER_SITE_OTHER): Payer: 59 | Admitting: Family

## 2017-03-20 VITALS — BP 130/81 | HR 80 | Temp 98.2°F | Ht 64.0 in | Wt 308.0 lb

## 2017-03-20 DIAGNOSIS — R918 Other nonspecific abnormal finding of lung field: Secondary | ICD-10-CM

## 2017-03-20 DIAGNOSIS — E8770 Fluid overload, unspecified: Secondary | ICD-10-CM | POA: Diagnosis not present

## 2017-03-20 DIAGNOSIS — R05 Cough: Secondary | ICD-10-CM | POA: Diagnosis not present

## 2017-03-20 DIAGNOSIS — R609 Edema, unspecified: Secondary | ICD-10-CM

## 2017-03-20 LAB — BRAIN NATRIURETIC PEPTIDE: PRO B NATRI PEPTIDE: 25 pg/mL (ref 0.0–100.0)

## 2017-03-20 LAB — COMPREHENSIVE METABOLIC PANEL
ALBUMIN: 3.5 g/dL (ref 3.5–5.2)
ALT: 15 U/L (ref 0–35)
AST: 14 U/L (ref 0–37)
Alkaline Phosphatase: 100 U/L (ref 39–117)
BUN: 14 mg/dL (ref 6–23)
CALCIUM: 7.8 mg/dL — AB (ref 8.4–10.5)
CO2: 32 mEq/L (ref 19–32)
Chloride: 99 mEq/L (ref 96–112)
Creatinine, Ser: 0.78 mg/dL (ref 0.40–1.20)
GFR: 80.11 mL/min (ref >60.00)
Glucose, Bld: 99 mg/dL (ref 70–99)
POTASSIUM: 3.8 meq/L (ref 3.5–5.1)
Sodium: 139 mEq/L (ref 135–145)
TOTAL PROTEIN: 7 g/dL (ref 6.0–8.3)
Total Bilirubin: 0.4 mg/dL (ref 0.2–1.2)

## 2017-03-20 NOTE — Patient Instructions (Addendum)
Please increase furosemide to twice daily for 3 days, then return to once daily.  Complete lab work prior to leaving.  Please increase your furosemide to twice daily for the next 3 days.  Take an additional dose of potassium citrate with your second dose of Lasix only.  Otherwise just do your potassium once a day. You will be contacted about your scheduling for echocardiogram. Go to the ER if you have worsening shortness of breath.

## 2017-03-20 NOTE — Progress Notes (Signed)
Subjective:    Patient ID: [name], female    DOB: 01-31-1956, 61 y.o.   MRN: [id number]  HPI  [name] is a 61 yr old female who presents today with chief complaint of bilateral LE edema. Started about 2 weeks ago. Denies LE pain.  Has some mild SOB in the evening but  "nothing major."  Continues lasix 40mg  once daily.    Wt Readings from Last 3 Encounters:  03/20/17 (!) 308 lb (139.7 kg)  02/24/17 297 lb (134.7 kg)  02/16/17 294 lb 9.6 oz (133.6 kg)     Review of Systems See HPI  Past Medical History:  Diagnosis Date  . Abnormal Pap smear    years ago/no biopsy  . Allergic rhinitis   . Anemia, iron deficiency   . Arthritis    back- lower  . Asthma   . Atrial fibrillation ([place]) August 2012   OFF XARELTO LAST MONTH DUE TO BLEEDING IN STOOL  . Bursitis of hip left  . Dysrhythmia    afib, followed by Dr. Stanford Breed   . Edema of both legs   . Fatty liver 01/07/2012  . Fibroid 1974   fibroid cyst on left fallopian tube  . Fibromyalgia   . Foot ulcer ([place])    AREA HEALED RIGHT FOOT  . GERD (gastroesophageal reflux disease)   . Headache(784.0)    occasional sinus headache   . History of blood transfusion JULY 2015  . Hypertension   . Hypoparathyroidism ([place]) 01/07/2011  . Hypothyroidism   . Kidney stone 10-02-15   passed on their own  . Morbid obesity ([place])   . Nephrolithiasis 2/16, 9/16   SEES DR Risa Grill  . Pernicious anemia 02/20/2014   followed by Debbrah Alar  . PONV (postoperative nausea and vomiting)   . Seizures ([place])    infancy secondary to fever  . Thyroid cancer ([place]) 2011   THYROIDECTOMY DONE  . Uterine cancer ([place]) 2014   Mirena IUD  . Vitamin D deficiency      Social History   Social History  . Marital status: Single    Spouse name: N/A  . Number of children: 0  . Years of education: N/A   Occupational History  . works in Insurance claims handler   . 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 Smoking  status: Former Smoker    Packs/day: 0.50    Years: 25.00    Types: Cigarettes    Start date: 12/24/1970    Quit date: 05/27/1995  . Smokeless tobacco: Never Used     Comment: quit smoking 19 years ago  . Alcohol use 0.0 oz/week     Comment: 1/2 glass per month  . Drug use: No  . Sexual activity: Yes    Partners: Male    Birth control/ protection: Post-menopausal   Other Topics Concern  . Not on file   Social History Narrative   Occupation: works in Insurance claims handler - Field seismologist   Single       Former Smoker - quit tobacco 12 years ago.  She was light smoker for 10 years.                 Past Surgical History:  Procedure Laterality Date  . AMPUTATION Right 05/18/2014   Procedure: RIGHT FIFTH RAY AMPUTATION FOOT;  Surgeon: Wylene Simmer, MD;  Location: [facility];  Service: Orthopedics;  Laterality: Right;  . APPENDECTOMY    . BUNIONECTOMY  bilateral  . COLONOSCOPY WITH PROPOFOL N/A 02/02/2014   Procedure: COLONOSCOPY WITH PROPOFOL;  Surgeon: Irene Shipper, MD;  [facility];  Service: Endoscopy;  Laterality: N/A;  . cyst on ovary removed     . CYSTOSCOPY W/ RETROGRADES  03/03/2012   Procedure: CYSTOSCOPY WITH RETROGRADE PYELOGRAM;  Surgeon: Bernestine Amass, MD;  [facility];  Service: Urology;  Laterality: Bilateral;  . CYSTOSCOPY WITH RETROGRADE PYELOGRAM, URETEROSCOPY AND STENT PLACEMENT Left 08/25/2012   Procedure: CYSTOSCOPY WITH RETROGRADE PYELOGRAM, URETEROSCOPY;  Surgeon: Bernestine Amass, MD;  [facility];  Service: Urology;  Laterality: Left;  . DILATION AND CURETTAGE OF UTERUS N/A 02/21/2013   Procedure: DILATATION AND CURETTAGE;  Surgeon: Lyman Speller, MD;  Location: [facility];  Service: Gynecology;  Laterality: N/A;  . DILATION AND CURETTAGE OF UTERUS N/A 04/09/2015   Procedure: [place] IUD removal;  Surgeon: Megan Salon, MD;  Location: [facility];  Service: Gynecology;  Laterality: N/A;  Patient weight 307lbs  .  ESOPHAGOGASTRODUODENOSCOPY (EGD) WITH PROPOFOL N/A 02/02/2014   Procedure: ESOPHAGOGASTRODUODENOSCOPY (EGD) WITH PROPOFOL;  Surgeon: Irene Shipper, MD;  [facility];  Service: Endoscopy;  Laterality: N/A;  . GASTRIC BYPASS  1974  . HOLMIUM LASER APPLICATION Left 10/27/1495   Procedure: HOLMIUM LASER APPLICATION;  Surgeon: Bernestine Amass, MD;  [facility];  Service: Urology;  Laterality: Left;  . LITHOTRIPSY  03/2012  . LITHOTRIPSY  2/16  . THYROIDECTOMY  05/15/2010  . TONSILLECTOMY AND ADENOIDECTOMY    . URETEROSCOPY  03/03/2012   Procedure: URETEROSCOPY;  Surgeon: Bernestine Amass, MD;  [facility];  Service: Urology;  Laterality: Left;    Family History  Problem Relation Age of Onset  . Hypertension Father   . Diabetes Father   . Lung cancer Father   . Heart attack Father        MI at age 67  . Hypertension Mother   . Hyperthyroidism Mother   . Heart disease Mother   . Heart attack Mother        MI at age 40  . Asthma Brother   . Hypertension Brother        younger  . Heart disease Brother        older  . Colon cancer Neg Hx   . Esophageal cancer Neg Hx   . Stomach cancer Neg Hx   . Kidney disease Neg Hx   . Liver disease Neg Hx     Allergies  Allergen Reactions  . Other Anaphylaxis    NUTS  . Penicillins Other (See Comments)    Headache. Has patient had a PCN reaction causing immediate rash, facial/tongue/throat swelling, SOB or lightheadedness with hypotension: No Has patient had a PCN reaction causing severe rash involving mucus membranes or skin necrosis: No Has patient had a PCN reaction that required hospitalization: No Has patient had a PCN reaction occurring within the last 10 years: Yes If all of the above answers are "NO", then may proceed with Cephalosporin use.   [name] Amoxicillin-Pot Clavulanate Other (See Comments)    headache  . Food Hives    Potato  . Tomato Hives    Current Outpatient Prescriptions on File Prior to Visit  Medication  Sig Dispense Refill  . acetaminophen (TYLENOL) 325 MG tablet Take 2 tablets (650 mg total) by mouth every 6 (six) hours as needed for mild pain or moderate pain. 60 tablet 0  . albuterol (PROVENTIL HFA;VENTOLIN HFA) 108 (  90 Base) MCG/ACT inhaler Inhale 2 puffs into the lungs every 6 (six) hours as needed for wheezing or shortness of breath. 1 Inhaler 5  . apixaban (ELIQUIS) 5 MG TABS tablet Take 1 tablet (5 mg total) by mouth daily. (Patient taking differently: Take 5 mg by mouth 2 (two) times daily. )    . calcitRIOL (ROCALTROL) 0.5 MCG capsule Take 1.5 mcg by mouth daily.     . calcium carbonate (TUMS - DOSED IN MG ELEMENTAL CALCIUM) 500 MG chewable tablet Chew 1 tablet (200 mg of elemental calcium total) by mouth 2 (two) times daily with a meal.    . Cyanocobalamin (VITAMIN B-12 IJ) Inject as directed every 30 (thirty) days.    [name] desoximetasone (TOPICORT) 0.05 % cream Apply topically 2 (two) times daily as needed. 60 g 0  . diclofenac sodium (VOLTAREN) 1 % GEL Apply topically 2 (two) times daily as needed.    . diltiazem (CARDIZEM CD) 240 MG 24 hr capsule Take 1 capsule (240 mg total) by mouth daily.    . fluticasone (FLONASE) 50 MCG/ACT nasal spray Place 2 sprays into both nostrils daily as needed for allergies or rhinitis.     . furosemide (LASIX) 40 MG tablet TAKE 1 TABLET BY MOUTH  DAILY (Patient taking differently: TAKE 40MG  BY MOUTH  DAILY) 90 tablet 1  . levocetirizine (XYZAL) 5 MG tablet TAKE 1 TABLET BY MOUTH  EVERY EVENING 90 tablet 3  . levothyroxine (SYNTHROID, LEVOTHROID) 100 MCG tablet Take 300 mcg by mouth daily.     [name] losartan (COZAAR) 50 MG tablet TAKE 1 TABLET(50 MG) BY MOUTH DAILY 90 tablet 1  . neomycin-bacitracin-polymyxin (NEOSPORIN) ointment Apply 1 application topically as needed for wound care. apply to eye    . nystatin (NYSTATIN) powder APPLY TOPICALLY TO THE AFFECTED AREA TWICE DAILY 60 g 5  . omeprazole (PRILOSEC) 40 MG capsule TAKE 1 CAPSULE BY MOUTH  DAILY 90  capsule 1  . potassium chloride SA (K-DUR,KLOR-CON) 20 MEQ tablet Take 1 tablet (20 mEq total) by mouth daily. 30 tablet 3  . SYMBICORT 160-4.5 MCG/ACT inhaler USE 2 PUFFS TWO TIMES DAILY 30.6 g 0  . traMADol (ULTRAM) 50 MG tablet Take 2 tablets (100 mg total) by mouth every 8 (eight) hours as needed for moderate pain. 30 tablet 0  . Vitamin D, Ergocalciferol, (DRISDOL) 50000 units CAPS capsule TAKE 1 CAPSULE BY MOUTH 3  TIMES WEEKLY (Patient taking differently: TAKE 1 CAPSULE BY MOUTH 4 TIMES WEEKLY) 33 capsule 5  . zafirlukast (ACCOLATE) 20 MG tablet TAKE 1 TABLET BY MOUTH 2  TIMES DAILY BEFORE MEALS 180 tablet 0  . fluconazole (DIFLUCAN) 100 MG tablet Take 1 tablet (100 mg total) by mouth once a week. (Patient not taking: Reported on 03/20/2017) 4 tablet 0   No current facility-administered medications on file prior to visit.     Pulse 80   Temp 98.2 F (36.8 C) (Oral)   Ht 5\' 4"  (1.626 m)   Wt (!) 308 lb (139.7 kg)   LMP 06/11/2012   SpO2 98%   BMI 52.87 kg/m       Objective:   Physical Exam  Constitutional: She is oriented to person, place, and time. She appears well-developed and well-nourished.  HENT:  Head: Normocephalic and atraumatic.  Cardiovascular: Normal rate, regular rhythm and normal heart sounds.   No murmur heard. Pulmonary/Chest: Effort normal. No respiratory distress. She has no wheezes. She has no rhonchi. She has rales in the right  lower field.  Musculoskeletal:  3-4+ bilateral LE edema  Neurological: She is alert and oriented to person, place, and time.  Psychiatric: She has a normal mood and affect. Her behavior is normal. Judgment and thought content normal.          Assessment & Plan:  Volume overload- Patient is up 11 pounds since last visit 3 weeks ago. Check cmet bnp, 2d echo, cxr.   Advised pt as follows:  Please increase furosemide to twice daily for 3 days, then return to once daily.  Complete lab work prior to leaving.  Please increase  your furosemide to twice daily for the next 3 days.  Take an additional dose of potassium citrate with your second dose of Lasix only.  Otherwise just do your potassium once a day. You will be contacted about your scheduling for echocardiogram. Go to the ER if you have worsening shortness of breath.

## 2017-03-22 ENCOUNTER — Other Ambulatory Visit: Payer: Self-pay | Admitting: Family

## 2017-03-22 ENCOUNTER — Telehealth: Payer: Self-pay | Admitting: Family

## 2017-03-22 NOTE — Telephone Encounter (Signed)
See mychart.  

## 2017-03-23 ENCOUNTER — Encounter: Payer: Self-pay | Admitting: Family

## 2017-03-25 ENCOUNTER — Ambulatory Visit (INDEPENDENT_AMBULATORY_CARE_PROVIDER_SITE_OTHER): Payer: 59 | Admitting: Family

## 2017-03-25 ENCOUNTER — Encounter: Payer: Self-pay | Admitting: Family

## 2017-03-25 VITALS — BP 137/58 | HR 77 | Temp 98.1°F | Resp 18 | Ht 64.0 in | Wt 304.4 lb

## 2017-03-25 DIAGNOSIS — E8770 Fluid overload, unspecified: Secondary | ICD-10-CM

## 2017-03-25 DIAGNOSIS — L509 Urticaria, unspecified: Secondary | ICD-10-CM

## 2017-03-25 DIAGNOSIS — M2062 Acquired deformities of toe(s), unspecified, left foot: Secondary | ICD-10-CM

## 2017-03-25 LAB — BASIC METABOLIC PANEL
BUN: 19 mg/dL (ref 6–23)
CALCIUM: 8.3 mg/dL — AB (ref 8.4–10.5)
CO2: 31 mEq/L (ref 19–32)
CREATININE: 0.67 mg/dL (ref 0.40–1.20)
Chloride: 99 mEq/L (ref 96–112)
GFR: 95.47 mL/min (ref >60.00)
GLUCOSE: 82 mg/dL (ref 70–99)
Potassium: 3.6 mEq/L (ref 3.5–5.1)
Sodium: 140 mEq/L (ref 135–145)

## 2017-03-25 MED ORDER — FUROSEMIDE 40 MG PO TABS: ORAL_TABLET | ORAL | 1 refills | Status: DC

## 2017-03-25 NOTE — Progress Notes (Signed)
Subjective:    Patient ID: [name], female    DOB: 08/06/1955, 61 y.o.   MRN: [id number]  HPI   Patient is a 61 year old female who presents today for follow-up of her volume overload.  She was seen on 10/26.  At that time she was noted to be up 11 pounds over 3 weeks..  We increase her furosemide to twice daily dosing for 3 days and then had her return to once daily dosing.  Echocardiogram was ordered, however has not yet been completed.  Wt Readings from Last 3 Encounters:  03/25/17 (!) 304 lb 6.4 oz (138.1 kg)  03/20/17 (!) 308 lb (139.7 kg)  02/24/17 297 lb (134.7 kg)   Deformity of left great toe-she reports that her last surgeon recommended removal of the toe.  She is hoping not to do this.  She is requesting referral to another provider.  She notes that she is unable to wear a regular shoe.  She wears a surgical boot instead.  She also notes that she has started to get sore on the top of her left great toe.  She reports that she gets itchy raised rashes anywhere that she scratches.  She also shows me that she has developed a rash beneath where the blood pressure cuff took her blood pressure today.  Review of Systems    see HPI  Past Medical History:  Diagnosis Date  . Abnormal Pap smear    years ago/no biopsy  . Allergic rhinitis   . Anemia, iron deficiency   . Arthritis    back- lower  . Asthma   . Atrial fibrillation ([place]) August 2012   OFF XARELTO LAST MONTH DUE TO BLEEDING IN STOOL  . Bursitis of hip left  . Dysrhythmia    afib, followed by Dr. Stanford Breed   . Edema of both legs   . Fatty liver 01/07/2012  . Fibroid 1974   fibroid cyst on left fallopian tube  . Fibromyalgia   . Foot ulcer ([place])    AREA HEALED RIGHT FOOT  . GERD (gastroesophageal reflux disease)   . Headache(784.0)    occasional sinus headache   . History of blood transfusion JULY 2015  . Hypertension   . Hypoparathyroidism ([place]) 01/07/2011  . Hypothyroidism   . Kidney stone 10/04/15   passed on their own  . Morbid obesity ([place])   . Nephrolithiasis 2/16, 9/16   SEES DR Risa Grill  . Pernicious anemia 02/20/2014   followed by Debbrah Alar  . PONV (postoperative nausea and vomiting)   . Seizures ([place])    infancy secondary to fever  . Thyroid cancer ([place]) 2011   THYROIDECTOMY DONE  . Uterine cancer ([place]) 2014   Mirena IUD  . Vitamin D deficiency      Social History   Social History  . Marital status: Single    Spouse name: N/A  . Number of children: 0  . Years of education: N/A   Occupational History  . works in Insurance claims handler   . 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 Smoking status: Former Smoker    Packs/day: 0.50    Years: 25.00    Types: Cigarettes    Start date: 12/24/1970    Quit date: 05/27/1995  . Smokeless tobacco: Never Used     Comment: quit smoking 19 years ago  . Alcohol use 0.0 oz/week     Comment: 1/2 glass per month  . Drug use: No  .  Sexual activity: Yes    Partners: Male    Birth control/ protection: Post-menopausal   Other Topics Concern  . Not on file   Social History Narrative   Occupation: works in Insurance claims handler - Field seismologist   Single       Former Smoker - quit tobacco 12 years ago.  She was light smoker for 10 years.                 Past Surgical History:  Procedure Laterality Date  . AMPUTATION Right 05/18/2014   Procedure: RIGHT FIFTH RAY AMPUTATION FOOT;  Surgeon: Wylene Simmer, MD;  Location: [facility];  Service: Orthopedics;  Laterality: Right;  . APPENDECTOMY    . BUNIONECTOMY     bilateral  . COLONOSCOPY WITH PROPOFOL N/A 02/02/2014   Procedure: COLONOSCOPY WITH PROPOFOL;  Surgeon: Irene Shipper, MD;  Location: [facility];  Service: Endoscopy;  Laterality: N/A;  . cyst on ovary removed     . CYSTOSCOPY W/ RETROGRADES  03/03/2012   Procedure: CYSTOSCOPY WITH RETROGRADE PYELOGRAM;  Surgeon: Bernestine Amass, MD;  Location: [facility];  Service: Urology;  Laterality:  Bilateral;  . CYSTOSCOPY WITH RETROGRADE PYELOGRAM, URETEROSCOPY AND STENT PLACEMENT Left 08/25/2012   Procedure: CYSTOSCOPY WITH RETROGRADE PYELOGRAM, URETEROSCOPY;  Surgeon: Bernestine Amass, MD;  Location: [facility];  Service: Urology;  Laterality: Left;  . DILATION AND CURETTAGE OF UTERUS N/A 02/21/2013   Procedure: DILATATION AND CURETTAGE;  Surgeon: Lyman Speller, MD;  Location: [facility];  Service: Gynecology;  Laterality: N/A;  . DILATION AND CURETTAGE OF UTERUS N/A 04/09/2015   Procedure: Lane IUD removal;  Surgeon: Megan Salon, MD;  Location: [facility];  Service: Gynecology;  Laterality: N/A;  Patient weight 307lbs  . ESOPHAGOGASTRODUODENOSCOPY (EGD) WITH PROPOFOL N/A 02/02/2014   Procedure: ESOPHAGOGASTRODUODENOSCOPY (EGD) WITH PROPOFOL;  Surgeon: Irene Shipper, MD;  [facility];  Service: Endoscopy;  Laterality: N/A;  . GASTRIC BYPASS  1974  . HOLMIUM LASER APPLICATION Left 11/28/1948   Procedure: HOLMIUM LASER APPLICATION;  Surgeon: Bernestine Amass, MD;  Location[facility];  Service: Urology;  Laterality: Left;  . LITHOTRIPSY  03/2012  . LITHOTRIPSY  2/16  . THYROIDECTOMY  05/15/2010  . TONSILLECTOMY AND ADENOIDECTOMY    . URETEROSCOPY  03/03/2012   Procedure: URETEROSCOPY;  Surgeon: Bernestine Amass, MD;  [facility];  Service: Urology;  Laterality: Left;    Family History  Problem Relation Age of Onset  . Hypertension Father   . Diabetes Father   . Lung cancer Father   . Heart attack Father        MI at age 55  . Hypertension Mother   . Hyperthyroidism Mother   . Heart disease Mother   . Heart attack Mother        MI at age 55  . Asthma Brother   . Hypertension Brother        younger  . Heart disease Brother        older  . Colon cancer Neg Hx   . Esophageal cancer Neg Hx   . Stomach cancer Neg Hx   . Kidney disease Neg Hx   . Liver disease Neg Hx     Allergies  Allergen Reactions  . Other Anaphylaxis    NUTS  . Penicillins Other  (See Comments)    Headache. Has patient had a PCN reaction causing immediate rash, facial/tongue/throat swelling, SOB or lightheadedness with hypotension: No Has  patient had a PCN reaction causing severe rash involving mucus membranes or skin necrosis: No Has patient had a PCN reaction that required hospitalization: No Has patient had a PCN reaction occurring within the last 10 years: Yes If all of the above answers are "NO", then may proceed with Cephalosporin use.   [name] Amoxicillin-Pot Clavulanate Other (See Comments)    headache  . Food Hives    Potato  . Tomato Hives    Current Outpatient Prescriptions on File Prior to Visit  Medication Sig Dispense Refill  . acetaminophen (TYLENOL) 325 MG tablet Take 2 tablets (650 mg total) by mouth every 6 (six) hours as needed for mild pain or moderate pain. 60 tablet 0  . albuterol (PROVENTIL HFA;VENTOLIN HFA) 108 (90 Base) MCG/ACT inhaler Inhale 2 puffs into the lungs every 6 (six) hours as needed for wheezing or shortness of breath. 1 Inhaler 5  . apixaban (ELIQUIS) 5 MG TABS tablet Take 1 tablet (5 mg total) by mouth daily. (Patient taking differently: Take 5 mg by mouth 2 (two) times daily. )    . calcitRIOL (ROCALTROL) 0.5 MCG capsule Take 1.5 mcg by mouth daily.     . calcium carbonate (TUMS - DOSED IN MG ELEMENTAL CALCIUM) 500 MG chewable tablet Chew 1 tablet (200 mg of elemental calcium total) by mouth 2 (two) times daily with a meal.    . citric acid-potassium citrate (POLYCITRA) 1100-334 MG/5ML solution Take 10 mEq by mouth daily.    . Cyanocobalamin (VITAMIN B-12 IJ) Inject as directed every 30 (thirty) days.    [name] desoximetasone (TOPICORT) 0.05 % cream Apply topically 2 (two) times daily as needed. 60 g 0  . diclofenac sodium (VOLTAREN) 1 % GEL Apply topically 2 (two) times daily as needed.    . diltiazem (CARDIZEM CD) 240 MG 24 hr capsule Take 1 capsule (240 mg total) by mouth daily.    . fluticasone (FLONASE) 50 MCG/ACT nasal spray Place  2 sprays into both nostrils daily as needed for allergies or rhinitis.     [name] levocetirizine (XYZAL) 5 MG tablet TAKE 1 TABLET BY MOUTH  EVERY EVENING 90 tablet 3  . levothyroxine (SYNTHROID, LEVOTHROID) 100 MCG tablet Take 300 mcg by mouth daily.     [name] losartan (COZAAR) 50 MG tablet TAKE 1 TABLET(50 MG) BY MOUTH DAILY 90 tablet 1  . neomycin-bacitracin-polymyxin (NEOSPORIN) ointment Apply 1 application topically as needed for wound care. apply to eye    . nystatin (NYSTATIN) powder APPLY TOPICALLY TO THE AFFECTED AREA TWICE DAILY 60 g 5  . omeprazole (PRILOSEC) 40 MG capsule TAKE 1 CAPSULE BY MOUTH  DAILY 90 capsule 1  . SYMBICORT 160-4.5 MCG/ACT inhaler INHALE 2 PUFFS TWO TIMES  DAILY 30.6 g 1  . traMADol (ULTRAM) 50 MG tablet Take 2 tablets (100 mg total) by mouth every 8 (eight) hours as needed for moderate pain. 30 tablet 0  . Vitamin D, Ergocalciferol, (DRISDOL) 50000 units CAPS capsule TAKE 1 CAPSULE BY MOUTH 3  TIMES WEEKLY (Patient taking differently: TAKE 1 CAPSULE BY MOUTH 4 TIMES WEEKLY) 33 capsule 5  . zafirlukast (ACCOLATE) 20 MG tablet TAKE 1 TABLET BY MOUTH 2  TIMES DAILY BEFORE MEALS 180 tablet 0   No current facility-administered medications on file prior to visit.     BP (!) 137/58 (BP Location: Right Arm, Cuff Size: Large)   Pulse 77   Temp 98.1 F (36.7 C) (Oral)   Resp 18   Ht 5\' 4"  (1.626 m)  Wt (!) 304 lb 6.4 oz (138.1 kg)   LMP 06/11/2012   SpO2 97%   BMI 52.25 kg/m    Objective:   Physical Exam  Constitutional: She is oriented to person, place, and time. She appears well-developed and well-nourished.  HENT:  Head: Normocephalic and atraumatic.  Cardiovascular: Normal rate, regular rhythm and normal heart sounds.   No murmur heard. Pulmonary/Chest: Effort normal and breath sounds normal. No respiratory distress. She has no wheezes.  Musculoskeletal:  2-3+ bilateral LE edema  Neurological: She is alert and oriented to person, place, and time.    Psychiatric: She has a normal mood and affect. Her behavior is normal. Judgment and thought content normal.  Skin: Stage II pressure ulcer noted on left dorsal toe.  Raised erythematous urticarial-like rash noted right upper extremity in shape of blood pressure cuff. Musculoskeletal: Bony deformity noted of left great toe deformity of left great toe-          Assessment & Plan:  Deformity of left great toe-will refer to podiatry for further evaluation.  She is to continue surgical boot to relieve pressure and reduce risk of worsening skin ulcer.  Urticaria- will refer to allergist for further evaluation.  Hypocalcemia- she continues calcium supplementation.  We will check follow-up calcium level.  Volume overload-this is improving slightly.  Weight is down a few pounds.  She still has some edema and is above her dry weight.  I have asked her to continue Lasix 40 mg in the morning  but add 20 mg in the afternoon.

## 2017-03-25 NOTE — Patient Instructions (Signed)
Please increase your Lasix from 40 mg once daily in the morning to 40 mg in the morning and 20 mg in the afternoon. He will be contacted by a referral to the foot doctor into the allergist. Please complete lab work prior to leaving.

## 2017-03-26 ENCOUNTER — Telehealth: Payer: Self-pay | Admitting: Family

## 2017-03-26 DIAGNOSIS — E876 Hypokalemia: Secondary | ICD-10-CM

## 2017-03-26 NOTE — Telephone Encounter (Signed)
See my chart message

## 2017-03-27 ENCOUNTER — Telehealth: Payer: Self-pay | Admitting: *Deleted

## 2017-03-27 ENCOUNTER — Encounter: Payer: Self-pay | Admitting: Family

## 2017-03-27 MED ORDER — DOXYCYCLINE HYCLATE 100 MG PO TABS: 100.0000 mg | ORAL_TABLET | Freq: Two times a day (BID) | ORAL | 0 refills | Status: DC

## 2017-03-27 NOTE — Telephone Encounter (Signed)
Received FMLA/STD paperwork request from [facility] for STD review, printed requested OV notes; forwarded to provider/SLS 11/02

## 2017-03-27 NOTE — Telephone Encounter (Signed)
Contacted patient in response to her note that her toes more red.  She states that the toe is more reddened than it was a day that we saw her here in the office.  I advised her that I have placed a referral to podiatry.  In the meantime we will go ahead and send doxycycline to cover for infection.  She is advised to let me know if redness worsens on antibiotic or if it does not improve.   Otherwise, she will plan to keep her follow-up appointment with me in 2 weeks.  Patient verbalizes understanding.

## 2017-03-30 ENCOUNTER — Ambulatory Visit (HOSPITAL_BASED_OUTPATIENT_CLINIC_OR_DEPARTMENT_OTHER): Payer: 59 | Admitting: Hematology & Oncology

## 2017-03-30 ENCOUNTER — Other Ambulatory Visit (HOSPITAL_BASED_OUTPATIENT_CLINIC_OR_DEPARTMENT_OTHER): Payer: 59

## 2017-03-30 VITALS — BP 123/53 | HR 83 | Temp 98.5°F | Resp 20 | Wt 301.8 lb

## 2017-03-30 DIAGNOSIS — D510 Vitamin B12 deficiency anemia due to intrinsic factor deficiency: Secondary | ICD-10-CM | POA: Diagnosis not present

## 2017-03-30 DIAGNOSIS — D500 Iron deficiency anemia secondary to blood loss (chronic): Secondary | ICD-10-CM

## 2017-03-30 DIAGNOSIS — D508 Other iron deficiency anemias: Secondary | ICD-10-CM

## 2017-03-30 LAB — CMP (CANCER CENTER ONLY)
ALK PHOS: 107 U/L — AB (ref 26–84)
ALT(SGPT): 28 U/L (ref 10–47)
AST: 23 U/L (ref 11–38)
Albumin: 3.4 g/dL (ref 3.3–5.5)
BUN, Bld: 14 mg/dL (ref 7–22)
CALCIUM: 8.7 mg/dL (ref 8.0–10.3)
CHLORIDE: 99 meq/L (ref 98–108)
CO2: 30 meq/L (ref 18–33)
Creat: 0.8 mg/dl (ref 0.6–1.2)
GLUCOSE: 87 mg/dL (ref 73–118)
POTASSIUM: 3.7 meq/L (ref 3.3–4.7)
Sodium: 144 mEq/L (ref 128–145)
Total Bilirubin: 0.6 mg/dl (ref 0.20–1.60)
Total Protein: 7.5 g/dL (ref 6.4–8.1)

## 2017-03-30 LAB — CBC WITH DIFFERENTIAL (CANCER CENTER ONLY)
BASO#: 0.1 10*3/uL (ref 0.0–0.2)
BASO%: 0.4 % (ref 0.0–2.0)
EOS%: 1.5 % (ref 0.0–7.0)
Eosinophils Absolute: 0.2 10*3/uL (ref 0.0–0.5)
HEMATOCRIT: 43.8 % (ref 34.8–46.6)
HGB: 14 g/dL (ref 11.6–15.9)
LYMPH#: 1.7 10*3/uL (ref 0.9–3.3)
LYMPH%: 14 % (ref 14.0–48.0)
MCH: 27.6 pg (ref 26.0–34.0)
MCHC: 32 g/dL (ref 32.0–36.0)
MCV: 86 fL (ref 81–101)
MONO#: 0.8 10*3/uL (ref 0.1–0.9)
MONO%: 6.4 % (ref 0.0–13.0)
NEUT#: 9.5 10*3/uL — ABNORMAL HIGH (ref 1.5–6.5)
NEUT%: 77.7 % (ref 39.6–80.0)
PLATELETS: 157 10*3/uL (ref 145–400)
RBC: 5.07 10*6/uL (ref 3.70–5.32)
RDW: 14.1 % (ref 11.1–15.7)
WBC: 12.2 10*3/uL — ABNORMAL HIGH (ref 3.9–10.0)

## 2017-03-30 LAB — TECHNOLOGIST REVIEW CHCC SATELLITE

## 2017-03-30 NOTE — Progress Notes (Signed)
Hematology and Oncology Follow Up Visit  [name] [id number] [birth year] 61 y.o. 03/30/2017   Principle Diagnosis:  Iron deficiency anemia Pernicious anemia Gastric bypass with malabsorption  Current Therapy:   IV iron as indicated - last received in July 2018 Vitamin B-12 1 mg IM every month - with PCP   Interim History:  [name] is here today for follow-up.  She says that she is feeling better.  She is on antibiotics for her infected right great toe.  I am not sure how more days she has for this.  She says that she already has some diarrhea.  I told her to try some probiotic.  She uses a walker.  She has some swelling in her left leg.  She says this is a little bit better.  She has had no cough or shortness of breath.  She has had no nausea or vomiting.  She has had no bleeding.  We last saw her back in July, her ferritin was over 1000 but her iron saturation was only 13%.  Her serum iron was 38.  She did receive a dose of iron.  She is still working.  It sounds like she might be going to some relatives for Thanksgiving.  She has had no fever.  She has had no rashes.  ECOG Performance Status: 0 - Asymptomatic  Medications:  Allergies as of 03/30/2017      Reactions   Other Anaphylaxis   NUTS   Penicillins Other (See Comments)   Headache. Has patient had a PCN reaction causing immediate rash, facial/tongue/throat swelling, SOB or lightheadedness with hypotension: No Has patient had a PCN reaction causing severe rash involving mucus membranes or skin necrosis: No Has patient had a PCN reaction that required hospitalization: No Has patient had a PCN reaction occurring within the last 10 years: Yes If all of the above answers are "NO", then may proceed with Cephalosporin use.   Amoxicillin-pot Clavulanate Other (See Comments)   headache   Food Hives   Potato   Tomato Hives      Medication List        Accurate as of 03/30/17  3:58 PM. Always use your most  recent med list.          acetaminophen 325 MG tablet Commonly known as:  TYLENOL Take 2 tablets (650 mg total) by mouth every 6 (six) hours as needed for mild pain or moderate pain.   albuterol 108 (90 Base) MCG/ACT inhaler Commonly known as:  PROVENTIL HFA;VENTOLIN HFA Inhale 2 puffs into the lungs every 6 (six) hours as needed for wheezing or shortness of breath.   apixaban 5 MG Tabs tablet Commonly known as:  ELIQUIS Take 1 tablet (5 mg total) by mouth daily.   calcitRIOL 0.5 MCG capsule Commonly known as:  ROCALTROL Take 1.5 mcg by mouth daily.   calcium carbonate 500 MG chewable tablet Commonly known as:  TUMS - dosed in mg elemental calcium Chew 1 tablet (200 mg of elemental calcium total) by mouth 2 (two) times daily with a meal.   citric acid-potassium citrate 1100-334 MG/5ML solution Commonly known as:  POLYCITRA Take 10 mEq by mouth daily.   desoximetasone 0.05 % cream Commonly known as:  TOPICORT Apply topically 2 (two) times daily as needed.   diclofenac sodium 1 % Gel Commonly known as:  VOLTAREN Apply topically 2 (two) times daily as needed.   diltiazem 240 MG 24 hr capsule Commonly known as:  CARDIZEM CD Take  1 capsule (240 mg total) by mouth daily.   doxycycline 100 MG tablet Commonly known as:  VIBRA-TABS Take 1 tablet (100 mg total) by mouth 2 (two) times daily.   fluticasone 50 MCG/ACT nasal spray Commonly known as:  FLONASE Place 2 sprays into both nostrils daily as needed for allergies or rhinitis.   furosemide 40 MG tablet Commonly known as:  LASIX 1 tab by mouth in the AM and 1/2 tab by mouth in the PM   levocetirizine 5 MG tablet Commonly known as:  XYZAL TAKE 1 TABLET BY MOUTH  EVERY EVENING   levothyroxine 100 MCG tablet Commonly known as:  SYNTHROID, LEVOTHROID Take 300 mcg by mouth daily.   losartan 50 MG tablet Commonly known as:  COZAAR TAKE 1 TABLET(50 MG) BY MOUTH DAILY   neomycin-bacitracin-polymyxin ointment Commonly  known as:  NEOSPORIN Apply 1 application topically as needed for wound care. apply to eye   nystatin powder Commonly known as:  nystatin APPLY TOPICALLY TO THE AFFECTED AREA TWICE DAILY   omeprazole 40 MG capsule Commonly known as:  PRILOSEC TAKE 1 CAPSULE BY MOUTH  DAILY   SYMBICORT 160-4.5 MCG/ACT inhaler Generic drug:  budesonide-formoterol INHALE 2 PUFFS TWO TIMES  DAILY   traMADol 50 MG tablet Commonly known as:  ULTRAM Take 2 tablets (100 mg total) by mouth every 8 (eight) hours as needed for moderate pain.   VITAMIN B-12 IJ Inject as directed every 30 (thirty) days.   Vitamin D (Ergocalciferol) 50000 units Caps capsule Commonly known as:  DRISDOL TAKE 1 CAPSULE BY MOUTH 3  TIMES WEEKLY   zafirlukast 20 MG tablet Commonly known as:  ACCOLATE TAKE 1 TABLET BY MOUTH 2  TIMES DAILY BEFORE MEALS       Allergies:  Allergies  Allergen Reactions  . Other Anaphylaxis    NUTS  . Penicillins Other (See Comments)    Headache. Has patient had a PCN reaction causing immediate rash, facial/tongue/throat swelling, SOB or lightheadedness with hypotension: No Has patient had a PCN reaction causing severe rash involving mucus membranes or skin necrosis: No Has patient had a PCN reaction that required hospitalization: No Has patient had a PCN reaction occurring within the last 10 years: Yes If all of the above answers are "NO", then may proceed with Cephalosporin use.   [name] Amoxicillin-Pot Clavulanate Other (See Comments)    headache  . Food Hives    Potato  . Tomato Hives    Past Medical History, Surgical history, Social history, and Family History were reviewed and updated.  Review of Systems: As stated in the interim history  Physical Exam:  weight is 301 lb 12 oz (136.9 kg) (abnormal). Her oral temperature is 98.5 F (36.9 C). Her blood pressure is 123/53 (abnormal) and her pulse is 83. Her respiration is 20 and oxygen saturation is 97%.   Wt Readings from Last 3  Encounters:  03/30/17 (!) 301 lb 12 oz (136.9 kg)  03/25/17 (!) 304 lb 6.4 oz (138.1 kg)  03/20/17 (!) 308 lb (139.7 kg)    Obese white female in no obvious distress.  Head and neck exam shows no ocular or oral lesions.  There are no palpable cervical or supraclavicular lymph nodes.  Lungs are clear bilaterally.  Cardiac exam regular rate and rhythm with no murmurs, rubs or bruits.  Abdomen is obese but soft.  She has good bowel sounds.  There is no fluid wave.  There is no palpable liver or spleen tip.  Back  exam shows no tenderness over the spine, ribs or hips.  Extremities shows no clubbing, cyanosis or edema.  She has the boot on the left foot.  Neurological exam shows no focal neurological deficits.  Lab Results  Component Value Date   WBC 12.2 (H) 03/30/2017   HGB 14.0 03/30/2017   HCT 43.8 03/30/2017   MCV 86 03/30/2017   PLT 157 03/30/2017   Lab Results  Component Value Date   FERRITIN 1,086 (H) 11/27/2016   IRON 38 (L) 11/27/2016   TIBC 284 11/27/2016   UIBC 245 11/27/2016   IRONPCTSAT 13 (L) 11/27/2016   Lab Results  Component Value Date   RETICCTPCT 3.1 (H) 03/30/2015   RBC 5.07 03/30/2017   RETICCTABS 131.4 03/30/2015   No results found for: [facility] Lab Results  Component Value Date   IGGSERUM 1250 06/09/2007   IGA 334 06/09/2007   IGMSERUM 244 06/09/2007   Lab Results  Component Value Date   TOTALPROTELP 7.0 06/09/2007     Chemistry      Component Value Date/Time   NA 144 03/30/2017 1455   NA 139 07/30/2015 1302   K 3.7 03/30/2017 1455   K 3.5 07/30/2015 1302   CL 99 03/30/2017 1455   CO2 30 03/30/2017 1455   CO2 25 07/30/2015 1302   BUN 14 03/30/2017 1455   BUN 16.8 07/30/2015 1302   CREATININE 0.8 03/30/2017 1455   CREATININE 0.8 07/30/2015 1302      Component Value Date/Time   CALCIUM 8.7 03/30/2017 1455   CALCIUM 8.8 07/30/2015 1302   ALKPHOS 107 (H) 03/30/2017 1455   ALKPHOS 99 07/30/2015 1302   AST 23  03/30/2017 1455   AST 36 (H) 07/30/2015 1302   ALT 28 03/30/2017 1455   ALT 68 (H) 07/30/2015 1302   BILITOT 0.60 03/30/2017 1455   BILITOT 0.41 07/30/2015 1302      Impression and Plan: Ms. Nudo is a very pleasant 61 yo caucasian female with iron deficiency anemia secondary to malabsorption after gastric bypass. She also has pernicious anemia and receives monthly B 12 injections with her PCP.   We will see what her iron levels are.  It is possible that she might need a dose of IV iron.  She gets her vitamin B12 at her family doctor.  Hopefully, we will see her back, she will be out of this boot and be able to walk in.    We will plan to see her back in another 4 months.  We will get her through the holidays and winter time.     Volanda Napoleon, MD 11/5/20183:58 PM

## 2017-03-31 ENCOUNTER — Telehealth: Payer: Self-pay | Admitting: Family

## 2017-03-31 LAB — IRON AND TIBC
%SAT: 17 % — ABNORMAL LOW (ref 21–57)
IRON: 47 ug/dL (ref 41–142)
TIBC: 274 ug/dL (ref 236–444)
UIBC: 227 ug/dL (ref 120–384)

## 2017-03-31 LAB — FERRITIN: Ferritin: 1134 ng/ml — ABNORMAL HIGH (ref 9–269)

## 2017-04-01 ENCOUNTER — Other Ambulatory Visit (INDEPENDENT_AMBULATORY_CARE_PROVIDER_SITE_OTHER): Payer: 59

## 2017-04-01 ENCOUNTER — Telehealth: Payer: Self-pay | Admitting: *Deleted

## 2017-04-01 DIAGNOSIS — E876 Hypokalemia: Secondary | ICD-10-CM

## 2017-04-01 LAB — BASIC METABOLIC PANEL
BUN: 16 mg/dL (ref 6–23)
CALCIUM: 8.8 mg/dL (ref 8.4–10.5)
CO2: 30 mEq/L (ref 19–32)
Chloride: 99 mEq/L (ref 96–112)
Creatinine, Ser: 0.8 mg/dL (ref 0.40–1.20)
GFR: 77.8 mL/min (ref >60.00)
Glucose, Bld: 95 mg/dL (ref 70–99)
Potassium: 3.8 mEq/L (ref 3.5–5.1)
Sodium: 139 mEq/L (ref 135–145)

## 2017-04-01 MED ORDER — TRAMADOL HCL 50 MG PO TABS: 100.0000 mg | ORAL_TABLET | Freq: Three times a day (TID) | ORAL | 0 refills | Status: DC | PRN

## 2017-04-01 NOTE — Telephone Encounter (Signed)
Rx faxed to pharmacy  

## 2017-04-01 NOTE — Telephone Encounter (Signed)
Received fax requesting refill of tramadol 50mg .  Last tramadol RX: 02/23/17, #30 Last OV: 03/25/17 Next OV: 05/25/17 UDS: 01/12/17, moderate. Obtain at f/u in December.  Rx printed and forwarded to PCP for signature.

## 2017-04-06 ENCOUNTER — Ambulatory Visit (HOSPITAL_BASED_OUTPATIENT_CLINIC_OR_DEPARTMENT_OTHER): Payer: 59

## 2017-04-06 VITALS — BP 150/56 | HR 72 | Temp 97.6°F | Resp 18

## 2017-04-06 DIAGNOSIS — D508 Other iron deficiency anemias: Secondary | ICD-10-CM

## 2017-04-06 MED ORDER — SODIUM CHLORIDE 0.9 % IV SOLN
510.0000 mg | Freq: Once | INTRAVENOUS | Status: AC
  Administered 2017-04-06: 510 mg via INTRAVENOUS
  Filled 2017-04-06: qty 17

## 2017-04-08 ENCOUNTER — Telehealth: Payer: Self-pay | Admitting: Family

## 2017-04-08 ENCOUNTER — Encounter: Payer: Self-pay | Admitting: Family

## 2017-04-08 ENCOUNTER — Ambulatory Visit: Payer: 59 | Admitting: Family

## 2017-04-08 VITALS — BP 145/60 | HR 85 | Temp 98.1°F | Resp 16 | Ht 64.0 in | Wt 307.6 lb

## 2017-04-08 DIAGNOSIS — L03032 Cellulitis of left toe: Secondary | ICD-10-CM | POA: Diagnosis not present

## 2017-04-08 DIAGNOSIS — E8770 Fluid overload, unspecified: Secondary | ICD-10-CM | POA: Diagnosis not present

## 2017-04-08 LAB — BASIC METABOLIC PANEL
BUN: 16 mg/dL (ref 6–23)
CHLORIDE: 97 meq/L (ref 96–112)
CO2: 30 mEq/L (ref 19–32)
CREATININE: 0.75 mg/dL (ref 0.40–1.20)
Calcium: 8.4 mg/dL (ref 8.4–10.5)
GFR: 83.81 mL/min (ref >60.00)
GLUCOSE: 91 mg/dL (ref 70–99)
Potassium: 3.6 mEq/L (ref 3.5–5.1)
SODIUM: 138 meq/L (ref 135–145)

## 2017-04-08 NOTE — Patient Instructions (Signed)
Please complete lab work prior to leaving. Weigh daily, call if your weight goes to 310 or above.

## 2017-04-08 NOTE — Telephone Encounter (Signed)
See my chart message

## 2017-04-08 NOTE — Progress Notes (Signed)
Subjective:    Patient ID: [name], female    DOB: [birth year], 61 y.o.   MRN: [id number]  HPI  [name] is a 61 yr old female who presents today for follow up of her volume overload.  Last visit we increased her lasix from 40mg  po in AM to 40mg  AM and 20mg  PM.  She reports that  swelling is about the same. Wt Readings from Last 3 Encounters:  04/08/17 (!) 307 lb 9.6 oz (139.5 kg)  03/30/17 (!) 301 lb 12 oz (136.9 kg)  03/25/17 (!) 304 lb 6.4 oz (138.1 kg)     Cellulitis- left great toe. Pt called back a few days after her last visit (11/2) noting increased redness of her left great toe. Rx was sent for doxycycline.  She reports improvement in the toe redness.  She did have some oozing at the site of her toenail which has resolved.  She denies pain.  Review of Systems See HPI  Past Medical History:  Diagnosis Date  . Abnormal Pap smear    years ago/no biopsy  . Allergic rhinitis   . Anemia, iron deficiency   . Arthritis    back- lower  . Asthma   . Atrial fibrillation ([place]) August 2012   OFF XARELTO LAST MONTH DUE TO BLEEDING IN STOOL  . Bursitis of hip left  . Dysrhythmia    afib, followed by [name]   . Edema of both legs   . Fatty liver 01/07/2012  . Fibroid 1974   fibroid cyst on left fallopian tube  . Fibromyalgia   . Foot ulcer ([place])    AREA HEALED RIGHT FOOT  . GERD (gastroesophageal reflux disease)   . Headache(784.0)    occasional sinus headache   . History of blood transfusion JULY 2015  . Hypertension   . Hypoparathyroidism ([place]) 01/07/2011  . Hypothyroidism   . Kidney stone 10-01-15   passed on their own  . Morbid obesity ([place])   . Nephrolithiasis 2/16, 9/16   SEES [name]  . Pernicious anemia 02/20/2014   followed by [name]  . PONV (postoperative nausea and vomiting)   . Seizures ([place])    infancy secondary to fever  . Thyroid cancer ([place]) 2011   THYROIDECTOMY DONE  . Uterine cancer ([place]) 2014   Mirena IUD  . Vitamin D  deficiency      Social History   Socioeconomic History  . Marital status: Single    Spouse name: Not on file  . Number of children: 0  . Years of education: Not on file  . Highest education level: Not on file  Social Needs  . Financial resource strain: Not on file  . Food insecurity - worry: Not on file  . Food insecurity - inability: Not on file  . Transportation needs - medical: Not on file  . Transportation needs - non-medical: Not on file  Occupational History  . Occupation: works in Insurance claims handler  . Occupation: DESIGN COMPUTER CHIP    Employer: [employer]  Tobacco Use  . Smoking status: Former Smoker    Packs/day: 0.50    Years: 25.00    Pack years: 12.50    Types: Cigarettes    Start date: 12/24/1970    Last attempt to quit: 05/27/1995    Years since quitting: 21.8  . Smokeless tobacco: Never Used  . Tobacco comment: quit smoking 19 years ago  Substance and Sexual Activity  . Alcohol use: Yes  Alcohol/week: 0.0 oz    Comment: 1/2 glass per month  . Drug use: No  . Sexual activity: Yes    Partners: Male    Birth control/protection: Post-menopausal  Other Topics Concern  . Not on file  Social History Narrative   Occupation: works in Insurance claims handler - Field seismologist   Single       Former Smoker - quit tobacco 12 years ago.  She was light smoker for 10 years.                 Past Surgical History:  Procedure Laterality Date  . APPENDECTOMY    . BUNIONECTOMY     bilateral  . cyst on ovary removed     . GASTRIC BYPASS  1974  . LITHOTRIPSY  03/2012  . LITHOTRIPSY  2/16  . THYROIDECTOMY  05/15/2010  . TONSILLECTOMY AND ADENOIDECTOMY      Family History  Problem Relation Age of Onset  . Hypertension Father   . Diabetes Father   . Lung cancer Father   . Heart attack Father        MI at age 70  . Hypertension Mother   . Hyperthyroidism Mother   . Heart disease Mother   . Heart attack Mother        MI at age 23  . Asthma Brother     . Hypertension Brother        younger  . Heart disease Brother        older  . Colon cancer Neg Hx   . Esophageal cancer Neg Hx   . Stomach cancer Neg Hx   . Kidney disease Neg Hx   . Liver disease Neg Hx     Allergies  Allergen Reactions  . Other Anaphylaxis    NUTS  . Penicillins Other (See Comments)    Headache. Has patient had a PCN reaction causing immediate rash, facial/tongue/throat swelling, SOB or lightheadedness with hypotension: No Has patient had a PCN reaction causing severe rash involving mucus membranes or skin necrosis: No Has patient had a PCN reaction that required hospitalization: No Has patient had a PCN reaction occurring within the last 10 years: Yes If all of the above answers are "NO", then may proceed with Cephalosporin use.   [name] Amoxicillin-Pot Clavulanate Other (See Comments)    headache  . Food Hives    Potato  . Tomato Hives    Current Outpatient Medications on File Prior to Visit  Medication Sig Dispense Refill  . acetaminophen (TYLENOL) 325 MG tablet Take 2 tablets (650 mg total) by mouth every 6 (six) hours as needed for mild pain or moderate pain. 60 tablet 0  . albuterol (PROVENTIL HFA;VENTOLIN HFA) 108 (90 Base) MCG/ACT inhaler Inhale 2 puffs into the lungs every 6 (six) hours as needed for wheezing or shortness of breath. 1 Inhaler 5  . apixaban (ELIQUIS) 5 MG TABS tablet Take 1 tablet (5 mg total) by mouth daily. (Patient taking differently: Take 5 mg by mouth 2 (two) times daily. )    . calcitRIOL (ROCALTROL) 0.5 MCG capsule Take 1.5 mcg by mouth daily.     . calcium carbonate (TUMS - DOSED IN MG ELEMENTAL CALCIUM) 500 MG chewable tablet Chew 1 tablet (200 mg of elemental calcium total) by mouth 2 (two) times daily with a meal.    . citric acid-potassium citrate (POLYCITRA) 1100-334 MG/5ML solution Take 10 mEq by mouth daily.    . Cyanocobalamin (VITAMIN B-12 IJ) Inject  as directed every 30 (thirty) days.    [name] desoximetasone (TOPICORT) 0.05  % cream Apply topically 2 (two) times daily as needed. 60 g 0  . diclofenac sodium (VOLTAREN) 1 % GEL Apply topically 2 (two) times daily as needed.    . diltiazem (CARDIZEM CD) 240 MG 24 hr capsule Take 1 capsule (240 mg total) by mouth daily.    . fluticasone (FLONASE) 50 MCG/ACT nasal spray Place 2 sprays into both nostrils daily as needed for allergies or rhinitis.     . furosemide (LASIX) 40 MG tablet 1 tab by mouth in the AM and 1/2 tab by mouth in the PM 90 tablet 1  . levocetirizine (XYZAL) 5 MG tablet TAKE 1 TABLET BY MOUTH  EVERY EVENING 90 tablet 3  . levothyroxine (SYNTHROID, LEVOTHROID) 100 MCG tablet Take 300 mcg by mouth daily.     [name] losartan (COZAAR) 50 MG tablet TAKE 1 TABLET(50 MG) BY MOUTH DAILY 90 tablet 1  . neomycin-bacitracin-polymyxin (NEOSPORIN) ointment Apply 1 application topically as needed for wound care. apply to eye    . nystatin (NYSTATIN) powder APPLY TOPICALLY TO THE AFFECTED AREA TWICE DAILY 60 g 5  . omeprazole (PRILOSEC) 40 MG capsule TAKE 1 CAPSULE BY MOUTH  DAILY 90 capsule 1  . SYMBICORT 160-4.5 MCG/ACT inhaler INHALE 2 PUFFS TWO TIMES  DAILY 30.6 g 1  . traMADol (ULTRAM) 50 MG tablet Take 2 tablets (100 mg total) every 8 (eight) hours as needed by mouth for moderate pain. 30 tablet 0  . Vitamin D, Ergocalciferol, (DRISDOL) 50000 units CAPS capsule TAKE 1 CAPSULE BY MOUTH 3  TIMES WEEKLY (Patient taking differently: TAKE 1 CAPSULE BY MOUTH 4 TIMES WEEKLY) 33 capsule 5  . zafirlukast (ACCOLATE) 20 MG tablet TAKE 1 TABLET BY MOUTH 2  TIMES DAILY BEFORE MEALS 180 tablet 0   No current facility-administered medications on file prior to visit.     BP (!) 145/60 (BP Location: Right Arm, Cuff Size: Large)   Pulse 85   Temp 98.1 F (36.7 C) (Oral)   Resp 16   Ht 5\' 4"  (1.626 m)   Wt (!) 307 lb 9.6 oz (139.5 kg)   LMP 06/11/2012   SpO2 97%   BMI 52.80 kg/m       Objective:   Physical Exam  Constitutional: She is oriented to person, place, and time.  She appears well-developed and well-nourished.  Eyes: No scleral icterus.  Cardiovascular: Normal rate, regular rhythm and normal heart sounds.  No murmur heard. Pulmonary/Chest: Effort normal and breath sounds normal. No respiratory distress. She has no wheezes.  Musculoskeletal:  2+ bilateral lower extremity edema  Neurological: She is alert and oriented to person, place, and time.  Skin: Skin is warm and dry.  Very mild erythema noted left great toe  Psychiatric: She has a normal mood and affect. Her behavior is normal. Judgment and thought content normal.           Assessment & Plan:  Volume overload- will check follow up bmet.  She appears compensated at this time.  I would like for her to keep a close eye on her home weights.  I have advised her to call me if her home weight goes to 310 or above.  At that time I would plan to increase her furosemide to 40 mg twice daily.  Cellulitis left great toe-appears improved.  I believe that the redness that is present currently is due to pressure to the toe and  her orthopedic shoe.  I have advised the patient to let me know if increased pain swelling redness or drainage.  Monitor for now.

## 2017-04-13 ENCOUNTER — Encounter: Payer: Self-pay | Admitting: Family

## 2017-04-13 ENCOUNTER — Ambulatory Visit (HOSPITAL_BASED_OUTPATIENT_CLINIC_OR_DEPARTMENT_OTHER)
Admission: RE | Admit: 2017-04-13 | Discharge: 2017-04-13 | Disposition: A | Discharge status: Home / Self Care | Payer: 59 | Source: Ambulatory Visit | Attending: Family | Admitting: Family

## 2017-04-13 ENCOUNTER — Ambulatory Visit: Payer: 59 | Admitting: Podiatry

## 2017-04-13 DIAGNOSIS — R931 Abnormal findings on diagnostic imaging of heart and coronary circulation: Secondary | ICD-10-CM | POA: Diagnosis not present

## 2017-04-13 DIAGNOSIS — R609 Edema, unspecified: Secondary | ICD-10-CM | POA: Insufficient documentation

## 2017-04-13 NOTE — Telephone Encounter (Signed)
Opened in error

## 2017-04-13 NOTE — Progress Notes (Signed)
Echocardiogram 2D Echocardiogram has been performed.  [name] 04/13/2017, 4:02 PM

## 2017-04-15 ENCOUNTER — Ambulatory Visit (INDEPENDENT_AMBULATORY_CARE_PROVIDER_SITE_OTHER): Payer: 59

## 2017-04-15 DIAGNOSIS — E538 Deficiency of other specified B group vitamins: Secondary | ICD-10-CM | POA: Diagnosis not present

## 2017-04-15 MED ORDER — CYANOCOBALAMIN 1000 MCG/ML IJ SOLN
1000.0000 ug | Freq: Once | INTRAMUSCULAR | Status: AC
  Administered 2017-04-15: 1000 ug via INTRAMUSCULAR

## 2017-04-15 NOTE — Progress Notes (Signed)
Pre visit review using our clinic tool,if applicable. No additional management support is needed unless otherwise documented below in the visit note.   Patient in for B12 injection per order from M. Edwena Blow, NP.  Patient has B12 deficiency.   Patient requested weight upon arrival for fluid monitoring. Weight = 304.8lb No complaints voiced this visit.  Given 1000 mcg IM left deltoid. Patient tolerated well. Next months appointment scheduled for patient.

## 2017-04-15 NOTE — Progress Notes (Signed)
Wt Readings from Last 3 Encounters:  04/08/17 (!) 307 lb 9.6 oz (139.5 kg)  03/30/17 (!) 301 lb 12 oz (136.9 kg)  03/25/17 (!) 304 lb 6.4 oz (138.1 kg)   Weight OK.  Noted.

## 2017-04-22 ENCOUNTER — Other Ambulatory Visit: Payer: Self-pay | Admitting: Family

## 2017-04-22 DIAGNOSIS — J3081 Allergic rhinitis due to animal (cat) (dog) hair and dander: Secondary | ICD-10-CM | POA: Diagnosis not present

## 2017-04-22 DIAGNOSIS — J301 Allergic rhinitis due to pollen: Secondary | ICD-10-CM | POA: Diagnosis not present

## 2017-04-22 DIAGNOSIS — J4540 Moderate persistent asthma, uncomplicated: Secondary | ICD-10-CM | POA: Diagnosis not present

## 2017-04-22 DIAGNOSIS — J3089 Other allergic rhinitis: Secondary | ICD-10-CM | POA: Diagnosis not present

## 2017-05-05 ENCOUNTER — Ambulatory Visit: Payer: Self-pay | Admitting: Podiatry

## 2017-05-15 ENCOUNTER — Ambulatory Visit: Payer: 59 | Admitting: Podiatry

## 2017-05-15 ENCOUNTER — Encounter: Payer: Self-pay | Admitting: Podiatry

## 2017-05-15 ENCOUNTER — Ambulatory Visit: Payer: 59

## 2017-05-15 ENCOUNTER — Ambulatory Visit (INDEPENDENT_AMBULATORY_CARE_PROVIDER_SITE_OTHER): Payer: 59

## 2017-05-15 VITALS — BP 117/68 | HR 77 | Resp 16

## 2017-05-15 DIAGNOSIS — M2041 Other hammer toe(s) (acquired), right foot: Secondary | ICD-10-CM

## 2017-05-15 DIAGNOSIS — M779 Enthesopathy, unspecified: Secondary | ICD-10-CM | POA: Diagnosis not present

## 2017-05-15 DIAGNOSIS — M2042 Other hammer toe(s) (acquired), left foot: Secondary | ICD-10-CM

## 2017-05-15 DIAGNOSIS — M2030 Hallux varus (acquired), unspecified foot: Secondary | ICD-10-CM

## 2017-05-15 DIAGNOSIS — M79672 Pain in left foot: Secondary | ICD-10-CM | POA: Diagnosis not present

## 2017-05-15 DIAGNOSIS — M79671 Pain in right foot: Secondary | ICD-10-CM

## 2017-05-15 NOTE — Progress Notes (Signed)
Subjective:  Patient ID: [name], female    DOB: 11-15-1955,  MRN: [id number]  Chief Complaint  Patient presents with  . Callouses    Left foot; plantar forefoot & midfoot; pt stated, "Has had a history of surgeries on both of my feet; I want to know how to make sure my big toe on right foot from keeping from spreading apart"    61 y.o. female presents with the above complaint.  Patient reports bilateral foot pain with history of multiple surgeries to both feet.  Complains of difficulty getting shoes on both feet due to the fact that her big toes point and words.  Reports more problems with her left foot as opposed to her right.  Reports multiple surgeries including Dr. Valentina Lucks at this practice.  Here for surgical consult for possible surgical intervention  Past Medical History:  Diagnosis Date  . Abnormal Pap smear    years ago/no biopsy  . Allergic rhinitis   . Anemia, iron deficiency   . Arthritis    back- lower  . Asthma   . Atrial fibrillation ([place]) August 2012   OFF XARELTO LAST MONTH DUE TO BLEEDING IN STOOL  . Bursitis of hip left  . Dysrhythmia    afib, followed by Dr. Stanford Breed   . Edema of both legs   . Fatty liver 01/07/2012  . Fibroid 1974   fibroid cyst on left fallopian tube  . Fibromyalgia   . Foot ulcer ([place])    AREA HEALED RIGHT FOOT  . GERD (gastroesophageal reflux disease)   . Headache(784.0)    occasional sinus headache   . History of blood transfusion JULY 2015  . Hypertension   . Hypoparathyroidism ([place]) 01/07/2011  . Hypothyroidism   . Kidney stone 2015/10/18   passed on their own  . Morbid obesity ([place])   . Nephrolithiasis 2/16, 9/16   SEES DR Risa Grill  . Pernicious anemia 02/20/2014   followed by Debbrah Alar  . PONV (postoperative nausea and vomiting)   . Seizures ([place])    infancy secondary to fever  . Thyroid cancer ([place]) 2011   THYROIDECTOMY DONE  . Uterine cancer ([place]) 2014   Mirena IUD  . Vitamin D deficiency    Past Surgical  History:  Procedure Laterality Date  . AMPUTATION Right 05/18/2014   Procedure: RIGHT FIFTH RAY AMPUTATION FOOT;  Surgeon: Wylene Simmer, MD;  Location: [facility];  Service: Orthopedics;  Laterality: Right;  . APPENDECTOMY    . BUNIONECTOMY     bilateral  . COLONOSCOPY WITH PROPOFOL N/A 02/02/2014   Procedure: COLONOSCOPY WITH PROPOFOL;  Surgeon: Irene Shipper, MD;  Location: [facility];  Service: Endoscopy;  Laterality: N/A;  . cyst on ovary removed     . CYSTOSCOPY W/ RETROGRADES  03/03/2012   Procedure: CYSTOSCOPY WITH RETROGRADE PYELOGRAM;  Surgeon: Bernestine Amass, MD;  Location: [facility];  Service: Urology;  Laterality: Bilateral;  . CYSTOSCOPY WITH RETROGRADE PYELOGRAM, URETEROSCOPY AND STENT PLACEMENT Left 08/25/2012   Procedure: CYSTOSCOPY WITH RETROGRADE PYELOGRAM, URETEROSCOPY;  Surgeon: Bernestine Amass, MD;  Location: [facility];  Service: Urology;  Laterality: Left;  . DILATION AND CURETTAGE OF UTERUS N/A 02/21/2013   Procedure: DILATATION AND CURETTAGE;  Surgeon: Lyman Speller, MD;  Location: [facility];  Service: Gynecology;  Laterality: N/A;  . DILATION AND CURETTAGE OF UTERUS N/A 04/09/2015   Procedure: [place] IUD removal;  Surgeon: Megan Salon, MD;  Location: [facility];  Service: Gynecology;  Laterality:  N/A;  Patient weight 307lbs  . ESOPHAGOGASTRODUODENOSCOPY (EGD) WITH PROPOFOL N/A 02/02/2014   Procedure: ESOPHAGOGASTRODUODENOSCOPY (EGD) WITH PROPOFOL;  Surgeon: Irene Shipper, MD;  [facility]: [facility];  Service: Endoscopy;  Laterality: N/A;  . GASTRIC BYPASS  1974  . HOLMIUM LASER APPLICATION Left 07/04/9240   Procedure: HOLMIUM LASER APPLICATION;  Surgeon: Bernestine Amass, MD;  Location: [facility];  Service: Urology;  Laterality: Left;  . LITHOTRIPSY  03/2012  . LITHOTRIPSY  2/16  . THYROIDECTOMY  05/15/2010  . TONSILLECTOMY AND ADENOIDECTOMY    . URETEROSCOPY  03/03/2012   Procedure: URETEROSCOPY;  Surgeon: Bernestine Amass, MD;  Location: [facility];  Service: Urology;   Laterality: Left;    Current Outpatient Medications:  .  acetaminophen (TYLENOL) 325 MG tablet, Take 2 tablets (650 mg total) by mouth every 6 (six) hours as needed for mild pain or moderate pain., Disp: 60 tablet, Rfl: 0 .  albuterol (PROVENTIL HFA;VENTOLIN HFA) 108 (90 Base) MCG/ACT inhaler, Inhale 2 puffs into the lungs every 6 (six) hours as needed for wheezing or shortness of breath., Disp: 1 Inhaler, Rfl: 5 .  apixaban (ELIQUIS) 5 MG TABS tablet, Take 1 tablet (5 mg total) by mouth daily. (Patient taking differently: Take 5 mg by mouth 2 (two) times daily. ), Disp: , Rfl:  .  calcitRIOL (ROCALTROL) 0.5 MCG capsule, Take 1.5 mcg by mouth daily. , Disp: , Rfl:  .  calcium carbonate (TUMS - DOSED IN MG ELEMENTAL CALCIUM) 500 MG chewable tablet, Chew 1 tablet (200 mg of elemental calcium total) by mouth 2 (two) times daily with a meal., Disp: , Rfl:  .  citric acid-potassium citrate (POLYCITRA) 1100-334 MG/5ML solution, Take 10 mEq by mouth daily., Disp: , Rfl:  .  Cyanocobalamin (VITAMIN B-12 IJ), Inject as directed every 30 (thirty) days., Disp: , Rfl:  .  desoximetasone (TOPICORT) 0.05 % cream, Apply topically 2 (two) times daily as needed., Disp: 60 g, Rfl: 0 .  diclofenac sodium (VOLTAREN) 1 % GEL, Apply topically 2 (two) times daily as needed., Disp: , Rfl:  .  fluticasone (FLONASE) 50 MCG/ACT nasal spray, Place 2 sprays into both nostrils daily as needed for allergies or rhinitis. , Disp: , Rfl:  .  levocetirizine (XYZAL) 5 MG tablet, TAKE 1 TABLET BY MOUTH  EVERY EVENING, Disp: 90 tablet, Rfl: 3 .  levothyroxine (SYNTHROID, LEVOTHROID) 100 MCG tablet, Take 300 mcg by mouth daily. , Disp: , Rfl:  .  losartan (COZAAR) 50 MG tablet, TAKE 1 TABLET(50 MG) BY MOUTH DAILY, Disp: 90 tablet, Rfl: 1 .  neomycin-bacitracin-polymyxin (NEOSPORIN) ointment, Apply 1 application topically as needed for wound care. apply to eye, Disp: , Rfl:  .  nystatin (NYSTATIN) powder, APPLY TOPICALLY TO THE AFFECTED  AREA TWICE DAILY, Disp: 60 g, Rfl: 5 .  omeprazole (PRILOSEC) 40 MG capsule, TAKE 1 CAPSULE BY MOUTH  DAILY, Disp: 90 capsule, Rfl: 1 .  SYMBICORT 160-4.5 MCG/ACT inhaler, INHALE 2 PUFFS TWO TIMES  DAILY, Disp: 30.6 g, Rfl: 1 .  traMADol (ULTRAM) 50 MG tablet, Take 2 tablets (100 mg total) every 8 (eight) hours as needed by mouth for moderate pain., Disp: 30 tablet, Rfl: 0 .  Vitamin D, Ergocalciferol, (DRISDOL) 50000 units CAPS capsule, TAKE 1 CAPSULE BY MOUTH 3  TIMES WEEKLY (Patient taking differently: TAKE 1 CAPSULE BY MOUTH 4 TIMES WEEKLY), Disp: 33 capsule, Rfl: 5 .  zafirlukast (ACCOLATE) 20 MG tablet, TAKE 1 TABLET BY MOUTH 2  TIMES DAILY BEFORE MEALS,  Disp: 180 tablet, Rfl: 1 .  diltiazem (CARDIZEM CD) 240 MG 24 hr capsule, Take 1 capsule (240 mg total) by mouth daily., Disp: 90 capsule, Rfl: 0 .  furosemide (LASIX) 40 MG tablet, TAKE 1 TABLET BY MOUTH IN MORNING AND 1/2 TABLET BY MOUTH IN EVENING, Disp: 45 tablet, Rfl: 0  Allergies  Allergen Reactions  . Other Anaphylaxis    NUTS  . Penicillins Other (See Comments)    Headache. Has patient had a PCN reaction causing immediate rash, facial/tongue/throat swelling, SOB or lightheadedness with hypotension: No Has patient had a PCN reaction causing severe rash involving mucus membranes or skin necrosis: No Has patient had a PCN reaction that required hospitalization: No Has patient had a PCN reaction occurring within the last 10 years: Yes If all of the above answers are "NO", then may proceed with Cephalosporin use.   [name] Amoxicillin-Pot Clavulanate Other (See Comments)    headache  . Food Hives    Potato  . Tomato Hives   Review of Systems Objective:   Vitals:   05/15/17 1514  BP: 117/68  Pulse: 77  Resp: 16   General AA&O x3. Normal mood and affect.  Vascular Dorsalis pedis and posterior tibial pulses  present 2+ bilaterally  Capillary refill normal to all digits. Pedal hair growth normal.  Neurologic Epicritic sensation  grossly present.  Dermatologic No open lesions. Interspaces clear of maceration. Nails well groomed and normal in appearance.  Orthopedic: MMT 5/5 in dorsiflexion, plantarflexion, inversion, and eversion. Normal joint ROM without pain or crepitus. Hallux varus bilaterally with lateral deviation of the digits and hammertoe formation.  Evidence of prior resection of the fifth metatarsal right foot and amputation of fifth toe.   Radiographs left foot hallux varus with lateral deviation of the lesser digits right foot hallux varus with history of amputation lateral ray.  Tissue anchor present in the left hallux proximal phalanx pin present to the left second metatarsal.  Soft tissue edema noted. Assessment & Plan:  Patient was evaluated and treated and all questions answered.  Hallux varus bilaterally -X-rays taken and reviewed -Discussed the patient the possible surgical treatments for the severe deformities she has to her feet.  Offered amputation as a possible option for quick return to normal shoe gear.  Patient does not wish to pursue this route.  Discussed that she could benefit from left foot first MPJ fusion with pan metatarsal head resection and hammertoe corrections.  On the right foot she could benefit from right first MPJ fusion.  Patient unable to pursue surgical correction at this time and wishes to present back for further discussion at a later date.  Will have outpatient follow-up in 6 weeks for further surgical consideration  Return in about 6 weeks (around 06/26/2017).

## 2017-05-18 ENCOUNTER — Other Ambulatory Visit: Payer: Self-pay | Admitting: Family

## 2017-05-22 ENCOUNTER — Other Ambulatory Visit: Payer: Self-pay | Admitting: Rheumatology

## 2017-05-22 ENCOUNTER — Ambulatory Visit (INDEPENDENT_AMBULATORY_CARE_PROVIDER_SITE_OTHER): Payer: 59

## 2017-05-22 ENCOUNTER — Other Ambulatory Visit: Payer: Self-pay | Admitting: Family

## 2017-05-22 VITALS — Wt 307.2 lb

## 2017-05-22 DIAGNOSIS — E538 Deficiency of other specified B group vitamins: Secondary | ICD-10-CM | POA: Diagnosis not present

## 2017-05-22 MED ORDER — CYANOCOBALAMIN 1000 MCG/ML IJ SOLN
1000.0000 ug | Freq: Once | INTRAMUSCULAR | Status: AC
  Administered 2017-05-22: 1000 ug via INTRAMUSCULAR

## 2017-05-22 NOTE — Progress Notes (Signed)
Pre visit review using our clinic tool,if applicable. No additional management support is needed unless otherwise documented below in the visit note.   Patient in for B12 injection per order from M. Inda Castle NP due to patient having B12 deficiency.  1000 mcg given left deltoid. Patient tolerated well.   Return appointment given to patient for next injection. 06/19/16 @ 9:00 am

## 2017-05-25 ENCOUNTER — Ambulatory Visit: Payer: 59 | Admitting: Family

## 2017-05-29 ENCOUNTER — Other Ambulatory Visit: Payer: Self-pay | Admitting: Podiatry

## 2017-05-29 ENCOUNTER — Encounter: Payer: Self-pay | Admitting: Family

## 2017-05-29 ENCOUNTER — Ambulatory Visit: Payer: 59 | Admitting: Family

## 2017-05-29 VITALS — BP 108/72 | HR 85 | Temp 98.1°F | Resp 16 | Ht 64.0 in | Wt 307.0 lb

## 2017-05-29 DIAGNOSIS — E559 Vitamin D deficiency, unspecified: Secondary | ICD-10-CM | POA: Diagnosis not present

## 2017-05-29 DIAGNOSIS — R002 Palpitations: Secondary | ICD-10-CM

## 2017-05-29 DIAGNOSIS — I5032 Chronic diastolic (congestive) heart failure: Secondary | ICD-10-CM | POA: Diagnosis not present

## 2017-05-29 DIAGNOSIS — M2042 Other hammer toe(s) (acquired), left foot: Principal | ICD-10-CM

## 2017-05-29 DIAGNOSIS — M2041 Other hammer toe(s) (acquired), right foot: Secondary | ICD-10-CM

## 2017-05-29 LAB — CBC WITH DIFFERENTIAL/PLATELET
BASOS ABS: 0.1 10*3/uL (ref 0.0–0.1)
Basophils Relative: 0.7 % (ref 0.0–3.0)
Eosinophils Absolute: 0.2 10*3/uL (ref 0.0–0.7)
Eosinophils Relative: 1.8 % (ref 0.0–5.0)
HEMATOCRIT: 44.7 % (ref 36.0–46.0)
Hemoglobin: 14.5 g/dL (ref 12.0–15.0)
LYMPHS PCT: 14.5 % (ref 12.0–46.0)
Lymphs Abs: 1.4 10*3/uL (ref 0.7–4.0)
MCHC: 32.5 g/dL (ref 30.0–36.0)
MCV: 86.8 fl (ref 78.0–100.0)
MONOS PCT: 4.4 % (ref 3.0–12.0)
Monocytes Absolute: 0.4 10*3/uL (ref 0.1–1.0)
NEUTROS PCT: 78.6 % — AB (ref 43.0–77.0)
Neutro Abs: 7.8 10*3/uL — ABNORMAL HIGH (ref 1.4–7.7)
Platelets: 127 10*3/uL — ABNORMAL LOW (ref 150.0–400.0)
RBC: 5.15 Mil/uL — ABNORMAL HIGH (ref 3.87–5.11)
RDW: 16.7 % — ABNORMAL HIGH (ref 11.5–15.5)
WBC: 9.9 10*3/uL (ref 4.0–10.5)

## 2017-05-29 LAB — COMPREHENSIVE METABOLIC PANEL
ALBUMIN: 4 g/dL (ref 3.5–5.2)
ALT: 24 U/L (ref 0–35)
AST: 16 U/L (ref 0–37)
Alkaline Phosphatase: 104 U/L (ref 39–117)
BUN: 20 mg/dL (ref 6–23)
CALCIUM: 8.3 mg/dL — AB (ref 8.4–10.5)
CHLORIDE: 98 meq/L (ref 96–112)
CO2: 30 mEq/L (ref 19–32)
Creatinine, Ser: 0.79 mg/dL (ref 0.40–1.20)
GFR: 78.89 mL/min (ref >60.00)
Glucose, Bld: 86 mg/dL (ref 70–99)
POTASSIUM: 3.7 meq/L (ref 3.5–5.1)
Sodium: 138 mEq/L (ref 135–145)
Total Bilirubin: 0.5 mg/dL (ref 0.2–1.2)
Total Protein: 7.6 g/dL (ref 6.0–8.3)

## 2017-05-29 LAB — TSH: TSH: 9.85 u[IU]/mL — AB (ref 0.35–4.50)

## 2017-05-29 LAB — VITAMIN D 25 HYDROXY (VIT D DEFICIENCY, FRACTURES): VITD: 35.88 ng/mL (ref 30.00–100.00)

## 2017-05-29 NOTE — Patient Instructions (Signed)
Please complete lab work prior to leaving.   

## 2017-05-29 NOTE — Progress Notes (Signed)
Subjective:    Patient ID: [name], female    DOB: 12-Oct-1955, 62 y.o.   MRN: [id number]  HPI  Patient is a 61 yr old female who presents today for follow up. She continues lasix 40mg  in AM and 20mg  in PM.  Weight has remained stable.  Notes sob in the evenings.  Feels like swelling is the same as last visit.    Wt Readings from Last 3 Encounters:  05/29/17 (!) 307 lb (139.3 kg)  05/22/17 (!) 307 lb 3.2 oz (139.3 kg)  04/08/17 (!) 307 lb 9.6 oz (139.5 kg)   Hypothyroid- managed by endo.  Lab Results  Component Value Date   TSH 1.394 12/09/2016   Vit D deficiency- maintained on vit D supplement.   Has had some palpitations.   Review of Systems    see HPI  Past Medical History:  Diagnosis Date  . Abnormal Pap smear    years ago/no biopsy  . Allergic rhinitis   . Anemia, iron deficiency   . Arthritis    back- lower  . Asthma   . Atrial fibrillation ([place]) August 2012   OFF XARELTO LAST MONTH DUE TO BLEEDING IN STOOL  . Bursitis of hip left  . Dysrhythmia    afib, followed by Dr. Stanford Breed   . Edema of both legs   . Fatty liver 01/07/2012  . Fibroid 1974   fibroid cyst on left fallopian tube  . Fibromyalgia   . Foot ulcer ([place])    AREA HEALED RIGHT FOOT  . GERD (gastroesophageal reflux disease)   . Headache(784.0)    occasional sinus headache   . History of blood transfusion JULY 2015  . Hypertension   . Hypoparathyroidism ([place]) 01/07/2011  . Hypothyroidism   . Kidney stone 07-Oct-2015   passed on their own  . Morbid obesity ([place])   . Nephrolithiasis 2/16, 9/16   SEES DR Risa Grill  . Pernicious anemia 02/20/2014   followed by Debbrah Alar  . PONV (postoperative nausea and vomiting)   . Seizures ([place])    infancy secondary to fever  . Thyroid cancer ([place]) 2011   THYROIDECTOMY DONE  . Uterine cancer ([place]) 2014   Mirena IUD  . Vitamin D deficiency      Social History   Socioeconomic History  . Marital status: Single    Spouse name: Not on file    . Number of children: 0  . Years of education: Not on file  . Highest education level: Not on file  Social Needs  . Financial resource strain: Not on file  . Food insecurity - worry: Not on file  . Food insecurity - inability: Not on file  . Transportation needs - medical: Not on file  . Transportation needs - non-medical: Not on file  Occupational History  . Occupation: works in Insurance claims handler  . Occupation: DESIGN COMPUTER CHIP    Employer: [employer]  Tobacco Use  . Smoking status: Former Smoker    Packs/day: 0.50    Years: 25.00    Pack years: 12.50    Types: Cigarettes    Start date: 12/24/1970    Last attempt to quit: 05/27/1995    Years since quitting: 22.0  . Smokeless tobacco: Never Used  . Tobacco comment: quit smoking 19 years ago  Substance and Sexual Activity  . Alcohol use: Yes    Alcohol/week: 0.0 oz    Comment: 1/2 glass per month  . Drug use: No  . Sexual activity:  Yes    Partners: Male    Birth control/protection: Post-menopausal  Other Topics Concern  . Not on file  Social History Narrative   Occupation: works in Insurance claims handler - Field seismologist   Single       Former Smoker - quit tobacco 12 years ago.  She was light smoker for 10 years.                 Past Surgical History:  Procedure Laterality Date  . AMPUTATION Right 05/18/2014   Procedure: RIGHT FIFTH RAY AMPUTATION FOOT;  Surgeon: Wylene Simmer, MD;  Location: [facility];  Service: Orthopedics;  Laterality: Right;  . APPENDECTOMY    . BUNIONECTOMY     bilateral  . COLONOSCOPY WITH PROPOFOL N/A 02/02/2014   Procedure: COLONOSCOPY WITH PROPOFOL;  Surgeon: Irene Shipper, MD;  [facility];  Service: Endoscopy;  Laterality: N/A;  . cyst on ovary removed     . CYSTOSCOPY W/ RETROGRADES  03/03/2012   Procedure: CYSTOSCOPY WITH RETROGRADE PYELOGRAM;  Surgeon: Bernestine Amass, MD;  [facility];  Service: Urology;  Laterality: Bilateral;  . CYSTOSCOPY WITH RETROGRADE  PYELOGRAM, URETEROSCOPY AND STENT PLACEMENT Left 08/25/2012   Procedure: CYSTOSCOPY WITH RETROGRADE PYELOGRAM, URETEROSCOPY;  Surgeon: Bernestine Amass, MD;  [facility];  Service: Urology;  Laterality: Left;  . DILATION AND CURETTAGE OF UTERUS N/A 02/21/2013   Procedure: DILATATION AND CURETTAGE;  Surgeon: Lyman Speller, MD;  Location: [facility];  Service: Gynecology;  Laterality: N/A;  . DILATION AND CURETTAGE OF UTERUS N/A 04/09/2015   Procedure: [place] IUD removal;  Surgeon: Megan Salon, MD;  Location: [facility];  Service: Gynecology;  Laterality: N/A;  Patient weight 307lbs  . ESOPHAGOGASTRODUODENOSCOPY (EGD) WITH PROPOFOL N/A 02/02/2014   Procedure: ESOPHAGOGASTRODUODENOSCOPY (EGD) WITH PROPOFOL;  Surgeon: Irene Shipper, MD;  [facility];  Service: Endoscopy;  Laterality: N/A;  . GASTRIC BYPASS  1974  . HOLMIUM LASER APPLICATION Left 08/26/5954   Procedure: HOLMIUM LASER APPLICATION;  Surgeon: Bernestine Amass, MD;  [facility];  Service: Urology;  Laterality: Left;  . LITHOTRIPSY  03/2012  . LITHOTRIPSY  2/16  . THYROIDECTOMY  05/15/2010  . TONSILLECTOMY AND ADENOIDECTOMY    . URETEROSCOPY  03/03/2012   Procedure: URETEROSCOPY;  Surgeon: Bernestine Amass, MD;  [facility];  Service: Urology;  Laterality: Left;    Family History  Problem Relation Age of Onset  . Hypertension Father   . Diabetes Father   . Lung cancer Father   . Heart attack Father        MI at age 61  . Hypertension Mother   . Hyperthyroidism Mother   . Heart disease Mother   . Heart attack Mother        MI at age 74  . Asthma Brother   . Hypertension Brother        younger  . Heart disease Brother        older  . Colon cancer Neg Hx   . Esophageal cancer Neg Hx   . Stomach cancer Neg Hx   . Kidney disease Neg Hx   . Liver disease Neg Hx     Allergies  Allergen Reactions  . Other Anaphylaxis    NUTS  . Penicillins Other (See Comments)    Headache. Has patient  had a PCN reaction causing immediate rash, facial/tongue/throat swelling, SOB or lightheadedness with hypotension: No Has patient had a PCN reaction  causing severe rash involving mucus membranes or skin necrosis: No Has patient had a PCN reaction that required hospitalization: No Has patient had a PCN reaction occurring within the last 10 years: Yes If all of the above answers are "NO", then may proceed with Cephalosporin use.   [name] Amoxicillin-Pot Clavulanate Other (See Comments)    headache  . Food Hives    Potato  . Tomato Hives    Current Outpatient Medications on File Prior to Visit  Medication Sig Dispense Refill  . acetaminophen (TYLENOL) 325 MG tablet Take 2 tablets (650 mg total) by mouth every 6 (six) hours as needed for mild pain or moderate pain. 60 tablet 0  . albuterol (PROVENTIL HFA;VENTOLIN HFA) 108 (90 Base) MCG/ACT inhaler Inhale 2 puffs into the lungs every 6 (six) hours as needed for wheezing or shortness of breath. 1 Inhaler 5  . apixaban (ELIQUIS) 5 MG TABS tablet Take 1 tablet (5 mg total) by mouth daily. (Patient taking differently: Take 5 mg by mouth 2 (two) times daily. )    . calcitRIOL (ROCALTROL) 0.5 MCG capsule Take 1.5 mcg by mouth daily.     . calcium carbonate (TUMS - DOSED IN MG ELEMENTAL CALCIUM) 500 MG chewable tablet Chew 1 tablet (200 mg of elemental calcium total) by mouth 2 (two) times daily with a meal.    . citric acid-potassium citrate (POLYCITRA) 1100-334 MG/5ML solution Take 10 mEq by mouth daily.    . Cyanocobalamin (VITAMIN B-12 IJ) Inject as directed every 30 (thirty) days.    [name] desoximetasone (TOPICORT) 0.05 % cream Apply topically 2 (two) times daily as needed. 60 g 0  . diltiazem (CARDIZEM CD) 240 MG 24 hr capsule Take 1 capsule (240 mg total) by mouth daily. 90 capsule 0  . fluticasone (FLONASE) 50 MCG/ACT nasal spray Place 2 sprays into both nostrils daily as needed for allergies or rhinitis.     . furosemide (LASIX) 40 MG tablet TAKE 1  TABLET BY MOUTH IN MORNING AND 1/2 TABLET BY MOUTH IN EVENING 45 tablet 0  . levocetirizine (XYZAL) 5 MG tablet TAKE 1 TABLET BY MOUTH  EVERY EVENING 90 tablet 3  . levothyroxine (SYNTHROID, LEVOTHROID) 100 MCG tablet Take 300 mcg by mouth daily.     [name] losartan (COZAAR) 50 MG tablet TAKE 1 TABLET(50 MG) BY MOUTH DAILY 90 tablet 1  . neomycin-bacitracin-polymyxin (NEOSPORIN) ointment Apply 1 application topically as needed for wound care. apply to eye    . nystatin (NYSTATIN) powder APPLY TOPICALLY TO THE AFFECTED AREA TWICE DAILY 60 g 5  . omeprazole (PRILOSEC) 40 MG capsule TAKE 1 CAPSULE BY MOUTH  DAILY 90 capsule 1  . SYMBICORT 160-4.5 MCG/ACT inhaler INHALE 2 PUFFS TWO TIMES  DAILY 30.6 g 1  . traMADol (ULTRAM) 50 MG tablet Take 2 tablets (100 mg total) every 8 (eight) hours as needed by mouth for moderate pain. 30 tablet 0  . Vitamin D, Ergocalciferol, (DRISDOL) 50000 units CAPS capsule TAKE 1 CAPSULE BY MOUTH 3  TIMES WEEKLY (Patient taking differently: TAKE 1 CAPSULE BY MOUTH 4 TIMES WEEKLY) 33 capsule 5  . zafirlukast (ACCOLATE) 20 MG tablet TAKE 1 TABLET BY MOUTH 2  TIMES DAILY BEFORE MEALS 180 tablet 1   No current facility-administered medications on file prior to visit.     BP 108/72 (BP Location: Left Arm, Patient Position: Sitting, Cuff Size: Large)   Pulse 85   Temp 98.1 F (36.7 C) (Oral)   Resp 16   Ht  5\' 4"  (1.626 m)   Wt (!) 307 lb (139.3 kg)   LMP 06/11/2012   SpO2 98%   BMI 52.70 kg/m    Objective:   Physical Exam  Constitutional: She appears well-developed and well-nourished.  Cardiovascular: Normal rate, regular rhythm and normal heart sounds.  No murmur heard. Pulmonary/Chest: Effort normal and breath sounds normal. No respiratory distress. She has no wheezes.  Musculoskeletal:  2+ bilateral LE edema.   Psychiatric: She has a normal mood and affect. Her behavior is normal. Judgment and thought content normal.          Assessment & Plan:   Palpitations- EKG today, CMET, TSH. EKG tracing is personally reviewed.  EKG notes NSR.  No acute changes.   Diastolic CHF- Clinically stable. Continue current dose of lasix. Discussed use of OTC knee highs for LE edema.  Continue to monitor weight at home.    Vit D deficiency- check vit D level, continue current supplement.

## 2017-06-01 ENCOUNTER — Telehealth: Payer: Self-pay | Admitting: Family

## 2017-06-01 ENCOUNTER — Other Ambulatory Visit: Payer: Self-pay | Admitting: Family

## 2017-06-01 DIAGNOSIS — D696 Thrombocytopenia, unspecified: Secondary | ICD-10-CM

## 2017-06-01 DIAGNOSIS — M79673 Pain in unspecified foot: Secondary | ICD-10-CM

## 2017-06-01 DIAGNOSIS — Z79899 Other long term (current) drug therapy: Secondary | ICD-10-CM

## 2017-06-01 NOTE — Telephone Encounter (Signed)
See result note.  

## 2017-06-03 ENCOUNTER — Telehealth: Payer: Self-pay | Admitting: *Deleted

## 2017-06-03 NOTE — Telephone Encounter (Signed)
Patient is in recall. Patient needs AEX /PAP. Please contact patient for scheduling  Thanks

## 2017-06-04 NOTE — Telephone Encounter (Signed)
Message left to return call to Rever Pichette at [phone].    

## 2017-06-09 NOTE — Telephone Encounter (Signed)
Detailed message left per DPR for patient to return call to schedule aex/pap smear.

## 2017-06-11 NOTE — Telephone Encounter (Signed)
Patient has not returned call x2. Please advise.

## 2017-06-15 ENCOUNTER — Encounter: Payer: Self-pay | Admitting: Family

## 2017-06-15 ENCOUNTER — Telehealth: Payer: Self-pay | Admitting: Medical

## 2017-06-15 MED ORDER — TRAMADOL HCL 50 MG PO TABS: 100.0000 mg | ORAL_TABLET | Freq: Three times a day (TID) | ORAL | 0 refills | Status: DC | PRN

## 2017-06-15 NOTE — Addendum Note (Signed)
Addended by: Kelle Darting A on: 06/15/2017 05:29 PM   Modules accepted: Orders

## 2017-06-15 NOTE — Telephone Encounter (Signed)
See secondary phone note from 06/15/17.

## 2017-06-15 NOTE — Telephone Encounter (Signed)
Please advise in PCP's absence?  Last tramadol RX: 04/01/17, #30 Last OV: 05/29/17 Next OV: 08/28/17 UDS: 12/2016 CSC: Not on file CSR: No discrepancies identified

## 2017-06-15 NOTE — Telephone Encounter (Signed)
Patient has not returned call regarding 08 recall. Please advise on recall status/ letter  Thanks

## 2017-06-15 NOTE — Addendum Note (Signed)
Addended by: Kelle Darting A on: 06/15/2017 05:41 PM   Modules accepted: Orders

## 2017-06-15 NOTE — Telephone Encounter (Signed)
Yes, last Rx was 04/01/17, #30. (See previous phone note from 06/15/17). Pt currently on 50mg . Rx printed for current strength and placed on covering Provider ledge for signature.

## 2017-06-15 NOTE — Telephone Encounter (Signed)
Patient wants me to refill his tramadol.  Looks like he has had UDS in the past.  Is he on contract?  You know his source of pain?  Am I correct that his last prescription refill was in November?  Would you go ahead and send me his prescription to stop on tramadol 80 mg #30 fatigue  Sig: 2 tab po every 8 hours prn pain.  Last time tramadol filled November?

## 2017-06-15 NOTE — Telephone Encounter (Signed)
Rx faxed to [facility]. Mychart message sent to pt.

## 2017-06-16 NOTE — Telephone Encounter (Signed)
Please write letter.  I will need to edit it and it will need to be sent certified mail.  Thanks.

## 2017-06-16 NOTE — Telephone Encounter (Signed)
Sending to nursing supervisor Gay Filler, RN

## 2017-06-19 ENCOUNTER — Ambulatory Visit (INDEPENDENT_AMBULATORY_CARE_PROVIDER_SITE_OTHER): Payer: 59

## 2017-06-19 ENCOUNTER — Other Ambulatory Visit (INDEPENDENT_AMBULATORY_CARE_PROVIDER_SITE_OTHER): Payer: 59

## 2017-06-19 ENCOUNTER — Encounter: Payer: Self-pay | Admitting: *Deleted

## 2017-06-19 DIAGNOSIS — Z79899 Other long term (current) drug therapy: Secondary | ICD-10-CM | POA: Diagnosis not present

## 2017-06-19 DIAGNOSIS — M79673 Pain in unspecified foot: Secondary | ICD-10-CM

## 2017-06-19 DIAGNOSIS — E538 Deficiency of other specified B group vitamins: Secondary | ICD-10-CM

## 2017-06-19 DIAGNOSIS — D696 Thrombocytopenia, unspecified: Secondary | ICD-10-CM

## 2017-06-19 LAB — CBC WITH DIFFERENTIAL/PLATELET
BASOS ABS: 0.1 10*3/uL (ref 0.0–0.1)
Basophils Relative: 0.6 % (ref 0.0–3.0)
Eosinophils Absolute: 0.2 10*3/uL (ref 0.0–0.7)
Eosinophils Relative: 1.4 % (ref 0.0–5.0)
HCT: 41.8 % (ref 36.0–46.0)
HEMOGLOBIN: 13.6 g/dL (ref 12.0–15.0)
LYMPHS PCT: 13.8 % (ref 12.0–46.0)
Lymphs Abs: 1.6 10*3/uL (ref 0.7–4.0)
MCHC: 32.4 g/dL (ref 30.0–36.0)
MCV: 87.5 fl (ref 78.0–100.0)
MONO ABS: 0.6 10*3/uL (ref 0.1–1.0)
NEUTROS PCT: 79.1 % — AB (ref 43.0–77.0)
Neutro Abs: 9.3 10*3/uL — ABNORMAL HIGH (ref 1.4–7.7)
Platelets: 120 10*3/uL — ABNORMAL LOW (ref 150.0–400.0)
RBC: 4.78 Mil/uL (ref 3.87–5.11)
RDW: 15.5 % (ref 11.5–15.5)
WBC: 11.8 10*3/uL — ABNORMAL HIGH (ref 4.0–10.5)

## 2017-06-19 MED ORDER — CYANOCOBALAMIN 1000 MCG/ML IJ SOLN
1000.0000 ug | Freq: Once | INTRAMUSCULAR | Status: AC
  Administered 2017-06-19: 1000 ug via INTRAMUSCULAR

## 2017-06-19 NOTE — Progress Notes (Signed)
Pre visit review using our clinic tool,if applicable. No additional management support is needed unless otherwise documented below in the visit note.   Patient in for B12 injection per order from M. O'Sullivan,NP due to patient having B12 deficiency.   Given 1000 mcg IM right deltoid. Patient tolerated well.  No com[plaints voiced this visit.  Return appointment scheduled for 1 month.

## 2017-06-19 NOTE — Progress Notes (Signed)
Reviewed

## 2017-06-19 NOTE — Progress Notes (Signed)
Controlled substance Contract printed, signed and given to lab.

## 2017-06-20 LAB — PAIN MGMT, PROFILE 8 W/CONF, U
6 ACETYLMORPHINE: NEGATIVE ng/mL (ref <10)
ALCOHOL METABOLITES: NEGATIVE ng/mL (ref <500)
Amphetamines: NEGATIVE ng/mL (ref <500)
BUPRENORPHINE, URINE: NEGATIVE ng/mL (ref <5)
Benzodiazepines: NEGATIVE ng/mL (ref <100)
COCAINE METABOLITE: NEGATIVE ng/mL (ref <150)
CREATININE: 41.8 mg/dL
MDMA: NEGATIVE ng/mL (ref <500)
Marijuana Metabolite: NEGATIVE ng/mL (ref <20)
OPIATES: NEGATIVE ng/mL (ref <100)
OXIDANT: NEGATIVE ug/mL (ref <200)
OXYCODONE: NEGATIVE ng/mL (ref <100)
PH: 6.92 (ref 4.5–9.0)

## 2017-06-22 ENCOUNTER — Telehealth: Payer: Self-pay | Admitting: *Deleted

## 2017-06-22 NOTE — Telephone Encounter (Signed)
Mychart response sent to pt notifying her of below.

## 2017-06-22 NOTE — Progress Notes (Signed)
HPI: FU atrial fibrillation. Myoview July 2012 showed EF 64% mild ischemia in the inferior wall. We elected to treat medically. Patient was also having palpitations and near syncopal episodes. A CardioNet revealed sinus rhythm with PACs, brief runs of PAT and brief runs of PAF. MRA in September of 2014 showed no aneurysm. Xarelto DCed previously due to GI bleed; WU showed esophagitis; anticoagulation resumed. Echo 11/18 showed normal LV function, grade 1 DD. Since last seen,  patient denies dyspnea, chest pain, palpitations or syncope.  She has chronic pedal edema.  She has not had any bleeding.  Current Outpatient Medications  Medication Sig Dispense Refill  . acetaminophen (TYLENOL) 325 MG tablet Take 2 tablets (650 mg total) by mouth every 6 (six) hours as needed for mild pain or moderate pain. 60 tablet 0  . albuterol (PROVENTIL HFA;VENTOLIN HFA) 108 (90 Base) MCG/ACT inhaler Inhale 2 puffs into the lungs every 6 (six) hours as needed for wheezing or shortness of breath. 1 Inhaler 5  . apixaban (ELIQUIS) 5 MG TABS tablet Take 1 tablet (5 mg total) by mouth daily. (Patient taking differently: Take 5 mg by mouth 2 (two) times daily. )    . calcitRIOL (ROCALTROL) 0.5 MCG capsule Take 1.5 mcg by mouth daily.     . calcium carbonate (TUMS - DOSED IN MG ELEMENTAL CALCIUM) 500 MG chewable tablet Chew 1 tablet (200 mg of elemental calcium total) by mouth 2 (two) times daily with a meal.    . citric acid-potassium citrate (POLYCITRA) 1100-334 MG/5ML solution Take 10 mEq by mouth daily.    . Cyanocobalamin (VITAMIN B-12 IJ) Inject as directed every 30 (thirty) days.    [name] desoximetasone (TOPICORT) 0.05 % cream Apply topically 2 (two) times daily as needed. 60 g 0  . diltiazem (CARDIZEM CD) 240 MG 24 hr capsule Take 1 capsule (240 mg total) by mouth daily. 90 capsule 0  . fluticasone (FLONASE) 50 MCG/ACT nasal spray Place 2 sprays into both nostrils daily as needed for allergies or rhinitis.     .  furosemide (LASIX) 40 MG tablet TAKE 1 TABLET BY MOUTH IN MORNING AND 1/2 TABLET BY MOUTH IN EVENING 45 tablet 0  . levocetirizine (XYZAL) 5 MG tablet TAKE 1 TABLET BY MOUTH  EVERY EVENING 90 tablet 3  . levothyroxine (SYNTHROID, LEVOTHROID) 100 MCG tablet Take 300 mcg by mouth daily.     [name] losartan (COZAAR) 50 MG tablet TAKE 1 TABLET(50 MG) BY MOUTH DAILY 90 tablet 1  . neomycin-bacitracin-polymyxin (NEOSPORIN) ointment Apply 1 application topically as needed for wound care. apply to eye    . omeprazole (PRILOSEC) 40 MG capsule TAKE 1 CAPSULE BY MOUTH  DAILY 90 capsule 1  . SYMBICORT 160-4.5 MCG/ACT inhaler INHALE 2 PUFFS TWO TIMES  DAILY 30.6 g 1  . traMADol (ULTRAM) 50 MG tablet Take 2 tablets (100 mg total) by mouth every 8 (eight) hours as needed for moderate pain. 30 tablet 0  . Vitamin D, Ergocalciferol, (DRISDOL) 50000 units CAPS capsule TAKE 1 CAPSULE BY MOUTH 3  TIMES WEEKLY (Patient taking differently: TAKE 1 CAPSULE BY MOUTH 4 TIMES WEEKLY) 33 capsule 5  . zafirlukast (ACCOLATE) 20 MG tablet TAKE 1 TABLET BY MOUTH 2  TIMES DAILY BEFORE MEALS 180 tablet 1   No current facility-administered medications for this visit.      Past Medical History:  Diagnosis Date  . Abnormal Pap smear    years ago/no biopsy  . Allergic rhinitis   .  Anemia, iron deficiency   . Arthritis    back- lower  . Asthma   . Atrial fibrillation ([place]) August 2012   OFF XARELTO LAST MONTH DUE TO BLEEDING IN STOOL  . Bursitis of hip left  . Dysrhythmia    afib, followed by Dr. Stanford Breed   . Edema of both legs   . Fatty liver 01/07/2012  . Fibroid 1974   fibroid cyst on left fallopian tube  . Fibromyalgia   . Foot ulcer ([place])    AREA HEALED RIGHT FOOT  . GERD (gastroesophageal reflux disease)   . Headache(784.0)    occasional sinus headache   . History of blood transfusion JULY 2015  . Hypertension   . Hypoparathyroidism ([place]) 01/07/2011  . Hypothyroidism   . Kidney stone 10-05-2015   passed on their own   . Morbid obesity ([place])   . Nephrolithiasis 2/16, 9/16   SEES DR Risa Grill  . Pernicious anemia 02/20/2014   followed by Debbrah Alar  . PONV (postoperative nausea and vomiting)   . Seizures ([place])    infancy secondary to fever  . Thyroid cancer ([place]) 2011   THYROIDECTOMY DONE  . Uterine cancer ([place]) 2014   Mirena IUD  . Vitamin D deficiency     Past Surgical History:  Procedure Laterality Date  . AMPUTATION Right 05/18/2014   Procedure: RIGHT FIFTH RAY AMPUTATION FOOT;  Surgeon: Wylene Simmer, MD;  Location: [facility];  Service: Orthopedics;  Laterality: Right;  . APPENDECTOMY    . BUNIONECTOMY     bilateral  . COLONOSCOPY WITH PROPOFOL N/A 02/02/2014   Procedure: COLONOSCOPY WITH PROPOFOL;  Surgeon: Irene Shipper, MD;  [facility];  Service: Endoscopy;  Laterality: N/A;  . cyst on ovary removed     . CYSTOSCOPY W/ RETROGRADES  03/03/2012   Procedure: CYSTOSCOPY WITH RETROGRADE PYELOGRAM;  Surgeon: Bernestine Amass, MD;  [facility];  Service: Urology;  Laterality: Bilateral;  . CYSTOSCOPY WITH RETROGRADE PYELOGRAM, URETEROSCOPY AND STENT PLACEMENT Left 08/25/2012   Procedure: CYSTOSCOPY WITH RETROGRADE PYELOGRAM, URETEROSCOPY;  Surgeon: Bernestine Amass, MD;  [facility];  Service: Urology;  Laterality: Left;  . DILATION AND CURETTAGE OF UTERUS N/A 02/21/2013   Procedure: DILATATION AND CURETTAGE;  Surgeon: Lyman Speller, MD;  Location: [facility];  Service: Gynecology;  Laterality: N/A;  . DILATION AND CURETTAGE OF UTERUS N/A 04/09/2015   Procedure: [place] IUD removal;  Surgeon: Megan Salon, MD;  Location: [facility];  Service: Gynecology;  Laterality: N/A;  Patient weight 307lbs  . ESOPHAGOGASTRODUODENOSCOPY (EGD) WITH PROPOFOL N/A 02/02/2014   Procedure: ESOPHAGOGASTRODUODENOSCOPY (EGD) WITH PROPOFOL;  Surgeon: Irene Shipper, MD;  [facility];  Service: Endoscopy;  Laterality: N/A;  . GASTRIC BYPASS  1974  . HOLMIUM LASER APPLICATION Left  12/28/1658   Procedure: HOLMIUM LASER APPLICATION;  Surgeon: Bernestine Amass, MD;  [facility];  Service: Urology;  Laterality: Left;  . LITHOTRIPSY  03/2012  . LITHOTRIPSY  2/16  . THYROIDECTOMY  05/15/2010  . TONSILLECTOMY AND ADENOIDECTOMY    . URETEROSCOPY  03/03/2012   Procedure: URETEROSCOPY;  Surgeon: Bernestine Amass, MD;  [facility];  Service: Urology;  Laterality: Left;    Social History   Socioeconomic History  . Marital status: Single    Spouse name: Not on file  . Number of children: 0  . Years of education: Not on file  . Highest education level: Not on file  Social Needs  .  Financial resource strain: Not on file  . Food insecurity - worry: Not on file  . Food insecurity - inability: Not on file  . Transportation needs - medical: Not on file  . Transportation needs - non-medical: Not on file  Occupational History  . Occupation: works in Insurance claims handler  . Occupation: DESIGN COMPUTER CHIP    Employer: [employer]  Tobacco Use  . Smoking status: Former Smoker    Packs/day: 0.50    Years: 25.00    Pack years: 12.50    Types: Cigarettes    Start date: 12/24/1970    Last attempt to quit: 05/27/1995    Years since quitting: 22.1  . Smokeless tobacco: Never Used  . Tobacco comment: quit smoking 19 years ago  Substance and Sexual Activity  . Alcohol use: Yes    Alcohol/week: 0.0 oz    Comment: 1/2 glass per month  . Drug use: No  . Sexual activity: Yes    Partners: Male    Birth control/protection: Post-menopausal  Other Topics Concern  . Not on file  Social History Narrative   Occupation: works in Insurance claims handler - Field seismologist   Single       Former Smoker - quit tobacco 12 years ago.  She was light smoker for 10 years.                 Family History  Problem Relation Age of Onset  . Hypertension Father   . Diabetes Father   . Lung cancer Father   . Heart attack Father        MI at age 38  . Hypertension Mother   .  Hyperthyroidism Mother   . Heart disease Mother   . Heart attack Mother        MI at age 68  . Asthma Brother   . Hypertension Brother        younger  . Heart disease Brother        older  . Colon cancer Neg Hx   . Esophageal cancer Neg Hx   . Stomach cancer Neg Hx   . Kidney disease Neg Hx   . Liver disease Neg Hx     ROS: Arthralgias and foot pain but  no fevers or chills, productive cough, hemoptysis, dysphasia, odynophagia, melena, hematochezia, dysuria, hematuria, rash, seizure activity, orthopnea, PND, pedal edema, claudication. Remaining systems are negative.  Physical Exam: Well-developed obese in no acute distress.  Skin is warm and dry.  HEENT is normal.  Neck is supple.  Chest is clear to auscultation with normal expansion.  Cardiovascular exam is regular rate and rhythm.  Abdominal exam nontender or distended. No masses palpated. Extremities show 1+ edema. neuro grossly intact   A/P  1 paroxysmal atrial fibrillation-patient is in sinus rhythm on examination today.  Continue Cardizem for rate control if atrial fibrillation recurs.  Continue apixaban 5 mg twice daily.  2 hypertension-blood pressure controlled.  Continue present medications.  3 edema-continue present dose of diuretics.  4 morbid obesity-patient counseled on weight loss.  Kirk Ruths, MD

## 2017-06-22 NOTE — Telephone Encounter (Signed)
Pt seen in the office on Friday, 06/19/17. Upon completion of labs and UDS I asked pt to sign a controlled substance contract and she declined stating she would take it home, read over it and let us know.  Sent mychart message to pt checking status and asking if she has any further questions.

## 2017-06-22 NOTE — Telephone Encounter (Signed)
Per our controlled substance contract for all patients she will need to sign prior to additional refills. This is signed once annually.

## 2017-06-25 ENCOUNTER — Encounter: Payer: Self-pay | Admitting: Family

## 2017-06-26 NOTE — Telephone Encounter (Signed)
Message has been sent to the lab to check on CBC result from 06/19/17 as it currently pulls up UDS results under the CBC. Awaiting response.

## 2017-06-28 ENCOUNTER — Telehealth: Payer: Self-pay | Admitting: Family

## 2017-06-28 DIAGNOSIS — D696 Thrombocytopenia, unspecified: Secondary | ICD-10-CM

## 2017-06-28 NOTE — Telephone Encounter (Signed)
Please let pt know that platelet count remains low. I would like to recommend that she see hematology. Referral has been pended.

## 2017-06-29 NOTE — Telephone Encounter (Signed)
It looks like all labs have posted to mychart and have been viewed by patient.

## 2017-06-29 NOTE — Telephone Encounter (Signed)
Upon review of Epic, previous referral was for anemia. This is a new diagnosis and referral has been signed. Pt may still contact his office to schedule on her own.

## 2017-06-29 NOTE — Telephone Encounter (Signed)
Patient states she does not need referral she already sees Dr. Marin Olp and states she will just go see him.

## 2017-06-29 NOTE — Telephone Encounter (Signed)
[name]-- please review CBC result and advise?

## 2017-07-01 ENCOUNTER — Encounter: Payer: Self-pay | Admitting: Cardiology

## 2017-07-01 ENCOUNTER — Ambulatory Visit: Payer: 59 | Admitting: Cardiology

## 2017-07-01 VITALS — BP 122/77 | HR 81 | Ht 64.0 in | Wt 310.1 lb

## 2017-07-01 DIAGNOSIS — I10 Essential (primary) hypertension: Secondary | ICD-10-CM | POA: Diagnosis not present

## 2017-07-01 DIAGNOSIS — I480 Paroxysmal atrial fibrillation: Secondary | ICD-10-CM | POA: Diagnosis not present

## 2017-07-01 NOTE — Patient Instructions (Signed)
Your physician wants you to follow-up in: ONE YEAR WITH DR CRENSHAW You will receive a reminder letter in the mail two months in advance. If you don't receive a letter, please call our office to schedule the follow-up appointment.   If you need a refill on your cardiac medications before your next appointment, please call your pharmacy.  

## 2017-07-02 ENCOUNTER — Other Ambulatory Visit: Payer: Self-pay | Admitting: Family

## 2017-07-03 ENCOUNTER — Other Ambulatory Visit: Payer: 59

## 2017-07-10 ENCOUNTER — Ambulatory Visit: Payer: 59 | Admitting: Family

## 2017-07-17 ENCOUNTER — Ambulatory Visit (INDEPENDENT_AMBULATORY_CARE_PROVIDER_SITE_OTHER): Payer: 59 | Admitting: *Deleted

## 2017-07-17 DIAGNOSIS — D510 Vitamin B12 deficiency anemia due to intrinsic factor deficiency: Secondary | ICD-10-CM

## 2017-07-17 MED ORDER — CYANOCOBALAMIN 1000 MCG/ML IJ SOLN
1000.0000 ug | Freq: Once | INTRAMUSCULAR | Status: AC
  Administered 2017-07-17: 1000 ug via INTRAMUSCULAR

## 2017-07-17 NOTE — Progress Notes (Signed)
Pre visit review using our clinic review tool, if applicable. No additional management support is needed unless otherwise documented below in the visit note.  Pt here for monthly B12 injection per order of PCP, Debbrah Alar, NP. 1079mcg given IM right deltoid and pt tolerated procedure well.  Lab Results  Component Value Date   VITAMINB12 >1500 (H) 03/19/2016    Next B12 injection scheduled for 08/14/17 at 9:15am.

## 2017-07-19 NOTE — Progress Notes (Signed)
Note reviewed.  Debbrah Alar NP

## 2017-07-27 ENCOUNTER — Encounter: Payer: Self-pay | Admitting: Hematology & Oncology

## 2017-07-27 ENCOUNTER — Inpatient Hospital Stay (HOSPITAL_BASED_OUTPATIENT_CLINIC_OR_DEPARTMENT_OTHER): Payer: 59 | Admitting: Hematology & Oncology

## 2017-07-27 ENCOUNTER — Other Ambulatory Visit: Payer: Self-pay

## 2017-07-27 ENCOUNTER — Inpatient Hospital Stay: Payer: 59 | Attending: Hematology & Oncology

## 2017-07-27 VITALS — BP 94/51 | HR 85 | Temp 98.6°F | Resp 18 | Wt 315.0 lb

## 2017-07-27 DIAGNOSIS — D500 Iron deficiency anemia secondary to blood loss (chronic): Secondary | ICD-10-CM

## 2017-07-27 DIAGNOSIS — D509 Iron deficiency anemia, unspecified: Secondary | ICD-10-CM

## 2017-07-27 DIAGNOSIS — I4891 Unspecified atrial fibrillation: Secondary | ICD-10-CM | POA: Diagnosis not present

## 2017-07-27 DIAGNOSIS — D510 Vitamin B12 deficiency anemia due to intrinsic factor deficiency: Secondary | ICD-10-CM | POA: Diagnosis not present

## 2017-07-27 DIAGNOSIS — K912 Postsurgical malabsorption, not elsewhere classified: Secondary | ICD-10-CM

## 2017-07-27 DIAGNOSIS — Z7901 Long term (current) use of anticoagulants: Secondary | ICD-10-CM | POA: Insufficient documentation

## 2017-07-27 DIAGNOSIS — Z79899 Other long term (current) drug therapy: Secondary | ICD-10-CM

## 2017-07-27 DIAGNOSIS — Z9884 Bariatric surgery status: Secondary | ICD-10-CM | POA: Diagnosis not present

## 2017-07-27 LAB — CBC WITH DIFFERENTIAL (CANCER CENTER ONLY)
BASOS ABS: 0 10*3/uL (ref 0.0–0.1)
Basophils Relative: 0 %
EOS ABS: 0.2 10*3/uL (ref 0.0–0.5)
EOS PCT: 1 %
HCT: 43.6 % (ref 34.8–46.6)
Hemoglobin: 14.2 g/dL (ref 11.6–15.9)
LYMPHS PCT: 11 %
Lymphs Abs: 1.5 10*3/uL (ref 0.9–3.3)
MCH: 29.3 pg (ref 26.0–34.0)
MCHC: 32.6 g/dL (ref 32.0–36.0)
MCV: 90.1 fL (ref 81.0–101.0)
Monocytes Absolute: 0.7 10*3/uL (ref 0.1–0.9)
Monocytes Relative: 5 %
Neutro Abs: 11.7 10*3/uL — ABNORMAL HIGH (ref 1.5–6.5)
Neutrophils Relative %: 83 %
Platelet Count: ADEQUATE 10*3/uL (ref 145–400)
RBC: 4.84 MIL/uL (ref 3.70–5.32)
RDW: 13.8 % (ref 11.1–15.7)
WBC: 14 10*3/uL — AB (ref 3.9–10.0)

## 2017-07-27 LAB — CMP (CANCER CENTER ONLY)
ALT: 28 U/L (ref 10–47)
ANION GAP: 12 (ref 5–15)
AST: 21 U/L (ref 11–38)
Albumin: 3.6 g/dL (ref 3.5–5.0)
Alkaline Phosphatase: 114 U/L — ABNORMAL HIGH (ref 26–84)
BILIRUBIN TOTAL: 0.7 mg/dL (ref 0.2–1.6)
BUN: 13 mg/dL (ref 7–22)
CO2: 31 mmol/L (ref 18–33)
Calcium: 8.2 mg/dL (ref 8.0–10.3)
Chloride: 102 mmol/L (ref 98–108)
Creatinine: 0.9 mg/dL (ref 0.60–1.20)
GLUCOSE: 93 mg/dL (ref 73–118)
Potassium: 4.4 mmol/L (ref 3.3–4.7)
Sodium: 145 mmol/L (ref 128–145)
Total Protein: 7.7 g/dL (ref 6.4–8.1)

## 2017-07-27 NOTE — Progress Notes (Signed)
Hematology and Oncology Follow Up Visit  [name] [id number] 05/25/1956 62 y.o. 07/27/2017   Principle Diagnosis:  Iron deficiency anemia Pernicious anemia Gastric bypass with malabsorption  Current Therapy:   IV iron as indicated - last received in November 2018 Vitamin B-12 1 mg IM every month - with PCP   Interim History:  [name] is here today for follow-up.  Unfortunately, she is having more problems with her big toe on the right foot.  It sounds like she is going to need to have this amputated.  It just is not getting better.  She still has a splint that she walks with.  She is seen to foot doctors.  She just is not sure what to do.  She has been on antibiotics.  Otherwise, she is doing okay.  She does her vitamin B12 with her family doctor-Dr. Debbrah Alar.  We last saw her back in November, her ferritin was 1100 but iron saturation was only 17%.  She got a dose of iron at that time.  She got through the holidays without any difficulty.  She has a rolling walker that helps her ambulate.  She is had no fever.  She is had no rashes.  Peripheral blood pressure is quite low.  She has had a little bit of dizziness.  I told her to stop the losartan.  She is on diltiazem and furosemide.  I think stopping the losartan would be reasonable.  She is on Eliquis for atrial fibrillation.  Thankfully, she is had no bleeding.  She does have some bruising.  Overall, I said that her performance status is ECOG 2.  Medications:  Allergies as of 07/27/2017      Reactions   Other Anaphylaxis   NUTS   Penicillins Other (See Comments)   Headache. Has patient had a PCN reaction causing immediate rash, facial/tongue/throat swelling, SOB or lightheadedness with hypotension: No Has patient had a PCN reaction causing severe rash involving mucus membranes or skin necrosis: No Has patient had a PCN reaction that required hospitalization: No Has patient had a PCN reaction occurring  within the last 10 years: Yes If all of the above answers are "NO", then may proceed with Cephalosporin use.   Amoxicillin-pot Clavulanate Other (See Comments)   headache   Food Hives   Potato   Tomato Hives      Medication List        Accurate as of 07/27/17  3:53 PM. Always use your most recent med list.          acetaminophen 325 MG tablet Commonly known as:  TYLENOL Take 2 tablets (650 mg total) by mouth every 6 (six) hours as needed for mild pain or moderate pain.   albuterol 108 (90 Base) MCG/ACT inhaler Commonly known as:  PROVENTIL HFA;VENTOLIN HFA Inhale 2 puffs into the lungs every 6 (six) hours as needed for wheezing or shortness of breath.   apixaban 5 MG Tabs tablet Commonly known as:  ELIQUIS Take 1 tablet (5 mg total) by mouth daily.   calcitRIOL 0.5 MCG capsule Commonly known as:  ROCALTROL Take 1.5 mcg by mouth daily.   calcium carbonate 500 MG chewable tablet Commonly known as:  TUMS - dosed in mg elemental calcium Chew 1 tablet (200 mg of elemental calcium total) by mouth 2 (two) times daily with a meal.   citric acid-potassium citrate 1100-334 MG/5ML solution Commonly known as:  POLYCITRA Take 10 mEq by mouth daily.   desoximetasone 0.05 %  cream Commonly known as:  TOPICORT Apply topically 2 (two) times daily as needed.   diltiazem 240 MG 24 hr capsule Commonly known as:  CARDIZEM CD Take 1 capsule (240 mg total) by mouth daily.   fluticasone 50 MCG/ACT nasal spray Commonly known as:  FLONASE Place 2 sprays into both nostrils daily as needed for allergies or rhinitis.   furosemide 40 MG tablet Commonly known as:  LASIX TAKE 1 TABLET BY MOUTH IN MORNING AND 1/2 TABLET BY MOUTH IN EVENING   levocetirizine 5 MG tablet Commonly known as:  XYZAL TAKE 1 TABLET BY MOUTH  EVERY EVENING   levothyroxine 100 MCG tablet Commonly known as:  SYNTHROID, LEVOTHROID Take 300 mcg by mouth daily.   loratadine 10 MG tablet Commonly known as:   CLARITIN TK 1 T PO ONCE A DAY   losartan 50 MG tablet Commonly known as:  COZAAR TAKE 1 TABLET BY MOUTH  DAILY   neomycin-bacitracin-polymyxin ointment Commonly known as:  NEOSPORIN Apply 1 application topically as needed for wound care. apply to eye   omeprazole 40 MG capsule Commonly known as:  PRILOSEC TAKE 1 CAPSULE BY MOUTH  DAILY   SYMBICORT 160-4.5 MCG/ACT inhaler Generic drug:  budesonide-formoterol INHALE 2 PUFFS TWO TIMES  DAILY   traMADol 50 MG tablet Commonly known as:  ULTRAM Take 2 tablets (100 mg total) by mouth every 8 (eight) hours as needed for moderate pain.   VITAMIN B-12 IJ Inject as directed every 30 (thirty) days.   Vitamin D (Ergocalciferol) 50000 units Caps capsule Commonly known as:  DRISDOL TAKE 1 CAPSULE BY MOUTH 3  TIMES WEEKLY   zafirlukast 20 MG tablet Commonly known as:  ACCOLATE TAKE 1 TABLET BY MOUTH 2  TIMES DAILY BEFORE MEALS       Allergies:  Allergies  Allergen Reactions  . Other Anaphylaxis    NUTS  . Penicillins Other (See Comments)    Headache. Has patient had a PCN reaction causing immediate rash, facial/tongue/throat swelling, SOB or lightheadedness with hypotension: No Has patient had a PCN reaction causing severe rash involving mucus membranes or skin necrosis: No Has patient had a PCN reaction that required hospitalization: No Has patient had a PCN reaction occurring within the last 10 years: Yes If all of the above answers are "NO", then may proceed with Cephalosporin use.   [name] Amoxicillin-Pot Clavulanate Other (See Comments)    headache  . Food Hives    Potato  . Tomato Hives    Past Medical History, Surgical history, Social history, and Family History were reviewed and updated.  Review of Systems: Review of Systems  Constitutional: Negative.   HENT: Negative.   Eyes: Negative.   Respiratory: Negative.   Cardiovascular: Negative.   Gastrointestinal: Negative.   Genitourinary: Negative.   Musculoskeletal:  Positive for joint pain.  Skin: Negative.   Neurological: Negative.   Endo/Heme/Allergies: Bruises/bleeds easily.  Psychiatric/Behavioral: Negative.      Physical Exam:  weight is 315 lb (142.9 kg) (abnormal). Her oral temperature is 98.6 F (37 C). Her blood pressure is 94/51 (abnormal) and her pulse is 85. Her respiration is 18 and oxygen saturation is 96%.   Wt Readings from Last 3 Encounters:  07/27/17 (!) 315 lb (142.9 kg)  07/01/17 (!) 310 lb 1.9 oz (140.7 kg)  05/29/17 (!) 307 lb (139.3 kg)    Physical Exam  Constitutional: She is oriented to person, place, and time.  HENT:  Head: Normocephalic and atraumatic.  Mouth/Throat: Oropharynx is  clear and moist.  Eyes: EOM are normal. Pupils are equal, round, and reactive to light.  Neck: Normal range of motion.  Cardiovascular: Normal rate, regular rhythm and normal heart sounds.  Pulmonary/Chest: Effort normal and breath sounds normal.  Abdominal: Soft. Bowel sounds are normal.  Musculoskeletal: Normal range of motion. She exhibits no edema, tenderness or deformity.  Lymphadenopathy:    She has no cervical adenopathy.  Neurological: She is alert and oriented to person, place, and time.  Skin: Skin is warm and dry. No rash noted. No erythema.  Psychiatric: She has a normal mood and affect. Her behavior is normal. Judgment and thought content normal.  Vitals reviewed.   Lab Results  Component Value Date   WBC 14.0 (H) 07/27/2017   HGB 13.6 06/19/2017   HCT 43.6 07/27/2017   MCV 90.1 07/27/2017   PLT  07/27/2017    PLATELET CLUMPS NOTED ON SMEAR, COUNT APPEARS ADEQUATE   Lab Results  Component Value Date   FERRITIN 1,134 (H) 03/30/2017   IRON 47 03/30/2017   TIBC 274 03/30/2017   UIBC 227 03/30/2017   IRONPCTSAT 17 (L) 03/30/2017   Lab Results  Component Value Date   RETICCTPCT 3.1 (H) 03/30/2015   RBC 4.84 07/27/2017   RETICCTABS 131.4 03/30/2015   No results found for: Nils Pyle  [facility] Lab Results  Component Value Date   IGGSERUM 1250 06/09/2007   IGA 334 06/09/2007   IGMSERUM 244 06/09/2007   Lab Results  Component Value Date   TOTALPROTELP 7.0 06/09/2007     Chemistry      Component Value Date/Time   NA 145 07/27/2017 1451   NA 144 03/30/2017 1455   NA 139 07/30/2015 1302   K 4.4 07/27/2017 1451   K 3.7 03/30/2017 1455   K 3.5 07/30/2015 1302   CL 102 07/27/2017 1451   CL 99 03/30/2017 1455   CO2 31 07/27/2017 1451   CO2 30 03/30/2017 1455   CO2 25 07/30/2015 1302   BUN 13 07/27/2017 1451   BUN 14 03/30/2017 1455   BUN 16.8 07/30/2015 1302   CREATININE 0.90 07/27/2017 1451   CREATININE 0.8 03/30/2017 1455   CREATININE 0.8 07/30/2015 1302      Component Value Date/Time   CALCIUM 8.2 07/27/2017 1451   CALCIUM 8.7 03/30/2017 1455   CALCIUM 8.8 07/30/2015 1302   ALKPHOS 114 (H) 07/27/2017 1451   ALKPHOS 107 (H) 03/30/2017 1455   ALKPHOS 99 07/30/2015 1302   AST 21 07/27/2017 1451   AST 36 (H) 07/30/2015 1302   ALT 28 07/27/2017 1451   ALT 28 03/30/2017 1455   ALT 68 (H) 07/30/2015 1302   BILITOT 0.7 07/27/2017 1451   BILITOT 0.41 07/30/2015 1302      Impression and Plan: [name] is a very pleasant 62 yo caucasian female with iron deficiency anemia secondary to malabsorption after gastric bypass. She also has pernicious anemia and receives monthly B 12 injections with her PCP.   I just feel bad that she is going to need to have her big toe taken off.  It sounds like she has osteomyelitis.  I do not see any problems with her having this surgery.  She is on Eliquis so we will have to make sure she is off Eliquis for a couple days beforehand.  For right now, I would like to see her back in about 2 months.  I think we have to stay on top of this situation and monitor her blood  counts closely.  Volanda Napoleon, MD 3/4/20193:53 PM

## 2017-07-28 LAB — IRON AND TIBC
IRON: 38 ug/dL — AB (ref 41–142)
Saturation Ratios: 14 % — ABNORMAL LOW (ref 21–57)
TIBC: 265 ug/dL (ref 236–444)
UIBC: 227 ug/dL

## 2017-07-28 LAB — RETICULOCYTES
RBC.: 4.75 MIL/uL (ref 3.70–5.45)
RETIC CT PCT: 2.2 % — AB (ref 0.7–2.1)
Retic Count, Absolute: 104.5 10*3/uL — ABNORMAL HIGH (ref 33.7–90.7)

## 2017-07-28 LAB — FERRITIN: FERRITIN: 1358 ng/mL — AB (ref 9–269)

## 2017-07-29 ENCOUNTER — Other Ambulatory Visit: Payer: Self-pay | Admitting: Family

## 2017-07-29 ENCOUNTER — Other Ambulatory Visit: Payer: Self-pay | Admitting: Cardiology

## 2017-07-29 ENCOUNTER — Encounter: Payer: Self-pay | Admitting: *Deleted

## 2017-07-29 NOTE — Telephone Encounter (Signed)
Follow-up call to patient regarding annual exam. Left message to call back.   Letter to your office for review/edit. Will mail if no response from patient.

## 2017-07-30 NOTE — Telephone Encounter (Signed)
Removed from recall-eh

## 2017-07-30 NOTE — Telephone Encounter (Signed)
Please mail this letter as certified mail.

## 2017-07-30 NOTE — Telephone Encounter (Signed)
Letter mailed certified and regular [place] mail.  Encounter closed.   CC: Starlyn Skeans, LPN

## 2017-08-13 ENCOUNTER — Ambulatory Visit: Payer: 59 | Admitting: Podiatry

## 2017-08-14 ENCOUNTER — Telehealth: Payer: Self-pay | Admitting: Family

## 2017-08-14 ENCOUNTER — Ambulatory Visit (INDEPENDENT_AMBULATORY_CARE_PROVIDER_SITE_OTHER): Payer: 59 | Admitting: *Deleted

## 2017-08-14 ENCOUNTER — Other Ambulatory Visit (INDEPENDENT_AMBULATORY_CARE_PROVIDER_SITE_OTHER): Payer: 59

## 2017-08-14 VITALS — Wt 320.0 lb

## 2017-08-14 DIAGNOSIS — D510 Vitamin B12 deficiency anemia due to intrinsic factor deficiency: Secondary | ICD-10-CM | POA: Diagnosis not present

## 2017-08-14 DIAGNOSIS — R609 Edema, unspecified: Secondary | ICD-10-CM

## 2017-08-14 LAB — BASIC METABOLIC PANEL
BUN: 18 mg/dL (ref 6–23)
CALCIUM: 8 mg/dL — AB (ref 8.4–10.5)
CO2: 31 meq/L (ref 19–32)
Chloride: 98 mEq/L (ref 96–112)
Creatinine, Ser: 0.76 mg/dL (ref 0.40–1.20)
GFR: 82.44 mL/min (ref >60.00)
Glucose, Bld: 93 mg/dL (ref 70–99)
Potassium: 3.9 mEq/L (ref 3.5–5.1)
Sodium: 141 mEq/L (ref 135–145)

## 2017-08-14 MED ORDER — CYANOCOBALAMIN 1000 MCG/ML IJ SOLN
1000.0000 ug | Freq: Once | INTRAMUSCULAR | Status: AC
  Administered 2017-08-14: 1000 ug via INTRAMUSCULAR

## 2017-08-14 NOTE — Progress Notes (Signed)
Reviewed and agree.  Athea Haley [name] NP 

## 2017-08-14 NOTE — Progress Notes (Signed)
Pre visit review using our clinic review tool, if applicable. No additional management support is needed unless otherwise documented below in the visit note.  Pt here for B12 injection per Debbrah Alar, NP  B12 1000 mcg given IM, left deltoid and pt tolerated injection well.  Next B12 injection scheduled for 09/15/17 at 3:45pm.  Pt states she has been retaining fluid; swelling in feet and legs. Weight is up 13 pounds since seen in January. She reports slight redness in legs by the end of the day and denies shortness of breath.  Per verbal from PCP, ok to increase Furosemide to 1 tablet twice a day and check bmet today. Scheduled pt f/u for 08/20/17 at 8:40 am with PCP.  Pt voices understanding of instructions.

## 2017-08-20 ENCOUNTER — Telehealth: Payer: Self-pay | Admitting: Family

## 2017-08-20 ENCOUNTER — Encounter: Payer: Self-pay | Admitting: Family

## 2017-08-20 ENCOUNTER — Ambulatory Visit: Payer: 59 | Admitting: Family

## 2017-08-20 ENCOUNTER — Other Ambulatory Visit: Payer: Self-pay | Admitting: Family

## 2017-08-20 VITALS — BP 110/64 | HR 75 | Temp 98.3°F | Resp 16 | Ht 64.0 in | Wt 323.2 lb

## 2017-08-20 DIAGNOSIS — I10 Essential (primary) hypertension: Secondary | ICD-10-CM | POA: Diagnosis not present

## 2017-08-20 LAB — BASIC METABOLIC PANEL
BUN: 19 mg/dL (ref 6–23)
CALCIUM: 7.5 mg/dL — AB (ref 8.4–10.5)
CO2: 31 meq/L (ref 19–32)
Chloride: 99 mEq/L (ref 96–112)
Creatinine, Ser: 0.72 mg/dL (ref 0.40–1.20)
GFR: 87.74 mL/min (ref >60.00)
GLUCOSE: 94 mg/dL (ref 70–99)
Potassium: 3.7 mEq/L (ref 3.5–5.1)
SODIUM: 139 meq/L (ref 135–145)

## 2017-08-20 MED ORDER — CALCIUM CARBONATE ANTACID 500 MG PO CHEW: 1.0000 | CHEWABLE_TABLET | Freq: Three times a day (TID) | ORAL | Status: DC

## 2017-08-20 MED ORDER — FUROSEMIDE 40 MG PO TABS: ORAL_TABLET | ORAL | 1 refills | Status: DC

## 2017-08-20 NOTE — Telephone Encounter (Signed)
Patient would like bills resubmit from B12  to insurance, per bills recd from [facility]. Patient did not have bills with her at the time if more info is needed please call   [phone].

## 2017-08-20 NOTE — Progress Notes (Signed)
Subjective:    Patient ID: [name], female    DOB: 12/10/1955, 62 y.o.   MRN: [id number]  HPI  [name] is a 62 yr old female who presents today with chief complaint of LE edema. Her  lasix dosing is 40mg  in AM and 20mg  in PM.  We increased to 40mg  bid 1 week ago. She reports some wheezing which does not seem to be relieved by use of albuterol.   Wt Readings from Last 3 Encounters:  08/20/17 (!) 323 lb 3.2 oz (146.6 kg)  08/14/17 (!) 320 lb (145.2 kg)  07/27/17 (!) 315 lb (142.9 kg)    Back in January her weight was 307.  Review of Systems See HPI  Past Medical History:  Diagnosis Date  . Abnormal Pap smear    years ago/no biopsy  . Allergic rhinitis   . Anemia, iron deficiency   . Arthritis    back- lower  . Asthma   . Atrial fibrillation ([place]) August 2012   OFF XARELTO LAST MONTH DUE TO BLEEDING IN STOOL  . Bursitis of hip left  . Dysrhythmia    afib, followed by Dr. Stanford Breed   . Edema of both legs   . Fatty liver 01/07/2012  . Fibroid 1974   fibroid cyst on left fallopian tube  . Fibromyalgia   . Foot ulcer ([place])    AREA HEALED RIGHT FOOT  . GERD (gastroesophageal reflux disease)   . Headache(784.0)    occasional sinus headache   . History of blood transfusion JULY 2015  . Hypertension   . Hypoparathyroidism ([place]) 01/07/2011  . Hypothyroidism   . Kidney stone 2015-10-08   passed on their own  . Morbid obesity ([place])   . Nephrolithiasis 2/16, 9/16   SEES DR Risa Grill  . Pernicious anemia 02/20/2014   followed by Debbrah Alar  . PONV (postoperative nausea and vomiting)   . Seizures ([place])    infancy secondary to fever  . Thyroid cancer ([place]) 2011   THYROIDECTOMY DONE  . Uterine cancer ([place]) 2014   Mirena IUD  . Vitamin D deficiency      Social History   Socioeconomic History  . Marital status: Single    Spouse name: Not on file  . Number of children: 0  . Years of education: Not on file  . Highest education level: Not on file    Occupational History  . Occupation: works in Insurance claims handler  . Occupation: DESIGN COMPUTER CHIP    Employer: [employer]  Social Needs  . Financial resource strain: Not on file  . Food insecurity:    Worry: Not on file    Inability: Not on file  . Transportation needs:    Medical: Not on file    Non-medical: Not on file  Tobacco Use  . Smoking status: Former Smoker    Packs/day: 0.50    Years: 25.00    Pack years: 12.50    Types: Cigarettes    Start date: 12/24/1970    Last attempt to quit: 05/27/1995    Years since quitting: 22.2  . Smokeless tobacco: Never Used  . Tobacco comment: quit smoking 19 years ago  Substance and Sexual Activity  . Alcohol use: Yes    Alcohol/week: 0.0 oz    Comment: 1/2 glass per month  . Drug use: No  . Sexual activity: Yes    Partners: Male    Birth control/protection: Post-menopausal  Lifestyle  . Physical activity:    Days  per week: Not on file    Minutes per session: Not on file  . Stress: Not on file  Relationships  . Social connections:    Talks on phone: Not on file    Gets together: Not on file    Attends religious service: Not on file    Active member of club or organization: Not on file    Attends meetings of clubs or organizations: Not on file    Relationship status: Not on file  . Intimate partner violence:    Fear of current or ex partner: Not on file    Emotionally abused: Not on file    Physically abused: Not on file    Forced sexual activity: Not on file  Other Topics Concern  . Not on file  Social History Narrative   Occupation: works in Insurance claims handler - Field seismologist   Single       Former Smoker - quit tobacco 12 years ago.  She was light smoker for 10 years.                 Past Surgical History:  Procedure Laterality Date  . AMPUTATION Right 05/18/2014   Procedure: RIGHT FIFTH RAY AMPUTATION FOOT;  Surgeon: Wylene Simmer, MD;  Location: [facility];  Service: Orthopedics;  Laterality: Right;  .  APPENDECTOMY    . BUNIONECTOMY     bilateral  . COLONOSCOPY WITH PROPOFOL N/A 02/02/2014   Procedure: COLONOSCOPY WITH PROPOFOL;  Surgeon: Irene Shipper, MD;  Location: [facility];  Service: Endoscopy;  Laterality: N/A;  . cyst on ovary removed     . CYSTOSCOPY W/ RETROGRADES  03/03/2012   Procedure: CYSTOSCOPY WITH RETROGRADE PYELOGRAM;  Surgeon: Bernestine Amass, MD;  Location: [facility];  Service: Urology;  Laterality: Bilateral;  . CYSTOSCOPY WITH RETROGRADE PYELOGRAM, URETEROSCOPY AND STENT PLACEMENT Left 08/25/2012   Procedure: CYSTOSCOPY WITH RETROGRADE PYELOGRAM, URETEROSCOPY;  Surgeon: Bernestine Amass, MD;  Location: [facility];  Service: Urology;  Laterality: Left;  . DILATION AND CURETTAGE OF UTERUS N/A 02/21/2013   Procedure: DILATATION AND CURETTAGE;  Surgeon: Lyman Speller, MD;  Location: [facility];  Service: Gynecology;  Laterality: N/A;  . DILATION AND CURETTAGE OF UTERUS N/A 04/09/2015   Procedure: [place] IUD removal;  Surgeon: Megan Salon, MD;  Location: [facility];  Service: Gynecology;  Laterality: N/A;  Patient weight 307lbs  . ESOPHAGOGASTRODUODENOSCOPY (EGD) WITH PROPOFOL N/A 02/02/2014   Procedure: ESOPHAGOGASTRODUODENOSCOPY (EGD) WITH PROPOFOL;  Surgeon: Irene Shipper, MD;  [facility];  Service: Endoscopy;  Laterality: N/A;  . GASTRIC BYPASS  1974  . HOLMIUM LASER APPLICATION Left 07/30/1060   Procedure: HOLMIUM LASER APPLICATION;  Surgeon: Bernestine Amass, MD;  Location: [facility];  Service: Urology;  Laterality: Left;  . LITHOTRIPSY  03/2012  . LITHOTRIPSY  2/16  . THYROIDECTOMY  05/15/2010  . TONSILLECTOMY AND ADENOIDECTOMY    . URETEROSCOPY  03/03/2012   Procedure: URETEROSCOPY;  Surgeon: Bernestine Amass, MD;  Location: [facility];  Service: Urology;  Laterality: Left;    Family History  Problem Relation Age of Onset  . Hypertension Father   . Diabetes Father   . Lung cancer Father   . Heart attack Father        MI at age 35  . Hypertension Mother    . Hyperthyroidism Mother   . Heart disease Mother   . Heart attack Mother        MI at age  70  . Asthma Brother   . Hypertension Brother        younger  . Heart disease Brother        older  . Colon cancer Neg Hx   . Esophageal cancer Neg Hx   . Stomach cancer Neg Hx   . Kidney disease Neg Hx   . Liver disease Neg Hx     Allergies  Allergen Reactions  . Other Anaphylaxis    NUTS  . Penicillins Other (See Comments)    Headache. Has patient had a PCN reaction causing immediate rash, facial/tongue/throat swelling, SOB or lightheadedness with hypotension: No Has patient had a PCN reaction causing severe rash involving mucus membranes or skin necrosis: No Has patient had a PCN reaction that required hospitalization: No Has patient had a PCN reaction occurring within the last 10 years: Yes If all of the above answers are "NO", then may proceed with Cephalosporin use.   [name] Amoxicillin-Pot Clavulanate Other (See Comments)    headache  . Food Hives    Potato  . Tomato Hives    Current Outpatient Medications on File Prior to Visit  Medication Sig Dispense Refill  . acetaminophen (TYLENOL) 325 MG tablet Take 2 tablets (650 mg total) by mouth every 6 (six) hours as needed for mild pain or moderate pain. 60 tablet 0  . albuterol (PROVENTIL HFA;VENTOLIN HFA) 108 (90 Base) MCG/ACT inhaler Inhale 2 puffs into the lungs every 6 (six) hours as needed for wheezing or shortness of breath. 1 Inhaler 5  . apixaban (ELIQUIS) 5 MG TABS tablet Take 1 tablet (5 mg total) by mouth daily. (Patient taking differently: Take 5 mg by mouth 2 (two) times daily. )    . calcitRIOL (ROCALTROL) 0.5 MCG capsule Take 1.5 mcg by mouth daily.     . calcium carbonate (TUMS - DOSED IN MG ELEMENTAL CALCIUM) 500 MG chewable tablet Chew 1 tablet (200 mg of elemental calcium total) by mouth 2 (two) times daily with a meal.    . citric acid-potassium citrate (POLYCITRA) 1100-334 MG/5ML solution Take 10 mEq by mouth daily.     . Cyanocobalamin (VITAMIN B-12 IJ) Inject as directed every 30 (thirty) days.    [name] desoximetasone (TOPICORT) 0.05 % cream Apply topically 2 (two) times daily as needed. 60 g 0  . diltiazem (CARDIZEM CD) 240 MG 24 hr capsule TAKE 1 CAPSULE BY MOUTH  DAILY 90 capsule 0  . ELIQUIS 5 MG TABS tablet TAKE 1 TABLET BY MOUTH TWO  TIMES DAILY 180 tablet 2  . fluticasone (FLONASE) 50 MCG/ACT nasal spray Place 2 sprays into both nostrils daily as needed for allergies or rhinitis.     . furosemide (LASIX) 40 MG tablet TAKE 1 TABLET BY MOUTH IN  MORNING AND 1/2 TABLET BY  MOUTH IN EVENING (Patient taking differently: Take 1 tablet by mouth twice a day.) 45 tablet 0  . levocetirizine (XYZAL) 5 MG tablet TAKE 1 TABLET BY MOUTH  EVERY EVENING 90 tablet 3  . levothyroxine (SYNTHROID, LEVOTHROID) 100 MCG tablet Take 300 mcg by mouth daily.     [name] loratadine (CLARITIN) 10 MG tablet TK 1 T PO ONCE A DAY  5  . losartan (COZAAR) 50 MG tablet TAKE 1 TABLET BY MOUTH  DAILY 90 tablet 1  . neomycin-bacitracin-polymyxin (NEOSPORIN) ointment Apply 1 application topically as needed for wound care. apply to eye    . omeprazole (PRILOSEC) 40 MG capsule TAKE 1 CAPSULE BY MOUTH  DAILY 90 capsule  1  . SYMBICORT 160-4.5 MCG/ACT inhaler INHALE 2 PUFFS TWO TIMES  DAILY 30.6 g 1  . traMADol (ULTRAM) 50 MG tablet Take 2 tablets (100 mg total) by mouth every 8 (eight) hours as needed for moderate pain. 30 tablet 0  . Vitamin D, Ergocalciferol, (DRISDOL) 50000 units CAPS capsule TAKE 1 CAPSULE BY MOUTH 3  TIMES WEEKLY (Patient taking differently: TAKE 1 CAPSULE BY MOUTH 4 TIMES WEEKLY) 33 capsule 5  . zafirlukast (ACCOLATE) 20 MG tablet TAKE 1 TABLET BY MOUTH 2  TIMES DAILY BEFORE MEALS 180 tablet 1   No current facility-administered medications on file prior to visit.     BP 110/64 (BP Location: Right Arm, Patient Position: Sitting, Cuff Size: Large)   Pulse 75   Temp 98.3 F (36.8 C) (Oral)   Resp 16   Ht 5\' 4"  (1.626 m)   Wt  (!) 323 lb 3.2 oz (146.6 kg)   LMP 06/11/2012   SpO2 96%   BMI 55.48 kg/m       Objective:   Physical Exam  Constitutional: She is oriented to person, place, and time. She appears well-developed and well-nourished.  HENT:  Head: Normocephalic and atraumatic.  Cardiovascular: Normal rate, regular rhythm and normal heart sounds.  No murmur heard. 3+ bilateral LE edema  Pulmonary/Chest: Effort normal and breath sounds normal. No respiratory distress. She has no wheezes.  Neurological: She is alert and oriented to person, place, and time.  Psychiatric: She has a normal mood and affect. Her behavior is normal. Judgment and thought content normal.          Assessment & Plan:  Volume overload- suspect acute on chronic diastolic CHF exacerbation.  Will increase lasix to 80mg  AM and 40mg  PM x 1 week. She will weigh daily and call me if her weight gets down to 315 which I think is close to her dry weight.  Obtain follow up bmet today. Follow up in 1 week.

## 2017-08-20 NOTE — Patient Instructions (Signed)
Increase lasix to 2 tabs in the AM and 1 tab in the PM for the next 1 week.

## 2017-08-21 ENCOUNTER — Other Ambulatory Visit: Payer: Self-pay | Admitting: Family

## 2017-08-21 ENCOUNTER — Encounter: Payer: Self-pay | Admitting: Family

## 2017-08-21 MED ORDER — LOSARTAN POTASSIUM 50 MG PO TABS: ORAL_TABLET | ORAL | 1 refills | Status: DC

## 2017-08-21 NOTE — Telephone Encounter (Signed)
Refill request received for losartan. Last office visit 08/20/17 and last refill 07/03/17. Sent to [facility] per pt request.   Also had request for levocetirizine and omeprazole from [facility]. Called pt to ask if she would like these sent to [facility] or [facility] with the losartan. Pt verified that the losartan should go to [facility] because she will need that now and the other two refills can go through [facility]. Refills have been sent to requested pharmacies.

## 2017-08-27 DIAGNOSIS — Z85850 Personal history of malignant neoplasm of thyroid: Secondary | ICD-10-CM | POA: Diagnosis not present

## 2017-08-27 DIAGNOSIS — E892 Postprocedural hypoparathyroidism: Secondary | ICD-10-CM | POA: Diagnosis not present

## 2017-08-27 DIAGNOSIS — E039 Hypothyroidism, unspecified: Secondary | ICD-10-CM | POA: Diagnosis not present

## 2017-08-28 ENCOUNTER — Telehealth: Payer: Self-pay | Admitting: Family

## 2017-08-28 ENCOUNTER — Ambulatory Visit: Payer: 59 | Admitting: Family

## 2017-08-28 ENCOUNTER — Encounter: Payer: Self-pay | Admitting: Family

## 2017-08-28 ENCOUNTER — Ambulatory Visit (HOSPITAL_BASED_OUTPATIENT_CLINIC_OR_DEPARTMENT_OTHER)
Admission: RE | Admit: 2017-08-28 | Discharge: 2017-08-28 | Disposition: A | Discharge status: Home / Self Care | Payer: 59 | Source: Ambulatory Visit | Attending: Family | Admitting: Family

## 2017-08-28 VITALS — BP 144/55 | HR 79 | Resp 18 | Ht 64.0 in | Wt 322.0 lb

## 2017-08-28 DIAGNOSIS — I5032 Chronic diastolic (congestive) heart failure: Secondary | ICD-10-CM

## 2017-08-28 DIAGNOSIS — E8770 Fluid overload, unspecified: Secondary | ICD-10-CM

## 2017-08-28 DIAGNOSIS — I509 Heart failure, unspecified: Secondary | ICD-10-CM | POA: Diagnosis not present

## 2017-08-28 DIAGNOSIS — E559 Vitamin D deficiency, unspecified: Secondary | ICD-10-CM

## 2017-08-28 LAB — BASIC METABOLIC PANEL
BUN: 17 mg/dL (ref 6–23)
CHLORIDE: 96 meq/L (ref 96–112)
CO2: 34 meq/L — AB (ref 19–32)
CREATININE: 0.7 mg/dL (ref 0.40–1.20)
Calcium: 7.1 mg/dL — ABNORMAL LOW (ref 8.4–10.5)
GFR: 90.63 mL/min (ref >60.00)
Glucose, Bld: 92 mg/dL (ref 70–99)
Potassium: 3.8 mEq/L (ref 3.5–5.1)
Sodium: 140 mEq/L (ref 135–145)

## 2017-08-28 LAB — BRAIN NATRIURETIC PEPTIDE: Pro B Natriuretic peptide (BNP): 17 pg/mL (ref 0.0–100.0)

## 2017-08-28 NOTE — Telephone Encounter (Signed)
Calcium is low. Spoke with patient.  She saw Dr. Buddy Duty yesterday and he advised her to start calcium 1000mg  tid. She is starting that today.  I have asked her to return to the lab on Monday and she will come at 8.30 am for follow up bmet. Advised pt to decrease lasix to 40mg  bid and call if >3 pound weight gain. She verbalizes understanding. Case also reviewed with Dr. Charlett Blake.   Gilmore Laroche, could you please add to nurse visit schedule?

## 2017-08-28 NOTE — Progress Notes (Signed)
Subjective:    Patient ID: [name], female    DOB: 03-13-1956, 62 y.o.   MRN: [id number]  HPI  [name] is a 62 yr old female who presents today for follow up. Last visit we increased her lasix to 80mg  AM and 40mg  PM x 1 week.  Her weight is down 1 pounds since that time. She has hx of grade 1 diastolic dysfunction 02/58/52 per 2D echo.   Wt Readings from Last 3 Encounters:  08/28/17 (!) 322 lb (146.1 kg)  08/20/17 (!) 323 lb 3.2 oz (146.6 kg)  08/14/17 (!) 320 lb (145.2 kg)   Reports that she woke up at 3:30 this am with wheezing and felt short of breath.  She took 2 lasix.  Feeling a little better but "still very concerned."   BP Readings from Last 3 Encounters:  08/28/17 (!) 144/55  08/20/17 110/64  07/27/17 (!) 94/51    Review of Systems See HPI  Past Medical History:  Diagnosis Date  . Abnormal Pap smear    years ago/no biopsy  . Allergic rhinitis   . Anemia, iron deficiency   . Arthritis    back- lower  . Asthma   . Atrial fibrillation ([place]) August 2012   OFF XARELTO LAST MONTH DUE TO BLEEDING IN STOOL  . Bursitis of hip left  . Dysrhythmia    afib, followed by Dr. Stanford Breed   . Edema of both legs   . Fatty liver 01/07/2012  . Fibroid 1974   fibroid cyst on left fallopian tube  . Fibromyalgia   . Foot ulcer ([place])    AREA HEALED RIGHT FOOT  . GERD (gastroesophageal reflux disease)   . Headache(784.0)    occasional sinus headache   . History of blood transfusion JULY 2015  . Hypertension   . Hypoparathyroidism ([place]) 01/07/2011  . Hypothyroidism   . Kidney stone Oct 12, 2015   passed on their own  . Morbid obesity ([place])   . Nephrolithiasis 2/16, 9/16   SEES DR Risa Grill  . Pernicious anemia 02/20/2014   followed by Debbrah Alar  . PONV (postoperative nausea and vomiting)   . Seizures ([place])    infancy secondary to fever  . Thyroid cancer ([place]) 2011   THYROIDECTOMY DONE  . Uterine cancer ([place]) 2014   Mirena IUD  . Vitamin D deficiency        Social History   Socioeconomic History  . Marital status: Single    Spouse name: Not on file  . Number of children: 0  . Years of education: Not on file  . Highest education level: Not on file  Occupational History  . Occupation: works in Insurance claims handler  . Occupation: DESIGN COMPUTER CHIP    Employer: [employer]  Social Needs  . Financial resource strain: Not on file  . Food insecurity:    Worry: Not on file    Inability: Not on file  . Transportation needs:    Medical: Not on file    Non-medical: Not on file  Tobacco Use  . Smoking status: Former Smoker    Packs/day: 0.50    Years: 25.00    Pack years: 12.50    Types: Cigarettes    Start date: 12/24/1970    Last attempt to quit: 05/27/1995    Years since quitting: 22.2  . Smokeless tobacco: Never Used  . Tobacco comment: quit smoking 19 years ago  Substance and Sexual Activity  . Alcohol use: Yes    Alcohol/week:  0.0 oz    Comment: 1/2 glass per month  . Drug use: No  . Sexual activity: Yes    Partners: Male    Birth control/protection: Post-menopausal  Lifestyle  . Physical activity:    Days per week: Not on file    Minutes per session: Not on file  . Stress: Not on file  Relationships  . Social connections:    Talks on phone: Not on file    Gets together: Not on file    Attends religious service: Not on file    Active member of club or organization: Not on file    Attends meetings of clubs or organizations: Not on file    Relationship status: Not on file  . Intimate partner violence:    Fear of current or ex partner: Not on file    Emotionally abused: Not on file    Physically abused: Not on file    Forced sexual activity: Not on file  Other Topics Concern  . Not on file  Social History Narrative   Occupation: works in Insurance claims handler - Field seismologist   Single       Former Smoker - quit tobacco 12 years ago.  She was light smoker for 10 years.                 Past Surgical History:   Procedure Laterality Date  . AMPUTATION Right 05/18/2014   Procedure: RIGHT FIFTH RAY AMPUTATION FOOT;  Surgeon: Wylene Simmer, MD;  Location: [facility];  Service: Orthopedics;  Laterality: Right;  . APPENDECTOMY    . BUNIONECTOMY     bilateral  . COLONOSCOPY WITH PROPOFOL N/A 02/02/2014   Procedure: COLONOSCOPY WITH PROPOFOL;  Surgeon: Irene Shipper, MD;  Location: [facility];  Service: Endoscopy;  Laterality: N/A;  . cyst on ovary removed     . CYSTOSCOPY W/ RETROGRADES  03/03/2012   Procedure: CYSTOSCOPY WITH RETROGRADE PYELOGRAM;  Surgeon: Bernestine Amass, MD;  Location: [facility];  Service: Urology;  Laterality: Bilateral;  . CYSTOSCOPY WITH RETROGRADE PYELOGRAM, URETEROSCOPY AND STENT PLACEMENT Left 08/25/2012   Procedure: CYSTOSCOPY WITH RETROGRADE PYELOGRAM, URETEROSCOPY;  Surgeon: Bernestine Amass, MD;  Location: [facility];  Service: Urology;  Laterality: Left;  . DILATION AND CURETTAGE OF UTERUS N/A 02/21/2013   Procedure: DILATATION AND CURETTAGE;  Surgeon: Lyman Speller, MD;  Location: [facility];  Service: Gynecology;  Laterality: N/A;  . DILATION AND CURETTAGE OF UTERUS N/A 04/09/2015   Procedure: [place] IUD removal;  Surgeon: Megan Salon, MD;  Location: [facility];  Service: Gynecology;  Laterality: N/A;  Patient weight 307lbs  . ESOPHAGOGASTRODUODENOSCOPY (EGD) WITH PROPOFOL N/A 02/02/2014   Procedure: ESOPHAGOGASTRODUODENOSCOPY (EGD) WITH PROPOFOL;  Surgeon: Irene Shipper, MD;  Location: [facility];  Service: Endoscopy;  Laterality: N/A;  . GASTRIC BYPASS  1974  . HOLMIUM LASER APPLICATION Left 0/10/2374   Procedure: HOLMIUM LASER APPLICATION;  Surgeon: Bernestine Amass, MD;  Location: [facility];  Service: Urology;  Laterality: Left;  . LITHOTRIPSY  03/2012  . LITHOTRIPSY  2/16  . THYROIDECTOMY  05/15/2010  . TONSILLECTOMY AND ADENOIDECTOMY    . URETEROSCOPY  03/03/2012   Procedure: URETEROSCOPY;  Surgeon: Bernestine Amass, MD;  Location: [facility];  Service: Urology;  Laterality:  Left;    Family History  Problem Relation Age of Onset  . Hypertension Father   . Diabetes Father   . Lung cancer Father   . Heart attack Father  MI at age 53  . Hypertension Mother   . Hyperthyroidism Mother   . Heart disease Mother   . Heart attack Mother        MI at age 25  . Asthma Brother   . Hypertension Brother        younger  . Heart disease Brother        older  . Colon cancer Neg Hx   . Esophageal cancer Neg Hx   . Stomach cancer Neg Hx   . Kidney disease Neg Hx   . Liver disease Neg Hx     Allergies  Allergen Reactions  . Other Anaphylaxis    NUTS  . Penicillins Other (See Comments)    Headache. Has patient had a PCN reaction causing immediate rash, facial/tongue/throat swelling, SOB or lightheadedness with hypotension: No Has patient had a PCN reaction causing severe rash involving mucus membranes or skin necrosis: No Has patient had a PCN reaction that required hospitalization: No Has patient had a PCN reaction occurring within the last 10 years: Yes If all of the above answers are "NO", then may proceed with Cephalosporin use.   [name] Amoxicillin-Pot Clavulanate Other (See Comments)    headache  . Food Hives    Potato  . Tomato Hives    Current Outpatient Medications on File Prior to Visit  Medication Sig Dispense Refill  . acetaminophen (TYLENOL) 325 MG tablet Take 2 tablets (650 mg total) by mouth every 6 (six) hours as needed for mild pain or moderate pain. 60 tablet 0  . albuterol (PROVENTIL HFA;VENTOLIN HFA) 108 (90 Base) MCG/ACT inhaler Inhale 2 puffs into the lungs every 6 (six) hours as needed for wheezing or shortness of breath. 1 Inhaler 5  . apixaban (ELIQUIS) 5 MG TABS tablet Take 1 tablet (5 mg total) by mouth daily. (Patient taking differently: Take 5 mg by mouth 2 (two) times daily. )    . calcitRIOL (ROCALTROL) 0.5 MCG capsule Take 1.5 mcg by mouth daily.     . calcium carbonate (TUMS - DOSED IN MG ELEMENTAL CALCIUM) 500 MG chewable  tablet Chew 1 tablet (200 mg of elemental calcium total) by mouth 3 (three) times daily.    . citric acid-potassium citrate (POLYCITRA) 1100-334 MG/5ML solution Take 10 mEq by mouth daily.    . Cyanocobalamin (VITAMIN B-12 IJ) Inject as directed every 30 (thirty) days.    [name] desoximetasone (TOPICORT) 0.05 % cream Apply topically 2 (two) times daily as needed. 60 g 0  . diltiazem (CARDIZEM CD) 240 MG 24 hr capsule TAKE 1 CAPSULE BY MOUTH  DAILY 90 capsule 0  . ELIQUIS 5 MG TABS tablet TAKE 1 TABLET BY MOUTH TWO  TIMES DAILY (Patient taking differently: TAKE 1 TABLET BY MOUTH ONCE DAILY) 180 tablet 2  . fluticasone (FLONASE) 50 MCG/ACT nasal spray Place 2 sprays into both nostrils daily as needed for allergies or rhinitis.     . furosemide (LASIX) 40 MG tablet 2 tabs by mouth daily in the AM and 1 tablet in the PM 270 tablet 1  . levocetirizine (XYZAL) 5 MG tablet TAKE 1 TABLET BY MOUTH  EVERY EVENING 90 tablet 3  . levothyroxine (SYNTHROID, LEVOTHROID) 100 MCG tablet Take 300 mcg by mouth daily.     [name] loratadine (CLARITIN) 10 MG tablet TK 1 T PO ONCE A DAY  5  . losartan (COZAAR) 50 MG tablet TAKE 1 TABLET BY MOUTH  DAILY 90 tablet 1  . neomycin-bacitracin-polymyxin (NEOSPORIN) ointment Apply  1 application topically as needed for wound care. apply to eye    . omeprazole (PRILOSEC) 40 MG capsule TAKE 1 CAPSULE BY MOUTH  DAILY 90 capsule 1  . SYMBICORT 160-4.5 MCG/ACT inhaler INHALE 2 PUFFS TWO TIMES  DAILY 30.6 g 1  . traMADol (ULTRAM) 50 MG tablet Take 2 tablets (100 mg total) by mouth every 8 (eight) hours as needed for moderate pain. 30 tablet 0  . Vitamin D, Ergocalciferol, (DRISDOL) 50000 units CAPS capsule TAKE 1 CAPSULE BY MOUTH 3  TIMES WEEKLY (Patient taking differently: TAKE 1 CAPSULE BY MOUTH 4 TIMES WEEKLY) 33 capsule 5  . zafirlukast (ACCOLATE) 20 MG tablet TAKE 1 TABLET BY MOUTH 2  TIMES DAILY BEFORE MEALS 180 tablet 1   No current facility-administered medications on file prior to  visit.     BP (!) 144/55 (BP Location: Left Arm, Patient Position: Sitting, Cuff Size: Large)   Pulse 79   Resp 18   Ht 5\' 4"  (1.626 m)   Wt (!) 322 lb (146.1 kg)   LMP 06/11/2012   SpO2 97%   BMI 55.27 kg/m       Objective:   Physical Exam  Constitutional: She is oriented to person, place, and time. She appears well-developed and well-nourished.  Cardiovascular: Normal rate, regular rhythm and normal heart sounds.  No murmur heard. Pulmonary/Chest: Effort normal and breath sounds normal. No respiratory distress. She has no wheezes.  Musculoskeletal:  2-3+ bilateral LE edema  Neurological: She is alert and oriented to person, place, and time.  Psychiatric: She has a normal mood and affect. Her behavior is normal. Judgment and thought content normal.          Assessment & Plan:  Volume overload- Edema only slightly improved. Continue lasix 80mg  AM /40mg  PM. Check CXR to rule out pulmonary edema.  If BNP ok and CXR OK, may need to consider that her SOB is more of a pulmonary nature (asthma) and secondary to overall habitus/deconditioning.

## 2017-08-28 NOTE — Patient Instructions (Signed)
Please complete lab work prior to leaving. Continue current dosing of lasix. Complete chest x-ray on the first floor. Call if new/worsening symptoms.

## 2017-08-30 NOTE — Telephone Encounter (Signed)
Opened in error

## 2017-08-31 ENCOUNTER — Other Ambulatory Visit (INDEPENDENT_AMBULATORY_CARE_PROVIDER_SITE_OTHER): Payer: 59

## 2017-08-31 DIAGNOSIS — E559 Vitamin D deficiency, unspecified: Secondary | ICD-10-CM

## 2017-08-31 NOTE — Telephone Encounter (Signed)
Spoke with pt and scheduled lab appt for today at 4pm, future orders already signed for vit d and bmet.

## 2017-09-01 ENCOUNTER — Other Ambulatory Visit: Payer: Self-pay | Admitting: Family

## 2017-09-01 ENCOUNTER — Telehealth: Payer: Self-pay | Admitting: Family

## 2017-09-01 LAB — BASIC METABOLIC PANEL
BUN: 17 mg/dL (ref 6–23)
CO2: 31 mEq/L (ref 19–32)
CREATININE: 0.73 mg/dL (ref 0.40–1.20)
Calcium: 7.4 mg/dL — ABNORMAL LOW (ref 8.4–10.5)
Chloride: 97 mEq/L (ref 96–112)
GFR: 86.34 mL/min (ref >60.00)
Glucose, Bld: 75 mg/dL (ref 70–99)
Potassium: 3.8 mEq/L (ref 3.5–5.1)
Sodium: 140 mEq/L (ref 135–145)

## 2017-09-01 MED ORDER — CALCIUM CARBONATE ANTACID 500 MG PO CHEW: 2.0000 | CHEWABLE_TABLET | Freq: Four times a day (QID) | ORAL | Status: DC

## 2017-09-01 NOTE — Telephone Encounter (Signed)
Calcium came up slightly to 7.4.  Please ask pt to increase calcium from 1000mg  PO TID to 1000mg  PO QID.  Repeat BMET on Friday.

## 2017-09-02 NOTE — Telephone Encounter (Signed)
Notified pt and she will return for lab on 09/04/17 at 8:45am. Future order entered.

## 2017-09-03 LAB — VITAMIN D 1,25 DIHYDROXY
VITAMIN D 1, 25 (OH) TOTAL: 39 pg/mL (ref 18–72)
VITAMIN D3 1, 25 (OH): 27 pg/mL
Vitamin D2 1, 25 (OH)2: 12 pg/mL

## 2017-09-04 ENCOUNTER — Other Ambulatory Visit (INDEPENDENT_AMBULATORY_CARE_PROVIDER_SITE_OTHER): Payer: 59

## 2017-09-04 LAB — BASIC METABOLIC PANEL
BUN: 16 mg/dL (ref 6–23)
CO2: 33 meq/L — AB (ref 19–32)
CREATININE: 0.76 mg/dL (ref 0.40–1.20)
Calcium: 7.8 mg/dL — ABNORMAL LOW (ref 8.4–10.5)
Chloride: 99 mEq/L (ref 96–112)
GFR: 82.42 mL/min (ref >60.00)
GLUCOSE: 86 mg/dL (ref 70–99)
Potassium: 4 mEq/L (ref 3.5–5.1)
Sodium: 142 mEq/L (ref 135–145)

## 2017-09-07 ENCOUNTER — Encounter: Payer: Self-pay | Admitting: Podiatry

## 2017-09-07 ENCOUNTER — Ambulatory Visit: Payer: 59 | Admitting: Podiatry

## 2017-09-07 DIAGNOSIS — M2042 Other hammer toe(s) (acquired), left foot: Secondary | ICD-10-CM

## 2017-09-07 DIAGNOSIS — M2030 Hallux varus (acquired), unspecified foot: Secondary | ICD-10-CM

## 2017-09-07 DIAGNOSIS — M79675 Pain in left toe(s): Secondary | ICD-10-CM

## 2017-09-07 DIAGNOSIS — M79674 Pain in right toe(s): Secondary | ICD-10-CM | POA: Diagnosis not present

## 2017-09-07 DIAGNOSIS — B351 Tinea unguium: Secondary | ICD-10-CM

## 2017-09-07 DIAGNOSIS — M2041 Other hammer toe(s) (acquired), right foot: Secondary | ICD-10-CM | POA: Diagnosis not present

## 2017-09-08 ENCOUNTER — Ambulatory Visit: Payer: 59 | Admitting: Podiatry

## 2017-09-09 ENCOUNTER — Other Ambulatory Visit: Payer: Self-pay | Admitting: Family

## 2017-09-09 MED ORDER — FUROSEMIDE 40 MG PO TABS: ORAL_TABLET | ORAL | 1 refills | Status: DC

## 2017-09-09 NOTE — Progress Notes (Signed)
Subjective: 62 year old female presents the office today for concerns of both of her big toes causing pain.  They are in a varus position and she has had several surgeries on her feet.  She states that the toes are very painful with pressure in shoes.  She is able to wear a shoe on the right side but causes pain but she has wear surgical shoe in the left foot because of the weather toe is positioned.  She will discuss surgical options in order to help reduce her deformity pains the pain as well.  She has also had a history of a partial fifth ray resection on the right side due to infection and is healed uneventfully. Denies any systemic complaints such as fevers, chills, nausea, vomiting. No acute changes since last appointment, and no other complaints at this time.   Objective: AAO x3, NAD DP/PT pulses palpable bilaterally, CRT less than 3 seconds Significant hallux varus is present bilaterally.  On the right side appears to be somewhat reducible to a rectus position.  On the left foot there is her hallux to be almost a 90 degree angle and there is a hallux malleus also present.  The left side is much more rigid than the right side.  There is tenderness with the toes.  On the right foot there is also some tenderness along the second, third, fourth digits there is that of a contracted position.  This also causes irritation side issues. Nails are hypertrophic, dystrophic, discolored with yellow-brown discoloration x9.  The left hallux toenail is loose from the underlying nail bed and there is a yellow to brown discoloration of the nail.  There is no edema, erythema, drainage or pus or any clinical signs of infection to the toes. No open lesions or pre-ulcerative lesions.  No pain with calf compression, swelling, warmth, erythema  Assessment: Significant hallux varus present bilaterally left side >> right  Plan: -All treatment options discussed with the patient including all alternatives, risks,  complications.  -X-rays and her chart reviewed with her.  We had a long discussion regards to surgical options for her feet.  We discussed the first MPJ arthrodesis on the right side and left for the first MPJ arthrodesis with IPJ arthrodesis as well.  We also discussed the left side possible amputation of the toe but should try to save the toe with possible been doing the surgery as her risk and there is a chance that she will end up with amputation.  She cannot do the surgery until July does not think about her options but I do think that surgery be beneficial for her given the significant deformity to the toes. -I did debride the nails x9 without any complications or bleeding -Patient encouraged to call the office with any questions, concerns, change in symptoms.   Trula Slade DPM

## 2017-09-10 ENCOUNTER — Other Ambulatory Visit: Payer: Self-pay | Admitting: Family

## 2017-09-15 ENCOUNTER — Ambulatory Visit (INDEPENDENT_AMBULATORY_CARE_PROVIDER_SITE_OTHER): Payer: 59 | Admitting: *Deleted

## 2017-09-15 DIAGNOSIS — D510 Vitamin B12 deficiency anemia due to intrinsic factor deficiency: Secondary | ICD-10-CM | POA: Diagnosis not present

## 2017-09-15 MED ORDER — CYANOCOBALAMIN 1000 MCG/ML IJ SOLN
1000.0000 ug | Freq: Once | INTRAMUSCULAR | Status: AC
  Administered 2017-09-15: 1000 ug via INTRAMUSCULAR

## 2017-09-15 NOTE — Progress Notes (Signed)
Pre visit review using our clinic review tool, if applicable. No additional management support is needed unless otherwise documented below in the visit note.  Pt here for B12 injection per Debbrah Alar, NP  B12 1000 mcg given IM, right deltoid and pt tolerated injection well.   Pt will return on 10/14/17 at 10:30am for next b12 injection.

## 2017-09-16 ENCOUNTER — Other Ambulatory Visit: Payer: Self-pay | Admitting: Family

## 2017-09-16 NOTE — Progress Notes (Signed)
Reviewed and agree.  Gawain Crombie [name] NP 

## 2017-09-25 ENCOUNTER — Ambulatory Visit: Payer: 59 | Admitting: Family

## 2017-09-28 ENCOUNTER — Ambulatory Visit: Payer: 59 | Admitting: Hematology & Oncology

## 2017-09-28 ENCOUNTER — Encounter: Payer: Self-pay | Admitting: Family

## 2017-09-28 ENCOUNTER — Other Ambulatory Visit: Payer: 59

## 2017-09-30 ENCOUNTER — Encounter: Payer: Self-pay | Admitting: Family

## 2017-09-30 ENCOUNTER — Ambulatory Visit: Payer: 59 | Admitting: Family

## 2017-09-30 VITALS — BP 144/80 | HR 85 | Temp 98.3°F | Resp 16 | Ht 64.0 in | Wt 326.2 lb

## 2017-09-30 DIAGNOSIS — I10 Essential (primary) hypertension: Secondary | ICD-10-CM

## 2017-09-30 DIAGNOSIS — R609 Edema, unspecified: Secondary | ICD-10-CM

## 2017-09-30 MED ORDER — LOSARTAN POTASSIUM 100 MG PO TABS: 100.0000 mg | ORAL_TABLET | Freq: Every day | ORAL | 1 refills | Status: DC

## 2017-09-30 NOTE — Patient Instructions (Signed)
Please increase your losartan to 100mg  once daily. Wear compression stockings during the day.

## 2017-09-30 NOTE — Progress Notes (Signed)
Subjective:    Patient ID: [name], female    DOB: 1956/02/01, 62 y.o.   MRN: [id number]  HPI  [name] is a 62 yr old female who presents today for follow up.  HTN- maintained on losartan, lasix,  BP Readings from Last 3 Encounters:  09/30/17 (!) 144/80  08/28/17 (!) 144/55  08/20/17 110/64   Swelling- reports swelling is essentially unchanged.  Reports that she  Wt Readings from Last 3 Encounters:  09/30/17 (!) 326 lb 3.2 oz (148 kg)  08/28/17 (!) 322 lb (146.1 kg)  08/20/17 (!) 323 lb 3.2 oz (146.6 kg)   Reports rare sob.  Swelling is a bit worse.  Hypocalcemia- reports medication was adjusted by endo and that she is scheduled for follow up blood work with him next week.   Review of Systems See HPI  Past Medical History:  Diagnosis Date  . Abnormal Pap smear    years ago/no biopsy  . Allergic rhinitis   . Anemia, iron deficiency   . Arthritis    back- lower  . Asthma   . Atrial fibrillation ([place]) August 2012   OFF XARELTO LAST MONTH DUE TO BLEEDING IN STOOL  . Bursitis of hip left  . Dysrhythmia    afib, followed by Dr. Stanford Breed   . Edema of both legs   . Fatty liver 01/07/2012  . Fibroid 1974   fibroid cyst on left fallopian tube  . Fibromyalgia   . Foot ulcer ([place])    AREA HEALED RIGHT FOOT  . GERD (gastroesophageal reflux disease)   . Headache(784.0)    occasional sinus headache   . History of blood transfusion JULY 2015  . Hypertension   . Hypoparathyroidism ([place]) 01/07/2011  . Hypothyroidism   . Kidney stone 10/16/15   passed on their own  . Morbid obesity ([place])   . Nephrolithiasis 2/16, 9/16   SEES DR Risa Grill  . Pernicious anemia 02/20/2014   followed by Debbrah Alar  . PONV (postoperative nausea and vomiting)   . Seizures ([place])    infancy secondary to fever  . Thyroid cancer ([place]) 2011   THYROIDECTOMY DONE  . Uterine cancer ([place]) 2014   Mirena IUD  . Vitamin D deficiency      Social History   Socioeconomic History  .  Marital status: Single    Spouse name: Not on file  . Number of children: 0  . Years of education: Not on file  . Highest education level: Not on file  Occupational History  . Occupation: works in Insurance claims handler  . Occupation: DESIGN COMPUTER CHIP    Employer: [employer]  Social Needs  . Financial resource strain: Not on file  . Food insecurity:    Worry: Not on file    Inability: Not on file  . Transportation needs:    Medical: Not on file    Non-medical: Not on file  Tobacco Use  . Smoking status: Former Smoker    Packs/day: 0.50    Years: 25.00    Pack years: 12.50    Types: Cigarettes    Start date: 12/24/1970    Last attempt to quit: 05/27/1995    Years since quitting: 22.3  . Smokeless tobacco: Never Used  . Tobacco comment: quit smoking 19 years ago  Substance and Sexual Activity  . Alcohol use: Yes    Alcohol/week: 0.0 oz    Comment: 1/2 glass per month  . Drug use: No  . Sexual activity: Yes  Partners: Male    Birth control/protection: Post-menopausal  Lifestyle  . Physical activity:    Days per week: Not on file    Minutes per session: Not on file  . Stress: Not on file  Relationships  . Social connections:    Talks on phone: Not on file    Gets together: Not on file    Attends religious service: Not on file    Active member of club or organization: Not on file    Attends meetings of clubs or organizations: Not on file    Relationship status: Not on file  . Intimate partner violence:    Fear of current or ex partner: Not on file    Emotionally abused: Not on file    Physically abused: Not on file    Forced sexual activity: Not on file  Other Topics Concern  . Not on file  Social History Narrative   Occupation: works in Insurance claims handler - Field seismologist   Single       Former Smoker - quit tobacco 12 years ago.  She was light smoker for 10 years.                 Past Surgical History:  Procedure Laterality Date  . AMPUTATION  Right 05/18/2014   Procedure: RIGHT FIFTH RAY AMPUTATION FOOT;  Surgeon: Wylene Simmer, MD;  Location: [facility];  Service: Orthopedics;  Laterality: Right;  . APPENDECTOMY    . BUNIONECTOMY     bilateral  . COLONOSCOPY WITH PROPOFOL N/A 02/02/2014   Procedure: COLONOSCOPY WITH PROPOFOL;  Surgeon: Irene Shipper, MD;  Location: [facility];  Service: Endoscopy;  Laterality: N/A;  . cyst on ovary removed     . CYSTOSCOPY W/ RETROGRADES  03/03/2012   Procedure: CYSTOSCOPY WITH RETROGRADE PYELOGRAM;  Surgeon: Bernestine Amass, MD;  Location: [facility];  Service: Urology;  Laterality: Bilateral;  . CYSTOSCOPY WITH RETROGRADE PYELOGRAM, URETEROSCOPY AND STENT PLACEMENT Left 08/25/2012   Procedure: CYSTOSCOPY WITH RETROGRADE PYELOGRAM, URETEROSCOPY;  Surgeon: Bernestine Amass, MD;  Location: [facility];  Service: Urology;  Laterality: Left;  . DILATION AND CURETTAGE OF UTERUS N/A 02/21/2013   Procedure: DILATATION AND CURETTAGE;  Surgeon: Lyman Speller, MD;  Location: [facility];  Service: Gynecology;  Laterality: N/A;  . DILATION AND CURETTAGE OF UTERUS N/A 04/09/2015   Procedure: [place] IUD removal;  Surgeon: Megan Salon, MD;  Location: [facility];  Service: Gynecology;  Laterality: N/A;  Patient weight 307lbs  . ESOPHAGOGASTRODUODENOSCOPY (EGD) WITH PROPOFOL N/A 02/02/2014   Procedure: ESOPHAGOGASTRODUODENOSCOPY (EGD) WITH PROPOFOL;  Surgeon: Irene Shipper, MD;  Location: [facility];  Service: Endoscopy;  Laterality: N/A;  . GASTRIC BYPASS  1974  . HOLMIUM LASER APPLICATION Left 11/23/2456   Procedure: HOLMIUM LASER APPLICATION;  Surgeon: Bernestine Amass, MD;  Location: [facility];  Service: Urology;  Laterality: Left;  . LITHOTRIPSY  03/2012  . LITHOTRIPSY  2/16  . THYROIDECTOMY  05/15/2010  . TONSILLECTOMY AND ADENOIDECTOMY    . URETEROSCOPY  03/03/2012   Procedure: URETEROSCOPY;  Surgeon: Bernestine Amass, MD;  Location: [facility];  Service: Urology;  Laterality: Left;    Family History  Problem  Relation Age of Onset  . Hypertension Father   . Diabetes Father   . Lung cancer Father   . Heart attack Father        MI at age 51  . Hypertension Mother   . Hyperthyroidism Mother   . Heart  disease Mother   . Heart attack Mother        MI at age 63  . Asthma Brother   . Hypertension Brother        younger  . Heart disease Brother        older  . Colon cancer Neg Hx   . Esophageal cancer Neg Hx   . Stomach cancer Neg Hx   . Kidney disease Neg Hx   . Liver disease Neg Hx     Allergies  Allergen Reactions  . Other Anaphylaxis    NUTS  . Penicillins Other (See Comments)    Headache. Has patient had a PCN reaction causing immediate rash, facial/tongue/throat swelling, SOB or lightheadedness with hypotension: No Has patient had a PCN reaction causing severe rash involving mucus membranes or skin necrosis: No Has patient had a PCN reaction that required hospitalization: No Has patient had a PCN reaction occurring within the last 10 years: Yes If all of the above answers are "NO", then may proceed with Cephalosporin use.   [name] Amoxicillin-Pot Clavulanate Other (See Comments)    headache  . Food Hives    Potato  . Tomato Hives    Current Outpatient Medications on File Prior to Visit  Medication Sig Dispense Refill  . acetaminophen (TYLENOL) 325 MG tablet Take 2 tablets (650 mg total) by mouth every 6 (six) hours as needed for mild pain or moderate pain. 60 tablet 0  . albuterol (PROVENTIL HFA;VENTOLIN HFA) 108 (90 Base) MCG/ACT inhaler Inhale 2 puffs into the lungs every 6 (six) hours as needed for wheezing or shortness of breath. 1 Inhaler 5  . apixaban (ELIQUIS) 5 MG TABS tablet Take 1 tablet (5 mg total) by mouth daily. (Patient taking differently: Take 5 mg by mouth 2 (two) times daily. )    . calcitRIOL (ROCALTROL) 0.5 MCG capsule Take 1.5 mcg by mouth daily.     . calcium carbonate (TUMS - DOSED IN MG ELEMENTAL CALCIUM) 500 MG chewable tablet Chew 2 tablets (400 mg of  elemental calcium total) by mouth 4 (four) times daily.    . citric acid-potassium citrate (POLYCITRA) 1100-334 MG/5ML solution Take 10 mEq by mouth daily.    . Cyanocobalamin (VITAMIN B-12 IJ) Inject as directed every 30 (thirty) days.    [name] desoximetasone (TOPICORT) 0.05 % cream Apply topically 2 (two) times daily as needed. 60 g 0  . diltiazem (CARDIZEM CD) 240 MG 24 hr capsule TAKE 1 CAPSULE BY MOUTH  DAILY 90 capsule 0  . ELIQUIS 5 MG TABS tablet TAKE 1 TABLET BY MOUTH TWO  TIMES DAILY (Patient taking differently: TAKE 1 TABLET BY MOUTH ONCE DAILY) 180 tablet 2  . fluticasone (FLONASE) 50 MCG/ACT nasal spray Place 2 sprays into both nostrils daily as needed for allergies or rhinitis.     . furosemide (LASIX) 40 MG tablet 2 tabs by mouth daily in the AM and 1 tablet in the PM 270 tablet 1  . levocetirizine (XYZAL) 5 MG tablet TAKE 1 TABLET BY MOUTH  EVERY EVENING 90 tablet 3  . levothyroxine (SYNTHROID, LEVOTHROID) 100 MCG tablet Take 300 mcg by mouth daily.     [name] loratadine (CLARITIN) 10 MG tablet TK 1 T PO ONCE A DAY  5  . losartan (COZAAR) 50 MG tablet TAKE 1 TABLET BY MOUTH  DAILY 90 tablet 1  . neomycin-bacitracin-polymyxin (NEOSPORIN) ointment Apply 1 application topically as needed for wound care. apply to eye    . nystatin cream (  MYCOSTATIN) APPLY TO THE AFFECTED AREA TWICE DAILY FOR UP TO 7 DAYS 30 g 0  . omeprazole (PRILOSEC) 40 MG capsule TAKE 1 CAPSULE BY MOUTH  DAILY 90 capsule 1  . SYMBICORT 160-4.5 MCG/ACT inhaler INHALE 2 PUFFS TWO TIMES  DAILY 30.6 g 1  . traMADol (ULTRAM) 50 MG tablet Take 2 tablets (100 mg total) by mouth every 8 (eight) hours as needed for moderate pain. 30 tablet 0  . Vitamin D, Ergocalciferol, (DRISDOL) 50000 units CAPS capsule TAKE 1 CAPSULE BY MOUTH 3  TIMES WEEKLY (Patient taking differently: TAKE 1 CAPSULE BY MOUTH 4 TIMES WEEKLY) 33 capsule 5  . zafirlukast (ACCOLATE) 20 MG tablet TAKE 1 TABLET BY MOUTH TWO  TIMES DAILY BEFORE MEALS 180 tablet 1   No  current facility-administered medications on file prior to visit.     BP (!) 144/80 (BP Location: Right Arm, Patient Position: Sitting, Cuff Size: Large)   Pulse 85   Temp 98.3 F (36.8 C) (Oral)   Resp 16   Ht 5\' 4"  (1.626 m)   Wt (!) 326 lb 3.2 oz (148 kg)   LMP 06/11/2012   SpO2 93%   BMI 55.99 kg/m       Objective:   Physical Exam  Constitutional: She appears well-developed and well-nourished.  Cardiovascular: Normal rate, regular rhythm and normal heart sounds.  No murmur heard. Pulmonary/Chest: Effort normal and breath sounds normal. No respiratory distress. She has no wheezes.  Musculoskeletal:  3+ bilateral LE edema  Skin: Skin is warm and dry.  Psychiatric: She has a normal mood and affect. Her behavior is normal. Judgment and thought content normal.          Assessment & Plan:  Edema- likely multifactorial (diltiazem side effect), venous insufficiency, diastolic dysfunction. We discussed trial of support hose and also having her discuss possible discontinuation of diltiazem with cardiology.  Rx provided for compression stockings 15-74mm Hg.  She is also requesting an rx for a wheelchair which I have provided.  Continue current dose of lasix.  HTN- sbp remains slightly above goal. Will increase losartan from 50mg  to 100mg .    Hypocalcemia- clinically stable. Management per endo.

## 2017-10-05 ENCOUNTER — Telehealth: Payer: Self-pay | Admitting: *Deleted

## 2017-10-05 DIAGNOSIS — I10 Essential (primary) hypertension: Secondary | ICD-10-CM

## 2017-10-05 NOTE — Telephone Encounter (Signed)
Nurse visit appointment has been changed to 30 min. Lab appt has been scheduled for 11:10 and future order has been placed.

## 2017-10-05 NOTE — Telephone Encounter (Signed)
-----   Message from Debbrah Alar, NP sent at 09/30/2017  9:27 AM EDT ----- When she comes at the end of the month for b12 nurse visit she will also need bp check and bmet that day. Can you make a note on the nurse schedule please.

## 2017-10-13 ENCOUNTER — Other Ambulatory Visit: Payer: Self-pay | Admitting: Family

## 2017-10-14 ENCOUNTER — Ambulatory Visit (INDEPENDENT_AMBULATORY_CARE_PROVIDER_SITE_OTHER): Payer: 59 | Admitting: Family

## 2017-10-14 ENCOUNTER — Inpatient Hospital Stay: Payer: 59

## 2017-10-14 ENCOUNTER — Other Ambulatory Visit: Payer: Self-pay

## 2017-10-14 ENCOUNTER — Other Ambulatory Visit: Payer: 59

## 2017-10-14 ENCOUNTER — Inpatient Hospital Stay: Payer: 59 | Attending: Hematology & Oncology | Admitting: Hematology & Oncology

## 2017-10-14 VITALS — BP 150/55 | HR 82 | Temp 98.2°F | Resp 20 | Wt 324.0 lb

## 2017-10-14 VITALS — BP 130/74 | HR 85

## 2017-10-14 DIAGNOSIS — E538 Deficiency of other specified B group vitamins: Secondary | ICD-10-CM | POA: Diagnosis not present

## 2017-10-14 DIAGNOSIS — D500 Iron deficiency anemia secondary to blood loss (chronic): Secondary | ICD-10-CM

## 2017-10-14 DIAGNOSIS — Z9884 Bariatric surgery status: Secondary | ICD-10-CM | POA: Insufficient documentation

## 2017-10-14 DIAGNOSIS — Z7901 Long term (current) use of anticoagulants: Secondary | ICD-10-CM | POA: Insufficient documentation

## 2017-10-14 DIAGNOSIS — D510 Vitamin B12 deficiency anemia due to intrinsic factor deficiency: Secondary | ICD-10-CM

## 2017-10-14 DIAGNOSIS — I10 Essential (primary) hypertension: Secondary | ICD-10-CM

## 2017-10-14 DIAGNOSIS — K912 Postsurgical malabsorption, not elsewhere classified: Secondary | ICD-10-CM | POA: Insufficient documentation

## 2017-10-14 DIAGNOSIS — Z79899 Other long term (current) drug therapy: Secondary | ICD-10-CM | POA: Insufficient documentation

## 2017-10-14 DIAGNOSIS — D509 Iron deficiency anemia, unspecified: Secondary | ICD-10-CM | POA: Insufficient documentation

## 2017-10-14 LAB — CMP (CANCER CENTER ONLY)
ALT: 34 U/L (ref 0–55)
ANION GAP: 12 — AB (ref 3–11)
AST: 21 U/L (ref 5–34)
Albumin: 4 g/dL (ref 3.5–5.0)
Alkaline Phosphatase: 122 U/L (ref 40–150)
BUN: 18 mg/dL (ref 7–26)
CHLORIDE: 100 mmol/L (ref 98–109)
CO2: 29 mmol/L (ref 22–29)
Calcium: 8.6 mg/dL (ref 8.4–10.4)
Creatinine: 0.83 mg/dL (ref 0.60–1.10)
Glucose, Bld: 84 mg/dL (ref 70–140)
Potassium: 3.8 mmol/L (ref 3.5–5.1)
SODIUM: 141 mmol/L (ref 136–145)
Total Bilirubin: 0.6 mg/dL (ref 0.2–1.2)
Total Protein: 8.2 g/dL (ref 6.4–8.3)

## 2017-10-14 LAB — RETICULOCYTES
RBC.: 5.13 MIL/uL (ref 3.70–5.45)
Retic Count, Absolute: 102.6 10*3/uL — ABNORMAL HIGH (ref 33.7–90.7)
Retic Ct Pct: 2 % (ref 0.7–2.1)

## 2017-10-14 LAB — CBC WITH DIFFERENTIAL (CANCER CENTER ONLY)
BASOS PCT: 0 %
Basophils Absolute: 0 10*3/uL (ref 0.0–0.1)
EOS ABS: 0.2 10*3/uL (ref 0.0–0.5)
EOS PCT: 2 %
HCT: 46.5 % (ref 34.8–46.6)
Hemoglobin: 15.2 g/dL (ref 11.6–15.9)
Lymphocytes Relative: 10 %
Lymphs Abs: 1.2 10*3/uL (ref 0.9–3.3)
MCH: 28.6 pg (ref 26.0–34.0)
MCHC: 32.7 g/dL (ref 32.0–36.0)
MCV: 87.6 fL (ref 81.0–101.0)
MONO ABS: 0.6 10*3/uL (ref 0.1–0.9)
MONOS PCT: 5 %
NEUTROS PCT: 83 %
Neutro Abs: 9.4 10*3/uL — ABNORMAL HIGH (ref 1.5–6.5)
PLATELETS: 203 10*3/uL (ref 145–400)
RBC: 5.31 MIL/uL (ref 3.70–5.32)
RDW: 14.2 % (ref 11.1–15.7)
WBC Count: 11.3 10*3/uL — ABNORMAL HIGH (ref 3.9–10.0)

## 2017-10-14 LAB — TECHNOLOGIST SMEAR REVIEW

## 2017-10-14 LAB — IRON AND TIBC
IRON: 69 ug/dL (ref 41–142)
Saturation Ratios: 24 % (ref 21–57)
TIBC: 285 ug/dL (ref 236–444)
UIBC: 216 ug/dL

## 2017-10-14 LAB — VITAMIN B12: Vitamin B-12: 480 pg/mL (ref 180–914)

## 2017-10-14 LAB — FERRITIN: FERRITIN: 1413 ng/mL — AB (ref 9–269)

## 2017-10-14 LAB — PLATELET BY CITRATE

## 2017-10-14 MED ORDER — CYANOCOBALAMIN 1000 MCG/ML IJ SOLN
1000.0000 ug | Freq: Once | INTRAMUSCULAR | Status: AC
  Administered 2017-10-14: 1000 ug via INTRAMUSCULAR

## 2017-10-14 NOTE — Progress Notes (Signed)
Pre visit review using our clinic tool,if applicable. No additional management support is needed unless otherwise documented below in the visit note.   Patient in for B12 injection and BP check per Elby Beck dated 09/15/17.  BP = 130/74 P= 85    Patient given 1000 mcg  B12 IM Left deltoid and patient  tolerated well.  Per M. O'Sullivan patient to continue medications as ordered and return for OV with her in 3 months. Scheduled appointment for Follow up and one in 1 month for B12 injection.

## 2017-10-14 NOTE — Progress Notes (Signed)
Hematology and Oncology Follow Up Visit  [name] [id number] 08-02-1955 62 y.o. 10/14/2017   Principle Diagnosis:  Iron deficiency anemia Pernicious anemia Gastric bypass with malabsorption  Current Therapy:   IV iron as indicated - last received in November 2018 Vitamin B-12 1 mg IM every month - with PCP   Interim History:  [name] is here today for follow-up.  Unfortunately, she still has not had surgery for the big toe on her left foot.  She has osteomyelitis.  Because of her work, she has not been able to have surgery.  It now is been set up for July 10.  Otherwise, she seems to be doing okay.  She is had no bleeding.  She is on Eliquis.  She gets vitamin B12 monthly from her primary care doctor.  We last saw her in March,  her iron studies showed a ferritin of 1358 with an iron saturation of 14%.   She still has some bruising.  This is because of the Eliquis.  There is no change in bowel or bladder habits.  She has had no cough.  There has been no nausea or vomiting.   Overall, I said that her performance status is ECOG 2.  Medications:  Allergies as of 10/14/2017      Reactions   Other Anaphylaxis   NUTS   Penicillins Other (See Comments)   Headache. Has patient had a PCN reaction causing immediate rash, facial/tongue/throat swelling, SOB or lightheadedness with hypotension: No Has patient had a PCN reaction causing severe rash involving mucus membranes or skin necrosis: No Has patient had a PCN reaction that required hospitalization: No Has patient had a PCN reaction occurring within the last 10 years: Yes If all of the above answers are "NO", then may proceed with Cephalosporin use.   Amoxicillin-pot Clavulanate Other (See Comments)   headache   Food Hives   Potato   Tomato Hives      Medication List        Accurate as of 10/14/17  9:52 AM. Always use your most recent med list.          acetaminophen 325 MG tablet Commonly known as:   TYLENOL Take 2 tablets (650 mg total) by mouth every 6 (six) hours as needed for mild pain or moderate pain.   albuterol 108 (90 Base) MCG/ACT inhaler Commonly known as:  PROVENTIL HFA;VENTOLIN HFA Inhale 2 puffs into the lungs every 6 (six) hours as needed for wheezing or shortness of breath.   apixaban 5 MG Tabs tablet Commonly known as:  ELIQUIS Take 5 mg by mouth 2 (two) times daily.   apixaban 5 MG Tabs tablet Commonly known as:  ELIQUIS Take 1 tablet (5 mg total) by mouth daily.   ELIQUIS 5 MG Tabs tablet Generic drug:  apixaban TAKE 1 TABLET BY MOUTH TWO  TIMES DAILY   calcitRIOL 0.5 MCG capsule Commonly known as:  ROCALTROL Take 2 mcg by mouth daily.   calcium carbonate 500 MG chewable tablet Commonly known as:  TUMS - dosed in mg elemental calcium Chew 2 tablets (400 mg of elemental calcium total) by mouth 4 (four) times daily.   citric acid-potassium citrate 1100-334 MG/5ML solution Commonly known as:  POLYCITRA Take 10 mEq by mouth daily.   desoximetasone 0.05 % cream Commonly known as:  TOPICORT Apply topically 2 (two) times daily as needed.   diltiazem 240 MG 24 hr capsule Commonly known as:  CARDIZEM CD TAKE 1 CAPSULE BY  MOUTH  DAILY   fluticasone 50 MCG/ACT nasal spray Commonly known as:  FLONASE Place 2 sprays into both nostrils daily as needed for allergies or rhinitis.   furosemide 40 MG tablet Commonly known as:  LASIX 2 tabs by mouth daily in the AM and 1 tablet in the PM   levocetirizine 5 MG tablet Commonly known as:  XYZAL TAKE 1 TABLET BY MOUTH  EVERY EVENING   levothyroxine 100 MCG tablet Commonly known as:  SYNTHROID, LEVOTHROID Take 350 mcg by mouth daily.   loratadine 10 MG tablet Commonly known as:  CLARITIN TK 1 T PO ONCE A DAY   losartan 100 MG tablet Commonly known as:  COZAAR Take 1 tablet (100 mg total) by mouth daily.   neomycin-bacitracin-polymyxin ointment Commonly known as:  NEOSPORIN Apply 1 application topically  as needed for wound care. apply to eye   nystatin cream Commonly known as:  MYCOSTATIN APPLY TO THE AFFECTED AREA TWICE DAILY FOR UP TO 7 DAYS   omeprazole 40 MG capsule Commonly known as:  PRILOSEC TAKE 1 CAPSULE BY MOUTH  DAILY   SYMBICORT 160-4.5 MCG/ACT inhaler Generic drug:  budesonide-formoterol INHALE 2 PUFFS TWO TIMES  DAILY   traMADol 50 MG tablet Commonly known as:  ULTRAM Take 2 tablets (100 mg total) by mouth every 8 (eight) hours as needed for moderate pain.   VITAMIN B-12 IJ Inject as directed every 30 (thirty) days.   Vitamin D (Ergocalciferol) 50000 units Caps capsule Commonly known as:  DRISDOL TAKE 1 CAPSULE BY MOUTH 3  TIMES WEEKLY   zafirlukast 20 MG tablet Commonly known as:  ACCOLATE TAKE 1 TABLET BY MOUTH TWO  TIMES DAILY BEFORE MEALS       Allergies:  Allergies  Allergen Reactions  . Other Anaphylaxis    NUTS  . Penicillins Other (See Comments)    Headache. Has patient had a PCN reaction causing immediate rash, facial/tongue/throat swelling, SOB or lightheadedness with hypotension: No Has patient had a PCN reaction causing severe rash involving mucus membranes or skin necrosis: No Has patient had a PCN reaction that required hospitalization: No Has patient had a PCN reaction occurring within the last 10 years: Yes If all of the above answers are "NO", then may proceed with Cephalosporin use.   [name] Amoxicillin-Pot Clavulanate Other (See Comments)    headache  . Food Hives    Potato  . Tomato Hives    Past Medical History, Surgical history, Social history, and Family History were reviewed and updated.  Review of Systems: Review of Systems  Constitutional: Negative.   HENT: Negative.   Eyes: Negative.   Respiratory: Negative.   Cardiovascular: Negative.   Gastrointestinal: Negative.   Genitourinary: Negative.   Musculoskeletal: Positive for joint pain.  Skin: Negative.   Neurological: Negative.   Endo/Heme/Allergies: Bruises/bleeds  easily.  Psychiatric/Behavioral: Negative.      Physical Exam:  weight is 324 lb (147 kg) (abnormal). Her oral temperature is 98.2 F (36.8 C). Her blood pressure is 150/55 (abnormal) and her pulse is 82. Her respiration is 20 and oxygen saturation is 97%.   Wt Readings from Last 3 Encounters:  10/14/17 (!) 324 lb (147 kg)  09/30/17 (!) 326 lb 3.2 oz (148 kg)  08/28/17 (!) 322 lb (146.1 kg)    Physical Exam  Constitutional: She is oriented to person, place, and time.  HENT:  Head: Normocephalic and atraumatic.  Mouth/Throat: Oropharynx is clear and moist.  Eyes: Pupils are equal, round, and reactive  to light. EOM are normal.  Neck: Normal range of motion.  Cardiovascular: Normal rate, regular rhythm and normal heart sounds.  Pulmonary/Chest: Effort normal and breath sounds normal.  Abdominal: Soft. Bowel sounds are normal.  Musculoskeletal: Normal range of motion. She exhibits no edema, tenderness or deformity.  Lymphadenopathy:    She has no cervical adenopathy.  Neurological: She is alert and oriented to person, place, and time.  Skin: Skin is warm and dry. No rash noted. No erythema.  Psychiatric: She has a normal mood and affect. Her behavior is normal. Judgment and thought content normal.  Vitals reviewed.   Lab Results  Component Value Date   WBC 11.3 (H) 10/14/2017   HGB 15.2 10/14/2017   HCT 46.5 10/14/2017   MCV 87.6 10/14/2017   PLT 203 10/14/2017   Lab Results  Component Value Date   FERRITIN 1,358 (H) 07/27/2017   IRON 38 (L) 07/27/2017   TIBC 265 07/27/2017   UIBC 227 07/27/2017   IRONPCTSAT 14 (L) 07/27/2017   Lab Results  Component Value Date   RETICCTPCT 2.2 (H) 07/27/2017   RBC 5.31 10/14/2017   RETICCTABS 131.4 03/30/2015   No results found for: [facility]ils P[facility] Lab Results  Component Value Date   IGGSERUM 1250 06/09/2007   IGA 334 06/09/2007   IGMSERUM 244 06/09/2007   Lab Results  Component Value Date    TOTALPROTELP 7.0 06/09/2007     Chemistry      Component Value Date/Time   NA 142 09/04/2017 0859   NA 144 03/30/2017 1455   NA 139 07/30/2015 1302   K 4.0 09/04/2017 0859   K 3.7 03/30/2017 1455   K 3.5 07/30/2015 1302   CL 99 09/04/2017 0859   CL 99 03/30/2017 1455   CO2 33 (H) 09/04/2017 0859   CO2 30 03/30/2017 1455   CO2 25 07/30/2015 1302   BUN 16 09/04/2017 0859   BUN 14 03/30/2017 1455   BUN 16.8 07/30/2015 1302   CREATININE 0.76 09/04/2017 0859   CREATININE 0.90 07/27/2017 1451   CREATININE 0.8 03/30/2017 1455   CREATININE 0.8 07/30/2015 1302      Component Value Date/Time   CALCIUM 7.8 (L) 09/04/2017 0859   CALCIUM 8.7 03/30/2017 1455   CALCIUM 8.8 07/30/2015 1302   ALKPHOS 114 (H) 07/27/2017 1451   ALKPHOS 107 (H) 03/30/2017 1455   ALKPHOS 99 07/30/2015 1302   AST 21 07/27/2017 1451   AST 36 (H) 07/30/2015 1302   ALT 28 07/27/2017 1451   ALT 28 03/30/2017 1455   ALT 68 (H) 07/30/2015 1302   BILITOT 0.7 07/27/2017 1451   BILITOT 0.41 07/30/2015 1302      Impression and Plan: [name] is a very pleasant 62 yo caucasian female with iron deficiency anemia secondary to malabsorption after gastric bypass. She also has pernicious anemia and receives monthly B 12 injections with her PCP.   I just feel bad that she is going to need to have her big toe taken off.  It sounds like she has osteomyelitis.  I do not see any problems with her having this surgery.  She is on Eliquis so we will have to make sure she is off Eliquis for a couple days beforehand.  For right now, I would like to see her back in about 4 months.     Volanda Napoleon, MD 5/22/20199:52 AM

## 2017-10-14 NOTE — Telephone Encounter (Signed)
Advise if she is to continue on this amount

## 2017-11-02 ENCOUNTER — Ambulatory Visit: Payer: 59 | Admitting: Podiatry

## 2017-11-02 DIAGNOSIS — J301 Allergic rhinitis due to pollen: Secondary | ICD-10-CM | POA: Diagnosis not present

## 2017-11-02 DIAGNOSIS — J4540 Moderate persistent asthma, uncomplicated: Secondary | ICD-10-CM | POA: Diagnosis not present

## 2017-11-02 DIAGNOSIS — R21 Rash and other nonspecific skin eruption: Secondary | ICD-10-CM | POA: Diagnosis not present

## 2017-11-02 DIAGNOSIS — J3089 Other allergic rhinitis: Secondary | ICD-10-CM | POA: Diagnosis not present

## 2017-11-17 ENCOUNTER — Ambulatory Visit: Payer: 59 | Admitting: Podiatry

## 2017-11-17 ENCOUNTER — Encounter: Payer: Self-pay | Admitting: Podiatry

## 2017-11-17 ENCOUNTER — Ambulatory Visit (INDEPENDENT_AMBULATORY_CARE_PROVIDER_SITE_OTHER): Payer: 59

## 2017-11-17 DIAGNOSIS — M2030 Hallux varus (acquired), unspecified foot: Secondary | ICD-10-CM

## 2017-11-17 DIAGNOSIS — M2031 Hallux varus (acquired), right foot: Secondary | ICD-10-CM | POA: Diagnosis not present

## 2017-11-17 DIAGNOSIS — Z7901 Long term (current) use of anticoagulants: Secondary | ICD-10-CM | POA: Diagnosis not present

## 2017-11-17 DIAGNOSIS — Q828 Other specified congenital malformations of skin: Secondary | ICD-10-CM | POA: Diagnosis not present

## 2017-11-17 DIAGNOSIS — E538 Deficiency of other specified B group vitamins: Secondary | ICD-10-CM

## 2017-11-17 MED ORDER — CYANOCOBALAMIN 1000 MCG/ML IJ SOLN
1000.0000 ug | Freq: Once | INTRAMUSCULAR | Status: AC
  Administered 2017-11-17: 1000 ug via INTRAMUSCULAR

## 2017-11-17 MED ORDER — TRAMADOL HCL 50 MG PO TABS: 50.0000 mg | ORAL_TABLET | Freq: Two times a day (BID) | ORAL | 0 refills | Status: DC | PRN

## 2017-11-17 NOTE — Progress Notes (Signed)
Subjective: 62 year old female presents the office today for surgical consultation due to the pain in both of her feet.  She states that today the pain she states that constantly rubs inside her shoes and this is an ongoing issue for several years and is getting worse at this time she was to proceed with surgical interventionmon the right side. Denies any systemic complaints such as fevers, chills, nausea, vomiting. No acute changes since last appointment, and no other complaints at this time.   Objective: AAO x3, NAD DP/PT pulses palpable bilaterally, CRT less than 3 seconds Hallux varus is present bilaterally however there is more tenderness palpation on the right side.  It is somewhat reducible to a rectus position but not able to get fully rectus.  On the left side there is hallux malleus present with the hallux varus.  There is irritation on the medial aspect of the right hallux however there is no skin breakdown. Mild chronic swelling bilateral feet.  Hyperkeratotic lesions present left foot submetatarsal x2.  Upon debridement no underlying ulceration drainage or any signs of infection. No open lesions or pre-ulcerative lesions.  No pain with calf compression, swelling, warmth, erythema  Assessment: Hallux varus, symptomatic right side  Plan: -All treatment options discussed with the patient including all alternatives, risks, complications.  -I reviewed the x-rays with her.  We discussed both conservative as well as surgical treatment options.  At this time she wished to proceed with surgical intervention.  Discussed the first MTPJ arthrodesis with plate, screw fixation.  We discussed the surgery as well as the postoperative course.  Discussed this is an outpatient surgery she needs to be nonweightbearing afterwards.  She states that she has family to help her with this she also has a wheelchair that she will get.  Also discussed Dr. Jacalyn Lefevre recommendations to stop her anticoagulation 2 days  before surgery.  We discussed risks and complications of the surgery and she wishes to proceed. The incision placement as well as the postoperative course was discussed with the patient. I discussed risks of the surgery which include, but not limited to, infection, bleeding, pain, swelling, need for further surgery, delayed or nonhealing, painful or ugly scar, numbness or sensation changes, over/under correction, recurrence, transfer lesions, further deformity, hardware failure, DVT/PE, loss of toe/foot. Patient understands these risks and wishes to proceed with surgery. The surgical consent was reviewed with the patient all 3 pages were signed. No promises or guarantees were given to the outcome of the procedure. All questions were answered to the best of my ability. Before the surgery the patient was encouraged to call the office if there is any further questions. The surgery will be performed at the [facility] on an outpatient basis. -We will order arterial studies prior to surgery. -We will try to get home health to help her postoperatively. -CAM boot dispensed for postop use.  -Also debrided the hyperkeratotic lesion that was symptomatic left foot submetatarsal area any complications or bleeding. -Patient encouraged to call the office with any questions, concerns, change in symptoms.   Trula Slade DPM

## 2017-11-17 NOTE — Patient Instructions (Signed)

## 2017-11-19 ENCOUNTER — Other Ambulatory Visit: Payer: Self-pay | Admitting: *Deleted

## 2017-11-19 DIAGNOSIS — M2030 Hallux varus (acquired), unspecified foot: Secondary | ICD-10-CM

## 2017-11-19 DIAGNOSIS — R0989 Other specified symptoms and signs involving the circulatory and respiratory systems: Secondary | ICD-10-CM

## 2017-11-20 ENCOUNTER — Telehealth: Payer: Self-pay | Admitting: *Deleted

## 2017-11-20 NOTE — Telephone Encounter (Signed)
I left the patient a message to hold her Apixaban two days prior to her procedure date per Dr. Stanford Breed.  You are to resume it after your procedure if it's okay.  I asked her to call is she has any questions.

## 2017-11-21 ENCOUNTER — Other Ambulatory Visit: Payer: Self-pay | Admitting: Family

## 2017-11-24 ENCOUNTER — Inpatient Hospital Stay (HOSPITAL_COMMUNITY)
Admission: EM | Admit: 2017-11-24 | Discharge: 2017-11-29 | DRG: 853 | Disposition: A | Discharge status: Home Health | Payer: 59 | Attending: Internal Medicine | Admitting: Internal Medicine

## 2017-11-24 ENCOUNTER — Other Ambulatory Visit: Payer: Self-pay

## 2017-11-24 ENCOUNTER — Encounter (HOSPITAL_COMMUNITY): Payer: Self-pay | Admitting: Emergency Medicine

## 2017-11-24 ENCOUNTER — Emergency Department (HOSPITAL_COMMUNITY): Payer: 59

## 2017-11-24 DIAGNOSIS — Z6841 Body Mass Index (BMI) 40.0 and over, adult: Secondary | ICD-10-CM | POA: Diagnosis not present

## 2017-11-24 DIAGNOSIS — Z7951 Long term (current) use of inhaled steroids: Secondary | ICD-10-CM | POA: Diagnosis not present

## 2017-11-24 DIAGNOSIS — L03312 Cellulitis of back [any part except buttock]: Secondary | ICD-10-CM | POA: Diagnosis present

## 2017-11-24 DIAGNOSIS — J45909 Unspecified asthma, uncomplicated: Secondary | ICD-10-CM | POA: Diagnosis present

## 2017-11-24 DIAGNOSIS — R319 Hematuria, unspecified: Secondary | ICD-10-CM | POA: Diagnosis not present

## 2017-11-24 DIAGNOSIS — I480 Paroxysmal atrial fibrillation: Secondary | ICD-10-CM | POA: Diagnosis not present

## 2017-11-24 DIAGNOSIS — N390 Urinary tract infection, site not specified: Secondary | ICD-10-CM | POA: Diagnosis not present

## 2017-11-24 DIAGNOSIS — N201 Calculus of ureter: Secondary | ICD-10-CM

## 2017-11-24 DIAGNOSIS — W19XXXA Unspecified fall, initial encounter: Secondary | ICD-10-CM | POA: Diagnosis not present

## 2017-11-24 DIAGNOSIS — Z880 Allergy status to penicillin: Secondary | ICD-10-CM

## 2017-11-24 DIAGNOSIS — G92 Toxic encephalopathy: Secondary | ICD-10-CM | POA: Diagnosis not present

## 2017-11-24 DIAGNOSIS — I4891 Unspecified atrial fibrillation: Secondary | ICD-10-CM | POA: Diagnosis present

## 2017-11-24 DIAGNOSIS — B370 Candidal stomatitis: Secondary | ICD-10-CM | POA: Diagnosis not present

## 2017-11-24 DIAGNOSIS — E039 Hypothyroidism, unspecified: Secondary | ICD-10-CM | POA: Diagnosis present

## 2017-11-24 DIAGNOSIS — D696 Thrombocytopenia, unspecified: Secondary | ICD-10-CM | POA: Diagnosis present

## 2017-11-24 DIAGNOSIS — I959 Hypotension, unspecified: Secondary | ICD-10-CM | POA: Diagnosis not present

## 2017-11-24 DIAGNOSIS — I10 Essential (primary) hypertension: Secondary | ICD-10-CM | POA: Diagnosis present

## 2017-11-24 DIAGNOSIS — L03119 Cellulitis of unspecified part of limb: Secondary | ICD-10-CM | POA: Diagnosis present

## 2017-11-24 DIAGNOSIS — Z881 Allergy status to other antibiotic agents status: Secondary | ICD-10-CM

## 2017-11-24 DIAGNOSIS — K219 Gastro-esophageal reflux disease without esophagitis: Secondary | ICD-10-CM | POA: Diagnosis present

## 2017-11-24 DIAGNOSIS — A4151 Sepsis due to Escherichia coli [E. coli]: Principal | ICD-10-CM | POA: Diagnosis present

## 2017-11-24 DIAGNOSIS — R000 Tachycardia, unspecified: Secondary | ICD-10-CM | POA: Diagnosis not present

## 2017-11-24 DIAGNOSIS — N132 Hydronephrosis with renal and ureteral calculous obstruction: Secondary | ICD-10-CM | POA: Diagnosis not present

## 2017-11-24 DIAGNOSIS — M797 Fibromyalgia: Secondary | ICD-10-CM | POA: Diagnosis present

## 2017-11-24 DIAGNOSIS — N200 Calculus of kidney: Secondary | ICD-10-CM | POA: Diagnosis not present

## 2017-11-24 DIAGNOSIS — Z7901 Long term (current) use of anticoagulants: Secondary | ICD-10-CM

## 2017-11-24 DIAGNOSIS — Z79899 Other long term (current) drug therapy: Secondary | ICD-10-CM | POA: Diagnosis not present

## 2017-11-24 DIAGNOSIS — R531 Weakness: Secondary | ICD-10-CM | POA: Diagnosis not present

## 2017-11-24 DIAGNOSIS — E890 Postprocedural hypothyroidism: Secondary | ICD-10-CM | POA: Diagnosis not present

## 2017-11-24 DIAGNOSIS — Z8542 Personal history of malignant neoplasm of other parts of uterus: Secondary | ICD-10-CM

## 2017-11-24 DIAGNOSIS — K760 Fatty (change of) liver, not elsewhere classified: Secondary | ICD-10-CM | POA: Diagnosis not present

## 2017-11-24 DIAGNOSIS — Z87891 Personal history of nicotine dependence: Secondary | ICD-10-CM

## 2017-11-24 DIAGNOSIS — Z85850 Personal history of malignant neoplasm of thyroid: Secondary | ICD-10-CM

## 2017-11-24 DIAGNOSIS — N136 Pyonephrosis: Secondary | ICD-10-CM | POA: Diagnosis not present

## 2017-11-24 DIAGNOSIS — R7881 Bacteremia: Secondary | ICD-10-CM

## 2017-11-24 DIAGNOSIS — E8770 Fluid overload, unspecified: Secondary | ICD-10-CM | POA: Diagnosis present

## 2017-11-24 DIAGNOSIS — R21 Rash and other nonspecific skin eruption: Secondary | ICD-10-CM | POA: Diagnosis present

## 2017-11-24 DIAGNOSIS — N159 Renal tubulo-interstitial disease, unspecified: Secondary | ICD-10-CM | POA: Diagnosis not present

## 2017-11-24 DIAGNOSIS — R05 Cough: Secondary | ICD-10-CM | POA: Diagnosis not present

## 2017-11-24 DIAGNOSIS — A419 Sepsis, unspecified organism: Secondary | ICD-10-CM | POA: Diagnosis not present

## 2017-11-24 DIAGNOSIS — Z89421 Acquired absence of other right toe(s): Secondary | ICD-10-CM

## 2017-11-24 DIAGNOSIS — R509 Fever, unspecified: Secondary | ICD-10-CM | POA: Diagnosis not present

## 2017-11-24 DIAGNOSIS — Z9884 Bariatric surgery status: Secondary | ICD-10-CM

## 2017-11-24 LAB — CBC WITH DIFFERENTIAL/PLATELET
BASOS ABS: 0 10*3/uL (ref 0.0–0.1)
Basophils Relative: 0 %
EOS PCT: 0 %
Eosinophils Absolute: 0 10*3/uL (ref 0.0–0.7)
HEMATOCRIT: 44 % (ref 36.0–46.0)
HEMOGLOBIN: 13.9 g/dL (ref 12.0–15.0)
LYMPHS ABS: 0.4 10*3/uL — AB (ref 0.7–4.0)
Lymphocytes Relative: 2 %
MCH: 28.6 pg (ref 26.0–34.0)
MCHC: 31.6 g/dL (ref 30.0–36.0)
MCV: 90.5 fL (ref 78.0–100.0)
Monocytes Absolute: 0.4 10*3/uL (ref 0.1–1.0)
Monocytes Relative: 2 %
NEUTROS ABS: 21.1 10*3/uL — AB (ref 1.7–7.7)
Neutrophils Relative %: 96 %
Platelets: DECREASED 10*3/uL (ref 150–400)
RBC: 4.86 MIL/uL (ref 3.87–5.11)
RDW: 15 % (ref 11.5–15.5)
WBC: 21.9 10*3/uL — ABNORMAL HIGH (ref 4.0–10.5)

## 2017-11-24 LAB — COMPREHENSIVE METABOLIC PANEL
ALBUMIN: 3.1 g/dL — AB (ref 3.5–5.0)
ALT: 37 U/L (ref 0–44)
AST: 27 U/L (ref 15–41)
Alkaline Phosphatase: 102 U/L (ref 38–126)
Anion gap: 17 — ABNORMAL HIGH (ref 5–15)
BILIRUBIN TOTAL: 1.1 mg/dL (ref 0.3–1.2)
BUN: 23 mg/dL — AB (ref 6–20)
CHLORIDE: 100 mmol/L (ref 98–111)
CO2: 23 mmol/L (ref 22–32)
CREATININE: 1.2 mg/dL — AB (ref 0.44–1.00)
Calcium: 7.5 mg/dL — ABNORMAL LOW (ref 8.9–10.3)
GFR calc Af Amer: 56 mL/min — ABNORMAL LOW (ref >60)
GFR, EST NON AFRICAN AMERICAN: 48 mL/min — AB (ref >60)
GLUCOSE: 104 mg/dL — AB (ref 70–99)
Potassium: 3.3 mmol/L — ABNORMAL LOW (ref 3.5–5.1)
Sodium: 140 mmol/L (ref 135–145)
TOTAL PROTEIN: 6 g/dL — AB (ref 6.5–8.1)

## 2017-11-24 LAB — URINALYSIS, ROUTINE W REFLEX MICROSCOPIC
Bilirubin Urine: NEGATIVE
GLUCOSE, UA: NEGATIVE mg/dL
KETONES UR: NEGATIVE mg/dL
NITRITE: POSITIVE — AB
PROTEIN: NEGATIVE mg/dL
RBC / HPF: >50 RBC/hpf — ABNORMAL HIGH (ref 0–5)
Specific Gravity, Urine: 1.012 (ref 1.005–1.030)
pH: 6 (ref 5.0–8.0)

## 2017-11-24 LAB — I-STAT CG4 LACTIC ACID, ED
LACTIC ACID, VENOUS: 3.97 mmol/L — AB (ref 0.5–1.9)
Lactic Acid, Venous: 3.02 mmol/L (ref 0.5–1.9)

## 2017-11-24 LAB — LACTIC ACID, PLASMA: LACTIC ACID, VENOUS: 3.1 mmol/L — AB (ref 0.5–1.9)

## 2017-11-24 MED ORDER — ACETAMINOPHEN 325 MG PO TABS: 650.0000 mg | ORAL_TABLET | Freq: Four times a day (QID) | ORAL | Status: DC | PRN

## 2017-11-24 MED ORDER — ENOXAPARIN SODIUM 40 MG/0.4ML ~~LOC~~ SOLN: 40.0000 mg | SUBCUTANEOUS | Status: DC

## 2017-11-24 MED ORDER — VANCOMYCIN HCL IN DEXTROSE 1-5 GM/200ML-% IV SOLN: 1000.0000 mg | Freq: Once | INTRAVENOUS | Status: DC

## 2017-11-24 MED ORDER — LORATADINE 10 MG PO TABS
10.0000 mg | ORAL_TABLET | Freq: Every evening | ORAL | Status: DC
  Administered 2017-11-25 – 2017-11-28 (×5): 10 mg via ORAL
  Filled 2017-11-24 (×5): qty 1

## 2017-11-24 MED ORDER — ALBUTEROL SULFATE HFA 108 (90 BASE) MCG/ACT IN AERS
2.0000 | INHALATION_SPRAY | Freq: Four times a day (QID) | RESPIRATORY_TRACT | Status: DC | PRN
  Filled 2017-11-24: qty 6.7

## 2017-11-24 MED ORDER — ALBUTEROL SULFATE (2.5 MG/3ML) 0.083% IN NEBU
3.0000 mL | INHALATION_SOLUTION | Freq: Four times a day (QID) | RESPIRATORY_TRACT | Status: DC | PRN
  Administered 2017-11-25 – 2017-11-27 (×5): 3 mL via RESPIRATORY_TRACT
  Filled 2017-11-24 (×5): qty 3

## 2017-11-24 MED ORDER — ACETAMINOPHEN 325 MG PO TABS
650.0000 mg | ORAL_TABLET | Freq: Once | ORAL | Status: AC
Start: 2017-11-24 — End: 2017-11-24
  Administered 2017-11-24: 650 mg via ORAL
  Filled 2017-11-24: qty 2

## 2017-11-24 MED ORDER — PANTOPRAZOLE SODIUM 40 MG PO TBEC
40.0000 mg | DELAYED_RELEASE_TABLET | Freq: Every day | ORAL | Status: DC
  Administered 2017-11-25 – 2017-11-29 (×6): 40 mg via ORAL
  Filled 2017-11-24 (×6): qty 1

## 2017-11-24 MED ORDER — MOMETASONE FURO-FORMOTEROL FUM 200-5 MCG/ACT IN AERO
2.0000 | INHALATION_SPRAY | Freq: Two times a day (BID) | RESPIRATORY_TRACT | Status: DC
  Administered 2017-11-25 – 2017-11-29 (×8): 2 via RESPIRATORY_TRACT
  Filled 2017-11-24: qty 8.8

## 2017-11-24 MED ORDER — ONDANSETRON HCL 4 MG/2ML IJ SOLN: 4.0000 mg | Freq: Four times a day (QID) | INTRAMUSCULAR | Status: DC | PRN

## 2017-11-24 MED ORDER — POTASSIUM CITRATE-CITRIC ACID 1100-334 MG/5ML PO SOLN: 10.0000 meq | Freq: Every day | ORAL | Status: DC

## 2017-11-24 MED ORDER — CEFEPIME HCL 2 G IJ SOLR
2.0000 g | Freq: Two times a day (BID) | INTRAMUSCULAR | Status: DC
  Administered 2017-11-25: 2 g via INTRAVENOUS
  Filled 2017-11-24 (×3): qty 2

## 2017-11-24 MED ORDER — CALCITRIOL 0.5 MCG PO CAPS
2.0000 ug | ORAL_CAPSULE | Freq: Every day | ORAL | Status: DC
  Administered 2017-11-25 – 2017-11-29 (×5): 2 ug via ORAL
  Filled 2017-11-24 (×3): qty 4
  Filled 2017-11-24: qty 8
  Filled 2017-11-24: qty 4

## 2017-11-24 MED ORDER — SODIUM CHLORIDE 0.9 % IV BOLUS: 2000.0000 mL | Freq: Once | INTRAVENOUS | Status: DC

## 2017-11-24 MED ORDER — VANCOMYCIN HCL 10 G IV SOLR
2500.0000 mg | Freq: Once | INTRAVENOUS | Status: AC
  Administered 2017-11-24: 2500 mg via INTRAVENOUS
  Filled 2017-11-24: qty 2500

## 2017-11-24 MED ORDER — FLUCONAZOLE IN SODIUM CHLORIDE 400-0.9 MG/200ML-% IV SOLN
400.0000 mg | INTRAVENOUS | Status: DC
  Administered 2017-11-25 (×2): 400 mg via INTRAVENOUS
  Filled 2017-11-24 (×2): qty 200

## 2017-11-24 MED ORDER — APIXABAN 5 MG PO TABS
5.0000 mg | ORAL_TABLET | Freq: Two times a day (BID) | ORAL | Status: DC
  Administered 2017-11-25: 5 mg via ORAL
  Filled 2017-11-24 (×2): qty 1

## 2017-11-24 MED ORDER — PIPERACILLIN-TAZOBACTAM 3.375 G IVPB: 3.3750 g | Freq: Three times a day (TID) | INTRAVENOUS | Status: DC

## 2017-11-24 MED ORDER — PIPERACILLIN-TAZOBACTAM 3.375 G IVPB 30 MIN
3.3750 g | Freq: Once | INTRAVENOUS | Status: AC
  Administered 2017-11-24: 3.375 g via INTRAVENOUS
  Filled 2017-11-24: qty 50

## 2017-11-24 MED ORDER — VANCOMYCIN HCL 10 G IV SOLR
1250.0000 mg | Freq: Two times a day (BID) | INTRAVENOUS | Status: DC
  Administered 2017-11-25: 1250 mg via INTRAVENOUS
  Filled 2017-11-24 (×2): qty 1250

## 2017-11-24 MED ORDER — LEVOTHYROXINE SODIUM 100 MCG PO TABS
300.0000 ug | ORAL_TABLET | Freq: Every day | ORAL | Status: DC
  Administered 2017-11-25 – 2017-11-29 (×5): 300 ug via ORAL
  Filled 2017-11-24 (×2): qty 2
  Filled 2017-11-24 (×2): qty 3
  Filled 2017-11-24: qty 6
  Filled 2017-11-24 (×2): qty 3

## 2017-11-24 MED ORDER — ONDANSETRON HCL 4 MG PO TABS: 4.0000 mg | ORAL_TABLET | Freq: Four times a day (QID) | ORAL | Status: DC | PRN

## 2017-11-24 MED ORDER — DILTIAZEM HCL ER COATED BEADS 240 MG PO CP24
240.0000 mg | ORAL_CAPSULE | Freq: Every day | ORAL | Status: DC
  Administered 2017-11-25 – 2017-11-29 (×5): 240 mg via ORAL
  Filled 2017-11-24 (×5): qty 1

## 2017-11-24 MED ORDER — SODIUM CHLORIDE 0.9 % IV BOLUS
2000.0000 mL | Freq: Once | INTRAVENOUS | Status: AC
  Administered 2017-11-24: 2000 mL via INTRAVENOUS

## 2017-11-24 MED ORDER — DIPHENHYDRAMINE HCL 25 MG PO CAPS: 25.0000 mg | ORAL_CAPSULE | Freq: Three times a day (TID) | ORAL | Status: DC | PRN

## 2017-11-24 MED ORDER — SODIUM CHLORIDE 0.9 % IV SOLN
INTRAVENOUS | Status: DC
  Administered 2017-11-24 – 2017-11-26 (×5): via INTRAVENOUS

## 2017-11-24 MED ORDER — ACETAMINOPHEN 325 MG PO TABS
650.0000 mg | ORAL_TABLET | Freq: Once | ORAL | Status: AC
  Administered 2017-11-24: 650 mg via ORAL
  Filled 2017-11-24: qty 2

## 2017-11-24 MED ORDER — ONDANSETRON HCL 4 MG/2ML IJ SOLN
4.0000 mg | Freq: Once | INTRAMUSCULAR | Status: AC
  Administered 2017-11-24: 4 mg via INTRAVENOUS
  Filled 2017-11-24: qty 2

## 2017-11-24 MED ORDER — SODIUM CHLORIDE 0.9 % IV BOLUS
1000.0000 mL | Freq: Once | INTRAVENOUS | Status: AC
  Administered 2017-11-24: 1000 mL via INTRAVENOUS

## 2017-11-24 NOTE — ED Provider Notes (Signed)
62 year old female presents today with not feeling well, fever, leukocytosis and elevated lactic acid.  Signed out to me by Dr. Maryan Rued.  She is receiving IV fluid bolus.  Visions weight is 147 kg.  30 cc/kg 4410 cc.  At this time patient has received 3500 cc an additional 1 L is ordered.  She is received Zosyn and vancomycin.  She has not voided.  She continues hypotensive with a systolic blood pressure 86 initial lactic acid is elevated. 5:46 PM Will recheck after complete fluid bolus Patient awake alert and conversant. Blood cultures obtained Urine pending 7:08 PM With fluid bolus complete Blood pressure 91/50 Patient awake and alert and continues to feel improved.  She has not put out any urine at this point.  Plan Foley catheter to obtain urine and assess urine output. We will consult medicine for admission for sepsis Discussed with Dr. Laren Everts and he will see for admission  CRITICAL CARE Performed by: Pattricia Boss Total critical care time: 45 minutes Critical care time was exclusive of separately billable procedures and treating other patients. Critical care was necessary to treat or prevent imminent or life-threatening deterioration. Critical care was time spent personally by me on the following activities: development of treatment plan with patient and/or surrogate as well as nursing, discussions with consultants, evaluation of patient's response to treatment, examination of patient, obtaining history from patient or surrogate, ordering and performing treatments and interventions, ordering and review of laboratory studies, ordering and review of radiographic studies, pulse oximetry and re-evaluation of patient's condition.    Pattricia Boss, MD 11/24/17 2106

## 2017-11-24 NOTE — ED Triage Notes (Signed)
Patient arrived to ED via [facility] from home. Lives alone. EMS reports:  Family called reporting patient experiencing generalized weakness x 1 week. Poor oral intake. Vomited x 1. Uses walker to ambulate at home. EMS administered NS 1000 bolus and Zofran 4 mg. Hypotensive. 80/60. Improved to 97/52.

## 2017-11-24 NOTE — Progress Notes (Addendum)
Pharmacy Antibiotic Note  [name] is a 62 y.o. female admitted on 11/24/2017 with sepsis.  Pharmacy has been consulted for vancomycin and Zosyn dosing.  Has headache with amoxicillin but not worrisome for true allergy. Tmax of 100.8. SCr is up to 1.2, normalized CrCl ~60ml/min.  Plan: Give vancomycin 2.5g IV x 1, then start vancomycin 1,250mg  IV Q12h Start cefepime 2g IV Q12h Monitor clinical picture, renal function, VT prn F/U C&S, abx deescalation / LOT  ADDENDUM: Also to add fluconazole for esophageal candidiasis. Start fluconazole 400mg  IV Q24h x 14 days. Can switch to PO when diet started.   Height: 5\' 4"  (162.6 cm) Weight: (!) 326 lb (147.9 kg) IBW/kg (Calculated) : 54.7  Temp (24hrs), Avg:100 F (37.8 C), Min:99.2 F (37.3 C), Max:100.8 F (38.2 C)  Recent Labs  Lab 11/24/17 1445  LATICACIDVEN 3.97*    CrCl cannot be calculated (Patient's most recent lab result is older than the maximum 21 days allowed.).    Allergies  Allergen Reactions  . Other Anaphylaxis    NUTS  . Penicillins Other (See Comments)    Headache. Has patient had a PCN reaction causing immediate rash, facial/tongue/throat swelling, SOB or lightheadedness with hypotension: No Has patient had a PCN reaction causing severe rash involving mucus membranes or skin necrosis: No Has patient had a PCN reaction that required hospitalization: No Has patient had a PCN reaction occurring within the last 10 years: Yes If all of the above answers are "NO", then may proceed with Cephalosporin use.   [name] Amoxicillin-Pot Clavulanate Other (See Comments)    headache  . Food Hives    Potato  . Tomato Hives    Thank you for allowing pharmacy to be a part of this patient's care.  Reginia Naas 11/24/2017 2:58 PM

## 2017-11-24 NOTE — ED Notes (Signed)
Waiting for 2nd set of blood cultures to be drawn to start antibiotics.

## 2017-11-24 NOTE — ED Notes (Signed)
Portable chest x-ray at bedside at this time. 

## 2017-11-24 NOTE — H&P (Addendum)
[facility]                                                                                    Patient Demographics  [name], is a 62 y.o. female  CSN: [id number]  MRN: [id number]  DOB - [birth year]  Admit Date - 11/24/2017  Outpatient Primary MD for the patient is Debbrah Alar, NP   With History of -  Past Medical History:  Diagnosis Date  . Abnormal Pap smear    years ago/no biopsy  . Allergic rhinitis   . Anemia, iron deficiency   . Arthritis    back- lower  . Asthma   . Atrial fibrillation ([place]) August 2012   OFF XARELTO LAST MONTH DUE TO BLEEDING IN STOOL  . Bursitis of hip left  . Dysrhythmia    afib, followed by Dr. Stanford Breed   . Edema of both legs   . Fatty liver 01/07/2012  . Fibroid 1974   fibroid cyst on left fallopian tube  . Fibromyalgia   . Foot ulcer ([place])    AREA HEALED RIGHT FOOT  . GERD (gastroesophageal reflux disease)   . Headache(784.0)    occasional sinus headache   . History of blood transfusion JULY 2015  . Hypertension   . Hypoparathyroidism ([place]) 01/07/2011  . Hypothyroidism   . Kidney stone 09-22-15   passed on their own  . Morbid obesity ([place])   . Nephrolithiasis 2/16, 9/16   SEES DR Risa Grill  . Pernicious anemia 02/20/2014   followed by Debbrah Alar  . PONV (postoperative nausea and vomiting)   . Seizures ([place])    infancy secondary to fever  . Thyroid cancer ([place]) 2011   THYROIDECTOMY DONE  . Uterine cancer ([place]) 2014   Mirena IUD  . Vitamin D deficiency       Past Surgical History:  Procedure Laterality Date  . AMPUTATION Right 05/18/2014   Procedure: RIGHT FIFTH RAY AMPUTATION FOOT;  Surgeon: Wylene Simmer, MD;  Location: [facility];  Service: Orthopedics;  Laterality: Right;  . APPENDECTOMY    . BUNIONECTOMY     bilateral  . COLONOSCOPY WITH PROPOFOL N/A 02/02/2014   Procedure: COLONOSCOPY WITH PROPOFOL;  Surgeon: Irene Shipper, MD;  Location: [facility];  Service: Endoscopy;  Laterality: N/A;  .  cyst on ovary removed     . CYSTOSCOPY W/ RETROGRADES  03/03/2012   Procedure: CYSTOSCOPY WITH RETROGRADE PYELOGRAM;  Surgeon: Bernestine Amass, MD;  Location: [facility];  Service: Urology;  Laterality: Bilateral;  . CYSTOSCOPY WITH RETROGRADE PYELOGRAM, URETEROSCOPY AND STENT PLACEMENT Left 08/25/2012   Procedure: CYSTOSCOPY WITH RETROGRADE PYELOGRAM, URETEROSCOPY;  Surgeon: Bernestine Amass, MD;  Location: [facility];  Service: Urology;  Laterality: Left;  . DILATION AND CURETTAGE OF UTERUS N/A 02/21/2013   Procedure: DILATATION AND CURETTAGE;  Surgeon: Lyman Speller, MD;  Location: [facility];  Service: Gynecology;  Laterality: N/A;  . DILATION AND CURETTAGE OF UTERUS N/A 04/09/2015   Procedure: [place] IUD removal;  Surgeon: Megan Salon, MD;  Location: [facility];  Service: Gynecology;  Laterality: N/A;  Patient weight 307lbs  . ESOPHAGOGASTRODUODENOSCOPY (EGD) WITH PROPOFOL N/A 02/02/2014  Procedure: ESOPHAGOGASTRODUODENOSCOPY (EGD) WITH PROPOFOL;  Surgeon: Irene Shipper, MD;  Location: [facility];  Service: Endoscopy;  Laterality: N/A;  . GASTRIC BYPASS  1974  . HOLMIUM LASER APPLICATION Left 01/27/4966   Procedure: HOLMIUM LASER APPLICATION;  Surgeon: Bernestine Amass, MD;  Location: [facility];  Service: Urology;  Laterality: Left;  . LITHOTRIPSY  03/2012  . LITHOTRIPSY  2/16  . THYROIDECTOMY  05/15/2010  . TONSILLECTOMY AND ADENOIDECTOMY    . URETEROSCOPY  03/03/2012   Procedure: URETEROSCOPY;  Surgeon: Bernestine Amass, MD;  Location: [facility];  Service: Urology;  Laterality: Left;    in for   Chief Complaint  Patient presents with  . Weakness  . Hypotension     HPI  [name]  is a 62 y.o. female, [name] , HTN and morbid obesity presenting acute onset of altered mental status with fever at home.  She has been gradually getting weak however today the patient family noticed extreme confusion with nausea and vomiting and fever.  Patient received IV fluids in  the emergency room and improved and her mental status gradually improved as well.  Code sepsis was called and patient received IV antibiotics as well.  She is alert awake and can converse at this time.  She denies any preceding chest pains shortness of breath or urinary symptoms.  In the emergency room she had to be catheterized and the urine analysis was positive for UTI.    Review of Systems    In addition to the HPI above, No Headache, No changes with Vision or hearing, No problems swallowing food or Liquids, No Chest pain, Cough or Shortness of Breath, No Abdominal pain,  Bowel movements are regular, No Blood in stool or Urine, No dysuria, No new skin rashes or bruises, No new joints pains-aches,  No new weakness, tingling, numbness in any extremity, No recent weight gain or loss, No polyuria, polydypsia or polyphagia, No significant Mental Stressors.  A full 10 point Review of Systems was done, except as stated above, all other Review of Systems were negative.   Social History Social History   Tobacco Use  . Smoking status: Former Smoker    Packs/day: 0.50    Years: 25.00    Pack years: 12.50    Types: Cigarettes    Start date: 12/24/1970    Last attempt to quit: 05/27/1995    Years since quitting: 22.5  . Smokeless tobacco: Never Used  . Tobacco comment: quit smoking 19 years ago  Substance Use Topics  . Alcohol use: Yes    Alcohol/week: 0.0 oz    Comment: 1/2 glass per month     Family History Family History  Problem Relation Age of Onset  . Hypertension Father   . Diabetes Father   . Lung cancer Father   . Heart attack Father        MI at age 24  . Hypertension Mother   . Hyperthyroidism Mother   . Heart disease Mother   . Heart attack Mother        MI at age 51  . Asthma Brother   . Hypertension Brother        younger  . Heart disease Brother        older  . Colon cancer Neg Hx   . Esophageal cancer Neg Hx   . Stomach cancer Neg Hx   . Kidney  disease Neg Hx   . Liver disease Neg Hx  Prior to Admission medications   Medication Sig Start Date End Date Taking? Authorizing Provider  acetaminophen (TYLENOL) 325 MG tablet Take 2 tablets (650 mg total) by mouth every 6 (six) hours as needed for mild pain or moderate pain. 12/08/16  Yes Waynetta Pean, PA-C  albuterol (PROVENTIL HFA;VENTOLIN HFA) 108 (90 Base) MCG/ACT inhaler Inhale 2 puffs into the lungs every 6 (six) hours as needed for wheezing or shortness of breath. 11/10/16  Yes Debbrah Alar, NP  apixaban (ELIQUIS) 5 MG TABS tablet Take 5 mg by mouth 2 (two) times daily.   Yes [provider]  calcitRIOL (ROCALTROL) 0.5 MCG capsule Take 2 mcg by mouth daily.    Yes [provider]  calcium carbonate (TUMS - DOSED IN MG ELEMENTAL CALCIUM) 500 MG chewable tablet Chew 2 tablets (400 mg of elemental calcium total) by mouth 4 (four) times daily. 09/01/17  Yes Debbrah Alar, NP  citric acid-potassium citrate (POLYCITRA) 1100-334 MG/5ML solution Take 10 mEq by mouth daily. 02/17/17  Yes [provider]  Cyanocobalamin (VITAMIN B-12 IJ) Inject as directed every 30 (thirty) days.   Yes [provider]  desoximetasone (TOPICORT) 0.05 % cream Apply topically 2 (two) times daily as needed. 01/29/17  Yes Debbrah Alar, NP  diltiazem (CARDIZEM CD) 240 MG 24 hr capsule TAKE 1 CAPSULE BY MOUTH  DAILY 10/14/17  Yes Debbrah Alar, NP  diphenhydrAMINE (BENADRYL) 25 mg capsule Take 25 mg by mouth every 8 (eight) hours as needed for itching.   Yes [provider]  fluticasone (FLONASE) 50 MCG/ACT nasal spray Place 2 sprays into both nostrils daily as needed for allergies or rhinitis.    Yes [provider]  furosemide (LASIX) 40 MG tablet 2 tabs by mouth daily in the AM and 1 tablet in the PM Patient taking differently: Take 40-80 mg by mouth See admin instructions. 80mg  in the morning and 40mg  at night 09/09/17  Yes Debbrah Alar, NP  levocetirizine (XYZAL) 5 MG tablet TAKE 1 TABLET BY MOUTH  EVERY EVENING 08/21/17  Yes Debbrah Alar, NP  levothyroxine (SYNTHROID, LEVOTHROID) 100 MCG tablet Take 300 mcg by mouth daily.  04/10/16  Yes [provider]  losartan (COZAAR) 50 MG tablet TAKE 1 TABLET BY MOUTH  DAILY 11/23/17  Yes Debbrah Alar, NP  neomycin-bacitracin-polymyxin (NEOSPORIN) ointment Apply 1 application topically as needed for wound care. apply to eye   Yes [provider]  nystatin cream (MYCOSTATIN) APPLY TO THE AFFECTED AREA TWICE DAILY FOR UP TO 7 DAYS 09/10/17  Yes Debbrah Alar, NP  omeprazole (PRILOSEC) 40 MG capsule TAKE 1 CAPSULE BY MOUTH  DAILY 08/21/17  Yes Debbrah Alar, NP  SYMBICORT 160-4.5 MCG/ACT inhaler INHALE 2 PUFFS TWO TIMES  DAILY 09/16/17  Yes Debbrah Alar, NP  traMADol (ULTRAM) 50 MG tablet Take 1 tablet (50 mg total) by mouth every 12 (twelve) hours as needed. 11/17/17  Yes Trula Slade, DPM  Vitamin D, Ergocalciferol, (DRISDOL) 50000 units CAPS capsule TAKE 1 CAPSULE BY MOUTH 3  TIMES WEEKLY 10/15/17  Yes Debbrah Alar, NP  zafirlukast (ACCOLATE) 20 MG tablet TAKE 1 TABLET BY MOUTH TWO  TIMES DAILY BEFORE MEALS 09/16/17  Yes Debbrah Alar, NP    Allergies  Allergen Reactions  . Other Anaphylaxis    NUTS  . Penicillins Other (See Comments)    Headache. Has patient had a PCN reaction causing immediate rash, facial/tongue/throat swelling, SOB or lightheadedness with hypotension: No Has patient had a PCN reaction causing severe rash  involving mucus membranes or skin necrosis: No Has patient had a PCN reaction that required hospitalization: No Has patient had a PCN reaction occurring within the last 10 years: Yes If all of the above answers are "NO", then may proceed with Cephalosporin use.   [name] Amoxicillin-Pot Clavulanate Other (See Comments)    headache  . Food Hives    Potato  . Tomato Hives    Physical  Exam  Vitals  Blood pressure (!) 88/45, pulse 91, temperature 99.1 F (37.3 C), temperature source Rectal, resp. rate 20, height 5\' 4"  (1.626 m), weight (!) 147.9 kg (326 lb), last menstrual period 06/11/2012, SpO2 95 %.   1. General : tired, extremely pleasant  2. Normal affect and insight, Not Suicidal or Homicidal, Awake Alert, Oriented X 3.  3. No F.N deficits, patient moving all extremities.  4. Ears and Eyes appear Normal, Conjunctivae clear, PERRLA. Moist Oral Mucosa.  Oral thrush noted  5. Supple Neck, No JVD, No cervical lymphadenopathy appriciated, No Carotid Bruits.  6. Symmetrical Chest wall movement, Good air movement bilaterally, CTAB.  7. RRR, No Gallops, Rubs or Murmurs, No Parasternal Heave.  8. Positive Bowel Sounds, Abdomen Soft, Non tender, No organomegaly appriciated,No rebound -guarding or rigidity.  9.  No Cyanosis, Normal Skin Turgor, No Skin Rash or Bruise.  10. Good muscle tone, lower extremities with +1 edema and early stasis dermatitis bilaterally with chronic changes of arthritis and old surgical scars, status post right second toe amputation and left toe abnormalities.    Data Review  CBC Recent Labs  Lab 11/24/17 1431  WBC 21.9*  HGB 13.9  HCT 44.0  PLT PLATELET CLUMPS NOTED ON SMEAR, COUNT APPEARS DECREASED  MCV 90.5  MCH 28.6  MCHC 31.6  RDW 15.0  LYMPHSABS 0.4*  MONOABS 0.4  EOSABS 0.0  BASOSABS 0.0   ------------------------------------------------------------------------------------------------------------------  Chemistries  Recent Labs  Lab 11/24/17 1431  NA 140  K 3.3*  CL 100  CO2 23  GLUCOSE 104*  BUN 23*  CREATININE 1.20*  CALCIUM 7.5*  AST 27  ALT 37  ALKPHOS 102  BILITOT 1.1   ------------------------------------------------------------------------------------------------------------------ estimated creatinine clearance is 72.4 mL/min (A) (by C-G formula based on SCr of 1.2 mg/dL  (H)). ------------------------------------------------------------------------------------------------------------------ No results for input(s): TSH, T4TOTAL, T3FREE, THYROIDAB in the last 72 hours.  Invalid input(s): FREET3   Coagulation profile No results for input(s): INR, PROTIME in the last 168 hours. ------------------------------------------------------------------------------------------------------------------- No results for input(s): DDIMER in the last 72 hours. -------------------------------------------------------------------------------------------------------------------  Cardiac Enzymes No results for input(s): CKMB, TROPONINI, MYOGLOBIN in the last 168 hours.  Invalid input(s): CK ------------------------------------------------------------------------------------------------------------------ Invalid input(s): POCBNP   ---------------------------------------------------------------------------------------------------------------  Urinalysis    Component Value Date/Time   COLORURINE YELLOW 11/24/2017 1944   APPEARANCEUR CLOUDY (A) 11/24/2017 1944   LABSPEC 1.012 11/24/2017 1944   LABSPEC 1.010 06/09/2007 1500   PHURINE 6.0 11/24/2017 1944   GLUCOSEU NEGATIVE 11/24/2017 1944   GLUCOSEU NEGATIVE 03/19/2016 1030   HGBUR LARGE (A) 11/24/2017 1944   HGBUR negative 05/09/2010 1026   BILIRUBINUR NEGATIVE 11/24/2017 1944   BILIRUBINUR N 05/01/2016 1104   BILIRUBINUR Negative 06/09/2007 1500   KETONESUR NEGATIVE 11/24/2017 1944   PROTEINUR NEGATIVE 11/24/2017 1944   UROBILINOGEN negative 05/01/2016 1104   UROBILINOGEN 0.2 03/19/2016 1030   NITRITE POSITIVE (A) 11/24/2017 1944   LEUKOCYTESUR LARGE (A) 11/24/2017 1944   LEUKOCYTESUR Negative 06/09/2007 1500    ----------------------------------------------------------------------------------------------------------------   Imaging results:   Dg Chest Port 1 View  Result Date: 11/24/2017  CLINICAL DATA:   Cough and fever EXAM: PORTABLE CHEST 1 VIEW COMPARISON:  08/28/2017 chest radiograph. FINDINGS: In Stable cardiomediastinal silhouette with normal heart size. No pneumothorax. No pleural effusion. Lungs appear clear, with no acute consolidative airspace disease and no pulmonary edema. IMPRESSION: No active disease. Electronically Signed   By: Ilona Sorrel M.D.   On: 11/24/2017 13:42    My personal review of EKG: Sinus tach, 114 with nonspecific ST changes probably due to tachycardia  Assessment & Plan  1.  Sepsis 2.  UTI 3.  Urinary tension 4.  History of A. fib, severe deforming osteoarthritis of the lower extremities, fatty liver. 5.Oral thrush  Plan  Continue IV hydration IV antibiotics, vancomycin and cefepime Diflucan IV Follow cultures   DVT Prophylaxis Lovenox  AM Labs Ordered, also please review Full Orders  Family Communication: Admission, patients condition and plan of care including tests being ordered have been discussed with the patient and daughter who indicate understanding and agree with the plan and Code Status.  Code Status full  Disposition Plan: Home  Time spent in minutes : 46 minutes  Condition GUARDED   @SIGNATURE @

## 2017-11-24 NOTE — ED Provider Notes (Signed)
[facility] EMERGENCY DEPARTMENT Provider Note   CSN: [id number] Arrival date & time: 11/24/17  1314     History   Chief Complaint No chief complaint on file.   HPI [name] is a 62 y.o. female.  Patient is a 62 year old female with a history of atrial fibrillation on Xarelto, hypertension, hypoparathyroidism, morbid obesity, osteomyelitis who is presenting today with 1 week of not feeling well.  She states she has had gradually worsening weakness, over the last 24 hours is felt hot and cold and chilled.  Today she was able to get out of bed but felt very weak and almost fell.  She denies any new swelling of her legs or redness.  She had one episode of nausea and vomiting today after drinking coffee but denies chest pain, shortness of breath or abdominal pain.  She has had a mild cough over the last week but denies any sputum production.  She has had no recent medication changes and no bowel changes.  The history is provided by the patient.    Past Medical History:  Diagnosis Date  . Abnormal Pap smear    years ago/no biopsy  . Allergic rhinitis   . Anemia, iron deficiency   . Arthritis    back- lower  . Asthma   . Atrial fibrillation ([place]) August 2012   OFF XARELTO LAST MONTH DUE TO BLEEDING IN STOOL  . Bursitis of hip left  . Dysrhythmia    afib, followed by Dr. Stanford Breed   . Edema of both legs   . Fatty liver 01/07/2012  . Fibroid 1974   fibroid cyst on left fallopian tube  . Fibromyalgia   . Foot ulcer ([place])    AREA HEALED RIGHT FOOT  . GERD (gastroesophageal reflux disease)   . Headache(784.0)    occasional sinus headache   . History of blood transfusion JULY 2015  . Hypertension   . Hypoparathyroidism ([place]) 01/07/2011  . Hypothyroidism   . Kidney stone 10/07/2015   passed on their own  . Morbid obesity ([place])   . Nephrolithiasis 2/16, 9/16   SEES DR Risa Grill  . Pernicious anemia 02/20/2014   followed by Debbrah Alar  . PONV  (postoperative nausea and vomiting)   . Seizures ([place])    infancy secondary to fever  . Thyroid cancer ([place]) 2011   THYROIDECTOMY DONE  . Uterine cancer ([place]) 2014   Mirena IUD  . Vitamin D deficiency     Patient Active Problem List   Diagnosis Date Noted  . Hypokalemia 12/10/2016  . Hypocalcemia 12/10/2016  . Recurrent falls 12/09/2016  . Ureteral calculus, right 07/17/2014  . Osteomyelitis ([place]) 06/14/2014  . Pernicious anemia 02/20/2014  . Reflux esophagitis 02/02/2014  . Edema 11/23/2013  . Foot pain 03/31/2013  . Endometrial adenocarcinoma ([place]) 02/21/2013  . Trochanteric bursitis of left hip 09/13/2012  . Muscle cramps 08/12/2012  . Fatty liver 01/07/2012  . Mid back pain 09/30/2011  . Weight gain 07/02/2011  . Nonspecific abnormal unspecified cardiovascular function study 04/30/2011  . Hx of papillary thyroid carcinoma 04/07/2011  . Low back pain 04/01/2011  . Atrial fibrillation ([place]) 02/05/2011  . Hypoparathyroidism ([place]) 01/07/2011  . GERD 05/22/2009  . Leukocytosis 03/24/2009  . OBESITY, MORBID 12/20/2008  . RHINOSINUSITIS, RECURRENT 09/07/2008  . Vitamin D deficiency 05/10/2007  . Iron deficiency anemia secondary to malabsorption  05/10/2007  . Hypothyroidism 01/05/2007  . Essential hypertension 01/05/2007  . ALLERGIC RHINITIS 01/05/2007  . Asthma 01/05/2007  Past Surgical History:  Procedure Laterality Date  . AMPUTATION Right 05/18/2014   Procedure: RIGHT FIFTH RAY AMPUTATION FOOT;  Surgeon: Wylene Simmer, MD;  Location: [facility];  Service: Orthopedics;  Laterality: Right;  . APPENDECTOMY    . BUNIONECTOMY     bilateral  . COLONOSCOPY WITH PROPOFOL N/A 02/02/2014   Procedure: COLONOSCOPY WITH PROPOFOL;  Surgeon: Irene Shipper, MD;  Location: [facility];  Service: Endoscopy;  Laterality: N/A;  . cyst on ovary removed     . CYSTOSCOPY W/ RETROGRADES  03/03/2012   Procedure: CYSTOSCOPY WITH RETROGRADE PYELOGRAM;  Surgeon: Bernestine Amass, MD;  Location: [facility];  Service: Urology;  Laterality: Bilateral;  . CYSTOSCOPY WITH RETROGRADE PYELOGRAM, URETEROSCOPY AND STENT PLACEMENT Left 08/25/2012   Procedure: CYSTOSCOPY WITH RETROGRADE PYELOGRAM, URETEROSCOPY;  Surgeon: Bernestine Amass, MD;  Location: [facility];  Service: Urology;  Laterality: Left;  . DILATION AND CURETTAGE OF UTERUS N/A 02/21/2013   Procedure: DILATATION AND CURETTAGE;  Surgeon: Lyman Speller, MD;  Location: [facility];  Service: Gynecology;  Laterality: N/A;  . DILATION AND CURETTAGE OF UTERUS N/A 04/09/2015   Procedure: Dibble IUD removal;  Surgeon: Megan Salon, MD;  Location: [facility];  Service: Gynecology;  Laterality: N/A;  Patient weight 307lbs  . ESOPHAGOGASTRODUODENOSCOPY (EGD) WITH PROPOFOL N/A 02/02/2014   Procedure: ESOPHAGOGASTRODUODENOSCOPY (EGD) WITH PROPOFOL;  Surgeon: Irene Shipper, MD;  Location: [facility];  Service: Endoscopy;  Laterality: N/A;  . GASTRIC BYPASS  1974  . HOLMIUM LASER APPLICATION Left 05/31/1094   Procedure: HOLMIUM LASER APPLICATION;  Surgeon: Bernestine Amass, MD;  Location: [facility];  Service: Urology;  Laterality: Left;  . LITHOTRIPSY  03/2012  . LITHOTRIPSY  2/16  . THYROIDECTOMY  05/15/2010  . TONSILLECTOMY AND ADENOIDECTOMY    . URETEROSCOPY  03/03/2012   Procedure: URETEROSCOPY;  Surgeon: Bernestine Amass, MD;  Location: [facility];  Service: Urology;  Laterality: Left;     OB History    Gravida  0   Para  0   Term  0   Preterm  0   AB  0   Living  0     SAB  0   TAB  0   Ectopic  0   Multiple  0   Live Births               Home Medications    Prior to Admission medications   Medication Sig Start Date End Date Taking? Authorizing Provider  acetaminophen (TYLENOL) 325 MG tablet Take 2 tablets (650 mg total) by mouth every 6 (six) hours as needed for mild pain or moderate pain. 12/08/16   Waynetta Pean, PA-C  albuterol (PROVENTIL HFA;VENTOLIN HFA) 108 (90 Base) MCG/ACT inhaler Inhale 2 puffs into the  lungs every 6 (six) hours as needed for wheezing or shortness of breath. 11/10/16   Debbrah Alar, NP  apixaban (ELIQUIS) 5 MG TABS tablet Take 5 mg by mouth 2 (two) times daily.    [provider]  calcitRIOL (ROCALTROL) 0.5 MCG capsule Take 2 mcg by mouth daily.     [provider]  calcium carbonate (TUMS - DOSED IN MG ELEMENTAL CALCIUM) 500 MG chewable tablet Chew 2 tablets (400 mg of elemental calcium total) by mouth 4 (four) times daily. 09/01/17   Debbrah Alar, NP  citric acid-potassium citrate (POLYCITRA) 1100-334 MG/5ML solution Take 10 mEq by mouth daily. 02/17/17   [provider]  Cyanocobalamin (VITAMIN B-12 IJ) Inject  as directed every 30 (thirty) days.    [provider]  desoximetasone (TOPICORT) 0.05 % cream Apply topically 2 (two) times daily as needed. 01/29/17   Debbrah Alar, NP  diltiazem (CARDIZEM CD) 240 MG 24 hr capsule TAKE 1 CAPSULE BY MOUTH  DAILY 10/14/17   Debbrah Alar, NP  fluticasone (FLONASE) 50 MCG/ACT nasal spray Place 2 sprays into both nostrils daily as needed for allergies or rhinitis.     [provider]  furosemide (LASIX) 40 MG tablet 2 tabs by mouth daily in the AM and 1 tablet in the PM 09/09/17   Debbrah Alar, NP  levocetirizine (XYZAL) 5 MG tablet TAKE 1 TABLET BY MOUTH  EVERY EVENING 08/21/17   Debbrah Alar, NP  levothyroxine (SYNTHROID, LEVOTHROID) 100 MCG tablet Take 350 mcg by mouth daily.  04/10/16   [provider]  loratadine (CLARITIN) 10 MG tablet TK 1 T PO ONCE A DAY 07/08/17   [provider]  losartan (COZAAR) 50 MG tablet TAKE 1 TABLET BY MOUTH  DAILY 11/23/17   Debbrah Alar, NP  mometasone (ELOCON) 0.1 % cream  11/17/17   [provider]  neomycin-bacitracin-polymyxin (NEOSPORIN) ointment Apply 1 application topically as needed for wound care. apply to eye    [provider]  nystatin cream (MYCOSTATIN) APPLY TO THE AFFECTED AREA  TWICE DAILY FOR UP TO 7 DAYS 09/10/17   Debbrah Alar, NP  omeprazole (PRILOSEC) 40 MG capsule TAKE 1 CAPSULE BY MOUTH  DAILY 08/21/17   Debbrah Alar, NP  predniSONE (STERAPRED UNI-PAK 21 TAB) 10 MG (21) TBPK tablet TAPER DOSE FOR 6 DAYS UTD 11/09/17   [provider]  SYMBICORT 160-4.5 MCG/ACT inhaler INHALE 2 PUFFS TWO TIMES  DAILY 09/16/17   Debbrah Alar, NP  traMADol (ULTRAM) 50 MG tablet Take 1 tablet (50 mg total) by mouth every 12 (twelve) hours as needed. 11/17/17   Trula Slade, DPM  Vitamin D, Ergocalciferol, (DRISDOL) 50000 units CAPS capsule TAKE 1 CAPSULE BY MOUTH 3  TIMES WEEKLY 10/15/17   Debbrah Alar, NP  zafirlukast (ACCOLATE) 20 MG tablet TAKE 1 TABLET BY MOUTH TWO  TIMES DAILY BEFORE MEALS 09/16/17   Debbrah Alar, NP    Family History Family History  Problem Relation Age of Onset  . Hypertension Father   . Diabetes Father   . Lung cancer Father   . Heart attack Father        MI at age 33  . Hypertension Mother   . Hyperthyroidism Mother   . Heart disease Mother   . Heart attack Mother        MI at age 5  . Asthma Brother   . Hypertension Brother        younger  . Heart disease Brother        older  . Colon cancer Neg Hx   . Esophageal cancer Neg Hx   . Stomach cancer Neg Hx   . Kidney disease Neg Hx   . Liver disease Neg Hx     Social History Social History   Tobacco Use  . Smoking status: Former Smoker    Packs/day: 0.50    Years: 25.00    Pack years: 12.50    Types: Cigarettes    Start date: 12/24/1970    Last attempt to quit: 05/27/1995    Years since quitting: 22.5  . Smokeless tobacco: Never Used  . Tobacco comment: quit smoking 19 years ago  Substance Use Topics  . Alcohol  use: Yes    Alcohol/week: 0.0 oz    Comment: 1/2 glass per month  . Drug use: No     Allergies   Other; Penicillins; Amoxicillin-pot clavulanate; Food; and Tomato   Review of Systems Review of Systems  All other systems  reviewed and are negative.    Physical Exam Updated Vital Signs LMP 06/11/2012   Physical Exam  Constitutional: She is oriented to person, place, and time. She appears well-developed and well-nourished. No distress.  Morbidly obese  HENT:  Head: Normocephalic and atraumatic.  Mouth/Throat: Oropharynx is clear and moist.  Dry mucous membranes  Eyes: Pupils are equal, round, and reactive to light. Conjunctivae and EOM are normal.  Neck: Normal range of motion. Neck supple.  Cardiovascular: Regular rhythm and intact distal pulses. Tachycardia present.  No murmur heard. Pulmonary/Chest: Effort normal and breath sounds normal. No respiratory distress. She has no wheezes. She has no rales.  Abdominal: Soft. She exhibits no distension. There is no tenderness. There is no rebound and no guarding.  Underneath the pannus there is evidence of but no skin breakdown or signs of cellulitis  Musculoskeletal: Normal range of motion. She exhibits edema. She exhibits no tenderness.  2+ edema bilateral lower extremities to the midshin.  Deformities of the feet where second toes have been removed.  No signs of erythema, warmth or fluctuance present  Neurological: She is alert and oriented to person, place, and time.  Skin: Skin is warm and dry. Capillary refill takes 2 to 3 seconds. No rash noted. No erythema.  Psychiatric: She has a normal mood and affect. Her behavior is normal.  Nursing note and vitals reviewed.    ED Treatments / Results  Labs (all labs ordered are listed, but only abnormal results are displayed) Labs Reviewed  CULTURE, BLOOD (ROUTINE X 2)  CULTURE, BLOOD (ROUTINE X 2)  URINE CULTURE  COMPREHENSIVE METABOLIC PANEL  CBC WITH DIFFERENTIAL/PLATELET  URINALYSIS, ROUTINE W REFLEX MICROSCOPIC  I-STAT CG4 LACTIC ACID, ED    EKG EKG Interpretation  Date/Time:  Tuesday November 24 2017 13:26:56 EDT Ventricular Rate:  114 PR Interval:    QRS Duration: 80 QT Interval:  335 QTC  Calculation: 462 R Axis:   27 Text Interpretation:  Sinus tachycardia Consider anterior infarct Minimal ST depression, inferior leads No significant change since last tracing Confirmed by Blanchie Dessert [phone]) on 11/24/2017 1:37:52 PM Also confirmed by Blanchie Dessert [phone]), editor Shon Hale [phone])  on 11/24/2017 2:01:43 PM   Radiology Dg Chest Port 1 View  Result Date: 11/24/2017 CLINICAL DATA:  Cough and fever EXAM: PORTABLE CHEST 1 VIEW COMPARISON:  08/28/2017 chest radiograph. FINDINGS: In Stable cardiomediastinal silhouette with normal heart size. No pneumothorax. No pleural effusion. Lungs appear clear, with no acute consolidative airspace disease and no pulmonary edema. IMPRESSION: No active disease. Electronically Signed   By: Ilona Sorrel M.D.   On: 11/24/2017 13:42    Procedures Procedures (including critical care time)  Medications Ordered in ED Medications  acetaminophen (TYLENOL) tablet 650 mg (has no administration in time range)  sodium chloride 0.9 % bolus 2,000 mL (has no administration in time range)  ondansetron (ZOFRAN) injection 4 mg (has no administration in time range)     Initial Impression / Assessment and Plan / ED Course  I have reviewed the triage vital signs and the nursing notes.  Pertinent labs & imaging results that were available during my care of the patient were reviewed by me and considered in my medical  decision making (see chart for details).   Patient presenting today via EMS with concerns for code sepsis.  Patient is hot to the touch, tachycardic and initially hypotensive by EMS.  She is extremely weak when she attempts to stand or walk.  She has had a dry cough but denies any shortness of breath or abdominal pain.  Also one episode of emesis this morning.  Lower suspicion for cardiac etiology, no signs of cellulitis today.  Patient denies any urinary symptoms however concern for possible pneumonia versus UTI.  Sepsis orders set initiated.   Patient will be given 2 L of fluid.  Initial blood pressure here was 112/48.  She is mentating normally and has no headache or neck pain concerning for meningitis.  Pt's lactate is 3.9.  CBC with pending WBC count and plt count but HB stable.  CMP with new AKI with Cr of 1.2 from baseline at 0.8.  Pt covered broadly with vanc/zosyn as CXR without signs of PNA.  UA still pending.  Pt is hypotensive in the 80's with maps of 50.  Just starting 2L of IV NS.  Already received 1L by EMS.  Due to morbid obesity 30mg /kg would be 4.5L.  Will re-eval after 3.   Pt checked out to Dr. Jeanell Sparrow at 1600.  CRITICAL CARE Performed by: Brittanee Ghazarian Total critical care time: 30 minutes Critical care time was exclusive of separately billable procedures and treating other patients. Critical care was necessary to treat or prevent imminent or life-threatening deterioration. Critical care was time spent personally by me on the following activities: development of treatment plan with patient and/or surrogate as well as nursing, discussions with consultants, evaluation of patient's response to treatment, examination of patient, obtaining history from patient or surrogate, ordering and performing treatments and interventions, ordering and review of laboratory studies, ordering and review of radiographic studies, pulse oximetry and re-evaluation of patient's condition.    Final Clinical Impressions(s) / ED Diagnoses   Final diagnoses:  None    ED Discharge Orders    None       Blanchie Dessert, MD 11/25/17 [phone]

## 2017-11-25 ENCOUNTER — Inpatient Hospital Stay (HOSPITAL_COMMUNITY): Payer: 59 | Admitting: Certified Registered"

## 2017-11-25 ENCOUNTER — Inpatient Hospital Stay (HOSPITAL_COMMUNITY): Payer: 59

## 2017-11-25 ENCOUNTER — Other Ambulatory Visit: Payer: Self-pay

## 2017-11-25 ENCOUNTER — Encounter (HOSPITAL_COMMUNITY): Payer: Self-pay | Admitting: *Deleted

## 2017-11-25 ENCOUNTER — Encounter (HOSPITAL_COMMUNITY)
Admission: EM | Disposition: A | Discharge status: Home Health | Payer: Self-pay | Source: Home / Self Care | Attending: Internal Medicine

## 2017-11-25 DIAGNOSIS — A419 Sepsis, unspecified organism: Secondary | ICD-10-CM

## 2017-11-25 HISTORY — PX: CYSTOSCOPY W/ URETERAL STENT PLACEMENT: SHX1429

## 2017-11-25 LAB — CBC
HCT: 39.5 % (ref 36.0–46.0)
HEMATOCRIT: 39.9 % (ref 36.0–46.0)
HEMOGLOBIN: 12.4 g/dL (ref 12.0–15.0)
Hemoglobin: 12.5 g/dL (ref 12.0–15.0)
MCH: 28.4 pg (ref 26.0–34.0)
MCH: 28.5 pg (ref 26.0–34.0)
MCHC: 31.4 g/dL (ref 30.0–36.0)
MCHC: 31.3 g/dL (ref 30.0–36.0)
MCV: 90.6 fL (ref 78.0–100.0)
MCV: 91.1 fL (ref 78.0–100.0)
Platelets: 38 10*3/uL — ABNORMAL LOW (ref 150–400)
Platelets: UNDETERMINED 10*3/uL (ref 150–400)
RBC: 4.36 MIL/uL (ref 3.87–5.11)
RBC: 4.38 MIL/uL (ref 3.87–5.11)
RDW: 15.5 % (ref 11.5–15.5)
RDW: 15.6 % — AB (ref 11.5–15.5)
WBC: 26.6 10*3/uL — ABNORMAL HIGH (ref 4.0–10.5)
WBC: 19.6 10*3/uL — ABNORMAL HIGH (ref 4.0–10.5)

## 2017-11-25 LAB — MRSA PCR SCREENING: MRSA BY PCR: POSITIVE — AB

## 2017-11-25 LAB — BLOOD CULTURE ID PANEL (REFLEXED)
Acinetobacter baumannii: NOT DETECTED
CANDIDA ALBICANS: NOT DETECTED
CANDIDA TROPICALIS: NOT DETECTED
CARBAPENEM RESISTANCE: NOT DETECTED
Candida glabrata: NOT DETECTED
Candida krusei: NOT DETECTED
Candida parapsilosis: NOT DETECTED
ENTEROBACTER CLOACAE COMPLEX: NOT DETECTED
Enterobacteriaceae species: DETECTED — AB
Enterococcus species: NOT DETECTED
Escherichia coli: DETECTED — AB
HAEMOPHILUS INFLUENZAE: NOT DETECTED
Klebsiella oxytoca: NOT DETECTED
Klebsiella pneumoniae: NOT DETECTED
Listeria monocytogenes: NOT DETECTED
Neisseria meningitidis: NOT DETECTED
PROTEUS SPECIES: NOT DETECTED
Pseudomonas aeruginosa: NOT DETECTED
SERRATIA MARCESCENS: NOT DETECTED
STAPHYLOCOCCUS AUREUS BCID: NOT DETECTED
STAPHYLOCOCCUS SPECIES: NOT DETECTED
STREPTOCOCCUS SPECIES: NOT DETECTED
Streptococcus agalactiae: NOT DETECTED
Streptococcus pneumoniae: NOT DETECTED
Streptococcus pyogenes: NOT DETECTED

## 2017-11-25 LAB — BASIC METABOLIC PANEL
Anion gap: 9 (ref 5–15)
BUN: 18 mg/dL (ref 6–20)
CALCIUM: 6.5 mg/dL — AB (ref 8.9–10.3)
CO2: 24 mmol/L (ref 22–32)
Chloride: 108 mmol/L (ref 98–111)
Creatinine, Ser: 0.95 mg/dL (ref 0.44–1.00)
GFR calc Af Amer: >60 mL/min (ref >60)
GLUCOSE: 112 mg/dL — AB (ref 70–99)
Potassium: 3.5 mmol/L (ref 3.5–5.1)
Sodium: 141 mmol/L (ref 135–145)

## 2017-11-25 LAB — LACTIC ACID, PLASMA
Lactic Acid, Venous: 1.4 mmol/L (ref 0.5–1.9)
Lactic Acid, Venous: 1.1 mmol/L (ref 0.5–1.9)

## 2017-11-25 LAB — TSH: TSH: 0.631 u[IU]/mL (ref 0.350–4.500)

## 2017-11-25 SURGERY — CYSTOSCOPY, WITH RETROGRADE PYELOGRAM AND URETERAL STENT INSERTION
Anesthesia: General | Site: Ureter | Laterality: Right

## 2017-11-25 MED ORDER — SCOPOLAMINE 1 MG/3DAYS TD PT72
MEDICATED_PATCH | TRANSDERMAL | Status: DC | PRN
  Administered 2017-11-25: 1 via TRANSDERMAL

## 2017-11-25 MED ORDER — MIDAZOLAM HCL 2 MG/2ML IJ SOLN: 0.5000 mg | Freq: Once | INTRAMUSCULAR | Status: DC | PRN

## 2017-11-25 MED ORDER — LACTATED RINGERS IV SOLN
INTRAVENOUS | Status: DC
  Administered 2017-11-25: 21:00:00 via INTRAVENOUS

## 2017-11-25 MED ORDER — CALCIUM CITRATE 950 (200 CA) MG PO TABS
200.0000 mg | ORAL_TABLET | Freq: Two times a day (BID) | ORAL | Status: DC
  Administered 2017-11-25 – 2017-11-29 (×9): 200 mg via ORAL
  Filled 2017-11-25 (×10): qty 1

## 2017-11-25 MED ORDER — SODIUM CHLORIDE 0.9 % IV BOLUS: 500.0000 mL | Freq: Once | INTRAVENOUS | Status: AC

## 2017-11-25 MED ORDER — FENTANYL CITRATE (PF) 100 MCG/2ML IJ SOLN: 25.0000 ug | INTRAMUSCULAR | Status: DC | PRN

## 2017-11-25 MED ORDER — LIDOCAINE HCL URETHRAL/MUCOSAL 2 % EX GEL
CUTANEOUS | Status: AC
  Filled 2017-11-25: qty 20

## 2017-11-25 MED ORDER — SUCCINYLCHOLINE CHLORIDE 200 MG/10ML IV SOSY
PREFILLED_SYRINGE | INTRAVENOUS | Status: DC | PRN
  Administered 2017-11-25: 160 mg via INTRAVENOUS

## 2017-11-25 MED ORDER — IOPAMIDOL (ISOVUE-300) INJECTION 61%
INTRAVENOUS | Status: AC
  Filled 2017-11-25: qty 50

## 2017-11-25 MED ORDER — PROMETHAZINE HCL 25 MG/ML IJ SOLN: 6.2500 mg | INTRAMUSCULAR | Status: DC | PRN

## 2017-11-25 MED ORDER — HYDROCODONE-ACETAMINOPHEN 5-325 MG PO TABS
2.0000 | ORAL_TABLET | Freq: Four times a day (QID) | ORAL | Status: DC | PRN
  Administered 2017-11-25: 2 via ORAL
  Filled 2017-11-25 (×2): qty 2

## 2017-11-25 MED ORDER — CHLORHEXIDINE GLUCONATE CLOTH 2 % EX PADS
6.0000 | MEDICATED_PAD | Freq: Every day | CUTANEOUS | Status: DC
  Administered 2017-11-26 – 2017-11-28 (×3): 6 via TOPICAL

## 2017-11-25 MED ORDER — MIDAZOLAM HCL 5 MG/5ML IJ SOLN
INTRAMUSCULAR | Status: DC | PRN
  Administered 2017-11-25: 2 mg via INTRAVENOUS

## 2017-11-25 MED ORDER — IOPAMIDOL (ISOVUE-300) INJECTION 61%
INTRAVENOUS | Status: DC | PRN
  Administered 2017-11-25: 13 mL via URETHRAL

## 2017-11-25 MED ORDER — POTASSIUM CHLORIDE CRYS ER 20 MEQ PO TBCR
20.0000 meq | EXTENDED_RELEASE_TABLET | Freq: Once | ORAL | Status: AC
  Administered 2017-11-25: 20 meq via ORAL
  Filled 2017-11-25: qty 1

## 2017-11-25 MED ORDER — STERILE WATER FOR IRRIGATION IR SOLN
Status: DC | PRN
  Administered 2017-11-25: 1000 mL

## 2017-11-25 MED ORDER — LIDOCAINE 2% (20 MG/ML) 5 ML SYRINGE
INTRAMUSCULAR | Status: DC | PRN
  Administered 2017-11-25: 40 mg via INTRAVENOUS

## 2017-11-25 MED ORDER — FENTANYL CITRATE (PF) 250 MCG/5ML IJ SOLN
INTRAMUSCULAR | Status: AC
  Filled 2017-11-25: qty 5

## 2017-11-25 MED ORDER — FENTANYL CITRATE (PF) 250 MCG/5ML IJ SOLN
INTRAMUSCULAR | Status: DC | PRN
  Administered 2017-11-25: 50 ug via INTRAVENOUS
  Administered 2017-11-25: 25 ug via INTRAVENOUS

## 2017-11-25 MED ORDER — HYDROCODONE-ACETAMINOPHEN 5-325 MG PO TABS
1.0000 | ORAL_TABLET | Freq: Four times a day (QID) | ORAL | Status: DC | PRN
  Administered 2017-11-27: 1 via ORAL

## 2017-11-25 MED ORDER — ONDANSETRON HCL 4 MG/2ML IJ SOLN
INTRAMUSCULAR | Status: AC
  Filled 2017-11-25: qty 2

## 2017-11-25 MED ORDER — ONDANSETRON HCL 4 MG/2ML IJ SOLN
INTRAMUSCULAR | Status: DC | PRN
  Administered 2017-11-25: 4 mg via INTRAVENOUS

## 2017-11-25 MED ORDER — 0.9 % SODIUM CHLORIDE (POUR BTL) OPTIME
TOPICAL | Status: DC | PRN
  Administered 2017-11-25: 1000 mL

## 2017-11-25 MED ORDER — MEPERIDINE HCL 50 MG/ML IJ SOLN: 6.2500 mg | INTRAMUSCULAR | Status: DC | PRN

## 2017-11-25 MED ORDER — SODIUM CHLORIDE 0.9 % IV BOLUS
500.0000 mL | Freq: Once | INTRAVENOUS | Status: AC
  Administered 2017-11-25: 500 mL via INTRAVENOUS

## 2017-11-25 MED ORDER — DEXAMETHASONE SODIUM PHOSPHATE 10 MG/ML IJ SOLN
INTRAMUSCULAR | Status: AC
  Filled 2017-11-25: qty 1

## 2017-11-25 MED ORDER — HYDROCODONE-ACETAMINOPHEN 5-325 MG PO TABS
1.0000 | ORAL_TABLET | Freq: Once | ORAL | Status: AC
  Administered 2017-11-25: 1 via ORAL
  Filled 2017-11-25: qty 1

## 2017-11-25 MED ORDER — MIDAZOLAM HCL 2 MG/2ML IJ SOLN
INTRAMUSCULAR | Status: AC
  Filled 2017-11-25: qty 2

## 2017-11-25 MED ORDER — DEXAMETHASONE SODIUM PHOSPHATE 10 MG/ML IJ SOLN
INTRAMUSCULAR | Status: DC | PRN
  Administered 2017-11-25: 6 mg via INTRAVENOUS

## 2017-11-25 MED ORDER — SODIUM CHLORIDE 0.9 % IV SOLN
INTRAVENOUS | Status: DC
  Administered 2017-11-25: via INTRAVENOUS

## 2017-11-25 MED ORDER — SCOPOLAMINE 1 MG/3DAYS TD PT72
MEDICATED_PATCH | TRANSDERMAL | Status: AC
Start: 2017-11-25 — End: ?
  Filled 2017-11-25: qty 2

## 2017-11-25 MED ORDER — MUPIROCIN 2 % EX OINT
1.0000 "application " | TOPICAL_OINTMENT | Freq: Two times a day (BID) | CUTANEOUS | Status: DC
  Administered 2017-11-25 – 2017-11-29 (×9): 1 via NASAL
  Filled 2017-11-25: qty 22

## 2017-11-25 MED ORDER — PROPOFOL 10 MG/ML IV BOLUS
INTRAVENOUS | Status: AC
  Filled 2017-11-25: qty 20

## 2017-11-25 MED ORDER — SODIUM CHLORIDE 0.9 % IV SOLN
1.0000 g | INTRAVENOUS | Status: DC
  Administered 2017-11-25 – 2017-11-27 (×3): 1 g via INTRAVENOUS
  Filled 2017-11-25 (×4): qty 10

## 2017-11-25 MED ORDER — PROPOFOL 10 MG/ML IV BOLUS
INTRAVENOUS | Status: DC | PRN
  Administered 2017-11-25: 200 mg via INTRAVENOUS
  Administered 2017-11-25: 80 mg via INTRAVENOUS

## 2017-11-25 SURGICAL SUPPLY — 31 items
ADAPTER CATH URET PLST 4-6FR (CATHETERS) IMPLANT
ADPR CATH URET STRL DISP 4-6FR (CATHETERS)
APL SKNCLS STERI-STRIP NONHPOA (GAUZE/BANDAGES/DRESSINGS)
BAG URINE DRAINAGE (UROLOGICAL SUPPLIES) ×2 IMPLANT
BAG URO CATCHER STRL LF (MISCELLANEOUS) ×2 IMPLANT
BENZOIN TINCTURE PRP APPL 2/3 (GAUZE/BANDAGES/DRESSINGS) IMPLANT
BLADE 10 SAFETY STRL DISP (BLADE) ×2 IMPLANT
BUCKET BIOHAZARD WASTE 5 GAL (MISCELLANEOUS) ×2 IMPLANT
CATH FOLEY 2WAY SLVR  5CC 16FR (CATHETERS) ×1
CATH FOLEY 2WAY SLVR 5CC 16FR (CATHETERS) IMPLANT
CATH URET 5FR 28IN CONE TIP (BALLOONS)
CATH URET 5FR 70CM CONE TIP (BALLOONS) IMPLANT
DRAPE CAMERA CLOSED 9X96 (DRAPES) ×4 IMPLANT
GOWN STRL REUS W/ TWL LRG LVL3 (GOWN DISPOSABLE) ×1 IMPLANT
GOWN STRL REUS W/ TWL XL LVL3 (GOWN DISPOSABLE) ×1 IMPLANT
GOWN STRL REUS W/TWL LRG LVL3 (GOWN DISPOSABLE) ×2
GOWN STRL REUS W/TWL XL LVL3 (GOWN DISPOSABLE) ×2
GUIDEWIRE COOK  .035 (WIRE) IMPLANT
GUIDEWIRE STR DUAL SENSOR (WIRE) ×1 IMPLANT
KIT TURNOVER KIT B (KITS) ×2 IMPLANT
NS IRRIG 1000ML POUR BTL (IV SOLUTION) ×3 IMPLANT
PACK CYSTO (CUSTOM PROCEDURE TRAY) ×2 IMPLANT
PAD ARMBOARD 7.5X6 YLW CONV (MISCELLANEOUS) ×4 IMPLANT
PLUG CATH AND CAP STER (CATHETERS) IMPLANT
STENT URET 6FRX24 CONTOUR (STENTS) ×2 IMPLANT
SYRINGE CONTROL L 12CC (SYRINGE) ×2 IMPLANT
SYRINGE CONTROL LL 12CC (SYRINGE) ×1 IMPLANT
SYRINGE TOOMEY DISP (SYRINGE) IMPLANT
UNDERPAD 30X30 (UNDERPADS AND DIAPERS) ×2 IMPLANT
WATER STERILE IRR 1000ML POUR (IV SOLUTION) ×2 IMPLANT
WIRE COONS/BENSON .038X145CM (WIRE) ×1 IMPLANT

## 2017-11-25 NOTE — Op Note (Signed)
Preoperative diagnosis: Infected, obstructing right ureteral stone  Postoperative diagnosis: Same  Principal procedure: Cystoscopy, right retrograde ureteropyelogram, fluoroscopic interpretation, placement of 6 French by 24 cm contour double-J stent without tether  Surgeon: Judythe Postema  Anesthesia: General  Complications: None  Specimen: None  Drains: Above mentioned stent  Indications: 62 year old female with obstructing, infected right ureteral stone, diagnosed with CT scan.  The patient has urosepsis.  She has been treated appropriately with vigorous hydration, IV antibiotics, and now presents for decompression of her right renal unit with double-J stent.  I discussed the procedure as well as the risks and complications with the patient who is aware of these and desires to proceed.  Findings: Bladder was inspected, normal urothelium with normal ureteral orifice ease.  Retrograde ureteropyelogram revealed a normal distal ureter with minimal contrast getting by the filling defect in the right mid ureter which was consistent with the prior stone.  There was hydroureteronephrosis proximal to this with pyelocaliectasis.  Description of procedure: The patient was properly identified in the holding area, has been on IV antibiotics, and the appropriate side was marked.  She was then taken to the operating room where general anesthetic was administered.  She was placed in the dorsolithotomy position.  Genitalia and perineum were prepped and draped and timeout was performed.  A 22 French panendoscope was advanced into the bladder with systematic inspection performed.  The right ureteral orifice above-mentioned findings were noted.  Was then cannulated with a 6 [place] open-ended catheter and the previously mentioned right retrograde ureteropyelogram was performed using Omnipaque.  The sensor tip guidewire was then advanced through the open-ended catheter, passed the stone with a curl seen  fluoroscopically in the upper pole calyceal system.  The open-ended catheter was then removed.  6 [place] by 24 cm contour double-J stent was then deployed in the ureter with excellent proximal and distal curl seen using fluoroscopy and cystoscopy, respectively.  At this point, the procedure was terminated.  The bladder was drained with a Foley catheter with the balloon filled with 10 cc of water.  This was hooked to bedside bag.  The patient was then awakened and taken to the PACU in stable condition.  She tolerated the procedure well.

## 2017-11-25 NOTE — Transfer of Care (Signed)
Immediate Anesthesia Transfer of Care Note  Patient: [name]  Procedure(s) Performed: CYSTOSCOPY WITH RETROGRADE RIGHT URETERAL STENT PLACEMENT (Right Ureter)  Patient Location: PACU  Anesthesia Type:General  Level of Consciousness: awake, alert , oriented and patient cooperative  Airway & Oxygen Therapy: Patient Spontanous Breathing and Patient connected to nasal cannula oxygen  Post-op Assessment: Report given to RN, Post -op Vital signs reviewed and stable and Patient moving all extremities  Post vital signs: Reviewed and stable  Last Vitals:  Vitals Value Taken Time  BP    Temp    Pulse    Resp    SpO2      Last Pain:  Vitals:   11/25/17 2230  TempSrc:   PainSc: 0-No pain      Patients Stated Pain Goal: 0 (28/76/81 1572)  Complications: No apparent anesthesia complications

## 2017-11-25 NOTE — Anesthesia Procedure Notes (Signed)
Procedure Name: Intubation Date/Time: 11/25/2017 9:44 PM Performed by: Myna Bright, CRNA Pre-anesthesia Checklist: Patient identified, Emergency Drugs available, Suction available and Patient being monitored Patient Re-evaluated:Patient Re-evaluated prior to induction Oxygen Delivery Method: Circle system utilized Preoxygenation: Pre-oxygenation with 100% oxygen Induction Type: IV induction, Rapid sequence and Cricoid Pressure applied Laryngoscope Size: Mac and 3 Grade View: Grade I Tube type: Oral Tube size: 7.0 mm Number of attempts: 1 Airway Equipment and Method: Stylet Placement Confirmation: ETT inserted through vocal cords under direct vision,  positive ETCO2 and breath sounds checked- equal and bilateral Secured at: 22 cm Tube secured with: Tape Dental Injury: Teeth and Oropharynx as per pre-operative assessment

## 2017-11-25 NOTE — Anesthesia Preprocedure Evaluation (Addendum)
Anesthesia Evaluation  Patient identified by MRN, date of birth, ID band Patient awake    Reviewed: Allergy & Precautions, NPO status , Patient's Chart, lab work & pertinent test results  History of Anesthesia Complications (+) PONV  Airway Mallampati: II  TM Distance: >3 FB Neck ROM: Full    Dental  (+) Caps, Dental Advisory Given   Pulmonary COPD,  COPD inhaler, former smoker (quit 1997),    breath sounds clear to auscultation       Cardiovascular hypertension, Pt. on medications + dysrhythmias  Rhythm:Regular Rate:Normal  '18 ECHO: EF 60-65%, valves OK   Neuro/Psych  Headaches,    GI/Hepatic Neg liver ROS, GERD  Medicated and Controlled,Nausea and vomiting with urosepsis S/p gastric bypass   Endo/Other  Hypothyroidism Morbid obesity  Renal/GU Urosepsis stones     Musculoskeletal  (+) Arthritis , Fibromyalgia -  Abdominal (+) + obese,   Peds  Hematology  (+) Blood dyscrasia (eliquis, thrombocytopenia plt 38k), ,   Anesthesia Other Findings   Reproductive/Obstetrics                            Anesthesia Physical Anesthesia Plan  ASA: III and emergent  Anesthesia Plan: General   Post-op Pain Management:    Induction: Intravenous and Rapid sequence  PONV Risk Score and Plan: 4 or greater and Ondansetron, Dexamethasone and Scopolamine patch - Pre-op  Airway Management Planned: Oral ETT  Additional Equipment:   Intra-op Plan:   Post-operative Plan: Extubation in OR  Informed Consent: I have reviewed the patients History and Physical, chart, labs and discussed the procedure including the risks, benefits and alternatives for the proposed anesthesia with the patient or authorized representative who has indicated his/her understanding and acceptance.   Dental advisory given  Plan Discussed with: CRNA and Surgeon  Anesthesia Plan Comments: (Plan routine monitors, GETA)        Anesthesia Quick Evaluation

## 2017-11-25 NOTE — Consult Note (Signed)
Urology Consult   Physician requesting consult: Regal[place]  Reason for consult:  infected right ureteral stone  History of Present Illness: [name] is a 62 y.o. female with multiple medical issues, morbid obesity and history of recurrent urolithiasis.  In the past she has been managed by Dr. Rana Snare.  She was admitted to the hospital yesterday with altered mental status and fever.  Because of her history and abdominal pain, CT stone protocol was performed revealing an obstructed right mid ureter with 9 mm stone.  She has bilateral renal stones, left greater than right.  She has infected urine and positive blood cultures.  Urologic consultation is requested for urgent management of her stone/urinary tract infection/hydronephrosis.  Baseline creatinine is 0.73.  There was a bump to 1.2 yesterday.  It is now 0.95.  Platelet count is low at 38,000/platelets are clumped, most likely due to her sepsis.  She has been on Eliquis but this has been stopped.  Currently, she is not having severe pain.  Past Medical History:  Diagnosis Date  . Abnormal Pap smear    years ago/no biopsy  . Allergic rhinitis   . Anemia, iron deficiency   . Arthritis    back- lower  . Asthma   . Atrial fibrillation ([place]) August 2012   OFF XARELTO LAST MONTH DUE TO BLEEDING IN STOOL  . Bursitis of hip left  . Dysrhythmia    afib, followed by Dr. Stanford Breed   . Edema of both legs   . Fatty liver 01/07/2012  . Fibroid 1974   fibroid cyst on left fallopian tube  . Fibromyalgia   . Foot ulcer ([place])    AREA HEALED RIGHT FOOT  . GERD (gastroesophageal reflux disease)   . Headache(784.0)    occasional sinus headache   . History of blood transfusion JULY 2015  . Hypertension   . Hypoparathyroidism ([place]) 01/07/2011  . Hypothyroidism   . Kidney stone 2015-09-22   passed on their own  . Morbid obesity ([place])   . Nephrolithiasis 2/16, 9/16   SEES DR Risa Grill  . Pernicious anemia 02/20/2014   followed by Debbrah Alar  . PONV (postoperative nausea and vomiting)   . Seizures ([place])    infancy secondary to fever  . Thyroid cancer ([place]) 2011   THYROIDECTOMY DONE  . Uterine cancer ([place]) 2014   Mirena IUD  . Vitamin D deficiency     Past Surgical History:  Procedure Laterality Date  . AMPUTATION Right 05/18/2014   Procedure: RIGHT FIFTH RAY AMPUTATION FOOT;  Surgeon: Wylene Simmer, MD;  Location: [facility];  Service: Orthopedics;  Laterality: Right;  . APPENDECTOMY    . BUNIONECTOMY     bilateral  . COLONOSCOPY WITH PROPOFOL N/A 02/02/2014   Procedure: COLONOSCOPY WITH PROPOFOL;  Surgeon: Irene Shipper, MD;  [facility]: [facility];  Service: Endoscopy;  Laterality: N/A;  . cyst on ovary removed     . CYSTOSCOPY W/ RETROGRADES  03/03/2012   Procedure: CYSTOSCOPY WITH RETROGRADE PYELOGRAM;  Surgeon: Bernestine Amass, MD;  Location: [facility];  Service: Urology;  Laterality: Bilateral;  . CYSTOSCOPY WITH RETROGRADE PYELOGRAM, URETEROSCOPY AND STENT PLACEMENT Left 08/25/2012   Procedure: CYSTOSCOPY WITH RETROGRADE PYELOGRAM, URETEROSCOPY;  Surgeon: Bernestine Amass, MD;  [facility];  Service: Urology;  Laterality: Left;  . DILATION AND CURETTAGE OF UTERUS N/A 02/21/2013   Procedure: DILATATION AND CURETTAGE;  Surgeon: Lyman Speller, MD;  Location: [facility];  Service: Gynecology;  Laterality: N/A;  .  DILATION AND CURETTAGE OF UTERUS N/A 04/09/2015   Procedure: [place] IUD removal;  Surgeon: Megan Salon, MD;  Location: [facility];  Service: Gynecology;  Laterality: N/A;  Patient weight 307lbs  . ESOPHAGOGASTRODUODENOSCOPY (EGD) WITH PROPOFOL N/A 02/02/2014   Procedure: ESOPHAGOGASTRODUODENOSCOPY (EGD) WITH PROPOFOL;  Surgeon: Irene Shipper, MD;  [facility];  Service: Endoscopy;  Laterality: N/A;  . GASTRIC BYPASS  1974  . HOLMIUM LASER APPLICATION Left 05/29/4816   Procedure: HOLMIUM LASER APPLICATION;  Surgeon: Bernestine Amass, MD;  [facility];  Service: Urology;  Laterality:  Left;  . LITHOTRIPSY  03/2012  . LITHOTRIPSY  2/16  . THYROIDECTOMY  05/15/2010  . TONSILLECTOMY AND ADENOIDECTOMY    . URETEROSCOPY  03/03/2012   Procedure: URETEROSCOPY;  Surgeon: Bernestine Amass, MD;  [facility];  Service: Urology;  Laterality: Left;     Current Hospital Medications: Scheduled Meds: . calcitRIOL  2 mcg Oral Daily  . calcium citrate  200 mg of elemental calcium Oral BID  . Chlorhexidine Gluconate Cloth  6 each Topical Q0600  . diltiazem  240 mg Oral Daily  . levothyroxine  300 mcg Oral QAC breakfast  . loratadine  10 mg Oral QPM  . mometasone-formoterol  2 puff Inhalation BID  . mupirocin ointment  1 application Nasal BID  . pantoprazole  40 mg Oral Daily   Continuous Infusions: . sodium chloride 100 mL/hr at 11/25/17 1723  . sodium chloride 10 mL/hr at 11/25/17 0020  . cefTRIAXone (ROCEPHIN)  IV 1 g (11/25/17 1139)  . fluconazole (DIFLUCAN) IV 400 mg (11/25/17 0212)   PRN Meds:.acetaminophen, albuterol, diphenhydrAMINE, ondansetron **OR** ondansetron (ZOFRAN) IV  Allergies:  Allergies  Allergen Reactions  . Other Anaphylaxis    NUTS  . Penicillins Other (See Comments)    Headache. Has patient had a PCN reaction causing immediate rash, facial/tongue/throat swelling, SOB or lightheadedness with hypotension: No Has patient had a PCN reaction causing severe rash involving mucus membranes or skin necrosis: No Has patient had a PCN reaction that required hospitalization: No Has patient had a PCN reaction occurring within the last 10 years: Yes If all of the above answers are "NO", then may proceed with Cephalosporin use.   [name] Amoxicillin-Pot Clavulanate Other (See Comments)    headache  . Food Hives    Potato  . Tomato Hives    Family History  Problem Relation Age of Onset  . Hypertension Father   . Diabetes Father   . Lung cancer Father   . Heart attack Father        MI at age 50  . Hypertension Mother   . Hyperthyroidism Mother   . Heart  disease Mother   . Heart attack Mother        MI at age 39  . Asthma Brother   . Hypertension Brother        younger  . Heart disease Brother        older  . Colon cancer Neg Hx   . Esophageal cancer Neg Hx   . Stomach cancer Neg Hx   . Kidney disease Neg Hx   . Liver disease Neg Hx     Social History:  reports that she quit smoking about 22 years ago. Her smoking use included cigarettes. She started smoking about 46 years ago. She has a 12.50 pack-year smoking history. She has never used smokeless tobacco. She reports that she drinks alcohol. She reports that she does not  use drugs.  ROS: A complete review of systems was performed.  All systems are negative except for pertinent findings as noted.  Physical Exam:  Vital signs in last 24 hours: Temp:  [98.2 F (36.8 C)-99.5 F (37.5 C)] 99.5 F (37.5 C) (07/03 1450) Pulse Rate:  [81-94] 87 (07/03 1450) Resp:  [11-21] 19 (07/03 1450) BP: (84-133)/(45-70) 133/70 (07/03 1450) SpO2:  [93 %-96 %] 95 % (07/03 1450) Weight:  [152.9 kg (337 lb 1.3 oz)] 152.9 kg (337 lb 1.3 oz) (07/03 7253) General:  Alert and oriented, No acute distress.  She is morbidly obese HEENT: Normocephalic, atraumatic Neck: No gross abnormality Cardiovascular: Regular rate  Lungs: Normal effort Abdomen: Obese Extremities: No edema Neurologic: Grossly intact  Laboratory Data:  Recent Labs    11/24/17 1431 11/25/17 0106 11/25/17 0959  WBC 21.9* 26.6* 19.6*  HGB 13.9 12.4 12.5  HCT 44.0 39.5 39.9  PLT PLATELET CLUMPS NOTED ON SMEAR, COUNT APPEARS DECREASED 38* PLATELET CLUMPS NOTED ON SMEAR, UNABLE TO ESTIMATE    Recent Labs    11/24/17 1431 11/25/17 0106  NA 140 141  K 3.3* 3.5  CL 100 108  GLUCOSE 104* 112*  BUN 23* 18  CALCIUM 7.5* 6.5*  CREATININE 1.20* 0.95     Results for orders placed or performed during the hospital encounter of 11/24/17 (from the past 24 hour(s))  Urinalysis, Routine w reflex microscopic     Status: Abnormal    Collection Time: 11/24/17  7:44 PM  Result Value Ref Range   Color, Urine YELLOW YELLOW   APPearance CLOUDY (A) CLEAR   Specific Gravity, Urine 1.012 1.005 - 1.030   pH 6.0 5.0 - 8.0   Glucose, UA NEGATIVE NEGATIVE mg/dL   Hgb urine dipstick LARGE (A) NEGATIVE   Bilirubin Urine NEGATIVE NEGATIVE   Ketones, ur NEGATIVE NEGATIVE mg/dL   Protein, ur NEGATIVE NEGATIVE mg/dL   Nitrite POSITIVE (A) NEGATIVE   Leukocytes, UA LARGE (A) NEGATIVE   RBC / HPF >50 (H) 0 - 5 RBC/hpf   WBC, UA >50 (H) 0 - 5 WBC/hpf   Bacteria, UA FEW (A) NONE SEEN   Squamous Epithelial / LPF 0-5 0 - 5   WBC Clumps PRESENT    Amorphous Crystal PRESENT   Lactic acid, plasma     Status: Abnormal   Collection Time: 11/24/17  8:17 PM  Result Value Ref Range   Lactic Acid, Venous 3.1 (HH) 0.5 - 1.9 mmol/L  I-Stat CG4 Lactic Acid, ED  (not at  [facility])     Status: Abnormal   Collection Time: 11/24/17  8:24 PM  Result Value Ref Range   Lactic Acid, Venous 3.02 (HH) 0.5 - 1.9 mmol/L   Comment NOTIFIED PHYSICIAN   MRSA PCR Screening     Status: Abnormal   Collection Time: 11/24/17 11:57 PM  Result Value Ref Range   MRSA by PCR POSITIVE (A) NEGATIVE  Basic metabolic panel     Status: Abnormal   Collection Time: 11/25/17  1:06 AM  Result Value Ref Range   Sodium 141 135 - 145 mmol/L   Potassium 3.5 3.5 - 5.1 mmol/L   Chloride 108 98 - 111 mmol/L   CO2 24 22 - 32 mmol/L   Glucose, Bld 112 (H) 70 - 99 mg/dL   BUN 18 6 - 20 mg/dL   Creatinine, Ser 0.95 0.44 - 1.00 mg/dL   Calcium 6.5 (L) 8.9 - 10.3 mg/dL   GFR calc non Af Amer >60 >60 mL/min  GFR calc Af Amer >60 >60 mL/min   Anion gap 9 5 - 15  CBC     Status: Abnormal   Collection Time: 11/25/17  1:06 AM  Result Value Ref Range   WBC 26.6 (H) 4.0 - 10.5 K/uL   RBC 4.36 3.87 - 5.11 MIL/uL   Hemoglobin 12.4 12.0 - 15.0 g/dL   HCT 39.5 36.0 - 46.0 %   MCV 90.6 78.0 - 100.0 fL   MCH 28.4 26.0 - 34.0 pg   MCHC 31.4 30.0 - 36.0 g/dL   RDW 15.5 11.5 - 15.5 %    Platelets 38 (L) 150 - 400 K/uL  TSH     Status: None   Collection Time: 11/25/17  1:06 AM  Result Value Ref Range   TSH 0.631 0.350 - 4.500 uIU/mL  Lactic acid, plasma     Status: None   Collection Time: 11/25/17  9:59 AM  Result Value Ref Range   Lactic Acid, Venous 1.4 0.5 - 1.9 mmol/L  CBC     Status: Abnormal   Collection Time: 11/25/17  9:59 AM  Result Value Ref Range   WBC 19.6 (H) 4.0 - 10.5 K/uL   RBC 4.38 3.87 - 5.11 MIL/uL   Hemoglobin 12.5 12.0 - 15.0 g/dL   HCT 39.9 36.0 - 46.0 %   MCV 91.1 78.0 - 100.0 fL   MCH 28.5 26.0 - 34.0 pg   MCHC 31.3 30.0 - 36.0 g/dL   RDW 15.6 (H) 11.5 - 15.5 %   Platelets PLATELET CLUMPS NOTED ON SMEAR, UNABLE TO ESTIMATE 150 - 400 K/uL  Lactic acid, plasma     Status: None   Collection Time: 11/25/17 11:47 AM  Result Value Ref Range   Lactic Acid, Venous 1.1 0.5 - 1.9 mmol/L   Recent Results (from the past 240 hour(s))  Blood Culture (routine x 2)     Status: None (Preliminary result)   Collection Time: 11/24/17  1:25 PM  Result Value Ref Range Status   Specimen Description BLOOD LEFT FOREARM  Final   Special Requests   Final    BOTTLES DRAWN AEROBIC AND ANAEROBIC Blood Culture adequate volume   Culture   Final    NO GROWTH 1 DAY Performed at [facility], [address], [place], [place] [address]    Report Status PENDING  Incomplete  Blood Culture (routine x 2)     Status: None (Preliminary result)   Collection Time: 11/24/17  2:58 PM  Result Value Ref Range Status   Specimen Description BLOOD LEFT HAND  Final   Special Requests   Final    BOTTLES DRAWN AEROBIC AND ANAEROBIC Blood Culture adequate volume   Culture  Setup Time   Final    ANAEROBIC BOTTLE ONLY GRAM NEGATIVE RODS CRITICAL RESULT CALLED TO, READ BACK BY AND VERIFIED WITH: Asencion Islam PharmD 10:30 11/25/17 ([email or web address]) Performed at [facility], [address], [place], [place] [address]    Culture GRAM NEGATIVE RODS  Final   Report Status PENDING   Incomplete  Blood Culture ID Panel (Reflexed)     Status: Abnormal   Collection Time: 11/24/17  2:58 PM  Result Value Ref Range Status   Enterococcus species NOT DETECTED NOT DETECTED Final   Listeria monocytogenes NOT DETECTED NOT DETECTED Final   Staphylococcus species NOT DETECTED NOT DETECTED Final   Staphylococcus aureus NOT DETECTED NOT DETECTED Final   Streptococcus species NOT DETECTED NOT DETECTED Final   Streptococcus  agalactiae NOT DETECTED NOT DETECTED Final   Streptococcus pneumoniae NOT DETECTED NOT DETECTED Final   Streptococcus pyogenes NOT DETECTED NOT DETECTED Final   Acinetobacter baumannii NOT DETECTED NOT DETECTED Final   Enterobacteriaceae species DETECTED (A) NOT DETECTED Final    Comment: Enterobacteriaceae represent a large family of gram-negative bacteria, not a single organism. CRITICAL RESULT CALLED TO, READ BACK BY AND VERIFIED WITH: Jacinto Reap Mancheril PharmD 10:30 11/25/17 ([email or web address])    Enterobacter cloacae complex NOT DETECTED NOT DETECTED Final   Escherichia coli DETECTED (A) NOT DETECTED Final    Comment: CRITICAL RESULT CALLED TO, READ BACK BY AND VERIFIED WITH: Asencion Islam PharmD 10:30 11/25/17 ([email or web address])    Klebsiella oxytoca NOT DETECTED NOT DETECTED Final   Klebsiella pneumoniae NOT DETECTED NOT DETECTED Final   Proteus species NOT DETECTED NOT DETECTED Final   Serratia marcescens NOT DETECTED NOT DETECTED Final   Carbapenem resistance NOT DETECTED NOT DETECTED Final   Haemophilus influenzae NOT DETECTED NOT DETECTED Final   Neisseria meningitidis NOT DETECTED NOT DETECTED Final   Pseudomonas aeruginosa NOT DETECTED NOT DETECTED Final   Candida albicans NOT DETECTED NOT DETECTED Final   Candida glabrata NOT DETECTED NOT DETECTED Final   Candida krusei NOT DETECTED NOT DETECTED Final   Candida parapsilosis NOT DETECTED NOT DETECTED Final   Candida tropicalis NOT DETECTED NOT DETECTED Final    Comment: Performed at [facility], [place] [address],  [place], [place] [address]  MRSA PCR Screening     Status: Abnormal   Collection Time: 11/24/17 11:57 PM  Result Value Ref Range Status   MRSA by PCR POSITIVE (A) NEGATIVE Final    Comment:        The GeneXpert MRSA Assay (FDA approved for NASAL specimens only), is one component of a comprehensive MRSA colonization surveillance program. It is not intended to diagnose MRSA infection nor to guide or monitor treatment for MRSA infections. RESULT CALLED TO, READ BACK BY AND VERIFIED WITH: Dorise Bullion RN 11/25/17 0324 JDW     Renal Function: Recent Labs    11/24/17 1431 11/25/17 0106  CREATININE 1.20* 0.95   Estimated Creatinine Clearance: 93.5 mL/min (by C-G formula based on SCr of 0.95 mg/dL).  Radiologic Imaging: Dg Chest Port 1 View  Result Date: 11/24/2017 CLINICAL DATA:  Cough and fever EXAM: PORTABLE CHEST 1 VIEW COMPARISON:  08/28/2017 chest radiograph. FINDINGS: In Stable cardiomediastinal silhouette with normal heart size. No pneumothorax. No pleural effusion. Lungs appear clear, with no acute consolidative airspace disease and no pulmonary edema. IMPRESSION: No active disease. Electronically Signed   By: Ilona Sorrel M.D.   On: 11/24/2017 13:42   Ct Renal Stone Study  Result Date: 11/25/2017 CLINICAL DATA:  Altered mental status and fever. EXAM: CT ABDOMEN AND PELVIS WITHOUT CONTRAST TECHNIQUE: Multidetector CT imaging of the abdomen and pelvis was performed following the standard protocol without IV contrast. COMPARISON:  12/10/2016 FINDINGS: Lower chest: Dependent atelectasis noted in the lower lobes bilaterally. Hepatobiliary: The liver shows diffusely decreased attenuation suggesting steatosis. Liver measures 23.9 cm craniocaudal length, enlarged. Gallbladder is distended. No intrahepatic or extrahepatic biliary dilation. Pancreas: No focal mass lesion. No dilatation of the main duct. No intraparenchymal cyst. No peripancreatic edema. Spleen: No splenomegaly. No focal mass lesion.  Adrenals/Urinary Tract: No adrenal nodule or mass. Multiple stones are seen in the kidneys bilaterally. Dominant stone in the left kidney fills a lower pole collecting system and measures 1.7 x 0.9 x 2.4 cm. Left ureter is unremarkable. Multiple  stones are noted in the right kidney including a posterior interpolar stone measuring 11 x 7 x 16 mm. There is mild fullness of the right intrarenal collecting system with right perinephric edema/inflammation. Several tiny stones are seen in the right renal pelvis. Edema/inflammation is seen around the mildly distended right ureter. 10 x 10 x 8 mm distal right ureteral stone is identified on axial imaging (series 3: Image 73). Foley catheter decompresses the urinary bladder. Stomach/Bowel: Stomach is nondistended. No gastric wall thickening. No evidence of outlet obstruction. Duodenum is normally positioned as is the ligament of Treitz. No small bowel wall thickening. No small bowel dilatation. The terminal ileum is normal. The appendix is not visualized, but there is no edema or inflammation in the region of the cecum. No gross colonic mass. No colonic wall thickening. No substantial diverticular change. Vascular/Lymphatic: There is abdominal aortic atherosclerosis without aneurysm. There is no gastrohepatic or hepatoduodenal ligament lymphadenopathy. No intraperitoneal or retroperitoneal lymphadenopathy. No pelvic sidewall lymphadenopathy. Reproductive: IUD is visualized in the uterus. There is no adnexal mass. Other: No intraperitoneal free fluid. Musculoskeletal: No worrisome lytic or sclerotic osseous abnormality. IMPRESSION: 1. 10 x 10 x 8 mm stone in the distal right ureter with periureteric edema/inflammation tracking proximally up to the right kidney. There is associated mild fullness of the right intrarenal collecting system and ureter proximal to the stone. Given the history of fever and mental status changes, infection proximal to the obstruction is a distinct  consideration. 2. Bilateral nephrolithiasis. 3. Hepatomegaly with hepatic steatosis. 4.  Aortic Atherosclerois (ICD10-170.0) Electronically Signed   By: Misty Stanley M.D.   On: 11/25/2017 10:43    I independently reviewed the above imaging studies.  Impression/Assessment:  Bilateral renal calculi with obstructing, infected right mid ureteral stone with sepsis  Plan:  I have spoken with the patient about urgent management of her infected/obstructing right ureteral stone.  I would recommend cystoscopy, retrograde ureteropyelogram with double-J stent placement urgently.  This would be followed by adequate inpatient and outpatient antibiotic management, follow-up in the office with eventual scheduling of right ureteroscopy, laser and extraction of her ureteral stone and possible management of her right renal calculi.  She understands the process/procedure, risks and complications.  She desires to proceed.

## 2017-11-25 NOTE — Progress Notes (Signed)
PROGRESS NOTE    [name]  PPI:[id number] DOB: July 02, 1955 DOA: 11/24/2017 PCP: Debbrah Alar, NP    Brief Narrative: [name]  is a 62 y.o. female, [name] , HTN and morbid obesity presenting acute onset of altered mental status with fever at home.  She has been gradually getting weak however today the patient family noticed extreme confusion with nausea and vomiting and fever.  Patient received IV fluids in the emergency room and improved and her mental status gradually improved as well.  Code sepsis was called and patient received IV antibiotics as well.  She is alert awake and can converse at this time.  She denies any preceding chest pains shortness of breath or urinary symptoms.  In the emergency room she had to be catheterized and the urine analysis was positive for UTI.     Assessment & Plan:   Active Problems:   Hypothyroidism   Atrial fibrillation ([place])   Sepsis ([place])   1-Sepsis; secondary to UTI> right ureter stone.  E coli Bacteremia;  Continue with IV fluids, IV bolus.  Change antibiotics to ceftriaxone. dioscorine vancomycin cefepime.  CT ; 10 x 10 x 8 mm stone in the distal right ureter with periureteric edema/inflammation tracking proximally up to the right kidney. There is associated mild fullness of the right intrarenal collecting system and ureter proximal to the stone. Given the history of fever and mental status changes, infection proximal to the obstruction is a distinct consideration. Bilateral nephrolithiasis. 3. Hepatomegaly with hepatic steatosis. -urology consulted. Patient made NPO. Plan for stent tonight.   2-UTI, urine retention.  Foley catheter in place. For now await urology evaluation.   3-Acute encephalopathy, toxic. Related to infection.   4-Thrombocytopenia; suspect related to infection. Repeat labs in am.   5-A fib; hold eliquis due to severe thrombocytopenia.  On Cardizem, holder parameter for BP.    6-Hypocalcemia. Start supplementation    DVT prophylaxis: hold eliquis due to thrombocytopenia.  Code Status: full code.  Family Communication: care discussed with patient.  Disposition Plan: home when stable  Consultants:   Urology    Procedures:  CT renal;    Antimicrobials:  Received vancomyc and cefepime.   Ceftriaxone 7-03   Subjective: She is alert, complaints of bilateral flank pain, back pain.  Denies dysuria.  She had problems yesterday with urine retention.    Objective: Vitals:   11/25/17 0849 11/25/17 0957 11/25/17 1235 11/25/17 1258  BP: (!) 109/56 119/62 (!) 108/53   Pulse: 86 83 84   Resp: 16 11 (!) 21   Temp: 98.9 F (37.2 C)  98.2 F (36.8 C)   TempSrc: Oral  Oral   SpO2: 95% 95% 94% 96%  Weight:      Height:        Intake/Output Summary (Last 24 hours) at 11/25/2017 1334 Last data filed at 11/25/2017 1236 Gross per 24 hour  Intake 4708.61 ml  Output 2400 ml  Net 2308.61 ml   Filed Weights   11/24/17 1356 11/25/17 0000 11/25/17 0614  Weight: (!) 147.9 kg (326 lb) (!) 152.9 kg (337 lb 1.3 oz) (!) 152.9 kg (337 lb 1.3 oz)    Examination:  General exam: Appears calm and comfortable  Respiratory system: Clear to auscultation. Respiratory effort normal. Cardiovascular system: S1 & S2 heard, RRR. No JVD, murmurs, rubs, gallops or clicks. No pedal edema. Gastrointestinal system: Abdomen is nondistended, soft and nontender. No organomegaly or masses felt. Normal bowel sounds heard.  Central nervous system: Alert and oriented. No focal neurological deficits. Extremities: Symmetric 5 x 5 power. Skin: No rashes, lesions or ulcers Psychiatry: Judgement and insight appear normal. Mood & affect appropriate.     Data Reviewed: I have personally reviewed following labs and imaging studies  CBC: Recent Labs  Lab 11/24/17 1431 11/25/17 0106 11/25/17 0959  WBC 21.9* 26.6* 19.6*  NEUTROABS 21.1*  --   --   HGB 13.9 12.4 12.5  HCT 44.0  39.5 39.9  MCV 90.5 90.6 91.1  PLT PLATELET CLUMPS NOTED ON SMEAR, COUNT APPEARS DECREASED 38* PLATELET CLUMPS NOTED ON SMEAR, UNABLE TO ESTIMATE   Basic Metabolic Panel: Recent Labs  Lab 11/24/17 1431 11/25/17 0106  NA 140 141  K 3.3* 3.5  CL 100 108  CO2 23 24  GLUCOSE 104* 112*  BUN 23* 18  CREATININE 1.20* 0.95  CALCIUM 7.5* 6.5*   GFR: Estimated Creatinine Clearance: 93.5 mL/min (by C-G formula based on SCr of 0.95 mg/dL). Liver Function Tests: Recent Labs  Lab 11/24/17 1431  AST 27  ALT 37  ALKPHOS 102  BILITOT 1.1  PROT 6.0*  ALBUMIN 3.1*   No results for input(s): LIPASE, AMYLASE in the last 168 hours. No results for input(s): AMMONIA in the last 168 hours. Coagulation Profile: No results for input(s): INR, PROTIME in the last 168 hours. Cardiac Enzymes: No results for input(s): CKTOTAL, CKMB, CKMBINDEX, TROPONINI in the last 168 hours. BNP (last 3 results) Recent Labs    03/20/17 1420 08/28/17 0930  PROBNP 25.0 17.0   HbA1C: No results for input(s): HGBA1C in the last 72 hours. CBG: No results for input(s): GLUCAP in the last 168 hours. Lipid Profile: No results for input(s): CHOL, HDL, LDLCALC, TRIG, CHOLHDL, LDLDIRECT in the last 72 hours. Thyroid Function Tests: Recent Labs    11/25/17 0106  TSH 0.631   Anemia Panel: No results for input(s): VITAMINB12, FOLATE, FERRITIN, TIBC, IRON, RETICCTPCT in the last 72 hours. Sepsis Labs: Recent Labs  Lab 11/24/17 2017 11/24/17 2024 11/25/17 0959 11/25/17 1147  LATICACIDVEN 3.1* 3.02* 1.4 1.1    Recent Results (from the past 240 hour(s))  Blood Culture (routine x 2)     Status: None (Preliminary result)   Collection Time: 11/24/17  1:25 PM  Result Value Ref Range Status   Specimen Description BLOOD LEFT FOREARM  Final   Special Requests   Final    BOTTLES DRAWN AEROBIC AND ANAEROBIC Blood Culture adequate volume   Culture   Final    NO GROWTH < 24 HOURS Performed at [facility], [place] [address], [place], [place] [address]    Report Status PENDING  Incomplete  Blood Culture (routine x 2)     Status: None (Preliminary result)   Collection Time: 11/24/17  2:58 PM  Result Value Ref Range Status   Specimen Description BLOOD LEFT HAND  Final   Special Requests   Final    BOTTLES DRAWN AEROBIC AND ANAEROBIC Blood Culture adequate volume   Culture  Setup Time   Final    ANAEROBIC BOTTLE ONLY GRAM NEGATIVE RODS CRITICAL RESULT CALLED TO, READ BACK BY AND VERIFIED WITH: Asencion Islam PharmD 10:30 11/25/17 ([email or web address]) Performed at [facility], [address], [place], [place] [address]    Culture GRAM NEGATIVE RODS  Final   Report Status PENDING  Incomplete  Blood Culture ID Panel (Reflexed)     Status: Abnormal   Collection Time: 11/24/17  2:58 PM  Result Value Ref Range Status   Enterococcus species NOT DETECTED NOT DETECTED Final   Listeria monocytogenes NOT DETECTED NOT DETECTED Final   Staphylococcus species NOT DETECTED NOT DETECTED Final   Staphylococcus aureus NOT DETECTED NOT DETECTED Final   Streptococcus species NOT DETECTED NOT DETECTED Final   Streptococcus agalactiae NOT DETECTED NOT DETECTED Final   Streptococcus pneumoniae NOT DETECTED NOT DETECTED Final   Streptococcus pyogenes NOT DETECTED NOT DETECTED Final   Acinetobacter baumannii NOT DETECTED NOT DETECTED Final   Enterobacteriaceae species DETECTED (A) NOT DETECTED Final    Comment: Enterobacteriaceae represent a large family of gram-negative bacteria, not a single organism. CRITICAL RESULT CALLED TO, READ BACK BY AND VERIFIED WITH: Jacinto Reap Mancheril PharmD 10:30 11/25/17 ([email or web address])    Enterobacter cloacae complex NOT DETECTED NOT DETECTED Final   Escherichia coli DETECTED (A) NOT DETECTED Final    Comment: CRITICAL RESULT CALLED TO, READ BACK BY AND VERIFIED WITH: Asencion Islam PharmD 10:30 11/25/17 ([email or web address])    Klebsiella oxytoca NOT DETECTED NOT DETECTED Final   Klebsiella pneumoniae NOT DETECTED  NOT DETECTED Final   Proteus species NOT DETECTED NOT DETECTED Final   Serratia marcescens NOT DETECTED NOT DETECTED Final   Carbapenem resistance NOT DETECTED NOT DETECTED Final   Haemophilus influenzae NOT DETECTED NOT DETECTED Final   Neisseria meningitidis NOT DETECTED NOT DETECTED Final   Pseudomonas aeruginosa NOT DETECTED NOT DETECTED Final   Candida albicans NOT DETECTED NOT DETECTED Final   Candida glabrata NOT DETECTED NOT DETECTED Final   Candida krusei NOT DETECTED NOT DETECTED Final   Candida parapsilosis NOT DETECTED NOT DETECTED Final   Candida tropicalis NOT DETECTED NOT DETECTED Final    Comment: Performed at [facility], [place] [address] [place], [place] [address]  MRSA PCR Screening     Status: Abnormal   Collection Time: 11/24/17 11:57 PM  Result Value Ref Range Status   MRSA by PCR POSITIVE (A) NEGATIVE Final    Comment:        The GeneXpert MRSA Assay (FDA approved for NASAL specimens only), is one component of a comprehensive MRSA colonization surveillance program. It is not intended to diagnose MRSA infection nor to guide or monitor treatment for MRSA infections. RESULT CALLED TO, READ BACK BY AND VERIFIED WITH: Dorise Bullion RN 11/25/17 0324 JDW          Radiology Studies: Dg Chest Port 1 View  Result Date: 11/24/2017 CLINICAL DATA:  Cough and fever EXAM: PORTABLE CHEST 1 VIEW COMPARISON:  08/28/2017 chest radiograph. FINDINGS: In Stable cardiomediastinal silhouette with normal heart size. No pneumothorax. No pleural effusion. Lungs appear clear, with no acute consolidative airspace disease and no pulmonary edema. IMPRESSION: No active disease. Electronically Signed   By: Ilona Sorrel M.D.   On: 11/24/2017 13:42   Ct Renal Stone Study  Result Date: 11/25/2017 CLINICAL DATA:  Altered mental status and fever. EXAM: CT ABDOMEN AND PELVIS WITHOUT CONTRAST TECHNIQUE: Multidetector CT imaging of the abdomen and pelvis was performed following the standard  protocol without IV contrast. COMPARISON:  12/10/2016 FINDINGS: Lower chest: Dependent atelectasis noted in the lower lobes bilaterally. Hepatobiliary: The liver shows diffusely decreased attenuation suggesting steatosis. Liver measures 23.9 cm craniocaudal length, enlarged. Gallbladder is distended. No intrahepatic or extrahepatic biliary dilation. Pancreas: No focal mass lesion. No dilatation of the main duct. No intraparenchymal cyst. No peripancreatic edema. Spleen: No splenomegaly. No focal mass lesion. Adrenals/Urinary Tract: No adrenal nodule or mass. Multiple stones are seen in the kidneys  bilaterally. Dominant stone in the left kidney fills a lower pole collecting system and measures 1.7 x 0.9 x 2.4 cm. Left ureter is unremarkable. Multiple stones are noted in the right kidney including a posterior interpolar stone measuring 11 x 7 x 16 mm. There is mild fullness of the right intrarenal collecting system with right perinephric edema/inflammation. Several tiny stones are seen in the right renal pelvis. Edema/inflammation is seen around the mildly distended right ureter. 10 x 10 x 8 mm distal right ureteral stone is identified on axial imaging (series 3: Image 73). Foley catheter decompresses the urinary bladder. Stomach/Bowel: Stomach is nondistended. No gastric wall thickening. No evidence of outlet obstruction. Duodenum is normally positioned as is the ligament of Treitz. No small bowel wall thickening. No small bowel dilatation. The terminal ileum is normal. The appendix is not visualized, but there is no edema or inflammation in the region of the cecum. No gross colonic mass. No colonic wall thickening. No substantial diverticular change. Vascular/Lymphatic: There is abdominal aortic atherosclerosis without aneurysm. There is no gastrohepatic or hepatoduodenal ligament lymphadenopathy. No intraperitoneal or retroperitoneal lymphadenopathy. No pelvic sidewall lymphadenopathy. Reproductive: IUD is  visualized in the uterus. There is no adnexal mass. Other: No intraperitoneal free fluid. Musculoskeletal: No worrisome lytic or sclerotic osseous abnormality. IMPRESSION: 1. 10 x 10 x 8 mm stone in the distal right ureter with periureteric edema/inflammation tracking proximally up to the right kidney. There is associated mild fullness of the right intrarenal collecting system and ureter proximal to the stone. Given the history of fever and mental status changes, infection proximal to the obstruction is a distinct consideration. 2. Bilateral nephrolithiasis. 3. Hepatomegaly with hepatic steatosis. 4.  Aortic Atherosclerois (ICD10-170.0) Electronically Signed   By: Misty Stanley M.D.   On: 11/25/2017 10:43        Scheduled Meds: . calcitRIOL  2 mcg Oral Daily  . calcium citrate  200 mg of elemental calcium Oral BID  . Chlorhexidine Gluconate Cloth  6 each Topical Q0600  . diltiazem  240 mg Oral Daily  . levothyroxine  300 mcg Oral QAC breakfast  . loratadine  10 mg Oral QPM  . mometasone-formoterol  2 puff Inhalation BID  . mupirocin ointment  1 application Nasal BID  . pantoprazole  40 mg Oral Daily   Continuous Infusions: . sodium chloride 100 mL/hr at 11/25/17 0945  . sodium chloride 10 mL/hr at 11/25/17 0020  . cefTRIAXone (ROCEPHIN)  IV 1 g (11/25/17 1139)  . fluconazole (DIFLUCAN) IV 400 mg (11/25/17 0212)     LOS: 1 day    Time spent: 35 minutes.     Elmarie Shiley, MD [facility] Pager [phone]  If 7PM-7AM, please contact night-coverage [email or web address] Password [email or web address] 11/25/2017, 1:34 PM

## 2017-11-25 NOTE — Progress Notes (Signed)
PHARMACY - PHYSICIAN COMMUNICATION CRITICAL VALUE ALERT - BLOOD CULTURE IDENTIFICATION (BCID)  Media [name] is an 62 y.o. female who presented to [facility] on 11/24/2017 with a chief complaint of altered mental status and fever  Assessment:  UA positive for UTI. 1/4 blood cultures shows E.coli (no resistance patterns) on BCID. Cultures remains pending. Likely source is urine  Name of physician (or Provider) Contacted: Dr. Tyrell Antonio   Current antibiotics: Cefepime & Vancomycin   Changes to prescribed antibiotics recommended: De-escalate cefepime to ceftriaxone and discontinue vancomycin  Recommendations accepted by provider  Results for orders placed or performed during the hospital encounter of 11/24/17  Blood Culture ID Panel (Reflexed) (Collected: 11/24/2017  2:58 PM)  Result Value Ref Range   Enterococcus species NOT DETECTED NOT DETECTED   Listeria monocytogenes NOT DETECTED NOT DETECTED   Staphylococcus species NOT DETECTED NOT DETECTED   Staphylococcus aureus NOT DETECTED NOT DETECTED   Streptococcus species NOT DETECTED NOT DETECTED   Streptococcus agalactiae NOT DETECTED NOT DETECTED   Streptococcus pneumoniae NOT DETECTED NOT DETECTED   Streptococcus pyogenes NOT DETECTED NOT DETECTED   Acinetobacter baumannii NOT DETECTED NOT DETECTED   Enterobacteriaceae species DETECTED (A) NOT DETECTED   Enterobacter cloacae complex NOT DETECTED NOT DETECTED   Escherichia coli DETECTED (A) NOT DETECTED   Klebsiella oxytoca NOT DETECTED NOT DETECTED   Klebsiella pneumoniae NOT DETECTED NOT DETECTED   Proteus species NOT DETECTED NOT DETECTED   Serratia marcescens NOT DETECTED NOT DETECTED   Carbapenem resistance NOT DETECTED NOT DETECTED   Haemophilus influenzae NOT DETECTED NOT DETECTED   Neisseria meningitidis NOT DETECTED NOT DETECTED   Pseudomonas aeruginosa NOT DETECTED NOT DETECTED   Candida albicans NOT DETECTED NOT DETECTED   Candida glabrata NOT DETECTED NOT DETECTED   Candida krusei NOT DETECTED NOT DETECTED   Candida parapsilosis NOT DETECTED NOT DETECTED   Candida tropicalis NOT DETECTED NOT DETECTED    Albertina Parr, PharmD., BCPS Clinical Pharmacist Clinical phone for 11/25/17 until 3:30pm: [phone] If after 3:30pm, please refer to [facility] for unit-specific pharmacist

## 2017-11-25 NOTE — Anesthesia Postprocedure Evaluation (Signed)
Anesthesia Post Note  Patient: [name]  Procedure(s) Performed: CYSTOSCOPY WITH RETROGRADE RIGHT URETERAL STENT PLACEMENT (Right Ureter)     Patient location during evaluation: PACU Anesthesia Type: General Level of consciousness: awake and alert, oriented and patient cooperative Pain management: pain level controlled Vital Signs Assessment: post-procedure vital signs reviewed and stable Respiratory status: spontaneous breathing, nonlabored ventilation and respiratory function stable Cardiovascular status: blood pressure returned to baseline and stable Postop Assessment: no apparent nausea or vomiting Anesthetic complications: no    Last Vitals:  Vitals:   11/25/17 2214 11/25/17 2230  BP: (!) 137/59 (!) 118/58  Pulse: (!) 110 95  Resp: 20 17  Temp: (!) 38.9 C (!) 38.9 C  SpO2: 94% 95%    Last Pain:  Vitals:   11/25/17 2230  TempSrc:   PainSc: 0-No pain                 Payten Hobin,E. Adriann Thau

## 2017-11-26 ENCOUNTER — Encounter (HOSPITAL_COMMUNITY): Payer: Self-pay | Admitting: Urology

## 2017-11-26 DIAGNOSIS — I480 Paroxysmal atrial fibrillation: Secondary | ICD-10-CM

## 2017-11-26 LAB — DIC (DISSEMINATED INTRAVASCULAR COAGULATION)PANEL
D-Dimer, Quant: 1.63 ug/mL-FEU — ABNORMAL HIGH (ref 0.00–0.50)
INR: 1.35
Platelets: UNDETERMINED 10*3/uL (ref 150–400)
Smear Review: NONE SEEN
aPTT: 38 seconds — ABNORMAL HIGH (ref 24–36)

## 2017-11-26 LAB — BASIC METABOLIC PANEL
Anion gap: 9 (ref 5–15)
BUN: 11 mg/dL (ref 6–20)
CALCIUM: 6.9 mg/dL — AB (ref 8.9–10.3)
CO2: 23 mmol/L (ref 22–32)
CREATININE: 0.82 mg/dL (ref 0.44–1.00)
Chloride: 110 mmol/L (ref 98–111)
GFR calc Af Amer: >60 mL/min (ref >60)
Glucose, Bld: 142 mg/dL — ABNORMAL HIGH (ref 70–99)
POTASSIUM: 3.9 mmol/L (ref 3.5–5.1)
SODIUM: 142 mmol/L (ref 135–145)

## 2017-11-26 LAB — URINE CULTURE

## 2017-11-26 LAB — CBC
HCT: 40.3 % (ref 36.0–46.0)
HEMATOCRIT: 39.6 % (ref 36.0–46.0)
Hemoglobin: 12.5 g/dL (ref 12.0–15.0)
Hemoglobin: 12.3 g/dL (ref 12.0–15.0)
MCH: 28.3 pg (ref 26.0–34.0)
MCH: 28.3 pg (ref 26.0–34.0)
MCHC: 31 g/dL (ref 30.0–36.0)
MCHC: 31.1 g/dL (ref 30.0–36.0)
MCV: 91.2 fL (ref 78.0–100.0)
MCV: 91 fL (ref 78.0–100.0)
PLATELETS: UNDETERMINED 10*3/uL (ref 150–400)
Platelets: DECREASED 10*3/uL (ref 150–400)
RBC: 4.42 MIL/uL (ref 3.87–5.11)
RBC: 4.35 MIL/uL (ref 3.87–5.11)
RDW: 15.3 % (ref 11.5–15.5)
RDW: 15.1 % (ref 11.5–15.5)
WBC: 10.6 10*3/uL — ABNORMAL HIGH (ref 4.0–10.5)
WBC: 11.7 10*3/uL — ABNORMAL HIGH (ref 4.0–10.5)

## 2017-11-26 LAB — RPR: RPR Ser Ql: NONREACTIVE

## 2017-11-26 LAB — DIC (DISSEMINATED INTRAVASCULAR COAGULATION) PANEL
FIBRINOGEN: 756 mg/dL — AB (ref 210–475)
PROTHROMBIN TIME: 16.5 s — AB (ref 11.4–15.2)

## 2017-11-26 LAB — HIV ANTIBODY (ROUTINE TESTING W REFLEX): HIV SCREEN 4TH GENERATION: NONREACTIVE

## 2017-11-26 MED ORDER — FLUCONAZOLE 200 MG PO TABS
400.0000 mg | ORAL_TABLET | Freq: Every day | ORAL | Status: DC
  Administered 2017-11-26 – 2017-11-28 (×3): 400 mg via ORAL
  Filled 2017-11-26 (×3): qty 2

## 2017-11-26 MED ORDER — NYSTATIN 100000 UNIT/GM EX POWD
Freq: Three times a day (TID) | CUTANEOUS | Status: DC
  Administered 2017-11-26 – 2017-11-29 (×9): via TOPICAL
  Filled 2017-11-26: qty 15

## 2017-11-26 MED ORDER — SACCHAROMYCES BOULARDII 250 MG PO CAPS
250.0000 mg | ORAL_CAPSULE | Freq: Two times a day (BID) | ORAL | Status: DC
  Administered 2017-11-26 – 2017-11-29 (×7): 250 mg via ORAL
  Filled 2017-11-26 (×7): qty 1

## 2017-11-26 MED ORDER — DOXYCYCLINE HYCLATE 100 MG PO TABS
100.0000 mg | ORAL_TABLET | Freq: Two times a day (BID) | ORAL | Status: DC
  Administered 2017-11-26 – 2017-11-29 (×7): 100 mg via ORAL
  Filled 2017-11-26 (×7): qty 1

## 2017-11-26 NOTE — Progress Notes (Addendum)
PROGRESS NOTE    [name]  EVO:[id number] DOB: 03/18/1956 DOA: 11/24/2017 PCP: Debbrah Alar, NP    Brief Narrative: [name]  is a 62 y.o. female, [name] , HTN and morbid obesity presenting acute onset of altered mental status with fever at home.  She has been gradually getting weak however today the patient family noticed extreme confusion with nausea and vomiting and fever.  Patient received IV fluids in the emergency room and improved and her mental status gradually improved as well.  Code sepsis was called and patient received IV antibiotics as well.  She is alert awake and can converse at this time.  She denies any preceding chest pains shortness of breath or urinary symptoms.  In the emergency room she had to be catheterized and the urine analysis was positive for UTI.     Assessment & Plan:   Active Problems:   Hypothyroidism   Atrial fibrillation ([place])   Sepsis ([place])   1-Sepsis; secondary to UTI> right ureter stone.  E coli Bacteremia;  Continue with IV fluids, IV bolus.  Change antibiotics to ceftriaxone. dioscorine vancomycin cefepime.  CT ; 10 x 10 x 8 mm stone in the distal right ureter with periureteric edema/inflammation tracking proximally up to the right kidney. There is associated mild fullness of the right intrarenal collecting system and ureter proximal to the stone. Given the history of fever and mental status changes, infection proximal to the obstruction is a distinct consideration. Bilateral nephrolithiasis. 3. Hepatomegaly with hepatic steatosis. -urology consulted. Patient made NPO. Underwent ureteral stent placement 7-03.  WBC decreased to 10. Blood culture still pending.  Continue with ceftriaxone.  Repeat Blood culture tomorrow.   2-UTI, urine retention.  Plan to remove foley catheter today   3-Acute encephalopathy, toxic. Related to infection.  Resolved  4-Thrombocytopenia; suspect related to infection.  Discussed with hematology, will get DIC panel and CBC on citrate   5-A fib; hold eliquis due to severe thrombocytopenia.  On Cardizem, holder parameter for BP.   6-Hypocalcemia. Continue with  supplementation   7-Palmar rash, also thigh back.  RPR negative. Will add doxy to cover for skin infection.   DVT prophylaxis: hold eliquis due to thrombocytopenia.  Code Status: full code.  Family Communication: care discussed with patient.  Disposition Plan: home when stable  Consultants:   Urology    Procedures:  CT renal;    Antimicrobials:  Received vancomyc and cefepime.   Ceftriaxone 7-03   Subjective: Feels better today.  No significant back pain.    Objective: Vitals:   11/26/17 0000 11/26/17 0001 11/26/17 0552 11/26/17 0756  BP: 112/60  (!) 107/56   Pulse: 92  75   Resp:   15   Temp:  98.4 F (36.9 C) 98.3 F (36.8 C)   TempSrc:  Oral Oral   SpO2:   97% 97%  Weight:   (!) 153.2 kg (337 lb 11.9 oz)   Height:        Intake/Output Summary (Last 24 hours) at 11/26/2017 0841 Last data filed at 11/26/2017 0600 Gross per 24 hour  Intake 4130.69 ml  Output 2260 ml  Net 1870.69 ml   Filed Weights   11/25/17 0614 11/25/17 2040 11/26/17 0552  Weight: (!) 152.9 kg (337 lb 1.3 oz) (!) 152.9 kg (337 lb 1.3 oz) (!) 153.2 kg (337 lb 11.9 oz)    Examination:  General exam: NAD Respiratory system: CTA Cardiovascular system: S 1, S 2 RRR  Gastrointestinal system: BS present, soft, nt Central nervous system: non focal.  Extremities: Symmetric power.  Skin: palmar rash      Data Reviewed: I have personally reviewed following labs and imaging studies  CBC: Recent Labs  Lab 11/24/17 1431 11/25/17 0106 11/25/17 0959 11/26/17 0637  WBC 21.9* 26.6* 19.6* 10.6*  NEUTROABS 21.1*  --   --   --   HGB 13.9 12.4 12.5 12.5  HCT 44.0 39.5 39.9 40.3  MCV 90.5 90.6 91.1 91.2  PLT PLATELET CLUMPS NOTED ON SMEAR, COUNT APPEARS DECREASED 38* PLATELET CLUMPS NOTED ON  SMEAR, UNABLE TO ESTIMATE PLATELET CLUMPS NOTED ON SMEAR, UNABLE TO ESTIMATE   Basic Metabolic Panel: Recent Labs  Lab 11/24/17 1431 11/25/17 0106 11/26/17 0637  NA 140 141 142  K 3.3* 3.5 3.9  CL 100 108 110  CO2 23 24 23   GLUCOSE 104* 112* 142*  BUN 23* 18 11  CREATININE 1.20* 0.95 0.82  CALCIUM 7.5* 6.5* 6.9*   GFR: Estimated Creatinine Clearance: 108.4 mL/min (by C-G formula based on SCr of 0.82 mg/dL). Liver Function Tests: Recent Labs  Lab 11/24/17 1431  AST 27  ALT 37  ALKPHOS 102  BILITOT 1.1  PROT 6.0*  ALBUMIN 3.1*   No results for input(s): LIPASE, AMYLASE in the last 168 hours. No results for input(s): AMMONIA in the last 168 hours. Coagulation Profile: No results for input(s): INR, PROTIME in the last 168 hours. Cardiac Enzymes: No results for input(s): CKTOTAL, CKMB, CKMBINDEX, TROPONINI in the last 168 hours. BNP (last 3 results) Recent Labs    03/20/17 1420 08/28/17 0930  PROBNP 25.0 17.0   HbA1C: No results for input(s): HGBA1C in the last 72 hours. CBG: No results for input(s): GLUCAP in the last 168 hours. Lipid Profile: No results for input(s): CHOL, HDL, LDLCALC, TRIG, CHOLHDL, LDLDIRECT in the last 72 hours. Thyroid Function Tests: Recent Labs    11/25/17 0106  TSH 0.631   Anemia Panel: No results for input(s): VITAMINB12, FOLATE, FERRITIN, TIBC, IRON, RETICCTPCT in the last 72 hours. Sepsis Labs: Recent Labs  Lab 11/24/17 2017 11/24/17 2024 11/25/17 0959 11/25/17 1147  LATICACIDVEN 3.1* 3.02* 1.4 1.1    Recent Results (from the past 240 hour(s))  Blood Culture (routine x 2)     Status: None (Preliminary result)   Collection Time: 11/24/17  1:25 PM  Result Value Ref Range Status   Specimen Description BLOOD LEFT FOREARM  Final   Special Requests   Final    BOTTLES DRAWN AEROBIC AND ANAEROBIC Blood Culture adequate volume   Culture   Final    NO GROWTH 1 DAY Performed at [facility], [place] [address]  [place], [place] [address]    Report Status PENDING  Incomplete  Blood Culture (routine x 2)     Status: None (Preliminary result)   Collection Time: 11/24/17  2:58 PM  Result Value Ref Range Status   Specimen Description BLOOD LEFT HAND  Final   Special Requests   Final    BOTTLES DRAWN AEROBIC AND ANAEROBIC Blood Culture adequate volume   Culture  Setup Time   Final    ANAEROBIC BOTTLE ONLY GRAM NEGATIVE RODS CRITICAL RESULT CALLED TO, READ BACK BY AND VERIFIED WITH: Asencion Islam PharmD 10:30 11/25/17 ([email or web address]) Performed at [facility], [address] [place],  [address]    Culture GRAM NEGATIVE RODS  Final   Report Status PENDING  Incomplete  Blood Culture ID Panel (Reflexed)  Status: Abnormal   Collection Time: 11/24/17  2:58 PM  Result Value Ref Range Status   Enterococcus species NOT DETECTED NOT DETECTED Final   Listeria monocytogenes NOT DETECTED NOT DETECTED Final   Staphylococcus species NOT DETECTED NOT DETECTED Final   Staphylococcus aureus NOT DETECTED NOT DETECTED Final   Streptococcus species NOT DETECTED NOT DETECTED Final   Streptococcus agalactiae NOT DETECTED NOT DETECTED Final   Streptococcus pneumoniae NOT DETECTED NOT DETECTED Final   Streptococcus pyogenes NOT DETECTED NOT DETECTED Final   Acinetobacter baumannii NOT DETECTED NOT DETECTED Final   Enterobacteriaceae species DETECTED (A) NOT DETECTED Final    Comment: Enterobacteriaceae represent a large family of gram-negative bacteria, not a single organism. CRITICAL RESULT CALLED TO, READ BACK BY AND VERIFIED WITH: Jacinto Reap Mancheril PharmD 10:30 11/25/17 ([email or web address])    Enterobacter cloacae complex NOT DETECTED NOT DETECTED Final   Escherichia coli DETECTED (A) NOT DETECTED Final    Comment: CRITICAL RESULT CALLED TO, READ BACK BY AND VERIFIED WITH: Asencion Islam PharmD 10:30 11/25/17 ([email or web address])    Klebsiella oxytoca NOT DETECTED NOT DETECTED Final   Klebsiella pneumoniae NOT DETECTED NOT DETECTED Final    Proteus species NOT DETECTED NOT DETECTED Final   Serratia marcescens NOT DETECTED NOT DETECTED Final   Carbapenem resistance NOT DETECTED NOT DETECTED Final   Haemophilus influenzae NOT DETECTED NOT DETECTED Final   Neisseria meningitidis NOT DETECTED NOT DETECTED Final   Pseudomonas aeruginosa NOT DETECTED NOT DETECTED Final   Candida albicans NOT DETECTED NOT DETECTED Final   Candida glabrata NOT DETECTED NOT DETECTED Final   Candida krusei NOT DETECTED NOT DETECTED Final   Candida parapsilosis NOT DETECTED NOT DETECTED Final   Candida tropicalis NOT DETECTED NOT DETECTED Final    Comment: Performed at [facility], [place] [address], [place], [place] [address]  MRSA PCR Screening     Status: Abnormal   Collection Time: 11/24/17 11:57 PM  Result Value Ref Range Status   MRSA by PCR POSITIVE (A) NEGATIVE Final    Comment:        The GeneXpert MRSA Assay (FDA approved for NASAL specimens only), is one component of a comprehensive MRSA colonization surveillance program. It is not intended to diagnose MRSA infection nor to guide or monitor treatment for MRSA infections. RESULT CALLED TO, READ BACK BY AND VERIFIED WITH: Dorise Bullion RN 11/25/17 0324 JDW          Radiology Studies: Dg Chest Port 1 View  Result Date: 11/24/2017 CLINICAL DATA:  Cough and fever EXAM: PORTABLE CHEST 1 VIEW COMPARISON:  08/28/2017 chest radiograph. FINDINGS: In Stable cardiomediastinal silhouette with normal heart size. No pneumothorax. No pleural effusion. Lungs appear clear, with no acute consolidative airspace disease and no pulmonary edema. IMPRESSION: No active disease. Electronically Signed   By: Ilona Sorrel M.D.   On: 11/24/2017 13:42   Dg C-arm 1-60 Min-no Report  Result Date: 11/25/2017 Fluoroscopy was utilized by the requesting physician.  No radiographic interpretation.   Ct Renal Stone Study  Result Date: 11/25/2017 CLINICAL DATA:  Altered mental status and fever. EXAM: CT ABDOMEN AND  PELVIS WITHOUT CONTRAST TECHNIQUE: Multidetector CT imaging of the abdomen and pelvis was performed following the standard protocol without IV contrast. COMPARISON:  12/10/2016 FINDINGS: Lower chest: Dependent atelectasis noted in the lower lobes bilaterally. Hepatobiliary: The liver shows diffusely decreased attenuation suggesting steatosis. Liver measures 23.9 cm craniocaudal length, enlarged. Gallbladder is distended. No intrahepatic or extrahepatic biliary dilation. Pancreas: No focal mass lesion.  No dilatation of the main duct. No intraparenchymal cyst. No peripancreatic edema. Spleen: No splenomegaly. No focal mass lesion. Adrenals/Urinary Tract: No adrenal nodule or mass. Multiple stones are seen in the kidneys bilaterally. Dominant stone in the left kidney fills a lower pole collecting system and measures 1.7 x 0.9 x 2.4 cm. Left ureter is unremarkable. Multiple stones are noted in the right kidney including a posterior interpolar stone measuring 11 x 7 x 16 mm. There is mild fullness of the right intrarenal collecting system with right perinephric edema/inflammation. Several tiny stones are seen in the right renal pelvis. Edema/inflammation is seen around the mildly distended right ureter. 10 x 10 x 8 mm distal right ureteral stone is identified on axial imaging (series 3: Image 73). Foley catheter decompresses the urinary bladder. Stomach/Bowel: Stomach is nondistended. No gastric wall thickening. No evidence of outlet obstruction. Duodenum is normally positioned as is the ligament of Treitz. No small bowel wall thickening. No small bowel dilatation. The terminal ileum is normal. The appendix is not visualized, but there is no edema or inflammation in the region of the cecum. No gross colonic mass. No colonic wall thickening. No substantial diverticular change. Vascular/Lymphatic: There is abdominal aortic atherosclerosis without aneurysm. There is no gastrohepatic or hepatoduodenal ligament  lymphadenopathy. No intraperitoneal or retroperitoneal lymphadenopathy. No pelvic sidewall lymphadenopathy. Reproductive: IUD is visualized in the uterus. There is no adnexal mass. Other: No intraperitoneal free fluid. Musculoskeletal: No worrisome lytic or sclerotic osseous abnormality. IMPRESSION: 1. 10 x 10 x 8 mm stone in the distal right ureter with periureteric edema/inflammation tracking proximally up to the right kidney. There is associated mild fullness of the right intrarenal collecting system and ureter proximal to the stone. Given the history of fever and mental status changes, infection proximal to the obstruction is a distinct consideration. 2. Bilateral nephrolithiasis. 3. Hepatomegaly with hepatic steatosis. 4.  Aortic Atherosclerois (ICD10-170.0) Electronically Signed   By: Misty Stanley M.D.   On: 11/25/2017 10:43        Scheduled Meds: . calcitRIOL  2 mcg Oral Daily  . calcium citrate  200 mg of elemental calcium Oral BID  . Chlorhexidine Gluconate Cloth  6 each Topical Q0600  . diltiazem  240 mg Oral Daily  . doxycycline  100 mg Oral Q12H  . levothyroxine  300 mcg Oral QAC breakfast  . loratadine  10 mg Oral QPM  . mometasone-formoterol  2 puff Inhalation BID  . mupirocin ointment  1 application Nasal BID  . nystatin   Topical TID  . pantoprazole  40 mg Oral Daily  . saccharomyces boulardii  250 mg Oral BID   Continuous Infusions: . sodium chloride 100 mL/hr at 11/26/17 0837  . sodium chloride 10 mL/hr at 11/25/17 1900  . cefTRIAXone (ROCEPHIN)  IV Stopped (11/25/17 1209)  . fluconazole (DIFLUCAN) IV 400 mg (11/25/17 2336)  . lactated ringers Stopped (11/26/17 0838)     LOS: 2 days    Time spent: 35 minutes.     Elmarie Shiley, MD [facility] Pager [phone]  If 7PM-7AM, please contact night-coverage [email or web address] Password [email or web address] 11/26/2017, 8:41 AM

## 2017-11-26 NOTE — H&P (View-Only) (Signed)
Urology Inpatient Progress Report  weakness,hypotension  Procedure(s): CYSTOSCOPY WITH RETROGRADE RIGHT URETERAL STENT PLACEMENT  1 Day Post-Op   Intv/Subj: Following stent placement the patient has made significant progress.  She is now hemodynamically stable.  She feels significantly better.  She is complaining of only a sore throat.  Her flank pain has improved.  She has been afebrile.  She is eating without nausea or vomiting.   Active Problems:   Hypothyroidism   Atrial fibrillation ([place])   Sepsis ([place])  Current Facility-Administered Medications  Medication Dose Route Frequency Provider Last Rate Last Dose  . 0.9 %  sodium chloride infusion   Intravenous Continuous Franchot Gallo, MD 100 mL/hr at 11/26/17 0837    . 0.9 %  sodium chloride infusion   Intravenous Continuous Franchot Gallo, MD 10 mL/hr at 11/25/17 1900    . acetaminophen (TYLENOL) tablet 650 mg  650 mg Oral Q6H PRN Franchot Gallo, MD      . albuterol (PROVENTIL) (2.5 MG/3ML) 0.083% nebulizer solution 3 mL  3 mL Inhalation Q6H PRN Franchot Gallo, MD   3 mL at 11/25/17 2345  . calcitRIOL (ROCALTROL) capsule 2 mcg  2 mcg Oral Daily Franchot Gallo, MD   2 mcg at 11/26/17 0905  . calcium citrate (CALCITRATE - dosed in mg elemental calcium) tablet 200 mg of elemental calcium  200 mg of elemental calcium Oral BID Franchot Gallo, MD   200 mg of elemental calcium at 11/26/17 0905  . cefTRIAXone (ROCEPHIN) 1 g in sodium chloride 0.9 % 100 mL IVPB  1 g Intravenous Q24H Franchot Gallo, MD 200 mL/hr at 11/26/17 0916 1 g at 11/26/17 0916  . Chlorhexidine Gluconate Cloth 2 % PADS 6 each  6 each Topical Q0600 Franchot Gallo, MD   6 each at 11/26/17 [phone]  . diltiazem (CARDIZEM CD) 24 hr capsule 240 mg  240 mg Oral Daily Franchot Gallo, MD   240 mg at 11/26/17 0906  . diphenhydrAMINE (BENADRYL) capsule 25 mg  25 mg Oral Q8H PRN Franchot Gallo, MD      . doxycycline (VIBRA-TABS) tablet 100 mg  100  mg Oral Q12H Regalado, Belkys A, MD   100 mg at 11/26/17 0906  . fluconazole (DIFLUCAN) tablet 400 mg  400 mg Oral Daily Regalado, Belkys A, MD      . HYDROcodone-acetaminophen (NORCO/VICODIN) 5-325 MG per tablet 1 tablet  1 tablet Oral Q6H PRN Franchot Gallo, MD      . HYDROcodone-acetaminophen (NORCO/VICODIN) 5-325 MG per tablet 2 tablet  2 tablet Oral Q6H PRN Franchot Gallo, MD   2 tablet at 11/25/17 2345  . lactated ringers infusion   Intravenous Continuous Franchot Gallo, MD   Stopped at 11/26/17 [phone]  . levothyroxine (SYNTHROID, LEVOTHROID) tablet 300 mcg  300 mcg Oral QAC breakfast Franchot Gallo, MD   300 mcg at 11/26/17 0650  . loratadine (CLARITIN) tablet 10 mg  10 mg Oral QPM Franchot Gallo, MD   10 mg at 11/25/17 1722  . mometasone-formoterol (DULERA) 200-5 MCG/ACT inhaler 2 puff  2 puff Inhalation BID Franchot Gallo, MD   2 puff at 11/26/17 0756  . mupirocin ointment (BACTROBAN) 2 % 1 application  1 application Nasal BID Franchot Gallo, MD   1 application at 50/27/74 [phone]  . nystatin (MYCOSTATIN/NYSTOP) topical powder   Topical TID Regalado, Belkys A, MD      . ondansetron (ZOFRAN) tablet 4 mg  4 mg Oral Q6H PRN Franchot Gallo, MD       Or  .  ondansetron (ZOFRAN) injection 4 mg  4 mg Intravenous Q6H PRN Franchot Gallo, MD      . pantoprazole (PROTONIX) EC tablet 40 mg  40 mg Oral Daily Franchot Gallo, MD   40 mg at 11/26/17 0905  . saccharomyces boulardii (FLORASTOR) capsule 250 mg  250 mg Oral BID Regalado, Belkys A, MD   250 mg at 11/26/17 0906     Objective: Vital: Vitals:   11/26/17 0001 11/26/17 0552 11/26/17 0756 11/26/17 0912  BP:  (!) 107/56  110/60  Pulse:  75  74  Resp:  15  16  Temp: 98.4 F (36.9 C) 98.3 F (36.8 C)  (!) 97.2 F (36.2 C)  TempSrc: Oral Oral  Oral  SpO2:  97% 97% 95%  Weight:  (!) 153.2 kg (337 lb 11.9 oz)    Height:       I/Os: I/O last 3 completed shifts: In: 5309.3 [P.O.:720; I.V.:3277.4; IV  Piggyback:1311.9] Out: 4660 [Urine:4650; Blood:10]  Physical Exam:  General: Patient is in no apparent distress Lungs: Normal respiratory effort, chest expands symmetrically. GI: The abdomen is soft and nontender without mass. Ext: lower extremities symmetric  Lab Results: Recent Labs    11/25/17 0106 11/25/17 0959 11/26/17 0637  WBC 26.6* 19.6* 10.6*  HGB 12.4 12.5 12.5  HCT 39.5 39.9 40.3   Recent Labs    11/24/17 1431 11/25/17 0106 11/26/17 0637  NA 140 141 142  K 3.3* 3.5 3.9  CL 100 108 110  CO2 23 24 23   GLUCOSE 104* 112* 142*  BUN 23* 18 11  CREATININE 1.20* 0.95 0.82  CALCIUM 7.5* 6.5* 6.9*   Recent Labs    11/26/17 0837  INR 1.35   No results for input(s): LABURIN in the last 72 hours. Results for orders placed or performed during the hospital encounter of 11/24/17  Blood Culture (routine x 2)     Status: None (Preliminary result)   Collection Time: 11/24/17  1:25 PM  Result Value Ref Range Status   Specimen Description BLOOD LEFT FOREARM  Final   Special Requests   Final    BOTTLES DRAWN AEROBIC AND ANAEROBIC Blood Culture adequate volume   Culture   Final    NO GROWTH 1 DAY Performed at [facility], [place] [address] [place], [place] [address]    Report Status PENDING  Incomplete  Blood Culture (routine x 2)     Status: Abnormal (Preliminary result)   Collection Time: 11/24/17  2:58 PM  Result Value Ref Range Status   Specimen Description BLOOD LEFT HAND  Final   Special Requests   Final    BOTTLES DRAWN AEROBIC AND ANAEROBIC Blood Culture adequate volume   Culture  Setup Time   Final    ANAEROBIC BOTTLE ONLY GRAM NEGATIVE RODS CRITICAL RESULT CALLED TO, READ BACK BY AND VERIFIED WITH: Asencion Islam PharmD 10:30 11/25/17 ([email or web address])    Culture (A)  Final    ESCHERICHIA COLI SUSCEPTIBILITIES TO FOLLOW Performed at [facility], [place] [address] [place], [place] [address]    Report Status PENDING  Incomplete  Blood Culture ID Panel  (Reflexed)     Status: Abnormal   Collection Time: 11/24/17  2:58 PM  Result Value Ref Range Status   Enterococcus species NOT DETECTED NOT DETECTED Final   Listeria monocytogenes NOT DETECTED NOT DETECTED Final   Staphylococcus species NOT DETECTED NOT DETECTED Final   Staphylococcus aureus NOT DETECTED NOT DETECTED Final   Streptococcus species NOT DETECTED  NOT DETECTED Final   Streptococcus agalactiae NOT DETECTED NOT DETECTED Final   Streptococcus pneumoniae NOT DETECTED NOT DETECTED Final   Streptococcus pyogenes NOT DETECTED NOT DETECTED Final   Acinetobacter baumannii NOT DETECTED NOT DETECTED Final   Enterobacteriaceae species DETECTED (A) NOT DETECTED Final    Comment: Enterobacteriaceae represent a large family of gram-negative bacteria, not a single organism. CRITICAL RESULT CALLED TO, READ BACK BY AND VERIFIED WITH: Jacinto Reap Mancheril PharmD 10:30 11/25/17 ([email or web address])    Enterobacter cloacae complex NOT DETECTED NOT DETECTED Final   Escherichia coli DETECTED (A) NOT DETECTED Final    Comment: CRITICAL RESULT CALLED TO, READ BACK BY AND VERIFIED WITH: Asencion Islam PharmD 10:30 11/25/17 ([email or web address])    Klebsiella oxytoca NOT DETECTED NOT DETECTED Final   Klebsiella pneumoniae NOT DETECTED NOT DETECTED Final   Proteus species NOT DETECTED NOT DETECTED Final   Serratia marcescens NOT DETECTED NOT DETECTED Final   Carbapenem resistance NOT DETECTED NOT DETECTED Final   Haemophilus influenzae NOT DETECTED NOT DETECTED Final   Neisseria meningitidis NOT DETECTED NOT DETECTED Final   Pseudomonas aeruginosa NOT DETECTED NOT DETECTED Final   Candida albicans NOT DETECTED NOT DETECTED Final   Candida glabrata NOT DETECTED NOT DETECTED Final   Candida krusei NOT DETECTED NOT DETECTED Final   Candida parapsilosis NOT DETECTED NOT DETECTED Final   Candida tropicalis NOT DETECTED NOT DETECTED Final    Comment: Performed at [facility], [place] [address], [place], [place] [address]  Urine  culture     Status: Abnormal   Collection Time: 11/24/17  7:45 PM  Result Value Ref Range Status   Specimen Description URINE, CLEAN CATCH  Final   Special Requests   Final    NONE Performed at [facility], [place] [address] [place], [place] [address]    Culture MULTIPLE SPECIES PRESENT, SUGGEST RECOLLECTION (A)  Final   Report Status 11/26/2017 FINAL  Final  MRSA PCR Screening     Status: Abnormal   Collection Time: 11/24/17 11:57 PM  Result Value Ref Range Status   MRSA by PCR POSITIVE (A) NEGATIVE Final    Comment:        The GeneXpert MRSA Assay (FDA approved for NASAL specimens only), is one component of a comprehensive MRSA colonization surveillance program. It is not intended to diagnose MRSA infection nor to guide or monitor treatment for MRSA infections. RESULT CALLED TO, READ BACK BY AND VERIFIED WITH: Dorise Bullion RN 11/25/17 7510 JDW     Studies/Results: Dg Chest Port 1 View  Result Date: 11/24/2017 CLINICAL DATA:  Cough and fever EXAM: PORTABLE CHEST 1 VIEW COMPARISON:  08/28/2017 chest radiograph. FINDINGS: In Stable cardiomediastinal silhouette with normal heart size. No pneumothorax. No pleural effusion. Lungs appear clear, with no acute consolidative airspace disease and no pulmonary edema. IMPRESSION: No active disease. Electronically Signed   By: Ilona Sorrel M.D.   On: 11/24/2017 13:42   Dg C-arm 1-60 Min-no Report  Result Date: 11/25/2017 Fluoroscopy was utilized by the requesting physician.  No radiographic interpretation.   Ct Renal Stone Study  Result Date: 11/25/2017 CLINICAL DATA:  Altered mental status and fever. EXAM: CT ABDOMEN AND PELVIS WITHOUT CONTRAST TECHNIQUE: Multidetector CT imaging of the abdomen and pelvis was performed following the standard protocol without IV contrast. COMPARISON:  12/10/2016 FINDINGS: Lower chest: Dependent atelectasis noted in the lower lobes bilaterally. Hepatobiliary: The liver shows diffusely decreased attenuation  suggesting steatosis. Liver measures 23.9 cm craniocaudal length, enlarged. Gallbladder is  distended. No intrahepatic or extrahepatic biliary dilation. Pancreas: No focal mass lesion. No dilatation of the main duct. No intraparenchymal cyst. No peripancreatic edema. Spleen: No splenomegaly. No focal mass lesion. Adrenals/Urinary Tract: No adrenal nodule or mass. Multiple stones are seen in the kidneys bilaterally. Dominant stone in the left kidney fills a lower pole collecting system and measures 1.7 x 0.9 x 2.4 cm. Left ureter is unremarkable. Multiple stones are noted in the right kidney including a posterior interpolar stone measuring 11 x 7 x 16 mm. There is mild fullness of the right intrarenal collecting system with right perinephric edema/inflammation. Several tiny stones are seen in the right renal pelvis. Edema/inflammation is seen around the mildly distended right ureter. 10 x 10 x 8 mm distal right ureteral stone is identified on axial imaging (series 3: Image 73). Foley catheter decompresses the urinary bladder. Stomach/Bowel: Stomach is nondistended. No gastric wall thickening. No evidence of outlet obstruction. Duodenum is normally positioned as is the ligament of Treitz. No small bowel wall thickening. No small bowel dilatation. The terminal ileum is normal. The appendix is not visualized, but there is no edema or inflammation in the region of the cecum. No gross colonic mass. No colonic wall thickening. No substantial diverticular change. Vascular/Lymphatic: There is abdominal aortic atherosclerosis without aneurysm. There is no gastrohepatic or hepatoduodenal ligament lymphadenopathy. No intraperitoneal or retroperitoneal lymphadenopathy. No pelvic sidewall lymphadenopathy. Reproductive: IUD is visualized in the uterus. There is no adnexal mass. Other: No intraperitoneal free fluid. Musculoskeletal: No worrisome lytic or sclerotic osseous abnormality. IMPRESSION: 1. 10 x 10 x 8 mm stone in the  distal right ureter with periureteric edema/inflammation tracking proximally up to the right kidney. There is associated mild fullness of the right intrarenal collecting system and ureter proximal to the stone. Given the history of fever and mental status changes, infection proximal to the obstruction is a distinct consideration. 2. Bilateral nephrolithiasis. 3. Hepatomegaly with hepatic steatosis. 4.  Aortic Atherosclerois (ICD10-170.0) Electronically Signed   By: Misty Stanley M.D.   On: 11/25/2017 10:43    Assessment: Procedure(s): CYSTOSCOPY WITH RETROGRADE RIGHT URETERAL STENT PLACEMENT, 1 Day Post-Op  doing well.  Plan: The patient should be placed on broad-spectrum oral antibiotics, I do not think the urine cultures are going to be particularly helpful.  Further, she does not appear to have bacteremia given her clinical stability, but her blood cultures are still pending.  Our plan moving forward is to have the patient return to urology in 10 days for repeat urine culture to ensure that her infection is cleared.  I will then plan to take her to the operating room for right ureteroscopy to remove the obstructing stone as well as the nonobstructing stones in the right kidney.  We will continue to follow this patient on an as-needed basis.   Louis Meckel, MD Urology 11/26/2017, 12:44 PM

## 2017-11-26 NOTE — Progress Notes (Signed)
Urology Inpatient Progress Report  weakness,hypotension  Procedure(s): CYSTOSCOPY WITH RETROGRADE RIGHT URETERAL STENT PLACEMENT  1 Day Post-Op   Intv/Subj: Following stent placement the patient has made significant progress.  She is now hemodynamically stable.  She feels significantly better.  She is complaining of only a sore throat.  Her flank pain has improved.  She has been afebrile.  She is eating without nausea or vomiting.   Active Problems:   Hypothyroidism   Atrial fibrillation ([place])   Sepsis ([place])  Current Facility-Administered Medications  Medication Dose Route Frequency Provider Last Rate Last Dose  . 0.9 %  sodium chloride infusion   Intravenous Continuous Franchot Gallo, MD 100 mL/hr at 11/26/17 0837    . 0.9 %  sodium chloride infusion   Intravenous Continuous Franchot Gallo, MD 10 mL/hr at 11/25/17 1900    . acetaminophen (TYLENOL) tablet 650 mg  650 mg Oral Q6H PRN Franchot Gallo, MD      . albuterol (PROVENTIL) (2.5 MG/3ML) 0.083% nebulizer solution 3 mL  3 mL Inhalation Q6H PRN Franchot Gallo, MD   3 mL at 11/25/17 2345  . calcitRIOL (ROCALTROL) capsule 2 mcg  2 mcg Oral Daily Franchot Gallo, MD   2 mcg at 11/26/17 0905  . calcium citrate (CALCITRATE - dosed in mg elemental calcium) tablet 200 mg of elemental calcium  200 mg of elemental calcium Oral BID Franchot Gallo, MD   200 mg of elemental calcium at 11/26/17 0905  . cefTRIAXone (ROCEPHIN) 1 g in sodium chloride 0.9 % 100 mL IVPB  1 g Intravenous Q24H Franchot Gallo, MD 200 mL/hr at 11/26/17 0916 1 g at 11/26/17 0916  . Chlorhexidine Gluconate Cloth 2 % PADS 6 each  6 each Topical Q0600 Franchot Gallo, MD   6 each at 11/26/17 [phone]  . diltiazem (CARDIZEM CD) 24 hr capsule 240 mg  240 mg Oral Daily Franchot Gallo, MD   240 mg at 11/26/17 0906  . diphenhydrAMINE (BENADRYL) capsule 25 mg  25 mg Oral Q8H PRN Franchot Gallo, MD      . doxycycline (VIBRA-TABS) tablet 100 mg  100  mg Oral Q12H Regalado, Belkys A, MD   100 mg at 11/26/17 0906  . fluconazole (DIFLUCAN) tablet 400 mg  400 mg Oral Daily Regalado, Belkys A, MD      . HYDROcodone-acetaminophen (NORCO/VICODIN) 5-325 MG per tablet 1 tablet  1 tablet Oral Q6H PRN Franchot Gallo, MD      . HYDROcodone-acetaminophen (NORCO/VICODIN) 5-325 MG per tablet 2 tablet  2 tablet Oral Q6H PRN Franchot Gallo, MD   2 tablet at 11/25/17 2345  . lactated ringers infusion   Intravenous Continuous Franchot Gallo, MD   Stopped at 11/26/17 [phone]  . levothyroxine (SYNTHROID, LEVOTHROID) tablet 300 mcg  300 mcg Oral QAC breakfast Franchot Gallo, MD   300 mcg at 11/26/17 0650  . loratadine (CLARITIN) tablet 10 mg  10 mg Oral QPM Franchot Gallo, MD   10 mg at 11/25/17 1722  . mometasone-formoterol (DULERA) 200-5 MCG/ACT inhaler 2 puff  2 puff Inhalation BID Franchot Gallo, MD   2 puff at 11/26/17 0756  . mupirocin ointment (BACTROBAN) 2 % 1 application  1 application Nasal BID Franchot Gallo, MD   1 application at 76/22/63 [phone]  . nystatin (MYCOSTATIN/NYSTOP) topical powder   Topical TID Regalado, Belkys A, MD      . ondansetron (ZOFRAN) tablet 4 mg  4 mg Oral Q6H PRN Franchot Gallo, MD       Or  .  ondansetron (ZOFRAN) injection 4 mg  4 mg Intravenous Q6H PRN Franchot Gallo, MD      . pantoprazole (PROTONIX) EC tablet 40 mg  40 mg Oral Daily Franchot Gallo, MD   40 mg at 11/26/17 0905  . saccharomyces boulardii (FLORASTOR) capsule 250 mg  250 mg Oral BID Regalado, Belkys A, MD   250 mg at 11/26/17 0906     Objective: Vital: Vitals:   11/26/17 0001 11/26/17 0552 11/26/17 0756 11/26/17 0912  BP:  (!) 107/56  110/60  Pulse:  75  74  Resp:  15  16  Temp: 98.4 F (36.9 C) 98.3 F (36.8 C)  (!) 97.2 F (36.2 C)  TempSrc: Oral Oral  Oral  SpO2:  97% 97% 95%  Weight:  (!) 153.2 kg (337 lb 11.9 oz)    Height:       I/Os: I/O last 3 completed shifts: In: 5309.3 [P.O.:720; I.V.:3277.4; IV  Piggyback:1311.9] Out: 4660 [Urine:4650; Blood:10]  Physical Exam:  General: Patient is in no apparent distress Lungs: Normal respiratory effort, chest expands symmetrically. GI: The abdomen is soft and nontender without mass. Ext: lower extremities symmetric  Lab Results: Recent Labs    11/25/17 0106 11/25/17 0959 11/26/17 0637  WBC 26.6* 19.6* 10.6*  HGB 12.4 12.5 12.5  HCT 39.5 39.9 40.3   Recent Labs    11/24/17 1431 11/25/17 0106 11/26/17 0637  NA 140 141 142  K 3.3* 3.5 3.9  CL 100 108 110  CO2 23 24 23   GLUCOSE 104* 112* 142*  BUN 23* 18 11  CREATININE 1.20* 0.95 0.82  CALCIUM 7.5* 6.5* 6.9*   Recent Labs    11/26/17 0837  INR 1.35   No results for input(s): LABURIN in the last 72 hours. Results for orders placed or performed during the hospital encounter of 11/24/17  Blood Culture (routine x 2)     Status: None (Preliminary result)   Collection Time: 11/24/17  1:25 PM  Result Value Ref Range Status   Specimen Description BLOOD LEFT FOREARM  Final   Special Requests   Final    BOTTLES DRAWN AEROBIC AND ANAEROBIC Blood Culture adequate volume   Culture   Final    NO GROWTH 1 DAY Performed at [facility], [place] [address], [place], [place] [address]    Report Status PENDING  Incomplete  Blood Culture (routine x 2)     Status: Abnormal (Preliminary result)   Collection Time: 11/24/17  2:58 PM  Result Value Ref Range Status   Specimen Description BLOOD LEFT HAND  Final   Special Requests   Final    BOTTLES DRAWN AEROBIC AND ANAEROBIC Blood Culture adequate volume   Culture  Setup Time   Final    ANAEROBIC BOTTLE ONLY GRAM NEGATIVE RODS CRITICAL RESULT CALLED TO, READ BACK BY AND VERIFIED WITH: Asencion Islam PharmD 10:30 11/25/17 ([email or web address])    Culture (A)  Final    ESCHERICHIA COLI SUSCEPTIBILITIES TO FOLLOW Performed at [facility], [place] [address], [place], [place] [address]    Report Status PENDING  Incomplete  Blood Culture ID Panel  (Reflexed)     Status: Abnormal   Collection Time: 11/24/17  2:58 PM  Result Value Ref Range Status   Enterococcus species NOT DETECTED NOT DETECTED Final   Listeria monocytogenes NOT DETECTED NOT DETECTED Final   Staphylococcus species NOT DETECTED NOT DETECTED Final   Staphylococcus aureus NOT DETECTED NOT DETECTED Final   Streptococcus species NOT DETECTED  NOT DETECTED Final   Streptococcus agalactiae NOT DETECTED NOT DETECTED Final   Streptococcus pneumoniae NOT DETECTED NOT DETECTED Final   Streptococcus pyogenes NOT DETECTED NOT DETECTED Final   Acinetobacter baumannii NOT DETECTED NOT DETECTED Final   Enterobacteriaceae species DETECTED (A) NOT DETECTED Final    Comment: Enterobacteriaceae represent a large family of gram-negative bacteria, not a single organism. CRITICAL RESULT CALLED TO, READ BACK BY AND VERIFIED WITH: Jacinto Reap Mancheril PharmD 10:30 11/25/17 ([email or web address])    Enterobacter cloacae complex NOT DETECTED NOT DETECTED Final   Escherichia coli DETECTED (A) NOT DETECTED Final    Comment: CRITICAL RESULT CALLED TO, READ BACK BY AND VERIFIED WITH: Asencion Islam PharmD 10:30 11/25/17 ([email or web address])    Klebsiella oxytoca NOT DETECTED NOT DETECTED Final   Klebsiella pneumoniae NOT DETECTED NOT DETECTED Final   Proteus species NOT DETECTED NOT DETECTED Final   Serratia marcescens NOT DETECTED NOT DETECTED Final   Carbapenem resistance NOT DETECTED NOT DETECTED Final   Haemophilus influenzae NOT DETECTED NOT DETECTED Final   Neisseria meningitidis NOT DETECTED NOT DETECTED Final   Pseudomonas aeruginosa NOT DETECTED NOT DETECTED Final   Candida albicans NOT DETECTED NOT DETECTED Final   Candida glabrata NOT DETECTED NOT DETECTED Final   Candida krusei NOT DETECTED NOT DETECTED Final   Candida parapsilosis NOT DETECTED NOT DETECTED Final   Candida tropicalis NOT DETECTED NOT DETECTED Final    Comment: Performed at [facility], [place] [address] [place], [place] [address]  Urine  culture     Status: Abnormal   Collection Time: 11/24/17  7:45 PM  Result Value Ref Range Status   Specimen Description URINE, CLEAN CATCH  Final   Special Requests   Final    NONE Performed at [facility], [place] [address], [place], [place] [address]    Culture MULTIPLE SPECIES PRESENT, SUGGEST RECOLLECTION (A)  Final   Report Status 11/26/2017 FINAL  Final  MRSA PCR Screening     Status: Abnormal   Collection Time: 11/24/17 11:57 PM  Result Value Ref Range Status   MRSA by PCR POSITIVE (A) NEGATIVE Final    Comment:        The GeneXpert MRSA Assay (FDA approved for NASAL specimens only), is one component of a comprehensive MRSA colonization surveillance program. It is not intended to diagnose MRSA infection nor to guide or monitor treatment for MRSA infections. RESULT CALLED TO, READ BACK BY AND VERIFIED WITH: Dorise Bullion RN 11/25/17 [id number] JDW     Studies/Results: Dg Chest Port 1 View  Result Date: 11/24/2017 CLINICAL DATA:  Cough and fever EXAM: PORTABLE CHEST 1 VIEW COMPARISON:  08/28/2017 chest radiograph. FINDINGS: In Stable cardiomediastinal silhouette with normal heart size. No pneumothorax. No pleural effusion. Lungs appear clear, with no acute consolidative airspace disease and no pulmonary edema. IMPRESSION: No active disease. Electronically Signed   By: Ilona Sorrel M.D.   On: 11/24/2017 13:42   Dg C-arm 1-60 Min-no Report  Result Date: 11/25/2017 Fluoroscopy was utilized by the requesting physician.  No radiographic interpretation.   Ct Renal Stone Study  Result Date: 11/25/2017 CLINICAL DATA:  Altered mental status and fever. EXAM: CT ABDOMEN AND PELVIS WITHOUT CONTRAST TECHNIQUE: Multidetector CT imaging of the abdomen and pelvis was performed following the standard protocol without IV contrast. COMPARISON:  12/10/2016 FINDINGS: Lower chest: Dependent atelectasis noted in the lower lobes bilaterally. Hepatobiliary: The liver shows diffusely decreased attenuation  suggesting steatosis. Liver measures 23.9 cm craniocaudal length, enlarged. Gallbladder is  distended. No intrahepatic or extrahepatic biliary dilation. Pancreas: No focal mass lesion. No dilatation of the main duct. No intraparenchymal cyst. No peripancreatic edema. Spleen: No splenomegaly. No focal mass lesion. Adrenals/Urinary Tract: No adrenal nodule or mass. Multiple stones are seen in the kidneys bilaterally. Dominant stone in the left kidney fills a lower pole collecting system and measures 1.7 x 0.9 x 2.4 cm. Left ureter is unremarkable. Multiple stones are noted in the right kidney including a posterior interpolar stone measuring 11 x 7 x 16 mm. There is mild fullness of the right intrarenal collecting system with right perinephric edema/inflammation. Several tiny stones are seen in the right renal pelvis. Edema/inflammation is seen around the mildly distended right ureter. 10 x 10 x 8 mm distal right ureteral stone is identified on axial imaging (series 3: Image 73). Foley catheter decompresses the urinary bladder. Stomach/Bowel: Stomach is nondistended. No gastric wall thickening. No evidence of outlet obstruction. Duodenum is normally positioned as is the ligament of Treitz. No small bowel wall thickening. No small bowel dilatation. The terminal ileum is normal. The appendix is not visualized, but there is no edema or inflammation in the region of the cecum. No gross colonic mass. No colonic wall thickening. No substantial diverticular change. Vascular/Lymphatic: There is abdominal aortic atherosclerosis without aneurysm. There is no gastrohepatic or hepatoduodenal ligament lymphadenopathy. No intraperitoneal or retroperitoneal lymphadenopathy. No pelvic sidewall lymphadenopathy. Reproductive: IUD is visualized in the uterus. There is no adnexal mass. Other: No intraperitoneal free fluid. Musculoskeletal: No worrisome lytic or sclerotic osseous abnormality. IMPRESSION: 1. 10 x 10 x 8 mm stone in the  distal right ureter with periureteric edema/inflammation tracking proximally up to the right kidney. There is associated mild fullness of the right intrarenal collecting system and ureter proximal to the stone. Given the history of fever and mental status changes, infection proximal to the obstruction is a distinct consideration. 2. Bilateral nephrolithiasis. 3. Hepatomegaly with hepatic steatosis. 4.  Aortic Atherosclerois (ICD10-170.0) Electronically Signed   By: Misty [place] M.D.   On: 11/25/2017 10:43    Assessment: Procedure(s): CYSTOSCOPY WITH RETROGRADE RIGHT URETERAL STENT PLACEMENT, 1 Day Post-Op  doing well.  Plan: The patient should be placed on broad-spectrum oral antibiotics, I do not think the urine cultures are going to be particularly helpful.  Further, she does not appear to have bacteremia given her clinical stability, but her blood cultures are still pending.  Our plan moving forward is to have the patient return to urology in 10 days for repeat urine culture to ensure that her infection is cleared.  I will then plan to take her to the operating room for right ureteroscopy to remove the obstructing stone as well as the nonobstructing stones in the right kidney.  We will continue to follow this patient on an as-needed basis.   Louis Meckel, MD Urology 11/26/2017, 12:44 PM

## 2017-11-27 LAB — PLATELET COUNT: Platelets: UNDETERMINED 10*3/uL (ref 150–400)

## 2017-11-27 LAB — CULTURE, BLOOD (ROUTINE X 2): SPECIAL REQUESTS: ADEQUATE

## 2017-11-27 LAB — PATHOLOGIST SMEAR REVIEW: PATH REVIEW: REACTIVE

## 2017-11-27 MED ORDER — CEFAZOLIN SODIUM-DEXTROSE 2-4 GM/100ML-% IV SOLN
2.0000 g | Freq: Three times a day (TID) | INTRAVENOUS | Status: DC
  Filled 2017-11-27: qty 100

## 2017-11-27 NOTE — Progress Notes (Signed)
Pharmacy Antibiotic Note  [name] is a 62 y.o. female admitted on 11/24/2017 with sepsis.  Pharmacy has been consulted for vancomycin and Zosyn dosing - now changed to cefazolin and doxycycline. Was started on fluconazole for esophageal candidiasis for 14 days.   WBC continues to decrease from 21.9 to 11.7 yesterday. Afebrile last 24 hours. Scr has improved from 1.2 at admission - 0.82 today. Fluconazole changed to PO yesterday given improvement in oral intake. Currently on day #3 of ceftriaxone and fluconazole, on day #2 of doxycycline (being used for cellulitis) - ceftriaxone changed to cefazolin today. Blood cultures previously grew E Coli (pan-sensitive).   Plan: Continue cefazolin 2 g IV every 8 hours Continue doxycycline 100 mg twice daily >> consider stop date of 5 days total Continue fluconazole 400 mg daily for 14 days Monitor clinical picture, renal function F/U C&S, abx deescalation / LOT  Height: 5\' 4"  (162.6 cm) Weight: (!) 341 lb 12.8 oz (155 kg) IBW/kg (Calculated) : 54.7  Temp (24hrs), Avg:98.1 F (36.7 C), Min:97.6 F (36.4 C), Max:98.4 F (36.9 C)  Recent Labs  Lab 11/24/17 1431 11/24/17 1445 11/24/17 2017 11/24/17 2024 11/25/17 0106 11/25/17 0959 11/25/17 1147 11/26/17 0637 11/26/17 1533  WBC 21.9*  --   --   --  26.6* 19.6*  --  10.6* 11.7*  CREATININE 1.20*  --   --   --  0.95  --   --  0.82  --   LATICACIDVEN  --  3.97* 3.1* 3.02*  --  1.4 1.1  --   --     Estimated Creatinine Clearance: 109.2 mL/min (by C-G formula based on SCr of 0.82 mg/dL).    Allergies  Allergen Reactions  . Other Anaphylaxis    NUTS  . Penicillins Other (See Comments)    Headache. Has patient had a PCN reaction causing immediate rash, facial/tongue/throat swelling, SOB or lightheadedness with hypotension: No Has patient had a PCN reaction causing severe rash involving mucus membranes or skin necrosis: No Has patient had a PCN reaction that required hospitalization:  No Has patient had a PCN reaction occurring within the last 10 years: Yes If all of the above answers are "NO", then may proceed with Cephalosporin use.   [name] Amoxicillin-Pot Clavulanate Other (See Comments)    headache  . Food Hives    Potato  . Tomato Hives    Thank you for allowing pharmacy to be a part of this patient's care.  Doylene Canard, PharmD Clinical Pharmacist  Pager: [phone] Phone: [phone] 11/27/2017 1:12 PM

## 2017-11-27 NOTE — Progress Notes (Signed)
PROGRESS NOTE    [name]  GYI:[id number] DOB: 1955/11/29 DOA: 11/24/2017 PCP: Debbrah Alar, NP    Brief Narrative: [name]  is a 62 y.o. female, [name] , HTN and morbid obesity presenting acute onset of altered mental status with fever at home.  She has been gradually getting weak however today the patient family noticed extreme confusion with nausea and vomiting and fever.  Patient received IV fluids in the emergency room and improved and her mental status gradually improved as well.  Code sepsis was called and patient received IV antibiotics as well.  She is alert awake and can converse at this time.  She denies any preceding chest pains shortness of breath or urinary symptoms.  In the emergency room she had to be catheterized and the urine analysis was positive for UTI.     Assessment & Plan:   Active Problems:   Hypothyroidism   Atrial fibrillation ([place])   Sepsis ([place])   1-Sepsis; secondary to UTI> right ureter stone.  E coli Bacteremia;  Continue with IV fluids, IV bolus.  Change antibiotics to ceftriaxone. dioscorine vancomycin cefepime.  CT ; 10 x 10 x 8 mm stone in the distal right ureter with periureteric edema/inflammation tracking proximally up to the right kidney. There is associated mild fullness of the right intrarenal collecting system and ureter proximal to the stone. Given the history of fever and mental status changes, infection proximal to the obstruction is a distinct consideration. Bilateral nephrolithiasis. 3. Hepatomegaly with hepatic steatosis. -urology consulted. Patient made NPO. Underwent ureteral stent placement 7-03.  WBC decreased to 11. Blood culture one of two growing E coli, sensitivity pending.  Continue with ceftriaxone.  Repeat Blood culture today.  NSL.   2-UTI, urine retention.  Foley removed.   3-Acute encephalopathy, toxic. Related to infection.  Resolved  4-Thrombocytopenia; suspect related to  infection. DIC panel negative.  Discussed with Dr Burr Medico, she will speak with pathologist for cbc reading   5-A fib; hold eliquis due to severe thrombocytopenia. Awaiting clarification of platelet count to resume eliquis.  On Cardizem, holder parameter for BP.   6-Hypocalcemia. Continue with  supplementation   7-Palmar rash, also thigh back. Cellulitis.  RPR negative. Improved with doxycycline.   DVT prophylaxis: hold eliquis due to thrombocytopenia.  Code Status: full code.  Family Communication: care discussed with patient.  Disposition Plan: home when stable, PT evaluation   Consultants:   Urology    Procedures:  CT renal;    Antimicrobials:  Received vancomyc and cefepime.   Ceftriaxone 7-03   Subjective: Alert, back pain better.  Rash improved.    Objective: Vitals:   11/26/17 2155 11/27/17 0015 11/27/17 0500 11/27/17 0804  BP: (!) 110/56 116/64    Pulse: 67 62    Resp: (!) 22 18    Temp: 97.6 F (36.4 C) 98.3 F (36.8 C)    TempSrc: Oral     SpO2: 93% 92%  96%  Weight:   (!) 155 kg (341 lb 12.8 oz)   Height:        Intake/Output Summary (Last 24 hours) at 11/27/2017 0810 Last data filed at 11/27/2017 0751 Gross per 24 hour  Intake 4019.81 ml  Output 2650 ml  Net 1369.81 ml   Filed Weights   11/25/17 2040 11/26/17 0552 11/27/17 0500  Weight: (!) 152.9 kg (337 lb 1.3 oz) (!) 153.2 kg (337 lb 11.9 oz) (!) 155 kg (341 lb 12.8 oz)  Examination:  General exam: NAD Respiratory system: CTA Cardiovascular system: S 1, S 2 RRR Gastrointestinal system: BS present, soft, nt Central nervous system: Non focal.  Extremities: Symmetric power Skin: palmar rash      Data Reviewed: I have personally reviewed following labs and imaging studies  CBC: Recent Labs  Lab 11/24/17 1431 11/25/17 0106 11/25/17 0959 11/26/17 0637 11/26/17 0837 11/26/17 1533  WBC 21.9* 26.6* 19.6* 10.6*  --  11.7*  NEUTROABS 21.1*  --   --   --   --   --   HGB 13.9 12.4  12.5 12.5  --  12.3  HCT 44.0 39.5 39.9 40.3  --  39.6  MCV 90.5 90.6 91.1 91.2  --  91.0  PLT PLATELET CLUMPS NOTED ON SMEAR, COUNT APPEARS DECREASED 38* PLATELET CLUMPS NOTED ON SMEAR, UNABLE TO ESTIMATE PLATELET CLUMPS NOTED ON SMEAR, UNABLE TO ESTIMATE PLATELET CLUMPS NOTED ON SMEAR, UNABLE TO ESTIMATE PLATELET CLUMPS NOTED ON SMEAR, COUNT APPEARS DECREASED   Basic Metabolic Panel: Recent Labs  Lab 11/24/17 1431 11/25/17 0106 11/26/17 0637  NA 140 141 142  K 3.3* 3.5 3.9  CL 100 108 110  CO2 23 24 23   GLUCOSE 104* 112* 142*  BUN 23* 18 11  CREATININE 1.20* 0.95 0.82  CALCIUM 7.5* 6.5* 6.9*   GFR: Estimated Creatinine Clearance: 109.2 mL/min (by C-G formula based on SCr of 0.82 mg/dL). Liver Function Tests: Recent Labs  Lab 11/24/17 1431  AST 27  ALT 37  ALKPHOS 102  BILITOT 1.1  PROT 6.0*  ALBUMIN 3.1*   No results for input(s): LIPASE, AMYLASE in the last 168 hours. No results for input(s): AMMONIA in the last 168 hours. Coagulation Profile: Recent Labs  Lab 11/26/17 0837  INR 1.35   Cardiac Enzymes: No results for input(s): CKTOTAL, CKMB, CKMBINDEX, TROPONINI in the last 168 hours. BNP (last 3 results) Recent Labs    03/20/17 1420 08/28/17 0930  PROBNP 25.0 17.0   HbA1C: No results for input(s): HGBA1C in the last 72 hours. CBG: No results for input(s): GLUCAP in the last 168 hours. Lipid Profile: No results for input(s): CHOL, HDL, LDLCALC, TRIG, CHOLHDL, LDLDIRECT in the last 72 hours. Thyroid Function Tests: Recent Labs    11/25/17 0106  TSH 0.631   Anemia Panel: No results for input(s): VITAMINB12, FOLATE, FERRITIN, TIBC, IRON, RETICCTPCT in the last 72 hours. Sepsis Labs: Recent Labs  Lab 11/24/17 2017 11/24/17 2024 11/25/17 0959 11/25/17 1147  LATICACIDVEN 3.1* 3.02* 1.4 1.1    Recent Results (from the past 240 hour(s))  Blood Culture (routine x 2)     Status: None (Preliminary result)   Collection Time: 11/24/17  1:25 PM    Result Value Ref Range Status   Specimen Description BLOOD LEFT FOREARM  Final   Special Requests   Final    BOTTLES DRAWN AEROBIC AND ANAEROBIC Blood Culture adequate volume   Culture   Final    NO GROWTH 2 DAYS Performed at [facility], [place] [address] [place], [place] [address]    Report Status PENDING  Incomplete  Blood Culture (routine x 2)     Status: Abnormal (Preliminary result)   Collection Time: 11/24/17  2:58 PM  Result Value Ref Range Status   Specimen Description BLOOD LEFT HAND  Final   Special Requests   Final    BOTTLES DRAWN AEROBIC AND ANAEROBIC Blood Culture adequate volume   Culture  Setup Time   Final    ANAEROBIC  BOTTLE ONLY GRAM NEGATIVE RODS CRITICAL RESULT CALLED TO, READ BACK BY AND VERIFIED WITH: Asencion Islam PharmD 10:30 11/25/17 ([email or web address])    Culture (A)  Final    ESCHERICHIA COLI SUSCEPTIBILITIES TO FOLLOW Performed at [facility], [place] [address] [place], [place] [address]    Report Status PENDING  Incomplete  Blood Culture ID Panel (Reflexed)     Status: Abnormal   Collection Time: 11/24/17  2:58 PM  Result Value Ref Range Status   Enterococcus species NOT DETECTED NOT DETECTED Final   Listeria monocytogenes NOT DETECTED NOT DETECTED Final   Staphylococcus species NOT DETECTED NOT DETECTED Final   Staphylococcus aureus NOT DETECTED NOT DETECTED Final   Streptococcus species NOT DETECTED NOT DETECTED Final   Streptococcus agalactiae NOT DETECTED NOT DETECTED Final   Streptococcus pneumoniae NOT DETECTED NOT DETECTED Final   Streptococcus pyogenes NOT DETECTED NOT DETECTED Final   Acinetobacter baumannii NOT DETECTED NOT DETECTED Final   Enterobacteriaceae species DETECTED (A) NOT DETECTED Final    Comment: Enterobacteriaceae represent a large family of gram-negative bacteria, not a single organism. CRITICAL RESULT CALLED TO, READ BACK BY AND VERIFIED WITH: Jacinto Reap Mancheril PharmD 10:30 11/25/17 ([email or web address])    Enterobacter cloacae complex  NOT DETECTED NOT DETECTED Final   Escherichia coli DETECTED (A) NOT DETECTED Final    Comment: CRITICAL RESULT CALLED TO, READ BACK BY AND VERIFIED WITH: Asencion Islam PharmD 10:30 11/25/17 ([email or web address])    Klebsiella oxytoca NOT DETECTED NOT DETECTED Final   Klebsiella pneumoniae NOT DETECTED NOT DETECTED Final   Proteus species NOT DETECTED NOT DETECTED Final   Serratia marcescens NOT DETECTED NOT DETECTED Final   Carbapenem resistance NOT DETECTED NOT DETECTED Final   Haemophilus influenzae NOT DETECTED NOT DETECTED Final   Neisseria meningitidis NOT DETECTED NOT DETECTED Final   Pseudomonas aeruginosa NOT DETECTED NOT DETECTED Final   Candida albicans NOT DETECTED NOT DETECTED Final   Candida glabrata NOT DETECTED NOT DETECTED Final   Candida krusei NOT DETECTED NOT DETECTED Final   Candida parapsilosis NOT DETECTED NOT DETECTED Final   Candida tropicalis NOT DETECTED NOT DETECTED Final    Comment: Performed at [facility], [place] [address], [place], [place] [address]  Urine culture     Status: Abnormal   Collection Time: 11/24/17  7:45 PM  Result Value Ref Range Status   Specimen Description URINE, CLEAN CATCH  Final   Special Requests   Final    NONE Performed at [facility], [place] [address] [place], [place] [address]    Culture MULTIPLE SPECIES PRESENT, SUGGEST RECOLLECTION (A)  Final   Report Status 11/26/2017 FINAL  Final  MRSA PCR Screening     Status: Abnormal   Collection Time: 11/24/17 11:57 PM  Result Value Ref Range Status   MRSA by PCR POSITIVE (A) NEGATIVE Final    Comment:        The GeneXpert MRSA Assay (FDA approved for NASAL specimens only), is one component of a comprehensive MRSA colonization surveillance program. It is not intended to diagnose MRSA infection nor to guide or monitor treatment for MRSA infections. RESULT CALLED TO, READ BACK BY AND VERIFIED WITH: Dorise Bullion RN 11/25/17 0324 JDW          Radiology Studies: Dg C-arm 1-60  Min-no Report  Result Date: 11/25/2017 Fluoroscopy was utilized by the requesting physician.  No radiographic interpretation.   Ct Renal Stone Study  Result Date: 11/25/2017 CLINICAL DATA:  Altered mental status  and fever. EXAM: CT ABDOMEN AND PELVIS WITHOUT CONTRAST TECHNIQUE: Multidetector CT imaging of the abdomen and pelvis was performed following the standard protocol without IV contrast. COMPARISON:  12/10/2016 FINDINGS: Lower chest: Dependent atelectasis noted in the lower lobes bilaterally. Hepatobiliary: The liver shows diffusely decreased attenuation suggesting steatosis. Liver measures 23.9 cm craniocaudal length, enlarged. Gallbladder is distended. No intrahepatic or extrahepatic biliary dilation. Pancreas: No focal mass lesion. No dilatation of the main duct. No intraparenchymal cyst. No peripancreatic edema. Spleen: No splenomegaly. No focal mass lesion. Adrenals/Urinary Tract: No adrenal nodule or mass. Multiple stones are seen in the kidneys bilaterally. Dominant stone in the left kidney fills a lower pole collecting system and measures 1.7 x 0.9 x 2.4 cm. Left ureter is unremarkable. Multiple stones are noted in the right kidney including a posterior interpolar stone measuring 11 x 7 x 16 mm. There is mild fullness of the right intrarenal collecting system with right perinephric edema/inflammation. Several tiny stones are seen in the right renal pelvis. Edema/inflammation is seen around the mildly distended right ureter. 10 x 10 x 8 mm distal right ureteral stone is identified on axial imaging (series 3: Image 73). Foley catheter decompresses the urinary bladder. Stomach/Bowel: Stomach is nondistended. No gastric wall thickening. No evidence of outlet obstruction. Duodenum is normally positioned as is the ligament of Treitz. No small bowel wall thickening. No small bowel dilatation. The terminal ileum is normal. The appendix is not visualized, but there is no edema or inflammation in the region  of the cecum. No gross colonic mass. No colonic wall thickening. No substantial diverticular change. Vascular/Lymphatic: There is abdominal aortic atherosclerosis without aneurysm. There is no gastrohepatic or hepatoduodenal ligament lymphadenopathy. No intraperitoneal or retroperitoneal lymphadenopathy. No pelvic sidewall lymphadenopathy. Reproductive: IUD is visualized in the uterus. There is no adnexal mass. Other: No intraperitoneal free fluid. Musculoskeletal: No worrisome lytic or sclerotic osseous abnormality. IMPRESSION: 1. 10 x 10 x 8 mm stone in the distal right ureter with periureteric edema/inflammation tracking proximally up to the right kidney. There is associated mild fullness of the right intrarenal collecting system and ureter proximal to the stone. Given the history of fever and mental status changes, infection proximal to the obstruction is a distinct consideration. 2. Bilateral nephrolithiasis. 3. Hepatomegaly with hepatic steatosis. 4.  Aortic Atherosclerois (ICD10-170.0) Electronically Signed   By: Misty Stanley M.D.   On: 11/25/2017 10:43        Scheduled Meds: . calcitRIOL  2 mcg Oral Daily  . calcium citrate  200 mg of elemental calcium Oral BID  . Chlorhexidine Gluconate Cloth  6 each Topical Q0600  . diltiazem  240 mg Oral Daily  . doxycycline  100 mg Oral Q12H  . fluconazole  400 mg Oral Daily  . levothyroxine  300 mcg Oral QAC breakfast  . loratadine  10 mg Oral QPM  . mometasone-formoterol  2 puff Inhalation BID  . mupirocin ointment  1 application Nasal BID  . nystatin   Topical TID  . pantoprazole  40 mg Oral Daily  . saccharomyces boulardii  250 mg Oral BID   Continuous Infusions: . sodium chloride 50 mL/hr at 11/27/17 0653  . sodium chloride 10 mL/hr at 11/25/17 1900  . cefTRIAXone (ROCEPHIN)  IV Stopped (11/26/17 0946)  . lactated ringers Stopped (11/26/17 0838)     LOS: 3 days    Time spent: 35 minutes.     Elmarie Shiley, MD [facility] Pager [phone]  If 7PM-7AM, please contact  night-coverage [email or web address] Password [email or web address] 11/27/2017, 8:10 AM

## 2017-11-28 DIAGNOSIS — A4151 Sepsis due to Escherichia coli [E. coli]: Secondary | ICD-10-CM

## 2017-11-28 DIAGNOSIS — R7881 Bacteremia: Secondary | ICD-10-CM

## 2017-11-28 DIAGNOSIS — E890 Postprocedural hypothyroidism: Secondary | ICD-10-CM

## 2017-11-28 LAB — BASIC METABOLIC PANEL
Anion gap: 9 (ref 5–15)
BUN: 16 mg/dL (ref 6–20)
CALCIUM: 7.8 mg/dL — AB (ref 8.9–10.3)
CO2: 25 mmol/L (ref 22–32)
Chloride: 108 mmol/L (ref 98–111)
Creatinine, Ser: 0.84 mg/dL (ref 0.44–1.00)
GFR calc Af Amer: >60 mL/min (ref >60)
Glucose, Bld: 89 mg/dL (ref 70–99)
Potassium: 4.1 mmol/L (ref 3.5–5.1)
Sodium: 142 mmol/L (ref 135–145)

## 2017-11-28 LAB — CBC
HCT: 41.4 % (ref 36.0–46.0)
Hemoglobin: 12.7 g/dL (ref 12.0–15.0)
MCH: 28.2 pg (ref 26.0–34.0)
MCHC: 30.7 g/dL (ref 30.0–36.0)
MCV: 91.8 fL (ref 78.0–100.0)
PLATELETS: ADEQUATE 10*3/uL (ref 150–400)
RBC: 4.51 MIL/uL (ref 3.87–5.11)
RDW: 15.2 % (ref 11.5–15.5)
WBC: 8.9 10*3/uL (ref 4.0–10.5)

## 2017-11-28 MED ORDER — FUROSEMIDE 10 MG/ML IJ SOLN
40.0000 mg | Freq: Once | INTRAMUSCULAR | Status: AC
  Administered 2017-11-28: 40 mg via INTRAVENOUS
  Filled 2017-11-28: qty 4

## 2017-11-28 MED ORDER — CIPROFLOXACIN HCL 500 MG PO TABS: 500.0000 mg | ORAL_TABLET | Freq: Two times a day (BID) | ORAL | 0 refills | Status: DC

## 2017-11-28 MED ORDER — SACCHAROMYCES BOULARDII 250 MG PO CAPS: 250.0000 mg | ORAL_CAPSULE | Freq: Two times a day (BID) | ORAL | 0 refills | Status: DC

## 2017-11-28 MED ORDER — NYSTATIN 100000 UNIT/GM EX POWD: Freq: Three times a day (TID) | CUTANEOUS | 0 refills | Status: DC

## 2017-11-28 MED ORDER — FLUCONAZOLE 200 MG PO TABS: 400.0000 mg | ORAL_TABLET | Freq: Every day | ORAL | 0 refills | Status: DC

## 2017-11-28 MED ORDER — FUROSEMIDE 40 MG PO TABS: 40.0000 mg | ORAL_TABLET | Freq: Every day | ORAL | Status: DC

## 2017-11-28 MED ORDER — APIXABAN 5 MG PO TABS
5.0000 mg | ORAL_TABLET | Freq: Two times a day (BID) | ORAL | Status: DC
  Administered 2017-11-28 – 2017-11-29 (×3): 5 mg via ORAL
  Filled 2017-11-28 (×3): qty 1

## 2017-11-28 MED ORDER — CIPROFLOXACIN HCL 500 MG PO TABS
500.0000 mg | ORAL_TABLET | Freq: Two times a day (BID) | ORAL | Status: DC
  Administered 2017-11-28 – 2017-11-29 (×3): 500 mg via ORAL
  Filled 2017-11-28 (×3): qty 1

## 2017-11-28 MED ORDER — FUROSEMIDE 40 MG PO TABS
40.0000 mg | ORAL_TABLET | Freq: Every evening | ORAL | Status: DC
  Administered 2017-11-28: 40 mg via ORAL
  Filled 2017-11-28: qty 1

## 2017-11-28 MED ORDER — FUROSEMIDE 40 MG PO TABS: 40.0000 mg | ORAL_TABLET | ORAL | Status: DC

## 2017-11-28 MED ORDER — FUROSEMIDE 80 MG PO TABS
80.0000 mg | ORAL_TABLET | Freq: Every morning | ORAL | Status: DC
  Administered 2017-11-29: 80 mg via ORAL
  Filled 2017-11-28: qty 1

## 2017-11-28 NOTE — Care Management Note (Signed)
Case Management Note  Patient Details  Name: [name] MRN: [id number] Date of Birth: 11-13-1955  Subjective/Objective:     Pt presented with UTI and AMS.  Pt states she has used [facility] in the past and would like to use them again for [facility] PT and RN.                 Action/Plan: Tommi Rumps with [facility] accepted referral for [facility] services.   Expected Discharge Date:  11/28/17               Expected Discharge Plan:  [place]  In-House Referral:  NA  Discharge planning Services  CM Consult  Post Acute Care Choice:  Home Health Choice offered to:  Patient  DME Arranged:    DME Agency:     HH Arranged:  PT, RN [place] Agency:  [place]  Status of Service:  Completed, signed off  If discussed at [place] of Stay Meetings, dates discussed:    Additional Comments:  Claudie Leach, RN 11/28/2017, 1:55 PM

## 2017-11-28 NOTE — Evaluation (Signed)
Physical Therapy Evaluation Patient Details Name: [name] MRN: [id number] DOB: 14-Nov-1955 Today's Date: 11/28/2017   History of Present Illness  Pt is a 62 y.o. F with significant PMH of Afib on Xarelto, HTN, and morbid obesity presenting with acute onset of altered mental status at home. Admitted with UTI.  Clinical Impression  Pt admitted with above diagnosis. Pt currently with functional limitations due to the deficits listed below (see PT Problem List). 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 Uses a Rollator for mobility. Currently presenting with decreased functional mobility secondary to diminished endurance and generalized weakness. Ambulating in room with walker and supervision. Instructed patient on exercise recommendations and activity pacing. Recommending HHPT to address deficits and maximize functional independence. Pt will benefit from skilled PT to increase their independence and safety with mobility to allow discharge to the venue listed below.       Follow Up Recommendations Home health PT    Equipment Recommendations  None recommended by PT    Recommendations for Other Services       Precautions / Restrictions Precautions Precautions: Fall Restrictions Weight Bearing Restrictions: No      Mobility  Bed Mobility               General bed mobility comments: OOB in recliner  Transfers Overall transfer level: Modified independent Equipment used: Rolling walker (2 wheeled)                Ambulation/Gait Ambulation/Gait assistance: Supervision Gait Distance (Feet): 30 Feet Assistive device: Rolling walker (2 wheeled) Gait Pattern/deviations: Step-through pattern;Trunk flexed;Wide base of support     General Gait Details: patient with poor proximity to walker with trunk flexed. demonstrated appropriate proximity to promote upright posture.   Stairs            Wheelchair  Mobility    Modified Rankin (Stroke Patients Only)       Balance Overall balance assessment: Mild deficits observed, not formally tested                                           Pertinent Vitals/Pain Pain Assessment: No/denies pain    Home Living Family/patient expects to be discharged to:: Private residence Living Arrangements: Alone Available Help at Discharge: Family;Available PRN/intermittently Type of Home: House Home Access: Ramped entrance     Home Layout: Two level;Able to live on main level with bedroom/bathroom Home Equipment: Shower seat;Walker - 4 wheels;Walker - 2 wheels      Prior Function Level of Independence: Independent with assistive device(s)         Comments: works Psychiatric nurse. Uses Rollator in the community and uses a standard walker for increased support in the shower     Hand Dominance        Extremity/Trunk Assessment   Upper Extremity Assessment Upper Extremity Assessment: Generalized weakness    Lower Extremity Assessment Lower Extremity Assessment: Generalized weakness       Communication   Communication: No difficulties  Cognition Arousal/Alertness: Awake/alert Behavior During Therapy: WFL for tasks assessed/performed Overall Cognitive Status: Within Functional Limits for tasks assessed  General Comments      Exercises     Assessment/Plan    PT Assessment Patient needs continued PT services  PT Problem List Decreased strength;Decreased activity tolerance;Decreased balance;Decreased mobility;Obesity       PT Treatment Interventions DME instruction;Gait training;Functional mobility training;Therapeutic activities;Therapeutic exercise;Balance training;Patient/family education    PT Goals (Current goals can be found in the Care Plan section)  Acute Rehab PT Goals Patient Stated Goal: get stronger before foot surgery PT Goal  Formulation: With patient Time For Goal Achievement: 12/12/17 Potential to Achieve Goals: Good    Frequency Min 3X/week   Barriers to discharge        Co-evaluation               AM-PAC PT "6 Clicks" Daily Activity  Outcome Measure Difficulty turning over in bed (including adjusting bedclothes, sheets and blankets)?: A Little Difficulty moving from lying on back to sitting on the side of the bed? : A Little Difficulty sitting down on and standing up from a chair with arms (e.g., wheelchair, bedside commode, etc,.)?: A Little Help needed moving to and from a bed to chair (including a wheelchair)?: A Little Help needed walking in hospital room?: A Little Help needed climbing 3-5 steps with a railing? : A Lot 6 Click Score: 17    End of Session Equipment Utilized During Treatment: Gait belt Activity Tolerance: Patient tolerated treatment well Patient left: in chair;with call bell/phone within reach Nurse Communication: Mobility status PT Visit Diagnosis: Muscle weakness (generalized) (M62.81);Unsteadiness on feet (R26.81)    Time: 6803-2122 PT Time Calculation (min) (ACUTE ONLY): 22 min   Charges:   PT Evaluation $PT Eval Low Complexity: 1 Low     PT G Codes:        Ellamae Sia, PT, DPT Acute Rehabilitation Services  Pager: [phone]  Willy Eddy 11/28/2017, 12:47 PM

## 2017-11-28 NOTE — Discharge Summary (Addendum)
Physician Discharge Summary  [name] AQT:[id number] DOB: 22-Nov-1955 DOA: 11/24/2017  PCP: Debbrah Alar, NP  Admit date: 11/24/2017 Discharge date: 11/28/2017  Admitted From: Home  Disposition: Home   Recommendations for Outpatient Follow-up:  1. Follow up with PCP in 1-2 weeks 2. Please obtain BMP/CBC in one week 3. Please follow up on the following pending results: follow final blood culture results.  4. Needs to follow up with urology for stent and stone treatment.  5. Needs to follow up with Dr Marin Olp to follow platelet count.     Discharge Condition: Stable.  CODE STATUS: full code.  Diet recommendation: Heart Healthy  Brief/Interim Summary:  Brief Narrative: [name],[name] , [name] Patient received IV fluids in the emergency room and improved and her mental status gradually improved as well. Code sepsis was called and patient received IV antibiotics as well. She is alert awake and can converse at this time. She denies any preceding chest pains shortness of breath or urinary symptoms. In the emergency room she had to be catheterized and the urine analysis was positive for UTI.     Assessment & Plan:   Active Problems:   Hypothyroidism   Atrial fibrillation ([place])   Sepsis ([place])   1-Sepsis; secondary to UTI> right ureter stone.  E coli Bacteremia;  Continue with IV fluids, IV bolus.  Change antibiotics to ceftriaxone. dioscorine vancomycin cefepime.  CT ; 10 x 10 x 8 mm stone in the distal right ureter with periureteric edema/inflammation tracking proximally up to the right kidney. There is associated mild fullness of the right intrarenal collecting system and ureter proximal to the stone.  Given the history of fever and mental status changes, infection proximal to the obstruction is a distinct consideration. Bilateral nephrolithiasis. 3. Hepatomegaly with hepatic steatosis. -urology consulted. Patient made NPO. Underwent ureteral stent placement 7-03.  WBC decreased to 11. Blood culture one of two grew  E coli, sensitivity to ciprofloxacin.   Repeat Blood culture 7-05 no growth to date.  NSL.   2-UTI, urine retention.  Foley removed.  resolved.   3-Acute encephalopathy, toxic. Related to infection.  Resolved  4-Thrombocytopenia; suspect related to infection. DIC panel negative.  Repeated cbc today , count appears improved, normal  5-A fib; hold eliquis due to severe thrombocytopenia. On Cardizem, holder parameter for BP.  discussed with Dr Burr Medico, per pathologist platelet count of lab 7-05 appears to be at 50 k. Ok to resume eliquis, close follow up with Dr Marin Olp.   6-Hypocalcemia. Continue with  supplementation   7-Palmar rash, also thigh back. Cellulitis.  RPR negative. Improved with doxycycline. treated with 3 days of doxy and one day of vancomycin.   8-LE Edema; after fluid resuscitation. Resume home dose lasix. Will give 40 mg Iv today prior to discharge   Oral thrush; treated with fluconazole.    Discharge Diagnoses:  Active Problems:   Hypothyroidism   Atrial fibrillation ([place])   Right ureteral stone   Sepsis ([place])   Sepsis due to Escherichia coli (E. coli) ([place])   E coli bacteremia    Discharge Instructions  Discharge Instructions    Diet - low sodium heart healthy   Complete by:  As directed    Increase activity slowly   Complete by:  As directed      Allergies as of 11/28/2017      Reactions   Other Anaphylaxis   NUTS   Penicillins Other (See Comments)   Headache. Has patient had a PCN reaction causing immediate rash, facial/tongue/throat swelling, SOB or lightheadedness with hypotension: No Has patient had a PCN reaction causing  severe rash involving mucus membranes or skin necrosis: No Has patient had a PCN reaction that required hospitalization: No Has patient had a PCN reaction occurring within the last 10 years: Yes If all of the above answers are "NO", then may proceed with Cephalosporin use.   Amoxicillin-pot Clavulanate Other (See Comments)   headache   Food Hives   Potato   Tomato Hives      Medication List    STOP taking these medications   losartan 50 MG tablet Commonly known as:  COZAAR   nystatin cream Commonly known as:  MYCOSTATIN Replaced by:  nystatin powder     TAKE these medications   acetaminophen 325 MG tablet Commonly known as:  TYLENOL Take 2 tablets (650 mg total) by mouth every 6 (six) hours as needed for mild pain or moderate pain.   albuterol 108 (90 Base) MCG/ACT inhaler Commonly known as:  PROVENTIL HFA;VENTOLIN HFA Inhale 2 puffs into the lungs every 6 (six) hours as needed for wheezing or shortness of breath.   apixaban 5 MG Tabs tablet Commonly known as:  ELIQUIS Take 5 mg by mouth 2 (two) times daily.   calcitRIOL 0.5 MCG capsule Commonly known as:  ROCALTROL Take 2 mcg by mouth daily.   calcium carbonate 500 MG chewable tablet Commonly known as:  TUMS - dosed in mg elemental calcium Chew 2 tablets (400 mg of elemental calcium total) by mouth 4 (four) times daily.   ciprofloxacin 500 MG tablet Commonly known as:  CIPRO Take 1 tablet (500 mg total) by mouth 2 (two) times daily.   citric acid-potassium citrate 1100-334 MG/5ML solution Commonly known as:  POLYCITRA Take 10 mEq by mouth daily.   desoximetasone 0.05 % cream Commonly known as:  TOPICORT Apply topically 2 (two) times daily as needed.   diltiazem 240 MG 24 hr capsule Commonly known as:  CARDIZEM CD TAKE 1 CAPSULE BY MOUTH  DAILY   diphenhydrAMINE 25 mg capsule Commonly known as:  BENADRYL Take 25 mg by mouth every 8 (eight) hours as needed for itching.   fluticasone 50 MCG/ACT nasal  spray Commonly known as:  FLONASE Place 2 sprays into both nostrils daily as needed for allergies or rhinitis.   furosemide 40 MG tablet Commonly known as:  LASIX 2 tabs by mouth daily in the AM and 1 tablet in the PM What changed:    how much to take  how to take this  when to take this  additional instructions   levocetirizine 5 MG tablet Commonly known as:  XYZAL TAKE 1 TABLET BY MOUTH  EVERY EVENING   levothyroxine 100 MCG tablet Commonly known as:  SYNTHROID, LEVOTHROID Take 300 mcg by mouth daily.   neomycin-bacitracin-polymyxin ointment Commonly known as:  NEOSPORIN Apply 1 application topically as needed for wound care. apply to eye   nystatin powder Commonly known as:  MYCOSTATIN/NYSTOP Apply topically 3 (three) times daily. Replaces:  nystatin cream   omeprazole 40 MG capsule Commonly known as:  PRILOSEC TAKE 1 CAPSULE BY MOUTH  DAILY   saccharomyces boulardii 250 MG capsule Commonly known as:  FLORASTOR Take 1 capsule (250 mg total) by  mouth 2 (two) times daily.   SYMBICORT 160-4.5 MCG/ACT inhaler Generic drug:  budesonide-formoterol INHALE 2 PUFFS TWO TIMES  DAILY   traMADol 50 MG tablet Commonly known as:  ULTRAM Take 1 tablet (50 mg total) by mouth every 12 (twelve) hours as needed.   VITAMIN B-12 IJ Inject as directed every 30 (thirty) days.   Vitamin D (Ergocalciferol) 50000 units Caps capsule Commonly known as:  DRISDOL TAKE 1 CAPSULE BY MOUTH 3  TIMES WEEKLY   zafirlukast 20 MG tablet Commonly known as:  ACCOLATE TAKE 1 TABLET BY MOUTH TWO  TIMES DAILY BEFORE MEALS      Follow-up Information    [facility] Follow up.   Contact information: [place] .   Specialty:  Urology Contact information: [address] [place]       Volanda Napoleon, MD Follow up in 3 day(s).    Specialty:  Oncology Contact information: [place] [address] [phone]          Allergies  Allergen Reactions  . Other Anaphylaxis    NUTS  . Penicillins Other (See Comments)    Headache. Has patient had a PCN reaction causing immediate rash, facial/tongue/throat swelling, SOB or lightheadedness with hypotension: No Has patient had a PCN reaction causing severe rash involving mucus membranes or skin necrosis: No Has patient had a PCN reaction that required hospitalization: No Has patient had a PCN reaction occurring within the last 10 years: Yes If all of the above answers are "NO", then may proceed with Cephalosporin use.   [name] Amoxicillin-Pot Clavulanate Other (See Comments)    headache  . Food Hives    Potato  . Tomato Hives    Consultations:  Urology  Hematology, phone consult   Procedures/Studies: Dg Chest Port 1 View  Result Date: 11/24/2017 CLINICAL DATA:  Cough and fever EXAM: PORTABLE CHEST 1 VIEW COMPARISON:  08/28/2017 chest radiograph. FINDINGS: In Stable cardiomediastinal silhouette with normal heart size. No pneumothorax. No pleural effusion. Lungs appear clear, with no acute consolidative airspace disease and no pulmonary edema. IMPRESSION: No active disease. Electronically Signed   By: Ilona Sorrel M.D.   On: 11/24/2017 13:42   Dg C-arm 1-60 Min-no Report  Result Date: 11/25/2017 Fluoroscopy was utilized by the requesting physician.  No radiographic interpretation.   Ct Renal Stone Study  Result Date: 11/25/2017 CLINICAL DATA:  Altered mental status and fever. EXAM: CT ABDOMEN AND PELVIS WITHOUT CONTRAST TECHNIQUE: Multidetector CT imaging of the abdomen and pelvis was performed following the standard protocol without IV contrast. COMPARISON:  12/10/2016 FINDINGS: Lower chest: Dependent atelectasis noted in the lower lobes bilaterally. Hepatobiliary: The liver shows diffusely decreased attenuation suggesting steatosis.  Liver measures 23.9 cm craniocaudal length, enlarged. Gallbladder is distended. No intrahepatic or extrahepatic biliary dilation. Pancreas: No focal mass lesion. No dilatation of the main duct. No intraparenchymal cyst. No peripancreatic edema. Spleen: No splenomegaly. No focal mass lesion. Adrenals/Urinary Tract: No adrenal nodule or mass. Multiple stones are seen in the kidneys bilaterally. Dominant stone in the left kidney fills a lower pole collecting system and measures 1.7 x 0.9 x 2.4 cm. Left ureter is unremarkable. Multiple stones are noted in the right kidney including a posterior interpolar stone measuring 11 x 7 x 16 mm. There is mild fullness of the right intrarenal  collecting system with right perinephric edema/inflammation. Several tiny stones are seen in the right renal pelvis. Edema/inflammation is seen around the mildly distended right ureter. 10 x 10 x 8 mm distal right ureteral stone is identified on axial imaging (series 3: Image 73). Foley catheter decompresses the urinary bladder. Stomach/Bowel: Stomach is nondistended. No gastric wall thickening. No evidence of outlet obstruction. Duodenum is normally positioned as is the ligament of Treitz. No small bowel wall thickening. No small bowel dilatation. The terminal ileum is normal. The appendix is not visualized, but there is no edema or inflammation in the region of the cecum. No gross colonic mass. No colonic wall thickening. No substantial diverticular change. Vascular/Lymphatic: There is abdominal aortic atherosclerosis without aneurysm. There is no gastrohepatic or hepatoduodenal ligament lymphadenopathy. No intraperitoneal or retroperitoneal lymphadenopathy. No pelvic sidewall lymphadenopathy. Reproductive: IUD is visualized in the uterus. There is no adnexal mass. Other: No intraperitoneal free fluid. Musculoskeletal: No worrisome lytic or sclerotic osseous abnormality. IMPRESSION: 1. 10 x 10 x 8 mm stone in the distal right ureter with  periureteric edema/inflammation tracking proximally up to the right kidney. There is associated mild fullness of the right intrarenal collecting system and ureter proximal to the stone. Given the history of fever and mental status changes, infection proximal to the obstruction is a distinct consideration. 2. Bilateral nephrolithiasis. 3. Hepatomegaly with hepatic steatosis. 4.  Aortic Atherosclerois (ICD10-170.0) Electronically Signed   By: Misty Stanley M.D.   On: 11/25/2017 10:43    Subjective: Feeling better. LE edema.    Discharge Exam: Vitals:   11/28/17 0807 11/28/17 0959  BP:  (!) 147/76  Pulse:    Resp:    Temp:    SpO2: 96%    Vitals:   11/28/17 0624 11/28/17 0739 11/28/17 0807 11/28/17 0959  BP: 127/69 (!) 146/75  (!) 147/76  Pulse: 77 77    Resp:      Temp:  98.3 F (36.8 C)    TempSrc:  Oral    SpO2:  97% 96%   Weight:      Height:        General: Pt is alert, awake, not in acute distress Cardiovascular: RRR, S1/S2 +, no rubs, no gallops Respiratory: CTA bilaterally, no wheezing, no rhonchi Abdominal: Soft, NT, ND, bowel sounds + Extremities: no edema, no cyanosis    The results of significant diagnostics from this hospitalization (including imaging, microbiology, ancillary and laboratory) are listed below for reference.     Microbiology: Recent Results (from the past 240 hour(s))  Blood Culture (routine x 2)     Status: None (Preliminary result)   Collection Time: 11/24/17  1:25 PM  Result Value Ref Range Status   Specimen Description BLOOD LEFT FOREARM  Final   Special Requests   Final    BOTTLES DRAWN AEROBIC AND ANAEROBIC Blood Culture adequate volume   Culture   Final    NO GROWTH 4 DAYS Performed at [facility], [address] [place], [place] [address]    Report Status PENDING  Incomplete  Blood Culture (routine x 2)     Status: Abnormal   Collection Time: 11/24/17  2:58 PM  Result Value Ref Range Status   Specimen Description BLOOD  LEFT HAND  Final   Special Requests   Final    BOTTLES DRAWN AEROBIC AND ANAEROBIC Blood Culture adequate volume   Culture  Setup Time   Final    ANAEROBIC BOTTLE ONLY GRAM NEGATIVE RODS CRITICAL RESULT CALLED  TO, READ BACK BY AND VERIFIED WITH: Asencion Islam PharmD 10:30 11/25/17 ([email or web address]) Performed at [facility], [place] [address], [place], [place] [address]    Culture ESCHERICHIA COLI (A)  Final   Report Status 11/27/2017 FINAL  Final   Organism ID, Bacteria ESCHERICHIA COLI  Final      Susceptibility   Escherichia coli - MIC*    AMPICILLIN 4 SENSITIVE Sensitive     CEFAZOLIN <=4 SENSITIVE Sensitive     CEFEPIME <=1 SENSITIVE Sensitive     CEFTAZIDIME <=1 SENSITIVE Sensitive     CEFTRIAXONE <=1 SENSITIVE Sensitive     CIPROFLOXACIN <=0.25 SENSITIVE Sensitive     GENTAMICIN <=1 SENSITIVE Sensitive     IMIPENEM <=0.25 SENSITIVE Sensitive     TRIMETH/SULFA <=20 SENSITIVE Sensitive     AMPICILLIN/SULBACTAM <=2 SENSITIVE Sensitive     PIP/TAZO <=4 SENSITIVE Sensitive     Extended ESBL NEGATIVE Sensitive     * ESCHERICHIA COLI  Blood Culture ID Panel (Reflexed)     Status: Abnormal   Collection Time: 11/24/17  2:58 PM  Result Value Ref Range Status   Enterococcus species NOT DETECTED NOT DETECTED Final   Listeria monocytogenes NOT DETECTED NOT DETECTED Final   Staphylococcus species NOT DETECTED NOT DETECTED Final   Staphylococcus aureus NOT DETECTED NOT DETECTED Final   Streptococcus species NOT DETECTED NOT DETECTED Final   Streptococcus agalactiae NOT DETECTED NOT DETECTED Final   Streptococcus pneumoniae NOT DETECTED NOT DETECTED Final   Streptococcus pyogenes NOT DETECTED NOT DETECTED Final   Acinetobacter baumannii NOT DETECTED NOT DETECTED Final   Enterobacteriaceae species DETECTED (A) NOT DETECTED Final    Comment: Enterobacteriaceae represent a large family of gram-negative bacteria, not a single organism. CRITICAL RESULT CALLED TO, READ BACK BY AND VERIFIED WITH: Jacinto Reap  Mancheril PharmD 10:30 11/25/17 ([email or web address])    Enterobacter cloacae complex NOT DETECTED NOT DETECTED Final   Escherichia coli DETECTED (A) NOT DETECTED Final    Comment: CRITICAL RESULT CALLED TO, READ BACK BY AND VERIFIED WITH: Asencion Islam PharmD 10:30 11/25/17 ([email or web address])    Klebsiella oxytoca NOT DETECTED NOT DETECTED Final   Klebsiella pneumoniae NOT DETECTED NOT DETECTED Final   Proteus species NOT DETECTED NOT DETECTED Final   Serratia marcescens NOT DETECTED NOT DETECTED Final   Carbapenem resistance NOT DETECTED NOT DETECTED Final   Haemophilus influenzae NOT DETECTED NOT DETECTED Final   Neisseria meningitidis NOT DETECTED NOT DETECTED Final   Pseudomonas aeruginosa NOT DETECTED NOT DETECTED Final   Candida albicans NOT DETECTED NOT DETECTED Final   Candida glabrata NOT DETECTED NOT DETECTED Final   Candida krusei NOT DETECTED NOT DETECTED Final   Candida parapsilosis NOT DETECTED NOT DETECTED Final   Candida tropicalis NOT DETECTED NOT DETECTED Final    Comment: Performed at [facility], [place] [address] [place], [place] [address]  Urine culture     Status: Abnormal   Collection Time: 11/24/17  7:45 PM  Result Value Ref Range Status   Specimen Description URINE, CLEAN CATCH  Final   Special Requests   Final    NONE Performed at [facility], [place] [address] [place], [place] [address]    Culture MULTIPLE SPECIES PRESENT, SUGGEST RECOLLECTION (A)  Final   Report Status 11/26/2017 FINAL  Final  MRSA PCR Screening     Status: Abnormal   Collection Time: 11/24/17 11:57 PM  Result Value Ref Range Status   MRSA by PCR POSITIVE (A) NEGATIVE Final  Comment:        The GeneXpert MRSA Assay (FDA approved for NASAL specimens only), is one component of a comprehensive MRSA colonization surveillance program. It is not intended to diagnose MRSA infection nor to guide or monitor treatment for MRSA infections. RESULT CALLED TO, READ BACK BY AND VERIFIED WITH: Dorise Bullion  RN 11/25/17 0324 JDW   Culture, blood (routine x 2)     Status: None (Preliminary result)   Collection Time: 11/27/17  8:11 AM  Result Value Ref Range Status   Specimen Description BLOOD LEFT ANTECUBITAL  Final   Special Requests   Final    BOTTLES DRAWN AEROBIC ONLY Blood Culture results may not be optimal due to an inadequate volume of blood received in culture bottles   Culture   Final    NO GROWTH < 24 HOURS Performed at [facility], [place] [address] [place], [place] [address]    Report Status PENDING  Incomplete  Culture, blood (routine x 2)     Status: None (Preliminary result)   Collection Time: 11/27/17  8:19 AM  Result Value Ref Range Status   Specimen Description BLOOD LEFT ARM  Final   Special Requests   Final    BOTTLES DRAWN AEROBIC ONLY Blood Culture results may not be optimal due to an inadequate volume of blood received in culture bottles   Culture   Final    NO GROWTH < 24 HOURS Performed at [facility], [place] [address], [place], [place] [address]    Report Status PENDING  Incomplete     Labs: BNP (last 3 results) No results for input(s): BNP in the last 8760 hours. Basic Metabolic Panel: Recent Labs  Lab 11/24/17 1431 11/25/17 0106 11/26/17 0637 11/28/17 0934  NA 140 141 142 142  K 3.3* 3.5 3.9 4.1  CL 100 108 110 108  CO2 23 24 23 25   GLUCOSE 104* 112* 142* 89  BUN 23* 18 11 16   CREATININE 1.20* 0.95 0.82 0.84  CALCIUM 7.5* 6.5* 6.9* 7.8*   Liver Function Tests: Recent Labs  Lab 11/24/17 1431  AST 27  ALT 37  ALKPHOS 102  BILITOT 1.1  PROT 6.0*  ALBUMIN 3.1*   No results for input(s): LIPASE, AMYLASE in the last 168 hours. No results for input(s): AMMONIA in the last 168 hours. CBC: Recent Labs  Lab 11/24/17 1431 11/25/17 0106 11/25/17 0959 11/26/17 1829 11/26/17 0837 11/26/17 1533 11/27/17 0951 11/28/17 0934  WBC 21.9* 26.6* 19.6* 10.6*  --  11.7*  --  8.9  NEUTROABS 21.1*  --   --   --   --   --   --   --   HGB 13.9  12.4 12.5 12.5  --  12.3  --  12.7  HCT 44.0 39.5 39.9 40.3  --  39.6  --  41.4  MCV 90.5 90.6 91.1 91.2  --  91.0  --  91.8  PLT PLATELET CLUMPS NOTED ON SMEAR, COUNT APPEARS DECREASED 38* PLATELET CLUMPS NOTED ON SMEAR, UNABLE TO ESTIMATE PLATELET CLUMPS NOTED ON SMEAR, UNABLE TO ESTIMATE PLATELET CLUMPS NOTED ON SMEAR, UNABLE TO ESTIMATE PLATELET CLUMPS NOTED ON SMEAR, COUNT APPEARS DECREASED PLATELET CLUMPS NOTED ON SMEAR, UNABLE TO ESTIMATE PLATELET CLUMPS NOTED ON SMEAR, COUNT APPEARS ADEQUATE   Cardiac Enzymes: No results for input(s): CKTOTAL, CKMB, CKMBINDEX, TROPONINI in the last 168 hours. BNP: Invalid input(s): POCBNP CBG: No results for input(s): GLUCAP in the last 168 hours. D-Dimer Recent Labs  11/26/17 0837  DDIMER 1.63*   Hgb A1c No results for input(s): HGBA1C in the last 72 hours. Lipid Profile No results for input(s): CHOL, HDL, LDLCALC, TRIG, CHOLHDL, LDLDIRECT in the last 72 hours. Thyroid function studies No results for input(s): TSH, T4TOTAL, T3FREE, THYROIDAB in the last 72 hours.  Invalid input(s): FREET3 Anemia work up No results for input(s): VITAMINB12, FOLATE, FERRITIN, TIBC, IRON, RETICCTPCT in the last 72 hours. Urinalysis    Component Value Date/Time   COLORURINE YELLOW 11/24/2017 1944   APPEARANCEUR CLOUDY (A) 11/24/2017 1944   LABSPEC 1.012 11/24/2017 1944   LABSPEC 1.010 06/09/2007 1500   PHURINE 6.0 11/24/2017 1944   GLUCOSEU NEGATIVE 11/24/2017 1944   GLUCOSEU NEGATIVE 03/19/2016 1030   HGBUR LARGE (A) 11/24/2017 1944   HGBUR negative 05/09/2010 1026   BILIRUBINUR NEGATIVE 11/24/2017 1944   BILIRUBINUR N 05/01/2016 1104   BILIRUBINUR Negative 06/09/2007 1500   KETONESUR NEGATIVE 11/24/2017 1944   PROTEINUR NEGATIVE 11/24/2017 1944   UROBILINOGEN negative 05/01/2016 1104   UROBILINOGEN 0.2 03/19/2016 1030   NITRITE POSITIVE (A) 11/24/2017 1944   LEUKOCYTESUR LARGE (A) 11/24/2017 1944   LEUKOCYTESUR Negative 06/09/2007 1500    Sepsis Labs Invalid input(s): PROCALCITONIN,  WBC,  LACTICIDVEN Microbiology Recent Results (from the past 240 hour(s))  Blood Culture (routine x 2)     Status: None (Preliminary result)   Collection Time: 11/24/17  1:25 PM  Result Value Ref Range Status   Specimen Description BLOOD LEFT FOREARM  Final   Special Requests   Final    BOTTLES DRAWN AEROBIC AND ANAEROBIC Blood Culture adequate volume   Culture   Final    NO GROWTH 4 DAYS Performed at [facility], [place] [address] [place], [place] [address]    Report Status PENDING  Incomplete  Blood Culture (routine x 2)     Status: Abnormal   Collection Time: 11/24/17  2:58 PM  Result Value Ref Range Status   Specimen Description BLOOD LEFT HAND  Final   Special Requests   Final    BOTTLES DRAWN AEROBIC AND ANAEROBIC Blood Culture adequate volume   Culture  Setup Time   Final    ANAEROBIC BOTTLE ONLY GRAM NEGATIVE RODS CRITICAL RESULT CALLED TO, READ BACK BY AND VERIFIED WITH: Asencion Islam PharmD 10:30 11/25/17 ([email or web address]) Performed at [facility], [address], [place], [place] [address]    Culture ESCHERICHIA COLI (A)  Final   Report Status 11/27/2017 FINAL  Final   Organism ID, Bacteria ESCHERICHIA COLI  Final      Susceptibility   Escherichia coli - MIC*    AMPICILLIN 4 SENSITIVE Sensitive     CEFAZOLIN <=4 SENSITIVE Sensitive     CEFEPIME <=1 SENSITIVE Sensitive     CEFTAZIDIME <=1 SENSITIVE Sensitive     CEFTRIAXONE <=1 SENSITIVE Sensitive     CIPROFLOXACIN <=0.25 SENSITIVE Sensitive     GENTAMICIN <=1 SENSITIVE Sensitive     IMIPENEM <=0.25 SENSITIVE Sensitive     TRIMETH/SULFA <=20 SENSITIVE Sensitive     AMPICILLIN/SULBACTAM <=2 SENSITIVE Sensitive     PIP/TAZO <=4 SENSITIVE Sensitive     Extended ESBL NEGATIVE Sensitive     * ESCHERICHIA COLI  Blood Culture ID Panel (Reflexed)     Status: Abnormal   Collection Time: 11/24/17  2:58 PM  Result Value Ref Range Status   Enterococcus species NOT  DETECTED NOT DETECTED Final   Listeria monocytogenes NOT DETECTED NOT DETECTED Final   Staphylococcus species NOT  DETECTED NOT DETECTED Final   Staphylococcus aureus NOT DETECTED NOT DETECTED Final   Streptococcus species NOT DETECTED NOT DETECTED Final   Streptococcus agalactiae NOT DETECTED NOT DETECTED Final   Streptococcus pneumoniae NOT DETECTED NOT DETECTED Final   Streptococcus pyogenes NOT DETECTED NOT DETECTED Final   Acinetobacter baumannii NOT DETECTED NOT DETECTED Final   Enterobacteriaceae species DETECTED (A) NOT DETECTED Final    Comment: Enterobacteriaceae represent a large family of gram-negative bacteria, not a single organism. CRITICAL RESULT CALLED TO, READ BACK BY AND VERIFIED WITH: Jacinto Reap Mancheril PharmD 10:30 11/25/17 ([email or web address])    Enterobacter cloacae complex NOT DETECTED NOT DETECTED Final   Escherichia coli DETECTED (A) NOT DETECTED Final    Comment: CRITICAL RESULT CALLED TO, READ BACK BY AND VERIFIED WITH: Asencion Islam PharmD 10:30 11/25/17 (wilsonm)    Klebsiella oxytoca NOT DETECTED NOT DETECTED Final   Klebsiella pneumoniae NOT DETECTED NOT DETECTED Final   Proteus species NOT DETECTED NOT DETECTED Final   Serratia marcescens NOT DETECTED NOT DETECTED Final   Carbapenem resistance NOT DETECTED NOT DETECTED Final   Haemophilus influenzae NOT DETECTED NOT DETECTED Final   Neisseria meningitidis NOT DETECTED NOT DETECTED Final   Pseudomonas aeruginosa NOT DETECTED NOT DETECTED Final   Candida albicans NOT DETECTED NOT DETECTED Final   Candida glabrata NOT DETECTED NOT DETECTED Final   Candida krusei NOT DETECTED NOT DETECTED Final   Candida parapsilosis NOT DETECTED NOT DETECTED Final   Candida tropicalis NOT DETECTED NOT DETECTED Final    Comment: Performed at [facility], [place] [address], [place], [place] [address]  Urine culture     Status: Abnormal   Collection Time: 11/24/17  7:45 PM  Result Value Ref Range Status   Specimen Description URINE, CLEAN  CATCH  Final   Special Requests   Final    NONE Performed at [facility], [place] [address], [place], [place] [address]    Culture MULTIPLE SPECIES PRESENT, SUGGEST RECOLLECTION (A)  Final   Report Status 11/26/2017 FINAL  Final  MRSA PCR Screening     Status: Abnormal   Collection Time: 11/24/17 11:57 PM  Result Value Ref Range Status   MRSA by PCR POSITIVE (A) NEGATIVE Final    Comment:        The GeneXpert MRSA Assay (FDA approved for NASAL specimens only), is one component of a comprehensive MRSA colonization surveillance program. It is not intended to diagnose MRSA infection nor to guide or monitor treatment for MRSA infections. RESULT CALLED TO, READ BACK BY AND VERIFIED WITH: Dorise Bullion RN 11/25/17 0324 JDW   Culture, blood (routine x 2)     Status: None (Preliminary result)   Collection Time: 11/27/17  8:11 AM  Result Value Ref Range Status   Specimen Description BLOOD LEFT ANTECUBITAL  Final   Special Requests   Final    BOTTLES DRAWN AEROBIC ONLY Blood Culture results may not be optimal due to an inadequate volume of blood received in culture bottles   Culture   Final    NO GROWTH < 24 HOURS Performed at [facility], [place] [address], [place], [place] [address]    Report Status PENDING  Incomplete  Culture, blood (routine x 2)     Status: None (Preliminary result)   Collection Time: 11/27/17  8:19 AM  Result Value Ref Range Status   Specimen Description BLOOD LEFT ARM  Final   Special Requests   Final    BOTTLES DRAWN  AEROBIC ONLY Blood Culture results may not be optimal due to an inadequate volume of blood received in culture bottles   Culture   Final    NO GROWTH < 24 HOURS Performed at [place] [address], [place], [place] [address]    Report Status PENDING  Incomplete     Time coordinating discharge: 37 minutes.   SIGNED:   Elmarie Shiley, MD  [facility] 11/28/2017, 12:23 PM Pager [phone]  If 7PM-7AM, please  contact night-coverage [email or web address] Password [email or web address]

## 2017-11-29 LAB — BASIC METABOLIC PANEL
Anion gap: 6 (ref 5–15)
BUN: 16 mg/dL (ref 6–20)
CHLORIDE: 103 mmol/L (ref 98–111)
CO2: 30 mmol/L (ref 22–32)
CREATININE: 0.85 mg/dL (ref 0.44–1.00)
Calcium: 7.8 mg/dL — ABNORMAL LOW (ref 8.9–10.3)
Glucose, Bld: 85 mg/dL (ref 70–99)
Potassium: 4.1 mmol/L (ref 3.5–5.1)
SODIUM: 139 mmol/L (ref 135–145)

## 2017-11-29 LAB — CULTURE, BLOOD (ROUTINE X 2)
Culture: NO GROWTH
Special Requests: ADEQUATE

## 2017-11-29 MED ORDER — POTASSIUM CHLORIDE CRYS ER 20 MEQ PO TBCR: 40.0000 meq | EXTENDED_RELEASE_TABLET | Freq: Once | ORAL | Status: DC

## 2017-11-29 NOTE — Discharge Summary (Signed)
Physician Discharge Summary  [name] NUU:[id number] DOB: 1956-01-31 DOA: 11/24/2017  PCP: Debbrah Alar, NP  Admit date: 11/24/2017 Discharge date: 11/29/2017  Admitted From: Home  Disposition: Home   Recommendations for Outpatient Follow-up:  1. Follow up with PCP in 1-2 weeks 2. Please obtain BMP/CBC in one week 3. Please follow up on the following pending results: follow final blood culture results.  4. Needs to follow up with urology for stent and stone treatment.  5. Needs to follow up with Dr Marin Olp to follow platelet count.     Discharge Condition: Stable.  CODE STATUS: full code.  Diet recommendation: Heart Healthy  Brief/Interim Summary:  Brief Narrative: [name],[name] , [name] Patient received IV fluids in the emergency room and improved and her mental status gradually improved as well. Code sepsis was called and patient received IV antibiotics as well. She is alert awake and can converse at this time. She denies any preceding chest pains shortness of breath or urinary symptoms. In the emergency room she had to be catheterized and the urine analysis was positive for UTI.     Assessment & Plan:   Active Problems:   Hypothyroidism   Atrial fibrillation ([place])   Sepsis ([place])   1-Sepsis; secondary to UTI> right ureter stone.  E coli Bacteremia;  Continue with IV fluids, IV bolus.  Change antibiotics to ceftriaxone. dioscorine vancomycin cefepime.  CT ; 10 x 10 x 8 mm stone in the distal right ureter with periureteric edema/inflammation tracking proximally up to the right kidney. There is associated mild fullness of the right intrarenal collecting system and ureter proximal to the stone.  Given the history of fever and mental status changes, infection proximal to the obstruction is a distinct consideration. Bilateral nephrolithiasis. 3. Hepatomegaly with hepatic steatosis. -urology consulted. Patient made NPO. Underwent ureteral stent placement 7-03.  WBC decreased to 11. Blood culture one of two grew  E coli, sensitivity to ciprofloxacin.   Repeat Blood culture 7-05 no growth to date.  NSL.   2-UTI, urine retention.  Foley removed.  resolved.   3-Acute encephalopathy, toxic. Related to infection.  Resolved  4-Thrombocytopenia; suspect related to infection. DIC panel negative.  Repeated cbc today , count appears improved, normal  5-A fib; hold eliquis due to severe thrombocytopenia. On Cardizem, holder parameter for BP.  discussed with Dr Burr Medico, per pathologist platelet count of lab 7-05 appears to be at 50 k. Ok to resume eliquis, close follow up with Dr Marin Olp.   6-Hypocalcemia. Continue with  supplementation   7-Palmar rash, also thigh back. Cellulitis.  RPR negative. Improved with doxycycline. treated with 3 days of doxy and one day of vancomycin.   8-LE Edema; after fluid resuscitation. Resume home dose lasix. Will give 40 mg Iv today prior to discharge   Oral thrush; treated with fluconazole.   Delay in discharge due to volume overload. She is feeling better, urine out put 9 L.  Follow renal function today, and K level. Likely discharge today if labs stable.   Discharge Diagnoses:  Active Problems:   Hypothyroidism   Atrial fibrillation ([place])   Right ureteral stone   Sepsis ([place])   Sepsis due to Escherichia coli (E. coli) ([place])   E coli bacteremia    Discharge  Instructions  Discharge Instructions    Diet - low sodium heart healthy   Complete by:  As directed    Diet - low sodium heart healthy   Complete by:  As directed    Increase activity slowly   Complete by:  As directed    Increase activity slowly   Complete by:  As directed       Allergies as of 11/29/2017      Reactions   Other Anaphylaxis   NUTS   Penicillins Other (See Comments)   Headache. Has patient had a PCN reaction causing immediate rash, facial/tongue/throat swelling, SOB or lightheadedness with hypotension: No Has patient had a PCN reaction causing severe rash involving mucus membranes or skin necrosis: No Has patient had a PCN reaction that required hospitalization: No Has patient had a PCN reaction occurring within the last 10 years: Yes If all of the above answers are "NO", then may proceed with Cephalosporin use.   Amoxicillin-pot Clavulanate Other (See Comments)   headache   Food Hives   Potato   Tomato Hives      Medication List    STOP taking these medications   losartan 50 MG tablet Commonly known as:  COZAAR   nystatin cream Commonly known as:  MYCOSTATIN Replaced by:  nystatin powder     TAKE these medications   acetaminophen 325 MG tablet Commonly known as:  TYLENOL Take 2 tablets (650 mg total) by mouth every 6 (six) hours as needed for mild pain or moderate pain.   albuterol 108 (90 Base) MCG/ACT inhaler Commonly known as:  PROVENTIL HFA;VENTOLIN HFA Inhale 2 puffs into the lungs every 6 (six) hours as needed for wheezing or shortness of breath.   apixaban 5 MG Tabs tablet Commonly known as:  ELIQUIS Take 5 mg by mouth 2 (two) times daily.   calcitRIOL 0.5 MCG capsule Commonly known as:  ROCALTROL Take 2 mcg by mouth daily.   calcium carbonate 500 MG chewable tablet Commonly known as:  TUMS - dosed in mg elemental calcium Chew 2 tablets (400 mg of elemental calcium total) by mouth 4 (four) times daily.   ciprofloxacin 500 MG tablet Commonly known as:  CIPRO Take 1 tablet (500 mg total) by mouth 2 (two) times daily.   citric acid-potassium citrate 1100-334 MG/5ML solution Commonly known as:  POLYCITRA Take 10 mEq by mouth daily.   desoximetasone 0.05 % cream Commonly known as:  TOPICORT Apply topically 2  (two) times daily as needed.   diltiazem 240 MG 24 hr capsule Commonly known as:  CARDIZEM CD TAKE 1 CAPSULE BY MOUTH  DAILY   diphenhydrAMINE 25 mg capsule Commonly known as:  BENADRYL Take 25 mg by mouth every 8 (eight) hours as needed for itching.   fluticasone 50 MCG/ACT nasal spray Commonly known as:  FLONASE Place 2 sprays into both nostrils daily as needed for allergies or rhinitis.   furosemide 40 MG tablet Commonly known as:  LASIX 2 tabs by mouth daily in the AM and 1 tablet in the PM What changed:    how much to take  how to take this  when to take this  additional instructions   levocetirizine 5 MG tablet Commonly known as:  XYZAL TAKE 1 TABLET BY MOUTH  EVERY EVENING   levothyroxine 100 MCG tablet Commonly known as:  SYNTHROID, LEVOTHROID Take 300 mcg by mouth daily.   neomycin-bacitracin-polymyxin ointment Commonly known as:  NEOSPORIN Apply 1 application topically as needed for wound  care. apply to eye   nystatin powder Commonly known as:  MYCOSTATIN/NYSTOP Apply topically 3 (three) times daily. Replaces:  nystatin cream   omeprazole 40 MG capsule Commonly known as:  PRILOSEC TAKE 1 CAPSULE BY MOUTH  DAILY   saccharomyces boulardii 250 MG capsule Commonly known as:  FLORASTOR Take 1 capsule (250 mg total) by mouth 2 (two) times daily.   SYMBICORT 160-4.5 MCG/ACT inhaler Generic drug:  budesonide-formoterol INHALE 2 PUFFS TWO TIMES  DAILY   traMADol 50 MG tablet Commonly known as:  ULTRAM Take 1 tablet (50 mg total) by mouth every 12 (twelve) hours as needed.   VITAMIN B-12 IJ Inject as directed every 30 (thirty) days.   Vitamin D (Ergocalciferol) 50000 units Caps capsule Commonly known as:  DRISDOL TAKE 1 CAPSULE BY MOUTH 3  TIMES WEEKLY   zafirlukast 20 MG tablet Commonly known as:  ACCOLATE TAKE 1 TABLET BY MOUTH TWO  TIMES DAILY BEFORE MEALS      Follow-up Information    [facility] Follow up.   Contact  information: [place] .   Specialty:  Urology Contact information: [address] [place]       Volanda Napoleon, MD Follow up in 3 day(s).   Specialty:  Oncology Contact information: [address] [place] [address] [phone]        [facility] Follow up.   Specialty:  Home Health Services Why:  Physical therapist and RN will contact you to set up appointments.  Contact information: [place] [address] [phone]          Allergies  Allergen Reactions  . Other Anaphylaxis    NUTS  . Penicillins Other (See Comments)    Headache. Has patient had a PCN reaction causing immediate rash, facial/tongue/throat swelling, SOB or lightheadedness with hypotension: No Has patient had a PCN reaction causing severe rash involving mucus membranes or skin necrosis: No Has patient had a PCN reaction that required hospitalization: No Has patient had a PCN reaction occurring within the last 10 years: Yes If all of the above answers are "NO", then may proceed with Cephalosporin use.   [name] Amoxicillin-Pot Clavulanate Other (See Comments)    headache  . Food Hives    Potato  . Tomato Hives    Consultations:  Urology  Hematology, phone consult   Procedures/Studies: Dg Chest Port 1 View  Result Date: 11/24/2017 CLINICAL DATA:  Cough and fever EXAM: PORTABLE CHEST 1 VIEW COMPARISON:  08/28/2017 chest radiograph. FINDINGS: In Stable cardiomediastinal silhouette with normal heart size. No pneumothorax. No pleural effusion. Lungs appear clear, with no acute consolidative airspace disease and no pulmonary edema. IMPRESSION: No active disease. Electronically Signed   By: Ilona Sorrel M.D.   On: 11/24/2017 13:42   Dg C-arm 1-60 Min-no Report  Result Date: 11/25/2017 Fluoroscopy was  utilized by the requesting physician.  No radiographic interpretation.   Ct Renal Stone Study  Result Date: 11/25/2017 CLINICAL DATA:  Altered mental status and fever. EXAM: CT ABDOMEN AND PELVIS WITHOUT CONTRAST TECHNIQUE: Multidetector CT imaging of the abdomen and pelvis was performed following the standard protocol without IV contrast. COMPARISON:  12/10/2016 FINDINGS: Lower chest: Dependent atelectasis noted in the lower lobes bilaterally. Hepatobiliary: The liver shows diffusely decreased attenuation suggesting steatosis. Liver measures  23.9 cm craniocaudal length, enlarged. Gallbladder is distended. No intrahepatic or extrahepatic biliary dilation. Pancreas: No focal mass lesion. No dilatation of the main duct. No intraparenchymal cyst. No peripancreatic edema. Spleen: No splenomegaly. No focal mass lesion. Adrenals/Urinary Tract: No adrenal nodule or mass. Multiple stones are seen in the kidneys bilaterally. Dominant stone in the left kidney fills a lower pole collecting system and measures 1.7 x 0.9 x 2.4 cm. Left ureter is unremarkable. Multiple stones are noted in the right kidney including a posterior interpolar stone measuring 11 x 7 x 16 mm. There is mild fullness of the right intrarenal collecting system with right perinephric edema/inflammation. Several tiny stones are seen in the right renal pelvis. Edema/inflammation is seen around the mildly distended right ureter. 10 x 10 x 8 mm distal right ureteral stone is identified on axial imaging (series 3: Image 73). Foley catheter decompresses the urinary bladder. Stomach/Bowel: Stomach is nondistended. No gastric wall thickening. No evidence of outlet obstruction. Duodenum is normally positioned as is the ligament of Treitz. No small bowel wall thickening. No small bowel dilatation. The terminal ileum is normal. The appendix is not visualized, but there is no edema or inflammation in the region of the cecum. No gross colonic mass. No colonic wall  thickening. No substantial diverticular change. Vascular/Lymphatic: There is abdominal aortic atherosclerosis without aneurysm. There is no gastrohepatic or hepatoduodenal ligament lymphadenopathy. No intraperitoneal or retroperitoneal lymphadenopathy. No pelvic sidewall lymphadenopathy. Reproductive: IUD is visualized in the uterus. There is no adnexal mass. Other: No intraperitoneal free fluid. Musculoskeletal: No worrisome lytic or sclerotic osseous abnormality. IMPRESSION: 1. 10 x 10 x 8 mm stone in the distal right ureter with periureteric edema/inflammation tracking proximally up to the right kidney. There is associated mild fullness of the right intrarenal collecting system and ureter proximal to the stone. Given the history of fever and mental status changes, infection proximal to the obstruction is a distinct consideration. 2. Bilateral nephrolithiasis. 3. Hepatomegaly with hepatic steatosis. 4.  Aortic Atherosclerois (ICD10-170.0) Electronically Signed   By: Misty Stanley M.D.   On: 11/25/2017 10:43    Subjective: Feeling better. LE edema.    Discharge Exam: Vitals:   11/28/17 2116 11/29/17 0505  BP:  136/69  Pulse:  68  Resp:  15  Temp:  98.9 F (37.2 C)  SpO2: 97% 95%   Vitals:   11/28/17 1859 11/28/17 2005 11/28/17 2116 11/29/17 0505  BP: 130/75 136/69  136/69  Pulse: 73 72  68  Resp:  20  15  Temp: 98.2 F (36.8 C) 97.9 F (36.6 C)  98.9 F (37.2 C)  TempSrc: Oral Oral  Oral  SpO2: 96% 96% 97% 95%  Weight:      Height:        General: Pt is alert, awake, not in acute distress Cardiovascular: RRR, S1/S2 +, no rubs, no gallops Respiratory: CTA bilaterally, no wheezing, no rhonchi Abdominal: Soft, NT, ND, bowel sounds + Extremities: no edema, no cyanosis    The results of significant diagnostics from this hospitalization (including imaging, microbiology, ancillary and laboratory) are listed below for reference.     Microbiology: Recent Results (from the past 240  hour(s))  Blood Culture (routine x 2)     Status: None (Preliminary result)   Collection Time: 11/24/17  1:25 PM  Result Value Ref Range Status   Specimen Description BLOOD LEFT FOREARM  Final   Special Requests   Final    BOTTLES DRAWN AEROBIC AND ANAEROBIC Blood Culture  adequate volume   Culture   Final    NO GROWTH 4 DAYS Performed at [facility], [place] [address], [place],  [address]    Report Status PENDING  Incomplete  Blood Culture (routine x 2)     Status: Abnormal   Collection Time: 11/24/17  2:58 PM  Result Value Ref Range Status   Specimen Description BLOOD LEFT HAND  Final   Special Requests   Final    BOTTLES DRAWN AEROBIC AND ANAEROBIC Blood Culture adequate volume   Culture  Setup Time   Final    ANAEROBIC BOTTLE ONLY GRAM NEGATIVE RODS CRITICAL RESULT CALLED TO, READ BACK BY AND VERIFIED WITH: Asencion Islam PharmD 10:30 11/25/17 ([email or web address]) Performed at [facility], [address] [place], [place] [address]    Culture ESCHERICHIA COLI (A)  Final   Report Status 11/27/2017 FINAL  Final   Organism ID, Bacteria ESCHERICHIA COLI  Final      Susceptibility   Escherichia coli - MIC*    AMPICILLIN 4 SENSITIVE Sensitive     CEFAZOLIN <=4 SENSITIVE Sensitive     CEFEPIME <=1 SENSITIVE Sensitive     CEFTAZIDIME <=1 SENSITIVE Sensitive     CEFTRIAXONE <=1 SENSITIVE Sensitive     CIPROFLOXACIN <=0.25 SENSITIVE Sensitive     GENTAMICIN <=1 SENSITIVE Sensitive     IMIPENEM <=0.25 SENSITIVE Sensitive     TRIMETH/SULFA <=20 SENSITIVE Sensitive     AMPICILLIN/SULBACTAM <=2 SENSITIVE Sensitive     PIP/TAZO <=4 SENSITIVE Sensitive     Extended ESBL NEGATIVE Sensitive     * ESCHERICHIA COLI  Blood Culture ID Panel (Reflexed)     Status: Abnormal   Collection Time: 11/24/17  2:58 PM  Result Value Ref Range Status   Enterococcus species NOT DETECTED NOT DETECTED Final   Listeria monocytogenes NOT DETECTED NOT DETECTED Final   Staphylococcus species NOT DETECTED  NOT DETECTED Final   Staphylococcus aureus NOT DETECTED NOT DETECTED Final   Streptococcus species NOT DETECTED NOT DETECTED Final   Streptococcus agalactiae NOT DETECTED NOT DETECTED Final   Streptococcus pneumoniae NOT DETECTED NOT DETECTED Final   Streptococcus pyogenes NOT DETECTED NOT DETECTED Final   Acinetobacter baumannii NOT DETECTED NOT DETECTED Final   Enterobacteriaceae species DETECTED (A) NOT DETECTED Final    Comment: Enterobacteriaceae represent a large family of gram-negative bacteria, not a single organism. CRITICAL RESULT CALLED TO, READ BACK BY AND VERIFIED WITH: Jacinto Reap Mancheril PharmD 10:30 11/25/17 ([email or web address])    Enterobacter cloacae complex NOT DETECTED NOT DETECTED Final   Escherichia coli DETECTED (A) NOT DETECTED Final    Comment: CRITICAL RESULT CALLED TO, READ BACK BY AND VERIFIED WITH: Asencion Islam PharmD 10:30 11/25/17 ([email or web address])    Klebsiella oxytoca NOT DETECTED NOT DETECTED Final   Klebsiella pneumoniae NOT DETECTED NOT DETECTED Final   Proteus species NOT DETECTED NOT DETECTED Final   Serratia marcescens NOT DETECTED NOT DETECTED Final   Carbapenem resistance NOT DETECTED NOT DETECTED Final   Haemophilus influenzae NOT DETECTED NOT DETECTED Final   Neisseria meningitidis NOT DETECTED NOT DETECTED Final   Pseudomonas aeruginosa NOT DETECTED NOT DETECTED Final   Candida albicans NOT DETECTED NOT DETECTED Final   Candida glabrata NOT DETECTED NOT DETECTED Final   Candida krusei NOT DETECTED NOT DETECTED Final   Candida parapsilosis NOT DETECTED NOT DETECTED Final   Candida tropicalis NOT DETECTED NOT DETECTED Final    Comment: Performed at [facility], [place] [address] [place], [place]  [address]  Urine culture     Status: Abnormal   Collection Time: 11/24/17  7:45 PM  Result Value Ref Range Status   Specimen Description URINE, CLEAN CATCH  Final   Special Requests   Final    NONE Performed at [facility], [place] [address], [place], [place]  [address]    Culture MULTIPLE SPECIES PRESENT, SUGGEST RECOLLECTION (A)  Final   Report Status 11/26/2017 FINAL  Final  MRSA PCR Screening     Status: Abnormal   Collection Time: 11/24/17 11:57 PM  Result Value Ref Range Status   MRSA by PCR POSITIVE (A) NEGATIVE Final    Comment:        The GeneXpert MRSA Assay (FDA approved for NASAL specimens only), is one component of a comprehensive MRSA colonization surveillance program. It is not intended to diagnose MRSA infection nor to guide or monitor treatment for MRSA infections. RESULT CALLED TO, READ BACK BY AND VERIFIED WITH: Dorise Bullion RN 11/25/17 0324 JDW   Culture, blood (routine x 2)     Status: None (Preliminary result)   Collection Time: 11/27/17  8:11 AM  Result Value Ref Range Status   Specimen Description BLOOD LEFT ANTECUBITAL  Final   Special Requests   Final    BOTTLES DRAWN AEROBIC ONLY Blood Culture results may not be optimal due to an inadequate volume of blood received in culture bottles   Culture   Final    NO GROWTH < 24 HOURS Performed at [facility], [place] [address], [place], [place] [address]    Report Status PENDING  Incomplete  Culture, blood (routine x 2)     Status: None (Preliminary result)   Collection Time: 11/27/17  8:19 AM  Result Value Ref Range Status   Specimen Description BLOOD LEFT ARM  Final   Special Requests   Final    BOTTLES DRAWN AEROBIC ONLY Blood Culture results may not be optimal due to an inadequate volume of blood received in culture bottles   Culture   Final    NO GROWTH < 24 HOURS Performed at [facility], [place] [address], [place], [place] [address]    Report Status PENDING  Incomplete     Labs: BNP (last 3 results) No results for input(s): BNP in the last 8760 hours. Basic Metabolic Panel: Recent Labs  Lab 11/24/17 1431 11/25/17 0106 11/26/17 0637 11/28/17 0934  NA 140 141 142 142  K 3.3* 3.5 3.9 4.1  CL 100 108 110 108  CO2 23 24 23 25   GLUCOSE 104* 112*  142* 89  BUN 23* 18 11 16   CREATININE 1.20* 0.95 0.82 0.84  CALCIUM 7.5* 6.5* 6.9* 7.8*   Liver Function Tests: Recent Labs  Lab 11/24/17 1431  AST 27  ALT 37  ALKPHOS 102  BILITOT 1.1  PROT 6.0*  ALBUMIN 3.1*   No results for input(s): LIPASE, AMYLASE in the last 168 hours. No results for input(s): AMMONIA in the last 168 hours. CBC: Recent Labs  Lab 11/24/17 1431 11/25/17 0106 11/25/17 0959 11/26/17 9622 11/26/17 0837 11/26/17 1533 11/27/17 0951 11/28/17 0934  WBC 21.9* 26.6* 19.6* 10.6*  --  11.7*  --  8.9  NEUTROABS 21.1*  --   --   --   --   --   --   --   HGB 13.9 12.4 12.5 12.5  --  12.3  --  12.7  HCT 44.0 39.5 39.9 40.3  --  39.6  --  41.4  MCV 90.5 90.6 91.1 91.2  --  91.0  --  91.8  PLT PLATELET CLUMPS NOTED ON SMEAR, COUNT APPEARS DECREASED 38* PLATELET CLUMPS NOTED ON SMEAR, UNABLE TO ESTIMATE PLATELET CLUMPS NOTED ON SMEAR, UNABLE TO ESTIMATE PLATELET CLUMPS NOTED ON SMEAR, UNABLE TO ESTIMATE PLATELET CLUMPS NOTED ON SMEAR, COUNT APPEARS DECREASED PLATELET CLUMPS NOTED ON SMEAR, UNABLE TO ESTIMATE PLATELET CLUMPS NOTED ON SMEAR, COUNT APPEARS ADEQUATE   Cardiac Enzymes: No results for input(s): CKTOTAL, CKMB, CKMBINDEX, TROPONINI in the last 168 hours. BNP: Invalid input(s): POCBNP CBG: No results for input(s): GLUCAP in the last 168 hours. D-Dimer Recent Labs    11/26/17 0837  DDIMER 1.63*   Hgb A1c No results for input(s): HGBA1C in the last 72 hours. Lipid Profile No results for input(s): CHOL, HDL, LDLCALC, TRIG, CHOLHDL, LDLDIRECT in the last 72 hours. Thyroid function studies No results for input(s): TSH, T4TOTAL, T3FREE, THYROIDAB in the last 72 hours.  Invalid input(s): FREET3 Anemia work up No results for input(s): VITAMINB12, FOLATE, FERRITIN, TIBC, IRON, RETICCTPCT in the last 72 hours. Urinalysis    Component Value Date/Time   COLORURINE YELLOW 11/24/2017 1944   APPEARANCEUR CLOUDY (A) 11/24/2017 1944   LABSPEC 1.012 11/24/2017  1944   LABSPEC 1.010 06/09/2007 1500   PHURINE 6.0 11/24/2017 1944   GLUCOSEU NEGATIVE 11/24/2017 1944   GLUCOSEU NEGATIVE 03/19/2016 1030   HGBUR LARGE (A) 11/24/2017 1944   HGBUR negative 05/09/2010 1026   BILIRUBINUR NEGATIVE 11/24/2017 1944   BILIRUBINUR N 05/01/2016 1104   BILIRUBINUR Negative 06/09/2007 1500   KETONESUR NEGATIVE 11/24/2017 1944   PROTEINUR NEGATIVE 11/24/2017 1944   UROBILINOGEN negative 05/01/2016 1104   UROBILINOGEN 0.2 03/19/2016 1030   NITRITE POSITIVE (A) 11/24/2017 1944   LEUKOCYTESUR LARGE (A) 11/24/2017 1944   LEUKOCYTESUR Negative 06/09/2007 1500   Sepsis Labs Invalid input(s): PROCALCITONIN,  WBC,  LACTICIDVEN Microbiology Recent Results (from the past 240 hour(s))  Blood Culture (routine x 2)     Status: None (Preliminary result)   Collection Time: 11/24/17  1:25 PM  Result Value Ref Range Status   Specimen Description BLOOD LEFT FOREARM  Final   Special Requests   Final    BOTTLES DRAWN AEROBIC AND ANAEROBIC Blood Culture adequate volume   Culture   Final    NO GROWTH 4 DAYS Performed at [facility], [place] [address], [place], [place] [address]    Report Status PENDING  Incomplete  Blood Culture (routine x 2)     Status: Abnormal   Collection Time: 11/24/17  2:58 PM  Result Value Ref Range Status   Specimen Description BLOOD LEFT HAND  Final   Special Requests   Final    BOTTLES DRAWN AEROBIC AND ANAEROBIC Blood Culture adequate volume   Culture  Setup Time   Final    ANAEROBIC BOTTLE ONLY GRAM NEGATIVE RODS CRITICAL RESULT CALLED TO, READ BACK BY AND VERIFIED WITH: Asencion Islam PharmD 10:30 11/25/17 ([email or web address]) Performed at [facility], [address] [place], [place] [address]    Culture ESCHERICHIA COLI (A)  Final   Report Status 11/27/2017 FINAL  Final   Organism ID, Bacteria ESCHERICHIA COLI  Final      Susceptibility   Escherichia coli - MIC*    AMPICILLIN 4 SENSITIVE Sensitive     CEFAZOLIN <=4 SENSITIVE Sensitive      CEFEPIME <=1 SENSITIVE Sensitive     CEFTAZIDIME <=1 SENSITIVE Sensitive     CEFTRIAXONE <=1 SENSITIVE Sensitive  CIPROFLOXACIN <=0.25 SENSITIVE Sensitive     GENTAMICIN <=1 SENSITIVE Sensitive     IMIPENEM <=0.25 SENSITIVE Sensitive     TRIMETH/SULFA <=20 SENSITIVE Sensitive     AMPICILLIN/SULBACTAM <=2 SENSITIVE Sensitive     PIP/TAZO <=4 SENSITIVE Sensitive     Extended ESBL NEGATIVE Sensitive     * ESCHERICHIA COLI  Blood Culture ID Panel (Reflexed)     Status: Abnormal   Collection Time: 11/24/17  2:58 PM  Result Value Ref Range Status   Enterococcus species NOT DETECTED NOT DETECTED Final   Listeria monocytogenes NOT DETECTED NOT DETECTED Final   Staphylococcus species NOT DETECTED NOT DETECTED Final   Staphylococcus aureus NOT DETECTED NOT DETECTED Final   Streptococcus species NOT DETECTED NOT DETECTED Final   Streptococcus agalactiae NOT DETECTED NOT DETECTED Final   Streptococcus pneumoniae NOT DETECTED NOT DETECTED Final   Streptococcus pyogenes NOT DETECTED NOT DETECTED Final   Acinetobacter baumannii NOT DETECTED NOT DETECTED Final   Enterobacteriaceae species DETECTED (A) NOT DETECTED Final    Comment: Enterobacteriaceae represent a large family of gram-negative bacteria, not a single organism. CRITICAL RESULT CALLED TO, READ BACK BY AND VERIFIED WITH: Jacinto Reap Mancheril PharmD 10:30 11/25/17 ([email or web address])    Enterobacter cloacae complex NOT DETECTED NOT DETECTED Final   Escherichia coli DETECTED (A) NOT DETECTED Final    Comment: CRITICAL RESULT CALLED TO, READ BACK BY AND VERIFIED WITH: Asencion Islam PharmD 10:30 11/25/17 (wilsonm)    Klebsiella oxytoca NOT DETECTED NOT DETECTED Final   Klebsiella pneumoniae NOT DETECTED NOT DETECTED Final   Proteus species NOT DETECTED NOT DETECTED Final   Serratia marcescens NOT DETECTED NOT DETECTED Final   Carbapenem resistance NOT DETECTED NOT DETECTED Final   Haemophilus influenzae NOT DETECTED NOT DETECTED Final   Neisseria  meningitidis NOT DETECTED NOT DETECTED Final   Pseudomonas aeruginosa NOT DETECTED NOT DETECTED Final   Candida albicans NOT DETECTED NOT DETECTED Final   Candida glabrata NOT DETECTED NOT DETECTED Final   Candida krusei NOT DETECTED NOT DETECTED Final   Candida parapsilosis NOT DETECTED NOT DETECTED Final   Candida tropicalis NOT DETECTED NOT DETECTED Final    Comment: Performed at [facility], [place] [address] [place], [place] [address]  Urine culture     Status: Abnormal   Collection Time: 11/24/17  7:45 PM  Result Value Ref Range Status   Specimen Description URINE, CLEAN CATCH  Final   Special Requests   Final    NONE Performed at [facility], [place] [address], [place], [place] [address]    Culture MULTIPLE SPECIES PRESENT, SUGGEST RECOLLECTION (A)  Final   Report Status 11/26/2017 FINAL  Final  MRSA PCR Screening     Status: Abnormal   Collection Time: 11/24/17 11:57 PM  Result Value Ref Range Status   MRSA by PCR POSITIVE (A) NEGATIVE Final    Comment:        The GeneXpert MRSA Assay (FDA approved for NASAL specimens only), is one component of a comprehensive MRSA colonization surveillance program. It is not intended to diagnose MRSA infection nor to guide or monitor treatment for MRSA infections. RESULT CALLED TO, READ BACK BY AND VERIFIED WITH: Dorise Bullion RN 11/25/17 0324 JDW   Culture, blood (routine x 2)     Status: None (Preliminary result)   Collection Time: 11/27/17  8:11 AM  Result Value Ref Range Status   Specimen Description BLOOD LEFT ANTECUBITAL  Final   Special Requests   Final  BOTTLES DRAWN AEROBIC ONLY Blood Culture results may not be optimal due to an inadequate volume of blood received in culture bottles   Culture   Final    NO GROWTH < 24 HOURS Performed at [place] [place] [address], [place], [place] [address]    Report Status PENDING  Incomplete  Culture, blood (routine x 2)     Status: None (Preliminary result)    Collection Time: 11/27/17  8:19 AM  Result Value Ref Range Status   Specimen Description BLOOD LEFT ARM  Final   Special Requests   Final    BOTTLES DRAWN AEROBIC ONLY Blood Culture results may not be optimal due to an inadequate volume of blood received in culture bottles   Culture   Final    NO GROWTH < 24 HOURS Performed at [facility], [place] [address] [place], [place] [address]    Report Status PENDING  Incomplete     Time coordinating discharge: 37 minutes.   SIGNED:   Elmarie Shiley, MD  [facility] 11/29/2017, 8:25 AM Pager [phone]  If 7PM-7AM, please contact night-coverage [email or web address] Password [email or web address]

## 2017-11-30 ENCOUNTER — Telehealth: Payer: Self-pay

## 2017-11-30 ENCOUNTER — Telehealth: Payer: Self-pay | Admitting: *Deleted

## 2017-11-30 DIAGNOSIS — I10 Essential (primary) hypertension: Secondary | ICD-10-CM

## 2017-11-30 DIAGNOSIS — A419 Sepsis, unspecified organism: Secondary | ICD-10-CM

## 2017-11-30 NOTE — Telephone Encounter (Signed)
"  I am calling regarding my surgery scheduled for July 15.  I just got out of the hospital with Sepsis."  Do you have a date in mind that you would like?  "I'm going to hold off doing it for now.  I have to have a kidney stone removed.  I may be doing it in the next two weeks."  I'll let Dr. Jacqualyn Posey know and cancel it at the surgical center.  "What number do I call to cancel my vein test?"  I'll call  [facility] and cancel your appointment.    I called and canceled the patient's scheduled lower extremity arterial doppler that was scheduled for 12/07/2017 upon her request.

## 2017-11-30 NOTE — Telephone Encounter (Signed)
11/30/17  Transition Care Management Follow-up Telephone Call  ADMISSION DATE: 11/24/17 DISCHARGE DATE: 11/29/17  Patient to have BMP and CBC collected.   How have you been since you were released from the hospital? Tired and Weak   Do you understand why you were in the hospital? Yes   Do you understand the discharge instrcutions? Yes    Items Reviewed:  Medications reviewed: Yes   Allergies reviewed: Yes   Dietary changes reviewed: Low sodium Heart healthy   Referrals reviewed: Hospital follow up appointment scheduled with Elvera Maria NP   Functional Questionnaire:   Activities of Daily Living (ADLs): Patient states she can perform all independently.  Any patient concerns? None at this time.  Confirmed importance and date/time of follow-up visits scheduled:Yes  Confirmed with patient if condition begins to worsen call PCP or go to the ER. Yes   Patient was given the office number and encouragred to call back with questions or concerns.Yes

## 2017-12-02 ENCOUNTER — Telehealth: Payer: Self-pay | Admitting: Cardiology

## 2017-12-02 ENCOUNTER — Other Ambulatory Visit: Payer: Self-pay | Admitting: Urology

## 2017-12-02 DIAGNOSIS — R278 Other lack of coordination: Secondary | ICD-10-CM | POA: Diagnosis not present

## 2017-12-02 DIAGNOSIS — A419 Sepsis, unspecified organism: Secondary | ICD-10-CM | POA: Diagnosis not present

## 2017-12-02 DIAGNOSIS — M6281 Muscle weakness (generalized): Secondary | ICD-10-CM | POA: Diagnosis not present

## 2017-12-02 LAB — CULTURE, BLOOD (ROUTINE X 2)
CULTURE: NO GROWTH
Culture: NO GROWTH

## 2017-12-02 NOTE — Telephone Encounter (Signed)
New Message        [facility] Pre-operative Risk Assessment    Request for surgical clearance:  1. What type of surgery is being performed? Kidney Stone (ureteroscopy)  2. When is this surgery scheduled? TBD   3. What type of clearance is required (medical clearance vs. Pharmacy clearance to hold med vs. Both)? Both 4.  5. Are there any medications that need to be held prior to surgery and how long? Request to hold  Eliquis  6. Practice name and name of physician performing surgery? [facility], Dr. Louis Meckel    7. What is your office phone number [phone]    7.   What is your office fax number [phone]  8.   Anesthesia type (None, local, MAC, general) ? General   Avaletta L Williams 12/02/2017, 2:44 PM  _________________________________________________________________   (provider comments below)

## 2017-12-03 DIAGNOSIS — A419 Sepsis, unspecified organism: Secondary | ICD-10-CM | POA: Diagnosis not present

## 2017-12-03 DIAGNOSIS — M6281 Muscle weakness (generalized): Secondary | ICD-10-CM | POA: Diagnosis not present

## 2017-12-03 DIAGNOSIS — R278 Other lack of coordination: Secondary | ICD-10-CM | POA: Diagnosis not present

## 2017-12-04 ENCOUNTER — Telehealth: Payer: Self-pay | Admitting: *Deleted

## 2017-12-04 NOTE — Telephone Encounter (Signed)
   Primary Cardiologist: Kirk Ruths, MD  Chart reviewed as part of pre-operative protocol coverage. Patient was contacted 12/04/2017 in reference to pre-operative risk assessment for pending surgery as outlined below.  JOANE POSTEL was last seen on 07/01/17 by Dr .  Since that day, ANNAKATE SOULIER has done well with no new cardiac complaints.  Therefore, based on ACC/AHA guidelines, the patient would be at acceptable risk for the planned procedure without further cardiovascular testing.   Based on our pharmacy protocol: Pt takes Eliquis for afib with CHADS2VASc score of 2 (sex, HTN). Renal function is normal. Ok to hold Eliquis for 1-2 days prior to procedure  I will route this recommendation to the requesting party via [place] fax function and remove from pre-op pool.  Please call with questions.  Daune Perch, NP 12/04/2017, 10:11 AM

## 2017-12-04 NOTE — Telephone Encounter (Signed)
Received FMLA/STD paperwork from [facility], completed as much as possible; forwarded to provider, as patient has Hospital F/U appointment on Mon, 0/15/19 at 10:40am with /SLS [id number]

## 2017-12-04 NOTE — Telephone Encounter (Signed)
Pt takes Eliquis for afib with CHADS2VASc score of 2 (sex, HTN). Renal function is normal. Ok to hold Eliquis for 1-2 days prior to procedure. 

## 2017-12-07 ENCOUNTER — Telehealth: Payer: Self-pay | Admitting: Family

## 2017-12-07 ENCOUNTER — Encounter: Payer: Self-pay | Admitting: Family

## 2017-12-07 ENCOUNTER — Ambulatory Visit: Payer: 59 | Admitting: Family

## 2017-12-07 ENCOUNTER — Inpatient Hospital Stay (HOSPITAL_COMMUNITY): Admission: RE | Admit: 2017-12-07 | Payer: 59 | Source: Ambulatory Visit

## 2017-12-07 VITALS — BP 128/90 | HR 93 | Temp 98.2°F | Resp 18 | Ht 64.0 in | Wt 318.6 lb

## 2017-12-07 DIAGNOSIS — A4151 Sepsis due to Escherichia coli [E. coli]: Secondary | ICD-10-CM

## 2017-12-07 DIAGNOSIS — I10 Essential (primary) hypertension: Secondary | ICD-10-CM

## 2017-12-07 DIAGNOSIS — R21 Rash and other nonspecific skin eruption: Secondary | ICD-10-CM

## 2017-12-07 DIAGNOSIS — I5032 Chronic diastolic (congestive) heart failure: Secondary | ICD-10-CM

## 2017-12-07 DIAGNOSIS — N202 Calculus of kidney with calculus of ureter: Secondary | ICD-10-CM | POA: Diagnosis not present

## 2017-12-07 LAB — CBC WITH DIFFERENTIAL/PLATELET
BASOS ABS: 0.2 10*3/uL — AB (ref 0.0–0.1)
BASOS PCT: 1 % (ref 0.0–3.0)
Eosinophils Absolute: 0.2 10*3/uL (ref 0.0–0.7)
Eosinophils Relative: 1.4 % (ref 0.0–5.0)
HCT: 46.1 % — ABNORMAL HIGH (ref 36.0–46.0)
HEMOGLOBIN: 15.1 g/dL — AB (ref 12.0–15.0)
LYMPHS PCT: 8.4 % — AB (ref 12.0–46.0)
Lymphs Abs: 1.3 10*3/uL (ref 0.7–4.0)
MCHC: 32.8 g/dL (ref 30.0–36.0)
MCV: 87.6 fl (ref 78.0–100.0)
Monocytes Absolute: 0.8 10*3/uL (ref 0.1–1.0)
Monocytes Relative: 5 % (ref 3.0–12.0)
NEUTROS ABS: 12.9 10*3/uL — AB (ref 1.4–7.7)
Neutrophils Relative %: 84.2 % — ABNORMAL HIGH (ref 43.0–77.0)
RBC: 5.26 Mil/uL — ABNORMAL HIGH (ref 3.87–5.11)
RDW: 16.1 % — ABNORMAL HIGH (ref 11.5–15.5)
WBC: 15.3 10*3/uL — AB (ref 4.0–10.5)

## 2017-12-07 LAB — COMPREHENSIVE METABOLIC PANEL
ALBUMIN: 3.8 g/dL (ref 3.5–5.2)
ALT: 41 U/L — ABNORMAL HIGH (ref 0–35)
AST: 21 U/L (ref 0–37)
Alkaline Phosphatase: 103 U/L (ref 39–117)
BUN: 17 mg/dL (ref 6–23)
CALCIUM: 8.4 mg/dL (ref 8.4–10.5)
CHLORIDE: 96 meq/L (ref 96–112)
CO2: 33 mEq/L — ABNORMAL HIGH (ref 19–32)
Creatinine, Ser: 1.04 mg/dL (ref 0.40–1.20)
GFR: 57.34 mL/min — ABNORMAL LOW (ref >60.00)
Glucose, Bld: 103 mg/dL — ABNORMAL HIGH (ref 70–99)
POTASSIUM: 3.6 meq/L (ref 3.5–5.1)
Sodium: 140 mEq/L (ref 135–145)
Total Bilirubin: 0.5 mg/dL (ref 0.2–1.2)
Total Protein: 6.7 g/dL (ref 6.0–8.3)

## 2017-12-07 NOTE — Patient Instructions (Addendum)
Continue to weigh daily. Complete lab work prior to leaving. Keep your upcoming appointment with Urology and dermatology.  Complete cipro. Call if rash worsens or if it fails to improve in the next few weeks.

## 2017-12-07 NOTE — Progress Notes (Signed)
Subjective:    Patient ID: [name], female    DOB: 06/24/1955, 62 y.o.   MRN: [id number]  HPI  Patient is a 62 yr old female who presents today for hospital follow up. Discharge summary is reviewed.  She was admitted on July 2 through November 29, 2017.  She presented with acute onset of altered mental status and fever at home.  Prior to that she had been noting progressive weakness.  In the emergency department a code sepsis was called and the patient received IV antibiotics and IV fluids.  She was found to have urinary tract infection, as well as a right ureteral stone/  E. coli bacteremia/enterobacteriaceae bacteremia.  During her hospitalization her Eliquis was held due to severe thrombocytopenia.  Eliquis was resumed at time of discharge with plan for close outpatient follow-up with her hematologist.  She was treated for oral thrush with fluconazole.  Weight was 310. She is  taking 2 tabs lasixin AM and 1 in PM.  Wt Readings from Last 3 Encounters:  12/07/17 (!) 318 lb 9.6 oz (144.5 kg)  11/28/17 (!) 340 lb 9.6 oz (154.5 kg)  10/14/17 (!) 324 lb (147 kg)   She notes that she continues to have a rash on both of her palms and her back.  Rash on the palms was present prior to her hospitalization.     Review of Systems See HPI  Past Medical History:  Diagnosis Date  . Abnormal Pap smear    years ago/no biopsy  . Allergic rhinitis   . Anemia, iron deficiency   . Arthritis    back- lower  . Asthma   . Atrial fibrillation ([place]) August 2012   OFF XARELTO LAST MONTH DUE TO BLEEDING IN STOOL  . Bursitis of hip left  . Dysrhythmia    afib, followed by Dr. Stanford Breed   . Edema of both legs   . Fatty liver 01/07/2012  . Fibroid 1974   fibroid cyst on left fallopian tube  . Fibromyalgia   . Foot ulcer ([place])    AREA HEALED RIGHT FOOT  . GERD (gastroesophageal reflux disease)   . Headache(784.0)    occasional sinus headache   . History of blood transfusion JULY 2015  .  Hypertension   . Hypoparathyroidism ([place]) 01/07/2011  . Hypothyroidism   . Kidney stone 10-08-15   passed on their own  . Morbid obesity ([place])   . Nephrolithiasis 2/16, 9/16   SEES DR Risa Grill  . Pernicious anemia 02/20/2014   followed by Debbrah Alar  . PONV (postoperative nausea and vomiting)   . Seizures ([place])    infancy secondary to fever  . Thyroid cancer ([place]) 2011   THYROIDECTOMY DONE  . Uterine cancer ([place]) 2014   Mirena IUD  . Vitamin D deficiency      Social History   Socioeconomic History  . Marital status: Single    Spouse name: Not on file  . Number of children: 0  . Years of education: Not on file  . Highest education level: Not on file  Occupational History  . Occupation: works in Insurance claims handler  . Occupation: DESIGN COMPUTER CHIP    Employer: [employer]  Social Needs  . Financial resource strain: Not on file  . Food insecurity:    Worry: Not on file    Inability: Not on file  . Transportation needs:    Medical: Not on file    Non-medical: Not on file  Tobacco Use  .  Smoking status: Former Smoker    Packs/day: 0.50    Years: 25.00    Pack years: 12.50    Types: Cigarettes    Start date: 12/24/1970    Last attempt to quit: 05/27/1995    Years since quitting: 22.5  . Smokeless tobacco: Never Used  . Tobacco comment: quit smoking 19 years ago  Substance and Sexual Activity  . Alcohol use: Yes    Alcohol/week: 0.0 oz    Comment: 1/2 glass per month  . Drug use: No  . Sexual activity: Yes    Partners: Male    Birth control/protection: Post-menopausal  Lifestyle  . Physical activity:    Days per week: Not on file    Minutes per session: Not on file  . Stress: Not on file  Relationships  . Social connections:    Talks on phone: Not on file    Gets together: Not on file    Attends religious service: Not on file    Active member of club or organization: Not on file    Attends meetings of clubs or organizations: Not on file     Relationship status: Not on file  . Intimate partner violence:    Fear of current or ex partner: Not on file    Emotionally abused: Not on file    Physically abused: Not on file    Forced sexual activity: Not on file  Other Topics Concern  . Not on file  Social History Narrative   Occupation: works in Insurance claims handler - Field seismologist   Single       Former Smoker - quit tobacco 12 years ago.  She was light smoker for 10 years.                 Past Surgical History:  Procedure Laterality Date  . AMPUTATION Right 05/18/2014   Procedure: RIGHT FIFTH RAY AMPUTATION FOOT;  Surgeon: Wylene Simmer, MD;  Location: [facility];  Service: Orthopedics;  Laterality: Right;  . APPENDECTOMY    . BUNIONECTOMY     bilateral  . COLONOSCOPY WITH PROPOFOL N/A 02/02/2014   Procedure: COLONOSCOPY WITH PROPOFOL;  Surgeon: Irene Shipper, MD;  Location: [facility];  Service: Endoscopy;  Laterality: N/A;  . cyst on ovary removed     . CYSTOSCOPY W/ RETROGRADES  03/03/2012   Procedure: CYSTOSCOPY WITH RETROGRADE PYELOGRAM;  Surgeon: Bernestine Amass, MD;  Location: [facility];  Service: Urology;  Laterality: Bilateral;  . CYSTOSCOPY W/ URETERAL STENT PLACEMENT Right 11/25/2017   Procedure: CYSTOSCOPY WITH RETROGRADE RIGHT URETERAL STENT PLACEMENT;  Surgeon: Franchot Gallo, MD;  Location: [facility];  Service: Urology;  Laterality: Right;  . CYSTOSCOPY WITH RETROGRADE PYELOGRAM, URETEROSCOPY AND STENT PLACEMENT Left 08/25/2012   Procedure: CYSTOSCOPY WITH RETROGRADE PYELOGRAM, URETEROSCOPY;  Surgeon: Bernestine Amass, MD;  Location[facility];  Service: Urology;  Laterality: Left;  . DILATION AND CURETTAGE OF UTERUS N/A 02/21/2013   Procedure: DILATATION AND CURETTAGE;  Surgeon: Lyman Speller, MD;  Location: [facility];  Service: Gynecology;  Laterality: N/A;  . DILATION AND CURETTAGE OF UTERUS N/A 04/09/2015   Procedure: [place] IUD removal;  Surgeon: Megan Salon, MD;  Location: [facility];  Service:  Gynecology;  Laterality: N/A;  Patient weight 307lbs  . ESOPHAGOGASTRODUODENOSCOPY (EGD) WITH PROPOFOL N/A 02/02/2014   Procedure: ESOPHAGOGASTRODUODENOSCOPY (EGD) WITH PROPOFOL;  Surgeon: Irene Shipper, MD;  [facility];  Service: Endoscopy;  Laterality: N/A;  . GASTRIC BYPASS  1974  .  HOLMIUM LASER APPLICATION Left 09/26/84   Procedure: HOLMIUM LASER APPLICATION;  Surgeon: Bernestine Amass, MD;  [facility];  Service: Urology;  Laterality: Left;  . LITHOTRIPSY  03/2012  . LITHOTRIPSY  2/16  . THYROIDECTOMY  05/15/2010  . TONSILLECTOMY AND ADENOIDECTOMY    . URETEROSCOPY  03/03/2012   Procedure: URETEROSCOPY;  Surgeon: Bernestine Amass, MD;  [facility];  Service: Urology;  Laterality: Left;    Family History  Problem Relation Age of Onset  . Hypertension Father   . Diabetes Father   . Lung cancer Father   . Heart attack Father        MI at age 69  . Hypertension Mother   . Hyperthyroidism Mother   . Heart disease Mother   . Heart attack Mother        MI at age 80  . Asthma Brother   . Hypertension Brother        younger  . Heart disease Brother        older  . Colon cancer Neg Hx   . Esophageal cancer Neg Hx   . Stomach cancer Neg Hx   . Kidney disease Neg Hx   . Liver disease Neg Hx     Allergies  Allergen Reactions  . Other Anaphylaxis    NUTS  . Ciprofloxacin     Rash nausea  . Penicillins Other (See Comments)    Headache. Has patient had a PCN reaction causing immediate rash, facial/tongue/throat swelling, SOB or lightheadedness with hypotension: No Has patient had a PCN reaction causing severe rash involving mucus membranes or skin necrosis: No Has patient had a PCN reaction that required hospitalization: No Has patient had a PCN reaction occurring within the last 10 years: Yes If all of the above answers are "NO", then may proceed with Cephalosporin use.   [name] Amoxicillin-Pot Clavulanate Other (See Comments)    headache  . Food Hives    Potato   . Tomato Hives    Current Outpatient Medications on File Prior to Visit  Medication Sig Dispense Refill  . acetaminophen (TYLENOL) 325 MG tablet Take 2 tablets (650 mg total) by mouth every 6 (six) hours as needed for mild pain or moderate pain. 60 tablet 0  . albuterol (PROVENTIL HFA;VENTOLIN HFA) 108 (90 Base) MCG/ACT inhaler Inhale 2 puffs into the lungs every 6 (six) hours as needed for wheezing or shortness of breath. 1 Inhaler 5  . apixaban (ELIQUIS) 5 MG TABS tablet Take 5 mg by mouth 2 (two) times daily.    . calcitRIOL (ROCALTROL) 0.5 MCG capsule Take 2 mcg by mouth daily.     . calcium carbonate (TUMS - DOSED IN MG ELEMENTAL CALCIUM) 500 MG chewable tablet Chew 2 tablets (400 mg of elemental calcium total) by mouth 4 (four) times daily.    . citric acid-potassium citrate (POLYCITRA) 1100-334 MG/5ML solution Take 10 mEq by mouth daily.    . Cyanocobalamin (VITAMIN B-12 IJ) Inject as directed every 30 (thirty) days.    [name] desoximetasone (TOPICORT) 0.05 % cream Apply topically 2 (two) times daily as needed. 60 g 0  . diltiazem (CARDIZEM CD) 240 MG 24 hr capsule TAKE 1 CAPSULE BY MOUTH  DAILY 90 capsule 0  . diphenhydrAMINE (BENADRYL) 25 mg capsule Take 25 mg by mouth every 8 (eight) hours as needed for itching.    . fluticasone (FLONASE) 50 MCG/ACT nasal spray Place 2 sprays into both nostrils daily as needed for allergies or  rhinitis.     . furosemide (LASIX) 40 MG tablet 2 tabs by mouth daily in the AM and 1 tablet in the PM (Patient taking differently: Take 40-80 mg by mouth See admin instructions. 80mg  in the morning and 40mg  at night) 270 tablet 1  . levocetirizine (XYZAL) 5 MG tablet TAKE 1 TABLET BY MOUTH  EVERY EVENING 90 tablet 3  . levothyroxine (SYNTHROID, LEVOTHROID) 100 MCG tablet Take 300 mcg by mouth daily.     [name] neomycin-bacitracin-polymyxin (NEOSPORIN) ointment Apply 1 application topically as needed for wound care. apply to eye    . nystatin (MYCOSTATIN/NYSTOP) powder  Apply topically 3 (three) times daily. 15 g 0  . omeprazole (PRILOSEC) 40 MG capsule TAKE 1 CAPSULE BY MOUTH  DAILY 90 capsule 1  . saccharomyces boulardii (FLORASTOR) 250 MG capsule Take 1 capsule (250 mg total) by mouth 2 (two) times daily. 30 capsule 0  . SYMBICORT 160-4.5 MCG/ACT inhaler INHALE 2 PUFFS TWO TIMES  DAILY 30.6 g 1  . traMADol (ULTRAM) 50 MG tablet Take 1 tablet (50 mg total) by mouth every 12 (twelve) hours as needed. 15 tablet 0  . Vitamin D, Ergocalciferol, (DRISDOL) 50000 units CAPS capsule TAKE 1 CAPSULE BY MOUTH 3  TIMES WEEKLY 39 capsule 1  . zafirlukast (ACCOLATE) 20 MG tablet TAKE 1 TABLET BY MOUTH TWO  TIMES DAILY BEFORE MEALS 180 tablet 1   No current facility-administered medications on file prior to visit.     BP 128/90 (BP Location: Left Arm, Cuff Size: Large)   Pulse 93   Temp 98.2 F (36.8 C) (Oral)   Resp 18   Ht 5\' 4"  (1.626 m)   Wt (!) 318 lb 9.6 oz (144.5 kg)   LMP 06/11/2012   SpO2 94%   BMI 54.69 kg/m       Objective:   Physical Exam  Constitutional: She is oriented to person, place, and time. She appears well-developed and well-nourished.  Cardiovascular: Normal rate, regular rhythm and normal heart sounds.  No murmur heard. Pulmonary/Chest: Effort normal and breath sounds normal. No respiratory distress. She has no wheezes.  Musculoskeletal:  2+ bilateral LE edema  Neurological: She is alert and oriented to person, place, and time.  Psychiatric: She has a normal mood and affect. Her behavior is normal. Judgment and thought content normal.          Assessment & Plan:  Sepsis/UT she has additional 4 days of CiproI/Nephrolithiasis-scheduled.  I am not convinced that the progression of her back or hands is related to Cipro.  Therefore we will continue Cipro to complete an additional 4 days.  She is scheduled for follow-up with urology as well as a procedure for stone removal.  Clinically she is much better.  Will obtain follow-up renal  function, CMET and CBC.  Skin rash-refer to dermatology for further evaluation.  Chronic diastolic heart failure- she has gained 8 pounds since returning home however she is significantly below her max weight during her hospitalization of 340.  I have advised her to continue to weigh daily.  Call if she gains 3 or more pounds from her current weight.  Continue current dose of Lasix.  If she has further weight gain would plan to change Lasix from 2 tabs in a.m. and 1 tab in p.m. to 2 tabs p.o. twice daily.  HTN- Blood pressure remains stable on the losartan.  Advised patient to continue off of losartan for now. Can resume at a later date if blood  pressure starts running high.

## 2017-12-07 NOTE — Telephone Encounter (Signed)
Could you please notify patient in that her wbc is elevated.  We will notify urology in AM in case they want to move her procedure up sooner. Would you please call Dr. Louis Meckel tomorrow and ask him to call me on my cell?

## 2017-12-08 DIAGNOSIS — I10 Essential (primary) hypertension: Secondary | ICD-10-CM | POA: Diagnosis not present

## 2017-12-08 DIAGNOSIS — N390 Urinary tract infection, site not specified: Secondary | ICD-10-CM | POA: Diagnosis not present

## 2017-12-08 DIAGNOSIS — A4151 Sepsis due to Escherichia coli [E. coli]: Secondary | ICD-10-CM | POA: Diagnosis not present

## 2017-12-08 NOTE — Telephone Encounter (Signed)
Spoke with [facility] and provided PCP's contact info. For return call from Dr Louis Meckel. Pt has been notified of below.

## 2017-12-09 ENCOUNTER — Telehealth: Payer: Self-pay | Admitting: Family

## 2017-12-09 NOTE — Telephone Encounter (Signed)
Late entry- I reviewed with pt's urologist her elevated WBC yesterday AM.  He stated that she was seen in their office this week by their APP and had a culture pending.  He stated that they will wait for culture data and if + will plan to continue abx longer that the 4 additional planned days of cipro.  He advised that if patient worsens clinically she may need to have her procedure completed sooner. States that they will follow up with the patient re: culture results and further recs.  Gilmore Laroche- would you please contact the patient and let her know that her white blood cell count is back up some. Dr. Louis Meckel is aware. If she develops pain, fever, confusion I would recommend that she go to the ER. She should complete cipro.  Dr. Shaune Leeks office should let her know if her urine culture performed at their office grows anything. If so they will likely proceed with sooner procedure and additional antibiotics.

## 2017-12-09 NOTE — Telephone Encounter (Signed)
Spoke with Cecille Rubin and advised her dose is 1049mcg, IM once a month.

## 2017-12-09 NOTE — Telephone Encounter (Signed)
Copied from [place] [id number]. Topic: General - Other >> Dec 09, 2017 10:58 AM Carolyn Stare wrote:  Cecille Rubin with [facility] call to verify what pt B12 dosage is   [phone]

## 2017-12-10 NOTE — Telephone Encounter (Signed)
Notified pt and she voices understanding. 

## 2017-12-11 DIAGNOSIS — N390 Urinary tract infection, site not specified: Secondary | ICD-10-CM | POA: Diagnosis not present

## 2017-12-11 DIAGNOSIS — A4151 Sepsis due to Escherichia coli [E. coli]: Secondary | ICD-10-CM | POA: Diagnosis not present

## 2017-12-11 DIAGNOSIS — I10 Essential (primary) hypertension: Secondary | ICD-10-CM | POA: Diagnosis not present

## 2017-12-15 DIAGNOSIS — N390 Urinary tract infection, site not specified: Secondary | ICD-10-CM | POA: Diagnosis not present

## 2017-12-15 DIAGNOSIS — I10 Essential (primary) hypertension: Secondary | ICD-10-CM | POA: Diagnosis not present

## 2017-12-15 DIAGNOSIS — A4151 Sepsis due to Escherichia coli [E. coli]: Secondary | ICD-10-CM | POA: Diagnosis not present

## 2017-12-16 ENCOUNTER — Telehealth: Payer: Self-pay | Admitting: *Deleted

## 2017-12-16 NOTE — Telephone Encounter (Signed)
Received Physician Orders from [facility]; forwarded to provider/SLS 07/24

## 2017-12-17 ENCOUNTER — Ambulatory Visit (INDEPENDENT_AMBULATORY_CARE_PROVIDER_SITE_OTHER): Payer: 59

## 2017-12-17 DIAGNOSIS — A4151 Sepsis due to Escherichia coli [E. coli]: Secondary | ICD-10-CM | POA: Diagnosis not present

## 2017-12-17 DIAGNOSIS — N390 Urinary tract infection, site not specified: Secondary | ICD-10-CM | POA: Diagnosis not present

## 2017-12-17 DIAGNOSIS — E538 Deficiency of other specified B group vitamins: Secondary | ICD-10-CM | POA: Diagnosis not present

## 2017-12-17 DIAGNOSIS — I10 Essential (primary) hypertension: Secondary | ICD-10-CM | POA: Diagnosis not present

## 2017-12-17 MED ORDER — CYANOCOBALAMIN 1000 MCG/ML IJ SOLN
1000.0000 ug | Freq: Once | INTRAMUSCULAR | Status: AC
  Administered 2017-12-17: 1000 ug via INTRAMUSCULAR

## 2017-12-17 NOTE — Progress Notes (Signed)
Pre visit review using our clinic tool,if applicable. No additional management support is needed unless otherwise documented below in the visit note.   Pt here for monthly B12 injection per order from Debbrah Alar NP.  B12 1036mcg given IM left deltoid, and pt tolerated injection well.  No complaints voiced this visit.  Next B12 injection scheduled for August 2019. Patient aware.

## 2017-12-18 ENCOUNTER — Encounter (HOSPITAL_COMMUNITY): Payer: Self-pay

## 2017-12-18 DIAGNOSIS — N390 Urinary tract infection, site not specified: Secondary | ICD-10-CM | POA: Diagnosis not present

## 2017-12-18 DIAGNOSIS — A4151 Sepsis due to Escherichia coli [E. coli]: Secondary | ICD-10-CM | POA: Diagnosis not present

## 2017-12-18 DIAGNOSIS — I10 Essential (primary) hypertension: Secondary | ICD-10-CM | POA: Diagnosis not present

## 2017-12-18 NOTE — Patient Instructions (Addendum)
Your procedure is scheduled on: Wednesday, December 23, 2017   Surgery Time:  12:30P-2:30P   Report to [facility] Main  Entrance    Report to admitting at 10:30 AM   Call this number if you have problems the morning of surgery [phone]   Do not eat food or drink liquids :After Midnight.   Do NOT smoke after Midnight   Take these medicines the morning of surgery with A SIP OF WATER: Diltiazem, Levothyroxine, Omeprazole, Acculolate, Tramadol if needed, Flonase if needed, Use Asthma Inhalers per normal routine   Bring Asthma Inhaler day of surgery                               You may not have any metal on your body including hair pins, jewelry, and body piercings             Do not wear make-up, lotions, powders, perfumes/cologne, or deodorant             Do not wear nail polish.  Do not shave  48 hours prior to surgery.                Do not bring valuables to the hospital. [place].   Contacts, dentures or bridgework may not be worn into surgery.    Patients discharged the day of surgery will not be allowed to drive home.   Special Instructions: Bring a copy of your healthcare power of attorney and living will documents         the day of surgery if you haven't scanned them in before.              Please read over the following fact sheets you were given:  [facility] - Preparing for Surgery Before surgery, you can play an important role.  Because skin is not sterile, your skin needs to be as free of germs as possible.  You can reduce the number of germs on your skin by washing with CHG (chlorahexidine gluconate) soap before surgery.  CHG is an antiseptic cleaner which kills germs and bonds with the skin to continue killing germs even after washing. Please DO NOT use if you have an allergy to CHG or antibacterial soaps.  If your skin becomes reddened/irritated stop using the CHG and inform your nurse when you arrive  at Short Stay. Do not shave (including legs and underarms) for at least 48 hours prior to the first CHG shower.  You may shave your face/neck.  Please follow these instructions carefully:  1.  Shower with CHG Soap the night before surgery and the  morning of surgery.  2.  If you choose to wash your hair, wash your hair first as usual with your normal  shampoo.  3.  After you shampoo, rinse your hair and body thoroughly to remove the shampoo.                             4.  Use CHG as you would any other liquid soap.  You can apply chg directly to the skin and wash.  Gently with a scrungie or clean washcloth.  5.  Apply the CHG Soap to your body ONLY FROM THE NECK DOWN.   Do  not use on face/ open                           Wound or open sores. Avoid contact with eyes, ears mouth and   genitals (private parts).                       Wash face,  Genitals (private parts) with your normal soap.             6.  Wash thoroughly, paying special attention to the area where your    surgery  will be performed.  7.  Thoroughly rinse your body with warm water from the neck down.  8.  DO NOT shower/wash with your normal soap after using and rinsing off the CHG Soap.                9.  Pat yourself dry with a clean towel.            10.  Wear clean pajamas.            11.  Place clean sheets on your bed the night of your first shower and do not  sleep with pets. Day of Surgery : Do not apply any lotions/deodorants the morning of surgery.  Please wear clean clothes to the hospital/surgery center.  FAILURE TO FOLLOW THESE INSTRUCTIONS MAY RESULT IN THE CANCELLATION OF YOUR SURGERY  PATIENT SIGNATURE_________________________________  NURSE SIGNATURE__________________________________  ________________________________________________________________________

## 2017-12-18 NOTE — Pre-Procedure Instructions (Signed)
The following are in epic: Last office visit note M. Inda Castle, NP 12/07/17 Cardiac Clearance 12/04/17 [place] EKG 11/24/17 CXR 11/24/17 ECHO 04/13/17

## 2017-12-21 ENCOUNTER — Other Ambulatory Visit: Payer: Self-pay

## 2017-12-21 ENCOUNTER — Encounter (HOSPITAL_COMMUNITY): Payer: Self-pay

## 2017-12-21 ENCOUNTER — Encounter (HOSPITAL_COMMUNITY)
Admission: RE | Admit: 2017-12-21 | Discharge: 2017-12-21 | Disposition: A | Discharge status: Home / Self Care | Payer: 59 | Source: Ambulatory Visit | Attending: Urology | Admitting: Urology

## 2017-12-21 DIAGNOSIS — N202 Calculus of kidney with calculus of ureter: Secondary | ICD-10-CM | POA: Diagnosis not present

## 2017-12-21 DIAGNOSIS — Z7901 Long term (current) use of anticoagulants: Secondary | ICD-10-CM | POA: Diagnosis not present

## 2017-12-21 DIAGNOSIS — N201 Calculus of ureter: Secondary | ICD-10-CM | POA: Diagnosis present

## 2017-12-21 DIAGNOSIS — I10 Essential (primary) hypertension: Secondary | ICD-10-CM | POA: Diagnosis not present

## 2017-12-21 DIAGNOSIS — Z87891 Personal history of nicotine dependence: Secondary | ICD-10-CM | POA: Diagnosis not present

## 2017-12-21 DIAGNOSIS — E039 Hypothyroidism, unspecified: Secondary | ICD-10-CM | POA: Diagnosis not present

## 2017-12-21 DIAGNOSIS — Z79890 Hormone replacement therapy (postmenopausal): Secondary | ICD-10-CM | POA: Diagnosis not present

## 2017-12-21 DIAGNOSIS — Z7951 Long term (current) use of inhaled steroids: Secondary | ICD-10-CM | POA: Diagnosis not present

## 2017-12-21 DIAGNOSIS — Z79899 Other long term (current) drug therapy: Secondary | ICD-10-CM | POA: Diagnosis not present

## 2017-12-21 DIAGNOSIS — Z6841 Body Mass Index (BMI) 40.0 and over, adult: Secondary | ICD-10-CM | POA: Diagnosis not present

## 2017-12-21 DIAGNOSIS — I4891 Unspecified atrial fibrillation: Secondary | ICD-10-CM | POA: Diagnosis not present

## 2017-12-21 DIAGNOSIS — K219 Gastro-esophageal reflux disease without esophagitis: Secondary | ICD-10-CM | POA: Diagnosis not present

## 2017-12-21 HISTORY — DX: Deficiency of other specified B group vitamins: E53.8

## 2017-12-21 HISTORY — DX: Rash and other nonspecific skin eruption: R21

## 2017-12-21 HISTORY — DX: Personal history of other infectious and parasitic diseases: Z86.19

## 2017-12-21 HISTORY — DX: Hepatomegaly, not elsewhere classified: R16.0

## 2017-12-21 HISTORY — DX: Atherosclerosis of aorta: I70.0

## 2017-12-21 HISTORY — DX: Chronic diastolic (congestive) heart failure: I50.32

## 2017-12-21 LAB — BASIC METABOLIC PANEL
ANION GAP: 13 (ref 5–15)
BUN: 18 mg/dL (ref 6–20)
CO2: 28 mmol/L (ref 22–32)
Calcium: 7.5 mg/dL — ABNORMAL LOW (ref 8.9–10.3)
Chloride: 100 mmol/L (ref 98–111)
Creatinine, Ser: 0.75 mg/dL (ref 0.44–1.00)
GFR calc non Af Amer: >60 mL/min (ref >60)
Glucose, Bld: 101 mg/dL — ABNORMAL HIGH (ref 70–99)
Potassium: 3.3 mmol/L — ABNORMAL LOW (ref 3.5–5.1)
SODIUM: 141 mmol/L (ref 135–145)

## 2017-12-21 LAB — CBC
HCT: 45.3 % (ref 36.0–46.0)
HEMOGLOBIN: 14.7 g/dL (ref 12.0–15.0)
MCH: 28.5 pg (ref 26.0–34.0)
MCHC: 32.5 g/dL (ref 30.0–36.0)
MCV: 88 fL (ref 78.0–100.0)
PLATELETS: 217 10*3/uL (ref 150–400)
RBC: 5.15 MIL/uL — AB (ref 3.87–5.11)
RDW: 14.6 % (ref 11.5–15.5)
WBC: 15 10*3/uL — AB (ref 4.0–10.5)

## 2017-12-21 LAB — SURGICAL PCR SCREEN
MRSA, PCR: POSITIVE — AB
Staphylococcus aureus: POSITIVE — AB

## 2017-12-21 NOTE — Pre-Procedure Instructions (Signed)
Left message for [name] at Dr. Carlton Adam office about rash on [name]'s back and thighs.

## 2017-12-23 ENCOUNTER — Telehealth (HOSPITAL_COMMUNITY): Payer: Self-pay | Admitting: *Deleted

## 2017-12-23 ENCOUNTER — Ambulatory Visit (HOSPITAL_COMMUNITY): Payer: 59 | Admitting: Anesthesiology

## 2017-12-23 ENCOUNTER — Ambulatory Visit (HOSPITAL_COMMUNITY): Payer: 59

## 2017-12-23 ENCOUNTER — Ambulatory Visit (HOSPITAL_COMMUNITY)
Admission: RE | Admit: 2017-12-23 | Discharge: 2017-12-23 | Disposition: A | Discharge status: Home / Self Care | Payer: 59 | Source: Ambulatory Visit | Attending: Urology | Admitting: Urology

## 2017-12-23 ENCOUNTER — Encounter (HOSPITAL_COMMUNITY)
Admission: RE | Disposition: A | Discharge status: Home / Self Care | Payer: Self-pay | Source: Ambulatory Visit | Attending: Urology

## 2017-12-23 ENCOUNTER — Encounter (HOSPITAL_COMMUNITY): Payer: Self-pay | Admitting: General Practice

## 2017-12-23 ENCOUNTER — Telehealth: Payer: Self-pay | Admitting: *Deleted

## 2017-12-23 DIAGNOSIS — I4891 Unspecified atrial fibrillation: Secondary | ICD-10-CM | POA: Insufficient documentation

## 2017-12-23 DIAGNOSIS — J45909 Unspecified asthma, uncomplicated: Secondary | ICD-10-CM | POA: Diagnosis not present

## 2017-12-23 DIAGNOSIS — N202 Calculus of kidney with calculus of ureter: Secondary | ICD-10-CM | POA: Insufficient documentation

## 2017-12-23 DIAGNOSIS — Z87891 Personal history of nicotine dependence: Secondary | ICD-10-CM | POA: Insufficient documentation

## 2017-12-23 DIAGNOSIS — Z7951 Long term (current) use of inhaled steroids: Secondary | ICD-10-CM | POA: Insufficient documentation

## 2017-12-23 DIAGNOSIS — E039 Hypothyroidism, unspecified: Secondary | ICD-10-CM | POA: Insufficient documentation

## 2017-12-23 DIAGNOSIS — N201 Calculus of ureter: Secondary | ICD-10-CM | POA: Diagnosis not present

## 2017-12-23 DIAGNOSIS — N200 Calculus of kidney: Secondary | ICD-10-CM

## 2017-12-23 DIAGNOSIS — I10 Essential (primary) hypertension: Secondary | ICD-10-CM | POA: Insufficient documentation

## 2017-12-23 DIAGNOSIS — K219 Gastro-esophageal reflux disease without esophagitis: Secondary | ICD-10-CM | POA: Insufficient documentation

## 2017-12-23 DIAGNOSIS — Z7901 Long term (current) use of anticoagulants: Secondary | ICD-10-CM | POA: Insufficient documentation

## 2017-12-23 DIAGNOSIS — Z6841 Body Mass Index (BMI) 40.0 and over, adult: Secondary | ICD-10-CM | POA: Insufficient documentation

## 2017-12-23 DIAGNOSIS — Z79890 Hormone replacement therapy (postmenopausal): Secondary | ICD-10-CM | POA: Insufficient documentation

## 2017-12-23 DIAGNOSIS — Z79899 Other long term (current) drug therapy: Secondary | ICD-10-CM | POA: Insufficient documentation

## 2017-12-23 HISTORY — PX: CYSTOSCOPY/URETEROSCOPY/HOLMIUM LASER/STENT PLACEMENT: SHX6546

## 2017-12-23 SURGERY — CYSTOSCOPY/URETEROSCOPY/HOLMIUM LASER/STENT PLACEMENT
Anesthesia: General | Laterality: Right

## 2017-12-23 MED ORDER — MIDAZOLAM HCL 2 MG/2ML IJ SOLN
INTRAMUSCULAR | Status: AC
  Filled 2017-12-23: qty 2

## 2017-12-23 MED ORDER — DEXAMETHASONE SODIUM PHOSPHATE 10 MG/ML IJ SOLN
INTRAMUSCULAR | Status: DC | PRN
  Administered 2017-12-23: 10 mg via INTRAVENOUS

## 2017-12-23 MED ORDER — PROPOFOL 10 MG/ML IV BOLUS
INTRAVENOUS | Status: DC | PRN
  Administered 2017-12-23: 250 mg via INTRAVENOUS

## 2017-12-23 MED ORDER — SCOPOLAMINE 1 MG/3DAYS TD PT72
1.0000 | MEDICATED_PATCH | TRANSDERMAL | Status: DC
  Administered 2017-12-23: 1.5 mg via TRANSDERMAL
  Filled 2017-12-23: qty 1

## 2017-12-23 MED ORDER — LIDOCAINE 2% (20 MG/ML) 5 ML SYRINGE
INTRAMUSCULAR | Status: DC | PRN
  Administered 2017-12-23: 100 mg via INTRAVENOUS

## 2017-12-23 MED ORDER — HYDROMORPHONE HCL 1 MG/ML IJ SOLN: 0.2500 mg | INTRAMUSCULAR | Status: DC | PRN

## 2017-12-23 MED ORDER — ESMOLOL HCL 100 MG/10ML IV SOLN
INTRAVENOUS | Status: AC
  Filled 2017-12-23: qty 10

## 2017-12-23 MED ORDER — OXYCODONE HCL 5 MG PO TABS
5.0000 mg | ORAL_TABLET | Freq: Once | ORAL | Status: AC | PRN
  Administered 2017-12-23: 5 mg via ORAL

## 2017-12-23 MED ORDER — ONDANSETRON HCL 4 MG/2ML IJ SOLN
INTRAMUSCULAR | Status: DC | PRN
  Administered 2017-12-23: 4 mg via INTRAVENOUS

## 2017-12-23 MED ORDER — PROMETHAZINE HCL 25 MG/ML IJ SOLN: 6.2500 mg | INTRAMUSCULAR | Status: DC | PRN

## 2017-12-23 MED ORDER — SODIUM CHLORIDE 0.9 % IR SOLN
Status: DC | PRN
  Administered 2017-12-23: 6000 mL

## 2017-12-23 MED ORDER — ESMOLOL HCL 100 MG/10ML IV SOLN
INTRAVENOUS | Status: DC | PRN
  Administered 2017-12-23: 30 mg via INTRAVENOUS

## 2017-12-23 MED ORDER — FENTANYL CITRATE (PF) 100 MCG/2ML IJ SOLN
INTRAMUSCULAR | Status: DC | PRN
  Administered 2017-12-23 (×4): 50 ug via INTRAVENOUS

## 2017-12-23 MED ORDER — PROPOFOL 10 MG/ML IV BOLUS
INTRAVENOUS | Status: AC
  Filled 2017-12-23: qty 40

## 2017-12-23 MED ORDER — PHENAZOPYRIDINE HCL 200 MG PO TABS: 200.0000 mg | ORAL_TABLET | Freq: Three times a day (TID) | ORAL | 0 refills | Status: DC | PRN

## 2017-12-23 MED ORDER — IOHEXOL 300 MG/ML  SOLN
INTRAMUSCULAR | Status: DC | PRN
  Administered 2017-12-23: 27 mL via INTRAVENOUS

## 2017-12-23 MED ORDER — MIDAZOLAM HCL 5 MG/5ML IJ SOLN
INTRAMUSCULAR | Status: DC | PRN
  Administered 2017-12-23: 2 mg via INTRAVENOUS

## 2017-12-23 MED ORDER — OXYCODONE HCL 5 MG PO TABS
ORAL_TABLET | ORAL | Status: AC
  Administered 2017-12-23: 5 mg via ORAL
  Filled 2017-12-23: qty 1

## 2017-12-23 MED ORDER — OXYCODONE HCL 5 MG/5ML PO SOLN
5.0000 mg | Freq: Once | ORAL | Status: AC | PRN
  Filled 2017-12-23: qty 5

## 2017-12-23 MED ORDER — LACTATED RINGERS IV SOLN
INTRAVENOUS | Status: DC
  Administered 2017-12-23: 12:00:00 via INTRAVENOUS

## 2017-12-23 MED ORDER — SUCCINYLCHOLINE CHLORIDE 200 MG/10ML IV SOSY
PREFILLED_SYRINGE | INTRAVENOUS | Status: AC
  Filled 2017-12-23: qty 10

## 2017-12-23 MED ORDER — SUCCINYLCHOLINE CHLORIDE 200 MG/10ML IV SOSY
PREFILLED_SYRINGE | INTRAVENOUS | Status: DC | PRN
  Administered 2017-12-23: 140 mg via INTRAVENOUS

## 2017-12-23 MED ORDER — HYDROCODONE-ACETAMINOPHEN 5-325 MG PO TABS: 1.0000 | ORAL_TABLET | Freq: Four times a day (QID) | ORAL | 0 refills | Status: DC | PRN

## 2017-12-23 MED ORDER — FENTANYL CITRATE (PF) 100 MCG/2ML IJ SOLN
INTRAMUSCULAR | Status: AC
  Filled 2017-12-23: qty 2

## 2017-12-23 MED ORDER — GENTAMICIN SULFATE 40 MG/ML IJ SOLN
5.0000 mg/kg | Freq: Once | INTRAVENOUS | Status: AC
  Administered 2017-12-23: 460 mg via INTRAVENOUS
  Filled 2017-12-23: qty 11.5

## 2017-12-23 MED ORDER — LIDOCAINE 2% (20 MG/ML) 5 ML SYRINGE
INTRAMUSCULAR | Status: AC
  Filled 2017-12-23: qty 5

## 2017-12-23 MED ORDER — BELLADONNA ALKALOIDS-OPIUM 16.2-30 MG RE SUPP
RECTAL | Status: AC
  Filled 2017-12-23: qty 1

## 2017-12-23 MED FILL — HYDROCODON-APAP 5-325: 5-325 | 2 days supply | Qty: 15 | Fill #0

## 2017-12-23 MED FILL — PHENAZOPYRIDINE 200 MG TAB: 200 | 7 days supply | Qty: 20 | Fill #0

## 2017-12-23 SURGICAL SUPPLY — 24 items
BAG URO CATCHER STRL LF (MISCELLANEOUS) ×2 IMPLANT
BASKET STONE NCOMPASS (UROLOGICAL SUPPLIES) ×1 IMPLANT
BASKET ZERO TIP NITINOL 2.4FR (BASKET) ×1 IMPLANT
BSKT STON RTRVL ZERO TP 2.4FR (BASKET) ×1
CATH URET 5FR 28IN OPEN ENDED (CATHETERS) ×4 IMPLANT
CATH URET DUAL LUMEN 6-10FR 50 (CATHETERS) ×1 IMPLANT
CLOTH BEACON ORANGE TIMEOUT ST (SAFETY) ×2 IMPLANT
COVER FOOTSWITCH UNIV (MISCELLANEOUS) IMPLANT
EXTRACTOR STONE 1.7FRX115CM (UROLOGICAL SUPPLIES) IMPLANT
EXTRACTOR STONE NITINOL NGAGE (UROLOGICAL SUPPLIES) ×2 IMPLANT
FIBER LASER TRAC TIP (UROLOGICAL SUPPLIES) ×1 IMPLANT
GLOVE BIOGEL M STRL SZ7.5 (GLOVE) ×2 IMPLANT
GOWN STRL REUS W/TWL XL LVL3 (GOWN DISPOSABLE) ×2 IMPLANT
GUIDEWIRE ANG ZIPWIRE 038X150 (WIRE) IMPLANT
GUIDEWIRE STR DUAL SENSOR (WIRE) ×3 IMPLANT
MANIFOLD NEPTUNE II (INSTRUMENTS) ×2 IMPLANT
PACK CYSTO (CUSTOM PROCEDURE TRAY) ×2 IMPLANT
SHEATH URETERAL 12FRX28CM (UROLOGICAL SUPPLIES) IMPLANT
SHEATH URETERAL 12FRX35CM (MISCELLANEOUS) ×1 IMPLANT
STENT URET 6FRX24 CONTOUR (STENTS) ×1 IMPLANT
TUBING CONNECTING 10 (TUBING) ×2 IMPLANT
TUBING UROLOGY SET (TUBING) ×2 IMPLANT
WIRE COONS/BENSON .038X145CM (WIRE) IMPLANT
YANKAUER SUCT BULB TIP 10FT TU (MISCELLANEOUS) ×1 IMPLANT

## 2017-12-23 NOTE — Transfer of Care (Signed)
Immediate Anesthesia Transfer of Care Note  Patient: [name]  Procedure(s) Performed: RIGHT URETEROSCOPY STONE REMOVAL, HOLMIUM LASER, RIGHT /STENT EXCHANGE (Right )  Patient Location: PACU  Anesthesia Type:General  Level of Consciousness: awake, alert , oriented and patient cooperative  Airway & Oxygen Therapy: Patient Spontanous Breathing and Patient connected to face mask oxygen  Post-op Assessment: Report given to RN and Post -op Vital signs reviewed and stable  Post vital signs: Reviewed and stable  Last Vitals:  Vitals Value Taken Time  BP 121/70 12/23/2017  3:08 PM  Temp 36.6 C 12/23/2017  3:08 PM  Pulse 77 12/23/2017  3:10 PM  Resp 8 12/23/2017  3:10 PM  SpO2 98 % 12/23/2017  3:10 PM  Vitals shown include unvalidated device data.  Last Pain:  Vitals:   12/23/17 1508  TempSrc:   PainSc: (P) 0-No pain         Complications: No apparent anesthesia complications

## 2017-12-23 NOTE — Anesthesia Preprocedure Evaluation (Addendum)
Anesthesia Evaluation  Patient identified by MRN, date of birth, ID band Patient awake    Reviewed: Allergy & Precautions, NPO status , Patient's Chart, lab work & pertinent test results  History of Anesthesia Complications (+) PONV and history of anesthetic complications  Airway Mallampati: II  TM Distance: >3 FB Neck ROM: Full    Dental no notable dental hx.    Pulmonary asthma , former smoker,    Pulmonary exam normal breath sounds clear to auscultation       Cardiovascular hypertension, Pt. on medications Normal cardiovascular exam+ dysrhythmias Atrial Fibrillation  Rhythm:Regular Rate:Normal  ECG: ST, rate 114   Neuro/Psych  Headaches, Seizures -, Well Controlled,  negative psych ROS   GI/Hepatic Neg liver ROS, GERD  Medicated and Controlled,  Endo/Other  Hypothyroidism Morbid obesity  Renal/GU negative Renal ROS     Musculoskeletal  (+) Fibromyalgia -  Abdominal (+) + obese,   Peds  Hematology negative hematology ROS (+)   Anesthesia Other Findings RIGHT URETERAL STONE Super obese  Reproductive/Obstetrics                            Anesthesia Physical Anesthesia Plan  ASA: IV  Anesthesia Plan: General   Post-op Pain Management:    Induction: Intravenous  PONV Risk Score and Plan: 4 or greater and Ondansetron, Dexamethasone, Midazolam and Scopolamine patch - Pre-op  Airway Management Planned: Oral ETT  Additional Equipment:   Intra-op Plan:   Post-operative Plan: Extubation in OR  Informed Consent: I have reviewed the patients History and Physical, chart, labs and discussed the procedure including the risks, benefits and alternatives for the proposed anesthesia with the patient or authorized representative who has indicated his/her understanding and acceptance.   Dental advisory given  Plan Discussed with: CRNA  Anesthesia Plan Comments:         Anesthesia  Quick Evaluation

## 2017-12-23 NOTE — Interval H&P Note (Signed)
History and Physical Interval Note:  12/23/2017 12:48 PM  [name]  has presented today for surgery, with the diagnosis of RIGHT URETERAL STONE  The various methods of treatment have been discussed with the patient and family. After consideration of risks, benefits and other options for treatment, the patient has consented to  Procedure(s): RIGHT URETEROSCOPY STONE REMOVALHOLMIUM LASER RIGHT /STENT EXCHANGE (Right) as a surgical intervention .  The patient's history has been reviewed, patient examined, no change in status, stable for surgery.  I have reviewed the patient's chart and labs.  Questions were answered to the patient's satisfaction.     Louis Meckel W

## 2017-12-23 NOTE — Telephone Encounter (Signed)
Received Physician Orders from [facility]; forwarded to provider/SLS 07/31

## 2017-12-23 NOTE — Anesthesia Procedure Notes (Signed)
Procedure Name: Intubation Date/Time: 12/23/2017 1:09 PM Performed by: Lind Covert, CRNA Pre-anesthesia Checklist: Patient identified, Emergency Drugs available, Suction available, Patient being monitored and Timeout performed Patient Re-evaluated:Patient Re-evaluated prior to induction Oxygen Delivery Method: Circle system utilized Preoxygenation: Pre-oxygenation with 100% oxygen Induction Type: IV induction Ventilation: Mask ventilation without difficulty Laryngoscope Size: Mac and 4 Grade View: Grade I Tube type: Oral Tube size: 7.0 mm Number of attempts: 1 Airway Equipment and Method: Stylet Placement Confirmation: ETT inserted through vocal cords under direct vision,  positive ETCO2 and breath sounds checked- equal and bilateral Secured at: 22 cm Tube secured with: Tape Dental Injury: Teeth and Oropharynx as per pre-operative assessment

## 2017-12-23 NOTE — Discharge Instructions (Signed)
DISCHARGE INSTRUCTIONS FOR KIDNEY STONE/URETERAL STENT   MEDICATIONS:  1. Resume all your other meds from home - except do not take any extra narcotic pain meds that you may have at home.  2. Pyridium is to help with the burning/stinging when you urinate. 3. Vicodin is for moderate/severe pain, otherwise taking upto 1000 mg every 6 hours of plainTylenol will help treat your pain.     ACTIVITY:  1. No strenuous activity x 1week  2. No driving while on narcotic pain medications  3. Drink plenty of water  4. Continue to walk at home - you can still get blood clots when you are at home, so keep active, but don't over do it.  5. May return to work/school tomorrow or when you feel ready   BATHING:  1. You can shower and we recommend daily showers  2. You have a string coming from your urethra: The stent string is attached to your ureteral stent. Do not pull on this.   SIGNS/SYMPTOMS TO CALL:  Please call us if you have a fever greater than 101.5, uncontrolled nausea/vomiting, uncontrolled pain, dizziness, unable to urinate, bloody urine, chest pain, shortness of breath, leg swelling, leg pain, redness around wound, drainage from wound, or any other concerns or questions.   You can reach [place] at [phone].   FOLLOW-UP:  1. We will contact you to discuss further surgery.

## 2017-12-24 ENCOUNTER — Encounter (HOSPITAL_COMMUNITY): Payer: Self-pay | Admitting: Urology

## 2017-12-24 ENCOUNTER — Other Ambulatory Visit: Payer: Self-pay | Admitting: Urology

## 2017-12-24 DIAGNOSIS — N202 Calculus of kidney with calculus of ureter: Secondary | ICD-10-CM | POA: Diagnosis not present

## 2017-12-24 NOTE — Op Note (Signed)
Preoperative diagnosis:  1. Infected left mid ureteral stone 2. Bilateral renal calculi  Postoperative diagnosis:  1. Same  Procedure: 1. Cystoscopy, right retrograde pyelogram with interpretation 2. Right ureteral stent exchange right 3. Ureteroscopy, laser lithotripsy and stone removal  Surgeon: Ardis Hughs, MD  Anesthesia: General  Complications: None  Intraoperative findings:  #1: The patient's right retrograde pyelogram demonstrated a filling defect in the distal ureter consistent with the patient's known stone.  There was also poor filling within the ureter and renal pelvis because of the debris and blood clots noted on ureteroscopy. #2: The patient had lots of very small stones that in aggregate were more than several centimeters worth.  I was able to basket the majority of the stones, but I did not get all of them.  Several of the stones in the renal pelvis required laser fragmentation. 3.:  The patient obstructing stone in the right distal ureter was completely fragmented and all pieces removed. 4.:  A 24 cm x 6 French double-J ureteral stent was exchanged on the right side.  EBL: Minimal  Specimens: None  Indication: [name] is a 62 y.o. patient with a history of urosepsis from a right mid ureteral stone.  A stent was placed several weeks prior and the patient presents today for removal of that stone as well as the stones in her right kidney.  After reviewing the management options for treatment, he elected to proceed with the above surgical procedure(s). We have discussed the potential benefits and risks of the procedure, side effects of the proposed treatment, the likelihood of the patient achieving the goals of the procedure, and any potential problems that might occur during the procedure or recuperation. Informed consent has been obtained.  Description of procedure:  The patient was taken to the operating room and general anesthesia was induced.  The  patient was placed in the dorsal lithotomy position, prepped and draped in the usual sterile fashion, and preoperative antibiotics were administered. A preoperative time-out was performed.   A 30 degree 21 French cystoscope was gently passed through the patient's urethra and the bladder under visual guidance.  Cystoscopy demonstrated an edematous bladder with no other significant abnormality.  The stent was grasped from the right ureteral orifice and brought down to the urethral meatus.  A wire was then advanced through the stent and into the right renal pelvis.  The wire was then exchanged for a 5 [place] open-ended catheter and a retrograde pyelogram was performed with the above findings.  A 6/4 French semirigid ureteroscope was then advanced to the patient's urethra and into the right ureter under visual guidance.  The stone was encountered in the mid/distal aspect of the patient's ureter.  Laser lithotripsy was performed using a 200 m fiber with settings of 10 Hz and 1J.  I then advanced the the stone was broken into numerous pieces that were then removed with a stone basket.  Ureteroscope to the renal pelvis noting no other significant abnormality.  I then remove the scope and passed a dual-lumen catheter over the safety wire.  I advanced a second wire into the right renal pelvis.  I then advanced a 12/14 French ureteral access sheath over the second wire and into the proximal ureter removing the inner portion of the sheath as well as the wire.  Then using the flexible ureteroscope I began removing the stones in the upper pole of the patient's right kidney.  These stones were very small and abundant.  Ultimately I opted to use the escape basket which helped with some of the smaller stones.  Several of the stones were too large to be removed through the access sheath and required laser fragmentation prior to removal.  At the termination of the case there were still several stones within the right kidney that I  was unable to remove.  We opted at this point to stage the procedure and replaced the stent.  I slowly backed out the access sheath again noting no significant ureteral abnormalities.  I then advanced a 24 cm x 6 French double-J ureteral stent over the wire and into the right renal pelvis.  Once it was noted to be in the pelvis it was advanced to the urethral meatus and then the wire removed leaving the stent behind.  I then performed cystoscopy again to remove any of the small fragments that it passed into the bladder and ensure the stent was well located.  The stent tether was not left on, it will be exchanged again in coming weeks.  The patient also had a B&O suppository placed in her rectum at the end of the procedure.  She was subsequently extubated and returned to the PACU in good condition.  Ardis Hughs, M.D.

## 2017-12-24 NOTE — Anesthesia Postprocedure Evaluation (Signed)
Anesthesia Post Note  Patient: [name]  Procedure(s) Performed: RIGHT URETEROSCOPY STONE REMOVAL, HOLMIUM LASER, RIGHT /STENT EXCHANGE (Right )     Patient location during evaluation: PACU Anesthesia Type: General Level of consciousness: awake and alert Pain management: pain level controlled Vital Signs Assessment: post-procedure vital signs reviewed and stable Respiratory status: spontaneous breathing, nonlabored ventilation, respiratory function stable and patient connected to nasal cannula oxygen Cardiovascular status: blood pressure returned to baseline and stable Postop Assessment: no apparent nausea or vomiting Anesthetic complications: no    Last Vitals:  Vitals:   12/23/17 1545 12/23/17 1745  BP: 123/86 135/78  Pulse: 80 76  Resp: 16 16  Temp: 37 C 36.8 C  SpO2: 94% 96%    Last Pain:  Vitals:   12/23/17 1711  TempSrc:   PainSc: 6    Pain Goal:                 Karyl Kinnier Mckinzy Fuller

## 2017-12-25 ENCOUNTER — Encounter: Payer: Self-pay | Admitting: Family

## 2017-12-30 ENCOUNTER — Encounter: Payer: Self-pay | Admitting: Family

## 2017-12-30 ENCOUNTER — Ambulatory Visit: Payer: 59 | Admitting: Family

## 2017-12-30 VITALS — BP 122/80 | HR 86 | Temp 97.8°F | Resp 20 | Ht 64.0 in | Wt 321.4 lb

## 2017-12-30 DIAGNOSIS — N200 Calculus of kidney: Secondary | ICD-10-CM | POA: Diagnosis not present

## 2017-12-30 DIAGNOSIS — R21 Rash and other nonspecific skin eruption: Secondary | ICD-10-CM | POA: Diagnosis not present

## 2017-12-30 LAB — BASIC METABOLIC PANEL
BUN: 17 mg/dL (ref 6–23)
CALCIUM: 7.9 mg/dL — AB (ref 8.4–10.5)
CO2: 35 meq/L — AB (ref 19–32)
CREATININE: 0.93 mg/dL (ref 0.40–1.20)
Chloride: 93 mEq/L — ABNORMAL LOW (ref 96–112)
GFR: 65.22 mL/min (ref >60.00)
GLUCOSE: 96 mg/dL (ref 70–99)
Potassium: 3.7 mEq/L (ref 3.5–5.1)
SODIUM: 140 meq/L (ref 135–145)

## 2017-12-30 LAB — CBC WITH DIFFERENTIAL/PLATELET
BASOS PCT: 0.5 % (ref 0.0–3.0)
Basophils Absolute: 0.1 10*3/uL (ref 0.0–0.1)
EOS PCT: 5.5 % — AB (ref 0.0–5.0)
Eosinophils Absolute: 0.8 10*3/uL — ABNORMAL HIGH (ref 0.0–0.7)
HEMATOCRIT: 43.5 % (ref 36.0–46.0)
HEMOGLOBIN: 14.2 g/dL (ref 12.0–15.0)
LYMPHS PCT: 10.1 % — AB (ref 12.0–46.0)
Lymphs Abs: 1.4 10*3/uL (ref 0.7–4.0)
MCHC: 32.6 g/dL (ref 30.0–36.0)
MCV: 87.2 fl (ref 78.0–100.0)
Monocytes Absolute: 0.7 10*3/uL (ref 0.1–1.0)
Monocytes Relative: 5 % (ref 3.0–12.0)
Neutro Abs: 11 10*3/uL — ABNORMAL HIGH (ref 1.4–7.7)
Neutrophils Relative %: 78.9 % — ABNORMAL HIGH (ref 43.0–77.0)
Platelets: 270 10*3/uL (ref 150.0–400.0)
RBC: 4.99 Mil/uL (ref 3.87–5.11)
RDW: 15.5 % (ref 11.5–15.5)
WBC: 13.9 10*3/uL — AB (ref 4.0–10.5)

## 2017-12-30 MED ORDER — PREDNISONE 10 MG PO TABS: ORAL_TABLET | ORAL | 0 refills | Status: DC

## 2017-12-30 MED ORDER — ONDANSETRON HCL 4 MG PO TABS: 4.0000 mg | ORAL_TABLET | Freq: Three times a day (TID) | ORAL | 0 refills | Status: DC | PRN

## 2017-12-30 NOTE — Progress Notes (Signed)
Subjective:    Patient ID: [name], female    DOB: 05-31-1955, 62 y.o.   MRN: [id number]  HPI  Patient is a 62 yr old female who presents today for follow up.    Patient underwent RIGHT URETEROSCOPY STONE REMOVALHOLMIUM LASER RIGHT /STENT EXCHANGE on 12/23/17.  Reports that procedure was unable to be completed at that time and as a result she is scheduled for ureteroscopy/lithotripsy, right stent exchange and left stent placement is scheduled on 01/15/18.    Wt Readings from Last 3 Encounters:  12/30/17 (!) 321 lb 6.4 oz (145.8 kg)  12/23/17 (!) 327 lb 8 oz (148.6 kg)  12/21/17 (!) 327 lb 8 oz (148.6 kg)   Skin rash- was referred to derm last visit. She had to reschedule her visit and has not seen them.   HTN- maintained off of losartan.  BP Readings from Last 3 Encounters:  12/30/17 122/80  12/23/17 135/78  12/21/17 137/78   Last dose of cipro was on 7/3/19Notes very mild improvement in her rash.   Review of Systems See HPI  Past Medical History:  Diagnosis Date  . Abnormal Pap smear    years ago/no biopsy  . Allergic rhinitis   . Anemia, iron deficiency   . Aortic atherosclerosis ([place])   . Arthritis    back- lower  . Asthma   . Atrial fibrillation ([place]) August 2012   OFF XARELTO LAST MONTH DUE TO BLEEDING IN STOOL  . Bursitis of hip left  . Chronic diastolic (congestive) heart failure ([place])   . Dysrhythmia    afib, followed by Dr. Stanford Breed   . Edema of both legs   . Fatty liver 01/07/2012  . Fatty liver   . Fibroid 1974   fibroid cyst on left fallopian tube  . Fibromyalgia   . Foot ulcer ([place])    AREA HEALED RIGHT FOOT  . GERD (gastroesophageal reflux disease)   . Headache(784.0)    occasional sinus headache   . Hepatomegaly   . History of blood transfusion JULY 2015  . History of E. coli septicemia   . History of MRSA infection   . Hypertension   . Hypoparathyroidism ([place]) 01/07/2011  . Hypothyroidism   . Kidney stone Sep 26, 2015   passed on their  own  . Morbid obesity ([place])   . Nephrolithiasis 2/16, 9/16   SEES DR Risa Grill  . Pernicious anemia 02/20/2014   followed by Debbrah Alar  . PONV (postoperative nausea and vomiting)   . Rash    thighs and back  . Seizures ([place])    infancy secondary to fever  . Thyroid cancer ([place]) 2011   THYROIDECTOMY DONE  . Uterine cancer ([place]) 2014   Mirena IUD  . Vitamin B 12 deficiency   . Vitamin D deficiency      Social History   Socioeconomic History  . Marital status: Single    Spouse name: Not on file  . Number of children: 0  . Years of education: Not on file  . Highest education level: Not on file  Occupational History  . Occupation: works in Insurance claims handler  . Occupation: DESIGN COMPUTER CHIP    Employer: [employer]  Social Needs  . Financial resource strain: Not on file  . Food insecurity:    Worry: Not on file    Inability: Not on file  . Transportation needs:    Medical: Not on file    Non-medical: Not on file  Tobacco Use  .  Smoking status: Former Smoker    Packs/day: 0.50    Years: 25.00    Pack years: 12.50    Types: Cigarettes    Start date: 12/24/1970    Last attempt to quit: 05/27/1995    Years since quitting: 22.6  . Smokeless tobacco: Never Used  . Tobacco comment: quit smoking 19 years ago  Substance and Sexual Activity  . Alcohol use: Yes    Alcohol/week: 0.0 oz    Comment: 1/2 glass per month  . Drug use: No  . Sexual activity: Yes    Partners: Male    Birth control/protection: Post-menopausal  Lifestyle  . Physical activity:    Days per week: Not on file    Minutes per session: Not on file  . Stress: Not on file  Relationships  . Social connections:    Talks on phone: Not on file    Gets together: Not on file    Attends religious service: Not on file    Active member of club or organization: Not on file    Attends meetings of clubs or organizations: Not on file    Relationship status: Not on file  . Intimate partner violence:     Fear of current or ex partner: Not on file    Emotionally abused: Not on file    Physically abused: Not on file    Forced sexual activity: Not on file  Other Topics Concern  . Not on file  Social History Narrative   Occupation: works in Insurance claims handler - Field seismologist   Single       Former Smoker - quit tobacco 12 years ago.  She was light smoker for 10 years.                 Past Surgical History:  Procedure Laterality Date  . AMPUTATION Right 05/18/2014   Procedure: RIGHT FIFTH RAY AMPUTATION FOOT;  Surgeon: Wylene Simmer, MD;  Location: [facility];  Service: Orthopedics;  Laterality: Right;  . APPENDECTOMY    . BUNIONECTOMY     bilateral  . COLONOSCOPY WITH PROPOFOL N/A 02/02/2014   Procedure: COLONOSCOPY WITH PROPOFOL;  Surgeon: Irene Shipper, MD;  Location: [facility];  Service: Endoscopy;  Laterality: N/A;  . cyst on ovary removed     . CYSTOSCOPY W/ RETROGRADES  03/03/2012   Procedure: CYSTOSCOPY WITH RETROGRADE PYELOGRAM;  Surgeon: Bernestine Amass, MD;  Location: [facility];  Service: Urology;  Laterality: Bilateral;  . CYSTOSCOPY W/ URETERAL STENT PLACEMENT Right 11/25/2017   Procedure: CYSTOSCOPY WITH RETROGRADE RIGHT URETERAL STENT PLACEMENT;  Surgeon: Franchot Gallo, MD;  Location: [facility];  Service: Urology;  Laterality: Right;  . CYSTOSCOPY WITH RETROGRADE PYELOGRAM, URETEROSCOPY AND STENT PLACEMENT Left 08/25/2012   Procedure: CYSTOSCOPY WITH RETROGRADE PYELOGRAM, URETEROSCOPY;  Surgeon: Bernestine Amass, MD;  Location: [facility];  Service: Urology;  Laterality: Left;  . CYSTOSCOPY/URETEROSCOPY/HOLMIUM LASER/STENT PLACEMENT Right 12/23/2017   Procedure: RIGHT URETEROSCOPY STONE REMOVAL, HOLMIUM LASER, RIGHT /STENT EXCHANGE;  Surgeon: Ardis Hughs, MD;  Location: [facility];  Service: Urology;  Laterality: Right;  . DILATION AND CURETTAGE OF UTERUS N/A 02/21/2013   Procedure: DILATATION AND CURETTAGE;  Surgeon: Lyman Speller, MD;  Location: [facility];  Service: Gynecology;   Laterality: N/A;  . DILATION AND CURETTAGE OF UTERUS N/A 04/09/2015   Procedure: [place] IUD removal;  Surgeon: Megan Salon, MD;  Location: [facility];  Service: Gynecology;  Laterality: N/A;  Patient weight 307lbs  .  ESOPHAGOGASTRODUODENOSCOPY (EGD) WITH PROPOFOL N/A 02/02/2014   Procedure: ESOPHAGOGASTRODUODENOSCOPY (EGD) WITH PROPOFOL;  Surgeon: Irene Shipper, MD;  [facility]: [facility];  Service: Endoscopy;  Laterality: N/A;  . GASTRIC BYPASS  1974  . HOLMIUM LASER APPLICATION Left 07/03/4130   Procedure: HOLMIUM LASER APPLICATION;  Surgeon: Bernestine Amass, MD;  Location: [facility];  Service: Urology;  Laterality: Left;  . LITHOTRIPSY  03/2012  . LITHOTRIPSY  2/16  . THYROIDECTOMY  05/15/2010  . TONSILLECTOMY AND ADENOIDECTOMY    . URETEROSCOPY  03/03/2012   Procedure: URETEROSCOPY;  Surgeon: Bernestine Amass, MD;  Location: [facility];  Service: Urology;  Laterality: Left;    Family History  Problem Relation Age of Onset  . Hypertension Father   . Diabetes Father   . Lung cancer Father   . Heart attack Father        MI at age 17  . Hypertension Mother   . Hyperthyroidism Mother   . Heart disease Mother   . Heart attack Mother        MI at age 72  . Asthma Brother   . Hypertension Brother        younger  . Heart disease Brother        older  . Colon cancer Neg Hx   . Esophageal cancer Neg Hx   . Stomach cancer Neg Hx   . Kidney disease Neg Hx   . Liver disease Neg Hx     Allergies  Allergen Reactions  . Other Anaphylaxis    NUTS  . Penicillins Other (See Comments)    Headache. Has patient had a PCN reaction causing immediate rash, facial/tongue/throat swelling, SOB or lightheadedness with hypotension: No Has patient had a PCN reaction causing severe rash involving mucus membranes or skin necrosis: No Has patient had a PCN reaction that required hospitalization: No Has patient had a PCN reaction occurring within the last 10 years: Yes If all of the above  answers are "NO", then may proceed with Cephalosporin use.   [name] Amoxicillin-Pot Clavulanate Other (See Comments)    headache  . Food Hives    Potato  . Tomato Hives    Current Outpatient Medications on File Prior to Visit  Medication Sig Dispense Refill  . acetaminophen (TYLENOL) 325 MG tablet Take 2 tablets (650 mg total) by mouth every 6 (six) hours as needed for mild pain or moderate pain. 60 tablet 0  . albuterol (PROVENTIL HFA;VENTOLIN HFA) 108 (90 Base) MCG/ACT inhaler Inhale 2 puffs into the lungs every 6 (six) hours as needed for wheezing or shortness of breath. 1 Inhaler 5  . apixaban (ELIQUIS) 5 MG TABS tablet Take 5 mg by mouth 2 (two) times daily.    . calcitRIOL (ROCALTROL) 0.5 MCG capsule Take 2 mcg by mouth daily.     . calcium carbonate (TUMS - DOSED IN MG ELEMENTAL CALCIUM) 500 MG chewable tablet Chew 2 tablets (400 mg of elemental calcium total) by mouth 4 (four) times daily. (Patient taking differently: Chew 2 tablets by mouth 2 (two) times daily. )    . citric acid-potassium citrate (POLYCITRA) 1100-334 MG/5ML solution Take 10 mEq by mouth daily.    . Cyanocobalamin (VITAMIN B-12 IJ) Inject as directed every 30 (thirty) days.    [name] desoximetasone (TOPICORT) 0.05 % cream Apply topically 2 (two) times daily as needed. 60 g 0  . diltiazem (CARDIZEM CD) 240 MG 24 hr capsule TAKE 1 CAPSULE BY MOUTH  DAILY 90 capsule 0  .  diphenhydrAMINE (BENADRYL) 25 mg capsule Take 25 mg by mouth 3 (three) times daily.     . fluticasone (FLONASE) 50 MCG/ACT nasal spray Place 2 sprays into both nostrils daily.     . furosemide (LASIX) 40 MG tablet 2 tabs by mouth daily in the AM and 1 tablet in the PM (Patient taking differently: Take 40-80 mg by mouth See admin instructions. 80mg  in the morning and 80mg  at night) 270 tablet 1  . HYDROcodone-acetaminophen (NORCO/VICODIN) 5-325 MG tablet Take 1-2 tablets by mouth every 6 (six) hours as needed. 15 tablet 0  . levocetirizine (XYZAL) 5 MG tablet TAKE  1 TABLET BY MOUTH  EVERY EVENING 90 tablet 3  . levothyroxine (SYNTHROID, LEVOTHROID) 100 MCG tablet Take 350 mcg by mouth daily.     . magnesium 30 MG tablet Take 30 mg by mouth daily.    [name] neomycin-bacitracin-polymyxin (NEOSPORIN) ointment Apply 1 application topically as needed for wound care. apply to eye    . nystatin (MYCOSTATIN/NYSTOP) powder Apply topically 3 (three) times daily. 15 g 0  . omeprazole (PRILOSEC) 40 MG capsule TAKE 1 CAPSULE BY MOUTH  DAILY 90 capsule 1  . phenazopyridine (PYRIDIUM) 200 MG tablet Take 1 tablet (200 mg total) by mouth 3 (three) times daily as needed for pain. 20 tablet 0  . saccharomyces boulardii (FLORASTOR) 250 MG capsule Take 1 capsule (250 mg total) by mouth 2 (two) times daily. (Patient taking differently: Take 250 mg by mouth daily. ) 30 capsule 0  . SYMBICORT 160-4.5 MCG/ACT inhaler INHALE 2 PUFFS TWO TIMES  DAILY 30.6 g 1  . Vitamin D, Ergocalciferol, (DRISDOL) 50000 units CAPS capsule TAKE 1 CAPSULE BY MOUTH 3  TIMES WEEKLY 39 capsule 1  . zafirlukast (ACCOLATE) 20 MG tablet TAKE 1 TABLET BY MOUTH TWO  TIMES DAILY BEFORE MEALS 180 tablet 1   No current facility-administered medications on file prior to visit.     BP 122/80 (BP Location: Right Arm) Comment (Cuff Size): thigh cuff  Pulse 86   Temp 97.8 F (36.6 C) (Oral)   Resp 20   Ht 5\' 4"  (1.626 m)   Wt (!) 321 lb 6.4 oz (145.8 kg)   LMP 06/11/2012   SpO2 98%   BMI 55.17 kg/m       Objective:   Physical Exam  Constitutional: She appears well-developed and well-nourished.  Cardiovascular: Normal rate, regular rhythm and normal heart sounds.  No murmur heard. Pulmonary/Chest: Effort normal and breath sounds normal. No respiratory distress. She has no wheezes.  Skin:  + rash noted bilateral arms/abdomen/lower back   Psychiatric: She has a normal mood and affect. Her behavior is normal. Judgment and thought content normal.          Assessment & Plan:  Rash- suspect secondary to  cipro which she has been off of for 1 week now. Having a lot of pruritis. Will rx with prednisone.  Nephrolithiasis- persistent leukocytosis, will have additional procedure on 8/23 to complete stone removal on 8/23.  Check follow up bmet cbc. She has had nausea since she began to have issues with nephrolithiasis and is requesting rx for  Nausea. Rx sent for zofran.

## 2017-12-30 NOTE — Patient Instructions (Signed)
Please complete lab work prior to leaving. Begin prednisone for rash/itching. You may use zofran as needed for nausea. Call if fever, or if rash does not improve.

## 2018-01-11 DIAGNOSIS — N202 Calculus of kidney with calculus of ureter: Secondary | ICD-10-CM | POA: Diagnosis not present

## 2018-01-13 ENCOUNTER — Ambulatory Visit (INDEPENDENT_AMBULATORY_CARE_PROVIDER_SITE_OTHER): Payer: 59

## 2018-01-13 ENCOUNTER — Ambulatory Visit: Payer: 59

## 2018-01-13 ENCOUNTER — Other Ambulatory Visit: Payer: Self-pay | Admitting: Family

## 2018-01-13 DIAGNOSIS — E538 Deficiency of other specified B group vitamins: Secondary | ICD-10-CM

## 2018-01-13 MED ORDER — CYANOCOBALAMIN 1000 MCG/ML IJ SOLN
1000.0000 ug | Freq: Once | INTRAMUSCULAR | Status: AC
  Administered 2018-01-13: 1000 ug via INTRAMUSCULAR

## 2018-01-13 NOTE — Progress Notes (Signed)
Pre visit review using our clinic tool,if applicable. No additional management support is needed unless otherwise documented below in the visit note.   Pt here for monthly B12 injection per order from Debbrah Alar, NP  B12 1040mcg given IM Right Deltoid, and pt tolerated injection well.  No complaints voiced this visit.  Next B12 injection scheduled for 1 month. Patient aware.

## 2018-01-14 ENCOUNTER — Encounter (HOSPITAL_COMMUNITY): Payer: Self-pay | Admitting: *Deleted

## 2018-01-14 MED ORDER — GENTAMICIN SULFATE 40 MG/ML IJ SOLN
5.0000 mg/kg | INTRAVENOUS | Status: AC
  Administered 2018-01-15: 430 mg via INTRAVENOUS
  Filled 2018-01-14: qty 11.25

## 2018-01-15 ENCOUNTER — Ambulatory Visit (HOSPITAL_COMMUNITY): Payer: 59

## 2018-01-15 ENCOUNTER — Encounter (HOSPITAL_COMMUNITY)
Admission: RE | Disposition: A | Discharge status: Home / Self Care | Payer: Self-pay | Source: Ambulatory Visit | Attending: Urology

## 2018-01-15 ENCOUNTER — Ambulatory Visit: Payer: 59

## 2018-01-15 ENCOUNTER — Encounter (HOSPITAL_COMMUNITY): Payer: Self-pay | Admitting: *Deleted

## 2018-01-15 ENCOUNTER — Ambulatory Visit (HOSPITAL_COMMUNITY): Payer: 59 | Admitting: Certified Registered Nurse Anesthetist

## 2018-01-15 ENCOUNTER — Ambulatory Visit (HOSPITAL_COMMUNITY)
Admission: RE | Admit: 2018-01-15 | Discharge: 2018-01-15 | Disposition: A | Discharge status: Home / Self Care | Payer: 59 | Source: Ambulatory Visit | Attending: Urology | Admitting: Urology

## 2018-01-15 ENCOUNTER — Other Ambulatory Visit: Payer: Self-pay

## 2018-01-15 DIAGNOSIS — K760 Fatty (change of) liver, not elsewhere classified: Secondary | ICD-10-CM | POA: Diagnosis not present

## 2018-01-15 DIAGNOSIS — Z87891 Personal history of nicotine dependence: Secondary | ICD-10-CM | POA: Diagnosis not present

## 2018-01-15 DIAGNOSIS — D509 Iron deficiency anemia, unspecified: Secondary | ICD-10-CM | POA: Insufficient documentation

## 2018-01-15 DIAGNOSIS — K219 Gastro-esophageal reflux disease without esophagitis: Secondary | ICD-10-CM | POA: Insufficient documentation

## 2018-01-15 DIAGNOSIS — J45909 Unspecified asthma, uncomplicated: Secondary | ICD-10-CM | POA: Insufficient documentation

## 2018-01-15 DIAGNOSIS — Z79899 Other long term (current) drug therapy: Secondary | ICD-10-CM | POA: Diagnosis not present

## 2018-01-15 DIAGNOSIS — E039 Hypothyroidism, unspecified: Secondary | ICD-10-CM | POA: Insufficient documentation

## 2018-01-15 DIAGNOSIS — I10 Essential (primary) hypertension: Secondary | ICD-10-CM | POA: Insufficient documentation

## 2018-01-15 DIAGNOSIS — N201 Calculus of ureter: Secondary | ICD-10-CM | POA: Insufficient documentation

## 2018-01-15 DIAGNOSIS — I4891 Unspecified atrial fibrillation: Secondary | ICD-10-CM | POA: Insufficient documentation

## 2018-01-15 DIAGNOSIS — Z7902 Long term (current) use of antithrombotics/antiplatelets: Secondary | ICD-10-CM | POA: Insufficient documentation

## 2018-01-15 DIAGNOSIS — Z6841 Body Mass Index (BMI) 40.0 and over, adult: Secondary | ICD-10-CM | POA: Diagnosis not present

## 2018-01-15 HISTORY — PX: CYSTOSCOPY W/ URETERAL STENT PLACEMENT: SHX1429

## 2018-01-15 HISTORY — PX: URETEROSCOPY WITH HOLMIUM LASER LITHOTRIPSY: SHX6645

## 2018-01-15 HISTORY — PX: CYSTOSCOPY WITH STENT PLACEMENT: SHX5790

## 2018-01-15 SURGERY — URETEROSCOPY, WITH LITHOTRIPSY USING HOLMIUM LASER
Anesthesia: General | Laterality: Right

## 2018-01-15 MED ORDER — MIDAZOLAM HCL 2 MG/2ML IJ SOLN
INTRAMUSCULAR | Status: AC
  Filled 2018-01-15: qty 2

## 2018-01-15 MED ORDER — FENTANYL CITRATE (PF) 100 MCG/2ML IJ SOLN
INTRAMUSCULAR | Status: AC
  Filled 2018-01-15: qty 2

## 2018-01-15 MED ORDER — SODIUM CHLORIDE 0.9 % IR SOLN
Status: DC | PRN
  Administered 2018-01-15: 6000 mL via INTRAVESICAL

## 2018-01-15 MED ORDER — PHENAZOPYRIDINE HCL 200 MG PO TABS: 200.0000 mg | ORAL_TABLET | Freq: Three times a day (TID) | ORAL | 0 refills | Status: DC | PRN

## 2018-01-15 MED ORDER — GLYCOPYRROLATE PF 0.2 MG/ML IJ SOSY
PREFILLED_SYRINGE | INTRAMUSCULAR | Status: AC
  Filled 2018-01-15: qty 1

## 2018-01-15 MED ORDER — FENTANYL CITRATE (PF) 100 MCG/2ML IJ SOLN
25.0000 ug | INTRAMUSCULAR | Status: DC | PRN
  Administered 2018-01-15: 25 ug via INTRAVENOUS

## 2018-01-15 MED ORDER — ACETAMINOPHEN 10 MG/ML IV SOLN
1000.0000 mg | Freq: Once | INTRAVENOUS | Status: AC
  Administered 2018-01-15: 1000 mg via INTRAVENOUS

## 2018-01-15 MED ORDER — ACETAMINOPHEN 10 MG/ML IV SOLN
INTRAVENOUS | Status: AC
  Filled 2018-01-15: qty 100

## 2018-01-15 MED ORDER — HYDROCODONE-ACETAMINOPHEN 5-325 MG PO TABS: 1.0000 | ORAL_TABLET | Freq: Four times a day (QID) | ORAL | 0 refills | Status: DC | PRN

## 2018-01-15 MED ORDER — SULFAMETHOXAZOLE-TRIMETHOPRIM 800-160 MG PO TABS: 1.0000 | ORAL_TABLET | Freq: Once | ORAL | 0 refills | Status: AC

## 2018-01-15 MED ORDER — MIDAZOLAM HCL 5 MG/5ML IJ SOLN
INTRAMUSCULAR | Status: DC | PRN
  Administered 2018-01-15: 2 mg via INTRAVENOUS

## 2018-01-15 MED ORDER — SCOPOLAMINE 1 MG/3DAYS TD PT72
MEDICATED_PATCH | TRANSDERMAL | Status: AC
  Filled 2018-01-15: qty 1

## 2018-01-15 MED ORDER — ONDANSETRON HCL 4 MG/2ML IJ SOLN
INTRAMUSCULAR | Status: DC | PRN
  Administered 2018-01-15: 4 mg via INTRAVENOUS

## 2018-01-15 MED ORDER — 0.9 % SODIUM CHLORIDE (POUR BTL) OPTIME
TOPICAL | Status: DC | PRN
  Administered 2018-01-15: 1000 mL

## 2018-01-15 MED ORDER — SULFAMETHOXAZOLE-TRIMETHOPRIM 800-160 MG PO TABS: 1.0000 | ORAL_TABLET | Freq: Once | ORAL | 0 refills | Status: DC

## 2018-01-15 MED ORDER — IOHEXOL 300 MG/ML  SOLN
INTRAMUSCULAR | Status: DC | PRN
  Administered 2018-01-15: 45 mL via URETHRAL

## 2018-01-15 MED ORDER — PROPOFOL 10 MG/ML IV BOLUS
INTRAVENOUS | Status: DC | PRN
  Administered 2018-01-15: 150 mg via INTRAVENOUS

## 2018-01-15 MED ORDER — LIDOCAINE 2% (20 MG/ML) 5 ML SYRINGE
INTRAMUSCULAR | Status: DC | PRN
  Administered 2018-01-15: 100 mg via INTRAVENOUS

## 2018-01-15 MED ORDER — ONDANSETRON HCL 4 MG/2ML IJ SOLN
INTRAMUSCULAR | Status: AC
  Filled 2018-01-15: qty 2

## 2018-01-15 MED ORDER — ROCURONIUM BROMIDE 10 MG/ML (PF) SYRINGE
PREFILLED_SYRINGE | INTRAVENOUS | Status: AC
  Filled 2018-01-15: qty 10

## 2018-01-15 MED ORDER — LIDOCAINE 2% (20 MG/ML) 5 ML SYRINGE
INTRAMUSCULAR | Status: AC
  Filled 2018-01-15: qty 5

## 2018-01-15 MED ORDER — METOCLOPRAMIDE HCL 5 MG/ML IJ SOLN: 10.0000 mg | Freq: Once | INTRAMUSCULAR | Status: DC | PRN

## 2018-01-15 MED ORDER — SCOPOLAMINE 1 MG/3DAYS TD PT72
MEDICATED_PATCH | TRANSDERMAL | Status: DC | PRN
  Administered 2018-01-15: 1 via TRANSDERMAL

## 2018-01-15 MED ORDER — LACTATED RINGERS IV SOLN
INTRAVENOUS | Status: DC
  Administered 2018-01-15 (×2): via INTRAVENOUS

## 2018-01-15 MED ORDER — DEXAMETHASONE SODIUM PHOSPHATE 10 MG/ML IJ SOLN
INTRAMUSCULAR | Status: AC
  Filled 2018-01-15: qty 1

## 2018-01-15 MED ORDER — LACTATED RINGERS IV SOLN: INTRAVENOUS | Status: DC

## 2018-01-15 MED ORDER — MEPERIDINE HCL 50 MG/ML IJ SOLN: 6.2500 mg | INTRAMUSCULAR | Status: DC | PRN

## 2018-01-15 MED ORDER — GLYCOPYRROLATE PF 0.2 MG/ML IJ SOSY
PREFILLED_SYRINGE | INTRAMUSCULAR | Status: DC | PRN
  Administered 2018-01-15: .2 mg via INTRAVENOUS

## 2018-01-15 MED ORDER — DEXAMETHASONE SODIUM PHOSPHATE 4 MG/ML IJ SOLN
INTRAMUSCULAR | Status: DC | PRN
  Administered 2018-01-15: 5 mg via INTRAVENOUS

## 2018-01-15 MED ORDER — FENTANYL CITRATE (PF) 100 MCG/2ML IJ SOLN
INTRAMUSCULAR | Status: DC | PRN
  Administered 2018-01-15 (×2): 25 ug via INTRAVENOUS
  Administered 2018-01-15 (×2): 50 ug via INTRAVENOUS
  Administered 2018-01-15 (×3): 25 ug via INTRAVENOUS

## 2018-01-15 MED ORDER — FENTANYL CITRATE (PF) 100 MCG/2ML IJ SOLN
INTRAMUSCULAR | Status: AC
  Administered 2018-01-15: 25 ug via INTRAVENOUS
  Filled 2018-01-15: qty 2

## 2018-01-15 SURGICAL SUPPLY — 15 items
BAG URO CATCHER STRL LF (MISCELLANEOUS) ×4 IMPLANT
BASKET STONE NCOMPASS (UROLOGICAL SUPPLIES) ×2 IMPLANT
CATH URET 5FR 28IN OPEN ENDED (CATHETERS) ×4 IMPLANT
CATH URET DUAL LUMEN 6-10FR 50 (CATHETERS) ×1 IMPLANT
CLOTH BEACON ORANGE TIMEOUT ST (SAFETY) ×4 IMPLANT
EXTRACTOR STONE 1.7FRX115CM (UROLOGICAL SUPPLIES) ×1 IMPLANT
FIBER LASER TRAC TIP (UROLOGICAL SUPPLIES) ×1 IMPLANT
GLOVE BIOGEL M STRL SZ7.5 (GLOVE) ×4 IMPLANT
GOWN STRL REUS W/TWL LRG LVL3 (GOWN DISPOSABLE) ×5 IMPLANT
GUIDEWIRE STR DUAL SENSOR (WIRE) ×6 IMPLANT
MANIFOLD NEPTUNE II (INSTRUMENTS) ×4 IMPLANT
PACK CYSTO (CUSTOM PROCEDURE TRAY) ×4 IMPLANT
SHEATH URETERAL 12FRX35CM (MISCELLANEOUS) ×1 IMPLANT
STENT URET 6FRX24 CONTOUR (STENTS) ×1 IMPLANT
TUBING CONNECTING 10 (TUBING) ×2 IMPLANT

## 2018-01-15 NOTE — H&P (Signed)
History of present illness: 62-year-old female presents today for follow-up ureteroscopy.  She was initially admitted to the hospital 4 weeks prior with infected stone on the right side.  She underwent stent placement subsequent antibiotics.  She then presented to the operating room and her stones were removed.  The time we also place a stent on the left side and make plans to return today for a staged procedure to remove the remaining stones in her right and attempt at removing all the stent on the left side simultaneously.  Since the patient's last procedure she is done well.  She has had some mild hematuria.  She denies any dysuria.  She denies any fevers or chills.  Her pain is been well controlled.  The patient was given Bactrim double strength twice yesterday as preventative measures to reduce the risk of developing urosepsis from today's procedure.  Review of systems: A 12 point comprehensive review of systems was obtained and is negative unless otherwise stated in the history of present illness.  Patient Active Problem List   Diagnosis Date Noted  . Sepsis due to Escherichia coli (E. coli) ([place]) 11/28/2017  . E coli bacteremia 11/28/2017  . Sepsis ([place]) 11/24/2017  . Hypokalemia 12/10/2016  . Hypocalcemia 12/10/2016  . Recurrent falls 12/09/2016  . Right ureteral stone 07/17/2014  . Osteomyelitis ([place]) 06/14/2014  . Pernicious anemia 02/20/2014  . Reflux esophagitis 02/02/2014  . Edema 11/23/2013  . Foot pain 03/31/2013  . Endometrial adenocarcinoma ([place]) 02/21/2013  . Trochanteric bursitis of left hip 09/13/2012  . Muscle cramps 08/12/2012  . Fatty liver 01/07/2012  . Mid back pain 09/30/2011  . Weight gain 07/02/2011  . Nonspecific abnormal unspecified cardiovascular function study 04/30/2011  . Hx of papillary thyroid carcinoma 04/07/2011  . Low back pain 04/01/2011  . Atrial fibrillation ([place]) 02/05/2011  . Hypoparathyroidism ([place]) 01/07/2011  . GERD 05/22/2009  .  Leukocytosis 03/24/2009  . OBESITY, MORBID 12/20/2008  . RHINOSINUSITIS, RECURRENT 09/07/2008  . Vitamin D deficiency 05/10/2007  . Iron deficiency anemia secondary to malabsorption  05/10/2007  . Hypothyroidism 01/05/2007  . Essential hypertension 01/05/2007  . ALLERGIC RHINITIS 01/05/2007  . Asthma 01/05/2007    No current facility-administered medications on file prior to encounter.    Current Outpatient Medications on File Prior to Encounter  Medication Sig Dispense Refill  . acetaminophen (TYLENOL) 325 MG tablet Take 2 tablets (650 mg total) by mouth every 6 (six) hours as needed for mild pain or moderate pain. 60 tablet 0  . albuterol (PROVENTIL HFA;VENTOLIN HFA) 108 (90 Base) MCG/ACT inhaler Inhale 2 puffs into the lungs every 6 (six) hours as needed for wheezing or shortness of breath. 1 Inhaler 5  . apixaban (ELIQUIS) 5 MG TABS tablet Take 5 mg by mouth 2 (two) times daily.    . calcitRIOL (ROCALTROL) 0.5 MCG capsule Take 2 mcg by mouth daily.     . calcium carbonate (TUMS - DOSED IN MG ELEMENTAL CALCIUM) 500 MG chewable tablet Chew 2 tablets (400 mg of elemental calcium total) by mouth 4 (four) times daily. (Patient taking differently: Chew 2 tablets by mouth 3 (three) times daily. )    . citric acid-potassium citrate (POLYCITRA) 1100-334 MG/5ML solution Take 1 mL by mouth 2 (two) times daily.     . Cyanocobalamin (VITAMIN B-12 IJ) Inject 1,000 mcg as directed every 30 (thirty) days.     . diphenhydrAMINE (BENADRYL) 25 mg capsule Take 25 mg by mouth every 6 (six) hours as needed for  itching.     . fluticasone (FLONASE) 50 MCG/ACT nasal spray Place 1 spray into both nostrils daily.     . furosemide (LASIX) 40 MG tablet 2 tabs by mouth daily in the AM and 1 tablet in the PM (Patient taking differently: Take 80 mg by mouth 2 (two) times daily. ) 270 tablet 1  . HYDROcodone-acetaminophen (NORCO/VICODIN) 5-325 MG tablet Take 1-2 tablets by mouth every 6 (six) hours as needed. (Patient  taking differently: Take 1-2 tablets by mouth every 6 (six) hours as needed. Out of medication x 7 days) 15 tablet 0  . levocetirizine (XYZAL) 5 MG tablet TAKE 1 TABLET BY MOUTH  EVERY EVENING 90 tablet 3  . levothyroxine (SYNTHROID, LEVOTHROID) 100 MCG tablet Take 350 mcg by mouth daily.     [name] loratadine (CLARITIN) 10 MG tablet Take 10 mg by mouth every morning.    . Magnesium 125 MG CAPS Take 125 mg by mouth 2 (two) times daily.     . Melatonin 5 MG TABS Take 5 mg by mouth at bedtime.    . mometasone (ELOCON) 0.1 % ointment Apply 1 application topically 2 (two) times daily.    [name] omeprazole (PRILOSEC) 40 MG capsule TAKE 1 CAPSULE BY MOUTH  DAILY 90 capsule 1  . saccharomyces boulardii (FLORASTOR) 250 MG capsule Take 1 capsule (250 mg total) by mouth 2 (two) times daily. (Patient taking differently: Take 250 mg by mouth daily as needed (regularity). ) 30 capsule 0  . SYMBICORT 160-4.5 MCG/ACT inhaler INHALE 2 PUFFS TWO TIMES  DAILY (Patient taking differently: Inhale 2 puffs into the lungs 2 (two) times daily. ) 30.6 g 1  . Vitamin D, Ergocalciferol, (DRISDOL) 50000 units CAPS capsule TAKE 1 CAPSULE BY MOUTH 3  TIMES WEEKLY (Patient taking differently: Take 50,000 Units by mouth every Monday, Wednesday, and Friday. ) 39 capsule 1  . zafirlukast (ACCOLATE) 20 MG tablet TAKE 1 TABLET BY MOUTH TWO  TIMES DAILY BEFORE MEALS (Patient taking differently: Take 20 mg by mouth 2 (two) times daily before a meal. ) 180 tablet 1  . desoximetasone (TOPICORT) 0.05 % cream Apply topically 2 (two) times daily as needed. 60 g 0  . nystatin (MYCOSTATIN/NYSTOP) powder Apply topically 3 (three) times daily. 15 g 0  . phenazopyridine (PYRIDIUM) 200 MG tablet Take 1 tablet (200 mg total) by mouth 3 (three) times daily as needed for pain. 20 tablet 0    Past Medical History:  Diagnosis Date  . Abnormal Pap smear    years ago/no biopsy  . Allergic rhinitis   . Anemia, iron deficiency   . Aortic atherosclerosis ([place])    . Arthritis    back- lower  . Asthma   . Atrial fibrillation ([place]) August 2012   OFF XARELTO LAST MONTH DUE TO BLEEDING IN STOOL  . Bursitis of hip left  . Chronic diastolic (congestive) heart failure ([place])   . Dysrhythmia    afib, followed by Dr. Stanford Breed   . Edema of both legs   . Fatty liver 01/07/2012  . Fatty liver   . Fibroid 1974   fibroid cyst on left fallopian tube  . Fibromyalgia   . Foot ulcer ([place])    AREA HEALED RIGHT FOOT  . GERD (gastroesophageal reflux disease)   . Headache(784.0)    occasional sinus headache   . Hepatomegaly   . History of blood transfusion JULY 2015  . History of E. coli septicemia   . History of MRSA infection   .  Hypertension   . Hypoparathyroidism ([place]) 01/07/2011  . Hypothyroidism   . Kidney stone September 23, 2015   passed on their own  . Morbid obesity ([place])   . Nephrolithiasis 2/16, 9/16   SEES DR Risa Grill  . Pernicious anemia 02/20/2014   followed by Debbrah Alar  . PONV (postoperative nausea and vomiting)   . Rash    thighs and back  . Seizures ([place])    infancy secondary to fever  . Thyroid cancer ([place]) 2011   THYROIDECTOMY DONE  . Uterine cancer ([place]) 2014   Mirena IUD  . Vitamin B 12 deficiency   . Vitamin D deficiency     Past Surgical History:  Procedure Laterality Date  . AMPUTATION Right 05/18/2014   Procedure: RIGHT FIFTH RAY AMPUTATION FOOT;  Surgeon: Wylene Simmer, MD;  Location: [facility];  Service: Orthopedics;  Laterality: Right;  . APPENDECTOMY    . BUNIONECTOMY     bilateral  . COLONOSCOPY WITH PROPOFOL N/A 02/02/2014   Procedure: COLONOSCOPY WITH PROPOFOL;  Surgeon: Irene Shipper, MD;  [facility];  Service: Endoscopy;  Laterality: N/A;  . cyst on ovary removed     . CYSTOSCOPY W/ RETROGRADES  03/03/2012   Procedure: CYSTOSCOPY WITH RETROGRADE PYELOGRAM;  Surgeon: Bernestine Amass, MD;  [facility]: [facility];  Service: Urology;  Laterality: Bilateral;  . CYSTOSCOPY W/ URETERAL STENT PLACEMENT Right 11/25/2017    Procedure: CYSTOSCOPY WITH RETROGRADE RIGHT URETERAL STENT PLACEMENT;  Surgeon: Franchot Gallo, MD;  Location: [facility];  Service: Urology;  Laterality: Right;  . CYSTOSCOPY WITH RETROGRADE PYELOGRAM, URETEROSCOPY AND STENT PLACEMENT Left 08/25/2012   Procedure: CYSTOSCOPY WITH RETROGRADE PYELOGRAM, URETEROSCOPY;  Surgeon: Bernestine Amass, MD;  [facility];  Service: Urology;  Laterality: Left;  . CYSTOSCOPY/URETEROSCOPY/HOLMIUM LASER/STENT PLACEMENT Right 12/23/2017   Procedure: RIGHT URETEROSCOPY STONE REMOVAL, HOLMIUM LASER, RIGHT /STENT EXCHANGE;  Surgeon: Ardis Hughs, MD;  [facility];  Service: Urology;  Laterality: Right;  . DILATION AND CURETTAGE OF UTERUS N/A 02/21/2013   Procedure: DILATATION AND CURETTAGE;  Surgeon: Lyman Speller, MD;  Location: [facility];  Service: Gynecology;  Laterality: N/A;  . DILATION AND CURETTAGE OF UTERUS N/A 04/09/2015   Procedure: [place] IUD removal;  Surgeon: Megan Salon, MD;  Location: [facility];  Service: Gynecology;  Laterality: N/A;  Patient weight 307lbs  . ESOPHAGOGASTRODUODENOSCOPY (EGD) WITH PROPOFOL N/A 02/02/2014   Procedure: ESOPHAGOGASTRODUODENOSCOPY (EGD) WITH PROPOFOL;  Surgeon: Irene Shipper, MD;  [facility];  Service: Endoscopy;  Laterality: N/A;  . GASTRIC BYPASS  1974  . HOLMIUM LASER APPLICATION Left 07/04/9369   Procedure: HOLMIUM LASER APPLICATION;  Surgeon: Bernestine Amass, MD;  [facility];  Service: Urology;  Laterality: Left;  . LITHOTRIPSY  03/2012  . LITHOTRIPSY  2/16  . THYROIDECTOMY  05/15/2010  . TONSILLECTOMY AND ADENOIDECTOMY    . URETEROSCOPY  03/03/2012   Procedure: URETEROSCOPY;  Surgeon: Bernestine Amass, MD;  [facility];  Service: Urology;  Laterality: Left;    Social History   Tobacco Use  . Smoking status: Former Smoker    Packs/day: 0.50    Years: 25.00    Pack years: 12.50    Types: Cigarettes    Start date: 12/24/1970    Last attempt to quit: 05/27/1995     Years since quitting: 22.6  . Smokeless tobacco: Never Used  . Tobacco comment: quit smoking 19 years ago  Substance Use Topics  . Alcohol use: Yes  Alcohol/week: 0.0 standard drinks    Comment: 1/2 glass per month  . Drug use: No    Family History  Problem Relation Age of Onset  . Hypertension Father   . Diabetes Father   . Lung cancer Father   . Heart attack Father        MI at age 27  . Hypertension Mother   . Hyperthyroidism Mother   . Heart disease Mother   . Heart attack Mother        MI at age 59  . Asthma Brother   . Hypertension Brother        younger  . Heart disease Brother        older  . Colon cancer Neg Hx   . Esophageal cancer Neg Hx   . Stomach cancer Neg Hx   . Kidney disease Neg Hx   . Liver disease Neg Hx     PE: Vitals:   01/14/18 0935 01/15/18 0710  BP:  128/72  Pulse:  80  Resp:  18  Temp:  98.6 F (37 C)  TempSrc:  Oral  SpO2:  95%  Weight: (!) 142.9 kg (!) 142.9 kg  Height: 5\' 4"  (1.626 m) 5\' 4"  (1.626 m)   Patient appears to be in no acute distress  patient is alert and oriented x3 Atraumatic normocephalic head No cervical or supraclavicular lymphadenopathy appreciated No increased work of breathing, no audible wheezes/rhonchi Regular sinus rhythm/rate Abdomen is soft, nontender, nondistended, no CVA or suprapubic tenderness Lower extremities are symmetric without appreciable edema Grossly neurologically intact No identifiable skin lesions  No results for input(s): WBC, HGB, HCT in the last 72 hours. No results for input(s): NA, K, CL, CO2, GLUCOSE, BUN, CREATININE, CALCIUM in the last 72 hours. No results for input(s): LABPT, INR in the last 72 hours. No results for input(s): LABURIN in the last 72 hours. Results for orders placed or performed during the hospital encounter of 12/21/17  Surgical pcr screen     Status: Abnormal   Collection Time: 12/21/17 11:25 AM  Result Value Ref Range Status   MRSA, PCR POSITIVE (A)  NEGATIVE Final    Comment: RESULT CALLED TO, READ BACK BY AND VERIFIED WITH: T HESTER,RN [id number] @ 1323 BY J SCOTTON    Staphylococcus aureus POSITIVE (A) NEGATIVE Final    Comment: RESULT CALLED TO, READ BACK BY AND VERIFIED WITH: T HESTER,RN [id number] @ 50 BY J SCOTTON (NOTE) The Xpert SA Assay (FDA approved for NASAL specimens in patients 109 years of age and older), is one component of a comprehensive surveillance program. It is not intended to diagnose infection nor to guide or monitor treatment. Performed at [facility], [place] [address], [place], [place] [address]     Imaging: Patient CT scan has been the above stones noted.  Imp/Recommendations: Patient has bilateral stones.  She presents today for second stage of the right ureteroscopy as well as the initial stage of her stage of the left side.  There are no significant changes to her history and physical, and thus is same to proceed with the scheduled surgery.    Louis Meckel W

## 2018-01-15 NOTE — Op Note (Signed)
Preoperative diagnosis: bilateral ureteral calculus  Postoperative diagnosis: bilateral ureteral calculus  Procedure:  1. Cystoscopy 2. bilateral ureteroscopy and stone removal 3. Ureteroscopic laser lithotripsy 4. bilateral 46F x 24 ureteral stent placement  5. bilateral retrograde pyelography with interpretation  Surgeon: Ardis Hughs, MD  Anesthesia: General  Complications: None  Intraoperative findings: Right retrograde pyelogram was performed using 10 cc of Omnipaque contrast demonstrating normal caliber ureter with small filling defect in the right upper pole consistent with the patient's residual stone fragments.  Left retrograde pyelograms performed using 10 cc of Omnipaque contrast demonstrating normal, ureter and a filling defect in the lower pole.  There are no other abnormalities.  EBL: Minimal  Specimens: 1. bilateral ureteral calculus  Disposition of specimens: [facility] for stone analysis  Indication: [name] is a 62 y.o.   patient withBilateral nephrolithiasis.  She presented to the hospital approximately 4 weeks ago with a right-sided tic obstructing stone.  She has undergone a right sided procedure, and presents today for second stage of the right and left-sided ureteroscopy. After reviewing the management options for treatment, the patient elected to proceed with the above surgical procedure(s). We have discussed the potential benefits and risks of the procedure, side effects of the proposed treatment, the likelihood of the patient achieving the goals of the procedure, and any potential problems that might occur during the procedure or recuperation. Informed consent has been obtained.   Description of procedure: Thirty-degree 21 French cystoscope was gently passed into the patient's bladder under visual guidance.  The stent emanating from the patient's right ureteral orifice was grasped and pulled to the urethral meatus.  A 0.038  sensor wires and advanced up the stent into the right renal pelvis removing stent over the wire.  I then advanced the dual lumen catheter in the distal ureter and performed retrograde pyelogram noting no significant filling defects or hydroureteronephrosis within the ureter.  There were some small filling defects in the upper pole of the right kidney consistent with the patient's residual stone fragments.  I then advanced a second wire through the dual-lumen catheter and removed the catheter over the wire.  I then advanced a 12/14 French ureteral access sheath over the second wire and into the proximal ureter.  I next advanced a flexible ureteroscope into the right renal pelvis and using the encompass basket was able to grasp all the residual stone fragments in the upper pole as well as stones in the lower pole.  One of the stones in the lower pole was too big to remove through the access sheath without lasering it first.  I used the 200  fiber with settings of 10 Hz and 1.0 J.  At the completion on the right side, there was some small residual fragments that were too small to grasp with the basket in the upper pole.  Otherwise, all the large stones had been removed and there was little in the way residual fragments.  I slowly pulled out the access sheath under visual guidance noting no significant ureteral trauma.  I then slowly advanced a 24 cm 6 French double-J ureteral stent over the wire and into the right renal pelvis under fluoroscopic guidance.  Once this was noted to be well positioned in curl in the renal pelvis I advanced it to the urethral meatus for removing the wire leaving the stent behind.  The stent tether was left on and pulled up to the urethra.  I then advanced the 21 [place]  30 cystoscope into the patient's bladder again and cannulated the patient left ureteral orifice with a 0.038 sensor wire.  I advanced this up into the renal pelvis.  I then advanced the dual lumen catheter to the distal  ureter and performed a retrograde pyelogram.  This demonstrated a normal common ureter.  There was a large filling defect in the left lower pole.  I then advanced a second wire through the dual-lumen catheter removing the catheter over the wire.  Next I advanced the inner portion of the 14/12 [place] ureteral access sheath was easily advanced up along to the proximal ureter.  I removed this and passed the inner and the outer portion of the access sheath together removing the inner portion as well as a second wire once the access sheath was well positioned in the proximal ureter.  Using the flexible ureteroscope I performed pyeloscopy noting quite a few small stone fragments in the mid pole calyx.  Using the encompass basket I was able to remove the majority of these.  I then access the lower pole stone and noted that in addition to the large stone there were lots of other little fragments.  I removed a little fragments first with the encompass basket which created some room in the lower pole for maneuverability of the ureteroscope.  Using a 200  fiber with settings of 10 Hz and 1.0 J I fragmented the stone into numerous smaller pieces.  At this point I started to remove the stone fragments using the engage basket.  However, this time it became clear that because of decreased visibility and the length of time that the patient had artery been under anesthesia that I was not completely complete the left side.  At this point I opted to perform a staged left-sided procedure and placed in stent.  This was done by advancing the 24 cm 6 French double-J stent over the wire and into the left renal pelvis under fluoroscopic guidance.  Once the stent was noted to be well within the renal pelvis I advances since there is meatus and then removed the wire leaving the stent behind.  I removed the stent tether prior to deploying the stent.  I then advanced the wire through the right stent by pulling the stent to the urethral meatus  first.  I removed the stent tether on this wound to so that the tether did not allow the right-sided stent to be in overtly removed and simultaneously removing the left side.  I placed a B&O suppository was patient's rectum.  Urethral lidocaine jelly was a slow into her urethra.  The bladder was drained.  Stents were noted to be well positioned at the final fluoroscopic spot.  The patient subsequently x-rayed and returned to the PACU in good condition.    Disposition: the patient ill be scheduled for repeat or staged ureteroscopy in 7-14 days.

## 2018-01-15 NOTE — Anesthesia Preprocedure Evaluation (Signed)
Anesthesia Evaluation  Patient identified by MRN, date of birth, ID band Patient awake    Reviewed: Allergy & Precautions, NPO status , Patient's Chart, lab work & pertinent test results  History of Anesthesia Complications (+) PONV and history of anesthetic complications  Airway Mallampati: II  TM Distance: >3 FB Neck ROM: Full    Dental no notable dental hx.    Pulmonary asthma , former smoker,    Pulmonary exam normal breath sounds clear to auscultation       Cardiovascular hypertension, Pt. on medications Normal cardiovascular exam+ dysrhythmias Atrial Fibrillation  Rhythm:Regular Rate:Normal  ECG: ST, rate 114   Neuro/Psych  Headaches, Seizures -, Well Controlled,  negative psych ROS   GI/Hepatic Neg liver ROS, GERD  Medicated and Controlled,  Endo/Other  Hypothyroidism Morbid obesity  Renal/GU negative Renal ROS     Musculoskeletal  (+) Fibromyalgia -  Abdominal (+) + obese,   Peds  Hematology negative hematology ROS (+)   Anesthesia Other Findings RIGHT URETERAL STONE Super obese  Reproductive/Obstetrics                             Anesthesia Physical  Anesthesia Plan  ASA: IV  Anesthesia Plan: General   Post-op Pain Management:    Induction: Intravenous  PONV Risk Score and Plan: 4 or greater and Ondansetron, Dexamethasone, Midazolam and Scopolamine patch - Pre-op  Airway Management Planned: LMA  Additional Equipment:   Intra-op Plan:   Post-operative Plan:   Informed Consent: I have reviewed the patients History and Physical, chart, labs and discussed the procedure including the risks, benefits and alternatives for the proposed anesthesia with the patient or authorized representative who has indicated his/her understanding and acceptance.   Dental advisory given  Plan Discussed with: CRNA  Anesthesia Plan Comments:         Anesthesia Quick  Evaluation

## 2018-01-15 NOTE — Anesthesia Postprocedure Evaluation (Signed)
Anesthesia Post Note  Patient: [name]  Procedure(s) Performed: CYSTOSCOPY/BILATERAL URETEROSCOPY WITH HOLMIUM LASER LITHOTRIPSY STONE REMOVAL (Bilateral ) RIGHT STENT EXCHANGE (Right ) LEFT STENT PLACEMENT (Left )     Patient location during evaluation: PACU Anesthesia Type: General Level of consciousness: awake and alert Pain management: pain level controlled Vital Signs Assessment: post-procedure vital signs reviewed and stable Respiratory status: spontaneous breathing, nonlabored ventilation, respiratory function stable and patient connected to nasal cannula oxygen Cardiovascular status: blood pressure returned to baseline and stable Postop Assessment: no apparent nausea or vomiting Anesthetic complications: no    Last Vitals:  Vitals:   01/15/18 1253 01/15/18 1415  BP: 99/77 132/69  Pulse: 76 78  Resp: 19 20  Temp:  36.9 C  SpO2: 96% 98%    Last Pain:  Vitals:   01/15/18 1415  TempSrc:   PainSc: 0-No pain                 Montez Hageman

## 2018-01-15 NOTE — Anesthesia Procedure Notes (Signed)
Procedure Name: LMA Insertion Date/Time: 01/15/2018 9:06 AM Performed by: Claudia Desanctis, CRNA Pre-anesthesia Checklist: Emergency Drugs available, Patient identified, Suction available and Patient being monitored Patient Re-evaluated:Patient Re-evaluated prior to induction Oxygen Delivery Method: Circle system utilized Preoxygenation: Pre-oxygenation with 100% oxygen Induction Type: IV induction Ventilation: Mask ventilation without difficulty LMA: LMA inserted and LMA with gastric port inserted LMA Size: 4.0 Number of attempts: 1 Placement Confirmation: positive ETCO2 and breath sounds checked- equal and bilateral Tube secured with: Tape Dental Injury: Teeth and Oropharynx as per pre-operative assessment

## 2018-01-15 NOTE — H&P (View-Only) (Signed)
History of present illness: 62-year-old female presents today for follow-up ureteroscopy.  She was initially admitted to the hospital 4 weeks prior with infected stone on the right side.  She underwent stent placement subsequent antibiotics.  She then presented to the operating room and her stones were removed.  The time we also place a stent on the left side and make plans to return today for a staged procedure to remove the remaining stones in her right and attempt at removing all the stent on the left side simultaneously.  Since the patient's last procedure she is done well.  She has had some mild hematuria.  She denies any dysuria.  She denies any fevers or chills.  Her pain is been well controlled.  The patient was given Bactrim double strength twice yesterday as preventative measures to reduce the risk of developing urosepsis from today's procedure.  Review of systems: A 12 point comprehensive review of systems was obtained and is negative unless otherwise stated in the history of present illness.  Patient Active Problem List   Diagnosis Date Noted  . Sepsis due to Escherichia coli (E. coli) ([place]) 11/28/2017  . E coli bacteremia 11/28/2017  . Sepsis ([place]) 11/24/2017  . Hypokalemia 12/10/2016  . Hypocalcemia 12/10/2016  . Recurrent falls 12/09/2016  . Right ureteral stone 07/17/2014  . Osteomyelitis ([place]) 06/14/2014  . Pernicious anemia 02/20/2014  . Reflux esophagitis 02/02/2014  . Edema 11/23/2013  . Foot pain 03/31/2013  . Endometrial adenocarcinoma ([place]) 02/21/2013  . Trochanteric bursitis of left hip 09/13/2012  . Muscle cramps 08/12/2012  . Fatty liver 01/07/2012  . Mid back pain 09/30/2011  . Weight gain 07/02/2011  . Nonspecific abnormal unspecified cardiovascular function study 04/30/2011  . Hx of papillary thyroid carcinoma 04/07/2011  . Low back pain 04/01/2011  . Atrial fibrillation ([place]) 02/05/2011  . Hypoparathyroidism ([place]) 01/07/2011  . GERD 05/22/2009  .  Leukocytosis 03/24/2009  . OBESITY, MORBID 12/20/2008  . RHINOSINUSITIS, RECURRENT 09/07/2008  . Vitamin D deficiency 05/10/2007  . Iron deficiency anemia secondary to malabsorption  05/10/2007  . Hypothyroidism 01/05/2007  . Essential hypertension 01/05/2007  . ALLERGIC RHINITIS 01/05/2007  . Asthma 01/05/2007    No current facility-administered medications on file prior to encounter.    Current Outpatient Medications on File Prior to Encounter  Medication Sig Dispense Refill  . acetaminophen (TYLENOL) 325 MG tablet Take 2 tablets (650 mg total) by mouth every 6 (six) hours as needed for mild pain or moderate pain. 60 tablet 0  . albuterol (PROVENTIL HFA;VENTOLIN HFA) 108 (90 Base) MCG/ACT inhaler Inhale 2 puffs into the lungs every 6 (six) hours as needed for wheezing or shortness of breath. 1 Inhaler 5  . apixaban (ELIQUIS) 5 MG TABS tablet Take 5 mg by mouth 2 (two) times daily.    . calcitRIOL (ROCALTROL) 0.5 MCG capsule Take 2 mcg by mouth daily.     . calcium carbonate (TUMS - DOSED IN MG ELEMENTAL CALCIUM) 500 MG chewable tablet Chew 2 tablets (400 mg of elemental calcium total) by mouth 4 (four) times daily. (Patient taking differently: Chew 2 tablets by mouth 3 (three) times daily. )    . citric acid-potassium citrate (POLYCITRA) 1100-334 MG/5ML solution Take 1 mL by mouth 2 (two) times daily.     . Cyanocobalamin (VITAMIN B-12 IJ) Inject 1,000 mcg as directed every 30 (thirty) days.     . diphenhydrAMINE (BENADRYL) 25 mg capsule Take 25 mg by mouth every 6 (six) hours as needed for  itching.     . fluticasone (FLONASE) 50 MCG/ACT nasal spray Place 1 spray into both nostrils daily.     . furosemide (LASIX) 40 MG tablet 2 tabs by mouth daily in the AM and 1 tablet in the PM (Patient taking differently: Take 80 mg by mouth 2 (two) times daily. ) 270 tablet 1  . HYDROcodone-acetaminophen (NORCO/VICODIN) 5-325 MG tablet Take 1-2 tablets by mouth every 6 (six) hours as needed. (Patient  taking differently: Take 1-2 tablets by mouth every 6 (six) hours as needed. Out of medication x 7 days) 15 tablet 0  . levocetirizine (XYZAL) 5 MG tablet TAKE 1 TABLET BY MOUTH  EVERY EVENING 90 tablet 3  . levothyroxine (SYNTHROID, LEVOTHROID) 100 MCG tablet Take 350 mcg by mouth daily.     [name] loratadine (CLARITIN) 10 MG tablet Take 10 mg by mouth every morning.    . Magnesium 125 MG CAPS Take 125 mg by mouth 2 (two) times daily.     . Melatonin 5 MG TABS Take 5 mg by mouth at bedtime.    . mometasone (ELOCON) 0.1 % ointment Apply 1 application topically 2 (two) times daily.    [name] omeprazole (PRILOSEC) 40 MG capsule TAKE 1 CAPSULE BY MOUTH  DAILY 90 capsule 1  . saccharomyces boulardii (FLORASTOR) 250 MG capsule Take 1 capsule (250 mg total) by mouth 2 (two) times daily. (Patient taking differently: Take 250 mg by mouth daily as needed (regularity). ) 30 capsule 0  . SYMBICORT 160-4.5 MCG/ACT inhaler INHALE 2 PUFFS TWO TIMES  DAILY (Patient taking differently: Inhale 2 puffs into the lungs 2 (two) times daily. ) 30.6 g 1  . Vitamin D, Ergocalciferol, (DRISDOL) 50000 units CAPS capsule TAKE 1 CAPSULE BY MOUTH 3  TIMES WEEKLY (Patient taking differently: Take 50,000 Units by mouth every Monday, Wednesday, and Friday. ) 39 capsule 1  . zafirlukast (ACCOLATE) 20 MG tablet TAKE 1 TABLET BY MOUTH TWO  TIMES DAILY BEFORE MEALS (Patient taking differently: Take 20 mg by mouth 2 (two) times daily before a meal. ) 180 tablet 1  . desoximetasone (TOPICORT) 0.05 % cream Apply topically 2 (two) times daily as needed. 60 g 0  . nystatin (MYCOSTATIN/NYSTOP) powder Apply topically 3 (three) times daily. 15 g 0  . phenazopyridine (PYRIDIUM) 200 MG tablet Take 1 tablet (200 mg total) by mouth 3 (three) times daily as needed for pain. 20 tablet 0    Past Medical History:  Diagnosis Date  . Abnormal Pap smear    years ago/no biopsy  . Allergic rhinitis   . Anemia, iron deficiency   . Aortic atherosclerosis ([place])    . Arthritis    back- lower  . Asthma   . Atrial fibrillation ([place]) August 2012   OFF XARELTO LAST MONTH DUE TO BLEEDING IN STOOL  . Bursitis of hip left  . Chronic diastolic (congestive) heart failure ([place])   . Dysrhythmia    afib, followed by Dr. Stanford Breed   . Edema of both legs   . Fatty liver 01/07/2012  . Fatty liver   . Fibroid 1974   fibroid cyst on left fallopian tube  . Fibromyalgia   . Foot ulcer ([place])    AREA HEALED RIGHT FOOT  . GERD (gastroesophageal reflux disease)   . Headache(784.0)    occasional sinus headache   . Hepatomegaly   . History of blood transfusion JULY 2015  . History of E. coli septicemia   . History of MRSA infection   .  Hypertension   . Hypoparathyroidism ([place]) 01/07/2011  . Hypothyroidism   . Kidney stone 2015/10/16   passed on their own  . Morbid obesity ([place])   . Nephrolithiasis 2/16, 9/16   SEES DR Risa Grill  . Pernicious anemia 02/20/2014   followed by Debbrah Alar  . PONV (postoperative nausea and vomiting)   . Rash    thighs and back  . Seizures ([place])    infancy secondary to fever  . Thyroid cancer ([place]) 2011   THYROIDECTOMY DONE  . Uterine cancer ([place]) 2014   Mirena IUD  . Vitamin B 12 deficiency   . Vitamin D deficiency     Past Surgical History:  Procedure Laterality Date  . AMPUTATION Right 05/18/2014   Procedure: RIGHT FIFTH RAY AMPUTATION FOOT;  Surgeon: Wylene Simmer, MD;  Location: [facility];  Service: Orthopedics;  Laterality: Right;  . APPENDECTOMY    . BUNIONECTOMY     bilateral  . COLONOSCOPY WITH PROPOFOL N/A 02/02/2014   Procedure: COLONOSCOPY WITH PROPOFOL;  Surgeon: Irene Shipper, MD;  [facility]: [facility];  Service: Endoscopy;  Laterality: N/A;  . cyst on ovary removed     . CYSTOSCOPY W/ RETROGRADES  03/03/2012   Procedure: CYSTOSCOPY WITH RETROGRADE PYELOGRAM;  Surgeon: Bernestine Amass, MD;  Location: [facility];  Service: Urology;  Laterality: Bilateral;  . CYSTOSCOPY W/ URETERAL STENT PLACEMENT Right 11/25/2017    Procedure: CYSTOSCOPY WITH RETROGRADE RIGHT URETERAL STENT PLACEMENT;  Surgeon: Franchot Gallo, MD;  Location: [facility];  Service: Urology;  Laterality: Right;  . CYSTOSCOPY WITH RETROGRADE PYELOGRAM, URETEROSCOPY AND STENT PLACEMENT Left 08/25/2012   Procedure: CYSTOSCOPY WITH RETROGRADE PYELOGRAM, URETEROSCOPY;  Surgeon: Bernestine Amass, MD;  [facility];  Service: Urology;  Laterality: Left;  . CYSTOSCOPY/URETEROSCOPY/HOLMIUM LASER/STENT PLACEMENT Right 12/23/2017   Procedure: RIGHT URETEROSCOPY STONE REMOVAL, HOLMIUM LASER, RIGHT /STENT EXCHANGE;  Surgeon: Ardis Hughs, MD;  [facility];  Service: Urology;  Laterality: Right;  . DILATION AND CURETTAGE OF UTERUS N/A 02/21/2013   Procedure: DILATATION AND CURETTAGE;  Surgeon: Lyman Speller, MD;  Location: [facility];  Service: Gynecology;  Laterality: N/A;  . DILATION AND CURETTAGE OF UTERUS N/A 04/09/2015   Procedure: [place] IUD removal;  Surgeon: Megan Salon, MD;  Location: [facility];  Service: Gynecology;  Laterality: N/A;  Patient weight 307lbs  . ESOPHAGOGASTRODUODENOSCOPY (EGD) WITH PROPOFOL N/A 02/02/2014   Procedure: ESOPHAGOGASTRODUODENOSCOPY (EGD) WITH PROPOFOL;  Surgeon: Irene Shipper, MD;  [facility];  Service: Endoscopy;  Laterality: N/A;  . GASTRIC BYPASS  1974  . HOLMIUM LASER APPLICATION Left 05/31/9676   Procedure: HOLMIUM LASER APPLICATION;  Surgeon: Bernestine Amass, MD;  [facility];  Service: Urology;  Laterality: Left;  . LITHOTRIPSY  03/2012  . LITHOTRIPSY  2/16  . THYROIDECTOMY  05/15/2010  . TONSILLECTOMY AND ADENOIDECTOMY    . URETEROSCOPY  03/03/2012   Procedure: URETEROSCOPY;  Surgeon: Bernestine Amass, MD;  [facility];  Service: Urology;  Laterality: Left;    Social History   Tobacco Use  . Smoking status: Former Smoker    Packs/day: 0.50    Years: 25.00    Pack years: 12.50    Types: Cigarettes    Start date: 12/24/1970    Last attempt to quit: 05/27/1995     Years since quitting: 22.6  . Smokeless tobacco: Never Used  . Tobacco comment: quit smoking 19 years ago  Substance Use Topics  . Alcohol use: Yes  Alcohol/week: 0.0 standard drinks    Comment: 1/2 glass per month  . Drug use: No    Family History  Problem Relation Age of Onset  . Hypertension Father   . Diabetes Father   . Lung cancer Father   . Heart attack Father        MI at age 13  . Hypertension Mother   . Hyperthyroidism Mother   . Heart disease Mother   . Heart attack Mother        MI at age 79  . Asthma Brother   . Hypertension Brother        younger  . Heart disease Brother        older  . Colon cancer Neg Hx   . Esophageal cancer Neg Hx   . Stomach cancer Neg Hx   . Kidney disease Neg Hx   . Liver disease Neg Hx     PE: Vitals:   01/14/18 0935 01/15/18 0710  BP:  128/72  Pulse:  80  Resp:  18  Temp:  98.6 F (37 C)  TempSrc:  Oral  SpO2:  95%  Weight: (!) 142.9 kg (!) 142.9 kg  Height: 5\' 4"  (1.626 m) 5\' 4"  (1.626 m)   Patient appears to be in no acute distress  patient is alert and oriented x3 Atraumatic normocephalic head No cervical or supraclavicular lymphadenopathy appreciated No increased work of breathing, no audible wheezes/rhonchi Regular sinus rhythm/rate Abdomen is soft, nontender, nondistended, no CVA or suprapubic tenderness Lower extremities are symmetric without appreciable edema Grossly neurologically intact No identifiable skin lesions  No results for input(s): WBC, HGB, HCT in the last 72 hours. No results for input(s): NA, K, CL, CO2, GLUCOSE, BUN, CREATININE, CALCIUM in the last 72 hours. No results for input(s): LABPT, INR in the last 72 hours. No results for input(s): LABURIN in the last 72 hours. Results for orders placed or performed during the hospital encounter of 12/21/17  Surgical pcr screen     Status: Abnormal   Collection Time: 12/21/17 11:25 AM  Result Value Ref Range Status   MRSA, PCR POSITIVE (A)  NEGATIVE Final    Comment: RESULT CALLED TO, READ BACK BY AND VERIFIED WITH: T HESTER,RN [id number] @ 1323 BY J SCOTTON    Staphylococcus aureus POSITIVE (A) NEGATIVE Final    Comment: RESULT CALLED TO, READ BACK BY AND VERIFIED WITH: T HESTER,RN [id number] @ 42 BY J SCOTTON (NOTE) The Xpert SA Assay (FDA approved for NASAL specimens in patients 100 years of age and older), is one component of a comprehensive surveillance program. It is not intended to diagnose infection nor to guide or monitor treatment. Performed at [facility], [place] [address] [place], [place] [address]     Imaging: Patient CT scan has been the above stones noted.  Imp/Recommendations: Patient has bilateral stones.  She presents today for second stage of the right ureteroscopy as well as the initial stage of her stage of the left side.  There are no significant changes to her history and physical, and thus is same to proceed with the scheduled surgery.    Louis Meckel W

## 2018-01-15 NOTE — Discharge Instructions (Signed)
DISCHARGE INSTRUCTIONS FOR KIDNEY STONE/URETERAL STENT   MEDICATIONS:  1.  Resume all your other meds from home - except do not take any extra narcotic pain meds that you may have at home.  2. Pyridium is to help with the burning/stinging when you urinate. 3. Tramadol is for moderate/severe pain, otherwise taking upto 1000 mg every 6 hours of plainTylenol will help treat your pain.   4. Take Bactrim DS starting the day prior to next procedure.   ACTIVITY:  1. No strenuous activity x 1week  2. No driving while on narcotic pain medications  3. Drink plenty of water  4. Continue to walk at home - you can still get blood clots when you are at home, so keep active, but don't over do it.  5. May return to work/school tomorrow or when you feel ready   BATHING:  1. You can shower and we recommend daily showers   SIGNS/SYMPTOMS TO CALL:  Please call us if you have a fever greater than 101.5, uncontrolled nausea/vomiting, uncontrolled pain, dizziness, unable to urinate, bloody urine, chest pain, shortness of breath, leg swelling, leg pain, redness around wound, drainage from wound, or any other concerns or questions.   You can reach [place] at [phone].   FOLLOW-UP:  1. We will schedule you for a repeat procedure in the next 10-14 days.

## 2018-01-15 NOTE — Transfer of Care (Signed)
Immediate Anesthesia Transfer of Care Note  Patient: [name]  Procedure(s) Performed: CYSTOSCOPY/BILATERAL URETEROSCOPY WITH HOLMIUM LASER LITHOTRIPSY STONE REMOVAL (Bilateral ) RIGHT STENT EXCHANGE (Right ) LEFT STENT PLACEMENT (Left )  Patient Location: PACU  Anesthesia Type:General  Level of Consciousness: awake, alert , oriented and patient cooperative  Airway & Oxygen Therapy: Patient Spontanous Breathing and Patient connected to face mask  Post-op Assessment: Report given to RN and Post -op Vital signs reviewed and stable  Post vital signs: Reviewed and stable  Last Vitals:  Vitals Value Taken Time  BP 124/68 01/15/2018 11:53 AM  Temp    Pulse 92 01/15/2018 11:54 AM  Resp 15 01/15/2018 11:54 AM  SpO2 98 % 01/15/2018 11:54 AM  Vitals shown include unvalidated device data.  Last Pain:  Vitals:   01/15/18 0749  TempSrc:   PainSc: 0-No pain      Patients Stated Pain Goal: 3 (37/48/27 0786)  Complications: No apparent anesthesia complications

## 2018-01-16 ENCOUNTER — Encounter (HOSPITAL_COMMUNITY): Payer: Self-pay | Admitting: Urology

## 2018-01-18 ENCOUNTER — Ambulatory Visit: Payer: 59 | Admitting: Family

## 2018-01-18 ENCOUNTER — Other Ambulatory Visit: Payer: Self-pay | Admitting: Urology

## 2018-01-19 ENCOUNTER — Encounter (HOSPITAL_COMMUNITY): Payer: Self-pay | Admitting: *Deleted

## 2018-01-19 ENCOUNTER — Other Ambulatory Visit: Payer: Self-pay

## 2018-01-21 MED ORDER — GENTAMICIN SULFATE 40 MG/ML IJ SOLN
5.0000 mg/kg | INTRAVENOUS | Status: AC
  Administered 2018-01-22: 450 mg via INTRAVENOUS
  Filled 2018-01-21: qty 11.25

## 2018-01-22 ENCOUNTER — Ambulatory Visit (HOSPITAL_COMMUNITY): Payer: 59

## 2018-01-22 ENCOUNTER — Encounter (HOSPITAL_COMMUNITY): Payer: Self-pay

## 2018-01-22 ENCOUNTER — Ambulatory Visit (HOSPITAL_COMMUNITY): Payer: 59 | Admitting: Anesthesiology

## 2018-01-22 ENCOUNTER — Encounter (HOSPITAL_COMMUNITY)
Admission: RE | Disposition: A | Discharge status: Home / Self Care | Payer: Self-pay | Source: Ambulatory Visit | Attending: Urology

## 2018-01-22 ENCOUNTER — Ambulatory Visit (HOSPITAL_COMMUNITY)
Admission: RE | Admit: 2018-01-22 | Discharge: 2018-01-22 | Disposition: A | Discharge status: Home / Self Care | Payer: 59 | Source: Ambulatory Visit | Attending: Urology | Admitting: Urology

## 2018-01-22 DIAGNOSIS — N201 Calculus of ureter: Secondary | ICD-10-CM | POA: Diagnosis not present

## 2018-01-22 DIAGNOSIS — I4891 Unspecified atrial fibrillation: Secondary | ICD-10-CM | POA: Insufficient documentation

## 2018-01-22 DIAGNOSIS — Z7951 Long term (current) use of inhaled steroids: Secondary | ICD-10-CM | POA: Diagnosis not present

## 2018-01-22 DIAGNOSIS — J45909 Unspecified asthma, uncomplicated: Secondary | ICD-10-CM | POA: Diagnosis not present

## 2018-01-22 DIAGNOSIS — Z7901 Long term (current) use of anticoagulants: Secondary | ICD-10-CM | POA: Diagnosis not present

## 2018-01-22 DIAGNOSIS — E039 Hypothyroidism, unspecified: Secondary | ICD-10-CM | POA: Insufficient documentation

## 2018-01-22 DIAGNOSIS — Z8619 Personal history of other infectious and parasitic diseases: Secondary | ICD-10-CM | POA: Diagnosis not present

## 2018-01-22 DIAGNOSIS — N200 Calculus of kidney: Secondary | ICD-10-CM | POA: Diagnosis not present

## 2018-01-22 DIAGNOSIS — Z79890 Hormone replacement therapy (postmenopausal): Secondary | ICD-10-CM | POA: Insufficient documentation

## 2018-01-22 DIAGNOSIS — Z6841 Body Mass Index (BMI) 40.0 and over, adult: Secondary | ICD-10-CM | POA: Insufficient documentation

## 2018-01-22 DIAGNOSIS — I10 Essential (primary) hypertension: Secondary | ICD-10-CM | POA: Insufficient documentation

## 2018-01-22 DIAGNOSIS — Z87891 Personal history of nicotine dependence: Secondary | ICD-10-CM | POA: Diagnosis not present

## 2018-01-22 HISTORY — PX: CYSTOSCOPY/URETEROSCOPY/HOLMIUM LASER/STENT PLACEMENT: SHX6546

## 2018-01-22 HISTORY — PX: CYSTOSCOPY WITH STENT PLACEMENT: SHX5790

## 2018-01-22 LAB — BASIC METABOLIC PANEL
ANION GAP: 17 — AB (ref 5–15)
BUN: 17 mg/dL (ref 6–20)
CALCIUM: 8.2 mg/dL — AB (ref 8.9–10.3)
CO2: 29 mmol/L (ref 22–32)
Chloride: 95 mmol/L — ABNORMAL LOW (ref 98–111)
Creatinine, Ser: 0.95 mg/dL (ref 0.44–1.00)
GFR calc Af Amer: >60 mL/min (ref >60)
Glucose, Bld: 115 mg/dL — ABNORMAL HIGH (ref 70–99)
POTASSIUM: 3.6 mmol/L (ref 3.5–5.1)
Sodium: 141 mmol/L (ref 135–145)

## 2018-01-22 LAB — CBC
HCT: 47.2 % — ABNORMAL HIGH (ref 36.0–46.0)
Hemoglobin: 14.9 g/dL (ref 12.0–15.0)
MCH: 28.4 pg (ref 26.0–34.0)
MCHC: 31.6 g/dL (ref 30.0–36.0)
MCV: 90.1 fL (ref 78.0–100.0)
Platelets: 191 10*3/uL (ref 150–400)
RBC: 5.24 MIL/uL — AB (ref 3.87–5.11)
RDW: 14.6 % (ref 11.5–15.5)
WBC: 12.4 10*3/uL — ABNORMAL HIGH (ref 4.0–10.5)

## 2018-01-22 LAB — SURGICAL PCR SCREEN
MRSA, PCR: POSITIVE — AB
STAPHYLOCOCCUS AUREUS: POSITIVE — AB

## 2018-01-22 SURGERY — CYSTOSCOPY/URETEROSCOPY/HOLMIUM LASER/STENT PLACEMENT
Anesthesia: General | Laterality: Right

## 2018-01-22 MED ORDER — MEPERIDINE HCL 50 MG/ML IJ SOLN: 6.2500 mg | INTRAMUSCULAR | Status: DC | PRN

## 2018-01-22 MED ORDER — MIDAZOLAM HCL 2 MG/2ML IJ SOLN
INTRAMUSCULAR | Status: AC
  Filled 2018-01-22: qty 2

## 2018-01-22 MED ORDER — OXYCODONE HCL 5 MG PO TABS: 5.0000 mg | ORAL_TABLET | Freq: Once | ORAL | Status: DC | PRN

## 2018-01-22 MED ORDER — ONDANSETRON HCL 4 MG/2ML IJ SOLN
INTRAMUSCULAR | Status: AC
  Filled 2018-01-22: qty 2

## 2018-01-22 MED ORDER — FENTANYL CITRATE (PF) 100 MCG/2ML IJ SOLN
INTRAMUSCULAR | Status: DC | PRN
  Administered 2018-01-22: 50 ug via INTRAVENOUS
  Administered 2018-01-22 (×4): 25 ug via INTRAVENOUS
  Administered 2018-01-22: 75 ug via INTRAVENOUS
  Administered 2018-01-22: 25 ug via INTRAVENOUS

## 2018-01-22 MED ORDER — LACTATED RINGERS IV SOLN
INTRAVENOUS | Status: DC
  Administered 2018-01-22: 07:00:00 via INTRAVENOUS

## 2018-01-22 MED ORDER — FENTANYL CITRATE (PF) 100 MCG/2ML IJ SOLN: 25.0000 ug | INTRAMUSCULAR | Status: DC | PRN

## 2018-01-22 MED ORDER — SCOPOLAMINE 1 MG/3DAYS TD PT72
MEDICATED_PATCH | TRANSDERMAL | Status: AC
  Filled 2018-01-22: qty 1

## 2018-01-22 MED ORDER — SODIUM CHLORIDE 0.9 % IR SOLN
Status: DC | PRN
  Administered 2018-01-22: 6000 mL

## 2018-01-22 MED ORDER — ACETAMINOPHEN 325 MG PO TABS
ORAL_TABLET | ORAL | Status: AC
  Filled 2018-01-22: qty 2

## 2018-01-22 MED ORDER — ACETAMINOPHEN 325 MG PO TABS
325.0000 mg | ORAL_TABLET | ORAL | Status: DC | PRN
  Administered 2018-01-22: 650 mg via ORAL

## 2018-01-22 MED ORDER — ONDANSETRON HCL 4 MG/2ML IJ SOLN: 4.0000 mg | Freq: Once | INTRAMUSCULAR | Status: DC | PRN

## 2018-01-22 MED ORDER — DEXAMETHASONE SODIUM PHOSPHATE 10 MG/ML IJ SOLN
INTRAMUSCULAR | Status: DC | PRN
  Administered 2018-01-22: 10 mg via INTRAVENOUS

## 2018-01-22 MED ORDER — OXYCODONE HCL 5 MG/5ML PO SOLN
5.0000 mg | Freq: Once | ORAL | Status: DC | PRN
  Filled 2018-01-22: qty 5

## 2018-01-22 MED ORDER — IOHEXOL 300 MG/ML  SOLN
INTRAMUSCULAR | Status: DC | PRN
  Administered 2018-01-22: 25 mL

## 2018-01-22 MED ORDER — KETOROLAC TROMETHAMINE 30 MG/ML IJ SOLN
INTRAMUSCULAR | Status: DC | PRN
  Administered 2018-01-22: 30 mg via INTRAVENOUS

## 2018-01-22 MED ORDER — PROPOFOL 10 MG/ML IV BOLUS
INTRAVENOUS | Status: AC
  Filled 2018-01-22: qty 20

## 2018-01-22 MED ORDER — BELLADONNA ALKALOIDS-OPIUM 16.2-60 MG RE SUPP
RECTAL | Status: DC | PRN
  Administered 2018-01-22: 1 via RECTAL

## 2018-01-22 MED ORDER — BELLADONNA ALKALOIDS-OPIUM 16.2-30 MG RE SUPP
RECTAL | Status: AC
  Filled 2018-01-22: qty 1

## 2018-01-22 MED ORDER — ONDANSETRON HCL 4 MG/2ML IJ SOLN
INTRAMUSCULAR | Status: DC | PRN
  Administered 2018-01-22: 4 mg via INTRAVENOUS

## 2018-01-22 MED ORDER — PROPOFOL 10 MG/ML IV BOLUS
INTRAVENOUS | Status: DC | PRN
  Administered 2018-01-22: 200 mg via INTRAVENOUS

## 2018-01-22 MED ORDER — FENTANYL CITRATE (PF) 250 MCG/5ML IJ SOLN
INTRAMUSCULAR | Status: AC
  Filled 2018-01-22: qty 5

## 2018-01-22 MED ORDER — MIDAZOLAM HCL 5 MG/5ML IJ SOLN
INTRAMUSCULAR | Status: DC | PRN
  Administered 2018-01-22: 2 mg via INTRAVENOUS

## 2018-01-22 MED ORDER — SCOPOLAMINE 1 MG/3DAYS TD PT72
MEDICATED_PATCH | TRANSDERMAL | Status: DC | PRN
  Administered 2018-01-22: 1 via TRANSDERMAL

## 2018-01-22 MED ORDER — LIDOCAINE HCL (CARDIAC) PF 100 MG/5ML IV SOSY
PREFILLED_SYRINGE | INTRAVENOUS | Status: DC | PRN
  Administered 2018-01-22: 100 mg via INTRAVENOUS

## 2018-01-22 MED ORDER — HYDROCODONE-ACETAMINOPHEN 5-325 MG PO TABS: 1.0000 | ORAL_TABLET | Freq: Four times a day (QID) | ORAL | 0 refills | Status: DC | PRN

## 2018-01-22 MED ORDER — ACETAMINOPHEN 160 MG/5ML PO SOLN: 325.0000 mg | ORAL | Status: DC | PRN

## 2018-01-22 MED ORDER — KETOROLAC TROMETHAMINE 10 MG PO TABS: 10.0000 mg | ORAL_TABLET | Freq: Four times a day (QID) | ORAL | 0 refills | Status: AC

## 2018-01-22 SURGICAL SUPPLY — 23 items
BAG URO CATCHER STRL LF (MISCELLANEOUS) ×3 IMPLANT
BASKET STONE NCOMPASS (UROLOGICAL SUPPLIES) ×1 IMPLANT
BASKET ZERO TIP NITINOL 2.4FR (BASKET) IMPLANT
BSKT STON RTRVL ZERO TP 2.4FR (BASKET)
CATH URET 5FR 28IN OPEN ENDED (CATHETERS) ×3 IMPLANT
CATH URET DUAL LUMEN 6-10FR 50 (CATHETERS) ×2 IMPLANT
CLOTH BEACON ORANGE TIMEOUT ST (SAFETY) ×3 IMPLANT
EXTRACTOR STONE 1.7FRX115CM (UROLOGICAL SUPPLIES) IMPLANT
EXTRACTOR STONE NITINOL NGAGE (UROLOGICAL SUPPLIES) ×1 IMPLANT
FIBER LASER TRAC TIP (UROLOGICAL SUPPLIES) ×1 IMPLANT
GLOVE BIOGEL M STRL SZ7.5 (GLOVE) ×3 IMPLANT
GOWN STRL REUS W/TWL LRG LVL3 (GOWN DISPOSABLE) ×6 IMPLANT
GOWN STRL REUS W/TWL XL LVL3 (GOWN DISPOSABLE) ×3 IMPLANT
GUIDEWIRE ANG ZIPWIRE 038X150 (WIRE) IMPLANT
GUIDEWIRE STR DUAL SENSOR (WIRE) ×4 IMPLANT
MANIFOLD NEPTUNE II (INSTRUMENTS) ×3 IMPLANT
PACK CYSTO (CUSTOM PROCEDURE TRAY) ×3 IMPLANT
SHEATH URETERAL 12FRX28CM (UROLOGICAL SUPPLIES) IMPLANT
SHEATH URETERAL 12FRX35CM (MISCELLANEOUS) ×1 IMPLANT
STENT CONTOUR 6FRX24X.038 (STENTS) ×2 IMPLANT
TUBING CONNECTING 10 (TUBING) ×3 IMPLANT
TUBING UROLOGY SET (TUBING) ×3 IMPLANT
WIRE COONS/BENSON .038X145CM (WIRE) IMPLANT

## 2018-01-22 NOTE — Anesthesia Postprocedure Evaluation (Signed)
Anesthesia Post Note  Patient: [name]  Procedure(s) Performed: LEFT URETEROSCOPY STONE REMOVAL HOLMIUM LASER LEFT STENT EXCHANGE,BILATERAL RETROGRADES (Left ) RIGHT STENT REMOVAL (Right )     Patient location during evaluation: PACU Anesthesia Type: General Level of consciousness: awake Pain management: pain level controlled Vital Signs Assessment: post-procedure vital signs reviewed and stable Respiratory status: spontaneous breathing Cardiovascular status: stable Postop Assessment: no apparent nausea or vomiting Anesthetic complications: no    Last Vitals:  Vitals:   01/22/18 1030 01/22/18 1045  BP: (!) 110/56 (!) 105/55  Pulse: 78 77  Resp: 16 20  Temp:    SpO2: 92% 93%    Last Pain:  Vitals:   01/22/18 1045  TempSrc:   PainSc: 2    Pain Goal:                 Ashaad Gaertner JR,JOHN [place]won Fosdick

## 2018-01-22 NOTE — Interval H&P Note (Signed)
History and Physical Interval Note:  01/22/2018 7:31 AM  [name]  has presented today for surgery, with the diagnosis of bilateral ureteral stone  The various methods of treatment have been discussed with the patient and family. After consideration of risks, benefits and other options for treatment, the patient has consented to  Procedure(s): LEFT URETEROSCOPY STONE REMOVAL HOLMIUM LASER LEFT STENT EXCHANGE (Left) RIGHT STENT REMOVAL (Right) as a surgical intervention .  The patient's history has been reviewed, patient examined, no change in status, stable for surgery.  I have reviewed the patient's chart and labs.  Questions were answered to the patient's satisfaction.     Louis Meckel W

## 2018-01-22 NOTE — Anesthesia Preprocedure Evaluation (Addendum)
Anesthesia Evaluation  Patient identified by MRN, date of birth, ID band Patient awake    Reviewed: Allergy & Precautions, NPO status , Patient's Chart, lab work & pertinent test results  History of Anesthesia Complications (+) PONV and history of anesthetic complications  Airway Mallampati: II  TM Distance: >3 FB Neck ROM: Full    Dental no notable dental hx.    Pulmonary asthma , former smoker,    Pulmonary exam normal breath sounds clear to auscultation       Cardiovascular hypertension, Pt. on medications Normal cardiovascular exam+ dysrhythmias Atrial Fibrillation  Rhythm:Regular Rate:Normal  ECG: ST, rate 114   Neuro/Psych  Headaches, Seizures -, Well Controlled,  negative psych ROS   GI/Hepatic Neg liver ROS, GERD  Medicated and Controlled,  Endo/Other  Hypothyroidism Morbid obesity  Renal/GU      Musculoskeletal  (+) Fibromyalgia -  Abdominal (+) + obese,   Peds  Hematology   Anesthesia Other Findings RIGHT URETERAL STONE Super obese  Reproductive/Obstetrics                             Anesthesia Physical  Anesthesia Plan  ASA: III  Anesthesia Plan: General   Post-op Pain Management:    Induction: Intravenous  PONV Risk Score and Plan: 4 or greater and Ondansetron, Dexamethasone, Midazolam and Scopolamine patch - Pre-op  Airway Management Planned: LMA  Additional Equipment:   Intra-op Plan:   Post-operative Plan:   Informed Consent: I have reviewed the patients History and Physical, chart, labs and discussed the procedure including the risks, benefits and alternatives for the proposed anesthesia with the patient or authorized representative who has indicated his/her understanding and acceptance.   Dental advisory given  Plan Discussed with: CRNA  Anesthesia Plan Comments:         Anesthesia Quick Evaluation

## 2018-01-22 NOTE — Anesthesia Procedure Notes (Signed)
Procedure Name: LMA Insertion Date/Time: 01/22/2018 7:46 AM Performed by: Glory Buff, CRNA Pre-anesthesia Checklist: Emergency Drugs available, Patient identified, Suction available and Patient being monitored Patient Re-evaluated:Patient Re-evaluated prior to induction Oxygen Delivery Method: Circle system utilized Preoxygenation: Pre-oxygenation with 100% oxygen Induction Type: IV induction LMA: LMA with gastric port inserted LMA Size: 4.0 Number of attempts: 1 Placement Confirmation: positive ETCO2 Tube secured with: Tape Dental Injury: Teeth and Oropharynx as per pre-operative assessment

## 2018-01-22 NOTE — Op Note (Signed)
Preoperative diagnosis:  1. Bilateral nephrolithiasis  Postoperative diagnosis:  1. Same  Procedure: 1. Bilateral retrograde pyelogram with interpretation 2. Right ureteral stent removal 3. Left ureteroscopy, stent exchange, laser lithotripsy stone removal  Surgeon: Ardis Hughs, MD  Anesthesia: General  Complications: None  Intraoperative findings:  #1: The right retrograde pyelogram was performed using 10 cc of Omnipaque contrast which demonstrated some mild ureteral and renal pelvic dilation.  There are no appreciable filling defects.  The stent was completely removed and not replaced.  At the end of the surgery I went back to see how well the right-sided drain, and it appeared to be excessively dilated.  I then advanced a 5 [place] open-ended catheter into the right collecting system and drained the renal pelvis prior to determining the case.  I did not replace the stent. 2.:  The left retrograde pyelogram was performed in the routine fashion using 10 cc of Omnipaque contrast which did demonstrate a large filling defects within the lower pole of the kidney consistent with the patient's known stones.  There are no hydro-nephrosis. #3: The left ureteral stent was exchanged with a 6 [place] x24 cm double-J ureteral stent which was taped to the patient's leg.  EBL: Minimal  Specimens: The stones from the patient's left lower pole were removed and given to the patient at the time of discharge, they were not sent for stone evaluation.  Indication: [name] is a 62 y.o. patient with history of bilateral nephrolithiasis.  She presented to the emergency department approximately 6 weeks ago in extremis and was noted to have an obstructing right stone with infection.  She was found to be bacteremic.  Stent was placed on that side.  We then began the process of removing her stones bilaterally.  Today the patient presented for the second stage of the left side.  After reviewing the  management options for treatment, he elected to proceed with the above surgical procedure(s). We have discussed the potential benefits and risks of the procedure, side effects of the proposed treatment, the likelihood of the patient achieving the goals of the procedure, and any potential problems that might occur during the procedure or recuperation. Informed consent has been obtained.  Description of procedure:  The patient was taken to the operating room and general anesthesia was induced.  The patient was placed in the dorsal lithotomy position, prepped and draped in the usual sterile fashion, and preoperative antibiotics were administered. A preoperative time-out was performed.   A 30 degrees cystoscope was gently passed through the patient and into the bladder under visual guidance.  I grasped the stents that were emanating from the ureters which were entangled and pulled them to the urethral meatus simultaneously.  I then advanced a wire through the right stent and advanced the wire up to the right renal pelvis removing the stent.  I then advanced a 5 [place] open-ended catheter over the wire and into the distal right ureter.  10 cc of Omnipaque contrast was instilled and a retrograde pyelogram was performed with the above findings.  I then remove the open-ended catheter.  I then advanced a sensor wire through the left stent was emanating from the urethral meatus and into the left renal pelvis.  I then advanced a dual-lumen catheter over the wire and into the distal ureter.  I performed a retrograde pyelogram on the right side with the above findings.  I then advanced a second wire through the dual-lumen catheter advanced out into  the right renal pelvis removing the dual-lumen catheter.  I then advanced a 12/14 [place] ureteral access sheath into the proximal ureter and remove the inner portion of the sheath as well as the second wire.  I then advanced a flexible ureteroscope into the left renal pelvis  through the access sheath.  I then began removing the previously fragmented stones with an engage basket.  There was one stone that was too big to get through the sheath that needed to be lasered.  I did this with a 200 m fiber with settings of 1.0 J and 10 Hz.  A second third ultimately also had to be fragmented to get it through the access sheath.  All other stones were small enough to remove.  Once all the bigger pieces had been removed I then switched to the encompass basket and got all the remaining residual stone fragments.  At the end of this process the patient was completely stone free to the best of my visualization.  I slowly backed out the ureteroscope dragging the encompass basket to ensure that all stone fragments and ureteral debris had been removed and simultaneously backing the access sheath out noting no significant ureteral trauma.  I then passed the dual-lumen catheter up the left ureter and injected contrast to help facilitate stent placement.  Over the safety wire I advanced a 24 cm x 6 French double-J ureteral stent into the left renal pelvis under fluoroscopic guidance.  I advanced the stent to the urethral meatus noting a nice curl in the left upper pole prior to removing the wire entirely.  Once the wire was removed the stent curled into the patient's bladder.  Stent tether was then secured to the patient's left inner thigh.  I then returned to the right side and took a fluoroscopic spot image demonstrating a dilated right renal pelvis.  At this point I advanced the cystoscope into the patient's bladder and cannulated the right ureteral orifice advancing the 5 [place] open-ended catheter up into the renal pelvis.  The renal pelvis was then completely drained as noted by fluoroscopy prior to removing the open-ended catheter.  I also asked the anesthesiology team to administer Toradol for inflammation.  I then inserted a B&O suppository to the patient's rectum.  The case was then  terminated.  She was returned to the PACU in stable condition.  Disposition: The patient will be instructed to remove the stent 5 days from today.  She was given antibiotic to take prior to stent removal..  Ardis Hughs, M.D.

## 2018-01-22 NOTE — Transfer of Care (Signed)
Immediate Anesthesia Transfer of Care Note  Patient: [name]  Procedure(s) Performed: LEFT URETEROSCOPY STONE REMOVAL HOLMIUM LASER LEFT STENT EXCHANGE,BILATERAL RETROGRADES (Left ) RIGHT STENT REMOVAL (Right )  Patient Location: PACU  Anesthesia Type:General  Level of Consciousness: drowsy, patient cooperative and responds to stimulation  Airway & Oxygen Therapy: Patient Spontanous Breathing and Patient connected to face mask oxygen  Post-op Assessment: Report given to RN and Post -op Vital signs reviewed and stable  Post vital signs: Reviewed and stable  Last Vitals:  Vitals Value Taken Time  BP 131/60 01/22/2018 10:00 AM  Temp 36.9 C 01/22/2018 10:00 AM  Pulse 88 01/22/2018 10:02 AM  Resp 14 01/22/2018 10:02 AM  SpO2 94 % 01/22/2018 10:02 AM  Vitals shown include unvalidated device data.  Last Pain:  Vitals:   01/22/18 1000  TempSrc:   PainSc: (P) 0-No pain         Complications: No apparent anesthesia complications

## 2018-01-22 NOTE — Discharge Instructions (Signed)
DISCHARGE INSTRUCTIONS FOR KIDNEY STONE/URETERAL STENT   MEDICATIONS:  1.  Resume all your other meds from home - except do not take any extra narcotic pain meds that you may have at home.  2. Pyridium is to help with the burning/stinging when you urinate. 3. Vicodin is for moderate/severe pain, otherwise taking upto 1000 mg every 6 hours of plainTylenol will help treat your pain.   4. Take Bactrim one hour prior to removal of your stent.  5. Take Ketorolac 10mg  four times daily x 2 days 6. Resume Eliquis once Ketorolac has been completed.  ACTIVITY:  1. No strenuous activity x 1week  2. No driving while on narcotic pain medications  3. Drink plenty of water  4. Continue to walk at home - you can still get blood clots when you are at home, so keep active, but don't over do it.  5. May return to work/school tomorrow or when you feel ready   BATHING:  1. You can shower and we recommend daily showers  2. You have a string coming from your urethra: The stent string is attached to your ureteral stent. Do not pull on this.   SIGNS/SYMPTOMS TO CALL:  Please call us if you have a fever greater than 101.5, uncontrolled nausea/vomiting, uncontrolled pain, dizziness, unable to urinate, bloody urine, chest pain, shortness of breath, leg swelling, leg pain, redness around wound, drainage from wound, or any other concerns or questions.   You can reach [place] at [phone].   FOLLOW-UP:  1. You have an appointment in 6 weeks with a ultrasound of your kidneys prior. 2. You have a string attached to your stent, you may remove it on Wednesday, Sept 4th. To do this, pull the strings until the stents are completely removed. You may feel an odd sensation in your back.

## 2018-01-23 ENCOUNTER — Encounter (HOSPITAL_COMMUNITY): Payer: Self-pay | Admitting: Urology

## 2018-01-27 ENCOUNTER — Encounter: Payer: Self-pay | Admitting: Family

## 2018-01-27 ENCOUNTER — Ambulatory Visit: Payer: 59 | Admitting: Family

## 2018-01-27 VITALS — BP 130/76 | HR 78 | Temp 98.5°F | Resp 16 | Ht 64.0 in | Wt 316.8 lb

## 2018-01-27 DIAGNOSIS — Z23 Encounter for immunization: Secondary | ICD-10-CM | POA: Diagnosis not present

## 2018-01-27 DIAGNOSIS — B370 Candidal stomatitis: Secondary | ICD-10-CM

## 2018-01-27 DIAGNOSIS — E538 Deficiency of other specified B group vitamins: Secondary | ICD-10-CM

## 2018-01-27 DIAGNOSIS — I10 Essential (primary) hypertension: Secondary | ICD-10-CM

## 2018-01-27 DIAGNOSIS — J45909 Unspecified asthma, uncomplicated: Secondary | ICD-10-CM | POA: Diagnosis not present

## 2018-01-27 MED ORDER — NYSTATIN 100000 UNIT/ML MT SUSP: 5.0000 mL | Freq: Four times a day (QID) | OROMUCOSAL | 0 refills | Status: DC

## 2018-01-27 MED ORDER — VALACYCLOVIR HCL 1 G PO TABS: ORAL_TABLET | ORAL | 0 refills | Status: DC

## 2018-01-27 NOTE — Patient Instructions (Addendum)
Start nystatin for thrush. Rinse mouth out after you use symbicort. Start valtrex for cold sore.

## 2018-01-27 NOTE — Progress Notes (Signed)
Subjective:    Patient ID: [name], female    DOB: [birth year], 62 y.o.   MRN: [id number]  HPI   [name] is a 62 yr old female who presents today for follow up.  HTN-she is maintained on diltiazem 240 mg, furosemide 40 mg, BP Readings from Last 3 Encounters:  01/27/18 130/76  01/22/18 131/70  01/15/18 132/69   b12 deficiency- due for b12 shot today.   Asthma- continues symbicort, manageable.  Uses albuterol twice a week   She complains of burning in her mouth. Wt Readings from Last 3 Encounters:  01/27/18 (!) 316 lb 12.8 oz (143.7 kg)  01/22/18 (!) 312 lb 2 oz (141.6 kg)  01/15/18 (!) 315 lb (142.9 kg)    Review of Systems See HPI  Past Medical History:  Diagnosis Date  . Abnormal Pap smear    years ago/no biopsy  . Allergic rhinitis   . Anemia, iron deficiency   . Aortic atherosclerosis ([place])   . Arthritis    back- lower  . Asthma   . Atrial fibrillation ([place]) August 2012   OFF XARELTO LAST MONTH DUE TO BLEEDING IN STOOL  . Bursitis of hip left  . Chronic diastolic (congestive) heart failure ([place])   . Dysrhythmia    afib, followed by Dr. Stanford Breed   . Edema of both legs   . Fatty liver 01/07/2012  . Fatty liver   . Fibroid 1974   fibroid cyst on left fallopian tube  . Fibromyalgia   . Foot ulcer ([place])    AREA HEALED RIGHT FOOT  . GERD (gastroesophageal reflux disease)   . Headache(784.0)    occasional sinus headache   . Hepatomegaly   . History of blood transfusion JULY 2015  . History of E. coli septicemia   . History of MRSA infection   . Hypertension   . Hypoparathyroidism ([place]) 01/07/2011  . Hypothyroidism   . Kidney stone 10-10-15   passed on their own  . Morbid obesity ([place])   . Nephrolithiasis 2/16, 9/16   SEES DR Risa Grill  . Pernicious anemia 02/20/2014   followed by Debbrah Alar  . PONV (postoperative nausea and vomiting)   . Rash    thighs and back  . Seizures ([place])    infancy secondary to fever  . Thyroid cancer ([place])  2011   THYROIDECTOMY DONE  . Uterine cancer ([place]) 2014   Mirena IUD  . Vitamin B 12 deficiency   . Vitamin D deficiency      Social History   Socioeconomic History  . Marital status: Single    Spouse name: Not on file  . Number of children: 0  . Years of education: Not on file  . Highest education level: Not on file  Occupational History  . Occupation: works in Insurance claims handler  . Occupation: DESIGN COMPUTER CHIP    Employer: [employer]  Social Needs  . Financial resource strain: Not on file  . Food insecurity:    Worry: Not on file    Inability: Not on file  . Transportation needs:    Medical: Not on file    Non-medical: Not on file  Tobacco Use  . Smoking status: Former Smoker    Packs/day: 0.50    Years: 25.00    Pack years: 12.50    Types: Cigarettes    Start date: 12/24/1970    Last attempt to quit: 05/27/1995    Years since quitting: 22.6  . Smokeless tobacco: Never Used  .  Tobacco comment: quit smoking 19 years ago  Substance and Sexual Activity  . Alcohol use: Yes    Alcohol/week: 0.0 standard drinks    Comment: 1/2 glass per month  . Drug use: No  . Sexual activity: Yes    Partners: Male    Birth control/protection: Post-menopausal  Lifestyle  . Physical activity:    Days per week: Not on file    Minutes per session: Not on file  . Stress: Not on file  Relationships  . Social connections:    Talks on phone: Not on file    Gets together: Not on file    Attends religious service: Not on file    Active member of club or organization: Not on file    Attends meetings of clubs or organizations: Not on file    Relationship status: Not on file  . Intimate partner violence:    Fear of current or ex partner: Not on file    Emotionally abused: Not on file    Physically abused: Not on file    Forced sexual activity: Not on file  Other Topics Concern  . Not on file  Social History Narrative   Occupation: works in Insurance claims handler - Engineer, building services   Single       Former Smoker - quit tobacco 12 years ago.  She was light smoker for 10 years.                 Past Surgical History:  Procedure Laterality Date  . AMPUTATION Right 05/18/2014   Procedure: RIGHT FIFTH RAY AMPUTATION FOOT;  Surgeon: Wylene Simmer, MD;  Location: [facility];  Service: Orthopedics;  Laterality: Right;  . APPENDECTOMY    . BUNIONECTOMY     bilateral  . COLONOSCOPY WITH PROPOFOL N/A 02/02/2014   Procedure: COLONOSCOPY WITH PROPOFOL;  Surgeon: Irene Shipper, MD;  Location: [facility];  Service: Endoscopy;  Laterality: N/A;  . cyst on ovary removed     . CYSTOSCOPY W/ RETROGRADES  03/03/2012   Procedure: CYSTOSCOPY WITH RETROGRADE PYELOGRAM;  Surgeon: Bernestine Amass, MD;  Location: [facility];  Service: Urology;  Laterality: Bilateral;  . CYSTOSCOPY W/ URETERAL STENT PLACEMENT Right 11/25/2017   Procedure: CYSTOSCOPY WITH RETROGRADE RIGHT URETERAL STENT PLACEMENT;  Surgeon: Franchot Gallo, MD;  Location: [facility];  Service: Urology;  Laterality: Right;  . CYSTOSCOPY W/ URETERAL STENT PLACEMENT Right 01/15/2018   Procedure: RIGHT STENT EXCHANGE;  Surgeon: Ardis Hughs, MD;  Location: [facility];  Service: Urology;  Laterality: Right;  . CYSTOSCOPY WITH RETROGRADE PYELOGRAM, URETEROSCOPY AND STENT PLACEMENT Left 08/25/2012   Procedure: CYSTOSCOPY WITH RETROGRADE PYELOGRAM, URETEROSCOPY;  Surgeon: Bernestine Amass, MD;  [facility];  Service: Urology;  Laterality: Left;  . CYSTOSCOPY WITH STENT PLACEMENT Left 01/15/2018   Procedure: LEFT STENT PLACEMENT;  Surgeon: Ardis Hughs, MD;  [facility];  Service: Urology;  Laterality: Left;  . CYSTOSCOPY WITH STENT PLACEMENT Right 01/22/2018   Procedure: RIGHT STENT REMOVAL;  Surgeon: Ardis Hughs, MD;  [facility];  Service: Urology;  Laterality: Right;  . CYSTOSCOPY/URETEROSCOPY/HOLMIUM LASER/STENT PLACEMENT Right 12/23/2017   Procedure: RIGHT URETEROSCOPY STONE REMOVAL, HOLMIUM LASER, RIGHT /STENT  EXCHANGE;  Surgeon: Ardis Hughs, MD;  [facility];  Service: Urology;  Laterality: Right;  . CYSTOSCOPY/URETEROSCOPY/HOLMIUM LASER/STENT PLACEMENT Left 01/22/2018   Procedure: LEFT URETEROSCOPY STONE REMOVAL HOLMIUM LASER LEFT STENT Freddi Starr;  Surgeon: Ardis Hughs, MD;  [facility];  Service:  Urology;  Laterality: Left;  . DILATION AND CURETTAGE OF UTERUS N/A 02/21/2013   Procedure: DILATATION AND CURETTAGE;  Surgeon: Lyman Speller, MD;  Location: [facility];  Service: Gynecology;  Laterality: N/A;  . DILATION AND CURETTAGE OF UTERUS N/A 04/09/2015   Procedure: [place] IUD removal;  Surgeon: Megan Salon, MD;  Location: [facility];  Service: Gynecology;  Laterality: N/A;  Patient weight 307lbs  . ESOPHAGOGASTRODUODENOSCOPY (EGD) WITH PROPOFOL N/A 02/02/2014   Procedure: ESOPHAGOGASTRODUODENOSCOPY (EGD) WITH PROPOFOL;  Surgeon: Irene Shipper, MD;  [facility];  Service: Endoscopy;  Laterality: N/A;  . GASTRIC BYPASS  1974  . HOLMIUM LASER APPLICATION Left 12/28/1322   Procedure: HOLMIUM LASER APPLICATION;  Surgeon: Bernestine Amass, MD;  [facility];  Service: Urology;  Laterality: Left;  . LITHOTRIPSY  03/2012  . LITHOTRIPSY  2/16  . THYROIDECTOMY  05/15/2010  . TONSILLECTOMY AND ADENOIDECTOMY    . URETEROSCOPY  03/03/2012   Procedure: URETEROSCOPY;  Surgeon: Bernestine Amass, MD;  [facility];  Service: Urology;  Laterality: Left;  . URETEROSCOPY WITH HOLMIUM LASER LITHOTRIPSY Bilateral 01/15/2018   Procedure: CYSTOSCOPY/BILATERAL URETEROSCOPY WITH HOLMIUM LASER LITHOTRIPSY STONE REMOVAL;  Surgeon: Ardis Hughs, MD;  [facility];  Service: Urology;  Laterality: Bilateral;    Family History  Problem Relation Age of Onset  . Hypertension Father   . Diabetes Father   . Lung cancer Father   . Heart attack Father        MI at age 20  . Hypertension Mother   . Hyperthyroidism Mother   . Heart disease  Mother   . Heart attack Mother        MI at age 36  . Asthma Brother   . Hypertension Brother        younger  . Heart disease Brother        older  . Colon cancer Neg Hx   . Esophageal cancer Neg Hx   . Stomach cancer Neg Hx   . Kidney disease Neg Hx   . Liver disease Neg Hx     Allergies  Allergen Reactions  . Other Anaphylaxis    NUTS  . Ciprofloxacin     Rash nausea  . Penicillins Other (See Comments)    Headache. And hives  Has patient had a PCN reaction causing immediate rash, facial/tongue/throat swelling, SOB or lightheadedness with hypotension: No Has patient had a PCN reaction causing severe rash involving mucus membranes or skin necrosis: No Has patient had a PCN reaction that required hospitalization: No Has patient had a PCN reaction occurring within the last 10 years: Yes If all of the above answers are "NO", then may proceed with Cephalosporin use.   [name] Amoxicillin-Pot Clavulanate Other (See Comments)    headache  . Food Hives    Potato  . Tomato Hives    Current Outpatient Medications on File Prior to Visit  Medication Sig Dispense Refill  . acetaminophen (TYLENOL) 325 MG tablet Take 2 tablets (650 mg total) by mouth every 6 (six) hours as needed for mild pain or moderate pain. 60 tablet 0  . albuterol (PROVENTIL HFA;VENTOLIN HFA) 108 (90 Base) MCG/ACT inhaler Inhale 2 puffs into the lungs every 6 (six) hours as needed for wheezing or shortness of breath. 1 Inhaler 5  . calcitRIOL (ROCALTROL) 0.5 MCG capsule Take 2 mcg by mouth daily.     . calcium carbonate (TUMS - DOSED IN MG ELEMENTAL CALCIUM) 500 MG  chewable tablet Chew 2 tablets (400 mg of elemental calcium total) by mouth 4 (four) times daily. (Patient taking differently: Chew 2 tablets by mouth 3 (three) times daily. )    . citric acid-potassium citrate (POLYCITRA) 1100-334 MG/5ML solution Take 1 mL by mouth 2 (two) times daily.     . Cyanocobalamin (VITAMIN B-12 IJ) Inject 1,000 mcg as directed every  30 (thirty) days.     [name] desoximetasone (TOPICORT) 0.05 % cream Apply topically 2 (two) times daily as needed. 60 g 0  . diltiazem (CARDIZEM CD) 240 MG 24 hr capsule TAKE 1 CAPSULE BY MOUTH  DAILY 90 capsule 0  . diphenhydrAMINE (BENADRYL) 25 mg capsule Take 25 mg by mouth every 6 (six) hours as needed for itching.     . fluticasone (FLONASE) 50 MCG/ACT nasal spray Place 1 spray into both nostrils daily.     . furosemide (LASIX) 40 MG tablet 2 tabs by mouth daily in the AM and 1 tablet in the PM (Patient taking differently: Take 80 mg by mouth 2 (two) times daily. ) 270 tablet 1  . HYDROcodone-acetaminophen (NORCO/VICODIN) 5-325 MG tablet Take 1-2 tablets by mouth every 6 (six) hours as needed. 10 tablet 0  . levocetirizine (XYZAL) 5 MG tablet TAKE 1 TABLET BY MOUTH  EVERY EVENING 90 tablet 3  . levothyroxine (SYNTHROID, LEVOTHROID) 100 MCG tablet Take 350 mcg by mouth daily.     [name] loratadine (CLARITIN) 10 MG tablet Take 10 mg by mouth every morning.    . Magnesium 125 MG CAPS Take 125 mg by mouth 2 (two) times daily.     . Melatonin 5 MG TABS Take 5 mg by mouth at bedtime.    . mometasone (ELOCON) 0.1 % ointment Apply 1 application topically 2 (two) times daily.    [name] nystatin (MYCOSTATIN/NYSTOP) powder Apply topically 3 (three) times daily. 15 g 0  . omeprazole (PRILOSEC) 40 MG capsule TAKE 1 CAPSULE BY MOUTH  DAILY 90 capsule 1  . ondansetron (ZOFRAN) 4 MG tablet Take 1 tablet (4 mg total) by mouth every 8 (eight) hours as needed for nausea or vomiting. 30 tablet 0  . phenazopyridine (PYRIDIUM) 200 MG tablet Take 1 tablet (200 mg total) by mouth 3 (three) times daily as needed for pain. 20 tablet 0  . predniSONE (DELTASONE) 10 MG tablet 4 tabs by mouth once daily for 2 days, then 3 tabs daily x 2 days, then 2 tabs daily x 2 days, then 1 tab daily x 2 days 20 tablet 0  . saccharomyces boulardii (FLORASTOR) 250 MG capsule Take 1 capsule (250 mg total) by mouth 2 (two) times daily. (Patient taking  differently: Take 250 mg by mouth daily as needed (regularity). ) 30 capsule 0  . SYMBICORT 160-4.5 MCG/ACT inhaler INHALE 2 PUFFS TWO TIMES  DAILY (Patient taking differently: Inhale 2 puffs into the lungs 2 (two) times daily. ) 30.6 g 1  . Vitamin D, Ergocalciferol, (DRISDOL) 50000 units CAPS capsule TAKE 1 CAPSULE BY MOUTH 3  TIMES WEEKLY (Patient taking differently: Take 50,000 Units by mouth every Monday, Wednesday, and Friday. ) 39 capsule 1  . zafirlukast (ACCOLATE) 20 MG tablet TAKE 1 TABLET BY MOUTH TWO  TIMES DAILY BEFORE MEALS (Patient taking differently: Take 20 mg by mouth 2 (two) times daily before a meal. ) 180 tablet 1   No current facility-administered medications on file prior to visit.     BP 130/76 (BP Location: Left Arm, Cuff Size: Normal)  Pulse 78   Temp 98.5 F (36.9 C) (Oral)   Resp 16   Ht 5\' 4"  (1.626 m)   Wt (!) 316 lb 12.8 oz (143.7 kg)   LMP 06/11/2012   SpO2 97%   BMI 54.38 kg/m       Objective:   Physical Exam  Constitutional: She is oriented to person, place, and time. She appears well-developed and well-nourished.  HENT:  Oral thrush noted  Cardiovascular: Normal rate, regular rhythm and normal heart sounds.  No murmur heard. Pulmonary/Chest: Effort normal and breath sounds normal. No respiratory distress. She has no wheezes.  Neurological: She is alert and oriented to person, place, and time.  Skin: Skin is warm and dry.  Mild venous stasis changes bilateral LE   Psychiatric: She has a normal mood and affect. Her behavior is normal. Judgment and thought content normal.          Assessment & Plan:  HTN-  BP is stable, continue current meds.   Oral thrush- rx with nystatin, advised patient to rinse her mouth after using Symbicort to avoid thrush.  B12 deficiency- continue B12 injections.

## 2018-01-28 ENCOUNTER — Telehealth: Payer: Self-pay | Admitting: *Deleted

## 2018-01-28 ENCOUNTER — Encounter: Payer: Self-pay | Admitting: Family

## 2018-01-28 DIAGNOSIS — L309 Dermatitis, unspecified: Secondary | ICD-10-CM | POA: Diagnosis not present

## 2018-01-28 NOTE — Telephone Encounter (Signed)
Received FMLA/STD paperwork from [facility] Team requesting additional information on STD claim from follow-up letter dated 01/18/18, completed as much as possible; forwarded to provider/SLS 09/05

## 2018-01-29 MED ORDER — ALBUTEROL SULFATE (2.5 MG/3ML) 0.083% IN NEBU: 2.5000 mg | INHALATION_SOLUTION | Freq: Four times a day (QID) | RESPIRATORY_TRACT | 1 refills | Status: AC | PRN

## 2018-02-02 NOTE — Telephone Encounter (Signed)
Below information was faxed to [phone] on 02/01/18 at 5:48pm.

## 2018-02-03 ENCOUNTER — Ambulatory Visit: Payer: 59 | Admitting: Hematology & Oncology

## 2018-02-03 ENCOUNTER — Other Ambulatory Visit: Payer: 59

## 2018-02-04 DIAGNOSIS — J4540 Moderate persistent asthma, uncomplicated: Secondary | ICD-10-CM | POA: Diagnosis not present

## 2018-02-04 DIAGNOSIS — J3089 Other allergic rhinitis: Secondary | ICD-10-CM | POA: Diagnosis not present

## 2018-02-04 DIAGNOSIS — J301 Allergic rhinitis due to pollen: Secondary | ICD-10-CM | POA: Diagnosis not present

## 2018-02-04 DIAGNOSIS — J3081 Allergic rhinitis due to animal (cat) (dog) hair and dander: Secondary | ICD-10-CM | POA: Diagnosis not present

## 2018-02-06 ENCOUNTER — Other Ambulatory Visit: Payer: Self-pay | Admitting: Family

## 2018-02-08 ENCOUNTER — Telehealth: Payer: Self-pay | Admitting: *Deleted

## 2018-02-08 DIAGNOSIS — M2031 Hallux varus (acquired), right foot: Secondary | ICD-10-CM

## 2018-02-08 NOTE — Telephone Encounter (Signed)
Also prior to surgery she is going to need medical clearance. She has had several medical issues since I last saw her including sepsis. This surgery is going to require putting hardware in the foot which can increase her risk of infection. She is also going to be NWB postop and need to make sure she can do this. It may be a good idea for her to see PT PRIOR to surgery for gait training and to ensure she can be NWB

## 2018-02-08 NOTE — Addendum Note (Signed)
Addended by: Lolita Rieger on: 02/08/2018 12:06 PM   Modules accepted: Orders

## 2018-02-08 NOTE — Telephone Encounter (Signed)
"  I'm calling to schedule my surgery."  Have you signed consent forms?  "Yes, I have."  Do you have a date in mind that you would like?  "No, whatever he has available."  He can do it Wednesday of next week, September 25.  "That date will be fine."  Did you have your vascular study done that Dr. Jacqualyn Posey requested?  "No, I did not have it done."  You need to have that done prior to having surgery.  The order has been placed.  You can call [facility] to schedule the appointment.  "What's their phone number?"  Their phone number is [phone].  "Okay, thank you."

## 2018-02-09 ENCOUNTER — Telehealth: Payer: Self-pay | Admitting: Family

## 2018-02-09 ENCOUNTER — Encounter: Payer: Self-pay | Admitting: *Deleted

## 2018-02-09 ENCOUNTER — Telehealth: Payer: Self-pay | Admitting: *Deleted

## 2018-02-09 DIAGNOSIS — M2031 Hallux varus (acquired), right foot: Secondary | ICD-10-CM

## 2018-02-09 NOTE — Telephone Encounter (Signed)
Thanks. Also can you put in for PT at [facility] for PT PRIOR to surgery to help her in her postop state? Thanks.

## 2018-02-09 NOTE — Telephone Encounter (Signed)
I put in the order for physical therapy.  I called and informed the patient that she should get a call from [facility] for a physical therapy session(s).

## 2018-02-09 NOTE — Telephone Encounter (Signed)
Please contact pt to arrange a pre-op clearance visit for her surgery with me.

## 2018-02-09 NOTE — Telephone Encounter (Signed)
I will change patients postop appointments and update in one medical passport.

## 2018-02-09 NOTE — Telephone Encounter (Signed)
"  I have surgery scheduled for next week.  I need to change that."  I was actually going to call you today.  Dr. Jacqualyn Posey wants medical clearance from your primary care physician.  What is your doctor's name.  "Okay, her name is Lemar Livings at [facility]."  What date do you have in your mind?  "I want to do it the following week."  I will get you rescheduled to 02/24/2018.

## 2018-02-10 NOTE — Telephone Encounter (Signed)
Called pt and LVM informing pt that she would need to schedule a pre-op clearance appt. Advised pt to call and schedule an appt at her earliest convenience.

## 2018-02-15 ENCOUNTER — Encounter (HOSPITAL_COMMUNITY): Payer: Self-pay | Admitting: Podiatry

## 2018-02-15 ENCOUNTER — Ambulatory Visit (HOSPITAL_COMMUNITY): Admission: RE | Admit: 2018-02-15 | Payer: 59 | Source: Ambulatory Visit | Attending: Podiatry | Admitting: Podiatry

## 2018-02-16 ENCOUNTER — Ambulatory Visit (HOSPITAL_BASED_OUTPATIENT_CLINIC_OR_DEPARTMENT_OTHER)
Admission: RE | Admit: 2018-02-16 | Discharge: 2018-02-16 | Disposition: A | Discharge status: Home / Self Care | Payer: 59 | Source: Ambulatory Visit | Attending: Family | Admitting: Family

## 2018-02-16 ENCOUNTER — Ambulatory Visit: Payer: 59

## 2018-02-16 ENCOUNTER — Ambulatory Visit (INDEPENDENT_AMBULATORY_CARE_PROVIDER_SITE_OTHER): Payer: 59

## 2018-02-16 ENCOUNTER — Encounter: Payer: Self-pay | Admitting: Family

## 2018-02-16 ENCOUNTER — Ambulatory Visit (INDEPENDENT_AMBULATORY_CARE_PROVIDER_SITE_OTHER): Payer: 59 | Admitting: Family

## 2018-02-16 VITALS — BP 126/78 | HR 78 | Temp 98.0°F | Resp 18 | Ht 64.0 in | Wt 317.6 lb

## 2018-02-16 DIAGNOSIS — B370 Candidal stomatitis: Secondary | ICD-10-CM

## 2018-02-16 DIAGNOSIS — Z01818 Encounter for other preprocedural examination: Secondary | ICD-10-CM

## 2018-02-16 DIAGNOSIS — E538 Deficiency of other specified B group vitamins: Secondary | ICD-10-CM

## 2018-02-16 LAB — HEPATIC FUNCTION PANEL
ALT: 38 U/L — ABNORMAL HIGH (ref 0–35)
AST: 25 U/L (ref 0–37)
Albumin: 3.8 g/dL (ref 3.5–5.2)
Alkaline Phosphatase: 110 U/L (ref 39–117)
BILIRUBIN DIRECT: 0.1 mg/dL (ref 0.0–0.3)
BILIRUBIN TOTAL: 0.6 mg/dL (ref 0.2–1.2)
Total Protein: 6.9 g/dL (ref 6.0–8.3)

## 2018-02-16 LAB — URINALYSIS, ROUTINE W REFLEX MICROSCOPIC
Bilirubin Urine: NEGATIVE
KETONES UR: NEGATIVE
Nitrite: NEGATIVE
Specific Gravity, Urine: 1.015 (ref 1.000–1.030)
Total Protein, Urine: NEGATIVE
URINE GLUCOSE: NEGATIVE
UROBILINOGEN UA: 0.2 (ref 0.0–1.0)
pH: 6 (ref 5.0–8.0)

## 2018-02-16 LAB — BASIC METABOLIC PANEL
BUN: 17 mg/dL (ref 6–23)
CHLORIDE: 95 meq/L — AB (ref 96–112)
CO2: 32 meq/L (ref 19–32)
CREATININE: 0.84 mg/dL (ref 0.40–1.20)
Calcium: 7.6 mg/dL — ABNORMAL LOW (ref 8.4–10.5)
GFR: 73.32 mL/min (ref >60.00)
Glucose, Bld: 92 mg/dL (ref 70–99)
Potassium: 3.5 mEq/L (ref 3.5–5.1)
Sodium: 137 mEq/L (ref 135–145)

## 2018-02-16 LAB — APTT: aPTT: 31.6 s (ref 23.4–32.7)

## 2018-02-16 LAB — CBC WITH DIFFERENTIAL/PLATELET
BASOS PCT: 0.9 % (ref 0.0–3.0)
Basophils Absolute: 0.1 10*3/uL (ref 0.0–0.1)
EOS ABS: 0.2 10*3/uL (ref 0.0–0.7)
Eosinophils Relative: 1.5 % (ref 0.0–5.0)
HCT: 39.8 % (ref 36.0–46.0)
Hemoglobin: 12.9 g/dL (ref 12.0–15.0)
Lymphocytes Relative: 10.9 % — ABNORMAL LOW (ref 12.0–46.0)
Lymphs Abs: 1.2 10*3/uL (ref 0.7–4.0)
MCHC: 32.5 g/dL (ref 30.0–36.0)
MCV: 86 fl (ref 78.0–100.0)
MONO ABS: 0.5 10*3/uL (ref 0.1–1.0)
Monocytes Relative: 4.8 % (ref 3.0–12.0)
NEUTROS ABS: 9.1 10*3/uL — AB (ref 1.4–7.7)
Neutrophils Relative %: 81.9 % — ABNORMAL HIGH (ref 43.0–77.0)
PLATELETS: 192 10*3/uL (ref 150.0–400.0)
RBC: 4.63 Mil/uL (ref 3.87–5.11)
RDW: 16 % — ABNORMAL HIGH (ref 11.5–15.5)
WBC: 11.1 10*3/uL — AB (ref 4.0–10.5)

## 2018-02-16 LAB — PROTIME-INR
INR: 1.1 ratio — ABNORMAL HIGH (ref 0.8–1.0)
Prothrombin Time: 13.4 s — ABNORMAL HIGH (ref 9.6–13.1)

## 2018-02-16 MED ORDER — CYANOCOBALAMIN 1000 MCG/ML IJ SOLN
1000.0000 ug | Freq: Once | INTRAMUSCULAR | Status: AC
  Administered 2018-02-16: 1000 ug via INTRAMUSCULAR

## 2018-02-16 NOTE — Progress Notes (Signed)
Pre visit review using our clinic tool,if applicable. No additional management support is needed unless otherwise documented below in the visit note.  Pt here for monthly B12 injection per order from M. Inda Castle , NP  B12 1021mcg given IMLeft deltoid, and pt tolerated injection well. No complaints voiced.  Next B12 injection scheduled for Ocrober 23,2019.

## 2018-02-16 NOTE — Patient Instructions (Addendum)
Please complete lab work prior to leaving.  Complete chest x-ray on the first floor.  

## 2018-02-16 NOTE — Progress Notes (Addendum)
Subjective:    Patient ID: [name], female    DOB: 1955-07-11, 62 y.o.   MRN: [id number]  HPI  Patient is a 62 yr old female who presents today for pre-operative evaluation.  She is scheduled for toe surgery on 02/24/18 (Hallux MPJ fusion right great toe with fixation). This will be done by Dr. Jacqualyn Posey.   She will have hardware placed on the right great toe.  She is still out of work. She will be out of work x 6 additional weeks.    She sees urology on 02/22/18 for follow up.   b12 deficiency- b12 shot today.   Wt Readings from Last 3 Encounters:  02/16/18 (!) 317 lb 9.6 oz (144.1 kg)  01/27/18 (!) 316 lb 12.8 oz (143.7 kg)  01/22/18 (!) 312 lb 2 oz (141.6 kg)     Review of Systems  Constitutional: Negative for unexpected weight change.  HENT: Negative for hearing loss and rhinorrhea.   Eyes: Negative for visual disturbance.  Respiratory: Negative for cough.   Cardiovascular: Positive for leg swelling.       Has not yet taken her AM lasix  Gastrointestinal: Negative for constipation, diarrhea and nausea.  Genitourinary: Negative for dysuria, frequency and hematuria.  Musculoskeletal: Positive for arthralgias.  Skin: Positive for rash.       Bilateral forearms, was given topical cream by dermatology  Neurological: Negative for headaches.  Hematological: Negative for adenopathy.  Psychiatric/Behavioral:       Denies depression/anxiety   Past Medical History:  Diagnosis Date  . Abnormal Pap smear    years ago/no biopsy  . Allergic rhinitis   . Anemia, iron deficiency   . Aortic atherosclerosis ([place])   . Arthritis    back- lower  . Asthma   . Atrial fibrillation ([place]) August 2012   OFF XARELTO LAST MONTH DUE TO BLEEDING IN STOOL  . Bursitis of hip left  . Chronic diastolic (congestive) heart failure ([place])   . Dysrhythmia    afib, followed by Dr. Stanford Breed   . Edema of both legs   . Fatty liver 01/07/2012  . Fatty liver   . Fibroid 1974   fibroid cyst on left  fallopian tube  . Fibromyalgia   . Foot ulcer ([place])    AREA HEALED RIGHT FOOT  . GERD (gastroesophageal reflux disease)   . Headache(784.0)    occasional sinus headache   . Hepatomegaly   . History of blood transfusion JULY 2015  . History of E. coli septicemia   . History of MRSA infection   . Hypertension   . Hypoparathyroidism ([place]) 01/07/2011  . Hypothyroidism   . Kidney stone Sep 25, 2015   passed on their own  . Morbid obesity ([place])   . Nephrolithiasis 2/16, 9/16   SEES DR Risa Grill  . Pernicious anemia 02/20/2014   followed by Debbrah Alar  . PONV (postoperative nausea and vomiting)   . Rash    thighs and back  . Seizures ([place])    infancy secondary to fever  . Thyroid cancer ([place]) 2011   THYROIDECTOMY DONE  . Uterine cancer ([place]) 2014   Mirena IUD  . Vitamin B 12 deficiency   . Vitamin D deficiency      Social History   Socioeconomic History  . Marital status: Single    Spouse name: Not on file  . Number of children: 0  . Years of education: Not on file  . Highest education level: Not on file  Occupational  History  . Occupation: works in Insurance claims handler  . Occupation: DESIGN COMPUTER CHIP    Employer: [employer]  Social Needs  . Financial resource strain: Not on file  . Food insecurity:    Worry: Not on file    Inability: Not on file  . Transportation needs:    Medical: Not on file    Non-medical: Not on file  Tobacco Use  . Smoking status: Former Smoker    Packs/day: 0.50    Years: 25.00    Pack years: 12.50    Types: Cigarettes    Start date: 12/24/1970    Last attempt to quit: 05/27/1995    Years since quitting: 22.7  . Smokeless tobacco: Never Used  . Tobacco comment: quit smoking 19 years ago  Substance and Sexual Activity  . Alcohol use: Yes    Alcohol/week: 0.0 standard drinks    Comment: 1/2 glass per month  . Drug use: No  . Sexual activity: Yes    Partners: Male    Birth control/protection: Post-menopausal  Lifestyle  . Physical  activity:    Days per week: Not on file    Minutes per session: Not on file  . Stress: Not on file  Relationships  . Social connections:    Talks on phone: Not on file    Gets together: Not on file    Attends religious service: Not on file    Active member of club or organization: Not on file    Attends meetings of clubs or organizations: Not on file    Relationship status: Not on file  . Intimate partner violence:    Fear of current or ex partner: Not on file    Emotionally abused: Not on file    Physically abused: Not on file    Forced sexual activity: Not on file  Other Topics Concern  . Not on file  Social History Narrative   Occupation: works in Insurance claims handler - Field seismologist   Single       Former Smoker - quit tobacco 12 years ago.  She was light smoker for 10 years.                 Past Surgical History:  Procedure Laterality Date  . AMPUTATION Right 05/18/2014   Procedure: RIGHT FIFTH RAY AMPUTATION FOOT;  Surgeon: Wylene Simmer, MD;  Location: [facility];  Service: Orthopedics;  Laterality: Right;  . APPENDECTOMY    . BUNIONECTOMY     bilateral  . COLONOSCOPY WITH PROPOFOL N/A 02/02/2014   Procedure: COLONOSCOPY WITH PROPOFOL;  Surgeon: Irene Shipper, MD;  Location: [facility];  Service: Endoscopy;  Laterality: N/A;  . cyst on ovary removed     . CYSTOSCOPY W/ RETROGRADES  03/03/2012   Procedure: CYSTOSCOPY WITH RETROGRADE PYELOGRAM;  Surgeon: Bernestine Amass, MD;  Location: [facility];  Service: Urology;  Laterality: Bilateral;  . CYSTOSCOPY W/ URETERAL STENT PLACEMENT Right 11/25/2017   Procedure: CYSTOSCOPY WITH RETROGRADE RIGHT URETERAL STENT PLACEMENT;  Surgeon: Franchot Gallo, MD;  Location: [facility];  Service: Urology;  Laterality: Right;  . CYSTOSCOPY W/ URETERAL STENT PLACEMENT Right 01/15/2018   Procedure: RIGHT STENT EXCHANGE;  Surgeon: Ardis Hughs, MD;  [facility];  Service: Urology;  Laterality: Right;  . CYSTOSCOPY WITH RETROGRADE  PYELOGRAM, URETEROSCOPY AND STENT PLACEMENT Left 08/25/2012   Procedure: CYSTOSCOPY WITH RETROGRADE PYELOGRAM, URETEROSCOPY;  Surgeon: Bernestine Amass, MD;  [facility];  Service: Urology;  Laterality: Left;  .  CYSTOSCOPY WITH STENT PLACEMENT Left 01/15/2018   Procedure: LEFT STENT PLACEMENT;  Surgeon: Ardis Hughs, MD;  [facility];  Service: Urology;  Laterality: Left;  . CYSTOSCOPY WITH STENT PLACEMENT Right 01/22/2018   Procedure: RIGHT STENT REMOVAL;  Surgeon: Ardis Hughs, MD;  [facility];  Service: Urology;  Laterality: Right;  . CYSTOSCOPY/URETEROSCOPY/HOLMIUM LASER/STENT PLACEMENT Right 12/23/2017   Procedure: RIGHT URETEROSCOPY STONE REMOVAL, HOLMIUM LASER, RIGHT /STENT EXCHANGE;  Surgeon: Ardis Hughs, MD;  [facility];  Service: Urology;  Laterality: Right;  . CYSTOSCOPY/URETEROSCOPY/HOLMIUM LASER/STENT PLACEMENT Left 01/22/2018   Procedure: LEFT URETEROSCOPY STONE REMOVAL HOLMIUM LASER LEFT STENT Freddi Starr;  Surgeon: Ardis Hughs, MD;  [facility];  Service: Urology;  Laterality: Left;  . DILATION AND CURETTAGE OF UTERUS N/A 02/21/2013   Procedure: DILATATION AND CURETTAGE;  Surgeon: Lyman Speller, MD;  Location: [facility];  Service: Gynecology;  Laterality: N/A;  . DILATION AND CURETTAGE OF UTERUS N/A 04/09/2015   Procedure: [place] IUD removal;  Surgeon: Megan Salon, MD;  Location: [facility];  Service: Gynecology;  Laterality: N/A;  Patient weight 307lbs  . ESOPHAGOGASTRODUODENOSCOPY (EGD) WITH PROPOFOL N/A 02/02/2014   Procedure: ESOPHAGOGASTRODUODENOSCOPY (EGD) WITH PROPOFOL;  Surgeon: Irene Shipper, MD;  [facility];  Service: Endoscopy;  Laterality: N/A;  . GASTRIC BYPASS  1974  . HOLMIUM LASER APPLICATION Left 11/28/6431   Procedure: HOLMIUM LASER APPLICATION;  Surgeon: Bernestine Amass, MD;  [facility];  Service: Urology;  Laterality: Left;  . LITHOTRIPSY  03/2012  .  LITHOTRIPSY  2/16  . THYROIDECTOMY  05/15/2010  . TONSILLECTOMY AND ADENOIDECTOMY    . URETEROSCOPY  03/03/2012   Procedure: URETEROSCOPY;  Surgeon: Bernestine Amass, MD;  [facility];  Service: Urology;  Laterality: Left;  . URETEROSCOPY WITH HOLMIUM LASER LITHOTRIPSY Bilateral 01/15/2018   Procedure: CYSTOSCOPY/BILATERAL URETEROSCOPY WITH HOLMIUM LASER LITHOTRIPSY STONE REMOVAL;  Surgeon: Ardis Hughs, MD;  [facility];  Service: Urology;  Laterality: Bilateral;    Family History  Problem Relation Age of Onset  . Hypertension Father   . Diabetes Father   . Lung cancer Father   . Heart attack Father        MI at age 70  . Hypertension Mother   . Hyperthyroidism Mother   . Heart disease Mother   . Heart attack Mother        MI at age 66  . Asthma Brother   . Hypertension Brother        younger  . Heart disease Brother        older  . Colon cancer Neg Hx   . Esophageal cancer Neg Hx   . Stomach cancer Neg Hx   . Kidney disease Neg Hx   . Liver disease Neg Hx     Allergies  Allergen Reactions  . Other Anaphylaxis    NUTS  . Ciprofloxacin     Rash nausea  . Penicillins Other (See Comments)    Headache. And hives  Has patient had a PCN reaction causing immediate rash, facial/tongue/throat swelling, SOB or lightheadedness with hypotension: No Has patient had a PCN reaction causing severe rash involving mucus membranes or skin necrosis: No Has patient had a PCN reaction that required hospitalization: No Has patient had a PCN reaction occurring within the last 10 years: Yes If all of the above answers are "NO", then may proceed with Cephalosporin use.   [name] Amoxicillin-Pot Clavulanate Other (  See Comments)    headache  . Food Hives    Potato  . Tomato Hives    Current Outpatient Medications on File Prior to Visit  Medication Sig Dispense Refill  . acetaminophen (TYLENOL) 325 MG tablet Take 2 tablets (650 mg total) by mouth every 6 (six) hours as needed for  mild pain or moderate pain. 60 tablet 0  . albuterol (PROVENTIL HFA;VENTOLIN HFA) 108 (90 Base) MCG/ACT inhaler Inhale 2 puffs into the lungs every 6 (six) hours as needed for wheezing or shortness of breath. 1 Inhaler 5  . albuterol (PROVENTIL) (2.5 MG/3ML) 0.083% nebulizer solution Take 3 mLs (2.5 mg total) by nebulization every 6 (six) hours as needed for wheezing or shortness of breath. 150 mL 1  . calcitRIOL (ROCALTROL) 0.5 MCG capsule Take 2 mcg by mouth daily.     . calcium carbonate (TUMS - DOSED IN MG ELEMENTAL CALCIUM) 500 MG chewable tablet Chew 2 tablets (400 mg of elemental calcium total) by mouth 4 (four) times daily. (Patient taking differently: Chew 2 tablets by mouth 3 (three) times daily. )    . citric acid-potassium citrate (POLYCITRA) 1100-334 MG/5ML solution Take 1 mL by mouth 2 (two) times daily.     . Cyanocobalamin (VITAMIN B-12 IJ) Inject 1,000 mcg as directed every 30 (thirty) days.     [name] desoximetasone (TOPICORT) 0.05 % cream Apply topically 2 (two) times daily as needed. 60 g 0  . diltiazem (CARDIZEM CD) 240 MG 24 hr capsule TAKE 1 CAPSULE BY MOUTH  DAILY 90 capsule 0  . diphenhydrAMINE (BENADRYL) 25 mg capsule Take 25 mg by mouth every 6 (six) hours as needed for itching.     . fluticasone (FLONASE) 50 MCG/ACT nasal spray Place 1 spray into both nostrils daily.     . furosemide (LASIX) 40 MG tablet TAKE 2 TABLETS BY MOUTH  DAILY IN THE MORNING AND 1  TABLET IN THE EVENING 270 tablet 1  . HYDROcodone-acetaminophen (NORCO/VICODIN) 5-325 MG tablet Take 1-2 tablets by mouth every 6 (six) hours as needed. 10 tablet 0  . levocetirizine (XYZAL) 5 MG tablet TAKE 1 TABLET BY MOUTH  EVERY EVENING 90 tablet 3  . levothyroxine (SYNTHROID, LEVOTHROID) 100 MCG tablet Take 350 mcg by mouth daily.     [name] loratadine (CLARITIN) 10 MG tablet Take 10 mg by mouth every morning.    . Magnesium 125 MG CAPS Take 125 mg by mouth 2 (two) times daily.     . Melatonin 5 MG TABS Take 5 mg by mouth at  bedtime.    . mometasone (ELOCON) 0.1 % ointment Apply 1 application topically 2 (two) times daily.    [name] nystatin (MYCOSTATIN) 100000 UNIT/ML suspension Take 5 mLs (500,000 Units total) by mouth 4 (four) times daily. 200 mL 0  . nystatin (MYCOSTATIN/NYSTOP) powder Apply topically 3 (three) times daily. 15 g 0  . omeprazole (PRILOSEC) 40 MG capsule TAKE 1 CAPSULE BY MOUTH  DAILY 90 capsule 1  . ondansetron (ZOFRAN) 4 MG tablet Take 1 tablet (4 mg total) by mouth every 8 (eight) hours as needed for nausea or vomiting. 30 tablet 0  . phenazopyridine (PYRIDIUM) 200 MG tablet Take 1 tablet (200 mg total) by mouth 3 (three) times daily as needed for pain. 20 tablet 0  . saccharomyces boulardii (FLORASTOR) 250 MG capsule Take 1 capsule (250 mg total) by mouth 2 (two) times daily. (Patient taking differently: Take 250 mg by mouth daily as needed (regularity). ) 30 capsule  0  . SYMBICORT 160-4.5 MCG/ACT inhaler INHALE 2 PUFFS TWO TIMES  DAILY (Patient taking differently: Inhale 2 puffs into the lungs 2 (two) times daily. ) 30.6 g 1  . valACYclovir (VALTREX) 1000 MG tablet 2 tabs by mouth now and again in 12 hours 4 tablet 0  . Vitamin D, Ergocalciferol, (DRISDOL) 50000 units CAPS capsule TAKE 1 CAPSULE BY MOUTH 3  TIMES WEEKLY (Patient taking differently: Take 50,000 Units by mouth every Monday, Wednesday, and Friday. ) 39 capsule 1  . zafirlukast (ACCOLATE) 20 MG tablet TAKE 1 TABLET BY MOUTH TWO  TIMES DAILY BEFORE MEALS (Patient taking differently: Take 20 mg by mouth 2 (two) times daily before a meal. ) 180 tablet 1   No current facility-administered medications on file prior to visit.     BP 126/78 (BP Location: Right Arm, Patient Position: Sitting, Cuff Size: Large)   Pulse 78   Temp 98 F (36.7 C) (Oral)   Resp 18   Ht 5\' 4"  (1.626 m)   Wt (!) 317 lb 9.6 oz (144.1 kg)   LMP 06/11/2012   SpO2 96%   BMI 54.52 kg/m        Objective:   Physical Exam  Constitutional: She is oriented to  person, place, and time. She appears well-developed and well-nourished.  HENT:  Mild thrush noted on posterior tongue  Cardiovascular: Normal rate, regular rhythm and normal heart sounds.  No murmur heard. Pulmonary/Chest: Effort normal and breath sounds normal. No respiratory distress. She has no wheezes.  Musculoskeletal: She exhibits edema.  Neurological: She is alert and oriented to person, place, and time.  Skin: Skin is warm and dry.  Psychiatric: She has a normal mood and affect. Her behavior is normal. Judgment and thought content normal.          Assessment & Plan:  Pre-operative evaluation- obtain routine lab work, chest xray and ekg.    Oral thrush- mild, advised her to begin nystatin.    EKG is personally reviewed. Notes NSR,few PAC's, unchanged compared to previous.  No acute changes.  Clearance pending review of above studies.   Addendum:   Labs/studies reviewed as above.  Pt medically cleared to proceed with planned surgery.

## 2018-02-16 NOTE — Progress Notes (Signed)
[name] [name] NP 

## 2018-02-17 LAB — URINE CULTURE
MICRO NUMBER: 91145999
SPECIMEN QUALITY:: ADEQUATE

## 2018-02-17 NOTE — Telephone Encounter (Signed)
I received an email from [place] from [facility].  She stated we need to do patient's surgery at a different facility because her BMI is too high.   "I'm calling to let you know we need to cancel your surgery that is scheduled for October 2 at [facility].  Your BMI is tooo hight to have it done there.  It will have to be done at [facility] and Dr. Jacqualyn Posey does not have anytime available until November.  With it being done at [facility], they require a history and physical form to be completed by your primary care doctor.  "I have an appointment scheduled for tomorrow to see my doctor."    The form has to be filled within a 30 day window.  Dr. Leigh Aurora next available surgery date is in November.  He can do it on November 6.  However the window to have it done will be October 6 to November 6.  Is this date okay with you?  No, this is just too much.  I will just go somewhere else."  (The patient hung up the phone.)

## 2018-02-19 ENCOUNTER — Encounter (HOSPITAL_COMMUNITY): Payer: 59

## 2018-02-22 ENCOUNTER — Encounter: Payer: 59 | Admitting: Podiatry

## 2018-02-22 DIAGNOSIS — N200 Calculus of kidney: Secondary | ICD-10-CM | POA: Diagnosis not present

## 2018-02-24 DIAGNOSIS — M79671 Pain in right foot: Secondary | ICD-10-CM | POA: Diagnosis not present

## 2018-02-24 DIAGNOSIS — M2031 Hallux varus (acquired), right foot: Secondary | ICD-10-CM | POA: Diagnosis not present

## 2018-02-24 DIAGNOSIS — I890 Lymphedema, not elsewhere classified: Secondary | ICD-10-CM | POA: Diagnosis not present

## 2018-02-24 DIAGNOSIS — M24874 Other specific joint derangements of right foot, not elsewhere classified: Secondary | ICD-10-CM | POA: Diagnosis not present

## 2018-02-24 DIAGNOSIS — M65871 Other synovitis and tenosynovitis, right ankle and foot: Secondary | ICD-10-CM | POA: Diagnosis not present

## 2018-02-25 ENCOUNTER — Telehealth: Payer: Self-pay | Admitting: *Deleted

## 2018-02-25 NOTE — Telephone Encounter (Signed)
"  I am looking to see what date I'm supposed to have surgery.  I believe it's in November.  Please give me a call back at your earliest convenience."  I do not have you scheduled for surgery in November.  When I spoke to you last you stated you had enough and you hung the phone up.  "Yeah, well I'd like to go ahead and schedule it for November."  I am not able to find a Wednesday available, which is Dr. Leigh Aurora assigned surgery day, to do your surgery.  He said he could possibly refer you to Dr. Cannon Kettle.  [facility] has some Mondays available.  We received the history and physical form from your Nurse Practitioner.  Your surgery needs to be done between now and October 24.  Will you be okay with the referral to Dr. Cannon Kettle?  "Yes, that will be fine."  You will need to see her for a consultation.  Would you like me to schedule an appointment for you with her?  "Yes, that would be great."  She can see you on Tuesday, October 8 at 11 am.  "Okay, I'll see her then."  Dr. Cannon Kettle can possibly do your surgery on March 15, 2018.  "That sounds great, thank you so much."

## 2018-02-25 NOTE — Telephone Encounter (Signed)
"  I was wondering what day my surgery is.  Could you give me a call back?"

## 2018-02-26 ENCOUNTER — Telehealth: Payer: Self-pay | Admitting: *Deleted

## 2018-02-26 NOTE — Telephone Encounter (Signed)
I called the patient and informed her that I tried to get her scheduled for surgery on October 21 with Dr. Cannon Kettle.  I told her I was trying to prevent her from needing another history and physical form from her primary care physician.  However, Dr. Cannon Kettle is not doing surgery on October 21 at [facility].  I informed her that we would need to do it in November and that Dr. Jacqualyn Posey would be doing the procedure.  The next available date will be November 13.  She was okay with that date and I made her aware that she would need the form completed again.  I also scheduled her an appointment with Dr. Jacqualyn Posey on October 29 for follow-up.

## 2018-02-26 NOTE — Telephone Encounter (Signed)
I sent a message to Dr. Cannon Kettle asking her if she would accept this patient.

## 2018-03-01 ENCOUNTER — Encounter: Payer: 59 | Admitting: Podiatry

## 2018-03-01 DIAGNOSIS — N200 Calculus of kidney: Secondary | ICD-10-CM | POA: Diagnosis not present

## 2018-03-02 ENCOUNTER — Ambulatory Visit: Payer: 59 | Admitting: Sports Medicine

## 2018-03-11 ENCOUNTER — Encounter: Payer: 59 | Admitting: Podiatry

## 2018-03-11 ENCOUNTER — Telehealth: Payer: Self-pay | Admitting: *Deleted

## 2018-03-11 NOTE — Telephone Encounter (Signed)
Received FMLA/STD paperwork from [facility], completed as much as possible; forwarded to provider/SLS 10/17

## 2018-03-17 ENCOUNTER — Ambulatory Visit (INDEPENDENT_AMBULATORY_CARE_PROVIDER_SITE_OTHER): Payer: 59

## 2018-03-17 DIAGNOSIS — E538 Deficiency of other specified B group vitamins: Secondary | ICD-10-CM | POA: Diagnosis not present

## 2018-03-17 MED ORDER — CYANOCOBALAMIN 1000 MCG/ML IJ SOLN
1000.0000 ug | Freq: Once | INTRAMUSCULAR | Status: AC
  Administered 2018-03-17: 1000 ug via INTRAMUSCULAR

## 2018-03-17 NOTE — Progress Notes (Signed)
Pre visit review using our clinic tool,if applicable. No additional management support is needed unless otherwise documented below in the visit note.   Pt here for monthly B12 injection per Debbrah Alar NP  B12 1077mcg given IM right deltoid,  pt tolerated injection well.  Next B12 injection scheduled for 1 month.

## 2018-03-17 NOTE — Progress Notes (Signed)
Reviewed. ° ° °Sakoya Win [name] NP °

## 2018-03-22 ENCOUNTER — Telehealth: Payer: Self-pay | Admitting: Family

## 2018-03-22 NOTE — Telephone Encounter (Signed)
I am working on her [employer] disability.  Originally we wrote her out due to her kidney stone and related illness.  Is she now out because of her foot and upcoming surgery?  Has she returned to work. When does she plan to return?

## 2018-03-22 NOTE — Telephone Encounter (Signed)
Spoke with pt. She states they just needed to know her return to work date from the kidney stone. She has already returned to work. Return date was 03/04/18.

## 2018-03-23 ENCOUNTER — Encounter: Payer: Self-pay | Admitting: Podiatry

## 2018-03-23 ENCOUNTER — Ambulatory Visit (INDEPENDENT_AMBULATORY_CARE_PROVIDER_SITE_OTHER): Payer: 59

## 2018-03-23 ENCOUNTER — Ambulatory Visit: Payer: 59 | Admitting: Podiatry

## 2018-03-23 DIAGNOSIS — M2031 Hallux varus (acquired), right foot: Secondary | ICD-10-CM

## 2018-03-23 DIAGNOSIS — M79671 Pain in right foot: Secondary | ICD-10-CM | POA: Diagnosis not present

## 2018-03-23 NOTE — Patient Instructions (Signed)
Pre-Operative Instructions  Congratulations, you have decided to take an important step towards improving your quality of life.  You can be assured that the doctors and staff at [facility] will be with you every step of the way.  Here are some important things you should know:  1. Plan to be at the surgery center/hospital at least 1 (one) hour prior to your scheduled time, unless otherwise directed by the surgical center/hospital staff.  You must have a responsible adult accompany you, remain during the surgery and drive you home.  Make sure you have directions to the surgical center/hospital to ensure you arrive on time. 2. If you are having surgery at [facility] or [facility], you will need a copy of your medical history and physical form from your family physician within one month prior to the date of surgery. We will give you a form for your primary physician to complete.  3. We make every effort to accommodate the date you request for surgery.  However, there are times where surgery dates or times have to be moved.  We will contact you as soon as possible if a change in schedule is required.   4. No aspirin/ibuprofen for one week before surgery.  If you are on aspirin, any non-steroidal anti-inflammatory medications (Mobic, Aleve, Ibuprofen) should not be taken seven (7) days prior to your surgery.  You make take Tylenol for pain prior to surgery.  5. Medications - If you are taking daily heart and blood pressure medications, seizure, reflux, allergy, asthma, anxiety, pain or diabetes medications, make sure you notify the surgery center/hospital before the day of surgery so they can tell you which medications you should take or avoid the day of surgery. 6. No food or drink after midnight the night before surgery unless directed otherwise by surgical center/hospital staff. 7. No alcoholic beverages 24-hours prior to surgery.  No smoking 24-hours prior or 24-hours after  surgery. 8. Wear loose pants or shorts. They should be loose enough to fit over bandages, boots, and casts. 9. Don't wear slip-on shoes. Sneakers are preferred. 10. Bring your boot with you to the surgery center/hospital.  Also bring crutches or a walker if your physician has prescribed it for you.  If you do not have this equipment, it will be provided for you after surgery. 11. If you have not been contacted by the surgery center/hospital by the day before your surgery, call to confirm the date and time of your surgery. 12. Leave-time from work may vary depending on the type of surgery you have.  Appropriate arrangements should be made prior to surgery with your employer. 13. Prescriptions will be provided immediately following surgery by your doctor.  Fill these as soon as possible after surgery and take the medication as directed. Pain medications will not be refilled on weekends and must be approved by the doctor. 14. Remove nail polish on the operative foot and avoid getting pedicures prior to surgery. 15. Wash the night before surgery.  The night before surgery wash the foot and leg well with water and the antibacterial soap provided. Be sure to pay special attention to beneath the toenails and in between the toes.  Wash for at least three (3) minutes. Rinse thoroughly with water and dry well with a towel.  Perform this wash unless told not to do so by your physician.  Enclosed: 1 Ice pack (please put in freezer the night before surgery)   1 Hibiclens skin cleaner     Pre-op instructions  If you have any questions regarding the instructions, please do not hesitate to call our office.  [place]: [address], [place], [place] [address] -- [phone]  [place]: [address] [place], [place] [address] -- [phone]  [place]: [address]  [place], [place] [address] -- [phone]  [place]: [address], [address], [place],  [address] -- [phone]  Website:  [email or web address] 

## 2018-03-24 ENCOUNTER — Encounter: Payer: Self-pay | Admitting: Hematology & Oncology

## 2018-03-24 ENCOUNTER — Inpatient Hospital Stay (HOSPITAL_BASED_OUTPATIENT_CLINIC_OR_DEPARTMENT_OTHER): Payer: 59 | Admitting: Hematology & Oncology

## 2018-03-24 ENCOUNTER — Other Ambulatory Visit: Payer: Self-pay

## 2018-03-24 ENCOUNTER — Other Ambulatory Visit: Payer: Self-pay | Admitting: Family

## 2018-03-24 ENCOUNTER — Inpatient Hospital Stay: Payer: 59 | Attending: Hematology & Oncology

## 2018-03-24 VITALS — BP 139/77 | HR 89 | Temp 98.0°F | Resp 20 | Wt 320.0 lb

## 2018-03-24 DIAGNOSIS — D510 Vitamin B12 deficiency anemia due to intrinsic factor deficiency: Secondary | ICD-10-CM

## 2018-03-24 DIAGNOSIS — Z9884 Bariatric surgery status: Secondary | ICD-10-CM | POA: Insufficient documentation

## 2018-03-24 DIAGNOSIS — K912 Postsurgical malabsorption, not elsewhere classified: Secondary | ICD-10-CM | POA: Insufficient documentation

## 2018-03-24 DIAGNOSIS — Z79899 Other long term (current) drug therapy: Secondary | ICD-10-CM

## 2018-03-24 DIAGNOSIS — D500 Iron deficiency anemia secondary to blood loss (chronic): Secondary | ICD-10-CM | POA: Insufficient documentation

## 2018-03-24 LAB — CMP (CANCER CENTER ONLY)
ALBUMIN: 3.5 g/dL (ref 3.5–5.0)
ALK PHOS: 125 U/L (ref 38–126)
ALT: 28 U/L (ref 0–44)
AST: 23 U/L (ref 15–41)
Anion gap: 12 (ref 5–15)
BILIRUBIN TOTAL: 0.6 mg/dL (ref 0.3–1.2)
BUN: 15 mg/dL (ref 6–20)
CO2: 32 mmol/L (ref 22–32)
Calcium: 7.9 mg/dL — ABNORMAL LOW (ref 8.9–10.3)
Chloride: 96 mmol/L — ABNORMAL LOW (ref 98–111)
Creatinine: 0.89 mg/dL (ref 0.44–1.00)
GFR, Est AFR Am: >60 mL/min (ref >60)
GLUCOSE: 102 mg/dL — AB (ref 70–99)
POTASSIUM: 3.5 mmol/L (ref 3.5–5.1)
Sodium: 140 mmol/L (ref 135–145)
TOTAL PROTEIN: 7.6 g/dL (ref 6.5–8.1)

## 2018-03-24 LAB — CBC WITH DIFFERENTIAL (CANCER CENTER ONLY)
Abs Immature Granulocytes: 0.1 K/uL — ABNORMAL HIGH (ref 0.00–0.07)
Basophils Absolute: 0 K/uL (ref 0.0–0.1)
Basophils Relative: 0 %
Eosinophils Absolute: 0.3 K/uL (ref 0.0–0.5)
Eosinophils Relative: 3 %
HCT: 43.2 % (ref 36.0–46.0)
Hemoglobin: 12.9 g/dL (ref 12.0–15.0)
Immature Granulocytes: 1 %
Lymphocytes Relative: 9 %
Lymphs Abs: 1.1 K/uL (ref 0.7–4.0)
MCH: 26.4 pg (ref 26.0–34.0)
MCHC: 29.9 g/dL — ABNORMAL LOW (ref 30.0–36.0)
MCV: 88.5 fL (ref 80.0–100.0)
Monocytes Absolute: 0.6 K/uL (ref 0.1–1.0)
Monocytes Relative: 5 %
Neutro Abs: 9.2 K/uL — ABNORMAL HIGH (ref 1.7–7.7)
Neutrophils Relative %: 82 %
Platelet Count: 135 K/uL — ABNORMAL LOW (ref 150–400)
RBC: 4.88 MIL/uL (ref 3.87–5.11)
RDW: 15.3 % (ref 11.5–15.5)
WBC Count: 11.3 K/uL — ABNORMAL HIGH (ref 4.0–10.5)
nRBC: 0 % (ref 0.0–0.2)

## 2018-03-24 LAB — LACTATE DEHYDROGENASE: LDH: 301 U/L — ABNORMAL HIGH (ref 98–192)

## 2018-03-24 NOTE — Telephone Encounter (Signed)
Received FMLA/STD paperwork from [employer], completed as much as possible; forwarded to provider/SLS 10/30

## 2018-03-24 NOTE — Progress Notes (Signed)
Hematology and Oncology Follow Up Visit  [name] [id number] Sep 16, 1955 62 y.o. 03/24/2018   Principle Diagnosis:  Iron deficiency anemia Pernicious anemia Gastric bypass with malabsorption  Current Therapy:   IV iron as indicated - last received in November 2018 Vitamin B-12 1 mg IM every month - with PCP   Interim History:  [name] is here today for follow-up.  She will be having surgery for the osteomyelitis on the left foot in November.  I am not sure why it was not done in July which she was scheduled..  She is doing okay otherwise.  She is had no bleeding.  She does feel tired.  Apparently, she had a lot of kidney stone issues this summer.  She had kidney stone removal via ureteroscopy.  This was done in the OR.  She is had no cough.  There is been no fever.  She is had no obvious change in bowel or bladder habits.  She is doing well with the vitamin B12.  This is being given by her family doctor.  Her last iron studies back in May showed a ferritin of 1400 with an iron saturation of only 24%.   Overall, I said that her performance status is ECOG 2.  Medications:  Allergies as of 03/24/2018      Reactions   Other Anaphylaxis   NUTS   Ciprofloxacin    Rash nausea   Penicillins Other (See Comments)   Headache. And hives  Has patient had a PCN reaction causing immediate rash, facial/tongue/throat swelling, SOB or lightheadedness with hypotension: No Has patient had a PCN reaction causing severe rash involving mucus membranes or skin necrosis: No Has patient had a PCN reaction that required hospitalization: No Has patient had a PCN reaction occurring within the last 10 years: Yes If all of the above answers are "NO", then may proceed with Cephalosporin use.   Amoxicillin-pot Clavulanate Other (See Comments)   headache   Food Hives   Potato   Tomato Hives      Medication List        Accurate as of 03/24/18 10:56 AM. Always use your most recent med  list.          acetaminophen 325 MG tablet Commonly known as:  TYLENOL Take 2 tablets (650 mg total) by mouth every 6 (six) hours as needed for mild pain or moderate pain.   albuterol 108 (90 Base) MCG/ACT inhaler Commonly known as:  PROVENTIL HFA;VENTOLIN HFA Inhale 2 puffs into the lungs every 6 (six) hours as needed for wheezing or shortness of breath.   albuterol (2.5 MG/3ML) 0.083% nebulizer solution Commonly known as:  PROVENTIL Take 3 mLs (2.5 mg total) by nebulization every 6 (six) hours as needed for wheezing or shortness of breath.   calcitRIOL 0.5 MCG capsule Commonly known as:  ROCALTROL Take 2 mcg by mouth daily.   calcium carbonate 500 MG chewable tablet Commonly known as:  TUMS - dosed in mg elemental calcium Chew 2 tablets (400 mg of elemental calcium total) by mouth 4 (four) times daily.   citric acid-potassium citrate 1100-334 MG/5ML solution Commonly known as:  POLYCITRA Take 1 mL by mouth 2 (two) times daily.   desoximetasone 0.05 % cream Commonly known as:  TOPICORT Apply topically 2 (two) times daily as needed.   diltiazem 240 MG 24 hr capsule Commonly known as:  CARDIZEM CD TAKE 1 CAPSULE BY MOUTH  DAILY   diphenhydrAMINE 25 mg capsule Commonly known as:  BENADRYL Take 25 mg by mouth every 6 (six) hours as needed for itching.   fluticasone 50 MCG/ACT nasal spray Commonly known as:  FLONASE Place 1 spray into both nostrils daily.   furosemide 40 MG tablet Commonly known as:  LASIX TAKE 2 TABLETS BY MOUTH  DAILY IN THE MORNING AND 1  TABLET IN THE EVENING   HYDROcodone-acetaminophen 5-325 MG tablet Commonly known as:  NORCO/VICODIN Take 1-2 tablets by mouth every 6 (six) hours as needed.   levocetirizine 5 MG tablet Commonly known as:  XYZAL TAKE 1 TABLET BY MOUTH  EVERY EVENING   levothyroxine 100 MCG tablet Commonly known as:  SYNTHROID, LEVOTHROID Take 350 mcg by mouth daily.   loratadine 10 MG tablet Commonly known as:   CLARITIN Take 10 mg by mouth every morning.   Magnesium 125 MG Caps Take 125 mg by mouth 2 (two) times daily.   Melatonin 5 MG Tabs Take 5 mg by mouth at bedtime.   mometasone 0.1 % ointment Commonly known as:  ELOCON Apply 1 application topically 2 (two) times daily.   nystatin 100000 UNIT/ML suspension Commonly known as:  MYCOSTATIN Take 5 mLs (500,000 Units total) by mouth 4 (four) times daily.   nystatin powder Commonly known as:  MYCOSTATIN/NYSTOP Apply topically 3 (three) times daily.   omeprazole 40 MG capsule Commonly known as:  PRILOSEC TAKE 1 CAPSULE BY MOUTH  DAILY   ondansetron 4 MG tablet Commonly known as:  ZOFRAN Take 1 tablet (4 mg total) by mouth every 8 (eight) hours as needed for nausea or vomiting.   phenazopyridine 200 MG tablet Commonly known as:  PYRIDIUM Take 1 tablet (200 mg total) by mouth 3 (three) times daily as needed for pain.   saccharomyces boulardii 250 MG capsule Commonly known as:  FLORASTOR Take 1 capsule (250 mg total) by mouth 2 (two) times daily.   SYMBICORT 160-4.5 MCG/ACT inhaler Generic drug:  budesonide-formoterol INHALE 2 PUFFS TWO TIMES  DAILY   valACYclovir 1000 MG tablet Commonly known as:  VALTREX 2 tabs by mouth now and again in 12 hours   VITAMIN B-12 IJ Inject 1,000 mcg as directed every 30 (thirty) days.   Vitamin D (Ergocalciferol) 50000 units Caps capsule Commonly known as:  DRISDOL TAKE 1 CAPSULE BY MOUTH 3  TIMES WEEKLY   zafirlukast 20 MG tablet Commonly known as:  ACCOLATE TAKE 1 TABLET BY MOUTH TWO  TIMES DAILY BEFORE MEALS       Allergies:  Allergies  Allergen Reactions  . Other Anaphylaxis    NUTS  . Ciprofloxacin     Rash nausea  . Penicillins Other (See Comments)    Headache. And hives  Has patient had a PCN reaction causing immediate rash, facial/tongue/throat swelling, SOB or lightheadedness with hypotension: No Has patient had a PCN reaction causing severe rash involving mucus  membranes or skin necrosis: No Has patient had a PCN reaction that required hospitalization: No Has patient had a PCN reaction occurring within the last 10 years: Yes If all of the above answers are "NO", then may proceed with Cephalosporin use.   [name] Amoxicillin-Pot Clavulanate Other (See Comments)    headache  . Food Hives    Potato  . Tomato Hives    Past Medical History, Surgical history, Social history, and Family History were reviewed and updated.  Review of Systems: Review of Systems  Constitutional: Negative.   HENT: Negative.   Eyes: Negative.   Respiratory: Negative.   Cardiovascular: Negative.  Gastrointestinal: Negative.   Genitourinary: Negative.   Musculoskeletal: Positive for joint pain.  Skin: Negative.   Neurological: Negative.   Endo/Heme/Allergies: Bruises/bleeds easily.  Psychiatric/Behavioral: Negative.      Physical Exam:  weight is 320 lb (145.2 kg) (abnormal). Her oral temperature is 98 F (36.7 C). Her blood pressure is 139/77 and her pulse is 89. Her respiration is 20 and oxygen saturation is 98%.   Wt Readings from Last 3 Encounters:  03/24/18 (!) 320 lb (145.2 kg)  02/16/18 (!) 317 lb 9.6 oz (144.1 kg)  01/27/18 (!) 316 lb 12.8 oz (143.7 kg)    Physical Exam  Constitutional: She is oriented to person, place, and time.  HENT:  Head: Normocephalic and atraumatic.  Mouth/Throat: Oropharynx is clear and moist.  Eyes: Pupils are equal, round, and reactive to light. EOM are normal.  Neck: Normal range of motion.  Cardiovascular: Normal rate, regular rhythm and normal heart sounds.  Pulmonary/Chest: Effort normal and breath sounds normal.  Abdominal: Soft. Bowel sounds are normal.  Musculoskeletal: Normal range of motion. She exhibits no edema, tenderness or deformity.  Lymphadenopathy:    She has no cervical adenopathy.  Neurological: She is alert and oriented to person, place, and time.  Skin: Skin is warm and dry. No rash noted. No  erythema.  Psychiatric: She has a normal mood and affect. Her behavior is normal. Judgment and thought content normal.  Vitals reviewed.   Lab Results  Component Value Date   WBC 11.3 (H) 03/24/2018   HGB 12.9 03/24/2018   HCT 43.2 03/24/2018   MCV 88.5 03/24/2018   PLT 135 (L) 03/24/2018   Lab Results  Component Value Date   FERRITIN 1,413 (H) 10/14/2017   IRON 69 10/14/2017   TIBC 285 10/14/2017   UIBC 216 10/14/2017   IRONPCTSAT 24 10/14/2017   Lab Results  Component Value Date   RETICCTPCT 2.0 10/14/2017   RBC 4.88 03/24/2018   RETICCTABS 131.4 03/30/2015   No results found for: Nils Pyle [facility] Lab Results  Component Value Date   IGGSERUM 1250 06/09/2007   IGA 334 06/09/2007   IGMSERUM 244 06/09/2007   Lab Results  Component Value Date   TOTALPROTELP 7.0 06/09/2007     Chemistry      Component Value Date/Time   NA 137 02/16/2018 1202   NA 144 03/30/2017 1455   NA 139 07/30/2015 1302   K 3.5 02/16/2018 1202   K 3.7 03/30/2017 1455   K 3.5 07/30/2015 1302   CL 95 (L) 02/16/2018 1202   CL 99 03/30/2017 1455   CO2 32 02/16/2018 1202   CO2 30 03/30/2017 1455   CO2 25 07/30/2015 1302   BUN 17 02/16/2018 1202   BUN 14 03/30/2017 1455   BUN 16.8 07/30/2015 1302   CREATININE 0.84 02/16/2018 1202   CREATININE 0.83 10/14/2017 0850   CREATININE 0.8 03/30/2017 1455   CREATININE 0.8 07/30/2015 1302      Component Value Date/Time   CALCIUM 7.6 (L) 02/16/2018 1202   CALCIUM 8.7 03/30/2017 1455   CALCIUM 8.8 07/30/2015 1302   ALKPHOS 110 02/16/2018 1202   ALKPHOS 107 (H) 03/30/2017 1455   ALKPHOS 99 07/30/2015 1302   AST 25 02/16/2018 1202   AST 21 10/14/2017 0850   AST 36 (H) 07/30/2015 1302   ALT 38 (H) 02/16/2018 1202   ALT 34 10/14/2017 0850   ALT 28 03/30/2017 1455   ALT 68 (H) 07/30/2015 1302   BILITOT 0.6 02/16/2018 1202  BILITOT 0.6 10/14/2017 0850   BILITOT 0.41 07/30/2015 1302      Impression and Plan: Ms. Cotterman is a  very pleasant 62 yo caucasian female with iron deficiency anemia secondary to malabsorption after gastric bypass. She also has pernicious anemia and receives monthly B 12 injections with her PCP.   I it is possible that she might need iron.  We will see what her iron studies show.  I would like to make sure that her iron levels are okay before she has any surgery.  I will plan to get her back in 4 months.  I think this would be reasonable.  From my point of view, I do not see any issue with her having surgery in November.    Volanda Napoleon, MD 10/30/201910:56 AM

## 2018-03-24 NOTE — Progress Notes (Signed)
Subjective: 62 year old female presents the office today for surgical consultation to the right foot given hallux varus.  She states is been getting worse over time and this is causing her quality of life.  She is concerned that if it continues to worsen her quality of life as it continued to deteriorate and she was going to proceed with surgery.  She had a cancel due to other sicknesses.  However she has been cleared to have surgery at this point. Denies any systemic complaints such as fevers, chills, nausea, vomiting. No acute changes since last appointment, and no other complaints at this time.   Objective: AAO x3, NAD DP/PT pulses palpable bilaterally, CRT less than 3 seconds Hallux varus is present bilaterally.  There is tenderness on the first MPJ and is crepitation with MPJ range of motion however the deformity is still somewhat reducible but not completely into a rectus position.  There is tenderness of the toe and she states this is not having her quality of life she cannot deal with this any longer.  No open lesions or pre-ulcerative lesions.  No pain with calf compression, swelling, warmth, erythema  Assessment: Symptomatic hallux varus right foot  Plan: -All treatment options discussed with the patient including all alternatives, risks, complications.  -Repeat x-rays were obtained reviewed.  Hardware is present and there is slight worsening compared to previous x-rays.  We again discussed the surgery as well as postoperative course we discussed alternatives, risks, complications.  At this point she wished to proceed with surgery given her quality of life and is been ongoing for some time.  Discussed first MPJ fusion of the right foot.  She understands that she is a high risk of amputation and need for further surgery and delayed or nonhealing among other risks as detailed below.  She wishes to proceed. -The incision placement as well as the postoperative course was discussed with the  patient. I discussed risks of the surgery which include, but not limited to, infection, bleeding, pain, swelling, need for further surgery, delayed or nonhealing, painful or ugly scar, numbness or sensation changes, over/under correction, recurrence, transfer lesions, further deformity, hardware failure, DVT/PE, loss of toe/foot. Patient understands these risks and wishes to proceed with surgery. The surgical consent was reviewed with the patient all 3 pages were signed. No promises or guarantees were given to the outcome of the procedure. All questions were answered to the best of my ability. Before the surgery the patient was encouraged to call the office if there is any further questions. The surgery will be performed at [facility] on an outpatient basis. -She has had arterial studies completed prior to surgery. -We will also [facility] getting [facility], physical therapy postoperatively.  She has help at home shortly after surgery she has a wheelchair.  Discussed with her sitter rehab however likely not covered by insurance given particular surgery.  This will be planned for an outpatient surgery. -Patient encouraged to call the office with any questions, concerns, change in symptoms.   Trula Slade DPM

## 2018-03-25 LAB — IRON AND TIBC
IRON: 47 ug/dL (ref 41–142)
Saturation Ratios: 17 % — ABNORMAL LOW (ref 21–57)
TIBC: 270 ug/dL (ref 236–444)
UIBC: 223 ug/dL (ref 120–384)

## 2018-03-25 LAB — FERRITIN: Ferritin: 2078 ng/mL — ABNORMAL HIGH (ref 11–307)

## 2018-03-26 ENCOUNTER — Telehealth: Payer: Self-pay | Admitting: *Deleted

## 2018-03-26 DIAGNOSIS — N200 Calculus of kidney: Secondary | ICD-10-CM | POA: Diagnosis not present

## 2018-03-26 NOTE — Telephone Encounter (Signed)
Notified pt per MD- Iron is low. I dose of IV Iron is needed. Pt to expect a call from scheduling. Pt verbalized understanding, no further concerns. Message to scheduling,.

## 2018-03-26 NOTE — Telephone Encounter (Signed)
-----   Message from Volanda Napoleon, MD sent at 03/25/2018  4:30 PM EDT ----- Call - the iron is l;ow!!  Need 1 dose of iv iron!!  Laurey Arrow

## 2018-03-29 ENCOUNTER — Encounter: Payer: Self-pay | Admitting: Family

## 2018-03-29 ENCOUNTER — Ambulatory Visit (HOSPITAL_COMMUNITY)
Admission: RE | Admit: 2018-03-29 | Discharge: 2018-03-29 | Disposition: A | Discharge status: Home / Self Care | Payer: 59 | Source: Ambulatory Visit | Attending: Family | Admitting: Family

## 2018-03-29 ENCOUNTER — Ambulatory Visit (INDEPENDENT_AMBULATORY_CARE_PROVIDER_SITE_OTHER): Payer: 59 | Admitting: Family

## 2018-03-29 ENCOUNTER — Other Ambulatory Visit: Payer: Self-pay | Admitting: Podiatry

## 2018-03-29 VITALS — BP 132/70 | HR 87 | Temp 98.0°F | Resp 20 | Ht 64.0 in | Wt 318.2 lb

## 2018-03-29 DIAGNOSIS — Z87891 Personal history of nicotine dependence: Secondary | ICD-10-CM | POA: Insufficient documentation

## 2018-03-29 DIAGNOSIS — Z01818 Encounter for other preprocedural examination: Secondary | ICD-10-CM | POA: Diagnosis not present

## 2018-03-29 DIAGNOSIS — R0989 Other specified symptoms and signs involving the circulatory and respiratory systems: Secondary | ICD-10-CM | POA: Diagnosis not present

## 2018-03-29 DIAGNOSIS — I10 Essential (primary) hypertension: Secondary | ICD-10-CM | POA: Insufficient documentation

## 2018-03-29 LAB — CBC WITH DIFFERENTIAL/PLATELET
BASOS PCT: 0.5 % (ref 0.0–3.0)
Basophils Absolute: 0 10*3/uL (ref 0.0–0.1)
EOS ABS: 0.3 10*3/uL (ref 0.0–0.7)
Eosinophils Relative: 2.4 % (ref 0.0–5.0)
HEMATOCRIT: 40 % (ref 36.0–46.0)
HEMOGLOBIN: 13.1 g/dL (ref 12.0–15.0)
Lymphocytes Relative: 10.1 % — ABNORMAL LOW (ref 12.0–46.0)
Lymphs Abs: 1 10*3/uL (ref 0.7–4.0)
MCHC: 32.8 g/dL (ref 30.0–36.0)
MCV: 84.7 fl (ref 78.0–100.0)
Monocytes Absolute: 0.5 10*3/uL (ref 0.1–1.0)
Monocytes Relative: 4.8 % (ref 3.0–12.0)
Neutro Abs: 8.5 10*3/uL — ABNORMAL HIGH (ref 1.4–7.7)
Neutrophils Relative %: 82.2 % — ABNORMAL HIGH (ref 43.0–77.0)
Platelets: 190 10*3/uL (ref 150.0–400.0)
RBC: 4.72 Mil/uL (ref 3.87–5.11)
RDW: 16.3 % — AB (ref 11.5–15.5)
WBC: 10.3 10*3/uL (ref 4.0–10.5)

## 2018-03-29 LAB — COMPREHENSIVE METABOLIC PANEL
ALBUMIN: 3.9 g/dL (ref 3.5–5.2)
ALT: 26 U/L (ref 0–35)
AST: 21 U/L (ref 0–37)
Alkaline Phosphatase: 119 U/L — ABNORMAL HIGH (ref 39–117)
BUN: 19 mg/dL (ref 6–23)
CHLORIDE: 95 meq/L — AB (ref 96–112)
CO2: 31 mEq/L (ref 19–32)
Calcium: 7.4 mg/dL — ABNORMAL LOW (ref 8.4–10.5)
Creatinine, Ser: 0.8 mg/dL (ref 0.40–1.20)
GFR: 77.54 mL/min (ref >60.00)
Glucose, Bld: 89 mg/dL (ref 70–99)
POTASSIUM: 3.5 meq/L (ref 3.5–5.1)
Sodium: 139 mEq/L (ref 135–145)
Total Bilirubin: 0.6 mg/dL (ref 0.2–1.2)
Total Protein: 7.1 g/dL (ref 6.0–8.3)

## 2018-03-29 NOTE — Patient Instructions (Addendum)
Please complete lab work prior to leaving. Complete chest x-ray on the first floor. Take last dose of Eliquis on AM 11/11. Call if increased congestion or if symptoms do not continue to improve.

## 2018-03-29 NOTE — Progress Notes (Signed)
Subjective:    Patient ID: [name], female    DOB: 1956-05-08, 62 y.o.   MRN: [id number]  HPI   Patient presents today for pre-operative evaluation.  She is scheduled for surgery of the right foot on 04/07/18. She is scheduled for Hallus Fusion of right foot  Notes some chest congestion- has been present for a few days worse in the AM, ok by the evening. Denies associated fever. Notes some mild evening nasal congestion.      Review of Systems See HPI  Past Medical History:  Diagnosis Date  . Abnormal Pap smear    years ago/no biopsy  . Allergic rhinitis   . Anemia, iron deficiency   . Aortic atherosclerosis ([place])   . Arthritis    back- lower  . Asthma   . Atrial fibrillation ([place]) August 2012   OFF XARELTO LAST MONTH DUE TO BLEEDING IN STOOL  . Bursitis of hip left  . Chronic diastolic (congestive) heart failure ([place])   . Dysrhythmia    afib, followed by Dr. Stanford Breed   . Edema of both legs   . Fatty liver 01/07/2012  . Fatty liver   . Fibroid 1974   fibroid cyst on left fallopian tube  . Fibromyalgia   . Foot ulcer ([place])    AREA HEALED RIGHT FOOT  . GERD (gastroesophageal reflux disease)   . Headache(784.0)    occasional sinus headache   . Hepatomegaly   . History of blood transfusion JULY 2015  . History of E. coli septicemia   . History of MRSA infection   . Hypertension   . Hypoparathyroidism ([place]) 01/07/2011  . Hypothyroidism   . Kidney stone 09-21-15   passed on their own  . Morbid obesity ([place])   . Nephrolithiasis 2/16, 9/16   SEES DR Risa Grill  . Pernicious anemia 02/20/2014   followed by Debbrah Alar  . PONV (postoperative nausea and vomiting)   . Rash    thighs and back  . Seizures ([place])    infancy secondary to fever  . Thyroid cancer ([place]) 2011   THYROIDECTOMY DONE  . Uterine cancer ([place]) 2014   Mirena IUD  . Vitamin B 12 deficiency   . Vitamin D deficiency      Social History   Socioeconomic History  . Marital status: Single      Spouse name: Not on file  . Number of children: 0  . Years of education: Not on file  . Highest education level: Not on file  Occupational History  . Occupation: works in Insurance claims handler  . Occupation: DESIGN COMPUTER CHIP    Employer: [employer]  Social Needs  . Financial resource strain: Not on file  . Food insecurity:    Worry: Not on file    Inability: Not on file  . Transportation needs:    Medical: Not on file    Non-medical: Not on file  Tobacco Use  . Smoking status: Former Smoker    Packs/day: 0.50    Years: 25.00    Pack years: 12.50    Types: Cigarettes    Start date: 12/24/1970    Last attempt to quit: 05/27/1995    Years since quitting: 22.8  . Smokeless tobacco: Never Used  . Tobacco comment: quit smoking 19 years ago  Substance and Sexual Activity  . Alcohol use: Yes    Alcohol/week: 0.0 standard drinks    Comment: 1/2 glass per month  . Drug use: No  . Sexual  activity: Yes    Partners: Male    Birth control/protection: Post-menopausal  Lifestyle  . Physical activity:    Days per week: Not on file    Minutes per session: Not on file  . Stress: Not on file  Relationships  . Social connections:    Talks on phone: Not on file    Gets together: Not on file    Attends religious service: Not on file    Active member of club or organization: Not on file    Attends meetings of clubs or organizations: Not on file    Relationship status: Not on file  . Intimate partner violence:    Fear of current or ex partner: Not on file    Emotionally abused: Not on file    Physically abused: Not on file    Forced sexual activity: Not on file  Other Topics Concern  . Not on file  Social History Narrative   Occupation: works in Insurance claims handler - Field seismologist   Single       Former Smoker - quit tobacco 12 years ago.  She was light smoker for 10 years.                 Past Surgical History:  Procedure Laterality Date  . AMPUTATION Right  05/18/2014   Procedure: RIGHT FIFTH RAY AMPUTATION FOOT;  Surgeon: Wylene Simmer, MD;  Location: [facility];  Service: Orthopedics;  Laterality: Right;  . APPENDECTOMY    . BUNIONECTOMY     bilateral  . COLONOSCOPY WITH PROPOFOL N/A 02/02/2014   Procedure: COLONOSCOPY WITH PROPOFOL;  Surgeon: Irene Shipper, MD;  [facility];  Service: Endoscopy;  Laterality: N/A;  . cyst on ovary removed     . CYSTOSCOPY W/ RETROGRADES  03/03/2012   Procedure: CYSTOSCOPY WITH RETROGRADE PYELOGRAM;  Surgeon: Bernestine Amass, MD;  [facility];  Service: Urology;  Laterality: Bilateral;  . CYSTOSCOPY W/ URETERAL STENT PLACEMENT Right 11/25/2017   Procedure: CYSTOSCOPY WITH RETROGRADE RIGHT URETERAL STENT PLACEMENT;  Surgeon: Franchot Gallo, MD;  Location: [facility];  Service: Urology;  Laterality: Right;  . CYSTOSCOPY W/ URETERAL STENT PLACEMENT Right 01/15/2018   Procedure: RIGHT STENT EXCHANGE;  Surgeon: Ardis Hughs, MD;  [facility];  Service: Urology;  Laterality: Right;  . CYSTOSCOPY WITH RETROGRADE PYELOGRAM, URETEROSCOPY AND STENT PLACEMENT Left 08/25/2012   Procedure: CYSTOSCOPY WITH RETROGRADE PYELOGRAM, URETEROSCOPY;  Surgeon: Bernestine Amass, MD;  [facility];  Service: Urology;  Laterality: Left;  . CYSTOSCOPY WITH STENT PLACEMENT Left 01/15/2018   Procedure: LEFT STENT PLACEMENT;  Surgeon: Ardis Hughs, MD;  [facility];  Service: Urology;  Laterality: Left;  . CYSTOSCOPY WITH STENT PLACEMENT Right 01/22/2018   Procedure: RIGHT STENT REMOVAL;  Surgeon: Ardis Hughs, MD;  [facility];  Service: Urology;  Laterality: Right;  . CYSTOSCOPY/URETEROSCOPY/HOLMIUM LASER/STENT PLACEMENT Right 12/23/2017   Procedure: RIGHT URETEROSCOPY STONE REMOVAL, HOLMIUM LASER, RIGHT /STENT EXCHANGE;  Surgeon: Ardis Hughs, MD;  [facility];  Service: Urology;  Laterality: Right;  . CYSTOSCOPY/URETEROSCOPY/HOLMIUM LASER/STENT PLACEMENT Left 01/22/2018   Procedure: LEFT  URETEROSCOPY STONE REMOVAL HOLMIUM LASER LEFT STENT Freddi Starr;  Surgeon: Ardis Hughs, MD;  [facility];  Service: Urology;  Laterality: Left;  . DILATION AND CURETTAGE OF UTERUS N/A 02/21/2013   Procedure: DILATATION AND CURETTAGE;  Surgeon: Lyman Speller, MD;  Location: [facility];  Service: Gynecology;  Laterality: N/A;  . DILATION AND CURETTAGE  OF UTERUS N/A 04/09/2015   Procedure: [place] IUD removal;  Surgeon: Megan Salon, MD;  Location: [facility];  Service: Gynecology;  Laterality: N/A;  Patient weight 307lbs  . ESOPHAGOGASTRODUODENOSCOPY (EGD) WITH PROPOFOL N/A 02/02/2014   Procedure: ESOPHAGOGASTRODUODENOSCOPY (EGD) WITH PROPOFOL;  Surgeon: Irene Shipper, MD;  [facility];  Service: Endoscopy;  Laterality: N/A;  . GASTRIC BYPASS  1974  . HOLMIUM LASER APPLICATION Left 01/26/2354   Procedure: HOLMIUM LASER APPLICATION;  Surgeon: Bernestine Amass, MD;  [facility];  Service: Urology;  Laterality: Left;  . LITHOTRIPSY  03/2012  . LITHOTRIPSY  2/16  . THYROIDECTOMY  05/15/2010  . TONSILLECTOMY AND ADENOIDECTOMY    . URETEROSCOPY  03/03/2012   Procedure: URETEROSCOPY;  Surgeon: Bernestine Amass, MD;  [facility];  Service: Urology;  Laterality: Left;  . URETEROSCOPY WITH HOLMIUM LASER LITHOTRIPSY Bilateral 01/15/2018   Procedure: CYSTOSCOPY/BILATERAL URETEROSCOPY WITH HOLMIUM LASER LITHOTRIPSY STONE REMOVAL;  Surgeon: Ardis Hughs, MD;  [facility];  Service: Urology;  Laterality: Bilateral;    Family History  Problem Relation Age of Onset  . Hypertension Father   . Diabetes Father   . Lung cancer Father   . Heart attack Father        MI at age 20  . Hypertension Mother   . Hyperthyroidism Mother   . Heart disease Mother   . Heart attack Mother        MI at age 85  . Asthma Brother   . Hypertension Brother        younger  . Heart disease Brother        older  . Colon cancer Neg Hx   . Esophageal  cancer Neg Hx   . Stomach cancer Neg Hx   . Kidney disease Neg Hx   . Liver disease Neg Hx     Allergies  Allergen Reactions  . Other Anaphylaxis    NUTS  . Penicillins Other (See Comments)    Headache. And hives  Has patient had a PCN reaction causing immediate rash, facial/tongue/throat swelling, SOB or lightheadedness with hypotension: No Has patient had a PCN reaction causing severe rash involving mucus membranes or skin necrosis: No Has patient had a PCN reaction that required hospitalization: No Has patient had a PCN reaction occurring within the last 10 years: Yes If all of the above answers are "NO", then may proceed with Cephalosporin use.   [name] Amoxicillin-Pot Clavulanate Other (See Comments)    headache  . Ciprofloxacin Nausea Only and Rash  . Food Hives and Other (See Comments)    Potato  . Tomato Hives    Current Outpatient Medications on File Prior to Visit  Medication Sig Dispense Refill  . acetaminophen (TYLENOL) 325 MG tablet Take 2 tablets (650 mg total) by mouth every 6 (six) hours as needed for mild pain or moderate pain. 60 tablet 0  . albuterol (PROVENTIL HFA;VENTOLIN HFA) 108 (90 Base) MCG/ACT inhaler Inhale 2 puffs into the lungs every 6 (six) hours as needed for wheezing or shortness of breath. 1 Inhaler 5  . albuterol (PROVENTIL) (2.5 MG/3ML) 0.083% nebulizer solution Take 3 mLs (2.5 mg total) by nebulization every 6 (six) hours as needed for wheezing or shortness of breath. 150 mL 1  . apixaban (ELIQUIS) 5 MG TABS tablet Take 5 mg by mouth 2 (two) times daily.    . calcitRIOL (ROCALTROL) 0.5 MCG capsule Take 2 mcg by mouth daily.     [name]  calcium elemental as carbonate (TUMS ULTRA 1000) 400 MG chewable tablet Chew 2,000 mg by mouth 2 (two) times daily.    . citric acid-potassium citrate (POLYCITRA) 1100-334 MG/5ML solution Take 1 mL by mouth daily.     . cyanocobalamin (,VITAMIN B-12,) 1000 MCG/ML injection Inject 1,000 mcg into the muscle every 30 (thirty)  days.    [name] desoximetasone (TOPICORT) 0.05 % cream Apply topically 2 (two) times daily as needed. (Patient taking differently: Apply 1 application topically 2 (two) times daily as needed (for rash). ) 60 g 0  . diltiazem (CARDIZEM CD) 240 MG 24 hr capsule TAKE 1 CAPSULE BY MOUTH  DAILY (Patient taking differently: Take 240 mg by mouth daily. ) 90 capsule 1  . diphenhydrAMINE (BENADRYL) 25 mg capsule Take 25 mg by mouth every 6 (six) hours as needed for itching.     . diphenhydrAMINE-PE-APAP (RA SEVERE ALLERGY PLUS SINUS PO) Take 2 tablets by mouth daily as needed (for severe allergy).    [name] EPINEPHrine (EPIPEN 2-PAK) 0.3 mg/0.3 mL IJ SOAJ injection Inject 0.3 mg into the muscle once.    . fluocinonide (LIDEX) 0.05 % external solution Apply 1 application topically 2 (two) times daily as needed (for rash).    . fluticasone (VERAMYST) 27.5 MCG/SPRAY nasal spray Place 2 sprays into the nose daily.    . furosemide (LASIX) 40 MG tablet TAKE 2 TABLETS BY MOUTH  DAILY IN THE MORNING AND 1  TABLET IN THE EVENING (Patient taking differently: Take 40 mg by mouth 2 (two) times daily. ) 270 tablet 1  . HYDROcodone-acetaminophen (NORCO/VICODIN) 5-325 MG tablet Take 1-2 tablets by mouth every 6 (six) hours as needed. 10 tablet 0  . levocetirizine (XYZAL) 5 MG tablet TAKE 1 TABLET BY MOUTH  EVERY EVENING (Patient taking differently: Take 5 mg by mouth every evening. ) 90 tablet 3  . levothyroxine (SYNTHROID, LEVOTHROID) 100 MCG tablet Take 350 mcg by mouth daily before breakfast.     . loratadine (CLARITIN) 10 MG tablet Take 10 mg by mouth every morning.    . Melatonin 5 MG TABS Take 5 mg by mouth at bedtime as needed (for sleep).     . nystatin (MYCOSTATIN) 100000 UNIT/ML suspension Take 5 mLs (500,000 Units total) by mouth 4 (four) times daily. 200 mL 0  . nystatin (MYCOSTATIN/NYSTOP) powder Apply topically 3 (three) times daily. 15 g 0  . omeprazole (PRILOSEC) 40 MG capsule TAKE 1 CAPSULE BY MOUTH  DAILY (Patient  taking differently: Take 40 mg by mouth daily. ) 90 capsule 1  . ondansetron (ZOFRAN) 4 MG tablet Take 1 tablet (4 mg total) by mouth every 8 (eight) hours as needed for nausea or vomiting. 30 tablet 0  . phenazopyridine (PYRIDIUM) 200 MG tablet Take 1 tablet (200 mg total) by mouth 3 (three) times daily as needed for pain. 20 tablet 0  . saccharomyces boulardii (FLORASTOR) 250 MG capsule Take 1 capsule (250 mg total) by mouth 2 (two) times daily. (Patient taking differently: Take 250 mg by mouth daily. ) 30 capsule 0  . SYMBICORT 160-4.5 MCG/ACT inhaler INHALE 2 PUFFS TWO TIMES  DAILY (Patient taking differently: Inhale 2 puffs into the lungs 2 (two) times daily. ) 30.6 g 1  . traMADol (ULTRAM) 50 MG tablet Take 50 mg by mouth every 12 (twelve) hours as needed for moderate pain.    . valACYclovir (VALTREX) 1000 MG tablet 2 tabs by mouth now and again in 12 hours 4 tablet 0  .  Vitamin D, Ergocalciferol, (DRISDOL) 50000 units CAPS capsule TAKE 1 CAPSULE BY MOUTH 3  TIMES WEEKLY (Patient taking differently: Take 50,000 Units by mouth every Monday, Wednesday, and Friday. ) 39 capsule 1  . zafirlukast (ACCOLATE) 20 MG tablet TAKE 1 TABLET BY MOUTH TWO  TIMES DAILY BEFORE MEALS (Patient taking differently: Take 20 mg by mouth 2 (two) times daily before a meal. ) 180 tablet 1   No current facility-administered medications on file prior to visit.     BP 132/70 (BP Location: Right Arm, Cuff Size: Large)   Pulse 87   Temp 98 F (36.7 C) (Oral)   Resp 20   Ht 5\' 4"  (1.626 m)   Wt (!) 318 lb 3.2 oz (144.3 kg)   LMP 06/11/2012   SpO2 98%   BMI 54.62 kg/m       Objective:   Physical Exam  Constitutional: She is oriented to person, place, and time. She appears well-developed and well-nourished.  HENT:  Right Ear: Tympanic membrane and ear canal normal.  Left Ear: Tympanic membrane and ear canal normal.  Neck: Neck supple.  Cardiovascular: Normal rate, regular rhythm and normal heart sounds.  No  murmur heard. Pulmonary/Chest: Effort normal. No respiratory distress.  Mild right sided rhonchi, cleared with cough.  Musculoskeletal:  2+ bilateral LE edema   Lymphadenopathy:    She has no cervical adenopathy.  Neurological: She is alert and oriented to person, place, and time.  Skin: Skin is warm and dry.  Some chronic venous stasis changes noted bilateral LE   Psychiatric: She has a normal mood and affect. Her behavior is normal. Judgment and thought content normal.          Assessment & Plan:  Pre-operative clearance- Will obtain CXR.  I think her mild rhonchi is due to chronic asthma symptoms.  If CXR clear and symptoms remain stable/improved I don't think this should impact her upcoming surgery date. She is to continue symbicort.  Advised pt to take last dose of Eliquis AM 04/05/18. She had an EKG performed in the end of September which is reviewed and was stable.  Obtain follow up lab work.

## 2018-03-30 ENCOUNTER — Telehealth: Payer: Self-pay | Admitting: Family

## 2018-03-30 ENCOUNTER — Encounter (HOSPITAL_COMMUNITY): Payer: Self-pay

## 2018-03-30 ENCOUNTER — Ambulatory Visit (HOSPITAL_BASED_OUTPATIENT_CLINIC_OR_DEPARTMENT_OTHER)
Admission: RE | Admit: 2018-03-30 | Discharge: 2018-03-30 | Disposition: A | Discharge status: Home / Self Care | Payer: 59 | Source: Ambulatory Visit | Attending: Internal Medicine | Admitting: Internal Medicine

## 2018-03-30 DIAGNOSIS — R0989 Other specified symptoms and signs involving the circulatory and respiratory systems: Secondary | ICD-10-CM

## 2018-03-30 DIAGNOSIS — Z01818 Encounter for other preprocedural examination: Secondary | ICD-10-CM | POA: Diagnosis not present

## 2018-03-30 LAB — URINE CULTURE
MICRO NUMBER:: 91323758
SPECIMEN QUALITY:: ADEQUATE

## 2018-03-30 NOTE — Patient Instructions (Addendum)
Your procedure is scheduled on: Wednesday, Nov. 13, 2019   Surgery Time:  1:15PM-2:15PM   Report to [facility]  Entrance    Report to admitting at 11:15 AM   Call this number if you have problems the morning of surgery [phone]   Do not eat food:After Midnight.   May have liquids until 7:00AM morning of surgery  CLEAR LIQUID DIET  Foods Allowed                                                                     Foods Excluded  Water, Black Coffee and tea, regular and decaf                             liquids that you cannot  Plain Jell-O in any flavor                                             see through such as: Fruit ices (not with fruit pulp)                                     milk, soups, orange juice  Iced Popsicles                                    All solid food Carbonated beverages, regular and diet                                    Cranberry, grape and apple juices Sports drinks like Gatorade Lightly seasoned clear broth or consume(fat free) Sugar, honey syrup  Sample Menu Breakfast                                Lunch                                     Supper Cranberry juice                    Beef broth                            Chicken broth Jell-O                                     Grape juice                           Apple juice Coffee or tea  Jell-O                                      Popsicle                                                Coffee or tea                        Coffee or tea   Brush your teeth the morning of surgery.   Do NOT smoke after Midnight   Take these medicines the morning of surgery with A SIP OF WATER: Cardizem, Levothyroxine, Omeprazole, Accolate   Use Asthma Inhaler and Nasal spray per normal routine   Bring Asthma Inhaler day of surgery                               You may not have any metal on your body including hair pins, jewelry, and body piercings             Do not  wear make-up, lotions, powders, perfumes/cologne, or deodorant             Do not wear nail polish.  Do not shave  48 hours prior to surgery.                Do not bring valuables to the hospital. [place].   Contacts, dentures or bridgework may not be worn into surgery.   Patients discharged the day of surgery will not be allowed to drive home.   Special Instructions: Bring a copy of your healthcare power of attorney and living will documents         the day of surgery if you haven't scanned them in before.              Please read over the following fact sheets you were given:  [facility] - Preparing for Surgery Before surgery, you can play an important role.  Because skin is not sterile, your skin needs to be as free of germs as possible.  You can reduce the number of germs on your skin by washing with CHG (chlorahexidine gluconate) soap before surgery.  CHG is an antiseptic cleaner which kills germs and bonds with the skin to continue killing germs even after washing. Please DO NOT use if you have an allergy to CHG or antibacterial soaps.  If your skin becomes reddened/irritated stop using the CHG and inform your nurse when you arrive at Short Stay. Do not shave (including legs and underarms) for at least 48 hours prior to the first CHG shower.  You may shave your face/neck.  Please follow these instructions carefully:  1.  Shower with CHG Soap the night before surgery and the  morning of surgery.  2.  If you choose to wash your hair, wash your hair first as usual with your normal  shampoo.  3.  After you shampoo, rinse your hair and body thoroughly to remove the shampoo.  4.  Use CHG as you would any other liquid soap.  You can apply chg directly to the skin and wash.  Gently with a scrungie or clean washcloth.  5.  Apply the CHG Soap to your body ONLY FROM THE NECK DOWN.   Do   not use on face/ open                            Wound or open sores. Avoid contact with eyes, ears mouth and   genitals (private parts).                       Wash face,  Genitals (private parts) with your normal soap.             6.  Wash thoroughly, paying special attention to the area where your    surgery  will be performed.  7.  Thoroughly rinse your body with warm water from the neck down.  8.  DO NOT shower/wash with your normal soap after using and rinsing off the CHG Soap.                9.  Pat yourself dry with a clean towel.            10.  Wear clean pajamas.            11.  Place clean sheets on your bed the night of your first shower and do not  sleep with pets. Day of Surgery : Do not apply any lotions/deodorants the morning of surgery.  Please wear clean clothes to the hospital/surgery center.  FAILURE TO FOLLOW THESE INSTRUCTIONS MAY RESULT IN THE CANCELLATION OF YOUR SURGERY  PATIENT SIGNATURE_________________________________  NURSE SIGNATURE__________________________________  ________________________________________________________________________

## 2018-03-30 NOTE — Telephone Encounter (Signed)
Calcium is quite low. Please confirm that she is taking tums 1000mg  po bid.  If so, please increase to TID.  Let's see her back in the office on Friday for follow up and repeat lab work.

## 2018-03-30 NOTE — Telephone Encounter (Signed)
Please hold off on calling patient for now.  Tks.

## 2018-03-30 NOTE — Pre-Procedure Instructions (Signed)
The following are in epic: Last office visit note/clearance [place] 03/29/2018 CBC/diff, CMP, Urine Culture 03/29/2018 CXR 03/29/2018 EKG 02/16/2018 ECHO 04/13/2017

## 2018-03-31 ENCOUNTER — Other Ambulatory Visit: Payer: Self-pay

## 2018-03-31 ENCOUNTER — Encounter (HOSPITAL_COMMUNITY): Payer: Self-pay

## 2018-03-31 ENCOUNTER — Encounter (HOSPITAL_COMMUNITY)
Admission: RE | Admit: 2018-03-31 | Discharge: 2018-03-31 | Disposition: A | Discharge status: Home / Self Care | Payer: 59 | Source: Ambulatory Visit | Attending: Podiatry | Admitting: Podiatry

## 2018-03-31 DIAGNOSIS — Z01812 Encounter for preprocedural laboratory examination: Secondary | ICD-10-CM | POA: Diagnosis present

## 2018-03-31 HISTORY — DX: Spondylosis without myelopathy or radiculopathy, thoracic region: M47.814

## 2018-03-31 HISTORY — DX: Pneumonia, unspecified organism: J18.9

## 2018-03-31 HISTORY — DX: Chronic obstructive pulmonary disease, unspecified: J44.9

## 2018-03-31 HISTORY — DX: Other specified symptoms and signs involving the circulatory and respiratory systems: R09.89

## 2018-03-31 HISTORY — DX: Methicillin susceptible Staphylococcus aureus infection, unspecified site: A49.01

## 2018-03-31 HISTORY — DX: Methicillin resistant Staphylococcus aureus infection, unspecified site: A49.02

## 2018-03-31 HISTORY — DX: Personal history of other diseases of the circulatory system: Z86.79

## 2018-03-31 HISTORY — DX: Esophagitis, unspecified: K20.9

## 2018-03-31 HISTORY — DX: Other specified chronic obstructive pulmonary disease: J44.89

## 2018-03-31 LAB — SURGICAL PCR SCREEN
MRSA, PCR: POSITIVE — AB
STAPHYLOCOCCUS AUREUS: POSITIVE — AB

## 2018-03-31 NOTE — Pre-Procedure Instructions (Signed)
MUPIROCIN called in to [facility] spoke with Lorre Nick, Pharmacist.  Ms. Ghanem made aware that she needed to pick up and start the medication. She verbalized understanding.

## 2018-04-01 ENCOUNTER — Telehealth: Payer: Self-pay | Admitting: *Deleted

## 2018-04-01 ENCOUNTER — Encounter (HOSPITAL_COMMUNITY): Payer: Self-pay | Admitting: Anesthesiology

## 2018-04-01 NOTE — Telephone Encounter (Signed)
Spoke to pt.  She is taking tums 1000mg  bid, I advised her to increase to TID and return to lab on Monday for follow up lab work. She would like to come to the lab at [facility], can you please add her to the schedule.

## 2018-04-01 NOTE — Telephone Encounter (Signed)
Dr. Jacqualyn Posey asked me to give you a call regarding your nasal swab.  It did come back positive for MRSA.  "Yes, [facility] called and told me and they prescribed me an antibiotic, which I am taking now."  Dr. Jacqualyn Posey wanted to let you know he has put in a call to infectious disease to inquire if it is okay to proceed with your surgery.  He wants to make sure if it will be okay to put an implant in your foot.  He does not want any complications to occur.  "Okay, that's fine."

## 2018-04-02 ENCOUNTER — Inpatient Hospital Stay: Payer: 59 | Attending: Hematology & Oncology

## 2018-04-02 ENCOUNTER — Other Ambulatory Visit: Payer: Self-pay | Admitting: Podiatry

## 2018-04-02 DIAGNOSIS — D508 Other iron deficiency anemias: Secondary | ICD-10-CM | POA: Insufficient documentation

## 2018-04-02 MED ORDER — CHLORHEXIDINE GLUCONATE CLOTH 2 % EX PADS: 6.0000 | MEDICATED_PAD | Freq: Every day | CUTANEOUS | 0 refills | Status: DC

## 2018-04-02 MED ORDER — SODIUM CHLORIDE 0.9 % IV SOLN
Freq: Once | INTRAVENOUS | Status: AC
  Administered 2018-04-02: 13:00:00 via INTRAVENOUS
  Filled 2018-04-02: qty 250

## 2018-04-02 MED ORDER — SODIUM CHLORIDE 0.9 % IV SOLN
510.0000 mg | Freq: Once | INTRAVENOUS | Status: AC
  Administered 2018-04-02: 510 mg via INTRAVENOUS
  Filled 2018-04-02: qty 17

## 2018-04-02 NOTE — Patient Instructions (Signed)

## 2018-04-02 NOTE — Telephone Encounter (Signed)
Dr. Jacqualyn Posey got a response from the Infectious Disease doctor.  Dr. Jacqualyn Posey wants to know what you are taking.  He said are you using Mupirocin Intranasal twice a day?  "Yes, I am."  Did they prescribe you any CHG wipes?  "No, I didn't get that.  Call them in and I'll go pick them up tomorrow.  Does that mean my surgery is still on for Wednesday?"  I am not sure.  I'll have to ask Dr. Jacqualyn Posey about that.  "Okay, let me know because I have to let my family know."  I understand.

## 2018-04-02 NOTE — Telephone Encounter (Signed)
Left detailed message for pt that the closest lab appt available on Monday is 8:45 or this is a 9:30am at this time. Left detailed message on pt's voicemail that I have placed her in the 8:45 time slot and to call back if she will need something later in the day.

## 2018-04-02 NOTE — Progress Notes (Signed)
CHG wipes sent to pharmacy

## 2018-04-02 NOTE — Telephone Encounter (Signed)
Can you see if this is what she is doing?   From Dr. Johnnye Sima with ID-  With her MRSA pcr positive, and her hx, I would give her mupirocin intransal bid for the week before surgery as well as giving her the CHG wipes.  Thanks  jeff

## 2018-04-02 NOTE — Telephone Encounter (Signed)
I sent the wipes to her pharmacy.  She should be set for surgery on Wednesday.

## 2018-04-05 ENCOUNTER — Other Ambulatory Visit (INDEPENDENT_AMBULATORY_CARE_PROVIDER_SITE_OTHER): Payer: 59

## 2018-04-05 LAB — BASIC METABOLIC PANEL
BUN: 19 mg/dL (ref 6–23)
CHLORIDE: 97 meq/L (ref 96–112)
CO2: 33 mEq/L — ABNORMAL HIGH (ref 19–32)
Calcium: 7.3 mg/dL — ABNORMAL LOW (ref 8.4–10.5)
Creatinine, Ser: 0.85 mg/dL (ref 0.40–1.20)
GFR: 72.29 mL/min (ref >60.00)
GLUCOSE: 96 mg/dL (ref 70–99)
POTASSIUM: 3.8 meq/L (ref 3.5–5.1)
Sodium: 140 mEq/L (ref 135–145)

## 2018-04-05 NOTE — Telephone Encounter (Signed)
Pt. Had BMP drawn this AM.

## 2018-04-06 ENCOUNTER — Other Ambulatory Visit: Payer: Self-pay | Admitting: Podiatry

## 2018-04-06 ENCOUNTER — Telehealth: Payer: Self-pay | Admitting: Family

## 2018-04-06 ENCOUNTER — Telehealth: Payer: Self-pay | Admitting: *Deleted

## 2018-04-06 MED ORDER — TRAMADOL HCL 50 MG PO TABS: 50.0000 mg | ORAL_TABLET | Freq: Two times a day (BID) | ORAL | 0 refills | Status: DC | PRN

## 2018-04-06 NOTE — Telephone Encounter (Signed)
"  I am calling in regards to [name].  She's scheduled to have surgery tomorrow with Dr. Jacqualyn Posey.  I got her lab results and her calcium is too low.  I suggest she not have surgery at this time."  Would you like to speak with Dr. Jacqualyn Posey?  "Yes, if he's available."  I transferred the call to Dr. Jacqualyn Posey.   Dr. Jacqualyn Posey advised me to call the patient and let her know that we must cancel her surgery.    I'm calling you in regards to your surgery that's scheduled for tomorrow.  Dr. Jacqualyn Posey received a call from your Nurse Practitioner, Debbrah Alar.  She informed us that she could not give clearance for your surgery at this time.  She said your Calcium was too low to proceed.  So we're cancelling your surgery for tomorrow.  "Okay I know.  Can he give me something for pain?"  I'll send you to the nurse.  You can leave her a message if she doesn't answer.  I called and canceled the surgery at [facility].

## 2018-04-06 NOTE — Telephone Encounter (Signed)
Pt called states her surgery was cancelled today, and has been in pain and would like a prescription for pain medication.

## 2018-04-06 NOTE — Telephone Encounter (Signed)
Called [facility] [phone]), Left detailed message on [facility] (scheduling) that PCP could not give clearance at this time due to the below reasons and to call us back if any questions.

## 2018-04-06 NOTE — Telephone Encounter (Signed)
I notified pt that I did not think it is safe for her to proceed with surgery with her calcium being so low.  Advised pt that I would notify Dr. Jacqualyn Posey about need to postpone surgery.  I also discussed case with Dr. Charlett Blake who agreed.  Pt was upset with my recommendations but I advised her that I really was concerned that it would not be safe to proceed with surgery with her low Ca++.  I advised her that I would like to get her back in with Dr. Buddy Duty to help with this and get it stabilized prior to rescheduling surgery.  I also spoke with Dr. Jacqualyn Posey and he is aware and in agreement with plan.

## 2018-04-06 NOTE — Telephone Encounter (Signed)
Could you please contact Dr. Leigh [name] office and let them know that patient's calcium is dangerously low and that I would like to have her follow up with her endocrinologist to optimize her calcium pre-operatively therefore I am not clearing her medically for her scheduled surgery tomorrow.

## 2018-04-06 NOTE — Telephone Encounter (Signed)
I informed pt of Dr. Leigh Aurora orders for tramadol.

## 2018-04-06 NOTE — Telephone Encounter (Signed)
See order(s).

## 2018-04-06 NOTE — Telephone Encounter (Signed)
Postop's have been canceled.

## 2018-04-06 NOTE — Telephone Encounter (Signed)
I have sent tramadol to pharmacy. Please let her know. Thanks.

## 2018-04-07 ENCOUNTER — Ambulatory Visit (HOSPITAL_COMMUNITY): Admission: RE | Admit: 2018-04-07 | Payer: 59 | Source: Ambulatory Visit | Admitting: Podiatry

## 2018-04-07 ENCOUNTER — Encounter (HOSPITAL_COMMUNITY): Admission: RE | Payer: Self-pay | Source: Ambulatory Visit

## 2018-04-07 SURGERY — FUSION, JOINT, GREAT TOE
Anesthesia: General | Laterality: Right

## 2018-04-07 NOTE — Telephone Encounter (Signed)
Opened in error

## 2018-04-12 ENCOUNTER — Encounter: Payer: 59 | Admitting: Podiatry

## 2018-04-13 ENCOUNTER — Telehealth: Payer: Self-pay | Admitting: *Deleted

## 2018-04-13 ENCOUNTER — Encounter: Payer: Self-pay | Admitting: Family

## 2018-04-13 NOTE — Telephone Encounter (Signed)
Spoke with [name] at Dr Cindra Eves office to get follow up soon for pt's low calcium level. Pt is currently scheduled for 07/01/17. Requested earlier appt do to needing surgical clearance.  Appointment has been given for 04/19/18 at 1:15pm. Notified pt and she voices understanding.

## 2018-04-13 NOTE — Telephone Encounter (Signed)
Copied from [place] [id number]. Topic: General - Other >> Apr 13, 2018  2:37 PM Vernona Rieger wrote: Reason for CRM: TJ with Dr Cindra Eves office called and wanted to let Larena Glassman know to fax over the progress notes to fax number [phone] and can not be more than 6 pages. She said she has her schedule for Monday and needs no later than this Friday.

## 2018-04-13 NOTE — Telephone Encounter (Signed)
Requested information faxed to below #.

## 2018-04-13 NOTE — Telephone Encounter (Signed)
Noted  

## 2018-04-15 MED ORDER — HYDROCHLOROTHIAZIDE 25 MG PO TABS: 25.0000 mg | ORAL_TABLET | Freq: Every day | ORAL | 3 refills | Status: DC

## 2018-04-15 MED ORDER — FUROSEMIDE 40 MG PO TABS: ORAL_TABLET | ORAL | 1 refills | Status: DC

## 2018-04-15 NOTE — Telephone Encounter (Signed)
Spoke with Dr. Buddy Duty and reviewed her clinical situation. He recommended addition of thiazide diuretic to help improve serum calcium and decrease urinary calcium excretion. Recommended subsequent decrease in lasix.  Pt reports taking lasix 80mg  BID.  Will change lasix to 40mg  AM and 80mg  PM and add hctz 25mg  once daily in the AM.  Will need to follow up on 12/4 as scheduled with me and keep upcoming apt with Dr. Buddy Duty. Pt verbalizes understanding of plan.

## 2018-04-15 NOTE — Addendum Note (Signed)
Addended by: Debbrah Alar on: 04/15/2018 11:01 AM   Modules accepted: Orders

## 2018-04-16 ENCOUNTER — Ambulatory Visit (INDEPENDENT_AMBULATORY_CARE_PROVIDER_SITE_OTHER): Payer: 59

## 2018-04-16 ENCOUNTER — Ambulatory Visit: Payer: 59

## 2018-04-16 DIAGNOSIS — E538 Deficiency of other specified B group vitamins: Secondary | ICD-10-CM | POA: Diagnosis not present

## 2018-04-16 DIAGNOSIS — H5203 Hypermetropia, bilateral: Secondary | ICD-10-CM | POA: Diagnosis not present

## 2018-04-16 DIAGNOSIS — H524 Presbyopia: Secondary | ICD-10-CM | POA: Diagnosis not present

## 2018-04-16 DIAGNOSIS — H52223 Regular astigmatism, bilateral: Secondary | ICD-10-CM | POA: Diagnosis not present

## 2018-04-16 MED ORDER — CYANOCOBALAMIN 1000 MCG/ML IJ SOLN
1000.0000 ug | Freq: Once | INTRAMUSCULAR | Status: AC
  Administered 2018-04-16: 1000 ug via INTRAMUSCULAR

## 2018-04-16 NOTE — Progress Notes (Signed)
Pre visit review using our clinic tool,if applicable. No additional management support is needed unless otherwise documented below in the visit note.   Pt here for monthly B12 injection per   B12 1023mcg given IM left deltoid. , pt tolerated injection well. No complaints voiced.  Next B12 injection scheduled for  1 month.

## 2018-04-19 ENCOUNTER — Encounter: Payer: 59 | Admitting: Podiatry

## 2018-04-19 DIAGNOSIS — Z85850 Personal history of malignant neoplasm of thyroid: Secondary | ICD-10-CM | POA: Diagnosis not present

## 2018-04-19 DIAGNOSIS — E039 Hypothyroidism, unspecified: Secondary | ICD-10-CM | POA: Diagnosis not present

## 2018-04-19 DIAGNOSIS — E892 Postprocedural hypoparathyroidism: Secondary | ICD-10-CM | POA: Diagnosis not present

## 2018-04-26 DIAGNOSIS — E039 Hypothyroidism, unspecified: Secondary | ICD-10-CM | POA: Diagnosis not present

## 2018-04-26 DIAGNOSIS — Z85850 Personal history of malignant neoplasm of thyroid: Secondary | ICD-10-CM | POA: Diagnosis not present

## 2018-04-26 DIAGNOSIS — E892 Postprocedural hypoparathyroidism: Secondary | ICD-10-CM | POA: Diagnosis not present

## 2018-04-26 DIAGNOSIS — I700 Atherosclerosis of aorta: Secondary | ICD-10-CM | POA: Diagnosis not present

## 2018-04-28 ENCOUNTER — Ambulatory Visit: Payer: 59 | Admitting: Family

## 2018-04-28 DIAGNOSIS — Z0289 Encounter for other administrative examinations: Secondary | ICD-10-CM

## 2018-05-05 ENCOUNTER — Ambulatory Visit: Payer: 59 | Admitting: Family

## 2018-05-05 ENCOUNTER — Other Ambulatory Visit: Payer: Self-pay | Admitting: Podiatry

## 2018-05-05 ENCOUNTER — Encounter: Payer: Self-pay | Admitting: Family

## 2018-05-05 ENCOUNTER — Other Ambulatory Visit: Payer: Self-pay | Admitting: Family

## 2018-05-05 VITALS — BP 143/71 | HR 88 | Temp 98.6°F | Resp 16 | Ht 64.0 in | Wt 309.0 lb

## 2018-05-05 DIAGNOSIS — E213 Hyperparathyroidism, unspecified: Secondary | ICD-10-CM | POA: Diagnosis not present

## 2018-05-05 DIAGNOSIS — I10 Essential (primary) hypertension: Secondary | ICD-10-CM

## 2018-05-05 DIAGNOSIS — J45909 Unspecified asthma, uncomplicated: Secondary | ICD-10-CM

## 2018-05-05 DIAGNOSIS — E538 Deficiency of other specified B group vitamins: Secondary | ICD-10-CM

## 2018-05-05 DIAGNOSIS — R350 Frequency of micturition: Secondary | ICD-10-CM | POA: Diagnosis not present

## 2018-05-05 LAB — BASIC METABOLIC PANEL
BUN: 27 mg/dL — AB (ref 6–23)
CO2: 38 mEq/L — ABNORMAL HIGH (ref 19–32)
CREATININE: 0.85 mg/dL (ref 0.40–1.20)
Calcium: 8.6 mg/dL (ref 8.4–10.5)
Chloride: 89 mEq/L — ABNORMAL LOW (ref 96–112)
GFR: 72.27 mL/min (ref >60.00)
GLUCOSE: 94 mg/dL (ref 70–99)
POTASSIUM: 3.5 meq/L (ref 3.5–5.1)
SODIUM: 138 meq/L (ref 135–145)

## 2018-05-05 MED ORDER — NYSTATIN 100000 UNIT/GM EX CREA: 1.0000 "application " | TOPICAL_CREAM | Freq: Two times a day (BID) | CUTANEOUS | 2 refills | Status: DC

## 2018-05-05 MED ORDER — DICLOFENAC SODIUM 1 % TD GEL: 2.0000 g | Freq: Four times a day (QID) | TRANSDERMAL | 3 refills | Status: DC

## 2018-05-05 MED ORDER — MONTELUKAST SODIUM 10 MG PO TABS: 10.0000 mg | ORAL_TABLET | Freq: Every day | ORAL | 3 refills | Status: DC

## 2018-05-05 NOTE — Progress Notes (Signed)
Subjective:    Patient ID: [name], female    DOB: 15-Jul-1955, 62 y.o.   MRN: [id number]  HPI  Patient is a 62 yr old female who presents today for follow up.  HTN-she is maintained on hydrochlorothiazide. BP Readings from Last 3 Encounters:  05/05/18 (!) 143/71  03/31/18 117/65  03/29/18 132/70   Hypocalcemia/hyperparathyroidism-  hctz was added to her regimen.  She was recently seen by her endocrinologist.  Asthma- on symbicort/prn .  She reports that her asthma has been well controlled.  Has a lot of nasal drainage in the AM, postnasal.  Continues antihistamine and Flonase.  b12 deficiency-she continues monthly B12 injections.  She is requesting a refill on nystatin cream for fungal infection in the skin folds of her abdomen and groin. Review of Systems See HPI  Past Medical History:  Diagnosis Date  . Abnormal Pap smear    years ago/no biopsy  . Allergic rhinitis   . Anemia, iron deficiency    iron infusion scheduled for 04/02/2018  . Aortic atherosclerosis ([place])   . Arthritis    back- lower  . Asthma   . Atrial fibrillation ([place]) August 2012   OFF XARELTO LAST MONTH DUE TO BLEEDING IN STOOL  . Bursitis of hip left  . Chronic diastolic (congestive) heart failure ([facility])    pt unaware of this  . COPD (chronic obstructive pulmonary disease) with chronic bronchitis ([place])   . Decreased pulses in feet   . Dysrhythmia    afib, followed by Dr. Stanford Breed   . Edema of both legs   . Esophagitis 02/02/2014   Distal, linear erosions, noted on endoscopy  . Fatty liver 01/07/2012  . Fibroid 1974   fibroid cyst on left fallopian tube  . Fibromyalgia   . Foot ulcer ([place])    AREA HEALED RIGHT FOOT  . GERD (gastroesophageal reflux disease)   . Headache(784.0)    occasional sinus headache   . Hepatomegaly   . History of blood transfusion JULY 2015  . History of cardiomegaly 12/06/2010   Noted on CT  . History of E. coli septicemia   . Hypertension   .  Hypoparathyroidism ([place]) 01/07/2011  . Hypothyroidism   . Kidney stone 10/19/2015   passed on their own  . Morbid obesity ([place])   . MRSA infection   . Nephrolithiasis 2/16, 9/16   SEES DR Risa Grill  . Pernicious anemia 02/20/2014   followed by Debbrah Alar  . Pneumonia   . PONV (postoperative nausea and vomiting)   . Rash    thighs and back  . Seizures ([place])    infancy secondary to fever  . Staph aureus infection   . Thoracic spondylosis 12/06/2010   Noted on CT  . Thyroid cancer ([place]) 2011   THYROIDECTOMY DONE  . Uterine cancer ([place]) 2014   Mirena IUD  . Vitamin B 12 deficiency   . Vitamin D deficiency      Social History   Socioeconomic History  . Marital status: Single    Spouse name: Not on file  . Number of children: 0  . Years of education: Not on file  . Highest education level: Not on file  Occupational History  . Occupation: works in Insurance claims handler  . Occupation: DESIGN COMPUTER CHIP    Employer: [employer]  Social Needs  . Financial resource strain: Not on file  . Food insecurity:    Worry: Not on file    Inability: Not on file  .  Transportation needs:    Medical: Not on file    Non-medical: Not on file  Tobacco Use  . Smoking status: Former Smoker    Packs/day: 0.50    Years: 25.00    Pack years: 12.50    Types: Cigarettes    Start date: 12/24/1970    Last attempt to quit: 05/27/1995    Years since quitting: 22.9  . Smokeless tobacco: Never Used  . Tobacco comment: quit smoking 19 years ago  Substance and Sexual Activity  . Alcohol use: Yes    Alcohol/week: 0.0 standard drinks    Comment: 1/2 glass per month  . Drug use: No  . Sexual activity: Yes    Partners: Male    Birth control/protection: Post-menopausal  Lifestyle  . Physical activity:    Days per week: Not on file    Minutes per session: Not on file  . Stress: Not on file  Relationships  . Social connections:    Talks on phone: Not on file    Gets together: Not on file     Attends religious service: Not on file    Active member of club or organization: Not on file    Attends meetings of clubs or organizations: Not on file    Relationship status: Not on file  . Intimate partner violence:    Fear of current or ex partner: Not on file    Emotionally abused: Not on file    Physically abused: Not on file    Forced sexual activity: Not on file  Other Topics Concern  . Not on file  Social History Narrative   Occupation: works in Insurance claims handler - Field seismologist   Single       Former Smoker - quit tobacco 12 years ago.  She was light smoker for 10 years.                 Past Surgical History:  Procedure Laterality Date  . AMPUTATION Right 05/18/2014   Procedure: RIGHT FIFTH RAY AMPUTATION FOOT;  Surgeon: Wylene Simmer, MD;  Location: [facility];  Service: Orthopedics;  Laterality: Right;  . APPENDECTOMY    . BUNIONECTOMY     bilateral  . COLONOSCOPY WITH PROPOFOL N/A 02/02/2014   Procedure: COLONOSCOPY WITH PROPOFOL;  Surgeon: Irene Shipper, MD;  [facility]: [facility];  Service: Endoscopy;  Laterality: N/A;  . cyst on ovary removed     . CYSTOSCOPY W/ RETROGRADES  03/03/2012   Procedure: CYSTOSCOPY WITH RETROGRADE PYELOGRAM;  Surgeon: Bernestine Amass, MD;  [facility]: [facility];  Service: Urology;  Laterality: Bilateral;  . CYSTOSCOPY W/ URETERAL STENT PLACEMENT Right 11/25/2017   Procedure: CYSTOSCOPY WITH RETROGRADE RIGHT URETERAL STENT PLACEMENT;  Surgeon: Franchot Gallo, MD;  Location: [facility];  Service: Urology;  Laterality: Right;  . CYSTOSCOPY W/ URETERAL STENT PLACEMENT Right 01/15/2018   Procedure: RIGHT STENT EXCHANGE;  Surgeon: Ardis Hughs, MD;  [facility];  Service: Urology;  Laterality: Right;  . CYSTOSCOPY WITH RETROGRADE PYELOGRAM, URETEROSCOPY AND STENT PLACEMENT Left 08/25/2012   Procedure: CYSTOSCOPY WITH RETROGRADE PYELOGRAM, URETEROSCOPY;  Surgeon: Bernestine Amass, MD;  [facility];  Service: Urology;  Laterality: Left;  .  CYSTOSCOPY WITH STENT PLACEMENT Left 01/15/2018   Procedure: LEFT STENT PLACEMENT;  Surgeon: Ardis Hughs, MD;  [facility];  Service: Urology;  Laterality: Left;  . CYSTOSCOPY WITH STENT PLACEMENT Right 01/22/2018   Procedure: RIGHT STENT REMOVAL;  Surgeon: Ardis Hughs, MD;  [facility];  Service: Urology;  Laterality: Right;  . CYSTOSCOPY/URETEROSCOPY/HOLMIUM LASER/STENT PLACEMENT Right 12/23/2017   Procedure: RIGHT URETEROSCOPY STONE REMOVAL, HOLMIUM LASER, RIGHT /STENT EXCHANGE;  Surgeon: Ardis Hughs, MD;  [facility];  Service: Urology;  Laterality: Right;  . CYSTOSCOPY/URETEROSCOPY/HOLMIUM LASER/STENT PLACEMENT Left 01/22/2018   Procedure: LEFT URETEROSCOPY STONE REMOVAL HOLMIUM LASER LEFT STENT Freddi Starr;  Surgeon: Ardis Hughs, MD;  [facility];  Service: Urology;  Laterality: Left;  . DILATION AND CURETTAGE OF UTERUS N/A 02/21/2013   Procedure: DILATATION AND CURETTAGE;  Surgeon: Lyman Speller, MD;  Location: [facility];  Service: Gynecology;  Laterality: N/A;  . DILATION AND CURETTAGE OF UTERUS N/A 04/09/2015   Procedure: [place] IUD removal;  Surgeon: Megan Salon, MD;  Location: [facility];  Service: Gynecology;  Laterality: N/A;  Patient weight 307lbs  . ESOPHAGOGASTRODUODENOSCOPY (EGD) WITH PROPOFOL N/A 02/02/2014   Procedure: ESOPHAGOGASTRODUODENOSCOPY (EGD) WITH PROPOFOL;  Surgeon: Irene Shipper, MD;  [facility];  Service: Endoscopy;  Laterality: N/A;  . GASTRIC BYPASS  1974  . HOLMIUM LASER APPLICATION Left 4/0/9811   Procedure: HOLMIUM LASER APPLICATION;  Surgeon: Bernestine Amass, MD;  [facility];  Service: Urology;  Laterality: Left;  . LITHOTRIPSY  03/2012  . LITHOTRIPSY  2/16  . THYROIDECTOMY  05/15/2010  . TONSILLECTOMY AND ADENOIDECTOMY    . URETEROSCOPY  03/03/2012   Procedure: URETEROSCOPY;  Surgeon: Bernestine Amass, MD;  [facility];  Service: Urology;  Laterality:  Left;  . URETEROSCOPY WITH HOLMIUM LASER LITHOTRIPSY Bilateral 01/15/2018   Procedure: CYSTOSCOPY/BILATERAL URETEROSCOPY WITH HOLMIUM LASER LITHOTRIPSY STONE REMOVAL;  Surgeon: Ardis Hughs, MD;  [facility];  Service: Urology;  Laterality: Bilateral;    Family History  Problem Relation Age of Onset  . Hypertension Father   . Diabetes Father   . Lung cancer Father   . Heart attack Father        MI at age 27  . Hypertension Mother   . Hyperthyroidism Mother   . Heart disease Mother   . Heart attack Mother        MI at age 67  . Asthma Brother   . Hypertension Brother        younger  . Heart disease Brother        older  . Colon cancer Neg Hx   . Esophageal cancer Neg Hx   . Stomach cancer Neg Hx   . Kidney disease Neg Hx   . Liver disease Neg Hx     Allergies  Allergen Reactions  . Other Anaphylaxis    NUTS  . Penicillins Other (See Comments)    Headache. And hives  Has patient had a PCN reaction causing immediate rash, facial/tongue/throat swelling, SOB or lightheadedness with hypotension: No Has patient had a PCN reaction causing severe rash involving mucus membranes or skin necrosis: No Has patient had a PCN reaction that required hospitalization: No Has patient had a PCN reaction occurring within the last 10 years: Yes If all of the above answers are "NO", then may proceed with Cephalosporin use.   [name] Amoxicillin-Pot Clavulanate Other (See Comments)    headache  . Ciprofloxacin Nausea Only and Rash  . Food Hives and Other (See Comments)    Potato  . Tomato Hives    Current Outpatient Medications on File Prior to Visit  Medication Sig Dispense Refill  . acetaminophen (TYLENOL) 325 MG tablet Take 2 tablets (650 mg total) by  mouth every 6 (six) hours as needed for mild pain or moderate pain. 60 tablet 0  . albuterol (PROVENTIL HFA;VENTOLIN HFA) 108 (90 Base) MCG/ACT inhaler Inhale 2 puffs into the lungs every 6 (six) hours as needed for wheezing or  shortness of breath. 1 Inhaler 5  . albuterol (PROVENTIL) (2.5 MG/3ML) 0.083% nebulizer solution Take 3 mLs (2.5 mg total) by nebulization every 6 (six) hours as needed for wheezing or shortness of breath. 150 mL 1  . apixaban (ELIQUIS) 5 MG TABS tablet Take 5 mg by mouth 2 (two) times daily.    . calcitRIOL (ROCALTROL) 0.5 MCG capsule Take 2 mcg by mouth daily.     . calcium elemental as carbonate (TUMS ULTRA 1000) 400 MG chewable tablet Chew 2,000 mg by mouth 2 (two) times daily.    . Chlorhexidine Gluconate Cloth 2 % PADS Apply 6 each topically daily at 6 (six) AM. Use the night before and the morning of surgery to wash your feet and legs 2 each 0  . citric acid-potassium citrate (POLYCITRA) 1100-334 MG/5ML solution Take 1 mL by mouth daily.     . cyanocobalamin (,VITAMIN B-12,) 1000 MCG/ML injection Inject 1,000 mcg into the muscle every 30 (thirty) days.    [name] desoximetasone (TOPICORT) 0.05 % cream Apply topically 2 (two) times daily as needed. (Patient taking differently: Apply 1 application topically 2 (two) times daily as needed (for rash). ) 60 g 0  . diltiazem (CARDIZEM CD) 240 MG 24 hr capsule TAKE 1 CAPSULE BY MOUTH  DAILY (Patient taking differently: Take 240 mg by mouth daily. ) 90 capsule 1  . diphenhydrAMINE (BENADRYL) 25 mg capsule Take 25 mg by mouth every 6 (six) hours as needed for itching.     . diphenhydrAMINE-PE-APAP (RA SEVERE ALLERGY PLUS SINUS PO) Take 2 tablets by mouth daily as needed (for severe allergy).    [name] EPINEPHrine (EPIPEN 2-PAK) 0.3 mg/0.3 mL IJ SOAJ injection Inject 0.3 mg into the muscle once.    . fluocinonide (LIDEX) 0.05 % external solution Apply 1 application topically 2 (two) times daily as needed (for rash).    . fluticasone (VERAMYST) 27.5 MCG/SPRAY nasal spray Place 2 sprays into the nose daily.    . furosemide (LASIX) 40 MG tablet TAKE 1 tablet by mouth in the morning and 2 tabs in the evening 270 tablet 1  . hydrochlorothiazide (HYDRODIURIL) 25 MG  tablet Take 1 tablet (25 mg total) by mouth daily. In the AM 30 tablet 3  . levocetirizine (XYZAL) 5 MG tablet TAKE 1 TABLET BY MOUTH  EVERY EVENING (Patient taking differently: Take 5 mg by mouth every evening. ) 90 tablet 3  . levothyroxine (SYNTHROID, LEVOTHROID) 100 MCG tablet Take 350 mcg by mouth daily before breakfast.     . loratadine (CLARITIN) 10 MG tablet Take 10 mg by mouth every morning.    . Melatonin 5 MG TABS Take 5 mg by mouth at bedtime as needed (for sleep).     . nystatin (MYCOSTATIN/NYSTOP) powder Apply topically 3 (three) times daily. 15 g 0  . omeprazole (PRILOSEC) 40 MG capsule TAKE 1 CAPSULE BY MOUTH  DAILY (Patient taking differently: Take 40 mg by mouth daily. ) 90 capsule 1  . ondansetron (ZOFRAN) 4 MG tablet Take 1 tablet (4 mg total) by mouth every 8 (eight) hours as needed for nausea or vomiting. 30 tablet 0  . saccharomyces boulardii (FLORASTOR) 250 MG capsule Take 1 capsule (250 mg total) by mouth 2 (two) times  daily. (Patient taking differently: Take 250 mg by mouth daily. ) 30 capsule 0  . SYMBICORT 160-4.5 MCG/ACT inhaler INHALE 2 PUFFS TWO TIMES  DAILY (Patient taking differently: Inhale 2 puffs into the lungs 2 (two) times daily. ) 30.6 g 1  . traMADol (ULTRAM) 50 MG tablet Take 1 tablet (50 mg total) by mouth every 12 (twelve) hours as needed. 12 tablet 0  . valACYclovir (VALTREX) 1000 MG tablet 2 tabs by mouth now and again in 12 hours 4 tablet 0  . Vitamin D, Ergocalciferol, (DRISDOL) 50000 units CAPS capsule TAKE 1 CAPSULE BY MOUTH 3  TIMES WEEKLY (Patient taking differently: Take 50,000 Units by mouth every Monday, Wednesday, and Friday. ) 39 capsule 1  . zafirlukast (ACCOLATE) 20 MG tablet TAKE 1 TABLET BY MOUTH TWO  TIMES DAILY BEFORE MEALS (Patient taking differently: Take 20 mg by mouth 2 (two) times daily before a meal. ) 180 tablet 1  . nystatin (MYCOSTATIN) 100000 UNIT/ML suspension Take 5 mLs (500,000 Units total) by mouth 4 (four) times daily.  (Patient not taking: Reported on 05/05/2018) 200 mL 0   No current facility-administered medications on file prior to visit.     BP (!) 143/71 (BP Location: Right Arm, Patient Position: Sitting, Cuff Size: Large)   Pulse 88   Temp 98.6 F (37 C) (Oral)   Resp 16   Ht 5\' 4"  (1.626 m)   Wt (!) 309 lb (140.2 kg)   LMP 06/11/2012   SpO2 96%   BMI 53.04 kg/m       Objective:   Physical Exam  Constitutional: She is oriented to person, place, and time. She appears well-developed and well-nourished.  Neck: Neck supple. No thyromegaly present.  Cardiovascular: Normal rate, regular rhythm and normal heart sounds.  No murmur heard. Pulmonary/Chest: Effort normal and breath sounds normal. No respiratory distress. She has no wheezes.  Musculoskeletal:  2+ bilateral lower extremity edema  Neurological: She is alert and oriented to person, place, and time.  Skin: Skin is warm and dry.  Psychiatric: She has a normal mood and affect. Her behavior is normal. Judgment and thought content normal.          Assessment & Plan:  Hypertension-blood pressure is stable.  Continue hydrochlorothiazide.  Hyperparathyroidism/hypercalcemia-will obtain follow-up calcium level.  She is anxious to hopefully get back on the schedule for her foot surgery once her calcium is stabilized.  Intertrigo-refill provided for nystatin.  Asthma- symptoms are stable.  She does note ongoing issues with allergic rhinitis and postnasal drip.  Will give trial of Singulair.  B12 deficiency-patient continues monthly B12 injections.

## 2018-05-05 NOTE — Patient Instructions (Signed)
Please complete lab work prior to leaving. Add singulair once daily for nasal congestion.

## 2018-05-07 ENCOUNTER — Telehealth: Payer: Self-pay | Admitting: Family

## 2018-05-07 ENCOUNTER — Encounter: Payer: Self-pay | Admitting: Family

## 2018-05-07 LAB — URINE CULTURE
MICRO NUMBER:: 91483598
SPECIMEN QUALITY:: ADEQUATE

## 2018-05-07 MED ORDER — NITROFURANTOIN MONOHYD MACRO 100 MG PO CAPS: 100.0000 mg | ORAL_CAPSULE | Freq: Two times a day (BID) | ORAL | 0 refills | Status: DC

## 2018-05-07 NOTE — Telephone Encounter (Signed)
Please let pt know that her urine culture shows UTI. I would like her to start macrodantin bid x 7 days.

## 2018-05-07 NOTE — Telephone Encounter (Signed)
Advised patient of results and to pick up her antibiotics at her local pharmacy listed.

## 2018-05-13 DIAGNOSIS — E876 Hypokalemia: Secondary | ICD-10-CM | POA: Diagnosis not present

## 2018-05-18 ENCOUNTER — Ambulatory Visit (INDEPENDENT_AMBULATORY_CARE_PROVIDER_SITE_OTHER): Payer: 59

## 2018-05-18 DIAGNOSIS — E538 Deficiency of other specified B group vitamins: Secondary | ICD-10-CM

## 2018-05-18 MED ORDER — CYANOCOBALAMIN 1000 MCG/ML IJ SOLN
1000.0000 ug | Freq: Once | INTRAMUSCULAR | Status: AC
  Administered 2018-05-18: 1000 ug via INTRAMUSCULAR

## 2018-05-18 NOTE — Progress Notes (Signed)
Pre visit review using our clinic tool,if applicable. No additional management support is needed unless otherwise documented below in the visit note.   Pt here for monthly B12 injection per order from M. Inda Castle NP.  B12 1052mcg given IM right deltoid, and pt tolerated injection well.  Next B12 injection scheduled for 1 month. Patient aware.

## 2018-05-24 DIAGNOSIS — Z85850 Personal history of malignant neoplasm of thyroid: Secondary | ICD-10-CM | POA: Diagnosis not present

## 2018-05-24 DIAGNOSIS — E892 Postprocedural hypoparathyroidism: Secondary | ICD-10-CM | POA: Diagnosis not present

## 2018-05-24 DIAGNOSIS — E876 Hypokalemia: Secondary | ICD-10-CM | POA: Diagnosis not present

## 2018-05-24 DIAGNOSIS — E039 Hypothyroidism, unspecified: Secondary | ICD-10-CM | POA: Diagnosis not present

## 2018-05-29 ENCOUNTER — Other Ambulatory Visit: Payer: Self-pay | Admitting: Podiatry

## 2018-05-29 ENCOUNTER — Other Ambulatory Visit: Payer: Self-pay | Admitting: Family

## 2018-05-31 NOTE — Telephone Encounter (Signed)
Please see refill protocol failure and advise?

## 2018-06-02 ENCOUNTER — Telehealth: Payer: Self-pay | Admitting: Podiatry

## 2018-06-02 NOTE — Telephone Encounter (Signed)
Pt called requesting refill of tramadol. Stated that the pharmacy was supposed to have called the office to request the refill.

## 2018-06-03 ENCOUNTER — Other Ambulatory Visit: Payer: Self-pay | Admitting: Podiatry

## 2018-06-03 MED ORDER — TRAMADOL HCL 50 MG PO TABS: 50.0000 mg | ORAL_TABLET | Freq: Two times a day (BID) | ORAL | 0 refills | Status: DC | PRN

## 2018-06-03 NOTE — Telephone Encounter (Signed)
I refilled it and sent it to the pharmacy. I never received a request.

## 2018-06-16 ENCOUNTER — Other Ambulatory Visit: Payer: Self-pay | Admitting: Podiatry

## 2018-06-16 DIAGNOSIS — E892 Postprocedural hypoparathyroidism: Secondary | ICD-10-CM | POA: Diagnosis not present

## 2018-06-17 ENCOUNTER — Other Ambulatory Visit: Payer: Self-pay | Admitting: Family

## 2018-06-18 ENCOUNTER — Ambulatory Visit (INDEPENDENT_AMBULATORY_CARE_PROVIDER_SITE_OTHER): Payer: 59

## 2018-06-18 DIAGNOSIS — E538 Deficiency of other specified B group vitamins: Secondary | ICD-10-CM | POA: Diagnosis not present

## 2018-06-18 MED ORDER — CYANOCOBALAMIN 1000 MCG/ML IJ SOLN
1000.0000 ug | Freq: Once | INTRAMUSCULAR | Status: AC
  Administered 2018-06-18: 1000 ug via INTRAMUSCULAR

## 2018-06-18 NOTE — Addendum Note (Signed)
Addended by: Jiles Prows on: 06/18/2018 10:32 AM   Modules accepted: Orders

## 2018-06-18 NOTE — Progress Notes (Addendum)
Pt here for monthly B12 injection per   B12 1065mcg given IM, and pt tolerated injection well.  Next B12 injection scheduled for 07-23-2018.  Agree with b12 injection for low b12 dx.  Mackie Pai, PA-C

## 2018-06-21 ENCOUNTER — Telehealth: Payer: Self-pay | Admitting: Podiatry

## 2018-06-21 NOTE — Telephone Encounter (Signed)
I informed pt. Dr. Margarette Asal had not seen her since 02/2018, that if she was still needing a pain medication she needed to make an appt. Pt agreed and I transferred to schedulers.

## 2018-06-21 NOTE — Telephone Encounter (Signed)
Looking to get tramadol refilled.

## 2018-06-24 ENCOUNTER — Telehealth: Payer: Self-pay | Admitting: *Deleted

## 2018-06-24 NOTE — Telephone Encounter (Signed)
I'm calling to reschedule your surgery.  Dr. Jacqualyn Posey got your results and said it was okay to reschedule it.  "Did he get the most recent ones?  I had one done on January 21.  I've had some medication changes too."  No, the last results I see were done in December.  "I've had several since then."  Have your levels been okay?  "As far as I know, they have."  Can you call your doctor and get them to send [place] those results?  "Yes, I can.  I will call them today.  I will have to wait until the end of February to schedule my surgery."  That is fine, Dr. Jacqualyn Posey doesn't have anything available the last week of February but he can do it on March 4.  You will need another history and physical form completed by your primary care doctor between February 4 and March 4.  "That date will be fine.  Can you send that paperwork to Dr. Conley Canal?  My foot has been killing me can you ask Dr. Jacqualyn Posey if I can get some Tramadol or pain medication."  I will ask him and call you with his response.  I sent a message to Levonne Spiller, representative for [facility], to inform him the patient has been rescheduled for July 28, 2018.

## 2018-06-24 NOTE — Telephone Encounter (Signed)
-----   Message from Trula Slade, DPM sent at 05/06/2018  3:10 PM EST ----- Can you call her to get her scheduled again. It will have to be mid January at this point most likely.  ----- Message ----- From: Debbrah Alar, NP Sent: 05/05/2018   1:58 PM EST To: Trula Slade, DPM  Hello,  So we have finally gotten her calcium in the normal range. I think she is stable medically to proceed with foot surgery.  Thanks,  Debbrah Alar NP

## 2018-06-30 ENCOUNTER — Encounter: Payer: Self-pay | Admitting: Family

## 2018-06-30 ENCOUNTER — Ambulatory Visit (HOSPITAL_BASED_OUTPATIENT_CLINIC_OR_DEPARTMENT_OTHER)
Admission: RE | Admit: 2018-06-30 | Discharge: 2018-06-30 | Disposition: A | Discharge status: Home / Self Care | Payer: 59 | Source: Ambulatory Visit | Attending: Family | Admitting: Family

## 2018-06-30 ENCOUNTER — Ambulatory Visit: Payer: 59 | Admitting: Family

## 2018-06-30 VITALS — BP 114/54 | HR 85 | Temp 98.7°F | Resp 16 | Ht 64.0 in | Wt 303.0 lb

## 2018-06-30 DIAGNOSIS — J209 Acute bronchitis, unspecified: Secondary | ICD-10-CM

## 2018-06-30 DIAGNOSIS — R21 Rash and other nonspecific skin eruption: Secondary | ICD-10-CM | POA: Diagnosis not present

## 2018-06-30 DIAGNOSIS — M1711 Unilateral primary osteoarthritis, right knee: Secondary | ICD-10-CM | POA: Diagnosis not present

## 2018-06-30 DIAGNOSIS — R062 Wheezing: Secondary | ICD-10-CM | POA: Diagnosis not present

## 2018-06-30 DIAGNOSIS — M25561 Pain in right knee: Secondary | ICD-10-CM

## 2018-06-30 DIAGNOSIS — R05 Cough: Secondary | ICD-10-CM | POA: Diagnosis not present

## 2018-06-30 MED ORDER — PREDNISONE 10 MG PO TABS: ORAL_TABLET | ORAL | 0 refills | Status: DC

## 2018-06-30 MED ORDER — AZITHROMYCIN 250 MG PO TABS: ORAL_TABLET | ORAL | 0 refills | Status: DC

## 2018-06-30 NOTE — Patient Instructions (Signed)
Please go to the first floor to complete knee x-ray and chest x-ray. Start zpak. Start prednisone taper. Continue albuterol (inhaler or neb) every 6 hours until you are feeling much better. OK to continue voltaren gel to knee. Call if symptoms worsen or if 3-4 days symptoms are not much better.

## 2018-06-30 NOTE — Progress Notes (Signed)
Subjective:    Patient ID: [name], female    DOB: April 11, 1956, 63 y.o.   MRN: [id number]  HPI  Patient is a 63 yr old female who presents today with several concerns:  1) Cough- reports + cough and associated chest congestion. Started Friday night.  She denies known fever.  Denies body aches.    Reports that she fell a week ago Saturday.  She was trying to get up.   2) Knee pain- complains of right knee pain. She is using voltaren gel which is helping.  Fell on her right knee with fall.    3) Skin rash- was prescribed Lidex by allergist. Rash started on Saturday. Reports lidex is starting ot help.    Review of Systems See HPI  Past Medical History:  Diagnosis Date  . Abnormal Pap smear    years ago/no biopsy  . Allergic rhinitis   . Anemia, iron deficiency    iron infusion scheduled for 04/02/2018  . Aortic atherosclerosis ([place])   . Arthritis    back- lower  . Asthma   . Atrial fibrillation ([place]) August 2012   OFF XARELTO LAST MONTH DUE TO BLEEDING IN STOOL  . Bursitis of hip left  . Chronic diastolic (congestive) heart failure (H[facility])    pt unaware of this  . COPD (chronic obstructive pulmonary disease) with chronic bronchitis ([place])   . Decreased pulses in feet   . Dysrhythmia    afib, followed by Dr. Stanford Breed   . Edema of both legs   . Esophagitis 02/02/2014   Distal, linear erosions, noted on endoscopy  . Fatty liver 01/07/2012  . Fibroid 1974   fibroid cyst on left fallopian tube  . Fibromyalgia   . Foot ulcer ([place])    AREA HEALED RIGHT FOOT  . GERD (gastroesophageal reflux disease)   . Headache(784.0)    occasional sinus headache   . Hepatomegaly   . History of blood transfusion JULY 2015  . History of cardiomegaly 12/06/2010   Noted on CT  . History of E. coli septicemia   . Hypertension   . Hypoparathyroidism ([place]) 01/07/2011  . Hypothyroidism   . Kidney stone 2015/10/12   passed on their own  . Morbid obesity ([place])   . MRSA infection   .  Nephrolithiasis 2/16, 9/16   SEES DR Risa Grill  . Pernicious anemia 02/20/2014   followed by Debbrah Alar  . Pneumonia   . PONV (postoperative nausea and vomiting)   . Rash    thighs and back  . Seizures ([place])    infancy secondary to fever  . Staph aureus infection   . Thoracic spondylosis 12/06/2010   Noted on CT  . Thyroid cancer ([place]) 2011   THYROIDECTOMY DONE  . Uterine cancer ([place]) 2014   Mirena IUD  . Vitamin B 12 deficiency   . Vitamin D deficiency      Social History   Socioeconomic History  . Marital status: Single    Spouse name: Not on file  . Number of children: 0  . Years of education: Not on file  . Highest education level: Not on file  Occupational History  . Occupation: works in Insurance claims handler  . Occupation: DESIGN COMPUTER CHIP    Employer: [employer]  Social Needs  . Financial resource strain: Not on file  . Food insecurity:    Worry: Not on file    Inability: Not on file  . Transportation needs:    Medical: Not  on file    Non-medical: Not on file  Tobacco Use  . Smoking status: Former Smoker    Packs/day: 0.50    Years: 25.00    Pack years: 12.50    Types: Cigarettes    Start date: 12/24/1970    Last attempt to quit: 05/27/1995    Years since quitting: 23.1  . Smokeless tobacco: Never Used  . Tobacco comment: quit smoking 19 years ago  Substance and Sexual Activity  . Alcohol use: Yes    Alcohol/week: 0.0 standard drinks    Comment: 1/2 glass per month  . Drug use: No  . Sexual activity: Yes    Partners: Male    Birth control/protection: Post-menopausal  Lifestyle  . Physical activity:    Days per week: Not on file    Minutes per session: Not on file  . Stress: Not on file  Relationships  . Social connections:    Talks on phone: Not on file    Gets together: Not on file    Attends religious service: Not on file    Active member of club or organization: Not on file    Attends meetings of clubs or organizations: Not on file     Relationship status: Not on file  . Intimate partner violence:    Fear of current or ex partner: Not on file    Emotionally abused: Not on file    Physically abused: Not on file    Forced sexual activity: Not on file  Other Topics Concern  . Not on file  Social History Narrative   Occupation: works in Insurance claims handler - Field seismologist   Single       Former Smoker - quit tobacco 12 years ago.  She was light smoker for 10 years.                 Past Surgical History:  Procedure Laterality Date  . AMPUTATION Right 05/18/2014   Procedure: RIGHT FIFTH RAY AMPUTATION FOOT;  Surgeon: Wylene Simmer, MD;  Location: [facility];  Service: Orthopedics;  Laterality: Right;  . APPENDECTOMY    . BUNIONECTOMY     bilateral  . COLONOSCOPY WITH PROPOFOL N/A 02/02/2014   Procedure: COLONOSCOPY WITH PROPOFOL;  Surgeon: Irene Shipper, MD;  [facility]: [facility];  Service: Endoscopy;  Laterality: N/A;  . cyst on ovary removed     . CYSTOSCOPY W/ RETROGRADES  03/03/2012   Procedure: CYSTOSCOPY WITH RETROGRADE PYELOGRAM;  Surgeon: Bernestine Amass, MD;  [facility]: [facility];  Service: Urology;  Laterality: Bilateral;  . CYSTOSCOPY W/ URETERAL STENT PLACEMENT Right 11/25/2017   Procedure: CYSTOSCOPY WITH RETROGRADE RIGHT URETERAL STENT PLACEMENT;  Surgeon: Franchot Gallo, MD;  Location: [facility];  Service: Urology;  Laterality: Right;  . CYSTOSCOPY W/ URETERAL STENT PLACEMENT Right 01/15/2018   Procedure: RIGHT STENT EXCHANGE;  Surgeon: Ardis Hughs, MD;  [facility];  Service: Urology;  Laterality: Right;  . CYSTOSCOPY WITH RETROGRADE PYELOGRAM, URETEROSCOPY AND STENT PLACEMENT Left 08/25/2012   Procedure: CYSTOSCOPY WITH RETROGRADE PYELOGRAM, URETEROSCOPY;  Surgeon: Bernestine Amass, MD;  [facility];  Service: Urology;  Laterality: Left;  . CYSTOSCOPY WITH STENT PLACEMENT Left 01/15/2018   Procedure: LEFT STENT PLACEMENT;  Surgeon: Ardis Hughs, MD;  [facility];  Service: Urology;   Laterality: Left;  . CYSTOSCOPY WITH STENT PLACEMENT Right 01/22/2018   Procedure: RIGHT STENT REMOVAL;  Surgeon: Ardis Hughs, MD;  [facility];  Service: Urology;  Laterality:  Right;  [name] CYSTOSCOPY/URETEROSCOPY/HOLMIUM LASER/STENT PLACEMENT Right 12/23/2017   Procedure: RIGHT URETEROSCOPY STONE REMOVAL, HOLMIUM LASER, RIGHT /STENT EXCHANGE;  Surgeon: Ardis Hughs, MD;  [facility];  Service: Urology;  Laterality: Right;  . CYSTOSCOPY/URETEROSCOPY/HOLMIUM LASER/STENT PLACEMENT Left 01/22/2018   Procedure: LEFT URETEROSCOPY STONE REMOVAL HOLMIUM LASER LEFT STENT Freddi Starr;  Surgeon: Ardis Hughs, MD;  [facility];  Service: Urology;  Laterality: Left;  . DILATION AND CURETTAGE OF UTERUS N/A 02/21/2013   Procedure: DILATATION AND CURETTAGE;  Surgeon: Lyman Speller, MD;  Location: [facility];  Service: Gynecology;  Laterality: N/A;  . DILATION AND CURETTAGE OF UTERUS N/A 04/09/2015   Procedure: Robbins IUD removal;  Surgeon: Megan Salon, MD;  Location: [facility];  Service: Gynecology;  Laterality: N/A;  Patient weight 307lbs  . ESOPHAGOGASTRODUODENOSCOPY (EGD) WITH PROPOFOL N/A 02/02/2014   Procedure: ESOPHAGOGASTRODUODENOSCOPY (EGD) WITH PROPOFOL;  Surgeon: Irene Shipper, MD;  [facility];  Service: Endoscopy;  Laterality: N/A;  . GASTRIC BYPASS  1974  . HOLMIUM LASER APPLICATION Left 12/25/4479   Procedure: HOLMIUM LASER APPLICATION;  Surgeon: Bernestine Amass, MD;  [facility];  Service: Urology;  Laterality: Left;  . LITHOTRIPSY  03/2012  . LITHOTRIPSY  2/16  . THYROIDECTOMY  05/15/2010  . TONSILLECTOMY AND ADENOIDECTOMY    . URETEROSCOPY  03/03/2012   Procedure: URETEROSCOPY;  Surgeon: Bernestine Amass, MD;  [facility];  Service: Urology;  Laterality: Left;  . URETEROSCOPY WITH HOLMIUM LASER LITHOTRIPSY Bilateral 01/15/2018   Procedure: CYSTOSCOPY/BILATERAL URETEROSCOPY WITH HOLMIUM LASER LITHOTRIPSY STONE  REMOVAL;  Surgeon: Ardis Hughs, MD;  [facility];  Service: Urology;  Laterality: Bilateral;    Family History  Problem Relation Age of Onset  . Hypertension Father   . Diabetes Father   . Lung cancer Father   . Heart attack Father        MI at age 58  . Hypertension Mother   . Hyperthyroidism Mother   . Heart disease Mother   . Heart attack Mother        MI at age 20  . Asthma Brother   . Hypertension Brother        younger  . Heart disease Brother        older  . Colon cancer Neg Hx   . Esophageal cancer Neg Hx   . Stomach cancer Neg Hx   . Kidney disease Neg Hx   . Liver disease Neg Hx     Allergies  Allergen Reactions  . Other Anaphylaxis    NUTS  . Penicillins Other (See Comments)    Headache. And hives  Has patient had a PCN reaction causing immediate rash, facial/tongue/throat swelling, SOB or lightheadedness with hypotension: No Has patient had a PCN reaction causing severe rash involving mucus membranes or skin necrosis: No Has patient had a PCN reaction that required hospitalization: No Has patient had a PCN reaction occurring within the last 10 years: Yes If all of the above answers are "NO", then may proceed with Cephalosporin use.   [name] Amoxicillin-Pot Clavulanate Other (See Comments)    headache  . Ciprofloxacin Nausea Only and Rash  . Food Hives and Other (See Comments)    Potato  . Tomato Hives    Current Outpatient Medications on File Prior to Visit  Medication Sig Dispense Refill  . acetaminophen (TYLENOL) 325 MG tablet Take 2 tablets (650 mg total) by mouth every 6 (six) hours as needed  for mild pain or moderate pain. 60 tablet 0  . albuterol (PROVENTIL HFA;VENTOLIN HFA) 108 (90 Base) MCG/ACT inhaler Inhale 2 puffs into the lungs every 6 (six) hours as needed for wheezing or shortness of breath. 1 Inhaler 5  . albuterol (PROVENTIL) (2.5 MG/3ML) 0.083% nebulizer solution Take 3 mLs (2.5 mg total) by nebulization every 6 (six) hours as  needed for wheezing or shortness of breath. 150 mL 1  . apixaban (ELIQUIS) 5 MG TABS tablet Take 5 mg by mouth 2 (two) times daily.    . calcitRIOL (ROCALTROL) 0.5 MCG capsule Take 2 mcg by mouth daily.     . calcium elemental as carbonate (TUMS ULTRA 1000) 400 MG chewable tablet Chew 2,000 mg by mouth 2 (two) times daily.    . citric acid-potassium citrate (POLYCITRA) 1100-334 MG/5ML solution Take 1 mL by mouth daily.     . cyanocobalamin (,VITAMIN B-12,) 1000 MCG/ML injection Inject 1,000 mcg into the muscle every 30 (thirty) days.    [name] desoximetasone (TOPICORT) 0.05 % cream Apply topically 2 (two) times daily as needed. (Patient taking differently: Apply 1 application topically 2 (two) times daily as needed (for rash). ) 60 g 0  . diclofenac sodium (VOLTAREN) 1 % GEL Apply 2 g topically 4 (four) times daily. 100 g 3  . diltiazem (CARDIZEM CD) 240 MG 24 hr capsule TAKE 1 CAPSULE BY MOUTH  DAILY (Patient taking differently: Take 240 mg by mouth daily. ) 90 capsule 1  . diphenhydrAMINE (BENADRYL) 25 mg capsule Take 25 mg by mouth every 6 (six) hours as needed for itching.     . diphenhydrAMINE-PE-APAP (RA SEVERE ALLERGY PLUS SINUS PO) Take 2 tablets by mouth daily as needed (for severe allergy).    [name] EPINEPHrine (EPIPEN 2-PAK) 0.3 mg/0.3 mL IJ SOAJ injection Inject 0.3 mg into the muscle once.    . fluocinonide (LIDEX) 0.05 % external solution Apply 1 application topically 2 (two) times daily as needed (for rash).    . fluticasone (VERAMYST) 27.5 MCG/SPRAY nasal spray Place 2 sprays into the nose daily.    . furosemide (LASIX) 40 MG tablet TAKE 1 tablet by mouth in the morning and 2 tabs in the evening 270 tablet 1  . hydrochlorothiazide (HYDRODIURIL) 25 MG tablet Take 1 tablet (25 mg total) by mouth daily. In the AM 30 tablet 3  . levocetirizine (XYZAL) 5 MG tablet TAKE 1 TABLET BY MOUTH  EVERY EVENING (Patient taking differently: Take 5 mg by mouth every evening. ) 90 tablet 3  . levothyroxine  (SYNTHROID, LEVOTHROID) 100 MCG tablet Take 350 mcg by mouth daily before breakfast.     . loratadine (CLARITIN) 10 MG tablet Take 10 mg by mouth every morning.    . Melatonin 5 MG TABS Take 5 mg by mouth at bedtime as needed (for sleep).     . montelukast (SINGULAIR) 10 MG tablet Take 1 tablet (10 mg total) by mouth at bedtime. 30 tablet 3  . nystatin (MYCOSTATIN) 100000 UNIT/ML suspension TAKE 5 ML BY MOUTH FOUR TIMES DAILY 200 mL 0  . nystatin (MYCOSTATIN/NYSTOP) powder Apply topically 3 (three) times daily. 15 g 0  . nystatin cream (MYCOSTATIN) Apply 1 application topically 2 (two) times daily. 30 g 2  . omeprazole (PRILOSEC) 40 MG capsule TAKE 1 CAPSULE BY MOUTH  DAILY (Patient taking differently: Take 40 mg by mouth daily. ) 90 capsule 1  . ondansetron (ZOFRAN) 4 MG tablet Take 1 tablet (4 mg total) by mouth every  8 (eight) hours as needed for nausea or vomiting. 30 tablet 0  . saccharomyces boulardii (FLORASTOR) 250 MG capsule Take 1 capsule (250 mg total) by mouth 2 (two) times daily. (Patient taking differently: Take 250 mg by mouth daily. ) 30 capsule 0  . SYMBICORT 160-4.5 MCG/ACT inhaler INHALE 2 PUFFS BY MOUTH TWO TIMES DAILY 30.6 g 1  . traMADol (ULTRAM) 50 MG tablet Take 1 tablet (50 mg total) by mouth every 12 (twelve) hours as needed. 12 tablet 0  . valACYclovir (VALTREX) 1000 MG tablet 2 tabs by mouth now and again in 12 hours 4 tablet 0  . Vitamin D, Ergocalciferol, (DRISDOL) 1.25 MG (50000 UT) CAPS capsule Take 1 capsule (50,000 Units total) by mouth every Monday, Wednesday, and Friday. 39 capsule 1  . zafirlukast (ACCOLATE) 20 MG tablet TAKE 1 TABLET BY MOUTH TWO  TIMES DAILY BEFORE MEALS (Patient taking differently: Take 20 mg by mouth 2 (two) times daily before a meal. ) 180 tablet 1   No current facility-administered medications on file prior to visit.     BP (!) 114/54 (BP Location: Right Arm, Patient Position: Sitting, Cuff Size: Large)   Pulse 85   Temp 98.7 F (37.1  C) (Oral)   Resp 16   Ht 5\' 4"  (1.626 m)   Wt (!) 303 lb (137.4 kg)   LMP 06/11/2012   SpO2 94%   BMI 52.01 kg/m       Objective:   Physical Exam Constitutional:      Appearance: She is well-developed.  HENT:     Head: Normocephalic and atraumatic.     Right Ear: Ear canal normal.     Left Ear: Tympanic membrane and ear canal normal.     Ears:     Comments: R TM is mildly erythematous Neck:     Musculoskeletal: Neck supple.     Thyroid: No thyromegaly.  Cardiovascular:     Rate and Rhythm: Normal rate and regular rhythm.     Heart sounds: Normal heart sounds. No murmur.  Pulmonary:     Effort: Pulmonary effort is normal. No respiratory distress.     Breath sounds: No decreased air movement. Wheezing and rhonchi (few scattered rhonchi) present. No decreased breath sounds.  Musculoskeletal:     Comments: + swelling and tenderness of right medial knee  Skin:    General: Skin is warm and dry.     Comments: Macular rash noted on right arm, chest  Neurological:     Mental Status: She is alert and oriented to person, place, and time.  Psychiatric:        Behavior: Behavior normal.        Thought Content: Thought content normal.        Judgment: Judgment normal.           Assessment & Plan:  Bronchitis with bronchospasm- Advised pt as follows:  Start zpak. Start prednisone taper. Continue albuterol (inhaler or neb) every 6 hours until you are feeling much better. Call if symptoms worsen or if 3-4 days symptoms are not much better.   R knee pain- obtain x-ray to rule out fracture, continue voltaren gel.  Skin rash- should improve with prednisone taper.

## 2018-07-01 ENCOUNTER — Encounter: Payer: Self-pay | Admitting: Family

## 2018-07-01 DIAGNOSIS — E039 Hypothyroidism, unspecified: Secondary | ICD-10-CM | POA: Diagnosis not present

## 2018-07-01 DIAGNOSIS — E892 Postprocedural hypoparathyroidism: Secondary | ICD-10-CM | POA: Diagnosis not present

## 2018-07-01 DIAGNOSIS — Z85850 Personal history of malignant neoplasm of thyroid: Secondary | ICD-10-CM | POA: Diagnosis not present

## 2018-07-08 ENCOUNTER — Ambulatory Visit (INDEPENDENT_AMBULATORY_CARE_PROVIDER_SITE_OTHER): Payer: 59

## 2018-07-08 ENCOUNTER — Encounter: Payer: Self-pay | Admitting: Podiatry

## 2018-07-08 ENCOUNTER — Other Ambulatory Visit: Payer: Self-pay | Admitting: Podiatry

## 2018-07-08 ENCOUNTER — Ambulatory Visit: Payer: 59 | Admitting: Podiatry

## 2018-07-08 DIAGNOSIS — M2012 Hallux valgus (acquired), left foot: Secondary | ICD-10-CM

## 2018-07-08 DIAGNOSIS — M205X1 Other deformities of toe(s) (acquired), right foot: Secondary | ICD-10-CM

## 2018-07-08 DIAGNOSIS — M2060 Acquired deformities of toe(s), unspecified, unspecified foot: Secondary | ICD-10-CM | POA: Diagnosis not present

## 2018-07-08 DIAGNOSIS — M79671 Pain in right foot: Secondary | ICD-10-CM

## 2018-07-08 DIAGNOSIS — M2030 Hallux varus (acquired), unspecified foot: Secondary | ICD-10-CM | POA: Diagnosis not present

## 2018-07-08 DIAGNOSIS — M79672 Pain in left foot: Secondary | ICD-10-CM

## 2018-07-08 MED ORDER — TRAMADOL HCL 50 MG PO TABS: 50.0000 mg | ORAL_TABLET | Freq: Two times a day (BID) | ORAL | 0 refills | Status: DC | PRN

## 2018-07-12 ENCOUNTER — Other Ambulatory Visit: Payer: Self-pay | Admitting: Podiatry

## 2018-07-12 DIAGNOSIS — M2012 Hallux valgus (acquired), left foot: Secondary | ICD-10-CM

## 2018-07-16 ENCOUNTER — Other Ambulatory Visit: Payer: Self-pay | Admitting: Family

## 2018-07-19 NOTE — Progress Notes (Signed)
Subjective: 63 year old female presents the office today for follow-up evaluation of continued pain of hallux varus on both sides of the right side worse than left.  She was scheduled for surgery the beginning of March however she is been having quite an issue with upper respiratory infections and she has been on antibiotics.  Despite antibiotic she is continued to have symptoms and she is try to follow-up with her primary care physician.  She wants to hold off on surgery until this completely clears.  I agree with this. Denies any systemic complaints such as fevers, chills, nausea, vomiting. No acute changes since last appointment, and no other complaints at this time.   Objective: AAO x3, NAD DP/PT pulses palpable bilaterally, CRT less than 3 seconds Significant hallux varus is present bilaterally with a left side worse than right however the right side is more symptomatic.  There is tenderness palpation along the hallux and she is having to wear surgical shoe as she cannot tolerate regular shoes given the position of her toes.  No open lesions or pre-ulcerative lesions.  No pain with calf compression, swelling, warmth, erythema  Assessment: Hallux varus  Plan: -All treatment options discussed with the patient including all alternatives, risks, complications.  -X-rays were obtained and reviewed.  Hallux varus present but there is no evidence of acute fracture or any significant changes. -She wants to continue with surgery.  We again discussed MPJ fusion versus amputation.  She was to try to save the toe on the right side.  We discussed MPJ arthrodesis.  We again discussed the surgery as well as postoperative course.  However would not hold off on surgery for now as she follows up with her primary care physician.  She would like to do this.  We will plan on doing this the end of March.  When she sees her primary care physician to let me know and we will get her rescheduled.  We again reviewed the  consent forms again today and she re-signed them.  We can discuss alternatives, risks, complications. -Patient encouraged to call the office with any questions, concerns, change in symptoms.   Trula Slade DPM

## 2018-07-21 ENCOUNTER — Inpatient Hospital Stay: Payer: 59

## 2018-07-21 ENCOUNTER — Inpatient Hospital Stay: Payer: 59 | Attending: Hematology & Oncology | Admitting: Hematology & Oncology

## 2018-07-21 ENCOUNTER — Other Ambulatory Visit: Payer: Self-pay

## 2018-07-21 VITALS — BP 141/47 | HR 73 | Temp 97.8°F | Resp 20 | Wt 303.8 lb

## 2018-07-21 DIAGNOSIS — Z9884 Bariatric surgery status: Secondary | ICD-10-CM

## 2018-07-21 DIAGNOSIS — Z79899 Other long term (current) drug therapy: Secondary | ICD-10-CM | POA: Diagnosis not present

## 2018-07-21 DIAGNOSIS — D508 Other iron deficiency anemias: Secondary | ICD-10-CM

## 2018-07-21 DIAGNOSIS — D509 Iron deficiency anemia, unspecified: Secondary | ICD-10-CM | POA: Diagnosis present

## 2018-07-21 DIAGNOSIS — Z7901 Long term (current) use of anticoagulants: Secondary | ICD-10-CM

## 2018-07-21 DIAGNOSIS — D500 Iron deficiency anemia secondary to blood loss (chronic): Secondary | ICD-10-CM

## 2018-07-21 DIAGNOSIS — I4891 Unspecified atrial fibrillation: Secondary | ICD-10-CM | POA: Insufficient documentation

## 2018-07-21 DIAGNOSIS — D510 Vitamin B12 deficiency anemia due to intrinsic factor deficiency: Secondary | ICD-10-CM

## 2018-07-21 DIAGNOSIS — K909 Intestinal malabsorption, unspecified: Secondary | ICD-10-CM | POA: Insufficient documentation

## 2018-07-21 LAB — CBC WITH DIFFERENTIAL (CANCER CENTER ONLY)
ABS IMMATURE GRANULOCYTES: 0.07 10*3/uL (ref 0.00–0.07)
Basophils Absolute: 0 10*3/uL (ref 0.0–0.1)
Basophils Relative: 0 %
Eosinophils Absolute: 0.1 10*3/uL (ref 0.0–0.5)
Eosinophils Relative: 2 %
HCT: 38.3 % (ref 36.0–46.0)
HEMOGLOBIN: 11.5 g/dL — AB (ref 12.0–15.0)
Immature Granulocytes: 1 %
Lymphocytes Relative: 12 %
Lymphs Abs: 1 10*3/uL (ref 0.7–4.0)
MCH: 27.3 pg (ref 26.0–34.0)
MCHC: 30 g/dL (ref 30.0–36.0)
MCV: 90.8 fL (ref 80.0–100.0)
MONO ABS: 0.5 10*3/uL (ref 0.1–1.0)
Monocytes Relative: 6 %
Neutro Abs: 7.1 10*3/uL (ref 1.7–7.7)
Neutrophils Relative %: 79 %
Platelet Count: 205 10*3/uL (ref 150–400)
RBC: 4.22 MIL/uL (ref 3.87–5.11)
RDW: 16.3 % — ABNORMAL HIGH (ref 11.5–15.5)
WBC Count: 8.9 10*3/uL (ref 4.0–10.5)
nRBC: 0 % (ref 0.0–0.2)

## 2018-07-21 LAB — IRON AND TIBC
Iron: 35 ug/dL — ABNORMAL LOW (ref 41–142)
Saturation Ratios: 12 % — ABNORMAL LOW (ref 21–57)
TIBC: 286 ug/dL (ref 236–444)
UIBC: 251 ug/dL (ref 120–384)

## 2018-07-21 LAB — RETICULOCYTES
Immature Retic Fract: 18.4 % — ABNORMAL HIGH (ref 2.3–15.9)
RBC.: 4.22 MIL/uL (ref 3.87–5.11)
Retic Count, Absolute: 136.3 10*3/uL (ref 19.0–186.0)
Retic Ct Pct: 3.2 % — ABNORMAL HIGH (ref 0.4–3.1)

## 2018-07-21 LAB — VITAMIN B12: Vitamin B-12: 554 pg/mL (ref 180–914)

## 2018-07-21 LAB — FERRITIN: Ferritin: 1945 ng/mL — ABNORMAL HIGH (ref 11–307)

## 2018-07-21 MED ORDER — CYANOCOBALAMIN 1000 MCG/ML IJ SOLN
1000.0000 ug | Freq: Once | INTRAMUSCULAR | Status: AC
  Administered 2018-07-21: 1000 ug via INTRAMUSCULAR

## 2018-07-21 MED ORDER — CYANOCOBALAMIN 1000 MCG/ML IJ SOLN
INTRAMUSCULAR | Status: AC
  Filled 2018-07-21: qty 1

## 2018-07-21 NOTE — Patient Instructions (Signed)
Cyanocobalamin, Vitamin B12 injection What is this medicine? CYANOCOBALAMIN (sye an oh koe BAL a min) is a man made form of vitamin B12. Vitamin B12 is used in the growth of healthy blood cells, nerve cells, and proteins in the body. It also helps with the metabolism of fats and carbohydrates. This medicine is used to treat people who can not absorb vitamin B12. This medicine may be used for other purposes; ask your health care provider or pharmacist if you have questions. COMMON BRAND NAME(S): B-12 Compliance Kit, B-12 Injection Kit, Cyomin, LA-12, Nutri-Twelve, Physicians EZ Use B-12, Primabalt What should I tell my health care provider before I take this medicine? They need to know if you have any of these conditions: -kidney disease -Leber's disease -megaloblastic anemia -an unusual or allergic reaction to cyanocobalamin, cobalt, other medicines, foods, dyes, or preservatives -pregnant or trying to get pregnant -breast-feeding How should I use this medicine? This medicine is injected into a muscle or deeply under the skin. It is usually given by a health care professional in a clinic or doctor's office. However, your doctor may teach you how to inject yourself. Follow all instructions. Talk to your pediatrician regarding the use of this medicine in children. Special care may be needed. Overdosage: If you think you have taken too much of this medicine contact a poison control center or emergency room at once. NOTE: This medicine is only for you. Do not share this medicine with others. What if I miss a dose? If you are given your dose at a clinic or doctor's office, call to reschedule your appointment. If you give your own injections and you miss a dose, take it as soon as you can. If it is almost time for your next dose, take only that dose. Do not take double or extra doses. What may interact with this medicine? -colchicine -heavy alcohol intake This list may not describe all possible  interactions. Give your health care provider a list of all the medicines, herbs, non-prescription drugs, or dietary supplements you use. Also tell them if you smoke, drink alcohol, or use illegal drugs. Some items may interact with your medicine. What should I watch for while using this medicine? Visit your doctor or health care professional regularly. You may need blood work done while you are taking this medicine. You may need to follow a special diet. Talk to your doctor. Limit your alcohol intake and avoid smoking to get the best benefit. What side effects may I notice from receiving this medicine? Side effects that you should report to your doctor or health care professional as soon as possible: -allergic reactions like skin rash, itching or hives, swelling of the face, lips, or tongue -blue tint to skin -chest tightness, pain -difficulty breathing, wheezing -dizziness -red, swollen painful area on the leg Side effects that usually do not require medical attention (report to your doctor or health care professional if they continue or are bothersome): -diarrhea -headache This list may not describe all possible side effects. Call your doctor for medical advice about side effects. You may report side effects to FDA at 1-800-FDA-1088. Where should I keep my medicine? Keep out of the reach of children. Store at room temperature between 15 and 30 degrees C (59 and 85 degrees F). Protect from light. Throw away any unused medicine after the expiration date. NOTE: This sheet is a summary. It may not cover all possible information. If you have questions about this medicine, talk to your doctor, pharmacist, or   health care provider.  2019 Elsevier/Gold Standard (2007-08-23 22:10:20)  

## 2018-07-21 NOTE — Progress Notes (Signed)
Hematology and Oncology Follow Up Visit  [name] [id number] [birth year] 63 y.o. 07/21/2018   Principle Diagnosis:  Iron deficiency anemia Pernicious anemia Gastric bypass with malabsorption Atrial fibrillation-chronic  Current Therapy:   IV iron as indicated - last received in November 2018 Vitamin B-12 1 mg IM every month - with PCP Eliquis 5 mg p.o. twice daily   Interim History:  [name] is here today for follow-up.  Unfortunately, she is not yet had surgery for her left foot.  She apparently was scheduled in December.  She had problems with hypocalcemia.  Not sure why she would have a drop in her calcium.  This was corrected.  Hopefully she will have surgery in March..  She is having a hard time getting around.  We did go ahead and give her her B12 injection today.  I did not want her to have to come all the way back to see her primary care doctor in 2 days for a B12 injection.  I let her primary care doctor know that we gave the B12 injection today.  Her last iron studies that we did on her back in October showed a ferritin of 2100 with an iron saturation of only 17%.  Her ferritin is elevated because of the inflammatory conditions.  We did go ahead and give her a dose of iron.  She has had no bleeding.  She has had no nausea or vomiting.  There is been no fever.  She has had no rashes.  There is been no leg swelling..  She is on Eliquis.  She is on Eliquis for atrial fibrillation.  She will stop the Eliquis 3 days before her surgery.  Overall, I said that her performance status is ECOG 2.  Medications:  Allergies as of 07/21/2018      Reactions   Other Anaphylaxis   NUTS   Penicillins Other (See Comments)   Headache. And hives  Has patient had a PCN reaction causing immediate rash, facial/tongue/throat swelling, SOB or lightheadedness with hypotension: No Has patient had a PCN reaction causing severe rash involving mucus membranes or skin necrosis: No Has patient  had a PCN reaction that required hospitalization: No Has patient had a PCN reaction occurring within the last 10 years: Yes If all of the above answers are "NO", then may proceed with Cephalosporin use.   Amoxicillin-pot Clavulanate Other (See Comments)   headache   Ciprofloxacin Nausea Only, Rash   Food Hives, Other (See Comments)   Potato   Tomato Hives      Medication List       Accurate as of July 21, 2018 10:27 AM. Always use your most recent med list.        acetaminophen 325 MG tablet Commonly known as:  TYLENOL Take 2 tablets (650 mg total) by mouth every 6 (six) hours as needed for mild pain or moderate pain.   albuterol 108 (90 Base) MCG/ACT inhaler Commonly known as:  PROVENTIL HFA;VENTOLIN HFA Inhale 2 puffs into the lungs every 6 (six) hours as needed for wheezing or shortness of breath.   albuterol (2.5 MG/3ML) 0.083% nebulizer solution Commonly known as:  PROVENTIL Take 3 mLs (2.5 mg total) by nebulization every 6 (six) hours as needed for wheezing or shortness of breath.   calcitRIOL 0.5 MCG capsule Commonly known as:  ROCALTROL Take 2 mcg by mouth daily.   citric acid-potassium citrate 1100-334 MG/5ML solution Commonly known as:  POLYCITRA Take 1 mL by mouth daily.  cyanocobalamin 1000 MCG/ML injection Commonly known as:  (VITAMIN B-12) Inject 1,000 mcg into the muscle every 30 (thirty) days.   desoximetasone 0.05 % cream Commonly known as:  TOPICORT Apply topically 2 (two) times daily as needed.   diclofenac sodium 1 % Gel Commonly known as:  VOLTAREN Apply 2 g topically 4 (four) times daily.   diltiazem 240 MG 24 hr capsule Commonly known as:  CARDIZEM CD TAKE 1 CAPSULE BY MOUTH  DAILY   diphenhydrAMINE 25 mg capsule Commonly known as:  BENADRYL Take 25 mg by mouth every 6 (six) hours as needed for itching.   ELIQUIS 5 MG Tabs tablet Generic drug:  apixaban Take 5 mg by mouth 2 (two) times daily.   EPIPEN 2-PAK 0.3 mg/0.3 mL Soaj  injection Generic drug:  EPINEPHrine Inject 0.3 mg into the muscle once.   fluocinonide 0.05 % external solution Commonly known as:  LIDEX Apply 1 application topically 2 (two) times daily as needed (for rash).   fluticasone 27.5 MCG/SPRAY nasal spray Commonly known as:  VERAMYST Place 2 sprays into the nose daily.   furosemide 40 MG tablet Commonly known as:  LASIX TAKE 1 tablet by mouth in the morning and 2 tabs in the evening   hydrochlorothiazide 25 MG tablet Commonly known as:  HYDRODIURIL TAKE 1 TABLET BY MOUTH  DAILY   levocetirizine 5 MG tablet Commonly known as:  XYZAL TAKE 1 TABLET BY MOUTH  EVERY EVENING   levothyroxine 100 MCG tablet Commonly known as:  SYNTHROID, LEVOTHROID Take 350 mcg by mouth daily before breakfast.   loratadine 10 MG tablet Commonly known as:  CLARITIN Take 10 mg by mouth every morning.   losartan 50 MG tablet Commonly known as:  COZAAR   Melatonin 5 MG Tabs Take 5 mg by mouth at bedtime as needed (for sleep).   montelukast 10 MG tablet Commonly known as:  SINGULAIR Take 1 tablet (10 mg total) by mouth at bedtime.   nystatin 100000 UNIT/ML suspension Commonly known as:  MYCOSTATIN TAKE 5 ML BY MOUTH FOUR TIMES DAILY   nystatin powder Commonly known as:  MYCOSTATIN/NYSTOP Apply topically 3 (three) times daily.   nystatin cream Commonly known as:  MYCOSTATIN Apply 1 application topically 2 (two) times daily.   omeprazole 40 MG capsule Commonly known as:  PRILOSEC TAKE 1 CAPSULE BY MOUTH  DAILY   ondansetron 4 MG tablet Commonly known as:  ZOFRAN Take 1 tablet (4 mg total) by mouth every 8 (eight) hours as needed for nausea or vomiting.   RA SEVERE ALLERGY PLUS SINUS PO Take 2 tablets by mouth daily as needed (for severe allergy).   saccharomyces boulardii 250 MG capsule Commonly known as:  FLORASTOR Take 1 capsule (250 mg total) by mouth 2 (two) times daily.   SYMBICORT 160-4.5 MCG/ACT inhaler Generic drug:   budesonide-formoterol INHALE 2 PUFFS BY MOUTH TWO TIMES DAILY   traMADol 50 MG tablet Commonly known as:  ULTRAM Take 1 tablet (50 mg total) by mouth every 12 (twelve) hours as needed.   TUMS ULTRA 1000 400 MG chewable tablet Generic drug:  calcium elemental as carbonate Chew 2,000 mg by mouth 2 (two) times daily.   valACYclovir 1000 MG tablet Commonly known as:  VALTREX 2 tabs by mouth now and again in 12 hours   Vitamin D (Ergocalciferol) 1.25 MG (50000 UT) Caps capsule Commonly known as:  DRISDOL Take 1 capsule (50,000 Units total) by mouth every Monday, Wednesday, and Friday.   zafirlukast  20 MG tablet Commonly known as:  ACCOLATE TAKE 1 TABLET BY MOUTH TWO  TIMES DAILY BEFORE MEALS       Allergies:  Allergies  Allergen Reactions  . Other Anaphylaxis    NUTS  . Penicillins Other (See Comments)    Headache. And hives  Has patient had a PCN reaction causing immediate rash, facial/tongue/throat swelling, SOB or lightheadedness with hypotension: No Has patient had a PCN reaction causing severe rash involving mucus membranes or skin necrosis: No Has patient had a PCN reaction that required hospitalization: No Has patient had a PCN reaction occurring within the last 10 years: Yes If all of the above answers are "NO", then may proceed with Cephalosporin use.   [name] Amoxicillin-Pot Clavulanate Other (See Comments)    headache  . Ciprofloxacin Nausea Only and Rash  . Food Hives and Other (See Comments)    Potato  . Tomato Hives    Past Medical History, Surgical history, Social history, and Family History were reviewed and updated.  Review of Systems: Review of Systems  Constitutional: Negative.   HENT: Negative.   Eyes: Negative.   Respiratory: Negative.   Cardiovascular: Negative.   Gastrointestinal: Negative.   Genitourinary: Negative.   Musculoskeletal: Positive for joint pain.  Skin: Negative.   Neurological: Negative.   Endo/Heme/Allergies: Bruises/bleeds  easily.  Psychiatric/Behavioral: Negative.      Physical Exam:  weight is 303 lb 12 oz (137.8 kg) (abnormal). Her oral temperature is 97.8 F (36.6 C). Her blood pressure is 141/47 (abnormal) and her pulse is 73. Her respiration is 20 and oxygen saturation is 97%.   Wt Readings from Last 3 Encounters:  07/21/18 (!) 303 lb 12 oz (137.8 kg)  06/30/18 (!) 303 lb (137.4 kg)  05/05/18 (!) 309 lb (140.2 kg)    Physical Exam Vitals signs reviewed.  HENT:     Head: Normocephalic and atraumatic.  Eyes:     Pupils: Pupils are equal, round, and reactive to light.  Neck:     Musculoskeletal: Normal range of motion.  Cardiovascular:     Rate and Rhythm: Normal rate and regular rhythm.     Heart sounds: Normal heart sounds.  Pulmonary:     Effort: Pulmonary effort is normal.     Breath sounds: Normal breath sounds.  Abdominal:     General: Bowel sounds are normal.     Palpations: Abdomen is soft.  Musculoskeletal: Normal range of motion.        General: No tenderness or deformity.  Lymphadenopathy:     Cervical: No cervical adenopathy.  Skin:    General: Skin is warm and dry.     Findings: No erythema or rash.  Neurological:     Mental Status: She is alert and oriented to person, place, and time.  Psychiatric:        Behavior: Behavior normal.        Thought Content: Thought content normal.        Judgment: Judgment normal.     Lab Results  Component Value Date   WBC 8.9 07/21/2018   HGB 11.5 (L) 07/21/2018   HCT 38.3 07/21/2018   MCV 90.8 07/21/2018   PLT 205 07/21/2018   Lab Results  Component Value Date   FERRITIN 2,078 (H) 03/24/2018   IRON 47 03/24/2018   TIBC 270 03/24/2018   UIBC 223 03/24/2018   IRONPCTSAT 17 (L) 03/24/2018   Lab Results  Component Value Date   RETICCTPCT 3.2 (H) 07/21/2018  RBC 4.22 07/21/2018   RBC 4.22 07/21/2018   RETICCTABS 131.4 03/30/2015   No results found for: Nils Pyle [facility] Lab Results  Component  Value Date   IGGSERUM 1250 06/09/2007   IGA 334 06/09/2007   IGMSERUM 244 06/09/2007   Lab Results  Component Value Date   TOTALPROTELP 7.0 06/09/2007     Chemistry      Component Value Date/Time   NA 138 05/05/2018 1011   NA 144 03/30/2017 1455   NA 139 07/30/2015 1302   K 3.5 05/05/2018 1011   K 3.7 03/30/2017 1455   K 3.5 07/30/2015 1302   CL 89 (L) 05/05/2018 1011   CL 99 03/30/2017 1455   CO2 38 (H) 05/05/2018 1011   CO2 30 03/30/2017 1455   CO2 25 07/30/2015 1302   BUN 27 (H) 05/05/2018 1011   BUN 14 03/30/2017 1455   BUN 16.8 07/30/2015 1302   CREATININE 0.85 05/05/2018 1011   CREATININE 0.89 03/24/2018 0931   CREATININE 0.8 03/30/2017 1455   CREATININE 0.8 07/30/2015 1302      Component Value Date/Time   CALCIUM 8.6 05/05/2018 1011   CALCIUM 8.7 03/30/2017 1455   CALCIUM 8.8 07/30/2015 1302   ALKPHOS 119 (H) 03/29/2018 1046   ALKPHOS 107 (H) 03/30/2017 1455   ALKPHOS 99 07/30/2015 1302   AST 21 03/29/2018 1046   AST 23 03/24/2018 0931   AST 36 (H) 07/30/2015 1302   ALT 26 03/29/2018 1046   ALT 28 03/24/2018 0931   ALT 28 03/30/2017 1455   ALT 68 (H) 07/30/2015 1302   BILITOT 0.6 03/29/2018 1046   BILITOT 0.6 03/24/2018 0931   BILITOT 0.41 07/30/2015 1302      Impression and Plan: [name] is a very pleasant 63 yo caucasian female with iron deficiency anemia secondary to malabsorption after gastric bypass. She also has pernicious anemia and receives monthly B 12 injections with her PCP.   I would like to see her back in 3 months.  We will see what her iron levels are.  Her hemoglobin is down a little bit from what she typically is.  As such, she might be iron deficient.  Again, we gave her the vitamin B-12 today.     Volanda Napoleon, MD 2/26/202010:27 AM

## 2018-07-23 ENCOUNTER — Ambulatory Visit: Payer: 59

## 2018-07-28 ENCOUNTER — Ambulatory Visit: Admit: 2018-07-28 | Payer: 59 | Admitting: Podiatry

## 2018-07-28 SURGERY — FUSION, JOINT, GREAT TOE
Anesthesia: Monitor Anesthesia Care | Laterality: Right

## 2018-08-04 ENCOUNTER — Other Ambulatory Visit: Payer: Self-pay

## 2018-08-04 ENCOUNTER — Telehealth: Payer: Self-pay | Admitting: *Deleted

## 2018-08-04 ENCOUNTER — Ambulatory Visit: Payer: 59 | Admitting: Family

## 2018-08-04 ENCOUNTER — Encounter: Payer: Self-pay | Admitting: Family

## 2018-08-04 VITALS — BP 126/55 | HR 81 | Temp 97.6°F | Resp 18 | Ht 64.0 in | Wt 297.8 lb

## 2018-08-04 DIAGNOSIS — I10 Essential (primary) hypertension: Secondary | ICD-10-CM | POA: Diagnosis not present

## 2018-08-04 DIAGNOSIS — E538 Deficiency of other specified B group vitamins: Secondary | ICD-10-CM

## 2018-08-04 DIAGNOSIS — E876 Hypokalemia: Secondary | ICD-10-CM

## 2018-08-04 DIAGNOSIS — J309 Allergic rhinitis, unspecified: Secondary | ICD-10-CM

## 2018-08-04 DIAGNOSIS — Z87442 Personal history of urinary calculi: Secondary | ICD-10-CM

## 2018-08-04 LAB — BASIC METABOLIC PANEL
BUN: 26 mg/dL — ABNORMAL HIGH (ref 6–23)
CHLORIDE: 86 meq/L — AB (ref 96–112)
CO2: 37 mEq/L — ABNORMAL HIGH (ref 19–32)
Calcium: 8.6 mg/dL (ref 8.4–10.5)
Creatinine, Ser: 0.83 mg/dL (ref 0.40–1.20)
GFR: 69.84 mL/min (ref >60.00)
Glucose, Bld: 100 mg/dL — ABNORMAL HIGH (ref 70–99)
POTASSIUM: 2.8 meq/L — AB (ref 3.5–5.1)
Sodium: 135 mEq/L (ref 135–145)

## 2018-08-04 MED ORDER — POTASSIUM CHLORIDE CRYS ER 20 MEQ PO TBCR: 20.0000 meq | EXTENDED_RELEASE_TABLET | Freq: Every day | ORAL | 3 refills | Status: DC

## 2018-08-04 MED ORDER — "SYRINGE 20G X 1"" 3 ML MISC": 0 refills | Status: DC

## 2018-08-04 MED ORDER — CYANOCOBALAMIN 1000 MCG/ML IJ SOLN: 1000.0000 ug | INTRAMUSCULAR | 4 refills | Status: DC

## 2018-08-04 NOTE — Telephone Encounter (Signed)
CRITICAL VALUE STICKER  CRITICAL VALUE:  Potassium  2.8  RECEIVER (on-site recipient of call):  Kelle Darting, [place] NOTIFIED:  08/04/18 @ 1:12pm  MESSENGER (representative from lab): Roger Kill  MD NOTIFIED:  Melisa  TIME OF NOTIFICATION: 1:12pm  RESPONSE:

## 2018-08-04 NOTE — Telephone Encounter (Signed)
Results given to patient, advised to take kdur 2 now,2 at bedtime and 1 qd after that.. She will come back Friday for bmet.

## 2018-08-04 NOTE — Progress Notes (Signed)
Subjective:    Patient ID: [name], female    DOB: November 27, 1955, 63 y.o.   MRN: [id number]  HPI   Patient is a 63 yr old female who presents today for follow up.    HTN- reports that she is not taking  BP Readings from Last 3 Encounters:  08/04/18 (!) 126/55  07/21/18 (!) 141/47  06/30/18 (!) 114/54   Nausea- reports some nausea and weight loss.  Denies abdominal pain or black/bloody stools.  Goes once every 2-3 days.   Wt Readings from Last 3 Encounters:  08/04/18 297 lb 12.8 oz (135.1 kg)  07/21/18 (!) 303 lb 12 oz (137.8 kg)  06/30/18 (!) 303 lb (137.4 kg)   HTN- on hctz and cardizem.   BP Readings from Last 3 Encounters:  08/04/18 (!) 126/55  07/21/18 (!) 141/47  06/30/18 (!) 114/54   Allergies- taking xyzal, singulair, has post nasal drip.  Vit D- continues 3 x weekly.   gerd- reports symptoms are stable.  Continues to follow up with endo.    Recently passed a small kidney stones.   Back to work now.      Review of Systems    see HPI  Past Medical History:  Diagnosis Date  . Abnormal Pap smear    years ago/no biopsy  . Allergic rhinitis   . Anemia, iron deficiency    iron infusion scheduled for 04/02/2018  . Aortic atherosclerosis ([place])   . Arthritis    back- lower  . Asthma   . Atrial fibrillation ([place]) August 2012   OFF XARELTO LAST MONTH DUE TO BLEEDING IN STOOL  . Bursitis of hip left  . Chronic diastolic (congestive) heart failure (HCC)    pt unaware of this  . COPD (chronic obstructive pulmonary disease) with chronic bronchitis ([place])   . Decreased pulses in feet   . Dysrhythmia    afib, followed by Dr. Stanford Breed   . Edema of both legs   . Esophagitis 02/02/2014   Distal, linear erosions, noted on endoscopy  . Fatty liver 01/07/2012  . Fibroid 1974   fibroid cyst on left fallopian tube  . Fibromyalgia   . Foot ulcer ([place])    AREA HEALED RIGHT FOOT  . GERD (gastroesophageal reflux disease)   . Headache(784.0)    occasional  sinus headache   . Hepatomegaly   . History of blood transfusion JULY 2015  . History of cardiomegaly 12/06/2010   Noted on CT  . History of E. coli septicemia   . Hypertension   . Hypoparathyroidism ([place]) 01/07/2011  . Hypothyroidism   . Kidney stone 10/12/15   passed on their own  . Morbid obesity ([place])   . MRSA infection   . Nephrolithiasis 2/16, 9/16   SEES DR Risa Grill  . Pernicious anemia 02/20/2014   followed by Debbrah Alar  . Pneumonia   . PONV (postoperative nausea and vomiting)   . Rash    thighs and back  . Seizures ([place])    infancy secondary to fever  . Staph aureus infection   . Thoracic spondylosis 12/06/2010   Noted on CT  . Thyroid cancer ([place]) 2011   THYROIDECTOMY DONE  . Uterine cancer ([place]) 2014   Mirena IUD  . Vitamin B 12 deficiency   . Vitamin D deficiency      Social History   Socioeconomic History  . Marital status: Single    Spouse name: Not on file  . Number of children: 0  .  Years of education: Not on file  . Highest education level: Not on file  Occupational History  . Occupation: works in Insurance claims handler  . Occupation: DESIGN COMPUTER CHIP    Employer: [employer]  Social Needs  . Financial resource strain: Not on file  . Food insecurity:    Worry: Not on file    Inability: Not on file  . Transportation needs:    Medical: Not on file    Non-medical: Not on file  Tobacco Use  . Smoking status: Former Smoker    Packs/day: 0.50    Years: 25.00    Pack years: 12.50    Types: Cigarettes    Start date: 12/24/1970    Last attempt to quit: 05/27/1995    Years since quitting: 23.2  . Smokeless tobacco: Never Used  . Tobacco comment: quit smoking 19 years ago  Substance and Sexual Activity  . Alcohol use: Yes    Alcohol/week: 0.0 standard drinks    Comment: 1/2 glass per month  . Drug use: No  . Sexual activity: Yes    Partners: Male    Birth control/protection: Post-menopausal  Lifestyle  . Physical activity:    Days per  week: Not on file    Minutes per session: Not on file  . Stress: Not on file  Relationships  . Social connections:    Talks on phone: Not on file    Gets together: Not on file    Attends religious service: Not on file    Active member of club or organization: Not on file    Attends meetings of clubs or organizations: Not on file    Relationship status: Not on file  . Intimate partner violence:    Fear of current or ex partner: Not on file    Emotionally abused: Not on file    Physically abused: Not on file    Forced sexual activity: Not on file  Other Topics Concern  . Not on file  Social History Narrative   Occupation: works in Insurance claims handler - Field seismologist   Single       Former Smoker - quit tobacco 12 years ago.  She was light smoker for 10 years.                 Past Surgical History:  Procedure Laterality Date  . AMPUTATION Right 05/18/2014   Procedure: RIGHT FIFTH RAY AMPUTATION FOOT;  Surgeon: Wylene Simmer, MD;  Location: [facility];  Service: Orthopedics;  Laterality: Right;  . APPENDECTOMY    . BUNIONECTOMY     bilateral  . COLONOSCOPY WITH PROPOFOL N/A 02/02/2014   Procedure: COLONOSCOPY WITH PROPOFOL;  Surgeon: Irene Shipper, MD;  Location: [facility];  Service: Endoscopy;  Laterality: N/A;  . cyst on ovary removed     . CYSTOSCOPY W/ RETROGRADES  03/03/2012   Procedure: CYSTOSCOPY WITH RETROGRADE PYELOGRAM;  Surgeon: Bernestine Amass, MD;  Location: [facility];  Service: Urology;  Laterality: Bilateral;  . CYSTOSCOPY W/ URETERAL STENT PLACEMENT Right 11/25/2017   Procedure: CYSTOSCOPY WITH RETROGRADE RIGHT URETERAL STENT PLACEMENT;  Surgeon: Franchot Gallo, MD;  Location: [facility];  Service: Urology;  Laterality: Right;  . CYSTOSCOPY W/ URETERAL STENT PLACEMENT Right 01/15/2018   Procedure: RIGHT STENT EXCHANGE;  Surgeon: Ardis Hughs, MD;  [facility];  Service: Urology;  Laterality: Right;  . CYSTOSCOPY WITH RETROGRADE PYELOGRAM, URETEROSCOPY AND  STENT PLACEMENT Left 08/25/2012   Procedure: CYSTOSCOPY WITH RETROGRADE PYELOGRAM, URETEROSCOPY;  Surgeon: Bernestine Amass, MD;  [facility];  Service: Urology;  Laterality: Left;  . CYSTOSCOPY WITH STENT PLACEMENT Left 01/15/2018   Procedure: LEFT STENT PLACEMENT;  Surgeon: Ardis Hughs, MD;  [facility];  Service: Urology;  Laterality: Left;  . CYSTOSCOPY WITH STENT PLACEMENT Right 01/22/2018   Procedure: RIGHT STENT REMOVAL;  Surgeon: Ardis Hughs, MD;  [facility];  Service: Urology;  Laterality: Right;  . CYSTOSCOPY/URETEROSCOPY/HOLMIUM LASER/STENT PLACEMENT Right 12/23/2017   Procedure: RIGHT URETEROSCOPY STONE REMOVAL, HOLMIUM LASER, RIGHT /STENT EXCHANGE;  Surgeon: Ardis Hughs, MD;  [facility];  Service: Urology;  Laterality: Right;  . CYSTOSCOPY/URETEROSCOPY/HOLMIUM LASER/STENT PLACEMENT Left 01/22/2018   Procedure: LEFT URETEROSCOPY STONE REMOVAL HOLMIUM LASER LEFT STENT Freddi Starr;  Surgeon: Ardis Hughs, MD;  [facility];  Service: Urology;  Laterality: Left;  . DILATION AND CURETTAGE OF UTERUS N/A 02/21/2013   Procedure: DILATATION AND CURETTAGE;  Surgeon: Lyman Speller, MD;  Location: [facility];  Service: Gynecology;  Laterality: N/A;  . DILATION AND CURETTAGE OF UTERUS N/A 04/09/2015   Procedure: [place] IUD removal;  Surgeon: Megan Salon, MD;  Location: [facility];  Service: Gynecology;  Laterality: N/A;  Patient weight 307lbs  . ESOPHAGOGASTRODUODENOSCOPY (EGD) WITH PROPOFOL N/A 02/02/2014   Procedure: ESOPHAGOGASTRODUODENOSCOPY (EGD) WITH PROPOFOL;  Surgeon: Irene Shipper, MD;  [facility];  Service: Endoscopy;  Laterality: N/A;  . GASTRIC BYPASS  1974  . HOLMIUM LASER APPLICATION Left 09/30/8467   Procedure: HOLMIUM LASER APPLICATION;  Surgeon: Bernestine Amass, MD;  [facility];  Service: Urology;  Laterality: Left;  . LITHOTRIPSY  03/2012  . LITHOTRIPSY  2/16  . THYROIDECTOMY   05/15/2010  . TONSILLECTOMY AND ADENOIDECTOMY    . URETEROSCOPY  03/03/2012   Procedure: URETEROSCOPY;  Surgeon: Bernestine Amass, MD;  [facility];  Service: Urology;  Laterality: Left;  . URETEROSCOPY WITH HOLMIUM LASER LITHOTRIPSY Bilateral 01/15/2018   Procedure: CYSTOSCOPY/BILATERAL URETEROSCOPY WITH HOLMIUM LASER LITHOTRIPSY STONE REMOVAL;  Surgeon: Ardis Hughs, MD;  [facility];  Service: Urology;  Laterality: Bilateral;    Family History  Problem Relation Age of Onset  . Hypertension Father   . Diabetes Father   . Lung cancer Father   . Heart attack Father        MI at age 14  . Hypertension Mother   . Hyperthyroidism Mother   . Heart disease Mother   . Heart attack Mother        MI at age 70  . Asthma Brother   . Hypertension Brother        younger  . Heart disease Brother        older  . Colon cancer Neg Hx   . Esophageal cancer Neg Hx   . Stomach cancer Neg Hx   . Kidney disease Neg Hx   . Liver disease Neg Hx     Allergies  Allergen Reactions  . Other Anaphylaxis    NUTS  . Penicillins Other (See Comments)    Headache. And hives  Has patient had a PCN reaction causing immediate rash, facial/tongue/throat swelling, SOB or lightheadedness with hypotension: No Has patient had a PCN reaction causing severe rash involving mucus membranes or skin necrosis: No Has patient had a PCN reaction that required hospitalization: No Has patient had a PCN reaction occurring within the last 10 years: Yes If all of the above answers are "NO", then may proceed with  Cephalosporin use.   [name] Amoxicillin-Pot Clavulanate Other (See Comments)    headache  . Ciprofloxacin Nausea Only and Rash  . Food Hives and Other (See Comments)    Potato  . Tomato Hives    Current Outpatient Medications on File Prior to Visit  Medication Sig Dispense Refill  . acetaminophen (TYLENOL) 325 MG tablet Take 2 tablets (650 mg total) by mouth every 6 (six) hours as needed for mild pain  or moderate pain. 60 tablet 0  . albuterol (PROVENTIL HFA;VENTOLIN HFA) 108 (90 Base) MCG/ACT inhaler Inhale 2 puffs into the lungs every 6 (six) hours as needed for wheezing or shortness of breath. 1 Inhaler 5  . albuterol (PROVENTIL) (2.5 MG/3ML) 0.083% nebulizer solution Take 3 mLs (2.5 mg total) by nebulization every 6 (six) hours as needed for wheezing or shortness of breath. 150 mL 1  . apixaban (ELIQUIS) 5 MG TABS tablet Take 5 mg by mouth 2 (two) times daily.    . calcitRIOL (ROCALTROL) 0.5 MCG capsule Take 2 mcg by mouth daily.     . calcium elemental as carbonate (TUMS ULTRA 1000) 400 MG chewable tablet Chew 2,000 mg by mouth 2 (two) times daily.    . citric acid-potassium citrate (POLYCITRA) 1100-334 MG/5ML solution Take 1 mL by mouth daily.     [name] desoximetasone (TOPICORT) 0.05 % cream Apply topically 2 (two) times daily as needed. (Patient taking differently: Apply 1 application topically 2 (two) times daily as needed (for rash). ) 60 g 0  . diclofenac sodium (VOLTAREN) 1 % GEL Apply 2 g topically 4 (four) times daily. 100 g 3  . diltiazem (CARDIZEM CD) 240 MG 24 hr capsule TAKE 1 CAPSULE BY MOUTH  DAILY (Patient taking differently: Take 240 mg by mouth daily. ) 90 capsule 1  . diphenhydrAMINE (BENADRYL) 25 mg capsule Take 25 mg by mouth every 6 (six) hours as needed for itching.     . diphenhydrAMINE-PE-APAP (RA SEVERE ALLERGY PLUS SINUS PO) Take 2 tablets by mouth daily as needed (for severe allergy).    [name] EPINEPHrine (EPIPEN 2-PAK) 0.3 mg/0.3 mL IJ SOAJ injection Inject 0.3 mg into the muscle once.    . fluocinonide (LIDEX) 0.05 % external solution Apply 1 application topically 2 (two) times daily as needed (for rash).    . fluticasone (VERAMYST) 27.5 MCG/SPRAY nasal spray Place 2 sprays into the nose daily.    . furosemide (LASIX) 40 MG tablet TAKE 1 tablet by mouth in the morning and 2 tabs in the evening 270 tablet 1  . hydrochlorothiazide (HYDRODIURIL) 25 MG tablet TAKE 1 TABLET BY  MOUTH  DAILY 90 tablet 1  . levocetirizine (XYZAL) 5 MG tablet TAKE 1 TABLET BY MOUTH  EVERY EVENING (Patient taking differently: Take 5 mg by mouth every evening. ) 90 tablet 3  . levothyroxine (SYNTHROID, LEVOTHROID) 100 MCG tablet Take 350 mcg by mouth daily before breakfast.     . loratadine (CLARITIN) 10 MG tablet Take 10 mg by mouth every morning.    . Melatonin 5 MG TABS Take 5 mg by mouth at bedtime as needed (for sleep).     . montelukast (SINGULAIR) 10 MG tablet Take 1 tablet (10 mg total) by mouth at bedtime. 30 tablet 3  . nystatin (MYCOSTATIN) 100000 UNIT/ML suspension TAKE 5 ML BY MOUTH FOUR TIMES DAILY 200 mL 0  . nystatin (MYCOSTATIN/NYSTOP) powder Apply topically 3 (three) times daily. 15 g 0  . nystatin cream (MYCOSTATIN) Apply 1 application topically 2 (  two) times daily. 30 g 2  . omeprazole (PRILOSEC) 40 MG capsule TAKE 1 CAPSULE BY MOUTH  DAILY (Patient taking differently: Take 40 mg by mouth daily. ) 90 capsule 1  . ondansetron (ZOFRAN) 4 MG tablet Take 1 tablet (4 mg total) by mouth every 8 (eight) hours as needed for nausea or vomiting. 30 tablet 0  . saccharomyces boulardii (FLORASTOR) 250 MG capsule Take 1 capsule (250 mg total) by mouth 2 (two) times daily. (Patient taking differently: Take 250 mg by mouth daily. ) 30 capsule 0  . SYMBICORT 160-4.5 MCG/ACT inhaler INHALE 2 PUFFS BY MOUTH TWO TIMES DAILY 30.6 g 1  . traMADol (ULTRAM) 50 MG tablet Take 1 tablet (50 mg total) by mouth every 12 (twelve) hours as needed. 15 tablet 0  . valACYclovir (VALTREX) 1000 MG tablet 2 tabs by mouth now and again in 12 hours 4 tablet 0  . Vitamin D, Ergocalciferol, (DRISDOL) 1.25 MG (50000 UT) CAPS capsule Take 1 capsule (50,000 Units total) by mouth every Monday, Wednesday, and Friday. 39 capsule 1  . zafirlukast (ACCOLATE) 20 MG tablet TAKE 1 TABLET BY MOUTH TWO  TIMES DAILY BEFORE MEALS (Patient taking differently: Take 20 mg by mouth 2 (two) times daily before a meal. ) 180 tablet 1    No current facility-administered medications on file prior to visit.     BP (!) 126/55 (BP Location: Right Arm, Patient Position: Sitting, Cuff Size: Large)   Pulse 81   Temp 97.6 F (36.4 C) (Oral)   Resp 18   Ht 5\' 4"  (1.626 m)   Wt 297 lb 12.8 oz (135.1 kg)   LMP 06/11/2012   SpO2 96%   BMI 51.12 kg/m    Objective:   Physical Exam Constitutional:      Appearance: She is well-developed.  Neck:     Musculoskeletal: Neck supple.     Thyroid: No thyromegaly.  Cardiovascular:     Rate and Rhythm: Normal rate and regular rhythm.     Heart sounds: Normal heart sounds. No murmur.  Pulmonary:     Effort: Pulmonary effort is normal. No respiratory distress.     Breath sounds: Normal breath sounds. No wheezing.  Musculoskeletal:     Right lower leg: Edema present.     Left lower leg: Edema present.  Skin:    General: Skin is warm and dry.  Neurological:     Mental Status: She is alert and oriented to person, place, and time.  Psychiatric:        Behavior: Behavior normal.        Thought Content: Thought content normal.        Judgment: Judgment normal.           Assessment & Plan:  HTN- bp stable despite not being on losartan.  Hypercalcemia- obtain follow up bmet.   Hx of kidney stones- following with urology. Monitor.  Allergic rhinitis- she feels like this is causing her mild nausea and suppressing her appetite. Not concerned about weight loss or nausea. Monitor for now. Discussed healthy diet.  b12 deficiency- wants to given self injection at home. Will plan teaching in office next visit with RN.  GERD- stable.  Monitor.

## 2018-08-04 NOTE — Patient Instructions (Signed)
Please complete lab work prior to leaving.   

## 2018-08-04 NOTE — Telephone Encounter (Signed)
Please advise pt potassium very low. Start Kdur 2 tabs by mouth now and 2 tabs at bedtime. Start one tab once daily tomorrow. Repeat bmet on Friday, dx hypokalemia.

## 2018-08-06 ENCOUNTER — Other Ambulatory Visit: Payer: 59

## 2018-08-09 ENCOUNTER — Other Ambulatory Visit: Payer: Self-pay

## 2018-08-09 ENCOUNTER — Ambulatory Visit: Payer: 59 | Admitting: Podiatry

## 2018-08-09 DIAGNOSIS — Z7901 Long term (current) use of anticoagulants: Secondary | ICD-10-CM | POA: Diagnosis not present

## 2018-08-09 DIAGNOSIS — Q828 Other specified congenital malformations of skin: Secondary | ICD-10-CM | POA: Diagnosis not present

## 2018-08-09 MED ORDER — TRAMADOL HCL 50 MG PO TABS: 50.0000 mg | ORAL_TABLET | Freq: Two times a day (BID) | ORAL | 0 refills | Status: DC | PRN

## 2018-08-09 NOTE — Progress Notes (Signed)
Subjective: 63 year old female presents the office today for concerns of painful calluses to her foot.  She also states that she had developed a blister on the left big toe but this did resolve on its own.  She has a spot on the right big toe from wearing shoes which is been keeping Neosporin on this area but not getting much better.  She denies any drainage or pus coming from the area she denies any surrounding redness or red streaks.  She still interested in having surgery, first IPJ arthrodesis however she wants to hold off until after March 4. Denies any systemic complaints such as fevers, chills, nausea, vomiting. No acute changes since last appointment, and no other complaints at this time.   Objective: AAO x3, NAD DP/PT pulses palpable bilaterally, CRT less than 3 seconds Hyperkeratotic lesion submetatarsal 1 as well as submetatarsal area in the right foot.  Upon debridement there is no underlying ulceration drainage or any signs of infection.  There was a old blister on the left hallux but this apparently resolved but there is some new skin present.  On the medial aspect the right hallux appears to be a dried blister.  Upon debridement this is underlying new skin intact.  Again there is no surrounding erythema, signs of infection. Hallux varus is unchanged No open lesions or pre-ulcerative lesions.  No pain with calf compression, swelling, warmth, erythema  Assessment: Symptomatic hyperkeratotic lesions  Plan: -All treatment options discussed with the patient including all alternatives, risks, complications.  -I debrided the hyperkeratotic lesion without any complications or bleeding.  I also debrided the area of the right hallux leading complications.  Recommend continue antibiotic ointment dressing changes daily. -We will plan for first MPJ arthrodesis right side in July -Patient encouraged to call the office with any questions, concerns, change in symptoms.   Trula Slade DPM

## 2018-08-13 ENCOUNTER — Other Ambulatory Visit: Payer: Self-pay | Admitting: Family

## 2018-08-17 ENCOUNTER — Other Ambulatory Visit: Payer: Self-pay

## 2018-08-17 ENCOUNTER — Other Ambulatory Visit (INDEPENDENT_AMBULATORY_CARE_PROVIDER_SITE_OTHER): Payer: 59

## 2018-08-17 ENCOUNTER — Other Ambulatory Visit: Payer: Self-pay | Admitting: Family

## 2018-08-17 ENCOUNTER — Telehealth: Payer: Self-pay | Admitting: Family

## 2018-08-17 ENCOUNTER — Ambulatory Visit (INDEPENDENT_AMBULATORY_CARE_PROVIDER_SITE_OTHER): Payer: 59

## 2018-08-17 DIAGNOSIS — E876 Hypokalemia: Secondary | ICD-10-CM

## 2018-08-17 DIAGNOSIS — E538 Deficiency of other specified B group vitamins: Secondary | ICD-10-CM

## 2018-08-17 LAB — BASIC METABOLIC PANEL
BUN: 23 mg/dL (ref 6–23)
CALCIUM: 9.4 mg/dL (ref 8.4–10.5)
CO2: 38 mEq/L — ABNORMAL HIGH (ref 19–32)
Chloride: 88 mEq/L — ABNORMAL LOW (ref 96–112)
Creatinine, Ser: 0.77 mg/dL (ref 0.40–1.20)
GFR: 76.14 mL/min (ref >60.00)
Glucose, Bld: 95 mg/dL (ref 70–99)
Potassium: 3.2 mEq/L — ABNORMAL LOW (ref 3.5–5.1)
Sodium: 136 mEq/L (ref 135–145)

## 2018-08-17 MED ORDER — CYANOCOBALAMIN 1000 MCG/ML IJ SOLN
1000.0000 ug | Freq: Once | INTRAMUSCULAR | Status: AC
  Administered 2018-08-17: 1000 ug via INTRAMUSCULAR

## 2018-08-17 NOTE — Progress Notes (Signed)
Noted and agree.  Nance Pear LPN

## 2018-08-17 NOTE — Progress Notes (Signed)
Pre visit review using our clinic tool,if applicable. No additional management support is needed unless otherwise documented below in the visit note.   Pt here for monthly B12 injection per order from M. Inda Castle NP.  B12 1073mcg given IM, and pt tolerated injection well. No complaints voiced.  Next B12 injection will be done at home by patient. Given instructions on how to draw up and inject B12. Return demonstration given by patient.  Advised patient to call office he she is having problems with injections. Patient agreed.

## 2018-08-17 NOTE — Telephone Encounter (Signed)
Please let pt know that Potassium is improving but still a little low.

## 2018-08-18 ENCOUNTER — Encounter: Payer: Self-pay | Admitting: Family

## 2018-08-24 ENCOUNTER — Other Ambulatory Visit: Payer: Self-pay | Admitting: Family

## 2018-09-02 ENCOUNTER — Telehealth: Payer: Self-pay | Admitting: Family

## 2018-09-02 ENCOUNTER — Other Ambulatory Visit (INDEPENDENT_AMBULATORY_CARE_PROVIDER_SITE_OTHER): Payer: 59

## 2018-09-02 ENCOUNTER — Other Ambulatory Visit: Payer: Self-pay

## 2018-09-02 DIAGNOSIS — E876 Hypokalemia: Secondary | ICD-10-CM | POA: Diagnosis not present

## 2018-09-02 LAB — BASIC METABOLIC PANEL
BUN: 25 mg/dL — ABNORMAL HIGH (ref 6–23)
CO2: 35 mEq/L — ABNORMAL HIGH (ref 19–32)
Calcium: 9.7 mg/dL (ref 8.4–10.5)
Chloride: 89 mEq/L — ABNORMAL LOW (ref 96–112)
Creatinine, Ser: 0.77 mg/dL (ref 0.40–1.20)
GFR: 76.13 mL/min (ref >60.00)
Glucose, Bld: 97 mg/dL (ref 70–99)
Potassium: 2.9 mEq/L — ABNORMAL LOW (ref 3.5–5.1)
Sodium: 137 mEq/L (ref 135–145)

## 2018-09-02 MED ORDER — POTASSIUM CHLORIDE 20 MEQ/15ML (10%) PO SOLN: 20.0000 meq | Freq: Every day | ORAL | 2 refills | Status: DC

## 2018-09-02 NOTE — Telephone Encounter (Signed)
Follow up potassium is 2.9.   Pt not taking kdur due to being unable to swallow. Will send rx for oral potassium liquid 20 meq once daily. Pt is advised to schedule a follow up lab appointment in 10 days.

## 2018-09-06 ENCOUNTER — Telehealth: Payer: Self-pay | Admitting: *Deleted

## 2018-09-06 ENCOUNTER — Encounter: Payer: Self-pay | Admitting: Family

## 2018-09-06 NOTE — Telephone Encounter (Signed)
Copied from [facility] [id number]. Topic: Appointment Scheduling - Scheduling Inquiry for Clinic >> Sep 02, 2018  4:19 PM Percell Belt A wrote: Reason for CRM: -pt called in and needs to sch a 10 day fu lab appt  Friday the 4/24 and a morning -  Best number [phone]

## 2018-09-06 NOTE — Telephone Encounter (Signed)
Spoke with pt and scheduled lab visit for 09/17/18 at 10am. Future order was already in place.

## 2018-09-08 ENCOUNTER — Other Ambulatory Visit: Payer: Self-pay | Admitting: Podiatry

## 2018-09-09 ENCOUNTER — Encounter: Payer: Self-pay | Admitting: Podiatry

## 2018-09-09 MED ORDER — TRAMADOL HCL 50 MG PO TABS: 50.0000 mg | ORAL_TABLET | Freq: Two times a day (BID) | ORAL | 0 refills | Status: DC | PRN

## 2018-09-09 NOTE — Telephone Encounter (Signed)
Pt states she is waiting for surgery and still working, has pain when in shoes and at work, is icing periodically. I told pt to continue the treatments she is using at home and I will inform Dr. Jacqualyn Posey of the tramadol request. Dr.Wagoner okayed refill of the tramadol. Left message [facility] for refill of Tramadol.

## 2018-09-16 ENCOUNTER — Other Ambulatory Visit: Payer: Self-pay | Admitting: Cardiology

## 2018-09-16 NOTE — Telephone Encounter (Signed)
63 yo, 135.1kg, Scr 0.77 on 09/02/18 Last OV 07/01/17 (will refill given COVID, message sent to scheduling to set up appt when able) Indication afib

## 2018-09-17 ENCOUNTER — Telehealth: Payer: Self-pay | Admitting: Family

## 2018-09-17 ENCOUNTER — Other Ambulatory Visit (INDEPENDENT_AMBULATORY_CARE_PROVIDER_SITE_OTHER): Payer: 59

## 2018-09-17 ENCOUNTER — Other Ambulatory Visit: Payer: Self-pay

## 2018-09-17 DIAGNOSIS — E876 Hypokalemia: Secondary | ICD-10-CM

## 2018-09-17 LAB — BASIC METABOLIC PANEL
BUN: 27 mg/dL — ABNORMAL HIGH (ref 6–23)
CO2: 33 mEq/L — ABNORMAL HIGH (ref 19–32)
Calcium: 8.8 mg/dL (ref 8.4–10.5)
Chloride: 90 mEq/L — ABNORMAL LOW (ref 96–112)
Creatinine, Ser: 0.71 mg/dL (ref 0.40–1.20)
GFR: 83.59 mL/min (ref >60.00)
Glucose, Bld: 97 mg/dL (ref 70–99)
Potassium: 3.2 mEq/L — ABNORMAL LOW (ref 3.5–5.1)
Sodium: 137 mEq/L (ref 135–145)

## 2018-09-17 NOTE — Telephone Encounter (Signed)
Advised pt to increase potassium liquid to bid for the next 2 days then return to once daily. Repeat bmet in 1 month.  She will either have drawn at her hematology appointment or schedule separately with [place].

## 2018-09-28 ENCOUNTER — Ambulatory Visit: Payer: 59 | Admitting: Podiatry

## 2018-10-07 ENCOUNTER — Other Ambulatory Visit: Payer: Self-pay

## 2018-10-07 ENCOUNTER — Ambulatory Visit: Payer: 59 | Admitting: Podiatry

## 2018-10-07 ENCOUNTER — Encounter: Payer: Self-pay | Admitting: Podiatry

## 2018-10-07 VITALS — Temp 97.9°F

## 2018-10-07 DIAGNOSIS — Q828 Other specified congenital malformations of skin: Secondary | ICD-10-CM | POA: Diagnosis not present

## 2018-10-07 DIAGNOSIS — L97501 Non-pressure chronic ulcer of other part of unspecified foot limited to breakdown of skin: Secondary | ICD-10-CM

## 2018-10-07 DIAGNOSIS — Z7901 Long term (current) use of anticoagulants: Secondary | ICD-10-CM | POA: Diagnosis not present

## 2018-10-08 ENCOUNTER — Other Ambulatory Visit (INDEPENDENT_AMBULATORY_CARE_PROVIDER_SITE_OTHER): Payer: 59

## 2018-10-08 ENCOUNTER — Other Ambulatory Visit: Payer: Self-pay

## 2018-10-08 DIAGNOSIS — E876 Hypokalemia: Secondary | ICD-10-CM | POA: Diagnosis not present

## 2018-10-08 NOTE — Addendum Note (Signed)
Addended by: Caffie Pinto on: 10/08/2018 08:38 AM   Modules accepted: Orders

## 2018-10-08 NOTE — Progress Notes (Signed)
Subjective: 63 year old female presents the office today for concerns of a new wound on top of her left big toe.  She states that this is been ongoing for last several weeks and started after I last saw her.  She denies any drainage or pus or any increasing redness or swelling.  She is been keeping antibiotic ointment on the area daily.  She also has painful calluses on the ball of her left foot that she had to have trimmed.  Denies any drainage or pus arthritis. Denies any systemic complaints such as fevers, chills, nausea, vomiting. No acute changes since last appointment, and no other complaints at this time.   Objective: AAO x3, NAD DP/PT pulses palpable bilaterally, CRT less than 3 seconds Overall the deformity of her feet are unchanged.  On the dorsal aspect the left hallux IPJ is a superficial granular wound measuring 0.3 x 0.3 cm.  There is no probing, undermining or tunneling.  No surrounding erythema, ascending cellulitis.  Is no fluctuation or crepitation or any malodor.  Thick hyperkeratotic lesion left foot submetatarsal 1.  Upon debridement no ongoing ulceration drainage or signs of infection but there was some dried blood present in the callus prior to debridement.  Excoriations are present along the distal leg no drainage or pus. No open lesions or pre-ulcerative lesions.  No pain with calf compression, swelling, warmth, erythema  Assessment: 63 year old female left hallux wound, symptomatic hyperkeratotic lesion  Plan: -All treatment options discussed with the patient including all alternatives, risks, complications.  -No clinical signs of infection of the wound.  Continue antibiotic ointment dressing changes.  Dispensed further offloading pads and showed her how to wrap the toe.  I also debrided the hyperkeratotic lesion left foot without any complications or bleeding.  Offloading.  Monitor for signs or signs of infection.  Antibiotic ointment also the excoriations of the  leg. -Monitor for any clinical signs or symptoms of infection and directed to call the office immediately should any occur or go to the ER.  Return in about 2 weeks (around 10/21/2018).  Trula Slade DPM

## 2018-10-09 LAB — BASIC METABOLIC PANEL
BUN/Creatinine Ratio: 33 (calc) — ABNORMAL HIGH (ref 6–22)
BUN: 26 mg/dL — ABNORMAL HIGH (ref 7–25)
CO2: 30 mmol/L (ref 20–32)
Calcium: 9.4 mg/dL (ref 8.6–10.4)
Chloride: 91 mmol/L — ABNORMAL LOW (ref 98–110)
Creat: 0.78 mg/dL (ref 0.50–0.99)
Glucose, Bld: 96 mg/dL (ref 65–99)
Potassium: 3.2 mmol/L — ABNORMAL LOW (ref 3.5–5.3)
Sodium: 136 mmol/L (ref 135–146)

## 2018-10-11 ENCOUNTER — Telehealth: Payer: Self-pay | Admitting: Family

## 2018-10-11 DIAGNOSIS — E876 Hypokalemia: Secondary | ICD-10-CM

## 2018-10-12 ENCOUNTER — Encounter: Payer: Self-pay | Admitting: Podiatry

## 2018-10-12 MED ORDER — POTASSIUM CHLORIDE 20 MEQ/15ML (10%) PO SOLN: 20.0000 meq | Freq: Two times a day (BID) | ORAL | 2 refills | Status: DC

## 2018-10-12 NOTE — Telephone Encounter (Signed)
K+ still low at 3.2. Pt taking 9meq once daily of potassium. Advised pt to increase to bid and schedule a lab visit in 1 week for repeat bmet. Pt verbalizes understanding.

## 2018-10-13 ENCOUNTER — Other Ambulatory Visit: Payer: Self-pay | Admitting: Podiatry

## 2018-10-13 MED ORDER — TRAMADOL HCL 50 MG PO TABS: ORAL_TABLET | ORAL | 0 refills | Status: DC

## 2018-10-20 ENCOUNTER — Emergency Department (HOSPITAL_COMMUNITY): Payer: 59

## 2018-10-20 ENCOUNTER — Other Ambulatory Visit: Payer: Self-pay

## 2018-10-20 ENCOUNTER — Inpatient Hospital Stay (HOSPITAL_COMMUNITY)
Admission: EM | Admit: 2018-10-20 | Discharge: 2018-10-24 | DRG: 871 | Disposition: A | Discharge status: Home Health | Payer: 59 | Attending: Internal Medicine | Admitting: Internal Medicine

## 2018-10-20 ENCOUNTER — Encounter (HOSPITAL_COMMUNITY): Payer: Self-pay | Admitting: *Deleted

## 2018-10-20 DIAGNOSIS — Z888 Allergy status to other drugs, medicaments and biological substances status: Secondary | ICD-10-CM

## 2018-10-20 DIAGNOSIS — N132 Hydronephrosis with renal and ureteral calculous obstruction: Secondary | ICD-10-CM | POA: Diagnosis present

## 2018-10-20 DIAGNOSIS — E876 Hypokalemia: Secondary | ICD-10-CM | POA: Diagnosis present

## 2018-10-20 DIAGNOSIS — J449 Chronic obstructive pulmonary disease, unspecified: Secondary | ICD-10-CM | POA: Diagnosis present

## 2018-10-20 DIAGNOSIS — A419 Sepsis, unspecified organism: Secondary | ICD-10-CM | POA: Diagnosis present

## 2018-10-20 DIAGNOSIS — A4151 Sepsis due to Escherichia coli [E. coli]: Secondary | ICD-10-CM | POA: Diagnosis present

## 2018-10-20 DIAGNOSIS — I4891 Unspecified atrial fibrillation: Secondary | ICD-10-CM | POA: Diagnosis not present

## 2018-10-20 DIAGNOSIS — I10 Essential (primary) hypertension: Secondary | ICD-10-CM | POA: Diagnosis not present

## 2018-10-20 DIAGNOSIS — Z8614 Personal history of Methicillin resistant Staphylococcus aureus infection: Secondary | ICD-10-CM

## 2018-10-20 DIAGNOSIS — Z20828 Contact with and (suspected) exposure to other viral communicable diseases: Secondary | ICD-10-CM | POA: Diagnosis present

## 2018-10-20 DIAGNOSIS — D500 Iron deficiency anemia secondary to blood loss (chronic): Secondary | ICD-10-CM

## 2018-10-20 DIAGNOSIS — D509 Iron deficiency anemia, unspecified: Secondary | ICD-10-CM | POA: Diagnosis present

## 2018-10-20 DIAGNOSIS — I5032 Chronic diastolic (congestive) heart failure: Secondary | ICD-10-CM | POA: Diagnosis present

## 2018-10-20 DIAGNOSIS — I480 Paroxysmal atrial fibrillation: Secondary | ICD-10-CM | POA: Diagnosis present

## 2018-10-20 DIAGNOSIS — M797 Fibromyalgia: Secondary | ICD-10-CM | POA: Diagnosis present

## 2018-10-20 DIAGNOSIS — Z87442 Personal history of urinary calculi: Secondary | ICD-10-CM | POA: Diagnosis not present

## 2018-10-20 DIAGNOSIS — I482 Chronic atrial fibrillation, unspecified: Secondary | ICD-10-CM | POA: Diagnosis present

## 2018-10-20 DIAGNOSIS — Z880 Allergy status to penicillin: Secondary | ICD-10-CM

## 2018-10-20 DIAGNOSIS — D72829 Elevated white blood cell count, unspecified: Secondary | ICD-10-CM | POA: Diagnosis not present

## 2018-10-20 DIAGNOSIS — Z833 Family history of diabetes mellitus: Secondary | ICD-10-CM

## 2018-10-20 DIAGNOSIS — E039 Hypothyroidism, unspecified: Secondary | ICD-10-CM | POA: Diagnosis not present

## 2018-10-20 DIAGNOSIS — Z9884 Bariatric surgery status: Secondary | ICD-10-CM

## 2018-10-20 DIAGNOSIS — W07XXXA Fall from chair, initial encounter: Secondary | ICD-10-CM | POA: Diagnosis present

## 2018-10-20 DIAGNOSIS — K760 Fatty (change of) liver, not elsewhere classified: Secondary | ICD-10-CM | POA: Diagnosis present

## 2018-10-20 DIAGNOSIS — Z8542 Personal history of malignant neoplasm of other parts of uterus: Secondary | ICD-10-CM

## 2018-10-20 DIAGNOSIS — K912 Postsurgical malabsorption, not elsewhere classified: Secondary | ICD-10-CM | POA: Diagnosis present

## 2018-10-20 DIAGNOSIS — Z91018 Allergy to other foods: Secondary | ICD-10-CM | POA: Diagnosis not present

## 2018-10-20 DIAGNOSIS — I110 Hypertensive heart disease with heart failure: Secondary | ICD-10-CM | POA: Diagnosis present

## 2018-10-20 DIAGNOSIS — R6521 Severe sepsis with septic shock: Secondary | ICD-10-CM | POA: Diagnosis present

## 2018-10-20 DIAGNOSIS — Z6841 Body Mass Index (BMI) 40.0 and over, adult: Secondary | ICD-10-CM

## 2018-10-20 DIAGNOSIS — Z7901 Long term (current) use of anticoagulants: Secondary | ICD-10-CM | POA: Diagnosis not present

## 2018-10-20 DIAGNOSIS — Z7951 Long term (current) use of inhaled steroids: Secondary | ICD-10-CM

## 2018-10-20 DIAGNOSIS — K219 Gastro-esophageal reflux disease without esophagitis: Secondary | ICD-10-CM | POA: Diagnosis present

## 2018-10-20 DIAGNOSIS — Z79899 Other long term (current) drug therapy: Secondary | ICD-10-CM

## 2018-10-20 DIAGNOSIS — I700 Atherosclerosis of aorta: Secondary | ICD-10-CM | POA: Diagnosis present

## 2018-10-20 DIAGNOSIS — Z825 Family history of asthma and other chronic lower respiratory diseases: Secondary | ICD-10-CM

## 2018-10-20 DIAGNOSIS — R5381 Other malaise: Secondary | ICD-10-CM | POA: Diagnosis present

## 2018-10-20 DIAGNOSIS — Z881 Allergy status to other antibiotic agents status: Secondary | ICD-10-CM

## 2018-10-20 DIAGNOSIS — J45909 Unspecified asthma, uncomplicated: Secondary | ICD-10-CM | POA: Diagnosis present

## 2018-10-20 DIAGNOSIS — Z801 Family history of malignant neoplasm of trachea, bronchus and lung: Secondary | ICD-10-CM

## 2018-10-20 DIAGNOSIS — Z89421 Acquired absence of other right toe(s): Secondary | ICD-10-CM

## 2018-10-20 DIAGNOSIS — N200 Calculus of kidney: Secondary | ICD-10-CM

## 2018-10-20 DIAGNOSIS — R7881 Bacteremia: Secondary | ICD-10-CM | POA: Diagnosis not present

## 2018-10-20 DIAGNOSIS — D649 Anemia, unspecified: Secondary | ICD-10-CM | POA: Diagnosis not present

## 2018-10-20 DIAGNOSIS — Z79890 Hormone replacement therapy (postmenopausal): Secondary | ICD-10-CM

## 2018-10-20 DIAGNOSIS — M1990 Unspecified osteoarthritis, unspecified site: Secondary | ICD-10-CM | POA: Diagnosis present

## 2018-10-20 DIAGNOSIS — Z66 Do not resuscitate: Secondary | ICD-10-CM | POA: Diagnosis present

## 2018-10-20 DIAGNOSIS — Z85850 Personal history of malignant neoplasm of thyroid: Secondary | ICD-10-CM

## 2018-10-20 DIAGNOSIS — Z87891 Personal history of nicotine dependence: Secondary | ICD-10-CM

## 2018-10-20 DIAGNOSIS — Z8249 Family history of ischemic heart disease and other diseases of the circulatory system: Secondary | ICD-10-CM

## 2018-10-20 LAB — URINALYSIS, ROUTINE W REFLEX MICROSCOPIC
Bilirubin Urine: NEGATIVE
Glucose, UA: NEGATIVE mg/dL
Ketones, ur: NEGATIVE mg/dL
Nitrite: NEGATIVE
Protein, ur: 30 mg/dL — AB
Specific Gravity, Urine: 1.045 — ABNORMAL HIGH (ref 1.005–1.030)
pH: 6 (ref 5.0–8.0)

## 2018-10-20 LAB — COMPREHENSIVE METABOLIC PANEL
ALT: 32 U/L (ref 0–44)
AST: 25 U/L (ref 15–41)
Albumin: 3.5 g/dL (ref 3.5–5.0)
Alkaline Phosphatase: 122 U/L (ref 38–126)
Anion gap: 16 — ABNORMAL HIGH (ref 5–15)
BUN: 21 mg/dL (ref 8–23)
CO2: 27 mmol/L (ref 22–32)
Calcium: 8.7 mg/dL — ABNORMAL LOW (ref 8.9–10.3)
Chloride: 90 mmol/L — ABNORMAL LOW (ref 98–111)
Creatinine, Ser: 1.12 mg/dL — ABNORMAL HIGH (ref 0.44–1.00)
GFR calc Af Amer: >60 mL/min (ref >60)
GFR calc non Af Amer: 53 mL/min — ABNORMAL LOW (ref >60)
Glucose, Bld: 133 mg/dL — ABNORMAL HIGH (ref 70–99)
Potassium: 2.6 mmol/L — CL (ref 3.5–5.1)
Sodium: 133 mmol/L — ABNORMAL LOW (ref 135–145)
Total Bilirubin: 1.5 mg/dL — ABNORMAL HIGH (ref 0.3–1.2)
Total Protein: 7.6 g/dL (ref 6.5–8.1)

## 2018-10-20 LAB — CBC
HCT: 22.3 % — ABNORMAL LOW (ref 36.0–46.0)
HCT: 31.5 % — ABNORMAL LOW (ref 36.0–46.0)
Hemoglobin: 6.7 g/dL — CL (ref 12.0–15.0)
Hemoglobin: 9.7 g/dL — ABNORMAL LOW (ref 12.0–15.0)
MCH: 26.5 pg (ref 26.0–34.0)
MCH: 26.7 pg (ref 26.0–34.0)
MCHC: 30 g/dL (ref 30.0–36.0)
MCHC: 30.8 g/dL (ref 30.0–36.0)
MCV: 88.1 fL (ref 80.0–100.0)
MCV: 86.8 fL (ref 80.0–100.0)
Platelets: 238 10*3/uL (ref 150–400)
Platelets: ADEQUATE 10*3/uL (ref 150–400)
RBC: 2.53 MIL/uL — ABNORMAL LOW (ref 3.87–5.11)
RBC: 3.63 MIL/uL — ABNORMAL LOW (ref 3.87–5.11)
RDW: 18.4 % — ABNORMAL HIGH (ref 11.5–15.5)
RDW: 17.2 % — ABNORMAL HIGH (ref 11.5–15.5)
WBC: 25.6 10*3/uL — ABNORMAL HIGH (ref 4.0–10.5)
WBC: 14.3 10*3/uL — ABNORMAL HIGH (ref 4.0–10.5)
nRBC: 0 % (ref 0.0–0.2)
nRBC: 0 % (ref 0.0–0.2)

## 2018-10-20 LAB — APTT: aPTT: 43 seconds — ABNORMAL HIGH (ref 24–36)

## 2018-10-20 LAB — CBG MONITORING, ED: Glucose-Capillary: 130 mg/dL — ABNORMAL HIGH (ref 70–99)

## 2018-10-20 LAB — SARS CORONAVIRUS 2 BY RT PCR (HOSPITAL ORDER, PERFORMED IN ~~LOC~~ HOSPITAL LAB): SARS Coronavirus 2: NEGATIVE

## 2018-10-20 LAB — TSH: TSH: 0.359 u[IU]/mL (ref 0.350–4.500)

## 2018-10-20 LAB — TROPONIN I: Troponin I: <0.03 ng/mL (ref <0.03)

## 2018-10-20 LAB — PREPARE RBC (CROSSMATCH)

## 2018-10-20 LAB — LIPASE, BLOOD: Lipase: 28 U/L (ref 11–51)

## 2018-10-20 LAB — HEPARIN LEVEL (UNFRACTIONATED): Heparin Unfractionated: <0.1 IU/mL — ABNORMAL LOW (ref 0.30–0.70)

## 2018-10-20 MED ORDER — DIPHENHYDRAMINE HCL 25 MG PO CAPS
25.0000 mg | ORAL_CAPSULE | Freq: Four times a day (QID) | ORAL | Status: DC | PRN
  Administered 2018-10-22 – 2018-10-23 (×4): 25 mg via ORAL
  Filled 2018-10-20 (×4): qty 1

## 2018-10-20 MED ORDER — ACETAMINOPHEN 325 MG PO TABS: 650.0000 mg | ORAL_TABLET | Freq: Four times a day (QID) | ORAL | Status: DC | PRN

## 2018-10-20 MED ORDER — VANCOMYCIN HCL IN DEXTROSE 1-5 GM/200ML-% IV SOLN
1000.0000 mg | Freq: Once | INTRAVENOUS | Status: AC
  Administered 2018-10-20: 1000 mg via INTRAVENOUS
  Filled 2018-10-20: qty 200

## 2018-10-20 MED ORDER — PANTOPRAZOLE SODIUM 40 MG PO TBEC
40.0000 mg | DELAYED_RELEASE_TABLET | Freq: Every day | ORAL | Status: DC
  Administered 2018-10-21 – 2018-10-24 (×4): 40 mg via ORAL
  Filled 2018-10-20 (×4): qty 1

## 2018-10-20 MED ORDER — SODIUM CHLORIDE 0.9 % IV SOLN: 10.0000 mL/h | Freq: Once | INTRAVENOUS | Status: DC

## 2018-10-20 MED ORDER — ONDANSETRON HCL 4 MG/2ML IJ SOLN
4.0000 mg | Freq: Once | INTRAMUSCULAR | Status: AC
  Administered 2018-10-20: 4 mg via INTRAVENOUS
  Filled 2018-10-20: qty 2

## 2018-10-20 MED ORDER — FLUTICASONE PROPIONATE 50 MCG/ACT NA SUSP
2.0000 | Freq: Every day | NASAL | Status: DC
  Administered 2018-10-21 – 2018-10-24 (×4): 2 via NASAL
  Filled 2018-10-20: qty 16

## 2018-10-20 MED ORDER — SODIUM CHLORIDE 0.9 % IV BOLUS
500.0000 mL | Freq: Once | INTRAVENOUS | Status: AC
  Administered 2018-10-20: 500 mL via INTRAVENOUS

## 2018-10-20 MED ORDER — ONDANSETRON HCL 4 MG PO TABS
4.0000 mg | ORAL_TABLET | Freq: Three times a day (TID) | ORAL | Status: DC | PRN
  Administered 2018-10-21: 4 mg via ORAL
  Filled 2018-10-20: qty 1

## 2018-10-20 MED ORDER — HEPARIN (PORCINE) 25000 UT/250ML-% IV SOLN
2100.0000 [IU]/h | INTRAVENOUS | Status: DC
  Administered 2018-10-20: 1200 [IU]/h via INTRAVENOUS
  Administered 2018-10-22: 2100 [IU]/h via INTRAVENOUS
  Filled 2018-10-20 (×3): qty 250

## 2018-10-20 MED ORDER — SODIUM CHLORIDE 0.9% FLUSH
3.0000 mL | Freq: Once | INTRAVENOUS | Status: AC
  Administered 2018-10-20: 3 mL via INTRAVENOUS

## 2018-10-20 MED ORDER — SODIUM CHLORIDE 0.9 % IV SOLN
INTRAVENOUS | Status: AC
  Administered 2018-10-20: 19:00:00 via INTRAVENOUS

## 2018-10-20 MED ORDER — MONTELUKAST SODIUM 10 MG PO TABS
10.0000 mg | ORAL_TABLET | Freq: Every day | ORAL | Status: DC
  Administered 2018-10-21 – 2018-10-23 (×4): 10 mg via ORAL
  Filled 2018-10-20 (×4): qty 1

## 2018-10-20 MED ORDER — POTASSIUM CHLORIDE 10 MEQ/100ML IV SOLN
10.0000 meq | Freq: Once | INTRAVENOUS | Status: AC
  Administered 2018-10-20: 10 meq via INTRAVENOUS
  Filled 2018-10-20: qty 100

## 2018-10-20 MED ORDER — LORATADINE 10 MG PO TABS
10.0000 mg | ORAL_TABLET | Freq: Every evening | ORAL | Status: DC
  Administered 2018-10-21 – 2018-10-23 (×4): 10 mg via ORAL
  Filled 2018-10-20 (×4): qty 1

## 2018-10-20 MED ORDER — ALBUTEROL SULFATE (2.5 MG/3ML) 0.083% IN NEBU: 2.5000 mg | INHALATION_SOLUTION | Freq: Four times a day (QID) | RESPIRATORY_TRACT | Status: DC | PRN

## 2018-10-20 MED ORDER — ACETAMINOPHEN 325 MG PO TABS
650.0000 mg | ORAL_TABLET | Freq: Four times a day (QID) | ORAL | Status: DC | PRN
  Administered 2018-10-20 – 2018-10-23 (×4): 650 mg via ORAL
  Filled 2018-10-20 (×4): qty 2

## 2018-10-20 MED ORDER — MOMETASONE FURO-FORMOTEROL FUM 200-5 MCG/ACT IN AERO
2.0000 | INHALATION_SPRAY | Freq: Two times a day (BID) | RESPIRATORY_TRACT | Status: DC
  Filled 2018-10-20: qty 8.8

## 2018-10-20 MED ORDER — VANCOMYCIN HCL 10 G IV SOLR
1250.0000 mg | INTRAVENOUS | Status: DC
  Filled 2018-10-20: qty 1250

## 2018-10-20 MED ORDER — IOHEXOL 300 MG/ML  SOLN
100.0000 mL | Freq: Once | INTRAMUSCULAR | Status: AC | PRN
  Administered 2018-10-20: 100 mL via INTRAVENOUS

## 2018-10-20 MED ORDER — MORPHINE SULFATE (PF) 4 MG/ML IV SOLN
4.0000 mg | Freq: Once | INTRAVENOUS | Status: AC
  Administered 2018-10-20: 4 mg via INTRAVENOUS
  Filled 2018-10-20: qty 1

## 2018-10-20 MED ORDER — DILTIAZEM HCL ER COATED BEADS 240 MG PO CP24
240.0000 mg | ORAL_CAPSULE | Freq: Every day | ORAL | Status: DC
  Administered 2018-10-21 – 2018-10-24 (×4): 240 mg via ORAL
  Filled 2018-10-20 (×4): qty 1

## 2018-10-20 MED ORDER — ALBUTEROL SULFATE (2.5 MG/3ML) 0.083% IN NEBU: 3.0000 mL | INHALATION_SOLUTION | Freq: Four times a day (QID) | RESPIRATORY_TRACT | Status: DC | PRN

## 2018-10-20 MED ORDER — TRAMADOL HCL 50 MG PO TABS: 50.0000 mg | ORAL_TABLET | Freq: Once | ORAL | Status: DC

## 2018-10-20 MED ORDER — OXYCODONE HCL 5 MG PO TABS
5.0000 mg | ORAL_TABLET | Freq: Four times a day (QID) | ORAL | Status: AC | PRN
  Administered 2018-10-20 – 2018-10-21 (×2): 5 mg via ORAL
  Filled 2018-10-20 (×2): qty 1

## 2018-10-20 MED ORDER — LORATADINE 10 MG PO TABS: 10.0000 mg | ORAL_TABLET | Freq: Every morning | ORAL | Status: DC

## 2018-10-20 MED ORDER — MELATONIN 3 MG PO TABS
6.0000 mg | ORAL_TABLET | Freq: Every evening | ORAL | Status: DC | PRN
  Administered 2018-10-20 – 2018-10-23 (×4): 6 mg via ORAL
  Filled 2018-10-20 (×7): qty 2

## 2018-10-20 MED ORDER — SODIUM CHLORIDE 0.9 % IV BOLUS
1000.0000 mL | Freq: Once | INTRAVENOUS | Status: AC
  Administered 2018-10-20: 1000 mL via INTRAVENOUS

## 2018-10-20 MED ORDER — NYSTATIN 100000 UNIT/GM EX CREA
1.0000 "application " | TOPICAL_CREAM | Freq: Two times a day (BID) | CUTANEOUS | Status: DC
  Administered 2018-10-21 – 2018-10-24 (×6): 1 via TOPICAL
  Filled 2018-10-20 (×2): qty 15

## 2018-10-20 MED ORDER — POLYETHYLENE GLYCOL 3350 17 G PO PACK
17.0000 g | PACK | Freq: Every day | ORAL | Status: DC | PRN
  Administered 2018-10-23: 17 g via ORAL
  Filled 2018-10-20: qty 1

## 2018-10-20 MED ORDER — SODIUM CHLORIDE 0.9 % IV SOLN
2.0000 g | Freq: Three times a day (TID) | INTRAVENOUS | Status: DC
  Administered 2018-10-21 (×2): 2 g via INTRAVENOUS
  Filled 2018-10-20 (×3): qty 2

## 2018-10-20 MED ORDER — SODIUM CHLORIDE 0.9 % IV SOLN
2.0000 g | Freq: Once | INTRAVENOUS | Status: AC
  Administered 2018-10-20: 2 g via INTRAVENOUS
  Filled 2018-10-20: qty 2

## 2018-10-20 MED ORDER — AZTREONAM 1 G IJ SOLR: 1.0000 g | Freq: Three times a day (TID) | INTRAMUSCULAR | Status: DC

## 2018-10-20 MED ORDER — LEVOTHYROXINE SODIUM 50 MCG PO TABS
350.0000 ug | ORAL_TABLET | Freq: Every day | ORAL | Status: DC
  Administered 2018-10-21 – 2018-10-23 (×3): 350 ug via ORAL
  Filled 2018-10-20 (×3): qty 1

## 2018-10-20 MED ORDER — TRAMADOL HCL 50 MG PO TABS
50.0000 mg | ORAL_TABLET | Freq: Two times a day (BID) | ORAL | Status: DC | PRN
  Administered 2018-10-20: 50 mg via ORAL
  Filled 2018-10-20 (×2): qty 1

## 2018-10-20 MED ORDER — ONDANSETRON HCL 4 MG/2ML IJ SOLN
INTRAMUSCULAR | Status: AC
  Filled 2018-10-20: qty 2

## 2018-10-20 NOTE — Progress Notes (Signed)
ANTICOAGULATION CONSULT NOTE - Initial Consult  Pharmacy Consult for Heparin Indication: atrial fibrillation  Allergies  Allergen Reactions  . Other Anaphylaxis    NUTS  . Penicillins Other (See Comments)    Headache. And hives  Has patient had a PCN reaction causing immediate rash, facial/tongue/throat swelling, SOB or lightheadedness with hypotension: No Has patient had a PCN reaction causing severe rash involving mucus membranes or skin necrosis: No Has patient had a PCN reaction that required hospitalization: No Has patient had a PCN reaction occurring within the last 10 years: Yes If all of the above answers are "NO", then may proceed with Cephalosporin use.   [name] Amoxicillin-Pot Clavulanate Other (See Comments)    headache  . Ciprofloxacin Nausea Only and Rash  . Food Hives and Other (See Comments)    Potato  . Tomato Hives    Patient Measurements: Height: 5\' 4"  (162.6 cm) Weight: 285 lb (129.3 kg) IBW/kg (Calculated) : 54.7 HEPARIN DW (KG): 86.6  Vital Signs: Temp: 99.2 F (37.3 C) (05/27 1630) Temp Source: Tympanic (05/27 1630) BP: 108/63 (05/27 1645) Pulse Rate: 95 (05/27 1645)  Labs: Recent Labs    10/20/18 1110  HGB 6.7*  HCT 22.3*  PLT 238  CREATININE 1.12*  TROPONINI <0.03    Estimated Creatinine Clearance: 70.4 mL/min (A) (by C-G formula based on SCr of 1.12 mg/dL (H)).   Medical History: Past Medical History:  Diagnosis Date  . Abnormal Pap smear    years ago/no biopsy  . Allergic rhinitis   . Anemia, iron deficiency    iron infusion scheduled for 04/02/2018  . Aortic atherosclerosis ([place])   . Arthritis    back- lower  . Asthma   . Atrial fibrillation ([place]) August 2012   OFF XARELTO LAST MONTH DUE TO BLEEDING IN STOOL  . Bursitis of hip left  . Chronic diastolic (congestive) heart failure (HCC)    pt unaware of this  . COPD (chronic obstructive pulmonary disease) with chronic bronchitis ([place])   . Decreased pulses in feet   .  Dysrhythmia    afib, followed by Dr. Stanford Breed   . Edema of both legs   . Esophagitis 02/02/2014   Distal, linear erosions, noted on endoscopy  . Fatty liver 01/07/2012  . Fibroid 1974   fibroid cyst on left fallopian tube  . Fibromyalgia   . Foot ulcer ([place])    AREA HEALED RIGHT FOOT  . GERD (gastroesophageal reflux disease)   . Headache(784.0)    occasional sinus headache   . Hepatomegaly   . History of blood transfusion JULY 2015  . History of cardiomegaly 12/06/2010   Noted on CT  . History of E. coli septicemia   . Hypertension   . Hypoparathyroidism ([place]) 01/07/2011  . Hypothyroidism   . Kidney stone 01-Oct-2015   passed on their own  . Morbid obesity ([place])   . MRSA infection   . Nephrolithiasis 2/16, 9/16   SEES DR Risa Grill  . Pernicious anemia 02/20/2014   followed by Debbrah Alar  . Pneumonia   . PONV (postoperative nausea and vomiting)   . Rash    thighs and back  . Seizures ([place])    infancy secondary to fever  . Staph aureus infection   . Thoracic spondylosis 12/06/2010   Noted on CT  . Thyroid cancer ([place]) 2011   THYROIDECTOMY DONE  . Uterine cancer ([place]) 2014   Mirena IUD  . Vitamin B 12 deficiency   . Vitamin D deficiency  Assessment: Pt is a 62yoF with a history of pAF. She is on Eliquis PTA, reports last dose 5/26 PM. Apixaban will interfere with heparin levels, will monitor heparin based on aPTT until levels correlate.    Goal of Therapy:  Heparin level 0.3-0.7 units/ml  APTT 66-102 sec Monitor platelets by anticoagulation protocol: Yes   Plan:  Will not give bolus given recent Eliquis use. Start heparin infusion at 1200 units/hr  Check aPTT level in 6 hours and aPTT/HL daily while on heparin Continue to monitor H&H and platelets   Claiborne Billings, PharmD PGY2 Cardiology Pharmacy Resident Please check AMION for all Pharmacist numbers by unit 10/20/2018 5:34 PM

## 2018-10-20 NOTE — ED Notes (Addendum)
MD notified of potassium of 2.6

## 2018-10-20 NOTE — ED Notes (Signed)
Nurse navigator spoke with patient and brother updated plan and brother also gave his daughter's name and phone number. [name] [phone] to contact if unable to contact him since he is going to the dentist.

## 2018-10-20 NOTE — ED Notes (Signed)
ED TO INPATIENT HANDOFF REPORT  ED Nurse Name and Phone #: [phone] Varney Baas Name/Age/Gender [name] 63 y.o. female Room/Bed: TRACC/TRACC  Code Status   Code Status: Prior  Home/SNF/Other Home Patient oriented to: self, place, time and situation Is this baseline? Yes   Triage Complete: Triage complete  Chief Complaint weakness  Triage Note Pt states generalized weakness x 3 days and diarrhea starting  This am.  Pt slid off of chair and has skin tear  To L arm.  Denies loc or hitting head.    CBG 155 121/60 bp 100 hr 99% ra 98.1 temp 20 G L fa.  PT states generalized weakness x 3 days, vomiting last night and diarrhea starting this am.  She was feeling weak after getting out of shower and slid out of chair.  Denies loc.  AO x 4, presently.   Allergies Allergies  Allergen Reactions  . Other Anaphylaxis    NUTS  . Penicillins Other (See Comments)    Headache. And hives  Has patient had a PCN reaction causing immediate rash, facial/tongue/throat swelling, SOB or lightheadedness with hypotension: No Has patient had a PCN reaction causing severe rash involving mucus membranes or skin necrosis: No Has patient had a PCN reaction that required hospitalization: No Has patient had a PCN reaction occurring within the last 10 years: Yes If all of the above answers are "NO", then may proceed with Cephalosporin use.   [name] Amoxicillin-Pot Clavulanate Other (See Comments)    headache  . Ciprofloxacin Nausea Only and Rash  . Food Hives and Other (See Comments)    Potato  . Tomato Hives    Level of Care/Admitting Diagnosis ED Disposition    ED Disposition Condition [facility] Area: [place] [[id number]]  Level of Care: Telemetry Cardiac [103]  Covid Evaluation: N/A  Diagnosis: Sepsis [facility]) [[id number]]  Admitting Physician: Desiree Hane [[id number]]  Attending Physician: Desiree Hane [phone]  Estimated length of stay: past midnight  tomorrow  Certification:: I certify this patient will need inpatient services for at least 2 midnights  PT Class (Do Not Modify): Inpatient [101]  PT Acc Code (Do Not Modify): Private [1]       B Medical/Surgery History Past Medical History:  Diagnosis Date  . Abnormal Pap smear    years ago/no biopsy  . Allergic rhinitis   . Anemia, iron deficiency    iron infusion scheduled for 04/02/2018  . Aortic atherosclerosis ([place])   . Arthritis    back- lower  . Asthma   . Atrial fibrillation ([place]) August 2012   OFF XARELTO LAST MONTH DUE TO BLEEDING IN STOOL  . Bursitis of hip left  . Chronic diastolic (congestive) heart failure (HCC)    pt unaware of this  . COPD (chronic obstructive pulmonary disease) with chronic bronchitis ([place])   . Decreased pulses in feet   . Dysrhythmia    afib, followed by Dr. Stanford Breed   . Edema of both legs   . Esophagitis 02/02/2014   Distal, linear erosions, noted on endoscopy  . Fatty liver 01/07/2012  . Fibroid 1974   fibroid cyst on left fallopian tube  . Fibromyalgia   . Foot ulcer ([place])    AREA HEALED RIGHT FOOT  . GERD (gastroesophageal reflux disease)   . Headache(784.0)    occasional sinus headache   . Hepatomegaly   . History of blood transfusion JULY 2015  . History of cardiomegaly 12/06/2010  Noted on CT  . History of E. coli septicemia   . Hypertension   . Hypoparathyroidism ([place]) 01/07/2011  . Hypothyroidism   . Kidney stone 2015-10-03   passed on their own  . Morbid obesity ([place])   . MRSA infection   . Nephrolithiasis 2/16, 9/16   SEES DR Risa Grill  . Pernicious anemia 02/20/2014   followed by Debbrah Alar  . Pneumonia   . PONV (postoperative nausea and vomiting)   . Rash    thighs and back  . Seizures ([place])    infancy secondary to fever  . Staph aureus infection   . Thoracic spondylosis 12/06/2010   Noted on CT  . Thyroid cancer ([place]) 2011   THYROIDECTOMY DONE  . Uterine cancer ([place]) 2014   Mirena IUD  . Vitamin B  12 deficiency   . Vitamin D deficiency    Past Surgical History:  Procedure Laterality Date  . AMPUTATION Right 05/18/2014   Procedure: RIGHT FIFTH RAY AMPUTATION FOOT;  Surgeon: Wylene Simmer, MD;  Location: [facility];  Service: Orthopedics;  Laterality: Right;  . APPENDECTOMY    . BUNIONECTOMY     bilateral  . COLONOSCOPY WITH PROPOFOL N/A 02/02/2014   Procedure: COLONOSCOPY WITH PROPOFOL;  Surgeon: Irene Shipper, MD;  [facility];  Service: Endoscopy;  Laterality: N/A;  . cyst on ovary removed     . CYSTOSCOPY W/ RETROGRADES  03/03/2012   Procedure: CYSTOSCOPY WITH RETROGRADE PYELOGRAM;  Surgeon: Bernestine Amass, MD;  [facility];  Service: Urology;  Laterality: Bilateral;  . CYSTOSCOPY W/ URETERAL STENT PLACEMENT Right 11/25/2017   Procedure: CYSTOSCOPY WITH RETROGRADE RIGHT URETERAL STENT PLACEMENT;  Surgeon: Franchot Gallo, MD;  Location: [facility];  Service: Urology;  Laterality: Right;  . CYSTOSCOPY W/ URETERAL STENT PLACEMENT Right 01/15/2018   Procedure: RIGHT STENT EXCHANGE;  Surgeon: Ardis Hughs, MD;  [facility];  Service: Urology;  Laterality: Right;  . CYSTOSCOPY WITH RETROGRADE PYELOGRAM, URETEROSCOPY AND STENT PLACEMENT Left 08/25/2012   Procedure: CYSTOSCOPY WITH RETROGRADE PYELOGRAM, URETEROSCOPY;  Surgeon: Bernestine Amass, MD;  [facility];  Service: Urology;  Laterality: Left;  . CYSTOSCOPY WITH STENT PLACEMENT Left 01/15/2018   Procedure: LEFT STENT PLACEMENT;  Surgeon: Ardis Hughs, MD;  [facility];  Service: Urology;  Laterality: Left;  . CYSTOSCOPY WITH STENT PLACEMENT Right 01/22/2018   Procedure: RIGHT STENT REMOVAL;  Surgeon: Ardis Hughs, MD;  [facility];  Service: Urology;  Laterality: Right;  . CYSTOSCOPY/URETEROSCOPY/HOLMIUM LASER/STENT PLACEMENT Right 12/23/2017   Procedure: RIGHT URETEROSCOPY STONE REMOVAL, HOLMIUM LASER, RIGHT /STENT EXCHANGE;  Surgeon: Ardis Hughs, MD;  [facility];  Service: Urology;   Laterality: Right;  . CYSTOSCOPY/URETEROSCOPY/HOLMIUM LASER/STENT PLACEMENT Left 01/22/2018   Procedure: LEFT URETEROSCOPY STONE REMOVAL HOLMIUM LASER LEFT STENT Freddi Starr;  Surgeon: Ardis Hughs, MD;  [facility];  Service: Urology;  Laterality: Left;  . DILATION AND CURETTAGE OF UTERUS N/A 02/21/2013   Procedure: DILATATION AND CURETTAGE;  Surgeon: Lyman Speller, MD;  Location: [facility];  Service: Gynecology;  Laterality: N/A;  . DILATION AND CURETTAGE OF UTERUS N/A 04/09/2015   Procedure: [place] IUD removal;  Surgeon: Megan Salon, MD;  Location: [facility];  Service: Gynecology;  Laterality: N/A;  Patient weight 307lbs  . ESOPHAGOGASTRODUODENOSCOPY (EGD) WITH PROPOFOL N/A 02/02/2014   Procedure: ESOPHAGOGASTRODUODENOSCOPY (EGD) WITH PROPOFOL;  Surgeon: Irene Shipper, MD;  [facility];  Service: Endoscopy;  Laterality: N/A;  .  GASTRIC BYPASS  1974  . HOLMIUM LASER APPLICATION Left 11/01/6293   Procedure: HOLMIUM LASER APPLICATION;  Surgeon: Bernestine Amass, MD;  [facility];  Service: Urology;  Laterality: Left;  . LITHOTRIPSY  03/2012  . LITHOTRIPSY  2/16  . THYROIDECTOMY  05/15/2010  . TONSILLECTOMY AND ADENOIDECTOMY    . URETEROSCOPY  03/03/2012   Procedure: URETEROSCOPY;  Surgeon: Bernestine Amass, MD;  [facility];  Service: Urology;  Laterality: Left;  . URETEROSCOPY WITH HOLMIUM LASER LITHOTRIPSY Bilateral 01/15/2018   Procedure: CYSTOSCOPY/BILATERAL URETEROSCOPY WITH HOLMIUM LASER LITHOTRIPSY STONE REMOVAL;  Surgeon: Ardis Hughs, MD;  [facility];  Service: Urology;  Laterality: Bilateral;     A IV Location/Drains/Wounds Patient Lines/Drains/Airways Status   Active Line/Drains/Airways    Name:   Placement date:   Placement time:   Site:   Days:   Peripheral IV 10/20/18 Left Hand   10/20/18    1124    Hand   less than 1   Peripheral IV 10/20/18 Left Hand   10/20/18    1125    Hand   less than 1    Ureteral Drain/Stent Left ureter 6 Fr.   01/22/18    0937    Left ureter   271   External Urinary Catheter   10/20/18    1212    -   less than 1          Intake/Output Last 24 hours  Intake/Output Summary (Last 24 hours) at 10/20/2018 1655 Last data filed at 10/20/2018 1616 Gross per 24 hour  Intake 50 ml  Output -  Net 50 ml    Labs/Imaging Results for orders placed or performed during the hospital encounter of 10/20/18 (from the past 48 hour(s))  CBG monitoring, ED     Status: Abnormal   Collection Time: 10/20/18 10:53 AM  Result Value Ref Range   Glucose-Capillary 130 (H) 70 - 99 mg/dL  CBC     Status: Abnormal   Collection Time: 10/20/18 11:10 AM  Result Value Ref Range   WBC 25.6 (H) 4.0 - 10.5 K/uL   RBC 2.53 (L) 3.87 - 5.11 MIL/uL   Hemoglobin 6.7 (LL) 12.0 - 15.0 g/dL    Comment: REPEATED TO VERIFY THIS CRITICAL RESULT HAS VERIFIED AND BEEN CALLED TO MICHAELSON,B RN BY AMANDA LEONARD ON 05 27 2020 AT 2841, AND HAS BEEN READ BACK.     HCT 22.3 (L) 36.0 - 46.0 %   MCV 88.1 80.0 - 100.0 fL   MCH 26.5 26.0 - 34.0 pg   MCHC 30.0 30.0 - 36.0 g/dL   RDW 18.4 (H) 11.5 - 15.5 %   Platelets 238 150 - 400 K/uL   nRBC 0.0 0.0 - 0.2 %    Comment: Performed at [facility] [address], [place], [place] [address]  Comprehensive metabolic panel     Status: Abnormal   Collection Time: 10/20/18 11:10 AM  Result Value Ref Range   Sodium 133 (L) 135 - 145 mmol/L   Potassium 2.6 (LL) 3.5 - 5.1 mmol/L    Comment: CRITICAL RESULT CALLED TO, READ BACK BY AND VERIFIED WITH: J.BLUE,RN 1305 10/20/2018 CLARK,S    Chloride 90 (L) 98 - 111 mmol/L   CO2 27 22 - 32 mmol/L   Glucose, Bld 133 (H) 70 - 99 mg/dL   BUN 21 8 - 23 mg/dL   Creatinine, Ser 1.12 (H) 0.44 - 1.00 mg/dL   Calcium 8.7 (L) 8.9 -  10.3 mg/dL   Total Protein 7.6 6.5 - 8.1 g/dL   Albumin 3.5 3.5 - 5.0 g/dL   AST 25 15 - 41 U/L   ALT 32 0 - 44 U/L   Alkaline Phosphatase 122 38 - 126 U/L   Total Bilirubin 1.5  (H) 0.3 - 1.2 mg/dL   GFR calc non Af Amer 53 (L) >60 mL/min   GFR calc Af Amer >60 >60 mL/min   Anion gap 16 (H) 5 - 15    Comment: Performed at [facility] [address], [place], [place] [address]  Troponin I - Once     Status: None   Collection Time: 10/20/18 11:10 AM  Result Value Ref Range   Troponin I <0.03 <0.03 ng/mL    Comment: Performed at [facility] [address] [place], [place] [address]  Lipase, blood     Status: None   Collection Time: 10/20/18 11:10 AM  Result Value Ref Range   Lipase 28 11 - 51 U/L    Comment: Performed at [facility] [address], [place], [place] [address]  Type and screen     Status: None (Preliminary result)   Collection Time: 10/20/18 12:55 PM  Result Value Ref Range   ABO/RH(D) O POS    Antibody Screen NEG    Sample Expiration 10/23/2018,2359    Unit Number [id number]    Blood Component Type RED CELLS,LR    Unit division 00    Status of Unit ISSUED    Transfusion Status OK TO TRANSFUSE    Crossmatch Result      Compatible Performed at [facility], [place] [address], [place], [place] [address]   Prepare RBC     Status: None   Collection Time: 10/20/18 12:55 PM  Result Value Ref Range   Order Confirmation      ORDER PROCESSED BY BLOOD BANK Performed at [facility], [place] [address] [place], [place] [address]   SARS Coronavirus 2 (CEPHEID - Performed in [facility] hospital lab), Hosp Order     Status: None   Collection Time: 10/20/18  1:28 PM  Result Value Ref Range   SARS Coronavirus 2 NEGATIVE NEGATIVE    Comment: (NOTE) If result is NEGATIVE SARS-CoV-2 target nucleic acids are NOT DETECTED. The SARS-CoV-2 RNA is generally detectable in upper and lower  respiratory specimens during the acute phase of infection. The lowest  concentration of SARS-CoV-2 viral copies this assay can detect is 250  copies / mL. A negative result does not preclude SARS-CoV-2 infection  and should not be used  as the sole basis for treatment or other  patient management decisions.  A negative result may occur with  improper specimen collection / handling, submission of specimen other  than nasopharyngeal swab, presence of viral mutation(s) within the  areas targeted by this assay, and inadequate number of viral copies  (<250 copies / mL). A negative result must be combined with clinical  observations, patient history, and epidemiological information. If result is POSITIVE SARS-CoV-2 target nucleic acids are DETECTED. The SARS-CoV-2 RNA is generally detectable in upper and lower  respiratory specimens dur ing the acute phase of infection.  Positive  results are indicative of active infection with SARS-CoV-2.  Clinical  correlation with patient history and other diagnostic information is  necessary to determine patient infection status.  Positive results do  not rule out bacterial infection or co-infection with other viruses. If result  is PRESUMPTIVE POSTIVE SARS-CoV-2 nucleic acids MAY BE PRESENT.   A presumptive positive result was obtained on the submitted specimen  and confirmed on repeat testing.  While 2019 novel coronavirus  (SARS-CoV-2) nucleic acids may be present in the submitted sample  additional confirmatory testing may be necessary for epidemiological  and / or clinical management purposes  to differentiate between  SARS-CoV-2 and other Sarbecovirus currently known to infect humans.  If clinically indicated additional testing with an alternate test  methodology [phone]) is advised. The SARS-CoV-2 RNA is generally  detectable in upper and lower respiratory sp ecimens during the acute  phase of infection. The expected result is Negative. Fact Sheet for Patients:  StrictlyIdeas.no Fact Sheet for Healthcare Providers: BankingDealers.co.za This test is not yet approved or cleared by the [place] FDA and has been authorized for  detection and/or diagnosis of SARS-CoV-2 by FDA under an Emergency Use Authorization (EUA).  This EUA will remain in effect (meaning this test can be used) for the duration of the COVID-19 declaration under Section 564(b)(1) of the Act, 21 U.S.C. section 360bbb-3(b)(1), unless the authorization is terminated or revoked sooner. Performed at [facility], [place] [address] [place], [place] [address]   Urinalysis, Routine w reflex microscopic     Status: Abnormal   Collection Time: 10/20/18  4:14 PM  Result Value Ref Range   Color, Urine YELLOW YELLOW   APPearance CLOUDY (A) CLEAR   Specific Gravity, Urine 1.045 (H) 1.005 - 1.030   pH 6.0 5.0 - 8.0   Glucose, UA NEGATIVE NEGATIVE mg/dL   Hgb urine dipstick MODERATE (A) NEGATIVE   Bilirubin Urine NEGATIVE NEGATIVE   Ketones, ur NEGATIVE NEGATIVE mg/dL   Protein, ur 30 (A) NEGATIVE mg/dL   Nitrite NEGATIVE NEGATIVE   Leukocytes,Ua LARGE (A) NEGATIVE   RBC / HPF 0-5 0 - 5 RBC/hpf   WBC, UA 21-50 0 - 5 WBC/hpf   Bacteria, UA FEW (A) NONE SEEN   Squamous Epithelial / LPF 0-5 0 - 5   WBC Clumps PRESENT     Comment: Performed at [facility], [place] [address], [place], [place] [address]   *Note: Due to a large number of results and/or encounters for the requested time period, some results have not been displayed. A complete set of results can be found in Results Review.   Dg Chest 1 View  Result Date: 10/20/2018 CLINICAL DATA:  Weakness EXAM: CHEST  1 VIEW COMPARISON:  06/30/2018 FINDINGS: Heart and mediastinal contours are within normal limits. No focal opacities or effusions. No acute bony abnormality. IMPRESSION: No active disease. Electronically Signed   By: Rolm Baptise M.D.   On: 10/20/2018 11:38   Ct Abdomen Pelvis W Contrast  Result Date: 10/20/2018 CLINICAL DATA:  Generalized weakness, vomiting, diarrhea EXAM: CT ABDOMEN AND PELVIS WITH CONTRAST TECHNIQUE: Multidetector CT imaging of the abdomen and pelvis was performed  using the standard protocol following bolus administration of intravenous contrast. CONTRAST:  171mL OMNIPAQUE IOHEXOL 300 MG/ML  SOLN COMPARISON:  03/26/2018 FINDINGS: Lower chest: No acute abnormality. Hepatobiliary: No focal liver abnormality is seen. Hepatic steatosis. No gallstones, gallbladder wall thickening, or biliary dilatation. Pancreas: Unremarkable. No pancreatic ductal dilatation or surrounding inflammatory changes. Spleen: Normal in size without focal abnormality. Adrenals/Urinary Tract: Adrenal glands are unremarkable. Multiple bilateral nonobstructive renal calculi. There is a 2 mm calculus in the dependent urinary bladder (series 3, image 85). There is mild right hydronephrosis and hydroureter with stranding about the right  kidney. Stomach/Bowel: Stomach is within normal limits. Appendix appears normal. No evidence of bowel wall thickening, distention, or inflammatory changes. There is extensive small bowel and colonic diverticulosis. Vascular/Lymphatic: Calcific atherosclerosis. No enlarged abdominal or pelvic lymph nodes. Reproductive: No mass or other abnormality. IUD present in the endometrial cavity. Other: No abdominal wall hernia or abnormality. No abdominopelvic ascites. Musculoskeletal: No acute or significant osseous findings. Disc degenerative disease and ankylosis of the spine IMPRESSION: 1. Multiple bilateral nonobstructive renal calculi. There is a 2 mm calculus in the dependent urinary bladder (series 3, image 85). There is mild right hydronephrosis and hydroureter with stranding about the right kidney. Findings are consistent with a recently passed right-sided ureteral calculus. 2.  Chronic and incidental findings as detailed above. Electronically Signed   By: Eddie Candle M.D.   On: 10/20/2018 14:49    Pending Labs Unresulted Labs (From admission, onward)    Start     Ordered   10/20/18 1236  Blood culture (routine x 2)  BLOOD CULTURE X 2,   STAT     10/20/18 1235           Vitals/Pain Today's Vitals   10/20/18 1530 10/20/18 1530 10/20/18 1545 10/20/18 1613  BP: (!) 99/57 (!) 99/57 (!) 108/49   Pulse:  86 87   Resp: (!) 24 16 (!) 21   Temp:  99.6 F (37.6 C)  98.6 F (37 C)  TempSrc:  Oral  Oral  SpO2:  95% 95%   Weight:      Height:      PainSc:        Isolation Precautions No active isolations  Medications Medications  0.9 %  sodium chloride infusion (has no administration in time range)  potassium chloride 10 mEq in 100 mL IVPB (has no administration in time range)  vancomycin (VANCOCIN) IVPB 1000 mg/200 mL premix (1,000 mg Intravenous New Bag/Given 10/20/18 1623)  sodium chloride flush (NS) 0.9 % injection 3 mL (3 mLs Intravenous Given 10/20/18 1153)  sodium chloride 0.9 % bolus 1,000 mL (1,000 mLs Intravenous New Bag/Given 10/20/18 1153)  ondansetron (ZOFRAN) injection 4 mg (4 mg Intravenous Given 10/20/18 1153)  morphine 4 MG/ML injection 4 mg (4 mg Intravenous Given 10/20/18 1322)  ceFEPIme (MAXIPIME) 2 g in sodium chloride 0.9 % 100 mL IVPB (0 g Intravenous Stopped 10/20/18 1545)  iohexol (OMNIPAQUE) 300 MG/ML solution 100 mL (100 mLs Intravenous Contrast Given 10/20/18 1437)    Mobility walks with device High fall risk   Focused Assessments gastrointestinal   R Recommendations: See Admitting Provider Note  Report given to:   Additional Notes:

## 2018-10-20 NOTE — ED Notes (Addendum)
Pt c/o lower back pain.  States it is chronic and did not occur after fall.  MD aware and assessed.

## 2018-10-20 NOTE — Progress Notes (Signed)
Patient have fever of 101.2 after blood transfusion, MD notified, 2 tylenol given per MD order. Report given to night Nurse. Will continue to monitor the patient.

## 2018-10-20 NOTE — ED Provider Notes (Signed)
[facility] EMERGENCY DEPARTMENT Provider Note   CSN: [id number] Arrival date & time: 10/20/18  1042    History   Chief Complaint Chief Complaint  Patient presents with  . Weakness  . Diarrhea    HPI [name] is a 63 y.o. female.     Patient is a 63 year old female with past medical history of COPD, paroxysmal A. fib, CHF, morbid obesity.  She presents today for evaluation of weakness.  Patient states that she has had vomiting yesterday evening and diarrhea this morning.  She began to get weak as she was walking out of the shower and slid out of her chair onto the floor.  She denies being injured during this.  Patient states that she feels "just like she did when she had an infection last year".  The history is provided by the patient.  Weakness  Severity:  Moderate Onset quality:  Gradual Duration:  3 days Timing:  Constant Progression:  Worsening Chronicity:  New Relieved by:  Nothing Worsened by:  Nothing Ineffective treatments:  None tried Associated symptoms: diarrhea   Diarrhea    Past Medical History:  Diagnosis Date  . Abnormal Pap smear    years ago/no biopsy  . Allergic rhinitis   . Anemia, iron deficiency    iron infusion scheduled for 04/02/2018  . Aortic atherosclerosis ([place])   . Arthritis    back- lower  . Asthma   . Atrial fibrillation ([place]) August 2012   OFF XARELTO LAST MONTH DUE TO BLEEDING IN STOOL  . Bursitis of hip left  . Chronic diastolic (congestive) heart failure (HCC)    pt unaware of this  . COPD (chronic obstructive pulmonary disease) with chronic bronchitis ([place])   . Decreased pulses in feet   . Dysrhythmia    afib, followed by Dr. Stanford Breed   . Edema of both legs   . Esophagitis 02/02/2014   Distal, linear erosions, noted on endoscopy  . Fatty liver 01/07/2012  . Fibroid 1974   fibroid cyst on left fallopian tube  . Fibromyalgia   . Foot ulcer ([place])    AREA HEALED RIGHT FOOT  . GERD  (gastroesophageal reflux disease)   . Headache(784.0)    occasional sinus headache   . Hepatomegaly   . History of blood transfusion JULY 2015  . History of cardiomegaly 12/06/2010   Noted on CT  . History of E. coli septicemia   . Hypertension   . Hypoparathyroidism ([place]) 01/07/2011  . Hypothyroidism   . Kidney stone 10-17-2015   passed on their own  . Morbid obesity ([place])   . MRSA infection   . Nephrolithiasis 2/16, 9/16   SEES DR Risa Grill  . Pernicious anemia 02/20/2014   followed by Debbrah Alar  . Pneumonia   . PONV (postoperative nausea and vomiting)   . Rash    thighs and back  . Seizures ([place])    infancy secondary to fever  . Staph aureus infection   . Thoracic spondylosis 12/06/2010   Noted on CT  . Thyroid cancer ([place]) 2011   THYROIDECTOMY DONE  . Uterine cancer ([place]) 2014   Mirena IUD  . Vitamin B 12 deficiency   . Vitamin D deficiency     Patient Active Problem List   Diagnosis Date Noted  . Sepsis due to Escherichia coli (E. coli) ([place]) 11/28/2017  . E coli bacteremia 11/28/2017  . Sepsis ([place]) 11/24/2017  . Hypokalemia 12/10/2016  . Hypocalcemia 12/10/2016  . Recurrent  falls 12/09/2016  . Right ureteral stone 07/17/2014  . Osteomyelitis ([place]) 06/14/2014  . Pernicious anemia 02/20/2014  . Reflux esophagitis 02/02/2014  . Edema 11/23/2013  . Foot pain 03/31/2013  . Endometrial adenocarcinoma ([place]) 02/21/2013  . Trochanteric bursitis of left hip 09/13/2012  . Muscle cramps 08/12/2012  . Fatty liver 01/07/2012  . Mid back pain 09/30/2011  . Weight gain 07/02/2011  . Nonspecific abnormal unspecified cardiovascular function study 04/30/2011  . Hx of papillary thyroid carcinoma 04/07/2011  . Low back pain 04/01/2011  . Atrial fibrillation ([place]) 02/05/2011  . Hypoparathyroidism ([place]) 01/07/2011  . GERD 05/22/2009  . Leukocytosis 03/24/2009  . OBESITY, MORBID 12/20/2008  . RHINOSINUSITIS, RECURRENT 09/07/2008  . Vitamin D deficiency 05/10/2007  .  Iron deficiency anemia secondary to malabsorption  05/10/2007  . Hypothyroidism 01/05/2007  . Essential hypertension 01/05/2007  . ALLERGIC RHINITIS 01/05/2007  . Asthma 01/05/2007    Past Surgical History:  Procedure Laterality Date  . AMPUTATION Right 05/18/2014   Procedure: RIGHT FIFTH RAY AMPUTATION FOOT;  Surgeon: Wylene Simmer, MD;  Location: [facility];  Service: Orthopedics;  Laterality: Right;  . APPENDECTOMY    . BUNIONECTOMY     bilateral  . COLONOSCOPY WITH PROPOFOL N/A 02/02/2014   Procedure: COLONOSCOPY WITH PROPOFOL;  Surgeon: Irene Shipper, MD;  Location: [facility];  Service: Endoscopy;  Laterality: N/A;  . cyst on ovary removed     . CYSTOSCOPY W/ RETROGRADES  03/03/2012   Procedure: CYSTOSCOPY WITH RETROGRADE PYELOGRAM;  Surgeon: Bernestine Amass, MD;  Location: [facility];  Service: Urology;  Laterality: Bilateral;  . CYSTOSCOPY W/ URETERAL STENT PLACEMENT Right 11/25/2017   Procedure: CYSTOSCOPY WITH RETROGRADE RIGHT URETERAL STENT PLACEMENT;  Surgeon: Franchot Gallo, MD;  Location: [facility];  Service: Urology;  Laterality: Right;  . CYSTOSCOPY W/ URETERAL STENT PLACEMENT Right 01/15/2018   Procedure: RIGHT STENT EXCHANGE;  Surgeon: Ardis Hughs, MD;  [facility];  Service: Urology;  Laterality: Right;  . CYSTOSCOPY WITH RETROGRADE PYELOGRAM, URETEROSCOPY AND STENT PLACEMENT Left 08/25/2012   Procedure: CYSTOSCOPY WITH RETROGRADE PYELOGRAM, URETEROSCOPY;  Surgeon: Bernestine Amass, MD;  [facility];  Service: Urology;  Laterality: Left;  . CYSTOSCOPY WITH STENT PLACEMENT Left 01/15/2018   Procedure: LEFT STENT PLACEMENT;  Surgeon: Ardis Hughs, MD;  [facility];  Service: Urology;  Laterality: Left;  . CYSTOSCOPY WITH STENT PLACEMENT Right 01/22/2018   Procedure: RIGHT STENT REMOVAL;  Surgeon: Ardis Hughs, MD;  [facility];  Service: Urology;  Laterality: Right;  . CYSTOSCOPY/URETEROSCOPY/HOLMIUM LASER/STENT PLACEMENT Right 12/23/2017   Procedure:  RIGHT URETEROSCOPY STONE REMOVAL, HOLMIUM LASER, RIGHT /STENT EXCHANGE;  Surgeon: Ardis Hughs, MD;  [facility];  Service: Urology;  Laterality: Right;  . CYSTOSCOPY/URETEROSCOPY/HOLMIUM LASER/STENT PLACEMENT Left 01/22/2018   Procedure: LEFT URETEROSCOPY STONE REMOVAL HOLMIUM LASER LEFT STENT Freddi Starr;  Surgeon: Ardis Hughs, MD;  [facility];  Service: Urology;  Laterality: Left;  . DILATION AND CURETTAGE OF UTERUS N/A 02/21/2013   Procedure: DILATATION AND CURETTAGE;  Surgeon: Lyman Speller, MD;  Location: [facility];  Service: Gynecology;  Laterality: N/A;  . DILATION AND CURETTAGE OF UTERUS N/A 04/09/2015   Procedure: [place] IUD removal;  Surgeon: Megan Salon, MD;  Location: [facility];  Service: Gynecology;  Laterality: N/A;  Patient weight 307lbs  . ESOPHAGOGASTRODUODENOSCOPY (EGD) WITH PROPOFOL N/A 02/02/2014   Procedure: ESOPHAGOGASTRODUODENOSCOPY (EGD) WITH PROPOFOL;  Surgeon: Irene Shipper, MD;  [facility];  Service: Endoscopy;  Laterality: N/A;  . GASTRIC BYPASS  1974  . HOLMIUM LASER APPLICATION Left 1/0/6269   Procedure: HOLMIUM LASER APPLICATION;  Surgeon: Bernestine Amass, MD;  [facility];  Service: Urology;  Laterality: Left;  . LITHOTRIPSY  03/2012  . LITHOTRIPSY  2/16  . THYROIDECTOMY  05/15/2010  . TONSILLECTOMY AND ADENOIDECTOMY    . URETEROSCOPY  03/03/2012   Procedure: URETEROSCOPY;  Surgeon: Bernestine Amass, MD;  [facility];  Service: Urology;  Laterality: Left;  . URETEROSCOPY WITH HOLMIUM LASER LITHOTRIPSY Bilateral 01/15/2018   Procedure: CYSTOSCOPY/BILATERAL URETEROSCOPY WITH HOLMIUM LASER LITHOTRIPSY STONE REMOVAL;  Surgeon: Ardis Hughs, MD;  [facility];  Service: Urology;  Laterality: Bilateral;     OB History    Gravida  0   Para  0   Term  0   Preterm  0   AB  0   Living  0     SAB  0   TAB  0   Ectopic  0   Multiple  0   Live Births                Home Medications    Prior to Admission medications   Medication Sig Start Date End Date Taking? Authorizing Provider  acetaminophen (TYLENOL) 325 MG tablet Take 2 tablets (650 mg total) by mouth every 6 (six) hours as needed for mild pain or moderate pain. 12/08/16   Waynetta Pean, PA-C  albuterol (PROVENTIL HFA;VENTOLIN HFA) 108 (90 Base) MCG/ACT inhaler Inhale 2 puffs into the lungs every 6 (six) hours as needed for wheezing or shortness of breath. 11/10/16   Debbrah Alar, NP  albuterol (PROVENTIL) (2.5 MG/3ML) 0.083% nebulizer solution Take 3 mLs (2.5 mg total) by nebulization every 6 (six) hours as needed for wheezing or shortness of breath. 01/29/18   Debbrah Alar, NP  calcitRIOL (ROCALTROL) 0.5 MCG capsule Take 2 mcg by mouth daily.     [provider]  calcium elemental as carbonate (TUMS ULTRA 1000) 400 MG chewable tablet Chew 2,000 mg by mouth 2 (two) times daily.    [provider]  citric acid-potassium citrate (POLYCITRA) 1100-334 MG/5ML solution Take 1 mL by mouth daily.  02/17/17   [provider]  cyanocobalamin (,VITAMIN B-12,) 1000 MCG/ML injection Inject 1 mL (1,000 mcg total) into the skin every 30 (thirty) days. 08/04/18   Debbrah Alar, NP  desoximetasone (TOPICORT) 0.05 % cream Apply topically 2 (two) times daily as needed. Patient taking differently: Apply 1 application topically 2 (two) times daily as needed (for rash).  01/29/17   Debbrah Alar, NP  diclofenac sodium (VOLTAREN) 1 % GEL Apply 2 g topically 4 (four) times daily. 05/05/18   Debbrah Alar, NP  diltiazem (CARDIZEM CD) 240 MG 24 hr capsule TAKE 1 CAPSULE BY MOUTH  DAILY 08/18/18   Debbrah Alar, NP  diphenhydrAMINE (BENADRYL) 25 mg capsule Take 25 mg by mouth every 6 (six) hours as needed for itching.     [provider]  diphenhydrAMINE-PE-APAP (RA SEVERE ALLERGY PLUS SINUS PO) Take 2 tablets by mouth daily as needed (for severe allergy).     [provider]  ELIQUIS 5 MG TABS tablet TAKE 1 TABLET BY MOUTH TWO  TIMES DAILY 09/16/18   Lelon Perla, MD  EPINEPHrine (EPIPEN 2-PAK) 0.3 mg/0.3 mL IJ SOAJ injection Inject 0.3 mg into the muscle once.    [provider]  fluocinonide (LIDEX) 0.05 % external solution Apply 1 application  topically 2 (two) times daily as needed (for rash).    [provider]  fluticasone (VERAMYST) 27.5 MCG/SPRAY nasal spray Place 2 sprays into the nose daily.    [provider]  furosemide (LASIX) 40 MG tablet TAKE 2 TABLETS BY MOUTH  DAILY IN THE MORNING AND 1  TABLET IN THE EVENING 08/18/18   Debbrah Alar, NP  hydrochlorothiazide (HYDRODIURIL) 25 MG tablet TAKE 1 TABLET(25 MG) BY MOUTH DAILY IN THE MORNING 08/13/18   Debbrah Alar, NP  levocetirizine (XYZAL) 5 MG tablet TAKE 1 TABLET BY MOUTH  EVERY EVENING 08/18/18   Debbrah Alar, NP  levothyroxine (SYNTHROID, LEVOTHROID) 100 MCG tablet Take 350 mcg by mouth daily before breakfast.     [provider]  loratadine (CLARITIN) 10 MG tablet Take 10 mg by mouth every morning.    [provider]  Melatonin 5 MG TABS Take 5 mg by mouth at bedtime as needed (for sleep).     [provider]  mometasone (ELOCON) 0.1 % cream  08/04/18   [provider]  montelukast (SINGULAIR) 10 MG tablet Take 1 tablet (10 mg total) by mouth at bedtime. 05/05/18   Debbrah Alar, NP  nystatin (MYCOSTATIN) 100000 UNIT/ML suspension TAKE 5 ML BY MOUTH FOUR TIMES DAILY 08/24/18   Debbrah Alar, NP  nystatin (MYCOSTATIN/NYSTOP) powder Apply topically 3 (three) times daily. 11/28/17   Regalado, Belkys A, MD  nystatin cream (MYCOSTATIN) Apply 1 application topically 2 (two) times daily. 05/05/18   Debbrah Alar, NP  omeprazole (PRILOSEC) 40 MG capsule TAKE 1 CAPSULE BY MOUTH  DAILY 08/18/18   Debbrah Alar, NP  ondansetron (ZOFRAN) 4 MG tablet Take 1 tablet (4 mg total) by mouth  every 8 (eight) hours as needed for nausea or vomiting. 12/30/17   Debbrah Alar, NP  potassium chloride 20 MEQ/15ML (10%) SOLN Take 15 mLs (20 mEq total) by mouth 2 (two) times daily. 10/12/18   Debbrah Alar, NP  saccharomyces boulardii (FLORASTOR) 250 MG capsule Take 1 capsule (250 mg total) by mouth 2 (two) times daily. Patient taking differently: Take 250 mg by mouth daily.  11/28/17   Regalado, Cassie Freer, MD  SYMBICORT 160-4.5 MCG/ACT inhaler INHALE 2 PUFFS BY MOUTH TWO TIMES DAILY 06/17/18   Debbrah Alar, NP  Syringe/Needle, Disp, (SYRINGE 3CC/20GX1") 20G X 1" 3 ML MISC Use monthly as directed 08/04/18   Debbrah Alar, NP  traMADol (ULTRAM) 50 MG tablet TAKE 1 TABLET(50 MG) BY MOUTH EVERY 12 HOURS AS NEEDED 10/13/18   Trula Slade, DPM  valACYclovir (VALTREX) 1000 MG tablet 2 tabs by mouth now and again in 12 hours 01/27/18   Debbrah Alar, NP  Vitamin D, Ergocalciferol, (DRISDOL) 1.25 MG (50000 UT) CAPS capsule Take 1 capsule (50,000 Units total) by mouth every Monday, Wednesday, and Friday. 05/31/18   Debbrah Alar, NP  zafirlukast (ACCOLATE) 20 MG tablet TAKE 1 TABLET BY MOUTH TWO  TIMES DAILY BEFORE MEALS 08/18/18   Debbrah Alar, NP    Family History Family History  Problem Relation Age of Onset  . Hypertension Father   . Diabetes Father   . Lung cancer Father   . Heart attack Father        MI at age 4  . Hypertension Mother   . Hyperthyroidism Mother   . Heart disease Mother   . Heart attack Mother        MI at age 69  . Asthma Brother   . Hypertension Brother  younger  . Heart disease Brother        older  . Colon cancer Neg Hx   . Esophageal cancer Neg Hx   . Stomach cancer Neg Hx   . Kidney disease Neg Hx   . Liver disease Neg Hx     Social History Social History   Tobacco Use  . Smoking status: Former Smoker    Packs/day: 0.50    Years: 25.00    Pack years: 12.50    Types: Cigarettes    Start date: 12/24/1970     Last attempt to quit: 05/27/1995    Years since quitting: 23.4  . Smokeless tobacco: Never Used  . Tobacco comment: quit smoking 19 years ago  Substance Use Topics  . Alcohol use: Yes    Alcohol/week: 0.0 standard drinks    Comment: 1/2 glass per month  . Drug use: No     Allergies   Other; Penicillins; Amoxicillin-pot clavulanate; Ciprofloxacin; Food; and Tomato   Review of Systems Review of Systems  Gastrointestinal: Positive for diarrhea.  Neurological: Positive for weakness.  All other systems reviewed and are negative.    Physical Exam Updated Vital Signs BP (!) 129/54 (BP Location: Right Arm)   Pulse 98   Temp 99.4 F (37.4 C) (Oral)   Resp 15   Ht 5\' 4"  (1.626 m)   Wt 129.3 kg   LMP 06/11/2012   SpO2 98%   BMI 48.92 kg/m   Physical Exam Vitals signs and nursing note reviewed.  Constitutional:      General: She is not in acute distress.    Appearance: She is well-developed. She is not diaphoretic.  HENT:     Head: Normocephalic and atraumatic.  Neck:     Musculoskeletal: Normal range of motion and neck supple.  Cardiovascular:     Rate and Rhythm: Normal rate and regular rhythm.     Heart sounds: No murmur. No friction rub. No gallop.   Pulmonary:     Effort: Pulmonary effort is normal. No respiratory distress.     Breath sounds: Normal breath sounds. No wheezing.  Abdominal:     General: Bowel sounds are normal. There is no distension.     Palpations: Abdomen is soft.     Tenderness: There is no abdominal tenderness.     Comments: Abdomen is obese.  There is some erythema and inflammation in the intertrigo in his areas of her pannus, greater on left than right.  Musculoskeletal: Normal range of motion.  Skin:    General: Skin is warm and dry.  Neurological:     Mental Status: She is alert and oriented to person, place, and time.      ED Treatments / Results  Labs (all labs ordered are listed, but only abnormal results are displayed) Labs  Reviewed  CBG MONITORING, ED - Abnormal; Notable for the following components:      Result Value   Glucose-Capillary 130 (*)    All other components within normal limits  CBC  URINALYSIS, ROUTINE W REFLEX MICROSCOPIC  COMPREHENSIVE METABOLIC PANEL  TROPONIN I  LIPASE, BLOOD  CBG MONITORING, ED    EKG EKG Interpretation  Date/Time:  Wednesday Oct 20 2018 11:01:28 EDT Ventricular Rate:  91 PR Interval:    QRS Duration: 97 QT Interval:  374 QTC Calculation: 461 R Axis:   35 Text Interpretation:  Sinus rhythm Atrial premature complexes Probable left ventricular hypertrophy Confirmed by Veryl Speak [phone]) on 10/20/2018 3:41:27 PM  Radiology No results found.  Procedures Procedures (including critical care time)  Medications Ordered in ED Medications  sodium chloride flush (NS) 0.9 % injection 3 mL (has no administration in time range)  sodium chloride 0.9 % bolus 1,000 mL (has no administration in time range)  ondansetron (ZOFRAN) injection 4 mg (has no administration in time range)     Initial Impression / Assessment and Plan / ED Course  I have reviewed the triage vital signs and the nursing notes.  Pertinent labs & imaging results that were available during my care of the patient were reviewed by me and considered in my medical decision making (see chart for details).  Patient presents here with complaints of weakness and loose stools.  Patient has a hemoglobin of 6.7, white count of 26,000, and is hypokalemic with a potassium of 2.6.  Patient experienced back pain while in the ER and a CT scan was obtained showing what appears to be a recently passed renal calculus, but no other pathology.  Patient currently receiving 2 units of packed red cells.  She was also given antibiotics for her leukocytosis of undetermined etiology.  Blood cultures are pending.  Urinalysis is currently being obtained by the nurse.  Patient will be admitted to the hospitalist service under  the care of Dr. Lonny Prude.  CRITICAL CARE Performed by: Veryl Speak Total critical care time: 45 minutes Critical care time was exclusive of separately billable procedures and treating other patients. Critical care was necessary to treat or prevent imminent or life-threatening deterioration. Critical care was time spent personally by me on the following activities: development of treatment plan with patient and/or surrogate as well as nursing, discussions with consultants, evaluation of patient's response to treatment, examination of patient, obtaining history from patient or surrogate, ordering and performing treatments and interventions, ordering and review of laboratory studies, ordering and review of radiographic studies, pulse oximetry and re-evaluation of patient's condition.   Final Clinical Impressions(s) / ED Diagnoses   Final diagnoses:  None    ED Discharge Orders    None       Veryl Speak, MD 10/20/18 [phone]

## 2018-10-20 NOTE — ED Triage Notes (Signed)
PT states generalized weakness x 3 days, vomiting last night and diarrhea starting this am.  She was feeling weak after getting out of shower and slid out of chair.  Denies loc.  AO x 4, presently.

## 2018-10-20 NOTE — H&P (Signed)
[facility] History and Physical  [name] OEV:[id number] DOB: 03/29/1956 DOA: 10/20/2018  Referring physician: Veryl Speak PCP: Debbrah Alar, NP   Chief Complaint: weakness, vomiting, back pain  HPI: [name] is a 63 y.o. female with medical history significant for iron deficiency anemia, pernicious anemia, gastric bypass and absorption, chronic atrial fibrillation on Eliquis who presents on 10/20/2018 with complaint of nausea, vomiting, back pain.  She first noticed back pain several days ago that she states is consistent with her prior episode of kidney stones.  She did not notice any blood in her urine or pain with urination at that time.  If they worsen with 4 episodes of nonbilious, nonbloody vomiting a day prior to presenting to the ED.  Without any abdominal pain, no fevers, no chills, no recent sick contacts, no new medications, no recent travel.  This was followed by diarrhea that started this morning without any blood or black tarry stool.  She felt much more weak than usual while getting out of the shower and slid out of the chair onto the floor without any head trauma or loss of consciousness.  Of note patient has history of recurrent kidney stones, last evaluated by [facility] on 01/2018 for ureteroscopy for evaluation of stent (which was found to have passed at that time).Of note in past kidney stones deemed releated to hypocitraturia and hyperoxaluria. She denies any new symptoms until recently    ED Course: In the ED T-max nine 9.4, respiratory rate 21, normal oxygen saturation, heart rate 101, blood pressure 160/56-99/57  Lab work-up notable for UA with large leukocytes, few bacteria, WBC 21-15, moderate hemoglobin, COVID testing negative, hemoglobin 6.7, WBC 25.6, potassium 2.6, creatinine 1.12.  Given elevated WBC, tachycardia, tachypnea blood cultures x2 obtained.  Patient was started on empiric vancomycin and cefepime. CT abdomen in ED show  multiple bilateral nonobstructive renal calculi, mild right hydronephrosis and hydroureter with stranding about the right kidney, pleasant with recently passed right sided ureteral calculus. Chest x-ray negative for active disease. Patient was given IV potassium as well for supplementation. Tried hospice was called for further management and admission   Review of Systems:  Constitutional:  No weight loss, night sweats, Fevers, chills, fatigue.  HEENT:  No headaches, Difficulty swallowing,Tooth/dental problems,Sore throat,  No sneezing, itching, ear ache, nasal congestion, post nasal drip,  Cardio-vascular:  No chest pain, Orthopnea, PND, swelling in lower extremities, anasarca, dizziness, palpitations  GI:  No heartburn, indigestion, abdominal pain, nausea, vomiting, diarrhea, change in bowel habits, loss of appetite  Resp:  No shortness of breath with exertion or at rest. No excess mucus, no productive cough, No non-productive cough, No coughing up of blood.No change in color of mucus.No wheezing.No chest wall deformity  Skin:  no rash or lesions.  GU:  no dysuria, change in color of urine, no urgency or frequency. No flank pain.  Musculoskeletal:  No joint pain or swelling. No decreased range of motion. No back pain.  Psych:  No change in mood or affect. No depression or anxiety. No memory loss.   Past Medical History:  Diagnosis Date   Abnormal Pap smear    years ago/no biopsy   Allergic rhinitis    Anemia, iron deficiency    iron infusion scheduled for 04/02/2018   Aortic atherosclerosis ([place])    Arthritis    back- lower   Asthma    Atrial fibrillation ([place]) August 2012   OFF XARELTO LAST MONTH DUE TO BLEEDING IN STOOL  Bursitis of hip left   Chronic diastolic (congestive) heart failure (HCC)    pt unaware of this   COPD (chronic obstructive pulmonary disease) with chronic bronchitis (HCC)    Decreased pulses in feet    Dysrhythmia    afib, followed by  Dr. Stanford Breed    Edema of both legs    Esophagitis 02/02/2014   Distal, linear erosions, noted on endoscopy   Fatty liver 01/07/2012   Fibroid 1974   fibroid cyst on left fallopian tube   Fibromyalgia    Foot ulcer ([place])    AREA HEALED RIGHT FOOT   GERD (gastroesophageal reflux disease)    Headache(784.0)    occasional sinus headache    Hepatomegaly    History of blood transfusion JULY 2015   History of cardiomegaly 12/06/2010   Noted on CT   History of E. coli septicemia    Hypertension    Hypoparathyroidism ([place]) 01/07/2011   Hypothyroidism    Kidney stone Sep 28, 2015   passed on their own   Morbid obesity ([place])    MRSA infection    Nephrolithiasis 2/16, 9/16   SEES DR GRAPEY   Pernicious anemia 02/20/2014   followed by Debbrah Alar   Pneumonia    PONV (postoperative nausea and vomiting)    Rash    thighs and back   Seizures ([place])    infancy secondary to fever   Staph aureus infection    Thoracic spondylosis 12/06/2010   Noted on CT   Thyroid cancer ([place]) 2011   THYROIDECTOMY DONE   Uterine cancer ([place]) 2014   Mirena IUD   Vitamin B 12 deficiency    Vitamin D deficiency    Past Surgical History:  Procedure Laterality Date   AMPUTATION Right 05/18/2014   Procedure: RIGHT FIFTH RAY AMPUTATION FOOT;  Surgeon: Wylene Simmer, MD;  Location: [facility];  Service: Orthopedics;  Laterality: Right;   APPENDECTOMY     BUNIONECTOMY     bilateral   COLONOSCOPY WITH PROPOFOL N/A 02/02/2014   Procedure: COLONOSCOPY WITH PROPOFOL;  Surgeon: Irene Shipper, MD;  [facility];  Service: Endoscopy;  Laterality: N/A;   cyst on ovary removed      CYSTOSCOPY W/ RETROGRADES  03/03/2012   Procedure: CYSTOSCOPY WITH RETROGRADE PYELOGRAM;  Surgeon: Bernestine Amass, MD;  Location: [facility];  Service: Urology;  Laterality: Bilateral;   CYSTOSCOPY W/ URETERAL STENT PLACEMENT Right 11/25/2017   Procedure: CYSTOSCOPY WITH RETROGRADE RIGHT URETERAL STENT  PLACEMENT;  Surgeon: Franchot Gallo, MD;  Location: [facility];  Service: Urology;  Laterality: Right;   CYSTOSCOPY W/ URETERAL STENT PLACEMENT Right 01/15/2018   Procedure: RIGHT STENT EXCHANGE;  Surgeon: Ardis Hughs, MD;  [facility];  Service: Urology;  Laterality: Right;   CYSTOSCOPY WITH RETROGRADE PYELOGRAM, URETEROSCOPY AND STENT PLACEMENT Left 08/25/2012   Procedure: CYSTOSCOPY WITH RETROGRADE PYELOGRAM, URETEROSCOPY;  Surgeon: Bernestine Amass, MD;  [facility];  Service: Urology;  Laterality: Left;   CYSTOSCOPY WITH STENT PLACEMENT Left 01/15/2018   Procedure: LEFT STENT PLACEMENT;  Surgeon: Ardis Hughs, MD;  [facility];  Service: Urology;  Laterality: Left;   CYSTOSCOPY WITH STENT PLACEMENT Right 01/22/2018   Procedure: RIGHT STENT REMOVAL;  Surgeon: Ardis Hughs, MD;  [facility];  Service: Urology;  Laterality: Right;   CYSTOSCOPY/URETEROSCOPY/HOLMIUM LASER/STENT PLACEMENT Right 12/23/2017   Procedure: RIGHT URETEROSCOPY STONE REMOVAL, HOLMIUM LASER, RIGHT /STENT EXCHANGE;  Surgeon: Ardis Hughs, MD;  [facility];  Service: Urology;  Laterality: Right;   CYSTOSCOPY/URETEROSCOPY/HOLMIUM LASER/STENT PLACEMENT Left 01/22/2018   Procedure: LEFT URETEROSCOPY STONE REMOVAL HOLMIUM LASER LEFT STENT Freddi Starr;  Surgeon: Ardis Hughs, MD;  [facility];  Service: Urology;  Laterality: Left;   DILATION AND CURETTAGE OF UTERUS N/A 02/21/2013   Procedure: DILATATION AND CURETTAGE;  Surgeon: Lyman Speller, MD;  Location: [facility];  Service: Gynecology;  Laterality: N/A;   DILATION AND CURETTAGE OF UTERUS N/A 04/09/2015   Procedure: [place] IUD removal;  Surgeon: Megan Salon, MD;  Location: [facility];  Service: Gynecology;  Laterality: N/A;  Patient weight 307lbs   ESOPHAGOGASTRODUODENOSCOPY (EGD) WITH PROPOFOL N/A 02/02/2014   Procedure: ESOPHAGOGASTRODUODENOSCOPY (EGD) WITH PROPOFOL;   Surgeon: Irene Shipper, MD;  [facility];  Service: Endoscopy;  Laterality: N/A;   GASTRIC BYPASS  1974   HOLMIUM LASER APPLICATION Left 12/01/9379   Procedure: HOLMIUM LASER APPLICATION;  Surgeon: Bernestine Amass, MD;  [facility];  Service: Urology;  Laterality: Left;   LITHOTRIPSY  03/2012   LITHOTRIPSY  2/16   THYROIDECTOMY  05/15/2010   TONSILLECTOMY AND ADENOIDECTOMY     URETEROSCOPY  03/03/2012   Procedure: URETEROSCOPY;  Surgeon: Bernestine Amass, MD;  [facility];  Service: Urology;  Laterality: Left;   URETEROSCOPY WITH HOLMIUM LASER LITHOTRIPSY Bilateral 01/15/2018   Procedure: CYSTOSCOPY/BILATERAL URETEROSCOPY WITH HOLMIUM LASER LITHOTRIPSY STONE REMOVAL;  Surgeon: Ardis Hughs, MD;  [facility];  Service: Urology;  Laterality: Bilateral;   Social History:  reports that she quit smoking about 23 years ago. Her smoking use included cigarettes. She started smoking about 47 years ago. She has a 12.50 pack-year smoking history. She has never used smokeless tobacco. She reports current alcohol use. She reports that she does not use drugs.  Allergies  Allergen Reactions   Other Anaphylaxis    NUTS   Penicillins Other (See Comments)    Headache. And hives  Has patient had a PCN reaction causing immediate rash, facial/tongue/throat swelling, SOB or lightheadedness with hypotension: No Has patient had a PCN reaction causing severe rash involving mucus membranes or skin necrosis: No Has patient had a PCN reaction that required hospitalization: No Has patient had a PCN reaction occurring within the last 10 years: Yes If all of the above answers are "NO", then may proceed with Cephalosporin use.    Amoxicillin-Pot Clavulanate Other (See Comments)    headache   Ciprofloxacin Nausea Only and Rash   Food Hives and Other (See Comments)    Potato   Tomato Hives    Family History  Problem Relation Age of Onset   Hypertension Father    Diabetes  Father    Lung cancer Father    Heart attack Father        MI at age 1   Hypertension Mother    Hyperthyroidism Mother    Heart disease Mother    Heart attack Mother        MI at age 6   Asthma Brother    Hypertension Brother        younger   Heart disease Brother        older   Colon cancer Neg Hx    Esophageal cancer Neg Hx    Stomach cancer Neg Hx    Kidney disease Neg Hx    Liver disease Neg Hx     Prior to Admission medications   Medication Sig Start Date End Date Taking? Authorizing Provider  acetaminophen (  TYLENOL) 325 MG tablet Take 2 tablets (650 mg total) by mouth every 6 (six) hours as needed for mild pain or moderate pain. 12/08/16   Waynetta Pean, PA-C  albuterol (PROVENTIL HFA;VENTOLIN HFA) 108 (90 Base) MCG/ACT inhaler Inhale 2 puffs into the lungs every 6 (six) hours as needed for wheezing or shortness of breath. 11/10/16   Debbrah Alar, NP  albuterol (PROVENTIL) (2.5 MG/3ML) 0.083% nebulizer solution Take 3 mLs (2.5 mg total) by nebulization every 6 (six) hours as needed for wheezing or shortness of breath. 01/29/18   Debbrah Alar, NP  calcitRIOL (ROCALTROL) 0.5 MCG capsule Take 2 mcg by mouth daily.     [provider]  calcium elemental as carbonate (TUMS ULTRA 1000) 400 MG chewable tablet Chew 2,000 mg by mouth 2 (two) times daily.    [provider]  citric acid-potassium citrate (POLYCITRA) 1100-334 MG/5ML solution Take 1 mL by mouth daily.  02/17/17   [provider]  cyanocobalamin (,VITAMIN B-12,) 1000 MCG/ML injection Inject 1 mL (1,000 mcg total) into the skin every 30 (thirty) days. 08/04/18   Debbrah Alar, NP  desoximetasone (TOPICORT) 0.05 % cream Apply topically 2 (two) times daily as needed. Patient taking differently: Apply 1 application topically 2 (two) times daily as needed (for rash).  01/29/17   Debbrah Alar, NP  diclofenac sodium (VOLTAREN) 1 % GEL Apply 2 g topically 4 (four) times  daily. 05/05/18   Debbrah Alar, NP  diltiazem (CARDIZEM CD) 240 MG 24 hr capsule TAKE 1 CAPSULE BY MOUTH  DAILY 08/18/18   Debbrah Alar, NP  diphenhydrAMINE (BENADRYL) 25 mg capsule Take 25 mg by mouth every 6 (six) hours as needed for itching.     [provider]  diphenhydrAMINE-PE-APAP (RA SEVERE ALLERGY PLUS SINUS PO) Take 2 tablets by mouth daily as needed (for severe allergy).    [provider]  ELIQUIS 5 MG TABS tablet TAKE 1 TABLET BY MOUTH TWO  TIMES DAILY 09/16/18   Lelon Perla, MD  EPINEPHrine (EPIPEN 2-PAK) 0.3 mg/0.3 mL IJ SOAJ injection Inject 0.3 mg into the muscle once.    [provider]  fluocinonide (LIDEX) 0.05 % external solution Apply 1 application topically 2 (two) times daily as needed (for rash).    [provider]  fluticasone (VERAMYST) 27.5 MCG/SPRAY nasal spray Place 2 sprays into the nose daily.    [provider]  furosemide (LASIX) 40 MG tablet TAKE 2 TABLETS BY MOUTH  DAILY IN THE MORNING AND 1  TABLET IN THE EVENING 08/18/18   Debbrah Alar, NP  hydrochlorothiazide (HYDRODIURIL) 25 MG tablet TAKE 1 TABLET(25 MG) BY MOUTH DAILY IN THE MORNING Patient taking differently: Take 25 mg by mouth daily with breakfast.  08/13/18   Debbrah Alar, NP  levocetirizine (XYZAL) 5 MG tablet TAKE 1 TABLET BY MOUTH  EVERY EVENING 08/18/18   Debbrah Alar, NP  levothyroxine (SYNTHROID, LEVOTHROID) 100 MCG tablet Take 350 mcg by mouth daily before breakfast.     [provider]  loratadine (CLARITIN) 10 MG tablet Take 10 mg by mouth every morning.    [provider]  Melatonin 5 MG TABS Take 5 mg by mouth at bedtime as needed (for sleep).     [provider]  mometasone (ELOCON) 0.1 % cream  08/04/18   [provider]  montelukast (SINGULAIR) 10 MG tablet Take 1 tablet (10 mg total) by mouth at bedtime. 05/05/18   Debbrah Alar, NP  nystatin (MYCOSTATIN) 100000  UNIT/ML  suspension TAKE 5 ML BY MOUTH FOUR TIMES DAILY Patient taking differently: Use as directed 5 mLs in the mouth or throat 4 (four) times daily.  08/24/18   Debbrah Alar, NP  nystatin (MYCOSTATIN/NYSTOP) powder Apply topically 3 (three) times daily. 11/28/17   Regalado, Belkys A, MD  nystatin cream (MYCOSTATIN) Apply 1 application topically 2 (two) times daily. 05/05/18   Debbrah Alar, NP  omeprazole (PRILOSEC) 40 MG capsule TAKE 1 CAPSULE BY MOUTH  DAILY 08/18/18   Debbrah Alar, NP  ondansetron (ZOFRAN) 4 MG tablet Take 1 tablet (4 mg total) by mouth every 8 (eight) hours as needed for nausea or vomiting. 12/30/17   Debbrah Alar, NP  potassium chloride 20 MEQ/15ML (10%) SOLN Take 15 mLs (20 mEq total) by mouth 2 (two) times daily. 10/12/18   Debbrah Alar, NP  saccharomyces boulardii (FLORASTOR) 250 MG capsule Take 1 capsule (250 mg total) by mouth 2 (two) times daily. Patient taking differently: Take 250 mg by mouth daily.  11/28/17   Regalado, Belkys A, MD  SYMBICORT 160-4.5 MCG/ACT inhaler INHALE 2 PUFFS BY MOUTH TWO TIMES DAILY Patient taking differently: Inhale 2 puffs into the lungs 2 (two) times a day.  06/17/18   Debbrah Alar, NP  Syringe/Needle, Disp, (SYRINGE 3CC/20GX1") 20G X 1" 3 ML MISC Use monthly as directed 08/04/18   Debbrah Alar, NP  traMADol (ULTRAM) 50 MG tablet TAKE 1 TABLET(50 MG) BY MOUTH EVERY 12 HOURS AS NEEDED 10/13/18   Trula Slade, DPM  valACYclovir (VALTREX) 1000 MG tablet 2 tabs by mouth now and again in 12 hours 01/27/18   Debbrah Alar, NP  Vitamin D, Ergocalciferol, (DRISDOL) 1.25 MG (50000 UT) CAPS capsule Take 1 capsule (50,000 Units total) by mouth every Monday, Wednesday, and Friday. 05/31/18   Debbrah Alar, NP  zafirlukast (ACCOLATE) 20 MG tablet TAKE 1 TABLET BY MOUTH TWO  TIMES DAILY BEFORE MEALS Patient taking differently: Take 20 mg by mouth 2 (two) times daily before a meal.  08/18/18   Debbrah Alar, NP   Physical Exam: Vitals:   10/20/18 1613 10/20/18 1615 10/20/18 1630 10/20/18 1645  BP:  (!) 117/53 (!) 115/56 108/63  Pulse:  (!) 101 98 95  Resp:  '18 12 19  ' Temp: 98.6 F (37 C)  99.2 F (37.3 C)   TempSrc: Oral  Tympanic   SpO2:  96% 96% 96%  Weight:      Height:        Wt Readings from Last 3 Encounters:  10/20/18 129.3 kg  08/04/18 135.1 kg  07/21/18 (!) 137.8 kg    Constitutional normal appearing, obese female, in discomfort but no acute distress Eyes: EOMI, anicteric, normal conjunctivae ENMT: Oropharynx with dry mucous membranes,  Neck: FROM,  Cardiovascular: RRR no MRGs, with no peripheral edema Respiratory: Normal respiratory effort, clear breath sounds  Abdomen: Soft,non-tender,  Musculoskeletal: Increased tenderness to palpation of right knee, right thigh, right shoulder. Genitourinary: Moisture related redness surrounding labia, no vaginal discharge Neurologic: Grossly no focal neuro deficit. Psychiatric:Appropriate affect, and mood. Mental status AAOx3          Labs on Admission:  Basic Metabolic Panel: Recent Labs  Lab 10/20/18 1110  NA 133*  K 2.6*  CL 90*  CO2 27  GLUCOSE 133*  BUN 21  CREATININE 1.12*  CALCIUM 8.7*   Liver Function Tests: Recent Labs  Lab 10/20/18 1110  AST 25  ALT 32  ALKPHOS 122  BILITOT 1.5*  PROT 7.6  ALBUMIN 3.5  Recent Labs  Lab 10/20/18 1110  LIPASE 28   No results for input(s): AMMONIA in the last 168 hours. CBC: Recent Labs  Lab 10/20/18 1110  WBC 25.6*  HGB 6.7*  HCT 22.3*  MCV 88.1  PLT 238   Cardiac Enzymes: Recent Labs  Lab 10/20/18 1110  TROPONINI <0.03    BNP (last 3 results) No results for input(s): BNP in the last 8760 hours.  ProBNP (last 3 results) No results for input(s): PROBNP in the last 8760 hours.  CBG: Recent Labs  Lab 10/20/18 1053  GLUCAP 130*    Radiological Exams on Admission: Dg Chest 1 View  Result Date: 10/20/2018 CLINICAL DATA:   Weakness EXAM: CHEST  1 VIEW COMPARISON:  06/30/2018 FINDINGS: Heart and mediastinal contours are within normal limits. No focal opacities or effusions. No acute bony abnormality. IMPRESSION: No active disease. Electronically Signed   By: Rolm Baptise M.D.   On: 10/20/2018 11:38   Ct Abdomen Pelvis W Contrast  Result Date: 10/20/2018 CLINICAL DATA:  Generalized weakness, vomiting, diarrhea EXAM: CT ABDOMEN AND PELVIS WITH CONTRAST TECHNIQUE: Multidetector CT imaging of the abdomen and pelvis was performed using the standard protocol following bolus administration of intravenous contrast. CONTRAST:  112m OMNIPAQUE IOHEXOL 300 MG/ML  SOLN COMPARISON:  03/26/2018 FINDINGS: Lower chest: No acute abnormality. Hepatobiliary: No focal liver abnormality is seen. Hepatic steatosis. No gallstones, gallbladder wall thickening, or biliary dilatation. Pancreas: Unremarkable. No pancreatic ductal dilatation or surrounding inflammatory changes. Spleen: Normal in size without focal abnormality. Adrenals/Urinary Tract: Adrenal glands are unremarkable. Multiple bilateral nonobstructive renal calculi. There is a 2 mm calculus in the dependent urinary bladder (series 3, image 85). There is mild right hydronephrosis and hydroureter with stranding about the right kidney. Stomach/Bowel: Stomach is within normal limits. Appendix appears normal. No evidence of bowel wall thickening, distention, or inflammatory changes. There is extensive small bowel and colonic diverticulosis. Vascular/Lymphatic: Calcific atherosclerosis. No enlarged abdominal or pelvic lymph nodes. Reproductive: No mass or other abnormality. IUD present in the endometrial cavity. Other: No abdominal wall hernia or abnormality. No abdominopelvic ascites. Musculoskeletal: No acute or significant osseous findings. Disc degenerative disease and ankylosis of the spine IMPRESSION: 1. Multiple bilateral nonobstructive renal calculi. There is a 2 mm calculus in the dependent  urinary bladder (series 3, image 85). There is mild right hydronephrosis and hydroureter with stranding about the right kidney. Findings are consistent with a recently passed right-sided ureteral calculus. 2.  Chronic and incidental findings as detailed above. Electronically Signed   By: AEddie CandleM.D.   On: 10/20/2018 14:49    EKG: Independently reviewed. normal EKG, normal sinus rhythm, unchanged from previous tracings.  Assessment/Plan Active Problems:   Hypothyroidism   OBESITY, MORBID   Iron deficiency anemia secondary to malabsorption    Leukocytosis   Essential hypertension   Asthma   GERD   Atrial fibrillation (HCC)   Hypokalemia   Sepsis ([place]  Sepsis syndrome, unclear etiology.  Presented with tachypnea, leukocytosis, T-max 99.4.  Has no acute endorgan damage.  Suspect this is more likely Sirs criteria being met in the setting of significant pain related to recently passed kidney stone.  We will continue empiric vancomycin and cefepime while monitoring blood cultures, encouraged by no infectious processes found on chest x-ray or CT abdomen, UA pending, COVID negative  Nausea, vomiting, and diarrhea, suspect likely related to nephrolithiasis (has since passed based off imaging) in patient with history of recurrent stones.  All occurred in the setting  of low back pain over several days the patient states is typical of her prior issue with kidney stones.  CT abdomen shows kidney stranding this seems consistent with recently passed ureteral stone, multiple nonobstructive renal calculi remain with mild right hydronephrosis.  Nausea, vomiting, diarrhea has not occurred in the ED.  We will continue to monitor, PRN antiemetics, monitor output, gentle hydration with timed IVF x 24 hours.  Acute on chronic diffuse body pain.  Predominantly in right shoulder right knee, likely worse in the setting of potential infection.  Seems consistent with fibromyalgia which patient has documented monitor  blood cultures, pain control with Tylenol, tramadol for now.  Escalate to IV regimen if does not improve   Acute on chronic hypokalemia, likely multifactorial related to diarrhea, vomiting, Lasix/HCTZ use We will hold off on home Lasix and HCTZ use.  Replete with oral potassium, monitor BMP.  No longer vomiting, closely monitor. Resume home potassium regimen once stable  Acute on chronic iron deficiency anemia/pernicious anemia secondary to malabsorption after gastric bypass Received 2U blood transfusion while in ED for hemoglobin of 6.7 (previous 11.5, 07/21/2018), monitor posttransfusion CBC.  Receives outpatient iron infusions followed by Dr. Marin Olp, check B12, iron/ferritin panel. Denies any hematuria, melena, or bright red blood per rectum. Will hold home eliquis for heparin.   Vaginal redness and soreness Findings seem related to increased moisture related to body habitus. Similar redness near other areas of her pannus. Does not look overtly infected. Nystatin cream as needed, monitor.   Chronic atrial fibrillation, rate controlled, currently in normal sinus rhythm ( documented on EKG) Continue home diltiazem, no acute bleeding but given acute worsening of chronic anemia will hold off on home eliquis and transition to IV heparin in case it worsens and anticoagulation needs to be stopped.Telemetry monitoring  Diastolic CHF, EF 73-22% (06/5425).  Euvolemic on exam.  Will hold off on Lasix given presentation with nausea/vomiting and diarrhea, monitor volume status.  Hypothyroidism Check TSH, continue Synthroid (quite elevated dosage of 350 mcg)  COPD/asthma Normal oxygen saturation on room air.  Continue home inhalers.  GERD, stable.  Continue PPI  Morbid obesity  Consultants: None Code Status: DNR, discussed with patient on day of admission DVT Prophylaxis: IV heparin Family Communication: No family at bedside Disposition Plan: admit as inpatient given significant anemia and  symptomatology, as well as sepsis syndrome and electrolyte abnormalities requiring IV antibiotic and supplementation respectively     Desiree Hane MD [facility]  Pager [phone]  If 7PM-7AM, please contact night-coverage [email or web address] Password [facility]  10/20/2018, 5:23 PM

## 2018-10-20 NOTE — Progress Notes (Signed)
Pharmacy Antibiotic Note  [name] is a 63 y.o. female admitted on 10/20/2018 with sepsis.  Pharmacy has been consulted for vancomycin and cefepime dosing. Pt received one-time doses in ED, Cr slightly up from baseline.  Plan: -Cefepime 2g IV q8h -Will give additional vancomycin 1000mg  IV x1 to complete 2000mg  IV load then vancomycin 1250mg  IV q24h starting tomorrow - est AUC 459 -Monitor cultures, LOT, renal function   Height: 5\' 4"  (162.6 cm) Weight: 285 lb (129.3 kg) IBW/kg (Calculated) : 54.7  Temp (24hrs), Avg:99.8 F (37.7 C), Min:98.6 F (37 C), Max:101.2 F (38.4 C)  Recent Labs  Lab 10/20/18 1110  WBC 25.6*  CREATININE 1.12*    Estimated Creatinine Clearance: 70.4 mL/min (A) (by C-G formula based on SCr of 1.12 mg/dL (H)).    Allergies  Allergen Reactions  . Other Anaphylaxis    NUTS  . Penicillins Other (See Comments)    Headache. And hives  Has patient had a PCN reaction causing immediate rash, facial/tongue/throat swelling, SOB or lightheadedness with hypotension: No Has patient had a PCN reaction causing severe rash involving mucus membranes or skin necrosis: No Has patient had a PCN reaction that required hospitalization: No Has patient had a PCN reaction occurring within the last 10 years: Yes If all of the above answers are "NO", then may proceed with Cephalosporin use.   [name] Amoxicillin-Pot Clavulanate Other (See Comments)    headache  . Ciprofloxacin Nausea Only and Rash  . Food Hives and Other (See Comments)    Potato  . Tomato Hives    Antimicrobials this admission: Vancomycin 5/27 >>  Cefepime 5/27 >>    Microbiology results: ordered  Thank you for allowing pharmacy to be a part of this patient's care.   Arrie Senate, PharmD, BCPS Clinical Pharmacist [phone] Please check AMION for all [place] numbers 10/20/2018

## 2018-10-20 NOTE — ED Notes (Signed)
Nurse navigator spoke with patient and had her talk with brother who is waiting in the parking lot. Repositioned patient.

## 2018-10-20 NOTE — ED Notes (Signed)
Potassium 2.6 

## 2018-10-20 NOTE — ED Notes (Signed)
Attempted to turn pt for MD to see R buttock where she c/o pain and to clean area.  However, pt stated it was to painful and insisted staff put her back down.

## 2018-10-21 ENCOUNTER — Ambulatory Visit: Payer: 59 | Admitting: Podiatry

## 2018-10-21 DIAGNOSIS — R7881 Bacteremia: Secondary | ICD-10-CM

## 2018-10-21 DIAGNOSIS — A4151 Sepsis due to Escherichia coli [E. coli]: Principal | ICD-10-CM

## 2018-10-21 LAB — BASIC METABOLIC PANEL
Anion gap: 9 (ref 5–15)
Anion gap: 9 (ref 5–15)
BUN: 15 mg/dL (ref 8–23)
BUN: 11 mg/dL (ref 8–23)
CO2: 28 mmol/L (ref 22–32)
CO2: 27 mmol/L (ref 22–32)
Calcium: 7.8 mg/dL — ABNORMAL LOW (ref 8.9–10.3)
Calcium: 7.6 mg/dL — ABNORMAL LOW (ref 8.9–10.3)
Chloride: 98 mmol/L (ref 98–111)
Chloride: 99 mmol/L (ref 98–111)
Creatinine, Ser: 0.95 mg/dL (ref 0.44–1.00)
Creatinine, Ser: 0.82 mg/dL (ref 0.44–1.00)
GFR calc Af Amer: >60 mL/min (ref >60)
GFR calc Af Amer: >60 mL/min (ref >60)
GFR calc non Af Amer: >60 mL/min (ref >60)
GFR calc non Af Amer: >60 mL/min (ref >60)
Glucose, Bld: 99 mg/dL (ref 70–99)
Glucose, Bld: 116 mg/dL — ABNORMAL HIGH (ref 70–99)
Potassium: 2.6 mmol/L — CL (ref 3.5–5.1)
Potassium: 3 mmol/L — ABNORMAL LOW (ref 3.5–5.1)
Sodium: 135 mmol/L (ref 135–145)
Sodium: 135 mmol/L (ref 135–145)

## 2018-10-21 LAB — CBC
HCT: 32.2 % — ABNORMAL LOW (ref 36.0–46.0)
HCT: 30.4 % — ABNORMAL LOW (ref 36.0–46.0)
Hemoglobin: 9.9 g/dL — ABNORMAL LOW (ref 12.0–15.0)
Hemoglobin: 9.2 g/dL — ABNORMAL LOW (ref 12.0–15.0)
MCH: 27 pg (ref 26.0–34.0)
MCH: 26.7 pg (ref 26.0–34.0)
MCHC: 30.7 g/dL (ref 30.0–36.0)
MCHC: 30.3 g/dL (ref 30.0–36.0)
MCV: 88 fL (ref 80.0–100.0)
MCV: 88.1 fL (ref 80.0–100.0)
Platelets: 81 10*3/uL — ABNORMAL LOW (ref 150–400)
Platelets: UNDETERMINED 10*3/uL (ref 150–400)
RBC: 3.66 MIL/uL — ABNORMAL LOW (ref 3.87–5.11)
RBC: 3.45 MIL/uL — ABNORMAL LOW (ref 3.87–5.11)
RDW: 17.4 % — ABNORMAL HIGH (ref 11.5–15.5)
RDW: 17.2 % — ABNORMAL HIGH (ref 11.5–15.5)
WBC: 13.6 10*3/uL — ABNORMAL HIGH (ref 4.0–10.5)
WBC: 10.1 10*3/uL (ref 4.0–10.5)
nRBC: 0 % (ref 0.0–0.2)
nRBC: 0 % (ref 0.0–0.2)

## 2018-10-21 LAB — BLOOD CULTURE ID PANEL (REFLEXED)

## 2018-10-21 LAB — IRON AND TIBC
Iron: 11 ug/dL — ABNORMAL LOW (ref 28–170)
Saturation Ratios: 5 % — ABNORMAL LOW (ref 10.4–31.8)
TIBC: 239 ug/dL — ABNORMAL LOW (ref 250–450)
UIBC: 228 ug/dL

## 2018-10-21 LAB — TYPE AND SCREEN
ABO/RH(D): O POS
Antibody Screen: NEGATIVE
Unit division: 0

## 2018-10-21 LAB — HEPARIN LEVEL (UNFRACTIONATED)
Heparin Unfractionated: <0.1 IU/mL — ABNORMAL LOW (ref 0.30–0.70)
Heparin Unfractionated: <0.1 IU/mL — ABNORMAL LOW (ref 0.30–0.70)
Heparin Unfractionated: <0.1 IU/mL — ABNORMAL LOW (ref 0.30–0.70)

## 2018-10-21 LAB — MRSA PCR SCREENING: MRSA by PCR: POSITIVE — AB

## 2018-10-21 LAB — BPAM RBC
Blood Product Expiration Date: 202006232359
ISSUE DATE / TIME: 202005271513
Unit Type and Rh: 5100

## 2018-10-21 LAB — FERRITIN: Ferritin: 1248 ng/mL — ABNORMAL HIGH (ref 11–307)

## 2018-10-21 LAB — APTT: aPTT: 44 seconds — ABNORMAL HIGH (ref 24–36)

## 2018-10-21 LAB — MAGNESIUM: Magnesium: 2.2 mg/dL (ref 1.7–2.4)

## 2018-10-21 LAB — VITAMIN B12: Vitamin B-12: 1019 pg/mL — ABNORMAL HIGH (ref 180–914)

## 2018-10-21 MED ORDER — HYDROCODONE-ACETAMINOPHEN 5-325 MG PO TABS
1.0000 | ORAL_TABLET | Freq: Once | ORAL | Status: AC
  Administered 2018-10-21: 1 via ORAL
  Filled 2018-10-21: qty 1

## 2018-10-21 MED ORDER — HEPARIN BOLUS VIA INFUSION
2000.0000 [IU] | Freq: Once | INTRAVENOUS | Status: AC
  Administered 2018-10-21: 2000 [IU] via INTRAVENOUS
  Filled 2018-10-21: qty 2000

## 2018-10-21 MED ORDER — SODIUM CHLORIDE 0.9 % IV SOLN
2.0000 g | INTRAVENOUS | Status: DC
  Administered 2018-10-22 – 2018-10-23 (×2): 2 g via INTRAVENOUS
  Filled 2018-10-21 (×3): qty 20

## 2018-10-21 MED ORDER — MUPIROCIN 2 % EX OINT
1.0000 "application " | TOPICAL_OINTMENT | Freq: Two times a day (BID) | CUTANEOUS | Status: DC
  Administered 2018-10-21 – 2018-10-24 (×7): 1 via NASAL
  Filled 2018-10-21 (×3): qty 22

## 2018-10-21 MED ORDER — POTASSIUM CHLORIDE CRYS ER 20 MEQ PO TBCR
40.0000 meq | EXTENDED_RELEASE_TABLET | Freq: Two times a day (BID) | ORAL | Status: DC
  Administered 2018-10-21 (×2): 40 meq via ORAL
  Filled 2018-10-21 (×3): qty 2

## 2018-10-21 MED ORDER — CHLORHEXIDINE GLUCONATE CLOTH 2 % EX PADS
6.0000 | MEDICATED_PAD | Freq: Every day | CUTANEOUS | Status: DC
  Administered 2018-10-22 – 2018-10-24 (×3): 6 via TOPICAL

## 2018-10-21 MED ORDER — HYDROCODONE-ACETAMINOPHEN 7.5-325 MG PO TABS
1.0000 | ORAL_TABLET | Freq: Four times a day (QID) | ORAL | Status: DC | PRN
  Administered 2018-10-21 – 2018-10-23 (×6): 1 via ORAL
  Filled 2018-10-21 (×6): qty 1

## 2018-10-21 NOTE — Progress Notes (Signed)
ANTICOAGULATION CONSULT NOTE - Follow Up Consult  Pharmacy Consult for heparin Indication: atrial fibrillation   Labs: Recent Labs    10/20/18 1110 10/20/18 2114 10/21/18 0416 10/21/18 1242  HGB 6.7* 9.7* 9.9* 9.2*  HCT 22.3* 31.5* 32.2* 30.4*  PLT 238 PLATELET CLUMPS NOTED ON SMEAR, COUNT APPEARS ADEQUATE 81* PLATELET CLUMPS NOTED ON SMEAR, UNABLE TO ESTIMATE  APTT  --  43* 44*  --   HEPARINUNFRC  --  <0.10* <0.10* <0.10*  CREATININE 1.12*  --  0.95 0.82  TROPONINI <0.03  --   --   --     Assessment: 62yo female undetectable on heparin with initial dosing while Eliquis on hold; no gtt issues or signs of bleeding per RN.  Goal of Therapy:  Heparin level 0.3-0.7 units/ml   Plan:  Heparin to 1800 units / hr 8 hour heparin level  Thank you Anette Guarneri, PharmD [phone]   10/21/2018,1:49 PM

## 2018-10-21 NOTE — Progress Notes (Signed)
[name]  PROGRESS NOTE    [name]  TIR:[id number] DOB: 19-Mar-1956 DOA: 10/20/2018 PCP: Debbrah Alar, NP   Brief Narrative:   [name] is a 63 y.o. female with medical history significant for iron deficiency anemia, pernicious anemia, gastric bypass and absorption, chronic atrial fibrillation on Eliquis who presents on 10/20/2018 with complaint of nausea, vomiting, back pain.  She first noticed back pain several days ago that she states is consistent with her prior episode of kidney stones.  She did not notice any blood in her urine or pain with urination at that time.  If they worsen with 4 episodes of nonbilious, nonbloody vomiting a day prior to presenting to the ED.  Without any abdominal pain, no fevers, no chills, no recent sick contacts, no new medications, no recent travel.  This was followed by diarrhea that started this morning without any blood or black tarry stool.  She felt much more weak than usual while getting out of the shower and slid out of the chair onto the floor without any head trauma or loss of consciousness.  Of note patient has history of recurrent kidney stones, last evaluated by [facility] on 01/2018 for ureteroscopy for evaluation of stent (which was found to have passed at that time).Of note in past kidney stones deemed releated to hypocitraturia and hyperoxaluria. She denies any new symptoms until recently   Assessment & Plan:   Active Problems:   Hypothyroidism   OBESITY, MORBID   Iron deficiency anemia secondary to malabsorption    Leukocytosis   Essential hypertension   Asthma   GERD   Atrial fibrillation (HCC)   Hypokalemia   Sepsis (HCC)   E. coli bacteremia/septic shock     - septic at presentation: fever 101.2, hypotensive, WBC 25.6     - started on vanc/cefepime     - Bld Cx w/ E coli (BCID positive); escalated to rocephin     - temperature curve better; continue     - question if nephrolithiasis was source     - will  continue abx for 14 days total  Symptomatic anemia     - no evidence of bleed     - normocytic     - s/p 1unit pRBCs     - Hgb 9.2     - history of Fe deficiency  A fib     - continue diltiazem as SBP tolerates     - on IV heparin d/t chronic anemia; will continue through tonight, if Hgb stays stable, consider resuming eliquis in AM     - continue on tele  Chronic HFpEF     - currently euvolemic     - reassess in AM  COPD     - continue dulera  Hypokalemia     - K+ tablets 69mEQ BID; repeat labs in AM  Fibromyaglia     - APAP, hydrocodone  Debility     - PT consult  DVT prophylaxis: Heparin Code Status: DNR   Disposition Plan: TBD    Antimicrobials:   Rocephin 2g q 24h    Subjective: "I can't wait to get out of this bed."  Objective: Vitals:   10/21/18 0838 10/21/18 0839 10/21/18 0915 10/21/18 1129  BP: (!) 144/133 (!) 85/32 (!) 106/50 (!) 106/46  Pulse: (!) 55 (!) 55 78 75  Resp: 16 16  17   Temp: 98.8 F (37.1 C) 98.8 F (37.1 C)  98.9 F (37.2 C)  TempSrc: Oral Oral  Oral  SpO2: 96% 96%  97%  Weight:      Height:        Intake/Output Summary (Last 24 hours) at 10/21/2018 1455 Last data filed at 10/21/2018 1139 Gross per 24 hour  Intake 1473.43 ml  Output 1600 ml  Net -126.57 ml   Filed Weights   10/20/18 1053 10/21/18 0549  Weight: 129.3 kg 130 kg    Examination:  General exam: 63 y.o. female Appears calm and comfortable  Respiratory system: Clear to auscultation. Respiratory effort normal. Cardiovascular system: S1 & S2 heard, RRR. No JVD, murmurs, rubs, gallops or clicks. No pedal edema. Gastrointestinal system: Abdomen is nondistended, soft and nontender. No organomegaly or masses felt. Normal bowel sounds heard. Central nervous system: Alert and oriented. No focal neurological deficits. Extremities: Symmetric 5 x 5 power. Skin: No rashes, lesions or ulcers    Data Reviewed: I have personally reviewed following labs and imaging  studies.  CBC: Recent Labs  Lab 10/20/18 1110 10/20/18 2114 10/21/18 0416 10/21/18 1242  WBC 25.6* 14.3* 13.6* 10.1  HGB 6.7* 9.7* 9.9* 9.2*  HCT 22.3* 31.5* 32.2* 30.4*  MCV 88.1 86.8 88.0 88.1  PLT 238 PLATELET CLUMPS NOTED ON SMEAR, COUNT APPEARS ADEQUATE 81* PLATELET CLUMPS NOTED ON SMEAR, UNABLE TO ESTIMATE   Basic Metabolic Panel: Recent Labs  Lab 10/20/18 1110 10/21/18 0416 10/21/18 1242  NA 133* 135 135  K 2.6* 2.6* 3.0*  CL 90* 98 99  CO2 27 28 27   GLUCOSE 133* 99 116*  BUN 21 15 11   CREATININE 1.12* 0.95 0.82  CALCIUM 8.7* 7.8* 7.6*  MG  --  2.2  --    GFR: Estimated Creatinine Clearance: 96.4 mL/min (by C-G formula based on SCr of 0.82 mg/dL). Liver Function Tests: Recent Labs  Lab 10/20/18 1110  AST 25  ALT 32  ALKPHOS 122  BILITOT 1.5*  PROT 7.6  ALBUMIN 3.5   Recent Labs  Lab 10/20/18 1110  LIPASE 28   No results for input(s): AMMONIA in the last 168 hours. Coagulation Profile: No results for input(s): INR, PROTIME in the last 168 hours. Cardiac Enzymes: Recent Labs  Lab 10/20/18 1110  TROPONINI <0.03   BNP (last 3 results) No results for input(s): PROBNP in the last 8760 hours. HbA1C: No results for input(s): HGBA1C in the last 72 hours. CBG: Recent Labs  Lab 10/20/18 1053  GLUCAP 130*   Lipid Profile: No results for input(s): CHOL, HDL, LDLCALC, TRIG, CHOLHDL, LDLDIRECT in the last 72 hours. Thyroid Function Tests: Recent Labs    10/20/18 2114  TSH 0.359   Anemia Panel: Recent Labs    10/20/18 2114  VITAMINB12 1,019*  FERRITIN 1,248*  TIBC 239*  IRON 11*   Sepsis Labs: No results for input(s): PROCALCITON, LATICACIDVEN in the last 168 hours.  Recent Results (from the past 240 hour(s))  Blood culture (routine x 2)     Status: None (Preliminary result)   Collection Time: 10/20/18 11:10 AM  Result Value Ref Range Status   Specimen Description BLOOD LEFT HAND  Final   Special Requests   Final    BOTTLES DRAWN  AEROBIC AND ANAEROBIC Blood Culture adequate volume   Culture  Setup Time   Final    GRAM NEGATIVE RODS AEROBIC BOTTLE ONLY CRITICAL RESULT CALLED TO, READ BACK BY AND VERIFIED WITH: Shellee Milo PHARMD, AT 0093 10/21/18 BY Rush Landmark Performed at [facility], [place] [address], [place], [place] [address]    Culture  GRAM NEGATIVE RODS  Final   Report Status PENDING  Incomplete  Blood Culture ID Panel (Reflexed)     Status: Abnormal   Collection Time: 10/20/18 11:10 AM  Result Value Ref Range Status   Enterococcus species NOT DETECTED NOT DETECTED Final   Listeria monocytogenes NOT DETECTED NOT DETECTED Final   Staphylococcus species NOT DETECTED NOT DETECTED Final   Staphylococcus aureus (BCID) NOT DETECTED NOT DETECTED Final   Streptococcus species NOT DETECTED NOT DETECTED Final   Streptococcus agalactiae NOT DETECTED NOT DETECTED Final   Streptococcus pneumoniae NOT DETECTED NOT DETECTED Final   Streptococcus pyogenes NOT DETECTED NOT DETECTED Final   Acinetobacter baumannii NOT DETECTED NOT DETECTED Final   Enterobacteriaceae species DETECTED (A) NOT DETECTED Final    Comment: Enterobacteriaceae represent a large family of gram-negative bacteria, not a single organism. CRITICAL RESULT CALLED TO, READ BACK BY AND VERIFIED WITH: L. SEAY PHARMD, AT 0705 10/21/18 BY D. VANHOOK    Enterobacter cloacae complex NOT DETECTED NOT DETECTED Final   Escherichia coli DETECTED (A) NOT DETECTED Final    Comment: CRITICAL RESULT CALLED TO, READ BACK BY AND VERIFIED WITH: L. SEAY PHARMD, AT 7341 10/21/18 BY D. VANHOOK    Klebsiella oxytoca NOT DETECTED NOT DETECTED Final   Klebsiella pneumoniae NOT DETECTED NOT DETECTED Final   Proteus species NOT DETECTED NOT DETECTED Final   Serratia marcescens NOT DETECTED NOT DETECTED Final   Carbapenem resistance NOT DETECTED NOT DETECTED Final   Haemophilus influenzae NOT DETECTED NOT DETECTED Final   Neisseria meningitidis NOT DETECTED NOT DETECTED Final     Pseudomonas aeruginosa NOT DETECTED NOT DETECTED Final   Candida albicans NOT DETECTED NOT DETECTED Final   Candida glabrata NOT DETECTED NOT DETECTED Final   Candida krusei NOT DETECTED NOT DETECTED Final   Candida parapsilosis NOT DETECTED NOT DETECTED Final   Candida tropicalis NOT DETECTED NOT DETECTED Final    Comment: Performed at [facility], [place] [address], [place], [place] [address]  Blood culture (routine x 2)     Status: None (Preliminary result)   Collection Time: 10/20/18 12:55 PM  Result Value Ref Range Status   Specimen Description BLOOD RIGHT ANTECUBITAL  Final   Special Requests   Final    BOTTLES DRAWN AEROBIC AND ANAEROBIC Blood Culture adequate volume   Culture   Final    NO GROWTH 1 DAY Performed at [facility], [place] [address] [place], [place] [address]    Report Status PENDING  Incomplete  SARS Coronavirus 2 (CEPHEID - Performed in [place] hospital lab), Hosp Order     Status: None   Collection Time: 10/20/18  1:28 PM  Result Value Ref Range Status   SARS Coronavirus 2 NEGATIVE NEGATIVE Final    Comment: (NOTE) If result is NEGATIVE SARS-CoV-2 target nucleic acids are NOT DETECTED. The SARS-CoV-2 RNA is generally detectable in upper and lower  respiratory specimens during the acute phase of infection. The lowest  concentration of SARS-CoV-2 viral copies this assay can detect is 250  copies / mL. A negative result does not preclude SARS-CoV-2 infection  and should not be used as the sole basis for treatment or other  patient management decisions.  A negative result may occur with  improper specimen collection / handling, submission of specimen other  than nasopharyngeal swab, presence of viral mutation(s) within the  areas targeted by this assay, and inadequate number of viral copies  (<250 copies / mL). A negative result  must be combined with clinical  observations, patient history, and epidemiological information. If result is  POSITIVE SARS-CoV-2 target nucleic acids are DETECTED. The SARS-CoV-2 RNA is generally detectable in upper and lower  respiratory specimens dur ing the acute phase of infection.  Positive  results are indicative of active infection with SARS-CoV-2.  Clinical  correlation with patient history and other diagnostic information is  necessary to determine patient infection status.  Positive results do  not rule out bacterial infection or co-infection with other viruses. If result is PRESUMPTIVE POSTIVE SARS-CoV-2 nucleic acids MAY BE PRESENT.   A presumptive positive result was obtained on the submitted specimen  and confirmed on repeat testing.  While 2019 novel coronavirus  (SARS-CoV-2) nucleic acids may be present in the submitted sample  additional confirmatory testing may be necessary for epidemiological  and / or clinical management purposes  to differentiate between  SARS-CoV-2 and other Sarbecovirus currently known to infect humans.  If clinically indicated additional testing with an alternate test  methodology [phone]) is advised. The SARS-CoV-2 RNA is generally  detectable in upper and lower respiratory sp ecimens during the acute  phase of infection. The expected result is Negative. Fact Sheet for Patients:  StrictlyIdeas.no Fact Sheet for Healthcare Providers: BankingDealers.co.za This test is not yet approved or cleared by the [place] FDA and has been authorized for detection and/or diagnosis of SARS-CoV-2 by FDA under an Emergency Use Authorization (EUA).  This EUA will remain in effect (meaning this test can be used) for the duration of the COVID-19 declaration under Section 564(b)(1) of the Act, 21 U.S.C. section 360bbb-3(b)(1), unless the authorization is terminated or revoked sooner. Performed at [facility], [place] [address] [place], [place] [address]   MRSA PCR Screening     Status: Abnormal   Collection  Time: 10/21/18  9:33 AM  Result Value Ref Range Status   MRSA by PCR POSITIVE (A) NEGATIVE Final    Comment:        The GeneXpert MRSA Assay (FDA approved for NASAL specimens only), is one component of a comprehensive MRSA colonization surveillance program. It is not intended to diagnose MRSA infection nor to guide or monitor treatment for MRSA infections. RESULT CALLED TO, READ BACK BY AND VERIFIED WITH: Tenny Craw RN 11:45 10/21/18 ([email or web address]) Performed at [facility], [place] [address], [place], [place] [address]          Radiology Studies: Dg Chest 1 View  Result Date: 10/20/2018 CLINICAL DATA:  Weakness EXAM: CHEST  1 VIEW COMPARISON:  06/30/2018 FINDINGS: Heart and mediastinal contours are within normal limits. No focal opacities or effusions. No acute bony abnormality. IMPRESSION: No active disease. Electronically Signed   By: Rolm Baptise M.D.   On: 10/20/2018 11:38   Ct Abdomen Pelvis W Contrast  Result Date: 10/20/2018 CLINICAL DATA:  Generalized weakness, vomiting, diarrhea EXAM: CT ABDOMEN AND PELVIS WITH CONTRAST TECHNIQUE: Multidetector CT imaging of the abdomen and pelvis was performed using the standard protocol following bolus administration of intravenous contrast. CONTRAST:  118mL OMNIPAQUE IOHEXOL 300 MG/ML  SOLN COMPARISON:  03/26/2018 FINDINGS: Lower chest: No acute abnormality. Hepatobiliary: No focal liver abnormality is seen. Hepatic steatosis. No gallstones, gallbladder wall thickening, or biliary dilatation. Pancreas: Unremarkable. No pancreatic ductal dilatation or surrounding inflammatory changes. Spleen: Normal in size without focal abnormality. Adrenals/Urinary Tract: Adrenal glands are unremarkable. Multiple bilateral nonobstructive renal calculi. There is a 2 mm calculus in the dependent urinary bladder (series 3, image 85).  There is mild right hydronephrosis and hydroureter with stranding about the right kidney. Stomach/Bowel: Stomach is within normal  limits. Appendix appears normal. No evidence of bowel wall thickening, distention, or inflammatory changes. There is extensive small bowel and colonic diverticulosis. Vascular/Lymphatic: Calcific atherosclerosis. No enlarged abdominal or pelvic lymph nodes. Reproductive: No mass or other abnormality. IUD present in the endometrial cavity. Other: No abdominal wall hernia or abnormality. No abdominopelvic ascites. Musculoskeletal: No acute or significant osseous findings. Disc degenerative disease and ankylosis of the spine IMPRESSION: 1. Multiple bilateral nonobstructive renal calculi. There is a 2 mm calculus in the dependent urinary bladder (series 3, image 85). There is mild right hydronephrosis and hydroureter with stranding about the right kidney. Findings are consistent with a recently passed right-sided ureteral calculus. 2.  Chronic and incidental findings as detailed above. Electronically Signed   By: Eddie Candle M.D.   On: 10/20/2018 14:49        Scheduled Meds:  [START ON 10/22/2018] Chlorhexidine Gluconate Cloth  6 each Topical Q0600   diltiazem  240 mg Oral Daily   fluticasone  2 spray Each Nare Daily   levothyroxine  350 mcg Oral Q0600   loratadine  10 mg Oral QPM   mometasone-formoterol  2 puff Inhalation BID   montelukast  10 mg Oral QHS   mupirocin ointment  1 application Nasal BID   nystatin cream  1 application Topical BID   pantoprazole  40 mg Oral Daily   potassium chloride  40 mEq Oral BID   traMADol  50 mg Oral Once   Continuous Infusions:  sodium chloride     sodium chloride 100 mL/hr at 10/21/18 1000   cefTRIAXone (ROCEPHIN)  IV     heparin 1,600 Units/hr (10/21/18 1000)     LOS: 1 day    Time spent: 35 minutes spent in the coordination of care today.    Jonnie Finner, DO [facility] Pager [phone]  If 7PM-7AM, please contact night-coverage [email or web address] Password [facility] 10/21/2018, 2:55 PM

## 2018-10-21 NOTE — Progress Notes (Signed)
ANTICOAGULATION CONSULT NOTE - Follow Up Consult  Pharmacy Consult for heparin Indication: atrial fibrillation   Labs: Recent Labs    10/20/18 1110 10/20/18 2114 10/21/18 0416  HGB 6.7* 9.7*  --   HCT 22.3* 31.5*  --   PLT 238 PLATELET CLUMPS NOTED ON SMEAR, COUNT APPEARS ADEQUATE  --   APTT  --  43* 44*  HEPARINUNFRC  --  <0.10* <0.10*  CREATININE 1.12*  --   --   TROPONINI <0.03  --   --     Assessment: 62yo female undetectable on heparin with initial dosing while Eliquis on hold; no gtt issues or signs of bleeding per RN.  Goal of Therapy:  Heparin level 0.3-0.7 units/ml   Plan:  Will give small heparin bolus of 2000 units and increase heparin gtt by 4 units/kgABW/hr to 1600 units/hr and check level in 6 hours.    [name], PharmD, BCPS  10/21/2018,6:47 AM

## 2018-10-21 NOTE — Plan of Care (Signed)

## 2018-10-21 NOTE — Progress Notes (Signed)
**Note De-Identified Jamie Obfuscation** PHARMACY - PHYSICIAN COMMUNICATION CRITICAL VALUE ALERT - BLOOD CULTURE IDENTIFICATION (BCID)  [place] [name] is an 63 y.o. female who presented to [facility] on 10/20/2018 with a chief complaint of weakness.  Assessment:  1/4 BC with gnr  Name of physician (or Provider) Contacted: Marylyn Ishihara  Current antibiotics: cefepime and vanc  Changes to prescribed antibiotics recommended:  Rec narrow to rocephin 2gm q24  Results for orders placed or performed during the hospital encounter of 10/20/18  Blood Culture ID Panel (Reflexed) (Collected: 10/20/2018 11:10 AM)  Result Value Ref Range   Enterococcus species NOT DETECTED NOT DETECTED   Listeria monocytogenes NOT DETECTED NOT DETECTED   Staphylococcus species NOT DETECTED NOT DETECTED   Staphylococcus aureus (BCID) NOT DETECTED NOT DETECTED   Streptococcus species NOT DETECTED NOT DETECTED   Streptococcus agalactiae NOT DETECTED NOT DETECTED   Streptococcus pneumoniae NOT DETECTED NOT DETECTED   Streptococcus pyogenes NOT DETECTED NOT DETECTED   Acinetobacter baumannii NOT DETECTED NOT DETECTED   Enterobacteriaceae species DETECTED (A) NOT DETECTED   Enterobacter cloacae complex NOT DETECTED NOT DETECTED   Escherichia coli DETECTED (A) NOT DETECTED   Klebsiella oxytoca NOT DETECTED NOT DETECTED   Klebsiella pneumoniae NOT DETECTED NOT DETECTED   Proteus species NOT DETECTED NOT DETECTED   Serratia marcescens NOT DETECTED NOT DETECTED   Carbapenem resistance NOT DETECTED NOT DETECTED   Haemophilus influenzae NOT DETECTED NOT DETECTED   Neisseria meningitidis NOT DETECTED NOT DETECTED   Pseudomonas aeruginosa NOT DETECTED NOT DETECTED   Candida albicans NOT DETECTED NOT DETECTED   Candida glabrata NOT DETECTED NOT DETECTED   Candida krusei NOT DETECTED NOT DETECTED   Candida parapsilosis NOT DETECTED NOT DETECTED   Candida tropicalis NOT DETECTED NOT DETECTED    Excell Seltzer [place] 10/21/2018  7:06 AM

## 2018-10-22 ENCOUNTER — Ambulatory Visit: Payer: 59 | Admitting: Family

## 2018-10-22 ENCOUNTER — Other Ambulatory Visit: Payer: 59

## 2018-10-22 LAB — CBC
HCT: 31.1 % — ABNORMAL LOW (ref 36.0–46.0)
Hemoglobin: 9.4 g/dL — ABNORMAL LOW (ref 12.0–15.0)
MCH: 26.7 pg (ref 26.0–34.0)
MCHC: 30.2 g/dL (ref 30.0–36.0)
MCV: 88.4 fL (ref 80.0–100.0)
Platelets: UNDETERMINED 10*3/uL (ref 150–400)
RBC: 3.52 MIL/uL — ABNORMAL LOW (ref 3.87–5.11)
RDW: 17.2 % — ABNORMAL HIGH (ref 11.5–15.5)
WBC: 7 10*3/uL (ref 4.0–10.5)
nRBC: 0 % (ref 0.0–0.2)

## 2018-10-22 LAB — RENAL FUNCTION PANEL
Albumin: 2.4 g/dL — ABNORMAL LOW (ref 3.5–5.0)
Anion gap: 10 (ref 5–15)
BUN: 9 mg/dL (ref 8–23)
CO2: 23 mmol/L (ref 22–32)
Calcium: 7.6 mg/dL — ABNORMAL LOW (ref 8.9–10.3)
Chloride: 102 mmol/L (ref 98–111)
Creatinine, Ser: 0.79 mg/dL (ref 0.44–1.00)
GFR calc Af Amer: >60 mL/min (ref >60)
GFR calc non Af Amer: >60 mL/min (ref >60)
Glucose, Bld: 88 mg/dL (ref 70–99)
Phosphorus: 3.5 mg/dL (ref 2.5–4.6)
Potassium: 3.1 mmol/L — ABNORMAL LOW (ref 3.5–5.1)
Sodium: 135 mmol/L (ref 135–145)

## 2018-10-22 LAB — HEPARIN LEVEL (UNFRACTIONATED): Heparin Unfractionated: <0.1 IU/mL — ABNORMAL LOW (ref 0.30–0.70)

## 2018-10-22 LAB — MAGNESIUM: Magnesium: 2.2 mg/dL (ref 1.7–2.4)

## 2018-10-22 MED ORDER — APIXABAN 5 MG PO TABS
5.0000 mg | ORAL_TABLET | Freq: Two times a day (BID) | ORAL | Status: DC
  Administered 2018-10-22 – 2018-10-24 (×5): 5 mg via ORAL
  Filled 2018-10-22 (×5): qty 1

## 2018-10-22 MED ORDER — POTASSIUM CHLORIDE 20 MEQ/15ML (10%) PO SOLN
40.0000 meq | Freq: Two times a day (BID) | ORAL | Status: DC
  Administered 2018-10-22 – 2018-10-24 (×5): 40 meq via ORAL
  Filled 2018-10-22 (×5): qty 30

## 2018-10-22 NOTE — TOC Progression Note (Signed)
Transition of Care [facility]) - Progression Note    Patient Details  Name: [name] MRN: [id number] Date of Birth: 1955/12/03  [facility] of [facility]) CM/SW Contact  Royston Bake, RN Phone Number: 10/22/2018, 2:50 PM  Clinical Narrative:    CM talked to patient at the bedside for [facility] choices, pt requested [facility] for [place]; Tommi Rumps with [facility] called for arrangements.   Expected Discharge Plan: [place] Barriers to Discharge: No Barriers Identified  Expected Discharge Plan and Services Expected Discharge Plan: [place] In-house Referral: NA Discharge Planning Services: CM Consult Post Acute Care Choice: NA Living arrangements for the past 2 months: Single Family Home                 DME Arranged: N/A DME Agency: NA       HH Arranged: PT HH Agency: [facility] Date [place]: 10/22/18 Time [place]: 3704 Representative spoke with at [place]: [place] ([place]) Interventions    Readmission Risk Interventions No flowsheet data found.

## 2018-10-22 NOTE — Progress Notes (Signed)
ANTICOAGULATION CONSULT NOTE - Follow Up Consult  Pharmacy Consult for heparin Indication: atrial fibrillation   Labs: Recent Labs    10/20/18 1110  10/20/18 2114 10/21/18 0416 10/21/18 1242 10/21/18 2209  HGB 6.7*  --  9.7* 9.9* 9.2*  --   HCT 22.3*  --  31.5* 32.2* 30.4*  --   PLT 238  --  PLATELET CLUMPS NOTED ON SMEAR, COUNT APPEARS ADEQUATE 81* PLATELET CLUMPS NOTED ON SMEAR, UNABLE TO ESTIMATE  --   APTT  --   --  43* 44*  --   --   HEPARINUNFRC  --    < > <0.10* <0.10* <0.10* <0.10*  CREATININE 1.12*  --   --  0.95 0.82  --   TROPONINI <0.03  --   --   --   --   --    < > = values in this interval not displayed.    Assessment: 64yo female undetectable on heparin  while Eliquis on hold; no gtt issues or signs of bleeding per RN.  Goal of Therapy:  Heparin level 0.3-0.7 units/ml   Plan:  Increase heparin to 2100 units / hr, no bolus due to anemia Check heparin level in 6-8 hours   Thank you Excell Seltzer, PharmD   10/22/2018,12:05 AM

## 2018-10-22 NOTE — Evaluation (Signed)
Physical Therapy Evaluation Patient Details Name: [name] MRN: [id number] DOB: [birth year] Today's Date: 10/22/2018   History of Present Illness  63 y.o. female with medical history significant for iron deficiency anemia, pernicious anemia, gastric bypass and absorption, chronic atrial fibrillation on Eliquis who presents on 10/20/2018 with complaint of nausea, vomiting, weakness, and back pain.     Clinical Impression  Pt admitted with above diagnosis. Pt currently with functional limitations due to the deficits listed below (see PT Problem List). PTA pt lived at home alone, mod I mobility using rollator. On eval, pt required min assist bed mobility and min assist transfers with RW. Unable to progress with ambulation due to weakness and fear of falling. Pt will benefit from skilled PT to increase their independence and safety with mobility to allow discharge to the venue listed below.  Pt declining SNF due to bad experience in the past.      Follow Up Recommendations Home health PT    Equipment Recommendations  None recommended by PT    Recommendations for Other Services       Precautions / Restrictions Precautions Precautions: Fall      Mobility  Bed Mobility Overal bed mobility: Needs Assistance Bed Mobility: Supine to Sit     Supine to sit: Min assist;HOB elevated     General bed mobility comments: +rail, cues for sequencing, increased time and effort  Transfers Overall transfer level: Needs assistance Equipment used: Rolling walker (2 wheeled) Transfers: Sit to/from [facility] Sit to Stand: Min assist Stand pivot transfers: Min assist       General transfer comment: cues for hand placement, assist to power up, increased time to stabilize initial standing balance and attain full upright stance  Ambulation/Gait             General Gait Details: unable due to weakness and fear of falling  Stairs            Wheelchair Mobility     Modified Rankin (Stroke Patients Only)       Balance Overall balance assessment: Needs assistance Sitting-balance support: No upper extremity supported;Feet supported Sitting balance-Leahy Scale: Good     Standing balance support: Bilateral upper extremity supported;During functional activity Standing balance-Leahy Scale: Poor Standing balance comment: reliant on UE and external support                             Pertinent Vitals/Pain Pain Assessment: Faces Faces Pain Scale: Hurts little more Pain Location: back Pain Descriptors / Indicators: Aching;Sore Pain Intervention(s): Monitored during session;Limited activity within patient's tolerance;Repositioned    Home Living Family/patient expects to be discharged to:: Private residence Living Arrangements: Alone Available Help at Discharge: Family;Available PRN/intermittently Type of Home: House Home Access: Ramped entrance     Home Layout: Two level;Able to live on main level with bedroom/bathroom Home Equipment: Walker - 4 wheels;Walker - standard;Shower seat;Grab bars - tub/shower      Prior Function Level of Independence: Independent with assistive device(s)         Comments: Uses Rollator in the community and uses a standard walker for increased support in the shower     Hand Dominance   Dominant Hand: Right    Extremity/Trunk Assessment   Upper Extremity Assessment Upper Extremity Assessment: Overall WFL for tasks assessed    Lower Extremity Assessment Lower Extremity Assessment: Generalized weakness    Cervical / Trunk Assessment Cervical / Trunk Assessment: Kyphotic  Communication   Communication: No difficulties  Cognition Arousal/Alertness: Awake/alert Behavior During Therapy: WFL for tasks assessed/performed Overall Cognitive Status: Within Functional Limits for tasks assessed                                        General Comments      Exercises      Assessment/Plan    PT Assessment Patient needs continued PT services  PT Problem List Decreased strength;Decreased balance;Pain;Decreased mobility;Obesity;Decreased activity tolerance       PT Treatment Interventions Functional mobility training;Balance training;Patient/family education;Gait training;Therapeutic activities;Neuromuscular re-education;Therapeutic exercise    PT Goals (Current goals can be found in the Care Plan section)  Acute Rehab PT Goals Patient Stated Goal: home PT Goal Formulation: With patient Time For Goal Achievement: 11/05/18 Potential to Achieve Goals: Fair    Frequency Min 3X/week   Barriers to discharge        Co-evaluation               AM-PAC PT "6 Clicks" Mobility  Outcome Measure Help needed turning from your back to your side while in a flat bed without using bedrails?: A Little Help needed moving from lying on your back to sitting on the side of a flat bed without using bedrails?: A Lot Help needed moving to and from a bed to a chair (including a wheelchair)?: A Little Help needed standing up from a chair using your arms (e.g., wheelchair or bedside chair)?: A Little Help needed to walk in hospital room?: A Lot Help needed climbing 3-5 steps with a railing? : A Lot 6 Click Score: 15    End of Session Equipment Utilized During Treatment: Gait belt Activity Tolerance: Patient tolerated treatment well Patient left: in chair;with call bell/phone within reach Nurse Communication: Mobility status PT Visit Diagnosis: Other abnormalities of gait and mobility (R26.89);Muscle weakness (generalized) (M62.81);Difficulty in walking, not elsewhere classified (R26.2)    Time: 1505-6979 PT Time Calculation (min) (ACUTE ONLY): 27 min   Charges:   PT Evaluation $PT Eval Moderate Complexity: 1 Mod PT Treatments $Therapeutic Activity: 8-22 mins        Lorrin Goodell, PT  Office # [phone] Pager [phone]   Lorriane Shire 10/22/2018,  12:20 PM

## 2018-10-22 NOTE — Progress Notes (Signed)
[name]  PROGRESS NOTE    [name]  ZSW:[id number] DOB: 1955/12/20 DOA: 10/20/2018 PCP: Debbrah Alar, NP   Brief Narrative:   Jamie Adkison Wilsonis a 63 y.o.femalewith medical history significant foriron deficiency anemia, pernicious anemia, gastric bypass and absorption, chronic atrial fibrillation on Eliquiswho presents on 5/27/2020with complaint of nausea, vomiting, back pain. She first noticed back pain several days ago that she states is consistent with her prior episode of kidney stones. She did not notice any blood in her urine or pain with urination at that time. If they worsen with 4 episodes of nonbilious, nonbloody vomiting a day prior to presenting to the ED. Without any abdominal pain, no fevers, no chills, no recent sick contacts, no new medications, no recent travel. This was followed by diarrhea that started this morning without any blood or black tarry stool. She felt much more weak than usual while getting out of the shower and slid out of the chair onto the floor without any head trauma or loss of consciousness.  Of note patient has history of recurrent kidney stones, last evaluated by [facility] on 01/2018 for ureteroscopy for evaluation of stent(whichwas found tohavepassed at that time).Of note in past kidney stones deemed releated to hypocitraturia and hyperoxaluria. She denies any new symptoms until recently   Assessment & Plan:   Active Problems:   Hypothyroidism   OBESITY, MORBID   Iron deficiency anemia secondary to malabsorption    Leukocytosis   Essential hypertension   Asthma   GERD   Atrial fibrillation (HCC)   Hypokalemia   Sepsis (HCC)   E. coli bacteremia/septic shock     - septic at presentation: fever 101.2, hypotensive, WBC 25.6     - started on vanc/cefepime     - Bld Cx w/ E coli (BCID positive); escalated to rocephin     - temperature curve better; continue     - question if nephrolithiasis was source     - will  continue abx for 14 days total; awaiting sensitivities for conversation to PO  Symptomatic anemia     - no evidence of bleed     - normocytic     - s/p 1unit pRBCs     - Hgb 9.2     - history of Fe deficiency  A fib     - continue diltiazem as SBP tolerates     - on IV heparin d/t chronic anemia; will continue through tonight, if Hgb stays stable, consider resuming eliquis in AM     - continue on tele     - resume eliquis  Chronic HFpEF     - currently euvolemic     - reassess in AM  COPD     - continue dulera  Hypokalemia     - K+ tablets 59mEQ BID; monitor  Fibromyaglia     - APAP, hydrocodone  Debility     - PT consult; HHPT   DVT prophylaxis: Heparin Code Status: DNR   Disposition Plan: TBD      Antimicrobials:   Rocephin 2g q24h    Subjective: "I'm doing ok."  Objective: Vitals:   10/21/18 2022 10/22/18 0500 10/22/18 0613 10/22/18 1123  BP: (!) 108/48  (!) 115/49 (!) 115/43  Pulse: 82  71 80  Resp: 20  20 18   Temp: 99.4 F (37.4 C)  99.4 F (37.4 C) 99 F (37.2 C)  TempSrc: Oral  Oral Oral  SpO2: 94%  94% 100%  Weight:  130.9  kg    Height:        Intake/Output Summary (Last 24 hours) at 10/22/2018 1420 Last data filed at 10/22/2018 1029 Gross per 24 hour  Intake 1584 ml  Output 975 ml  Net 609 ml   Filed Weights   10/20/18 1053 10/21/18 0549 10/22/18 0500  Weight: 129.3 kg 130 kg 130.9 kg    Examination:  General exam: 63 y.o. female Appears calm and comfortable  Respiratory system: Clear to auscultation. Respiratory effort normal. Cardiovascular system: S1 & S2 heard, RRR. No JVD, murmurs, rubs, gallops or clicks. No pedal edema. Gastrointestinal system: Abdomen is nondistended, soft and nontender. No organomegaly or masses felt. Normal bowel sounds heard. Central nervous system: Alert and oriented. No focal neurological deficits. Extremities: Symmetric 5 x 5 power. Skin: No rashes, lesions or ulcers    Data Reviewed: I  have personally reviewed following labs and imaging studies.  CBC: Recent Labs  Lab 10/20/18 1110 10/20/18 2114 10/21/18 0416 10/21/18 1242 10/22/18 0406  WBC 25.6* 14.3* 13.6* 10.1 7.0  HGB 6.7* 9.7* 9.9* 9.2* 9.4*  HCT 22.3* 31.5* 32.2* 30.4* 31.1*  MCV 88.1 86.8 88.0 88.1 88.4  PLT 238 PLATELET CLUMPS NOTED ON SMEAR, COUNT APPEARS ADEQUATE 81* PLATELET CLUMPS NOTED ON SMEAR, UNABLE TO ESTIMATE PLATELET CLUMPS NOTED ON SMEAR, UNABLE TO ESTIMATE   Basic Metabolic Panel: Recent Labs  Lab 10/20/18 1110 10/21/18 0416 10/21/18 1242 10/22/18 0406  NA 133* 135 135 135  K 2.6* 2.6* 3.0* 3.1*  CL 90* 98 99 102  CO2 27 28 27 23   GLUCOSE 133* 99 116* 88  BUN 21 15 11 9   CREATININE 1.12* 0.95 0.82 0.79  CALCIUM 8.7* 7.8* 7.6* 7.6*  MG  --  2.2  --  2.2  PHOS  --   --   --  3.5   GFR: Estimated Creatinine Clearance: 99.3 mL/min (by C-G formula based on SCr of 0.79 mg/dL). Liver Function Tests: Recent Labs  Lab 10/20/18 1110 10/22/18 0406  AST 25  --   ALT 32  --   ALKPHOS 122  --   BILITOT 1.5*  --   PROT 7.6  --   ALBUMIN 3.5 2.4*   Recent Labs  Lab 10/20/18 1110  LIPASE 28   No results for input(s): AMMONIA in the last 168 hours. Coagulation Profile: No results for input(s): INR, PROTIME in the last 168 hours. Cardiac Enzymes: Recent Labs  Lab 10/20/18 1110  TROPONINI <0.03   BNP (last 3 results) No results for input(s): PROBNP in the last 8760 hours. HbA1C: No results for input(s): HGBA1C in the last 72 hours. CBG: Recent Labs  Lab 10/20/18 1053  GLUCAP 130*   Lipid Profile: No results for input(s): CHOL, HDL, LDLCALC, TRIG, CHOLHDL, LDLDIRECT in the last 72 hours. Thyroid Function Tests: Recent Labs    10/20/18 2114  TSH 0.359   Anemia Panel: Recent Labs    10/20/18 2114  VITAMINB12 1,019*  FERRITIN 1,248*  TIBC 239*  IRON 11*   Sepsis Labs: No results for input(s): PROCALCITON, LATICACIDVEN in the last 168 hours.  Recent Results  (from the past 240 hour(s))  Blood culture (routine x 2)     Status: Abnormal (Preliminary result)   Collection Time: 10/20/18 11:10 AM  Result Value Ref Range Status   Specimen Description BLOOD LEFT HAND  Final   Special Requests   Final    BOTTLES DRAWN AEROBIC AND ANAEROBIC Blood Culture adequate volume   Culture  Setup  Time   Final    GRAM NEGATIVE RODS AEROBIC BOTTLE ONLY CRITICAL RESULT CALLED TO, READ BACK BY AND VERIFIED WITH: L. SEAY PHARMD, AT 8338 10/21/18 BY D. VANHOOK    Culture (A)  Final    ESCHERICHIA COLI SUSCEPTIBILITIES TO FOLLOW Performed at [facility], [place] [address], [place], [place] [address]    Report Status PENDING  Incomplete  Blood Culture ID Panel (Reflexed)     Status: Abnormal   Collection Time: 10/20/18 11:10 AM  Result Value Ref Range Status   Enterococcus species NOT DETECTED NOT DETECTED Final   Listeria monocytogenes NOT DETECTED NOT DETECTED Final   Staphylococcus species NOT DETECTED NOT DETECTED Final   Staphylococcus aureus (BCID) NOT DETECTED NOT DETECTED Final   Streptococcus species NOT DETECTED NOT DETECTED Final   Streptococcus agalactiae NOT DETECTED NOT DETECTED Final   Streptococcus pneumoniae NOT DETECTED NOT DETECTED Final   Streptococcus pyogenes NOT DETECTED NOT DETECTED Final   Acinetobacter baumannii NOT DETECTED NOT DETECTED Final   Enterobacteriaceae species DETECTED (A) NOT DETECTED Final    Comment: Enterobacteriaceae represent a large family of gram-negative bacteria, not a single organism. CRITICAL RESULT CALLED TO, READ BACK BY AND VERIFIED WITH: L. SEAY PHARMD, AT 0705 10/21/18 BY D. VANHOOK    Enterobacter cloacae complex NOT DETECTED NOT DETECTED Final   Escherichia coli DETECTED (A) NOT DETECTED Final    Comment: CRITICAL RESULT CALLED TO, READ BACK BY AND VERIFIED WITH: L. SEAY PHARMD, AT 9767 10/21/18 BY D. VANHOOK    Klebsiella oxytoca NOT DETECTED NOT DETECTED Final   Klebsiella pneumoniae NOT DETECTED  NOT DETECTED Final   Proteus species NOT DETECTED NOT DETECTED Final   Serratia marcescens NOT DETECTED NOT DETECTED Final   Carbapenem resistance NOT DETECTED NOT DETECTED Final   Haemophilus influenzae NOT DETECTED NOT DETECTED Final   Neisseria meningitidis NOT DETECTED NOT DETECTED Final   Pseudomonas aeruginosa NOT DETECTED NOT DETECTED Final   Candida albicans NOT DETECTED NOT DETECTED Final   Candida glabrata NOT DETECTED NOT DETECTED Final   Candida krusei NOT DETECTED NOT DETECTED Final   Candida parapsilosis NOT DETECTED NOT DETECTED Final   Candida tropicalis NOT DETECTED NOT DETECTED Final    Comment: Performed at [facility], [place] [address], [place], [place] [address]  Blood culture (routine x 2)     Status: None (Preliminary result)   Collection Time: 10/20/18 12:55 PM  Result Value Ref Range Status   Specimen Description BLOOD RIGHT ANTECUBITAL  Final   Special Requests   Final    BOTTLES DRAWN AEROBIC AND ANAEROBIC Blood Culture adequate volume   Culture   Final    NO GROWTH 2 DAYS Performed at [facility], [place] [address], [place], [place] [address]    Report Status PENDING  Incomplete  SARS Coronavirus 2 (CEPHEID - Performed in [facility]), Hosp Order     Status: None   Collection Time: 10/20/18  1:28 PM  Result Value Ref Range Status   SARS Coronavirus 2 NEGATIVE NEGATIVE Final    Comment: (NOTE) If result is NEGATIVE SARS-CoV-2 target nucleic acids are NOT DETECTED. The SARS-CoV-2 RNA is generally detectable in upper and lower  respiratory specimens during the acute phase of infection. The lowest  concentration of SARS-CoV-2 viral copies this assay can detect is 250  copies / mL. A negative result does not preclude SARS-CoV-2 infection  and should not be used as the sole basis  for treatment or other  patient management decisions.  A negative result may occur with  improper specimen collection / handling, submission of specimen other    than nasopharyngeal swab, presence of viral mutation(s) within the  areas targeted by this assay, and inadequate number of viral copies  (<250 copies / mL). A negative result must be combined with clinical  observations, patient history, and epidemiological information. If result is POSITIVE SARS-CoV-2 target nucleic acids are DETECTED. The SARS-CoV-2 RNA is generally detectable in upper and lower  respiratory specimens dur ing the acute phase of infection.  Positive  results are indicative of active infection with SARS-CoV-2.  Clinical  correlation with patient history and other diagnostic information is  necessary to determine patient infection status.  Positive results do  not rule out bacterial infection or co-infection with other viruses. If result is PRESUMPTIVE POSTIVE SARS-CoV-2 nucleic acids MAY BE PRESENT.   A presumptive positive result was obtained on the submitted specimen  and confirmed on repeat testing.  While 2019 novel coronavirus  (SARS-CoV-2) nucleic acids may be present in the submitted sample  additional confirmatory testing may be necessary for epidemiological  and / or clinical management purposes  to differentiate between  SARS-CoV-2 and other Sarbecovirus currently known to infect humans.  If clinically indicated additional testing with an alternate test  methodology [phone]) is advised. The SARS-CoV-2 RNA is generally  detectable in upper and lower respiratory sp ecimens during the acute  phase of infection. The expected result is Negative. Fact Sheet for Patients:  StrictlyIdeas.no Fact Sheet for Healthcare Providers: BankingDealers.co.za This test is not yet approved or cleared by the [place] FDA and has been authorized for detection and/or diagnosis of SARS-CoV-2 by FDA under an Emergency Use Authorization (EUA).  This EUA will remain in effect (meaning this test can be used) for the duration of  the COVID-19 declaration under Section 564(b)(1) of the Act, 21 U.S.C. section 360bbb-3(b)(1), unless the authorization is terminated or revoked sooner. Performed at [facility], [place] [address], [place], [place] [address]   MRSA PCR Screening     Status: Abnormal   Collection Time: 10/21/18  9:33 AM  Result Value Ref Range Status   MRSA by PCR POSITIVE (A) NEGATIVE Final    Comment:        The GeneXpert MRSA Assay (FDA approved for NASAL specimens only), is one component of a comprehensive MRSA colonization surveillance program. It is not intended to diagnose MRSA infection nor to guide or monitor treatment for MRSA infections. RESULT CALLED TO, READ BACK BY AND VERIFIED WITH: Tenny Craw RN 11:45 10/21/18 ([email or web address]) Performed at [facility], [place] [address], [place], [place] [address]          Radiology Studies: Ct Abdomen Pelvis W Contrast  Result Date: 10/20/2018 CLINICAL DATA:  Generalized weakness, vomiting, diarrhea EXAM: CT ABDOMEN AND PELVIS WITH CONTRAST TECHNIQUE: Multidetector CT imaging of the abdomen and pelvis was performed using the standard protocol following bolus administration of intravenous contrast. CONTRAST:  154mL OMNIPAQUE IOHEXOL 300 MG/ML  SOLN COMPARISON:  03/26/2018 FINDINGS: Lower chest: No acute abnormality. Hepatobiliary: No focal liver abnormality is seen. Hepatic steatosis. No gallstones, gallbladder wall thickening, or biliary dilatation. Pancreas: Unremarkable. No pancreatic ductal dilatation or surrounding inflammatory changes. Spleen: Normal in size without focal abnormality. Adrenals/Urinary Tract: Adrenal glands are unremarkable. Multiple bilateral nonobstructive renal calculi. There is a 2 mm calculus in the dependent urinary bladder (series 3, image 85). There is  mild right hydronephrosis and hydroureter with stranding about the right kidney. Stomach/Bowel: Stomach is within normal limits. Appendix appears normal. No evidence of  bowel wall thickening, distention, or inflammatory changes. There is extensive small bowel and colonic diverticulosis. Vascular/Lymphatic: Calcific atherosclerosis. No enlarged abdominal or pelvic lymph nodes. Reproductive: No mass or other abnormality. IUD present in the endometrial cavity. Other: No abdominal wall hernia or abnormality. No abdominopelvic ascites. Musculoskeletal: No acute or significant osseous findings. Disc degenerative disease and ankylosis of the spine IMPRESSION: 1. Multiple bilateral nonobstructive renal calculi. There is a 2 mm calculus in the dependent urinary bladder (series 3, image 85). There is mild right hydronephrosis and hydroureter with stranding about the right kidney. Findings are consistent with a recently passed right-sided ureteral calculus. 2.  Chronic and incidental findings as detailed above. Electronically Signed   By: Eddie Candle M.D.   On: 10/20/2018 14:49        Scheduled Meds:  apixaban  5 mg Oral BID   Chlorhexidine Gluconate Cloth  6 each Topical Q0600   diltiazem  240 mg Oral Daily   fluticasone  2 spray Each Nare Daily   levothyroxine  350 mcg Oral Q0600   loratadine  10 mg Oral QPM   mometasone-formoterol  2 puff Inhalation BID   montelukast  10 mg Oral QHS   mupirocin ointment  1 application Nasal BID   nystatin cream  1 application Topical BID   pantoprazole  40 mg Oral Daily   potassium chloride  40 mEq Oral BID   Continuous Infusions:  cefTRIAXone (ROCEPHIN)  IV 2 g (10/22/18 1054)     LOS: 2 days    Time spent: 25 minutes spent in the coordination of care today.    Jonnie Finner, DO [facility] Pager [phone]  If 7PM-7AM, please contact night-coverage [email or web address] Password [email or web address] 10/22/2018, 2:20 PM

## 2018-10-23 LAB — CULTURE, BLOOD (ROUTINE X 2): Special Requests: ADEQUATE

## 2018-10-23 MED ORDER — CEPHALEXIN 250 MG PO CAPS: 500.0000 mg | ORAL_CAPSULE | Freq: Three times a day (TID) | ORAL | Status: DC

## 2018-10-23 MED ORDER — SULFAMETHOXAZOLE-TRIMETHOPRIM 400-80 MG PO TABS
2.0000 | ORAL_TABLET | Freq: Two times a day (BID) | ORAL | Status: DC
  Administered 2018-10-24: 2 via ORAL
  Filled 2018-10-23 (×2): qty 2

## 2018-10-23 MED ORDER — HYDROCORTISONE 1 % EX CREA
TOPICAL_CREAM | Freq: Four times a day (QID) | CUTANEOUS | Status: DC | PRN
  Administered 2018-10-23 – 2018-10-24 (×3): via TOPICAL
  Filled 2018-10-23 (×2): qty 28

## 2018-10-23 MED ORDER — LEVOTHYROXINE SODIUM 100 MCG PO TABS
400.0000 ug | ORAL_TABLET | Freq: Every day | ORAL | Status: DC
  Administered 2018-10-24: 400 ug via ORAL
  Filled 2018-10-23: qty 4

## 2018-10-23 MED ORDER — CALCITRIOL 0.5 MCG PO CAPS
1.0000 ug | ORAL_CAPSULE | Freq: Every day | ORAL | Status: DC
  Administered 2018-10-23 – 2018-10-24 (×2): 1 ug via ORAL
  Filled 2018-10-23 (×2): qty 2

## 2018-10-23 NOTE — Progress Notes (Signed)
Pt concerned regarding synthroid dose 35 58mcg, pt states she takes 400 mcg at home, pt also states she takes Calcitrol daily  Informed pharmacy, Lattie Haw, she stated she will review pt chart and call back  Will continue to monitor

## 2018-10-23 NOTE — Progress Notes (Signed)
Physical Therapy Treatment Patient Details Name: [name] MRN: [id number] DOB: [birth year] Today's Date: 10/23/2018    History of Present Illness 63 y.o. female with medical history significant for iron deficiency anemia, pernicious anemia, gastric bypass and absorption, chronic atrial fibrillation on Eliquis who presents on 10/20/2018 with complaint of nausea, vomiting, weakness, and back pain.     PT Comments    Pt performed progression to gait training with good progress.  She was unable to progress steps last session and today she was able to ambulate to the bathroom and back.  Pt noticeably fatigue after activity.  Continue to recommend HHPT at this time.      Follow Up Recommendations  Home health PT     Equipment Recommendations  None recommended by PT    Recommendations for Other Services       Precautions / Restrictions Precautions Precautions: Fall Restrictions Weight Bearing Restrictions: No    Mobility  Bed Mobility               General bed mobility comments: Pt in recliner on arrival.    Transfers Overall transfer level: Needs assistance Equipment used: Rolling walker (2 wheeled) Transfers: Sit to/from Stand Sit to Stand: Min guard Stand pivot transfers: Min guard       General transfer comment: Cues for hand placement requiring decreased assistance.    Ambulation/Gait Ambulation/Gait assistance: Min guard Gait Distance (Feet): 15 Feet(x2) Assistive device: Rolling walker (2 wheeled) Gait Pattern/deviations: Step-through pattern;Trunk flexed;Wide base of support     General Gait Details: Cues for upper trunk control and RW safety.     Stairs             Wheelchair Mobility    Modified Rankin (Stroke Patients Only)       Balance Overall balance assessment: Needs assistance   Sitting balance-Leahy Scale: Good       Standing balance-Leahy Scale: Poor                              Cognition  Arousal/Alertness: Awake/alert Behavior During Therapy: WFL for tasks assessed/performed Overall Cognitive Status: Within Functional Limits for tasks assessed                                        Exercises      General Comments        Pertinent Vitals/Pain Pain Assessment: No/denies pain    Home Living                      Prior Function            PT Goals (current goals can now be found in the care plan section) Acute Rehab PT Goals Patient Stated Goal: home Potential to Achieve Goals: Fair Progress towards PT goals: Progressing toward goals    Frequency    Min 3X/week      PT Plan Current plan remains appropriate    Co-evaluation              AM-PAC PT "6 Clicks" Mobility   Outcome Measure  Help needed turning from your back to your side while in a flat bed without using bedrails?: A Little Help needed moving from lying on your back to sitting on the side of a flat bed without using bedrails?: A Lot Help  needed moving to and from a bed to a chair (including a wheelchair)?: A Little Help needed standing up from a chair using your arms (e.g., wheelchair or bedside chair)?: A Little Help needed to walk in hospital room?: A Lot Help needed climbing 3-5 steps with a railing? : A Lot 6 Click Score: 15    End of Session Equipment Utilized During Treatment: Gait belt Activity Tolerance: Patient tolerated treatment well Patient left: in chair;with call bell/phone within reach Nurse Communication: Mobility status PT Visit Diagnosis: Other abnormalities of gait and mobility (R26.89);Muscle weakness (generalized) (M62.81);Difficulty in walking, not elsewhere classified (R26.2)     Time: 1173-5670 PT Time Calculation (min) (ACUTE ONLY): 14 min  Charges:  $Gait Training: 8-22 mins                     Governor Rooks, PTA Acute Rehabilitation Services Pager [phone] Office [place] 10/23/2018, 4:32  PM

## 2018-10-23 NOTE — Progress Notes (Signed)
[name]  PROGRESS NOTE    [name]  OEU:[id number] DOB: 1955-08-05 DOA: 10/20/2018 PCP: Debbrah Alar, NP   Brief Narrative:   Jamie Situ Wilsonis a 63 y.o.femalewith medical history significant foriron deficiency anemia, pernicious anemia, gastric bypass and absorption, chronic atrial fibrillation on Eliquiswho presents on 5/27/2020with complaint of nausea, vomiting, back pain. She first noticed back pain several days ago that she states is consistent with her prior episode of kidney stones. She did not notice any blood in her urine or pain with urination at that time. If they worsen with 4 episodes of nonbilious, nonbloody vomiting a day prior to presenting to the ED. Without any abdominal pain, no fevers, no chills, no recent sick contacts, no new medications, no recent travel. This was followed by diarrhea that started this morning without any blood or black tarry stool. She felt much more weak than usual while getting out of the shower and slid out of the chair onto the floor without any head trauma or loss of consciousness.  Of note patient has history of recurrent kidney stones, last evaluated by [facility] on 01/2018 for ureteroscopy for evaluation of stent(whichwas found tohavepassed at that time).Of note in past kidney stones deemed releated to hypocitraturia and hyperoxaluria. She denies any new symptoms until recently   Assessment & Plan:   Active Problems:   Hypothyroidism   OBESITY, MORBID   Iron deficiency anemia secondary to malabsorption    Leukocytosis   Essential hypertension   Asthma   GERD   Atrial fibrillation (HCC)   Hypokalemia   Sepsis ([place])   E. coli bacteremia/septic shock - septic at presentation: fever 101.2, hypotensive, WBC 25.6 - started on vanc/cefepime - Bld Cx w/ E coli (BCID positive);de escalated to rocephin - temperature curve better; continue - question if nephrolithiasis was source - change to  keflex 500mg  q8h end date 11/03/2018  Symptomatic anemia - no evidence of bleed - normocytic - s/p 1unit pRBCs - Hgb 9.2 - history of Fe deficiency  A fib - continue diltiazem as SBP tolerates - on IV heparin d/t chronic anemia; will continue through tonight, if Hgb stays stable, consider resuming eliquis in AM - continue on tele     - resumed eliquis  Chronic HFpEF - currently euvolemic - reassess in AM  COPD - PRN duonebs  Hypokalemia - K+ tablets 5mEQ BID; monitor  Fibromyaglia - APAP, hydrocodone  Debility - PT consult; HHPT   DVT prophylaxis:Heparin Code Status:DNR Disposition Plan:TBD   Antimicrobials:  . Keflex 500mg  PO q8h (End Date 11/03/18)   Subjective: "I'm ok."  Objective: Vitals:   10/23/18 0500 10/23/18 0649 10/23/18 0855 10/23/18 1212  BP:  (!) 96/31 (!) 111/48 99/62  Pulse:  73 72 67  Resp:  16  16  Temp:  98.2 F (36.8 C)  (!) 97.5 F (36.4 C)  TempSrc:  Oral  Oral  SpO2:  94%    Weight: 131.1 kg     Height:        Intake/Output Summary (Last 24 hours) at 10/23/2018 1306 Last data filed at 10/23/2018 1044 Gross per 24 hour  Intake 1000 ml  Output 1500 ml  Net -500 ml   Filed Weights   10/21/18 0549 10/22/18 0500 10/23/18 0500  Weight: 130 kg 130.9 kg 131.1 kg    Examination:  General exam: 63 y.o. female Appears calm and comfortable  Respiratory system: Clear to auscultation. Respiratory effort normal. Cardiovascular system: S1 & S2 heard, RRR. No JVD,  murmurs, rubs, gallops or clicks. No pedal edema. Gastrointestinal system: Abdomen is nondistended, soft and nontender. No organomegaly or masses felt. Normal bowel sounds heard. Central nervous system: Alert and oriented. No focal neurological deficits. Extremities: Symmetric 5 x 5 power. Skin: No rashes, lesions or ulcers Psychiatry: Judgement and insight appear normal. Mood & affect appropriate.      Data Reviewed: I have personally reviewed following labs and imaging studies.  CBC: Recent Labs  Lab 10/20/18 1110 10/20/18 2114 10/21/18 0416 10/21/18 1242 10/22/18 0406  WBC 25.6* 14.3* 13.6* 10.1 7.0  HGB 6.7* 9.7* 9.9* 9.2* 9.4*  HCT 22.3* 31.5* 32.2* 30.4* 31.1*  MCV 88.1 86.8 88.0 88.1 88.4  PLT 238 PLATELET CLUMPS NOTED ON SMEAR, COUNT APPEARS ADEQUATE 81* PLATELET CLUMPS NOTED ON SMEAR, UNABLE TO ESTIMATE PLATELET CLUMPS NOTED ON SMEAR, UNABLE TO ESTIMATE   Basic Metabolic Panel: Recent Labs  Lab 10/20/18 1110 10/21/18 0416 10/21/18 1242 10/22/18 0406  NA 133* 135 135 135  K 2.6* 2.6* 3.0* 3.1*  CL 90* 98 99 102  CO2 27 28 27 23   GLUCOSE 133* 99 116* 88  BUN 21 15 11 9   CREATININE 1.12* 0.95 0.82 0.79  CALCIUM 8.7* 7.8* 7.6* 7.6*  MG  --  2.2  --  2.2  PHOS  --   --   --  3.5   GFR: Estimated Creatinine Clearance: 99.4 mL/min (by C-G formula based on SCr of 0.79 mg/dL). Liver Function Tests: Recent Labs  Lab 10/20/18 1110 10/22/18 0406  AST 25  --   ALT 32  --   ALKPHOS 122  --   BILITOT 1.5*  --   PROT 7.6  --   ALBUMIN 3.5 2.4*   Recent Labs  Lab 10/20/18 1110  LIPASE 28   No results for input(s): AMMONIA in the last 168 hours. Coagulation Profile: No results for input(s): INR, PROTIME in the last 168 hours. Cardiac Enzymes: Recent Labs  Lab 10/20/18 1110  TROPONINI <0.03   BNP (last 3 results) No results for input(s): PROBNP in the last 8760 hours. HbA1C: No results for input(s): HGBA1C in the last 72 hours. CBG: Recent Labs  Lab 10/20/18 1053  GLUCAP 130*   Lipid Profile: No results for input(s): CHOL, HDL, LDLCALC, TRIG, CHOLHDL, LDLDIRECT in the last 72 hours. Thyroid Function Tests: Recent Labs    10/20/18 2114  TSH 0.359   Anemia Panel: Recent Labs    10/20/18 2114  VITAMINB12 1,019*  FERRITIN 1,248*  TIBC 239*  IRON 11*   Sepsis Labs: No results for input(s): PROCALCITON, LATICACIDVEN in the last 168 hours.   Recent Results (from the past 240 hour(s))  Blood culture (routine x 2)     Status: Abnormal   Collection Time: 10/20/18 11:10 AM  Result Value Ref Range Status   Specimen Description BLOOD LEFT HAND  Final   Special Requests   Final    BOTTLES DRAWN AEROBIC AND ANAEROBIC Blood Culture adequate volume   Culture  Setup Time   Final    GRAM NEGATIVE RODS AEROBIC BOTTLE ONLY CRITICAL RESULT CALLED TO, READ BACK BY AND VERIFIED WITH: Shellee Milo PHARMD, AT 0998 10/21/18 BY Rush Landmark Performed at [facility], [place] [address], [place],  [address]    Culture ESCHERICHIA COLI (A)  Final   Report Status 10/23/2018 FINAL  Final   Organism ID, Bacteria ESCHERICHIA COLI  Final      Susceptibility   Escherichia coli - MIC*  AMPICILLIN <=2 SENSITIVE Sensitive     CEFAZOLIN <=4 SENSITIVE Sensitive     CEFEPIME <=1 SENSITIVE Sensitive     CEFTAZIDIME <=1 SENSITIVE Sensitive     CEFTRIAXONE <=1 SENSITIVE Sensitive     CIPROFLOXACIN <=0.25 SENSITIVE Sensitive     GENTAMICIN <=1 SENSITIVE Sensitive     IMIPENEM <=0.25 SENSITIVE Sensitive     TRIMETH/SULFA <=20 SENSITIVE Sensitive     AMPICILLIN/SULBACTAM <=2 SENSITIVE Sensitive     PIP/TAZO <=4 SENSITIVE Sensitive     Extended ESBL NEGATIVE Sensitive     * ESCHERICHIA COLI  Blood Culture ID Panel (Reflexed)     Status: Abnormal   Collection Time: 10/20/18 11:10 AM  Result Value Ref Range Status   Enterococcus species NOT DETECTED NOT DETECTED Final   Listeria monocytogenes NOT DETECTED NOT DETECTED Final   Staphylococcus species NOT DETECTED NOT DETECTED Final   Staphylococcus aureus (BCID) NOT DETECTED NOT DETECTED Final   Streptococcus species NOT DETECTED NOT DETECTED Final   Streptococcus agalactiae NOT DETECTED NOT DETECTED Final   Streptococcus pneumoniae NOT DETECTED NOT DETECTED Final   Streptococcus pyogenes NOT DETECTED NOT DETECTED Final   Acinetobacter baumannii NOT DETECTED NOT DETECTED Final   Enterobacteriaceae  species DETECTED (A) NOT DETECTED Final    Comment: Enterobacteriaceae represent a large family of gram-negative bacteria, not a single organism. CRITICAL RESULT CALLED TO, READ BACK BY AND VERIFIED WITH: L. SEAY PHARMD, AT 0705 10/21/18 BY D. VANHOOK    Enterobacter cloacae complex NOT DETECTED NOT DETECTED Final   Escherichia coli DETECTED (A) NOT DETECTED Final    Comment: CRITICAL RESULT CALLED TO, READ BACK BY AND VERIFIED WITH: L. SEAY PHARMD, AT 3500 10/21/18 BY D. VANHOOK    Klebsiella oxytoca NOT DETECTED NOT DETECTED Final   Klebsiella pneumoniae NOT DETECTED NOT DETECTED Final   Proteus species NOT DETECTED NOT DETECTED Final   Serratia marcescens NOT DETECTED NOT DETECTED Final   Carbapenem resistance NOT DETECTED NOT DETECTED Final   Haemophilus influenzae NOT DETECTED NOT DETECTED Final   Neisseria meningitidis NOT DETECTED NOT DETECTED Final   Pseudomonas aeruginosa NOT DETECTED NOT DETECTED Final   Candida albicans NOT DETECTED NOT DETECTED Final   Candida glabrata NOT DETECTED NOT DETECTED Final   Candida krusei NOT DETECTED NOT DETECTED Final   Candida parapsilosis NOT DETECTED NOT DETECTED Final   Candida tropicalis NOT DETECTED NOT DETECTED Final    Comment: Performed at [facility], [place] [address] [place], [place] [address]  Blood culture (routine x 2)     Status: None (Preliminary result)   Collection Time: 10/20/18 12:55 PM  Result Value Ref Range Status   Specimen Description BLOOD RIGHT ANTECUBITAL  Final   Special Requests   Final    BOTTLES DRAWN AEROBIC AND ANAEROBIC Blood Culture adequate volume   Culture   Final    NO GROWTH 2 DAYS Performed at [facility], [place] [address] [place], [place] [address]    Report Status PENDING  Incomplete  SARS Coronavirus 2 (CEPHEID - Performed in [facility] hospital lab), Hosp Order     Status: None   Collection Time: 10/20/18  1:28 PM  Result Value Ref Range Status   SARS Coronavirus 2 NEGATIVE  NEGATIVE Final    Comment: (NOTE) If result is NEGATIVE SARS-CoV-2 target nucleic acids are NOT DETECTED. The SARS-CoV-2 RNA is generally detectable in upper and lower  respiratory specimens during the acute phase of infection. The lowest  concentration  of SARS-CoV-2 viral copies this assay can detect is 250  copies / mL. A negative result does not preclude SARS-CoV-2 infection  and should not be used as the sole basis for treatment or other  patient management decisions.  A negative result may occur with  improper specimen collection / handling, submission of specimen other  than nasopharyngeal swab, presence of viral mutation(s) within the  areas targeted by this assay, and inadequate number of viral copies  (<250 copies / mL). A negative result must be combined with clinical  observations, patient history, and epidemiological information. If result is POSITIVE SARS-CoV-2 target nucleic acids are DETECTED. The SARS-CoV-2 RNA is generally detectable in upper and lower  respiratory specimens dur ing the acute phase of infection.  Positive  results are indicative of active infection with SARS-CoV-2.  Clinical  correlation with patient history and other diagnostic information is  necessary to determine patient infection status.  Positive results do  not rule out bacterial infection or co-infection with other viruses. If result is PRESUMPTIVE POSTIVE SARS-CoV-2 nucleic acids MAY BE PRESENT.   A presumptive positive result was obtained on the submitted specimen  and confirmed on repeat testing.  While 2019 novel coronavirus  (SARS-CoV-2) nucleic acids may be present in the submitted sample  additional confirmatory testing may be necessary for epidemiological  and / or clinical management purposes  to differentiate between  SARS-CoV-2 and other Sarbecovirus currently known to infect humans.  If clinically indicated additional testing with an alternate test  methodology [phone]) is  advised. The SARS-CoV-2 RNA is generally  detectable in upper and lower respiratory sp ecimens during the acute  phase of infection. The expected result is Negative. Fact Sheet for Patients:  StrictlyIdeas.no Fact Sheet for Healthcare Providers: BankingDealers.co.za This test is not yet approved or cleared by the [place] FDA and has been authorized for detection and/or diagnosis of SARS-CoV-2 by FDA under an Emergency Use Authorization (EUA).  This EUA will remain in effect (meaning this test can be used) for the duration of the COVID-19 declaration under Section 564(b)(1) of the Act, 21 U.S.C. section 360bbb-3(b)(1), unless the authorization is terminated or revoked sooner. Performed at [facility], [place] [address] [place], [place] [address]   MRSA PCR Screening     Status: Abnormal   Collection Time: 10/21/18  9:33 AM  Result Value Ref Range Status   MRSA by PCR POSITIVE (A) NEGATIVE Final    Comment:        The GeneXpert MRSA Assay (FDA approved for NASAL specimens only), is one component of a comprehensive MRSA colonization surveillance program. It is not intended to diagnose MRSA infection nor to guide or monitor treatment for MRSA infections. RESULT CALLED TO, READ BACK BY AND VERIFIED WITH: Tenny Craw RN 11:45 10/21/18 ([email or web address]) Performed at [facility], [place] [address] [place], [place] [address]          Radiology Studies: No results found.      Scheduled Meds: . apixaban  5 mg Oral BID  . calcitRIOL  1 mcg Oral Daily  . cephALEXin  500 mg Oral Q8H  . Chlorhexidine Gluconate Cloth  6 each Topical Q0600  . diltiazem  240 mg Oral Daily  . fluticasone  2 spray Each Nare Daily  . [START ON 10/24/2018] levothyroxine  400 mcg Oral Q0600  . loratadine  10 mg Oral QPM  . montelukast  10 mg Oral QHS  . mupirocin ointment  1 application  Nasal BID  . nystatin cream  1 application Topical BID  .  pantoprazole  40 mg Oral Daily  . potassium chloride  40 mEq Oral BID   Continuous Infusions: . cefTRIAXone (ROCEPHIN)  IV 2 g (10/23/18 1119)     LOS: 3 days    Time spent: 25 minutes spent in the coordination of care today.    Jonnie Finner, DO [facility] Pager [phone]  If 7PM-7AM, please contact night-coverage [email or web address] Password [email or web address] 10/23/2018, 1:06 PM

## 2018-10-23 NOTE — Progress Notes (Signed)
Patient states she has a new rash on her arms  Informed MD  Will continue to monitor

## 2018-10-24 MED ORDER — HYDROCORTISONE 1 % EX CREA: TOPICAL_CREAM | Freq: Four times a day (QID) | CUTANEOUS | 0 refills | Status: DC | PRN

## 2018-10-24 MED ORDER — SULFAMETHOXAZOLE-TRIMETHOPRIM 400-80 MG PO TABS: 2.0000 | ORAL_TABLET | Freq: Two times a day (BID) | ORAL | 0 refills | Status: AC

## 2018-10-24 MED ORDER — MUPIROCIN 2 % EX OINT: 1.0000 "application " | TOPICAL_OINTMENT | Freq: Two times a day (BID) | CUTANEOUS | 0 refills | Status: DC

## 2018-10-24 NOTE — Discharge Summary (Signed)
. Physician Discharge Summary  [name] MCN:[id number] DOB: [birth year] DOA: 10/20/2018  PCP: Debbrah Alar, NP  Admit date: 10/20/2018 Discharge date: 10/24/2018  Admitted From: Home Disposition:  Discharged to home with home health.  Recommendations for Outpatient Follow-up:  1. Follow up with PCP in 1-2 weeks 2. Please obtain BMP/CBC in one week   Discharge Condition: Stable  CODE STATUS: DNR   Brief/Interim Summary: Jamie G Wilsonis a 63 y.o.femalewith medical history significant foriron deficiency anemia, pernicious anemia, gastric bypass and absorption, chronic atrial fibrillation on Eliquiswho presents on 5/27/2020with complaint of nausea, vomiting, back pain. She first noticed back pain several days ago that she states is consistent with her prior episode of kidney stones. She did not notice any blood in her urine or pain with urination at that time. If they worsen with 4 episodes of nonbilious, nonbloody vomiting a day prior to presenting to the ED. Without any abdominal pain, no fevers, no chills, no recent sick contacts, no new medications, no recent travel. This was followed by diarrhea that started this morning without any blood or black tarry stool. She felt much more weak than usual while getting out of the shower and slid out of the chair onto the floor without any head trauma or loss of consciousness.  Of note patient has history of recurrent kidney stones, last evaluated by [facility] on 01/2018 for ureteroscopy for evaluation of stent(whichwas found tohavepassed at that time).Of note in past kidney stones deemed releated to hypocitraturia and hyperoxaluria. She denies any new symptoms until recently  Discharge Diagnoses:  Active Problems:   Hypothyroidism   OBESITY, MORBID   Iron deficiency anemia secondary to malabsorption    Leukocytosis   Essential hypertension   Asthma   GERD   Atrial fibrillation (HCC)   Hypokalemia   Sepsis  ([place])  E. coli bacteremia/septic shock - septic at presentation: fever 101.2, hypotensive, WBC 25.6 - started on vanc/cefepime - Bld Cx w/ E coli (BCID positive);de escalated to rocephin - temperature curve better; continue - question if nephrolithiasis was source - changed to keflex 500mg  q8h but suffered drug rash; abx will be bactrim 2 tablets BID w/ end date 11/03/18  Symptomatic anemia - no evidence of bleed - normocytic - s/p 1unit pRBCs - Hgb 9.2 - history of Fe deficiency  A fib - continue diltiazem as SBP tolerates - on IV heparin d/t chronic anemia; will continue through tonight, if Hgb stays stable, consider resuming eliquis in AM - continue on tele - resumed eliquis  Chronic HFpEF - currently euvolemic - reassess in AM  COPD - PRN duonebs  Hypokalemia - K+ tablets 1mEQ BID; monitor  Fibromyaglia - APAP, hydrocodone  Debility - PT consult; HHPT  Discharge Instructions 1. Follow up with PCP in 5 - 7 days.   Allergies as of 10/24/2018      Reactions   Other Anaphylaxis   NUTS   Rocephin [ceftriaxone]    rash   Amoxicillin-pot Clavulanate Other (See Comments)   headache   Ciprofloxacin Nausea Only, Rash   Food Hives, Other (See Comments)   Potato   Keflex [cephalexin] Rash   Penicillins Rash   Headache. And hives - Dose not tolerate Cephalosporin either   Has patient had a PCN reaction causing immediate rash, facial/tongue/throat swelling, SOB or lightheadedness with hypotension: No Has patient had a PCN reaction causing severe rash involving mucus membranes or skin necrosis: No Has patient had a PCN reaction that required hospitalization: No Has  patient had a PCN reaction occurring within the last 10 years: Yes If all of the above answers are "NO", then may proceed with Cephalosporin [place]   Tomato Hives      Medication List    STOP taking these  medications   loratadine 10 MG tablet Commonly known as:  CLARITIN   saccharomyces boulardii 250 MG capsule Commonly known as:  FLORASTOR   valACYclovir 1000 MG tablet Commonly known as:  VALTREX     TAKE these medications   acetaminophen 325 MG tablet Commonly known as:  TYLENOL Take 2 tablets (650 mg total) by mouth every 6 (six) hours as needed for mild pain or moderate pain.   albuterol 108 (90 Base) MCG/ACT inhaler Commonly known as:  VENTOLIN HFA Inhale 2 puffs into the lungs every 6 (six) hours as needed for wheezing or shortness of breath.   albuterol (2.5 MG/3ML) 0.083% nebulizer solution Commonly known as:  PROVENTIL Take 3 mLs (2.5 mg total) by nebulization every 6 (six) hours as needed for wheezing or shortness of breath.   calcitRIOL 0.5 MCG capsule Commonly known as:  ROCALTROL Take 2 mcg by mouth daily.   cyanocobalamin 1000 MCG/ML injection Commonly known as:  (VITAMIN B-12) Inject 1 mL (1,000 mcg total) into the skin every 30 (thirty) days.   desoximetasone 0.05 % cream Commonly known as:  TOPICORT Apply topically 2 (two) times daily as needed. What changed:    how much to take  reasons to take this   diclofenac sodium 1 % Gel Commonly known as:  VOLTAREN Apply 2 g topically 4 (four) times daily.   diltiazem 240 MG 24 hr capsule Commonly known as:  CARDIZEM CD TAKE 1 CAPSULE BY MOUTH  DAILY   diphenhydrAMINE 25 mg capsule Commonly known as:  BENADRYL Take 25 mg by mouth every 6 (six) hours as needed for itching.   Eliquis 5 MG Tabs tablet Generic drug:  apixaban TAKE 1 TABLET BY MOUTH TWO  TIMES DAILY What changed:  how much to take   EpiPen 2-Pak 0.3 mg/0.3 mL Soaj injection Generic drug:  EPINEPHrine Inject 0.3 mg into the muscle once.   furosemide 40 MG tablet Commonly known as:  LASIX TAKE 2 TABLETS BY MOUTH  DAILY IN THE MORNING AND 1  TABLET IN THE EVENING What changed:    how much to take  how to take this  when to take  this   hydrochlorothiazide 25 MG tablet Commonly known as:  HYDRODIURIL TAKE 1 TABLET(25 MG) BY MOUTH DAILY IN THE MORNING What changed:  See the new instructions.   hydrocortisone cream 1 % Apply topically 4 (four) times daily as needed for itching.   levocetirizine 5 MG tablet Commonly known as:  XYZAL TAKE 1 TABLET BY MOUTH  EVERY EVENING   levothyroxine 100 MCG tablet Commonly known as:  SYNTHROID Take 400 mcg by mouth daily before breakfast.   Melatonin 5 MG Tabs Take 5 mg by mouth at bedtime as needed (for sleep).   montelukast 10 MG tablet Commonly known as:  SINGULAIR Take 1 tablet (10 mg total) by mouth at bedtime.   mupirocin ointment 2 % Commonly known as:  BACTROBAN Place 1 application into the nose 2 (two) times daily.   nystatin 100000 UNIT/ML suspension Commonly known as:  MYCOSTATIN TAKE 5 ML BY MOUTH FOUR TIMES DAILY What changed:  See the new instructions.   nystatin cream Commonly known as:  MYCOSTATIN Apply 1 application topically 2 (two) times  daily. What changed:  Another medication with the same name was removed. Continue taking this medication, and follow the directions you see here.   omeprazole 40 MG capsule Commonly known as:  PRILOSEC TAKE 1 CAPSULE BY MOUTH  DAILY   ondansetron 4 MG tablet Commonly known as:  Zofran Take 1 tablet (4 mg total) by mouth every 8 (eight) hours as needed for nausea or vomiting.   potassium chloride 20 MEQ/15ML (10%) Soln Take 15 mLs (20 mEq total) by mouth 2 (two) times daily.   sulfamethoxazole-trimethoprim 400-80 MG tablet Commonly known as:  BACTRIM Take 2 tablets by mouth every 12 (twelve) hours for 22 doses.   Symbicort 160-4.5 MCG/ACT inhaler Generic drug:  budesonide-formoterol INHALE 2 PUFFS BY MOUTH TWO TIMES DAILY What changed:  See the new instructions.   SYRINGE 3CC/20GX1" 20G X 1" 3 ML Misc Use monthly as directed   traMADol 50 MG tablet Commonly known as:  ULTRAM TAKE 1 TABLET(50  MG) BY MOUTH EVERY 12 HOURS AS NEEDED What changed:    how much to take  how to take this  when to take this  reasons to take this  additional instructions   Tums Ultra 1000 400 MG chewable tablet Generic drug:  calcium elemental as carbonate Chew 2,000 mg by mouth 2 (two) times daily.   Vitamin D (Ergocalciferol) 1.25 MG (50000 UT) Caps capsule Commonly known as:  DRISDOL Take 1 capsule (50,000 Units total) by mouth every Monday, Wednesday, and Friday.   zafirlukast 20 MG tablet Commonly known as:  ACCOLATE TAKE 1 TABLET BY MOUTH TWO  TIMES DAILY BEFORE MEALS What changed:  See the new instructions.      Follow-up Information    [facility] Follow up.   Specialty:  Home Health Services Why:  They will do your home health care at your home Contact information: [address] [place] [address] [phone]          Allergies  Allergen Reactions  . Other Anaphylaxis    NUTS  . Amoxicillin-Pot Clavulanate Other (See Comments)    headache  . Ciprofloxacin Nausea Only and Rash  . Food Hives and Other (See Comments)    Potato  . Penicillins Rash    Headache. And hives - Dose not tolerate Cephalosporin either   Has patient had a PCN reaction causing immediate rash, facial/tongue/throat swelling, SOB or lightheadedness with hypotension: No Has patient had a PCN reaction causing severe rash involving mucus membranes or skin necrosis: No Has patient had a PCN reaction that required hospitalization: No Has patient had a PCN reaction occurring within the last 10 years: Yes If all of the above answers are "NO", then may proceed with Cephalosporin [place]  . Tomato Hives    Consultations:  None   Procedures/Studies: Dg Chest 1 View  Result Date: 10/20/2018 CLINICAL DATA:  Weakness EXAM: CHEST  1 VIEW COMPARISON:  06/30/2018 FINDINGS: Heart and mediastinal contours are within normal limits. No focal opacities or effusions. No acute bony  abnormality. IMPRESSION: No active disease. Electronically Signed   By: Rolm Baptise M.D.   On: 10/20/2018 11:38   Ct Abdomen Pelvis W Contrast  Result Date: 10/20/2018 CLINICAL DATA:  Generalized weakness, vomiting, diarrhea EXAM: CT ABDOMEN AND PELVIS WITH CONTRAST TECHNIQUE: Multidetector CT imaging of the abdomen and pelvis was performed using the standard protocol following bolus administration of intravenous contrast. CONTRAST:  184mL OMNIPAQUE IOHEXOL 300 MG/ML  SOLN COMPARISON:  03/26/2018 FINDINGS: Lower  chest: No acute abnormality. Hepatobiliary: No focal liver abnormality is seen. Hepatic steatosis. No gallstones, gallbladder wall thickening, or biliary dilatation. Pancreas: Unremarkable. No pancreatic ductal dilatation or surrounding inflammatory changes. Spleen: Normal in size without focal abnormality. Adrenals/Urinary Tract: Adrenal glands are unremarkable. Multiple bilateral nonobstructive renal calculi. There is a 2 mm calculus in the dependent urinary bladder (series 3, image 85). There is mild right hydronephrosis and hydroureter with stranding about the right kidney. Stomach/Bowel: Stomach is within normal limits. Appendix appears normal. No evidence of bowel wall thickening, distention, or inflammatory changes. There is extensive small bowel and colonic diverticulosis. Vascular/Lymphatic: Calcific atherosclerosis. No enlarged abdominal or pelvic lymph nodes. Reproductive: No mass or other abnormality. IUD present in the endometrial cavity. Other: No abdominal wall hernia or abnormality. No abdominopelvic ascites. Musculoskeletal: No acute or significant osseous findings. Disc degenerative disease and ankylosis of the spine IMPRESSION: 1. Multiple bilateral nonobstructive renal calculi. There is a 2 mm calculus in the dependent urinary bladder (series 3, image 85). There is mild right hydronephrosis and hydroureter with stranding about the right kidney. Findings are consistent with a recently  passed right-sided ureteral calculus. 2.  Chronic and incidental findings as detailed above. Electronically Signed   By: Eddie Candle M.D.   On: 10/20/2018 14:49      Subjective: "Will this go away?"  Discharge Exam: Vitals:   10/23/18 2041 10/24/18 0612  BP: (!) 108/49 (!) 118/55  Pulse: 71 77  Resp: 20 18  Temp: 98.5 F (36.9 C) 98.6 F (37 C)  SpO2: 96% 97%   Vitals:   10/23/18 1212 10/23/18 1459 10/23/18 2041 10/24/18 0612  BP: 99/62 (!) 107/48 (!) 108/49 (!) 118/55  Pulse: 67 68 71 77  Resp: 16  20 18   Temp: (!) 97.5 F (36.4 C)  98.5 F (36.9 C) 98.6 F (37 C)  TempSrc: Oral  Oral Oral  SpO2:   96% 97%  Weight:    131.7 kg  Height:        General exam: 63 y.o. female Appears calm and comfortable  Respiratory system: Clear to auscultation. Respiratory effort normal. Cardiovascular system: S1 & S2 heard, RRR. No JVD, murmurs, rubs, gallops or clicks. No pedal edema. Gastrointestinal system: Abdomen is nondistended, soft and nontender. No organomegaly or masses felt. Normal bowel sounds heard. Central nervous system: Alert and oriented. No focal neurological deficits. Extremities: Symmetric 5 x 5 power. Skin: drug rash noted on upper arms     The results of significant diagnostics from this hospitalization (including imaging, microbiology, ancillary and laboratory) are listed below for reference.     Microbiology: Recent Results (from the past 240 hour(s))  Blood culture (routine x 2)     Status: Abnormal   Collection Time: 10/20/18 11:10 AM  Result Value Ref Range Status   Specimen Description BLOOD LEFT HAND  Final   Special Requests   Final    BOTTLES DRAWN AEROBIC AND ANAEROBIC Blood Culture adequate volume   Culture  Setup Time   Final    GRAM NEGATIVE RODS AEROBIC BOTTLE ONLY CRITICAL RESULT CALLED TO, READ BACK BY AND VERIFIED WITH: Shellee Milo PHARMD, AT 7408 10/21/18 BY Rush Landmark Performed at [facility], [place] [address] [place], [place]  [address]    Culture ESCHERICHIA COLI (A)  Final   Report Status 10/23/2018 FINAL  Final   Organism ID, Bacteria ESCHERICHIA COLI  Final      Susceptibility   Escherichia coli - MIC*  AMPICILLIN <=2 SENSITIVE Sensitive     CEFAZOLIN <=4 SENSITIVE Sensitive     CEFEPIME <=1 SENSITIVE Sensitive     CEFTAZIDIME <=1 SENSITIVE Sensitive     CEFTRIAXONE <=1 SENSITIVE Sensitive     CIPROFLOXACIN <=0.25 SENSITIVE Sensitive     GENTAMICIN <=1 SENSITIVE Sensitive     IMIPENEM <=0.25 SENSITIVE Sensitive     TRIMETH/SULFA <=20 SENSITIVE Sensitive     AMPICILLIN/SULBACTAM <=2 SENSITIVE Sensitive     PIP/TAZO <=4 SENSITIVE Sensitive     Extended ESBL NEGATIVE Sensitive     * ESCHERICHIA COLI  Blood Culture ID Panel (Reflexed)     Status: Abnormal   Collection Time: 10/20/18 11:10 AM  Result Value Ref Range Status   Enterococcus species NOT DETECTED NOT DETECTED Final   Listeria monocytogenes NOT DETECTED NOT DETECTED Final   Staphylococcus species NOT DETECTED NOT DETECTED Final   Staphylococcus aureus (BCID) NOT DETECTED NOT DETECTED Final   Streptococcus species NOT DETECTED NOT DETECTED Final   Streptococcus agalactiae NOT DETECTED NOT DETECTED Final   Streptococcus pneumoniae NOT DETECTED NOT DETECTED Final   Streptococcus pyogenes NOT DETECTED NOT DETECTED Final   Acinetobacter baumannii NOT DETECTED NOT DETECTED Final   Enterobacteriaceae species DETECTED (A) NOT DETECTED Final    Comment: Enterobacteriaceae represent a large family of gram-negative bacteria, not a single organism. CRITICAL RESULT CALLED TO, READ BACK BY AND VERIFIED WITH: L. SEAY PHARMD, AT 0705 10/21/18 BY D. VANHOOK    Enterobacter cloacae complex NOT DETECTED NOT DETECTED Final   Escherichia coli DETECTED (A) NOT DETECTED Final    Comment: CRITICAL RESULT CALLED TO, READ BACK BY AND VERIFIED WITH: L. SEAY PHARMD, AT 3154 10/21/18 BY D. VANHOOK    Klebsiella oxytoca NOT DETECTED NOT DETECTED Final   Klebsiella  pneumoniae NOT DETECTED NOT DETECTED Final   Proteus species NOT DETECTED NOT DETECTED Final   Serratia marcescens NOT DETECTED NOT DETECTED Final   Carbapenem resistance NOT DETECTED NOT DETECTED Final   Haemophilus influenzae NOT DETECTED NOT DETECTED Final   Neisseria meningitidis NOT DETECTED NOT DETECTED Final   Pseudomonas aeruginosa NOT DETECTED NOT DETECTED Final   Candida albicans NOT DETECTED NOT DETECTED Final   Candida glabrata NOT DETECTED NOT DETECTED Final   Candida krusei NOT DETECTED NOT DETECTED Final   Candida parapsilosis NOT DETECTED NOT DETECTED Final   Candida tropicalis NOT DETECTED NOT DETECTED Final    Comment: Performed at [facility], [place] [address] [place], [place] [address]  Blood culture (routine x 2)     Status: None (Preliminary result)   Collection Time: 10/20/18 12:55 PM  Result Value Ref Range Status   Specimen Description BLOOD RIGHT ANTECUBITAL  Final   Special Requests   Final    BOTTLES DRAWN AEROBIC AND ANAEROBIC Blood Culture adequate volume   Culture   Final    NO GROWTH 3 DAYS Performed at [facility], [place] [address], [place], [place] [address]    Report Status PENDING  Incomplete  SARS Coronavirus 2 (CEPHEID - Performed in [place] [facility] lab), Hosp Order     Status: None   Collection Time: 10/20/18  1:28 PM  Result Value Ref Range Status   SARS Coronavirus 2 NEGATIVE NEGATIVE Final    Comment: (NOTE) If result is NEGATIVE SARS-CoV-2 target nucleic acids are NOT DETECTED. The SARS-CoV-2 RNA is generally detectable in upper and lower  respiratory specimens during the acute phase of infection. The lowest  concentration of  SARS-CoV-2 viral copies this assay can detect is 250  copies / mL. A negative result does not preclude SARS-CoV-2 infection  and should not be used as the sole basis for treatment or other  patient management decisions.  A negative result may occur with  improper specimen collection / handling,  submission of specimen other  than nasopharyngeal swab, presence of viral mutation(s) within the  areas targeted by this assay, and inadequate number of viral copies  (<250 copies / mL). A negative result must be combined with clinical  observations, patient history, and epidemiological information. If result is POSITIVE SARS-CoV-2 target nucleic acids are DETECTED. The SARS-CoV-2 RNA is generally detectable in upper and lower  respiratory specimens dur ing the acute phase of infection.  Positive  results are indicative of active infection with SARS-CoV-2.  Clinical  correlation with patient history and other diagnostic information is  necessary to determine patient infection status.  Positive results do  not rule out bacterial infection or co-infection with other viruses. If result is PRESUMPTIVE POSTIVE SARS-CoV-2 nucleic acids MAY BE PRESENT.   A presumptive positive result was obtained on the submitted specimen  and confirmed on repeat testing.  While 2019 novel coronavirus  (SARS-CoV-2) nucleic acids may be present in the submitted sample  additional confirmatory testing may be necessary for epidemiological  and / or clinical management purposes  to differentiate between  SARS-CoV-2 and other Sarbecovirus currently known to infect humans.  If clinically indicated additional testing with an alternate test  methodology [id number]) is advised. The SARS-CoV-2 RNA is generally  detectable in upper and lower respiratory sp ecimens during the acute  phase of infection. The expected result is Negative. Fact Sheet for Patients:  StrictlyIdeas.no Fact Sheet for Healthcare Providers: BankingDealers.co.za This test is not yet approved or cleared by the [place] FDA and has been authorized for detection and/or diagnosis of SARS-CoV-2 by FDA under an Emergency Use Authorization (EUA).  This EUA will remain in effect (meaning this test can be  used) for the duration of the COVID-19 declaration under Section 564(b)(1) of the Act, 21 U.S.C. section 360bbb-3(b)(1), unless the authorization is terminated or revoked sooner. Performed at [facility], [place] [address], [place], [place] [address]   MRSA PCR Screening     Status: Abnormal   Collection Time: 10/21/18  9:33 AM  Result Value Ref Range Status   MRSA by PCR POSITIVE (A) NEGATIVE Final    Comment:        The GeneXpert MRSA Assay (FDA approved for NASAL specimens only), is one component of a comprehensive MRSA colonization surveillance program. It is not intended to diagnose MRSA infection nor to guide or monitor treatment for MRSA infections. RESULT CALLED TO, READ BACK BY AND VERIFIED WITH: Tenny Craw RN 11:45 10/21/18 ([email or web address]) Performed at [facility], [place] [address], [place], [place] [address]      Labs: BNP (last 3 results) No results for input(s): BNP in the last 8760 hours. Basic Metabolic Panel: Recent Labs  Lab 10/20/18 1110 10/21/18 0416 10/21/18 1242 10/22/18 0406  NA 133* 135 135 135  K 2.6* 2.6* 3.0* 3.1*  CL 90* 98 99 102  CO2 27 28 27 23   GLUCOSE 133* 99 116* 88  BUN 21 15 11 9   CREATININE 1.12* 0.95 0.82 0.79  CALCIUM 8.7* 7.8* 7.6* 7.6*  MG  --  2.2  --  2.2  PHOS  --   --   --  3.5  Liver Function Tests: Recent Labs  Lab 10/20/18 1110 10/22/18 0406  AST 25  --   ALT 32  --   ALKPHOS 122  --   BILITOT 1.5*  --   PROT 7.6  --   ALBUMIN 3.5 2.4*   Recent Labs  Lab 10/20/18 1110  LIPASE 28   No results for input(s): AMMONIA in the last 168 hours. CBC: Recent Labs  Lab 10/20/18 1110 10/20/18 2114 10/21/18 0416 10/21/18 1242 10/22/18 0406  WBC 25.6* 14.3* 13.6* 10.1 7.0  HGB 6.7* 9.7* 9.9* 9.2* 9.4*  HCT 22.3* 31.5* 32.2* 30.4* 31.1*  MCV 88.1 86.8 88.0 88.1 88.4  PLT 238 PLATELET CLUMPS NOTED ON SMEAR, COUNT APPEARS ADEQUATE 81* PLATELET CLUMPS NOTED ON SMEAR, UNABLE TO ESTIMATE PLATELET CLUMPS NOTED ON  SMEAR, UNABLE TO ESTIMATE   Cardiac Enzymes: Recent Labs  Lab 10/20/18 1110  TROPONINI <0.03   BNP: Invalid input(s): POCBNP CBG: Recent Labs  Lab 10/20/18 1053  GLUCAP 130*   D-Dimer No results for input(s): DDIMER in the last 72 hours. Hgb A1c No results for input(s): HGBA1C in the last 72 hours. Lipid Profile No results for input(s): CHOL, HDL, LDLCALC, TRIG, CHOLHDL, LDLDIRECT in the last 72 hours. Thyroid function studies No results for input(s): TSH, T4TOTAL, T3FREE, THYROIDAB in the last 72 hours.  Invalid input(s): FREET3 Anemia work up No results for input(s): VITAMINB12, FOLATE, FERRITIN, TIBC, IRON, RETICCTPCT in the last 72 hours. Urinalysis    Component Value Date/Time   COLORURINE YELLOW 10/20/2018 1614   APPEARANCEUR CLOUDY (A) 10/20/2018 1614   LABSPEC 1.045 (H) 10/20/2018 1614   LABSPEC 1.010 06/09/2007 1500   PHURINE 6.0 10/20/2018 1614   GLUCOSEU NEGATIVE 10/20/2018 1614   GLUCOSEU NEGATIVE 02/16/2018 1202   HGBUR MODERATE (A) 10/20/2018 1614   HGBUR negative 05/09/2010 1026   BILIRUBINUR NEGATIVE 10/20/2018 1614   BILIRUBINUR N 05/01/2016 1104   BILIRUBINUR Negative 06/09/2007 1500   KETONESUR NEGATIVE 10/20/2018 1614   PROTEINUR 30 (A) 10/20/2018 1614   UROBILINOGEN 0.2 02/16/2018 1202   NITRITE NEGATIVE 10/20/2018 1614   LEUKOCYTESUR LARGE (A) 10/20/2018 1614   LEUKOCYTESUR Negative 06/09/2007 1500   Sepsis Labs Invalid input(s): PROCALCITONIN,  WBC,  LACTICIDVEN Microbiology Recent Results (from the past 240 hour(s))  Blood culture (routine x 2)     Status: Abnormal   Collection Time: 10/20/18 11:10 AM  Result Value Ref Range Status   Specimen Description BLOOD LEFT HAND  Final   Special Requests   Final    BOTTLES DRAWN AEROBIC AND ANAEROBIC Blood Culture adequate volume   Culture  Setup Time   Final    GRAM NEGATIVE RODS AEROBIC BOTTLE ONLY CRITICAL RESULT CALLED TO, READ BACK BY AND VERIFIED WITH: Shellee Milo PHARMD, AT 2297 10/21/18  BY Rush Landmark Performed at [facility], [place] [address] [place], [place] [address]    Culture ESCHERICHIA COLI (A)  Final   Report Status 10/23/2018 FINAL  Final   Organism ID, Bacteria ESCHERICHIA COLI  Final      Susceptibility   Escherichia coli - MIC*    AMPICILLIN <=2 SENSITIVE Sensitive     CEFAZOLIN <=4 SENSITIVE Sensitive     CEFEPIME <=1 SENSITIVE Sensitive     CEFTAZIDIME <=1 SENSITIVE Sensitive     CEFTRIAXONE <=1 SENSITIVE Sensitive     CIPROFLOXACIN <=0.25 SENSITIVE Sensitive     GENTAMICIN <=1 SENSITIVE Sensitive     IMIPENEM <=0.25 SENSITIVE Sensitive     TRIMETH/SULFA <=20 SENSITIVE  Sensitive     AMPICILLIN/SULBACTAM <=2 SENSITIVE Sensitive     PIP/TAZO <=4 SENSITIVE Sensitive     Extended ESBL NEGATIVE Sensitive     * ESCHERICHIA COLI  Blood Culture ID Panel (Reflexed)     Status: Abnormal   Collection Time: 10/20/18 11:10 AM  Result Value Ref Range Status   Enterococcus species NOT DETECTED NOT DETECTED Final   Listeria monocytogenes NOT DETECTED NOT DETECTED Final   Staphylococcus species NOT DETECTED NOT DETECTED Final   Staphylococcus aureus (BCID) NOT DETECTED NOT DETECTED Final   Streptococcus species NOT DETECTED NOT DETECTED Final   Streptococcus agalactiae NOT DETECTED NOT DETECTED Final   Streptococcus pneumoniae NOT DETECTED NOT DETECTED Final   Streptococcus pyogenes NOT DETECTED NOT DETECTED Final   Acinetobacter baumannii NOT DETECTED NOT DETECTED Final   Enterobacteriaceae species DETECTED (A) NOT DETECTED Final    Comment: Enterobacteriaceae represent a large family of gram-negative bacteria, not a single organism. CRITICAL RESULT CALLED TO, READ BACK BY AND VERIFIED WITH: L. SEAY PHARMD, AT 0705 10/21/18 BY D. VANHOOK    Enterobacter cloacae complex NOT DETECTED NOT DETECTED Final   Escherichia coli DETECTED (A) NOT DETECTED Final    Comment: CRITICAL RESULT CALLED TO, READ BACK BY AND VERIFIED WITH: L. SEAY PHARMD, AT 8119 10/21/18 BY  D. VANHOOK    Klebsiella oxytoca NOT DETECTED NOT DETECTED Final   Klebsiella pneumoniae NOT DETECTED NOT DETECTED Final   Proteus species NOT DETECTED NOT DETECTED Final   Serratia marcescens NOT DETECTED NOT DETECTED Final   Carbapenem resistance NOT DETECTED NOT DETECTED Final   Haemophilus influenzae NOT DETECTED NOT DETECTED Final   Neisseria meningitidis NOT DETECTED NOT DETECTED Final   Pseudomonas aeruginosa NOT DETECTED NOT DETECTED Final   Candida albicans NOT DETECTED NOT DETECTED Final   Candida glabrata NOT DETECTED NOT DETECTED Final   Candida krusei NOT DETECTED NOT DETECTED Final   Candida parapsilosis NOT DETECTED NOT DETECTED Final   Candida tropicalis NOT DETECTED NOT DETECTED Final    Comment: Performed at [facility], [place] [address] [place], [place] [address]  Blood culture (routine x 2)     Status: None (Preliminary result)   Collection Time: 10/20/18 12:55 PM  Result Value Ref Range Status   Specimen Description BLOOD RIGHT ANTECUBITAL  Final   Special Requests   Final    BOTTLES DRAWN AEROBIC AND ANAEROBIC Blood Culture adequate volume   Culture   Final    NO GROWTH 3 DAYS Performed at [facility], [place] [address] [place], [place] [address]    Report Status PENDING  Incomplete  SARS Coronavirus 2 (CEPHEID - Performed in [facility] [facility] hospital lab), Hosp Order     Status: None   Collection Time: 10/20/18  1:28 PM  Result Value Ref Range Status   SARS Coronavirus 2 NEGATIVE NEGATIVE Final    Comment: (NOTE) If result is NEGATIVE SARS-CoV-2 target nucleic acids are NOT DETECTED. The SARS-CoV-2 RNA is generally detectable in upper and lower  respiratory specimens during the acute phase of infection. The lowest  concentration of SARS-CoV-2 viral copies this assay can detect is 250  copies / mL. A negative result does not preclude SARS-CoV-2 infection  and should not be used as the sole basis for treatment or other  patient management decisions.   A negative result may occur with  improper specimen collection / handling, submission of specimen other  than nasopharyngeal swab, presence of viral mutation(s) within the  areas targeted by this assay, and inadequate number of viral copies  (<250 copies / mL). A negative result must be combined with clinical  observations, patient history, and epidemiological information. If result is POSITIVE SARS-CoV-2 target nucleic acids are DETECTED. The SARS-CoV-2 RNA is generally detectable in upper and lower  respiratory specimens dur ing the acute phase of infection.  Positive  results are indicative of active infection with SARS-CoV-2.  Clinical  correlation with patient history and other diagnostic information is  necessary to determine patient infection status.  Positive results do  not rule out bacterial infection or co-infection with other viruses. If result is PRESUMPTIVE POSTIVE SARS-CoV-2 nucleic acids MAY BE PRESENT.   A presumptive positive result was obtained on the submitted specimen  and confirmed on repeat testing.  While 2019 novel coronavirus  (SARS-CoV-2) nucleic acids may be present in the submitted sample  additional confirmatory testing may be necessary for epidemiological  and / or clinical management purposes  to differentiate between  SARS-CoV-2 and other Sarbecovirus currently known to infect humans.  If clinically indicated additional testing with an alternate test  methodology [phone]) is advised. The SARS-CoV-2 RNA is generally  detectable in upper and lower respiratory sp ecimens during the acute  phase of infection. The expected result is Negative. Fact Sheet for Patients:  StrictlyIdeas.no Fact Sheet for Healthcare Providers: BankingDealers.co.za This test is not yet approved or cleared by the [place] FDA and has been authorized for detection and/or diagnosis of SARS-CoV-2 by FDA under an Emergency Use  Authorization (EUA).  This EUA will remain in effect (meaning this test can be used) for the duration of the COVID-19 declaration under Section 564(b)(1) of the Act, 21 U.S.C. section 360bbb-3(b)(1), unless the authorization is terminated or revoked sooner. Performed at [facility], [place] [address], [place], [place] [address]   MRSA PCR Screening     Status: Abnormal   Collection Time: 10/21/18  9:33 AM  Result Value Ref Range Status   MRSA by PCR POSITIVE (A) NEGATIVE Final    Comment:        The GeneXpert MRSA Assay (FDA approved for NASAL specimens only), is one component of a comprehensive MRSA colonization surveillance program. It is not intended to diagnose MRSA infection nor to guide or monitor treatment for MRSA infections. RESULT CALLED TO, READ BACK BY AND VERIFIED WITH: Tenny Craw RN 11:45 10/21/18 ([email or web address]) Performed at [facility], [place] [address], [place],  [address]      Time coordinating discharge: 35 minutes spent in the coordination of this discharge.   SIGNED:   Jonnie Finner, DO  [facility] 10/24/2018, 7:34 AM Pager   If 7PM-7AM, please contact night-coverage [email or web address] Password [email or web address]

## 2018-10-24 NOTE — Progress Notes (Signed)
Patient was discharged home with home health. Discharge packet with medication education given with teach back.  Questions and concerns were answered. Patient's belongings in bag: cell phone, charger, creams with pt, gown on. Peripheral iv removed clean dry and intact, pressure and dressing applied.  Pt was transported in wheelchair with belongings to [facility] entrance by nurse tech.   Julianne Handler, RN

## 2018-10-25 ENCOUNTER — Telehealth: Payer: Self-pay | Admitting: Family

## 2018-10-25 ENCOUNTER — Telehealth: Payer: Self-pay | Admitting: *Deleted

## 2018-10-25 LAB — CULTURE, BLOOD (ROUTINE X 2)
Culture: NO GROWTH
Special Requests: ADEQUATE

## 2018-10-25 NOTE — Telephone Encounter (Signed)
Please contact pt to arrange hospital follow up with me in 1 week. (virtual).

## 2018-10-25 NOTE — Telephone Encounter (Signed)
That is fine to see her on 6/2. Thanks.

## 2018-10-25 NOTE — Telephone Encounter (Signed)
Transition Care Management Follow-up Telephone Call  Date discharged?10/24/18  How have you been since you were released from the hospital? Doing better per pt.   Do you understand why you were in the hospital? yes   Do you understand the discharge instructions? yes   Where were you discharged to? Home with friend.   Items Reviewed:  Medications reviewed: Reports they gave her a new abx. Can't recall name. Will have list tomorrow at appt time.  Allergies reviewed: pt states she had a reaction to abx while in hospital. Can't recall. Will have name tomorrow at appt.  Dietary changes reviewed: yes  Referrals reviewed: yes   Functional Questionnaire:   Activities of Daily Living (ADLs):   She states they are independent in the following: ambulation, bathing and hygiene, feeding, continence, grooming, toileting and dressing States they require assistance with the following: na   Any transportation issues/concerns?: no   Any patient concerns? no   Confirmed importance and date/time of follow-up visits scheduled yes  Provider Appointment booked with 10/26/18 @420  w/ PCP  Confirmed with patient if condition begins to worsen call PCP or go to the ER.  Patient was given the office number and encouraged to call back with question or concerns.  : yes

## 2018-10-26 ENCOUNTER — Other Ambulatory Visit: Payer: Self-pay

## 2018-10-26 ENCOUNTER — Ambulatory Visit (INDEPENDENT_AMBULATORY_CARE_PROVIDER_SITE_OTHER): Payer: 59 | Admitting: Family

## 2018-10-26 DIAGNOSIS — E876 Hypokalemia: Secondary | ICD-10-CM

## 2018-10-26 DIAGNOSIS — D649 Anemia, unspecified: Secondary | ICD-10-CM

## 2018-10-26 DIAGNOSIS — A4151 Sepsis due to Escherichia coli [E. coli]: Secondary | ICD-10-CM

## 2018-10-26 NOTE — Progress Notes (Signed)
Virtual Visit via Video Note  I connected with [name] on 10/26/18 at  4:20 PM EDT by a video enabled telemedicine application and verified that I am speaking with the correct person using two identifiers. This visit type was conducted due to national recommendations for restrictions regarding the COVID-19 Pandemic (e.g. social distancing).  This format is felt to be most appropriate for this patient at this time.   I discussed the limitations of evaluation and management by telemedicine and the availability of in person appointments. The patient expressed understanding and agreed to proceed.  Only the patient and myself were on today's video visit. The patient was at home and I was in my office at the time of today's visit.   History of Present Illness:  Pt presents today for hospital follow up visit. Discharge summary is reviewed. She presented with E coli bacteremia/septic shock. It appears that she passed a right sided kidney stone prior to admission due to finding of mild hydronephrosis/hydroureter with stranding about the right kidney noted on CT. Was treated with vanc/cefepime.  Had drug rash on keflex. Was transitioned to bactrim bid with end date 11/03/18. - received 1 unit of PRBC.  Eliquis was resumed.   Since returning home she reports feeling "much better." Specifically she denies fever, nausea/vomitting, frequency of urination or unusual low back pain.   Has home health PT coming to her home several days a week. Reports that she is at home but her niece has been coming to help her.  Reports bp has been stable around 329 systolic  BP Readings from Last 3 Encounters:  10/24/18 125/60  08/04/18 (!) 126/55  07/21/18 (!) 141/47      Observations/Objective:  Gen: Awake, alert, no acute distress Resp: Breathing is even and non-labored Psych: calm/pleasant demeanor Neuro: Alert and Oriented x 3, + facial symmetry, speech is clear.  Assessment and Plan:  HTN- BP is stable.  Continue current meds.  E coli sepsis-suspect urinary origin given finding or recent passage of kidney stone on CT. Urine culture was not performed. She is clinically improved. Continue keflex.  Continue PT.   Hypokalemia- continues potassium supplement. Plan to repeat bmet.   Anemia- clinically stable. Repeat cbc.   Follow Up Instructions:    I discussed the assessment and treatment plan with the patient. The patient was provided an opportunity to ask questions and all were answered. The patient agreed with the plan and demonstrated an understanding of the instructions.   The patient was advised to call back or seek an in-person evaluation if the symptoms worsen or if the condition fails to improve as anticipated.    Nance Pear, NP

## 2018-11-03 ENCOUNTER — Other Ambulatory Visit (INDEPENDENT_AMBULATORY_CARE_PROVIDER_SITE_OTHER): Payer: 59

## 2018-11-03 ENCOUNTER — Other Ambulatory Visit: Payer: Self-pay

## 2018-11-03 DIAGNOSIS — D649 Anemia, unspecified: Secondary | ICD-10-CM

## 2018-11-03 DIAGNOSIS — E876 Hypokalemia: Secondary | ICD-10-CM

## 2018-11-03 NOTE — Addendum Note (Signed)
Addended by: Kelle Darting A on: 11/03/2018 02:26 PM   Modules accepted: Orders

## 2018-11-04 LAB — COMPREHENSIVE METABOLIC PANEL
ALT: 28 U/L (ref 0–35)
AST: 22 U/L (ref 0–37)
Albumin: 4.1 g/dL (ref 3.5–5.2)
Alkaline Phosphatase: 122 U/L — ABNORMAL HIGH (ref 39–117)
BUN: 21 mg/dL (ref 6–23)
CO2: 30 mEq/L (ref 19–32)
Calcium: 9 mg/dL (ref 8.4–10.5)
Chloride: 91 mEq/L — ABNORMAL LOW (ref 96–112)
Creatinine, Ser: 0.98 mg/dL (ref 0.40–1.20)
GFR: 57.6 mL/min — ABNORMAL LOW (ref >60.00)
Glucose, Bld: 89 mg/dL (ref 70–99)
Potassium: 3.5 mEq/L (ref 3.5–5.1)
Sodium: 135 mEq/L (ref 135–145)
Total Bilirubin: 0.7 mg/dL (ref 0.2–1.2)
Total Protein: 7.2 g/dL (ref 6.0–8.3)

## 2018-11-04 LAB — CBC WITH DIFFERENTIAL/PLATELET
Basophils Absolute: 0.1 10*3/uL (ref 0.0–0.1)
Basophils Relative: 1.1 % (ref 0.0–3.0)
Eosinophils Absolute: 0.3 10*3/uL (ref 0.0–0.7)
Eosinophils Relative: 2.5 % (ref 0.0–5.0)
HCT: 37 % (ref 36.0–46.0)
Hemoglobin: 11.7 g/dL — ABNORMAL LOW (ref 12.0–15.0)
Lymphocytes Relative: 10.9 % — ABNORMAL LOW (ref 12.0–46.0)
Lymphs Abs: 1.2 10*3/uL (ref 0.7–4.0)
MCHC: 31.6 g/dL (ref 30.0–36.0)
MCV: 84.7 fl (ref 78.0–100.0)
Monocytes Absolute: 0.5 10*3/uL (ref 0.1–1.0)
Monocytes Relative: 4.5 % (ref 3.0–12.0)
Neutro Abs: 8.9 10*3/uL — ABNORMAL HIGH (ref 1.4–7.7)
Neutrophils Relative %: 81 % — ABNORMAL HIGH (ref 43.0–77.0)
Platelets: 392 10*3/uL (ref 150.0–400.0)
RBC: 4.37 Mil/uL (ref 3.87–5.11)
RDW: 19.1 % — ABNORMAL HIGH (ref 11.5–15.5)
WBC: 11 10*3/uL — ABNORMAL HIGH (ref 4.0–10.5)

## 2018-11-05 ENCOUNTER — Encounter: Payer: Self-pay | Admitting: Family

## 2018-11-10 ENCOUNTER — Telehealth: Payer: Self-pay | Admitting: *Deleted

## 2018-11-10 NOTE — Telephone Encounter (Signed)
Copied from [place] [id number]. Topic: General - Inquiry >> Nov 10, 2018 10:29 AM Richardo Priest, NT wrote: Reason for CRM: Patient is calling in requesting she be out of work until July 7-8. States she is still in physical therapy and would like more time before going back to work. Call back is [phone].

## 2018-11-10 NOTE — Telephone Encounter (Signed)
Please advise pt that I have written work note and posted it to her mychart account.

## 2018-11-11 NOTE — Telephone Encounter (Signed)
Patient advised work note available for her in [place]

## 2018-11-16 ENCOUNTER — Ambulatory Visit: Payer: 59

## 2018-11-21 ENCOUNTER — Emergency Department (HOSPITAL_COMMUNITY): Payer: 59

## 2018-11-21 ENCOUNTER — Other Ambulatory Visit: Payer: Self-pay

## 2018-11-21 ENCOUNTER — Encounter (HOSPITAL_COMMUNITY): Payer: Self-pay | Admitting: Emergency Medicine

## 2018-11-21 ENCOUNTER — Ambulatory Visit (INDEPENDENT_AMBULATORY_CARE_PROVIDER_SITE_OTHER)
Admission: EM | Admit: 2018-11-21 | Discharge: 2018-11-21 | Disposition: A | Discharge status: Home / Self Care | Payer: 59 | Source: Home / Self Care

## 2018-11-21 ENCOUNTER — Inpatient Hospital Stay (HOSPITAL_COMMUNITY)
Admission: EM | Admit: 2018-11-21 | Discharge: 2018-11-25 | DRG: 872 | Disposition: A | Discharge status: Home / Self Care | Payer: 59 | Attending: Internal Medicine | Admitting: Internal Medicine

## 2018-11-21 DIAGNOSIS — Z8249 Family history of ischemic heart disease and other diseases of the circulatory system: Secondary | ICD-10-CM

## 2018-11-21 DIAGNOSIS — Z87892 Personal history of anaphylaxis: Secondary | ICD-10-CM

## 2018-11-21 DIAGNOSIS — N10 Acute tubulo-interstitial nephritis: Secondary | ICD-10-CM | POA: Diagnosis present

## 2018-11-21 DIAGNOSIS — Z6841 Body Mass Index (BMI) 40.0 and over, adult: Secondary | ICD-10-CM | POA: Diagnosis not present

## 2018-11-21 DIAGNOSIS — Z89421 Acquired absence of other right toe(s): Secondary | ICD-10-CM

## 2018-11-21 DIAGNOSIS — K5790 Diverticulosis of intestine, part unspecified, without perforation or abscess without bleeding: Secondary | ICD-10-CM | POA: Diagnosis present

## 2018-11-21 DIAGNOSIS — M79606 Pain in leg, unspecified: Secondary | ICD-10-CM | POA: Diagnosis present

## 2018-11-21 DIAGNOSIS — G4733 Obstructive sleep apnea (adult) (pediatric): Secondary | ICD-10-CM | POA: Diagnosis present

## 2018-11-21 DIAGNOSIS — E892 Postprocedural hypoparathyroidism: Secondary | ICD-10-CM | POA: Diagnosis not present

## 2018-11-21 DIAGNOSIS — E039 Hypothyroidism, unspecified: Secondary | ICD-10-CM | POA: Diagnosis present

## 2018-11-21 DIAGNOSIS — Z87442 Personal history of urinary calculi: Secondary | ICD-10-CM

## 2018-11-21 DIAGNOSIS — Z91018 Allergy to other foods: Secondary | ICD-10-CM

## 2018-11-21 DIAGNOSIS — Z20828 Contact with and (suspected) exposure to other viral communicable diseases: Secondary | ICD-10-CM | POA: Diagnosis present

## 2018-11-21 DIAGNOSIS — Z79899 Other long term (current) drug therapy: Secondary | ICD-10-CM

## 2018-11-21 DIAGNOSIS — K760 Fatty (change of) liver, not elsewhere classified: Secondary | ICD-10-CM | POA: Diagnosis present

## 2018-11-21 DIAGNOSIS — E872 Acidosis: Secondary | ICD-10-CM | POA: Diagnosis present

## 2018-11-21 DIAGNOSIS — I10 Essential (primary) hypertension: Secondary | ICD-10-CM | POA: Diagnosis not present

## 2018-11-21 DIAGNOSIS — R509 Fever, unspecified: Secondary | ICD-10-CM

## 2018-11-21 DIAGNOSIS — A4151 Sepsis due to Escherichia coli [E. coli]: Principal | ICD-10-CM | POA: Diagnosis present

## 2018-11-21 DIAGNOSIS — E038 Other specified hypothyroidism: Secondary | ICD-10-CM | POA: Diagnosis not present

## 2018-11-21 DIAGNOSIS — R531 Weakness: Secondary | ICD-10-CM | POA: Diagnosis not present

## 2018-11-21 DIAGNOSIS — Z801 Family history of malignant neoplasm of trachea, bronchus and lung: Secondary | ICD-10-CM

## 2018-11-21 DIAGNOSIS — I110 Hypertensive heart disease with heart failure: Secondary | ICD-10-CM | POA: Diagnosis present

## 2018-11-21 DIAGNOSIS — E559 Vitamin D deficiency, unspecified: Secondary | ICD-10-CM | POA: Diagnosis present

## 2018-11-21 DIAGNOSIS — Z8542 Personal history of malignant neoplasm of other parts of uterus: Secondary | ICD-10-CM

## 2018-11-21 DIAGNOSIS — I700 Atherosclerosis of aorta: Secondary | ICD-10-CM | POA: Diagnosis present

## 2018-11-21 DIAGNOSIS — N200 Calculus of kidney: Secondary | ICD-10-CM | POA: Diagnosis present

## 2018-11-21 DIAGNOSIS — D509 Iron deficiency anemia, unspecified: Secondary | ICD-10-CM | POA: Diagnosis present

## 2018-11-21 DIAGNOSIS — J449 Chronic obstructive pulmonary disease, unspecified: Secondary | ICD-10-CM | POA: Diagnosis present

## 2018-11-21 DIAGNOSIS — I480 Paroxysmal atrial fibrillation: Secondary | ICD-10-CM | POA: Diagnosis not present

## 2018-11-21 DIAGNOSIS — E209 Hypoparathyroidism, unspecified: Secondary | ICD-10-CM | POA: Diagnosis present

## 2018-11-21 DIAGNOSIS — K219 Gastro-esophageal reflux disease without esophagitis: Secondary | ICD-10-CM | POA: Diagnosis present

## 2018-11-21 DIAGNOSIS — Z8614 Personal history of Methicillin resistant Staphylococcus aureus infection: Secondary | ICD-10-CM

## 2018-11-21 DIAGNOSIS — Z87440 Personal history of urinary (tract) infections: Secondary | ICD-10-CM

## 2018-11-21 DIAGNOSIS — I5032 Chronic diastolic (congestive) heart failure: Secondary | ICD-10-CM | POA: Diagnosis present

## 2018-11-21 DIAGNOSIS — Z66 Do not resuscitate: Secondary | ICD-10-CM | POA: Diagnosis present

## 2018-11-21 DIAGNOSIS — G8929 Other chronic pain: Secondary | ICD-10-CM | POA: Diagnosis present

## 2018-11-21 DIAGNOSIS — I482 Chronic atrial fibrillation, unspecified: Secondary | ICD-10-CM | POA: Diagnosis present

## 2018-11-21 DIAGNOSIS — Z79890 Hormone replacement therapy (postmenopausal): Secondary | ICD-10-CM

## 2018-11-21 DIAGNOSIS — E876 Hypokalemia: Secondary | ICD-10-CM

## 2018-11-21 DIAGNOSIS — Z825 Family history of asthma and other chronic lower respiratory diseases: Secondary | ICD-10-CM

## 2018-11-21 DIAGNOSIS — A419 Sepsis, unspecified organism: Secondary | ICD-10-CM | POA: Diagnosis not present

## 2018-11-21 DIAGNOSIS — N12 Tubulo-interstitial nephritis, not specified as acute or chronic: Secondary | ICD-10-CM | POA: Diagnosis not present

## 2018-11-21 DIAGNOSIS — E890 Postprocedural hypothyroidism: Secondary | ICD-10-CM | POA: Diagnosis present

## 2018-11-21 DIAGNOSIS — Z9884 Bariatric surgery status: Secondary | ICD-10-CM

## 2018-11-21 DIAGNOSIS — M797 Fibromyalgia: Secondary | ICD-10-CM | POA: Diagnosis present

## 2018-11-21 DIAGNOSIS — Z881 Allergy status to other antibiotic agents status: Secondary | ICD-10-CM

## 2018-11-21 DIAGNOSIS — Z880 Allergy status to penicillin: Secondary | ICD-10-CM

## 2018-11-21 DIAGNOSIS — Z85850 Personal history of malignant neoplasm of thyroid: Secondary | ICD-10-CM

## 2018-11-21 DIAGNOSIS — Z87891 Personal history of nicotine dependence: Secondary | ICD-10-CM

## 2018-11-21 DIAGNOSIS — N390 Urinary tract infection, site not specified: Secondary | ICD-10-CM | POA: Diagnosis not present

## 2018-11-21 DIAGNOSIS — Z7901 Long term (current) use of anticoagulants: Secondary | ICD-10-CM

## 2018-11-21 DIAGNOSIS — Z8701 Personal history of pneumonia (recurrent): Secondary | ICD-10-CM

## 2018-11-21 DIAGNOSIS — I4811 Longstanding persistent atrial fibrillation: Secondary | ICD-10-CM | POA: Diagnosis not present

## 2018-11-21 DIAGNOSIS — Z7951 Long term (current) use of inhaled steroids: Secondary | ICD-10-CM

## 2018-11-21 LAB — URINALYSIS, ROUTINE W REFLEX MICROSCOPIC
Bacteria, UA: NONE SEEN
Bilirubin Urine: NEGATIVE
Glucose, UA: NEGATIVE mg/dL
Ketones, ur: NEGATIVE mg/dL
Nitrite: POSITIVE — AB
Protein, ur: NEGATIVE mg/dL
Specific Gravity, Urine: 1.027 (ref 1.005–1.030)
WBC, UA: >50 WBC/hpf — ABNORMAL HIGH (ref 0–5)
pH: 6 (ref 5.0–8.0)

## 2018-11-21 LAB — CBC WITH DIFFERENTIAL/PLATELET
Abs Immature Granulocytes: 0.18 10*3/uL — ABNORMAL HIGH (ref 0.00–0.07)
Basophils Absolute: 0 10*3/uL (ref 0.0–0.1)
Basophils Relative: 0 %
Eosinophils Absolute: 0.1 10*3/uL (ref 0.0–0.5)
Eosinophils Relative: 0 %
HCT: 35.6 % — ABNORMAL LOW (ref 36.0–46.0)
Hemoglobin: 10.8 g/dL — ABNORMAL LOW (ref 12.0–15.0)
Immature Granulocytes: 1 %
Lymphocytes Relative: 4 %
Lymphs Abs: 0.8 10*3/uL (ref 0.7–4.0)
MCH: 26.5 pg (ref 26.0–34.0)
MCHC: 30.3 g/dL (ref 30.0–36.0)
MCV: 87.5 fL (ref 80.0–100.0)
Monocytes Absolute: 0.9 10*3/uL (ref 0.1–1.0)
Monocytes Relative: 5 %
Neutro Abs: 16.3 10*3/uL — ABNORMAL HIGH (ref 1.7–7.7)
Neutrophils Relative %: 90 %
Platelets: 186 10*3/uL (ref 150–400)
RBC: 4.07 MIL/uL (ref 3.87–5.11)
RDW: 17.9 % — ABNORMAL HIGH (ref 11.5–15.5)
WBC: 18.3 10*3/uL — ABNORMAL HIGH (ref 4.0–10.5)
nRBC: 0 % (ref 0.0–0.2)

## 2018-11-21 LAB — MAGNESIUM: Magnesium: 1.9 mg/dL (ref 1.7–2.4)

## 2018-11-21 LAB — COMPREHENSIVE METABOLIC PANEL
ALT: 34 U/L (ref 0–44)
AST: 25 U/L (ref 15–41)
Albumin: 3.5 g/dL (ref 3.5–5.0)
Alkaline Phosphatase: 116 U/L (ref 38–126)
Anion gap: 17 — ABNORMAL HIGH (ref 5–15)
BUN: 25 mg/dL — ABNORMAL HIGH (ref 8–23)
CO2: 26 mmol/L (ref 22–32)
Calcium: 8.5 mg/dL — ABNORMAL LOW (ref 8.9–10.3)
Chloride: 91 mmol/L — ABNORMAL LOW (ref 98–111)
Creatinine, Ser: 1.02 mg/dL — ABNORMAL HIGH (ref 0.44–1.00)
GFR calc Af Amer: >60 mL/min (ref >60)
GFR calc non Af Amer: 59 mL/min — ABNORMAL LOW (ref >60)
Glucose, Bld: 117 mg/dL — ABNORMAL HIGH (ref 70–99)
Potassium: 2.4 mmol/L — CL (ref 3.5–5.1)
Sodium: 134 mmol/L — ABNORMAL LOW (ref 135–145)
Total Bilirubin: 1.2 mg/dL (ref 0.3–1.2)
Total Protein: 7.4 g/dL (ref 6.5–8.1)

## 2018-11-21 LAB — SARS CORONAVIRUS 2 BY RT PCR (HOSPITAL ORDER, PERFORMED IN ~~LOC~~ HOSPITAL LAB): SARS Coronavirus 2: NEGATIVE

## 2018-11-21 LAB — AMMONIA: Ammonia: 15 umol/L (ref 9–35)

## 2018-11-21 LAB — BRAIN NATRIURETIC PEPTIDE: B Natriuretic Peptide: 56.3 pg/mL (ref 0.0–100.0)

## 2018-11-21 LAB — TROPONIN I (HIGH SENSITIVITY): Troponin I (High Sensitivity): 8 ng/L (ref <18)

## 2018-11-21 LAB — LACTIC ACID, PLASMA: Lactic Acid, Venous: 2.1 mmol/L (ref 0.5–1.9)

## 2018-11-21 MED ORDER — POTASSIUM CHLORIDE CRYS ER 20 MEQ PO TBCR
40.0000 meq | EXTENDED_RELEASE_TABLET | Freq: Once | ORAL | Status: DC
  Filled 2018-11-21: qty 2

## 2018-11-21 MED ORDER — POTASSIUM CHLORIDE 10 MEQ/100ML IV SOLN
10.0000 meq | INTRAVENOUS | Status: AC
  Administered 2018-11-21 – 2018-11-22 (×4): 10 meq via INTRAVENOUS
  Filled 2018-11-21 (×5): qty 100

## 2018-11-21 MED ORDER — IOHEXOL 300 MG/ML  SOLN
125.0000 mL | Freq: Once | INTRAMUSCULAR | Status: AC | PRN
  Administered 2018-11-21: 20:00:00 125 mL via INTRAVENOUS

## 2018-11-21 MED ORDER — FOSFOMYCIN TROMETHAMINE 3 G PO PACK
3.0000 g | PACK | Freq: Once | ORAL | Status: AC
  Administered 2018-11-21: 3 g via ORAL
  Filled 2018-11-21: qty 3

## 2018-11-21 MED ORDER — ACETAMINOPHEN 325 MG PO TABS
650.0000 mg | ORAL_TABLET | Freq: Once | ORAL | Status: AC
  Administered 2018-11-21: 22:00:00 650 mg via ORAL
  Filled 2018-11-21: qty 2

## 2018-11-21 MED ORDER — SODIUM CHLORIDE 0.9 % IV BOLUS
500.0000 mL | Freq: Once | INTRAVENOUS | Status: AC
  Administered 2018-11-21: 500 mL via INTRAVENOUS

## 2018-11-21 MED ORDER — POTASSIUM CHLORIDE 20 MEQ/15ML (10%) PO SOLN
40.0000 meq | Freq: Once | ORAL | Status: AC
  Administered 2018-11-21: 20:00:00 40 meq via ORAL
  Filled 2018-11-21: qty 30

## 2018-11-21 MED ORDER — MORPHINE SULFATE (PF) 4 MG/ML IV SOLN
4.0000 mg | Freq: Once | INTRAVENOUS | Status: AC
  Administered 2018-11-21: 4 mg via INTRAVENOUS
  Filled 2018-11-21: qty 1

## 2018-11-21 MED ORDER — ONDANSETRON HCL 4 MG/2ML IJ SOLN
4.0000 mg | Freq: Once | INTRAMUSCULAR | Status: AC
  Administered 2018-11-21: 4 mg via INTRAVENOUS
  Filled 2018-11-21: qty 2

## 2018-11-21 NOTE — ED Provider Notes (Signed)
[place]    CSN: [id number] Arrival date & time: 11/21/18  1411      History   Chief Complaint Chief Complaint  Patient presents with  . Fever  . Fatigue  . Emesis    HPI [name] is a 63 y.o. female history of chronic A. fib on Eliquis, COPD, recent sepsis hospital admission presenting today for evaluation of weakness, chills and fever.  Patient states that beginning over the past 2 days she has developed chills and fevers.  She has felt more weak.  Prior to the onset of fevers she notes that she felt confused.  She vomited approximately 6-7 times on Saturday.  She denies any cough, congestion or sore throat.  Denies abdominal pain.  Has urinary frequency, but takes Lasix.  Denies dysuria.  Patient recently admitted 5/27 to 5/31 for sepsis due to E. coli and positive blood cultures.  She continued on antibiotics at home.  Has been off of these for few weeks.  HPI  Past Medical History:  Diagnosis Date  . Abnormal Pap smear    years ago/no biopsy  . Allergic rhinitis   . Anemia, iron deficiency    iron infusion scheduled for 04/02/2018  . Aortic atherosclerosis ([place])   . Arthritis    back- lower  . Asthma   . Atrial fibrillation ([place]) August 2012   OFF XARELTO LAST MONTH DUE TO BLEEDING IN STOOL  . Bursitis of hip left  . Chronic diastolic (congestive) heart failure (HCC)    pt unaware of this  . COPD (chronic obstructive pulmonary disease) with chronic bronchitis ([place])   . Decreased pulses in feet   . Dysrhythmia    afib, followed by Dr. Stanford Breed   . Edema of both legs   . Esophagitis 02/02/2014   Distal, linear erosions, noted on endoscopy  . Fatty liver 01/07/2012  . Fibroid 1974   fibroid cyst on left fallopian tube  . Fibromyalgia   . Foot ulcer ([place])    AREA HEALED RIGHT FOOT  . GERD (gastroesophageal reflux disease)   . Headache(784.0)    occasional sinus headache   . Hepatomegaly   . History of blood transfusion JULY 2015  . History  of cardiomegaly 12/06/2010   Noted on CT  . History of E. coli septicemia   . Hypertension   . Hypoparathyroidism ([place]) 01/07/2011  . Hypothyroidism   . Kidney stone 10-18-2015   passed on their own  . Morbid obesity ([place])   . MRSA infection   . Nephrolithiasis 2/16, 9/16   SEES DR Risa Grill  . Pernicious anemia 02/20/2014   followed by Debbrah Alar  . Pneumonia   . PONV (postoperative nausea and vomiting)   . Rash    thighs and back  . Seizures ([place])    infancy secondary to fever  . Staph aureus infection   . Thoracic spondylosis 12/06/2010   Noted on CT  . Thyroid cancer ([place]) 2011   THYROIDECTOMY DONE  . Uterine cancer ([place]) 2014   Mirena IUD  . Vitamin B 12 deficiency   . Vitamin D deficiency     Patient Active Problem List   Diagnosis Date Noted  . Sepsis due to Escherichia coli (E. coli) ([place]) 11/28/2017  . E coli bacteremia 11/28/2017  . Sepsis ([place]) 11/24/2017  . Hypokalemia 12/10/2016  . Hypocalcemia 12/10/2016  . Recurrent falls 12/09/2016  . Right ureteral stone 07/17/2014  . Osteomyelitis ([place]) 06/14/2014  . Pernicious anemia 02/20/2014  .  Reflux esophagitis 02/02/2014  . Edema 11/23/2013  . Foot pain 03/31/2013  . Endometrial adenocarcinoma ([place]) 02/21/2013  . Trochanteric bursitis of left hip 09/13/2012  . Muscle cramps 08/12/2012  . Fatty liver 01/07/2012  . Mid back pain 09/30/2011  . Weight gain 07/02/2011  . Nonspecific abnormal unspecified cardiovascular function study 04/30/2011  . Hx of papillary thyroid carcinoma 04/07/2011  . Low back pain 04/01/2011  . Atrial fibrillation ([place]) 02/05/2011  . Hypoparathyroidism ([place]) 01/07/2011  . GERD 05/22/2009  . Leukocytosis 03/24/2009  . OBESITY, MORBID 12/20/2008  . RHINOSINUSITIS, RECURRENT 09/07/2008  . Vitamin D deficiency 05/10/2007  . Iron deficiency anemia secondary to malabsorption  05/10/2007  . Hypothyroidism 01/05/2007  . Essential hypertension 01/05/2007  . ALLERGIC RHINITIS  01/05/2007  . Asthma 01/05/2007    Past Surgical History:  Procedure Laterality Date  . AMPUTATION Right 05/18/2014   Procedure: RIGHT FIFTH RAY AMPUTATION FOOT;  Surgeon: Wylene Simmer, MD;  Location: [facility];  Service: Orthopedics;  Laterality: Right;  . APPENDECTOMY    . BUNIONECTOMY     bilateral  . COLONOSCOPY WITH PROPOFOL N/A 02/02/2014   Procedure: COLONOSCOPY WITH PROPOFOL;  Surgeon: Irene Shipper, MD;  Location: [facility];  Service: Endoscopy;  Laterality: N/A;  . cyst on ovary removed     . CYSTOSCOPY W/ RETROGRADES  03/03/2012   Procedure: CYSTOSCOPY WITH RETROGRADE PYELOGRAM;  Surgeon: Bernestine Amass, MD;  Location: [facility];  Service: Urology;  Laterality: Bilateral;  . CYSTOSCOPY W/ URETERAL STENT PLACEMENT Right 11/25/2017   Procedure: CYSTOSCOPY WITH RETROGRADE RIGHT URETERAL STENT PLACEMENT;  Surgeon: Franchot Gallo, MD;  Location: [facility];  Service: Urology;  Laterality: Right;  . CYSTOSCOPY W/ URETERAL STENT PLACEMENT Right 01/15/2018   Procedure: RIGHT STENT EXCHANGE;  Surgeon: Ardis Hughs, MD;  [facility];  Service: Urology;  Laterality: Right;  . CYSTOSCOPY WITH RETROGRADE PYELOGRAM, URETEROSCOPY AND STENT PLACEMENT Left 08/25/2012   Procedure: CYSTOSCOPY WITH RETROGRADE PYELOGRAM, URETEROSCOPY;  Surgeon: Bernestine Amass, MD;  [facility];  Service: Urology;  Laterality: Left;  . CYSTOSCOPY WITH STENT PLACEMENT Left 01/15/2018   Procedure: LEFT STENT PLACEMENT;  Surgeon: Ardis Hughs, MD;  [facility];  Service: Urology;  Laterality: Left;  . CYSTOSCOPY WITH STENT PLACEMENT Right 01/22/2018   Procedure: RIGHT STENT REMOVAL;  Surgeon: Ardis Hughs, MD;  [facility];  Service: Urology;  Laterality: Right;  . CYSTOSCOPY/URETEROSCOPY/HOLMIUM LASER/STENT PLACEMENT Right 12/23/2017   Procedure: RIGHT URETEROSCOPY STONE REMOVAL, HOLMIUM LASER, RIGHT /STENT EXCHANGE;  Surgeon: Ardis Hughs, MD;  [facility];  Service: Urology;   Laterality: Right;  . CYSTOSCOPY/URETEROSCOPY/HOLMIUM LASER/STENT PLACEMENT Left 01/22/2018   Procedure: LEFT URETEROSCOPY STONE REMOVAL HOLMIUM LASER LEFT STENT Freddi Starr;  Surgeon: Ardis Hughs, MD;  [facility];  Service: Urology;  Laterality: Left;  . DILATION AND CURETTAGE OF UTERUS N/A 02/21/2013   Procedure: DILATATION AND CURETTAGE;  Surgeon: Lyman Speller, MD;  Location: [facility];  Service: Gynecology;  Laterality: N/A;  . DILATION AND CURETTAGE OF UTERUS N/A 04/09/2015   Procedure: [place] IUD removal;  Surgeon: Megan Salon, MD;  Location: [facility];  Service: Gynecology;  Laterality: N/A;  Patient weight 307lbs  . ESOPHAGOGASTRODUODENOSCOPY (EGD) WITH PROPOFOL N/A 02/02/2014   Procedure: ESOPHAGOGASTRODUODENOSCOPY (EGD) WITH PROPOFOL;  Surgeon: Irene Shipper, MD;  [facility];  Service: Endoscopy;  Laterality: N/A;  . GASTRIC BYPASS  1974  . HOLMIUM LASER APPLICATION Left 08/31/2705  Procedure: HOLMIUM LASER APPLICATION;  Surgeon: Bernestine Amass, MD;  [facility];  Service: Urology;  Laterality: Left;  . LITHOTRIPSY  03/2012  . LITHOTRIPSY  2/16  . THYROIDECTOMY  05/15/2010  . TONSILLECTOMY AND ADENOIDECTOMY    . URETEROSCOPY  03/03/2012   Procedure: URETEROSCOPY;  Surgeon: Bernestine Amass, MD;  [facility];  Service: Urology;  Laterality: Left;  . URETEROSCOPY WITH HOLMIUM LASER LITHOTRIPSY Bilateral 01/15/2018   Procedure: CYSTOSCOPY/BILATERAL URETEROSCOPY WITH HOLMIUM LASER LITHOTRIPSY STONE REMOVAL;  Surgeon: Ardis Hughs, MD;  [facility];  Service: Urology;  Laterality: Bilateral;    OB History    Gravida  0   Para  0   Term  0   Preterm  0   AB  0   Living  0     SAB  0   TAB  0   Ectopic  0   Multiple  0   Live Births               Home Medications    Prior to Admission medications   Medication Sig Start Date End Date Taking? Authorizing Provider  acetaminophen  (TYLENOL) 325 MG tablet Take 2 tablets (650 mg total) by mouth every 6 (six) hours as needed for mild pain or moderate pain. 12/08/16   Waynetta Pean, PA-C  albuterol (PROVENTIL HFA;VENTOLIN HFA) 108 (90 Base) MCG/ACT inhaler Inhale 2 puffs into the lungs every 6 (six) hours as needed for wheezing or shortness of breath. 11/10/16   Debbrah Alar, NP  albuterol (PROVENTIL) (2.5 MG/3ML) 0.083% nebulizer solution Take 3 mLs (2.5 mg total) by nebulization every 6 (six) hours as needed for wheezing or shortness of breath. 01/29/18   Debbrah Alar, NP  calcitRIOL (ROCALTROL) 0.5 MCG capsule Take 2 mcg by mouth daily.     [provider]  calcium elemental as carbonate (TUMS ULTRA 1000) 400 MG chewable tablet Chew 2,000 mg by mouth 2 (two) times daily.    [provider]  cyanocobalamin (,VITAMIN B-12,) 1000 MCG/ML injection Inject 1 mL (1,000 mcg total) into the skin every 30 (thirty) days. 08/04/18   Debbrah Alar, NP  desoximetasone (TOPICORT) 0.05 % cream Apply topically 2 (two) times daily as needed. Patient taking differently: Apply 1 application topically 2 (two) times daily as needed (for rash).  01/29/17   Debbrah Alar, NP  diclofenac sodium (VOLTAREN) 1 % GEL Apply 2 g topically 4 (four) times daily. 05/05/18   Debbrah Alar, NP  diltiazem (CARDIZEM CD) 240 MG 24 hr capsule TAKE 1 CAPSULE BY MOUTH  DAILY Patient taking differently: Take 240 mg by mouth daily.  08/18/18   Debbrah Alar, NP  diphenhydrAMINE (BENADRYL) 25 mg capsule Take 25 mg by mouth every 6 (six) hours as needed for itching.     [provider]  ELIQUIS 5 MG TABS tablet TAKE 1 TABLET BY MOUTH TWO  TIMES DAILY Patient taking differently: Take 5 mg by mouth 2 (two) times daily.  09/16/18   Lelon Perla, MD  EPINEPHrine (EPIPEN 2-PAK) 0.3 mg/0.3 mL IJ SOAJ injection Inject 0.3 mg into the muscle once.    [provider]  furosemide (LASIX) 40 MG tablet TAKE 2  TABLETS BY MOUTH  DAILY IN THE MORNING AND 1  TABLET IN THE EVENING Patient taking differently: Take 40-80 mg by mouth See admin instructions. TAKE 2 TABLETS BY MOUTH  DAILY IN THE MORNING AND 1  TABLET IN THE EVENING 08/18/18  Debbrah Alar, NP  hydrochlorothiazide (HYDRODIURIL) 25 MG tablet TAKE 1 TABLET(25 MG) BY MOUTH DAILY IN THE MORNING Patient taking differently: Take 25 mg by mouth daily with breakfast.  08/13/18   Debbrah Alar, NP  hydrocortisone cream 1 % Apply topically 4 (four) times daily as needed for itching. 10/24/18   Cherylann Ratel A, DO  levocetirizine (XYZAL) 5 MG tablet TAKE 1 TABLET BY MOUTH  EVERY EVENING Patient taking differently: Take 5 mg by mouth every evening.  08/18/18   Debbrah Alar, NP  levothyroxine (SYNTHROID, LEVOTHROID) 100 MCG tablet Take 400 mcg by mouth daily before breakfast.     [provider]  Melatonin 5 MG TABS Take 5 mg by mouth at bedtime as needed (for sleep).     [provider]  montelukast (SINGULAIR) 10 MG tablet Take 1 tablet (10 mg total) by mouth at bedtime. 05/05/18   Debbrah Alar, NP  mupirocin ointment (BACTROBAN) 2 % Place 1 application into the nose 2 (two) times daily. 10/24/18   Cherylann Ratel A, DO  nystatin (MYCOSTATIN) 100000 UNIT/ML suspension TAKE 5 ML BY MOUTH FOUR TIMES DAILY Patient taking differently: Use as directed 5 mLs in the mouth or throat 4 (four) times daily.  08/24/18   Debbrah Alar, NP  nystatin cream (MYCOSTATIN) Apply 1 application topically 2 (two) times daily. 05/05/18   Debbrah Alar, NP  omeprazole (PRILOSEC) 40 MG capsule TAKE 1 CAPSULE BY MOUTH  DAILY 08/18/18   Debbrah Alar, NP  ondansetron (ZOFRAN) 4 MG tablet Take 1 tablet (4 mg total) by mouth every 8 (eight) hours as needed for nausea or vomiting. Patient not taking: Reported on 10/21/2018 12/30/17   Debbrah Alar, NP  potassium chloride 20 MEQ/15ML (10%) SOLN Take 15 mLs (20 mEq total) by mouth 2  (two) times daily. 10/12/18   Debbrah Alar, NP  SYMBICORT 160-4.5 MCG/ACT inhaler INHALE 2 PUFFS BY MOUTH TWO TIMES DAILY Patient taking differently: Inhale 2 puffs into the lungs 2 (two) times a day.  06/17/18   Debbrah Alar, NP  Syringe/Needle, Disp, (SYRINGE 3CC/20GX1") 20G X 1" 3 ML MISC Use monthly as directed 08/04/18   Debbrah Alar, NP  traMADol (ULTRAM) 50 MG tablet TAKE 1 TABLET(50 MG) BY MOUTH EVERY 12 HOURS AS NEEDED Patient taking differently: Take 50 mg by mouth every 12 (twelve) hours as needed for moderate pain.  10/13/18   Trula Slade, DPM  Vitamin D, Ergocalciferol, (DRISDOL) 1.25 MG (50000 UT) CAPS capsule Take 1 capsule (50,000 Units total) by mouth every Monday, Wednesday, and Friday. 05/31/18   Debbrah Alar, NP  zafirlukast (ACCOLATE) 20 MG tablet TAKE 1 TABLET BY MOUTH TWO  TIMES DAILY BEFORE MEALS Patient taking differently: Take 20 mg by mouth 2 (two) times daily before a meal.  08/18/18   Debbrah Alar, NP    Family History Family History  Problem Relation Age of Onset  . Hypertension Father   . Diabetes Father   . Lung cancer Father   . Heart attack Father        MI at age 25  . Hypertension Mother   . Hyperthyroidism Mother   . Heart disease Mother   . Heart attack Mother        MI at age 37  . Asthma Brother   . Hypertension Brother        younger  . Heart disease Brother        older  . Colon cancer Neg Hx   . Esophageal  cancer Neg Hx   . Stomach cancer Neg Hx   . Kidney disease Neg Hx   . Liver disease Neg Hx     Social History Social History   Tobacco Use  . Smoking status: Former Smoker    Packs/day: 0.50    Years: 25.00    Pack years: 12.50    Types: Cigarettes    Start date: 12/24/1970    Quit date: 05/27/1995    Years since quitting: 23.5  . Smokeless tobacco: Never Used  . Tobacco comment: quit smoking 19 years ago  Substance Use Topics  . Alcohol use: Yes    Alcohol/week: 0.0 standard drinks     Comment: 1/2 glass per month  . Drug use: No     Allergies   Other, Rocephin [ceftriaxone], Amoxicillin-pot clavulanate, Ciprofloxacin, Food, Keflex [cephalexin], Penicillins, and Tomato   Review of Systems Review of Systems  Constitutional: Positive for appetite change, chills, fatigue and fever. Negative for activity change.  HENT: Negative for congestion, ear pain, rhinorrhea, sinus pressure, sore throat and trouble swallowing.   Eyes: Negative for discharge and redness.  Respiratory: Negative for cough, chest tightness and shortness of breath.   Cardiovascular: Negative for chest pain.  Gastrointestinal: Positive for nausea and vomiting. Negative for abdominal pain and diarrhea.  Musculoskeletal: Negative for myalgias.  Skin: Negative for rash.  Neurological: Negative for dizziness, light-headedness and headaches.     Physical Exam Triage Vital Signs ED Triage Vitals  Enc Vitals Group     BP 11/21/18 1504 (!) 148/73     Pulse Rate 11/21/18 1504 (!) 115     Resp 11/21/18 1504 20     Temp 11/21/18 1504 99.6 F (37.6 C)     Temp Source 11/21/18 1504 Oral     SpO2 11/21/18 1504 97 %     Weight --      Height --      Head Circumference --      Peak Flow --      Pain Score 11/21/18 1505 6     Pain Loc --      Pain Edu? --      Excl. in [place]? --    No data found.  Updated Vital Signs BP (!) 148/73 (BP Location: Right Arm)   Pulse (!) 115   Temp 99.6 F (37.6 C) (Oral)   Resp 20   LMP 06/11/2012   SpO2 97%   Visual Acuity Right Eye Distance:   Left Eye Distance:   Bilateral Distance:    Right Eye Near:   Left Eye Near:    Bilateral Near:     Physical Exam Vitals signs and nursing note reviewed.  Constitutional:      General: She is not in acute distress.    Appearance: She is well-developed.     Comments: No acute distress  HENT:     Head: Normocephalic and atraumatic.     Nose: Nose normal.  Eyes:     Conjunctiva/sclera: Conjunctivae normal.  Neck:      Musculoskeletal: Neck supple.  Cardiovascular:     Rate and Rhythm: Tachycardia present.  Pulmonary:     Effort: Pulmonary effort is normal. No respiratory distress.     Comments: Breathing comfortably at rest, CTABL, no wheezing, rales or other adventitious sounds auscultated Abdominal:     General: There is no distension.  Musculoskeletal: Normal range of motion.     Comments: Bilateral lower extremity edema  Skin:    General:  Skin is warm and dry.  Neurological:     Mental Status: She is alert and oriented to person, place, and time.      UC Treatments / Results  Labs (all labs ordered are listed, but only abnormal results are displayed) Labs Reviewed - No data to display  EKG None  Radiology No results found.  Procedures Procedures (including critical care time)  Medications Ordered in UC Medications - No data to display  Initial Impression / Assessment and Plan / UC Course  I have reviewed the triage vital signs and the nursing notes.  Pertinent labs & imaging results that were available during my care of the patient were reviewed by me and considered in my medical decision making (see chart for details).     Given recent hospital admission for sepsis secondary to E. coli and recent episode of confusion with associated chills, vomiting, and presenting today with low-grade fever and tachycardic, recommending further evaluation and emergency room.  Feel patient warrants further blood work/evaluation in order to ensure safety to be discharged home which can not be provided at urgent care.  Patient stable on discharge, transported to ED with nursing staff via wheelchair. Patient verbalized understanding.   Final Clinical Impressions(s) / UC Diagnoses   Final diagnoses:  Weakness  Fever, unspecified fever cause     Discharge Instructions     Discharge to ED   ED Prescriptions    None     Controlled Substance Prescriptions [place] Controlled Substance  Registry consulted? Not Applicable   Janith L[place], [place] 11/21/18 1542

## 2018-11-21 NOTE — H&P (Signed)
History and Physical   [name]:[id number] DOB: Jul 04, 1955 DOA: 11/21/2018  Referring MD/NP/PA: Dr. Billy Fischer  PCP: Debbrah Alar, NP   Outpatient Specialists: None  Patient coming from: Home  Chief Complaint: Generalized body aches  HPI: [name] is a 63 y.o. female with medical history significant of recent E. coli bacteremia On Oct 21, 2018, A. fib on chronic anticoagulation, COPD,, Morbid obesity, history of thyroid cancer status post ablation, history of kidney stones and fatty liver who was brought in for evaluation of weakness some fever and chills.  Patient has been fine since she left the hospital on May 31.  In the past 2 days however she has progressively gotten weak tired and developed some fever.  Patient also has some mild confusion.  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  She also has some nausea with 6 episodes of vomiting yesterday.  Denied any diarrhea denied any abdominal pain.  Patient noted strong smell of her urine and decreased volume.  No significant pain.  She came to the ER where she is evaluated and found to have another episodes of urinary tract infection.  She did have left back pain which turned out to be CVA tenderness.  At this point she is being readmitted with acute pyelonephritis..  ED Course: Temperature is 100.1 with blood pressure 93/52, pulse 115 respirate of 22 and oxygen sat 95% on room air.  Sodium 134 potassium 2.4 chloride 91 CO2 26 with glucose 117.  BUN 25 creatinine 1.02 calcium 8.5.  Gap of 17.  Magnesium 1.9.  BNP 56.  White count is 18.3 and hemoglobin 10.8.  Platelets 186.  Lactic acid of 2.7.  Urinalysis is positive for nitrite and large leukocytes.  WBC more than 50.  CT abdomen pelvis showed Abnormal findings of the left kidney probably reflecting pyelonephritis and bilateral nonobstructing nephrolithiasis.  Patient has received fosfomycin dose in the ER due to allergies.   She is being admitted for treatment.  Review of Systems: As per HPI otherwise 10 point review of systems negative.    Past Medical History:  Diagnosis Date   Abnormal Pap smear    years ago/no biopsy   Allergic rhinitis    Anemia, iron deficiency    iron infusion scheduled for 04/02/2018   Aortic atherosclerosis (HCC)    Arthritis    back- lower   Asthma    Atrial fibrillation ([place]) August 2012   OFF XARELTO LAST MONTH DUE TO BLEEDING IN STOOL   Bursitis of hip left   Chronic diastolic (congestive) heart failure (HCC)    pt unaware of this   COPD (chronic obstructive pulmonary disease) with chronic bronchitis (HCC)    Decreased pulses in feet    Dysrhythmia    afib, followed by Dr. Stanford Breed    Edema of both legs    Esophagitis 02/02/2014   Distal, linear erosions, noted on endoscopy   Fatty liver 01/07/2012   Fibroid 1974   fibroid cyst on left fallopian tube   Fibromyalgia    Foot ulcer ([place])    AREA HEALED RIGHT FOOT   GERD (gastroesophageal reflux disease)    Headache(784.0)    occasional sinus headache    Hepatomegaly    History of blood transfusion JULY 2015   History of cardiomegaly 12/06/2010   Noted on CT   History of E. coli septicemia    Hypertension    Hypoparathyroidism ([place]) 01/07/2011  Hypothyroidism    Kidney stone 09/21/15   passed on their own   Morbid obesity ([place])    MRSA infection    Nephrolithiasis 2/16, 9/16   SEES DR GRAPEY   Pernicious anemia 02/20/2014   followed by Debbrah Alar   Pneumonia    PONV (postoperative nausea and vomiting)    Rash    thighs and back   Seizures ([place])    infancy secondary to fever   Staph aureus infection    Thoracic spondylosis 12/06/2010   Noted on CT   Thyroid cancer ([place]) 2011   THYROIDECTOMY DONE   Uterine cancer ([place]) 2014   Mirena IUD   Vitamin B 12 deficiency    Vitamin D deficiency     Past Surgical History:  Procedure Laterality Date    AMPUTATION Right 05/18/2014   Procedure: RIGHT FIFTH RAY AMPUTATION FOOT;  Surgeon: Wylene Simmer, MD;  Location: [facility];  Service: Orthopedics;  Laterality: Right;   APPENDECTOMY     BUNIONECTOMY     bilateral   COLONOSCOPY WITH PROPOFOL N/A 02/02/2014   Procedure: COLONOSCOPY WITH PROPOFOL;  Surgeon: Irene Shipper, MD;  [facility];  Service: Endoscopy;  Laterality: N/A;   cyst on ovary removed      CYSTOSCOPY W/ RETROGRADES  03/03/2012   Procedure: CYSTOSCOPY WITH RETROGRADE PYELOGRAM;  Surgeon: Bernestine Amass, MD;  [facility];  Service: Urology;  Laterality: Bilateral;   CYSTOSCOPY W/ URETERAL STENT PLACEMENT Right 11/25/2017   Procedure: CYSTOSCOPY WITH RETROGRADE RIGHT URETERAL STENT PLACEMENT;  Surgeon: Franchot Gallo, MD;  Location: [facility];  Service: Urology;  Laterality: Right;   CYSTOSCOPY W/ URETERAL STENT PLACEMENT Right 01/15/2018   Procedure: RIGHT STENT EXCHANGE;  Surgeon: Ardis Hughs, MD;  [facility];  Service: Urology;  Laterality: Right;   CYSTOSCOPY WITH RETROGRADE PYELOGRAM, URETEROSCOPY AND STENT PLACEMENT Left 08/25/2012   Procedure: CYSTOSCOPY WITH RETROGRADE PYELOGRAM, URETEROSCOPY;  Surgeon: Bernestine Amass, MD;  [facility];  Service: Urology;  Laterality: Left;   CYSTOSCOPY WITH STENT PLACEMENT Left 01/15/2018   Procedure: LEFT STENT PLACEMENT;  Surgeon: Ardis Hughs, MD;  [facility];  Service: Urology;  Laterality: Left;   CYSTOSCOPY WITH STENT PLACEMENT Right 01/22/2018   Procedure: RIGHT STENT REMOVAL;  Surgeon: Ardis Hughs, MD;  [facility];  Service: Urology;  Laterality: Right;   CYSTOSCOPY/URETEROSCOPY/HOLMIUM LASER/STENT PLACEMENT Right 12/23/2017   Procedure: RIGHT URETEROSCOPY STONE REMOVAL, HOLMIUM LASER, RIGHT /STENT EXCHANGE;  Surgeon: Ardis Hughs, MD;  [facility];  Service: Urology;  Laterality: Right;   CYSTOSCOPY/URETEROSCOPY/HOLMIUM LASER/STENT PLACEMENT Left 01/22/2018    Procedure: LEFT URETEROSCOPY STONE REMOVAL HOLMIUM LASER LEFT STENT Freddi Starr;  Surgeon: Ardis Hughs, MD;  [facility];  Service: Urology;  Laterality: Left;   DILATION AND CURETTAGE OF UTERUS N/A 02/21/2013   Procedure: DILATATION AND CURETTAGE;  Surgeon: Lyman Speller, MD;  Location: [facility];  Service: Gynecology;  Laterality: N/A;   DILATION AND CURETTAGE OF UTERUS N/A 04/09/2015   Procedure: [place] IUD removal;  Surgeon: Megan Salon, MD;  Location: [facility];  Service: Gynecology;  Laterality: N/A;  Patient weight 307lbs   ESOPHAGOGASTRODUODENOSCOPY (EGD) WITH PROPOFOL N/A 02/02/2014   Procedure: ESOPHAGOGASTRODUODENOSCOPY (EGD) WITH PROPOFOL;  Surgeon: Irene Shipper, MD;  [facility];  Service: Endoscopy;  Laterality: N/A;   GASTRIC BYPASS  1974   HOLMIUM LASER APPLICATION Left 12/25/4233   Procedure: HOLMIUM LASER APPLICATION;  Surgeon: Shanon Brow  Cy Blamer, MD;  [facility];  Service: Urology;  Laterality: Left;   LITHOTRIPSY  03/2012   LITHOTRIPSY  2/16   THYROIDECTOMY  05/15/2010   TONSILLECTOMY AND ADENOIDECTOMY     URETEROSCOPY  03/03/2012   Procedure: URETEROSCOPY;  Surgeon: Bernestine Amass, MD;  [facility];  Service: Urology;  Laterality: Left;   URETEROSCOPY WITH HOLMIUM LASER LITHOTRIPSY Bilateral 01/15/2018   Procedure: CYSTOSCOPY/BILATERAL URETEROSCOPY WITH HOLMIUM LASER LITHOTRIPSY STONE REMOVAL;  Surgeon: Ardis Hughs, MD;  [facility];  Service: Urology;  Laterality: Bilateral;     reports that she quit smoking about 23 years ago. Her smoking use included cigarettes. She started smoking about 47 years ago. She has a 12.50 pack-year smoking history. She has never used smokeless tobacco. She reports current alcohol use. She reports that she does not use drugs.  Allergies  Allergen Reactions   Other Anaphylaxis    Reaction to nuts   Amoxicillin-Pot Clavulanate Other (See Comments)     headache   Ciprofloxacin Nausea Only and Rash   Food Hives and Other (See Comments)    Potato   Keflex [Cephalexin] Rash   Penicillins Rash    Headache. And hives - Dose not tolerate Cephalosporin either   Has patient had a PCN reaction causing immediate rash, facial/tongue/throat swelling, SOB or lightheadedness with hypotension: No Has patient had a PCN reaction causing severe rash involving mucus membranes or skin necrosis: No Has patient had a PCN reaction that required hospitalization: No Has patient had a PCN reaction occurring within the last 10 years: Yes If all of the above answers are "NO", then may proceed with Cephalosporin [place]   Rocephin [Ceftriaxone] Rash   Tomato Hives    Family History  Problem Relation Age of Onset   Hypertension Father    Diabetes Father    Lung cancer Father    Heart attack Father        MI at age 73   Hypertension Mother    Hyperthyroidism Mother    Heart disease Mother    Heart attack Mother        MI at age 16   Asthma Brother    Hypertension Brother        younger   Heart disease Brother        older   Colon cancer Neg Hx    Esophageal cancer Neg Hx    Stomach cancer Neg Hx    Kidney disease Neg Hx    Liver disease Neg Hx      Prior to Admission medications   Medication Sig Start Date End Date Taking? Authorizing Provider  acetaminophen (TYLENOL) 325 MG tablet Take 2 tablets (650 mg total) by mouth every 6 (six) hours as needed for mild pain or moderate pain. 12/08/16   Waynetta Pean, PA-C  albuterol (PROVENTIL HFA;VENTOLIN HFA) 108 (90 Base) MCG/ACT inhaler Inhale 2 puffs into the lungs every 6 (six) hours as needed for wheezing or shortness of breath. 11/10/16   Debbrah Alar, NP  albuterol (PROVENTIL) (2.5 MG/3ML) 0.083% nebulizer solution Take 3 mLs (2.5 mg total) by nebulization every 6 (six) hours as needed for wheezing or shortness of breath. 01/29/18   Debbrah Alar, NP  calcitRIOL  (ROCALTROL) 0.5 MCG capsule Take 2 mcg by mouth daily.     [provider]  calcium elemental as carbonate (TUMS ULTRA 1000) 400 MG chewable tablet Chew 2,000 mg by mouth 2 (two) times daily.    [provider]  cyanocobalamin (,VITAMIN B-12,) 1000 MCG/ML injection Inject 1 mL (1,000 mcg total) into the skin every 30 (thirty) days. 08/04/18   Debbrah Alar, NP  desoximetasone (TOPICORT) 0.05 % cream Apply topically 2 (two) times daily as needed. Patient taking differently: Apply 1 application topically 2 (two) times daily as needed (for rash).  01/29/17   Debbrah Alar, NP  diclofenac sodium (VOLTAREN) 1 % GEL Apply 2 g topically 4 (four) times daily. 05/05/18   Debbrah Alar, NP  diltiazem (CARDIZEM CD) 240 MG 24 hr capsule TAKE 1 CAPSULE BY MOUTH  DAILY Patient taking differently: Take 240 mg by mouth daily.  08/18/18   Debbrah Alar, NP  diphenhydrAMINE (BENADRYL) 25 mg capsule Take 25 mg by mouth every 6 (six) hours as needed for itching.     [provider]  ELIQUIS 5 MG TABS tablet TAKE 1 TABLET BY MOUTH TWO  TIMES DAILY Patient taking differently: Take 5 mg by mouth 2 (two) times daily.  09/16/18   Lelon Perla, MD  EPINEPHrine (EPIPEN 2-PAK) 0.3 mg/0.3 mL IJ SOAJ injection Inject 0.3 mg into the muscle once.    [provider]  furosemide (LASIX) 40 MG tablet TAKE 2 TABLETS BY MOUTH  DAILY IN THE MORNING AND 1  TABLET IN THE EVENING Patient taking differently: Take 40-80 mg by mouth See admin instructions. Take 2 tablets (80 mg) by mouth every morning and 1 tablet (40 mg) every evening 08/18/18   Debbrah Alar, NP  hydrochlorothiazide (HYDRODIURIL) 25 MG tablet TAKE 1 TABLET(25 MG) BY MOUTH DAILY IN THE MORNING Patient taking differently: Take 25 mg by mouth daily with breakfast.  08/13/18   Debbrah Alar, NP  hydrocortisone cream 1 % Apply topically 4 (four) times daily as needed for itching. 10/24/18   Cherylann Ratel A, DO    levocetirizine (XYZAL) 5 MG tablet TAKE 1 TABLET BY MOUTH  EVERY EVENING Patient taking differently: Take 5 mg by mouth every evening.  08/18/18   Debbrah Alar, NP  levothyroxine (SYNTHROID, LEVOTHROID) 100 MCG tablet Take 350 mcg by mouth daily before breakfast.     [provider]  Melatonin 5 MG TABS Take 5 mg by mouth at bedtime as needed (for sleep).     [provider]  montelukast (SINGULAIR) 10 MG tablet Take 1 tablet (10 mg total) by mouth at bedtime. 05/05/18   Debbrah Alar, NP  mupirocin ointment (BACTROBAN) 2 % Place 1 application into the nose 2 (two) times daily. 10/24/18   Cherylann Ratel A, DO  nystatin (MYCOSTATIN) 100000 UNIT/ML suspension TAKE 5 ML BY MOUTH FOUR TIMES DAILY Patient taking differently: Use as directed 5 mLs in the mouth or throat 4 (four) times daily.  08/24/18   Debbrah Alar, NP  nystatin cream (MYCOSTATIN) Apply 1 application topically 2 (two) times daily. 05/05/18   Debbrah Alar, NP  omeprazole (PRILOSEC) 40 MG capsule TAKE 1 CAPSULE BY MOUTH  DAILY Patient taking differently: Take 40 mg by mouth daily.  08/18/18   Debbrah Alar, NP  ondansetron (ZOFRAN) 4 MG tablet Take 1 tablet (4 mg total) by mouth every 8 (eight) hours as needed for nausea or vomiting. Patient not taking: Reported on 10/21/2018 12/30/17   Debbrah Alar, NP  potassium chloride 20 MEQ/15ML (10%) SOLN Take 15 mLs (20 mEq total) by mouth 2 (two) times daily. Patient taking differently: Take 20 mEq by mouth daily.  10/12/18   Debbrah Alar, NP  SYMBICORT 160-4.5 MCG/ACT inhaler INHALE 2 PUFFS BY  MOUTH TWO TIMES DAILY Patient taking differently: Inhale 2 puffs into the lungs 2 (two) times a day.  06/17/18   Debbrah Alar, NP  Syringe/Needle, Disp, (SYRINGE 3CC/20GX1") 20G X 1" 3 ML MISC Use monthly as directed 08/04/18   Debbrah Alar, NP  traMADol (ULTRAM) 50 MG tablet TAKE 1 TABLET(50 MG) BY MOUTH EVERY 12 HOURS AS NEEDED Patient  taking differently: Take 50 mg by mouth every 12 (twelve) hours as needed for moderate pain.  10/13/18   Trula Slade, DPM  Vitamin D, Ergocalciferol, (DRISDOL) 1.25 MG (50000 UT) CAPS capsule Take 1 capsule (50,000 Units total) by mouth every Monday, Wednesday, and Friday. 05/31/18   Debbrah Alar, NP  zafirlukast (ACCOLATE) 20 MG tablet TAKE 1 TABLET BY MOUTH TWO  TIMES DAILY BEFORE MEALS Patient taking differently: Take 20 mg by mouth 2 (two) times daily before a meal.  08/18/18   Debbrah Alar, NP    Physical Exam: Vitals:   11/21/18 2045 11/21/18 2100 11/21/18 2130 11/21/18 2215  BP: 122/64 132/63 (!) 120/59 123/64  Pulse: 90 91 82 90  Resp: 15 14 17 16   Temp:      SpO2: 95% 97% 98% 97%      Constitutional: Acutely ill looking otherwise fully awake and alert. Vitals:   11/21/18 2045 11/21/18 2100 11/21/18 2130 11/21/18 2215  BP: 122/64 132/63 (!) 120/59 123/64  Pulse: 90 91 82 90  Resp: 15 14 17 16   Temp:      SpO2: 95% 97% 98% 97%   Eyes: PERRL, lids and conjunctivae normal ENMT: Mucous membranes are dry. Posterior pharynx clear of any exudate or lesions.Normal dentition.  Neck: normal, supple, no masses, no thyromegaly Respiratory: clear to auscultation bilaterally, no wheezing, no crackles. Normal respiratory effort. No accessory muscle use.  Cardiovascular: Regular rate and rhythm, no murmurs / rubs / gallops. No extremity edema. 2+ pedal pulses. No carotid bruits.  Abdomen: no tenderness, no masses palpated. No hepatosplenomegaly. Bowel sounds positive.  Positive left CVA tenderness Musculoskeletal: no clubbing / cyanosis. No joint deformity upper and lower extremities. Good ROM, no contractures. Normal muscle tone.  Skin: no rashes, lesions, ulcers. No induration Neurologic: CN 2-12 grossly intact. Sensation intact, DTR normal. Strength 5/5 in all 4.  Psychiatric: Normal judgment and insight. Alert and oriented x 3. Normal mood.  Not confused at the  moment    Labs on Admission: I have personally reviewed following labs and imaging studies  CBC: Recent Labs  Lab 11/21/18 1742  WBC 18.3*  NEUTROABS 16.3*  HGB 10.8*  HCT 35.6*  MCV 87.5  PLT 716   Basic Metabolic Panel: Recent Labs  Lab 11/21/18 1742  NA 134*  K 2.4*  CL 91*  CO2 26  GLUCOSE 117*  BUN 25*  CREATININE 1.02*  CALCIUM 8.5*   GFR: CrCl cannot be calculated (Unknown ideal weight.). Liver Function Tests: Recent Labs  Lab 11/21/18 1742  AST 25  ALT 34  ALKPHOS 116  BILITOT 1.2  PROT 7.4  ALBUMIN 3.5   No results for input(s): LIPASE, AMYLASE in the last 168 hours. Recent Labs  Lab 11/21/18 1740  AMMONIA 15   Coagulation Profile: No results for input(s): INR, PROTIME in the last 168 hours. Cardiac Enzymes: No results for input(s): CKTOTAL, CKMB, CKMBINDEX, TROPONINI in the last 168 hours. BNP (last 3 results) No results for input(s): PROBNP in the last 8760 hours. HbA1C: No results for input(s): HGBA1C in the last 72 hours. CBG: No results for  input(s): GLUCAP in the last 168 hours. Lipid Profile: No results for input(s): CHOL, HDL, LDLCALC, TRIG, CHOLHDL, LDLDIRECT in the last 72 hours. Thyroid Function Tests: No results for input(s): TSH, T4TOTAL, FREET4, T3FREE, THYROIDAB in the last 72 hours. Anemia Panel: No results for input(s): VITAMINB12, FOLATE, FERRITIN, TIBC, IRON, RETICCTPCT in the last 72 hours. Urine analysis:    Component Value Date/Time   COLORURINE YELLOW 11/21/2018 2134   APPEARANCEUR HAZY (A) 11/21/2018 2134   LABSPEC 1.027 11/21/2018 2134   LABSPEC 1.010 06/09/2007 1500   PHURINE 6.0 11/21/2018 2134   GLUCOSEU NEGATIVE 11/21/2018 2134   GLUCOSEU NEGATIVE 02/16/2018 1202   HGBUR SMALL (A) 11/21/2018 2134   HGBUR negative 05/09/2010 1026   BILIRUBINUR NEGATIVE 11/21/2018 2134   BILIRUBINUR N 05/01/2016 1104   BILIRUBINUR Negative 06/09/2007 1500   KETONESUR NEGATIVE 11/21/2018 2134   PROTEINUR NEGATIVE  11/21/2018 2134   UROBILINOGEN 0.2 02/16/2018 1202   NITRITE POSITIVE (A) 11/21/2018 2134   LEUKOCYTESUR LARGE (A) 11/21/2018 2134   LEUKOCYTESUR Negative 06/09/2007 1500   Sepsis Labs: @LABRCNTIP (procalcitonin:4,lacticidven:4) ) Recent Results (from the past 240 hour(s))  SARS Coronavirus 2 (CEPHEID- Performed in [place] hospital lab), Hosp Order     Status: None   Collection Time: 11/21/18  5:18 PM   Specimen: Nasopharyngeal Swab  Result Value Ref Range Status   SARS Coronavirus 2 NEGATIVE NEGATIVE Final    Comment: (NOTE) If result is NEGATIVE SARS-CoV-2 target nucleic acids are NOT DETECTED. The SARS-CoV-2 RNA is generally detectable in upper and lower  respiratory specimens during the acute phase of infection. The lowest  concentration of SARS-CoV-2 viral copies this assay can detect is 250  copies / mL. A negative result does not preclude SARS-CoV-2 infection  and should not be used as the sole basis for treatment or other  patient management decisions.  A negative result may occur with  improper specimen collection / handling, submission of specimen other  than nasopharyngeal swab, presence of viral mutation(s) within the  areas targeted by this assay, and inadequate number of viral copies  (<250 copies / mL). A negative result must be combined with clinical  observations, patient history, and epidemiological information. If result is POSITIVE SARS-CoV-2 target nucleic acids are DETECTED. The SARS-CoV-2 RNA is generally detectable in upper and lower  respiratory specimens dur ing the acute phase of infection.  Positive  results are indicative of active infection with SARS-CoV-2.  Clinical  correlation with patient history and other diagnostic information is  necessary to determine patient infection status.  Positive results do  not rule out bacterial infection or co-infection with other viruses. If result is PRESUMPTIVE POSTIVE SARS-CoV-2 nucleic acids MAY BE  PRESENT.   A presumptive positive result was obtained on the submitted specimen  and confirmed on repeat testing.  While 2019 novel coronavirus  (SARS-CoV-2) nucleic acids may be present in the submitted sample  additional confirmatory testing may be necessary for epidemiological  and / or clinical management purposes  to differentiate between  SARS-CoV-2 and other Sarbecovirus currently known to infect humans.  If clinically indicated additional testing with an alternate test  methodology [phone]) is advised. The SARS-CoV-2 RNA is generally  detectable in upper and lower respiratory sp ecimens during the acute  phase of infection. The expected result is Negative. Fact Sheet for Patients:  StrictlyIdeas.no Fact Sheet for Healthcare Providers: BankingDealers.co.za This test is not yet approved or cleared by the [place] FDA and has been authorized for detection and/or  diagnosis of SARS-CoV-2 by FDA under an Emergency Use Authorization (EUA).  This EUA will remain in effect (meaning this test can be used) for the duration of the COVID-19 declaration under Section 564(b)(1) of the Act, 21 U.S.C. section 360bbb-3(b)(1), unless the authorization is terminated or revoked sooner. Performed at [facility], [place] [address] [place], [place] [address]      Radiological Exams on Admission: Ct Head Wo Contrast  Result Date: 11/21/2018 CLINICAL DATA:  Confusion, fatigue, vomiting EXAM: CT HEAD WITHOUT CONTRAST TECHNIQUE: Contiguous axial images were obtained from the base of the skull through the vertex without intravenous contrast. COMPARISON:  12/09/2016 FINDINGS: Brain: No evidence of acute infarction, hemorrhage, hydrocephalus, extra-axial collection or mass lesion/mass effect. Mild periventricular white matter hypodensity. Vascular: No hyperdense vessel or unexpected calcification. Skull: Hyperostosis frontalis. Negative for fracture or  focal lesion. Sinuses/Orbits: No acute finding. Other: None. IMPRESSION: No acute intracranial pathology. Mild small-vessel white matter disease. Electronically Signed   By: Eddie Candle M.D.   On: 11/21/2018 17:04   Ct Abdomen Pelvis W Contrast  Result Date: 11/21/2018 CLINICAL DATA:  63 y/o F; abdominal pain, back pain with nausea and vomiting, symptoms for 2 days. EXAM: CT ABDOMEN AND PELVIS WITH CONTRAST TECHNIQUE: Multidetector CT imaging of the abdomen and pelvis was performed using the standard protocol following bolus administration of intravenous contrast. CONTRAST:  160mL OMNIPAQUE IOHEXOL 300 MG/ML  SOLN COMPARISON:  10/20/2018 CT abdomen and pelvis. FINDINGS: Lower chest: No acute abnormality. Hepatobiliary: Hepatic steatosis. No focal liver abnormality is seen. No gallstones, gallbladder wall thickening, or biliary dilatation. Pancreas: Unremarkable. No pancreatic ductal dilatation or surrounding inflammatory changes. Spleen: Normal in size without focal abnormality. Adrenals/Urinary Tract: Adrenal glands are unremarkable. Bilateral nonobstructive nephrolithiasis. No hydronephrosis. Mild hypoenhancement of left kidney in comparison with the right kidney. Normal appearance of the bladder. Stomach/Bowel: Stomach is within normal limits. Stable prominent appendix stump without surrounding inflammatory changes. No evidence of bowel wall thickening, distention, or inflammatory changes. Diverticulosis, no findings of acute diverticulitis. Vascular/Lymphatic: Aortic atherosclerosis. No enlarged abdominal or pelvic lymph nodes. Reproductive: Uterus and bilateral adnexa are unremarkable. IUD well seated in uterine fundus. Other: No abdominal wall hernia or abnormality. No abdominopelvic ascites. Musculoskeletal: No fracture is seen. Advanced spondylosis and mild dextrocurvature of the lumbar spine. Flowing anterior ossification of lower thoracic spine compatible with DISH. IMPRESSION: 1. Mild hypoenhancement  of left kidney in comparison with the right kidney. Findings may represent a recently passed stone or possibly infection of the renal collecting system/pyelonephritis. 2. Bilateral nonobstructive nephrolithiasis. 3. Hepatic steatosis. 4. Diverticulosis without findings of acute diverticulitis. 5. Aortic Atherosclerosis (ICD10-I70.0). Electronically Signed   By: Kristine Garbe M.D.   On: 11/21/2018 20:55   Dg Chest Port 1 View  Result Date: 11/21/2018 CLINICAL DATA:  Confusion, vomiting, fatigue EXAM: PORTABLE CHEST 1 VIEW COMPARISON:  10/20/2018 FINDINGS: Cardiomegaly. Both lungs are clear. The visualized skeletal structures are unremarkable. IMPRESSION: Cardiomegaly without acute abnormality of the lungs in AP portable projection. Electronically Signed   By: Eddie Candle M.D.   On: 11/21/2018 17:02     Assessment/Plan Principal Problem:   Pyelonephritis Active Problems:   Hypothyroidism   OBESITY, MORBID   Essential hypertension   GERD   Atrial fibrillation (HCC)   Hypokalemia     #1 acute pyelonephritis: Patient with recent bacteremia.  We will admit the patient and get urine and blood cultures.  Patient has received fosfomycin however I will initiate as active with her recent  bacteremia.  She is allergic to most conventional antibiotics.  #2 postoperative hypothyroidism: Continue home regimen of levothyroxine.  #3 GERD: Continue with PPIs.  #4 atrial fibrillation: Rate is controlled.  Continue chronic anticoagulation  #5 hypokalemia: Profound hypokalemia.  Patient has been taking liquid potassium at home.  Increase to 3 times daily and monitor.  Also IV potassium given already.  #6 morbid obesity: Counseling provided.  #7 nausea with vomiting: Most likely due to pyelonephritis.   DVT prophylaxis: Eliquis Code Status: Full code Family Communication: Discussed carefully with the patient Disposition Plan: Home Consults called: None Admission status:  Inpatient  Severity of Illness: The appropriate patient status for this patient is INPATIENT. Inpatient status is judged to be reasonable and necessary in order to provide the required intensity of service to ensure the patient's safety. The patient's presenting symptoms, physical exam findings, and initial radiographic and laboratory data in the context of their chronic comorbidities is felt to place them at high risk for further clinical deterioration. Furthermore, it is not anticipated that the patient will be medically stable for discharge from the hospital within 2 midnights of admission. The following factors support the patient status of inpatient.   " The patient's presenting symptoms include fever chills and left flank pain. " The worrisome physical exam findings include left CVA tenderness. " The initial radiographic and laboratory data are worrisome because of evidence of UTI. " The chronic co-morbidities include recent bacteremia.   * I certify that at the point of admission it is my clinical judgment that the patient will require inpatient hospital care spanning beyond 2 midnights from the point of admission due to high intensity of service, high risk for further deterioration and high frequency of surveillance required.Barbette Merino MD [facility] Pager [phone]  If 7PM-7AM, please contact night-coverage [email or web address] Password [facility]  11/21/2018, 10:57 PM

## 2018-11-21 NOTE — ED Notes (Addendum)
Nurse rounding on pt. Pt. States IV comfortable. Denies IV pain. IV site clean, dry, and intact. No swelling present.

## 2018-11-21 NOTE — ED Notes (Addendum)
Pt states "I don't trust nurse." As nurse attempts to give PO liquid K. Nurse allowed pt. To see PO potassium for pt. Confirmation. Pt to take K.

## 2018-11-21 NOTE — ED Triage Notes (Signed)
The pt came from [facility] with confusion yesterday per her brother, elevated temp abd pain lt kidney pain  Painful both feet  Vomiting and nausea yesterday

## 2018-11-21 NOTE — ED Triage Notes (Signed)
Per pt she was having some confusion on Thursday and then yesterday started vomiting. Pt says she feels very fatigue. Pt was in hospital 3 weeks ago.pT HAS LOW GRADE TEMP TODAY AT 99.6

## 2018-11-21 NOTE — ED Notes (Addendum)
Pt. States painful IV site. Second Nurse: Illene Silver to assess site. IV site patent with blood return.

## 2018-11-21 NOTE — ED Notes (Addendum)
Pt states painful BP on left arm moved to right arm. Pt. States painful BP Cuff. Moved to lower right arm. Pt. States painful BP and removed cuff. Unable to assess BP

## 2018-11-21 NOTE — ED Notes (Signed)
Unable to get blood draw. RN Skip Estimable informed

## 2018-11-21 NOTE — ED Notes (Signed)
Patient transported to CT 

## 2018-11-21 NOTE — ED Notes (Signed)
Pt not in room at this moment. Will return for lab draws

## 2018-11-21 NOTE — Discharge Instructions (Signed)
Discharge to ED

## 2018-11-21 NOTE — ED Notes (Signed)
Phlebotomy at bedside.

## 2018-11-21 NOTE — ED Notes (Addendum)
Pt. Refused PO K. PA Hammond informed.

## 2018-11-21 NOTE — ED Provider Notes (Signed)
[facility] EMERGENCY DEPARTMENT Provider Note   CSN: [id number] Arrival date & time: 11/21/18  1543    History   Chief Complaint Chief Complaint  Patient presents with   Generalized Body Aches    HPI [name] is a 63 y.o. female with a past medical history of COPD, A. fib anticoagulated with Eliquis, fatty liver, morbid obesity, thyroid cancer status post thyroidectomy, kidney stones, admitted from 5/27 to 5/31 for E. coli septicemia, who presents today for evaluation of weakness, chills, and subjective fevers.  She reports that over the past 2 days she has developed chills and fevers and has felt weaker and weaker.  She reports these fevers are subjective but she does not have a thermometer at home.  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  She reports that when she was admitted for her sepsis her primary symptom was confusion.  She reports generalized left flank pain. She denies cough, shortness of breath, or chest pain.  She reports that she vomited about 6 times yesterday. No diarrhea or abdominal pain anteriorly.  She reports that her legs are as swollen as they usually are.   She also reports being concerned that she is urinating less than usual.  She states that she took her fluid pill at 9 AM and that it usually makes her urinate for about 5 hours.  She stopped urinating at about 2 however is concerned that she has not urinated since.  Attempts to clarify that this had actually been 5 hours of urination were made, patient is still concerned she is not urinating.    On review of notes form urgent care she was tachycardic at 115.  Temp 99.6 orally.     HPI  Past Medical History:  Diagnosis Date   Abnormal Pap smear    years ago/no biopsy   Allergic rhinitis    Anemia, iron deficiency    iron  infusion scheduled for 04/02/2018   Aortic atherosclerosis (HCC)    Arthritis    back- lower   Asthma    Atrial fibrillation ([place]) August 2012   OFF XARELTO LAST MONTH DUE TO BLEEDING IN STOOL   Bursitis of hip left   Chronic diastolic (congestive) heart failure (HCC)    pt unaware of this   COPD (chronic obstructive pulmonary disease) with chronic bronchitis (HCC)    Decreased pulses in feet    Dysrhythmia    afib, followed by Dr. [place] Breed    Edema of both legs    Esophagitis 02/02/2014   Distal, linear erosions, noted on endoscopy   Fatty liver 01/07/2012   Fibroid 1974   fibroid cyst on left fallopian tube   Fibromyalgia    Foot ulcer ([place])    AREA HEALED RIGHT FOOT   GERD (gastroesophageal reflux disease)    Headache(784.0)    occasional sinus headache    Hepatomegaly    History of blood transfusion JULY 2015   History of cardiomegaly 12/06/2010   Noted on CT   History of E. coli septicemia    Hypertension    Hypoparathyroidism ([place]) 01/07/2011   Hypothyroidism    Kidney stone 2015-10-16   passed on their own   Morbid obesity ([place])    MRSA infection    Nephrolithiasis 2/16, 9/16   SEES DR Risa Grill  Pernicious anemia 02/20/2014   followed by Debbrah Alar   Pneumonia    PONV (postoperative nausea and vomiting)    Rash    thighs and back   Seizures ([place])    infancy secondary to fever   Staph aureus infection    Thoracic spondylosis 12/06/2010   Noted on CT   Thyroid cancer ([place]) 2011   THYROIDECTOMY DONE   Uterine cancer ([place]) 2014   Mirena IUD   Vitamin B 12 deficiency    Vitamin D deficiency     Patient Active Problem List   Diagnosis Date Noted   Pyelonephritis 11/21/2018   Sepsis due to Escherichia coli (E. coli) ([place]) 11/28/2017   E coli bacteremia 11/28/2017   Sepsis ([place]) 11/24/2017   Hypokalemia 12/10/2016   Hypocalcemia 12/10/2016   Recurrent falls 12/09/2016   Right ureteral stone 07/17/2014    Osteomyelitis ([place]) 06/14/2014   Pernicious anemia 02/20/2014   Reflux esophagitis 02/02/2014   Edema 11/23/2013   Foot pain 03/31/2013   Endometrial adenocarcinoma ([place]) 02/21/2013   Trochanteric bursitis of left hip 09/13/2012   Muscle cramps 08/12/2012   Fatty liver 01/07/2012   Mid back pain 09/30/2011   Weight gain 07/02/2011   Nonspecific abnormal unspecified cardiovascular function study 04/30/2011   Hx of papillary thyroid carcinoma 04/07/2011   Low back pain 04/01/2011   Atrial fibrillation ([place]) 02/05/2011   Hypoparathyroidism ([place]) 01/07/2011   GERD 05/22/2009   Leukocytosis 03/24/2009   OBESITY, MORBID 12/20/2008   RHINOSINUSITIS, RECURRENT 09/07/2008   Vitamin D deficiency 05/10/2007   Iron deficiency anemia secondary to malabsorption  05/10/2007   Hypothyroidism 01/05/2007   Essential hypertension 01/05/2007   ALLERGIC RHINITIS 01/05/2007   Asthma 01/05/2007    Past Surgical History:  Procedure Laterality Date   AMPUTATION Right 05/18/2014   Procedure: RIGHT FIFTH RAY AMPUTATION FOOT;  Surgeon: Wylene Simmer, MD;  Location: [facility];  Service: Orthopedics;  Laterality: Right;   APPENDECTOMY     BUNIONECTOMY     bilateral   COLONOSCOPY WITH PROPOFOL N/A 02/02/2014   Procedure: COLONOSCOPY WITH PROPOFOL;  Surgeon: Irene Shipper, MD;  [facility]: [facility];  Service: Endoscopy;  Laterality: N/A;   cyst on ovary removed      CYSTOSCOPY W/ RETROGRADES  03/03/2012   Procedure: CYSTOSCOPY WITH RETROGRADE PYELOGRAM;  Surgeon: Bernestine Amass, MD;  Location: [facility];  Service: Urology;  Laterality: Bilateral;   CYSTOSCOPY W/ URETERAL STENT PLACEMENT Right 11/25/2017   Procedure: CYSTOSCOPY WITH RETROGRADE RIGHT URETERAL STENT PLACEMENT;  Surgeon: Franchot Gallo, MD;  Location: [facility];  Service: Urology;  Laterality: Right;   CYSTOSCOPY W/ URETERAL STENT PLACEMENT Right 01/15/2018   Procedure: RIGHT STENT EXCHANGE;  Surgeon: Ardis Hughs,  MD;  [facility];  Service: Urology;  Laterality: Right;   CYSTOSCOPY WITH RETROGRADE PYELOGRAM, URETEROSCOPY AND STENT PLACEMENT Left 08/25/2012   Procedure: CYSTOSCOPY WITH RETROGRADE PYELOGRAM, URETEROSCOPY;  Surgeon: Bernestine Amass, MD;  [facility];  Service: Urology;  Laterality: Left;   CYSTOSCOPY WITH STENT PLACEMENT Left 01/15/2018   Procedure: LEFT STENT PLACEMENT;  Surgeon: Ardis Hughs, MD;  [facility];  Service: Urology;  Laterality: Left;   CYSTOSCOPY WITH STENT PLACEMENT Right 01/22/2018   Procedure: RIGHT STENT REMOVAL;  Surgeon: Ardis Hughs, MD;  [facility];  Service: Urology;  Laterality: Right;   CYSTOSCOPY/URETEROSCOPY/HOLMIUM LASER/STENT PLACEMENT Right 12/23/2017   Procedure: RIGHT URETEROSCOPY STONE REMOVAL, HOLMIUM LASER, RIGHT /STENT EXCHANGE;  Surgeon: Ardis Hughs, MD;  [facility];  Service: Urology;  Laterality: Right;   CYSTOSCOPY/URETEROSCOPY/HOLMIUM LASER/STENT PLACEMENT Left 01/22/2018   Procedure: LEFT URETEROSCOPY STONE REMOVAL HOLMIUM LASER LEFT STENT Freddi Starr;  Surgeon: Ardis Hughs, MD;  [facility];  Service: Urology;  Laterality: Left;   DILATION AND CURETTAGE OF UTERUS N/A 02/21/2013   Procedure: DILATATION AND CURETTAGE;  Surgeon: Lyman Speller, MD;  Location: [facility];  Service: Gynecology;  Laterality: N/A;   DILATION AND CURETTAGE OF UTERUS N/A 04/09/2015   Procedure: [place] IUD removal;  Surgeon: Megan Salon, MD;  Location: [facility];  Service: Gynecology;  Laterality: N/A;  Patient weight 307lbs   ESOPHAGOGASTRODUODENOSCOPY (EGD) WITH PROPOFOL N/A 02/02/2014   Procedure: ESOPHAGOGASTRODUODENOSCOPY (EGD) WITH PROPOFOL;  Surgeon: Irene Shipper, MD;  [facility];  Service: Endoscopy;  Laterality: N/A;   GASTRIC BYPASS  1974   HOLMIUM LASER APPLICATION Left 0/06/6376   Procedure: HOLMIUM LASER APPLICATION;  Surgeon: Bernestine Amass, MD;   [facility];  Service: Urology;  Laterality: Left;   LITHOTRIPSY  03/2012   LITHOTRIPSY  2/16   THYROIDECTOMY  05/15/2010   TONSILLECTOMY AND ADENOIDECTOMY     URETEROSCOPY  03/03/2012   Procedure: URETEROSCOPY;  Surgeon: Bernestine Amass, MD;  [facility];  Service: Urology;  Laterality: Left;   URETEROSCOPY WITH HOLMIUM LASER LITHOTRIPSY Bilateral 01/15/2018   Procedure: CYSTOSCOPY/BILATERAL URETEROSCOPY WITH HOLMIUM LASER LITHOTRIPSY STONE REMOVAL;  Surgeon: Ardis Hughs, MD;  [facility];  Service: Urology;  Laterality: Bilateral;     OB History    Gravida  0   Para  0   Term  0   Preterm  0   AB  0   Living  0     SAB  0   TAB  0   Ectopic  0   Multiple  0   Live Births               Home Medications    Prior to Admission medications   Medication Sig Start Date End Date Taking? Authorizing Provider  acetaminophen (TYLENOL) 325 MG tablet Take 2 tablets (650 mg total) by mouth every 6 (six) hours as needed for mild pain or moderate pain. 12/08/16   Waynetta Pean, PA-C  albuterol (PROVENTIL HFA;VENTOLIN HFA) 108 (90 Base) MCG/ACT inhaler Inhale 2 puffs into the lungs every 6 (six) hours as needed for wheezing or shortness of breath. 11/10/16   Debbrah Alar, NP  albuterol (PROVENTIL) (2.5 MG/3ML) 0.083% nebulizer solution Take 3 mLs (2.5 mg total) by nebulization every 6 (six) hours as needed for wheezing or shortness of breath. 01/29/18   Debbrah Alar, NP  calcitRIOL (ROCALTROL) 0.5 MCG capsule Take 2 mcg by mouth daily.     [provider]  calcium elemental as carbonate (TUMS ULTRA 1000) 400 MG chewable tablet Chew 2,000 mg by mouth 2 (two) times daily.    [provider]  cyanocobalamin (,VITAMIN B-12,) 1000 MCG/ML injection Inject 1 mL (1,000 mcg total) into the skin every 30 (thirty) days. 08/04/18   Debbrah Alar, NP  desoximetasone (TOPICORT) 0.05 % cream Apply topically 2 (two) times daily as  needed. Patient taking differently: Apply 1 application topically 2 (two) times daily as needed (for rash).  01/29/17   Debbrah Alar, NP  diclofenac sodium (VOLTAREN) 1 % GEL Apply 2 g topically 4 (four) times daily. 05/05/18   Debbrah Alar, NP  diltiazem (CARDIZEM CD) 240 MG 24 hr capsule TAKE  1 CAPSULE BY MOUTH  DAILY Patient taking differently: Take 240 mg by mouth daily.  08/18/18   Debbrah Alar, NP  diphenhydrAMINE (BENADRYL) 25 mg capsule Take 25 mg by mouth every 6 (six) hours as needed for itching.     [provider]  ELIQUIS 5 MG TABS tablet TAKE 1 TABLET BY MOUTH TWO  TIMES DAILY Patient taking differently: Take 5 mg by mouth 2 (two) times daily.  09/16/18   Lelon Perla, MD  EPINEPHrine (EPIPEN 2-PAK) 0.3 mg/0.3 mL IJ SOAJ injection Inject 0.3 mg into the muscle once.    [provider]  furosemide (LASIX) 40 MG tablet TAKE 2 TABLETS BY MOUTH  DAILY IN THE MORNING AND 1  TABLET IN THE EVENING Patient taking differently: Take 40-80 mg by mouth See admin instructions. Take 2 tablets (80 mg) by mouth every morning and 1 tablet (40 mg) every evening 08/18/18   Debbrah Alar, NP  hydrochlorothiazide (HYDRODIURIL) 25 MG tablet TAKE 1 TABLET(25 MG) BY MOUTH DAILY IN THE MORNING Patient taking differently: Take 25 mg by mouth daily with breakfast.  08/13/18   Debbrah Alar, NP  hydrocortisone cream 1 % Apply topically 4 (four) times daily as needed for itching. 10/24/18   Cherylann Ratel A, DO  levocetirizine (XYZAL) 5 MG tablet TAKE 1 TABLET BY MOUTH  EVERY EVENING Patient taking differently: Take 5 mg by mouth every evening.  08/18/18   Debbrah Alar, NP  levothyroxine (SYNTHROID, LEVOTHROID) 100 MCG tablet Take 350 mcg by mouth daily before breakfast.     [provider]  Melatonin 5 MG TABS Take 5 mg by mouth at bedtime as needed (for sleep).     [provider]  montelukast (SINGULAIR) 10 MG tablet Take 1 tablet (10 mg  total) by mouth at bedtime. 05/05/18   Debbrah Alar, NP  mupirocin ointment (BACTROBAN) 2 % Place 1 application into the nose 2 (two) times daily. 10/24/18   Cherylann Ratel A, DO  nystatin (MYCOSTATIN) 100000 UNIT/ML suspension TAKE 5 ML BY MOUTH FOUR TIMES DAILY Patient taking differently: Use as directed 5 mLs in the mouth or throat 4 (four) times daily.  08/24/18   Debbrah Alar, NP  nystatin cream (MYCOSTATIN) Apply 1 application topically 2 (two) times daily. 05/05/18   Debbrah Alar, NP  omeprazole (PRILOSEC) 40 MG capsule TAKE 1 CAPSULE BY MOUTH  DAILY Patient taking differently: Take 40 mg by mouth daily.  08/18/18   Debbrah Alar, NP  ondansetron (ZOFRAN) 4 MG tablet Take 1 tablet (4 mg total) by mouth every 8 (eight) hours as needed for nausea or vomiting. Patient not taking: Reported on 10/21/2018 12/30/17   Debbrah Alar, NP  potassium chloride 20 MEQ/15ML (10%) SOLN Take 15 mLs (20 mEq total) by mouth 2 (two) times daily. Patient taking differently: Take 20 mEq by mouth daily.  10/12/18   Debbrah Alar, NP  SYMBICORT 160-4.5 MCG/ACT inhaler INHALE 2 PUFFS BY MOUTH TWO TIMES DAILY Patient taking differently: Inhale 2 puffs into the lungs 2 (two) times a day.  06/17/18   Debbrah Alar, NP  Syringe/Needle, Disp, (SYRINGE 3CC/20GX1") 20G X 1" 3 ML MISC Use monthly as directed 08/04/18   Debbrah Alar, NP  traMADol (ULTRAM) 50 MG tablet TAKE 1 TABLET(50 MG) BY MOUTH EVERY 12 HOURS AS NEEDED Patient taking differently: Take 50 mg by mouth every 12 (twelve) hours as needed for moderate pain.  10/13/18   Trula Slade, DPM  Vitamin D, Ergocalciferol, (DRISDOL) 1.25 MG (  50000 UT) CAPS capsule Take 1 capsule (50,000 Units total) by mouth every Monday, Wednesday, and Friday. 05/31/18   Debbrah Alar, NP  zafirlukast (ACCOLATE) 20 MG tablet TAKE 1 TABLET BY MOUTH TWO  TIMES DAILY BEFORE MEALS Patient taking differently: Take 20 mg by mouth 2 (two)  times daily before a meal.  08/18/18   Debbrah Alar, NP    Family History Family History  Problem Relation Age of Onset   Hypertension Father    Diabetes Father    Lung cancer Father    Heart attack Father        MI at age 61   Hypertension Mother    Hyperthyroidism Mother    Heart disease Mother    Heart attack Mother        MI at age 68   Asthma Brother    Hypertension Brother        younger   Heart disease Brother        older   Colon cancer Neg Hx    Esophageal cancer Neg Hx    Stomach cancer Neg Hx    Kidney disease Neg Hx    Liver disease Neg Hx     Social History Social History   Tobacco Use   Smoking status: Former Smoker    Packs/day: 0.50    Years: 25.00    Pack years: 12.50    Types: Cigarettes    Start date: 12/24/1970    Quit date: 05/27/1995    Years since quitting: 23.5   Smokeless tobacco: Never Used   Tobacco comment: quit smoking 19 years ago  Substance Use Topics   Alcohol use: Yes    Alcohol/week: 0.0 standard drinks    Comment: 1/2 glass per month   Drug use: No     Allergies   Other, Amoxicillin-pot clavulanate, Ciprofloxacin, Food, Keflex [cephalexin], Penicillins, Rocephin [ceftriaxone], and Tomato   Review of Systems Review of Systems  Constitutional: Positive for chills and fever.  Respiratory: Negative for cough, chest tightness and shortness of breath.   Cardiovascular: Negative for chest pain and palpitations.  Gastrointestinal: Positive for nausea and vomiting. Negative for abdominal pain and diarrhea.  Genitourinary: Positive for flank pain. Negative for dysuria, frequency, pelvic pain and urgency.  Musculoskeletal: Positive for back pain.  Neurological: Positive for weakness (Generalized, body wide).  Psychiatric/Behavioral: Positive for confusion (Resolved per patient. ).  All other systems reviewed and are negative.    Physical Exam Updated Vital Signs BP 123/64    Pulse 90    Temp 99 F  (37.2 C)    Resp 16    LMP 06/11/2012    SpO2 97%   Physical Exam Vitals signs and nursing note reviewed.  Constitutional:      General: She is not in acute distress.    Appearance: She is well-developed. She is obese. She is not diaphoretic.  HENT:     Head: Normocephalic and atraumatic.     Mouth/Throat:     Mouth: Mucous membranes are moist.  Eyes:     General: No scleral icterus.       Right eye: No discharge.        Left eye: No discharge.     Conjunctiva/sclera: Conjunctivae normal.  Neck:     Musculoskeletal: Normal range of motion. No neck rigidity.  Cardiovascular:     Rate and Rhythm: Normal rate and regular rhythm.     Pulses: Normal pulses.     Heart sounds: Normal heart  sounds.  Pulmonary:     Effort: Pulmonary effort is normal. No respiratory distress.     Breath sounds: Normal breath sounds. No stridor.  Abdominal:     General: There is no distension.     Palpations: There is no mass.     Tenderness: There is abdominal tenderness (LLQ). There is left CVA tenderness. There is no right CVA tenderness or rebound.  Musculoskeletal:        General: No deformity.     Right lower leg: Edema present.     Left lower leg: Edema present.  Skin:    General: Skin is warm and dry.  Neurological:     General: No focal deficit present.     Mental Status: She is alert and oriented to person, place, and time.     Motor: No abnormal muscle tone.     Comments: Questionable confusion about how long her diuretics make her urinate for and concern about not urinating.  Otherwise A and O x3.  Moves all extremities spontaneously.  No slurred speech or facial droop.   Psychiatric:        Mood and Affect: Mood normal.        Behavior: Behavior normal.      ED Treatments / Results  Labs (all labs ordered are listed, but only abnormal results are displayed) Labs Reviewed  LACTIC ACID, PLASMA - Abnormal; Notable for the following components:      Result Value   Lactic Acid,  Venous 2.1 (*)    All other components within normal limits  COMPREHENSIVE METABOLIC PANEL - Abnormal; Notable for the following components:   Sodium 134 (*)    Potassium 2.4 (*)    Chloride 91 (*)    Glucose, Bld 117 (*)    BUN 25 (*)    Creatinine, Ser 1.02 (*)    Calcium 8.5 (*)    GFR calc non Af Amer 59 (*)    Anion gap 17 (*)    All other components within normal limits  CBC WITH DIFFERENTIAL/PLATELET - Abnormal; Notable for the following components:   WBC 18.3 (*)    Hemoglobin 10.8 (*)    HCT 35.6 (*)    RDW 17.9 (*)    Neutro Abs 16.3 (*)    Abs Immature Granulocytes 0.18 (*)    All other components within normal limits  URINALYSIS, ROUTINE W REFLEX MICROSCOPIC - Abnormal; Notable for the following components:   APPearance HAZY (*)    Hgb urine dipstick SMALL (*)    Nitrite POSITIVE (*)    Leukocytes,Ua LARGE (*)    WBC, UA >50 (*)    All other components within normal limits  SARS CORONAVIRUS 2 (HOSPITAL ORDER, [facility])  CULTURE, BLOOD (ROUTINE X 2)  CULTURE, BLOOD (ROUTINE X 2)  URINE CULTURE  BRAIN NATRIURETIC PEPTIDE  AMMONIA  TROPONIN I (HIGH SENSITIVITY)  LACTIC ACID, PLASMA  TROPONIN I (HIGH SENSITIVITY)  TROPONIN I (HIGH SENSITIVITY)  MAGNESIUM    EKG EKG Interpretation  Date/Time:  Sunday November 21 2018 16:12:48 EDT Ventricular Rate:  91 PR Interval:    QRS Duration: 94 QT Interval:  385 QTC Calculation: 474 R Axis:   80 Text Interpretation:  Sinus rhythm Consider anterior infarct Minimal ST depression, inferior leads Minimal ST dep lead II new since prior, no other significant abnormalities Confirmed by Gareth Morgan [phone]) on 11/21/2018 7:23:58 PM   Radiology Ct Head Wo Contrast  Result Date: 11/21/2018 CLINICAL  DATA:  Confusion, fatigue, vomiting EXAM: CT HEAD WITHOUT CONTRAST TECHNIQUE: Contiguous axial images were obtained from the base of the skull through the vertex without intravenous contrast.  COMPARISON:  12/09/2016 FINDINGS: Brain: No evidence of acute infarction, hemorrhage, hydrocephalus, extra-axial collection or mass lesion/mass effect. Mild periventricular white matter hypodensity. Vascular: No hyperdense vessel or unexpected calcification. Skull: Hyperostosis frontalis. Negative for fracture or focal lesion. Sinuses/Orbits: No acute finding. Other: None. IMPRESSION: No acute intracranial pathology. Mild small-vessel white matter disease. Electronically Signed   By: Eddie Candle M.D.   On: 11/21/2018 17:04   Ct Abdomen Pelvis W Contrast  Result Date: 11/21/2018 CLINICAL DATA:  63 y/o F; abdominal pain, back pain with nausea and vomiting, symptoms for 2 days. EXAM: CT ABDOMEN AND PELVIS WITH CONTRAST TECHNIQUE: Multidetector CT imaging of the abdomen and pelvis was performed using the standard protocol following bolus administration of intravenous contrast. CONTRAST:  169mL OMNIPAQUE IOHEXOL 300 MG/ML  SOLN COMPARISON:  10/20/2018 CT abdomen and pelvis. FINDINGS: Lower chest: No acute abnormality. Hepatobiliary: Hepatic steatosis. No focal liver abnormality is seen. No gallstones, gallbladder wall thickening, or biliary dilatation. Pancreas: Unremarkable. No pancreatic ductal dilatation or surrounding inflammatory changes. Spleen: Normal in size without focal abnormality. Adrenals/Urinary Tract: Adrenal glands are unremarkable. Bilateral nonobstructive nephrolithiasis. No hydronephrosis. Mild hypoenhancement of left kidney in comparison with the right kidney. Normal appearance of the bladder. Stomach/Bowel: Stomach is within normal limits. Stable prominent appendix stump without surrounding inflammatory changes. No evidence of bowel wall thickening, distention, or inflammatory changes. Diverticulosis, no findings of acute diverticulitis. Vascular/Lymphatic: Aortic atherosclerosis. No enlarged abdominal or pelvic lymph nodes. Reproductive: Uterus and bilateral adnexa are unremarkable. IUD well  seated in uterine fundus. Other: No abdominal wall hernia or abnormality. No abdominopelvic ascites. Musculoskeletal: No fracture is seen. Advanced spondylosis and mild dextrocurvature of the lumbar spine. Flowing anterior ossification of lower thoracic spine compatible with DISH. IMPRESSION: 1. Mild hypoenhancement of left kidney in comparison with the right kidney. Findings may represent a recently passed stone or possibly infection of the renal collecting system/pyelonephritis. 2. Bilateral nonobstructive nephrolithiasis. 3. Hepatic steatosis. 4. Diverticulosis without findings of acute diverticulitis. 5. Aortic Atherosclerosis (ICD10-I70.0). Electronically Signed   By: Kristine Garbe M.D.   On: 11/21/2018 20:55   Dg Chest Port 1 View  Result Date: 11/21/2018 CLINICAL DATA:  Confusion, vomiting, fatigue EXAM: PORTABLE CHEST 1 VIEW COMPARISON:  10/20/2018 FINDINGS: Cardiomegaly. Both lungs are clear. The visualized skeletal structures are unremarkable. IMPRESSION: Cardiomegaly without acute abnormality of the lungs in AP portable projection. Electronically Signed   By: Eddie Candle M.D.   On: 11/21/2018 17:02    Procedures Procedures (including critical care time)  Medications Ordered in ED Medications  potassium chloride 10 mEq in 100 mL IVPB (10 mEq Intravenous New Bag/Given 11/21/18 2140)  fosfomycin (MONUROL) packet 3 g (has no administration in time range)  morphine 4 MG/ML injection 4 mg (4 mg Intravenous Given 11/21/18 1739)  ondansetron (ZOFRAN) injection 4 mg (4 mg Intravenous Given 11/21/18 1849)  sodium chloride 0.9 % bolus 500 mL (500 mLs Intravenous New Bag/Given 11/21/18 1918)  potassium chloride 20 MEQ/15ML (10%) solution 40 mEq (40 mEq Oral Given 11/21/18 1957)  iohexol (OMNIPAQUE) 300 MG/ML solution 125 mL (125 mLs Intravenous Contrast Given 11/21/18 2024)  acetaminophen (TYLENOL) tablet 650 mg (650 mg Oral Given 11/21/18 2155)     Initial Impression / Assessment and Plan  / ED Course  I have reviewed the triage vital signs and the nursing notes.  Pertinent labs & imaging results that were available during my care of the patient were reviewed by me and considered in my medical decision making (see chart for details).  Clinical Course as of Nov 21 2255  Sun Nov 21, 2018  1853 Potassium(!!): 2.4 [EH]  1931 Was informed by RN that patient is refusing her p.o. potassium as she says she has a difficult time swallowing pills.  RN offered to dissolve the pills and applesauce however patient refused this.   [EH]  2200 Spoke with pharmacist regarding antibiotic choice, they recommend 3 g fosfomycin.   [EH]  2227 Spoke with Dr. Jonelle Sidle who will admit the patient.   [EH]    Clinical Course User Index [EH] Lorin Glass, PA-C      Patient presents today for evaluation of left-sided flank pain, confusion 2 days ago, nausea and generalized fatigue.  On exam she has left CVA tenderness to percussion.  She initially went to urgent care, notes reviewed from there show that she was tachycardic at the 110s.  She has been having subjective fevers and was recently admitted for E. coli sepsis.  Here she is not tachycardic and afebrile, however is borderline tachypneic.  Abs were obtained, she has a leukocytosis of 18.3.  Lactic acid is very slightly elevated at 2.1, she was not given full 30/kg fluid bolus as she was not hypotensive and her lactic was not above 4.  She has hypokalemia with a potassium of 2.4.  Her urine is hazy with small blood nitrite positive with over 50 WBCs, 0-5 red cells no bacteria seen and 0-5 squamous epithelial cells.  As patient has multiple allergies I discussed antibiotics for possible pyelonephritis with pharmacist who recommended fosfomycin.  Patient was started on p.o. potassium in addition to multiple rounds of IV potassium.  She was given a 500 cc fluid bolus based on her slightly elevated lactic acid.  Her pain was treated with morphine,  Zofran, and Tylenol.  Coronavirus test was negative.  Her ammonia is not elevated.  Based on reported confusion CT head was obtained which did not show acute abnormality.  Based on her pyelonephritis combined with her leukocytosis and confusion do not feel comfortable sending her home on outpatient treatment.  I spoke with Dr. Jonelle Sidle who agreed to admit the patient.    Final Clinical Impressions(s) / ED Diagnoses   Final diagnoses:  Pyelonephritis  Hypokalemia    ED Discharge Orders    None       Ollen Gross 11/22/18 Darlis Loan, MD 11/22/18 2231

## 2018-11-22 ENCOUNTER — Inpatient Hospital Stay (HOSPITAL_COMMUNITY): Payer: 59

## 2018-11-22 ENCOUNTER — Encounter (HOSPITAL_COMMUNITY): Payer: Self-pay | Admitting: *Deleted

## 2018-11-22 DIAGNOSIS — I5032 Chronic diastolic (congestive) heart failure: Secondary | ICD-10-CM

## 2018-11-22 DIAGNOSIS — A419 Sepsis, unspecified organism: Secondary | ICD-10-CM

## 2018-11-22 DIAGNOSIS — I10 Essential (primary) hypertension: Secondary | ICD-10-CM

## 2018-11-22 DIAGNOSIS — N390 Urinary tract infection, site not specified: Secondary | ICD-10-CM

## 2018-11-22 DIAGNOSIS — E892 Postprocedural hypoparathyroidism: Secondary | ICD-10-CM

## 2018-11-22 DIAGNOSIS — I4811 Longstanding persistent atrial fibrillation: Secondary | ICD-10-CM

## 2018-11-22 DIAGNOSIS — E038 Other specified hypothyroidism: Secondary | ICD-10-CM

## 2018-11-22 LAB — BLOOD CULTURE ID PANEL (REFLEXED)

## 2018-11-22 LAB — CBC
HCT: 34.6 % — ABNORMAL LOW (ref 36.0–46.0)
Hemoglobin: 10.4 g/dL — ABNORMAL LOW (ref 12.0–15.0)
MCH: 26.2 pg (ref 26.0–34.0)
MCHC: 30.1 g/dL (ref 30.0–36.0)
MCV: 87.2 fL (ref 80.0–100.0)
Platelets: 157 10*3/uL (ref 150–400)
RBC: 3.97 MIL/uL (ref 3.87–5.11)
RDW: 18.4 % — ABNORMAL HIGH (ref 11.5–15.5)
WBC: 21 10*3/uL — ABNORMAL HIGH (ref 4.0–10.5)
nRBC: 0 % (ref 0.0–0.2)

## 2018-11-22 LAB — LACTIC ACID, PLASMA
Lactic Acid, Venous: 2.7 mmol/L (ref 0.5–1.9)
Lactic Acid, Venous: 2.4 mmol/L (ref 0.5–1.9)

## 2018-11-22 LAB — MAGNESIUM
Magnesium: 1.9 mg/dL (ref 1.7–2.4)
Magnesium: 1.8 mg/dL (ref 1.7–2.4)

## 2018-11-22 LAB — PHOSPHORUS: Phosphorus: 4.5 mg/dL (ref 2.5–4.6)

## 2018-11-22 LAB — COMPREHENSIVE METABOLIC PANEL
ALT: 28 U/L (ref 0–44)
AST: 20 U/L (ref 15–41)
Albumin: 2.8 g/dL — ABNORMAL LOW (ref 3.5–5.0)
Alkaline Phosphatase: 98 U/L (ref 38–126)
Anion gap: 13 (ref 5–15)
BUN: 22 mg/dL (ref 8–23)
CO2: 26 mmol/L (ref 22–32)
Calcium: 7.7 mg/dL — ABNORMAL LOW (ref 8.9–10.3)
Chloride: 94 mmol/L — ABNORMAL LOW (ref 98–111)
Creatinine, Ser: 0.98 mg/dL (ref 0.44–1.00)
GFR calc Af Amer: >60 mL/min (ref >60)
GFR calc non Af Amer: >60 mL/min (ref >60)
Glucose, Bld: 123 mg/dL — ABNORMAL HIGH (ref 70–99)
Potassium: 2.7 mmol/L — CL (ref 3.5–5.1)
Sodium: 133 mmol/L — ABNORMAL LOW (ref 135–145)
Total Bilirubin: 1.1 mg/dL (ref 0.3–1.2)
Total Protein: 6.1 g/dL — ABNORMAL LOW (ref 6.5–8.1)

## 2018-11-22 LAB — MRSA PCR SCREENING: MRSA by PCR: POSITIVE — AB

## 2018-11-22 LAB — ECHOCARDIOGRAM COMPLETE
Height: 64 in
Weight: 4645.53 oz

## 2018-11-22 LAB — TSH: TSH: 0.582 u[IU]/mL (ref 0.350–4.500)

## 2018-11-22 LAB — TROPONIN I (HIGH SENSITIVITY): Troponin I (High Sensitivity): 10 ng/L (ref <18)

## 2018-11-22 LAB — POTASSIUM: Potassium: 2.8 mmol/L — ABNORMAL LOW (ref 3.5–5.1)

## 2018-11-22 MED ORDER — FUROSEMIDE 40 MG PO TABS
80.0000 mg | ORAL_TABLET | Freq: Every day | ORAL | Status: DC
  Administered 2018-11-22: 80 mg via ORAL
  Filled 2018-11-22: qty 2

## 2018-11-22 MED ORDER — HYDROCHLOROTHIAZIDE 25 MG PO TABS
25.0000 mg | ORAL_TABLET | Freq: Every day | ORAL | Status: DC
  Administered 2018-11-22: 25 mg via ORAL
  Filled 2018-11-22: qty 1

## 2018-11-22 MED ORDER — ONDANSETRON HCL 4 MG PO TABS: 4.0000 mg | ORAL_TABLET | Freq: Four times a day (QID) | ORAL | Status: DC | PRN

## 2018-11-22 MED ORDER — MORPHINE SULFATE (PF) 2 MG/ML IV SOLN
2.0000 mg | INTRAVENOUS | Status: DC | PRN
  Administered 2018-11-22 – 2018-11-24 (×8): 2 mg via INTRAVENOUS
  Filled 2018-11-22 (×8): qty 1

## 2018-11-22 MED ORDER — NYSTATIN 100000 UNIT/ML MT SUSP
5.0000 mL | Freq: Four times a day (QID) | OROMUCOSAL | Status: DC
  Administered 2018-11-22 – 2018-11-25 (×13): 500000 [IU] via OROMUCOSAL
  Filled 2018-11-22 (×12): qty 5

## 2018-11-22 MED ORDER — LEVOTHYROXINE SODIUM 50 MCG PO TABS
350.0000 ug | ORAL_TABLET | Freq: Every day | ORAL | Status: DC
  Administered 2018-11-22 – 2018-11-25 (×4): 350 ug via ORAL
  Filled 2018-11-22: qty 3
  Filled 2018-11-22: qty 1
  Filled 2018-11-22 (×2): qty 3

## 2018-11-22 MED ORDER — MUPIROCIN 2 % EX OINT
1.0000 "application " | TOPICAL_OINTMENT | Freq: Two times a day (BID) | CUTANEOUS | Status: DC
  Administered 2018-11-22 – 2018-11-25 (×8): 1 via NASAL
  Filled 2018-11-22: qty 22

## 2018-11-22 MED ORDER — DIPHENHYDRAMINE HCL 25 MG PO CAPS
25.0000 mg | ORAL_CAPSULE | Freq: Four times a day (QID) | ORAL | Status: DC | PRN
  Administered 2018-11-23 – 2018-11-24 (×2): 25 mg via ORAL
  Filled 2018-11-22 (×2): qty 1

## 2018-11-22 MED ORDER — MOMETASONE FURO-FORMOTEROL FUM 200-5 MCG/ACT IN AERO
2.0000 | INHALATION_SPRAY | Freq: Two times a day (BID) | RESPIRATORY_TRACT | Status: DC
  Administered 2018-11-22 – 2018-11-25 (×6): 2 via RESPIRATORY_TRACT
  Filled 2018-11-22: qty 8.8

## 2018-11-22 MED ORDER — TRIAMCINOLONE ACETONIDE 0.025 % EX CREA
TOPICAL_CREAM | Freq: Four times a day (QID) | CUTANEOUS | Status: DC
  Administered 2018-11-22 – 2018-11-24 (×4): via TOPICAL
  Filled 2018-11-22: qty 15

## 2018-11-22 MED ORDER — ONDANSETRON HCL 4 MG PO TABS: 4.0000 mg | ORAL_TABLET | Freq: Three times a day (TID) | ORAL | Status: DC | PRN

## 2018-11-22 MED ORDER — CALCITRIOL 0.5 MCG PO CAPS
2.0000 ug | ORAL_CAPSULE | Freq: Every day | ORAL | Status: DC
  Administered 2018-11-22 – 2018-11-25 (×4): 2 ug via ORAL
  Filled 2018-11-22 (×4): qty 4

## 2018-11-22 MED ORDER — EPINEPHRINE 0.3 MG/0.3ML IJ SOAJ
0.3000 mg | INTRAMUSCULAR | Status: DC | PRN
  Filled 2018-11-22: qty 0.6

## 2018-11-22 MED ORDER — ACETAMINOPHEN 325 MG PO TABS: 650.0000 mg | ORAL_TABLET | Freq: Four times a day (QID) | ORAL | Status: DC | PRN

## 2018-11-22 MED ORDER — ALBUTEROL SULFATE (2.5 MG/3ML) 0.083% IN NEBU: 2.5000 mg | INHALATION_SOLUTION | Freq: Four times a day (QID) | RESPIRATORY_TRACT | Status: DC | PRN

## 2018-11-22 MED ORDER — POTASSIUM CHLORIDE 10 MEQ/100ML IV SOLN
10.0000 meq | INTRAVENOUS | Status: AC
  Administered 2018-11-22 (×6): 10 meq via INTRAVENOUS
  Filled 2018-11-22 (×6): qty 100

## 2018-11-22 MED ORDER — SODIUM CHLORIDE 0.9 % IV SOLN
2.0000 g | Freq: Three times a day (TID) | INTRAVENOUS | Status: DC
  Administered 2018-11-22 – 2018-11-25 (×9): 2 g via INTRAVENOUS
  Filled 2018-11-22 (×12): qty 2

## 2018-11-22 MED ORDER — MONTELUKAST SODIUM 10 MG PO TABS
10.0000 mg | ORAL_TABLET | Freq: Every day | ORAL | Status: DC
  Administered 2018-11-22 – 2018-11-24 (×3): 10 mg via ORAL
  Filled 2018-11-22 (×3): qty 1

## 2018-11-22 MED ORDER — POTASSIUM CHLORIDE 20 MEQ/15ML (10%) PO SOLN: 40.0000 meq | Freq: Three times a day (TID) | ORAL | Status: DC

## 2018-11-22 MED ORDER — FUROSEMIDE 40 MG PO TABS: 40.0000 mg | ORAL_TABLET | Freq: Every day | ORAL | Status: DC

## 2018-11-22 MED ORDER — ALBUTEROL SULFATE HFA 108 (90 BASE) MCG/ACT IN AERS: 2.0000 | INHALATION_SPRAY | Freq: Four times a day (QID) | RESPIRATORY_TRACT | Status: DC | PRN

## 2018-11-22 MED ORDER — DICLOFENAC SODIUM 1 % TD GEL
2.0000 g | Freq: Four times a day (QID) | TRANSDERMAL | Status: DC
  Administered 2018-11-25: 2 g via TOPICAL
  Filled 2018-11-22: qty 100

## 2018-11-22 MED ORDER — PANTOPRAZOLE SODIUM 40 MG PO TBEC
40.0000 mg | DELAYED_RELEASE_TABLET | Freq: Every day | ORAL | Status: DC
  Administered 2018-11-22 – 2018-11-25 (×4): 40 mg via ORAL
  Filled 2018-11-22 (×4): qty 1

## 2018-11-22 MED ORDER — LACTATED RINGERS IV SOLN
INTRAVENOUS | Status: DC
  Administered 2018-11-22 – 2018-11-23 (×4): via INTRAVENOUS

## 2018-11-22 MED ORDER — CYANOCOBALAMIN 1000 MCG/ML IJ SOLN
1000.0000 ug | INTRAMUSCULAR | Status: DC
  Filled 2018-11-22: qty 1

## 2018-11-22 MED ORDER — MELATONIN 3 MG PO TABS
6.0000 mg | ORAL_TABLET | Freq: Every evening | ORAL | Status: DC | PRN
  Filled 2018-11-22: qty 2

## 2018-11-22 MED ORDER — APIXABAN 5 MG PO TABS
5.0000 mg | ORAL_TABLET | Freq: Two times a day (BID) | ORAL | Status: DC
  Administered 2018-11-22 – 2018-11-25 (×7): 5 mg via ORAL
  Filled 2018-11-22 (×7): qty 1

## 2018-11-22 MED ORDER — TRAMADOL HCL 50 MG PO TABS
50.0000 mg | ORAL_TABLET | Freq: Two times a day (BID) | ORAL | Status: DC | PRN
  Administered 2018-11-23: 50 mg via ORAL
  Filled 2018-11-22: qty 1

## 2018-11-22 MED ORDER — ONDANSETRON HCL 4 MG/2ML IJ SOLN
4.0000 mg | Freq: Four times a day (QID) | INTRAMUSCULAR | Status: DC | PRN
  Administered 2018-11-22: 4 mg via INTRAVENOUS
  Filled 2018-11-22: qty 2

## 2018-11-22 MED ORDER — HYDROCORTISONE 1 % EX CREA
TOPICAL_CREAM | Freq: Four times a day (QID) | CUTANEOUS | Status: DC | PRN
  Filled 2018-11-22: qty 28

## 2018-11-22 MED ORDER — MONTELUKAST SODIUM 10 MG PO TABS: 10.0000 mg | ORAL_TABLET | Freq: Every day | ORAL | Status: DC

## 2018-11-22 MED ORDER — VITAMIN D (ERGOCALCIFEROL) 1.25 MG (50000 UNIT) PO CAPS
50000.0000 [IU] | ORAL_CAPSULE | ORAL | Status: DC
  Administered 2018-11-22 – 2018-11-24 (×2): 50000 [IU] via ORAL
  Filled 2018-11-22 (×2): qty 1

## 2018-11-22 MED ORDER — SODIUM CHLORIDE 0.9 % IV SOLN
1.0000 g | Freq: Once | INTRAVENOUS | Status: AC
  Administered 2018-11-22: 1 g via INTRAVENOUS
  Filled 2018-11-22: qty 1

## 2018-11-22 MED ORDER — CALCIUM CARBONATE ANTACID 500 MG PO CHEW
2000.0000 mg | CHEWABLE_TABLET | Freq: Two times a day (BID) | ORAL | Status: DC
  Administered 2018-11-22 – 2018-11-25 (×6): 2000 mg via ORAL
  Filled 2018-11-22 (×7): qty 10

## 2018-11-22 MED ORDER — SODIUM CHLORIDE 0.9 % IV SOLN
1.0000 g | Freq: Three times a day (TID) | INTRAVENOUS | Status: DC
  Administered 2018-11-22: 1 g via INTRAVENOUS
  Filled 2018-11-22 (×5): qty 1

## 2018-11-22 MED ORDER — LORATADINE 10 MG PO TABS
10.0000 mg | ORAL_TABLET | Freq: Every evening | ORAL | Status: DC
  Administered 2018-11-22 – 2018-11-24 (×3): 10 mg via ORAL
  Filled 2018-11-22 (×3): qty 1

## 2018-11-22 MED ORDER — DILTIAZEM HCL ER COATED BEADS 240 MG PO CP24
240.0000 mg | ORAL_CAPSULE | Freq: Every day | ORAL | Status: DC
  Administered 2018-11-22 – 2018-11-24 (×3): 240 mg via ORAL
  Filled 2018-11-22 (×3): qty 1

## 2018-11-22 MED ORDER — NYSTATIN 100000 UNIT/GM EX CREA
1.0000 "application " | TOPICAL_CREAM | Freq: Two times a day (BID) | CUTANEOUS | Status: DC
  Administered 2018-11-22 – 2018-11-24 (×6): 1 via TOPICAL
  Filled 2018-11-22: qty 15

## 2018-11-22 NOTE — Progress Notes (Signed)
PHARMACY - PHYSICIAN COMMUNICATION CRITICAL VALUE ALERT - BLOOD CULTURE IDENTIFICATION (BCID)  [name] is an 63 y.o. female who presented to [facility] on 11/21/2018 with a chief complaint of acute pyelonephritis.  Assessment:  Currently on Aztreonam for acute pyelonephritis and history of recent Ecoli bacteremia and shock. Current BCID also showing Ecoli and no resistance detected. Patient has multiple allergies noted including allergies to Penicillin and Cephalosporin antibiotics.   Name of physician (or Provider) Contacted: Dr. Sherral Hammers  Current antibiotics: Aztreonam 1g IV every 8 hours (Fosfomycin x1 given on 6/28).   Changes to prescribed antibiotics recommended:  *Will increase Aztreonam to 2g IV every 8 hours for bacteremia dosing.   Results for orders placed or performed during the hospital encounter of 11/21/18  Blood Culture ID Panel (Reflexed) (Collected: 11/21/2018  4:30 PM)  Result Value Ref Range   Enterococcus species NOT DETECTED NOT DETECTED   Listeria monocytogenes NOT DETECTED NOT DETECTED   Staphylococcus species NOT DETECTED NOT DETECTED   Staphylococcus aureus (BCID) NOT DETECTED NOT DETECTED   Streptococcus species NOT DETECTED NOT DETECTED   Streptococcus agalactiae NOT DETECTED NOT DETECTED   Streptococcus pneumoniae NOT DETECTED NOT DETECTED   Streptococcus pyogenes NOT DETECTED NOT DETECTED   Acinetobacter baumannii NOT DETECTED NOT DETECTED   Enterobacteriaceae species DETECTED (A) NOT DETECTED   Enterobacter cloacae complex NOT DETECTED NOT DETECTED   Escherichia coli DETECTED (A) NOT DETECTED   Klebsiella oxytoca NOT DETECTED NOT DETECTED   Klebsiella pneumoniae NOT DETECTED NOT DETECTED   Proteus species NOT DETECTED NOT DETECTED   Serratia marcescens NOT DETECTED NOT DETECTED   Carbapenem resistance NOT DETECTED NOT DETECTED   Haemophilus influenzae NOT DETECTED NOT DETECTED   Neisseria meningitidis NOT DETECTED NOT DETECTED   Pseudomonas  aeruginosa NOT DETECTED NOT DETECTED   Candida albicans NOT DETECTED NOT DETECTED   Candida glabrata NOT DETECTED NOT DETECTED   Candida krusei NOT DETECTED NOT DETECTED   Candida parapsilosis NOT DETECTED NOT DETECTED   Candida tropicalis NOT DETECTED NOT DETECTED    Brain Hilts 11/22/2018  3:42 PM

## 2018-11-22 NOTE — Progress Notes (Signed)
  Echocardiogram 2D Echocardiogram has been performed.  Darlina Sicilian M 11/22/2018, 3:17 PM

## 2018-11-22 NOTE — Progress Notes (Signed)
CRITICAL VALUE ALERT  Critical Value:  Lactic Acid - 2.4   Date & Time Notified:  11/22/2018 @ 1110  Provider Notified: Dr. Rudi Heap, Ralene Muskrat

## 2018-11-22 NOTE — Progress Notes (Signed)
Pharmacy Antibiotic Note  [name] is a 63 y.o. female admitted on 11/21/2018 with acute pyelonephritis.  Pharmacy has been consulted for aztreonam dosing.  Pt rec'd fosfomycin in ED.  Plan: Aztreonam 1g IV Q8H.  Height: 5\' 4"  (162.6 cm) Weight: 290 lb 5.5 oz (131.7 kg) IBW/kg (Calculated) : 54.7  Temp (24hrs), Avg:99.6 F (37.6 C), Min:99 F (37.2 C), Max:100.1 F (37.8 C)  Recent Labs  Lab 11/21/18 1740 11/21/18 1742 11/21/18 2324  WBC  --  18.3*  --   CREATININE  --  1.02*  --   LATICACIDVEN 2.1*  --  2.7*    Estimated Creatinine Clearance: 78.2 mL/min (A) (by C-G formula based on SCr of 1.02 mg/dL (H)).    Allergies  Allergen Reactions  . Other Anaphylaxis    Reaction to nuts  . Amoxicillin-Pot Clavulanate Other (See Comments)    headache  . Ciprofloxacin Nausea Only and Rash  . Food Hives and Other (See Comments)    Potato  . Keflex [Cephalexin] Rash  . Penicillins Rash    Headache. And hives - Dose not tolerate Cephalosporin either   Has patient had a PCN reaction causing immediate rash, facial/tongue/throat swelling, SOB or lightheadedness with hypotension: No Has patient had a PCN reaction causing severe rash involving mucus membranes or skin necrosis: No Has patient had a PCN reaction that required hospitalization: No Has patient had a PCN reaction occurring within the last 10 years: Yes If all of the above answers are "NO", then may proceed with Cephalosporin [place]  . Rocephin [Ceftriaxone] Rash  . Tomato Hives    Thank you for allowing pharmacy to be a part of this patient's care.  Wynona Neat, PharmD, BCPS  11/22/2018 12:16 AM

## 2018-11-22 NOTE — Progress Notes (Signed)
PROGRESS NOTE    Jamie HALTIWANGER  HCW:[id number] DOB: [birth year] DOA: 11/21/2018 PCP: Debbrah Alar, NP   Brief Narrative:  63 y.o.WF PMHx fibromyalgia, thoracic spondylosis, morbid obesity, thyroid cancer s/p thyroidectomy, hypothyroidism, hypoparathyroidism, HTN, fibrillation (on Eliquis), aortic atherosclerosis, iron deficiency anemia, COPD, nephrolithiasis, fatty liver, chronic edema  Recent E. coli bacteremia On Oct 21, 2018, who was brought in for evaluation of weakness some fever and chills.  Patient has been fine since she left the hospital on May 31.  In the past 2 days however she has progressively gotten weak tired and developed some fever.  Patient also has some mild confusion.  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  She also has some nausea with 6 episodes of vomiting yesterday.  Denied any diarrhea denied any abdominal pain.  Patient noted strong smell of her urine and decreased volume.  No significant pain.  She came to the ER where she is evaluated and found to have another episodes of urinary tract infection.  She did have left back pain which turned out to be CVA tenderness.  At this point she is being readmitted with acute pyelonephritis..  ED Course: Temperature is 100.1 with blood pressure 93/52, pulse 115 respirate of 22 and oxygen sat 95% on room air.  Sodium 134 potassium 2.4 chloride 91 CO2 26 with glucose 117.  BUN 25 creatinine 1.02 calcium 8.5.  Gap of 17.  Magnesium 1.9.  BNP 56.  White count is 18.3 and hemoglobin 10.8.  Platelets 186.  Lactic acid of 2.7.  Urinalysis is positive for nitrite and large leukocytes.  WBC more than 50.  CT abdomen pelvis showed Abnormal findings of the left kidney probably reflecting pyelonephritis and bilateral nonobstructing nephrolithiasis.  Patient has received fosfomycin dose in the ER due to allergies.  She is being admitted for treatment.  Subjective: A/O x4 negative CP negative S  OB positive back pain, positive leg pain (chronic).  Patient does not weigh her self daily nor does she know her dry weight.  Sees a cardiologist but does not know his name.  Review of patient's chart shows that she sees Dr. Kirk Ruths cardiology last seen 07/01/2017    Assessment & Plan:   Principal Problem:   Pyelonephritis Active Problems:   Hypothyroidism   OBESITY, MORBID   Essential hypertension   GERD   Atrial fibrillation (HCC)   Hypokalemia   Urosepsis/Pyelonephritis -Upon admission patient met guidelines for sepsis HR> 90, RR> 20, WBC> 12.  Patient also had lactic acidosis 2.1 -point of infection urinary tract.  Chronic diastolic CHF -Strict in and out -Daily weight -Patient currently on the hypotensive side, hold Cardizem CD 240 mg if SBP<120 or DBP<80 - Lasix 80 mg q. breakfast, 40 mg_0 .  (Hold), secondary to patient's sepsis - Hydrochlorothiazide 25 mg every morning (hold) secondary patient sepsis - Patient grossly fluid overloaded unfortunately also septic.  Will recheck lactic acid in a.m. and once sepsis begins to defervesce will then begin diuresis. -Unknown based weight. - Echocardiogram pending  Atrial fibrillation -Currently NSR  Postoperative hypothyroidism/hypoparathyroidism -TSH pending -PTH pending -Synthroid 350 mcg  Hypokalemia -potassium goal>4 - Potassium IV 60 mEq -Recheck K/Mg 1600  COPD - CPAP per respiratory therapy  Morbid obesity  - Patient has been counseled concerning need for loss of weight.  Nausea and vomiting - Most likely secondary to her pyelonephritis  GERD -Protonix 40 mg daily     DVT  prophylaxis: Eliquis Code Status: DNR Family Communication: None Disposition Plan: TBD   Consultants:  None  Procedures/Significant Events:  6/28 PCXR:-Cardiomegaly without abnormality of the lungs 6/28 CT head WO contrast:-Negative acute abnormality 6/28 CT abdomen pelvis W contrast:-Mild hypoenhancement of left  kidney in comparison with the right kidney. Findings may represent a recently passed stone or possibly infection of the renal collecting system/pyelonephritis. -Bilateral nonobstructive nephrolithiasis. -Hepatic steatosis. -Diverticulosis without findings of acute diverticulitis. -Aortic Atherosclerosis      I have personally reviewed and interpreted all radiology studies and my findings are as above.  VENTILATOR SETTINGS:    Cultures  6/28 LEFT forearm blood NGTD 6/28 left antecubital NGTD 6/28 urine pending 6/28 SARS coronavirus negative  6/29 MRSA by PCR positive   Antimicrobials: Anti-infectives (From admission, onward)   Start     Stop   11/22/18 0800  aztreonam (AZACTAM) 1 g in sodium chloride 0.9 % 100 mL IVPB         11/22/18 0030  aztreonam (AZACTAM) 1 g in sodium chloride 0.9 % 100 mL IVPB     11/22/18 0205       Devices    LINES / TUBES:      Continuous Infusions:  aztreonam     lactated ringers 75 mL/hr at 11/22/18 0602     Objective: Vitals:   11/21/18 2130 11/21/18 2215 11/21/18 2358 11/22/18 0508  BP: (!) 120/59 123/64 (!) 108/49 (!) 106/53  Pulse: 82 90 85 (!) 103  Resp: _0 Temp:   100.1 F (37.8 C) 98 F (36.7 C)  TempSrc:   Oral   SpO2: 98% 97% 98% 96%  Weight:   131.7 kg   Height:   _1  (1.626 m)     Intake/Output Summary (Last 24 hours) at 11/22/2018 0801 Last data filed at 11/22/2018 0602 Gross per 24 hour  Intake 600 ml  Output 1000 ml  Net -400 ml   Filed Weights   11/21/18 2358  Weight: 131.7 kg    Examination:  General: A/O x4, no acute respiratory distress Eyes: negative scleral hemorrhage, negative anisocoria, negative icterus ENT: Negative Runny nose, negative gingival bleeding, Neck:  Negative scars, masses, torticollis, lymphadenopathy, JVD Lungs: Clear to auscultation bilaterally without wheezes or crackles Cardiovascular: Regular rate and rhythm without murmur gallop or rub normal S1 and  S2 Abdomen: MORBIDLY OBESE, negative abdominal pain, nondistended, positive soft, bowel sounds, no rebound, no ascites, no appreciable mass, unable to assess if patient has CVA tenderness secondary to body habitus. Extremities: Morbidly obese bilateral lower extremities with bilateral edema to hips 3+ Skin: Negative rashes, lesions, ulcers Psychiatric:  Negative depression, negative anxiety, negative fatigue, negative mania  Central nervous system:  Cranial nerves II through XII intact, tongue/uvula midline, all extremities muscle strength 5/5, sensation intact throughout, negative dysarthria, negative expressive aphasia, negative receptive aphasia.  .     Data Reviewed: Care during the described time interval was provided by me .  I have reviewed this patient's available data, including medical history, events of note, physical examination, and all test results as part of my evaluation.   CBC: Recent Labs  Lab 11/21/18 1742 11/22/18 0306  WBC 18.3* 21.0*  NEUTROABS 16.3*  --   HGB 10.8* 10.4*  HCT 35.6* 34.6*  MCV 87.5 87.2  PLT 186 659   Basic Metabolic Panel: Recent Labs  Lab 11/21/18 1742 11/21/18 2324 11/22/18 0545  NA 134*  --  133*  K 2.4*  --  2.7*  CL 91*  --  94*  CO2 26  --  26  GLUCOSE 117*  --  123*  BUN 25*  --  22  CREATININE 1.02*  --  0.98  CALCIUM 8.5*  --  7.7*  MG  --  1.9  --    GFR: Estimated Creatinine Clearance: 81.4 mL/min (by C-G formula based on SCr of 0.98 mg/dL). Liver Function Tests: Recent Labs  Lab 11/21/18 1742 11/22/18 0545  AST 25 20  ALT 34 28  ALKPHOS 116 98  BILITOT 1.2 1.1  PROT 7.4 6.1*  ALBUMIN 3.5 2.8*   No results for input(s): LIPASE, AMYLASE in the last 168 hours. Recent Labs  Lab 11/21/18 1740  AMMONIA 15   Coagulation Profile: No results for input(s): INR, PROTIME in the last 168 hours. Cardiac Enzymes: No results for input(s): CKTOTAL, CKMB, CKMBINDEX, TROPONINI in the last 168 hours. BNP (last 3  results) No results for input(s): PROBNP in the last 8760 hours. HbA1C: No results for input(s): HGBA1C in the last 72 hours. CBG: No results for input(s): GLUCAP in the last 168 hours. Lipid Profile: No results for input(s): CHOL, HDL, LDLCALC, TRIG, CHOLHDL, LDLDIRECT in the last 72 hours. Thyroid Function Tests: No results for input(s): TSH, T4TOTAL, FREET4, T3FREE, THYROIDAB in the last 72 hours. Anemia Panel: No results for input(s): VITAMINB12, FOLATE, FERRITIN, TIBC, IRON, RETICCTPCT in the last 72 hours. Urine analysis:    Component Value Date/Time   COLORURINE YELLOW 11/21/2018 2134   APPEARANCEUR HAZY (A) 11/21/2018 2134   LABSPEC 1.027 11/21/2018 2134   LABSPEC 1.010 06/09/2007 1500   PHURINE 6.0 11/21/2018 2134   GLUCOSEU NEGATIVE 11/21/2018 2134   GLUCOSEU NEGATIVE 02/16/2018 1202   HGBUR SMALL (A) 11/21/2018 2134   HGBUR negative 05/09/2010 1026   BILIRUBINUR NEGATIVE 11/21/2018 2134   BILIRUBINUR N 05/01/2016 1104   BILIRUBINUR Negative 06/09/2007 1500   KETONESUR NEGATIVE 11/21/2018 2134   PROTEINUR NEGATIVE 11/21/2018 2134   UROBILINOGEN 0.2 02/16/2018 1202   NITRITE POSITIVE (A) 11/21/2018 2134   LEUKOCYTESUR LARGE (A) 11/21/2018 2134   LEUKOCYTESUR Negative 06/09/2007 1500   Sepsis Labs: _0 (procalcitonin:4,lacticidven:4)  ) Recent Results (from the past 240 hour(s))  SARS Coronavirus 2 (CEPHEID- Performed in [place] hospital lab), Hosp Order     Status: None   Collection Time: 11/21/18  5:18 PM   Specimen: Nasopharyngeal Swab  Result Value Ref Range Status   SARS Coronavirus 2 NEGATIVE NEGATIVE Final    Comment: (NOTE) If result is NEGATIVE SARS-CoV-2 target nucleic acids are NOT DETECTED. The SARS-CoV-2 RNA is generally detectable in upper and lower  respiratory specimens during the acute phase of infection. The lowest  concentration of SARS-CoV-2 viral copies this assay can detect is 250  copies / mL. A negative result does not  preclude SARS-CoV-2 infection  and should not be used as the sole basis for treatment or other  patient management decisions.  A negative result may occur with  improper specimen collection / handling, submission of specimen other  than nasopharyngeal swab, presence of viral mutation(s) within the  areas targeted by this assay, and inadequate number of viral copies  (<250 copies / mL). A negative result must be combined with clinical  observations, patient history, and epidemiological information. If result is POSITIVE SARS-CoV-2 target nucleic acids are DETECTED. The SARS-CoV-2 RNA is generally detectable in upper and lower  respiratory specimens dur ing the acute phase of infection.  Positive  results are indicative of  active infection with SARS-CoV-2.  Clinical  correlation with patient history and other diagnostic information is  necessary to determine patient infection status.  Positive results do  not rule out bacterial infection or co-infection with other viruses. If result is PRESUMPTIVE POSTIVE SARS-CoV-2 nucleic acids MAY BE PRESENT.   A presumptive positive result was obtained on the submitted specimen  and confirmed on repeat testing.  While 2019 novel coronavirus  (SARS-CoV-2) nucleic acids may be present in the submitted sample  additional confirmatory testing may be necessary for epidemiological  and / or clinical management purposes  to differentiate between  SARS-CoV-2 and other Sarbecovirus currently known to infect humans.  If clinically indicated additional testing with an alternate test  methodology [phone]) is advised. The SARS-CoV-2 RNA is generally  detectable in upper and lower respiratory sp ecimens during the acute  phase of infection. The expected result is Negative. Fact Sheet for Patients:  StrictlyIdeas.no Fact Sheet for Healthcare Providers: BankingDealers.co.za This test is not yet approved or cleared by  the [place] FDA and has been authorized for detection and/or diagnosis of SARS-CoV-2 by FDA under an Emergency Use Authorization (EUA).  This EUA will remain in effect (meaning this test can be used) for the duration of the COVID-19 declaration under Section 564(b)(1) of the Act, 21 U.S.C. section 360bbb-3(b)(1), unless the authorization is terminated or revoked sooner. Performed at [facility], [place] [address] [place], [place] [address]   MRSA PCR Screening     Status: Abnormal   Collection Time: 11/22/18  2:15 AM   Specimen: Nasal Mucosa; Nasopharyngeal  Result Value Ref Range Status   MRSA by PCR POSITIVE (A) NEGATIVE Final    Comment:        The GeneXpert MRSA Assay (FDA approved for NASAL specimens only), is one component of a comprehensive MRSA colonization surveillance program. It is not intended to diagnose MRSA infection nor to guide or monitor treatment for MRSA infections. RESULT CALLED TO, READ BACK BY AND VERIFIED WITH: IRISH,N RN 11/22/2018 AT 0327 SKEEN,P Performed at [facility], [place] [address], [place], [place] [address]          Radiology Studies: Ct Head Wo Contrast  Result Date: 11/21/2018 CLINICAL DATA:  Confusion, fatigue, vomiting EXAM: CT HEAD WITHOUT CONTRAST TECHNIQUE: Contiguous axial images were obtained from the base of the skull through the vertex without intravenous contrast. COMPARISON:  12/09/2016 FINDINGS: Brain: No evidence of acute infarction, hemorrhage, hydrocephalus, extra-axial collection or mass lesion/mass effect. Mild periventricular white matter hypodensity. Vascular: No hyperdense vessel or unexpected calcification. Skull: Hyperostosis frontalis. Negative for fracture or focal lesion. Sinuses/Orbits: No acute finding. Other: None. IMPRESSION: No acute intracranial pathology. Mild small-vessel white matter disease. Electronically Signed   By: Eddie Candle M.D.   On: 11/21/2018 17:04   Ct Abdomen Pelvis W  Contrast  Result Date: 11/21/2018 CLINICAL DATA:  63 y/o F; abdominal pain, back pain with nausea and vomiting, symptoms for 2 days. EXAM: CT ABDOMEN AND PELVIS WITH CONTRAST TECHNIQUE: Multidetector CT imaging of the abdomen and pelvis was performed using the standard protocol following bolus administration of intravenous contrast. CONTRAST:  171m OMNIPAQUE IOHEXOL 300 MG/ML  SOLN COMPARISON:  10/20/2018 CT abdomen and pelvis. FINDINGS: Lower chest: No acute abnormality. Hepatobiliary: Hepatic steatosis. No focal liver abnormality is seen. No gallstones, gallbladder wall thickening, or biliary dilatation. Pancreas: Unremarkable. No pancreatic ductal dilatation or surrounding inflammatory changes. Spleen: Normal in size without focal abnormality. Adrenals/Urinary Tract: Adrenal glands  are unremarkable. Bilateral nonobstructive nephrolithiasis. No hydronephrosis. Mild hypoenhancement of left kidney in comparison with the right kidney. Normal appearance of the bladder. Stomach/Bowel: Stomach is within normal limits. Stable prominent appendix stump without surrounding inflammatory changes. No evidence of bowel wall thickening, distention, or inflammatory changes. Diverticulosis, no findings of acute diverticulitis. Vascular/Lymphatic: Aortic atherosclerosis. No enlarged abdominal or pelvic lymph nodes. Reproductive: Uterus and bilateral adnexa are unremarkable. IUD well seated in uterine fundus. Other: No abdominal wall hernia or abnormality. No abdominopelvic ascites. Musculoskeletal: No fracture is seen. Advanced spondylosis and mild dextrocurvature of the lumbar spine. Flowing anterior ossification of lower thoracic spine compatible with DISH. IMPRESSION: 1. Mild hypoenhancement of left kidney in comparison with the right kidney. Findings may represent a recently passed stone or possibly infection of the renal collecting system/pyelonephritis. 2. Bilateral nonobstructive nephrolithiasis. 3. Hepatic steatosis. 4.  Diverticulosis without findings of acute diverticulitis. 5. Aortic Atherosclerosis (ICD10-I70.0). Electronically Signed   By: Kristine Garbe M.D.   On: 11/21/2018 20:55   Dg Chest Port 1 View  Result Date: 11/21/2018 CLINICAL DATA:  Confusion, vomiting, fatigue EXAM: PORTABLE CHEST 1 VIEW COMPARISON:  10/20/2018 FINDINGS: Cardiomegaly. Both lungs are clear. The visualized skeletal structures are unremarkable. IMPRESSION: Cardiomegaly without acute abnormality of the lungs in AP portable projection. Electronically Signed   By: Eddie Candle M.D.   On: 11/21/2018 17:02        Scheduled Meds:  apixaban  5 mg Oral BID   calcitRIOL  2 mcg Oral Daily   calcium carbonate  2,000 mg Oral BID   cyanocobalamin  1,000 mcg Subcutaneous Q30 days   diclofenac sodium  2 g Topical QID   diltiazem  240 mg Oral Daily   furosemide  40 mg Oral q1800   furosemide  80 mg Oral Q breakfast   hydrochlorothiazide  25 mg Oral Q breakfast   levothyroxine  350 mcg Oral Q0600   loratadine  10 mg Oral QPM   mometasone-formoterol  2 puff Inhalation BID   montelukast  10 mg Oral QHS   mupirocin ointment  1 application Nasal BID   nystatin  5 mL Mouth/Throat QID   nystatin cream  1 application Topical BID   pantoprazole  40 mg Oral Daily   potassium chloride  40 mEq Oral TID   triamcinolone   Topical QID   Vitamin D (Ergocalciferol)  50,000 Units Oral Q M,W,F   Continuous Infusions:  aztreonam     lactated ringers 75 mL/hr at 11/22/18 0602     LOS: 1 day   The patient is critically ill with multiple organ systems failure and requires high complexity decision making for assessment and support, frequent evaluation and titration of therapies, application of advanced monitoring technologies and extensive interpretation of multiple databases. Critical Care Time devoted to patient care services described in this note  Time spent: 40 minutes     Jema Deegan, Geraldo Docker, MD [facility] Pager [phone]  If 7PM-7AM, please contact night-coverage [email or web address] Password [email or web address] 11/22/2018, 8:01 AM

## 2018-11-22 NOTE — Progress Notes (Signed)
CRITICAL VALUE ALERT  Critical Value:  K+ 2.7 ** Date & Time Notied:  11/22/2018 0705  Provider Notified: Dia Crawford  Orders Received/Actions taken:MD aware

## 2018-11-22 NOTE — Progress Notes (Signed)
Paged Dr. Sherral Hammers with critical value -K+ 2.7 this am. 2nd page. Notified day RN, Leanord Hawking.

## 2018-11-23 ENCOUNTER — Telehealth: Payer: 59 | Admitting: Family

## 2018-11-23 ENCOUNTER — Ambulatory Visit: Payer: 59 | Admitting: Family

## 2018-11-23 DIAGNOSIS — I480 Paroxysmal atrial fibrillation: Secondary | ICD-10-CM

## 2018-11-23 DIAGNOSIS — E876 Hypokalemia: Secondary | ICD-10-CM

## 2018-11-23 LAB — CBC
HCT: 30 % — ABNORMAL LOW (ref 36.0–46.0)
Hemoglobin: 9 g/dL — ABNORMAL LOW (ref 12.0–15.0)
MCH: 26 pg (ref 26.0–34.0)
MCHC: 30 g/dL (ref 30.0–36.0)
MCV: 86.7 fL (ref 80.0–100.0)
Platelets: UNDETERMINED 10*3/uL (ref 150–400)
RBC: 3.46 MIL/uL — ABNORMAL LOW (ref 3.87–5.11)
RDW: 17.4 % — ABNORMAL HIGH (ref 11.5–15.5)
WBC: 12.3 10*3/uL — ABNORMAL HIGH (ref 4.0–10.5)
nRBC: 0 % (ref 0.0–0.2)

## 2018-11-23 LAB — BASIC METABOLIC PANEL
Anion gap: 10 (ref 5–15)
BUN: 14 mg/dL (ref 8–23)
CO2: 27 mmol/L (ref 22–32)
Calcium: 7.7 mg/dL — ABNORMAL LOW (ref 8.9–10.3)
Chloride: 95 mmol/L — ABNORMAL LOW (ref 98–111)
Creatinine, Ser: 0.68 mg/dL (ref 0.44–1.00)
GFR calc Af Amer: >60 mL/min (ref >60)
GFR calc non Af Amer: >60 mL/min (ref >60)
Glucose, Bld: 107 mg/dL — ABNORMAL HIGH (ref 70–99)
Potassium: 2.6 mmol/L — CL (ref 3.5–5.1)
Sodium: 132 mmol/L — ABNORMAL LOW (ref 135–145)

## 2018-11-23 LAB — PARATHYROID HORMONE, INTACT (NO CA): PTH: 8 pg/mL — ABNORMAL LOW (ref 15–65)

## 2018-11-23 LAB — MAGNESIUM: Magnesium: 1.8 mg/dL (ref 1.7–2.4)

## 2018-11-23 LAB — LACTIC ACID, PLASMA: Lactic Acid, Venous: 1.3 mmol/L (ref 0.5–1.9)

## 2018-11-23 MED ORDER — POTASSIUM CHLORIDE 10 MEQ/100ML IV SOLN
10.0000 meq | INTRAVENOUS | Status: AC
  Administered 2018-11-23 (×2): 10 meq via INTRAVENOUS
  Filled 2018-11-23 (×2): qty 100

## 2018-11-23 MED ORDER — POTASSIUM CHLORIDE CRYS ER 20 MEQ PO TBCR
30.0000 meq | EXTENDED_RELEASE_TABLET | Freq: Once | ORAL | Status: AC
  Administered 2018-11-23: 10:00:00 30 meq via ORAL
  Filled 2018-11-23: qty 1

## 2018-11-23 MED ORDER — POTASSIUM CHLORIDE 10 MEQ/100ML IV SOLN
10.0000 meq | INTRAVENOUS | Status: AC
  Administered 2018-11-23 (×3): 10 meq via INTRAVENOUS
  Filled 2018-11-23 (×3): qty 100

## 2018-11-23 MED ORDER — POTASSIUM CHLORIDE CRYS ER 20 MEQ PO TBCR: 40.0000 meq | EXTENDED_RELEASE_TABLET | Freq: Once | ORAL | Status: DC

## 2018-11-23 NOTE — Progress Notes (Signed)
Patient refuses CPAP for the night  

## 2018-11-23 NOTE — Progress Notes (Signed)
PROGRESS NOTE    [name]  QMV:[id number] DOB: 08-30-1955 DOA: 11/21/2018 PCP: Debbrah Alar, NP   Brief Narrative:  63 y.o.WF PMHx fibromyalgia, thoracic spondylosis, morbid obesity, thyroid cancer s/p thyroidectomy, hypothyroidism, hypoparathyroidism, HTN, fibrillation (on Eliquis), aortic atherosclerosis, iron deficiency anemia, COPD, nephrolithiasis, fatty liver, chronic edema  Recent E. coli bacteremia On Oct 21, 2018, who was brought in for evaluation of weakness some fever and chills.  Patient has been fine since she left the hospital on May 31.  In the past 2 days however she has progressively gotten weak tired and developed some fever.  Patient also has some mild confusion.  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  She also has some nausea with 6 episodes of vomiting yesterday.  Denied any diarrhea denied any abdominal pain.  Patient noted strong smell of her urine and decreased volume.  No significant pain.  She came to the ER where she is evaluated and found to have another episodes of urinary tract infection.  She did have left back pain which turned out to be CVA tenderness.  At this point she is being readmitted with acute pyelonephritis..  ED Course: Temperature is 100.1 with blood pressure 93/52, pulse 115 respirate of 22 and oxygen sat 95% on room air.  Sodium 134 potassium 2.4 chloride 91 CO2 26 with glucose 117.  BUN 25 creatinine 1.02 calcium 8.5.  Gap of 17.  Magnesium 1.9.  BNP 56.  White count is 18.3 and hemoglobin 10.8.  Platelets 186.  Lactic acid of 2.7.  Urinalysis is positive for nitrite and large leukocytes.  WBC more than 50.  CT abdomen pelvis showed Abnormal findings of the left kidney probably reflecting pyelonephritis and bilateral nonobstructing nephrolithiasis.  Patient has received fosfomycin dose in the ER due to allergies.  She is being admitted for treatment.  Subjective: 6/30 A/O x4, negative CP,  negative S OB.  Sitting in chair comfortably.   Assessment & Plan:   Principal Problem:   Pyelonephritis Active Problems:   Hypothyroidism   OBESITY, MORBID   Essential hypertension   GERD   Atrial fibrillation (HCC)   Hypokalemia   Urosepsis/Pyelonephritis -Upon admission patient met guidelines for sepsis HR> 90, RR> 20, WBC> 12.  Patient also had lactic acidosis 2.1 -point of infection urinary tract.   Recurrent bloating lactic acidosis resolved -DC lactated Ringer's  Chronic diastolic CHF -Strict in and out +464.25m -Daily weight Filed Weights   11/21/18 2358  Weight: 131.7 kg  -Patient currently on the hypotensive side, hold Cardizem CD 240 mg if SBP<120 or DBP<80 - Lasix 80 mg q. breakfast, 40 mg_0 .  (Hold), secondary to patient's sepsis - Hydrochlorothiazide 25 mg every morning (hold) secondary patient sepsis - Patient grossly fluid overloaded unfortunately also septic.  Will recheck lactic acid in a.m. and once sepsis begins to defervesce will then begin diuresis. -Unknown based weight. - Echocardiogram: Showed mild diastolic CHF see results below -6/30 patient remains baseline hypotensive hold restarting diuretics.  Would restart on 7/1  Atrial fibrillation -Currently NSR  Postoperative hypothyroidism/hypoparathyroidism -TSH pending WNL -PTH pending -Synthroid 350 mcg  Hypokalemia -Potassium goal>4 -potassium IV 30 mg -Potassium p.o. 30 mEq - Recheck K/Mg _1   COPD - CPAP per respiratory therapy  Morbid obesity  - Patient has been counseled concerning need for loss of weight.  Nausea and vomiting - Most likely secondary to her pyelonephritis  GERD -Protonix 40 mg daily  DVT prophylaxis: Eliquis Code Status: DNR Family Communication: None Disposition Plan: TBD   Consultants:  None  Procedures/Significant Events:  6/28 PCXR:-Cardiomegaly without abnormality of the lungs 6/28 CT head WO contrast:-Negative acute abnormality 6/28  CT abdomen pelvis W contrast:-Mild hypoenhancement of left kidney in comparison with the right kidney. Findings may represent a recently passed stone or possibly infection of the renal collecting system/pyelonephritis. -Bilateral nonobstructive nephrolithiasis. -Hepatic steatosis. -Diverticulosis without findings of acute diverticulitis. -Aortic Atherosclerosis 6/29 echocardiogram:Left Ventricle: LVEF = 50-55%.  - Doppler parameters are consistent with pseudonormalization.      I have personally reviewed and interpreted all radiology studies and my findings are as above.  VENTILATOR SETTINGS:    Cultures  6/28 LEFT forearm blood NGTD 6/28 left antecubital NGTD 6/28 urine pending 6/28 SARS coronavirus negative  6/29 MRSA by PCR positive   Antimicrobials: Anti-infectives (From admission, onward)   Start     Stop   11/22/18 0800  aztreonam (AZACTAM) 1 g in sodium chloride 0.9 % 100 mL IVPB         11/22/18 0030  aztreonam (AZACTAM) 1 g in sodium chloride 0.9 % 100 mL IVPB     11/22/18 0205       Devices    LINES / TUBES:      Continuous Infusions: . aztreonam 2 g (11/23/18 0836)  . lactated ringers 125 mL/hr at 11/23/18 0627  . potassium chloride 10 mEq (11/23/18 0853)     Objective: Vitals:   11/22/18 2030 11/23/18 0523 11/23/18 0623 11/23/18 0721  BP: (!) 121/52 (!) 118/56    Pulse: 84 85  79  Resp: 17 (!) _0 Temp: 100 F (37.8 C) 99.1 F (37.3 C)    TempSrc: Oral Oral    SpO2: 94% 95%  93%  Weight:      Height:        Intake/Output Summary (Last 24 hours) at 11/23/2018 0857 Last data filed at 11/23/2018 1610 Gross per 24 hour  Intake 3764.8 ml  Output 2900 ml  Net 864.8 ml   Filed Weights   11/21/18 2358  Weight: 131.7 kg    Physical Exam:  General: A/O x4, no acute respiratory distress Eyes: negative scleral hemorrhage, negative anisocoria, negative icterus ENT: Negative Runny nose, negative gingival bleeding, Neck:  Negative  scars, masses, torticollis, lymphadenopathy, JVD Lungs: Clear to auscultation bilaterally without wheezes or crackles Cardiovascular: Regular rate and rhythm without murmur gallop or rub normal S1 and S2 Abdomen: Morbidly OBESE, negative abdominal pain, nondistended, positive soft, bowel sounds, no rebound, no ascites, no appreciable mass Extremities: Positive bilateral lower extremity edema 3+ to hips.  Skin: Negative rashes, lesions, ulcers Psychiatric:  Negative depression, negative anxiety, negative fatigue, negative mania  Central nervous system:  Cranial nerves II through XII intact, tongue/uvula midline, all extremities muscle strength 5/5, sensation intact throughout, negative dysarthria, negative expressive aphasia, negative receptive aphasia. .     Data Reviewed: Care during the described time interval was provided by me .  I have reviewed this patient's available data, including medical history, events of note, physical examination, and all test results as part of my evaluation.   CBC: Recent Labs  Lab 11/21/18 1742 11/22/18 0306 11/23/18 0334  WBC 18.3* 21.0* 12.3*  NEUTROABS 16.3*  --   --   HGB 10.8* 10.4* 9.0*  HCT 35.6* 34.6* 30.0*  MCV 87.5 87.2 86.7  PLT 186 157 PLATELET CLUMPS NOTED ON SMEAR, UNABLE TO ESTIMATE   Basic Metabolic Panel:  Recent Labs  Lab 11/21/18 1742 11/21/18 2324 11/22/18 0545 11/22/18 0855 11/22/18 1558 11/23/18 0334  NA 134*  --  133*  --   --  132*  K 2.4*  --  2.7*  --  2.8* 2.6*  CL 91*  --  94*  --   --  95*  CO2 26  --  26  --   --  27  GLUCOSE 117*  --  123*  --   --  107*  BUN 25*  --  22  --   --  14  CREATININE 1.02*  --  0.98  --   --  0.68  CALCIUM 8.5*  --  7.7*  --   --  7.7*  MG  --  1.9  --  1.9 1.8 1.8  PHOS  --   --   --  4.5  --   --    GFR: Estimated Creatinine Clearance: 99.7 mL/min (by C-G formula based on SCr of 0.68 mg/dL). Liver Function Tests: Recent Labs  Lab 11/21/18 1742 11/22/18 0545  AST 25 20   ALT 34 28  ALKPHOS 116 98  BILITOT 1.2 1.1  PROT 7.4 6.1*  ALBUMIN 3.5 2.8*   No results for input(s): LIPASE, AMYLASE in the last 168 hours. Recent Labs  Lab 11/21/18 1740  AMMONIA 15   Coagulation Profile: No results for input(s): INR, PROTIME in the last 168 hours. Cardiac Enzymes: No results for input(s): CKTOTAL, CKMB, CKMBINDEX, TROPONINI in the last 168 hours. BNP (last 3 results) No results for input(s): PROBNP in the last 8760 hours. HbA1C: No results for input(s): HGBA1C in the last 72 hours. CBG: No results for input(s): GLUCAP in the last 168 hours. Lipid Profile: No results for input(s): CHOL, HDL, LDLCALC, TRIG, CHOLHDL, LDLDIRECT in the last 72 hours. Thyroid Function Tests: Recent Labs    11/22/18 1558  TSH 0.582   Anemia Panel: No results for input(s): VITAMINB12, FOLATE, FERRITIN, TIBC, IRON, RETICCTPCT in the last 72 hours. Urine analysis:    Component Value Date/Time   COLORURINE YELLOW 11/21/2018 2134   APPEARANCEUR HAZY (A) 11/21/2018 2134   LABSPEC 1.027 11/21/2018 2134   LABSPEC 1.010 06/09/2007 1500   PHURINE 6.0 11/21/2018 2134   GLUCOSEU NEGATIVE 11/21/2018 2134   GLUCOSEU NEGATIVE 02/16/2018 1202   HGBUR SMALL (A) 11/21/2018 2134   HGBUR negative 05/09/2010 1026   BILIRUBINUR NEGATIVE 11/21/2018 2134   BILIRUBINUR N 05/01/2016 1104   BILIRUBINUR Negative 06/09/2007 1500   KETONESUR NEGATIVE 11/21/2018 2134   PROTEINUR NEGATIVE 11/21/2018 2134   UROBILINOGEN 0.2 02/16/2018 1202   NITRITE POSITIVE (A) 11/21/2018 2134   LEUKOCYTESUR LARGE (A) 11/21/2018 2134   LEUKOCYTESUR Negative 06/09/2007 1500   Sepsis Labs: _0 (procalcitonin:4,lacticidven:4)  ) Recent Results (from the past 240 hour(s))  Blood Culture (routine x 2)     Status: None (Preliminary result)   Collection Time: 11/21/18  4:27 PM   Specimen: BLOOD LEFT FOREARM  Result Value Ref Range Status   Specimen Description BLOOD LEFT FOREARM  Final   Special Requests    Final    AEROBIC BOTTLE ONLY Blood Culture results may not be optimal due to an inadequate volume of blood received in culture bottles   Culture   Final    NO GROWTH 2 DAYS Performed at [facility], [place] [address] [place], [place] [address]    Report Status PENDING  Incomplete  Blood Culture (routine x 2)     Status:  Abnormal (Preliminary result)   Collection Time: 11/21/18  4:30 PM   Specimen: BLOOD  Result Value Ref Range Status   Specimen Description BLOOD LEFT ANTECUBITAL  Final   Special Requests   Final    BOTTLES DRAWN AEROBIC AND ANAEROBIC Blood Culture adequate volume   Culture  Setup Time   Final    GRAM NEGATIVE RODS AEROBIC BOTTLE ONLY CRITICAL RESULT CALLED TO, READ BACK BY AND VERIFIED WITH: Hyman Bible PharmD 15:40 11/22/18 ([email or web address])    Culture (A)  Final    ESCHERICHIA COLI SUSCEPTIBILITIES TO FOLLOW Performed at [facility], [place] [address], [place], [place] [address]    Report Status PENDING  Incomplete  Blood Culture ID Panel (Reflexed)     Status: Abnormal   Collection Time: 11/21/18  4:30 PM  Result Value Ref Range Status   Enterococcus species NOT DETECTED NOT DETECTED Final   Listeria monocytogenes NOT DETECTED NOT DETECTED Final   Staphylococcus species NOT DETECTED NOT DETECTED Final   Staphylococcus aureus (BCID) NOT DETECTED NOT DETECTED Final   Streptococcus species NOT DETECTED NOT DETECTED Final   Streptococcus agalactiae NOT DETECTED NOT DETECTED Final   Streptococcus pneumoniae NOT DETECTED NOT DETECTED Final   Streptococcus pyogenes NOT DETECTED NOT DETECTED Final   Acinetobacter baumannii NOT DETECTED NOT DETECTED Final   Enterobacteriaceae species DETECTED (A) NOT DETECTED Final    Comment: Enterobacteriaceae represent a large family of gram-negative bacteria, not a single organism. CRITICAL RESULT CALLED TO, READ BACK BY AND VERIFIED WITH: Hyman Bible PharmD 15:40 11/22/18 (wilsonm)    Enterobacter cloacae complex NOT DETECTED  NOT DETECTED Final   Escherichia coli DETECTED (A) NOT DETECTED Final    Comment: CRITICAL RESULT CALLED TO, READ BACK BY AND VERIFIED WITH: Hyman Bible PharmD 15:40 11/22/18 (wilsonm)    Klebsiella oxytoca NOT DETECTED NOT DETECTED Final   Klebsiella pneumoniae NOT DETECTED NOT DETECTED Final   Proteus species NOT DETECTED NOT DETECTED Final   Serratia marcescens NOT DETECTED NOT DETECTED Final   Carbapenem resistance NOT DETECTED NOT DETECTED Final   Haemophilus influenzae NOT DETECTED NOT DETECTED Final   Neisseria meningitidis NOT DETECTED NOT DETECTED Final   Pseudomonas aeruginosa NOT DETECTED NOT DETECTED Final   Candida albicans NOT DETECTED NOT DETECTED Final   Candida glabrata NOT DETECTED NOT DETECTED Final   Candida krusei NOT DETECTED NOT DETECTED Final   Candida parapsilosis NOT DETECTED NOT DETECTED Final   Candida tropicalis NOT DETECTED NOT DETECTED Final    Comment: Performed at [facility], [place] [address], [place], [place] [address]  SARS Coronavirus 2 (CEPHEID- Performed in [facility]), Hosp Order     Status: None   Collection Time: 11/21/18  5:18 PM   Specimen: Nasopharyngeal Swab  Result Value Ref Range Status   SARS Coronavirus 2 NEGATIVE NEGATIVE Final    Comment: (NOTE) If result is NEGATIVE SARS-CoV-2 target nucleic acids are NOT DETECTED. The SARS-CoV-2 RNA is generally detectable in upper and lower  respiratory specimens during the acute phase of infection. The lowest  concentration of SARS-CoV-2 viral copies this assay can detect is 250  copies / mL. A negative result does not preclude SARS-CoV-2 infection  and should not be used as the sole basis for treatment or other  patient management decisions.  A negative result may occur with  improper specimen collection / handling, submission of specimen other  than nasopharyngeal swab, presence of viral mutation(s) within the  areas targeted by  this assay, and inadequate number of viral  copies  (<250 copies / mL). A negative result must be combined with clinical  observations, patient history, and epidemiological information. If result is POSITIVE SARS-CoV-2 target nucleic acids are DETECTED. The SARS-CoV-2 RNA is generally detectable in upper and lower  respiratory specimens dur ing the acute phase of infection.  Positive  results are indicative of active infection with SARS-CoV-2.  Clinical  correlation with patient history and other diagnostic information is  necessary to determine patient infection status.  Positive results do  not rule out bacterial infection or co-infection with other viruses. If result is PRESUMPTIVE POSTIVE SARS-CoV-2 nucleic acids MAY BE PRESENT.   A presumptive positive result was obtained on the submitted specimen  and confirmed on repeat testing.  While 2019 novel coronavirus  (SARS-CoV-2) nucleic acids may be present in the submitted sample  additional confirmatory testing may be necessary for epidemiological  and / or clinical management purposes  to differentiate between  SARS-CoV-2 and other Sarbecovirus currently known to infect humans.  If clinically indicated additional testing with an alternate test  methodology [phone]) is advised. The SARS-CoV-2 RNA is generally  detectable in upper and lower respiratory sp ecimens during the acute  phase of infection. The expected result is Negative. Fact Sheet for Patients:  StrictlyIdeas.no Fact Sheet for Healthcare Providers: BankingDealers.co.za This test is not yet approved or cleared by the [place] FDA and has been authorized for detection and/or diagnosis of SARS-CoV-2 by FDA under an Emergency Use Authorization (EUA).  This EUA will remain in effect (meaning this test can be used) for the duration of the COVID-19 declaration under Section 564(b)(1) of the Act, 21 U.S.C. section 360bbb-3(b)(1), unless the authorization is terminated  or revoked sooner. Performed at [facility], [place] [address], [place], [place] [address]   Urine culture     Status: Abnormal (Preliminary result)   Collection Time: 11/21/18  9:48 PM   Specimen: Urine, Random  Result Value Ref Range Status   Specimen Description URINE, RANDOM  Final   Special Requests   Final    NONE Performed at [facility], [place] [address], [place], [place] [address]    Culture >=100,000 COLONIES/mL GRAM NEGATIVE RODS (A)  Final   Report Status PENDING  Incomplete  MRSA PCR Screening     Status: Abnormal   Collection Time: 11/22/18  2:15 AM   Specimen: Nasal Mucosa; Nasopharyngeal  Result Value Ref Range Status   MRSA by PCR POSITIVE (A) NEGATIVE Final    Comment:        The GeneXpert MRSA Assay (FDA approved for NASAL specimens only), is one component of a comprehensive MRSA colonization surveillance program. It is not intended to diagnose MRSA infection nor to guide or monitor treatment for MRSA infections. RESULT CALLED TO, READ BACK BY AND VERIFIED WITH: IRISH,N RN 11/22/2018 AT 0327 SKEEN,P Performed at [facility], [place] [address], [place], [place] [address]          Radiology Studies: Ct Head Wo Contrast  Result Date: 11/21/2018 CLINICAL DATA:  Confusion, fatigue, vomiting EXAM: CT HEAD WITHOUT CONTRAST TECHNIQUE: Contiguous axial images were obtained from the base of the skull through the vertex without intravenous contrast. COMPARISON:  12/09/2016 FINDINGS: Brain: No evidence of acute infarction, hemorrhage, hydrocephalus, extra-axial collection or mass lesion/mass effect. Mild periventricular white matter hypodensity. Vascular: No hyperdense vessel or unexpected calcification. Skull: Hyperostosis frontalis. Negative for fracture or focal lesion. Sinuses/Orbits:  No acute finding. Other: None. IMPRESSION: No acute intracranial pathology. Mild small-vessel white matter disease. Electronically Signed   By: Eddie Candle M.D.   On:  11/21/2018 17:04   Ct Abdomen Pelvis W Contrast  Result Date: 11/21/2018 CLINICAL DATA:  63 y/o F; abdominal pain, back pain with nausea and vomiting, symptoms for 2 days. EXAM: CT ABDOMEN AND PELVIS WITH CONTRAST TECHNIQUE: Multidetector CT imaging of the abdomen and pelvis was performed using the standard protocol following bolus administration of intravenous contrast. CONTRAST:  169m OMNIPAQUE IOHEXOL 300 MG/ML  SOLN COMPARISON:  10/20/2018 CT abdomen and pelvis. FINDINGS: Lower chest: No acute abnormality. Hepatobiliary: Hepatic steatosis. No focal liver abnormality is seen. No gallstones, gallbladder wall thickening, or biliary dilatation. Pancreas: Unremarkable. No pancreatic ductal dilatation or surrounding inflammatory changes. Spleen: Normal in size without focal abnormality. Adrenals/Urinary Tract: Adrenal glands are unremarkable. Bilateral nonobstructive nephrolithiasis. No hydronephrosis. Mild hypoenhancement of left kidney in comparison with the right kidney. Normal appearance of the bladder. Stomach/Bowel: Stomach is within normal limits. Stable prominent appendix stump without surrounding inflammatory changes. No evidence of bowel wall thickening, distention, or inflammatory changes. Diverticulosis, no findings of acute diverticulitis. Vascular/Lymphatic: Aortic atherosclerosis. No enlarged abdominal or pelvic lymph nodes. Reproductive: Uterus and bilateral adnexa are unremarkable. IUD well seated in uterine fundus. Other: No abdominal wall hernia or abnormality. No abdominopelvic ascites. Musculoskeletal: No fracture is seen. Advanced spondylosis and mild dextrocurvature of the lumbar spine. Flowing anterior ossification of lower thoracic spine compatible with DISH. IMPRESSION: 1. Mild hypoenhancement of left kidney in comparison with the right kidney. Findings may represent a recently passed stone or possibly infection of the renal collecting system/pyelonephritis. 2. Bilateral nonobstructive  nephrolithiasis. 3. Hepatic steatosis. 4. Diverticulosis without findings of acute diverticulitis. 5. Aortic Atherosclerosis (ICD10-I70.0). Electronically Signed   By: LKristine GarbeM.D.   On: 11/21/2018 20:55   Dg Chest Port 1 View  Result Date: 11/21/2018 CLINICAL DATA:  Confusion, vomiting, fatigue EXAM: PORTABLE CHEST 1 VIEW COMPARISON:  10/20/2018 FINDINGS: Cardiomegaly. Both lungs are clear. The visualized skeletal structures are unremarkable. IMPRESSION: Cardiomegaly without acute abnormality of the lungs in AP portable projection. Electronically Signed   By: AEddie CandleM.D.   On: 11/21/2018 17:02        Scheduled Meds: . apixaban  5 mg Oral BID  . calcitRIOL  2 mcg Oral Daily  . calcium carbonate  2,000 mg Oral BID  . cyanocobalamin  1,000 mcg Subcutaneous Q30 days  . diclofenac sodium  2 g Topical QID  . diltiazem  240 mg Oral Daily  . levothyroxine  350 mcg Oral Q0600  . loratadine  10 mg Oral QPM  . mometasone-formoterol  2 puff Inhalation BID  . montelukast  10 mg Oral QHS  . mupirocin ointment  1 application Nasal BID  . nystatin  5 mL Mouth/Throat QID  . nystatin cream  1 application Topical BID  . pantoprazole  40 mg Oral Daily  . triamcinolone   Topical QID  . Vitamin D (Ergocalciferol)  50,000 Units Oral Q M,W,F   Continuous Infusions: . aztreonam 2 g (11/23/18 0836)  . lactated ringers 125 mL/hr at 11/23/18 0627  . potassium chloride 10 mEq (11/23/18 0853)     LOS: 2 days   The patient is critically ill with multiple organ systems failure and requires high complexity decision making for assessment and support, frequent evaluation and titration of therapies, application of advanced monitoring technologies and extensive interpretation of multiple databases. Critical Care Time  devoted to patient care services described in this note  Time spent: 40 minutes     Sophia Cubero, Geraldo Docker, MD [facility] Pager [phone]  If 7PM-7AM, please  contact night-coverage [email or web address] Password [email or web address] 11/23/2018, 8:57 AM

## 2018-11-23 NOTE — Progress Notes (Signed)
Pt refused PO potassium pills for pt stated "It does not work."

## 2018-11-23 NOTE — Progress Notes (Addendum)
CRITICAL VALUE ALERT  Critical Value:  K+ = 2.6  Date & Time Notied:  11/23/2018 0513  Provider Notified: [name], NP   Orders Received/Actions taken: PO K+, and 3 runs of IV K+

## 2018-11-23 NOTE — Progress Notes (Signed)
Patient no showed due to being hospitalized. No charge.

## 2018-11-24 ENCOUNTER — Encounter (HOSPITAL_COMMUNITY): Payer: Self-pay | Admitting: General Practice

## 2018-11-24 DIAGNOSIS — N12 Tubulo-interstitial nephritis, not specified as acute or chronic: Secondary | ICD-10-CM

## 2018-11-24 HISTORY — DX: Tubulo-interstitial nephritis, not specified as acute or chronic: N12

## 2018-11-24 LAB — CBC
HCT: 29.3 % — ABNORMAL LOW (ref 36.0–46.0)
Hemoglobin: 8.7 g/dL — ABNORMAL LOW (ref 12.0–15.0)
MCH: 26 pg (ref 26.0–34.0)
MCHC: 29.7 g/dL — ABNORMAL LOW (ref 30.0–36.0)
MCV: 87.7 fL (ref 80.0–100.0)
Platelets: UNDETERMINED 10*3/uL (ref 150–400)
RBC: 3.34 MIL/uL — ABNORMAL LOW (ref 3.87–5.11)
RDW: 17.2 % — ABNORMAL HIGH (ref 11.5–15.5)
WBC: 6.6 10*3/uL (ref 4.0–10.5)
nRBC: 0 % (ref 0.0–0.2)

## 2018-11-24 LAB — BLOOD CULTURE ID PANEL (REFLEXED)

## 2018-11-24 LAB — BASIC METABOLIC PANEL
Anion gap: 9 (ref 5–15)
BUN: 11 mg/dL (ref 8–23)
CO2: 26 mmol/L (ref 22–32)
Calcium: 8.1 mg/dL — ABNORMAL LOW (ref 8.9–10.3)
Chloride: 99 mmol/L (ref 98–111)
Creatinine, Ser: 0.74 mg/dL (ref 0.44–1.00)
GFR calc Af Amer: >60 mL/min (ref >60)
GFR calc non Af Amer: >60 mL/min (ref >60)
Glucose, Bld: 93 mg/dL (ref 70–99)
Potassium: 3.4 mmol/L — ABNORMAL LOW (ref 3.5–5.1)
Sodium: 134 mmol/L — ABNORMAL LOW (ref 135–145)

## 2018-11-24 LAB — CULTURE, BLOOD (ROUTINE X 2): Special Requests: ADEQUATE

## 2018-11-24 LAB — URINE CULTURE: Culture: >=100000 — AB

## 2018-11-24 LAB — MAGNESIUM: Magnesium: 2.1 mg/dL (ref 1.7–2.4)

## 2018-11-24 MED ORDER — POTASSIUM CHLORIDE CRYS ER 20 MEQ PO TBCR
40.0000 meq | EXTENDED_RELEASE_TABLET | Freq: Once | ORAL | Status: AC
  Administered 2018-11-24: 40 meq via ORAL
  Filled 2018-11-24: qty 2

## 2018-11-24 MED ORDER — DILTIAZEM HCL ER COATED BEADS 120 MG PO CP24
120.0000 mg | ORAL_CAPSULE | Freq: Every day | ORAL | Status: DC
  Administered 2018-11-25: 120 mg via ORAL
  Filled 2018-11-24: qty 1

## 2018-11-24 MED ORDER — FUROSEMIDE 40 MG PO TABS
40.0000 mg | ORAL_TABLET | Freq: Two times a day (BID) | ORAL | Status: DC
  Administered 2018-11-24 – 2018-11-25 (×2): 40 mg via ORAL
  Filled 2018-11-24 (×2): qty 1

## 2018-11-24 NOTE — Progress Notes (Signed)
PHARMACY - PHYSICIAN COMMUNICATION CRITICAL VALUE ALERT - BLOOD CULTURE IDENTIFICATION (BCID)  Nonie JHANIA ETHERINGTON is an 63 y.o. female who presented to [facility] on 11/21/2018 with a chief complaint of pyelonephritis.   Assessment:  Patient is currently being treated with aztreonam for E coli bacteremia in 1/2 sets. Now with methicillin sensitive coag negative staph in  1/2 sets.  This likely represents a contaminant. Patient WBC count and fever curve is trending down.   Name of physician (or Provider) Contacted: Antonieta Pert   Current antibiotics: Aztreonam  Changes to prescribed antibiotics recommended:  None needed based on new BCID.  Consider oral step-down for E. Coli bacteremia soon  Results for orders placed or performed during the hospital encounter of 11/21/18  Blood Culture ID Panel (Reflexed) (Collected: 11/21/2018  4:27 PM)  Result Value Ref Range   Enterococcus species NOT DETECTED NOT DETECTED   Listeria monocytogenes NOT DETECTED NOT DETECTED   Staphylococcus species DETECTED (A) NOT DETECTED   Staphylococcus aureus (BCID) NOT DETECTED NOT DETECTED   Methicillin resistance NOT DETECTED NOT DETECTED   Streptococcus species NOT DETECTED NOT DETECTED   Streptococcus agalactiae NOT DETECTED NOT DETECTED   Streptococcus pneumoniae NOT DETECTED NOT DETECTED   Streptococcus pyogenes NOT DETECTED NOT DETECTED   Acinetobacter baumannii NOT DETECTED NOT DETECTED   Enterobacteriaceae species NOT DETECTED NOT DETECTED   Enterobacter cloacae complex NOT DETECTED NOT DETECTED   Escherichia coli NOT DETECTED NOT DETECTED   Klebsiella oxytoca NOT DETECTED NOT DETECTED   Klebsiella pneumoniae NOT DETECTED NOT DETECTED   Proteus species NOT DETECTED NOT DETECTED   Serratia marcescens NOT DETECTED NOT DETECTED   Haemophilus influenzae NOT DETECTED NOT DETECTED   Neisseria meningitidis NOT DETECTED NOT DETECTED   Pseudomonas aeruginosa NOT DETECTED NOT DETECTED   Candida albicans NOT  DETECTED NOT DETECTED   Candida glabrata NOT DETECTED NOT DETECTED   Candida krusei NOT DETECTED NOT DETECTED   Candida parapsilosis NOT DETECTED NOT DETECTED   Candida tropicalis NOT DETECTED NOT DETECTED    Jimmy Footman, PharmD, BCPS, BCIDP Infectious Diseases Clinical Pharmacist Phone: [phone] 11/24/2018  8:27 AM

## 2018-11-24 NOTE — Progress Notes (Signed)
Patient refused CPAP tonight 

## 2018-11-24 NOTE — Progress Notes (Addendum)
PROGRESS NOTE    [name]  PTW:[id number] DOB: 01/12/1956 DOA: 11/21/2018 PCP: Debbrah Alar, NP   Brief Narrative: Per HPI :63 y.o.WF with hx of fibromyalgia, thoracic spondylosis, morbid obesity, thyroid cancer s/p thyroidectomy, hypothyroidism, hypoparathyroidism, HTN, fibrillation on eliquis, aortic atherosclerosis, iron deficiency anemia, COPD, nephrolithiasis, fatty liver, chronic edema,recent E. coli bacteremiaOn Oct 21, 2018,who was brought in for evaluation of weakness, some fever and chills x 2 days. Last discharge 5/31.  Patient was seen in the ER blood pressure soft hypotensive 93/52, temperature 100.1 pulse 115 potassium 2.4 WBC count 18K lactic acid is up 2.7 urinalysis was positive and was admitted.  CT abdomen pelvis:showed Abnormal findings of the left kidney probably reflecting pyelonephritis and bilateral nonobstructing nephrolithiasis.Patient has received fosfomycin dose in the ER due to allergies. She was admitted for further treatment.  She is being treated with IV aztreonam she has multiple drug allergies.  Subjective: Overnight no fever,WBC has improved.  Urine culture and blood growing E. Coli. Stillc/oleftflankpai.novomittingtoday.atesome.stillvery weak.c/o being swollen more.  Assessment & Plan:  Pyelonephritis e coli, recurrent episode.  With sepsis present on admission.  Patient is clinically improving.  Continue IV aztreonam she has multiple drug allergy.ctshowingleftrenalinvolvement andpt has left more than rt flankpain. Her recurrent UTI likely from associated nephrolithiasis- she does have urology per brother and will advise her to se them soon and see if stone needs to be addressed. Currently no obstructive stone.  E. coli bacteremia from #1 continue antibiotics as above.  Chronic diastolic CHF patient on Lasix/HCTZ at home currently on hold due to her sepsis/hypotension.  BP still soft in low 100 , resume her diuretics at lower dose given  fluid overload.  Weight More or less stable at 291. Echocardiogram showed mild diastolic CHF.  Hypothyroidism:cont home meds  OBESITY, MORBID : BMI 49.7.  Advised weight loss, healthy lifestyle.  Essential hypertension: BP soft on Cardizem 240 mg.  Can down titrate Cardizem, starting Lasix.  Monitor blood pressure overnight.  GERD:cont ppi  Atrial fibrillation: Currently in normal sinus rhythm.  On Eliquis.  Hypokalemia: Potassium 3.4.  Better.  Replete  Nausea and vomiting in the setting of pyelonephritis continue supportive care.  COPD/OSA continue bedtime CPAP per respiratory.  Postoperative hypothyroidism/hypoparathyroidism : intact pth low at 8, tsh 0.5 nl.  Continue her Synthroid.  Calcium is stable.  Anemia appears chronic hemoglobin more or less stable 8 to 10 g.  Monitor.  DVT prophylaxis: eliquis Code Status: full Family Communication:  POC discussed with patient Disposition Plan: remains inpatient pending clinical improvement. Anticipated d/c 24- 48 hrs addendnum: I updated patient's brother over the phone and answered all questions.  Consultants:  None  Procedures:  Antimicrobials: Anti-infectives (From admission, onward)   Start     Dose/Rate Route Frequency Ordered Stop   11/22/18 1600  aztreonam (AZACTAM) 2 g in sodium chloride 0.9 % 100 mL IVPB     2 g 200 mL/hr over 30 Minutes Intravenous Every 8 hours 11/22/18 1548     11/22/18 0800  aztreonam (AZACTAM) 1 g in sodium chloride 0.9 % 100 mL IVPB  Status:  Discontinued     1 g 200 mL/hr over 30 Minutes Intravenous Every 8 hours 11/22/18 0018 11/22/18 1548   11/22/18 0030  aztreonam (AZACTAM) 1 g in sodium chloride 0.9 % 100 mL IVPB     1 g 200 mL/hr over 30 Minutes Intravenous  Once 11/22/18 0018 11/22/18 0205       Objective: Vitals:   11/24/18  0500 11/24/18 0528 11/24/18 0924 11/24/18 1309  BP:  (!) 127/45  (!) 104/58  Pulse:  79  65  Resp:  18  18  Temp:  99.1 F (37.3 C)  98.3 F (36.8 C)   TempSrc:  Oral  Oral  SpO2:  95% 96% 96%  Weight: 131.5 kg     Height:        Intake/Output Summary (Last 24 hours) at 11/24/2018 1327 Last data filed at 11/24/2018 0820 Gross per 24 hour  Intake 460 ml  Output 1400 ml  Net -940 ml   Filed Weights   11/21/18 2358 11/24/18 0500  Weight: 131.7 kg 131.5 kg   Weight change:   Body mass index is 49.76 kg/m.  Intake/Output from previous day: 06/30 0701 - 07/01 0700 In: 38 [P.O.:360; IV Piggyback:100] Out: 1100 [Urine:1100] Intake/Output this shift: Total I/O In: -  Out: 300 [Urine:300]  Examination:  General exam: Appears calm and comfortable,Not in distress, older fore the age HEENT:PERRL,Oral mucosa moist, Ear/Nose normal on gross exam Respiratory system: Bilateral equal air entry, normal vesicular breath sounds, no wheezes or crackles  Cardiovascular system: S1 & S2 heard,No JVD, murmurs. Gastrointestinal system: Abdomen is  soft, non tender, non distended, BS +  Nervous System:Alert and oriented. No focal neurological deficits/moving extremities, sensation intact. Extremities: b/l leg edema, no clubbing, distal peripheral pulses palpable. Skin: No rashes, lesions, no icterus MSK: Normal muscle bulk,tone ,power  Medications:  Scheduled Meds: . apixaban  5 mg Oral BID  . calcitRIOL  2 mcg Oral Daily  . calcium carbonate  2,000 mg Oral BID  . cyanocobalamin  1,000 mcg Subcutaneous Q30 days  . diclofenac sodium  2 g Topical QID  . diltiazem  240 mg Oral Daily  . levothyroxine  350 mcg Oral Q0600  . loratadine  10 mg Oral QPM  . mometasone-formoterol  2 puff Inhalation BID  . montelukast  10 mg Oral QHS  . mupirocin ointment  1 application Nasal BID  . nystatin  5 mL Mouth/Throat QID  . nystatin cream  1 application Topical BID  . pantoprazole  40 mg Oral Daily  . triamcinolone   Topical QID  . Vitamin D (Ergocalciferol)  50,000 Units Oral Q M,W,F   Continuous Infusions: . aztreonam 2 g (11/24/18 0836)    Data  Reviewed: I have personally reviewed following labs and imaging studies  CBC: Recent Labs  Lab 11/21/18 1742 11/22/18 0306 11/23/18 0334 11/24/18 0146  WBC 18.3* 21.0* 12.3* 6.6  NEUTROABS 16.3*  --   --   --   HGB 10.8* 10.4* 9.0* 8.7*  HCT 35.6* 34.6* 30.0* 29.3*  MCV 87.5 87.2 86.7 87.7  PLT 186 157 PLATELET CLUMPS NOTED ON SMEAR, UNABLE TO ESTIMATE PLATELET CLUMPS NOTED ON SMEAR, UNABLE TO ESTIMATE   Basic Metabolic Panel: Recent Labs  Lab 11/21/18 1742 11/21/18 2324 11/22/18 0545 11/22/18 0855 11/22/18 1558 11/23/18 0334 11/24/18 0146  NA 134*  --  133*  --   --  132* 134*  K 2.4*  --  2.7*  --  2.8* 2.6* 3.4*  CL 91*  --  94*  --   --  95* 99  CO2 26  --  26  --   --  27 26  GLUCOSE 117*  --  123*  --   --  107* 93  BUN 25*  --  22  --   --  14 11  CREATININE 1.02*  --  0.98  --   --  0.68 0.74  CALCIUM 8.5*  --  7.7*  --   --  7.7* 8.1*  MG  --  1.9  --  1.9 1.8 1.8 2.1  PHOS  --   --   --  4.5  --   --   --    GFR: Estimated Creatinine Clearance: 99.6 mL/min (by C-G formula based on SCr of 0.74 mg/dL). Liver Function Tests: Recent Labs  Lab 11/21/18 1742 11/22/18 0545  AST 25 20  ALT 34 28  ALKPHOS 116 98  BILITOT 1.2 1.1  PROT 7.4 6.1*  ALBUMIN 3.5 2.8*   No results for input(s): LIPASE, AMYLASE in the last 168 hours. Recent Labs  Lab 11/21/18 1740  AMMONIA 15   Coagulation Profile: No results for input(s): INR, PROTIME in the last 168 hours. Cardiac Enzymes: No results for input(s): CKTOTAL, CKMB, CKMBINDEX, TROPONINI in the last 168 hours. BNP (last 3 results) No results for input(s): PROBNP in the last 8760 hours. HbA1C: No results for input(s): HGBA1C in the last 72 hours. CBG: No results for input(s): GLUCAP in the last 168 hours. Lipid Profile: No results for input(s): CHOL, HDL, LDLCALC, TRIG, CHOLHDL, LDLDIRECT in the last 72 hours. Thyroid Function Tests: Recent Labs    11/22/18 1558  TSH 0.582   Anemia Panel: No results  for input(s): VITAMINB12, FOLATE, FERRITIN, TIBC, IRON, RETICCTPCT in the last 72 hours. Sepsis Labs: Recent Labs  Lab 11/21/18 1740 11/21/18 2324 11/22/18 0855 11/23/18 1030  LATICACIDVEN 2.1* 2.7* 2.4* 1.3    Recent Results (from the past 240 hour(s))  Blood Culture (routine x 2)     Status: None (Preliminary result)   Collection Time: 11/21/18  4:27 PM   Specimen: BLOOD LEFT FOREARM  Result Value Ref Range Status   Specimen Description BLOOD LEFT FOREARM  Final   Special Requests   Final    AEROBIC BOTTLE ONLY Blood Culture results may not be optimal due to an inadequate volume of blood received in culture bottles   Culture  Setup Time   Final    GRAM POSITIVE COCCI AEROBIC BOTTLE ONLY CRITICAL RESULT CALLED TO, READ BACK BY AND VERIFIED WITH: PAHARMD E [place] [id number] AT 823 AM BY CM Performed at [facility], [place] [address], [place], [place] [address]    Culture GRAM POSITIVE COCCI  Final   Report Status PENDING  Incomplete  Blood Culture ID Panel (Reflexed)     Status: Abnormal   Collection Time: 11/21/18  4:27 PM  Result Value Ref Range Status   Enterococcus species NOT DETECTED NOT DETECTED Final   Listeria monocytogenes NOT DETECTED NOT DETECTED Final   Staphylococcus species DETECTED (A) NOT DETECTED Final    Comment: Methicillin (oxacillin) susceptible coagulase negative staphylococcus. Possible blood culture contaminant (unless isolated from more than one blood culture draw or clinical case suggests pathogenicity). No antibiotic treatment is indicated for blood  culture contaminants. CRITICAL RESULT CALLED TO, READ BACK BY AND VERIFIED WITH: PHARMD E [place] [id number] AT 23 AM BY CM    Staphylococcus aureus (BCID) NOT DETECTED NOT DETECTED Final   Methicillin resistance NOT DETECTED NOT DETECTED Final   Streptococcus species NOT DETECTED NOT DETECTED Final   Streptococcus agalactiae NOT DETECTED NOT DETECTED Final   Streptococcus pneumoniae NOT DETECTED  NOT DETECTED Final   Streptococcus pyogenes NOT DETECTED NOT DETECTED Final   Acinetobacter baumannii NOT DETECTED NOT DETECTED Final   Enterobacteriaceae species NOT DETECTED NOT DETECTED Final   Enterobacter cloacae  complex NOT DETECTED NOT DETECTED Final   Escherichia coli NOT DETECTED NOT DETECTED Final   Klebsiella oxytoca NOT DETECTED NOT DETECTED Final   Klebsiella pneumoniae NOT DETECTED NOT DETECTED Final   Proteus species NOT DETECTED NOT DETECTED Final   Serratia marcescens NOT DETECTED NOT DETECTED Final   Haemophilus influenzae NOT DETECTED NOT DETECTED Final   Neisseria meningitidis NOT DETECTED NOT DETECTED Final   Pseudomonas aeruginosa NOT DETECTED NOT DETECTED Final   Candida albicans NOT DETECTED NOT DETECTED Final   Candida glabrata NOT DETECTED NOT DETECTED Final   Candida krusei NOT DETECTED NOT DETECTED Final   Candida parapsilosis NOT DETECTED NOT DETECTED Final   Candida tropicalis NOT DETECTED NOT DETECTED Final    Comment: Performed at [facility], [place] [address] [place], [place] [address]  Blood Culture (routine x 2)     Status: Abnormal   Collection Time: 11/21/18  4:30 PM   Specimen: BLOOD  Result Value Ref Range Status   Specimen Description BLOOD LEFT ANTECUBITAL  Final   Special Requests   Final    BOTTLES DRAWN AEROBIC AND ANAEROBIC Blood Culture adequate volume   Culture  Setup Time   Final    GRAM NEGATIVE RODS AEROBIC BOTTLE ONLY CRITICAL RESULT CALLED TO, READ BACK BY AND VERIFIED WITH: Hyman Bible PharmD 15:40 11/22/18 ([email or web address]) Performed at [facility], [place] [address], [place], [place] [address]    Culture ESCHERICHIA COLI (A)  Final   Report Status 11/24/2018 FINAL  Final   Organism ID, Bacteria ESCHERICHIA COLI  Final      Susceptibility   Escherichia coli - MIC*    AMPICILLIN 4 SENSITIVE Sensitive     CEFAZOLIN <=4 SENSITIVE Sensitive     CEFEPIME <=1 SENSITIVE Sensitive     CEFTAZIDIME <=1 SENSITIVE Sensitive      CEFTRIAXONE <=1 SENSITIVE Sensitive     CIPROFLOXACIN <=0.25 SENSITIVE Sensitive     GENTAMICIN <=1 SENSITIVE Sensitive     IMIPENEM <=0.25 SENSITIVE Sensitive     TRIMETH/SULFA <=20 SENSITIVE Sensitive     AMPICILLIN/SULBACTAM <=2 SENSITIVE Sensitive     PIP/TAZO <=4 SENSITIVE Sensitive     Extended ESBL NEGATIVE Sensitive     * ESCHERICHIA COLI  Blood Culture ID Panel (Reflexed)     Status: Abnormal   Collection Time: 11/21/18  4:30 PM  Result Value Ref Range Status   Enterococcus species NOT DETECTED NOT DETECTED Final   Listeria monocytogenes NOT DETECTED NOT DETECTED Final   Staphylococcus species NOT DETECTED NOT DETECTED Final   Staphylococcus aureus (BCID) NOT DETECTED NOT DETECTED Final   Streptococcus species NOT DETECTED NOT DETECTED Final   Streptococcus agalactiae NOT DETECTED NOT DETECTED Final   Streptococcus pneumoniae NOT DETECTED NOT DETECTED Final   Streptococcus pyogenes NOT DETECTED NOT DETECTED Final   Acinetobacter baumannii NOT DETECTED NOT DETECTED Final   Enterobacteriaceae species DETECTED (A) NOT DETECTED Final    Comment: Enterobacteriaceae represent a large family of gram-negative bacteria, not a single organism. CRITICAL RESULT CALLED TO, READ BACK BY AND VERIFIED WITH: Hyman Bible PharmD 15:40 11/22/18 (wilsonm)    Enterobacter cloacae complex NOT DETECTED NOT DETECTED Final   Escherichia coli DETECTED (A) NOT DETECTED Final    Comment: CRITICAL RESULT CALLED TO, READ BACK BY AND VERIFIED WITH: Hyman Bible PharmD 15:40 11/22/18 (wilsonm)    Klebsiella oxytoca NOT DETECTED NOT DETECTED Final   Klebsiella pneumoniae NOT DETECTED NOT DETECTED Final   Proteus species NOT DETECTED  NOT DETECTED Final   Serratia marcescens NOT DETECTED NOT DETECTED Final   Carbapenem resistance NOT DETECTED NOT DETECTED Final   Haemophilus influenzae NOT DETECTED NOT DETECTED Final   Neisseria meningitidis NOT DETECTED NOT DETECTED Final   Pseudomonas aeruginosa NOT DETECTED  NOT DETECTED Final   Candida albicans NOT DETECTED NOT DETECTED Final   Candida glabrata NOT DETECTED NOT DETECTED Final   Candida krusei NOT DETECTED NOT DETECTED Final   Candida parapsilosis NOT DETECTED NOT DETECTED Final   Candida tropicalis NOT DETECTED NOT DETECTED Final    Comment: Performed at [facility], [place] [address] [place], [place] [address]  SARS Coronavirus 2 (CEPHEID- Performed in [place] hospital lab), Hosp Order     Status: None   Collection Time: 11/21/18  5:18 PM   Specimen: Nasopharyngeal Swab  Result Value Ref Range Status   SARS Coronavirus 2 NEGATIVE NEGATIVE Final    Comment: (NOTE) If result is NEGATIVE SARS-CoV-2 target nucleic acids are NOT DETECTED. The SARS-CoV-2 RNA is generally detectable in upper and lower  respiratory specimens during the acute phase of infection. The lowest  concentration of SARS-CoV-2 viral copies this assay can detect is 250  copies / mL. A negative result does not preclude SARS-CoV-2 infection  and should not be used as the sole basis for treatment or other  patient management decisions.  A negative result may occur with  improper specimen collection / handling, submission of specimen other  than nasopharyngeal swab, presence of viral mutation(s) within the  areas targeted by this assay, and inadequate number of viral copies  (<250 copies / mL). A negative result must be combined with clinical  observations, patient history, and epidemiological information. If result is POSITIVE SARS-CoV-2 target nucleic acids are DETECTED. The SARS-CoV-2 RNA is generally detectable in upper and lower  respiratory specimens dur ing the acute phase of infection.  Positive  results are indicative of active infection with SARS-CoV-2.  Clinical  correlation with patient history and other diagnostic information is  necessary to determine patient infection status.  Positive results do  not rule out bacterial infection or co-infection with  other viruses. If result is PRESUMPTIVE POSTIVE SARS-CoV-2 nucleic acids MAY BE PRESENT.   A presumptive positive result was obtained on the submitted specimen  and confirmed on repeat testing.  While 2019 novel coronavirus  (SARS-CoV-2) nucleic acids may be present in the submitted sample  additional confirmatory testing may be necessary for epidemiological  and / or clinical management purposes  to differentiate between  SARS-CoV-2 and other Sarbecovirus currently known to infect humans.  If clinically indicated additional testing with an alternate test  methodology [phone]) is advised. The SARS-CoV-2 RNA is generally  detectable in upper and lower respiratory sp ecimens during the acute  phase of infection. The expected result is Negative. Fact Sheet for Patients:  StrictlyIdeas.no Fact Sheet for Healthcare Providers: BankingDealers.co.za This test is not yet approved or cleared by the [place] FDA and has been authorized for detection and/or diagnosis of SARS-CoV-2 by FDA under an Emergency Use Authorization (EUA).  This EUA will remain in effect (meaning this test can be used) for the duration of the COVID-19 declaration under Section 564(b)(1) of the Act, 21 U.S.C. section 360bbb-3(b)(1), unless the authorization is terminated or revoked sooner. Performed at [facility], [place] [address] [place], [place] [address]   Urine culture     Status: Abnormal   Collection Time: 11/21/18  9:48 PM  Specimen: Urine, Random  Result Value Ref Range Status   Specimen Description URINE, RANDOM  Final   Special Requests   Final    NONE Performed at [facility], [address], [place], [place] [address]    Culture >=100,000 COLONIES/mL ESCHERICHIA COLI (A)  Final   Report Status 11/24/2018 FINAL  Final   Organism ID, Bacteria ESCHERICHIA COLI (A)  Final      Susceptibility   Escherichia coli - MIC*    AMPICILLIN <=2  SENSITIVE Sensitive     CEFAZOLIN <=4 SENSITIVE Sensitive     CEFTRIAXONE <=1 SENSITIVE Sensitive     CIPROFLOXACIN <=0.25 SENSITIVE Sensitive     GENTAMICIN <=1 SENSITIVE Sensitive     IMIPENEM <=0.25 SENSITIVE Sensitive     NITROFURANTOIN <=16 SENSITIVE Sensitive     TRIMETH/SULFA <=20 SENSITIVE Sensitive     AMPICILLIN/SULBACTAM <=2 SENSITIVE Sensitive     PIP/TAZO <=4 SENSITIVE Sensitive     Extended ESBL NEGATIVE Sensitive     * >=100,000 COLONIES/mL ESCHERICHIA COLI  MRSA PCR Screening     Status: Abnormal   Collection Time: 11/22/18  2:15 AM   Specimen: Nasal Mucosa; Nasopharyngeal  Result Value Ref Range Status   MRSA by PCR POSITIVE (A) NEGATIVE Final    Comment:        The GeneXpert MRSA Assay (FDA approved for NASAL specimens only), is one component of a comprehensive MRSA colonization surveillance program. It is not intended to diagnose MRSA infection nor to guide or monitor treatment for MRSA infections. RESULT CALLED TO, READ BACK BY AND VERIFIED WITH: IRISH,N RN 11/22/2018 AT 0327 SKEEN,P Performed at [facility], [place] [address] [place], [place] [address]       Radiology Studies: No results found.    LOS: 3 days   Time spent: More than 50% of that time was spent in counseling and/or coordination of care.  Antonieta Pert, MD [facility]  11/24/2018, 1:27 PM

## 2018-11-25 ENCOUNTER — Telehealth: Payer: Self-pay

## 2018-11-25 LAB — CULTURE, BLOOD (ROUTINE X 2)

## 2018-11-25 LAB — CBC
HCT: 31 % — ABNORMAL LOW (ref 36.0–46.0)
Hemoglobin: 9.4 g/dL — ABNORMAL LOW (ref 12.0–15.0)
MCH: 26.4 pg (ref 26.0–34.0)
MCHC: 30.3 g/dL (ref 30.0–36.0)
MCV: 87.1 fL (ref 80.0–100.0)
Platelets: UNDETERMINED 10*3/uL (ref 150–400)
RBC: 3.56 MIL/uL — ABNORMAL LOW (ref 3.87–5.11)
RDW: 17.2 % — ABNORMAL HIGH (ref 11.5–15.5)
WBC: 5.7 10*3/uL (ref 4.0–10.5)
nRBC: 0 % (ref 0.0–0.2)

## 2018-11-25 LAB — BASIC METABOLIC PANEL
Anion gap: 10 (ref 5–15)
BUN: 8 mg/dL (ref 8–23)
CO2: 26 mmol/L (ref 22–32)
Calcium: 7.9 mg/dL — ABNORMAL LOW (ref 8.9–10.3)
Chloride: 99 mmol/L (ref 98–111)
Creatinine, Ser: 0.75 mg/dL (ref 0.44–1.00)
GFR calc Af Amer: >60 mL/min (ref >60)
GFR calc non Af Amer: >60 mL/min (ref >60)
Glucose, Bld: 98 mg/dL (ref 70–99)
Potassium: 3.4 mmol/L — ABNORMAL LOW (ref 3.5–5.1)
Sodium: 135 mmol/L (ref 135–145)

## 2018-11-25 LAB — MAGNESIUM: Magnesium: 2.1 mg/dL (ref 1.7–2.4)

## 2018-11-25 MED ORDER — POTASSIUM CHLORIDE CRYS ER 20 MEQ PO TBCR
40.0000 meq | EXTENDED_RELEASE_TABLET | Freq: Once | ORAL | Status: AC
  Administered 2018-11-25: 40 meq via ORAL
  Filled 2018-11-25: qty 2

## 2018-11-25 MED ORDER — SULFAMETHOXAZOLE-TRIMETHOPRIM 800-160 MG PO TABS: 1.0000 | ORAL_TABLET | Freq: Two times a day (BID) | ORAL | 0 refills | Status: AC

## 2018-11-25 MED ORDER — DILTIAZEM HCL ER COATED BEADS 120 MG PO CP24: 120.0000 mg | ORAL_CAPSULE | Freq: Every day | ORAL | 0 refills | Status: DC

## 2018-11-25 NOTE — Consult Note (Signed)
   [facility] Inpatient Consult   11/25/2018  [name] 1956/04/15 [id number]    Patientchecked ifpotential [facility] Management services are neededunder her [employer] plan. Review of patient's medical record reveals patienthad a 30 day readmission with 2 hospitalizations in the past 6 months; also with 30% extreme high risk score for unplanned readmissions.  Per MD brief narrative dated 11/23/18, shows as follows:  63 y.o.WF PMHx fibromyalgia, thoracic spondylosis, morbid obesity, thyroid cancer s/p thyroidectomy, hypothyroidism, hypoparathyroidism, HTN, fibrillation (on Eliquis), aortic atherosclerosis, iron deficiency anemia, COPD, nephrolithiasis, fatty liver, chronic edema  Recent E. coli bacteremiaOn Oct 21, 2018,who was brought in for evaluation of weakness some fever and chills. Patient has been fine since she left the hospital on May 31. In the past 2 days however, she has progressively gotten weak tired and developed some fever. Patient also has some mild confusion. 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 She also has some nausea with 6 episodes of vomiting yesterday. Denied any diarrhea denied any abdominal pain. Patient noted strong smell of her urine and decreased volume. No significant pain. She came to the ER where she is evaluated and found to have another episodes of urinary tract infection. She did have left back pain which turned out to be CVA tenderness. At this point she is being readmitted with acute pyelonephritis  Primary Care Provider isMelissa O'Sullivan with [place] [facility], listed to provide transition of care follow-up.  Chart review shows that patient will transition to home.  Called and spoke with patient in her room. HIPAA verified.  Patient reports having no issues or needs with medications/ pharmacy ([facility], [facility], [facility]),  transportation (self or family members living closeby- if needed) and also family members nearby provide assistance for her in case she  needs it.  Currently, she denies any needs [facility] Care Management services. Patient mentioned preference for [facility] home health if needed/ recommended. Will follow-up with [facility] post hospitalization.  Will follow-up patient with [facility] calls after discharge to monitor recovery.  Please place a [facility] Care Management consult as appropriate.    For questions and referrals, please call:   Edwena Felty A. Jaymien Landin, BSN, RN-BC [facility] Liaison Cell: [phone]

## 2018-11-25 NOTE — Discharge Summary (Signed)
Physician Discharge Summary  [name] EXH:[id number] DOB: [birth year] DOA: 11/21/2018  PCP: Debbrah Alar, NP  Admit date: 11/21/2018 Discharge date: 11/25/2018  Admitted From: Home Disposition: Home  Recommendations for Outpatient Follow-up:  1. Follow up with PCP in 1-2 weeks 2. Please obtain BMP/CBC in one week 3. Please follow up on the following pending results:  Home Health: None Equipment/Devices: None  Discharge Condition: Stable CODE STATUS:DNR   Diet recommendation: Cardiac diet  Brief/Interim Summary: :63 y.o.WF with hx of fibromyalgia, thoracic spondylosis, morbid obesity, thyroid cancer s/p thyroidectomy, hypothyroidism, hypoparathyroidism, HTN, fibrillation on eliquis, aortic atherosclerosis, iron deficiency anemia, COPD, nephrolithiasis, fatty liver, chronic edema,recent E. coli bacteremiaOn Oct 21, 2018,who was brought in for evaluation of weakness, some fever and chills x 2 days. Last discharge 5/31.  Patient was seen in the ER blood pressure soft hypotensive 93/52, temperature 100.1 pulse 115 potassium 2.4 WBC count 18K lactic acid is up 2.7 urinalysis was positive and was admitted.  CT abdomen pelvis:showed Abnormal findings of the left kidney probably reflecting pyelonephritis and bilateral nonobstructing nephrolithiasis.Patient has received fosfomycin dose in the ER due to allergies. She was admitted for further treatment.  She is being treated with IV aztreonam she has multiple drug allergies. Patient is treated with IV antibiotics and symptomatically.  She said 4 days of aztreonam.  Her urine culture and blood culture grew pansensitive E. Coli. Discussed Dr. Megan Salon from infectious recommends to continue total 7 days of antibiotics which she has 3 more days left and will be discharged on oral Bactrim.  Advised to follow-up with her urologist regarding her nephrolithiasis. Discharge Diagnoses:  Principal Problem:   Pyelonephritis Active Problems:    Hypothyroidism   OBESITY, MORBID   Essential hypertension   GERD   Atrial fibrillation (HCC)   Hypokalemia  Pyelonephritis e coli, second episode.  Discharging home on 3 more days of Bactrim and she will follow-up with her, urologist. E. coli bacteremia from #1 continue antibiotics as above.  Chronic diastolic CHF patient on Lasix/HCTZ : We have cut down her Coreg, advised to resume home meds.    Hypothyroidism:cont home meds  OBESITY, MORBID : BMI 49.7.  Advised weight loss, healthy lifestyle.  Essential hypertension: BP soft SO cut down the Coreg, continue rest of the home meds.   GERD:cont ppi  Atrial fibrillation: Currently in normal sinus rhythm.  On Eliquis.  Hypokalemia:  Repleted  Nausea and vomiting in the setting of pyelonephritis continue supportive care.  COPD/OSA continue bedtime CPAP per respiratory.  Postoperative hypothyroidism/hypoparathyroidism : intact pth low at 8, tsh 0.5 nl.  Continue her Synthroid.  Calcium is stable.  Anemia appears chronic hemoglobin more or less stable 8 to 10 g.  Monitor.  Discharge Instructions  Discharge Instructions    Diet - low sodium heart healthy   Complete by: As directed    Discharge instructions   Complete by: As directed    Please call call MD or return to ER for similar or worsening recurring problem that brought you to hospital or if any fever,nausea/vomiting,abdominal pain, uncontrolled pain, chest pain,  shortness of breath or any other alarming symptoms.  Please follow-up your only doctor and urologist as instructed in 1-2 week time and call the office for appointment.  Please avoid alcohol, smoking, or any other illicit substance and maintain healthy habits including taking your regular medications as prescribed.  You were cared for by a hospitalist during your hospital stay. If you have any questions about your discharge medications  or the care you received while you were in the hospital after you  are discharged, you can call the unit and asked to speak with the hospitalist on call if the hospitalist that took care of you is not available. Once you are discharged, your primary care physician will handle any further medical issues. Please note that NO REFILLS for any discharge medications will be authorized once you are discharged, as it is imperative that you return to your primary care physician (or establish a relationship with a primary care physician if you do not have one) for your aftercare needs so that they can reassess your need for medications and monitor your lab values   Increase activity slowly   Complete by: As directed      Allergies as of 11/25/2018      Reactions   Other Anaphylaxis   Reaction to tree nuts   Amoxicillin-pot Clavulanate Other (See Comments)   headache   Ciprofloxacin Nausea Only, Rash   Food Hives, Other (See Comments)   Potato   Keflex [cephalexin] Rash   Penicillins Rash   Headache. And hives - Dose not tolerate Cephalosporin either   Has patient had a PCN reaction causing immediate rash, facial/tongue/throat swelling, SOB or lightheadedness with hypotension: No Has patient had a PCN reaction causing severe rash involving mucus membranes or skin necrosis: No Has patient had a PCN reaction that required hospitalization: No Has patient had a PCN reaction occurring within the last 10 years: Yes (reaction was May 2020) If all of the above answers are "NO", then may proce   Rocephin [ceftriaxone] Rash   Tomato Hives      Medication List    TAKE these medications   acetaminophen 325 MG tablet Commonly known as: TYLENOL Take 2 tablets (650 mg total) by mouth every 6 (six) hours as needed for mild pain or moderate pain.   albuterol 108 (90 Base) MCG/ACT inhaler Commonly known as: VENTOLIN HFA Inhale 2 puffs into the lungs every 6 (six) hours as needed for wheezing or shortness of breath.   albuterol (2.5 MG/3ML) 0.083% nebulizer solution Commonly  known as: PROVENTIL Take 3 mLs (2.5 mg total) by nebulization every 6 (six) hours as needed for wheezing or shortness of breath.   calcitRIOL 0.5 MCG capsule Commonly known as: ROCALTROL Take 2 mcg by mouth daily.   cyanocobalamin 1000 MCG/ML injection Commonly known as: (VITAMIN B-12) Inject 1 mL (1,000 mcg total) into the skin every 30 (thirty) days.   desoximetasone 0.05 % cream Commonly known as: TOPICORT Apply topically 2 (two) times daily as needed. What changed:   how much to take  reasons to take this   diclofenac sodium 1 % Gel Commonly known as: VOLTAREN Apply 2 g topically 4 (four) times daily.   diltiazem 120 MG 24 hr capsule Commonly known as: CARDIZEM CD Take 1 capsule (120 mg total) by mouth daily. Start taking on: November 26, 2018 What changed:   medication strength  how much to take   diphenhydrAMINE 25 mg capsule Commonly known as: BENADRYL Take 25 mg by mouth every 6 (six) hours as needed for itching.   Eliquis 5 MG Tabs tablet Generic drug: apixaban TAKE 1 TABLET BY MOUTH TWO  TIMES DAILY What changed: how much to take   EpiPen 2-Pak 0.3 mg/0.3 mL Soaj injection Generic drug: EPINEPHrine Inject 0.3 mg into the muscle once as needed for anaphylaxis (severe allergic reaction).   furosemide 40 MG tablet Commonly known as: LASIX  TAKE 2 TABLETS BY MOUTH  DAILY IN THE MORNING AND 1  TABLET IN THE EVENING What changed:   how much to take  how to take this  when to take this  additional instructions   hydrochlorothiazide 25 MG tablet Commonly known as: HYDRODIURIL TAKE 1 TABLET(25 MG) BY MOUTH DAILY IN THE MORNING What changed: See the new instructions.   hydrocortisone cream 1 % Apply topically 4 (four) times daily as needed for itching.   levocetirizine 5 MG tablet Commonly known as: XYZAL TAKE 1 TABLET BY MOUTH  EVERY EVENING   levothyroxine 100 MCG tablet Commonly known as: SYNTHROID Take 400 mcg by mouth daily before breakfast.    Melatonin 5 MG Tabs Take 5 mg by mouth at bedtime as needed (for sleep).   montelukast 10 MG tablet Commonly known as: SINGULAIR Take 1 tablet (10 mg total) by mouth at bedtime.   mupirocin ointment 2 % Commonly known as: BACTROBAN Place 1 application into the nose 2 (two) times daily.   nystatin 100000 UNIT/ML suspension Commonly known as: MYCOSTATIN TAKE 5 ML BY MOUTH FOUR TIMES DAILY What changed: See the new instructions.   nystatin cream Commonly known as: MYCOSTATIN Apply 1 application topically 2 (two) times daily.   omeprazole 40 MG capsule Commonly known as: PRILOSEC TAKE 1 CAPSULE BY MOUTH  DAILY   ondansetron 4 MG tablet Commonly known as: Zofran Take 1 tablet (4 mg total) by mouth every 8 (eight) hours as needed for nausea or vomiting.   potassium chloride 20 MEQ/15ML (10%) Soln Take 15 mLs (20 mEq total) by mouth 2 (two) times daily. What changed: when to take this   sulfamethoxazole-trimethoprim 800-160 MG tablet Commonly known as: BACTRIM DS Take 1 tablet by mouth 2 (two) times daily for 3 days.   Symbicort 160-4.5 MCG/ACT inhaler Generic drug: budesonide-formoterol INHALE 2 PUFFS BY MOUTH TWO TIMES DAILY What changed: See the new instructions.   SYRINGE 3CC/20GX1" 20G X 1" 3 ML Misc Use monthly as directed   traMADol 50 MG tablet Commonly known as: ULTRAM TAKE 1 TABLET(50 MG) BY MOUTH EVERY 12 HOURS AS NEEDED What changed:   how much to take  how to take this  when to take this  reasons to take this  additional instructions   Tums Ultra 1000 400 MG chewable tablet Generic drug: calcium elemental as carbonate Chew 2,000 mg by mouth 2 (two) times daily.   Vitamin D (Ergocalciferol) 1.25 MG (50000 UT) Caps capsule Commonly known as: DRISDOL Take 1 capsule (50,000 Units total) by mouth every Monday, Wednesday, and Friday.   zafirlukast 20 MG tablet Commonly known as: ACCOLATE TAKE 1 TABLET BY MOUTH TWO  TIMES DAILY BEFORE MEALS What  changed: See the new instructions.      Follow-up Information    Debbrah Alar, NP Follow up in 1 week(s).   Specialty: Internal Medicine Contact information: [place] [address] [phone]        Lelon Perla, MD .   Specialty: Cardiology Contact information: [address] [place] [address] [phone]        [place]. Call in 2 week(s).   Contact information: [place] [phone]         Allergies  Allergen Reactions  . Other Anaphylaxis    Reaction to tree nuts  . Amoxicillin-Pot Clavulanate Other (See Comments)    headache  .  Ciprofloxacin Nausea Only and Rash  . Food Hives and Other (See Comments)    Potato  . Keflex [Cephalexin] Rash  . Penicillins Rash    Headache. And hives - Dose not tolerate Cephalosporin either   Has patient had a PCN reaction causing immediate rash, facial/tongue/throat swelling, SOB or lightheadedness with hypotension: No Has patient had a PCN reaction causing severe rash involving mucus membranes or skin necrosis: No Has patient had a PCN reaction that required hospitalization: No Has patient had a PCN reaction occurring within the last 10 years: Yes (reaction was May 2020) If all of the above answers are "NO", then may proce  . Rocephin [Ceftriaxone] Rash  . Tomato Hives    Consultations:  Dr Megan Salon from [facility] over phone   Procedures/Studies: Ct Head Wo Contrast  Result Date: 11/21/2018 CLINICAL DATA:  Confusion, fatigue, vomiting EXAM: CT HEAD WITHOUT CONTRAST TECHNIQUE: Contiguous axial images were obtained from the base of the skull through the vertex without intravenous contrast. COMPARISON:  12/09/2016 FINDINGS: Brain: No evidence of acute infarction, hemorrhage, hydrocephalus, extra-axial collection or mass lesion/mass effect. Mild periventricular white matter hypodensity. Vascular: No  hyperdense vessel or unexpected calcification. Skull: Hyperostosis frontalis. Negative for fracture or focal lesion. Sinuses/Orbits: No acute finding. Other: None. IMPRESSION: No acute intracranial pathology. Mild small-vessel white matter disease. Electronically Signed   By: Eddie Candle M.D.   On: 11/21/2018 17:04   Ct Abdomen Pelvis W Contrast  Result Date: 11/21/2018 CLINICAL DATA:  63 y/o F; abdominal pain, back pain with nausea and vomiting, symptoms for 2 days. EXAM: CT ABDOMEN AND PELVIS WITH CONTRAST TECHNIQUE: Multidetector CT imaging of the abdomen and pelvis was performed using the standard protocol following bolus administration of intravenous contrast. CONTRAST:  115mL OMNIPAQUE IOHEXOL 300 MG/ML  SOLN COMPARISON:  10/20/2018 CT abdomen and pelvis. FINDINGS: Lower chest: No acute abnormality. Hepatobiliary: Hepatic steatosis. No focal liver abnormality is seen. No gallstones, gallbladder wall thickening, or biliary dilatation. Pancreas: Unremarkable. No pancreatic ductal dilatation or surrounding inflammatory changes. Spleen: Normal in size without focal abnormality. Adrenals/Urinary Tract: Adrenal glands are unremarkable. Bilateral nonobstructive nephrolithiasis. No hydronephrosis. Mild hypoenhancement of left kidney in comparison with the right kidney. Normal appearance of the bladder. Stomach/Bowel: Stomach is within normal limits. Stable prominent appendix stump without surrounding inflammatory changes. No evidence of bowel wall thickening, distention, or inflammatory changes. Diverticulosis, no findings of acute diverticulitis. Vascular/Lymphatic: Aortic atherosclerosis. No enlarged abdominal or pelvic lymph nodes. Reproductive: Uterus and bilateral adnexa are unremarkable. IUD well seated in uterine fundus. Other: No abdominal wall hernia or abnormality. No abdominopelvic ascites. Musculoskeletal: No fracture is seen. Advanced spondylosis and mild dextrocurvature of the lumbar spine. Flowing  anterior ossification of lower thoracic spine compatible with DISH. IMPRESSION: 1. Mild hypoenhancement of left kidney in comparison with the right kidney. Findings may represent a recently passed stone or possibly infection of the renal collecting system/pyelonephritis. 2. Bilateral nonobstructive nephrolithiasis. 3. Hepatic steatosis. 4. Diverticulosis without findings of acute diverticulitis. 5. Aortic Atherosclerosis (ICD10-I70.0). Electronically Signed   By: Kristine Garbe M.D.   On: 11/21/2018 20:55   Dg Chest Port 1 View  Result Date: 11/21/2018 CLINICAL DATA:  Confusion, vomiting, fatigue EXAM: PORTABLE CHEST 1 VIEW COMPARISON:  10/20/2018 FINDINGS: Cardiomegaly. Both lungs are clear. The visualized skeletal structures are unremarkable. IMPRESSION: Cardiomegaly without acute abnormality of the lungs in AP portable projection. Electronically Signed   By: Eddie Candle M.D.   On: 11/21/2018 17:02   Subjective: Resting well eating fine, ambulating,  no nausea or vomiting.  Tolerating diet.  No flank pain abdominal pain.  No fever.  Discharge home today.  Discharge Exam: Vitals:   11/25/18 0433 11/25/18 0749  BP: (!) 113/56   Pulse: 78 84  Resp: 17 16  Temp: 98.6 F (37 C)   SpO2: 96%    Vitals:   11/24/18 2000 11/25/18 0205 11/25/18 0433 11/25/18 0749  BP: (!) 106/54  (!) 113/56   Pulse: 73  78 84  Resp: 17  17 16   Temp: 98.8 F (37.1 C)  98.6 F (37 C)   TempSrc: Oral  Oral   SpO2: 99%  96%   Weight:  132.5 kg    Height:        General: Pt is alert, awake, not in acute distress Cardiovascular: RRR, S1/S2 +, no rubs, no gallops Respiratory: CTA bilaterally, no wheezing, no rhonchi Abdominal: Soft, NT, ND, bowel sounds + Extremities: no edema, no cyanosis   The results of significant diagnostics from this hospitalization (including imaging, microbiology, ancillary and laboratory) are listed below for reference.     Microbiology: Recent Results (from the past  240 hour(s))  Blood Culture (routine x 2)     Status: None (Preliminary result)   Collection Time: 11/21/18  4:27 PM   Specimen: BLOOD LEFT FOREARM  Result Value Ref Range Status   Specimen Description BLOOD LEFT FOREARM  Final   Special Requests   Final    AEROBIC BOTTLE ONLY Blood Culture results may not be optimal due to an inadequate volume of blood received in culture bottles   Culture  Setup Time   Final    GRAM POSITIVE COCCI AEROBIC BOTTLE ONLY CRITICAL RESULT CALLED TO, READ BACK BY AND VERIFIED WITH: PAHARMD E [place] [id number] AT 823 AM BY CM Performed at [facility], [place] [address], [place], [place] [address]    Culture GRAM POSITIVE COCCI  Final   Report Status PENDING  Incomplete  Blood Culture ID Panel (Reflexed)     Status: Abnormal   Collection Time: 11/21/18  4:27 PM  Result Value Ref Range Status   Enterococcus species NOT DETECTED NOT DETECTED Final   Listeria monocytogenes NOT DETECTED NOT DETECTED Final   Staphylococcus species DETECTED (A) NOT DETECTED Final    Comment: Methicillin (oxacillin) susceptible coagulase negative staphylococcus. Possible blood culture contaminant (unless isolated from more than one blood culture draw or clinical case suggests pathogenicity). No antibiotic treatment is indicated for blood  culture contaminants. CRITICAL RESULT CALLED TO, READ BACK BY AND VERIFIED WITH: PHARMD E [place] [id number] AT 51 AM BY CM    Staphylococcus aureus (BCID) NOT DETECTED NOT DETECTED Final   Methicillin resistance NOT DETECTED NOT DETECTED Final   Streptococcus species NOT DETECTED NOT DETECTED Final   Streptococcus agalactiae NOT DETECTED NOT DETECTED Final   Streptococcus pneumoniae NOT DETECTED NOT DETECTED Final   Streptococcus pyogenes NOT DETECTED NOT DETECTED Final   Acinetobacter baumannii NOT DETECTED NOT DETECTED Final   Enterobacteriaceae species NOT DETECTED NOT DETECTED Final   Enterobacter cloacae complex NOT DETECTED NOT  DETECTED Final   Escherichia coli NOT DETECTED NOT DETECTED Final   Klebsiella oxytoca NOT DETECTED NOT DETECTED Final   Klebsiella pneumoniae NOT DETECTED NOT DETECTED Final   Proteus species NOT DETECTED NOT DETECTED Final   Serratia marcescens NOT DETECTED NOT DETECTED Final   Haemophilus influenzae NOT DETECTED NOT DETECTED Final   Neisseria meningitidis NOT DETECTED NOT DETECTED Final   Pseudomonas aeruginosa NOT  DETECTED NOT DETECTED Final   Candida albicans NOT DETECTED NOT DETECTED Final   Candida glabrata NOT DETECTED NOT DETECTED Final   Candida krusei NOT DETECTED NOT DETECTED Final   Candida parapsilosis NOT DETECTED NOT DETECTED Final   Candida tropicalis NOT DETECTED NOT DETECTED Final    Comment: Performed at [facility], [place] [address], [place], [place] [address]  Blood Culture (routine x 2)     Status: Abnormal   Collection Time: 11/21/18  4:30 PM   Specimen: BLOOD  Result Value Ref Range Status   Specimen Description BLOOD LEFT ANTECUBITAL  Final   Special Requests   Final    BOTTLES DRAWN AEROBIC AND ANAEROBIC Blood Culture adequate volume   Culture  Setup Time   Final    GRAM NEGATIVE RODS AEROBIC BOTTLE ONLY CRITICAL RESULT CALLED TO, READ BACK BY AND VERIFIED WITH: Hyman Bible PharmD 15:40 11/22/18 ([email or web address]) Performed at [facility], [place] [address], [place], [place] [address]    Culture ESCHERICHIA COLI (A)  Final   Report Status 11/24/2018 FINAL  Final   Organism ID, Bacteria ESCHERICHIA COLI  Final      Susceptibility   Escherichia coli - MIC*    AMPICILLIN 4 SENSITIVE Sensitive     CEFAZOLIN <=4 SENSITIVE Sensitive     CEFEPIME <=1 SENSITIVE Sensitive     CEFTAZIDIME <=1 SENSITIVE Sensitive     CEFTRIAXONE <=1 SENSITIVE Sensitive     CIPROFLOXACIN <=0.25 SENSITIVE Sensitive     GENTAMICIN <=1 SENSITIVE Sensitive     IMIPENEM <=0.25 SENSITIVE Sensitive     TRIMETH/SULFA <=20 SENSITIVE Sensitive     AMPICILLIN/SULBACTAM <=2 SENSITIVE  Sensitive     PIP/TAZO <=4 SENSITIVE Sensitive     Extended ESBL NEGATIVE Sensitive     * ESCHERICHIA COLI  Blood Culture ID Panel (Reflexed)     Status: Abnormal   Collection Time: 11/21/18  4:30 PM  Result Value Ref Range Status   Enterococcus species NOT DETECTED NOT DETECTED Final   Listeria monocytogenes NOT DETECTED NOT DETECTED Final   Staphylococcus species NOT DETECTED NOT DETECTED Final   Staphylococcus aureus (BCID) NOT DETECTED NOT DETECTED Final   Streptococcus species NOT DETECTED NOT DETECTED Final   Streptococcus agalactiae NOT DETECTED NOT DETECTED Final   Streptococcus pneumoniae NOT DETECTED NOT DETECTED Final   Streptococcus pyogenes NOT DETECTED NOT DETECTED Final   Acinetobacter baumannii NOT DETECTED NOT DETECTED Final   Enterobacteriaceae species DETECTED (A) NOT DETECTED Final    Comment: Enterobacteriaceae represent a large family of gram-negative bacteria, not a single organism. CRITICAL RESULT CALLED TO, READ BACK BY AND VERIFIED WITH: Hyman Bible PharmD 15:40 11/22/18 ([email or web address])    Enterobacter cloacae complex NOT DETECTED NOT DETECTED Final   Escherichia coli DETECTED (A) NOT DETECTED Final    Comment: CRITICAL RESULT CALLED TO, READ BACK BY AND VERIFIED WITH: Hyman Bible PharmD 15:40 11/22/18 ([email or web address])    Klebsiella oxytoca NOT DETECTED NOT DETECTED Final   Klebsiella pneumoniae NOT DETECTED NOT DETECTED Final   Proteus species NOT DETECTED NOT DETECTED Final   Serratia marcescens NOT DETECTED NOT DETECTED Final   Carbapenem resistance NOT DETECTED NOT DETECTED Final   Haemophilus influenzae NOT DETECTED NOT DETECTED Final   Neisseria meningitidis NOT DETECTED NOT DETECTED Final   Pseudomonas aeruginosa NOT DETECTED NOT DETECTED Final   Candida albicans NOT DETECTED NOT DETECTED Final   Candida glabrata NOT DETECTED NOT DETECTED Final   Candida krusei NOT DETECTED NOT DETECTED  Final   Candida parapsilosis NOT DETECTED NOT DETECTED Final   Candida  tropicalis NOT DETECTED NOT DETECTED Final    Comment: Performed at [facility], [place] [address] [place], [place] [address]  SARS Coronavirus 2 (CEPHEID- Performed in [facility] lab), Hosp Order     Status: None   Collection Time: 11/21/18  5:18 PM   Specimen: Nasopharyngeal Swab  Result Value Ref Range Status   SARS Coronavirus 2 NEGATIVE NEGATIVE Final    Comment: (NOTE) If result is NEGATIVE SARS-CoV-2 target nucleic acids are NOT DETECTED. The SARS-CoV-2 RNA is generally detectable in upper and lower  respiratory specimens during the acute phase of infection. The lowest  concentration of SARS-CoV-2 viral copies this assay can detect is 250  copies / mL. A negative result does not preclude SARS-CoV-2 infection  and should not be used as the sole basis for treatment or other  patient management decisions.  A negative result may occur with  improper specimen collection / handling, submission of specimen other  than nasopharyngeal swab, presence of viral mutation(s) within the  areas targeted by this assay, and inadequate number of viral copies  (<250 copies / mL). A negative result must be combined with clinical  observations, patient history, and epidemiological information. If result is POSITIVE SARS-CoV-2 target nucleic acids are DETECTED. The SARS-CoV-2 RNA is generally detectable in upper and lower  respiratory specimens dur ing the acute phase of infection.  Positive  results are indicative of active infection with SARS-CoV-2.  Clinical  correlation with patient history and other diagnostic information is  necessary to determine patient infection status.  Positive results do  not rule out bacterial infection or co-infection with other viruses. If result is PRESUMPTIVE POSTIVE SARS-CoV-2 nucleic acids MAY BE PRESENT.   A presumptive positive result was obtained on the submitted specimen  and confirmed on repeat testing.  While 2019 novel coronavirus   (SARS-CoV-2) nucleic acids may be present in the submitted sample  additional confirmatory testing may be necessary for epidemiological  and / or clinical management purposes  to differentiate between  SARS-CoV-2 and other Sarbecovirus currently known to infect humans.  If clinically indicated additional testing with an alternate test  methodology [phone]) is advised. The SARS-CoV-2 RNA is generally  detectable in upper and lower respiratory sp ecimens during the acute  phase of infection. The expected result is Negative. Fact Sheet for Patients:  StrictlyIdeas.no Fact Sheet for Healthcare Providers: BankingDealers.co.za This test is not yet approved or cleared by the [place] FDA and has been authorized for detection and/or diagnosis of SARS-CoV-2 by FDA under an Emergency Use Authorization (EUA).  This EUA will remain in effect (meaning this test can be used) for the duration of the COVID-19 declaration under Section 564(b)(1) of the Act, 21 U.S.C. section 360bbb-3(b)(1), unless the authorization is terminated or revoked sooner. Performed at [facility], [place] [address] [place], [place] [address]   Urine culture     Status: Abnormal   Collection Time: 11/21/18  9:48 PM   Specimen: Urine, Random  Result Value Ref Range Status   Specimen Description URINE, RANDOM  Final   Special Requests   Final    NONE Performed at [facility], [place] [address], [place], [place] [address]    Culture >=100,000 COLONIES/mL ESCHERICHIA COLI (A)  Final   Report Status 11/24/2018 FINAL  Final   Organism ID, Bacteria ESCHERICHIA COLI (A)  Final  Susceptibility   Escherichia coli - MIC*    AMPICILLIN <=2 SENSITIVE Sensitive     CEFAZOLIN <=4 SENSITIVE Sensitive     CEFTRIAXONE <=1 SENSITIVE Sensitive     CIPROFLOXACIN <=0.25 SENSITIVE Sensitive     GENTAMICIN <=1 SENSITIVE Sensitive     IMIPENEM <=0.25 SENSITIVE Sensitive      NITROFURANTOIN <=16 SENSITIVE Sensitive     TRIMETH/SULFA <=20 SENSITIVE Sensitive     AMPICILLIN/SULBACTAM <=2 SENSITIVE Sensitive     PIP/TAZO <=4 SENSITIVE Sensitive     Extended ESBL NEGATIVE Sensitive     * >=100,000 COLONIES/mL ESCHERICHIA COLI  MRSA PCR Screening     Status: Abnormal   Collection Time: 11/22/18  2:15 AM   Specimen: Nasal Mucosa; Nasopharyngeal  Result Value Ref Range Status   MRSA by PCR POSITIVE (A) NEGATIVE Final    Comment:        The GeneXpert MRSA Assay (FDA approved for NASAL specimens only), is one component of a comprehensive MRSA colonization surveillance program. It is not intended to diagnose MRSA infection nor to guide or monitor treatment for MRSA infections. RESULT CALLED TO, READ BACK BY AND VERIFIED WITH: IRISH,N RN 11/22/2018 AT 0327 SKEEN,P Performed at [facility], [place] [address], [place], [place] [address]      Labs: BNP (last 3 results) Recent Labs    11/21/18 1742  BNP 38.4   Basic Metabolic Panel: Recent Labs  Lab 11/21/18 1742  11/22/18 0545 11/22/18 0855 11/22/18 1558 11/23/18 0334 11/24/18 0146 11/25/18 0537  NA 134*  --  133*  --   --  132* 134* 135  K 2.4*  --  2.7*  --  2.8* 2.6* 3.4* 3.4*  CL 91*  --  94*  --   --  95* 99 99  CO2 26  --  26  --   --  27 26 26   GLUCOSE 117*  --  123*  --   --  107* 93 98  BUN 25*  --  22  --   --  14 11 8   CREATININE 1.02*  --  0.98  --   --  0.68 0.74 0.75  CALCIUM 8.5*  --  7.7*  --   --  7.7* 8.1* 7.9*  MG  --    < >  --  1.9 1.8 1.8 2.1 2.1  PHOS  --   --   --  4.5  --   --   --   --    < > = values in this interval not displayed.   Liver Function Tests: Recent Labs  Lab 11/21/18 1742 11/22/18 0545  AST 25 20  ALT 34 28  ALKPHOS 116 98  BILITOT 1.2 1.1  PROT 7.4 6.1*  ALBUMIN 3.5 2.8*   No results for input(s): LIPASE, AMYLASE in the last 168 hours. Recent Labs  Lab 11/21/18 1740  AMMONIA 15   CBC: Recent Labs  Lab 11/21/18 1742 11/22/18 0306  11/23/18 0334 11/24/18 0146 11/25/18 0537  WBC 18.3* 21.0* 12.3* 6.6 5.7  NEUTROABS 16.3*  --   --   --   --   HGB 10.8* 10.4* 9.0* 8.7* 9.4*  HCT 35.6* 34.6* 30.0* 29.3* 31.0*  MCV 87.5 87.2 86.7 87.7 87.1  PLT 186 157 PLATELET CLUMPS NOTED ON SMEAR, UNABLE TO ESTIMATE PLATELET CLUMPS NOTED ON SMEAR, UNABLE TO ESTIMATE PLATELET CLUMPS NOTED ON SMEAR, UNABLE TO ESTIMATE   Cardiac Enzymes: No results for input(s): CKTOTAL, CKMB, CKMBINDEX,  TROPONINI in the last 168 hours. BNP: Invalid input(s): POCBNP CBG: No results for input(s): GLUCAP in the last 168 hours. D-Dimer No results for input(s): DDIMER in the last 72 hours. Hgb A1c No results for input(s): HGBA1C in the last 72 hours. Lipid Profile No results for input(s): CHOL, HDL, LDLCALC, TRIG, CHOLHDL, LDLDIRECT in the last 72 hours. Thyroid function studies Recent Labs    11/22/18 1558  TSH 0.582   Anemia work up No results for input(s): VITAMINB12, FOLATE, FERRITIN, TIBC, IRON, RETICCTPCT in the last 72 hours. Urinalysis    Component Value Date/Time   COLORURINE YELLOW 11/21/2018 2134   APPEARANCEUR HAZY (A) 11/21/2018 2134   LABSPEC 1.027 11/21/2018 2134   LABSPEC 1.010 06/09/2007 1500   PHURINE 6.0 11/21/2018 2134   GLUCOSEU NEGATIVE 11/21/2018 2134   GLUCOSEU NEGATIVE 02/16/2018 1202   HGBUR SMALL (A) 11/21/2018 2134   HGBUR negative 05/09/2010 1026   BILIRUBINUR NEGATIVE 11/21/2018 2134   BILIRUBINUR N 05/01/2016 1104   BILIRUBINUR Negative 06/09/2007 1500   KETONESUR NEGATIVE 11/21/2018 2134   PROTEINUR NEGATIVE 11/21/2018 2134   UROBILINOGEN 0.2 02/16/2018 1202   NITRITE POSITIVE (A) 11/21/2018 2134   LEUKOCYTESUR LARGE (A) 11/21/2018 2134   LEUKOCYTESUR Negative 06/09/2007 1500   Sepsis Labs Invalid input(s): PROCALCITONIN,  WBC,  LACTICIDVEN Microbiology Recent Results (from the past 240 hour(s))  Blood Culture (routine x 2)     Status: None (Preliminary result)   Collection Time: 11/21/18  4:27 PM    Specimen: BLOOD LEFT FOREARM  Result Value Ref Range Status   Specimen Description BLOOD LEFT FOREARM  Final   Special Requests   Final    AEROBIC BOTTLE ONLY Blood Culture results may not be optimal due to an inadequate volume of blood received in culture bottles   Culture  Setup Time   Final    GRAM POSITIVE COCCI AEROBIC BOTTLE ONLY CRITICAL RESULT CALLED TO, READ BACK BY AND VERIFIED WITH: PAHARMD E [place] [id number] AT 823 AM BY CM Performed at [facility], [place] [address] [place], [place] [address]    Culture GRAM POSITIVE COCCI  Final   Report Status PENDING  Incomplete  Blood Culture ID Panel (Reflexed)     Status: Abnormal   Collection Time: 11/21/18  4:27 PM  Result Value Ref Range Status   Enterococcus species NOT DETECTED NOT DETECTED Final   Listeria monocytogenes NOT DETECTED NOT DETECTED Final   Staphylococcus species DETECTED (A) NOT DETECTED Final    Comment: Methicillin (oxacillin) susceptible coagulase negative staphylococcus. Possible blood culture contaminant (unless isolated from more than one blood culture draw or clinical case suggests pathogenicity). No antibiotic treatment is indicated for blood  culture contaminants. CRITICAL RESULT CALLED TO, READ BACK BY AND VERIFIED WITH: PHARMD E [place] [id number] AT 67 AM BY CM    Staphylococcus aureus (BCID) NOT DETECTED NOT DETECTED Final   Methicillin resistance NOT DETECTED NOT DETECTED Final   Streptococcus species NOT DETECTED NOT DETECTED Final   Streptococcus agalactiae NOT DETECTED NOT DETECTED Final   Streptococcus pneumoniae NOT DETECTED NOT DETECTED Final   Streptococcus pyogenes NOT DETECTED NOT DETECTED Final   Acinetobacter baumannii NOT DETECTED NOT DETECTED Final   Enterobacteriaceae species NOT DETECTED NOT DETECTED Final   Enterobacter cloacae complex NOT DETECTED NOT DETECTED Final   Escherichia coli NOT DETECTED NOT DETECTED Final   Klebsiella oxytoca NOT DETECTED NOT DETECTED Final    Klebsiella pneumoniae NOT DETECTED NOT DETECTED Final   Proteus species NOT  DETECTED NOT DETECTED Final   Serratia marcescens NOT DETECTED NOT DETECTED Final   Haemophilus influenzae NOT DETECTED NOT DETECTED Final   Neisseria meningitidis NOT DETECTED NOT DETECTED Final   Pseudomonas aeruginosa NOT DETECTED NOT DETECTED Final   Candida albicans NOT DETECTED NOT DETECTED Final   Candida glabrata NOT DETECTED NOT DETECTED Final   Candida krusei NOT DETECTED NOT DETECTED Final   Candida parapsilosis NOT DETECTED NOT DETECTED Final   Candida tropicalis NOT DETECTED NOT DETECTED Final    Comment: Performed at [facility], [place] [address] [place], [place] [address]  Blood Culture (routine x 2)     Status: Abnormal   Collection Time: 11/21/18  4:30 PM   Specimen: BLOOD  Result Value Ref Range Status   Specimen Description BLOOD LEFT ANTECUBITAL  Final   Special Requests   Final    BOTTLES DRAWN AEROBIC AND ANAEROBIC Blood Culture adequate volume   Culture  Setup Time   Final    GRAM NEGATIVE RODS AEROBIC BOTTLE ONLY CRITICAL RESULT CALLED TO, READ BACK BY AND VERIFIED WITH: Hyman Bible PharmD 15:40 11/22/18 (wilsonm) Performed at [facility], [place] [address] [place], [place] [address]    Culture ESCHERICHIA COLI (A)  Final   Report Status 11/24/2018 FINAL  Final   Organism ID, Bacteria ESCHERICHIA COLI  Final      Susceptibility   Escherichia coli - MIC*    AMPICILLIN 4 SENSITIVE Sensitive     CEFAZOLIN <=4 SENSITIVE Sensitive     CEFEPIME <=1 SENSITIVE Sensitive     CEFTAZIDIME <=1 SENSITIVE Sensitive     CEFTRIAXONE <=1 SENSITIVE Sensitive     CIPROFLOXACIN <=0.25 SENSITIVE Sensitive     GENTAMICIN <=1 SENSITIVE Sensitive     IMIPENEM <=0.25 SENSITIVE Sensitive     TRIMETH/SULFA <=20 SENSITIVE Sensitive     AMPICILLIN/SULBACTAM <=2 SENSITIVE Sensitive     PIP/TAZO <=4 SENSITIVE Sensitive     Extended ESBL NEGATIVE Sensitive     * ESCHERICHIA COLI  Blood Culture ID  Panel (Reflexed)     Status: Abnormal   Collection Time: 11/21/18  4:30 PM  Result Value Ref Range Status   Enterococcus species NOT DETECTED NOT DETECTED Final   Listeria monocytogenes NOT DETECTED NOT DETECTED Final   Staphylococcus species NOT DETECTED NOT DETECTED Final   Staphylococcus aureus (BCID) NOT DETECTED NOT DETECTED Final   Streptococcus species NOT DETECTED NOT DETECTED Final   Streptococcus agalactiae NOT DETECTED NOT DETECTED Final   Streptococcus pneumoniae NOT DETECTED NOT DETECTED Final   Streptococcus pyogenes NOT DETECTED NOT DETECTED Final   Acinetobacter baumannii NOT DETECTED NOT DETECTED Final   Enterobacteriaceae species DETECTED (A) NOT DETECTED Final    Comment: Enterobacteriaceae represent a large family of gram-negative bacteria, not a single organism. CRITICAL RESULT CALLED TO, READ BACK BY AND VERIFIED WITH: Hyman Bible PharmD 15:40 11/22/18 (wilsonm)    Enterobacter cloacae complex NOT DETECTED NOT DETECTED Final   Escherichia coli DETECTED (A) NOT DETECTED Final    Comment: CRITICAL RESULT CALLED TO, READ BACK BY AND VERIFIED WITH: Hyman Bible PharmD 15:40 11/22/18 (wilsonm)    Klebsiella oxytoca NOT DETECTED NOT DETECTED Final   Klebsiella pneumoniae NOT DETECTED NOT DETECTED Final   Proteus species NOT DETECTED NOT DETECTED Final   Serratia marcescens NOT DETECTED NOT DETECTED Final   Carbapenem resistance NOT DETECTED NOT DETECTED Final   Haemophilus influenzae NOT DETECTED NOT DETECTED Final   Neisseria meningitidis NOT DETECTED NOT DETECTED  Final   Pseudomonas aeruginosa NOT DETECTED NOT DETECTED Final   Candida albicans NOT DETECTED NOT DETECTED Final   Candida glabrata NOT DETECTED NOT DETECTED Final   Candida krusei NOT DETECTED NOT DETECTED Final   Candida parapsilosis NOT DETECTED NOT DETECTED Final   Candida tropicalis NOT DETECTED NOT DETECTED Final    Comment: Performed at [facility], [place] [address] [place], [place] [address]   SARS Coronavirus 2 (CEPHEID- Performed in [place] [facility] lab), Hosp Order     Status: None   Collection Time: 11/21/18  5:18 PM   Specimen: Nasopharyngeal Swab  Result Value Ref Range Status   SARS Coronavirus 2 NEGATIVE NEGATIVE Final    Comment: (NOTE) If result is NEGATIVE SARS-CoV-2 target nucleic acids are NOT DETECTED. The SARS-CoV-2 RNA is generally detectable in upper and lower  respiratory specimens during the acute phase of infection. The lowest  concentration of SARS-CoV-2 viral copies this assay can detect is 250  copies / mL. A negative result does not preclude SARS-CoV-2 infection  and should not be used as the sole basis for treatment or other  patient management decisions.  A negative result may occur with  improper specimen collection / handling, submission of specimen other  than nasopharyngeal swab, presence of viral mutation(s) within the  areas targeted by this assay, and inadequate number of viral copies  (<250 copies / mL). A negative result must be combined with clinical  observations, patient history, and epidemiological information. If result is POSITIVE SARS-CoV-2 target nucleic acids are DETECTED. The SARS-CoV-2 RNA is generally detectable in upper and lower  respiratory specimens dur ing the acute phase of infection.  Positive  results are indicative of active infection with SARS-CoV-2.  Clinical  correlation with patient history and other diagnostic information is  necessary to determine patient infection status.  Positive results do  not rule out bacterial infection or co-infection with other viruses. If result is PRESUMPTIVE POSTIVE SARS-CoV-2 nucleic acids MAY BE PRESENT.   A presumptive positive result was obtained on the submitted specimen  and confirmed on repeat testing.  While 2019 novel coronavirus  (SARS-CoV-2) nucleic acids may be present in the submitted sample  additional confirmatory testing may be necessary for epidemiological   and / or clinical management purposes  to differentiate between  SARS-CoV-2 and other Sarbecovirus currently known to infect humans.  If clinically indicated additional testing with an alternate test  methodology [phone]) is advised. The SARS-CoV-2 RNA is generally  detectable in upper and lower respiratory sp ecimens during the acute  phase of infection. The expected result is Negative. Fact Sheet for Patients:  StrictlyIdeas.no Fact Sheet for Healthcare Providers: BankingDealers.co.za This test is not yet approved or cleared by the [place] FDA and has been authorized for detection and/or diagnosis of SARS-CoV-2 by FDA under an Emergency Use Authorization (EUA).  This EUA will remain in effect (meaning this test can be used) for the duration of the COVID-19 declaration under Section 564(b)(1) of the Act, 21 U.S.C. section 360bbb-3(b)(1), unless the authorization is terminated or revoked sooner. Performed at [facility], [place] [address] [place], [place] [address]   Urine culture     Status: Abnormal   Collection Time: 11/21/18  9:48 PM   Specimen: Urine, Random  Result Value Ref Range Status   Specimen Description URINE, RANDOM  Final   Special Requests   Final    NONE Performed at [facility], [place]  [address], [place], [place] [address]    Culture >=100,000 COLONIES/mL ESCHERICHIA COLI (A)  Final   Report Status 11/24/2018 FINAL  Final   Organism ID, Bacteria ESCHERICHIA COLI (A)  Final      Susceptibility   Escherichia coli - MIC*    AMPICILLIN <=2 SENSITIVE Sensitive     CEFAZOLIN <=4 SENSITIVE Sensitive     CEFTRIAXONE <=1 SENSITIVE Sensitive     CIPROFLOXACIN <=0.25 SENSITIVE Sensitive     GENTAMICIN <=1 SENSITIVE Sensitive     IMIPENEM <=0.25 SENSITIVE Sensitive     NITROFURANTOIN <=16 SENSITIVE Sensitive     TRIMETH/SULFA <=20 SENSITIVE Sensitive     AMPICILLIN/SULBACTAM <=2 SENSITIVE Sensitive      PIP/TAZO <=4 SENSITIVE Sensitive     Extended ESBL NEGATIVE Sensitive     * >=100,000 COLONIES/mL ESCHERICHIA COLI  MRSA PCR Screening     Status: Abnormal   Collection Time: 11/22/18  2:15 AM   Specimen: Nasal Mucosa; Nasopharyngeal  Result Value Ref Range Status   MRSA by PCR POSITIVE (A) NEGATIVE Final    Comment:        The GeneXpert MRSA Assay (FDA approved for NASAL specimens only), is one component of a comprehensive MRSA colonization surveillance program. It is not intended to diagnose MRSA infection nor to guide or monitor treatment for MRSA infections. RESULT CALLED TO, READ BACK BY AND VERIFIED WITH: IRISH,N RN 11/22/2018 AT 0327 SKEEN,P Performed at [facility], [place] [address] [place],  [address]      Time coordinating discharge: 25 minutes  SIGNED:   Antonieta Pert, MD  [facility] 11/25/2018, 9:47 AM  If 7PM-7AM, please contact night-coverage [email or web address]

## 2018-11-25 NOTE — Progress Notes (Signed)
Pt discharged home with self care in stable condition after going over discharge teaching with no concerns voiced

## 2018-11-25 NOTE — Telephone Encounter (Signed)
Copied from [place] [phone]. Topic: General - Inquiry >> Nov 25, 2018  2:19 PM Selinda Flavin B, NT wrote: Reason for CRM: Patient calling and would like ot know if Lenna Sciara could put in an order or send a letter to [facility] so she can have more sessions with them. States that she was just discharged from the hospital. Please advise.  CB#: [phone]

## 2018-11-25 NOTE — Progress Notes (Signed)
Patient reports she does not want to be a DNR . Request purple armband to be removed.

## 2018-11-29 ENCOUNTER — Telehealth: Payer: Self-pay | Admitting: Family

## 2018-11-29 NOTE — Telephone Encounter (Signed)
Short term disability forms dropped off. Given to [employer]

## 2018-11-29 NOTE — Telephone Encounter (Signed)
Called [employer], they will fax referral form to be filled out and send  back

## 2018-11-29 NOTE — Telephone Encounter (Signed)
Please contact [employer] and give verbal order to restart Home health PT.

## 2018-11-30 NOTE — Telephone Encounter (Signed)
Forms placed on provider's

## 2018-11-30 NOTE — Telephone Encounter (Signed)
Folder

## 2018-12-01 NOTE — Telephone Encounter (Signed)
Form received, will be put on physician's folder with records requested. Called patient to make her aware her request is in progress.

## 2018-12-05 DIAGNOSIS — Z0279 Encounter for issue of other medical certificate: Secondary | ICD-10-CM

## 2018-12-08 ENCOUNTER — Other Ambulatory Visit: Payer: Self-pay

## 2018-12-08 ENCOUNTER — Ambulatory Visit (INDEPENDENT_AMBULATORY_CARE_PROVIDER_SITE_OTHER): Payer: 59 | Admitting: Family

## 2018-12-08 DIAGNOSIS — A4151 Sepsis due to Escherichia coli [E. coli]: Secondary | ICD-10-CM | POA: Diagnosis not present

## 2018-12-08 DIAGNOSIS — E876 Hypokalemia: Secondary | ICD-10-CM | POA: Diagnosis not present

## 2018-12-08 DIAGNOSIS — N12 Tubulo-interstitial nephritis, not specified as acute or chronic: Secondary | ICD-10-CM | POA: Diagnosis not present

## 2018-12-08 DIAGNOSIS — I480 Paroxysmal atrial fibrillation: Secondary | ICD-10-CM | POA: Diagnosis not present

## 2018-12-08 NOTE — Progress Notes (Signed)
Virtual Visit via Video Note  I connected with [name] on 12/08/18 at 11:00 AM EDT by a video enabled telemedicine application and verified that I am speaking with the correct person using two identifiers.  Location: Patient: home Provider: work   I discussed the limitations of evaluation and management by telemedicine and the availability of in person appointments. The patient expressed understanding and agreed to proceed.  History of Present Illness:  Patient is a 63 yr old female who presents today for hospital follow up.  She was admitted on 11/21/18 with hypotension, fever, hypokalemia and leukopenia.  CT abd/pelvis suggested pyelonephritis.  Urine/blood culture grew pansensitive E coli.  She was treated with abx.  BP meds were adjusted due to hypotension.    She sees Dr. Louis Meckel ([facility]) and has follow up next week.    Reports diltiazem was cut in half.    Denies fever or buring. Chronic frequency due to diuretics.  Also has some generalized weakness.    Hypokalemia- doing kdur 70meq bid.   Past Medical History:  Diagnosis Date  . Abnormal Pap smear    years ago/no biopsy  . Allergic rhinitis   . Anemia, iron deficiency    iron infusion scheduled for 04/02/2018  . Aortic atherosclerosis ([place])   . Arthritis    back- lower  . Asthma   . Atrial fibrillation ([place]) August 2012   OFF XARELTO LAST MONTH DUE TO BLEEDING IN STOOL  . Bursitis of hip left  . Chronic diastolic (congestive) heart failure (HCC)    pt unaware of this  . COPD (chronic obstructive pulmonary disease) with chronic bronchitis ([place])   . Decreased pulses in feet   . Dysrhythmia    afib, followed by Dr. Stanford Breed   . Edema of both legs   . Esophagitis 02/02/2014   Distal, linear erosions, noted on endoscopy  . Fatty liver 01/07/2012  . Fibroid 1974   fibroid cyst on left fallopian tube  . Fibromyalgia   . Foot ulcer ([place])    AREA HEALED RIGHT FOOT  . GERD (gastroesophageal reflux  disease)   . Headache(784.0)    occasional sinus headache   . Hepatomegaly   . History of blood transfusion JULY 2015  . History of cardiomegaly 12/06/2010   Noted on CT  . History of E. coli septicemia   . Hypertension   . Hypoparathyroidism ([place]) 01/07/2011  . Hypothyroidism   . Kidney stone 09-Oct-2015   passed on their own  . Morbid obesity ([place])   . MRSA infection   . Nephrolithiasis 2/16, 9/16   SEES DR Risa Grill  . Pernicious anemia 02/20/2014   followed by Debbrah Alar  . Pneumonia   . PONV (postoperative nausea and vomiting)   . Pyelonephritis 11/24/2018  . Rash    thighs and back  . Seizures ([place])    infancy secondary to fever  . Staph aureus infection   . Thoracic spondylosis 12/06/2010   Noted on CT  . Thyroid cancer ([place]) 2011   THYROIDECTOMY DONE  . Uterine cancer ([place]) 2014   Mirena IUD  . Vitamin B 12 deficiency   . Vitamin D deficiency      Social History   Socioeconomic History  . Marital status: Single    Spouse name: Not on file  . Number of children: 0  . Years of education: Not on file  . Highest education level: Not on file  Occupational History  . Occupation: works in Insurance claims handler  .  Occupation: DESIGN COMPUTER CHIP    Employer: [employer]  Social Needs  . Financial resource strain: Not on file  . Food insecurity    Worry: Not on file    Inability: Not on file  . Transportation needs    Medical: Not on file    Non-medical: Not on file  Tobacco Use  . Smoking status: Former Smoker    Packs/day: 0.50    Years: 25.00    Pack years: 12.50    Types: Cigarettes    Start date: 12/24/1970    Quit date: 05/27/1995    Years since quitting: 23.5  . Smokeless tobacco: Never Used  . Tobacco comment: quit smoking 19 years ago  Substance and Sexual Activity  . Alcohol use: Yes    Alcohol/week: 0.0 standard drinks    Comment: 1/2 glass per month  . Drug use: No  . Sexual activity: Yes    Partners: Male    Birth control/protection:  Post-menopausal  Lifestyle  . Physical activity    Days per week: Not on file    Minutes per session: Not on file  . Stress: Not on file  Relationships  . Social Herbalist on phone: Not on file    Gets together: Not on file    Attends religious service: Not on file    Active member of club or organization: Not on file    Attends meetings of clubs or organizations: Not on file    Relationship status: Not on file  . Intimate partner violence    Fear of current or ex partner: Not on file    Emotionally abused: Not on file    Physically abused: Not on file    Forced sexual activity: Not on file  Other Topics Concern  . Not on file  Social History Narrative   Occupation: works in Insurance claims handler - Field seismologist   Single       Former Smoker - quit tobacco 12 years ago.  She was light smoker for 10 years.                 Past Surgical History:  Procedure Laterality Date  . AMPUTATION Right 05/18/2014   Procedure: RIGHT FIFTH RAY AMPUTATION FOOT;  Surgeon: Wylene Simmer, MD;  Location: [facility];  Service: Orthopedics;  Laterality: Right;  . APPENDECTOMY    . BUNIONECTOMY     bilateral  . COLONOSCOPY WITH PROPOFOL N/A 02/02/2014   Procedure: COLONOSCOPY WITH PROPOFOL;  Surgeon: Irene Shipper, MD;  Location: [facility];  Service: Endoscopy;  Laterality: N/A;  . cyst on ovary removed     . CYSTOSCOPY W/ RETROGRADES  03/03/2012   Procedure: CYSTOSCOPY WITH RETROGRADE PYELOGRAM;  Surgeon: Bernestine Amass, MD;  Location: [facility];  Service: Urology;  Laterality: Bilateral;  . CYSTOSCOPY W/ URETERAL STENT PLACEMENT Right 11/25/2017   Procedure: CYSTOSCOPY WITH RETROGRADE RIGHT URETERAL STENT PLACEMENT;  Surgeon: Franchot Gallo, MD;  Location: [facility];  Service: Urology;  Laterality: Right;  . CYSTOSCOPY W/ URETERAL STENT PLACEMENT Right 01/15/2018   Procedure: RIGHT STENT EXCHANGE;  Surgeon: Ardis Hughs, MD;  Location: [facility];  Service: Urology;  Laterality: Right;   . CYSTOSCOPY WITH RETROGRADE PYELOGRAM, URETEROSCOPY AND STENT PLACEMENT Left 08/25/2012   Procedure: CYSTOSCOPY WITH RETROGRADE PYELOGRAM, URETEROSCOPY;  Surgeon: Bernestine Amass, MD;  [facility]: [facility];  Service: Urology;  Laterality: Left;  . CYSTOSCOPY WITH STENT PLACEMENT Left 01/15/2018   Procedure: LEFT STENT  PLACEMENT;  Surgeon: Ardis Hughs, MD;  [facility];  Service: Urology;  Laterality: Left;  . CYSTOSCOPY WITH STENT PLACEMENT Right 01/22/2018   Procedure: RIGHT STENT REMOVAL;  Surgeon: Ardis Hughs, MD;  [facility];  Service: Urology;  Laterality: Right;  . CYSTOSCOPY/URETEROSCOPY/HOLMIUM LASER/STENT PLACEMENT Right 12/23/2017   Procedure: RIGHT URETEROSCOPY STONE REMOVAL, HOLMIUM LASER, RIGHT /STENT EXCHANGE;  Surgeon: Ardis Hughs, MD;  [facility];  Service: Urology;  Laterality: Right;  . CYSTOSCOPY/URETEROSCOPY/HOLMIUM LASER/STENT PLACEMENT Left 01/22/2018   Procedure: LEFT URETEROSCOPY STONE REMOVAL HOLMIUM LASER LEFT STENT Freddi Starr;  Surgeon: Ardis Hughs, MD;  [facility]: [facility];  Service: Urology;  Laterality: Left;  . DILATION AND CURETTAGE OF UTERUS N/A 02/21/2013   Procedure: DILATATION AND CURETTAGE;  Surgeon: Lyman Speller, MD;  Location: [facility];  Service: Gynecology;  Laterality: N/A;  . DILATION AND CURETTAGE OF UTERUS N/A 04/09/2015   Procedure: [place] IUD removal;  Surgeon: Megan Salon, MD;  Location: [facility];  Service: Gynecology;  Laterality: N/A;  Patient weight 307lbs  . ESOPHAGOGASTRODUODENOSCOPY (EGD) WITH PROPOFOL N/A 02/02/2014   Procedure: ESOPHAGOGASTRODUODENOSCOPY (EGD) WITH PROPOFOL;  Surgeon: Irene Shipper, MD;  [facility];  Service: Endoscopy;  Laterality: N/A;  . GASTRIC BYPASS  1974  . HOLMIUM LASER APPLICATION Left 01/01/8915   Procedure: HOLMIUM LASER APPLICATION;  Surgeon: Bernestine Amass, MD;  [facility];  Service: Urology;  Laterality: Left;  .  LITHOTRIPSY  03/2012  . LITHOTRIPSY  2/16  . THYROIDECTOMY  05/15/2010  . TONSILLECTOMY AND ADENOIDECTOMY    . URETEROSCOPY  03/03/2012   Procedure: URETEROSCOPY;  Surgeon: Bernestine Amass, MD;  Location[facility];  Service: Urology;  Laterality: Left;  . URETEROSCOPY WITH HOLMIUM LASER LITHOTRIPSY Bilateral 01/15/2018   Procedure: CYSTOSCOPY/BILATERAL URETEROSCOPY WITH HOLMIUM LASER LITHOTRIPSY STONE REMOVAL;  Surgeon: Ardis Hughs, MD;  Location[facility];  Service: Urology;  Laterality: Bilateral;    Family History  Problem Relation Age of Onset  . Hypertension Father   . Diabetes Father   . Lung cancer Father   . Heart attack Father        MI at age 58  . Hypertension Mother   . Hyperthyroidism Mother   . Heart disease Mother   . Heart attack Mother        MI at age 74  . Asthma Brother   . Hypertension Brother        younger  . Heart disease Brother        older  . Colon cancer Neg Hx   . Esophageal cancer Neg Hx   . Stomach cancer Neg Hx   . Kidney disease Neg Hx   . Liver disease Neg Hx     Allergies  Allergen Reactions  . Other Anaphylaxis    Reaction to tree nuts  . Amoxicillin-Pot Clavulanate Other (See Comments)    headache  . Ciprofloxacin Nausea Only and Rash  . Food Hives and Other (See Comments)    Potato  . Keflex [Cephalexin] Rash  . Penicillins Rash    Headache. And hives - Dose not tolerate Cephalosporin either   Has patient had a PCN reaction causing immediate rash, facial/tongue/throat swelling, SOB or lightheadedness with hypotension: No Has patient had a PCN reaction causing severe rash involving mucus membranes or skin necrosis: No Has patient had a PCN reaction that required hospitalization: No Has patient had a PCN reaction occurring within the last  10 years: Yes (reaction was May 2020) If all of the above answers are "NO", then may proce  . Rocephin [Ceftriaxone] Rash  . Tomato Hives    Current Outpatient Medications on File Prior to  Visit  Medication Sig Dispense Refill  . acetaminophen (TYLENOL) 325 MG tablet Take 2 tablets (650 mg total) by mouth every 6 (six) hours as needed for mild pain or moderate pain. 60 tablet 0  . albuterol (PROVENTIL HFA;VENTOLIN HFA) 108 (90 Base) MCG/ACT inhaler Inhale 2 puffs into the lungs every 6 (six) hours as needed for wheezing or shortness of breath. 1 Inhaler 5  . albuterol (PROVENTIL) (2.5 MG/3ML) 0.083% nebulizer solution Take 3 mLs (2.5 mg total) by nebulization every 6 (six) hours as needed for wheezing or shortness of breath. 150 mL 1  . calcitRIOL (ROCALTROL) 0.5 MCG capsule Take 2 mcg by mouth daily.     . calcium elemental as carbonate (TUMS ULTRA 1000) 400 MG chewable tablet Chew 2,000 mg by mouth 2 (two) times daily.    . cyanocobalamin (,VITAMIN B-12,) 1000 MCG/ML injection Inject 1 mL (1,000 mcg total) into the skin every 30 (thirty) days. 3 mL 4  . desoximetasone (TOPICORT) 0.05 % cream Apply topically 2 (two) times daily as needed. (Patient taking differently: Apply 1 application topically 2 (two) times daily as needed (for rash). ) 60 g 0  . diclofenac sodium (VOLTAREN) 1 % GEL Apply 2 g topically 4 (four) times daily. (Patient not taking: Reported on 11/22/2018) 100 g 3  . diltiazem (CARDIZEM CD) 120 MG 24 hr capsule Take 1 capsule (120 mg total) by mouth daily. 30 capsule 0  . diphenhydrAMINE (BENADRYL) 25 mg capsule Take 25 mg by mouth every 6 (six) hours as needed for itching.     [name] ELIQUIS 5 MG TABS tablet TAKE 1 TABLET BY MOUTH TWO  TIMES DAILY (Patient taking differently: Take 5 mg by mouth 2 (two) times daily. ) 180 tablet 1  . EPINEPHrine (EPIPEN 2-PAK) 0.3 mg/0.3 mL IJ SOAJ injection Inject 0.3 mg into the muscle once as needed for anaphylaxis (severe allergic reaction).     . furosemide (LASIX) 40 MG tablet TAKE 2 TABLETS BY MOUTH  DAILY IN THE MORNING AND 1  TABLET IN THE EVENING (Patient taking differently: Take 40-80 mg by mouth See admin instructions. Take 2  tablets (80 mg) by mouth every morning and 1 tablet (40 mg) every evening) 270 tablet 1  . hydrochlorothiazide (HYDRODIURIL) 25 MG tablet TAKE 1 TABLET(25 MG) BY MOUTH DAILY IN THE MORNING (Patient taking differently: Take 25 mg by mouth daily with breakfast. ) 30 tablet 3  . hydrocortisone cream 1 % Apply topically 4 (four) times daily as needed for itching. 30 g 0  . levocetirizine (XYZAL) 5 MG tablet TAKE 1 TABLET BY MOUTH  EVERY EVENING (Patient taking differently: Take 5 mg by mouth every evening. ) 90 tablet 3  . levothyroxine (SYNTHROID, LEVOTHROID) 100 MCG tablet Take 400 mcg by mouth daily before breakfast.     . Melatonin 5 MG TABS Take 5 mg by mouth at bedtime as needed (for sleep).     . montelukast (SINGULAIR) 10 MG tablet Take 1 tablet (10 mg total) by mouth at bedtime. 30 tablet 3  . mupirocin ointment (BACTROBAN) 2 % Place 1 application into the nose 2 (two) times daily. 22 g 0  . nystatin (MYCOSTATIN) 100000 UNIT/ML suspension TAKE 5 ML BY MOUTH FOUR TIMES DAILY (Patient taking differently: Use as  directed 5 mLs in the mouth or throat 4 (four) times daily. ) 200 mL 0  . nystatin cream (MYCOSTATIN) Apply 1 application topically 2 (two) times daily. 30 g 2  . omeprazole (PRILOSEC) 40 MG capsule TAKE 1 CAPSULE BY MOUTH  DAILY (Patient taking differently: Take 40 mg by mouth daily. ) 90 capsule 1  . ondansetron (ZOFRAN) 4 MG tablet Take 1 tablet (4 mg total) by mouth every 8 (eight) hours as needed for nausea or vomiting. 30 tablet 0  . potassium chloride 20 MEQ/15ML (10%) SOLN Take 15 mLs (20 mEq total) by mouth 2 (two) times daily. (Patient taking differently: Take 20 mEq by mouth daily. ) 450 mL 2  . SYMBICORT 160-4.5 MCG/ACT inhaler INHALE 2 PUFFS BY MOUTH TWO TIMES DAILY (Patient taking differently: Inhale 2 puffs into the lungs 2 (two) times a day. ) 30.6 g 1  . Syringe/Needle, Disp, (SYRINGE 3CC/20GX1") 20G X 1" 3 ML MISC Use monthly as directed 50 each 0  . traMADol (ULTRAM) 50 MG  tablet TAKE 1 TABLET(50 MG) BY MOUTH EVERY 12 HOURS AS NEEDED (Patient taking differently: Take 50 mg by mouth every 12 (twelve) hours as needed for moderate pain. ) 15 tablet 0  . Vitamin D, Ergocalciferol, (DRISDOL) 1.25 MG (50000 UT) CAPS capsule Take 1 capsule (50,000 Units total) by mouth every Monday, Wednesday, and Friday. 39 capsule 1  . zafirlukast (ACCOLATE) 20 MG tablet TAKE 1 TABLET BY MOUTH TWO  TIMES DAILY BEFORE MEALS (Patient taking differently: Take 20 mg by mouth 2 (two) times daily before a meal. ) 180 tablet 1   No current facility-administered medications on file prior to visit.     LMP 06/11/2012    Observations/Objective:   Gen: Awake, alert, no acute distress Resp: Breathing is even and non-labored Psych: calm/pleasant demeanor Neuro: Alert and Oriented x 3, + facial symmetry, speech is clear.   Assessment and Plan:   Debilitation- due to recent acute illness. I have recommended home health PT.   E Coli UTI/Sepsis- clinically resolved.  HTN- bp stable on current meds.   BP Readings from Last 3 Encounters:  11/25/18 (!) 113/56  11/21/18 (!) 148/73  10/24/18 125/60   Hypokalemia- s/p repletion, check follow up BMET.  Hypothyroid- Stable, continue current dose of synthroid.   Lab Results  Component Value Date   TSH 0.582 11/22/2018   Atrial fibrillation- rate stable today, maintained on Eliquis.  Follow Up Instructions:    I discussed the assessment and treatment plan with the patient. The patient was provided an opportunity to ask questions and all were answered. The patient agreed with the plan and demonstrated an understanding of the instructions.   The patient was advised to call back or seek an in-person evaluation if the symptoms worsen or if the condition fails to improve as anticipated.  Nance Pear, NP

## 2018-12-08 NOTE — Telephone Encounter (Signed)
Lets just do PT please.

## 2018-12-08 NOTE — Telephone Encounter (Signed)
Form faxed to [facility] again with information requested.

## 2018-12-08 NOTE — Telephone Encounter (Signed)
Pt ot referral was faxed yesterday.

## 2018-12-08 NOTE — Telephone Encounter (Signed)
I believe this paper was filled out last week. Can you please check status of PT/OT referral. Patient can't drive so she needs home health PT/OT. She has not been contacted yet.

## 2018-12-08 NOTE — Telephone Encounter (Signed)
[facility] is needing documentation that states the need for pt ot. Can you document the need in today's visit so it can be forwarded to them?

## 2018-12-10 ENCOUNTER — Other Ambulatory Visit: Payer: Self-pay

## 2018-12-12 ENCOUNTER — Observation Stay (HOSPITAL_COMMUNITY)
Admission: EM | Admit: 2018-12-12 | Discharge: 2018-12-14 | Disposition: A | Discharge status: Home / Self Care | Payer: 59 | Attending: Internal Medicine | Admitting: Internal Medicine

## 2018-12-12 ENCOUNTER — Emergency Department (HOSPITAL_COMMUNITY): Payer: 59

## 2018-12-12 ENCOUNTER — Other Ambulatory Visit: Payer: Self-pay

## 2018-12-12 DIAGNOSIS — I482 Chronic atrial fibrillation, unspecified: Secondary | ICD-10-CM | POA: Insufficient documentation

## 2018-12-12 DIAGNOSIS — Z79890 Hormone replacement therapy (postmenopausal): Secondary | ICD-10-CM | POA: Insufficient documentation

## 2018-12-12 DIAGNOSIS — E876 Hypokalemia: Secondary | ICD-10-CM | POA: Insufficient documentation

## 2018-12-12 DIAGNOSIS — R2681 Unsteadiness on feet: Secondary | ICD-10-CM | POA: Diagnosis not present

## 2018-12-12 DIAGNOSIS — W19XXXA Unspecified fall, initial encounter: Secondary | ICD-10-CM | POA: Insufficient documentation

## 2018-12-12 DIAGNOSIS — R531 Weakness: Secondary | ICD-10-CM

## 2018-12-12 DIAGNOSIS — Z87891 Personal history of nicotine dependence: Secondary | ICD-10-CM | POA: Insufficient documentation

## 2018-12-12 DIAGNOSIS — Z7951 Long term (current) use of inhaled steroids: Secondary | ICD-10-CM | POA: Insufficient documentation

## 2018-12-12 DIAGNOSIS — M25512 Pain in left shoulder: Secondary | ICD-10-CM | POA: Diagnosis not present

## 2018-12-12 DIAGNOSIS — E890 Postprocedural hypothyroidism: Secondary | ICD-10-CM | POA: Diagnosis not present

## 2018-12-12 DIAGNOSIS — Z791 Long term (current) use of non-steroidal anti-inflammatories (NSAID): Secondary | ICD-10-CM | POA: Insufficient documentation

## 2018-12-12 DIAGNOSIS — Z89421 Acquired absence of other right toe(s): Secondary | ICD-10-CM | POA: Insufficient documentation

## 2018-12-12 DIAGNOSIS — N200 Calculus of kidney: Secondary | ICD-10-CM

## 2018-12-12 DIAGNOSIS — M6281 Muscle weakness (generalized): Secondary | ICD-10-CM | POA: Insufficient documentation

## 2018-12-12 DIAGNOSIS — K760 Fatty (change of) liver, not elsewhere classified: Secondary | ICD-10-CM | POA: Diagnosis not present

## 2018-12-12 DIAGNOSIS — K219 Gastro-esophageal reflux disease without esophagitis: Secondary | ICD-10-CM | POA: Insufficient documentation

## 2018-12-12 DIAGNOSIS — E559 Vitamin D deficiency, unspecified: Secondary | ICD-10-CM | POA: Insufficient documentation

## 2018-12-12 DIAGNOSIS — Z66 Do not resuscitate: Secondary | ICD-10-CM | POA: Insufficient documentation

## 2018-12-12 DIAGNOSIS — N12 Tubulo-interstitial nephritis, not specified as acute or chronic: Secondary | ICD-10-CM | POA: Diagnosis present

## 2018-12-12 DIAGNOSIS — Z87442 Personal history of urinary calculi: Secondary | ICD-10-CM | POA: Insufficient documentation

## 2018-12-12 DIAGNOSIS — M797 Fibromyalgia: Secondary | ICD-10-CM | POA: Diagnosis not present

## 2018-12-12 DIAGNOSIS — Z8542 Personal history of malignant neoplasm of other parts of uterus: Secondary | ICD-10-CM | POA: Insufficient documentation

## 2018-12-12 DIAGNOSIS — Z1159 Encounter for screening for other viral diseases: Secondary | ICD-10-CM | POA: Diagnosis not present

## 2018-12-12 DIAGNOSIS — Z79899 Other long term (current) drug therapy: Secondary | ICD-10-CM | POA: Insufficient documentation

## 2018-12-12 DIAGNOSIS — M25569 Pain in unspecified knee: Secondary | ICD-10-CM | POA: Insufficient documentation

## 2018-12-12 DIAGNOSIS — Z0184 Encounter for antibody response examination: Principal | ICD-10-CM | POA: Insufficient documentation

## 2018-12-12 DIAGNOSIS — D509 Iron deficiency anemia, unspecified: Secondary | ICD-10-CM | POA: Insufficient documentation

## 2018-12-12 DIAGNOSIS — Z801 Family history of malignant neoplasm of trachea, bronchus and lung: Secondary | ICD-10-CM | POA: Insufficient documentation

## 2018-12-12 DIAGNOSIS — J45909 Unspecified asthma, uncomplicated: Secondary | ICD-10-CM | POA: Diagnosis present

## 2018-12-12 DIAGNOSIS — R296 Repeated falls: Secondary | ICD-10-CM | POA: Diagnosis not present

## 2018-12-12 DIAGNOSIS — I10 Essential (primary) hypertension: Secondary | ICD-10-CM | POA: Insufficient documentation

## 2018-12-12 DIAGNOSIS — J449 Chronic obstructive pulmonary disease, unspecified: Secondary | ICD-10-CM | POA: Diagnosis not present

## 2018-12-12 DIAGNOSIS — N390 Urinary tract infection, site not specified: Secondary | ICD-10-CM

## 2018-12-12 DIAGNOSIS — Z880 Allergy status to penicillin: Secondary | ICD-10-CM | POA: Insufficient documentation

## 2018-12-12 DIAGNOSIS — Z881 Allergy status to other antibiotic agents status: Secondary | ICD-10-CM | POA: Insufficient documentation

## 2018-12-12 DIAGNOSIS — D510 Vitamin B12 deficiency anemia due to intrinsic factor deficiency: Secondary | ICD-10-CM | POA: Insufficient documentation

## 2018-12-12 DIAGNOSIS — D72829 Elevated white blood cell count, unspecified: Secondary | ICD-10-CM | POA: Insufficient documentation

## 2018-12-12 DIAGNOSIS — L8990 Pressure ulcer of unspecified site, unspecified stage: Secondary | ICD-10-CM | POA: Insufficient documentation

## 2018-12-12 DIAGNOSIS — E039 Hypothyroidism, unspecified: Secondary | ICD-10-CM | POA: Diagnosis present

## 2018-12-12 DIAGNOSIS — Z8249 Family history of ischemic heart disease and other diseases of the circulatory system: Secondary | ICD-10-CM | POA: Insufficient documentation

## 2018-12-12 DIAGNOSIS — Z9884 Bariatric surgery status: Secondary | ICD-10-CM | POA: Insufficient documentation

## 2018-12-12 DIAGNOSIS — Z85850 Personal history of malignant neoplasm of thyroid: Secondary | ICD-10-CM | POA: Insufficient documentation

## 2018-12-12 DIAGNOSIS — Z7901 Long term (current) use of anticoagulants: Secondary | ICD-10-CM | POA: Insufficient documentation

## 2018-12-12 LAB — COMPREHENSIVE METABOLIC PANEL
ALT: 33 U/L (ref 0–44)
AST: 21 U/L (ref 15–41)
Albumin: 3.2 g/dL — ABNORMAL LOW (ref 3.5–5.0)
Alkaline Phosphatase: 115 U/L (ref 38–126)
Anion gap: 15 (ref 5–15)
BUN: 20 mg/dL (ref 8–23)
CO2: 29 mmol/L (ref 22–32)
Calcium: 9.5 mg/dL (ref 8.9–10.3)
Chloride: 90 mmol/L — ABNORMAL LOW (ref 98–111)
Creatinine, Ser: 0.9 mg/dL (ref 0.44–1.00)
GFR calc Af Amer: >60 mL/min (ref >60)
GFR calc non Af Amer: >60 mL/min (ref >60)
Glucose, Bld: 117 mg/dL — ABNORMAL HIGH (ref 70–99)
Potassium: 2.7 mmol/L — CL (ref 3.5–5.1)
Sodium: 134 mmol/L — ABNORMAL LOW (ref 135–145)
Total Bilirubin: 1.2 mg/dL (ref 0.3–1.2)
Total Protein: 7.5 g/dL (ref 6.5–8.1)

## 2018-12-12 LAB — CBC WITH DIFFERENTIAL/PLATELET
Abs Immature Granulocytes: 0.13 10*3/uL — ABNORMAL HIGH (ref 0.00–0.07)
Basophils Absolute: 0.1 10*3/uL (ref 0.0–0.1)
Basophils Relative: 0 %
Eosinophils Absolute: 0.1 10*3/uL (ref 0.0–0.5)
Eosinophils Relative: 0 %
HCT: 40.8 % (ref 36.0–46.0)
Hemoglobin: 12.3 g/dL (ref 12.0–15.0)
Immature Granulocytes: 1 %
Lymphocytes Relative: 3 %
Lymphs Abs: 0.5 10*3/uL — ABNORMAL LOW (ref 0.7–4.0)
MCH: 25.5 pg — ABNORMAL LOW (ref 26.0–34.0)
MCHC: 30.1 g/dL (ref 30.0–36.0)
MCV: 84.5 fL (ref 80.0–100.0)
Monocytes Absolute: 0.6 10*3/uL (ref 0.1–1.0)
Monocytes Relative: 4 %
Neutro Abs: 15.7 10*3/uL — ABNORMAL HIGH (ref 1.7–7.7)
Neutrophils Relative %: 92 %
Platelets: UNDETERMINED 10*3/uL (ref 150–400)
RBC: 4.83 MIL/uL (ref 3.87–5.11)
RDW: 16.8 % — ABNORMAL HIGH (ref 11.5–15.5)
WBC: 17.1 10*3/uL — ABNORMAL HIGH (ref 4.0–10.5)
nRBC: 0 % (ref 0.0–0.2)

## 2018-12-12 MED ORDER — SODIUM CHLORIDE 0.9 % IV BOLUS
1000.0000 mL | Freq: Once | INTRAVENOUS | Status: AC
  Administered 2018-12-12: 1000 mL via INTRAVENOUS

## 2018-12-12 MED ORDER — SODIUM CHLORIDE 0.9 % IV SOLN
1.0000 g | Freq: Once | INTRAVENOUS | Status: AC
  Administered 2018-12-13: 1 g via INTRAVENOUS
  Filled 2018-12-12: qty 10

## 2018-12-12 NOTE — ED Provider Notes (Signed)
[facility] EMERGENCY DEPARTMENT Provider Note   CSN: [id number] Arrival date & time: 12/12/18  2140    History   Chief Complaint Chief Complaint  Patient presents with  . Flank Pain  . Weakness  . Fall    HPI [name] is a 63 y.o. female a PMH significant for hospitalization in June 2020 for pyelonephritis due to pansensitive E. coli, hospitalization in May 2020 for E. coli bacteremia, COPD, atrial fibrillation anticoagulated with Eliquis, morbid obesity, thyroid cancer status post thyroidectomy, and nephrolithiasis who presents today with flank pain, weakness, and multiple falls in the last few days.  She reports that 2 days ago, she began to feel fatigued and weak and fell multiple times.  She has some pain in her left shoulder but denies any new bruising and denies hitting her head.  She says that she has had some pain on urination and reports some bilateral flank pain.  She also endorses urinary frequency and urgency as well as anorexia for the last 2 days.  She denies chest pain, shortness of breath, diarrhea, abdominal pain, and cough.      Past Medical History:  Diagnosis Date  . Abnormal Pap smear    years ago/no biopsy  . Allergic rhinitis   . Anemia, iron deficiency    iron infusion scheduled for 04/02/2018  . Aortic atherosclerosis ([place])   . Arthritis    back- lower  . Asthma   . Atrial fibrillation ([place]) August 2012   OFF XARELTO LAST MONTH DUE TO BLEEDING IN STOOL  . Bursitis of hip left  . Chronic diastolic (congestive) heart failure (HCC)    pt unaware of this  . COPD (chronic obstructive pulmonary disease) with chronic bronchitis ([place])   . Decreased pulses in feet   . Dysrhythmia    afib, followed by Dr. Stanford Breed   . Edema of both legs   . Esophagitis 02/02/2014   Distal, linear erosions, noted on endoscopy  . Fatty liver 01/07/2012  . Fibroid 1974   fibroid cyst on left fallopian tube  . Fibromyalgia   . Foot ulcer ([place])    AREA HEALED RIGHT FOOT  . GERD (gastroesophageal reflux disease)   . Headache(784.0)    occasional sinus headache   . Hepatomegaly   . History of blood transfusion JULY 2015  . History of cardiomegaly 12/06/2010   Noted on CT  . History of E. coli septicemia   . Hypertension   . Hypoparathyroidism ([place]) 01/07/2011  . Hypothyroidism   . Kidney stone Oct 13, 2015   passed on their own  . Morbid obesity ([place])   . MRSA infection   . Nephrolithiasis 2/16, 9/16   SEES DR Risa Grill  . Pernicious anemia 02/20/2014   followed by Debbrah Alar  . Pneumonia   . PONV (postoperative nausea and vomiting)   . Pyelonephritis 11/24/2018  . Rash    thighs and back  . Seizures ([place])    infancy secondary to fever  . Staph aureus infection   . Thoracic spondylosis 12/06/2010   Noted on CT  . Thyroid cancer ([place]) 2011   THYROIDECTOMY DONE  . Uterine cancer ([place]) 2014   Mirena IUD  . Vitamin B 12 deficiency   . Vitamin D deficiency     Patient Active Problem List   Diagnosis Date Noted  . Pyelonephritis 11/21/2018  . Sepsis due to Escherichia coli (E. coli) ([place]) 11/28/2017  . E coli bacteremia 11/28/2017  . Sepsis ([place]) 11/24/2017  .  Hypokalemia 12/10/2016  . Hypocalcemia 12/10/2016  . Recurrent falls 12/09/2016  . Right ureteral stone 07/17/2014  . Osteomyelitis ([place]) 06/14/2014  . Pernicious anemia 02/20/2014  . Reflux esophagitis 02/02/2014  . Edema 11/23/2013  . Foot pain 03/31/2013  . Endometrial adenocarcinoma ([place]) 02/21/2013  . Trochanteric bursitis of left hip 09/13/2012  . Muscle cramps 08/12/2012  . Fatty liver 01/07/2012  . Mid back pain 09/30/2011  . Weight gain 07/02/2011  . Nonspecific abnormal unspecified cardiovascular function study 04/30/2011  . Hx of papillary thyroid carcinoma 04/07/2011  . Low back pain 04/01/2011  . Atrial fibrillation ([place]) 02/05/2011  . Hypoparathyroidism ([place]) 01/07/2011  . GERD 05/22/2009  . Leukocytosis 03/24/2009  . OBESITY, MORBID  12/20/2008  . RHINOSINUSITIS, RECURRENT 09/07/2008  . Vitamin D deficiency 05/10/2007  . Iron deficiency anemia secondary to malabsorption  05/10/2007  . Hypothyroidism 01/05/2007  . Essential hypertension 01/05/2007  . ALLERGIC RHINITIS 01/05/2007  . Asthma 01/05/2007    Past Surgical History:  Procedure Laterality Date  . AMPUTATION Right 05/18/2014   Procedure: RIGHT FIFTH RAY AMPUTATION FOOT;  Surgeon: Wylene Simmer, MD;  Location: [facility];  Service: Orthopedics;  Laterality: Right;  . APPENDECTOMY    . BUNIONECTOMY     bilateral  . COLONOSCOPY WITH PROPOFOL N/A 02/02/2014   Procedure: COLONOSCOPY WITH PROPOFOL;  Surgeon: Irene Shipper, MD;  Location: [facility];  Service: Endoscopy;  Laterality: N/A;  . cyst on ovary removed     . CYSTOSCOPY W/ RETROGRADES  03/03/2012   Procedure: CYSTOSCOPY WITH RETROGRADE PYELOGRAM;  Surgeon: Bernestine Amass, MD;  Location: [facility];  Service: Urology;  Laterality: Bilateral;  . CYSTOSCOPY W/ URETERAL STENT PLACEMENT Right 11/25/2017   Procedure: CYSTOSCOPY WITH RETROGRADE RIGHT URETERAL STENT PLACEMENT;  Surgeon: Franchot Gallo, MD;  Location: [facility];  Service: Urology;  Laterality: Right;  . CYSTOSCOPY W/ URETERAL STENT PLACEMENT Right 01/15/2018   Procedure: RIGHT STENT EXCHANGE;  Surgeon: Ardis Hughs, MD;  [facility];  Service: Urology;  Laterality: Right;  . CYSTOSCOPY WITH RETROGRADE PYELOGRAM, URETEROSCOPY AND STENT PLACEMENT Left 08/25/2012   Procedure: CYSTOSCOPY WITH RETROGRADE PYELOGRAM, URETEROSCOPY;  Surgeon: Bernestine Amass, MD;  [facility];  Service: Urology;  Laterality: Left;  . CYSTOSCOPY WITH STENT PLACEMENT Left 01/15/2018   Procedure: LEFT STENT PLACEMENT;  Surgeon: Ardis Hughs, MD;  [facility];  Service: Urology;  Laterality: Left;  . CYSTOSCOPY WITH STENT PLACEMENT Right 01/22/2018   Procedure: RIGHT STENT REMOVAL;  Surgeon: Ardis Hughs, MD;  [facility];  Service: Urology;  Laterality:  Right;  . CYSTOSCOPY/URETEROSCOPY/HOLMIUM LASER/STENT PLACEMENT Right 12/23/2017   Procedure: RIGHT URETEROSCOPY STONE REMOVAL, HOLMIUM LASER, RIGHT /STENT EXCHANGE;  Surgeon: Ardis Hughs, MD;  [facility];  Service: Urology;  Laterality: Right;  . CYSTOSCOPY/URETEROSCOPY/HOLMIUM LASER/STENT PLACEMENT Left 01/22/2018   Procedure: LEFT URETEROSCOPY STONE REMOVAL HOLMIUM LASER LEFT STENT Freddi Starr;  Surgeon: Ardis Hughs, MD;  [facility];  Service: Urology;  Laterality: Left;  . DILATION AND CURETTAGE OF UTERUS N/A 02/21/2013   Procedure: DILATATION AND CURETTAGE;  Surgeon: Lyman Speller, MD;  Location: [facility];  Service: Gynecology;  Laterality: N/A;  . DILATION AND CURETTAGE OF UTERUS N/A 04/09/2015   Procedure: [place] IUD removal;  Surgeon: Megan Salon, MD;  Location: [facility];  Service: Gynecology;  Laterality: N/A;  Patient weight 307lbs  . ESOPHAGOGASTRODUODENOSCOPY (EGD) WITH PROPOFOL N/A 02/02/2014   Procedure: ESOPHAGOGASTRODUODENOSCOPY (EGD) WITH PROPOFOL;  Surgeon:  Irene Shipper, MD;  Location: [facility];  Service: Endoscopy;  Laterality: N/A;  . GASTRIC BYPASS  1974  . HOLMIUM LASER APPLICATION Left 07/02/5168   Procedure: HOLMIUM LASER APPLICATION;  Surgeon: Bernestine Amass, MD;  [facility];  Service: Urology;  Laterality: Left;  . LITHOTRIPSY  03/2012  . LITHOTRIPSY  2/16  . THYROIDECTOMY  05/15/2010  . TONSILLECTOMY AND ADENOIDECTOMY    . URETEROSCOPY  03/03/2012   Procedure: URETEROSCOPY;  Surgeon: Bernestine Amass, MD;  [facility];  Service: Urology;  Laterality: Left;  . URETEROSCOPY WITH HOLMIUM LASER LITHOTRIPSY Bilateral 01/15/2018   Procedure: CYSTOSCOPY/BILATERAL URETEROSCOPY WITH HOLMIUM LASER LITHOTRIPSY STONE REMOVAL;  Surgeon: Ardis Hughs, MD;  [facility];  Service: Urology;  Laterality: Bilateral;     OB History    Gravida  0   Para  0   Term  0   Preterm  0   AB  0    Living  0     SAB  0   TAB  0   Ectopic  0   Multiple  0   Live Births               Home Medications    Prior to Admission medications   Medication Sig Start Date End Date Taking? Authorizing Provider  acetaminophen (TYLENOL) 325 MG tablet Take 2 tablets (650 mg total) by mouth every 6 (six) hours as needed for mild pain or moderate pain. 12/08/16   Waynetta Pean, PA-C  albuterol (PROVENTIL HFA;VENTOLIN HFA) 108 (90 Base) MCG/ACT inhaler Inhale 2 puffs into the lungs every 6 (six) hours as needed for wheezing or shortness of breath. 11/10/16   Debbrah Alar, NP  albuterol (PROVENTIL) (2.5 MG/3ML) 0.083% nebulizer solution Take 3 mLs (2.5 mg total) by nebulization every 6 (six) hours as needed for wheezing or shortness of breath. 01/29/18   Debbrah Alar, NP  calcitRIOL (ROCALTROL) 0.5 MCG capsule Take 2 mcg by mouth daily.     [provider]  calcium elemental as carbonate (TUMS ULTRA 1000) 400 MG chewable tablet Chew 2,000 mg by mouth 2 (two) times daily.    [provider]  cyanocobalamin (,VITAMIN B-12,) 1000 MCG/ML injection Inject 1 mL (1,000 mcg total) into the skin every 30 (thirty) days. 08/04/18   Debbrah Alar, NP  desoximetasone (TOPICORT) 0.05 % cream Apply topically 2 (two) times daily as needed. Patient taking differently: Apply 1 application topically 2 (two) times daily as needed (for rash).  01/29/17   Debbrah Alar, NP  diclofenac sodium (VOLTAREN) 1 % GEL Apply 2 g topically 4 (four) times daily. Patient not taking: Reported on 11/22/2018 05/05/18   Debbrah Alar, NP  diltiazem (CARDIZEM CD) 120 MG 24 hr capsule Take 1 capsule (120 mg total) by mouth daily. 11/26/18 12/26/18  Antonieta Pert, MD  diphenhydrAMINE (BENADRYL) 25 mg capsule Take 25 mg by mouth every 6 (six) hours as needed for itching.     [provider]  ELIQUIS 5 MG TABS tablet TAKE 1 TABLET BY MOUTH TWO  TIMES DAILY Patient taking differently: Take 5  mg by mouth 2 (two) times daily.  09/16/18   Lelon Perla, MD  EPINEPHrine (EPIPEN 2-PAK) 0.3 mg/0.3 mL IJ SOAJ injection Inject 0.3 mg into the muscle once as needed for anaphylaxis (severe allergic reaction).     [provider]  furosemide (LASIX) 40 MG tablet TAKE 2 TABLETS BY MOUTH  DAILY IN THE MORNING AND 1  TABLET IN THE EVENING Patient taking differently: Take 40-80 mg by mouth See admin instructions. Take 2 tablets (80 mg) by mouth every morning and 1 tablet (40 mg) every evening 08/18/18   Debbrah Alar, NP  hydrochlorothiazide (HYDRODIURIL) 25 MG tablet TAKE 1 TABLET(25 MG) BY MOUTH DAILY IN THE MORNING Patient taking differently: Take 25 mg by mouth daily with breakfast.  08/13/18   Debbrah Alar, NP  hydrocortisone cream 1 % Apply topically 4 (four) times daily as needed for itching. 10/24/18   Cherylann Ratel A, DO  levocetirizine (XYZAL) 5 MG tablet TAKE 1 TABLET BY MOUTH  EVERY EVENING Patient taking differently: Take 5 mg by mouth every evening.  08/18/18   Debbrah Alar, NP  levothyroxine (SYNTHROID, LEVOTHROID) 100 MCG tablet Take 400 mcg by mouth daily before breakfast.     [provider]  Melatonin 5 MG TABS Take 5 mg by mouth at bedtime as needed (for sleep).     [provider]  montelukast (SINGULAIR) 10 MG tablet Take 1 tablet (10 mg total) by mouth at bedtime. 05/05/18   Debbrah Alar, NP  mupirocin ointment (BACTROBAN) 2 % Place 1 application into the nose 2 (two) times daily. 10/24/18   Cherylann Ratel A, DO  nystatin (MYCOSTATIN) 100000 UNIT/ML suspension TAKE 5 ML BY MOUTH FOUR TIMES DAILY Patient taking differently: Use as directed 5 mLs in the mouth or throat 4 (four) times daily.  08/24/18   Debbrah Alar, NP  nystatin cream (MYCOSTATIN) Apply 1 application topically 2 (two) times daily. 05/05/18   Debbrah Alar, NP  omeprazole (PRILOSEC) 40 MG capsule TAKE 1 CAPSULE BY MOUTH  DAILY Patient taking differently:  Take 40 mg by mouth daily.  08/18/18   Debbrah Alar, NP  ondansetron (ZOFRAN) 4 MG tablet Take 1 tablet (4 mg total) by mouth every 8 (eight) hours as needed for nausea or vomiting. 12/30/17   Debbrah Alar, NP  potassium chloride 20 MEQ/15ML (10%) SOLN Take 15 mLs (20 mEq total) by mouth 2 (two) times daily. Patient taking differently: Take 20 mEq by mouth daily.  10/12/18   Debbrah Alar, NP  SYMBICORT 160-4.5 MCG/ACT inhaler INHALE 2 PUFFS BY MOUTH TWO TIMES DAILY Patient taking differently: Inhale 2 puffs into the lungs 2 (two) times a day.  06/17/18   Debbrah Alar, NP  Syringe/Needle, Disp, (SYRINGE 3CC/20GX1") 20G X 1" 3 ML MISC Use monthly as directed 08/04/18   Debbrah Alar, NP  traMADol (ULTRAM) 50 MG tablet TAKE 1 TABLET(50 MG) BY MOUTH EVERY 12 HOURS AS NEEDED Patient taking differently: Take 50 mg by mouth every 12 (twelve) hours as needed for moderate pain.  10/13/18   Trula Slade, DPM  Vitamin D, Ergocalciferol, (DRISDOL) 1.25 MG (50000 UT) CAPS capsule Take 1 capsule (50,000 Units total) by mouth every Monday, Wednesday, and Friday. 05/31/18   Debbrah Alar, NP  zafirlukast (ACCOLATE) 20 MG tablet TAKE 1 TABLET BY MOUTH TWO  TIMES DAILY BEFORE MEALS Patient taking differently: Take 20 mg by mouth 2 (two) times daily before a meal.  08/18/18   Debbrah Alar, NP    Family History Family History  Problem Relation Age of Onset  . Hypertension Father   . Diabetes Father   . Lung cancer Father   . Heart attack Father        MI at age 31  . Hypertension Mother   . Hyperthyroidism Mother   . Heart disease Mother   . Heart attack Mother  MI at age 61  . Asthma Brother   . Hypertension Brother        younger  . Heart disease Brother        older  . Colon cancer Neg Hx   . Esophageal cancer Neg Hx   . Stomach cancer Neg Hx   . Kidney disease Neg Hx   . Liver disease Neg Hx     Social History Social History   Tobacco Use  .  Smoking status: Former Smoker    Packs/day: 0.50    Years: 25.00    Pack years: 12.50    Types: Cigarettes    Start date: 12/24/1970    Quit date: 05/27/1995    Years since quitting: 23.5  . Smokeless tobacco: Never Used  . Tobacco comment: quit smoking 19 years ago  Substance Use Topics  . Alcohol use: Yes    Alcohol/week: 0.0 standard drinks    Comment: 1/2 glass per month  . Drug use: No     Allergies   Other, Amoxicillin-pot clavulanate, Ciprofloxacin, Food, Keflex [cephalexin], Penicillins, Rocephin [ceftriaxone], and Tomato   Review of Systems Review of Systems  Constitutional: Positive for activity change, appetite change, fatigue and fever. Negative for chills.  HENT: Negative for congestion, rhinorrhea and sore throat.   Respiratory: Negative for shortness of breath.   Cardiovascular: Negative for chest pain, palpitations and leg swelling.  Gastrointestinal: Negative for abdominal pain, diarrhea, nausea and vomiting.  Genitourinary: Positive for dysuria, flank pain, frequency and urgency.  Musculoskeletal: Negative for gait problem.  Neurological: Positive for weakness (generalized). Negative for headaches.  Psychiatric/Behavioral: Negative for confusion.     Physical Exam Updated Vital Signs BP (!) 135/117 (BP Location: Left Arm)   Pulse 86   Temp (!) 100.9 F (38.3 C) (Oral)   Resp 20   LMP 06/11/2012   SpO2 94%   Physical Exam Constitutional:      General: She is not in acute distress.    Appearance: Normal appearance. She is obese. She is not toxic-appearing.  HENT:     Head: Normocephalic and atraumatic.     Right Ear: External ear normal.     Left Ear: External ear normal.     Nose: Nose normal. No congestion or rhinorrhea.     Mouth/Throat:     Mouth: Mucous membranes are dry.  Eyes:     Extraocular Movements: Extraocular movements intact.     Conjunctiva/sclera: Conjunctivae normal.     Pupils: Pupils are equal, round, and reactive to light.   Neck:     Musculoskeletal: Normal range of motion and neck supple.  Cardiovascular:     Rate and Rhythm: Normal rate and regular rhythm.     Heart sounds: No murmur.  Pulmonary:     Effort: Pulmonary effort is normal. No respiratory distress.     Breath sounds: Normal breath sounds.  Abdominal:     General: Abdomen is flat. Bowel sounds are normal.     Palpations: Abdomen is soft.     Tenderness: There is abdominal tenderness (generalized). There is right CVA tenderness and left CVA tenderness.  Musculoskeletal: Normal range of motion.  Skin:    General: Skin is warm and dry.  Neurological:     General: No focal deficit present.     Mental Status: She is alert and oriented to person, place, and time.  Psychiatric:        Mood and Affect: Mood normal.  Behavior: Behavior normal.      ED Treatments / Results  Labs (all labs ordered are listed, but only abnormal results are displayed) Labs Reviewed  URINE CULTURE  SARS CORONAVIRUS 2 (HOSPITAL ORDER, [facility])  CULTURE, BLOOD (ROUTINE X 2)  CULTURE, BLOOD (ROUTINE X 2)  COMPREHENSIVE METABOLIC PANEL  URINALYSIS, ROUTINE W REFLEX MICROSCOPIC  CBC WITH DIFFERENTIAL/PLATELET    EKG None  Radiology Dg Chest Portable 1 View  Result Date: 12/12/2018 CLINICAL DATA:  63 year old female with weakness. EXAM: PORTABLE CHEST 1 VIEW COMPARISON:  Chest radiograph dated 11/21/2018 FINDINGS: There is no focal consolidation, pleural effusion, or pneumothorax. Stable mild cardiomegaly. No acute osseous pathology. Thyroidectomy clips noted. IMPRESSION: No active disease. Electronically Signed   By: Anner Crete M.D.   On: 12/12/2018 22:43    Procedures Procedures (including critical care time)  Medications Ordered in ED Medications  sodium chloride 0.9 % bolus 1,000 mL (has no administration in time range)     Initial Impression / Assessment and Plan / ED Course  I have reviewed the triage  vital signs and the nursing notes.  Pertinent labs & imaging results that were available during my care of the patient were reviewed by me and considered in my medical decision making (see chart for details).       Concerned that patient has ongoing infection, likely urinary in nature, given her temperature of 100.9, weakness, urinary symptoms, and recent history of pyelonephritis.  Also concerned that patient could have infected nephrolithiasis.  Vitals are reassuring that patient does not have sepsis at this time.  Will order CT renal stone study as well as EKG, CBC, CMP, blood cultures, UA with micro and urine culture, and COVID testing.  Will give 1 L fluid bolus as well.  Patient was signed out to oncoming provider.  Final Clinical Impressions(s) / ED Diagnoses   Final diagnoses:  Generalized weakness    ED Discharge Orders    None       Kathrene Alu, MD 12/12/18 2248    Jola Schmidt, MD 12/12/18 2302

## 2018-12-12 NOTE — ED Triage Notes (Signed)
Pt brought in by [facility] from home for multiple complaints consisting of lower back/flank pain, bilateral leg weakness, bilateral foot pain, and multiple falls in the past two days. Pt states she lives alone, uses a walker. Pt states she was seen recently for kidney stones, states workup was negative.

## 2018-12-12 NOTE — ED Notes (Signed)
Lab called- asked for an order for lactic acid to be put in

## 2018-12-13 ENCOUNTER — Encounter (HOSPITAL_COMMUNITY): Payer: Self-pay | Admitting: Internal Medicine

## 2018-12-13 DIAGNOSIS — L8990 Pressure ulcer of unspecified site, unspecified stage: Secondary | ICD-10-CM | POA: Insufficient documentation

## 2018-12-13 DIAGNOSIS — I10 Essential (primary) hypertension: Secondary | ICD-10-CM

## 2018-12-13 DIAGNOSIS — N12 Tubulo-interstitial nephritis, not specified as acute or chronic: Secondary | ICD-10-CM | POA: Diagnosis not present

## 2018-12-13 DIAGNOSIS — E559 Vitamin D deficiency, unspecified: Secondary | ICD-10-CM | POA: Diagnosis not present

## 2018-12-13 DIAGNOSIS — E876 Hypokalemia: Secondary | ICD-10-CM | POA: Diagnosis not present

## 2018-12-13 DIAGNOSIS — N390 Urinary tract infection, site not specified: Secondary | ICD-10-CM

## 2018-12-13 HISTORY — DX: Urinary tract infection, site not specified: N39.0

## 2018-12-13 LAB — CBC
HCT: 34.1 % — ABNORMAL LOW (ref 36.0–46.0)
Hemoglobin: 10.3 g/dL — ABNORMAL LOW (ref 12.0–15.0)
MCH: 25.6 pg — ABNORMAL LOW (ref 26.0–34.0)
MCHC: 30.2 g/dL (ref 30.0–36.0)
MCV: 84.6 fL (ref 80.0–100.0)
Platelets: UNDETERMINED 10*3/uL (ref 150–400)
RBC: 4.03 MIL/uL (ref 3.87–5.11)
RDW: 16.8 % — ABNORMAL HIGH (ref 11.5–15.5)
WBC: 12.7 10*3/uL — ABNORMAL HIGH (ref 4.0–10.5)
nRBC: 0 % (ref 0.0–0.2)

## 2018-12-13 LAB — BASIC METABOLIC PANEL
Anion gap: 11 (ref 5–15)
BUN: 15 mg/dL (ref 8–23)
CO2: 26 mmol/L (ref 22–32)
Calcium: 8.4 mg/dL — ABNORMAL LOW (ref 8.9–10.3)
Chloride: 94 mmol/L — ABNORMAL LOW (ref 98–111)
Creatinine, Ser: 0.82 mg/dL (ref 0.44–1.00)
GFR calc Af Amer: >60 mL/min (ref >60)
GFR calc non Af Amer: >60 mL/min (ref >60)
Glucose, Bld: 99 mg/dL (ref 70–99)
Potassium: 3.1 mmol/L — ABNORMAL LOW (ref 3.5–5.1)
Sodium: 131 mmol/L — ABNORMAL LOW (ref 135–145)

## 2018-12-13 LAB — URINALYSIS, ROUTINE W REFLEX MICROSCOPIC
Bacteria, UA: NONE SEEN
Bilirubin Urine: NEGATIVE
Glucose, UA: NEGATIVE mg/dL
Ketones, ur: NEGATIVE mg/dL
Nitrite: POSITIVE — AB
Protein, ur: 30 mg/dL — AB
Specific Gravity, Urine: 1.012 (ref 1.005–1.030)
pH: 6 (ref 5.0–8.0)

## 2018-12-13 LAB — HIV ANTIBODY (ROUTINE TESTING W REFLEX): HIV Screen 4th Generation wRfx: NONREACTIVE

## 2018-12-13 LAB — SARS CORONAVIRUS 2 BY RT PCR (HOSPITAL ORDER, PERFORMED IN ~~LOC~~ HOSPITAL LAB): SARS Coronavirus 2: NEGATIVE

## 2018-12-13 MED ORDER — FUROSEMIDE 80 MG PO TABS
80.0000 mg | ORAL_TABLET | Freq: Every day | ORAL | Status: DC
  Administered 2018-12-13 – 2018-12-14 (×2): 80 mg via ORAL
  Filled 2018-12-13 (×2): qty 1

## 2018-12-13 MED ORDER — POTASSIUM CHLORIDE 10 MEQ/100ML IV SOLN
10.0000 meq | INTRAVENOUS | Status: AC
  Administered 2018-12-13 (×3): 10 meq via INTRAVENOUS
  Filled 2018-12-13 (×3): qty 100

## 2018-12-13 MED ORDER — APIXABAN 5 MG PO TABS
5.0000 mg | ORAL_TABLET | Freq: Two times a day (BID) | ORAL | Status: DC
  Administered 2018-12-13 – 2018-12-14 (×3): 5 mg via ORAL
  Filled 2018-12-13 (×3): qty 1

## 2018-12-13 MED ORDER — TRAMADOL HCL 50 MG PO TABS: 50.0000 mg | ORAL_TABLET | Freq: Two times a day (BID) | ORAL | Status: DC | PRN

## 2018-12-13 MED ORDER — SODIUM CHLORIDE 0.45 % IV SOLN
INTRAVENOUS | Status: DC
  Administered 2018-12-13: 10:00:00 via INTRAVENOUS

## 2018-12-13 MED ORDER — SODIUM CHLORIDE 0.45 % IV SOLN
INTRAVENOUS | Status: DC
  Administered 2018-12-13: 06:00:00 via INTRAVENOUS

## 2018-12-13 MED ORDER — MORPHINE SULFATE (PF) 4 MG/ML IV SOLN
4.0000 mg | Freq: Once | INTRAVENOUS | Status: AC
  Administered 2018-12-13: 4 mg via INTRAVENOUS
  Filled 2018-12-13: qty 1

## 2018-12-13 MED ORDER — SULFAMETHOXAZOLE-TRIMETHOPRIM 800-160 MG PO TABS
1.0000 | ORAL_TABLET | Freq: Two times a day (BID) | ORAL | Status: DC
  Administered 2018-12-13 – 2018-12-14 (×3): 1 via ORAL
  Filled 2018-12-13 (×3): qty 1

## 2018-12-13 MED ORDER — CALCITRIOL 0.5 MCG PO CAPS
2.0000 ug | ORAL_CAPSULE | Freq: Every day | ORAL | Status: DC
  Administered 2018-12-13 – 2018-12-14 (×2): 2 ug via ORAL
  Filled 2018-12-13 (×2): qty 4

## 2018-12-13 MED ORDER — POTASSIUM CHLORIDE CRYS ER 20 MEQ PO TBCR
40.0000 meq | EXTENDED_RELEASE_TABLET | Freq: Once | ORAL | Status: AC
  Administered 2018-12-13: 40 meq via ORAL
  Filled 2018-12-13: qty 2

## 2018-12-13 MED ORDER — PANTOPRAZOLE SODIUM 40 MG PO TBEC
40.0000 mg | DELAYED_RELEASE_TABLET | Freq: Every day | ORAL | Status: DC
  Administered 2018-12-13 – 2018-12-14 (×2): 40 mg via ORAL
  Filled 2018-12-13 (×2): qty 1

## 2018-12-13 MED ORDER — ACETAMINOPHEN 325 MG PO TABS
650.0000 mg | ORAL_TABLET | Freq: Four times a day (QID) | ORAL | Status: DC | PRN
  Filled 2018-12-13: qty 2

## 2018-12-13 MED ORDER — POTASSIUM CHLORIDE 20 MEQ/15ML (10%) PO SOLN
20.0000 meq | Freq: Two times a day (BID) | ORAL | Status: DC
  Administered 2018-12-13 – 2018-12-14 (×4): 20 meq via ORAL
  Filled 2018-12-13 (×4): qty 15

## 2018-12-13 MED ORDER — LEVOTHYROXINE SODIUM 100 MCG PO TABS
400.0000 ug | ORAL_TABLET | Freq: Every day | ORAL | Status: DC
  Administered 2018-12-13 – 2018-12-14 (×2): 400 ug via ORAL
  Filled 2018-12-13 (×2): qty 4

## 2018-12-13 MED ORDER — VITAMIN D (ERGOCALCIFEROL) 1.25 MG (50000 UNIT) PO CAPS
50000.0000 [IU] | ORAL_CAPSULE | ORAL | Status: DC
  Administered 2018-12-13: 50000 [IU] via ORAL
  Filled 2018-12-13: qty 1

## 2018-12-13 MED ORDER — DIPHENHYDRAMINE HCL 50 MG/ML IJ SOLN: 25.0000 mg | Freq: Four times a day (QID) | INTRAMUSCULAR | Status: DC | PRN

## 2018-12-13 MED ORDER — DICLOFENAC SODIUM 1 % TD GEL
4.0000 g | Freq: Four times a day (QID) | TRANSDERMAL | Status: DC
  Administered 2018-12-13 (×2): 4 g via TOPICAL
  Filled 2018-12-13: qty 100

## 2018-12-13 MED ORDER — TRAMADOL HCL 50 MG PO TABS
50.0000 mg | ORAL_TABLET | Freq: Four times a day (QID) | ORAL | Status: DC | PRN
  Administered 2018-12-14: 50 mg via ORAL
  Filled 2018-12-13: qty 1

## 2018-12-13 MED ORDER — DILTIAZEM HCL ER COATED BEADS 120 MG PO CP24
120.0000 mg | ORAL_CAPSULE | Freq: Every day | ORAL | Status: DC
  Administered 2018-12-13 – 2018-12-14 (×2): 120 mg via ORAL
  Filled 2018-12-13 (×2): qty 1

## 2018-12-13 MED ORDER — FUROSEMIDE 40 MG PO TABS
40.0000 mg | ORAL_TABLET | Freq: Every day | ORAL | Status: DC
  Administered 2018-12-13: 40 mg via ORAL
  Filled 2018-12-13: qty 1

## 2018-12-13 MED ORDER — ALBUTEROL SULFATE (2.5 MG/3ML) 0.083% IN NEBU: 3.0000 mL | INHALATION_SOLUTION | Freq: Four times a day (QID) | RESPIRATORY_TRACT | Status: DC | PRN

## 2018-12-13 MED ORDER — FUROSEMIDE 20 MG PO TABS: 40.0000 mg | ORAL_TABLET | ORAL | Status: DC

## 2018-12-13 MED ORDER — CHLORHEXIDINE GLUCONATE CLOTH 2 % EX PADS
6.0000 | MEDICATED_PAD | Freq: Every day | CUTANEOUS | Status: DC
  Administered 2018-12-14: 6 via TOPICAL

## 2018-12-13 MED ORDER — CETIRIZINE HCL 10 MG PO TABS
10.0000 mg | ORAL_TABLET | Freq: Every evening | ORAL | Status: DC
  Administered 2018-12-13: 10 mg via ORAL
  Filled 2018-12-13 (×2): qty 1

## 2018-12-13 MED ORDER — MUPIROCIN 2 % EX OINT
1.0000 "application " | TOPICAL_OINTMENT | Freq: Two times a day (BID) | CUTANEOUS | Status: DC
  Administered 2018-12-13 – 2018-12-14 (×3): 1 via NASAL
  Filled 2018-12-13: qty 22

## 2018-12-13 MED ORDER — SENNA 8.6 MG PO TABS
1.0000 | ORAL_TABLET | Freq: Two times a day (BID) | ORAL | Status: DC
  Administered 2018-12-13 – 2018-12-14 (×3): 8.6 mg via ORAL
  Filled 2018-12-13 (×3): qty 1

## 2018-12-13 MED ORDER — HYDROCODONE-ACETAMINOPHEN 5-325 MG PO TABS
1.0000 | ORAL_TABLET | Freq: Four times a day (QID) | ORAL | Status: DC | PRN
  Administered 2018-12-14: 1 via ORAL
  Filled 2018-12-13 (×2): qty 1

## 2018-12-13 NOTE — ED Notes (Signed)
Pt frequently asks to be re-positioned, moved, etc. Pt states that she is independent at home but I am concerned that pt is unable to care for herself.  For pt comfort, this nurse brought in a recliner and a walker, as this is how pt "rests and gets around at home". Pt struggled to stand unassisted. Once standing, able to walk several steps with walker and gait was much better. Pt needs PT consult prior to going home.

## 2018-12-13 NOTE — Discharge Instructions (Signed)

## 2018-12-13 NOTE — Plan of Care (Signed)

## 2018-12-13 NOTE — ED Provider Notes (Signed)
Care assumed from Dr. Venora Maples, patient with flank pain and fever-UTI versus pyelonephritis.  Urinalysis has come back with positive nitrite and large leukocyte esterase but only 0-5 WBCs on microscopic and no bacteria seen on microscopic.  Urine is been sent for culture.  Labs are significant for hypokalemia and leukocytosis with left shift.  She is given intravenous and oral potassium supplementation.  CT renal stone protocol showed fat stranding of the left kidney and also a calculus within the urinary bladder which may represent a passed renal calculus.  There were residual renal calculi in both kidneys.  With fever and leukocytosis, I was concerned that she might be better off with initial care in the hospital with intravenous antibiotics.  She was given initial dose of ceftriaxone.  Case is discussed with Dr. Linda Hedges will evaluate the patient for possible admission.  Results for orders placed or performed during the hospital encounter of 12/12/18  SARS Coronavirus 2 (CEPHEID- Performed in [place] hospital lab), [facility] Order   Specimen: Nasopharyngeal Swab  Result Value Ref Range   SARS Coronavirus 2 NEGATIVE NEGATIVE  Comprehensive metabolic panel  Result Value Ref Range   Sodium 134 (L) 135 - 145 mmol/L   Potassium 2.7 (LL) 3.5 - 5.1 mmol/L   Chloride 90 (L) 98 - 111 mmol/L   CO2 29 22 - 32 mmol/L   Glucose, Bld 117 (H) 70 - 99 mg/dL   BUN 20 8 - 23 mg/dL   Creatinine, Ser 0.90 0.44 - 1.00 mg/dL   Calcium 9.5 8.9 - 10.3 mg/dL   Total Protein 7.5 6.5 - 8.1 g/dL   Albumin 3.2 (L) 3.5 - 5.0 g/dL   AST 21 15 - 41 U/L   ALT 33 0 - 44 U/L   Alkaline Phosphatase 115 38 - 126 U/L   Total Bilirubin 1.2 0.3 - 1.2 mg/dL   GFR calc non Af Amer >60 >60 mL/min   GFR calc Af Amer >60 >60 mL/min   Anion gap 15 5 - 15  Urinalysis, Routine w reflex microscopic  Result Value Ref Range   Color, Urine YELLOW YELLOW   APPearance CLOUDY (A) CLEAR   Specific Gravity, Urine 1.012 1.005 - 1.030   pH 6.0  5.0 - 8.0   Glucose, UA NEGATIVE NEGATIVE mg/dL   Hgb urine dipstick SMALL (A) NEGATIVE   Bilirubin Urine NEGATIVE NEGATIVE   Ketones, ur NEGATIVE NEGATIVE mg/dL   Protein, ur 30 (A) NEGATIVE mg/dL   Nitrite POSITIVE (A) NEGATIVE   Leukocytes,Ua LARGE (A) NEGATIVE   RBC / HPF 0-5 0 - 5 RBC/hpf   WBC, UA 0-5 0 - 5 WBC/hpf   Bacteria, UA NONE SEEN NONE SEEN  CBC with Differential  Result Value Ref Range   WBC 17.1 (H) 4.0 - 10.5 K/uL   RBC 4.83 3.87 - 5.11 MIL/uL   Hemoglobin 12.3 12.0 - 15.0 g/dL   HCT 40.8 36.0 - 46.0 %   MCV 84.5 80.0 - 100.0 fL   MCH 25.5 (L) 26.0 - 34.0 pg   MCHC 30.1 30.0 - 36.0 g/dL   RDW 16.8 (H) 11.5 - 15.5 %   Platelets PLATELET CLUMPS NOTED ON SMEAR, UNABLE TO ESTIMATE 150 - 400 K/uL   nRBC 0.0 0.0 - 0.2 %   Neutrophils Relative % 92 %   Neutro Abs 15.7 (H) 1.7 - 7.7 K/uL   Lymphocytes Relative 3 %   Lymphs Abs 0.5 (L) 0.7 - 4.0 K/uL   Monocytes Relative 4 %  Monocytes Absolute 0.6 0.1 - 1.0 K/uL   Eosinophils Relative 0 %   Eosinophils Absolute 0.1 0.0 - 0.5 K/uL   Basophils Relative 0 %   Basophils Absolute 0.1 0.0 - 0.1 K/uL   Immature Granulocytes 1 %   Abs Immature Granulocytes 0.13 (H) 0.00 - 0.07 K/uL   *Note: Due to a large number of results and/or encounters for the requested time period, some results have not been displayed. A complete set of results can be found in Results Review.   Ct Head Wo Contrast  Result Date: 11/21/2018 CLINICAL DATA:  Confusion, fatigue, vomiting EXAM: CT HEAD WITHOUT CONTRAST TECHNIQUE: Contiguous axial images were obtained from the base of the skull through the vertex without intravenous contrast. COMPARISON:  12/09/2016 FINDINGS: Brain: No evidence of acute infarction, hemorrhage, hydrocephalus, extra-axial collection or mass lesion/mass effect. Mild periventricular white matter hypodensity. Vascular: No hyperdense vessel or unexpected calcification. Skull: Hyperostosis frontalis. Negative for fracture or focal  lesion. Sinuses/Orbits: No acute finding. Other: None. IMPRESSION: No acute intracranial pathology. Mild small-vessel white matter disease. Electronically Signed   By: Eddie Candle M.D.   On: 11/21/2018 17:04   Ct Abdomen Pelvis W Contrast  Result Date: 11/21/2018 CLINICAL DATA:  64 y/o F; abdominal pain, back pain with nausea and vomiting, symptoms for 2 days. EXAM: CT ABDOMEN AND PELVIS WITH CONTRAST TECHNIQUE: Multidetector CT imaging of the abdomen and pelvis was performed using the standard protocol following bolus administration of intravenous contrast. CONTRAST:  184mL OMNIPAQUE IOHEXOL 300 MG/ML  SOLN COMPARISON:  10/20/2018 CT abdomen and pelvis. FINDINGS: Lower chest: No acute abnormality. Hepatobiliary: Hepatic steatosis. No focal liver abnormality is seen. No gallstones, gallbladder wall thickening, or biliary dilatation. Pancreas: Unremarkable. No pancreatic ductal dilatation or surrounding inflammatory changes. Spleen: Normal in size without focal abnormality. Adrenals/Urinary Tract: Adrenal glands are unremarkable. Bilateral nonobstructive nephrolithiasis. No hydronephrosis. Mild hypoenhancement of left kidney in comparison with the right kidney. Normal appearance of the bladder. Stomach/Bowel: Stomach is within normal limits. Stable prominent appendix stump without surrounding inflammatory changes. No evidence of bowel wall thickening, distention, or inflammatory changes. Diverticulosis, no findings of acute diverticulitis. Vascular/Lymphatic: Aortic atherosclerosis. No enlarged abdominal or pelvic lymph nodes. Reproductive: Uterus and bilateral adnexa are unremarkable. IUD well seated in uterine fundus. Other: No abdominal wall hernia or abnormality. No abdominopelvic ascites. Musculoskeletal: No fracture is seen. Advanced spondylosis and mild dextrocurvature of the lumbar spine. Flowing anterior ossification of lower thoracic spine compatible with DISH. IMPRESSION: 1. Mild hypoenhancement of  left kidney in comparison with the right kidney. Findings may represent a recently passed stone or possibly infection of the renal collecting system/pyelonephritis. 2. Bilateral nonobstructive nephrolithiasis. 3. Hepatic steatosis. 4. Diverticulosis without findings of acute diverticulitis. 5. Aortic Atherosclerosis (ICD10-I70.0). Electronically Signed   By: Kristine Garbe M.D.   On: 11/21/2018 20:55   Dg Chest Portable 1 View  Result Date: 12/12/2018 CLINICAL DATA:  63 year old female with weakness. EXAM: PORTABLE CHEST 1 VIEW COMPARISON:  Chest radiograph dated 11/21/2018 FINDINGS: There is no focal consolidation, pleural effusion, or pneumothorax. Stable mild cardiomegaly. No acute osseous pathology. Thyroidectomy clips noted. IMPRESSION: No active disease. Electronically Signed   By: Anner Crete M.D.   On: 12/12/2018 22:43   Dg Chest Port 1 View  Result Date: 11/21/2018 CLINICAL DATA:  Confusion, vomiting, fatigue EXAM: PORTABLE CHEST 1 VIEW COMPARISON:  10/20/2018 FINDINGS: Cardiomegaly. Both lungs are clear. The visualized skeletal structures are unremarkable. IMPRESSION: Cardiomegaly without acute abnormality of the lungs in AP portable  projection. Electronically Signed   By: Eddie Candle M.D.   On: 11/21/2018 17:02   Ct Renal Stone Study  Result Date: 12/12/2018 CLINICAL DATA:  Flank pain EXAM: CT ABDOMEN AND PELVIS WITHOUT CONTRAST TECHNIQUE: Multidetector CT imaging of the abdomen and pelvis was performed following the standard protocol without IV contrast. COMPARISON:  November 21, 2018 FINDINGS: Lower chest: The lung bases are clear. The heart size is normal. Hepatobiliary: There is decreased hepatic attenuation suggestive of hepatic steatosis. The liver contour somewhat nodular. Normal gallbladder.There is no biliary ductal dilation. Pancreas: Normal contours without ductal dilatation. No peripancreatic fluid collection. Spleen: The spleen is enlarged measuring at least 13 cm.  Adrenals/Urinary Tract: --Adrenal glands: No adrenal hemorrhage. --Right kidney/ureter: There are multiple nonobstructing stones measuring up to approximately 1 cm. --Left kidney/ureter: There is fat stranding about the left kidney with mild left-sided collecting system dilatation. There are multiple nonobstructing stones in the lower pole the left kidney measuring up to approximately 8 mm. --Urinary bladder: There are new stones in the dependent portion of the urinary bladder. There is a punctate focus of gas within the urinary bladder. Stomach/Bowel: --Stomach/Duodenum: No hiatal hernia or other gastric abnormality. Normal duodenal course and caliber. --Small bowel: No dilatation or inflammation. --Colon: No focal abnormality. --Appendix: Not visualized. No right lower quadrant inflammation or free fluid. Vascular/Lymphatic: Atherosclerotic calcification is present within the non-aneurysmal abdominal aorta, without hemodynamically significant stenosis. There is likely moderate to high-grade stenosis involving the proximal left common iliac artery secondary to atherosclerotic disease --No retroperitoneal lymphadenopathy. --No mesenteric lymphadenopathy. --No pelvic or inguinal lymphadenopathy. Reproductive: Unremarkable Other: No ascites or free air. The abdominal wall is normal. Musculoskeletal. Multilevel degenerative disc disease and facet arthrosis. No bony spinal canal stenosis. IMPRESSION: 1. New fat stranding and free fluid about the left kidney with mild left-sided collecting system dilatation as well as new stones in the dependent portion of the urinary bladder is most consistent with sequela of a recently passed nephrolith on the left. 2. Bilateral nephrolithiasis as detailed above. 3. Hepatic steatosis. The contour of the left hepatic lobe appears nodular, raising concern for underlying cirrhosis. The spleen is enlarged suggestive of underlying portal hypertension. Electronically Signed   By: Constance Holster M.D.   On: 12/12/2018 23:10   Images viewed by me.    Delora Fuel, MD 68/12/75 [phone]

## 2018-12-13 NOTE — ED Notes (Signed)
ED TO INPATIENT HANDOFF REPORT  ED Nurse Name and Phone #:  [phone]  S Name/Age/Gender [name] 63 y.o. female Room/Bed: 030C/030C  Code Status   Code Status: DNR  Home/SNF/Other Home Patient oriented to: self, place, time and situation Is this baseline? Yes   Triage Complete: Triage complete  Chief Complaint flank pain  Triage Note Pt brought in by [facility] from home for multiple complaints consisting of lower back/flank pain, bilateral leg weakness, bilateral foot pain, and multiple falls in the past two days. Pt states she lives alone, uses a walker. Pt states she was seen recently for kidney stones, states workup was negative.    Allergies Allergies  Allergen Reactions  . Other Anaphylaxis    Reaction to tree nuts  . Amoxicillin-Pot Clavulanate Other (See Comments)    headache  . Ciprofloxacin Nausea Only and Rash  . Food Hives and Other (See Comments)    Potato  . Keflex [Cephalexin] Rash  . Penicillins Rash    Headache. And hives - Dose not tolerate Cephalosporin either   Has patient had a PCN reaction causing immediate rash, facial/tongue/throat swelling, SOB or lightheadedness with hypotension: No Has patient had a PCN reaction causing severe rash involving mucus membranes or skin necrosis: No Has patient had a PCN reaction that required hospitalization: No Has patient had a PCN reaction occurring within the last 10 years: Yes (reaction was May 2020) If all of the above answers are "NO", then may proce  . Rocephin [Ceftriaxone] Rash  . Tomato Hives    Level of Care/Admitting Diagnosis ED Disposition    ED Disposition Condition Comment   Admit  Hospital Area: [facility] [[id number]]  Level of Care: Med-Surg [16]  I expect the patient will be discharged within 24 hours: No (not a candidate for 5C-Observation unit)  Covid Evaluation: Confirmed COVID Negative  Diagnosis: Pyelonephritis [157262]  Admitting Physician: Neena Rhymes  [[id number]]  Attending Physician: Adella Hare E [[id number]]  PT Class (Do Not Modify): Observation [104]  PT Acc Code (Do Not Modify): Observation [10022]       B Medical/Surgery History Past Medical History:  Diagnosis Date  . Abnormal Pap smear    years ago/no biopsy  . Allergic rhinitis   . Anemia, iron deficiency    iron infusion scheduled for 04/02/2018  . Aortic atherosclerosis ([place])   . Arthritis    back- lower  . Asthma   . Atrial fibrillation ([place]) August 2012   OFF XARELTO LAST MONTH DUE TO BLEEDING IN STOOL  . Bursitis of hip left  . Chronic diastolic (congestive) heart failure (H[facility])    pt unaware of this  . COPD (chronic obstructive pulmonary disease) with chronic bronchitis ([place])   . Decreased pulses in feet   . Dysrhythmia    afib, followed by Dr. Stanford Breed   . Edema of both legs   . Esophagitis 02/02/2014   Distal, linear erosions, noted on endoscopy  . Fatty liver 01/07/2012  . Fibroid 1974   fibroid cyst on left fallopian tube  . Fibromyalgia   . Foot ulcer ([place])    AREA HEALED RIGHT FOOT  . GERD (gastroesophageal reflux disease)   . Headache(784.0)    occasional sinus headache   . Hepatomegaly   . History of blood transfusion JULY 2015  . History of cardiomegaly 12/06/2010   Noted on CT  . History of E. coli septicemia   . Hypertension   . Hypoparathyroidism ([place]) 01/07/2011  .  Hypothyroidism   . Kidney stone 2015-09-20   passed on their own  . Morbid obesity ([place])   . MRSA infection   . Nephrolithiasis 2/16, 9/16   SEES DR Risa Grill  . Pernicious anemia 02/20/2014   followed by [facility] Al[facility]  . Pneumonia   . PONV (postoperative nausea and vomiting)   . Pyelonephritis 11/24/2018  . Rash    thighs and back  . Seizures ([place])    infancy secondary to fever  . Staph aureus infection   . Thoracic spondylosis 12/06/2010   Noted on CT  . Thyroid cancer ([place]) 2011   THYROIDECTOMY DONE  . Uterine cancer ([place]) 2014   Mirena IUD  . Vitamin B 12  deficiency   . Vitamin D deficiency    Past Surgical History:  Procedure Laterality Date  . AMPUTATION Right 05/18/2014   Procedure: RIGHT FIFTH RAY AMPUTATION FOOT;  Surgeon: Wylene Simmer, MD;  Location: [facility];  Service: Orthopedics;  Laterality: Right;  . APPENDECTOMY    . BUNIONECTOMY     bilateral  . COLONOSCOPY WITH PROPOFOL N/A 02/02/2014   Procedure: COLONOSCOPY WITH PROPOFOL;  Surgeon: Irene Shipper, MD;  [facility];  Service: Endoscopy;  Laterality: N/A;  . cyst on ovary removed     . CYSTOSCOPY W/ RETROGRADES  03/03/2012   Procedure: CYSTOSCOPY WITH RETROGRADE PYELOGRAM;  Surgeon: Bernestine Amass, MD;  [facility]: [facility];  Service: Urology;  Laterality: Bilateral;  . CYSTOSCOPY W/ URETERAL STENT PLACEMENT Right 11/25/2017   Procedure: CYSTOSCOPY WITH RETROGRADE RIGHT URETERAL STENT PLACEMENT;  Surgeon: Franchot Gallo, MD;  Location: [facility];  Service: Urology;  Laterality: Right;  . CYSTOSCOPY W/ URETERAL STENT PLACEMENT Right 01/15/2018   Procedure: RIGHT STENT EXCHANGE;  Surgeon: Ardis Hughs, MD;  [facility];  Service: Urology;  Laterality: Right;  . CYSTOSCOPY WITH RETROGRADE PYELOGRAM, URETEROSCOPY AND STENT PLACEMENT Left 08/25/2012   Procedure: CYSTOSCOPY WITH RETROGRADE PYELOGRAM, URETEROSCOPY;  Surgeon: Bernestine Amass, MD;  [facility];  Service: Urology;  Laterality: Left;  . CYSTOSCOPY WITH STENT PLACEMENT Left 01/15/2018   Procedure: LEFT STENT PLACEMENT;  Surgeon: Ardis Hughs, MD;  [facility];  Service: Urology;  Laterality: Left;  . CYSTOSCOPY WITH STENT PLACEMENT Right 01/22/2018   Procedure: RIGHT STENT REMOVAL;  Surgeon: Ardis Hughs, MD;  [facility];  Service: Urology;  Laterality: Right;  . CYSTOSCOPY/URETEROSCOPY/HOLMIUM LASER/STENT PLACEMENT Right 12/23/2017   Procedure: RIGHT URETEROSCOPY STONE REMOVAL, HOLMIUM LASER, RIGHT /STENT EXCHANGE;  Surgeon: Ardis Hughs, MD;  [facility];  Service: Urology;   Laterality: Right;  . CYSTOSCOPY/URETEROSCOPY/HOLMIUM LASER/STENT PLACEMENT Left 01/22/2018   Procedure: LEFT URETEROSCOPY STONE REMOVAL HOLMIUM LASER LEFT STENT Freddi Starr;  Surgeon: Ardis Hughs, MD;  [facility];  Service: Urology;  Laterality: Left;  . DILATION AND CURETTAGE OF UTERUS N/A 02/21/2013   Procedure: DILATATION AND CURETTAGE;  Surgeon: Lyman Speller, MD;  Location: [facility];  Service: Gynecology;  Laterality: N/A;  . DILATION AND CURETTAGE OF UTERUS N/A 04/09/2015   Procedure: [place] IUD removal;  Surgeon: Megan Salon, MD;  Location: [facility];  Service: Gynecology;  Laterality: N/A;  Patient weight 307lbs  . ESOPHAGOGASTRODUODENOSCOPY (EGD) WITH PROPOFOL N/A 02/02/2014   Procedure: ESOPHAGOGASTRODUODENOSCOPY (EGD) WITH PROPOFOL;  Surgeon: Irene Shipper, MD;  [facility];  Service: Endoscopy;  Laterality: N/A;  . GASTRIC BYPASS  1974  . HOLMIUM LASER APPLICATION Left 09/24/7780   Procedure: HOLMIUM LASER APPLICATION;  Surgeon: Bernestine Amass, MD;  [facility];  Service: Urology;  Laterality: Left;  . LITHOTRIPSY  03/2012  . LITHOTRIPSY  2/16  . THYROIDECTOMY  05/15/2010  . TONSILLECTOMY AND ADENOIDECTOMY    . URETEROSCOPY  03/03/2012   Procedure: URETEROSCOPY;  Surgeon: Bernestine Amass, MD;  [facility];  Service: Urology;  Laterality: Left;  . URETEROSCOPY WITH HOLMIUM LASER LITHOTRIPSY Bilateral 01/15/2018   Procedure: CYSTOSCOPY/BILATERAL URETEROSCOPY WITH HOLMIUM LASER LITHOTRIPSY STONE REMOVAL;  Surgeon: Ardis Hughs, MD;  [facility];  Service: Urology;  Laterality: Bilateral;     A IV Location/Drains/Wounds Patient Lines/Drains/Airways Status   Active Line/Drains/Airways    Name:   Placement date:   Placement time:   Site:   Days:   Peripheral IV 12/12/18 Right Forearm   12/12/18    2142    Forearm   1          Intake/Output Last 24 hours No intake or output data in the 24 hours  ending 12/13/18 0352  Labs/Imaging Results for orders placed or performed during the hospital encounter of 12/12/18 (from the past 48 hour(s))  Comprehensive metabolic panel     Status: Abnormal   Collection Time: 12/12/18  9:40 PM  Result Value Ref Range   Sodium 134 (L) 135 - 145 mmol/L   Potassium 2.7 (LL) 3.5 - 5.1 mmol/L    Comment: CRITICAL RESULT CALLED TO, READ BACK BY AND VERIFIED WITH: KENNEDY R,RN 12/12/18 2306 WAYK    Chloride 90 (L) 98 - 111 mmol/L   CO2 29 22 - 32 mmol/L   Glucose, Bld 117 (H) 70 - 99 mg/dL   BUN 20 8 - 23 mg/dL   Creatinine, Ser 0.90 0.44 - 1.00 mg/dL   Calcium 9.5 8.9 - 10.3 mg/dL   Total Protein 7.5 6.5 - 8.1 g/dL   Albumin 3.2 (L) 3.5 - 5.0 g/dL   AST 21 15 - 41 U/L   ALT 33 0 - 44 U/L   Alkaline Phosphatase 115 38 - 126 U/L   Total Bilirubin 1.2 0.3 - 1.2 mg/dL   GFR calc non Af Amer >60 >60 mL/min   GFR calc Af Amer >60 >60 mL/min   Anion gap 15 5 - 15    Comment: Performed at [facility], [place] [address], [place], [place] [address]  CBC with Differential     Status: Abnormal   Collection Time: 12/12/18  9:40 PM  Result Value Ref Range   WBC 17.1 (H) 4.0 - 10.5 K/uL   RBC 4.83 3.87 - 5.11 MIL/uL   Hemoglobin 12.3 12.0 - 15.0 g/dL   HCT 40.8 36.0 - 46.0 %   MCV 84.5 80.0 - 100.0 fL   MCH 25.5 (L) 26.0 - 34.0 pg   MCHC 30.1 30.0 - 36.0 g/dL   RDW 16.8 (H) 11.5 - 15.5 %   Platelets PLATELET CLUMPS NOTED ON SMEAR, UNABLE TO ESTIMATE 150 - 400 K/uL   nRBC 0.0 0.0 - 0.2 %   Neutrophils Relative % 92 %   Neutro Abs 15.7 (H) 1.7 - 7.7 K/uL   Lymphocytes Relative 3 %   Lymphs Abs 0.5 (L) 0.7 - 4.0 K/uL   Monocytes Relative 4 %   Monocytes Absolute 0.6 0.1 - 1.0 K/uL   Eosinophils Relative 0 %   Eosinophils Absolute 0.1 0.0 - 0.5 K/uL   Basophils Relative 0 %   Basophils Absolute 0.1 0.0 - 0.1 K/uL   Immature Granulocytes  1 %   Abs Immature Granulocytes 0.13 (H) 0.00 - 0.07 K/uL    Comment: Performed at [facility],  [place] [address], [place], [place] [address]  SARS Coronavirus 2 (CEPHEID- Performed in [place] hospital lab), Hosp Order     Status: None   Collection Time: 12/12/18 11:23 PM   Specimen: Nasopharyngeal Swab  Result Value Ref Range   SARS Coronavirus 2 NEGATIVE NEGATIVE    Comment: (NOTE) If result is NEGATIVE SARS-CoV-2 target nucleic acids are NOT DETECTED. The SARS-CoV-2 RNA is generally detectable in upper and lower  respiratory specimens during the acute phase of infection. The lowest  concentration of SARS-CoV-2 viral copies this assay can detect is 250  copies / mL. A negative result does not preclude SARS-CoV-2 infection  and should not be used as the sole basis for treatment or other  patient management decisions.  A negative result may occur with  improper specimen collection / handling, submission of specimen other  than nasopharyngeal swab, presence of viral mutation(s) within the  areas targeted by this assay, and inadequate number of viral copies  (<250 copies / mL). A negative result must be combined with clinical  observations, patient history, and epidemiological information. If result is POSITIVE SARS-CoV-2 target nucleic acids are DETECTED. The SARS-CoV-2 RNA is generally detectable in upper and lower  respiratory specimens dur ing the acute phase of infection.  Positive  results are indicative of active infection with SARS-CoV-2.  Clinical  correlation with patient history and other diagnostic information is  necessary to determine patient infection status.  Positive results do  not rule out bacterial infection or co-infection with other viruses. If result is PRESUMPTIVE POSTIVE SARS-CoV-2 nucleic acids MAY BE PRESENT.   A presumptive positive result was obtained on the submitted specimen  and confirmed on repeat testing.  While 2019 novel coronavirus  (SARS-CoV-2) nucleic acids may be present in the submitted sample  additional confirmatory testing may be necessary  for epidemiological  and / or clinical management purposes  to differentiate between  SARS-CoV-2 and other Sarbecovirus currently known to infect humans.  If clinically indicated additional testing with an alternate test  methodology [phone]) is advised. The SARS-CoV-2 RNA is generally  detectable in upper and lower respiratory sp ecimens during the acute  phase of infection. The expected result is Negative. Fact Sheet for Patients:  StrictlyIdeas.no Fact Sheet for Healthcare Providers: BankingDealers.co.za This test is not yet approved or cleared by the [place] FDA and has been authorized for detection and/or diagnosis of SARS-CoV-2 by FDA under an Emergency Use Authorization (EUA).  This EUA will remain in effect (meaning this test can be used) for the duration of the COVID-19 declaration under Section 564(b)(1) of the Act, 21 U.S.C. section 360bbb-3(b)(1), unless the authorization is terminated or revoked sooner. Performed at [facility], [place] [address], [place], [place] [address]   Urinalysis, Routine w reflex microscopic     Status: Abnormal   Collection Time: 12/13/18  1:02 AM  Result Value Ref Range   Color, Urine YELLOW YELLOW   APPearance CLOUDY (A) CLEAR   Specific Gravity, Urine 1.012 1.005 - 1.030   pH 6.0 5.0 - 8.0   Glucose, UA NEGATIVE NEGATIVE mg/dL   Hgb urine dipstick SMALL (A) NEGATIVE   Bilirubin Urine NEGATIVE NEGATIVE   Ketones, ur NEGATIVE NEGATIVE mg/dL   Protein, ur 30 (A) NEGATIVE mg/dL   Nitrite POSITIVE (A) NEGATIVE   Leukocytes,Ua LARGE (A) NEGATIVE  RBC / HPF 0-5 0 - 5 RBC/hpf   WBC, UA 0-5 0 - 5 WBC/hpf   Bacteria, UA NONE SEEN NONE SEEN    Comment: Performed at [facility], [place] [address], [place], [place] [address]   *Note: Due to a large number of results and/or encounters for the requested time period, some results have not been displayed. A complete set of results can be  found in Results Review.   Dg Chest Portable 1 View  Result Date: 12/12/2018 CLINICAL DATA:  63 year old female with weakness. EXAM: PORTABLE CHEST 1 VIEW COMPARISON:  Chest radiograph dated 11/21/2018 FINDINGS: There is no focal consolidation, pleural effusion, or pneumothorax. Stable mild cardiomegaly. No acute osseous pathology. Thyroidectomy clips noted. IMPRESSION: No active disease. Electronically Signed   By: Anner Crete M.D.   On: 12/12/2018 22:43   Ct Renal Stone Study  Result Date: 12/12/2018 CLINICAL DATA:  Flank pain EXAM: CT ABDOMEN AND PELVIS WITHOUT CONTRAST TECHNIQUE: Multidetector CT imaging of the abdomen and pelvis was performed following the standard protocol without IV contrast. COMPARISON:  November 21, 2018 FINDINGS: Lower chest: The lung bases are clear. The heart size is normal. Hepatobiliary: There is decreased hepatic attenuation suggestive of hepatic steatosis. The liver contour somewhat nodular. Normal gallbladder.There is no biliary ductal dilation. Pancreas: Normal contours without ductal dilatation. No peripancreatic fluid collection. Spleen: The spleen is enlarged measuring at least 13 cm. Adrenals/Urinary Tract: --Adrenal glands: No adrenal hemorrhage. --Right kidney/ureter: There are multiple nonobstructing stones measuring up to approximately 1 cm. --Left kidney/ureter: There is fat stranding about the left kidney with mild left-sided collecting system dilatation. There are multiple nonobstructing stones in the lower pole the left kidney measuring up to approximately 8 mm. --Urinary bladder: There are new stones in the dependent portion of the urinary bladder. There is a punctate focus of gas within the urinary bladder. Stomach/Bowel: --Stomach/Duodenum: No hiatal hernia or other gastric abnormality. Normal duodenal course and caliber. --Small bowel: No dilatation or inflammation. --Colon: No focal abnormality. --Appendix: Not visualized. No right lower quadrant  inflammation or free fluid. Vascular/Lymphatic: Atherosclerotic calcification is present within the non-aneurysmal abdominal aorta, without hemodynamically significant stenosis. There is likely moderate to high-grade stenosis involving the proximal left common iliac artery secondary to atherosclerotic disease --No retroperitoneal lymphadenopathy. --No mesenteric lymphadenopathy. --No pelvic or inguinal lymphadenopathy. Reproductive: Unremarkable Other: No ascites or free air. The abdominal wall is normal. Musculoskeletal. Multilevel degenerative disc disease and facet arthrosis. No bony spinal canal stenosis. IMPRESSION: 1. New fat stranding and free fluid about the left kidney with mild left-sided collecting system dilatation as well as new stones in the dependent portion of the urinary bladder is most consistent with sequela of a recently passed nephrolith on the left. 2. Bilateral nephrolithiasis as detailed above. 3. Hepatic steatosis. The contour of the left hepatic lobe appears nodular, raising concern for underlying cirrhosis. The spleen is enlarged suggestive of underlying portal hypertension. Electronically Signed   By: Constance Holster M.D.   On: 12/12/2018 23:10    Pending Labs Unresulted Labs (From admission, onward)    Start     Ordered   12/14/18 2703  Basic metabolic panel  Tomorrow morning,   R     12/13/18 0348   12/14/18 0500  CBC  Tomorrow morning,   R     12/13/18 0348   12/13/18 0342  HIV antibody (Routine Testing)  Once,   STAT     12/13/18 0348   12/12/18 2223  Blood culture (routine x 2)  BLOOD CULTURE X 2,   STAT     12/12/18 2223   12/12/18 2202  Urine culture  ONCE - STAT,   STAT     12/12/18 2202          Vitals/Pain Today's Vitals   12/13/18 0000 12/13/18 0015 12/13/18 0030 12/13/18 0100  BP: (!) 121/49 112/60 124/63 127/64  Pulse: 86 86 88 89  Resp: 13 13 12 15   Temp:      TempSrc:      SpO2: 93% 95% 94% 95%  PainSc:        Isolation  Precautions Airborne and Contact precautions  Medications Medications  potassium chloride 10 mEq in 100 mL IVPB (10 mEq Intravenous New Bag/Given 12/13/18 0348)  acetaminophen (TYLENOL) tablet 650 mg (has no administration in time range)  diltiazem (CARDIZEM CD) 24 hr capsule 120 mg (has no administration in time range)  furosemide (LASIX) tablet 40-80 mg (has no administration in time range)  calcitRIOL (ROCALTROL) capsule 2 mcg (has no administration in time range)  levothyroxine (SYNTHROID) tablet 400 mcg (has no administration in time range)  pantoprazole (PROTONIX) EC tablet 40 mg (has no administration in time range)  apixaban (ELIQUIS) tablet 5 mg (has no administration in time range)  potassium chloride 20 MEQ/15ML (10%) solution 20 mEq (has no administration in time range)  Vitamin D (Ergocalciferol) (DRISDOL) capsule 50,000 Units (has no administration in time range)  albuterol (VENTOLIN HFA) 108 (90 Base) MCG/ACT inhaler 2 puff (has no administration in time range)  levocetirizine (XYZAL) tablet 5 mg (has no administration in time range)  0.45 % sodium chloride infusion (has no administration in time range)  traMADol (ULTRAM) tablet 50 mg (has no administration in time range)  senna (SENOKOT) tablet 8.6 mg (has no administration in time range)  sodium chloride 0.9 % bolus 1,000 mL (0 mLs Intravenous Stopped 12/13/18 0042)  cefTRIAXone (ROCEPHIN) 1 g in sodium chloride 0.9 % 100 mL IVPB (0 g Intravenous Stopped 12/13/18 0135)  potassium chloride SA (K-DUR) CR tablet 40 mEq (40 mEq Oral Given 12/13/18 0135)  morphine 4 MG/ML injection 4 mg (4 mg Intravenous Given 12/13/18 0134)    Mobility   High fall risk   Focused Assessments Cardiac Assessment Handoff:    Lab Results  Component Value Date   CKTOTAL 946 (H) 12/10/2016   TROPONINI <0.03 10/20/2018   Lab Results  Component Value Date   DDIMER 1.63 (H) 11/26/2017   Does the Patient currently have chest pain? No      R Recommendations: See Admitting Provider Note  Report given to:   Additional Notes:  Pt has a very unsteady gait, two person assist to stand or walk, healing wound sacral area, possibly cellulitis bilateral lower extremities, frequently requests assistance for repositioning, lives ALONE, may need [facility] consult

## 2018-12-13 NOTE — H&P (Signed)
History and Physical    [name] YJE:[id number] DOB: [birth year] DOA: 12/12/2018  PCP: Debbrah Alar, NP (Confirm with patient/family/NH records and if not entered, this has to be entered at [facility] point of entry) Patient coming from: Patient is coming from home  I have personally briefly reviewed patient's old medical records in [place] back through 2019  Chief Complaint: Flank pain generalized weakness and a fall  HPI: [name] is a 63 y.o. female with medical history significant of multiple episodes of nephrolithiasis and spontaneous passage of stones, iron deficiency anemia due to poor iron absorption after bariatric surgery, history of bursitis left hip, 2 of atrial fibrillation, history of COPD.  She has had significant problems with her feet including amputation of the fifth digit of the right foot and severe deformities of her great toes.  This has interfered with her ability to ambulate.  She also suffers with fibromyalgia and generalized weakness.  She has had progressive weakness.  She has had 3 hospitalizations for pyelonephritis and bacteremia,most recently November 25, 2018/  She has consistently grown E. coli which has been pan sensitive.  Her last 2 infections were successfully treated with Bactrim.  She does have multiple drug/antibiotic allergies. For the three days prior to admission she has had increasing flank pain, increasing weakness and clouded cognition.These condition precipitated a fall and EMS was called to assist her. Because of persistent symptoms, similar to previous infections, she presented to the [facility] ED.   ED Course: ED evaluation revealed a leukocytosis with left shift, essentially negative urinalysis and chemistries most notable for hypolakemia to 2.7. CT revealed stranding around the left kidney, nephrolithtiasis and dilation of the collecting system suggesting recent passage of stone and pyelonephritis. Stones were also noted int he bladder.  The patient appeared to be to sick to manage at home alone and [facility] was called to admit.  Review of Systems: As per HPI otherwise 10 point review of systems negative. She c/o left leg pain, right leg weakness and difficulty with ambulating or driving   Past Medical History:  Diagnosis Date   Abnormal Pap smear    years ago/no biopsy   Allergic rhinitis    Anemia, iron deficiency    iron infusion scheduled for 04/02/2018   Aortic atherosclerosis (HCC)    Arthritis    back- lower   Asthma    Atrial fibrillation ([place]) August 2012   OFF XARELTO LAST MONTH DUE TO BLEEDING IN STOOL   Bursitis of hip left   Chronic diastolic (congestive) heart failure (HCC)    pt unaware of this   COPD (chronic obstructive pulmonary disease) with chronic bronchitis (HCC)    Decreased pulses in feet    Dysrhythmia    afib, followed by Dr. Stanford Breed    Edema of both legs    Esophagitis 02/02/2014   Distal, linear erosions, noted on endoscopy   Fatty liver 01/07/2012   Fibroid 1974   fibroid cyst on left fallopian tube   Fibromyalgia    Foot ulcer ([place])    AREA HEALED RIGHT FOOT   GERD (gastroesophageal reflux disease)    Headache(784.0)    occasional sinus headache    Hepatomegaly    History of blood transfusion JULY 2015   History of cardiomegaly 12/06/2010   Noted on CT   History of E. coli septicemia    Hypertension    Hypoparathyroidism ([place]) 01/07/2011   Hypothyroidism    Kidney stone 09/30/2015   passed on  their own   Morbid obesity ([place])    MRSA infection    Nephrolithiasis 2/16, 9/16   SEES DR GRAPEY   Pernicious anemia 02/20/2014   followed by Debbrah Alar   Pneumonia    PONV (postoperative nausea and vomiting)    Pyelonephritis 11/24/2018   Rash    thighs and back   Seizures ([place])    infancy secondary to fever   Staph aureus infection    Thoracic spondylosis 12/06/2010   Noted on CT   Thyroid cancer ([place]) 2011   THYROIDECTOMY DONE     Uterine cancer ([place]) 2014   Mirena IUD   Vitamin B 12 deficiency    Vitamin D deficiency     Past Surgical History:  Procedure Laterality Date   AMPUTATION Right 05/18/2014   Procedure: RIGHT FIFTH RAY AMPUTATION FOOT;  Surgeon: Wylene Simmer, MD;  Location: [facility];  Service: Orthopedics;  Laterality: Right;   APPENDECTOMY     BUNIONECTOMY     bilateral   COLONOSCOPY WITH PROPOFOL N/A 02/02/2014   Procedure: COLONOSCOPY WITH PROPOFOL;  Surgeon: Irene Shipper, MD;  [facility];  Service: Endoscopy;  Laterality: N/A;   cyst on ovary removed      CYSTOSCOPY W/ RETROGRADES  03/03/2012   Procedure: CYSTOSCOPY WITH RETROGRADE PYELOGRAM;  Surgeon: Bernestine Amass, MD;  [facility];  Service: Urology;  Laterality: Bilateral;   CYSTOSCOPY W/ URETERAL STENT PLACEMENT Right 11/25/2017   Procedure: CYSTOSCOPY WITH RETROGRADE RIGHT URETERAL STENT PLACEMENT;  Surgeon: Franchot Gallo, MD;  Location: [facility];  Service: Urology;  Laterality: Right;   CYSTOSCOPY W/ URETERAL STENT PLACEMENT Right 01/15/2018   Procedure: RIGHT STENT EXCHANGE;  Surgeon: Ardis Hughs, MD;  [facility];  Service: Urology;  Laterality: Right;   CYSTOSCOPY WITH RETROGRADE PYELOGRAM, URETEROSCOPY AND STENT PLACEMENT Left 08/25/2012   Procedure: CYSTOSCOPY WITH RETROGRADE PYELOGRAM, URETEROSCOPY;  Surgeon: Bernestine Amass, MD;  [facility];  Service: Urology;  Laterality: Left;   CYSTOSCOPY WITH STENT PLACEMENT Left 01/15/2018   Procedure: LEFT STENT PLACEMENT;  Surgeon: Ardis Hughs, MD;  [facility];  Service: Urology;  Laterality: Left;   CYSTOSCOPY WITH STENT PLACEMENT Right 01/22/2018   Procedure: RIGHT STENT REMOVAL;  Surgeon: Ardis Hughs, MD;  [facility];  Service: Urology;  Laterality: Right;   CYSTOSCOPY/URETEROSCOPY/HOLMIUM LASER/STENT PLACEMENT Right 12/23/2017   Procedure: RIGHT URETEROSCOPY STONE REMOVAL, HOLMIUM LASER, RIGHT /STENT EXCHANGE;  Surgeon:  Ardis Hughs, MD;  [facility];  Service: Urology;  Laterality: Right;   CYSTOSCOPY/URETEROSCOPY/HOLMIUM LASER/STENT PLACEMENT Left 01/22/2018   Procedure: LEFT URETEROSCOPY STONE REMOVAL HOLMIUM LASER LEFT STENT Freddi Starr;  Surgeon: Ardis Hughs, MD;  [facility];  Service: Urology;  Laterality: Left;   DILATION AND CURETTAGE OF UTERUS N/A 02/21/2013   Procedure: DILATATION AND CURETTAGE;  Surgeon: Lyman Speller, MD;  Location: [facility];  Service: Gynecology;  Laterality: N/A;   DILATION AND CURETTAGE OF UTERUS N/A 04/09/2015   Procedure: [place] IUD removal;  Surgeon: Megan Salon, MD;  Location: [facility];  Service: Gynecology;  Laterality: N/A;  Patient weight 307lbs   ESOPHAGOGASTRODUODENOSCOPY (EGD) WITH PROPOFOL N/A 02/02/2014   Procedure: ESOPHAGOGASTRODUODENOSCOPY (EGD) WITH PROPOFOL;  Surgeon: Irene Shipper, MD;  [facility];  Service: Endoscopy;  Laterality: N/A;   GASTRIC BYPASS  1974   HOLMIUM LASER APPLICATION Left 9/0/2409   Procedure: HOLMIUM LASER APPLICATION;  Surgeon: Bernestine Amass, MD;  Location: [facility];  Service: Urology;  Laterality: Left;   LITHOTRIPSY  03/2012   LITHOTRIPSY  2/16   THYROIDECTOMY  05/15/2010   TONSILLECTOMY AND ADENOIDECTOMY     URETEROSCOPY  03/03/2012   Procedure: URETEROSCOPY;  Surgeon: Bernestine Amass, MD;  [facility];  Service: Urology;  Laterality: Left;   URETEROSCOPY WITH HOLMIUM LASER LITHOTRIPSY Bilateral 01/15/2018   Procedure: CYSTOSCOPY/BILATERAL URETEROSCOPY WITH HOLMIUM LASER LITHOTRIPSY STONE REMOVAL;  Surgeon: Ardis Hughs, MD;  [facility];  Service: Urology;  Laterality: Bilateral;   Social History - Attended [employer] in [place], [place]. Works in Interior and spatial designer. She is single, never married. She lives alone but has a sister near [place].   reports that she quit smoking about 23 years ago. Her smoking use included  cigarettes. She started smoking about 48 years ago. She has a 12.50 pack-year smoking history. She has never used smokeless tobacco. She reports current alcohol use. She reports that she does not use drugs.  Allergies  Allergen Reactions   Other Anaphylaxis    Reaction to tree nuts   Amoxicillin-Pot Clavulanate Other (See Comments)    headache   Ciprofloxacin Nausea Only and Rash   Food Hives and Other (See Comments)    Potato   Keflex [Cephalexin] Rash   Penicillins Rash    Headache. And hives - Dose not tolerate Cephalosporin either   Has patient had a PCN reaction causing immediate rash, facial/tongue/throat swelling, SOB or lightheadedness with hypotension: No Has patient had a PCN reaction causing severe rash involving mucus membranes or skin necrosis: No Has patient had a PCN reaction that required hospitalization: No Has patient had a PCN reaction occurring within the last 10 years: Yes (reaction was May 2020) If all of the above answers are "NO", then may proce   Rocephin [Ceftriaxone] Rash   Tomato Hives    Family History  Problem Relation Age of Onset   Hypertension Father    Diabetes Father    Lung cancer Father    Heart attack Father        MI at age 36   Hypertension Mother    Hyperthyroidism Mother    Heart disease Mother    Heart attack Mother        MI at age 34   Asthma Brother    Hypertension Brother        younger   Heart disease Brother        older   Colon cancer Neg Hx    Esophageal cancer Neg Hx    Stomach cancer Neg Hx    Kidney disease Neg Hx    Liver disease Neg Hx    Unacceptable: Noncontributory, unremarkable, or negative. Acceptable: Family history reviewed and not pertinent (If you reviewed it)  Prior to Admission medications   Medication Sig Start Date End Date Taking? Authorizing Provider  acetaminophen (TYLENOL) 325 MG tablet Take 2 tablets (650 mg total) by mouth every 6 (six) hours as needed for mild pain or  moderate pain. 12/08/16   Waynetta Pean, PA-C  albuterol (PROVENTIL HFA;VENTOLIN HFA) 108 (90 Base) MCG/ACT inhaler Inhale 2 puffs into the lungs every 6 (six) hours as needed for wheezing or shortness of breath. 11/10/16   Debbrah Alar, NP  albuterol (PROVENTIL) (2.5 MG/3ML) 0.083% nebulizer solution Take 3 mLs (2.5 mg total) by nebulization every 6 (six) hours as needed for wheezing or shortness of breath. 01/29/18   Debbrah Alar, NP  calcitRIOL (ROCALTROL) 0.5 MCG capsule  Take 2 mcg by mouth daily.     [provider]  calcium elemental as carbonate (TUMS ULTRA 1000) 400 MG chewable tablet Chew 2,000 mg by mouth 2 (two) times daily.    [provider]  cyanocobalamin (,VITAMIN B-12,) 1000 MCG/ML injection Inject 1 mL (1,000 mcg total) into the skin every 30 (thirty) days. 08/04/18   Debbrah Alar, NP  desoximetasone (TOPICORT) 0.05 % cream Apply topically 2 (two) times daily as needed. Patient taking differently: Apply 1 application topically 2 (two) times daily as needed (for rash).  01/29/17   Debbrah Alar, NP  diclofenac sodium (VOLTAREN) 1 % GEL Apply 2 g topically 4 (four) times daily. Patient not taking: Reported on 11/22/2018 05/05/18   Debbrah Alar, NP  diltiazem (CARDIZEM CD) 120 MG 24 hr capsule Take 1 capsule (120 mg total) by mouth daily. 11/26/18 12/26/18  Antonieta Pert, MD  diphenhydrAMINE (BENADRYL) 25 mg capsule Take 25 mg by mouth every 6 (six) hours as needed for itching.     [provider]  ELIQUIS 5 MG TABS tablet TAKE 1 TABLET BY MOUTH TWO  TIMES DAILY Patient taking differently: Take 5 mg by mouth 2 (two) times daily.  09/16/18   Lelon Perla, MD  EPINEPHrine (EPIPEN 2-PAK) 0.3 mg/0.3 mL IJ SOAJ injection Inject 0.3 mg into the muscle once as needed for anaphylaxis (severe allergic reaction).     [provider]  furosemide (LASIX) 40 MG tablet TAKE 2 TABLETS BY MOUTH  DAILY IN THE MORNING AND 1  TABLET IN THE  EVENING Patient taking differently: Take 40-80 mg by mouth See admin instructions. Take 2 tablets (80 mg) by mouth every morning and 1 tablet (40 mg) every evening 08/18/18   Debbrah Alar, NP  hydrochlorothiazide (HYDRODIURIL) 25 MG tablet TAKE 1 TABLET(25 MG) BY MOUTH DAILY IN THE MORNING Patient taking differently: Take 25 mg by mouth daily with breakfast.  08/13/18   Debbrah Alar, NP  hydrocortisone cream 1 % Apply topically 4 (four) times daily as needed for itching. 10/24/18   Cherylann Ratel A, DO  levocetirizine (XYZAL) 5 MG tablet TAKE 1 TABLET BY MOUTH  EVERY EVENING Patient taking differently: Take 5 mg by mouth every evening.  08/18/18   Debbrah Alar, NP  levothyroxine (SYNTHROID, LEVOTHROID) 100 MCG tablet Take 400 mcg by mouth daily before breakfast.     [provider]  Melatonin 5 MG TABS Take 5 mg by mouth at bedtime as needed (for sleep).     [provider]  montelukast (SINGULAIR) 10 MG tablet Take 1 tablet (10 mg total) by mouth at bedtime. 05/05/18   Debbrah Alar, NP  mupirocin ointment (BACTROBAN) 2 % Place 1 application into the nose 2 (two) times daily. 10/24/18   Cherylann Ratel A, DO  nystatin (MYCOSTATIN) 100000 UNIT/ML suspension TAKE 5 ML BY MOUTH FOUR TIMES DAILY Patient taking differently: Use as directed 5 mLs in the mouth or throat 4 (four) times daily.  08/24/18   Debbrah Alar, NP  nystatin cream (MYCOSTATIN) Apply 1 application topically 2 (two) times daily. 05/05/18   Debbrah Alar, NP  omeprazole (PRILOSEC) 40 MG capsule TAKE 1 CAPSULE BY MOUTH  DAILY Patient taking differently: Take 40 mg by mouth daily.  08/18/18   Debbrah Alar, NP  ondansetron (ZOFRAN) 4 MG tablet Take 1 tablet (4 mg total) by mouth every 8 (eight) hours as needed for nausea or vomiting. 12/30/17   Debbrah Alar, NP  potassium chloride 20 MEQ/15ML (10%)  SOLN Take 15 mLs (20 mEq total) by mouth 2 (two) times daily. Patient taking  differently: Take 20 mEq by mouth daily.  10/12/18   Debbrah Alar, NP  SYMBICORT 160-4.5 MCG/ACT inhaler INHALE 2 PUFFS BY MOUTH TWO TIMES DAILY Patient taking differently: Inhale 2 puffs into the lungs 2 (two) times a day.  06/17/18   Debbrah Alar, NP  Syringe/Needle, Disp, (SYRINGE 3CC/20GX1") 20G X 1" 3 ML MISC Use monthly as directed 08/04/18   Debbrah Alar, NP  traMADol (ULTRAM) 50 MG tablet TAKE 1 TABLET(50 MG) BY MOUTH EVERY 12 HOURS AS NEEDED Patient taking differently: Take 50 mg by mouth every 12 (twelve) hours as needed for moderate pain.  10/13/18   Trula Slade, DPM  Vitamin D, Ergocalciferol, (DRISDOL) 1.25 MG (50000 UT) CAPS capsule Take 1 capsule (50,000 Units total) by mouth every Monday, Wednesday, and Friday. 05/31/18   Debbrah Alar, NP  zafirlukast (ACCOLATE) 20 MG tablet TAKE 1 TABLET BY MOUTH TWO  TIMES DAILY BEFORE MEALS Patient taking differently: Take 20 mg by mouth 2 (two) times daily before a meal.  08/18/18   Debbrah Alar, NP    Physical Exam: Vitals:   12/13/18 0100 12/13/18 0426 12/13/18 0500 12/13/18 0500  BP: 127/64 125/66  116/63  Pulse: 89 78  75  Resp: 15 16    Temp:  97.6 F (36.4 C)  98.7 F (37.1 C)  TempSrc:  Oral  Oral  SpO2: 95% 94%  94%  Weight:   107.5 kg   Height:   5\' 4"  (1.626 m)     Constitutional: NAD, calm, comfortable Vitals:   12/13/18 0100 12/13/18 0426 12/13/18 0500 12/13/18 0500  BP: 127/64 125/66  116/63  Pulse: 89 78  75  Resp: 15 16    Temp:  97.6 F (36.4 C)  98.7 F (37.1 C)  TempSrc:  Oral  Oral  SpO2: 95% 94%  94%  Weight:   107.5 kg   Height:   5\' 4"  (1.626 m)    Eyes: PERRL, lids and conjunctivae normal ENMT: Mucous membranes are moist. Posterior pharynx clear of any exudate or lesions.Normal dentition.  Neck: normal, supple, no masses, no thyromegaly Respiratory: clear to auscultation bilaterally, no wheezing, no crackles. Normal respiratory effort. No accessory muscle use.    Cardiovascular: Regular rate and rhythm, no murmurs / rubs / gallops. No extremity edema. 2+ pedal pulses. No carotid bruits.  Abdomen: no tenderness, no masses palpated. No hepatosplenomegaly. Bowel sounds positive.  Musculoskeletal: no clubbing / cyanosis. No joint deformity upper and lower extremities. Good ROM, no contractures. Normal muscle tone.  Skin: no rashes, lesions, ulcers. No induration Neurologic: CN 2-12 grossly intact. Sensation intact, DTR normal. Strength 5/5 in all 4.  Psychiatric: Normal judgment and insight. Alert and oriented x 3. Normal mood.     Labs on Admission: I have personally reviewed following labs and imaging studies  CBC: Recent Labs  Lab 12/12/18 2140  WBC 17.1*  NEUTROABS 15.7*  HGB 12.3  HCT 40.8  MCV 84.5  PLT PLATELET CLUMPS NOTED ON SMEAR, UNABLE TO ESTIMATE   Basic Metabolic Panel: Recent Labs  Lab 12/12/18 2140  NA 134*  K 2.7*  CL 90*  CO2 29  GLUCOSE 117*  BUN 20  CREATININE 0.90  CALCIUM 9.5   GFR: Estimated Creatinine Clearance: 78.5 mL/min (by C-G formula based on SCr of 0.9 mg/dL). Liver Function Tests: Recent Labs  Lab 12/12/18 2140  AST 21  ALT 33  ALKPHOS  115  BILITOT 1.2  PROT 7.5  ALBUMIN 3.2*   No results for input(s): LIPASE, AMYLASE in the last 168 hours. No results for input(s): AMMONIA in the last 168 hours. Coagulation Profile: No results for input(s): INR, PROTIME in the last 168 hours. Cardiac Enzymes: No results for input(s): CKTOTAL, CKMB, CKMBINDEX, TROPONINI in the last 168 hours. BNP (last 3 results) No results for input(s): PROBNP in the last 8760 hours. HbA1C: No results for input(s): HGBA1C in the last 72 hours. CBG: No results for input(s): GLUCAP in the last 168 hours. Lipid Profile: No results for input(s): CHOL, HDL, LDLCALC, TRIG, CHOLHDL, LDLDIRECT in the last 72 hours. Thyroid Function Tests: No results for input(s): TSH, T4TOTAL, FREET4, T3FREE, THYROIDAB in the last 72  hours. Anemia Panel: No results for input(s): VITAMINB12, FOLATE, FERRITIN, TIBC, IRON, RETICCTPCT in the last 72 hours. Urine analysis:    Component Value Date/Time   COLORURINE YELLOW 12/13/2018 0102   APPEARANCEUR CLOUDY (A) 12/13/2018 0102   LABSPEC 1.012 12/13/2018 0102   LABSPEC 1.010 06/09/2007 1500   PHURINE 6.0 12/13/2018 0102   GLUCOSEU NEGATIVE 12/13/2018 0102   GLUCOSEU NEGATIVE 02/16/2018 1202   HGBUR SMALL (A) 12/13/2018 0102   HGBUR negative 05/09/2010 1026   BILIRUBINUR NEGATIVE 12/13/2018 0102   BILIRUBINUR N 05/01/2016 1104   BILIRUBINUR Negative 06/09/2007 1500   KETONESUR NEGATIVE 12/13/2018 0102   PROTEINUR 30 (A) 12/13/2018 0102   UROBILINOGEN 0.2 02/16/2018 1202   NITRITE POSITIVE (A) 12/13/2018 0102   LEUKOCYTESUR LARGE (A) 12/13/2018 0102   LEUKOCYTESUR Negative 06/09/2007 1500    Radiological Exams on Admission: Dg Chest Portable 1 View  Result Date: 12/12/2018 CLINICAL DATA:  63 year old female with weakness. EXAM: PORTABLE CHEST 1 VIEW COMPARISON:  Chest radiograph dated 11/21/2018 FINDINGS: There is no focal consolidation, pleural effusion, or pneumothorax. Stable mild cardiomegaly. No acute osseous pathology. Thyroidectomy clips noted. IMPRESSION: No active disease. Electronically Signed   By: Anner Crete M.D.   On: 12/12/2018 22:43   Ct Renal Stone Study  Result Date: 12/12/2018 CLINICAL DATA:  Flank pain EXAM: CT ABDOMEN AND PELVIS WITHOUT CONTRAST TECHNIQUE: Multidetector CT imaging of the abdomen and pelvis was performed following the standard protocol without IV contrast. COMPARISON:  November 21, 2018 FINDINGS: Lower chest: The lung bases are clear. The heart size is normal. Hepatobiliary: There is decreased hepatic attenuation suggestive of hepatic steatosis. The liver contour somewhat nodular. Normal gallbladder.There is no biliary ductal dilation. Pancreas: Normal contours without ductal dilatation. No peripancreatic fluid collection. Spleen:  The spleen is enlarged measuring at least 13 cm. Adrenals/Urinary Tract: --Adrenal glands: No adrenal hemorrhage. --Right kidney/ureter: There are multiple nonobstructing stones measuring up to approximately 1 cm. --Left kidney/ureter: There is fat stranding about the left kidney with mild left-sided collecting system dilatation. There are multiple nonobstructing stones in the lower pole the left kidney measuring up to approximately 8 mm. --Urinary bladder: There are new stones in the dependent portion of the urinary bladder. There is a punctate focus of gas within the urinary bladder. Stomach/Bowel: --Stomach/Duodenum: No hiatal hernia or other gastric abnormality. Normal duodenal course and caliber. --Small bowel: No dilatation or inflammation. --Colon: No focal abnormality. --Appendix: Not visualized. No right lower quadrant inflammation or free fluid. Vascular/Lymphatic: Atherosclerotic calcification is present within the non-aneurysmal abdominal aorta, without hemodynamically significant stenosis. There is likely moderate to high-grade stenosis involving the proximal left common iliac artery secondary to atherosclerotic disease --No retroperitoneal lymphadenopathy. --No mesenteric lymphadenopathy. --No pelvic or inguinal lymphadenopathy.  Reproductive: Unremarkable Other: No ascites or free air. The abdominal wall is normal. Musculoskeletal. Multilevel degenerative disc disease and facet arthrosis. No bony spinal canal stenosis. IMPRESSION: 1. New fat stranding and free fluid about the left kidney with mild left-sided collecting system dilatation as well as new stones in the dependent portion of the urinary bladder is most consistent with sequela of a recently passed nephrolith on the left. 2. Bilateral nephrolithiasis as detailed above. 3. Hepatic steatosis. The contour of the left hepatic lobe appears nodular, raising concern for underlying cirrhosis. The spleen is enlarged suggestive of underlying portal  hypertension. Electronically Signed   By: Constance Holster M.D.   On: 12/12/2018 23:10    EKG: Independently reviewed. NSR with PVCs, no acute changes  Assessment/Plan Active Problems:   Pyelonephritis   Hypothyroidism   Essential hypertension   Asthma   GERD   Hypokalemia   Vitamin D deficiency     1. Pyelonephritis with leukocytosis. She has nephrolithiasis with multiple admissions associated with passing stones. She has had multiple episodes of pyelonephritis which all grew e. Coli that was pan sensitive, including TMP/SMX. Now with recurrence.She did receive IV Rocephin in the ED Plan Observation admission for IV hydration   Antibiotic Rx with TMP/SMX DS  F/u CBC in 24 hrs  Post discharge she should keep her appointment with Dr. Louis Meckel for Urology  2. Hypokalemia - a chronic recurring problem. She claims adherence to her home regimen of K replacement. She did receive IV potassium replacement in the ED Plan -  Resume home dosing of potassium  F/u Bmet in 24 hrs   3. Hypertension - stable BP in ED. Will continue home meds.  4. Hyppocalemia post - parathyroidectomy. Plan Continue cholecalciferol at home dose  Will check serum calcium level.   5. Impaired ambulation - chart review did not reveal any work up for this problem in the last 18 months. Patient admits that it is becoming more difficult to ambulate and drive. Problem is multi-factorial including obesity, probable DJD, deformity of both feet but worse on the right with missing 5th digit. Patient does live alone/ Plan Repeat PT evaluation to assess whether she is safe to be at home and to determine PT needs    DVT prophylaxis: eliquis (Lovenox/Heparin/SCD's/anticoagulated/None (if comfort care) Code Status: DNR (Full/Partial (specify details) Family Communication: Spole with patient re: dx and plan of care to which she agreed. She did not have anyone that she wanted contacted.  (Specify name, relationship. Do not  write "discussed with patient". Specify tel # if discussed over the phone) Disposition Plan: Home in 48 hrs (specify when and where you expect patient to be discharged) Consults called: none (with names) Admission status: observation. (inpatient / obs / tele / medical floor / SDU)   Adella Hare MD [facility] Pager [phone]  If 7PM-7AM, please contact night-coverage [email or web address] Password [email or web address]  12/13/2018, 6:11 AM

## 2018-12-13 NOTE — Progress Notes (Addendum)
Patient admitted after midnight. Briefly admitted with hypokalemia, nephrolithtiasis and pyelonephritis. Max temp 100.9, leukocytosis, hemodynamically stable and non-toxic appearing. Her biggest concern this am is her heart healthy/carb modified diet.   A/P 1. Pyelonephritis with leukocytosis. Now with recurrence. She did receive IV Rocephin in the ED. Max temp 100.9.  -continue oral antibiotics.  -track leukocytosis -gentle Iv fluids - Dr. Louis Meckel for Urology  2. Hypokalemia - a chronic recurring problem. Recieved IV potassium replacement in the ED Plan -   Resume home dosing of potassium             F/u [facility] this am              3. Hypertension - stable BP in ED. Home meds include HCTZ, lasix and cardizem. -hold lasix and hctz -continue cardizem -monitor  4. Hyppocalemia post - parathyroidectomy. Plan     Continue cholecalciferol at home dose             Follow serum calcium level.   5. Impaired ambulation - chart review did not reveal any work up for this problem in the last 18 months. Patient admits that it is becoming more difficult to ambulate and drive. Problem is multi-factorial including obesity, probable DJD, deformity of both feet but worse on the right with missing 5th digit. Patient does live alone/ Plan     Repeat PT evaluation to assess whether she is safe to be at home and to determine PT needs  Dyanne Carrel, NP               Patient c/o falls after severe knee pain.  Also found to have UTI/pyelonephritis. PO abx.  voltaren gel and PRN pain medications if tramadol not working.  Needs aggressive repletion of her electrolytes as she has a history of a fib and pain control.  Not eating well so will change diet to regular.  May need IVF and/or abx change if appetite does not improve.  Will change to inpatient Eulogio Bear DO

## 2018-12-13 NOTE — Evaluation (Signed)
Physical Therapy Evaluation Patient Details Name: [name] MRN: [id number] DOB: 07/29/1955 Today's Date: 12/13/2018   History of Present Illness  63 y.o. female with medical history significant for iron deficiency anemia, pernicious anemia, gastric bypass and absorption, chronic atrial fibrillation on Eliquis who presents with weakness, L LE pain and fall.  Clinical Impression  Patient received in recliner, agrees to PT evaluation. Patient reports she has had two recent falls due to weakness. Daughter is nearby. Patient is able to perform sit to stand transfer with min guard assist. Ambulated 25 feet in room with RW and min guard. Limited distance due to reported fatigue. Patient able to participate in seated LE strengthening exercises. Patient will benefit from continued skilled PT to address her weakness, fall prevention, safety with mobility and to improve activity tolerance.       Follow Up Recommendations Home health PT    Equipment Recommendations  None recommended by PT    Recommendations for Other Services       Precautions / Restrictions Precautions Precautions: Fall Restrictions Weight Bearing Restrictions: No      Mobility  Bed Mobility               General bed mobility comments: not assessed, patient up in recliner and returned to recliner  Transfers Overall transfer level: Modified independent Equipment used: Rolling walker (2 wheeled)                Ambulation/Gait Ambulation/Gait assistance: Min guard Gait Distance (Feet): 25 Feet Assistive device: Rolling walker (2 wheeled) Gait Pattern/deviations: Step-through pattern;Decreased step length - right;Decreased step length - left;Decreased stride length Gait velocity: decreased   General Gait Details: decreased B foot clearance  Stairs            Wheelchair Mobility    Modified Rankin (Stroke Patients Only)       Balance Overall balance assessment: Modified Independent                                           Pertinent Vitals/Pain Pain Assessment: Faces Faces Pain Scale: Hurts little more Pain Location: L LE with palpation Pain Descriptors / Indicators: Sore Pain Intervention(s): Monitored during session    Home Living Family/patient expects to be discharged to:: Private residence   Available Help at Discharge: Family;Available PRN/intermittently Type of Home: House Home Access: Ramped entrance     Home Layout: Two level;Able to live on main level with bedroom/bathroom Home Equipment: Walker - 4 wheels;Walker - standard;Shower seat;Grab bars - tub/shower      Prior Function Level of Independence: Independent with assistive device(s)         Comments: Uses Rollator in the community and uses a standard walker for increased support in the shower     Hand Dominance   Dominant Hand: Right    Extremity/Trunk Assessment   Upper Extremity Assessment Upper Extremity Assessment: Generalized weakness    Lower Extremity Assessment Lower Extremity Assessment: Generalized weakness    Cervical / Trunk Assessment Cervical / Trunk Assessment: Normal  Communication   Communication: No difficulties  Cognition Arousal/Alertness: Awake/alert Behavior During Therapy: WFL for tasks assessed/performed Overall Cognitive Status: Within Functional Limits for tasks assessed  General Comments      Exercises Total Joint Exercises Ankle Circles/Pumps: AROM;10 reps;Both Heel Slides: AAROM;10 reps;Both Hip ABduction/ADduction: AAROM;Both;10 reps Long Arc Quad: AROM;Both;10 reps   Assessment/Plan    PT Assessment Patient needs continued PT services  PT Problem List Decreased strength;Decreased mobility;Decreased activity tolerance;Pain       PT Treatment Interventions Therapeutic activities;Gait training;Therapeutic exercise;Patient/family education;Functional mobility  training    PT Goals (Current goals can be found in the Care Plan section)  Acute Rehab PT Goals Patient Stated Goal: to return home PT Goal Formulation: With patient Time For Goal Achievement: 12/18/18 Potential to Achieve Goals: Good    Frequency Min 3X/week   Barriers to discharge Decreased caregiver support      Co-evaluation               AM-PAC PT "6 Clicks" Mobility  Outcome Measure Help needed turning from your back to your side while in a flat bed without using bedrails?: A Little Help needed moving from lying on your back to sitting on the side of a flat bed without using bedrails?: A Little Help needed moving to and from a bed to a chair (including a wheelchair)?: A Little Help needed standing up from a chair using your arms (e.g., wheelchair or bedside chair)?: A Little Help needed to walk in hospital room?: A Little Help needed climbing 3-5 steps with a railing? : A Lot 6 Click Score: 17    End of Session Equipment Utilized During Treatment: Gait belt Activity Tolerance: Patient limited by fatigue Patient left: in chair;with call bell/phone within reach Nurse Communication: Mobility status PT Visit Diagnosis: Muscle weakness (generalized) (M62.81);Unsteadiness on feet (R26.81);Difficulty in walking, not elsewhere classified (R26.2);Pain Pain - Right/Left: Left Pain - part of body: Leg    Time: 1340-1353 PT Time Calculation (min) (ACUTE ONLY): 13 min   Charges:   PT Evaluation $PT Eval Low Complexity: 1 Low PT Treatments $Therapeutic Activity: 8-22 mins        Shekita Boyden, PT, GCS 12/13/18,2:06 PM

## 2018-12-13 NOTE — TOC Initial Note (Signed)
Transition of Care [facility]) - Initial/Assessment Note    Patient Details  Name: [name] MRN: [id number] Date of Birth: 06/27/1955  [facility]) CM/SW Contact:    Marilu Favre, RN Phone Number: 12/13/2018, 3:20 PM  Clinical Narrative:                 Confirmed face sheet information. Patient from home alone, however, states she has family close by to assist her. She wants [facility] for HHPT. Referral given to and accepted by [facility] with Alvis Lemmings.   Patient atready has a walker at home and does not want a 3 in1. Patient has transportation home at discharge.  Expected Discharge Plan: [place] Barriers to Discharge: Continued Medical Work up   Patient Goals and CMS Choice Patient states their goals for this hospitalization and ongoing recovery are:: to go home CMS Medicare.gov Compare Post Acute Care list provided to:: Patient Choice offered to / list presented to : Patient  Expected Discharge Plan and Services Expected Discharge Plan: [place]   Discharge Planning Services: CM Consult Post Acute Care Choice: [place] arrangements for the past 2 months: Single Family Home                 DME Arranged: N/A         HH Arranged: PT HH Agency: [place] Date [place]: 12/13/18 Time HH Agency Contacted: [id number] Representative spoke with at [place]: cory  Prior Living Arrangements/Services Living arrangements for the past 2 months: [place] with:: Self Patient language and need for interpreter reviewed:: Yes Do you feel safe going back to the place where you live?: Yes      Need for Family Participation in Patient Care: Yes (Comment) Care giver support system in place?: Yes (comment) Current home services: DME Criminal Activity/Legal Involvement Pertinent to Current Situation/Hospitalization: No - Comment as needed  Activities of Daily Living Home Assistive  Devices/Equipment: Walker (specify type) ADL Screening (condition at time of admission) Patient's cognitive ability adequate to safely complete daily activities?: Yes Is the patient deaf or have difficulty hearing?: No Does the patient have difficulty seeing, even when wearing glasses/contacts?: No Does the patient have difficulty concentrating, remembering, or making decisions?: No Patient able to express need for assistance with ADLs?: Yes Does the patient have difficulty dressing or bathing?: Yes Independently performs ADLs?: No Communication: Independent Dressing (OT): Needs assistance Is this a change from baseline?: Pre-admission baseline Grooming: Needs assistance Is this a change from baseline?: Pre-admission baseline Feeding: Independent Bathing: Needs assistance Is this a change from baseline?: Pre-admission baseline Toileting: Needs assistance Is this a change from baseline?: Pre-admission baseline In/Out Bed: Needs assistance Is this a change from baseline?: Pre-admission baseline Walks in Home: Needs assistance Is this a change from baseline?: Pre-admission baseline Does the patient have difficulty walking or climbing stairs?: Yes Weakness of Legs: Both Weakness of Arms/Hands: Both  Permission Sought/Granted   Permission granted to share information with : Yes, Verbal Permission Granted     Permission granted to share info w AGENCY: [facility]        Emotional Assessment Appearance:: Appears stated age Attitude/Demeanor/Rapport: Engaged Affect (typically observed): Accepting Orientation: : Oriented to Self, Oriented to Place, Oriented to  Time, Oriented to Situation Alcohol / Substance Use: Not Applicable Psych Involvement: No (comment)  Admission diagnosis:  Hypokalemia [E87.6] Nephrolithiasis [N20.0] Generalized weakness [R53.1] Urinary tract infection without hematuria,  site unspecified [N39.0] Patient Active Problem List   Diagnosis Date Noted  . Pressure  injury of skin 12/13/2018  . UTI (urinary tract infection) 12/13/2018  . Pyelonephritis 11/21/2018  . Sepsis due to Escherichia coli (E. coli) ([place]) 11/28/2017  . E coli bacteremia 11/28/2017  . Sepsis ([place]) 11/24/2017  . Hypokalemia 12/10/2016  . Hypocalcemia 12/10/2016  . Recurrent falls 12/09/2016  . Right ureteral stone 07/17/2014  . Osteomyelitis ([place]) 06/14/2014  . Pernicious anemia 02/20/2014  . Reflux esophagitis 02/02/2014  . Edema 11/23/2013  . Foot pain 03/31/2013  . Endometrial adenocarcinoma ([place]) 02/21/2013  . Trochanteric bursitis of left hip 09/13/2012  . Muscle cramps 08/12/2012  . Fatty liver 01/07/2012  . Mid back pain 09/30/2011  . Weight gain 07/02/2011  . Nonspecific abnormal unspecified cardiovascular function study 04/30/2011  . Hx of papillary thyroid carcinoma 04/07/2011  . Low back pain 04/01/2011  . Atrial fibrillation ([place]) 02/05/2011  . Hypoparathyroidism ([place]) 01/07/2011  . GERD 05/22/2009  . Leukocytosis 03/24/2009  . OBESITY, MORBID 12/20/2008  . RHINOSINUSITIS, RECURRENT 09/07/2008  . Vitamin D deficiency 05/10/2007  . Iron deficiency anemia secondary to malabsorption  05/10/2007  . Hypothyroidism 01/05/2007  . Essential hypertension 01/05/2007  . ALLERGIC RHINITIS 01/05/2007  . Asthma 01/05/2007   PCP:  Debbrah Alar, NP Pharmacy:   [id number] - [place], [place] - [address] [place] [address] [place] [id number] Phone: [phone] Fax: [phone]  [place] [facility] [address] [place] [address] [place] [address] Phone: [phone] Fax: [phone]     Social Determinants of Health (SDOH) Interventions    Readmission Risk Interventions No flowsheet data found.

## 2018-12-14 ENCOUNTER — Other Ambulatory Visit: Payer: 59

## 2018-12-14 ENCOUNTER — Encounter (HOSPITAL_COMMUNITY): Payer: Self-pay

## 2018-12-14 DIAGNOSIS — R531 Weakness: Secondary | ICD-10-CM | POA: Diagnosis not present

## 2018-12-14 DIAGNOSIS — N390 Urinary tract infection, site not specified: Secondary | ICD-10-CM | POA: Diagnosis not present

## 2018-12-14 LAB — CBC
HCT: 32.6 % — ABNORMAL LOW (ref 36.0–46.0)
Hemoglobin: 9.8 g/dL — ABNORMAL LOW (ref 12.0–15.0)
MCH: 25.5 pg — ABNORMAL LOW (ref 26.0–34.0)
MCHC: 30.1 g/dL (ref 30.0–36.0)
MCV: 84.9 fL (ref 80.0–100.0)
Platelets: UNDETERMINED 10*3/uL (ref 150–400)
RBC: 3.84 MIL/uL — ABNORMAL LOW (ref 3.87–5.11)
RDW: 16.7 % — ABNORMAL HIGH (ref 11.5–15.5)
WBC: 8.3 10*3/uL (ref 4.0–10.5)
nRBC: 0 % (ref 0.0–0.2)

## 2018-12-14 LAB — BASIC METABOLIC PANEL
Anion gap: 11 (ref 5–15)
BUN: 16 mg/dL (ref 8–23)
CO2: 26 mmol/L (ref 22–32)
Calcium: 8.5 mg/dL — ABNORMAL LOW (ref 8.9–10.3)
Chloride: 96 mmol/L — ABNORMAL LOW (ref 98–111)
Creatinine, Ser: 0.91 mg/dL (ref 0.44–1.00)
GFR calc Af Amer: >60 mL/min (ref >60)
GFR calc non Af Amer: >60 mL/min (ref >60)
Glucose, Bld: 89 mg/dL (ref 70–99)
Potassium: 3.6 mmol/L (ref 3.5–5.1)
Sodium: 133 mmol/L — ABNORMAL LOW (ref 135–145)

## 2018-12-14 LAB — PLATELET BY CITRATE: Platelet CT in Citrate: UNDETERMINED

## 2018-12-14 MED ORDER — SULFAMETHOXAZOLE-TRIMETHOPRIM 800-160 MG PO TABS: 1.0000 | ORAL_TABLET | Freq: Two times a day (BID) | ORAL | 0 refills | Status: AC

## 2018-12-14 MED ORDER — SENNA 8.6 MG PO TABS: 1.0000 | ORAL_TABLET | Freq: Two times a day (BID) | ORAL | 0 refills | Status: DC

## 2018-12-14 NOTE — Plan of Care (Signed)
?  Problem: Activity: ?Goal: Risk for activity intolerance will decrease ?Outcome: Progressing ?  ?Problem: Nutrition: ?Goal: Adequate nutrition will be maintained ?Outcome: Progressing ?  ?Problem: Elimination: ?Goal: Will not experience complications related to urinary retention ?Outcome: Progressing ?  ?Problem: Pain Managment: ?Goal: General experience of comfort will improve ?Outcome: Progressing ?  ?

## 2018-12-14 NOTE — Discharge Summary (Signed)
Physician Discharge Summary  [name] KDX:[id number] DOB: November 18, 1955 DOA: 12/12/2018  PCP: Debbrah Alar, NP  Admit date: 12/12/2018 Discharge date: 12/14/2018  Time spent: 45 minutes  Recommendations for Outpatient Follow-up:  1. Follow up with urology as scheduled next week. Recommend bmet to check potassium level 2. Home health PT 3. Take medications as prescribed   Discharge Diagnoses:  Active Problems:   Essential hypertension   Hypokalemia   Pyelonephritis   Hypothyroidism   OBESITY, MORBID   Asthma   Pressure injury of skin   Vitamin D deficiency   GERD   UTI (urinary tract infection)   Discharge Condition: stable  Diet recommendation: heart healthy  Filed Weights   12/13/18 0500  Weight: 107.5 kg    History of present illness:  [name] is a 63 y.o. female with medical history significant of multiple episodes of nephrolithiasis and spontaneous passage of stones, iron deficiency anemia due to poor iron absorption after bariatric surgery, history of bursitis left hip,  atrial fibrillation, history of COPD presented 7/20 with flank pain and worsening generalized weakness.  She had significant problems with her feet including amputation of the fifth digit of the right foot and severe deformities of her great toes.  This interfered with her ability to ambulate.  She also suffers with fibromyalgia and generalized weakness. She has had 3 hospitalizations for pyelonephritis and bacteremia,most recently November 25, 2018.  She has consistently grown E. coli which has been pan sensitive.  Her last 2 infections were successfully treated with Bactrim.  For the three days prior to admission she  had increasing flank pain, increasing weakness and clouded cognition.These condition precipitated a fall and EMS was called to assist her. Because of persistent symptoms, similar to previous infections, she presented to the [facility] ED.   Hospital Course:  1. Pyelonephritis with  leukocytosis. Now with recurrence. She did receive IV Rocephin in the ED. Max temp 100.9. antibiotics changed to Bactrim DS. Urine culture with gram neg rods. At discharge she is afebrile no leukocytosis. Will discharge with antibiotics to complete 7 days. She has appointment 7 days from discharge with urology.   2. Hypokalemia - a chronic recurring problem. Resolved at discharge. Home meds include supplement. Recommend bmet 1 week to check potassium level  3. Hypertension - stable BP. Home meds include HCTZ, lasix and cardizem. Lasix and hctz held initially. Resume at discharge.   4. Hyppocalemia post - parathyroidectomy.Continue cholecalciferol at home dose.   5. Impaired ambulation - Patient admits that it is becoming more difficult to ambulate and drive. Problem is multi-factorial including obesity, probable DJD, deformity of both feet but worse on the right with missing 5th digit. Patient does live alone/ Evaluated by PT who recommend Home Health PT   Procedures:    Consultations:    Discharge Exam: Vitals:   12/14/18 0436 12/14/18 0834  BP: (!) 121/56 136/66  Pulse: 75   Resp: 17   Temp: 98.2 F (36.8 C)   SpO2: 97%     General: awake alert sitting in chair in no acute distress Cardiovascular: rrr no mgr no LE edema Respiratory: normal effort BS clear bilaterally no wheeze  Discharge Instructions   Discharge Instructions    Call MD for:  extreme fatigue   Complete by: As directed    Call MD for:  persistant dizziness or light-headedness   Complete by: As directed    Call MD for:  temperature >100.4   Complete by: As directed  Diet - low sodium heart healthy   Complete by: As directed    Discharge instructions   Complete by: As directed    Take medications as prescribed Follow up with urology as scheduled next week Home health PT ordered   Increase activity slowly   Complete by: As directed      Allergies as of 12/14/2018      Reactions    Other Anaphylaxis   Reaction to tree nuts   Amoxicillin-pot Clavulanate Other (See Comments)   headache   Ciprofloxacin Nausea Only, Rash   Food Hives, Other (See Comments)   Potato   Keflex [cephalexin] Rash   Penicillins Rash   Headache. And hives - Dose not tolerate Cephalosporin either   Has patient had a PCN reaction causing immediate rash, facial/tongue/throat swelling, SOB or lightheadedness with hypotension: No Has patient had a PCN reaction causing severe rash involving mucus membranes or skin necrosis: No Has patient had a PCN reaction that required hospitalization: No Has patient had a PCN reaction occurring within the last 10 years: Yes (reaction was May 2020) If all of the above answers are "NO", then may proce   Rocephin [ceftriaxone] Rash   Tomato Hives      Medication List    TAKE these medications   acetaminophen 325 MG tablet Commonly known as: TYLENOL Take 2 tablets (650 mg total) by mouth every 6 (six) hours as needed for mild pain or moderate pain.   albuterol 108 (90 Base) MCG/ACT inhaler Commonly known as: VENTOLIN HFA Inhale 2 puffs into the lungs every 6 (six) hours as needed for wheezing or shortness of breath.   albuterol (2.5 MG/3ML) 0.083% nebulizer solution Commonly known as: PROVENTIL Take 3 mLs (2.5 mg total) by nebulization every 6 (six) hours as needed for wheezing or shortness of breath.   calcitRIOL 0.5 MCG capsule Commonly known as: ROCALTROL Take 2 mcg by mouth daily.   cyanocobalamin 1000 MCG/ML injection Commonly known as: (VITAMIN B-12) Inject 1 mL (1,000 mcg total) into the skin every 30 (thirty) days.   desoximetasone 0.05 % cream Commonly known as: TOPICORT Apply topically 2 (two) times daily as needed. What changed:   how much to take  reasons to take this   diclofenac sodium 1 % Gel Commonly known as: VOLTAREN Apply 2 g topically 4 (four) times daily.   diltiazem 120 MG 24 hr capsule Commonly known as: CARDIZEM  CD Take 1 capsule (120 mg total) by mouth daily.   diphenhydrAMINE 25 mg capsule Commonly known as: BENADRYL Take 25 mg by mouth every 6 (six) hours as needed for itching.   Eliquis 5 MG Tabs tablet Generic drug: apixaban TAKE 1 TABLET BY MOUTH TWO  TIMES DAILY What changed: how much to take   EpiPen 2-Pak 0.3 mg/0.3 mL Soaj injection Generic drug: EPINEPHrine Inject 0.3 mg into the muscle once as needed for anaphylaxis (severe allergic reaction).   furosemide 40 MG tablet Commonly known as: LASIX TAKE 2 TABLETS BY MOUTH  DAILY IN THE MORNING AND 1  TABLET IN THE EVENING What changed:   how much to take  how to take this  when to take this  additional instructions   hydrochlorothiazide 25 MG tablet Commonly known as: HYDRODIURIL TAKE 1 TABLET(25 MG) BY MOUTH DAILY IN THE MORNING What changed: See the new instructions.   hydrocortisone cream 1 % Apply topically 4 (four) times daily as needed for itching.   levocetirizine 5 MG tablet Commonly known as: XYZAL  TAKE 1 TABLET BY MOUTH  EVERY EVENING   levothyroxine 100 MCG tablet Commonly known as: SYNTHROID Take 400 mcg by mouth daily before breakfast.   Melatonin 5 MG Tabs Take 5 mg by mouth at bedtime as needed (for sleep).   montelukast 10 MG tablet Commonly known as: SINGULAIR Take 1 tablet (10 mg total) by mouth at bedtime.   mupirocin ointment 2 % Commonly known as: BACTROBAN Place 1 application into the nose 2 (two) times daily.   nystatin 100000 UNIT/ML suspension Commonly known as: MYCOSTATIN TAKE 5 ML BY MOUTH FOUR TIMES DAILY What changed: See the new instructions.   nystatin cream Commonly known as: MYCOSTATIN Apply 1 application topically 2 (two) times daily.   omeprazole 40 MG capsule Commonly known as: PRILOSEC TAKE 1 CAPSULE BY MOUTH  DAILY   ondansetron 4 MG tablet Commonly known as: Zofran Take 1 tablet (4 mg total) by mouth every 8 (eight) hours as needed for nausea or vomiting.    potassium chloride 20 MEQ/15ML (10%) Soln Take 15 mLs (20 mEq total) by mouth 2 (two) times daily. What changed: when to take this   senna 8.6 MG Tabs tablet Commonly known as: SENOKOT Take 1 tablet (8.6 mg total) by mouth 2 (two) times daily.   sulfamethoxazole-trimethoprim 800-160 MG tablet Commonly known as: BACTRIM DS Take 1 tablet by mouth every 12 (twelve) hours for 6 days.   Symbicort 160-4.5 MCG/ACT inhaler Generic drug: budesonide-formoterol INHALE 2 PUFFS BY MOUTH TWO TIMES DAILY What changed: See the new instructions.   SYRINGE 3CC/20GX1" 20G X 1" 3 ML Misc Use monthly as directed   traMADol 50 MG tablet Commonly known as: ULTRAM TAKE 1 TABLET(50 MG) BY MOUTH EVERY 12 HOURS AS NEEDED What changed:   how much to take  how to take this  when to take this  reasons to take this  additional instructions   Tums Ultra 1000 400 MG chewable tablet Generic drug: calcium elemental as carbonate Chew 2,000 mg by mouth 2 (two) times daily.   Vitamin D (Ergocalciferol) 1.25 MG (50000 UT) Caps capsule Commonly known as: DRISDOL Take 1 capsule (50,000 Units total) by mouth every Monday, Wednesday, and Friday.   zafirlukast 20 MG tablet Commonly known as: ACCOLATE TAKE 1 TABLET BY MOUTH TWO  TIMES DAILY BEFORE MEALS What changed: See the new instructions.      Allergies  Allergen Reactions  . Other Anaphylaxis    Reaction to tree nuts  . Amoxicillin-Pot Clavulanate Other (See Comments)    headache  . Ciprofloxacin Nausea Only and Rash  . Food Hives and Other (See Comments)    Potato  . Keflex [Cephalexin] Rash  . Penicillins Rash    Headache. And hives - Dose not tolerate Cephalosporin either   Has patient had a PCN reaction causing immediate rash, facial/tongue/throat swelling, SOB or lightheadedness with hypotension: No Has patient had a PCN reaction causing severe rash involving mucus membranes or skin necrosis: No Has patient had a PCN reaction that  required hospitalization: No Has patient had a PCN reaction occurring within the last 10 years: Yes (reaction was May 2020) If all of the above answers are "NO", then may proce  . Rocephin [Ceftriaxone] Rash  . Tomato Hives   Follow-up Information    Debbrah Alar, NP Follow up in 1 week(s).   Specialty: Internal Medicine Contact information: [address] [place] [address] [phone]  The results of significant diagnostics from this hospitalization (including imaging, microbiology, ancillary and laboratory) are listed below for reference.    Significant Diagnostic Studies: Ct Head Wo Contrast  Result Date: 11/21/2018 CLINICAL DATA:  Confusion, fatigue, vomiting EXAM: CT HEAD WITHOUT CONTRAST TECHNIQUE: Contiguous axial images were obtained from the base of the skull through the vertex without intravenous contrast. COMPARISON:  12/09/2016 FINDINGS: Brain: No evidence of acute infarction, hemorrhage, hydrocephalus, extra-axial collection or mass lesion/mass effect. Mild periventricular white matter hypodensity. Vascular: No hyperdense vessel or unexpected calcification. Skull: Hyperostosis frontalis. Negative for fracture or focal lesion. Sinuses/Orbits: No acute finding. Other: None. IMPRESSION: No acute intracranial pathology. Mild small-vessel white matter disease. Electronically Signed   By: Eddie Candle M.D.   On: 11/21/2018 17:04   Ct Abdomen Pelvis W Contrast  Result Date: 11/21/2018 CLINICAL DATA:  63 y/o F; abdominal pain, back pain with nausea and vomiting, symptoms for 2 days. EXAM: CT ABDOMEN AND PELVIS WITH CONTRAST TECHNIQUE: Multidetector CT imaging of the abdomen and pelvis was performed using the standard protocol following bolus administration of intravenous contrast. CONTRAST:  140mL OMNIPAQUE IOHEXOL 300 MG/ML  SOLN COMPARISON:  10/20/2018 CT abdomen and pelvis. FINDINGS: Lower chest: No acute abnormality. Hepatobiliary: Hepatic  steatosis. No focal liver abnormality is seen. No gallstones, gallbladder wall thickening, or biliary dilatation. Pancreas: Unremarkable. No pancreatic ductal dilatation or surrounding inflammatory changes. Spleen: Normal in size without focal abnormality. Adrenals/Urinary Tract: Adrenal glands are unremarkable. Bilateral nonobstructive nephrolithiasis. No hydronephrosis. Mild hypoenhancement of left kidney in comparison with the right kidney. Normal appearance of the bladder. Stomach/Bowel: Stomach is within normal limits. Stable prominent appendix stump without surrounding inflammatory changes. No evidence of bowel wall thickening, distention, or inflammatory changes. Diverticulosis, no findings of acute diverticulitis. Vascular/Lymphatic: Aortic atherosclerosis. No enlarged abdominal or pelvic lymph nodes. Reproductive: Uterus and bilateral adnexa are unremarkable. IUD well seated in uterine fundus. Other: No abdominal wall hernia or abnormality. No abdominopelvic ascites. Musculoskeletal: No fracture is seen. Advanced spondylosis and mild dextrocurvature of the lumbar spine. Flowing anterior ossification of lower thoracic spine compatible with DISH. IMPRESSION: 1. Mild hypoenhancement of left kidney in comparison with the right kidney. Findings may represent a recently passed stone or possibly infection of the renal collecting system/pyelonephritis. 2. Bilateral nonobstructive nephrolithiasis. 3. Hepatic steatosis. 4. Diverticulosis without findings of acute diverticulitis. 5. Aortic Atherosclerosis (ICD10-I70.0). Electronically Signed   By: Kristine Garbe M.D.   On: 11/21/2018 20:55   Dg Chest Portable 1 View  Result Date: 12/12/2018 CLINICAL DATA:  63 year old female with weakness. EXAM: PORTABLE CHEST 1 VIEW COMPARISON:  Chest radiograph dated 11/21/2018 FINDINGS: There is no focal consolidation, pleural effusion, or pneumothorax. Stable mild cardiomegaly. No acute osseous pathology.  Thyroidectomy clips noted. IMPRESSION: No active disease. Electronically Signed   By: Anner Crete M.D.   On: 12/12/2018 22:43   Dg Chest Port 1 View  Result Date: 11/21/2018 CLINICAL DATA:  Confusion, vomiting, fatigue EXAM: PORTABLE CHEST 1 VIEW COMPARISON:  10/20/2018 FINDINGS: Cardiomegaly. Both lungs are clear. The visualized skeletal structures are unremarkable. IMPRESSION: Cardiomegaly without acute abnormality of the lungs in AP portable projection. Electronically Signed   By: Eddie Candle M.D.   On: 11/21/2018 17:02   Ct Renal Stone Study  Result Date: 12/12/2018 CLINICAL DATA:  Flank pain EXAM: CT ABDOMEN AND PELVIS WITHOUT CONTRAST TECHNIQUE: Multidetector CT imaging of the abdomen and pelvis was performed following the standard protocol without IV contrast. COMPARISON:  November 21, 2018 FINDINGS: Lower chest: The lung  bases are clear. The heart size is normal. Hepatobiliary: There is decreased hepatic attenuation suggestive of hepatic steatosis. The liver contour somewhat nodular. Normal gallbladder.There is no biliary ductal dilation. Pancreas: Normal contours without ductal dilatation. No peripancreatic fluid collection. Spleen: The spleen is enlarged measuring at least 13 cm. Adrenals/Urinary Tract: --Adrenal glands: No adrenal hemorrhage. --Right kidney/ureter: There are multiple nonobstructing stones measuring up to approximately 1 cm. --Left kidney/ureter: There is fat stranding about the left kidney with mild left-sided collecting system dilatation. There are multiple nonobstructing stones in the lower pole the left kidney measuring up to approximately 8 mm. --Urinary bladder: There are new stones in the dependent portion of the urinary bladder. There is a punctate focus of gas within the urinary bladder. Stomach/Bowel: --Stomach/Duodenum: No hiatal hernia or other gastric abnormality. Normal duodenal course and caliber. --Small bowel: No dilatation or inflammation. --Colon: No focal  abnormality. --Appendix: Not visualized. No right lower quadrant inflammation or free fluid. Vascular/Lymphatic: Atherosclerotic calcification is present within the non-aneurysmal abdominal aorta, without hemodynamically significant stenosis. There is likely moderate to high-grade stenosis involving the proximal left common iliac artery secondary to atherosclerotic disease --No retroperitoneal lymphadenopathy. --No mesenteric lymphadenopathy. --No pelvic or inguinal lymphadenopathy. Reproductive: Unremarkable Other: No ascites or free air. The abdominal wall is normal. Musculoskeletal. Multilevel degenerative disc disease and facet arthrosis. No bony spinal canal stenosis. IMPRESSION: 1. New fat stranding and free fluid about the left kidney with mild left-sided collecting system dilatation as well as new stones in the dependent portion of the urinary bladder is most consistent with sequela of a recently passed nephrolith on the left. 2. Bilateral nephrolithiasis as detailed above. 3. Hepatic steatosis. The contour of the left hepatic lobe appears nodular, raising concern for underlying cirrhosis. The spleen is enlarged suggestive of underlying portal hypertension. Electronically Signed   By: Constance Holster M.D.   On: 12/12/2018 23:10    Microbiology: Recent Results (from the past 240 hour(s))  Blood culture (routine x 2)     Status: None (Preliminary result)   Collection Time: 12/12/18 10:23 PM   Specimen: BLOOD  Result Value Ref Range Status   Specimen Description BLOOD RIGHT FOREARM  Final   Special Requests   Final    BOTTLES DRAWN AEROBIC AND ANAEROBIC Blood Culture adequate volume   Culture   Final    NO GROWTH 1 DAY Performed at [facility], [place] [address] [place], [place] [address]    Report Status PENDING  Incomplete  SARS Coronavirus 2 (CEPHEID- Performed in [place] hospital lab), Hosp Order     Status: None   Collection Time: 12/12/18 11:23 PM   Specimen: Nasopharyngeal  Swab  Result Value Ref Range Status   SARS Coronavirus 2 NEGATIVE NEGATIVE Final    Comment: (NOTE) If result is NEGATIVE SARS-CoV-2 target nucleic acids are NOT DETECTED. The SARS-CoV-2 RNA is generally detectable in upper and lower  respiratory specimens during the acute phase of infection. The lowest  concentration of SARS-CoV-2 viral copies this assay can detect is 250  copies / mL. A negative result does not preclude SARS-CoV-2 infection  and should not be used as the sole basis for treatment or other  patient management decisions.  A negative result may occur with  improper specimen collection / handling, submission of specimen other  than nasopharyngeal swab, presence of viral mutation(s) within the  areas targeted by this assay, and inadequate number of viral copies  (<250 copies / mL). A negative result  must be combined with clinical  observations, patient history, and epidemiological information. If result is POSITIVE SARS-CoV-2 target nucleic acids are DETECTED. The SARS-CoV-2 RNA is generally detectable in upper and lower  respiratory specimens dur ing the acute phase of infection.  Positive  results are indicative of active infection with SARS-CoV-2.  Clinical  correlation with patient history and other diagnostic information is  necessary to determine patient infection status.  Positive results do  not rule out bacterial infection or co-infection with other viruses. If result is PRESUMPTIVE POSTIVE SARS-CoV-2 nucleic acids MAY BE PRESENT.   A presumptive positive result was obtained on the submitted specimen  and confirmed on repeat testing.  While 2019 novel coronavirus  (SARS-CoV-2) nucleic acids may be present in the submitted sample  additional confirmatory testing may be necessary for epidemiological  and / or clinical management purposes  to differentiate between  SARS-CoV-2 and other Sarbecovirus currently known to infect humans.  If clinically indicated  additional testing with an alternate test  methodology [phone]) is advised. The SARS-CoV-2 RNA is generally  detectable in upper and lower respiratory sp ecimens during the acute  phase of infection. The expected result is Negative. Fact Sheet for Patients:  StrictlyIdeas.no Fact Sheet for Healthcare Providers: BankingDealers.co.za This test is not yet approved or cleared by the [place] FDA and has been authorized for detection and/or diagnosis of SARS-CoV-2 by FDA under an Emergency Use Authorization (EUA).  This EUA will remain in effect (meaning this test can be used) for the duration of the COVID-19 declaration under Section 564(b)(1) of the Act, 21 U.S.C. section 360bbb-3(b)(1), unless the authorization is terminated or revoked sooner. Performed at [facility], [place] [address], [place], [place] [address]   Blood culture (routine x 2)     Status: None (Preliminary result)   Collection Time: 12/13/18 12:40 AM   Specimen: BLOOD  Result Value Ref Range Status   Specimen Description BLOOD RIGHT HAND  Final   Special Requests   Final    BOTTLES DRAWN AEROBIC AND ANAEROBIC Blood Culture adequate volume   Culture   Final    NO GROWTH 1 DAY Performed at [facility], [place] [address] [place], [place] [address]    Report Status PENDING  Incomplete  Urine culture     Status: Abnormal (Preliminary result)   Collection Time: 12/13/18  1:02 AM   Specimen: Urine, Clean Catch  Result Value Ref Range Status   Specimen Description URINE, CLEAN CATCH  Final   Special Requests   Final    NONE Performed at [facility], [place] [address], [place], [place] [address]    Culture >=100,000 COLONIES/mL GRAM NEGATIVE RODS (A)  Final   Report Status PENDING  Incomplete     Labs: Basic Metabolic Panel: Recent Labs  Lab 12/12/18 2140 12/13/18 0944 12/14/18 0559  NA 134* 131* 133*  K 2.7* 3.1* 3.6  CL 90* 94* 96*  CO2 29  26 26   GLUCOSE 117* 99 89  BUN 20 15 16   CREATININE 0.90 0.82 0.91  CALCIUM 9.5 8.4* 8.5*   Liver Function Tests: Recent Labs  Lab 12/12/18 2140  AST 21  ALT 33  ALKPHOS 115  BILITOT 1.2  PROT 7.5  ALBUMIN 3.2*   No results for input(s): LIPASE, AMYLASE in the last 168 hours. No results for input(s): AMMONIA in the last 168 hours. CBC: Recent Labs  Lab 12/12/18 2140 12/13/18 0944 12/14/18 0559  WBC 17.1*  12.7* 8.3  NEUTROABS 15.7*  --   --   HGB 12.3 10.3* 9.8*  HCT 40.8 34.1* 32.6*  MCV 84.5 84.6 84.9  PLT PLATELET CLUMPS NOTED ON SMEAR, UNABLE TO ESTIMATE PLATELET CLUMPS NOTED ON SMEAR, UNABLE TO ESTIMATE PLATELET CLUMPS NOTED ON SMEAR, UNABLE TO ESTIMATE   Cardiac Enzymes: No results for input(s): CKTOTAL, CKMB, CKMBINDEX, TROPONINI in the last 168 hours. BNP: BNP (last 3 results) Recent Labs    11/21/18 1742  BNP 56.3    ProBNP (last 3 results) No results for input(s): PROBNP in the last 8760 hours.  CBG: No results for input(s): GLUCAP in the last 168 hours.     SignedRadene Gunning NP [facility] 12/14/2018, 12:28 PM

## 2018-12-14 NOTE — Progress Notes (Addendum)
Patient requested I give her the discharge instructions paperwork to read for herself. I sat bedside patient to reassure the patient I would answer any questions she had after reading her instructions. Patient stated she no longer wanted to be a DNR and that she did not want to take her senekot. I told the patient I would reach out to physicians regarding her code status and medications. Patient stated she did not have any further questions and were waiting for her clothes and her ride to come pick her up. Will continue to monitor this patient.   Physician Eulogio Bear, DO notified of patient's request to be Full Code Status.

## 2018-12-15 ENCOUNTER — Telehealth: Payer: Self-pay | Admitting: Family

## 2018-12-15 ENCOUNTER — Telehealth: Payer: Self-pay | Admitting: *Deleted

## 2018-12-15 ENCOUNTER — Other Ambulatory Visit: Payer: 59

## 2018-12-15 LAB — CALCIUM, IONIZED: Calcium, Ionized, Serum: 4.7 mg/dL (ref 4.5–5.6)

## 2018-12-15 LAB — URINE CULTURE: Culture: >=100000 — AB

## 2018-12-15 NOTE — Telephone Encounter (Signed)
Could you please contact pt to arrange a 1 week hospital follow up with me in person?

## 2018-12-15 NOTE — Telephone Encounter (Signed)
Called pt for TCM/Hospital follow up call. Niece answered phone stating pt was busy and couldn't come to the phone to answer questions. Pt did get on speaker phone long enough to make f/u appt. In-person hospital follow up scheduled Mon. 12/20/18 with PCP.

## 2018-12-16 ENCOUNTER — Other Ambulatory Visit: Payer: Self-pay | Admitting: Family

## 2018-12-16 ENCOUNTER — Other Ambulatory Visit: Payer: Self-pay

## 2018-12-16 ENCOUNTER — Encounter: Payer: Self-pay | Admitting: Cardiology

## 2018-12-16 ENCOUNTER — Telehealth: Payer: Self-pay | Admitting: Cardiology

## 2018-12-16 ENCOUNTER — Ambulatory Visit: Payer: 59 | Admitting: Cardiology

## 2018-12-16 VITALS — BP 120/72 | HR 82 | Ht 64.0 in | Wt 254.0 lb

## 2018-12-16 DIAGNOSIS — Z7901 Long term (current) use of anticoagulants: Secondary | ICD-10-CM | POA: Diagnosis not present

## 2018-12-16 DIAGNOSIS — I480 Paroxysmal atrial fibrillation: Secondary | ICD-10-CM | POA: Diagnosis not present

## 2018-12-16 DIAGNOSIS — R9439 Abnormal result of other cardiovascular function study: Secondary | ICD-10-CM

## 2018-12-16 DIAGNOSIS — I10 Essential (primary) hypertension: Secondary | ICD-10-CM

## 2018-12-16 DIAGNOSIS — N12 Tubulo-interstitial nephritis, not specified as acute or chronic: Secondary | ICD-10-CM

## 2018-12-16 DIAGNOSIS — R5381 Other malaise: Secondary | ICD-10-CM

## 2018-12-16 HISTORY — DX: Long term (current) use of anticoagulants: Z79.01

## 2018-12-16 NOTE — Progress Notes (Signed)
Cardiology Office Note:    Date:  12/16/2018   ID:  [name], DOB [birth year], MRN [id number]  PCP:  Jamie Alar, NP  Cardiologist:  Kirk Ruths, MD  Electrophysiologist:  None   Referring MD: Jamie Alar, NP   No chief complaint on file. 6 month- discharged 12/14/2018  History of Present Illness:    [name] is a 63 y.o. female with a hx of an abnormal nuclear stress test in July 2012.  Medical therapy was recommended.  She also had PAF documented by monitor in the past.  She is been on diltiazem and Eliquis for this.  She is actually here today for a 56-month follow-up visit.  Since she saw Dr. Stanford Rogers last she has been in the hospital several times.  She has had pyelonephritis presumably secondary to nephrolithiasis.  She was hospitalized May 27 to May 31, June 28 to July 2, and July 20 to July 21.  Cardiology was not consulted during any of those admissions and apparently she did well from that standpoint.  Interestingly her weight is down almost 50 pounds from February.  An echocardiogram was done 11/22/2018.  This revealed an ejection fraction of 50 to 55% with mild left ventricular wall thickness.  I reviewed her EKGs done at the hospital and they show that she maintain sinus rhythm.  She was just discharged 2 days ago but wanted to keep this visit as it was previously scheduled.  She admits from a cardiac standpoint she seems to be doing well, she denies any unusual chest pain.  She is not had tachycardia.  She has chronic dyspnea which is multifactorial.  She is fairly debilitated and in a wheelchair.  She is getting physical therapy at home.  She has problems ambulating secondary to her morbid obesity, fibromyalgia, and severe deformity of her toes.  She is also had prior amputation of one of her toes.  Past Medical History:  Diagnosis Date  . Abnormal Pap smear    years ago/no biopsy  . Allergic rhinitis   . Anemia, iron deficiency    iron infusion  scheduled for 04/02/2018  . Aortic atherosclerosis ([place])   . Arthritis    back- lower  . Asthma   . Atrial fibrillation ([place]) August 2012   OFF XARELTO LAST MONTH DUE TO BLEEDING IN STOOL  . Bursitis of hip left  . Chronic diastolic (congestive) heart failure (HCC)    pt unaware of this  . COPD (chronic obstructive pulmonary disease) with chronic bronchitis ([place])   . Decreased pulses in feet   . Dysrhythmia    afib, followed by Dr. Stanford Rogers   . Edema of both legs   . Esophagitis 02/02/2014   Distal, linear erosions, noted on endoscopy  . Fatty liver 01/07/2012  . Fibroid 1974   fibroid cyst on left fallopian tube  . Fibromyalgia   . Foot ulcer ([place])    AREA HEALED RIGHT FOOT  . GERD (gastroesophageal reflux disease)   . Headache(784.0)    occasional sinus headache   . Hepatomegaly   . History of blood transfusion JULY 2015  . History of cardiomegaly 12/06/2010   Noted on CT  . History of E. coli septicemia   . Hypertension   . Hypoparathyroidism ([place]) 01/07/2011  . Hypothyroidism   . Kidney stone 09/21/2015   passed on their own  . Morbid obesity ([place])   . MRSA infection   . Nephrolithiasis 2/16, 9/16   SEES DR [name]  .  Pernicious anemia 02/20/2014   followed by [name]  . Pneumonia   . PONV (postoperative nausea and vomiting)   . Pyelonephritis 11/24/2018  . Rash    thighs and back  . Seizures ([place])    infancy secondary to fever  . Staph aureus infection   . Thoracic spondylosis 12/06/2010   Noted on CT  . Thyroid cancer ([place]) 2011   THYROIDECTOMY DONE  . Uterine cancer ([place]) 2014   Mirena IUD  . Vitamin B 12 deficiency   . Vitamin D deficiency     Past Surgical History:  Procedure Laterality Date  . AMPUTATION Right 05/18/2014   Procedure: RIGHT FIFTH RAY AMPUTATION FOOT;  Surgeon: Jamie Simmer, MD;  Location: [facility];  Service: Orthopedics;  Laterality: Right;  . APPENDECTOMY    . BUNIONECTOMY     bilateral  . COLONOSCOPY WITH PROPOFOL N/A  02/02/2014   Procedure: COLONOSCOPY WITH PROPOFOL;  Surgeon: Jamie Shipper, MD;  [facility];  Service: Endoscopy;  Laterality: N/A;  . cyst on ovary removed     . CYSTOSCOPY W/ RETROGRADES  03/03/2012   Procedure: CYSTOSCOPY WITH RETROGRADE PYELOGRAM;  Surgeon: Jamie Amass, MD;  [facility]: [facility];  Service: Urology;  Laterality: Bilateral;  . CYSTOSCOPY W/ URETERAL STENT PLACEMENT Right 11/25/2017   Procedure: CYSTOSCOPY WITH RETROGRADE RIGHT URETERAL STENT PLACEMENT;  Surgeon: Jamie Gallo, MD;  Location: [facility];  Service: Urology;  Laterality: Right;  . CYSTOSCOPY W/ URETERAL STENT PLACEMENT Right 01/15/2018   Procedure: RIGHT STENT EXCHANGE;  Surgeon: Jamie Hughs, MD;  [facility];  Service: Urology;  Laterality: Right;  . CYSTOSCOPY WITH RETROGRADE PYELOGRAM, URETEROSCOPY AND STENT PLACEMENT Left 08/25/2012   Procedure: CYSTOSCOPY WITH RETROGRADE PYELOGRAM, URETEROSCOPY;  Surgeon: Jamie Amass, MD;  [facility];  Service: Urology;  Laterality: Left;  . CYSTOSCOPY WITH STENT PLACEMENT Left 01/15/2018   Procedure: LEFT STENT PLACEMENT;  Surgeon: Jamie Hughs, MD;  [facility];  Service: Urology;  Laterality: Left;  . CYSTOSCOPY WITH STENT PLACEMENT Right 01/22/2018   Procedure: RIGHT STENT REMOVAL;  Surgeon: Jamie Hughs, MD;  [facility];  Service: Urology;  Laterality: Right;  . CYSTOSCOPY/URETEROSCOPY/HOLMIUM LASER/STENT PLACEMENT Right 12/23/2017   Procedure: RIGHT URETEROSCOPY STONE REMOVAL, HOLMIUM LASER, RIGHT /STENT EXCHANGE;  Surgeon: Jamie Hughs, MD;  [facility];  Service: Urology;  Laterality: Right;  . CYSTOSCOPY/URETEROSCOPY/HOLMIUM LASER/STENT PLACEMENT Left 01/22/2018   Procedure: LEFT URETEROSCOPY STONE REMOVAL HOLMIUM LASER LEFT STENT [name];  Surgeon: Jamie Hughs, MD;  [facility];  Service: Urology;  Laterality: Left;  . DILATION AND CURETTAGE OF UTERUS N/A 02/21/2013    Procedure: DILATATION AND CURETTAGE;  Surgeon: Jamie Speller, MD;  [facility];  Service: Gynecology;  Laterality: N/A;  . DILATION AND CURETTAGE OF UTERUS N/A 04/09/2015   Procedure: [place] IUD removal;  Surgeon: Jamie Salon, MD;  Location: [facility];  Service: Gynecology;  Laterality: N/A;  Patient weight 307lbs  . ESOPHAGOGASTRODUODENOSCOPY (EGD) WITH PROPOFOL N/A 02/02/2014   Procedure: ESOPHAGOGASTRODUODENOSCOPY (EGD) WITH PROPOFOL;  Surgeon: Jamie Shipper, MD;  [facility];  Service: Endoscopy;  Laterality: N/A;  . GASTRIC BYPASS  1974  . HOLMIUM LASER APPLICATION Left 0/0/3491   Procedure: HOLMIUM LASER APPLICATION;  Surgeon: Jamie Amass, MD;  [facility];  Service: Urology;  Laterality: Left;  . LITHOTRIPSY  03/2012  . LITHOTRIPSY  2/16  . THYROIDECTOMY  05/15/2010  . TONSILLECTOMY AND  ADENOIDECTOMY    . URETEROSCOPY  03/03/2012   Procedure: URETEROSCOPY;  Surgeon: Jamie Amass, MD;  [facility];  Service: Urology;  Laterality: Left;  . URETEROSCOPY WITH HOLMIUM LASER LITHOTRIPSY Bilateral 01/15/2018   Procedure: CYSTOSCOPY/BILATERAL URETEROSCOPY WITH HOLMIUM LASER LITHOTRIPSY STONE REMOVAL;  Surgeon: Jamie Hughs, MD;  [facility];  Service: Urology;  Laterality: Bilateral;    Current Medications: Current Meds  Medication Sig  . acetaminophen (TYLENOL) 325 MG tablet Take 2 tablets (650 mg total) by mouth every 6 (six) hours as needed for mild pain or moderate pain.  [name] albuterol (PROVENTIL HFA;VENTOLIN HFA) 108 (90 Base) MCG/ACT inhaler Inhale 2 puffs into the lungs every 6 (six) hours as needed for wheezing or shortness of breath.  [name] albuterol (PROVENTIL) (2.5 MG/3ML) 0.083% nebulizer solution Take 3 mLs (2.5 mg total) by nebulization every 6 (six) hours as needed for wheezing or shortness of breath.  . calcitRIOL (ROCALTROL) 0.5 MCG capsule Take 2 mcg by mouth daily.   . calcium elemental as carbonate (TUMS ULTRA  1000) 400 MG chewable tablet Chew 2,000 mg by mouth 2 (two) times daily.  . cyanocobalamin (,VITAMIN B-12,) 1000 MCG/ML injection Inject 1 mL (1,000 mcg total) into the skin every 30 (thirty) days.  [name] desoximetasone (TOPICORT) 0.05 % cream Apply topically 2 (two) times daily as needed.  . diclofenac sodium (VOLTAREN) 1 % GEL Apply 2 g topically 4 (four) times daily.  [name] diltiazem (CARDIZEM CD) 120 MG 24 hr capsule Take 1 capsule (120 mg total) by mouth daily.  . diphenhydrAMINE (BENADRYL) 25 mg capsule Take 25 mg by mouth every 6 (six) hours as needed for itching.   [name] ELIQUIS 5 MG TABS tablet TAKE 1 TABLET BY MOUTH TWO  TIMES DAILY (Patient taking differently: Take 5 mg by mouth 2 (two) times daily. )  . EPINEPHrine (EPIPEN 2-PAK) 0.3 mg/0.3 mL IJ SOAJ injection Inject 0.3 mg into the muscle once as needed for anaphylaxis (severe allergic reaction).   . furosemide (LASIX) 40 MG tablet TAKE 2 TABLETS BY MOUTH  DAILY IN THE MORNING AND 1  TABLET IN THE EVENING (Patient taking differently: Take 40-80 mg by mouth See admin instructions. Take 2 tablets (80 mg) by mouth every morning and 1 tablet (40 mg) every evening)  . hydrochlorothiazide (HYDRODIURIL) 25 MG tablet TAKE 1 TABLET(25 MG) BY MOUTH DAILY IN THE MORNING (Patient taking differently: Take 25 mg by mouth daily with breakfast. )  . hydrocortisone cream 1 % Apply topically 4 (four) times daily as needed for itching.  . levocetirizine (XYZAL) 5 MG tablet TAKE 1 TABLET BY MOUTH  EVERY EVENING  . levothyroxine (SYNTHROID, LEVOTHROID) 100 MCG tablet Take 400 mcg by mouth daily before breakfast.   . Melatonin 5 MG TABS Take 5 mg by mouth at bedtime as needed (for sleep).   . montelukast (SINGULAIR) 10 MG tablet Take 1 tablet (10 mg total) by mouth at bedtime.  . mupirocin ointment (BACTROBAN) 2 % Place 1 application into the nose 2 (two) times daily.  [name] nystatin (MYCOSTATIN) 100000 UNIT/ML suspension TAKE 5 ML BY MOUTH FOUR TIMES DAILY (Patient taking  differently: Use as directed 5 mLs in the mouth or throat 4 (four) times daily. )  . nystatin cream (MYCOSTATIN) Apply 1 application topically 2 (two) times daily.  [name] omeprazole (PRILOSEC) 40 MG capsule TAKE 1 CAPSULE BY MOUTH  DAILY (Patient taking differently: Take 40 mg by mouth daily. )  . ondansetron (ZOFRAN) 4 MG  tablet Take 1 tablet (4 mg total) by mouth every 8 (eight) hours as needed for nausea or vomiting.  . potassium chloride 20 MEQ/15ML (10%) SOLN Take 15 mLs (20 mEq total) by mouth 2 (two) times daily.  [name] senna (SENOKOT) 8.6 MG TABS tablet Take 1 tablet (8.6 mg total) by mouth 2 (two) times daily.  [name] sulfamethoxazole-trimethoprim (BACTRIM DS) 800-160 MG tablet Take 1 tablet by mouth every 12 (twelve) hours for 6 days.  . SYMBICORT 160-4.5 MCG/ACT inhaler INHALE 2 PUFFS BY MOUTH TWO TIMES DAILY  . Syringe/Needle, Disp, (SYRINGE 3CC/20GX1") 20G X 1" 3 ML MISC Use monthly as directed  . traMADol (ULTRAM) 50 MG tablet TAKE 1 TABLET(50 MG) BY MOUTH EVERY 12 HOURS AS NEEDED (Patient taking differently: Take 50 mg by mouth every 12 (twelve) hours as needed for moderate pain. )  . Vitamin D, Ergocalciferol, (DRISDOL) 1.25 MG (50000 UT) CAPS capsule Take 1 capsule (50,000 Units total) by mouth every Monday, Wednesday, and Friday.  . zafirlukast (ACCOLATE) 20 MG tablet TAKE 1 TABLET BY MOUTH TWO  TIMES DAILY BEFORE MEALS (Patient taking differently: Take 20 mg by mouth 2 (two) times daily before a meal. )     Allergies:   Other, Amoxicillin-pot clavulanate, Ciprofloxacin, Food, Keflex [cephalexin], Penicillins, Rocephin [ceftriaxone], and Tomato   Social History   Socioeconomic History  . Marital status: Single    Spouse name: Not on file  . Number of children: 0  . Years of education: Not on file  . Highest education level: Not on file  Occupational History  . Occupation: works in Insurance claims handler  . Occupation: DESIGN COMPUTER CHIP    Employer: [employer]  Social Needs  .  Financial resource strain: Not on file  . Food insecurity    Worry: Not on file    Inability: Not on file  . Transportation needs    Medical: Not on file    Non-medical: Not on file  Tobacco Use  . Smoking status: Former Smoker    Packs/day: 0.50    Years: 25.00    Pack years: 12.50    Types: Cigarettes    Start date: 12/24/1970    Quit date: 05/27/1995    Years since quitting: 23.5  . Smokeless tobacco: Never Used  . Tobacco comment: quit smoking 19 years ago  Substance and Sexual Activity  . Alcohol use: Yes    Alcohol/week: 0.0 standard drinks    Comment: 1/2 glass per month  . Drug use: No  . Sexual activity: Yes    Partners: Male    Birth control/protection: Post-menopausal  Lifestyle  . Physical activity    Days per week: Not on file    Minutes per session: Not on file  . Stress: Not on file  Relationships  . Social Herbalist on phone: Not on file    Gets together: Not on file    Attends religious service: Not on file    Active member of club or organization: Not on file    Attends meetings of clubs or organizations: Not on file    Relationship status: Not on file  Other Topics Concern  . Not on file  Social History Narrative   Occupation: works in Insurance claims handler - Field seismologist   Single       Former Smoker - quit tobacco 12 years ago.  She was light smoker for 10 years.  Family History: The patient's family history includes Asthma in her brother; Diabetes in her father; Heart attack in her father and mother; Heart disease in her brother and mother; Hypertension in her brother, father, and mother; Hyperthyroidism in her mother; Lung cancer in her father. There is no history of Colon cancer, Esophageal cancer, Stomach cancer, Kidney disease, or Liver disease.  ROS:   Please see the history of present illness.     All other systems reviewed and are negative.  EKGs/Labs/Other Studies Reviewed:    The following studies  were reviewed today: Echo 11/22/2018  EKG:  EKG is ordered today.  The ekg ordered today demonstrates NSR, LAD, Qs V2  Recent Labs: 11/21/2018: B Natriuretic Peptide 56.3 11/22/2018: TSH 0.582 11/25/2018: Magnesium 2.1 12/12/2018: ALT 33 12/14/2018: BUN 16; Creatinine, Ser 0.91; Hemoglobin 9.8; Platelets PLATELET CLUMPS NOTED ON SMEAR, UNABLE TO ESTIMATE; Potassium 3.6; Sodium 133  Recent Lipid Panel    Component Value Date/Time   CHOL 146 03/19/2016 1030   TRIG 98.0 03/19/2016 1030   HDL 49.30 03/19/2016 1030   CHOLHDL 3 03/19/2016 1030   VLDL 19.6 03/19/2016 1030   LDLCALC 77 03/19/2016 1030    Physical Exam:    VS:  BP 120/72   Pulse 82   Ht 5\' 4"  (1.626 m)   Wt 254 lb (115.2 kg)   LMP 06/11/2012   SpO2 98%   BMI 43.60 kg/m     Wt Readings from Last 3 Encounters:  12/16/18 254 lb (115.2 kg)  12/13/18 237 lb (107.5 kg)  11/25/18 292 lb 1.8 oz (132.5 kg)     GEN: Morbidly obese Caucasian female who presents to the office in a wheel chair. No acute distress HEENT: Normal NECK: No JVD; No carotid bruits LYMPHATICS: No lymphadenopathy CARDIAC: RRR, no murmurs, rubs, gallops RESPIRATORY:  Clear to auscultation without rales, wheezing or rhonchi  ABDOMEN: Soft, non-tender, non-distended MUSCULOSKELETAL:  No edema; No deformity  SKIN: Warm and dry NEUROLOGIC:  Alert and oriented x 3 PSYCHIATRIC:  Normal affect   ASSESSMENT:    Abnormal nuclear stress test Abnormal Myoview July 2012- medical rx.  \Echo Nov 2018- normal LVF  PAF (paroxysmal atrial fibrillation) ([place]) Documented 2012- NSR since.  On chronic anticoagulation.  Chronic anticoagulation CHADS VASC=2 for sex and H/O HTN- she is on Eliquis  Morbid obesity ([place]) BMI was 50 in Feb 2020.  She has had negative sleep studies (per patient). BMI now down to 43 after multiple admissions for pyelonephritis May-July 2020  Pyelonephritis Admitted May 2020 and twice in July 2020. Presumed secondary to chronic  nephrolithiasis   Debilitated patient Pt is debilitated secondary to multiple issues, morbid obesity, fibromyalgia, severe deformities of her toes, and h/o toe amputation.   PLAN:    Same cardiac Rx- f/u dr [name] in 6 months.   Medication Adjustments/Labs and Tests Ordered: Current medicines are reviewed at length with the patient today.  Concerns regarding medicines are outlined above.  No orders of the defined types were placed in this encounter.  No orders of the defined types were placed in this encounter.   Patient Instructions  Medication Instructions:  Your physician recommends that you continue on your current medications as directed. Please refer to the Current Medication list given to you today. If you need a refill on your cardiac medications before your next appointment, please call your pharmacy.   Lab work: None  If you have labs (blood work) drawn today and your tests are completely normal, you  will receive your results only by: [name] MyChart Message (if you have MyChart) OR . A paper copy in the mail If you have any lab test that is abnormal or we need to change your treatment, we will call you to review the results.  Testing/Procedures: none  Follow-Up: At [facility], you and your health needs are our priority.  As part of our continuing mission to provide you with exceptional heart care, we have created designated Provider Care Teams.  These Care Teams include your primary Cardiologist (physician) and Advanced Practice Providers (APPs -  Physician Assistants and Nurse Practitioners) who all work together to provide you with the care you need, when you need it. You will need a follow up appointment in 6 months.  Please call our office 2 months in advance to schedule this appointment.  You may see Kirk Ruths, MD or one of the following Advanced Practice Providers on your designated Care Team:   Kerin Ransom, PA-C Roby Lofts, [place] . Sande Rives,  PA-C  Any Other Special Instructions Will Be Listed Below (If Applicable).      Angelena Form, PA-C  12/16/2018 3:44 PM    [facility]

## 2018-12-16 NOTE — Assessment & Plan Note (Signed)
Admitted May 2020 and twice in July 2020. Presumed secondary to chronic nephrolithiasis

## 2018-12-16 NOTE — Assessment & Plan Note (Signed)
BMI was 50 in Feb 2020.  She has had negative sleep studies (per patient). BMI now down to 43 after multiple admissions for pyelonephritis May-July 2020

## 2018-12-16 NOTE — Assessment & Plan Note (Signed)
Pt is debilitated secondary to multiple issues, morbid obesity, fibromyalgia, severe deformities of her toes, and h/o toe amputation.

## 2018-12-16 NOTE — Assessment & Plan Note (Signed)
CHADS VASC=2 for sex and H/O HTN- she is on Eliquis

## 2018-12-16 NOTE — Telephone Encounter (Signed)

## 2018-12-16 NOTE — Assessment & Plan Note (Signed)
Documented 2012- NSR since.  On chronic anticoagulation.

## 2018-12-16 NOTE — Patient Instructions (Signed)
Medication Instructions:  Your physician recommends that you continue on your current medications as directed. Please refer to the Current Medication list given to you today. If you need a refill on your cardiac medications before your next appointment, please call your pharmacy.   Lab work: None  If you have labs (blood work) drawn today and your tests are completely normal, you will receive your results only by: [name] MyChart Message (if you have MyChart) OR . A paper copy in the mail If you have any lab test that is abnormal or we need to change your treatment, we will call you to review the results.  Testing/Procedures: none  Follow-Up: At [facility], you and your health needs are our priority.  As part of our continuing mission to provide you with exceptional heart care, we have created designated Provider Care Teams.  These Care Teams include your primary Cardiologist (physician) and Advanced Practice Providers (APPs -  Physician Assistants and Nurse Practitioners) who all work together to provide you with the care you need, when you need it. You will need a follow up appointment in 6 months.  Please call our office 2 months in advance to schedule this appointment.  You may see Kirk Ruths, MD or one of the following Advanced Practice Providers on your designated Care Team:   Kerin Ransom, PA-C Roby Lofts, [place] . Sande Rives, PA-C  Any Other Special Instructions Will Be Listed Below (If Applicable).

## 2018-12-16 NOTE — Assessment & Plan Note (Signed)
Abnormal Myoview July 2012- medical rx.  \Echo Nov 2018- normal LVF

## 2018-12-18 LAB — CULTURE, BLOOD (ROUTINE X 2)
Culture: NO GROWTH
Culture: NO GROWTH
Special Requests: ADEQUATE
Special Requests: ADEQUATE

## 2018-12-20 ENCOUNTER — Inpatient Hospital Stay: Payer: Self-pay | Admitting: Family

## 2018-12-20 ENCOUNTER — Inpatient Hospital Stay: Payer: Self-pay | Admitting: *Deleted

## 2018-12-22 ENCOUNTER — Ambulatory Visit: Payer: 59 | Admitting: Family

## 2018-12-22 ENCOUNTER — Encounter: Payer: Self-pay | Admitting: Family

## 2018-12-22 ENCOUNTER — Other Ambulatory Visit: Payer: Self-pay

## 2018-12-22 ENCOUNTER — Ambulatory Visit: Payer: 59 | Admitting: Cardiology

## 2018-12-22 VITALS — BP 114/55 | HR 84 | Temp 97.8°F | Resp 16 | Wt 262.4 lb

## 2018-12-22 DIAGNOSIS — L98491 Non-pressure chronic ulcer of skin of other sites limited to breakdown of skin: Secondary | ICD-10-CM

## 2018-12-22 DIAGNOSIS — N390 Urinary tract infection, site not specified: Secondary | ICD-10-CM | POA: Diagnosis not present

## 2018-12-22 DIAGNOSIS — E876 Hypokalemia: Secondary | ICD-10-CM

## 2018-12-22 DIAGNOSIS — R5381 Other malaise: Secondary | ICD-10-CM | POA: Diagnosis not present

## 2018-12-22 LAB — CBC WITH DIFFERENTIAL/PLATELET
Basophils Absolute: 0.1 10*3/uL (ref 0.0–0.1)
Basophils Relative: 0.4 % (ref 0.0–3.0)
Eosinophils Absolute: 0.3 10*3/uL (ref 0.0–0.7)
Eosinophils Relative: 2.3 % (ref 0.0–5.0)
HCT: 39.4 % (ref 36.0–46.0)
Hemoglobin: 12.5 g/dL (ref 12.0–15.0)
Lymphocytes Relative: 10.4 % — ABNORMAL LOW (ref 12.0–46.0)
Lymphs Abs: 1.3 10*3/uL (ref 0.7–4.0)
MCHC: 31.6 g/dL (ref 30.0–36.0)
MCV: 79.5 fl (ref 78.0–100.0)
Monocytes Absolute: 0.5 10*3/uL (ref 0.1–1.0)
Monocytes Relative: 4.2 % (ref 3.0–12.0)
Neutro Abs: 10.5 10*3/uL — ABNORMAL HIGH (ref 1.4–7.7)
Neutrophils Relative %: 82.7 % — ABNORMAL HIGH (ref 43.0–77.0)
Platelets: 275 10*3/uL (ref 150.0–400.0)
RBC: 4.96 Mil/uL (ref 3.87–5.11)
RDW: 18.7 % — ABNORMAL HIGH (ref 11.5–15.5)
WBC: 12.6 10*3/uL — ABNORMAL HIGH (ref 4.0–10.5)

## 2018-12-22 LAB — COMPREHENSIVE METABOLIC PANEL
ALT: 35 U/L (ref 0–35)
AST: 23 U/L (ref 0–37)
Albumin: 3.9 g/dL (ref 3.5–5.2)
Alkaline Phosphatase: 119 U/L — ABNORMAL HIGH (ref 39–117)
BUN: 28 mg/dL — ABNORMAL HIGH (ref 6–23)
CO2: 30 mEq/L (ref 19–32)
Calcium: 10.1 mg/dL (ref 8.4–10.5)
Chloride: 91 mEq/L — ABNORMAL LOW (ref 96–112)
Creatinine, Ser: 0.89 mg/dL (ref 0.40–1.20)
GFR: 64.35 mL/min (ref >60.00)
Glucose, Bld: 105 mg/dL — ABNORMAL HIGH (ref 70–99)
Potassium: 3.1 mEq/L — ABNORMAL LOW (ref 3.5–5.1)
Sodium: 133 mEq/L — ABNORMAL LOW (ref 135–145)
Total Bilirubin: 0.5 mg/dL (ref 0.2–1.2)
Total Protein: 7.2 g/dL (ref 6.0–8.3)

## 2018-12-22 MED ORDER — POTASSIUM CHLORIDE 20 MEQ/15ML (10%) PO SOLN: 20.0000 meq | Freq: Two times a day (BID) | ORAL | 5 refills | Status: DC

## 2018-12-22 MED ORDER — NYSTATIN 100000 UNIT/GM EX POWD: Freq: Two times a day (BID) | CUTANEOUS | 1 refills | Status: DC

## 2018-12-22 NOTE — Progress Notes (Signed)
Subjective:    Patient ID: [name], female    DOB: Mar 15, 1956, 63 y.o.   MRN: [id number]  HPI  Patient is a 63 yr old female who preesent today for hospital follow up. She was readmitted on 7/19 with generalized weakness, found to have e, coli uti/pyelonephritis.  She was sent home on 12/14/18 with a 7 day course of abx with plans to follow up with urology. She saw urology today and reports that a urine culture was performed today. Reports that her urologist plans to put her on a daily prophylactic antibiotic. Reports that she still feels tired, weak. She denies fever, dysuria, urinary frequency or back pain.  Reports that she has not started home health PT "because they wanted to come too early."  Review of Systems See HPI    Past Medical History:  Diagnosis Date   Abnormal Pap smear    years ago/no biopsy   Allergic rhinitis    Anemia, iron deficiency    iron infusion scheduled for 04/02/2018   Aortic atherosclerosis ([place])    Arthritis    back- lower   Asthma    Atrial fibrillation ([place]) August 2012   OFF XARELTO LAST MONTH DUE TO BLEEDING IN STOOL   Bursitis of hip left   Chronic diastolic (congestive) heart failure ([place])    pt unaware of this   COPD (chronic obstructive pulmonary disease) with chronic bronchitis (HCC)    Decreased pulses in feet    Dysrhythmia    afib, followed by Dr. Stanford Breed    Edema of both legs    Esophagitis 02/02/2014   Distal, linear erosions, noted on endoscopy   Fatty liver 01/07/2012   Fibroid 1974   fibroid cyst on left fallopian tube   Fibromyalgia    Foot ulcer ([place])    AREA HEALED RIGHT FOOT   GERD (gastroesophageal reflux disease)    Headache(784.0)    occasional sinus headache    Hepatomegaly    History of blood transfusion JULY 2015   History of cardiomegaly 12/06/2010   Noted on CT   History of E. coli septicemia    Hypertension    Hypoparathyroidism ([place]) 01/07/2011   Hypothyroidism    Kidney  stone 2015/10/04   passed on their own   Morbid obesity ([place])    MRSA infection    Nephrolithiasis 2/16, 9/16   SEES DR GRAPEY   Pernicious anemia 02/20/2014   followed by Debbrah Alar   Pneumonia    PONV (postoperative nausea and vomiting)    Pyelonephritis 11/24/2018   Rash    thighs and back   Seizures ([place])    infancy secondary to fever   Staph aureus infection    Thoracic spondylosis 12/06/2010   Noted on CT   Thyroid cancer ([place]) 2011   THYROIDECTOMY DONE   Uterine cancer ([place]) 2014   Mirena IUD   Vitamin B 12 deficiency    Vitamin D deficiency      Social History   Socioeconomic History   Marital status: Single    Spouse name: Not on file   Number of children: 0   Years of education: Not on file   Highest education level: Not on file  Occupational History   Occupation: works in Insurance claims handler   Occupation: DESIGN COMPUTER CHIP    Employer: [employer] resource strain: Not on file   Food insecurity    Worry: Not on file    Inability:  Not on file   Transportation needs    Medical: Not on file    Non-medical: Not on file  Tobacco Use   Smoking status: Former Smoker    Packs/day: 0.50    Years: 25.00    Pack years: 12.50    Types: Cigarettes    Start date: 12/24/1970    Quit date: 05/27/1995    Years since quitting: 23.5   Smokeless tobacco: Never Used   Tobacco comment: quit smoking 19 years ago  Substance and Sexual Activity   Alcohol use: Yes    Alcohol/week: 0.0 standard drinks    Comment: 1/2 glass per month   Drug use: No   Sexual activity: Yes    Partners: Male    Birth control/protection: Post-menopausal  Lifestyle   Physical activity    Days per week: Not on file    Minutes per session: Not on file   Stress: Not on file  Relationships   Social connections    Talks on phone: Not on file    Gets together: Not on file    Attends religious service: Not on file    Active  member of club or organization: Not on file    Attends meetings of clubs or organizations: Not on file    Relationship status: Not on file   Intimate partner violence    Fear of current or ex partner: Not on file    Emotionally abused: Not on file    Physically abused: Not on file    Forced sexual activity: Not on file  Other Topics Concern   Not on file  Social History Narrative   Occupation: works in Insurance claims handler - Field seismologist   Single       Former Smoker - quit tobacco 12 years ago.  She was light smoker for 10 years.                 Past Surgical History:  Procedure Laterality Date   AMPUTATION Right 05/18/2014   Procedure: RIGHT FIFTH RAY AMPUTATION FOOT;  Surgeon: Wylene Simmer, MD;  Location: [facility];  Service: Orthopedics;  Laterality: Right;   APPENDECTOMY     BUNIONECTOMY     bilateral   COLONOSCOPY WITH PROPOFOL N/A 02/02/2014   Procedure: COLONOSCOPY WITH PROPOFOL;  Surgeon: Irene Shipper, MD;  [facility];  Service: Endoscopy;  Laterality: N/A;   cyst on ovary removed      CYSTOSCOPY W/ RETROGRADES  03/03/2012   Procedure: CYSTOSCOPY WITH RETROGRADE PYELOGRAM;  Surgeon: Bernestine Amass, MD;  [facility];  Service: Urology;  Laterality: Bilateral;   CYSTOSCOPY W/ URETERAL STENT PLACEMENT Right 11/25/2017   Procedure: CYSTOSCOPY WITH RETROGRADE RIGHT URETERAL STENT PLACEMENT;  Surgeon: Franchot Gallo, MD;  Location: [facility];  Service: Urology;  Laterality: Right;   CYSTOSCOPY W/ URETERAL STENT PLACEMENT Right 01/15/2018   Procedure: RIGHT STENT EXCHANGE;  Surgeon: Ardis Hughs, MD;  [facility];  Service: Urology;  Laterality: Right;   CYSTOSCOPY WITH RETROGRADE PYELOGRAM, URETEROSCOPY AND STENT PLACEMENT Left 08/25/2012   Procedure: CYSTOSCOPY WITH RETROGRADE PYELOGRAM, URETEROSCOPY;  Surgeon: Bernestine Amass, MD;  [facility];  Service: Urology;  Laterality: Left;   CYSTOSCOPY WITH STENT PLACEMENT Left 01/15/2018    Procedure: LEFT STENT PLACEMENT;  Surgeon: Ardis Hughs, MD;  [facility];  Service: Urology;  Laterality: Left;   CYSTOSCOPY WITH STENT PLACEMENT Right 01/22/2018   Procedure: RIGHT STENT REMOVAL;  Surgeon: Ardis Hughs,  MD;  [facility];  Service: Urology;  Laterality: Right;   CYSTOSCOPY/URETEROSCOPY/HOLMIUM LASER/STENT PLACEMENT Right 12/23/2017   Procedure: RIGHT URETEROSCOPY STONE REMOVAL, HOLMIUM LASER, RIGHT /STENT EXCHANGE;  Surgeon: Ardis Hughs, MD;  [facility];  Service: Urology;  Laterality: Right;   CYSTOSCOPY/URETEROSCOPY/HOLMIUM LASER/STENT PLACEMENT Left 01/22/2018   Procedure: LEFT URETEROSCOPY STONE REMOVAL HOLMIUM LASER LEFT STENT Freddi Starr;  Surgeon: Ardis Hughs, MD;  [facility];  Service: Urology;  Laterality: Left;   DILATION AND CURETTAGE OF UTERUS N/A 02/21/2013   Procedure: DILATATION AND CURETTAGE;  Surgeon: Lyman Speller, MD;  Location: [facility];  Service: Gynecology;  Laterality: N/A;   DILATION AND CURETTAGE OF UTERUS N/A 04/09/2015   Procedure: [place] IUD removal;  Surgeon: Megan Salon, MD;  Location: [facility];  Service: Gynecology;  Laterality: N/A;  Patient weight 307lbs   ESOPHAGOGASTRODUODENOSCOPY (EGD) WITH PROPOFOL N/A 02/02/2014   Procedure: ESOPHAGOGASTRODUODENOSCOPY (EGD) WITH PROPOFOL;  Surgeon: Irene Shipper, MD;  [facility];  Service: Endoscopy;  Laterality: N/A;   GASTRIC BYPASS  1974   HOLMIUM LASER APPLICATION Left 4/0/9735   Procedure: HOLMIUM LASER APPLICATION;  Surgeon: Bernestine Amass, MD;  [facility];  Service: Urology;  Laterality: Left;   LITHOTRIPSY  03/2012   LITHOTRIPSY  2/16   THYROIDECTOMY  05/15/2010   TONSILLECTOMY AND ADENOIDECTOMY     URETEROSCOPY  03/03/2012   Procedure: URETEROSCOPY;  Surgeon: Bernestine Amass, MD;  [facility];  Service: Urology;  Laterality: Left;   URETEROSCOPY WITH HOLMIUM LASER  LITHOTRIPSY Bilateral 01/15/2018   Procedure: CYSTOSCOPY/BILATERAL URETEROSCOPY WITH HOLMIUM LASER LITHOTRIPSY STONE REMOVAL;  Surgeon: Ardis Hughs, MD;  [facility];  Service: Urology;  Laterality: Bilateral;    Family History  Problem Relation Age of Onset   Hypertension Father    Diabetes Father    Lung cancer Father    Heart attack Father        MI at age 43   Hypertension Mother    Hyperthyroidism Mother    Heart disease Mother    Heart attack Mother        MI at age 15   Asthma Brother    Hypertension Brother        younger   Heart disease Brother        older   Colon cancer Neg Hx    Esophageal cancer Neg Hx    Stomach cancer Neg Hx    Kidney disease Neg Hx    Liver disease Neg Hx     Allergies  Allergen Reactions   Other Anaphylaxis    Reaction to tree nuts   Amoxicillin-Pot Clavulanate Other (See Comments)    headache   Ciprofloxacin Nausea Only and Rash   Food Hives and Other (See Comments)    Potato   Keflex [Cephalexin] Rash   Penicillins Rash    Headache. And hives - Dose not tolerate Cephalosporin either   Has patient had a PCN reaction causing immediate rash, facial/tongue/throat swelling, SOB or lightheadedness with hypotension: No Has patient had a PCN reaction causing severe rash involving mucus membranes or skin necrosis: No Has patient had a PCN reaction that required hospitalization: No Has patient had a PCN reaction occurring within the last 10 years: Yes (reaction was May 2020) If all of the above answers are "NO", then may proce   Rocephin [Ceftriaxone] Rash   Tomato Hives    Current Outpatient Medications on File Prior  to Visit  Medication Sig Dispense Refill   acetaminophen (TYLENOL) 325 MG tablet Take 2 tablets (650 mg total) by mouth every 6 (six) hours as needed for mild pain or moderate pain. 60 tablet 0   albuterol (PROVENTIL HFA;VENTOLIN HFA) 108 (90 Base) MCG/ACT inhaler Inhale 2 puffs into the  lungs every 6 (six) hours as needed for wheezing or shortness of breath. 1 Inhaler 5   albuterol (PROVENTIL) (2.5 MG/3ML) 0.083% nebulizer solution Take 3 mLs (2.5 mg total) by nebulization every 6 (six) hours as needed for wheezing or shortness of breath. 150 mL 1   calcitRIOL (ROCALTROL) 0.5 MCG capsule Take 2 mcg by mouth daily.      calcium elemental as carbonate (TUMS ULTRA 1000) 400 MG chewable tablet Chew 2,000 mg by mouth 2 (two) times daily.     cyanocobalamin (,VITAMIN B-12,) 1000 MCG/ML injection Inject 1 mL (1,000 mcg total) into the skin every 30 (thirty) days. 3 mL 4   desoximetasone (TOPICORT) 0.05 % cream Apply topically 2 (two) times daily as needed. 60 g 0   diclofenac sodium (VOLTAREN) 1 % GEL Apply 2 g topically 4 (four) times daily. 100 g 3   diltiazem (CARDIZEM CD) 120 MG 24 hr capsule Take 1 capsule (120 mg total) by mouth daily. 30 capsule 0   diphenhydrAMINE (BENADRYL) 25 mg capsule Take 25 mg by mouth every 6 (six) hours as needed for itching.      ELIQUIS 5 MG TABS tablet TAKE 1 TABLET BY MOUTH TWO  TIMES DAILY (Patient taking differently: Take 5 mg by mouth 2 (two) times daily. ) 180 tablet 1   EPINEPHrine (EPIPEN 2-PAK) 0.3 mg/0.3 mL IJ SOAJ injection Inject 0.3 mg into the muscle once as needed for anaphylaxis (severe allergic reaction).      furosemide (LASIX) 40 MG tablet TAKE 2 TABLETS BY MOUTH  DAILY IN THE MORNING AND 1  TABLET IN THE EVENING (Patient taking differently: Take 40-80 mg by mouth See admin instructions. Take 2 tablets (80 mg) by mouth every morning and 1 tablet (40 mg) every evening) 270 tablet 1   hydrochlorothiazide (HYDRODIURIL) 25 MG tablet TAKE 1 TABLET(25 MG) BY MOUTH DAILY IN THE MORNING (Patient taking differently: Take 25 mg by mouth daily with breakfast. ) 30 tablet 3   hydrocortisone cream 1 % Apply topically 4 (four) times daily as needed for itching. 30 g 0   levocetirizine (XYZAL) 5 MG tablet TAKE 1 TABLET BY MOUTH  EVERY  EVENING 90 tablet 3   levothyroxine (SYNTHROID, LEVOTHROID) 100 MCG tablet Take 400 mcg by mouth daily before breakfast.      Melatonin 5 MG TABS Take 5 mg by mouth at bedtime as needed (for sleep).      montelukast (SINGULAIR) 10 MG tablet Take 1 tablet (10 mg total) by mouth at bedtime. 30 tablet 3   mupirocin ointment (BACTROBAN) 2 % Place 1 application into the nose 2 (two) times daily. 22 g 0   nystatin (MYCOSTATIN) 100000 UNIT/ML suspension TAKE 5 ML BY MOUTH FOUR TIMES DAILY (Patient taking differently: Use as directed 5 mLs in the mouth or throat 4 (four) times daily. ) 200 mL 0   nystatin cream (MYCOSTATIN) Apply 1 application topically 2 (two) times daily. 30 g 2   omeprazole (PRILOSEC) 40 MG capsule TAKE 1 CAPSULE BY MOUTH  DAILY (Patient taking differently: Take 40 mg by mouth daily. ) 90 capsule 1   ondansetron (ZOFRAN) 4 MG tablet Take 1 tablet (  4 mg total) by mouth every 8 (eight) hours as needed for nausea or vomiting. 30 tablet 0   potassium chloride 20 MEQ/15ML (10%) SOLN Take 15 mLs (20 mEq total) by mouth 2 (two) times daily. 450 mL 2   senna (SENOKOT) 8.6 MG TABS tablet Take 1 tablet (8.6 mg total) by mouth 2 (two) times daily. 120 tablet 0   SYMBICORT 160-4.5 MCG/ACT inhaler INHALE 2 PUFFS BY MOUTH TWO TIMES DAILY 30.6 g 1   Syringe/Needle, Disp, (SYRINGE 3CC/20GX1") 20G X 1" 3 ML MISC Use monthly as directed 50 each 0   traMADol (ULTRAM) 50 MG tablet TAKE 1 TABLET(50 MG) BY MOUTH EVERY 12 HOURS AS NEEDED (Patient taking differently: Take 50 mg by mouth every 12 (twelve) hours as needed for moderate pain. ) 15 tablet 0   Vitamin D, Ergocalciferol, (DRISDOL) 1.25 MG (50000 UT) CAPS capsule Take 1 capsule (50,000 Units total) by mouth every Monday, Wednesday, and Friday. 39 capsule 1   zafirlukast (ACCOLATE) 20 MG tablet TAKE 1 TABLET BY MOUTH TWO  TIMES DAILY BEFORE MEALS (Patient taking differently: Take 20 mg by mouth 2 (two) times daily before a meal. ) 180  tablet 1   No current facility-administered medications on file prior to visit.     BP (!) 114/55 (BP Location: Right Arm, Patient Position: Sitting, Cuff Size: Large)    Pulse 84    Temp 97.8 F (36.6 C) (Oral)    Resp 16    Wt 262 lb 6.4 oz (119 kg)    LMP 06/11/2012    SpO2 98%    BMI 45.04 kg/m    Objective:   Physical Exam Constitutional:      Appearance: She is well-developed.  Neck:     Musculoskeletal: Neck supple.     Thyroid: No thyromegaly.  Cardiovascular:     Rate and Rhythm: Normal rate and regular rhythm.     Heart sounds: Normal heart sounds. No murmur.  Pulmonary:     Effort: Pulmonary effort is normal. No respiratory distress.     Breath sounds: Normal breath sounds. No wheezing.  Skin:    General: Skin is warm and dry.     Comments: Approximately 1 inch diameter stage II sacral ulcer.  Larger surrounding stage 1 ulcer which appears to also have some mild fungal infection  Neurological:     Mental Status: She is alert and oriented to person, place, and time.  Psychiatric:        Behavior: Behavior normal.        Thought Content: Thought content normal.        Judgment: Judgment normal.           Assessment & Plan:  E. Coli UTI- clinically resolved, Had repeat urine culture today at urology. Will obtain follow lab work. I think that the daily prophylactic antibiotic is a good idea for her given her recent recurrence of UTI.  Hypokalemia-check follow up bmet. Replete as needed.  Debilitation- will request home health PT/OT.  Sacral ulcer- will ask home health RN to apply duoderm, and gel overlay for her home chair.  Discussed importance of frequent position changes,  Will also add nystatin powder bid.

## 2018-12-22 NOTE — Patient Instructions (Signed)
Please complete lab work prior to leaving. You should be contacted about home health (RN and physical therapy) Apply nystatin powder to buttocks twice daily. Try to get up at least once an hour and change your position frequently.

## 2018-12-23 ENCOUNTER — Encounter: Payer: Self-pay | Admitting: Family

## 2018-12-23 ENCOUNTER — Telehealth: Payer: Self-pay | Admitting: Family

## 2018-12-23 ENCOUNTER — Other Ambulatory Visit: Payer: Self-pay | Admitting: *Deleted

## 2018-12-23 MED ORDER — DILTIAZEM HCL ER COATED BEADS 120 MG PO CP24: 120.0000 mg | ORAL_CAPSULE | Freq: Every day | ORAL | 1 refills | Status: DC

## 2018-12-23 MED ORDER — POTASSIUM CHLORIDE 20 MEQ/15ML (10%) PO SOLN: 20.0000 meq | Freq: Three times a day (TID) | ORAL | 5 refills | Status: DC

## 2018-12-23 NOTE — Telephone Encounter (Signed)
Patient advised of results and dose change

## 2018-12-23 NOTE — Telephone Encounter (Signed)
Potassium is low.  Please increase the potassium liquid to 15 ml's three times daily.

## 2019-01-04 ENCOUNTER — Encounter: Payer: Self-pay | Admitting: Family

## 2019-01-06 ENCOUNTER — Telehealth: Payer: Self-pay | Admitting: Family

## 2019-01-06 NOTE — Telephone Encounter (Signed)
Patient advised form order for PT-OT faxed to [facility] again. Per [facility] representative they didn't received the fax that was sent and confirmed on 12/07/18

## 2019-01-06 NOTE — Telephone Encounter (Signed)
Copied from [place] [phone]. Topic: General - Other >> Jan 06, 2019 10:42 AM Keene Breath wrote: Reason for CRM: Alvis Lemmings is returning a call to Rod Holler regarding the patient.  Please call back at [phone]

## 2019-01-06 NOTE — Telephone Encounter (Signed)
Talked to [facility] representative and she said the reason they didn't start care on patient in July was because patient refused. They also said they do not have anything available at this time to provide care for patient.

## 2019-01-07 NOTE — Telephone Encounter (Signed)
New referral was started for [facility]. All pertaining information faxed to [phone]

## 2019-01-11 ENCOUNTER — Other Ambulatory Visit: Payer: Self-pay | Admitting: Family

## 2019-01-11 MED ORDER — HYDROCHLOROTHIAZIDE 25 MG PO TABS: ORAL_TABLET | ORAL | 3 refills | Status: DC

## 2019-01-11 NOTE — Telephone Encounter (Signed)
hydrochlorothiazide (HYDRODIURIL) 25 MG tablet  [id number] - [place], [place] - [address] [phone] (Phone) [phone] (Fax)   Note pt has been out for 7 day as soon possible

## 2019-01-13 ENCOUNTER — Other Ambulatory Visit: Payer: Self-pay | Admitting: Family

## 2019-01-13 ENCOUNTER — Telehealth: Payer: Self-pay | Admitting: Family

## 2019-01-13 ENCOUNTER — Telehealth: Payer: Self-pay

## 2019-01-13 NOTE — Telephone Encounter (Signed)
[name] from [facility] is requesting ov notes for 12/22/2018 hospital follow up.  Patient has been on FMLA 12 weeks. And is requesting an extension. And is  At risk for job termination.  [name] has called previously called and sent fax request for medical records. With no response.  [name] is reaching out as a final attempt for medical records for the patient.  [name] is only in need of the 12/22/2018 ov notes.  Please advise- [phone] Direct Line [email or web address] Fax- [phone]

## 2019-01-13 NOTE — Telephone Encounter (Signed)
Received a call from a representative of [facility] to let us know they do not have any nurses available at this time to cover [name]'s zip code area.  I had sent a referral to them after [facility] refused referral due to similar reasons.   Patient has been notified, [facility] said they will keep the referral on file in case someone becomes available for services.

## 2019-01-14 NOTE — Telephone Encounter (Signed)
Office visit note requested, faxed to number provided.

## 2019-01-14 NOTE — Telephone Encounter (Signed)
Vitamin D last drawn 10/20/18. Please review and advise refill request?

## 2019-01-20 ENCOUNTER — Other Ambulatory Visit: Payer: Self-pay

## 2019-01-21 ENCOUNTER — Ambulatory Visit: Payer: 59 | Admitting: Family

## 2019-01-21 ENCOUNTER — Encounter: Payer: Self-pay | Admitting: Family

## 2019-01-21 VITALS — BP 119/59 | HR 86 | Temp 98.3°F | Resp 16

## 2019-01-21 DIAGNOSIS — E559 Vitamin D deficiency, unspecified: Secondary | ICD-10-CM | POA: Diagnosis not present

## 2019-01-21 DIAGNOSIS — R5381 Other malaise: Secondary | ICD-10-CM | POA: Diagnosis not present

## 2019-01-21 DIAGNOSIS — L89302 Pressure ulcer of unspecified buttock, stage 2: Secondary | ICD-10-CM

## 2019-01-21 DIAGNOSIS — L270 Generalized skin eruption due to drugs and medicaments taken internally: Secondary | ICD-10-CM | POA: Diagnosis not present

## 2019-01-21 DIAGNOSIS — Z23 Encounter for immunization: Secondary | ICD-10-CM

## 2019-01-21 DIAGNOSIS — E876 Hypokalemia: Secondary | ICD-10-CM | POA: Diagnosis not present

## 2019-01-21 MED ORDER — NYSTATIN 100000 UNIT/GM EX CREA: 1.0000 "application " | TOPICAL_CREAM | Freq: Two times a day (BID) | CUTANEOUS | 2 refills | Status: DC

## 2019-01-21 NOTE — Progress Notes (Signed)
Subjective:    Patient ID: [name], female    DOB: 30-Dec-1955, 63 y.o.   MRN: [id number]  HPI    Patient is a 63 year old female who presents today for follow-up.  We last saw the patient on 12/22/2018.  This was following an admission on 7/19 for E. coli UTI/pyelonephritis.   Sacral ulcer-we last saw her on 12/22/2018.  She was noted to have a stage II ulcer in the sacral area at that time.  We ordered home health physical therapy occupational therapy and RN.  She initially declined home health PT OT due to early appointment request.  Our office has tried several other home health agencies and were told that they are not currently accepting other patients.  Offered to continue to work on finding another company and patient declines at this time.  Facial rash-Reports that urology gave her an antibiotic for UTI prophylaxis.  About 10 days into taking this medication she developed a facial rash.  She continues to have rash today.    She hopes  to try to return to work on 02/08/19.    Review of Systems   See HPI  Past Medical History:  Diagnosis Date  . Abnormal Pap smear    years ago/no biopsy  . Allergic rhinitis   . Anemia, iron deficiency    iron infusion scheduled for 04/02/2018  . Aortic atherosclerosis ([place])   . Arthritis    back- lower  . Asthma   . Atrial fibrillation ([place]) August 2012   OFF XARELTO LAST MONTH DUE TO BLEEDING IN STOOL  . Bursitis of hip left  . Chronic diastolic (congestive) heart failure (HCC)    pt unaware of this  . COPD (chronic obstructive pulmonary disease) with chronic bronchitis ([place])   . Decreased pulses in feet   . Dysrhythmia    afib, followed by Dr. Stanford Breed   . Edema of both legs   . Esophagitis 02/02/2014   Distal, linear erosions, noted on endoscopy  . Fatty liver 01/07/2012  . Fibroid 1974   fibroid cyst on left fallopian tube  . Fibromyalgia   . Foot ulcer ([place])    AREA HEALED RIGHT FOOT  . GERD (gastroesophageal reflux  disease)   . Headache(784.0)    occasional sinus headache   . Hepatomegaly   . History of blood transfusion JULY 2015  . History of cardiomegaly 12/06/2010   Noted on CT  . History of E. coli septicemia   . Hypertension   . Hypoparathyroidism ([place]) 01/07/2011  . Hypothyroidism   . Kidney stone 09-26-2015   passed on their own  . Morbid obesity ([place])   . MRSA infection   . Nephrolithiasis 2/16, 9/16   SEES DR Risa Grill  . Pernicious anemia 02/20/2014   followed by [facility]  . Pneumonia   . PONV (postoperative nausea and vomiting)   . Pyelonephritis 11/24/2018  . Rash    thighs and back  . Seizures ([place])    infancy secondary to fever  . Staph aureus infection   . Thoracic spondylosis 12/06/2010   Noted on CT  . Thyroid cancer ([place]) 2011   THYROIDECTOMY DONE  . Uterine cancer ([place]) 2014   Mirena IUD  . Vitamin B 12 deficiency   . Vitamin D deficiency      Social History   Socioeconomic History  . Marital status: Single    Spouse name: Not on file  . Number of children: 0  . Years of education:  Not on file  . Highest education level: Not on file  Occupational History  . Occupation: works in Insurance claims handler  . Occupation: DESIGN COMPUTER CHIP    Employer: [employer]  Social Needs  . Financial resource strain: Not on file  . Food insecurity    Worry: Not on file    Inability: Not on file  . Transportation needs    Medical: Not on file    Non-medical: Not on file  Tobacco Use  . Smoking status: Former Smoker    Packs/day: 0.50    Years: 25.00    Pack years: 12.50    Types: Cigarettes    Start date: 12/24/1970    Quit date: 05/27/1995    Years since quitting: 23.6  . Smokeless tobacco: Never Used  . Tobacco comment: quit smoking 19 years ago  Substance and Sexual Activity  . Alcohol use: Yes    Alcohol/week: 0.0 standard drinks    Comment: 1/2 glass per month  . Drug use: No  . Sexual activity: Yes    Partners: Male    Birth control/protection:  Post-menopausal  Lifestyle  . Physical activity    Days per week: Not on file    Minutes per session: Not on file  . Stress: Not on file  Relationships  . Social Herbalist on phone: Not on file    Gets together: Not on file    Attends religious service: Not on file    Active member of club or organization: Not on file    Attends meetings of clubs or organizations: Not on file    Relationship status: Not on file  . Intimate partner violence    Fear of current or ex partner: Not on file    Emotionally abused: Not on file    Physically abused: Not on file    Forced sexual activity: Not on file  Other Topics Concern  . Not on file  Social History Narrative   Occupation: works in Insurance claims handler - Field seismologist   Single       Former Smoker - quit tobacco 12 years ago.  She was light smoker for 10 years.                 Past Surgical History:  Procedure Laterality Date  . AMPUTATION Right 05/18/2014   Procedure: RIGHT FIFTH RAY AMPUTATION FOOT;  Surgeon: Wylene Simmer, MD;  Location: [facility];  Service: Orthopedics;  Laterality: Right;  . APPENDECTOMY    . BUNIONECTOMY     bilateral  . COLONOSCOPY WITH PROPOFOL N/A 02/02/2014   Procedure: COLONOSCOPY WITH PROPOFOL;  Surgeon: Irene Shipper, MD;  [facility];  Service: Endoscopy;  Laterality: N/A;  . cyst on ovary removed     . CYSTOSCOPY W/ RETROGRADES  03/03/2012   Procedure: CYSTOSCOPY WITH RETROGRADE PYELOGRAM;  Surgeon: Bernestine Amass, MD;  [facility]: [facility];  Service: Urology;  Laterality: Bilateral;  . CYSTOSCOPY W/ URETERAL STENT PLACEMENT Right 11/25/2017   Procedure: CYSTOSCOPY WITH RETROGRADE RIGHT URETERAL STENT PLACEMENT;  Surgeon: Franchot Gallo, MD;  Location: [facility];  Service: Urology;  Laterality: Right;  . CYSTOSCOPY W/ URETERAL STENT PLACEMENT Right 01/15/2018   Procedure: RIGHT STENT EXCHANGE;  Surgeon: Ardis Hughs, MD;  [facility];  Service: Urology;  Laterality: Right;   . CYSTOSCOPY WITH RETROGRADE PYELOGRAM, URETEROSCOPY AND STENT PLACEMENT Left 08/25/2012   Procedure: CYSTOSCOPY WITH RETROGRADE PYELOGRAM, URETEROSCOPY;  Surgeon: Bernestine Amass, MD;  [facility];  Service: Urology;  Laterality: Left;  . CYSTOSCOPY WITH STENT PLACEMENT Left 01/15/2018   Procedure: LEFT STENT PLACEMENT;  Surgeon: Ardis Hughs, MD;  [facility];  Service: Urology;  Laterality: Left;  . CYSTOSCOPY WITH STENT PLACEMENT Right 01/22/2018   Procedure: RIGHT STENT REMOVAL;  Surgeon: Ardis Hughs, MD;  [facility];  Service: Urology;  Laterality: Right;  . CYSTOSCOPY/URETEROSCOPY/HOLMIUM LASER/STENT PLACEMENT Right 12/23/2017   Procedure: RIGHT URETEROSCOPY STONE REMOVAL, HOLMIUM LASER, RIGHT /STENT EXCHANGE;  Surgeon: Ardis Hughs, MD;  [facility];  Service: Urology;  Laterality: Right;  . CYSTOSCOPY/URETEROSCOPY/HOLMIUM LASER/STENT PLACEMENT Left 01/22/2018   Procedure: LEFT URETEROSCOPY STONE REMOVAL HOLMIUM LASER LEFT STENT Freddi Starr;  Surgeon: Ardis Hughs, MD;  [facility];  Service: Urology;  Laterality: Left;  . DILATION AND CURETTAGE OF UTERUS N/A 02/21/2013   Procedure: DILATATION AND CURETTAGE;  Surgeon: Lyman Speller, MD;  Location: [facility];  Service: Gynecology;  Laterality: N/A;  . DILATION AND CURETTAGE OF UTERUS N/A 04/09/2015   Procedure: [place] IUD removal;  Surgeon: Megan Salon, MD;  Location: [facility];  Service: Gynecology;  Laterality: N/A;  Patient weight 307lbs  . ESOPHAGOGASTRODUODENOSCOPY (EGD) WITH PROPOFOL N/A 02/02/2014   Procedure: ESOPHAGOGASTRODUODENOSCOPY (EGD) WITH PROPOFOL;  Surgeon: Irene Shipper, MD;  [facility];  Service: Endoscopy;  Laterality: N/A;  . GASTRIC BYPASS  1974  . HOLMIUM LASER APPLICATION Left 0000000   Procedure: HOLMIUM LASER APPLICATION;  Surgeon: Bernestine Amass, MD;  [facility];  Service: Urology;  Laterality: Left;  .  LITHOTRIPSY  03/2012  . LITHOTRIPSY  2/16  . THYROIDECTOMY  05/15/2010  . TONSILLECTOMY AND ADENOIDECTOMY    . URETEROSCOPY  03/03/2012   Procedure: URETEROSCOPY;  Surgeon: Bernestine Amass, MD;  [facility];  Service: Urology;  Laterality: Left;  . URETEROSCOPY WITH HOLMIUM LASER LITHOTRIPSY Bilateral 01/15/2018   Procedure: CYSTOSCOPY/BILATERAL URETEROSCOPY WITH HOLMIUM LASER LITHOTRIPSY STONE REMOVAL;  Surgeon: Ardis Hughs, MD;  [facility];  Service: Urology;  Laterality: Bilateral;    Family History  Problem Relation Age of Onset  . Hypertension Father   . Diabetes Father   . Lung cancer Father   . Heart attack Father        MI at age 81  . Hypertension Mother   . Hyperthyroidism Mother   . Heart disease Mother   . Heart attack Mother        MI at age 35  . Asthma Brother   . Hypertension Brother        younger  . Heart disease Brother        older  . Colon cancer Neg Hx   . Esophageal cancer Neg Hx   . Stomach cancer Neg Hx   . Kidney disease Neg Hx   . Liver disease Neg Hx     Allergies  Allergen Reactions  . Other Anaphylaxis    Reaction to tree nuts  . Macrobid [Nitrofurantoin Macrocrystal]     Skin rash  . Amoxicillin-Pot Clavulanate Other (See Comments)    headache  . Ciprofloxacin Nausea Only and Rash  . Food Hives and Other (See Comments)    Potato  . Keflex [Cephalexin] Rash  . Penicillins Rash    Headache. And hives - Dose not tolerate Cephalosporin either   Has patient had a PCN reaction causing immediate rash, facial/tongue/throat swelling, SOB or lightheadedness with hypotension: No Has patient had  a PCN reaction causing severe rash involving mucus membranes or skin necrosis: No Has patient had a PCN reaction that required hospitalization: No Has patient had a PCN reaction occurring within the last 10 years: Yes (reaction was May 2020) If all of the above answers are "NO", then may proce  . Rocephin [Ceftriaxone] Rash  . Tomato  Hives    Current Outpatient Medications on File Prior to Visit  Medication Sig Dispense Refill  . acetaminophen (TYLENOL) 325 MG tablet Take 2 tablets (650 mg total) by mouth every 6 (six) hours as needed for mild pain or moderate pain. 60 tablet 0  . albuterol (PROVENTIL HFA;VENTOLIN HFA) 108 (90 Base) MCG/ACT inhaler Inhale 2 puffs into the lungs every 6 (six) hours as needed for wheezing or shortness of breath. 1 Inhaler 5  . albuterol (PROVENTIL) (2.5 MG/3ML) 0.083% nebulizer solution Take 3 mLs (2.5 mg total) by nebulization every 6 (six) hours as needed for wheezing or shortness of breath. 150 mL 1  . calcitRIOL (ROCALTROL) 0.5 MCG capsule Take 2 mcg by mouth daily.     . calcium elemental as carbonate (TUMS ULTRA 1000) 400 MG chewable tablet Chew 2,000 mg by mouth 2 (two) times daily.    . cyanocobalamin (,VITAMIN B-12,) 1000 MCG/ML injection Inject 1 mL (1,000 mcg total) into the skin every 30 (thirty) days. 3 mL 4  . desoximetasone (TOPICORT) 0.05 % cream Apply topically 2 (two) times daily as needed. 60 g 0  . diclofenac sodium (VOLTAREN) 1 % GEL Apply 2 g topically 4 (four) times daily. 100 g 3  . diltiazem (CARDIZEM CD) 120 MG 24 hr capsule Take 1 capsule (120 mg total) by mouth daily. 90 capsule 1  . diphenhydrAMINE (BENADRYL) 25 mg capsule Take 25 mg by mouth every 6 (six) hours as needed for itching.     [name] ELIQUIS 5 MG TABS tablet TAKE 1 TABLET BY MOUTH TWO  TIMES DAILY (Patient taking differently: Take 5 mg by mouth 2 (two) times daily. ) 180 tablet 1  . EPINEPHrine (EPIPEN 2-PAK) 0.3 mg/0.3 mL IJ SOAJ injection Inject 0.3 mg into the muscle once as needed for anaphylaxis (severe allergic reaction).     . furosemide (LASIX) 40 MG tablet TAKE 2 TABLETS BY MOUTH  DAILY IN THE MORNING AND 1  TABLET IN THE EVENING (Patient taking differently: Take 40-80 mg by mouth See admin instructions. Take 2 tablets (80 mg) by mouth every morning and 1 tablet (40 mg) every evening) 270 tablet 1  .  hydrochlorothiazide (HYDRODIURIL) 25 MG tablet TAKE 1 TABLET(25 MG) BY MOUTH DAILY IN THE MORNING 30 tablet 3  . hydrocortisone cream 1 % Apply topically 4 (four) times daily as needed for itching. 30 g 0  . levocetirizine (XYZAL) 5 MG tablet TAKE 1 TABLET BY MOUTH  EVERY EVENING 90 tablet 3  . levothyroxine (SYNTHROID, LEVOTHROID) 100 MCG tablet Take 400 mcg by mouth daily before breakfast.     . Melatonin 5 MG TABS Take 5 mg by mouth at bedtime as needed (for sleep).     . montelukast (SINGULAIR) 10 MG tablet Take 1 tablet (10 mg total) by mouth at bedtime. 30 tablet 3  . mupirocin ointment (BACTROBAN) 2 % Place 1 application into the nose 2 (two) times daily. 22 g 0  . nystatin (MYCOSTATIN) 100000 UNIT/ML suspension TAKE 5 ML BY MOUTH FOUR TIMES DAILY (Patient taking differently: Use as directed 5 mLs in the mouth or throat 4 (four) times daily. )  200 mL 0  . nystatin (MYCOSTATIN/NYSTOP) powder Apply topically 2 (two) times daily. 45 g 1  . nystatin cream (MYCOSTATIN) Apply 1 application topically 2 (two) times daily. 30 g 2  . omeprazole (PRILOSEC) 40 MG capsule TAKE 1 CAPSULE BY MOUTH  DAILY (Patient taking differently: Take 40 mg by mouth daily. ) 90 capsule 1  . ondansetron (ZOFRAN) 4 MG tablet Take 1 tablet (4 mg total) by mouth every 8 (eight) hours as needed for nausea or vomiting. 30 tablet 0  . potassium chloride 20 MEQ/15ML (10%) SOLN Take 15 mLs (20 mEq total) by mouth 3 (three) times daily. 450 mL 5  . senna (SENOKOT) 8.6 MG TABS tablet Take 1 tablet (8.6 mg total) by mouth 2 (two) times daily. 120 tablet 0  . SYMBICORT 160-4.5 MCG/ACT inhaler INHALE 2 PUFFS BY MOUTH TWO TIMES DAILY 30.6 g 1  . Syringe/Needle, Disp, (SYRINGE 3CC/20GX1") 20G X 1" 3 ML MISC Use monthly as directed 50 each 0  . traMADol (ULTRAM) 50 MG tablet TAKE 1 TABLET(50 MG) BY MOUTH EVERY 12 HOURS AS NEEDED (Patient taking differently: Take 50 mg by mouth every 12 (twelve) hours as needed for moderate pain. ) 15  tablet 0  . Vitamin D, Ergocalciferol, (DRISDOL) 1.25 MG (50000 UT) CAPS capsule TAKE 1 CAPSULE BY MOUTH  EVERY MONDAY, WEDNESDAY,  AND FRIDAY. 39 capsule 1  . zafirlukast (ACCOLATE) 20 MG tablet TAKE 1 TABLET BY MOUTH TWO  TIMES DAILY BEFORE MEALS (Patient taking differently: Take 20 mg by mouth 2 (two) times daily before a meal. ) 180 tablet 1   No current facility-administered medications on file prior to visit.     BP (!) 119/59 (BP Location: Right Arm, Patient Position: Sitting, Cuff Size: Large)   Pulse 86   Temp 98.3 F (36.8 C) (Oral)   Resp 16   LMP 06/11/2012   SpO2 97%       Objective:   Physical Exam Constitutional:      Appearance: She is well-developed.  Neck:     Musculoskeletal: Neck supple.     Thyroid: No thyromegaly.  Cardiovascular:     Rate and Rhythm: Normal rate and regular rhythm.     Heart sounds: Normal heart sounds. No murmur.  Pulmonary:     Effort: Pulmonary effort is normal. No respiratory distress.     Breath sounds: Normal breath sounds. No wheezing.  Skin:    General: Skin is warm and dry.     Comments: Macular rash noted on bilateral cheeks.  She does no longer have a sacral wound.  She does have 2 small stage II ulcers noted at the top of left and right buttock both approximately 1 inch in length and 1/2 inch in width   Neurological:     Mental Status: She is alert and oriented to person, place, and time.  Psychiatric:        Behavior: Behavior normal.        Thought Content: Thought content normal.        Judgment: Judgment normal.           Assessment & Plan:  Hypokalemia-will obtain follow-up basic metabolic panel today.  Vitamin D deficiency- she is maintained on vitamin D supplement.  She is due for follow-up vitamin D level today.  Sacral ulcer- I have given her a handwritten prescription for DuoDERM dressing to be applied every 3 days.  She is advised to cut the dressing in half and applied each  buttock wound.  She can  have her family help her to apply this to clean dry skin.  We discussed the importance of a protein rich diet to help with wound healing.  Debilitation- she is able to ambulate now with her walker.  She remains fearful of falling.  She continues to slowly regain strength.  Declines PT OT at this time.  We also discussed outpatient PT OT but she reports that she does not currently drive and would not have transportation.  She plans to return to work on 915 and I have given her return to work note.  Drug rash-I advised the patient to discontinue nitrofurantoin and contact her urologist to notify him so he can change her to a different medication.  Nitrofurantoin was added to her allergy list.

## 2019-01-21 NOTE — Patient Instructions (Signed)
Apply duoderm to buttock wounds every 3 days. Make sure you are eating a diet rich in lean protein to help with wound healing.

## 2019-01-21 NOTE — Addendum Note (Signed)
Addended by: Jiles Prows on: 01/21/2019 02:22 PM   Modules accepted: Orders

## 2019-01-24 LAB — VITAMIN D 1,25 DIHYDROXY
Vitamin D 1, 25 (OH)2 Total: 56 pg/mL (ref 18–72)
Vitamin D2 1, 25 (OH)2: <8 pg/mL
Vitamin D3 1, 25 (OH)2: 56 pg/mL

## 2019-01-24 LAB — BASIC METABOLIC PANEL
BUN: 25 mg/dL (ref 7–25)
CO2: 30 mmol/L (ref 20–32)
Calcium: 9.4 mg/dL (ref 8.6–10.4)
Chloride: 94 mmol/L — ABNORMAL LOW (ref 98–110)
Creat: 0.79 mg/dL (ref 0.50–0.99)
Glucose, Bld: 94 mg/dL (ref 65–99)
Potassium: 3.2 mmol/L — ABNORMAL LOW (ref 3.5–5.3)
Sodium: 138 mmol/L (ref 135–146)

## 2019-01-26 ENCOUNTER — Encounter: Payer: Self-pay | Admitting: Family

## 2019-01-26 ENCOUNTER — Telehealth: Payer: Self-pay | Admitting: Family

## 2019-01-26 MED ORDER — POTASSIUM CHLORIDE 20 MEQ/15ML (10%) PO SOLN: ORAL | 5 refills | Status: DC

## 2019-01-26 NOTE — Telephone Encounter (Signed)
Called patient but no answer and vm not coming up. Will try later.

## 2019-01-26 NOTE — Telephone Encounter (Signed)
Please advise pt that vit D level looks good. Potassium is still a bit low. Please increase her potassium from 29ml's 3x daily to 20 ml's 3x daily and repeat bmet in 1 week.

## 2019-01-27 ENCOUNTER — Telehealth: Payer: Self-pay

## 2019-01-27 ENCOUNTER — Telehealth: Payer: Self-pay | Admitting: Family

## 2019-01-27 NOTE — Telephone Encounter (Signed)
Patient's brother Levada Nemecek) came into the office to drop off a Short term disability form to be completed by PCP and faxed. Please call when completed to inform patient at [phone]. Placed in provider tray.

## 2019-01-27 NOTE — Telephone Encounter (Signed)
lvm today for patient to call back about her potassium levels.

## 2019-01-27 NOTE — Telephone Encounter (Signed)
Denial for Tramadol refill faxed to pharmacy, patient needs appt

## 2019-01-27 NOTE — Telephone Encounter (Signed)
Form placed on provider's folder 

## 2019-01-28 ENCOUNTER — Other Ambulatory Visit: Payer: Self-pay

## 2019-01-28 DIAGNOSIS — E876 Hypokalemia: Secondary | ICD-10-CM

## 2019-01-28 NOTE — Telephone Encounter (Signed)
Advised patient of results and dose change. She was also scheduled for bmet on 02-04-2019

## 2019-02-02 NOTE — Telephone Encounter (Signed)
Pt called to see if forms were completed. She asked to be called when completed so that she can pick them up. Please advise

## 2019-02-03 ENCOUNTER — Telehealth: Payer: Self-pay

## 2019-02-03 ENCOUNTER — Encounter: Payer: Self-pay | Admitting: Family

## 2019-02-03 DIAGNOSIS — R2681 Unsteadiness on feet: Secondary | ICD-10-CM

## 2019-02-03 DIAGNOSIS — I358 Other nonrheumatic aortic valve disorders: Secondary | ICD-10-CM

## 2019-02-03 NOTE — Telephone Encounter (Signed)
Melissa have these forms been completed?

## 2019-02-03 NOTE — Telephone Encounter (Signed)
Copied from [place] [id number]. Topic: General - Other >> Feb 03, 2019 10:34 AM Reyne Dumas L wrote: Reason for CRM:   Pt called and states she is not ready to go back to work and wants to know if [facility] would be willing to put her out longer.  Pt states that the paperwork is already at the office. Pt would like to be held out for another month. Pt can be reached at [phone]  Pt was already given information yesterday that Dr Conley Canal. Pt sent my chart message.

## 2019-02-04 ENCOUNTER — Other Ambulatory Visit: Payer: 59

## 2019-02-04 NOTE — Telephone Encounter (Signed)
Please contact pt and let her know that I have completed her paperwork.  I have made not that she is not able to return physically from work but that home computer work is reasonable if they are able to accommodate her on this. In addition I think it is very important that she do PT/OT so that she can improve her ambulation and balance and get back into the office. Let me know if she is willing and we can look again at home health PT/OT.  I would like to see her back in the office in 2 weeks please.

## 2019-02-04 NOTE — Telephone Encounter (Signed)
Called patient, notified of instructions. She states she had been trying PT/OT over the past months but was unable to get a physical therapist to come out to her house as she is unable to walk. I have faxed paperwork with fax number on the form and placed in scan as requested by patient.

## 2019-02-14 ENCOUNTER — Other Ambulatory Visit: Payer: Self-pay

## 2019-02-15 ENCOUNTER — Ambulatory Visit: Payer: 59 | Admitting: Family

## 2019-02-15 VITALS — BP 122/78 | HR 102 | Temp 97.5°F | Resp 16

## 2019-02-15 DIAGNOSIS — L98491 Non-pressure chronic ulcer of skin of other sites limited to breakdown of skin: Secondary | ICD-10-CM

## 2019-02-15 DIAGNOSIS — E876 Hypokalemia: Secondary | ICD-10-CM | POA: Diagnosis not present

## 2019-02-15 DIAGNOSIS — R269 Unspecified abnormalities of gait and mobility: Secondary | ICD-10-CM | POA: Diagnosis not present

## 2019-02-15 DIAGNOSIS — D649 Anemia, unspecified: Secondary | ICD-10-CM | POA: Diagnosis not present

## 2019-02-15 DIAGNOSIS — M25511 Pain in right shoulder: Secondary | ICD-10-CM | POA: Diagnosis not present

## 2019-02-15 MED ORDER — TRAMADOL HCL 50 MG PO TABS: ORAL_TABLET | ORAL | 0 refills | Status: DC

## 2019-02-15 NOTE — Progress Notes (Signed)
Subjective:    Patient ID: [name], female    DOB: [birth year], 63 y.o.   MRN: [id number]  HPI   Patient is a 63 yr old female who presents today for follow up.  She had a hospitalization back in July for pyelonephritis which caused some generalized weakness.  Since that time, she has experienced ongoing weakness.  She also reports that she is extremely unsteady on her feet and has extreme fear of falling.  She does not feel safe driving.  HTN-  BP Readings from Last 3 Encounters:  02/15/19 122/78  01/21/19 (!) 119/59  12/22/18 (!) 114/55   Asthma/allergies- reports increased allergy symptoms. On xyzal, uses rescue inhaler. She continues singulair.   Reports some pain in the sacral region   R shoulder pain- has been bothering her for a few weeks.  Requesting something for pain.  Review of Systems  See HPI       Past Medical History:  Diagnosis Date  . Abnormal Pap smear    years ago/no biopsy  . Allergic rhinitis   . Anemia, iron deficiency    iron infusion scheduled for 04/02/2018  . Aortic atherosclerosis ([place])   . Arthritis    back- lower  . Asthma   . Atrial fibrillation ([place]) August 2012   OFF XARELTO LAST MONTH DUE TO BLEEDING IN STOOL  . Bursitis of hip left  . Chronic diastolic (congestive) heart failure (HCC)    pt unaware of this  . COPD (chronic obstructive pulmonary disease) with chronic bronchitis ([facility])   . Decreased pulses in feet   . Dysrhythmia    afib, followed by Dr. Stanford Breed   . Edema of both legs   . Esophagitis 02/02/2014   Distal, linear erosions, noted on endoscopy  . Fatty liver 01/07/2012  . Fibroid 1974   fibroid cyst on left fallopian tube  . Fibromyalgia   . Foot ulcer ([place])    AREA HEALED RIGHT FOOT  . GERD (gastroesophageal reflux disease)   . Headache(784.0)    occasional sinus headache   . Hepatomegaly   . History of blood transfusion JULY 2015  . History of cardiomegaly 12/06/2010   Noted on CT  . History of E.  coli septicemia   . Hypertension   . Hypoparathyroidism ([place]) 01/07/2011  . Hypothyroidism   . Kidney stone Sep 23, 2015   passed on their own  . Morbid obesity ([place])   . MRSA infection   . Nephrolithiasis 2/16, 9/16   SEES DR Risa Grill  . Pernicious anemia 02/20/2014   followed by Debbrah Alar  . Pneumonia   . PONV (postoperative nausea and vomiting)   . Pyelonephritis 11/24/2018  . Rash    thighs and back  . Seizures ([place])    infancy secondary to fever  . Staph aureus infection   . Thoracic spondylosis 12/06/2010   Noted on CT  . Thyroid cancer ([place]) 2011   THYROIDECTOMY DONE  . Uterine cancer ([place]) 2014   Mirena IUD  . Vitamin B 12 deficiency   . Vitamin D deficiency      Social History   Socioeconomic History  . Marital status: Single    Spouse name: Not on file  . Number of children: 0  . Years of education: Not on file  . Highest education level: Not on file  Occupational History  . Occupation: works in Insurance claims handler  . Occupation: DESIGN COMPUTER CHIP    Employer: [employer]  Social Needs  .  Financial resource strain: Not on file  . Food insecurity    Worry: Not on file    Inability: Not on file  . Transportation needs    Medical: Not on file    Non-medical: Not on file  Tobacco Use  . Smoking status: Former Smoker    Packs/day: 0.50    Years: 25.00    Pack years: 12.50    Types: Cigarettes    Start date: 12/24/1970    Quit date: 05/27/1995    Years since quitting: 23.7  . Smokeless tobacco: Never Used  . Tobacco comment: quit smoking 19 years ago  Substance and Sexual Activity  . Alcohol use: Yes    Alcohol/week: 0.0 standard drinks    Comment: 1/2 glass per month  . Drug use: No  . Sexual activity: Yes    Partners: Male    Birth control/protection: Post-menopausal  Lifestyle  . Physical activity    Days per week: Not on file    Minutes per session: Not on file  . Stress: Not on file  Relationships  . Social Herbalist on  phone: Not on file    Gets together: Not on file    Attends religious service: Not on file    Active member of club or organization: Not on file    Attends meetings of clubs or organizations: Not on file    Relationship status: Not on file  . Intimate partner violence    Fear of current or ex partner: Not on file    Emotionally abused: Not on file    Physically abused: Not on file    Forced sexual activity: Not on file  Other Topics Concern  . Not on file  Social History Narrative   Occupation: works in Insurance claims handler - Field seismologist   Single       Former Smoker - quit tobacco 12 years ago.  She was light smoker for 10 years.                 Past Surgical History:  Procedure Laterality Date  . AMPUTATION Right 05/18/2014   Procedure: RIGHT FIFTH RAY AMPUTATION FOOT;  Surgeon: Wylene Simmer, MD;  Location: [facility];  Service: Orthopedics;  Laterality: Right;  . APPENDECTOMY    . BUNIONECTOMY     bilateral  . COLONOSCOPY WITH PROPOFOL N/A 02/02/2014   Procedure: COLONOSCOPY WITH PROPOFOL;  Surgeon: Irene Shipper, MD;  [facility];  Service: Endoscopy;  Laterality: N/A;  . cyst on ovary removed     . CYSTOSCOPY W/ RETROGRADES  03/03/2012   Procedure: CYSTOSCOPY WITH RETROGRADE PYELOGRAM;  Surgeon: Bernestine Amass, MD;  [facility]: [facility];  Service: Urology;  Laterality: Bilateral;  . CYSTOSCOPY W/ URETERAL STENT PLACEMENT Right 11/25/2017   Procedure: CYSTOSCOPY WITH RETROGRADE RIGHT URETERAL STENT PLACEMENT;  Surgeon: Franchot Gallo, MD;  Location: [facility];  Service: Urology;  Laterality: Right;  . CYSTOSCOPY W/ URETERAL STENT PLACEMENT Right 01/15/2018   Procedure: RIGHT STENT EXCHANGE;  Surgeon: Ardis Hughs, MD;  [facility];  Service: Urology;  Laterality: Right;  . CYSTOSCOPY WITH RETROGRADE PYELOGRAM, URETEROSCOPY AND STENT PLACEMENT Left 08/25/2012   Procedure: CYSTOSCOPY WITH RETROGRADE PYELOGRAM, URETEROSCOPY;  Surgeon: Bernestine Amass, MD;  [facility];  Service: Urology;  Laterality: Left;  . CYSTOSCOPY WITH STENT PLACEMENT Left 01/15/2018   Procedure: LEFT STENT PLACEMENT;  Surgeon: Ardis Hughs, MD;  [facility];  Service: Urology;  Laterality: Left;  . CYSTOSCOPY WITH STENT PLACEMENT Right 01/22/2018   Procedure: RIGHT STENT REMOVAL;  Surgeon: Ardis Hughs, MD;  [facility];  Service: Urology;  Laterality: Right;  . CYSTOSCOPY/URETEROSCOPY/HOLMIUM LASER/STENT PLACEMENT Right 12/23/2017   Procedure: RIGHT URETEROSCOPY STONE REMOVAL, HOLMIUM LASER, RIGHT /STENT EXCHANGE;  Surgeon: Ardis Hughs, MD;  [facility];  Service: Urology;  Laterality: Right;  . CYSTOSCOPY/URETEROSCOPY/HOLMIUM LASER/STENT PLACEMENT Left 01/22/2018   Procedure: LEFT URETEROSCOPY STONE REMOVAL HOLMIUM LASER LEFT STENT Freddi Starr;  Surgeon: Ardis Hughs, MD;  [facility];  Service: Urology;  Laterality: Left;  . DILATION AND CURETTAGE OF UTERUS N/A 02/21/2013   Procedure: DILATATION AND CURETTAGE;  Surgeon: Lyman Speller, MD;  Location: [facility];  Service: Gynecology;  Laterality: N/A;  . DILATION AND CURETTAGE OF UTERUS N/A 04/09/2015   Procedure: [place] IUD removal;  Surgeon: Megan Salon, MD;  Location: [facility];  Service: Gynecology;  Laterality: N/A;  Patient weight 307lbs  . ESOPHAGOGASTRODUODENOSCOPY (EGD) WITH PROPOFOL N/A 02/02/2014   Procedure: ESOPHAGOGASTRODUODENOSCOPY (EGD) WITH PROPOFOL;  Surgeon: Irene Shipper, MD;  [facility];  Service: Endoscopy;  Laterality: N/A;  . GASTRIC BYPASS  1974  . HOLMIUM LASER APPLICATION Left 0000000   Procedure: HOLMIUM LASER APPLICATION;  Surgeon: Bernestine Amass, MD;  [facility];  Service: Urology;  Laterality: Left;  . LITHOTRIPSY  03/2012  . LITHOTRIPSY  2/16  . THYROIDECTOMY  05/15/2010  . TONSILLECTOMY AND ADENOIDECTOMY    . URETEROSCOPY  03/03/2012   Procedure: URETEROSCOPY;  Surgeon: Bernestine Amass, MD;   [facility];  Service: Urology;  Laterality: Left;  . URETEROSCOPY WITH HOLMIUM LASER LITHOTRIPSY Bilateral 01/15/2018   Procedure: CYSTOSCOPY/BILATERAL URETEROSCOPY WITH HOLMIUM LASER LITHOTRIPSY STONE REMOVAL;  Surgeon: Ardis Hughs, MD;  [facility];  Service: Urology;  Laterality: Bilateral;    Family History  Problem Relation Age of Onset  . Hypertension Father   . Diabetes Father   . Lung cancer Father   . Heart attack Father        MI at age 20  . Hypertension Mother   . Hyperthyroidism Mother   . Heart disease Mother   . Heart attack Mother        MI at age 38  . Asthma Brother   . Hypertension Brother        younger  . Heart disease Brother        older  . Colon cancer Neg Hx   . Esophageal cancer Neg Hx   . Stomach cancer Neg Hx   . Kidney disease Neg Hx   . Liver disease Neg Hx     Allergies  Allergen Reactions  . Other Anaphylaxis    Reaction to tree nuts  . Macrobid [Nitrofurantoin Macrocrystal]     Skin rash  . Amoxicillin-Pot Clavulanate Other (See Comments)    headache  . Ciprofloxacin Nausea Only and Rash  . Food Hives and Other (See Comments)    Potato  . Keflex [Cephalexin] Rash  . Penicillins Rash    Headache. And hives - Dose not tolerate Cephalosporin either   Has patient had a PCN reaction causing immediate rash, facial/tongue/throat swelling, SOB or lightheadedness with hypotension: No Has patient had a PCN reaction causing severe rash involving mucus membranes or skin necrosis: No Has patient had a PCN reaction that required hospitalization: No Has patient had a PCN reaction occurring within the last 10 years: Yes (reaction  was May 2020) If all of the above answers are "NO", then may proce  . Rocephin [Ceftriaxone] Rash  . Tomato Hives    Current Outpatient Medications on File Prior to Visit  Medication Sig Dispense Refill  . acetaminophen (TYLENOL) 325 MG tablet Take 2 tablets (650 mg total) by mouth every 6 (six) hours  as needed for mild pain or moderate pain. 60 tablet 0  . albuterol (PROVENTIL HFA;VENTOLIN HFA) 108 (90 Base) MCG/ACT inhaler Inhale 2 puffs into the lungs every 6 (six) hours as needed for wheezing or shortness of breath. 1 Inhaler 5  . albuterol (PROVENTIL) (2.5 MG/3ML) 0.083% nebulizer solution Take 3 mLs (2.5 mg total) by nebulization every 6 (six) hours as needed for wheezing or shortness of breath. 150 mL 1  . calcitRIOL (ROCALTROL) 0.5 MCG capsule Take 2 mcg by mouth daily.     . calcium elemental as carbonate (TUMS ULTRA 1000) 400 MG chewable tablet Chew 2,000 mg by mouth 2 (two) times daily.    . cyanocobalamin (,VITAMIN B-12,) 1000 MCG/ML injection Inject 1 mL (1,000 mcg total) into the skin every 30 (thirty) days. 3 mL 4  . desoximetasone (TOPICORT) 0.05 % cream Apply topically 2 (two) times daily as needed. 60 g 0  . diclofenac sodium (VOLTAREN) 1 % GEL Apply 2 g topically 4 (four) times daily. 100 g 3  . diphenhydrAMINE (BENADRYL) 25 mg capsule Take 25 mg by mouth every 6 (six) hours as needed for itching.     [name] ELIQUIS 5 MG TABS tablet TAKE 1 TABLET BY MOUTH TWO  TIMES DAILY (Patient taking differently: Take 5 mg by mouth 2 (two) times daily. ) 180 tablet 1  . EPINEPHrine (EPIPEN 2-PAK) 0.3 mg/0.3 mL IJ SOAJ injection Inject 0.3 mg into the muscle once as needed for anaphylaxis (severe allergic reaction).     . furosemide (LASIX) 40 MG tablet TAKE 2 TABLETS BY MOUTH  DAILY IN THE MORNING AND 1  TABLET IN THE EVENING (Patient taking differently: Take 40-80 mg by mouth See admin instructions. Take 2 tablets (80 mg) by mouth every morning and 1 tablet (40 mg) every evening) 270 tablet 1  . hydrochlorothiazide (HYDRODIURIL) 25 MG tablet TAKE 1 TABLET(25 MG) BY MOUTH DAILY IN THE MORNING 30 tablet 3  . hydrocortisone cream 1 % Apply topically 4 (four) times daily as needed for itching. 30 g 0  . levocetirizine (XYZAL) 5 MG tablet TAKE 1 TABLET BY MOUTH  EVERY EVENING 90 tablet 3  .  levothyroxine (SYNTHROID, LEVOTHROID) 100 MCG tablet Take 400 mcg by mouth daily before breakfast.     . Melatonin 5 MG TABS Take 5 mg by mouth at bedtime as needed (for sleep).     . montelukast (SINGULAIR) 10 MG tablet Take 1 tablet (10 mg total) by mouth at bedtime. 30 tablet 3  . mupirocin ointment (BACTROBAN) 2 % Place 1 application into the nose 2 (two) times daily. 22 g 0  . nystatin (MYCOSTATIN/NYSTOP) powder Apply topically 2 (two) times daily. 45 g 1  . nystatin cream (MYCOSTATIN) Apply 1 application topically 2 (two) times daily. 30 g 2  . omeprazole (PRILOSEC) 40 MG capsule TAKE 1 CAPSULE BY MOUTH  DAILY (Patient taking differently: Take 40 mg by mouth daily. ) 90 capsule 1  . ondansetron (ZOFRAN) 4 MG tablet Take 1 tablet (4 mg total) by mouth every 8 (eight) hours as needed for nausea or vomiting. 30 tablet 0  . potassium chloride 20 MEQ/15ML (  10%) SOLN Take 20 mL by mouth 3 times daily 450 mL 5  . senna (SENOKOT) 8.6 MG TABS tablet Take 1 tablet (8.6 mg total) by mouth 2 (two) times daily. 120 tablet 0  . SYMBICORT 160-4.5 MCG/ACT inhaler INHALE 2 PUFFS BY MOUTH TWO TIMES DAILY 30.6 g 1  . Syringe/Needle, Disp, (SYRINGE 3CC/20GX1") 20G X 1" 3 ML MISC Use monthly as directed 50 each 0  . traMADol (ULTRAM) 50 MG tablet TAKE 1 TABLET(50 MG) BY MOUTH EVERY 12 HOURS AS NEEDED (Patient taking differently: Take 50 mg by mouth every 12 (twelve) hours as needed for moderate pain. ) 15 tablet 0  . Vitamin D, Ergocalciferol, (DRISDOL) 1.25 MG (50000 UT) CAPS capsule TAKE 1 CAPSULE BY MOUTH  EVERY MONDAY, WEDNESDAY,  AND FRIDAY. 39 capsule 1  . zafirlukast (ACCOLATE) 20 MG tablet TAKE 1 TABLET BY MOUTH TWO  TIMES DAILY BEFORE MEALS (Patient taking differently: Take 20 mg by mouth 2 (two) times daily before a meal. ) 180 tablet 1  . diltiazem (CARDIZEM CD) 120 MG 24 hr capsule Take 1 capsule (120 mg total) by mouth daily. 90 capsule 1   No current facility-administered medications on file prior to  visit.     BP 122/78 (BP Location: Right Arm, Patient Position: Sitting, Cuff Size: Large)   Pulse (!) 102   Temp (!) 97.5 F (36.4 C) (Temporal)   Resp 16   LMP 06/11/2012   SpO2 97%    Objective:   Physical Exam Constitutional:      Appearance: She is well-developed.  Neck:     Musculoskeletal: Neck supple.     Thyroid: No thyromegaly.  Cardiovascular:     Rate and Rhythm: Normal rate and regular rhythm.     Heart sounds: Normal heart sounds. No murmur.  Pulmonary:     Effort: Pulmonary effort is normal. No respiratory distress.     Breath sounds: Normal breath sounds. No wheezing.  Musculoskeletal:     Comments: Narrow based gait- unsteady  Skin:    General: Skin is warm and dry.  Neurological:     Mental Status: She is alert and oriented to person, place, and time.  Psychiatric:        Behavior: Behavior normal.        Thought Content: Thought content normal.        Judgment: Judgment normal.   skin: Stage I ulcer noted on left buttock approximately 2-3 inches in diameter, stage II ulcer noted on right buttock approximately 1 inch in diameter in the center of a similarly sized stage I ulcer on the right buttock         Assessment & Plan:  Right shoulder pain-this is most likely related to the fact that she is having a lot of difficulty getting from a seated to a standing position and is putting a lot of weight on her right shoulder.  She is requesting something for pain.  I gave her a prescription for tramadol to use sparingly on a short-term basis.  Skin ulcer- she has DuoDERM's at home.  States she has not used them yet because she is waiting for someone to help replace them.  I have advised her to place a DuoDERM every 3 days and change if soiled or if it falls off.  I have requested home health RN to do wound care however we have not been successful in setting this up for the patient either.  See below discussion re: PT.  Debilitation/gait disorder- she is  definitely at high risk for falls.  She ambulates unsteadily with a Rollator.  She is agreeable to home health physical therapy however we have tried multiple home health agencies and none so far have been willing to take her on as a case.  It seems that it may be due to [place] angiographic reasons as well as insurance.  We are waiting on one final option.  If they are unable to provide home health physical therapy then we may need to have her do outpatient physical therapy and be driven by family members.  This is difficult for her however because she states that her family members have to take a day off from work every time they bring her somewhere.  I have filled paperwork for her employer today.  I feel that it would be reasonable for her to return to work on a work from home basis.  I do not feel that it is safe for her to return to the workplace due to increased risk of falls at this time.  Hopefully for able to get her some additional therapy we can consider this down the road.  Hypokalemia- obtain follow up bmet.

## 2019-02-15 NOTE — Patient Instructions (Signed)
Please complete lab work prior to leaving.   

## 2019-02-16 ENCOUNTER — Telehealth: Payer: Self-pay | Admitting: *Deleted

## 2019-02-16 ENCOUNTER — Telehealth: Payer: Self-pay | Admitting: Family

## 2019-02-16 ENCOUNTER — Encounter: Payer: Self-pay | Admitting: Family

## 2019-02-16 LAB — CBC WITH DIFFERENTIAL/PLATELET
Basophils Absolute: 0.1 10*3/uL (ref 0.0–0.1)
Basophils Relative: 0.9 % (ref 0.0–3.0)
Eosinophils Absolute: 0.1 10*3/uL (ref 0.0–0.7)
Eosinophils Relative: 1.1 % (ref 0.0–5.0)
HCT: 40 % (ref 36.0–46.0)
Hemoglobin: 12.5 g/dL (ref 12.0–15.0)
Lymphocytes Relative: 11.4 % — ABNORMAL LOW (ref 12.0–46.0)
Lymphs Abs: 1.4 10*3/uL (ref 0.7–4.0)
MCHC: 31.2 g/dL (ref 30.0–36.0)
MCV: 79.5 fl (ref 78.0–100.0)
Monocytes Absolute: 0.7 10*3/uL (ref 0.1–1.0)
Monocytes Relative: 5.5 % (ref 3.0–12.0)
Neutro Abs: 10.1 10*3/uL — ABNORMAL HIGH (ref 1.4–7.7)
Neutrophils Relative %: 81.1 % — ABNORMAL HIGH (ref 43.0–77.0)
Platelets: 429 10*3/uL — ABNORMAL HIGH (ref 150.0–400.0)
RBC: 5.03 Mil/uL (ref 3.87–5.11)
RDW: 17.8 % — ABNORMAL HIGH (ref 11.5–15.5)
WBC: 12.5 10*3/uL — ABNORMAL HIGH (ref 4.0–10.5)

## 2019-02-16 LAB — BASIC METABOLIC PANEL
BUN: 30 mg/dL — ABNORMAL HIGH (ref 6–23)
CO2: 34 mEq/L — ABNORMAL HIGH (ref 19–32)
Calcium: 10.8 mg/dL — ABNORMAL HIGH (ref 8.4–10.5)
Chloride: 82 mEq/L — ABNORMAL LOW (ref 96–112)
Creatinine, Ser: 1.01 mg/dL (ref 0.40–1.20)
GFR: 55.58 mL/min — ABNORMAL LOW (ref >60.00)
Glucose, Bld: 111 mg/dL — ABNORMAL HIGH (ref 70–99)
Potassium: 3 mEq/L — ABNORMAL LOW (ref 3.5–5.1)
Sodium: 131 mEq/L — ABNORMAL LOW (ref 135–145)

## 2019-02-16 NOTE — Telephone Encounter (Signed)
Spoke to pt re: potassium. She states that she is taking tid but that she is pouring the medication into an unmeasured cap. Recommended that she obtain a liquid syringe or medicine cup to measure and take TID as directed.  Please call pt to arrange a lab appointment in 1 week.

## 2019-02-16 NOTE — Telephone Encounter (Signed)
Also- please let her know that blood work shows she is not anemic. Her white blood cell count is mildly elevated but has been for >1 month. Please let me know if she develops fever.

## 2019-02-16 NOTE — Telephone Encounter (Signed)
Patient advised of results, also to obtain medication measuring syringe or cup to take 15 ml of her potassium 3 times a day.

## 2019-02-16 NOTE — Telephone Encounter (Signed)
Copied from [place] [phone] [phone]. Topic: General - Other >> Feb 16, 2019  9:44 AM Virl Axe D wrote: Reason for CRM: Lori with [facility] stated they will not be able to start seeing pt. Please advise.

## 2019-02-17 ENCOUNTER — Other Ambulatory Visit: Payer: 59

## 2019-02-17 NOTE — Telephone Encounter (Signed)
This is the message received back today about referral to [facility]:  Doran Stabler, Jolene Provost, CMA        hello Rod Holler  thanks for thinking of [place] but currently we are unable to accept [facility] at this time  ([facility])

## 2019-02-17 NOTE — Telephone Encounter (Signed)
So far services have been denied or refused from [facility], [facility], [facility]), and [facility]. Information faxed to [facility] home health again today, spoke to [facility] Orthoptist) he will review referral today and call us back with determination.

## 2019-02-21 ENCOUNTER — Telehealth: Payer: Self-pay | Admitting: *Deleted

## 2019-02-21 NOTE — Telephone Encounter (Signed)
Copied from [place] [id number]. Topic: General - Other >> Feb 21, 2019  2:49 PM Yvette Rack wrote: Reason for CRM: Carrolyn Leigh with [employer] called for an update on the number of hours patient can work from home. Cb# [phone]

## 2019-02-22 NOTE — Telephone Encounter (Signed)
Patient can work 8 hours a day 5 days a week at home.

## 2019-02-23 NOTE — Telephone Encounter (Signed)
Information given to Nolon Rod, she will set patient up with a computer at home soon.

## 2019-02-28 ENCOUNTER — Encounter: Payer: Self-pay | Admitting: Family

## 2019-03-01 MED ORDER — TRAMADOL HCL 50 MG PO TABS: ORAL_TABLET | ORAL | 0 refills | Status: DC

## 2019-03-03 ENCOUNTER — Other Ambulatory Visit: Payer: Self-pay | Admitting: Family

## 2019-03-11 ENCOUNTER — Encounter: Payer: Self-pay | Admitting: Family

## 2019-03-11 ENCOUNTER — Other Ambulatory Visit: Payer: Self-pay

## 2019-03-11 ENCOUNTER — Ambulatory Visit: Payer: 59 | Admitting: Family

## 2019-03-11 VITALS — BP 129/59 | HR 82 | Temp 96.8°F | Resp 16

## 2019-03-11 DIAGNOSIS — R350 Frequency of micturition: Secondary | ICD-10-CM

## 2019-03-11 DIAGNOSIS — N3000 Acute cystitis without hematuria: Secondary | ICD-10-CM | POA: Diagnosis not present

## 2019-03-11 DIAGNOSIS — L98491 Non-pressure chronic ulcer of skin of other sites limited to breakdown of skin: Secondary | ICD-10-CM | POA: Diagnosis not present

## 2019-03-11 DIAGNOSIS — E876 Hypokalemia: Secondary | ICD-10-CM | POA: Diagnosis not present

## 2019-03-11 DIAGNOSIS — N12 Tubulo-interstitial nephritis, not specified as acute or chronic: Secondary | ICD-10-CM

## 2019-03-11 MED ORDER — SULFAMETHOXAZOLE-TRIMETHOPRIM 800-160 MG PO TABS: 1.0000 | ORAL_TABLET | Freq: Two times a day (BID) | ORAL | 0 refills | Status: DC

## 2019-03-11 MED ORDER — NITROFURANTOIN MACROCRYSTAL 50 MG PO CAPS: 50.0000 mg | ORAL_CAPSULE | Freq: Every day | ORAL | Status: DC

## 2019-03-11 NOTE — Addendum Note (Signed)
Addended by: Caffie Pinto on: 03/11/2019 04:03 PM   Modules accepted: Orders

## 2019-03-11 NOTE — Patient Instructions (Signed)
Please complete lab work prior to leaving. Begin Bactrim for your probable urinary tract infection. Call if symptoms worsen or if symptoms fail to improve.  Purchase a gel pad for your chair. Try to change positions every 90 minutes.  Add a vitamin C supplement once daily and ensure that you are eating a diet high in lean protein to help with wound healing. Have your family members apply the Duoderm to your buttocks every 3 days.

## 2019-03-11 NOTE — Progress Notes (Signed)
Subjective:    Patient ID: [name], female    DOB: 1955-08-29, 63 y.o.   MRN: [id number]  HPI   Patient is a 63 yr old female who presents today for follow up.   Report + pain on her bottom, using tramadol once a night. Spends much of her time in her recliner.  Her employer recently approved for her to work from home.  She plans to begin in the next few days.   Reports dysuria x 10 days. Associated frequency.  Denies unusual low back pain. Denies fever.   Reports mood is stable.    Review of Systems  See HPI  Past Medical History:  Diagnosis Date  . Abnormal Pap smear    years ago/no biopsy  . Allergic rhinitis   . Anemia, iron deficiency    iron infusion scheduled for 04/02/2018  . Aortic atherosclerosis ([place])   . Arthritis    back- lower  . Asthma   . Atrial fibrillation ([place]) August 2012   OFF XARELTO LAST MONTH DUE TO BLEEDING IN STOOL  . Bursitis of hip left  . Chronic diastolic (congestive) heart failure (HCC)    pt unaware of this  . COPD (chronic obstructive pulmonary disease) with chronic bronchitis ([place])   . Decreased pulses in feet   . Dysrhythmia    afib, followed by Dr. Stanford Breed   . Edema of both legs   . Esophagitis 02/02/2014   Distal, linear erosions, noted on endoscopy  . Fatty liver 01/07/2012  . Fibroid 1974   fibroid cyst on left fallopian tube  . Fibromyalgia   . Foot ulcer ([place])    AREA HEALED RIGHT FOOT  . GERD (gastroesophageal reflux disease)   . Headache(784.0)    occasional sinus headache   . Hepatomegaly   . History of blood transfusion JULY 2015  . History of cardiomegaly 12/06/2010   Noted on CT  . History of E. coli septicemia   . Hypertension   . Hypoparathyroidism ([place]) 01/07/2011  . Hypothyroidism   . Kidney stone 10-16-15   passed on their own  . Morbid obesity ([place])   . MRSA infection   . Nephrolithiasis 2/16, 9/16   SEES DR Risa Grill  . Pernicious anemia 02/20/2014   followed by Debbrah Alar  . Pneumonia    . PONV (postoperative nausea and vomiting)   . Pyelonephritis 11/24/2018  . Rash    thighs and back  . Seizures ([place])    infancy secondary to fever  . Staph aureus infection   . Thoracic spondylosis 12/06/2010   Noted on CT  . Thyroid cancer ([place]) 2011   THYROIDECTOMY DONE  . Uterine cancer ([place]) 2014   Mirena IUD  . Vitamin B 12 deficiency   . Vitamin D deficiency      Social History   Socioeconomic History  . Marital status: Single    Spouse name: Not on file  . Number of children: 0  . Years of education: Not on file  . Highest education level: Not on file  Occupational History  . Occupation: works in Insurance claims handler  . Occupation: DESIGN COMPUTER CHIP    Employer: [employer]  Social Needs  . Financial resource strain: Not on file  . Food insecurity    Worry: Not on file    Inability: Not on file  . Transportation needs    Medical: Not on file    Non-medical: Not on file  Tobacco Use  . Smoking status:  Former Smoker    Packs/day: 0.50    Years: 25.00    Pack years: 12.50    Types: Cigarettes    Start date: 12/24/1970    Quit date: 05/27/1995    Years since quitting: 23.8  . Smokeless tobacco: Never Used  . Tobacco comment: quit smoking 19 years ago  Substance and Sexual Activity  . Alcohol use: Yes    Alcohol/week: 0.0 standard drinks    Comment: 1/2 glass per month  . Drug use: No  . Sexual activity: Yes    Partners: Male    Birth control/protection: Post-menopausal  Lifestyle  . Physical activity    Days per week: Not on file    Minutes per session: Not on file  . Stress: Not on file  Relationships  . Social Herbalist on phone: Not on file    Gets together: Not on file    Attends religious service: Not on file    Active member of club or organization: Not on file    Attends meetings of clubs or organizations: Not on file    Relationship status: Not on file  . Intimate partner violence    Fear of current or ex partner: Not on  file    Emotionally abused: Not on file    Physically abused: Not on file    Forced sexual activity: Not on file  Other Topics Concern  . Not on file  Social History Narrative   Occupation: works in Insurance claims handler - Field seismologist   Single       Former Smoker - quit tobacco 12 years ago.  She was light smoker for 10 years.                 Past Surgical History:  Procedure Laterality Date  . AMPUTATION Right 05/18/2014   Procedure: RIGHT FIFTH RAY AMPUTATION FOOT;  Surgeon: Wylene Simmer, MD;  Location: [facility];  Service: Orthopedics;  Laterality: Right;  . APPENDECTOMY    . BUNIONECTOMY     bilateral  . COLONOSCOPY WITH PROPOFOL N/A 02/02/2014   Procedure: COLONOSCOPY WITH PROPOFOL;  Surgeon: Irene Shipper, MD;  [facility];  Service: Endoscopy;  Laterality: N/A;  . cyst on ovary removed     . CYSTOSCOPY W/ RETROGRADES  03/03/2012   Procedure: CYSTOSCOPY WITH RETROGRADE PYELOGRAM;  Surgeon: Bernestine Amass, MD;  [facility]: [facility];  Service: Urology;  Laterality: Bilateral;  . CYSTOSCOPY W/ URETERAL STENT PLACEMENT Right 11/25/2017   Procedure: CYSTOSCOPY WITH RETROGRADE RIGHT URETERAL STENT PLACEMENT;  Surgeon: Franchot Gallo, MD;  Location: [facility];  Service: Urology;  Laterality: Right;  . CYSTOSCOPY W/ URETERAL STENT PLACEMENT Right 01/15/2018   Procedure: RIGHT STENT EXCHANGE;  Surgeon: Ardis Hughs, MD;  [facility];  Service: Urology;  Laterality: Right;  . CYSTOSCOPY WITH RETROGRADE PYELOGRAM, URETEROSCOPY AND STENT PLACEMENT Left 08/25/2012   Procedure: CYSTOSCOPY WITH RETROGRADE PYELOGRAM, URETEROSCOPY;  Surgeon: Bernestine Amass, MD;  [facility];  Service: Urology;  Laterality: Left;  . CYSTOSCOPY WITH STENT PLACEMENT Left 01/15/2018   Procedure: LEFT STENT PLACEMENT;  Surgeon: Ardis Hughs, MD;  [facility];  Service: Urology;  Laterality: Left;  . CYSTOSCOPY WITH STENT PLACEMENT Right 01/22/2018   Procedure: RIGHT STENT REMOVAL;   Surgeon: Ardis Hughs, MD;  [facility];  Service: Urology;  Laterality: Right;  . CYSTOSCOPY/URETEROSCOPY/HOLMIUM LASER/STENT PLACEMENT Right 12/23/2017   Procedure: RIGHT URETEROSCOPY STONE REMOVAL, HOLMIUM LASER, RIGHT /  STENT EXCHANGE;  Surgeon: Ardis Hughs, MD;  [facility];  Service: Urology;  Laterality: Right;  . CYSTOSCOPY/URETEROSCOPY/HOLMIUM LASER/STENT PLACEMENT Left 01/22/2018   Procedure: LEFT URETEROSCOPY STONE REMOVAL HOLMIUM LASER LEFT STENT Freddi Starr;  Surgeon: Ardis Hughs, MD;  [facility];  Service: Urology;  Laterality: Left;  . DILATION AND CURETTAGE OF UTERUS N/A 02/21/2013   Procedure: DILATATION AND CURETTAGE;  Surgeon: Lyman Speller, MD;  Location: [facility];  Service: Gynecology;  Laterality: N/A;  . DILATION AND CURETTAGE OF UTERUS N/A 04/09/2015   Procedure: [place] IUD removal;  Surgeon: Megan Salon, MD;  Location: [facility];  Service: Gynecology;  Laterality: N/A;  Patient weight 307lbs  . ESOPHAGOGASTRODUODENOSCOPY (EGD) WITH PROPOFOL N/A 02/02/2014   Procedure: ESOPHAGOGASTRODUODENOSCOPY (EGD) WITH PROPOFOL;  Surgeon: Irene Shipper, MD;  [facility];  Service: Endoscopy;  Laterality: N/A;  . GASTRIC BYPASS  1974  . HOLMIUM LASER APPLICATION Left 0000000   Procedure: HOLMIUM LASER APPLICATION;  Surgeon: Bernestine Amass, MD;  [facility];  Service: Urology;  Laterality: Left;  . LITHOTRIPSY  03/2012  . LITHOTRIPSY  2/16  . THYROIDECTOMY  05/15/2010  . TONSILLECTOMY AND ADENOIDECTOMY    . URETEROSCOPY  03/03/2012   Procedure: URETEROSCOPY;  Surgeon: Bernestine Amass, MD;  [facility];  Service: Urology;  Laterality: Left;  . URETEROSCOPY WITH HOLMIUM LASER LITHOTRIPSY Bilateral 01/15/2018   Procedure: CYSTOSCOPY/BILATERAL URETEROSCOPY WITH HOLMIUM LASER LITHOTRIPSY STONE REMOVAL;  Surgeon: Ardis Hughs, MD;  [facility];  Service: Urology;  Laterality: Bilateral;     Family History  Problem Relation Age of Onset  . Hypertension Father   . Diabetes Father   . Lung cancer Father   . Heart attack Father        MI at age 81  . Hypertension Mother   . Hyperthyroidism Mother   . Heart disease Mother   . Heart attack Mother        MI at age 6  . Asthma Brother   . Hypertension Brother        younger  . Heart disease Brother        older  . Colon cancer Neg Hx   . Esophageal cancer Neg Hx   . Stomach cancer Neg Hx   . Kidney disease Neg Hx   . Liver disease Neg Hx     Allergies  Allergen Reactions  . Other Anaphylaxis    Reaction to tree nuts  . Amoxicillin-Pot Clavulanate Other (See Comments)    headache  . Ciprofloxacin Nausea Only and Rash  . Food Hives and Other (See Comments)    Potato  . Keflex [Cephalexin] Rash  . Penicillins Rash    Headache. And hives - Dose not tolerate Cephalosporin either   Has patient had a PCN reaction causing immediate rash, facial/tongue/throat swelling, SOB or lightheadedness with hypotension: No Has patient had a PCN reaction causing severe rash involving mucus membranes or skin necrosis: No Has patient had a PCN reaction that required hospitalization: No Has patient had a PCN reaction occurring within the last 10 years: Yes (reaction was May 2020) If all of the above answers are "NO", then may proce  . Rocephin [Ceftriaxone] Rash  . Tomato Hives    Current Outpatient Medications on File Prior to Visit  Medication Sig Dispense Refill  . acetaminophen (TYLENOL) 325 MG tablet Take 2 tablets (650 mg total) by mouth every 6 (six) hours as needed  for mild pain or moderate pain. 60 tablet 0  . albuterol (PROVENTIL HFA;VENTOLIN HFA) 108 (90 Base) MCG/ACT inhaler Inhale 2 puffs into the lungs every 6 (six) hours as needed for wheezing or shortness of breath. 1 Inhaler 5  . albuterol (PROVENTIL) (2.5 MG/3ML) 0.083% nebulizer solution Take 3 mLs (2.5 mg total) by nebulization every 6 (six) hours as needed  for wheezing or shortness of breath. 150 mL 1  . calcitRIOL (ROCALTROL) 0.5 MCG capsule Take 2 mcg by mouth daily.     . calcium elemental as carbonate (TUMS ULTRA 1000) 400 MG chewable tablet Chew 2,000 mg by mouth 2 (two) times daily.    . cyanocobalamin (,VITAMIN B-12,) 1000 MCG/ML injection Inject 1 mL (1,000 mcg total) into the skin every 30 (thirty) days. 3 mL 4  . desoximetasone (TOPICORT) 0.05 % cream Apply topically 2 (two) times daily as needed. 60 g 0  . diclofenac sodium (VOLTAREN) 1 % GEL Apply 2 g topically 4 (four) times daily. 100 g 3  . diphenhydrAMINE (BENADRYL) 25 mg capsule Take 25 mg by mouth every 6 (six) hours as needed for itching.     [name] ELIQUIS 5 MG TABS tablet TAKE 1 TABLET BY MOUTH TWO  TIMES DAILY (Patient taking differently: Take 5 mg by mouth 2 (two) times daily. ) 180 tablet 1  . EPINEPHrine (EPIPEN 2-PAK) 0.3 mg/0.3 mL IJ SOAJ injection Inject 0.3 mg into the muscle once as needed for anaphylaxis (severe allergic reaction).     . furosemide (LASIX) 40 MG tablet TAKE 2 TABLETS BY MOUTH  DAILY IN THE MORNING AND 1  TABLET IN THE EVENING 270 tablet 3  . hydrocortisone cream 1 % Apply topically 4 (four) times daily as needed for itching. 30 g 0  . levocetirizine (XYZAL) 5 MG tablet TAKE 1 TABLET BY MOUTH  EVERY EVENING 90 tablet 3  . levothyroxine (SYNTHROID, LEVOTHROID) 100 MCG tablet Take 400 mcg by mouth daily before breakfast.     . Melatonin 5 MG TABS Take 5 mg by mouth at bedtime as needed (for sleep).     . montelukast (SINGULAIR) 10 MG tablet Take 1 tablet (10 mg total) by mouth at bedtime. 30 tablet 3  . mupirocin ointment (BACTROBAN) 2 % Place 1 application into the nose 2 (two) times daily. 22 g 0  . nystatin (MYCOSTATIN/NYSTOP) powder Apply topically 2 (two) times daily. 45 g 1  . nystatin cream (MYCOSTATIN) Apply 1 application topically 2 (two) times daily. 30 g 2  . omeprazole (PRILOSEC) 40 MG capsule Take 1 capsule (40 mg total) by mouth daily. 90 capsule  0  . ondansetron (ZOFRAN) 4 MG tablet Take 1 tablet (4 mg total) by mouth every 8 (eight) hours as needed for nausea or vomiting. 30 tablet 0  . potassium chloride 20 MEQ/15ML (10%) SOLN Take 20 mL by mouth 3 times daily 450 mL 5  . senna (SENOKOT) 8.6 MG TABS tablet Take 1 tablet (8.6 mg total) by mouth 2 (two) times daily. 120 tablet 0  . SYMBICORT 160-4.5 MCG/ACT inhaler INHALE 2 PUFFS BY MOUTH TWO TIMES DAILY 30.6 g 1  . Syringe/Needle, Disp, (SYRINGE 3CC/20GX1") 20G X 1" 3 ML MISC Use monthly as directed 50 each 0  . traMADol (ULTRAM) 50 MG tablet TAKE 1 TABLET(50 MG) BY MOUTH EVERY 12 HOURS AS NEEDED 15 tablet 0  . Vitamin D, Ergocalciferol, (DRISDOL) 1.25 MG (50000 UT) CAPS capsule TAKE 1 CAPSULE BY MOUTH  EVERY MONDAY, WEDNESDAY,  AND FRIDAY. 39 capsule 1  . zafirlukast (ACCOLATE) 20 MG tablet TAKE 1 TABLET BY MOUTH  TWICE DAILY BEFORE MEALS 180 tablet 3  . diltiazem (CARDIZEM CD) 120 MG 24 hr capsule Take 1 capsule (120 mg total) by mouth daily. 90 capsule 1   No current facility-administered medications on file prior to visit.     BP (!) 129/59 (BP Location: Right Arm, Patient Position: Sitting, Cuff Size: Large)   Pulse 82   Temp (!) 96.8 F (36 C) (Temporal)   Resp 16   LMP 06/11/2012   SpO2 99%       Objective:   Physical Exam Constitutional:      Appearance: She is well-developed.  Neck:     Musculoskeletal: Neck supple.     Thyroid: No thyromegaly.  Cardiovascular:     Rate and Rhythm: Normal rate and regular rhythm.     Heart sounds: Normal heart sounds. No murmur.  Pulmonary:     Effort: Pulmonary effort is normal. No respiratory distress.     Breath sounds: Normal breath sounds. No wheezing.  Skin:    General: Skin is warm and dry.          Comments: Stage 2 skin ulcers on both buttocks with surrounding   Neurological:     Mental Status: She is alert and oriented to person, place, and time.  Psychiatric:        Behavior: Behavior normal.        Thought  Content: Thought content normal.        Judgment: Judgment normal.           Assessment & Plan:  Skin Ulcer- we have not be able to get any local home health agencies to come to her home due to covid-19.  I advised her to apply duoderm q3 days to affected area, (she can have family members help her apply).  Advised her to purchase a gel pad overlay from [facility] and to change position every 90 minutes.  Also advised that she add a vitamin C supplement and focus on a diet high in lean protein to help with wound healing.  Continue tramadol prn pain.   UTI- pt unable to provide a urine sample. Will rx with empiric bactrim. Pt understands that if symptoms fail to improve she will need to return to the lab to provide a specimen.   Hypokalemia- check follow up bmet.

## 2019-03-12 LAB — CBC WITH DIFFERENTIAL/PLATELET
Absolute Monocytes: 516 cells/uL (ref 200–950)
Basophils Absolute: 65 cells/uL (ref 0–200)
Basophils Relative: 0.5 %
Eosinophils Absolute: 129 cells/uL (ref 15–500)
Eosinophils Relative: 1 %
HCT: 39.8 % (ref 35.0–45.0)
Hemoglobin: 12.4 g/dL (ref 11.7–15.5)
Lymphs Abs: 1213 cells/uL (ref 850–3900)
MCH: 24.2 pg — ABNORMAL LOW (ref 27.0–33.0)
MCHC: 31.2 g/dL — ABNORMAL LOW (ref 32.0–36.0)
MCV: 77.7 fL — ABNORMAL LOW (ref 80.0–100.0)
MPV: 11.1 fL (ref 7.5–12.5)
Monocytes Relative: 4 %
Neutro Abs: 10978 cells/uL — ABNORMAL HIGH (ref 1500–7800)
Neutrophils Relative %: 85.1 %
Platelets: 306 10*3/uL (ref 140–400)
RBC: 5.12 10*6/uL — ABNORMAL HIGH (ref 3.80–5.10)
RDW: 16.1 % — ABNORMAL HIGH (ref 11.0–15.0)
Total Lymphocyte: 9.4 %
WBC: 12.9 10*3/uL — ABNORMAL HIGH (ref 3.8–10.8)

## 2019-03-12 LAB — BASIC METABOLIC PANEL
BUN/Creatinine Ratio: 24 (calc) — ABNORMAL HIGH (ref 6–22)
BUN: 24 mg/dL (ref 7–25)
CO2: 29 mmol/L (ref 20–32)
Calcium: 10 mg/dL (ref 8.6–10.4)
Chloride: 86 mmol/L — ABNORMAL LOW (ref 98–110)
Creat: 1.02 mg/dL — ABNORMAL HIGH (ref 0.50–0.99)
Glucose, Bld: 98 mg/dL (ref 65–99)
Potassium: 3.1 mmol/L — ABNORMAL LOW (ref 3.5–5.3)
Sodium: 132 mmol/L — ABNORMAL LOW (ref 135–146)

## 2019-03-14 ENCOUNTER — Telehealth: Payer: Self-pay | Admitting: Family

## 2019-03-14 ENCOUNTER — Other Ambulatory Visit: Payer: Self-pay

## 2019-03-14 DIAGNOSIS — D72828 Other elevated white blood cell count: Secondary | ICD-10-CM

## 2019-03-14 DIAGNOSIS — E876 Hypokalemia: Secondary | ICD-10-CM

## 2019-03-14 NOTE — Telephone Encounter (Signed)
Also, I checked and her insurance does not cover duoderm.  She can purchase duoderm OTC at her local pharmacy or order online from [facility].

## 2019-03-14 NOTE — Telephone Encounter (Signed)
Please contact pt and let her know that her potassium is still low at 3.1.  Due to her persistently low levels, I would like her to stop HCTZ.  Continue current TID dose of potassium.   White blood cell count was elevated, likely due to UTI.  Please let me know if UTI symptoms do not improve on the bactrim.   I would like to schedule her a nurse visit in 1 week when I am in the office for bp recheck CBC (dx leukocytosis) and BMET (dx hypokalemia).

## 2019-03-14 NOTE — Telephone Encounter (Signed)
Patient advised of results and provider's advise. She was scheduled to be here 03-22-2019 for labs and bp check.   Had to leave a message at [facility] for nuse to call back about referral.  Patient advise on how to get duoderm.

## 2019-03-14 NOTE — Telephone Encounter (Signed)
Also, can you please try calling [facility] to see about arranging PT/OT and RN for wound care?  Phone: [phone]

## 2019-03-21 ENCOUNTER — Encounter: Payer: Self-pay | Admitting: Family

## 2019-03-22 ENCOUNTER — Ambulatory Visit: Payer: 59

## 2019-03-22 ENCOUNTER — Other Ambulatory Visit: Payer: 59

## 2019-03-22 MED ORDER — TRAMADOL HCL 50 MG PO TABS: ORAL_TABLET | ORAL | 0 refills | Status: DC

## 2019-04-05 NOTE — Progress Notes (Addendum)
[place] [facility] [address], [place], [place] [address] [phone]  Date:  04/06/2019   Name:  [name]   DOB:  1956-04-03   MRN:  [id number]  PCP:  Debbrah Alar, NP    Chief Complaint: Urinary Tract Infection   History of Present Illness:  [name] is a 63 y.o. very pleasant female patient who presents with the following:  Patient of Debbrah Alar, with history of multiple episodes of kidney stones, bariatric surgery and resultant iron deficiency anemia, A. fib, COPD, hyperparathyroidism status post operative treatment I have not seen this patient previously She was admitted for 3 days in July with an episode of pyelonephritis-she was treated with IV Rocephin, transition to p.o. Bactrim  She saw Lenna Sciara most recently about 1 month ago to discuss a skin ulcer- she continues to have this sacral decub which has been present since she was hospitalized in July She is working from home, she is seated at her computer -since she began working again and is sitting more her ulcer may have worsened She never got PT or home health nursing to come in-  It is not getting larger but not going away either  It seemed to be "dripping blood" recently so she thought it should be looked at They are not using any bandage right now Flu shot is up-to-date  She was having burning with urination, but this cleared up-however she would like to do a urine culture to make sure there is no infection Her urine culture in July showed pansensitive E. Coli No blood in her urine   She has history of chronic lower extremity edema Most recent echocardiogram in June of this year showed slightly decreased systolic function, slightly abnormal diastolic function She is taking lasix 40 am and 40 pm She is also taking hctz 25 mg She is taking diltiazem as well   She denies any pre-syncope sx, states that quite low blood pressure is  normal for her  Wt Readings from Last 3 Encounters:  04/06/19 241 lb 12.8 oz (109.7 kg)  12/22/18 262 lb 6.4 oz (119 kg)  12/16/18 254 lb (115.2 kg)   She has noted some "light green spots" floating around in her eyes for a couple of weeks No eye pain except for her dry eyes  No flashing lights or curtain dropping  They feel like she is more confused and disoriented since this past July- they are not sure if she might have had a stroke when she was in the hospital.  She is interested in seeing a neurologist.  No acute change or worsening of the symptom  She is taking 20 meq of K three times a day to normalize her potassium  She is accompanied today by her brother who assists her and contributes to the history  She is not currently driving  Patient Active Problem List   Diagnosis Date Noted  . Chronic anticoagulation 12/16/2018  . Abnormal nuclear stress test 12/16/2018  . Debilitated patient 12/16/2018  . Pressure injury of skin 12/13/2018  . UTI (urinary tract infection) 12/13/2018  . Pyelonephritis 11/21/2018  . Sepsis due to Escherichia coli (E. coli) ([place]) 11/28/2017  . E coli bacteremia 11/28/2017  . Sepsis ([place]) 11/24/2017  . Hypokalemia 12/10/2016  . Hypocalcemia 12/10/2016  . Recurrent falls 12/09/2016  . Right ureteral stone 07/17/2014  . Osteomyelitis ([place]) 06/14/2014  . Pernicious anemia 02/20/2014  . Reflux esophagitis  02/02/2014  . Edema 11/23/2013  . Foot pain 03/31/2013  . Endometrial adenocarcinoma ([place]) 02/21/2013  . Trochanteric bursitis of left hip 09/13/2012  . Muscle cramps 08/12/2012  . Fatty liver 01/07/2012  . Mid back pain 09/30/2011  . Weight gain 07/02/2011  . Nonspecific abnormal unspecified cardiovascular function study 04/30/2011  . Hx of papillary thyroid carcinoma 04/07/2011  . Low back pain 04/01/2011  . PAF (paroxysmal atrial fibrillation) ([place]) 02/05/2011  . Hypoparathyroidism ([place]) 01/07/2011  . GERD 05/22/2009  . Leukocytosis  03/24/2009  . Morbid obesity ([place]) 12/20/2008  . RHINOSINUSITIS, RECURRENT 09/07/2008  . Vitamin D deficiency 05/10/2007  . Iron deficiency anemia secondary to malabsorption  05/10/2007  . Hypothyroidism 01/05/2007  . Essential hypertension 01/05/2007  . ALLERGIC RHINITIS 01/05/2007  . Asthma 01/05/2007    Past Medical History:  Diagnosis Date  . Abnormal Pap smear    years ago/no biopsy  . Allergic rhinitis   . Anemia, iron deficiency    iron infusion scheduled for 04/02/2018  . Aortic atherosclerosis ([place])   . Arthritis    back- lower  . Asthma   . Atrial fibrillation ([place]) August 2012   OFF XARELTO LAST MONTH DUE TO BLEEDING IN STOOL  . Bursitis of hip left  . Chronic diastolic (congestive) heart failure (H[facility])    pt unaware of this  . COPD (chronic obstructive pulmonary disease) with chronic bronchitis ([place])   . Decreased pulses in feet   . Dysrhythmia    afib, followed by Dr. Stanford Breed   . Edema of both legs   . Esophagitis 02/02/2014   Distal, linear erosions, noted on endoscopy  . Fatty liver 01/07/2012  . Fibroid 1974   fibroid cyst on left fallopian tube  . Fibromyalgia   . Foot ulcer ([place])    AREA HEALED RIGHT FOOT  . GERD (gastroesophageal reflux disease)   . Headache(784.0)    occasional sinus headache   . Hepatomegaly   . History of blood transfusion JULY 2015  . History of cardiomegaly 12/06/2010   Noted on CT  . History of E. coli septicemia   . Hypertension   . Hypoparathyroidism ([place]) 01/07/2011  . Hypothyroidism   . Kidney stone 10/13/15   passed on their own  . Morbid obesity ([place])   . MRSA infection   . Nephrolithiasis 2/16, 9/16   SEES DR Risa Grill  . Pernicious anemia 02/20/2014   followed by Debbrah Alar  . Pneumonia   . PONV (postoperative nausea and vomiting)   . Pyelonephritis 11/24/2018  . Rash    thighs and back  . Seizures ([place])    infancy secondary to fever  . Staph aureus infection   . Thoracic spondylosis 12/06/2010    Noted on CT  . Thyroid cancer ([place]) 2011   THYROIDECTOMY DONE  . Uterine cancer ([place]) 2014   Mirena IUD  . Vitamin B 12 deficiency   . Vitamin D deficiency     Past Surgical History:  Procedure Laterality Date  . AMPUTATION Right 05/18/2014   Procedure: RIGHT FIFTH RAY AMPUTATION FOOT;  Surgeon: Wylene Simmer, MD;  Location: [facility];  Service: Orthopedics;  Laterality: Right;  . APPENDECTOMY    . BUNIONECTOMY     bilateral  . COLONOSCOPY WITH PROPOFOL N/A 02/02/2014   Procedure: COLONOSCOPY WITH PROPOFOL;  Surgeon: Irene Shipper, MD;  Location: [facility];  Service: Endoscopy;  Laterality: N/A;  . cyst on ovary removed     . CYSTOSCOPY W/ RETROGRADES  03/03/2012  Procedure: CYSTOSCOPY WITH RETROGRADE PYELOGRAM;  Surgeon: Bernestine Amass, MD;  Location: [facility];  Service: Urology;  Laterality: Bilateral;  . CYSTOSCOPY W/ URETERAL STENT PLACEMENT Right 11/25/2017   Procedure: CYSTOSCOPY WITH RETROGRADE RIGHT URETERAL STENT PLACEMENT;  Surgeon: Franchot Gallo, MD;  Location: [facility];  Service: Urology;  Laterality: Right;  . CYSTOSCOPY W/ URETERAL STENT PLACEMENT Right 01/15/2018   Procedure: RIGHT STENT EXCHANGE;  Surgeon: Ardis Hughs, MD;  [facility];  Service: Urology;  Laterality: Right;  . CYSTOSCOPY WITH RETROGRADE PYELOGRAM, URETEROSCOPY AND STENT PLACEMENT Left 08/25/2012   Procedure: CYSTOSCOPY WITH RETROGRADE PYELOGRAM, URETEROSCOPY;  Surgeon: Bernestine Amass, MD;  [facility];  Service: Urology;  Laterality: Left;  . CYSTOSCOPY WITH STENT PLACEMENT Left 01/15/2018   Procedure: LEFT STENT PLACEMENT;  Surgeon: Ardis Hughs, MD;  [facility];  Service: Urology;  Laterality: Left;  . CYSTOSCOPY WITH STENT PLACEMENT Right 01/22/2018   Procedure: RIGHT STENT REMOVAL;  Surgeon: Ardis Hughs, MD;  [facility];  Service: Urology;  Laterality: Right;  . CYSTOSCOPY/URETEROSCOPY/HOLMIUM LASER/STENT PLACEMENT Right 12/23/2017   Procedure: RIGHT URETEROSCOPY  STONE REMOVAL, HOLMIUM LASER, RIGHT /STENT EXCHANGE;  Surgeon: Ardis Hughs, MD;  [facility];  Service: Urology;  Laterality: Right;  . CYSTOSCOPY/URETEROSCOPY/HOLMIUM LASER/STENT PLACEMENT Left 01/22/2018   Procedure: LEFT URETEROSCOPY STONE REMOVAL HOLMIUM LASER LEFT STENT Freddi Starr;  Surgeon: Ardis Hughs, MD;  [facility];  Service: Urology;  Laterality: Left;  . DILATION AND CURETTAGE OF UTERUS N/A 02/21/2013   Procedure: DILATATION AND CURETTAGE;  Surgeon: Lyman Speller, MD;  Location: [facility];  Service: Gynecology;  Laterality: N/A;  . DILATION AND CURETTAGE OF UTERUS N/A 04/09/2015   Procedure: [place] IUD removal;  Surgeon: Megan Salon, MD;  Location: [facility];  Service: Gynecology;  Laterality: N/A;  Patient weight 307lbs  . ESOPHAGOGASTRODUODENOSCOPY (EGD) WITH PROPOFOL N/A 02/02/2014   Procedure: ESOPHAGOGASTRODUODENOSCOPY (EGD) WITH PROPOFOL;  Surgeon: Irene Shipper, MD;  [facility];  Service: Endoscopy;  Laterality: N/A;  . GASTRIC BYPASS  1974  . HOLMIUM LASER APPLICATION Left 0000000   Procedure: HOLMIUM LASER APPLICATION;  Surgeon: Bernestine Amass, MD;  [facility];  Service: Urology;  Laterality: Left;  . LITHOTRIPSY  03/2012  . LITHOTRIPSY  2/16  . THYROIDECTOMY  05/15/2010  . TONSILLECTOMY AND ADENOIDECTOMY    . URETEROSCOPY  03/03/2012   Procedure: URETEROSCOPY;  Surgeon: Bernestine Amass, MD;  [facility];  Service: Urology;  Laterality: Left;  . URETEROSCOPY WITH HOLMIUM LASER LITHOTRIPSY Bilateral 01/15/2018   Procedure: CYSTOSCOPY/BILATERAL URETEROSCOPY WITH HOLMIUM LASER LITHOTRIPSY STONE REMOVAL;  Surgeon: Ardis Hughs, MD;  [facility];  Service: Urology;  Laterality: Bilateral;    Social History   Tobacco Use  . Smoking status: Former Smoker    Packs/day: 0.50    Years: 25.00    Pack years: 12.50    Types: Cigarettes    Start date: 12/24/1970    Quit date:  05/27/1995    Years since quitting: 23.8  . Smokeless tobacco: Never Used  . Tobacco comment: quit smoking 19 years ago  Substance Use Topics  . Alcohol use: Yes    Alcohol/week: 0.0 standard drinks    Comment: 1/2 glass per month  . Drug use: No    Family History  Problem Relation Age of Onset  . Hypertension Father   . Diabetes Father   . Lung cancer Father   .  Heart attack Father        MI at age 65  . Hypertension Mother   . Hyperthyroidism Mother   . Heart disease Mother   . Heart attack Mother        MI at age 28  . Asthma Brother   . Hypertension Brother        younger  . Heart disease Brother        older  . Colon cancer Neg Hx   . Esophageal cancer Neg Hx   . Stomach cancer Neg Hx   . Kidney disease Neg Hx   . Liver disease Neg Hx     Allergies  Allergen Reactions  . Other Anaphylaxis    Reaction to tree nuts  . Amoxicillin-Pot Clavulanate Other (See Comments)    headache  . Ciprofloxacin Nausea Only and Rash  . Food Hives and Other (See Comments)    Potato  . Keflex [Cephalexin] Rash  . Penicillins Rash    Headache. And hives - Dose not tolerate Cephalosporin either   Has patient had a PCN reaction causing immediate rash, facial/tongue/throat swelling, SOB or lightheadedness with hypotension: No Has patient had a PCN reaction causing severe rash involving mucus membranes or skin necrosis: No Has patient had a PCN reaction that required hospitalization: No Has patient had a PCN reaction occurring within the last 10 years: Yes (reaction was May 2020) If all of the above answers are "NO", then may proce  . Rocephin [Ceftriaxone] Rash  . Tomato Hives    Medication list has been reviewed and updated.  Current Outpatient Medications on File Prior to Visit  Medication Sig Dispense Refill  . acetaminophen (TYLENOL) 325 MG tablet Take 2 tablets (650 mg total) by mouth every 6 (six) hours as needed for mild pain or moderate pain. 60 tablet 0  . albuterol  (PROVENTIL HFA;VENTOLIN HFA) 108 (90 Base) MCG/ACT inhaler Inhale 2 puffs into the lungs every 6 (six) hours as needed for wheezing or shortness of breath. 1 Inhaler 5  . albuterol (PROVENTIL) (2.5 MG/3ML) 0.083% nebulizer solution Take 3 mLs (2.5 mg total) by nebulization every 6 (six) hours as needed for wheezing or shortness of breath. 150 mL 1  . calcitRIOL (ROCALTROL) 0.5 MCG capsule Take 2 mcg by mouth daily.     . calcium elemental as carbonate (TUMS ULTRA 1000) 400 MG chewable tablet Chew 2,000 mg by mouth 2 (two) times daily.    . cyanocobalamin (,VITAMIN B-12,) 1000 MCG/ML injection Inject 1 mL (1,000 mcg total) into the skin every 30 (thirty) days. 3 mL 4  . desoximetasone (TOPICORT) 0.05 % cream Apply topically 2 (two) times daily as needed. 60 g 0  . diclofenac sodium (VOLTAREN) 1 % GEL Apply 2 g topically 4 (four) times daily. 100 g 3  . diphenhydrAMINE (BENADRYL) 25 mg capsule Take 25 mg by mouth every 6 (six) hours as needed for itching.     [name] ELIQUIS 5 MG TABS tablet TAKE 1 TABLET BY MOUTH TWO  TIMES DAILY (Patient taking differently: Take 5 mg by mouth 2 (two) times daily. ) 180 tablet 1  . EPINEPHrine (EPIPEN 2-PAK) 0.3 mg/0.3 mL IJ SOAJ injection Inject 0.3 mg into the muscle once as needed for anaphylaxis (severe allergic reaction).     . furosemide (LASIX) 40 MG tablet TAKE 2 TABLETS BY MOUTH  DAILY IN THE MORNING AND 1  TABLET IN THE EVENING 270 tablet 3  . hydrocortisone cream 1 % Apply topically  4 (four) times daily as needed for itching. 30 g 0  . levocetirizine (XYZAL) 5 MG tablet TAKE 1 TABLET BY MOUTH  EVERY EVENING 90 tablet 3  . levothyroxine (SYNTHROID, LEVOTHROID) 100 MCG tablet Take 400 mcg by mouth daily before breakfast.     . Melatonin 5 MG TABS Take 5 mg by mouth at bedtime as needed (for sleep).     . montelukast (SINGULAIR) 10 MG tablet Take 1 tablet (10 mg total) by mouth at bedtime. 30 tablet 3  . mupirocin ointment (BACTROBAN) 2 % Place 1 application into  the nose 2 (two) times daily. 22 g 0  . nitrofurantoin (MACRODANTIN) 50 MG capsule Take 1 capsule (50 mg total) by mouth at bedtime.    [name] nystatin (MYCOSTATIN/NYSTOP) powder Apply topically 2 (two) times daily. 45 g 1  . nystatin cream (MYCOSTATIN) Apply 1 application topically 2 (two) times daily. 30 g 2  . omeprazole (PRILOSEC) 40 MG capsule Take 1 capsule (40 mg total) by mouth daily. 90 capsule 0  . ondansetron (ZOFRAN) 4 MG tablet Take 1 tablet (4 mg total) by mouth every 8 (eight) hours as needed for nausea or vomiting. 30 tablet 0  . potassium chloride 20 MEQ/15ML (10%) SOLN Take 20 mL by mouth 3 times daily 450 mL 5  . senna (SENOKOT) 8.6 MG TABS tablet Take 1 tablet (8.6 mg total) by mouth 2 (two) times daily. 120 tablet 0  . SYMBICORT 160-4.5 MCG/ACT inhaler INHALE 2 PUFFS BY MOUTH TWO TIMES DAILY 30.6 g 1  . Syringe/Needle, Disp, (SYRINGE 3CC/20GX1") 20G X 1" 3 ML MISC Use monthly as directed 50 each 0  . traMADol (ULTRAM) 50 MG tablet TAKE 1 TABLET(50 MG) BY MOUTH EVERY 12 HOURS AS NEEDED 30 tablet 0  . Vitamin D, Ergocalciferol, (DRISDOL) 1.25 MG (50000 UT) CAPS capsule TAKE 1 CAPSULE BY MOUTH  EVERY MONDAY, WEDNESDAY,  AND FRIDAY. 39 capsule 1  . zafirlukast (ACCOLATE) 20 MG tablet TAKE 1 TABLET BY MOUTH  TWICE DAILY BEFORE MEALS 180 tablet 3  . diltiazem (CARDIZEM CD) 120 MG 24 hr capsule Take 1 capsule (120 mg total) by mouth daily. 90 capsule 1   No current facility-administered medications on file prior to visit.     Review of Systems:  As per HPI- otherwise negative. No fever or chills  Physical Examination: Vitals:   04/06/19 1304 04/06/19 1355  BP: (!) 105/43 116/76  Pulse: 80   Resp: 16   Temp: (!) 96.6 F (35.9 C)   SpO2: 98%    Vitals:   04/06/19 1304  Weight: 241 lb 12.8 oz (109.7 kg)  Height: 5\' 4"  (1.626 m)   Body mass index is 41.5 kg/m. Ideal Body Weight: Weight in (lb) to have BMI = 25: 145.3  GEN: WDWN, NAD, Non-toxic, A & O x 3, obese, does  not appear acutely ill HEENT: Atraumatic, Normocephalic. Neck supple. No masses, No LAD.  PEERL, limited funduscopic exam normal Ears and Nose: No external deformity. CV: RRR, No M/G/R. No JVD. No thrill. No extra heart sounds. PULM: CTA B, no wheezes, crackles, rhonchi. No retractions. No resp. distress. No accessory muscle use. ABD: S, NT, ND, +BS. No rebound. No HSM. EXTR: No c/c/e NEURO sitting in wheelchair, is able to get to her feet with some assistance.  She is wearing a boot on her left foot PSYCH: Normally interactive. Conversant. Not depressed or anxious appearing.  Calm demeanor.  She has a stage II pressure ulcer present  on both buttocks, larger on the right than on the left I cleansed the area with peroxide, and applied a gel dressing  BP Readings from Last 3 Encounters:  04/06/19 116/76  03/11/19 (!) 129/59  02/15/19 122/78    Assessment and Plan: Sacral decubitus ulcer, stage II ([place]) - Plan: Ambulatory referral to Home Health  Hypokalemia - Plan: Comprehensive metabolic panel  Confusion - Plan: CBC, Comprehensive metabolic panel, TSH, Ambulatory referral to Neurology  Medication monitoring encounter - Plan: Comprehensive metabolic panel  Frailty - Plan: Ambulatory referral to Home Health  Urinary frequency - Plan: Urine culture, POCT urinalysis dipstick  Vision changes  Here today with several concerns.  She has a sacral decubitus ulcer which has been present for months.  She was supposed to have home health nursing, but this did not transpire for some reason.  I reordered home health today, applied a gel dressing and instructed patient on how to take care of this wound in the interim.  She will order some gel dressings online have on hand  Also ordered home health physical therapy to work on strengthening and ambulation  History of urinary frequency and UTI.  Obtain urine culture today  She is taking a significant med of diuretics and also potassium, noted to  have hypokalemia and hyponatremia at most recent labs about 1 month ago.  We will repeat these labs today  She has noted some nonspecific confusion and slowing of her mentation since she was quite ill over the summer.  Will obtain labs as above, and refer to neurology.  She may need an MRI brain, could have suffered a stroke or TIA while inpatient  She also has noted what she describes as floating green lights in both eyes for about 2 weeks.  My limited exam here today is normal, but I encouraged her to follow-up with her ophthalmologist or optometrist as soon as possible.  She does have an eye doctor she can see, and will see them soon as possible  Will plan further follow- up pending labs. >40 minutes spent in face to face time with patient, >50% spent in counselling or coordination of care   Signed [name], MD  Received urine dip -Suspect this is not a clean-catch due to difficulty in obtaining sample due to patient factors.  As she is not currently having symptoms, plan to wait for urine culture to treat if patient is in agreement   Received her labs 11/12-  Called her to discuss Anemia is new No blood in her stool, no dark or tarry stools TSH is low- she is taking 400 mcg of thyroid now Decrease to 3 thyroid pills or 300 mcg daily   I would like to change her from HCTZ to spiro to help correct her potassium  D/w pharm D- we can try D/C HCTZ and K, add spiro 25 daily- this may get her potassium to normal She is getting home health so hopefully they can help [place] monitor her K while we make this transition.  Pt is also ok with this plan  Results for orders placed or performed in visit on 04/06/19  CBC  Result Value Ref Range   WBC 10.3 4.0 - 10.5 K/uL   RBC 4.41 3.87 - 5.11 Mil/uL   Platelets 364.0 150.0 - 400.0 K/uL   Hemoglobin 10.9 (L) 12.0 - 15.0 g/dL   HCT 34.2 (L) 36.0 - 46.0 %   MCV 77.6 (L) 78.0 - 100.0 fl   MCHC 31.9  30.0 - 36.0 g/dL   RDW 18.6 (H) 11.5 - 15.5 %   Comprehensive metabolic panel  Result Value Ref Range   Sodium 133 (L) 135 - 145 mEq/L   Potassium 3.1 (L) 3.5 - 5.1 mEq/L   Chloride 86 (L) 96 - 112 mEq/L   CO2 36 (H) 19 - 32 mEq/L   Glucose, Bld 96 70 - 99 mg/dL   BUN 27 (H) 6 - 23 mg/dL   Creatinine, Ser 0.93 0.40 - 1.20 mg/dL   Total Bilirubin 0.6 0.2 - 1.2 mg/dL   Alkaline Phosphatase 119 (H) 39 - 117 U/L   AST 24 0 - 37 U/L   ALT 38 (H) 0 - 35 U/L   Total Protein 7.7 6.0 - 8.3 g/dL   Albumin 4.0 3.5 - 5.2 g/dL   GFR 61.11 >60.00 mL/min   Calcium 11.4 (H) 8.4 - 10.5 mg/dL  TSH  Result Value Ref Range   TSH 0.10 (L) 0.35 - 4.50 uIU/mL  POCT urinalysis dipstick  Result Value Ref Range   Color, UA yellow yellow   Clarity, UA cloudy (A) clear   Glucose, UA negative negative mg/dL   Bilirubin, UA negative negative   Ketones, POC UA negative negative mg/dL   Spec Grav, UA 1.010 1.010 - 1.025   Blood, UA negative negative   pH, UA 7.0 5.0 - 8.0   Protein Ur, POC negative negative mg/dL   Urobilinogen, UA 0.2 0.2 or 1.0 E.U./dL   Nitrite, UA Negative Negative   Leukocytes, UA Large (3+) (A) Negative   *Note: Due to a large number of results and/or encounters for the requested time period, some results have not been displayed. A complete set of results can be found in Results Review.   Called pt

## 2019-04-06 ENCOUNTER — Encounter: Payer: Self-pay | Admitting: Neurology

## 2019-04-06 ENCOUNTER — Ambulatory Visit: Payer: 59 | Admitting: Family Medicine

## 2019-04-06 ENCOUNTER — Other Ambulatory Visit: Payer: Self-pay

## 2019-04-06 ENCOUNTER — Encounter: Payer: Self-pay | Admitting: Family Medicine

## 2019-04-06 VITALS — BP 116/76 | HR 80 | Temp 96.6°F | Resp 16 | Ht 64.0 in | Wt 241.8 lb

## 2019-04-06 DIAGNOSIS — H539 Unspecified visual disturbance: Secondary | ICD-10-CM

## 2019-04-06 DIAGNOSIS — L89152 Pressure ulcer of sacral region, stage 2: Secondary | ICD-10-CM | POA: Diagnosis not present

## 2019-04-06 DIAGNOSIS — E876 Hypokalemia: Secondary | ICD-10-CM

## 2019-04-06 DIAGNOSIS — R410 Disorientation, unspecified: Secondary | ICD-10-CM | POA: Diagnosis not present

## 2019-04-06 DIAGNOSIS — Z5181 Encounter for therapeutic drug level monitoring: Secondary | ICD-10-CM

## 2019-04-06 DIAGNOSIS — R54 Age-related physical debility: Secondary | ICD-10-CM

## 2019-04-06 DIAGNOSIS — R350 Frequency of micturition: Secondary | ICD-10-CM

## 2019-04-06 LAB — COMPREHENSIVE METABOLIC PANEL
ALT: 38 U/L — ABNORMAL HIGH (ref 0–35)
AST: 24 U/L (ref 0–37)
Albumin: 4 g/dL (ref 3.5–5.2)
Alkaline Phosphatase: 119 U/L — ABNORMAL HIGH (ref 39–117)
BUN: 27 mg/dL — ABNORMAL HIGH (ref 6–23)
CO2: 36 mEq/L — ABNORMAL HIGH (ref 19–32)
Calcium: 11.4 mg/dL — ABNORMAL HIGH (ref 8.4–10.5)
Chloride: 86 mEq/L — ABNORMAL LOW (ref 96–112)
Creatinine, Ser: 0.93 mg/dL (ref 0.40–1.20)
GFR: 61.11 mL/min (ref >60.00)
Glucose, Bld: 96 mg/dL (ref 70–99)
Potassium: 3.1 mEq/L — ABNORMAL LOW (ref 3.5–5.1)
Sodium: 133 mEq/L — ABNORMAL LOW (ref 135–145)
Total Bilirubin: 0.6 mg/dL (ref 0.2–1.2)
Total Protein: 7.7 g/dL (ref 6.0–8.3)

## 2019-04-06 LAB — POCT URINALYSIS DIP (MANUAL ENTRY)
Bilirubin, UA: NEGATIVE
Blood, UA: NEGATIVE
Glucose, UA: NEGATIVE mg/dL
Ketones, POC UA: NEGATIVE mg/dL
Nitrite, UA: NEGATIVE
Protein Ur, POC: NEGATIVE mg/dL
Spec Grav, UA: 1.01 (ref 1.010–1.025)
Urobilinogen, UA: 0.2 E.U./dL
pH, UA: 7 (ref 5.0–8.0)

## 2019-04-06 LAB — CBC
HCT: 34.2 % — ABNORMAL LOW (ref 36.0–46.0)
Hemoglobin: 10.9 g/dL — ABNORMAL LOW (ref 12.0–15.0)
MCHC: 31.9 g/dL (ref 30.0–36.0)
MCV: 77.6 fl — ABNORMAL LOW (ref 78.0–100.0)
Platelets: 364 10*3/uL (ref 150.0–400.0)
RBC: 4.41 Mil/uL (ref 3.87–5.11)
RDW: 18.6 % — ABNORMAL HIGH (ref 11.5–15.5)
WBC: 10.3 10*3/uL (ref 4.0–10.5)

## 2019-04-06 LAB — TSH: TSH: 0.1 u[IU]/mL — ABNORMAL LOW (ref 0.35–4.50)

## 2019-04-06 NOTE — Patient Instructions (Addendum)
It was good to see you today-  I will be in touch with your labs asap I will set up home health nursing to work on your wound- you will likely need several weeks of treatment to get this pressure ulcer healed.  Please order some of the gel dressings that I gave you today from [place] to use.   We will also set up PT to come to your home and work with you on strength and balance If less lasix does as good of a job as more, take the lower dose  This should allow your potassium and sodium to be more normal    I will refer you to neurology for an evaluation - you may need an MRI of your head to make sure you did not have a stroke earlier this year   Please bring [place] a urine sample when you can- we will culture it for you  Please see your eye doctor as soon as you are able

## 2019-04-07 ENCOUNTER — Encounter: Payer: Self-pay | Admitting: Family Medicine

## 2019-04-08 LAB — URINE CULTURE
MICRO NUMBER:: 1089180
SPECIMEN QUALITY:: ADEQUATE

## 2019-04-09 MED ORDER — SPIRONOLACTONE 25 MG PO TABS: 25.0000 mg | ORAL_TABLET | Freq: Every day | ORAL | 2 refills | Status: DC

## 2019-04-12 ENCOUNTER — Encounter: Payer: Self-pay | Admitting: Family Medicine

## 2019-04-14 ENCOUNTER — Ambulatory Visit: Payer: 59 | Admitting: Family Medicine

## 2019-04-14 ENCOUNTER — Other Ambulatory Visit: Payer: Self-pay

## 2019-04-14 ENCOUNTER — Encounter: Payer: Self-pay | Admitting: Family Medicine

## 2019-04-14 ENCOUNTER — Other Ambulatory Visit: Payer: 59

## 2019-04-14 VITALS — BP 118/60 | HR 82 | Temp 96.4°F | Resp 17

## 2019-04-14 DIAGNOSIS — L89152 Pressure ulcer of sacral region, stage 2: Secondary | ICD-10-CM

## 2019-04-14 DIAGNOSIS — E876 Hypokalemia: Secondary | ICD-10-CM

## 2019-04-14 DIAGNOSIS — Z5181 Encounter for therapeutic drug level monitoring: Secondary | ICD-10-CM | POA: Diagnosis not present

## 2019-04-14 NOTE — Progress Notes (Signed)
[place] [facility] [address] [place], [place] [address] [phone]  Date:  04/14/2019   Name:  [name]   DOB:  28-Jun-1955   MRN:  [id number]  PCP:  Debbrah Alar, NP    Chief Complaint: Follow-up   History of Present Illness:  [name] is a 63 y.o. very pleasant female patient who presents with the following:  Here today for follow-up visit, lab work and wound care I most recently saw this patient on November 11 for several concerns History of kidney stones, bariatric surgery, iron deficiency anemia, atrial fibrillation, COPD, hyperparathyroidism status post operative treatment, sacral decubitus ulcer, chronic hypokalemia  At her most recent lab work, TSH was low.  I reduced her levothyroxine from 400 to 300 mcg-too early to recheck today She was taking Lasix, hydrochlorothiazide and potassium supplementation. However potassium was still low at 3.1. We had her DC hydrochlorothiazide and potassium, start spironolactone 25 mg She continues to take Lasix 40 twice daily  Here today with her niece Home health has still not contacted her.  I confirmed that her phone number is correct in our system She is using gel dressings on her sacral ulcer, was able to order some for her home use She feels like this his helping her heal She feels well  Patient Active Problem List   Diagnosis Date Noted  . Chronic anticoagulation 12/16/2018  . Abnormal nuclear stress test 12/16/2018  . Debilitated patient 12/16/2018  . Pressure injury of skin 12/13/2018  . UTI (urinary tract infection) 12/13/2018  . Pyelonephritis 11/21/2018  . Sepsis due to Escherichia coli (E. coli) ([place]) 11/28/2017  . E coli bacteremia 11/28/2017  . Sepsis ([place]) 11/24/2017  . Hypokalemia 12/10/2016  . Hypocalcemia 12/10/2016  . Recurrent falls 12/09/2016  . Right ureteral stone 07/17/2014  . Osteomyelitis ([place]) 06/14/2014  . Pernicious  anemia 02/20/2014  . Reflux esophagitis 02/02/2014  . Edema 11/23/2013  . Foot pain 03/31/2013  . Endometrial adenocarcinoma ([place]) 02/21/2013  . Trochanteric bursitis of left hip 09/13/2012  . Muscle cramps 08/12/2012  . Fatty liver 01/07/2012  . Mid back pain 09/30/2011  . Weight gain 07/02/2011  . Nonspecific abnormal unspecified cardiovascular function study 04/30/2011  . Hx of papillary thyroid carcinoma 04/07/2011  . Low back pain 04/01/2011  . PAF (paroxysmal atrial fibrillation) ([place]) 02/05/2011  . Hypoparathyroidism ([place]) 01/07/2011  . GERD 05/22/2009  . Leukocytosis 03/24/2009  . Morbid obesity ([place]) 12/20/2008  . RHINOSINUSITIS, RECURRENT 09/07/2008  . Vitamin D deficiency 05/10/2007  . Iron deficiency anemia secondary to malabsorption  05/10/2007  . Hypothyroidism 01/05/2007  . Essential hypertension 01/05/2007  . ALLERGIC RHINITIS 01/05/2007  . Asthma 01/05/2007    Past Medical History:  Diagnosis Date  . Abnormal Pap smear    years ago/no biopsy  . Allergic rhinitis   . Anemia, iron deficiency    iron infusion scheduled for 04/02/2018  . Aortic atherosclerosis ([place])   . Arthritis    back- lower  . Asthma   . Atrial fibrillation ([place]) August 2012   OFF XARELTO LAST MONTH DUE TO BLEEDING IN STOOL  . Bursitis of hip left  . Chronic diastolic (congestive) heart failure (HCC)    pt unaware of this  . COPD (chronic obstructive pulmonary disease) with chronic bronchitis ([place])   . Decreased pulses in feet   . Dysrhythmia    afib, followed by Dr. Stanford Breed   . Edema of  both legs   . Esophagitis 02/02/2014   Distal, linear erosions, noted on endoscopy  . Fatty liver 01/07/2012  . Fibroid 1974   fibroid cyst on left fallopian tube  . Fibromyalgia   . Foot ulcer ([place])    AREA HEALED RIGHT FOOT  . GERD (gastroesophageal reflux disease)   . Headache(784.0)    occasional sinus headache   . Hepatomegaly   . History of blood transfusion JULY 2015  . History of  cardiomegaly 12/06/2010   Noted on CT  . History of E. coli septicemia   . Hypertension   . Hypoparathyroidism ([place]) 01/07/2011  . Hypothyroidism   . Kidney stone 09-23-2015   passed on their own  . Morbid obesity ([place])   . MRSA infection   . Nephrolithiasis 2/16, 9/16   SEES DR Risa Grill  . Pernicious anemia 02/20/2014   followed by Debbrah Alar  . Pneumonia   . PONV (postoperative nausea and vomiting)   . Pyelonephritis 11/24/2018  . Rash    thighs and back  . Seizures ([place])    infancy secondary to fever  . Staph aureus infection   . Thoracic spondylosis 12/06/2010   Noted on CT  . Thyroid cancer ([place]) 2011   THYROIDECTOMY DONE  . Uterine cancer ([place]) 2014   Mirena IUD  . Vitamin B 12 deficiency   . Vitamin D deficiency     Past Surgical History:  Procedure Laterality Date  . AMPUTATION Right 05/18/2014   Procedure: RIGHT FIFTH RAY AMPUTATION FOOT;  Surgeon: Wylene Simmer, MD;  Location: [facility];  Service: Orthopedics;  Laterality: Right;  . APPENDECTOMY    . BUNIONECTOMY     bilateral  . COLONOSCOPY WITH PROPOFOL N/A 02/02/2014   Procedure: COLONOSCOPY WITH PROPOFOL;  Surgeon: Irene Shipper, MD;  [facility]: [facility];  Service: Endoscopy;  Laterality: N/A;  . cyst on ovary removed     . CYSTOSCOPY W/ RETROGRADES  03/03/2012   Procedure: CYSTOSCOPY WITH RETROGRADE PYELOGRAM;  Surgeon: Bernestine Amass, MD;  Location: [facility];  Service: Urology;  Laterality: Bilateral;  . CYSTOSCOPY W/ URETERAL STENT PLACEMENT Right 11/25/2017   Procedure: CYSTOSCOPY WITH RETROGRADE RIGHT URETERAL STENT PLACEMENT;  Surgeon: Franchot Gallo, MD;  Location: [facility];  Service: Urology;  Laterality: Right;  . CYSTOSCOPY W/ URETERAL STENT PLACEMENT Right 01/15/2018   Procedure: RIGHT STENT EXCHANGE;  Surgeon: Ardis Hughs, MD;  [facility];  Service: Urology;  Laterality: Right;  . CYSTOSCOPY WITH RETROGRADE PYELOGRAM, URETEROSCOPY AND STENT PLACEMENT Left 08/25/2012   Procedure: CYSTOSCOPY  WITH RETROGRADE PYELOGRAM, URETEROSCOPY;  Surgeon: Bernestine Amass, MD;  [facility];  Service: Urology;  Laterality: Left;  . CYSTOSCOPY WITH STENT PLACEMENT Left 01/15/2018   Procedure: LEFT STENT PLACEMENT;  Surgeon: Ardis Hughs, MD;  [facility];  Service: Urology;  Laterality: Left;  . CYSTOSCOPY WITH STENT PLACEMENT Right 01/22/2018   Procedure: RIGHT STENT REMOVAL;  Surgeon: Ardis Hughs, MD;  [facility];  Service: Urology;  Laterality: Right;  . CYSTOSCOPY/URETEROSCOPY/HOLMIUM LASER/STENT PLACEMENT Right 12/23/2017   Procedure: RIGHT URETEROSCOPY STONE REMOVAL, HOLMIUM LASER, RIGHT /STENT EXCHANGE;  Surgeon: Ardis Hughs, MD;  [facility];  Service: Urology;  Laterality: Right;  . CYSTOSCOPY/URETEROSCOPY/HOLMIUM LASER/STENT PLACEMENT Left 01/22/2018   Procedure: LEFT URETEROSCOPY STONE REMOVAL HOLMIUM LASER LEFT STENT Freddi Starr;  Surgeon: Ardis Hughs, MD;  [facility];  Service: Urology;  Laterality: Left;  . DILATION AND CURETTAGE OF UTERUS N/A 02/21/2013  Procedure: DILATATION AND CURETTAGE;  Surgeon: Lyman Speller, MD;  Location: [facility];  Service: Gynecology;  Laterality: N/A;  . DILATION AND CURETTAGE OF UTERUS N/A 04/09/2015   Procedure: [place] IUD removal;  Surgeon: Megan Salon, MD;  Location: [facility];  Service: Gynecology;  Laterality: N/A;  Patient weight 307lbs  . ESOPHAGOGASTRODUODENOSCOPY (EGD) WITH PROPOFOL N/A 02/02/2014   Procedure: ESOPHAGOGASTRODUODENOSCOPY (EGD) WITH PROPOFOL;  Surgeon: Irene Shipper, MD;  [facility];  Service: Endoscopy;  Laterality: N/A;  . GASTRIC BYPASS  1974  . HOLMIUM LASER APPLICATION Left 0000000   Procedure: HOLMIUM LASER APPLICATION;  Surgeon: Bernestine Amass, MD;  [facility];  Service: Urology;  Laterality: Left;  . LITHOTRIPSY  03/2012  . LITHOTRIPSY  2/16  . THYROIDECTOMY  05/15/2010  . TONSILLECTOMY AND ADENOIDECTOMY    .  URETEROSCOPY  03/03/2012   Procedure: URETEROSCOPY;  Surgeon: Bernestine Amass, MD;  [facility];  Service: Urology;  Laterality: Left;  . URETEROSCOPY WITH HOLMIUM LASER LITHOTRIPSY Bilateral 01/15/2018   Procedure: CYSTOSCOPY/BILATERAL URETEROSCOPY WITH HOLMIUM LASER LITHOTRIPSY STONE REMOVAL;  Surgeon: Ardis Hughs, MD;  [facility];  Service: Urology;  Laterality: Bilateral;    Social History   Tobacco Use  . Smoking status: Former Smoker    Packs/day: 0.50    Years: 25.00    Pack years: 12.50    Types: Cigarettes    Start date: 12/24/1970    Quit date: 05/27/1995    Years since quitting: 23.8  . Smokeless tobacco: Never Used  . Tobacco comment: quit smoking 19 years ago  Substance Use Topics  . Alcohol use: Yes    Alcohol/week: 0.0 standard drinks    Comment: 1/2 glass per month  . Drug use: No    Family History  Problem Relation Age of Onset  . Hypertension Father   . Diabetes Father   . Lung cancer Father   . Heart attack Father        MI at age 62  . Hypertension Mother   . Hyperthyroidism Mother   . Heart disease Mother   . Heart attack Mother        MI at age 35  . Asthma Brother   . Hypertension Brother        younger  . Heart disease Brother        older  . Colon cancer Neg Hx   . Esophageal cancer Neg Hx   . Stomach cancer Neg Hx   . Kidney disease Neg Hx   . Liver disease Neg Hx     Allergies  Allergen Reactions  . Other Anaphylaxis    Reaction to tree nuts  . Amoxicillin-Pot Clavulanate Other (See Comments)    headache  . Ciprofloxacin Nausea Only and Rash  . Food Hives and Other (See Comments)    Potato  . Keflex [Cephalexin] Rash  . Penicillins Rash    Headache. And hives - Dose not tolerate Cephalosporin either   Has patient had a PCN reaction causing immediate rash, facial/tongue/throat swelling, SOB or lightheadedness with hypotension: No Has patient had a PCN reaction causing severe rash involving mucus membranes or skin  necrosis: No Has patient had a PCN reaction that required hospitalization: No Has patient had a PCN reaction occurring within the last 10 years: Yes (reaction was May 2020) If all of the above answers are "NO", then may proce  . Rocephin [Ceftriaxone] Rash  . Tomato Hives  Medication list has been reviewed and updated.  Current Outpatient Medications on File Prior to Visit  Medication Sig Dispense Refill  . acetaminophen (TYLENOL) 325 MG tablet Take 2 tablets (650 mg total) by mouth every 6 (six) hours as needed for mild pain or moderate pain. 60 tablet 0  . albuterol (PROVENTIL HFA;VENTOLIN HFA) 108 (90 Base) MCG/ACT inhaler Inhale 2 puffs into the lungs every 6 (six) hours as needed for wheezing or shortness of breath. 1 Inhaler 5  . albuterol (PROVENTIL) (2.5 MG/3ML) 0.083% nebulizer solution Take 3 mLs (2.5 mg total) by nebulization every 6 (six) hours as needed for wheezing or shortness of breath. 150 mL 1  . calcitRIOL (ROCALTROL) 0.5 MCG capsule Take 2 mcg by mouth daily.     . calcium elemental as carbonate (TUMS ULTRA 1000) 400 MG chewable tablet Chew 2,000 mg by mouth 2 (two) times daily.    . cyanocobalamin (,VITAMIN B-12,) 1000 MCG/ML injection Inject 1 mL (1,000 mcg total) into the skin every 30 (thirty) days. 3 mL 4  . desoximetasone (TOPICORT) 0.05 % cream Apply topically 2 (two) times daily as needed. 60 g 0  . diclofenac sodium (VOLTAREN) 1 % GEL Apply 2 g topically 4 (four) times daily. 100 g 3  . diltiazem (CARDIZEM CD) 120 MG 24 hr capsule Take 1 capsule (120 mg total) by mouth daily. 90 capsule 1  . diphenhydrAMINE (BENADRYL) 25 mg capsule Take 25 mg by mouth every 6 (six) hours as needed for itching.     [name] ELIQUIS 5 MG TABS tablet TAKE 1 TABLET BY MOUTH TWO  TIMES DAILY (Patient taking differently: Take 5 mg by mouth 2 (two) times daily. ) 180 tablet 1  . EPINEPHrine (EPIPEN 2-PAK) 0.3 mg/0.3 mL IJ SOAJ injection Inject 0.3 mg into the muscle once as needed for  anaphylaxis (severe allergic reaction).     . furosemide (LASIX) 40 MG tablet TAKE 2 TABLETS BY MOUTH  DAILY IN THE MORNING AND 1  TABLET IN THE EVENING 270 tablet 3  . hydrocortisone cream 1 % Apply topically 4 (four) times daily as needed for itching. 30 g 0  . levocetirizine (XYZAL) 5 MG tablet TAKE 1 TABLET BY MOUTH  EVERY EVENING 90 tablet 3  . levothyroxine (SYNTHROID, LEVOTHROID) 100 MCG tablet Take 400 mcg by mouth daily before breakfast.     . Melatonin 5 MG TABS Take 5 mg by mouth at bedtime as needed (for sleep).     . montelukast (SINGULAIR) 10 MG tablet Take 1 tablet (10 mg total) by mouth at bedtime. 30 tablet 3  . mupirocin ointment (BACTROBAN) 2 % Place 1 application into the nose 2 (two) times daily. 22 g 0  . nitrofurantoin (MACRODANTIN) 50 MG capsule Take 1 capsule (50 mg total) by mouth at bedtime.    [name] nystatin (MYCOSTATIN/NYSTOP) powder Apply topically 2 (two) times daily. 45 g 1  . nystatin cream (MYCOSTATIN) Apply 1 application topically 2 (two) times daily. 30 g 2  . omeprazole (PRILOSEC) 40 MG capsule Take 1 capsule (40 mg total) by mouth daily. 90 capsule 0  . ondansetron (ZOFRAN) 4 MG tablet Take 1 tablet (4 mg total) by mouth every 8 (eight) hours as needed for nausea or vomiting. 30 tablet 0  . potassium chloride 20 MEQ/15ML (10%) SOLN Take 20 mL by mouth 3 times daily 450 mL 5  . senna (SENOKOT) 8.6 MG TABS tablet Take 1 tablet (8.6 mg total) by mouth 2 (two) times daily.  120 tablet 0  . spironolactone (ALDACTONE) 25 MG tablet Take 1 tablet (25 mg total) by mouth daily. 30 tablet 2  . SYMBICORT 160-4.5 MCG/ACT inhaler INHALE 2 PUFFS BY MOUTH TWO TIMES DAILY 30.6 g 1  . Syringe/Needle, Disp, (SYRINGE 3CC/20GX1") 20G X 1" 3 ML MISC Use monthly as directed 50 each 0  . traMADol (ULTRAM) 50 MG tablet TAKE 1 TABLET(50 MG) BY MOUTH EVERY 12 HOURS AS NEEDED 30 tablet 0  . Vitamin D, Ergocalciferol, (DRISDOL) 1.25 MG (50000 UT) CAPS capsule TAKE 1 CAPSULE BY MOUTH  EVERY  MONDAY, WEDNESDAY,  AND FRIDAY. 39 capsule 1  . zafirlukast (ACCOLATE) 20 MG tablet TAKE 1 TABLET BY MOUTH  TWICE DAILY BEFORE MEALS 180 tablet 3   No current facility-administered medications on file prior to visit.     Review of Systems:  As per HPI- otherwise negative.   Physical Examination: Vitals:   04/14/19 1444  BP: 118/60  Pulse: 82  Resp: 17  Temp: (!) 96.4 F (35.8 C)  SpO2: 98%   There were no vitals filed for this visit. There is no height or weight on file to calculate BMI. Ideal Body Weight:    GEN: WDWN, NAD, Non-toxic, A & O x 3, obese, sitting in WC,  Looks well  HEENT: Atraumatic, Normocephalic. Neck supple. No masses, No LAD. Ears and Nose: No external deformity. CV: RRR, No M/G/R. No JVD. No thrill. No extra heart sounds. PULM: CTA B, no wheezes, crackles, rhonchi. No retractions. No resp. distress. No accessory muscle use. EXTR: No c/c/e NEURO WC.  Able to get to her feet and lean on exam table with help PSYCH: Normally interactive. Conversant. Not depressed or anxious appearing.  Calm demeanor.  Sacral decub ulcer, stage 3, of both buttocks by the gluteal cleft.  Cleaned and replaced gel dressings  Assessment and Plan: Hypokalemia - Plan: Basic metabolic panel  Sacral decubitus ulcer, stage II (HCC)  Medication monitoring encounter - Plan: CBC, Basic metabolic panel  Following up on ulcer today BP fine Check K as recent med change may have altered her level Will continue to work on getting [facility] for her   Signed [name], MD

## 2019-04-14 NOTE — Patient Instructions (Signed)
I will be in touch with your labs asap- please make an appt to see me next week in case we can't get home health in time

## 2019-04-15 ENCOUNTER — Telehealth: Payer: Self-pay | Admitting: Family

## 2019-04-15 ENCOUNTER — Other Ambulatory Visit: Payer: Self-pay | Admitting: *Deleted

## 2019-04-15 ENCOUNTER — Encounter: Payer: Self-pay | Admitting: Family Medicine

## 2019-04-15 LAB — CBC
HCT: 33.2 % — ABNORMAL LOW (ref 36.0–46.0)
Hemoglobin: 10.3 g/dL — ABNORMAL LOW (ref 12.0–15.0)
MCHC: 30.9 g/dL (ref 30.0–36.0)
MCV: 79.5 fl (ref 78.0–100.0)
Platelets: 321 10*3/uL (ref 150.0–400.0)
RBC: 4.18 Mil/uL (ref 3.87–5.11)
RDW: 18.3 % — ABNORMAL HIGH (ref 11.5–15.5)
WBC: 9.8 10*3/uL (ref 4.0–10.5)

## 2019-04-15 LAB — BASIC METABOLIC PANEL
BUN: 26 mg/dL — ABNORMAL HIGH (ref 6–23)
CO2: 29 mEq/L (ref 19–32)
Calcium: 10.8 mg/dL — ABNORMAL HIGH (ref 8.4–10.5)
Chloride: 97 mEq/L (ref 96–112)
Creatinine, Ser: 0.99 mg/dL (ref 0.40–1.20)
GFR: 56.85 mL/min — ABNORMAL LOW (ref >60.00)
Glucose, Bld: 91 mg/dL (ref 70–99)
Potassium: 4.5 mEq/L (ref 3.5–5.1)
Sodium: 135 mEq/L (ref 135–145)

## 2019-04-15 MED ORDER — POTASSIUM CHLORIDE 20 MEQ/15ML (10%) PO SOLN: ORAL | 5 refills | Status: DC

## 2019-04-15 NOTE — Telephone Encounter (Signed)
Eugene Gavia, NP; Trenda Moots; Jiles Prows, [place]        [facility] is not able to take pt due to staffing.  [facility] is not able to set up until next week at least due to staffing.  [facility] is unable to accept due to insurance contact with [employer] being on hold.  [facility] is unable to accept due to pts insurance.  [facility] would not have availability for 10 days.   Previous Messages  ----- Message -----  From: Danny Lawless  Sent: 04/15/2019 11:40 AM EST  To: Debbrah Alar, NP, Margot Ables  Subject: RE: Urgent SN, PT/OT referral           we are full for nursing until next week. im trying to get it referred out as well but not many agencies want to take uhc.   ----- Message -----  From: Margot Ables  Sent: 04/15/2019 11:06 AM EST  To: Danny Lawless  Subject: Urgent SN, PT/OT referral             [facility]ody is out and I'm trying to get an update for Debbrah Alar, NP on this pt.  [name] MRN [id number] DOB 04-21-1956   She said this is an urgent need for pt to have SN for wound care and PT/OT at home. Pt is not able to ambulate outside the home safely. Please advise.   Also, I realize we do not have your phone # so I was not able to call you. Can you please send me your phone # too?   Thank you!

## 2019-04-18 ENCOUNTER — Encounter: Payer: Self-pay | Admitting: Family Medicine

## 2019-04-20 ENCOUNTER — Ambulatory Visit: Payer: 59 | Admitting: Family Medicine

## 2019-04-20 ENCOUNTER — Other Ambulatory Visit: Payer: Self-pay

## 2019-04-20 ENCOUNTER — Encounter: Payer: Self-pay | Admitting: Family Medicine

## 2019-04-20 VITALS — BP 118/68 | HR 74 | Temp 96.5°F | Resp 18

## 2019-04-20 DIAGNOSIS — E876 Hypokalemia: Secondary | ICD-10-CM | POA: Diagnosis not present

## 2019-04-20 DIAGNOSIS — D649 Anemia, unspecified: Secondary | ICD-10-CM | POA: Diagnosis not present

## 2019-04-20 DIAGNOSIS — L89152 Pressure ulcer of sacral region, stage 2: Secondary | ICD-10-CM

## 2019-04-20 DIAGNOSIS — E038 Other specified hypothyroidism: Secondary | ICD-10-CM

## 2019-04-20 NOTE — Progress Notes (Addendum)
[facility] [place], [place], [place] [address] [phone]  Date:  04/20/2019   Name:  [name]   DOB:  [birth year]   MRN:  [id number]  PCP:  Debbrah Alar, NP    Chief Complaint: Sacral Decub Ulcer (5 day follow up)   History of Present Illness:  [name] is a 63 y.o. very pleasant female patient who presents with the following:  Here today for a follow-up visit and wound care We have been treating her for a stage 2 sacral decub ulcer with gel dressings Also managing her hypokalemia with addition of spironolactone to her regimen Need to follow-up on microcytic anemia today- new this year- with ferritin and IFOB testing  Patient is not aware of any blood in her stool  We also adjusted her thyroid replacement dosage a few weeks ago- due to pandemic will take opportunity to recheck today  cardizem eliquis Lasix 40 BID Synthroid 351mcg Spiro 25 daily symbicort accolate  She is generally feeling well On Monday she had a fall- she thinks that her left knee or ankle might have given out No LOC The fire dept had to come and help her up- they did not think she was seriously injured.  She has some pain in her left ankle, a bruise on her left arm and on her left hip  Her sister-in-law is helping change the dressings on her behind every 3 days.  We think she is making progress.  Unfortunately we are having a very hard time getting her set up with home health.  There seems to be some issue with her insurance  Patient recalls that she did have to be admitted and have a blood transfusion in the 70s-the cause of her blood loss was also uncertain at that time  Patient Active Problem List   Diagnosis Date Noted  . Chronic anticoagulation 12/16/2018  . Abnormal nuclear stress test 12/16/2018  . Debilitated patient 12/16/2018  . Pressure injury of skin 12/13/2018  . UTI (urinary tract infection)  12/13/2018  . Pyelonephritis 11/21/2018  . Sepsis due to Escherichia coli (E. coli) ([place]) 11/28/2017  . E coli bacteremia 11/28/2017  . Sepsis ([place]) 11/24/2017  . Hypokalemia 12/10/2016  . Hypocalcemia 12/10/2016  . Recurrent falls 12/09/2016  . Right ureteral stone 07/17/2014  . Osteomyelitis ([place]) 06/14/2014  . Pernicious anemia 02/20/2014  . Reflux esophagitis 02/02/2014  . Edema 11/23/2013  . Foot pain 03/31/2013  . Endometrial adenocarcinoma ([place]) 02/21/2013  . Trochanteric bursitis of left hip 09/13/2012  . Muscle cramps 08/12/2012  . Fatty liver 01/07/2012  . Mid back pain 09/30/2011  . Weight gain 07/02/2011  . Nonspecific abnormal unspecified cardiovascular function study 04/30/2011  . Hx of papillary thyroid carcinoma 04/07/2011  . Low back pain 04/01/2011  . PAF (paroxysmal atrial fibrillation) ([place]) 02/05/2011  . Hypoparathyroidism ([place]) 01/07/2011  . GERD 05/22/2009  . Leukocytosis 03/24/2009  . Morbid obesity ([place]) 12/20/2008  . RHINOSINUSITIS, RECURRENT 09/07/2008  . Vitamin D deficiency 05/10/2007  . Iron deficiency anemia secondary to malabsorption  05/10/2007  . Hypothyroidism 01/05/2007  . Essential hypertension 01/05/2007  . ALLERGIC RHINITIS 01/05/2007  . Asthma 01/05/2007    Past Medical History:  Diagnosis Date  . Abnormal Pap smear    years ago/no biopsy  . Allergic rhinitis   . Anemia, iron deficiency    iron infusion scheduled for 04/02/2018  . Aortic atherosclerosis ([place])   .  Arthritis    back- lower  . Asthma   . Atrial fibrillation ([place]) August 2012   OFF XARELTO LAST MONTH DUE TO BLEEDING IN STOOL  . Bursitis of hip left  . Chronic diastolic (congestive) heart failure (HCC)    pt unaware of this  . COPD (chronic obstructive pulmonary disease) with chronic bronchitis ([place])   . Decreased pulses in feet   . Dysrhythmia    afib, followed by Dr. Stanford Breed   . Edema of both legs   . Esophagitis 02/02/2014   Distal, linear erosions,  noted on endoscopy  . Fatty liver 01/07/2012  . Fibroid 1974   fibroid cyst on left fallopian tube  . Fibromyalgia   . Foot ulcer ([place])    AREA HEALED RIGHT FOOT  . GERD (gastroesophageal reflux disease)   . Headache(784.0)    occasional sinus headache   . Hepatomegaly   . History of blood transfusion JULY 2015  . History of cardiomegaly 12/06/2010   Noted on CT  . History of E. coli septicemia   . Hypertension   . Hypoparathyroidism ([place]) 01/07/2011  . Hypothyroidism   . Kidney stone 10-11-2015   passed on their own  . Morbid obesity ([place])   . MRSA infection   . Nephrolithiasis 2/16, 9/16   SEES DR Risa Grill  . Pernicious anemia 02/20/2014   followed by Debbrah Alar  . Pneumonia   . PONV (postoperative nausea and vomiting)   . Pyelonephritis 11/24/2018  . Rash    thighs and back  . Seizures ([place])    infancy secondary to fever  . Staph aureus infection   . Thoracic spondylosis 12/06/2010   Noted on CT  . Thyroid cancer ([place]) 2011   THYROIDECTOMY DONE  . Uterine cancer ([place]) 2014   Mirena IUD  . Vitamin B 12 deficiency   . Vitamin D deficiency     Past Surgical History:  Procedure Laterality Date  . AMPUTATION Right 05/18/2014   Procedure: RIGHT FIFTH RAY AMPUTATION FOOT;  Surgeon: Wylene Simmer, MD;  Location: [facility];  Service: Orthopedics;  Laterality: Right;  . APPENDECTOMY    . BUNIONECTOMY     bilateral  . COLONOSCOPY WITH PROPOFOL N/A 02/02/2014   Procedure: COLONOSCOPY WITH PROPOFOL;  Surgeon: Irene Shipper, MD;  [facility]: [facility];  Service: Endoscopy;  Laterality: N/A;  . cyst on ovary removed     . CYSTOSCOPY W/ RETROGRADES  03/03/2012   Procedure: CYSTOSCOPY WITH RETROGRADE PYELOGRAM;  Surgeon: Bernestine Amass, MD;  Location: [facility];  Service: Urology;  Laterality: Bilateral;  . CYSTOSCOPY W/ URETERAL STENT PLACEMENT Right 11/25/2017   Procedure: CYSTOSCOPY WITH RETROGRADE RIGHT URETERAL STENT PLACEMENT;  Surgeon: Franchot Gallo, MD;  Location: [facility];   Service: Urology;  Laterality: Right;  . CYSTOSCOPY W/ URETERAL STENT PLACEMENT Right 01/15/2018   Procedure: RIGHT STENT EXCHANGE;  Surgeon: Ardis Hughs, MD;  [facility];  Service: Urology;  Laterality: Right;  . CYSTOSCOPY WITH RETROGRADE PYELOGRAM, URETEROSCOPY AND STENT PLACEMENT Left 08/25/2012   Procedure: CYSTOSCOPY WITH RETROGRADE PYELOGRAM, URETEROSCOPY;  Surgeon: Bernestine Amass, MD;  [facility];  Service: Urology;  Laterality: Left;  . CYSTOSCOPY WITH STENT PLACEMENT Left 01/15/2018   Procedure: LEFT STENT PLACEMENT;  Surgeon: Ardis Hughs, MD;  [facility];  Service: Urology;  Laterality: Left;  . CYSTOSCOPY WITH STENT PLACEMENT Right 01/22/2018   Procedure: RIGHT STENT REMOVAL;  Surgeon: Ardis Hughs, MD;  [facility];  Service: Urology;  Laterality: Right;  . CYSTOSCOPY/URETEROSCOPY/HOLMIUM LASER/STENT PLACEMENT Right 12/23/2017   Procedure: RIGHT URETEROSCOPY STONE REMOVAL, HOLMIUM LASER, RIGHT /STENT EXCHANGE;  Surgeon: Ardis Hughs, MD;  [facility];  Service: Urology;  Laterality: Right;  . CYSTOSCOPY/URETEROSCOPY/HOLMIUM LASER/STENT PLACEMENT Left 01/22/2018   Procedure: LEFT URETEROSCOPY STONE REMOVAL HOLMIUM LASER LEFT STENT Freddi Starr;  Surgeon: Ardis Hughs, MD;  [facility];  Service: Urology;  Laterality: Left;  . DILATION AND CURETTAGE OF UTERUS N/A 02/21/2013   Procedure: DILATATION AND CURETTAGE;  Surgeon: Lyman Speller, MD;  Location: [facility];  Service: Gynecology;  Laterality: N/A;  . DILATION AND CURETTAGE OF UTERUS N/A 04/09/2015   Procedure: Willis IUD removal;  Surgeon: Megan Salon, MD;  Location: [facility];  Service: Gynecology;  Laterality: N/A;  Patient weight 307lbs  . ESOPHAGOGASTRODUODENOSCOPY (EGD) WITH PROPOFOL N/A 02/02/2014   Procedure: ESOPHAGOGASTRODUODENOSCOPY (EGD) WITH PROPOFOL;  Surgeon: Irene Shipper, MD;  [facility];  Service:  Endoscopy;  Laterality: N/A;  . GASTRIC BYPASS  1974  . HOLMIUM LASER APPLICATION Left 0000000   Procedure: HOLMIUM LASER APPLICATION;  Surgeon: Bernestine Amass, MD;  [facility];  Service: Urology;  Laterality: Left;  . LITHOTRIPSY  03/2012  . LITHOTRIPSY  2/16  . THYROIDECTOMY  05/15/2010  . TONSILLECTOMY AND ADENOIDECTOMY    . URETEROSCOPY  03/03/2012   Procedure: URETEROSCOPY;  Surgeon: Bernestine Amass, MD;  [facility];  Service: Urology;  Laterality: Left;  . URETEROSCOPY WITH HOLMIUM LASER LITHOTRIPSY Bilateral 01/15/2018   Procedure: CYSTOSCOPY/BILATERAL URETEROSCOPY WITH HOLMIUM LASER LITHOTRIPSY STONE REMOVAL;  Surgeon: Ardis Hughs, MD;  [facility];  Service: Urology;  Laterality: Bilateral;    Social History   Tobacco Use  . Smoking status: Former Smoker    Packs/day: 0.50    Years: 25.00    Pack years: 12.50    Types: Cigarettes    Start date: 12/24/1970    Quit date: 05/27/1995    Years since quitting: 23.9  . Smokeless tobacco: Never Used  . Tobacco comment: quit smoking 19 years ago  Substance Use Topics  . Alcohol use: Yes    Alcohol/week: 0.0 standard drinks    Comment: 1/2 glass per month  . Drug use: No    Family History  Problem Relation Age of Onset  . Hypertension Father   . Diabetes Father   . Lung cancer Father   . Heart attack Father        MI at age 21  . Hypertension Mother   . Hyperthyroidism Mother   . Heart disease Mother   . Heart attack Mother        MI at age 60  . Asthma Brother   . Hypertension Brother        younger  . Heart disease Brother        older  . Colon cancer Neg Hx   . Esophageal cancer Neg Hx   . Stomach cancer Neg Hx   . Kidney disease Neg Hx   . Liver disease Neg Hx     Allergies  Allergen Reactions  . Other Anaphylaxis    Reaction to tree nuts  . Amoxicillin-Pot Clavulanate Other (See Comments)    headache  . Ciprofloxacin Nausea Only and Rash  . Food Hives and Other (See Comments)     Potato  . Keflex [Cephalexin] Rash  . Penicillins Rash    Headache. And hives - Dose not tolerate Cephalosporin either  Has patient had a PCN reaction causing immediate rash, facial/tongue/throat swelling, SOB or lightheadedness with hypotension: No Has patient had a PCN reaction causing severe rash involving mucus membranes or skin necrosis: No Has patient had a PCN reaction that required hospitalization: No Has patient had a PCN reaction occurring within the last 10 years: Yes (reaction was May 2020) If all of the above answers are "NO", then may proce  . Rocephin [Ceftriaxone] Rash  . Tomato Hives    Medication list has been reviewed and updated.  Current Outpatient Medications on File Prior to Visit  Medication Sig Dispense Refill  . acetaminophen (TYLENOL) 325 MG tablet Take 2 tablets (650 mg total) by mouth every 6 (six) hours as needed for mild pain or moderate pain. 60 tablet 0  . albuterol (PROVENTIL HFA;VENTOLIN HFA) 108 (90 Base) MCG/ACT inhaler Inhale 2 puffs into the lungs every 6 (six) hours as needed for wheezing or shortness of breath. 1 Inhaler 5  . albuterol (PROVENTIL) (2.5 MG/3ML) 0.083% nebulizer solution Take 3 mLs (2.5 mg total) by nebulization every 6 (six) hours as needed for wheezing or shortness of breath. 150 mL 1  . calcitRIOL (ROCALTROL) 0.5 MCG capsule Take 2 mcg by mouth daily.     . calcium elemental as carbonate (TUMS ULTRA 1000) 400 MG chewable tablet Chew 2,000 mg by mouth 2 (two) times daily.    . cyanocobalamin (,VITAMIN B-12,) 1000 MCG/ML injection Inject 1 mL (1,000 mcg total) into the skin every 30 (thirty) days. 3 mL 4  . desoximetasone (TOPICORT) 0.05 % cream Apply topically 2 (two) times daily as needed. 60 g 0  . diclofenac sodium (VOLTAREN) 1 % GEL Apply 2 g topically 4 (four) times daily. 100 g 3  . diphenhydrAMINE (BENADRYL) 25 mg capsule Take 25 mg by mouth every 6 (six) hours as needed for itching.     [name] ELIQUIS 5 MG TABS tablet TAKE 1  TABLET BY MOUTH TWO  TIMES DAILY (Patient taking differently: Take 5 mg by mouth 2 (two) times daily. ) 180 tablet 1  . EPINEPHrine (EPIPEN 2-PAK) 0.3 mg/0.3 mL IJ SOAJ injection Inject 0.3 mg into the muscle once as needed for anaphylaxis (severe allergic reaction).     . furosemide (LASIX) 40 MG tablet TAKE 2 TABLETS BY MOUTH  DAILY IN THE MORNING AND 1  TABLET IN THE EVENING 270 tablet 3  . hydrocortisone cream 1 % Apply topically 4 (four) times daily as needed for itching. 30 g 0  . levocetirizine (XYZAL) 5 MG tablet TAKE 1 TABLET BY MOUTH  EVERY EVENING 90 tablet 3  . levothyroxine (SYNTHROID, LEVOTHROID) 100 MCG tablet Take 400 mcg by mouth daily before breakfast.     . Melatonin 5 MG TABS Take 5 mg by mouth at bedtime as needed (for sleep).     . montelukast (SINGULAIR) 10 MG tablet Take 1 tablet (10 mg total) by mouth at bedtime. 30 tablet 3  . mupirocin ointment (BACTROBAN) 2 % Place 1 application into the nose 2 (two) times daily. 22 g 0  . nitrofurantoin (MACRODANTIN) 50 MG capsule Take 1 capsule (50 mg total) by mouth at bedtime.    [name] nystatin (MYCOSTATIN/NYSTOP) powder Apply topically 2 (two) times daily. 45 g 1  . nystatin cream (MYCOSTATIN) Apply 1 application topically 2 (two) times daily. 30 g 2  . omeprazole (PRILOSEC) 40 MG capsule Take 1 capsule (40 mg total) by mouth daily. 90 capsule 0  . ondansetron (ZOFRAN) 4 MG  tablet Take 1 tablet (4 mg total) by mouth every 8 (eight) hours as needed for nausea or vomiting. 30 tablet 0  . potassium chloride 20 MEQ/15ML (10%) SOLN Take 20 mL by mouth 3 times daily 450 mL 5  . senna (SENOKOT) 8.6 MG TABS tablet Take 1 tablet (8.6 mg total) by mouth 2 (two) times daily. 120 tablet 0  . spironolactone (ALDACTONE) 25 MG tablet Take 1 tablet (25 mg total) by mouth daily. 30 tablet 2  . SYMBICORT 160-4.5 MCG/ACT inhaler INHALE 2 PUFFS BY MOUTH TWO TIMES DAILY 30.6 g 1  . Syringe/Needle, Disp, (SYRINGE 3CC/20GX1") 20G X 1" 3 ML MISC Use monthly as  directed 50 each 0  . traMADol (ULTRAM) 50 MG tablet TAKE 1 TABLET(50 MG) BY MOUTH EVERY 12 HOURS AS NEEDED 30 tablet 0  . Vitamin D, Ergocalciferol, (DRISDOL) 1.25 MG (50000 UT) CAPS capsule TAKE 1 CAPSULE BY MOUTH  EVERY MONDAY, WEDNESDAY,  AND FRIDAY. 39 capsule 1  . zafirlukast (ACCOLATE) 20 MG tablet TAKE 1 TABLET BY MOUTH  TWICE DAILY BEFORE MEALS 180 tablet 3  . diltiazem (CARDIZEM CD) 120 MG 24 hr capsule Take 1 capsule (120 mg total) by mouth daily. 90 capsule 1   No current facility-administered medications on file prior to visit.     Review of Systems:  As per HPI- otherwise negative.  No fever or chills, no chest pain or shortness of breath Physical Examination: Vitals:   04/20/19 1515  BP: 118/68  Pulse: 74  Resp: 18  Temp: (!) 96.5 F (35.8 C)  SpO2: 98%   There were no vitals filed for this visit. There is no height or weight on file to calculate BMI. Ideal Body Weight:    GEN: WDWN, NAD, Non-toxic, A & O x 3, obese, appears her normal self HEENT: Atraumatic, Normocephalic. Neck supple. No masses, No LAD. Ears and Nose: No external deformity. CV: RRR, No M/G/R. No JVD. No thrill. No extra heart sounds. PULM: CTA B, no wheezes, crackles, rhonchi. No retractions. No resp. distress. No accessory muscle use. EXTR: No c/c/e Examined patient's left ankle.  She has some mild tenderness laterally, but it does not seem to be bony tenderness.  No bruising or effusion is noted. Of note patient's left great toe is located more medially than is normal.  She wears a bandage around it to prevent rubbing.  Patient reports that she hopes to have surgery at some point to remove this toe entirely-she is under the care of a podiatrist.  The left foot is warm and well perfused There is a small bruise over the left hip, and a small bruise on the left arm.  No evidence of fracture at either location NEURO sitting in wheelchair, able to stand with support PSYCH: Normally interactive.  Conversant. Not depressed or anxious appearing.  Calm demeanor.  Sacral decubitus ulcer is making progress.  The left side is substantially improved.  The right side is slower, but she does appear to have a healthier wound bed and some granulation. Digital rectal exam was performed to collect I fob  Assessment and Plan: Hypokalemia - Plan: Basic metabolic panel  Sacral decubitus ulcer, stage II (HCC)  Anemia, unspecified type - Plan: CBC, Ferritin, POCT Occult Blood Stool  Other specified hypothyroidism - Plan: TSH  Following up today for a couple of concerns Blood pressure is well controlled Check BMP today for potassium status Repeat CBC, ferritin IFOB negative Sacral decubitus ulcer is making progress, she would  benefit from home health for more intensive wound management.  We will continue to try and arrange this for her  Patient fell a couple of days ago and sustained minor injuries.  I have offered to x-ray her ankle today, she declines.  She will let me know if any other concerns  This visit occurred during the SARS-CoV-2 public health emergency.  Safety protocols were in place, including screening questions prior to the visit, additional usage of staff PPE, and extensive cleaning of exam room while observing appropriate contact time as indicated for disinfecting solutions.    Signed [name], MD  Received her labs 11/28  Results for orders placed or performed in visit on 04/20/19  CBC  Result Value Ref Range   WBC 8.2 3.8 - 10.8 Thousand/uL   RBC 4.24 3.80 - 5.10 Million/uL   Hemoglobin 10.5 (L) 11.7 - 15.5 g/dL   HCT 34.2 (L) 35.0 - 45.0 %   MCV 80.7 80.0 - 100.0 fL   MCH 24.8 (L) 27.0 - 33.0 pg   MCHC 30.7 (L) 32.0 - 36.0 g/dL   RDW 16.3 (H) 11.0 - 15.0 %   Platelets 117 (L) 140 - 400 Thousand/uL   MPV 10.7 7.5 - 12.5 fL  Basic metabolic panel  Result Value Ref Range   Glucose, Bld 86 65 - 99 mg/dL   BUN 23 7 - 25 mg/dL   Creat 1.17 (H) 0.50 - 0.99  mg/dL   BUN/Creatinine Ratio 20 6 - 22 (calc)   Sodium 138 135 - 146 mmol/L   Potassium 3.7 3.5 - 5.3 mmol/L   Chloride 99 98 - 110 mmol/L   CO2 23 20 - 32 mmol/L   Calcium 10.1 8.6 - 10.4 mg/dL  Ferritin  Result Value Ref Range   Ferritin 1,439 (H) 16 - 288 ng/mL  TSH  Result Value Ref Range   TSH 0.44 0.40 - 4.50 mIU/L   *Note: Due to a large number of results and/or encounters for the requested time period, some results have not been displayed. A complete set of results can be found in Results Review.    Your potassium continues to be normal!  Continue current medications  Thyroid is normal-continue current medication dose.  Let us recheck in 3 to 4 months  Your ferritin-iron-is actually elevated as opposed to low.  This is likely due to inflammation, and not true iron overload.  In any case, it does not appear that iron deficiency is the reason for your anemia  Your red cell counts are stable to slightly improved.  I would recommend repeating blood counts and ferritin in about a month to see where things go  At this point, our main follow-up concern today is your sacral ulcer.  I will continue to work on finding a home health care provider for you.  If we are not able to arrange this in the coming week, please see me in the office at your convenience for a wound check  Let me know if any questions, take care

## 2019-04-20 NOTE — Patient Instructions (Signed)
It was good to see you again today I will be in touch with your labs asap  I hope that we will be able to get home health to come and see you soon- there seems to be some problem with your insurance contract with home health

## 2019-04-21 LAB — CBC
HCT: 34.2 % — ABNORMAL LOW (ref 35.0–45.0)
Hemoglobin: 10.5 g/dL — ABNORMAL LOW (ref 11.7–15.5)
MCH: 24.8 pg — ABNORMAL LOW (ref 27.0–33.0)
MCHC: 30.7 g/dL — ABNORMAL LOW (ref 32.0–36.0)
MCV: 80.7 fL (ref 80.0–100.0)
MPV: 10.7 fL (ref 7.5–12.5)
Platelets: 117 10*3/uL — ABNORMAL LOW (ref 140–400)
RBC: 4.24 10*6/uL (ref 3.80–5.10)
RDW: 16.3 % — ABNORMAL HIGH (ref 11.0–15.0)
WBC: 8.2 10*3/uL (ref 3.8–10.8)

## 2019-04-21 LAB — FERRITIN: Ferritin: 1439 ng/mL — ABNORMAL HIGH (ref 16–288)

## 2019-04-21 LAB — BASIC METABOLIC PANEL
BUN/Creatinine Ratio: 20 (calc) (ref 6–22)
BUN: 23 mg/dL (ref 7–25)
CO2: 23 mmol/L (ref 20–32)
Calcium: 10.1 mg/dL (ref 8.6–10.4)
Chloride: 99 mmol/L (ref 98–110)
Creat: 1.17 mg/dL — ABNORMAL HIGH (ref 0.50–0.99)
Glucose, Bld: 86 mg/dL (ref 65–99)
Potassium: 3.7 mmol/L (ref 3.5–5.3)
Sodium: 138 mmol/L (ref 135–146)

## 2019-04-21 LAB — TSH: TSH: 0.44 mIU/L (ref 0.40–4.50)

## 2019-04-23 ENCOUNTER — Encounter: Payer: Self-pay | Admitting: Family Medicine

## 2019-04-26 ENCOUNTER — Encounter: Payer: Self-pay | Admitting: Family Medicine

## 2019-04-26 ENCOUNTER — Telehealth: Payer: Self-pay | Admitting: Family Medicine

## 2019-04-26 NOTE — Telephone Encounter (Signed)
Called [facility] to set up services for her - however they cannot see her due to staffing shortage

## 2019-04-26 NOTE — Telephone Encounter (Signed)
-----   Message from Darreld Mclean, MD sent at 04/20/2019  5:38 PM EST ----- Called [facility] -?  They might see her

## 2019-04-27 LAB — HEMOCCULT GUIAC POC 1CARD (OFFICE): Fecal Occult Blood, POC: NEGATIVE

## 2019-04-27 NOTE — Addendum Note (Signed)
Addended by: Wynonia Musty A on: 04/27/2019 07:05 AM   Modules accepted: Orders

## 2019-04-28 ENCOUNTER — Telehealth: Payer: Self-pay | Admitting: Family

## 2019-04-28 DIAGNOSIS — R269 Unspecified abnormalities of gait and mobility: Secondary | ICD-10-CM

## 2019-04-28 NOTE — Telephone Encounter (Signed)
See community message below. PT order placed. Patient notified via mychart.      Maurine Simmering, NP        put in an order for just therapy. fingers crossed that we will have a nurse free up and our therapist can order   Previous Messages  ----- Message -----  From: Debbrah Alar, NP  Sent: 04/28/2019  9:10 AM EST  To: Danny Lawless  Subject: RE: [facility]             She really needs both. Do you need me to put in another PT order or can you use the old one? Can you please keep her on your wait list for nursing as well?   Thanks,   Rubena Roseman  ----- Message -----  From: Danny Lawless  Sent: 04/28/2019  9:08 AM EST  To: Debbrah Alar, NP  Subject: [facility]               good morning  just wanted to touch base about [name].  is this patient needing therapy or nursing? if nursing we are still packed but if therapy we can. don't see a recent order

## 2019-05-02 ENCOUNTER — Telehealth: Payer: Self-pay | Admitting: Family

## 2019-05-02 NOTE — Telephone Encounter (Signed)
Maurine Simmering, NP        hello again  I sent referral in and they touched base with patient. patient was wanting to know when our nurse was going to come out. because of that it would be irresponsible for [place] to accept patient because then we would be responsible for care that we couldn't provide. I tried to help patient but it appears in this instance we cant do one without the other. sorry.    Previous Messages  ----- Message -----  From: Debbrah Alar, NP  Sent: 04/28/2019  9:34 AM EST  To: Danny Lawless  Subject: RE: [facility]             Referral is in. Thanks!  ----- Message -----  From: Danny Lawless  Sent: 04/28/2019  9:26 AM EST  To: Debbrah Alar, NP  Subject: RE: [facility]             put in an order for just therapy. fingers crossed that we will have a nurse free up and our therapist can order   ----- Message -----  From: Debbrah Alar, NP  Sent: 04/28/2019  9:10 AM EST  To: Danny Lawless  Subject: RE: [facility]             She really needs both. Do you need me to put in another PT order or can you use the old one? Can you please keep her on your wait list for nursing as well?   Thanks,   Eldra Word  ----- Message -----  From: Danny Lawless  Sent: 04/28/2019  9:08 AM EST  To: Debbrah Alar, NP  Subject: [facility]               good morning  just wanted to touch base about [facility].  is this patient needing therapy or nursing? if nursing we are still packed but if therapy we can. don't see a recent order

## 2019-05-09 ENCOUNTER — Ambulatory Visit: Payer: 59 | Admitting: Podiatry

## 2019-05-09 ENCOUNTER — Other Ambulatory Visit: Payer: Self-pay

## 2019-05-09 DIAGNOSIS — Z7901 Long term (current) use of anticoagulants: Secondary | ICD-10-CM

## 2019-05-09 DIAGNOSIS — M2030 Hallux varus (acquired), unspecified foot: Secondary | ICD-10-CM

## 2019-05-09 DIAGNOSIS — Q828 Other specified congenital malformations of skin: Secondary | ICD-10-CM

## 2019-05-11 ENCOUNTER — Other Ambulatory Visit: Payer: Self-pay | Admitting: Family

## 2019-05-12 ENCOUNTER — Encounter: Payer: Self-pay | Admitting: Family Medicine

## 2019-05-16 ENCOUNTER — Ambulatory Visit: Payer: 59 | Admitting: Family Medicine

## 2019-05-18 ENCOUNTER — Telehealth: Payer: Self-pay | Admitting: Family

## 2019-05-18 ENCOUNTER — Ambulatory Visit: Payer: 59 | Admitting: Family Medicine

## 2019-05-18 NOTE — Progress Notes (Signed)
Subjective: 63 year old female presents the office today for follow-up evaluation of wound on left big toe.  She states has been doing fine and is healed well if any problems.  She was to have the nails trimmed today as well as the calluses.  She wants to still hold off on surgery at this time.  She denies any new issues. Denies any systemic complaints such as fevers, chills, nausea, vomiting. No acute changes since last appointment, and no other complaints at this time.   Objective: AAO x3, NAD DP/PT pulses palpable bilaterally, CRT less than 3 seconds Hallux varus is present bilaterally left side worse than the right.  Right side seems to have progressed somewhat.  The nails appear to be dystrophic, discolored with yellow-brown discoloration.  No redness or drainage or any signs of infection.  Hyperkeratotic lesion submetatarsal area without any underlying ulceration drainage or signs of infection. No open lesions or pre-ulcerative lesions.  No pain with calf compression, swelling, warmth, erythema  Assessment: Hallux varus with symptomatic onychomycosis, hyperkeratotic lesions  Plan: -All treatment options discussed with the patient including all alternatives, risks, complications.  -Regards to hallux varus continue try to splint the toes.  She wants to hold off on surgery at this time. -Debrided the nails x10 without any complications or bleeding also debrided the hyperkeratotic lesions without any complications or bleeding.  Currently no signs of infection or ulcerations but continue to monitor closely. -Patient encouraged to call the office with any questions, concerns, change in symptoms.   Trula Slade DPM

## 2019-05-18 NOTE — Telephone Encounter (Signed)
Please contact pt to schedule follow up appointment with lab work.

## 2019-05-26 ENCOUNTER — Telehealth: Payer: Self-pay | Admitting: *Deleted

## 2019-05-26 MED ORDER — TRAMADOL HCL 50 MG PO TABS: ORAL_TABLET | ORAL | 0 refills | Status: DC

## 2019-05-26 NOTE — Telephone Encounter (Signed)
[name] requesting tramadol  Last written: 03/22/19 Last ov: 04/20/19 Next ov: 05/31/19 Contract: due UDS: due

## 2019-05-31 ENCOUNTER — Ambulatory Visit: Payer: 59 | Admitting: Family

## 2019-05-31 ENCOUNTER — Other Ambulatory Visit: Payer: Self-pay

## 2019-05-31 VITALS — BP 122/54 | HR 67 | Temp 96.0°F | Resp 16 | Ht 64.0 in | Wt 156.4 lb

## 2019-05-31 DIAGNOSIS — L89152 Pressure ulcer of sacral region, stage 2: Secondary | ICD-10-CM | POA: Diagnosis not present

## 2019-05-31 DIAGNOSIS — R5381 Other malaise: Secondary | ICD-10-CM

## 2019-05-31 DIAGNOSIS — E559 Vitamin D deficiency, unspecified: Secondary | ICD-10-CM | POA: Diagnosis not present

## 2019-05-31 DIAGNOSIS — F32A Depression, unspecified: Secondary | ICD-10-CM

## 2019-05-31 DIAGNOSIS — F329 Major depressive disorder, single episode, unspecified: Secondary | ICD-10-CM

## 2019-05-31 DIAGNOSIS — E876 Hypokalemia: Secondary | ICD-10-CM | POA: Diagnosis not present

## 2019-05-31 NOTE — Patient Instructions (Signed)
Please complete lab work prior to leaving. You must get off your bottom to ensure healing. Sleep in the bed and change positions frequently. Purchase a gel pad for your work chair.

## 2019-05-31 NOTE — Progress Notes (Signed)
Subjective:    Patient ID: [name], female    DOB: June 08, 1955, 64 y.o.   MRN: [id number]  HPI  Patient is a 64 yr old female who presents today for follow up. She is accompanied by her niece today.   Hypokalemia- previously hctz was changed to aldactone. Overdue for follow up lab work.   Sacral decubitous ulcer-  We have been unable to find any home health company to agree to go to her home to provide home health services.  She is not driving currently.  States that her sister-in-law is coming every few days to change her dressing.   Upon further questioning, niece tells me that patient sits in her recliner all day to do her work and also sleeps in the recliner. She had not previously let me know this.    Pt states that she is very fearful of falling.  Her last fall occurred in the kitchen and she is afraid to return to the kitchen. She is not cooking or preparing any of her own food.  Her family has been bringing her meals and checking on her twice a day.      Review of Systems    see HPI   Past Medical History:  Diagnosis Date  . Abnormal Pap smear    years ago/no biopsy  . Allergic rhinitis   . Anemia, iron deficiency    iron infusion scheduled for 04/02/2018  . Aortic atherosclerosis ([place])   . Arthritis    back- lower  . Asthma   . Atrial fibrillation ([place]) August 2012   OFF XARELTO LAST MONTH DUE TO BLEEDING IN STOOL  . Bursitis of hip left  . Chronic diastolic (congestive) heart failure (HCC)    pt unaware of this  . COPD (chronic obstructive pulmonary disease) with chronic bronchitis ([place])   . Decreased pulses in feet   . Dysrhythmia    afib, followed by Dr. Stanford Breed   . Edema of both legs   . Esophagitis 02/02/2014   Distal, linear erosions, noted on endoscopy  . Fatty liver 01/07/2012  . Fibroid 1974   fibroid cyst on left fallopian tube  . Fibromyalgia   . Foot ulcer ([place])    AREA HEALED RIGHT FOOT  . GERD (gastroesophageal reflux disease)   .  Headache(784.0)    occasional sinus headache   . Hepatomegaly   . History of blood transfusion JULY 2015  . History of cardiomegaly 12/06/2010   Noted on CT  . History of E. coli septicemia   . Hypertension   . Hypoparathyroidism ([place]) 01/07/2011  . Hypothyroidism   . Kidney stone Oct 15, 2015   passed on their own  . Morbid obesity ([place])   . MRSA infection   . Nephrolithiasis 2/16, 9/16   SEES DR Risa Grill  . Pernicious anemia 02/20/2014   followed by Debbrah Alar  . Pneumonia   . PONV (postoperative nausea and vomiting)   . Pyelonephritis 11/24/2018  . Rash    thighs and back  . Seizures ([place])    infancy secondary to fever  . Staph aureus infection   . Thoracic spondylosis 12/06/2010   Noted on CT  . Thyroid cancer ([place]) 2011   THYROIDECTOMY DONE  . Uterine cancer ([place]) 2014   Mirena IUD  . Vitamin B 12 deficiency   . Vitamin D deficiency      Social History   Socioeconomic History  . Marital status: Single    Spouse name: Not on file  .  Number of children: 0  . Years of education: Not on file  . Highest education level: Not on file  Occupational History  . Occupation: works in Insurance claims handler  . Occupation: DESIGN COMPUTER CHIP    Employer: [employer]  Tobacco Use  . Smoking status: Former Smoker    Packs/day: 0.50    Years: 25.00    Pack years: 12.50    Types: Cigarettes    Start date: 12/24/1970    Quit date: 05/27/1995    Years since quitting: 24.0  . Smokeless tobacco: Never Used  . Tobacco comment: quit smoking 19 years ago  Substance and Sexual Activity  . Alcohol use: Yes    Alcohol/week: 0.0 standard drinks    Comment: 1/2 glass per month  . Drug use: No  . Sexual activity: Yes    Partners: Male    Birth control/protection: Post-menopausal  Other Topics Concern  . Not on file  Social History Narrative   Occupation: works in Insurance claims handler - Field seismologist   Single       Former Smoker - quit tobacco 12 years ago.  She was  light smoker for 10 years.                Social Determinants of Health   Financial Resource Strain:   . Difficulty of Paying Living Expenses: Not on file  Food Insecurity:   . Worried About Charity fundraiser in the Last Year: Not on file  . Ran Out of Food in the Last Year: Not on file  Transportation Needs:   . Lack of Transportation (Medical): Not on file  . Lack of Transportation (Non-Medical): Not on file  Physical Activity:   . Days of Exercise per Week: Not on file  . Minutes of Exercise per Session: Not on file  Stress:   . Feeling of Stress : Not on file  Social Connections:   . Frequency of Communication with Friends and Family: Not on file  . Frequency of Social Gatherings with Friends and Family: Not on file  . Attends Religious Services: Not on file  . Active Member of Clubs or Organizations: Not on file  . Attends Archivist Meetings: Not on file  . Marital Status: Not on file  Intimate Partner Violence:   . Fear of Current or Ex-Partner: Not on file  . Emotionally Abused: Not on file  . Physically Abused: Not on file  . Sexually Abused: Not on file    Past Surgical History:  Procedure Laterality Date  . AMPUTATION Right 05/18/2014   Procedure: RIGHT FIFTH RAY AMPUTATION FOOT;  Surgeon: Wylene Simmer, MD;  Location: [facility];  Service: Orthopedics;  Laterality: Right;  . APPENDECTOMY    . BUNIONECTOMY     bilateral  . COLONOSCOPY WITH PROPOFOL N/A 02/02/2014   Procedure: COLONOSCOPY WITH PROPOFOL;  Surgeon: Irene Shipper, MD;  [facility];  Service: Endoscopy;  Laterality: N/A;  . cyst on ovary removed     . CYSTOSCOPY W/ RETROGRADES  03/03/2012   Procedure: CYSTOSCOPY WITH RETROGRADE PYELOGRAM;  Surgeon: Bernestine Amass, MD;  [facility];  Service: Urology;  Laterality: Bilateral;  . CYSTOSCOPY W/ URETERAL STENT PLACEMENT Right 11/25/2017   Procedure: CYSTOSCOPY WITH RETROGRADE RIGHT URETERAL STENT PLACEMENT;  Surgeon: Franchot Gallo,  MD;  Location: [facility];  Service: Urology;  Laterality: Right;  . CYSTOSCOPY W/ URETERAL STENT PLACEMENT Right 01/15/2018   Procedure: RIGHT STENT EXCHANGE;  Surgeon: Louis Meckel,  Viona Gilmore, MD;  [facility];  Service: Urology;  Laterality: Right;  . CYSTOSCOPY WITH RETROGRADE PYELOGRAM, URETEROSCOPY AND STENT PLACEMENT Left 08/25/2012   Procedure: CYSTOSCOPY WITH RETROGRADE PYELOGRAM, URETEROSCOPY;  Surgeon: Bernestine Amass, MD;  [facility];  Service: Urology;  Laterality: Left;  . CYSTOSCOPY WITH STENT PLACEMENT Left 01/15/2018   Procedure: LEFT STENT PLACEMENT;  Surgeon: Ardis Hughs, MD;  [facility];  Service: Urology;  Laterality: Left;  . CYSTOSCOPY WITH STENT PLACEMENT Right 01/22/2018   Procedure: RIGHT STENT REMOVAL;  Surgeon: Ardis Hughs, MD;  [facility];  Service: Urology;  Laterality: Right;  . CYSTOSCOPY/URETEROSCOPY/HOLMIUM LASER/STENT PLACEMENT Right 12/23/2017   Procedure: RIGHT URETEROSCOPY STONE REMOVAL, HOLMIUM LASER, RIGHT /STENT EXCHANGE;  Surgeon: Ardis Hughs, MD;  [facility];  Service: Urology;  Laterality: Right;  . CYSTOSCOPY/URETEROSCOPY/HOLMIUM LASER/STENT PLACEMENT Left 01/22/2018   Procedure: LEFT URETEROSCOPY STONE REMOVAL HOLMIUM LASER LEFT STENT Freddi Starr;  Surgeon: Ardis Hughs, MD;  [facility];  Service: Urology;  Laterality: Left;  . DILATION AND CURETTAGE OF UTERUS N/A 02/21/2013   Procedure: DILATATION AND CURETTAGE;  Surgeon: Lyman Speller, MD;  Location: [facility];  Service: Gynecology;  Laterality: N/A;  . DILATION AND CURETTAGE OF UTERUS N/A 04/09/2015   Procedure: [place] IUD removal;  Surgeon: Megan Salon, MD;  Location: [facility];  Service: Gynecology;  Laterality: N/A;  Patient weight 307lbs  . ESOPHAGOGASTRODUODENOSCOPY (EGD) WITH PROPOFOL N/A 02/02/2014   Procedure: ESOPHAGOGASTRODUODENOSCOPY (EGD) WITH PROPOFOL;  Surgeon: Irene Shipper, MD;  [facility];  Service: Endoscopy;  Laterality: N/A;  . GASTRIC BYPASS  1974  . HOLMIUM LASER APPLICATION Left 0000000   Procedure: HOLMIUM LASER APPLICATION;  Surgeon: Bernestine Amass, MD;  [facility];  Service: Urology;  Laterality: Left;  . LITHOTRIPSY  03/2012  . LITHOTRIPSY  2/16  . THYROIDECTOMY  05/15/2010  . TONSILLECTOMY AND ADENOIDECTOMY    . URETEROSCOPY  03/03/2012   Procedure: URETEROSCOPY;  Surgeon: Bernestine Amass, MD;  [facility];  Service: Urology;  Laterality: Left;  . URETEROSCOPY WITH HOLMIUM LASER LITHOTRIPSY Bilateral 01/15/2018   Procedure: CYSTOSCOPY/BILATERAL URETEROSCOPY WITH HOLMIUM LASER LITHOTRIPSY STONE REMOVAL;  Surgeon: Ardis Hughs, MD;  [facility];  Service: Urology;  Laterality: Bilateral;    Family History  Problem Relation Age of Onset  . Hypertension Father   . Diabetes Father   . Lung cancer Father   . Heart attack Father        MI at age 69  . Hypertension Mother   . Hyperthyroidism Mother   . Heart disease Mother   . Heart attack Mother        MI at age 51  . Asthma Brother   . Hypertension Brother        younger  . Heart disease Brother        older  . Colon cancer Neg Hx   . Esophageal cancer Neg Hx   . Stomach cancer Neg Hx   . Kidney disease Neg Hx   . Liver disease Neg Hx     Allergies  Allergen Reactions  . Other Anaphylaxis    Reaction to tree nuts  . Amoxicillin-Pot Clavulanate Other (See Comments)    headache  . Ciprofloxacin Nausea Only and Rash  . Food Hives and Other (See Comments)    Potato  . Keflex [Cephalexin] Rash  . Penicillins Rash    Headache. And  hives - Dose not tolerate Cephalosporin either   Has patient had a PCN reaction causing immediate rash, facial/tongue/throat swelling, SOB or lightheadedness with hypotension: No Has patient had a PCN reaction causing severe rash involving mucus membranes or skin necrosis: No Has patient had a PCN reaction that required hospitalization:  No Has patient had a PCN reaction occurring within the last 10 years: Yes (reaction was May 2020) If all of the above answers are "NO", then may proce  . Rocephin [Ceftriaxone] Rash  . Tomato Hives    Current Outpatient Medications on File Prior to Visit  Medication Sig Dispense Refill  . diltiazem (CARDIZEM CD) 120 MG 24 hr capsule Take 1 capsule (120 mg total) by mouth daily. 90 capsule 1  . acetaminophen (TYLENOL) 325 MG tablet Take 2 tablets (650 mg total) by mouth every 6 (six) hours as needed for mild pain or moderate pain. 60 tablet 0  . albuterol (PROVENTIL HFA;VENTOLIN HFA) 108 (90 Base) MCG/ACT inhaler Inhale 2 puffs into the lungs every 6 (six) hours as needed for wheezing or shortness of breath. 1 Inhaler 5  . albuterol (PROVENTIL) (2.5 MG/3ML) 0.083% nebulizer solution Take 3 mLs (2.5 mg total) by nebulization every 6 (six) hours as needed for wheezing or shortness of breath. 150 mL 1  . calcitRIOL (ROCALTROL) 0.5 MCG capsule Take 2 mcg by mouth daily.     . calcium elemental as carbonate (TUMS ULTRA 1000) 400 MG chewable tablet Chew 2,000 mg by mouth 2 (two) times daily.    . citric acid-potassium citrate (POLYCITRA) 1100-334 MG/5ML solution     . cyanocobalamin (,VITAMIN B-12,) 1000 MCG/ML injection Inject 1 mL (1,000 mcg total) into the skin every 30 (thirty) days. 3 mL 4  . desoximetasone (TOPICORT) 0.05 % cream Apply topically 2 (two) times daily as needed. 60 g 0  . diclofenac sodium (VOLTAREN) 1 % GEL Apply 2 g topically 4 (four) times daily. 100 g 3  . diphenhydrAMINE (BENADRYL) 25 mg capsule Take 25 mg by mouth every 6 (six) hours as needed for itching.     [name] ELIQUIS 5 MG TABS tablet TAKE 1 TABLET BY MOUTH TWO  TIMES DAILY (Patient taking differently: Take 5 mg by mouth 2 (two) times daily. ) 180 tablet 1  . EPINEPHrine (EPIPEN 2-PAK) 0.3 mg/0.3 mL IJ SOAJ injection Inject 0.3 mg into the muscle once as needed for anaphylaxis (severe allergic reaction).     . furosemide  (LASIX) 40 MG tablet TAKE 2 TABLETS BY MOUTH  DAILY IN THE MORNING AND 1  TABLET IN THE EVENING 270 tablet 3  . hydrocortisone cream 1 % Apply topically 4 (four) times daily as needed for itching. 30 g 0  . levocetirizine (XYZAL) 5 MG tablet TAKE 1 TABLET BY MOUTH  EVERY EVENING 90 tablet 3  . levothyroxine (SYNTHROID, LEVOTHROID) 100 MCG tablet Take 400 mcg by mouth daily before breakfast.     . Melatonin 5 MG TABS Take 5 mg by mouth at bedtime as needed (for sleep).     . montelukast (SINGULAIR) 10 MG tablet Take 1 tablet (10 mg total) by mouth at bedtime. 30 tablet 3  . nystatin (MYCOSTATIN/NYSTOP) powder Apply topically 2 (two) times daily. 45 g 1  . nystatin cream (MYCOSTATIN) Apply 1 application topically 2 (two) times daily. 30 g 2  . omeprazole (PRILOSEC) 40 MG capsule TAKE 1 CAPSULE BY MOUTH  DAILY 90 capsule 1  . ondansetron (ZOFRAN) 4 MG tablet Take 1 tablet (4  mg total) by mouth every 8 (eight) hours as needed for nausea or vomiting. 30 tablet 0  . potassium chloride 20 MEQ/15ML (10%) SOLN Take 20 mL by mouth 3 times daily 450 mL 5  . senna (SENOKOT) 8.6 MG TABS tablet Take 1 tablet (8.6 mg total) by mouth 2 (two) times daily. 120 tablet 0  . spironolactone (ALDACTONE) 25 MG tablet Take 1 tablet (25 mg total) by mouth daily. 30 tablet 2  . SYMBICORT 160-4.5 MCG/ACT inhaler INHALE 2 PUFFS BY MOUTH TWO TIMES DAILY 30.6 g 1  . Syringe/Needle, Disp, (SYRINGE 3CC/20GX1") 20G X 1" 3 ML MISC Use monthly as directed 50 each 0  . traMADol (ULTRAM) 50 MG tablet TAKE 1 TABLET(50 MG) BY MOUTH EVERY 12 HOURS AS NEEDED 30 tablet 0  . trimethoprim (TRIMPEX) 100 MG tablet Take 100 mg by mouth daily.    . Vitamin D, Ergocalciferol, (DRISDOL) 1.25 MG (50000 UT) CAPS capsule TAKE 1 CAPSULE BY MOUTH  EVERY MONDAY, WEDNESDAY,  AND FRIDAY. 39 capsule 1  . zafirlukast (ACCOLATE) 20 MG tablet TAKE 1 TABLET BY MOUTH  TWICE DAILY BEFORE MEALS 180 tablet 3   No current facility-administered medications on file  prior to visit.    BP (!) 122/54 (BP Location: Right Arm, Patient Position: Sitting, Cuff Size: Large) Comment: RIGHT ARM  Pulse 67   Temp (!) 96 F (35.6 C) (Temporal)   Resp 16   Ht 5\' 4"  (1.626 m)   Wt 156 lb 6.4 oz (70.9 kg)   LMP 06/11/2012   SpO2 98%   BMI 26.85 kg/m    Objective:   Physical Exam Constitutional:      Appearance: She is well-developed.  Neck:     Thyroid: No thyromegaly.  Cardiovascular:     Rate and Rhythm: Normal rate and regular rhythm.     Heart sounds: Normal heart sounds. No murmur.  Pulmonary:     Effort: Pulmonary effort is normal. No respiratory distress.     Breath sounds: Normal breath sounds. No wheezing.  Musculoskeletal:     Cervical back: Neck supple.  Skin:    General: Skin is warm and dry.     Comments: Stage II ulcer noted on right buttock surrounded by a larger state I ulcer  Large stage I ulcer left buttock  Neurological:     Mental Status: She is alert and oriented to person, place, and time.  Psychiatric:        Behavior: Behavior normal.        Thought Content: Thought content normal.        Judgment: Judgment normal.            Assessment & Plan:  Sacral decub ulcer- Stage I and II- skin cleansed and redressed with dudoderm.  I gave pt and her niece handout with example of gel pad for her chair that they can purchase from [employer].  She is currently sitting on a pillow.  We also discussed the importance of getting off of her bottom to allow for wound healing and prevent further skin breakdown.  She is advised to lay on bed when she is not working at night with frequent turning from side to side. She does not have a couch.    Hypokalemia- follow up K+ is WNL with the addition of Aldactone.  Hypercalcemia- pt has hx of hypocalcemia and is maintained on calcitriol.  She is not currently taking a calcium supplement. I have asked her to hold calcitriol and  have left a message for her endocrinologist and forwarded the results  to him as well updating him about this change.  Plan to repeat calcium on Monday 1/11.  Debilitation- unfortunately she is having increased mobility and balance issues.  She is not driving and cannot do outpt PT.  Family members work during the day.  Home health will not come to her home to do home PT.  I think that there is a contract issue with [employer] and the home care agencies which is discouraging them from taking her on as a patient. I will see if we can find an outpatient PT that has evening hours so her family members could bring her after work.   Depression-    Office Visit from 05/31/2019 in [facility] at [facility]  PHQ-9 Total Score  10     We discussed her above score.  She declines treatment for this and states that it is "related to my job." Apparently there is some concern about her productivity at work.

## 2019-06-01 ENCOUNTER — Other Ambulatory Visit: Payer: Self-pay | Admitting: Family

## 2019-06-01 ENCOUNTER — Telehealth: Payer: Self-pay | Admitting: Family

## 2019-06-01 DIAGNOSIS — D649 Anemia, unspecified: Secondary | ICD-10-CM

## 2019-06-01 LAB — BASIC METABOLIC PANEL
BUN: 40 mg/dL — ABNORMAL HIGH (ref 6–23)
CO2: 26 mEq/L (ref 19–32)
Calcium: 12.5 mg/dL — ABNORMAL HIGH (ref 8.4–10.5)
Chloride: 99 mEq/L (ref 96–112)
Creatinine, Ser: 1.48 mg/dL — ABNORMAL HIGH (ref 0.40–1.20)
GFR: 35.73 mL/min — ABNORMAL LOW (ref >60.00)
Glucose, Bld: 92 mg/dL (ref 70–99)
Potassium: 4 mEq/L (ref 3.5–5.1)
Sodium: 136 mEq/L (ref 135–145)

## 2019-06-01 NOTE — Telephone Encounter (Signed)
Left message for Dr. Cindra Eves CMA to notify Dr. Buddy Duty of pt's calcium.  I have advised the pt to hold calcitriol and return to the lab on Monday for repeat calcium level.  She would like cbc checked as well.   Steffanie Dunn- can you please call the pt to schedule a lab appointment on Monday?

## 2019-06-02 ENCOUNTER — Telehealth: Payer: Self-pay | Admitting: Family

## 2019-06-02 NOTE — Telephone Encounter (Signed)
Could you please call around to some local outpt Physical therapy offices to see if anyone has evening hours?

## 2019-06-02 NOTE — Telephone Encounter (Signed)
Will try [facility] phone # [phone] [place] location fax # [phone]

## 2019-06-03 NOTE — Telephone Encounter (Signed)
Referral faxed to [facility] with insurance and demographic information

## 2019-06-04 LAB — VITAMIN D 1,25 DIHYDROXY
Vitamin D 1, 25 (OH)2 Total: 85 pg/mL — ABNORMAL HIGH (ref 18–72)
Vitamin D2 1, 25 (OH)2: 9 pg/mL
Vitamin D3 1, 25 (OH)2: 76 pg/mL

## 2019-06-06 ENCOUNTER — Telehealth: Payer: Self-pay | Admitting: Family

## 2019-06-06 ENCOUNTER — Other Ambulatory Visit (INDEPENDENT_AMBULATORY_CARE_PROVIDER_SITE_OTHER): Payer: 59

## 2019-06-06 ENCOUNTER — Other Ambulatory Visit: Payer: Self-pay

## 2019-06-06 ENCOUNTER — Encounter: Payer: Self-pay | Admitting: Family

## 2019-06-06 DIAGNOSIS — D649 Anemia, unspecified: Secondary | ICD-10-CM | POA: Diagnosis not present

## 2019-06-06 NOTE — Telephone Encounter (Signed)
Please ask pt if she is taking a vitamin D supplement. If so, please discontinue as her vit D level is high.

## 2019-06-06 NOTE — Telephone Encounter (Signed)
Patient reports she is taking vitamin d, advised patient her levels came back elevated and per Melissa to dc vitamin d supplements at this time. She verbalized understanding.

## 2019-06-07 LAB — IRON: Iron: 54 ug/dL (ref 42–145)

## 2019-06-07 LAB — CBC WITH DIFFERENTIAL/PLATELET
Basophils Absolute: 0.1 10*3/uL (ref 0.0–0.1)
Basophils Relative: 1 % (ref 0.0–3.0)
Eosinophils Absolute: 0.1 10*3/uL (ref 0.0–0.7)
Eosinophils Relative: 1.1 % (ref 0.0–5.0)
HCT: 34.6 % — ABNORMAL LOW (ref 36.0–46.0)
Hemoglobin: 10.9 g/dL — ABNORMAL LOW (ref 12.0–15.0)
Lymphocytes Relative: 10 % — ABNORMAL LOW (ref 12.0–46.0)
Lymphs Abs: 0.7 10*3/uL (ref 0.7–4.0)
MCHC: 31.5 g/dL (ref 30.0–36.0)
MCV: 80.7 fl (ref 78.0–100.0)
Monocytes Absolute: 0.3 10*3/uL (ref 0.1–1.0)
Monocytes Relative: 3.9 % (ref 3.0–12.0)
Neutro Abs: 6.2 10*3/uL (ref 1.4–7.7)
Neutrophils Relative %: 84 % — ABNORMAL HIGH (ref 43.0–77.0)
Platelets: 189 10*3/uL (ref 150.0–400.0)
RBC: 4.29 Mil/uL (ref 3.87–5.11)
RDW: 19 % — ABNORMAL HIGH (ref 11.5–15.5)
WBC: 7.4 10*3/uL (ref 4.0–10.5)

## 2019-06-07 LAB — VITAMIN B12: Vitamin B-12: 657 pg/mL (ref 211–911)

## 2019-06-07 LAB — CALCIUM: Calcium: 10.4 mg/dL (ref 8.4–10.5)

## 2019-06-07 MED ORDER — ZAFIRLUKAST 20 MG PO TABS: 20.0000 mg | ORAL_TABLET | Freq: Two times a day (BID) | ORAL | 0 refills | Status: DC

## 2019-06-08 ENCOUNTER — Encounter: Payer: Self-pay | Admitting: Family Medicine

## 2019-06-10 ENCOUNTER — Telehealth: Payer: Self-pay

## 2019-06-10 ENCOUNTER — Encounter: Payer: Self-pay | Admitting: Neurology

## 2019-06-10 ENCOUNTER — Ambulatory Visit: Payer: 59 | Admitting: Neurology

## 2019-06-10 ENCOUNTER — Other Ambulatory Visit: Payer: Self-pay

## 2019-06-10 VITALS — BP 124/76 | HR 65 | Ht 64.0 in | Wt 243.0 lb

## 2019-06-10 DIAGNOSIS — R292 Abnormal reflex: Secondary | ICD-10-CM | POA: Diagnosis not present

## 2019-06-10 DIAGNOSIS — R413 Other amnesia: Secondary | ICD-10-CM | POA: Diagnosis not present

## 2019-06-10 NOTE — Telephone Encounter (Signed)
Per patient she does not want to try outpatient pt, she will only be willing to do home health pt.  Advised patient we have tried all of the home health agencies that [place] [id number] area and none of them were able to see her. We have been trying for months and they are still not available to provide care.  [facility] (outpatient) was ready to see her. Patient advised if she changes her mind about outpatient pt to let us know and we will be glad to refer her again.    Copied from [place] [id number]. Topic: Referral - Question >> Jun 07, 2019  9:54 AM Lennox Solders wrote: Reason for [id number] with [facility] is calling to let the office know the pt was schedule for PT session today and was called patient thought [facility] was coming to her house. Pt  thought she was having  home health >> Jun 07, 2019 10:08 AM Margot Ables wrote: Please advise

## 2019-06-10 NOTE — Progress Notes (Signed)
NEUROLOGY CONSULTATION NOTE  [name] MRN: [id number] DOB: 01/12/1956  Referring provider: Dr. [name] Primary care provider: Debbrah Alar, NP  Reason for consult:  confusion  Dear Dr Lorelei Pont:  Thank you for your kind referral of [name] for consultation of the above symptoms. Although her history is well known to you, please allow me to reiterate it for the purpose of our medical record. The patient was accompanied to the clinic by her brother [name] who also provides collateral information. Records and images were personally reviewed where available.   HISTORY OF PRESENT ILLNESS: This is a pleasant 64 year old right-handed woman with a history of atrial fibrillation, COPD, hyperparathyroidism, sacral decubitus ulcer, nephrolithiasis, fibromyalgia, presenting for evaluation of memory loss. She feels she cannot communicate very well, with word-finding difficulties. She feels this started after one of her hospitalizations for nephrolithiasis in July 2020. Her brother [name] started noticing changes 3-4 years ago, each time she would have kidney surgery, he noticed a change in cognition. Her niece feels it was even before this. He has noticed forgetfulness, slowed responses. She would forget something she just said, also depending on how tired she is. She lives alone. She denies missing medications, but [name] reports that for the past 2 weeks, different doctors have been changing her medications so she could not remember what she was not supposed to take. All her medications are in a basket, her brother feels it could be more organized. 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 She started having frequent falls a year ago and started working remotely, then [employer] hit. She has noticed she is making more mistakes than usual with her work, not performing like before. Her co-workers started noticing 6 months ago. They report that  3-4 months ago she was getting very angry, stating it takes a lot for her to compartmentalize and not worry. [name] noted that all of a sudden, she returned to her normal personality, he feels it was because of a more positive performance review she got from work this week. She stopped driving 2 years ago. She used to cook all the time, she last cooked Thanksgiving dinner in 2019, but now gives instructions. She denies misplacing things frequently.   She started having frequent falls 3-4 years ago. She denies any neck/back pain. She has tingling in her fingers, numbness up to her knees for many years. She has burning in the soles of her feet. She bought a wheelchair a year ago due to frequent falls/lack of balance. She has been hoping to get PT but it has not happened. Her brother notes she is more cautious, her confidence is not there. She has headaches trying to read small print. She has sinus headaches regularly. No dizziness, diplopia, dysarthria/dysphagia, bowel/bladder dysfunction, anosmia, or tremors. She usually gest 8-9 hours of refreshing sleep but lately has been drowsy during the day. There are 2 paternal aunts with dementia. She denies any head injuries, but they do not know what happened the last time she fell, she was on the floor for 11 hours before her brother found her. She does not drink alcohol.   Laboratory Data:  Lab Results  Component Value Date   TSH 0.44 04/20/2019   Lab Results  Component Value Date   [id number] 06/06/2019     PAST MEDICAL HISTORY: Past Medical History:  Diagnosis Date  . Abnormal Pap smear    years ago/no biopsy  .  Allergic rhinitis   . Anemia, iron deficiency    iron infusion scheduled for 04/02/2018  . Aortic atherosclerosis ([place])   . Arthritis    back- lower  . Asthma   . Atrial fibrillation ([place]) August 2012   OFF XARELTO LAST MONTH DUE TO BLEEDING IN STOOL  . Bursitis of hip left  . Chronic diastolic (congestive) heart failure (H[facility])     pt unaware of this  . COPD (chronic obstructive pulmonary disease) with chronic bronchitis ([place])   . Decreased pulses in feet   . Dysrhythmia    afib, followed by Dr. Stanford Breed   . Edema of both legs   . Esophagitis 02/02/2014   Distal, linear erosions, noted on endoscopy  . Fatty liver 01/07/2012  . Fibroid 1974   fibroid cyst on left fallopian tube  . Fibromyalgia   . Foot ulcer ([place])    AREA HEALED RIGHT FOOT  . GERD (gastroesophageal reflux disease)   . Headache(784.0)    occasional sinus headache   . Hepatomegaly   . History of blood transfusion JULY 2015  . History of cardiomegaly 12/06/2010   Noted on CT  . History of E. coli septicemia   . Hypertension   . Hypoparathyroidism ([place]) 01/07/2011  . Hypothyroidism   . Kidney stone September 22, 2015   passed on their own  . Morbid obesity ([place])   . MRSA infection   . Nephrolithiasis 2/16, 9/16   SEES DR Risa Grill  . Pernicious anemia 02/20/2014   followed by Debbrah Alar  . Pneumonia   . PONV (postoperative nausea and vomiting)   . Pyelonephritis 11/24/2018  . Rash    thighs and back  . Seizures ([place])    infancy secondary to fever  . Staph aureus infection   . Thoracic spondylosis 12/06/2010   Noted on CT  . Thyroid cancer ([place]) 2011   THYROIDECTOMY DONE  . Uterine cancer ([place]) 2014   Mirena IUD  . Vitamin B 12 deficiency   . Vitamin D deficiency     PAST SURGICAL HISTORY: Past Surgical History:  Procedure Laterality Date  . AMPUTATION Right 05/18/2014   Procedure: RIGHT FIFTH RAY AMPUTATION FOOT;  Surgeon: Wylene Simmer, MD;  Location: [facility];  Service: Orthopedics;  Laterality: Right;  . APPENDECTOMY    . BUNIONECTOMY     bilateral  . COLONOSCOPY WITH PROPOFOL N/A 02/02/2014   Procedure: COLONOSCOPY WITH PROPOFOL;  Surgeon: Irene Shipper, MD;  Location: [facility];  Service: Endoscopy;  Laterality: N/A;  . cyst on ovary removed     . CYSTOSCOPY W/ RETROGRADES  03/03/2012   Procedure: CYSTOSCOPY WITH RETROGRADE  PYELOGRAM;  Surgeon: Bernestine Amass, MD;  Location: [facility];  Service: Urology;  Laterality: Bilateral;  . CYSTOSCOPY W/ URETERAL STENT PLACEMENT Right 11/25/2017   Procedure: CYSTOSCOPY WITH RETROGRADE RIGHT URETERAL STENT PLACEMENT;  Surgeon: Franchot Gallo, MD;  Location: [facility];  Service: Urology;  Laterality: Right;  . CYSTOSCOPY W/ URETERAL STENT PLACEMENT Right 01/15/2018   Procedure: RIGHT STENT EXCHANGE;  Surgeon: Ardis Hughs, MD;  [facility];  Service: Urology;  Laterality: Right;  . CYSTOSCOPY WITH RETROGRADE PYELOGRAM, URETEROSCOPY AND STENT PLACEMENT Left 08/25/2012   Procedure: CYSTOSCOPY WITH RETROGRADE PYELOGRAM, URETEROSCOPY;  Surgeon: Bernestine Amass, MD;  [facility];  Service: Urology;  Laterality: Left;  . CYSTOSCOPY WITH STENT PLACEMENT Left 01/15/2018   Procedure: LEFT STENT PLACEMENT;  Surgeon: Ardis Hughs, MD;  [facility];  Service: Urology;  Laterality: Left;  .  CYSTOSCOPY WITH STENT PLACEMENT Right 01/22/2018   Procedure: RIGHT STENT REMOVAL;  Surgeon: Ardis Hughs, MD;  [facility];  Service: Urology;  Laterality: Right;  . CYSTOSCOPY/URETEROSCOPY/HOLMIUM LASER/STENT PLACEMENT Right 12/23/2017   Procedure: RIGHT URETEROSCOPY STONE REMOVAL, HOLMIUM LASER, RIGHT /STENT EXCHANGE;  Surgeon: Ardis Hughs, MD;  [facility];  Service: Urology;  Laterality: Right;  . CYSTOSCOPY/URETEROSCOPY/HOLMIUM LASER/STENT PLACEMENT Left 01/22/2018   Procedure: LEFT URETEROSCOPY STONE REMOVAL HOLMIUM LASER LEFT STENT Freddi Starr;  Surgeon: Ardis Hughs, MD;  [facility];  Service: Urology;  Laterality: Left;  . DILATION AND CURETTAGE OF UTERUS N/A 02/21/2013   Procedure: DILATATION AND CURETTAGE;  Surgeon: Lyman Speller, MD;  Location: [facility];  Service: Gynecology;  Laterality: N/A;  . DILATION AND CURETTAGE OF UTERUS N/A 04/09/2015   Procedure: [place] IUD removal;  Surgeon: Megan Salon, MD;  Location: [facility];  Service: Gynecology;  Laterality: N/A;  Patient weight 307lbs  . ESOPHAGOGASTRODUODENOSCOPY (EGD) WITH PROPOFOL N/A 02/02/2014   Procedure: ESOPHAGOGASTRODUODENOSCOPY (EGD) WITH PROPOFOL;  Surgeon: Irene Shipper, MD;  [facility];  Service: Endoscopy;  Laterality: N/A;  . GASTRIC BYPASS  1974  . HOLMIUM LASER APPLICATION Left 0000000   Procedure: HOLMIUM LASER APPLICATION;  Surgeon: Bernestine Amass, MD;  [facility];  Service: Urology;  Laterality: Left;  . LITHOTRIPSY  03/2012  . LITHOTRIPSY  2/16  . THYROIDECTOMY  05/15/2010  . TONSILLECTOMY AND ADENOIDECTOMY    . URETEROSCOPY  03/03/2012   Procedure: URETEROSCOPY;  Surgeon: Bernestine Amass, MD;  [facility];  Service: Urology;  Laterality: Left;  . URETEROSCOPY WITH HOLMIUM LASER LITHOTRIPSY Bilateral 01/15/2018   Procedure: CYSTOSCOPY/BILATERAL URETEROSCOPY WITH HOLMIUM LASER LITHOTRIPSY STONE REMOVAL;  Surgeon: Ardis Hughs, MD;  [facility];  Service: Urology;  Laterality: Bilateral;    MEDICATIONS: Current Outpatient Medications on File Prior to Visit  Medication Sig Dispense Refill  . acetaminophen (TYLENOL) 325 MG tablet Take 2 tablets (650 mg total) by mouth every 6 (six) hours as needed for mild pain or moderate pain. 60 tablet 0  . albuterol (PROVENTIL HFA;VENTOLIN HFA) 108 (90 Base) MCG/ACT inhaler Inhale 2 puffs into the lungs every 6 (six) hours as needed for wheezing or shortness of breath. 1 Inhaler 5  . albuterol (PROVENTIL) (2.5 MG/3ML) 0.083% nebulizer solution Take 3 mLs (2.5 mg total) by nebulization every 6 (six) hours as needed for wheezing or shortness of breath. 150 mL 1  . calcitRIOL (ROCALTROL) 0.5 MCG capsule Take 2 mcg by mouth daily.     . citric acid-potassium citrate (POLYCITRA) 1100-334 MG/5ML solution     . cyanocobalamin (,VITAMIN B-12,) 1000 MCG/ML injection Inject 1 mL (1,000 mcg total) into the skin every 30 (thirty) days. 3 mL 4  .  desoximetasone (TOPICORT) 0.05 % cream Apply topically 2 (two) times daily as needed. 60 g 0  . diclofenac sodium (VOLTAREN) 1 % GEL Apply 2 g topically 4 (four) times daily. 100 g 3  . diltiazem (CARDIZEM CD) 120 MG 24 hr capsule Take 1 capsule (120 mg total) by mouth daily. 90 capsule 1  . diphenhydrAMINE (BENADRYL) 25 mg capsule Take 25 mg by mouth every 6 (six) hours as needed for itching.     [name] ELIQUIS 5 MG TABS tablet TAKE 1 TABLET BY MOUTH TWO  TIMES DAILY (Patient taking differently: Take 5 mg by mouth 2 (two) times daily. ) 180 tablet 1  . EPINEPHrine (EPIPEN 2-PAK)  0.3 mg/0.3 mL IJ SOAJ injection Inject 0.3 mg into the muscle once as needed for anaphylaxis (severe allergic reaction).     . furosemide (LASIX) 40 MG tablet TAKE 2 TABLETS BY MOUTH  DAILY IN THE MORNING AND 1  TABLET IN THE EVENING 270 tablet 3  . hydrocortisone cream 1 % Apply topically 4 (four) times daily as needed for itching. 30 g 0  . levocetirizine (XYZAL) 5 MG tablet TAKE 1 TABLET BY MOUTH  EVERY EVENING 90 tablet 3  . levothyroxine (SYNTHROID, LEVOTHROID) 100 MCG tablet Take 400 mcg by mouth daily before breakfast.     . Melatonin 5 MG TABS Take 5 mg by mouth at bedtime as needed (for sleep).     . montelukast (SINGULAIR) 10 MG tablet Take 1 tablet (10 mg total) by mouth at bedtime. 30 tablet 3  . nystatin (MYCOSTATIN/NYSTOP) powder Apply topically 2 (two) times daily. 45 g 1  . nystatin cream (MYCOSTATIN) Apply 1 application topically 2 (two) times daily. 30 g 2  . omeprazole (PRILOSEC) 40 MG capsule TAKE 1 CAPSULE BY MOUTH  DAILY 90 capsule 1  . ondansetron (ZOFRAN) 4 MG tablet Take 1 tablet (4 mg total) by mouth every 8 (eight) hours as needed for nausea or vomiting. 30 tablet 0  . potassium chloride 20 MEQ/15ML (10%) SOLN Take 20 mL by mouth 3 times daily 450 mL 5  . senna (SENOKOT) 8.6 MG TABS tablet Take 1 tablet (8.6 mg total) by mouth 2 (two) times daily. 120 tablet 0  . spironolactone (ALDACTONE) 25 MG  tablet Take 1 tablet (25 mg total) by mouth daily. 30 tablet 2  . SYMBICORT 160-4.5 MCG/ACT inhaler INHALE 2 PUFFS BY MOUTH TWO TIMES DAILY 30.6 g 1  . Syringe/Needle, Disp, (SYRINGE 3CC/20GX1") 20G X 1" 3 ML MISC Use monthly as directed 50 each 0  . traMADol (ULTRAM) 50 MG tablet TAKE 1 TABLET(50 MG) BY MOUTH EVERY 12 HOURS AS NEEDED 30 tablet 0  . trimethoprim (TRIMPEX) 100 MG tablet Take 100 mg by mouth daily.    . Vitamin D, Ergocalciferol, (DRISDOL) 1.25 MG (50000 UT) CAPS capsule TAKE 1 CAPSULE BY MOUTH  EVERY MONDAY, WEDNESDAY,  AND FRIDAY. 39 capsule 1  . zafirlukast (ACCOLATE) 20 MG tablet Take 1 tablet (20 mg total) by mouth 2 (two) times daily before a meal. 30 tablet 0   No current facility-administered medications on file prior to visit.    ALLERGIES: Allergies  Allergen Reactions  . Other Anaphylaxis    Reaction to tree nuts  . Amoxicillin-Pot Clavulanate Other (See Comments)    headache  . Ciprofloxacin Nausea Only and Rash  . Food Hives and Other (See Comments)    Potato  . Keflex [Cephalexin] Rash  . Penicillins Rash    Headache. And hives - Dose not tolerate Cephalosporin either   Has patient had a PCN reaction causing immediate rash, facial/tongue/throat swelling, SOB or lightheadedness with hypotension: No Has patient had a PCN reaction causing severe rash involving mucus membranes or skin necrosis: No Has patient had a PCN reaction that required hospitalization: No Has patient had a PCN reaction occurring within the last 10 years: Yes (reaction was May 2020) If all of the above answers are "NO", then may proce  . Rocephin [Ceftriaxone] Rash  . Tomato Hives    FAMILY HISTORY: Family History  Problem Relation Age of Onset  . Hypertension Father   . Diabetes Father   . Lung cancer Father   .  Heart attack Father        MI at age 69  . Hypertension Mother   . Hyperthyroidism Mother   . Heart disease Mother   . Heart attack Mother        MI at age 30  .  Asthma Brother   . Hypertension Brother        younger  . Heart disease Brother        older  . Colon cancer Neg Hx   . Esophageal cancer Neg Hx   . Stomach cancer Neg Hx   . Kidney disease Neg Hx   . Liver disease Neg Hx     SOCIAL HISTORY: Social History   Socioeconomic History  . Marital status: Single    Spouse name: Not on file  . Number of children: 0  . Years of education: Not on file  . Highest education level: Not on file  Occupational History  . Occupation: works in Insurance claims handler  . Occupation: DESIGN COMPUTER CHIP    Employer: [employer]  Tobacco Use  . Smoking status: Former Smoker    Packs/day: 0.50    Years: 25.00    Pack years: 12.50    Types: Cigarettes    Start date: 12/24/1970    Quit date: 05/27/1995    Years since quitting: 24.0  . Smokeless tobacco: Never Used  . Tobacco comment: quit smoking 19 years ago  Substance and Sexual Activity  . Alcohol use: Yes    Alcohol/week: 0.0 standard drinks    Comment: 1/2 glass per month  . Drug use: No  . Sexual activity: Yes    Partners: Male    Birth control/protection: Post-menopausal  Other Topics Concern  . Not on file  Social History Narrative   Occupation: works in Insurance claims handler - Field seismologist   Single       Former Smoker - quit tobacco 12 years ago.  She was light smoker for 10 years.                Social Determinants of Health   Financial Resource Strain:   . Difficulty of Paying Living Expenses: Not on file  Food Insecurity:   . Worried About Charity fundraiser in the Last Year: Not on file  . Ran Out of Food in the Last Year: Not on file  Transportation Needs:   . Lack of Transportation (Medical): Not on file  . Lack of Transportation (Non-Medical): Not on file  Physical Activity:   . Days of Exercise per Week: Not on file  . Minutes of Exercise per Session: Not on file  Stress:   . Feeling of Stress : Not on file  Social Connections:   . Frequency of  Communication with Friends and Family: Not on file  . Frequency of Social Gatherings with Friends and Family: Not on file  . Attends Religious Services: Not on file  . Active Member of Clubs or Organizations: Not on file  . Attends Archivist Meetings: Not on file  . Marital Status: Not on file  Intimate Partner Violence:   . Fear of Current or Ex-Partner: Not on file  . Emotionally Abused: Not on file  . Physically Abused: Not on file  . Sexually Abused: Not on file    REVIEW OF SYSTEMS: Constitutional: No fevers, chills, or sweats, no generalized fatigue, change in appetite Eyes: No visual changes, double vision, eye pain Ear, nose and throat: No hearing loss, ear  pain, nasal congestion, sore throat Cardiovascular: No chest pain, palpitations Respiratory:  No shortness of breath at rest or with exertion, wheezes GastrointestinaI: No nausea, vomiting, diarrhea, abdominal pain, fecal incontinence Genitourinary:  No dysuria, urinary retention or frequency Musculoskeletal:  No neck pain, back pain Integumentary: No rash, pruritus, skin lesions Neurological: as above Psychiatric: No depression, insomnia, anxiety Endocrine: No palpitations, fatigue, diaphoresis, mood swings, change in appetite, change in weight, increased thirst Hematologic/Lymphatic:  No anemia, purpura, petechiae. Allergic/Immunologic: no itchy/runny eyes, nasal congestion, recent allergic reactions, rashes  PHYSICAL EXAM: Vitals:   06/10/19 1051  BP: 124/76  Pulse: 65  SpO2: 95%   General: No acute distress Head:  Normocephalic/atraumatic Skin/Extremities: No rash, no edema Neurological Exam: Mental status: alert and oriented to person, place, and time, no dysarthria or aphasia, Fund of knowledge is appropriate.  Recent and remote memory are intact.  Attention and concentration are normal.   SLUMS score 23/30 [employer] Mental Exam 06/10/2019  Weekday Correct 1  Current year 1  What state  are we in? 1  Amount spent 1  Amount left 2  # of Animals 3  5 objects recall 3  Number series 1  Hour markers 2  Time correct 2  Placed X in triangle correctly 1  Largest Figure 1  Name of female 2  Date back to work 0  Type of work 2  State she lived in 0  Total score 23    Cranial nerves: CN I: not tested CN II: pupils equal, round and reactive to light, visual fields intact CN III, IV, VI:  full range of motion, no nystagmus, no ptosis CN V: facial sensation intact CN VII: upper and lower face symmetric CN VIII: hearing intact to conversation CN IX, X: gag intact, uvula midline CN XI: sternocleidomastoid and trapezius muscles intact CN XII: tongue midline Bulk & Tone: normal, no fasciculations. Motor: 5/5 throughout with no pronator drift. Sensation: intact to light touch, cold, pin on both UE, decreased pin and cold on both LE below knees. No extinction to double simultaneous stimulation. Deep Tendon Reflexes: brisk +3 on both UE with +Hoffman sign bilaterally, +1 both LE, no ankle clonus Plantar responses: downgoing bilaterally Cerebellar: no incoordination on finger to nose testing Gait: ataxic, very unsteady with 2-person assist Tremor: none  IMPRESSION: This is a pleasant 64 year old right-handed woman with a history of atrial fibrillation, COPD, hyperparathyroidism, sacral decubitus ulcer, nephrolithiasis, fibromyalgia, presenting for evaluation of memory loss. She reports word-finding difficulties. Cognitive changes have started to affect her work. She has also been having frequent falls over the past 3-4 years. Neurological exam shows hyperreflexia on both upper extremities concerning for myelopathy, she also has evidence of a length-dependent neuropathy. SLUMS score today 23/30, indicating Mild Cognitive Impairment. Etiology of symptoms unclear, MRI brain with and without contrast and MRI cervical spine with and without contrast will be ordered to assess for  underlying structural abnormality. She will be scheduled for Neurocognitive testing to further evaluate cognitive complaints. We discussed how stress/mood can also affect memory, continue to monitor. Continue close family supervision. She does not drive. Follow-up after tests, they know to call for any changes.   Thank you for allowing me to participate in the care of this patient. Please do not hesitate to call for any questions or concerns.   Ellouise Newer, M.D.  CC: Dr. Lorelei Pont, Debbrah Alar, NP

## 2019-06-10 NOTE — Patient Instructions (Addendum)
1. Schedule MRI brain with and without contrast. [facility] will call you with an appointment date and time. If needed their number is [phone] 2. Schedule MRI cervical spine with and without contrast 3. Schedule Neurocognitive testing with Dr. Melvyn Novas. Please stop by check out and schedule these appointments before you leave today. 4. Continue to monitor mood 5. Follow-up after tests   RECOMMENDATIONS FOR ALL PATIENTS WITH MEMORY PROBLEMS: 1. Continue to exercise (Recommend 30 minutes of walking everyday, or 3 hours every week) 2. Increase social interactions - continue going to [place] and enjoy social gatherings with friends and family 3. Eat healthy, avoid fried foods and eat more fruits and vegetables 4. Maintain adequate blood pressure, blood sugar, and blood cholesterol level. Reducing the risk of stroke and cardiovascular disease also helps promoting better memory. 5. Avoid stressful situations. Live a simple life and avoid aggravations. Organize your time and prepare for the next day in anticipation. 6. Sleep well, avoid any interruptions of sleep and avoid any distractions in the bedroom that may interfere with adequate sleep quality 7. Avoid sugar, avoid sweets as there is a strong link between excessive sugar intake, diabetes, and cognitive impairment The Mediterranean diet has been shown to help patients reduce the risk of progressive memory disorders and reduces cardiovascular risk. This includes eating fish, eat fruits and green leafy vegetables, nuts like almonds and hazelnuts, walnuts, and also use olive oil. Avoid fast foods and fried foods as much as possible. Avoid sweets and sugar as sugar use has been linked to worsening of memory function.

## 2019-06-15 NOTE — Progress Notes (Signed)
Virtual Visit via Video Note changed to phone visit at patient request   This visit type was conducted due to national recommendations for restrictions regarding the COVID-19 Pandemic (e.g. social distancing) in an effort to limit this patient's exposure and mitigate transmission in our community.  Due to her co-morbid illnesses, this patient is at least at moderate risk for complications without adequate follow up.  This format is felt to be most appropriate for this patient at this time.  All issues noted in this document were discussed and addressed.  A limited physical exam was performed with this format.  Please refer to the patient's chart for her consent to telehealth for [facility].   Date:  06/17/2019   ID:  [name], DOB 07/10/1955, MRN [id number]  Patient Location:Home Provider Location: Home  PCP:  Debbrah Alar, NP  Cardiologist:  Dr Stanford Breed  Evaluation Performed:  Follow-Up Visit  Chief Complaint:  FU atrial fibrillation  History of Present Illness:    FU atrial fibrillation. Myoview July 2012showed EF64% mild ischemia in the inferior wall. We elected to treat medically. Patient was also having palpitations and near syncopal episodes. A CardioNet revealed sinus rhythm with PACs, brief runs of PAT and brief runs of PAF. MRA in September of 2014 showed no aneurysm. Xarelto DCed previously due to GI bleed; WU showed esophagitis; anticoagulation resumed.ABIs November 2019-waveform analysis showed no evidence of significant right or left lower extremity arterial disease.  Echocardiogram June 2020 showed normal LV function.  Since last seen, she denies dyspnea, chest pain, syncope.  Pedal edema is well controlled.  She has occasional flutters in her chest for 20 seconds.  The patient does not have symptoms concerning for COVID-19 infection (fever, chills, cough, or new shortness of breath).    Past Medical History:  Diagnosis Date   Abnormal Pap smear    years ago/no biopsy   Allergic rhinitis    Anemia, iron deficiency    iron infusion scheduled for 04/02/2018   Aortic atherosclerosis (HCC)    Arthritis    back- lower   Asthma    Atrial fibrillation ([place]) August 2012   OFF XARELTO LAST MONTH DUE TO BLEEDING IN STOOL   Bursitis of hip left   Chronic diastolic (congestive) heart failure (HCC)    pt unaware of this   COPD (chronic obstructive pulmonary disease) with chronic bronchitis (HCC)    Decreased pulses in feet    Dysrhythmia    afib, followed by Dr. Stanford Breed    Edema of both legs    Esophagitis 02/02/2014   Distal, linear erosions, noted on endoscopy   Fatty liver 01/07/2012   Fibroid 1974   fibroid cyst on left fallopian tube   Fibromyalgia    Foot ulcer ([place])    AREA HEALED RIGHT FOOT   GERD (gastroesophageal reflux disease)    Headache(784.0)    occasional sinus headache    Hepatomegaly    History of blood transfusion JULY 2015   History of cardiomegaly 12/06/2010   Noted on CT   History of E. coli septicemia    Hypertension    Hypoparathyroidism ([place]) 01/07/2011   Hypothyroidism    Kidney stone 10-03-15   passed on their own   Morbid obesity ([place])    MRSA infection    Nephrolithiasis 2/16, 9/16   SEES DR GRAPEY   Pernicious anemia 02/20/2014   followed by Debbrah Alar   Pneumonia    PONV (postoperative nausea and vomiting)  Pyelonephritis 11/24/2018   Rash    thighs and back   Seizures ([place])    infancy secondary to fever   Staph aureus infection    Thoracic spondylosis 12/06/2010   Noted on CT   Thyroid cancer ([place]) 2011   THYROIDECTOMY DONE   Uterine cancer ([place]) 2014   Mirena IUD   Vitamin B 12 deficiency    Vitamin D deficiency    Past Surgical History:  Procedure Laterality Date   AMPUTATION Right 05/18/2014   Procedure: RIGHT FIFTH RAY AMPUTATION FOOT;  Surgeon: Wylene Simmer, MD;  Location: [place];  Service: Orthopedics;  Laterality: Right;    APPENDECTOMY     BUNIONECTOMY     bilateral   COLONOSCOPY WITH PROPOFOL N/A 02/02/2014   Procedure: COLONOSCOPY WITH PROPOFOL;  Surgeon: Irene Shipper, MD;  Location: [facility];  Service: Endoscopy;  Laterality: N/A;   cyst on ovary removed      CYSTOSCOPY W/ RETROGRADES  03/03/2012   Procedure: CYSTOSCOPY WITH RETROGRADE PYELOGRAM;  Surgeon: Bernestine Amass, MD;  Location: [facility];  Service: Urology;  Laterality: Bilateral;   CYSTOSCOPY W/ URETERAL STENT PLACEMENT Right 11/25/2017   Procedure: CYSTOSCOPY WITH RETROGRADE RIGHT URETERAL STENT PLACEMENT;  Surgeon: Franchot Gallo, MD;  Location: [place];  Service: Urology;  Laterality: Right;   CYSTOSCOPY W/ URETERAL STENT PLACEMENT Right 01/15/2018   Procedure: RIGHT STENT EXCHANGE;  Surgeon: Ardis Hughs, MD;  Location: [facility];  Service: Urology;  Laterality: Right;   CYSTOSCOPY WITH RETROGRADE PYELOGRAM, URETEROSCOPY AND STENT PLACEMENT Left 08/25/2012   Procedure: CYSTOSCOPY WITH RETROGRADE PYELOGRAM, URETEROSCOPY;  Surgeon: Bernestine Amass, MD;  Location: [facility];  Service: Urology;  Laterality: Left;   CYSTOSCOPY WITH STENT PLACEMENT Left 01/15/2018   Procedure: LEFT STENT PLACEMENT;  Surgeon: Ardis Hughs, MD;  Location: [facility];  Service: Urology;  Laterality: Left;   CYSTOSCOPY WITH STENT PLACEMENT Right 01/22/2018   Procedure: RIGHT STENT REMOVAL;  Surgeon: Ardis Hughs, MD;  Location: [facility];  Service: Urology;  Laterality: Right;   CYSTOSCOPY/URETEROSCOPY/HOLMIUM LASER/STENT PLACEMENT Right 12/23/2017   Procedure: RIGHT URETEROSCOPY STONE REMOVAL, HOLMIUM LASER, RIGHT /STENT EXCHANGE;  Surgeon: Ardis Hughs, MD;  Location: [facility];  Service: Urology;  Laterality: Right;   CYSTOSCOPY/URETEROSCOPY/HOLMIUM LASER/STENT PLACEMENT Left 01/22/2018   Procedure: LEFT URETEROSCOPY STONE REMOVAL HOLMIUM LASER LEFT STENT Freddi Starr;  Surgeon: Ardis Hughs, MD;  Location: [facility];  Service:  Urology;  Laterality: Left;   DILATION AND CURETTAGE OF UTERUS N/A 02/21/2013   Procedure: DILATATION AND CURETTAGE;  Surgeon: Lyman Speller, MD;  Location: [facility];  Service: Gynecology;  Laterality: N/A;   DILATION AND CURETTAGE OF UTERUS N/A 04/09/2015   Procedure: [place] IUD removal;  Surgeon: Megan Salon, MD;  Location: [facility];  Service: Gynecology;  Laterality: N/A;  Patient weight 307lbs   ESOPHAGOGASTRODUODENOSCOPY (EGD) WITH PROPOFOL N/A 02/02/2014   Procedure: ESOPHAGOGASTRODUODENOSCOPY (EGD) WITH PROPOFOL;  Surgeon: Irene Shipper, MD;  [facility];  Service: Endoscopy;  Laterality: N/A;   GASTRIC BYPASS  1974   HOLMIUM LASER APPLICATION Left 0000000   Procedure: HOLMIUM LASER APPLICATION;  Surgeon: Bernestine Amass, MD;  Location: [facility];  Service: Urology;  Laterality: Left;   LITHOTRIPSY  03/2012   LITHOTRIPSY  2/16   THYROIDECTOMY  05/15/2010   TONSILLECTOMY AND ADENOIDECTOMY     URETEROSCOPY  03/03/2012   Procedure: URETEROSCOPY;  Surgeon: Bernestine Amass, MD;  Location[facility];  Service:  Urology;  Laterality: Left;   URETEROSCOPY WITH HOLMIUM LASER LITHOTRIPSY Bilateral 01/15/2018   Procedure: CYSTOSCOPY/BILATERAL URETEROSCOPY WITH HOLMIUM LASER LITHOTRIPSY STONE REMOVAL;  Surgeon: Ardis Hughs, MD;  Location: [facility];  Service: Urology;  Laterality: Bilateral;     Current Meds  Medication Sig   acetaminophen (TYLENOL) 325 MG tablet Take 2 tablets (650 mg total) by mouth every 6 (six) hours as needed for mild pain or moderate pain.   albuterol (PROVENTIL HFA;VENTOLIN HFA) 108 (90 Base) MCG/ACT inhaler Inhale 2 puffs into the lungs every 6 (six) hours as needed for wheezing or shortness of breath.   albuterol (PROVENTIL) (2.5 MG/3ML) 0.083% nebulizer solution Take 3 mLs (2.5 mg total) by nebulization every 6 (six) hours as needed for wheezing or shortness of breath.   calcitRIOL (ROCALTROL) 0.5 MCG capsule Take 2 mcg by  mouth daily.    cyanocobalamin (,VITAMIN B-12,) 1000 MCG/ML injection Inject 1 mL (1,000 mcg total) into the skin every 30 (thirty) days.   desoximetasone (TOPICORT) 0.05 % cream Apply topically 2 (two) times daily as needed.   diclofenac sodium (VOLTAREN) 1 % GEL Apply 2 g topically 4 (four) times daily.   diltiazem (CARDIZEM CD) 120 MG 24 hr capsule Take 1 capsule (120 mg total) by mouth daily.   diphenhydrAMINE (BENADRYL) 25 mg capsule Take 25 mg by mouth every 6 (six) hours as needed for itching.    EPINEPHrine (EPIPEN 2-PAK) 0.3 mg/0.3 mL IJ SOAJ injection Inject 0.3 mg into the muscle once as needed for anaphylaxis (severe allergic reaction).    furosemide (LASIX) 40 MG tablet TAKE 2 TABLETS BY MOUTH  DAILY IN THE MORNING AND 1  TABLET IN THE EVENING   hydrocortisone cream 1 % Apply topically 4 (four) times daily as needed for itching.   levocetirizine (XYZAL) 5 MG tablet TAKE 1 TABLET BY MOUTH  EVERY EVENING   levothyroxine (SYNTHROID, LEVOTHROID) 100 MCG tablet Take 400 mcg by mouth daily before breakfast.    Melatonin 5 MG TABS Take 5 mg by mouth at bedtime as needed (for sleep).    montelukast (SINGULAIR) 10 MG tablet Take 1 tablet (10 mg total) by mouth at bedtime.   nystatin (MYCOSTATIN/NYSTOP) powder Apply topically 2 (two) times daily.   nystatin cream (MYCOSTATIN) Apply 1 application topically 2 (two) times daily.   omeprazole (PRILOSEC) 40 MG capsule TAKE 1 CAPSULE BY MOUTH  DAILY   ondansetron (ZOFRAN) 4 MG tablet Take 1 tablet (4 mg total) by mouth every 8 (eight) hours as needed for nausea or vomiting.   spironolactone (ALDACTONE) 25 MG tablet Take 1 tablet (25 mg total) by mouth daily.   SYMBICORT 160-4.5 MCG/ACT inhaler INHALE 2 PUFFS BY MOUTH TWO TIMES DAILY   Syringe/Needle, Disp, (SYRINGE 3CC/20GX1") 20G X 1" 3 ML MISC Use monthly as directed   traMADol (ULTRAM) 50 MG tablet TAKE 1 TABLET(50 MG) BY MOUTH EVERY 12 HOURS AS NEEDED   trimethoprim  (TRIMPEX) 100 MG tablet Take 100 mg by mouth daily.   Vitamin D, Ergocalciferol, (DRISDOL) 1.25 MG (50000 UT) CAPS capsule TAKE 1 CAPSULE BY MOUTH  EVERY MONDAY, WEDNESDAY,  AND FRIDAY.   zafirlukast (ACCOLATE) 20 MG tablet Take 1 tablet (20 mg total) by mouth 2 (two) times daily before a meal.     Allergies:   Other, Amoxicillin-pot clavulanate, Ciprofloxacin, Food, Keflex [cephalexin], Penicillins, Rocephin [ceftriaxone], and Tomato   Social History   Tobacco Use   Smoking status: Former Smoker    Packs/day: 0.50  Years: 25.00    Pack years: 12.50    Types: Cigarettes    Start date: 12/24/1970    Quit date: 05/27/1995    Years since quitting: 24.0   Smokeless tobacco: Never Used   Tobacco comment: quit smoking 19 years ago  Substance Use Topics   Alcohol use: Yes    Alcohol/week: 0.0 standard drinks    Comment: 1/2 glass per month   Drug use: No     Family Hx: The patient's family history includes Asthma in her brother; Diabetes in her father; Heart attack in her father and mother; Heart disease in her brother and mother; Hypertension in her brother, father, and mother; Hyperthyroidism in her mother; Lung cancer in her father. There is no history of Colon cancer, Esophageal cancer, Stomach cancer, Kidney disease, or Liver disease.  ROS:   Please see the history of present illness.    No Fever, chills  or productive cough All other systems reviewed and are negative.   Recent Labs: 11/21/2018: B Natriuretic Peptide 56.3 11/25/2018: Magnesium 2.1 04/06/2019: ALT 38 04/20/2019: TSH 0.44 05/31/2019: BUN 40; Creatinine, Ser 1.48; Potassium 4.0; Sodium 136 06/06/2019: Hemoglobin 10.9; Platelets 189.0   Recent Lipid Panel Lab Results  Component Value Date/Time   CHOL 146 03/19/2016 10:30 AM   TRIG 98.0 03/19/2016 10:30 AM   HDL 49.30 03/19/2016 10:30 AM   CHOLHDL 3 03/19/2016 10:30 AM   LDLCALC 77 03/19/2016 10:30 AM    Wt Readings from Last 3 Encounters:  06/17/19  243 lb (110.2 kg)  06/10/19 243 lb (110.2 kg)  05/31/19 156 lb 6.4 oz (70.9 kg)     Objective:    Vital Signs:  BP 107/65    Ht 5\' 4"  (1.626 m)    Wt 243 lb (110.2 kg)    LMP 06/11/2012    BMI 41.71 kg/m    VITAL SIGNS:  reviewed NAD Answers questions appropriately Normal affect Remainder of physical examination not performed (telehealth visit; coronavirus pandemic)  ASSESSMENT & PLAN:    1. Paroxysmal atrial fibrillation-patient remains in sinus rhythm.  Continue Cardizem.  Patient discontinued anticoagulation on her own due to bruising.  I discussed the risk of paroxysmal atrial fibrillation and CVA.  She understands the risk and would prefer to stay off of anticoagulation. 2. Hypertension-patient's blood pressure is controlled by her report.  Continue present medications and follow. 3. History of lower extremity edema-based on history edema is well controlled.  Recent laboratories on January 5 showed a BUN of 40 and creatinine 1.48.  Decrease Lasix to 40 mg twice daily.  Check potassium and renal function in 2 weeks. 4. Morbid obesity-patient has lost 73 pounds over the past 6 months.  I congratulated her on her efforts and she will continue.  COVID-19 Education: The importance of social distancing was discussed today.  Time:   Today, I have spent 15 minutes with the patient with telehealth technology discussing the above problems.     Medication Adjustments/Labs and Tests Ordered: Current medicines are reviewed at length with the patient today.  Concerns regarding medicines are outlined above.   Tests Ordered: No orders of the defined types were placed in this encounter.   Medication Changes: No orders of the defined types were placed in this encounter.   Follow Up:  Either In Person or Virtual in 1 year(s)  Signed, Kirk Ruths, MD  06/17/2019 8:17 AM    [place]

## 2019-06-16 ENCOUNTER — Encounter: Payer: Self-pay | Admitting: Family Medicine

## 2019-06-16 ENCOUNTER — Other Ambulatory Visit: Payer: Self-pay

## 2019-06-16 ENCOUNTER — Ambulatory Visit: Payer: 59 | Admitting: Family Medicine

## 2019-06-16 VITALS — BP 105/60 | HR 66 | Temp 96.1°F | Resp 17

## 2019-06-16 DIAGNOSIS — I952 Hypotension due to drugs: Secondary | ICD-10-CM | POA: Diagnosis not present

## 2019-06-16 DIAGNOSIS — L731 Pseudofolliculitis barbae: Secondary | ICD-10-CM | POA: Diagnosis not present

## 2019-06-16 DIAGNOSIS — R5381 Other malaise: Secondary | ICD-10-CM

## 2019-06-16 DIAGNOSIS — L89152 Pressure ulcer of sacral region, stage 2: Secondary | ICD-10-CM | POA: Diagnosis not present

## 2019-06-16 NOTE — Progress Notes (Signed)
[facility] [address], [place], [place] [address] [phone]  Date:  06/16/2019   Name:  [name]   DOB:  [birth year]   MRN:  [id number]  PCP:  Debbrah Alar, NP    Chief Complaint: Wound Check (fissures in wound)   History of Present Illness:  [name] is a 64 y.o. very pleasant female patient who presents with the following:  Patient of Lemar Livings whom I have seen in the past, most recently in late November History of bariatric surgery, resulted iron deficiency anemia, A. fib, COPD, hyperparathyroidism, multiple episodes of kidney stones Here today to recheck a sacral decubitus ulcer-seen most recently by [facility] on January 5 Unfortunately we have not been able to get her home health PT, or nursing care out to see her A family member is helping to manage her sacral decubitus ulcer.  It does seem to be improving, but they are concerned about some bleeding that tends to occur when they change the right side of her bandage-would like me to check this today  The patient also notes a small boil in the left groin, this is been there for perhaps 2 days.  It is not particularly painful  She otherwise feels well, no fever or chills  Labs drawn on January 5 showed stable anemia, elevated calcium  She saw neurology about a week ago to discuss memory loss, Dr. Delice Lesch has ordered an MRI and also neurocognitive testing    Patient Active Problem List   Diagnosis Date Noted  . Chronic anticoagulation 12/16/2018  . Abnormal nuclear stress test 12/16/2018  . Debilitated patient 12/16/2018  . Pressure injury of skin 12/13/2018  . UTI (urinary tract infection) 12/13/2018  . Pyelonephritis 11/21/2018  . Sepsis due to Escherichia coli (E. coli) ([place]) 11/28/2017  . E coli bacteremia 11/28/2017  . Sepsis ([place]) 11/24/2017  . Hypokalemia 12/10/2016  . Hypocalcemia 12/10/2016  . Recurrent falls  12/09/2016  . Right ureteral stone 07/17/2014  . Osteomyelitis ([place]) 06/14/2014  . Pernicious anemia 02/20/2014  . Reflux esophagitis 02/02/2014  . Edema 11/23/2013  . Foot pain 03/31/2013  . Endometrial adenocarcinoma ([place]) 02/21/2013  . Trochanteric bursitis of left hip 09/13/2012  . Muscle cramps 08/12/2012  . Fatty liver 01/07/2012  . Mid back pain 09/30/2011  . Weight gain 07/02/2011  . Nonspecific abnormal unspecified cardiovascular function study 04/30/2011  . Hx of papillary thyroid carcinoma 04/07/2011  . Low back pain 04/01/2011  . PAF (paroxysmal atrial fibrillation) ([place]) 02/05/2011  . Hypoparathyroidism ([place]) 01/07/2011  . GERD 05/22/2009  . Leukocytosis 03/24/2009  . Morbid obesity ([place]) 12/20/2008  . RHINOSINUSITIS, RECURRENT 09/07/2008  . Vitamin D deficiency 05/10/2007  . Iron deficiency anemia secondary to malabsorption  05/10/2007  . Hypothyroidism 01/05/2007  . Essential hypertension 01/05/2007  . ALLERGIC RHINITIS 01/05/2007  . Asthma 01/05/2007    Past Medical History:  Diagnosis Date  . Abnormal Pap smear    years ago/no biopsy  . Allergic rhinitis   . Anemia, iron deficiency    iron infusion scheduled for 04/02/2018  . Aortic atherosclerosis ([place])   . Arthritis    back- lower  . Asthma   . Atrial fibrillation ([place]) August 2012   OFF XARELTO LAST MONTH DUE TO BLEEDING IN STOOL  . Bursitis of hip left  . Chronic diastolic (congestive) heart failure (HCC)    pt unaware of this  . COPD (chronic obstructive pulmonary  disease) with chronic bronchitis ([place])   . Decreased pulses in feet   . Dysrhythmia    afib, followed by Dr. Stanford Breed   . Edema of both legs   . Esophagitis 02/02/2014   Distal, linear erosions, noted on endoscopy  . Fatty liver 01/07/2012  . Fibroid 1974   fibroid cyst on left fallopian tube  . Fibromyalgia   . Foot ulcer ([place])    AREA HEALED RIGHT FOOT  . GERD (gastroesophageal reflux disease)   . Headache(784.0)     occasional sinus headache   . Hepatomegaly   . History of blood transfusion JULY 2015  . History of cardiomegaly 12/06/2010   Noted on CT  . History of E. coli septicemia   . Hypertension   . Hypoparathyroidism ([place]) 01/07/2011  . Hypothyroidism   . Kidney stone 10/06/15   passed on their own  . Morbid obesity ([place])   . MRSA infection   . Nephrolithiasis 2/16, 9/16   SEES DR Risa Grill  . Pernicious anemia 02/20/2014   followed by Debbrah Alar  . Pneumonia   . PONV (postoperative nausea and vomiting)   . Pyelonephritis 11/24/2018  . Rash    thighs and back  . Seizures ([place])    infancy secondary to fever  . Staph aureus infection   . Thoracic spondylosis 12/06/2010   Noted on CT  . Thyroid cancer ([place]) 2011   THYROIDECTOMY DONE  . Uterine cancer ([place]) 2014   Mirena IUD  . Vitamin B 12 deficiency   . Vitamin D deficiency     Past Surgical History:  Procedure Laterality Date  . AMPUTATION Right 05/18/2014   Procedure: RIGHT FIFTH RAY AMPUTATION FOOT;  Surgeon: Wylene Simmer, MD;  Location: [facility];  Service: Orthopedics;  Laterality: Right;  . APPENDECTOMY    . BUNIONECTOMY     bilateral  . COLONOSCOPY WITH PROPOFOL N/A 02/02/2014   Procedure: COLONOSCOPY WITH PROPOFOL;  Surgeon: Irene Shipper, MD;  [facility]: [facility];  Service: Endoscopy;  Laterality: N/A;  . cyst on ovary removed     . CYSTOSCOPY W/ RETROGRADES  03/03/2012   Procedure: CYSTOSCOPY WITH RETROGRADE PYELOGRAM;  Surgeon: Bernestine Amass, MD;  [facility]: [facility];  Service: Urology;  Laterality: Bilateral;  . CYSTOSCOPY W/ URETERAL STENT PLACEMENT Right 11/25/2017   Procedure: CYSTOSCOPY WITH RETROGRADE RIGHT URETERAL STENT PLACEMENT;  Surgeon: Franchot Gallo, MD;  Location: [facility];  Service: Urology;  Laterality: Right;  . CYSTOSCOPY W/ URETERAL STENT PLACEMENT Right 01/15/2018   Procedure: RIGHT STENT EXCHANGE;  Surgeon: Ardis Hughs, MD;  [facility];  Service: Urology;  Laterality: Right;  .  CYSTOSCOPY WITH RETROGRADE PYELOGRAM, URETEROSCOPY AND STENT PLACEMENT Left 08/25/2012   Procedure: CYSTOSCOPY WITH RETROGRADE PYELOGRAM, URETEROSCOPY;  Surgeon: Bernestine Amass, MD;  [facility];  Service: Urology;  Laterality: Left;  . CYSTOSCOPY WITH STENT PLACEMENT Left 01/15/2018   Procedure: LEFT STENT PLACEMENT;  Surgeon: Ardis Hughs, MD;  [facility];  Service: Urology;  Laterality: Left;  . CYSTOSCOPY WITH STENT PLACEMENT Right 01/22/2018   Procedure: RIGHT STENT REMOVAL;  Surgeon: Ardis Hughs, MD;  [facility];  Service: Urology;  Laterality: Right;  . CYSTOSCOPY/URETEROSCOPY/HOLMIUM LASER/STENT PLACEMENT Right 12/23/2017   Procedure: RIGHT URETEROSCOPY STONE REMOVAL, HOLMIUM LASER, RIGHT /STENT EXCHANGE;  Surgeon: Ardis Hughs, MD;  [facility];  Service: Urology;  Laterality: Right;  . CYSTOSCOPY/URETEROSCOPY/HOLMIUM LASER/STENT PLACEMENT Left 01/22/2018   Procedure: LEFT URETEROSCOPY STONE REMOVAL HOLMIUM LASER LEFT  STENT Freddi Starr;  Surgeon: Ardis Hughs, MD;  [facility];  Service: Urology;  Laterality: Left;  . DILATION AND CURETTAGE OF UTERUS N/A 02/21/2013   Procedure: DILATATION AND CURETTAGE;  Surgeon: Lyman Speller, MD;  Location: [facility];  Service: Gynecology;  Laterality: N/A;  . DILATION AND CURETTAGE OF UTERUS N/A 04/09/2015   Procedure: [place] IUD removal;  Surgeon: Megan Salon, MD;  Location: [facility];  Service: Gynecology;  Laterality: N/A;  Patient weight 307lbs  . ESOPHAGOGASTRODUODENOSCOPY (EGD) WITH PROPOFOL N/A 02/02/2014   Procedure: ESOPHAGOGASTRODUODENOSCOPY (EGD) WITH PROPOFOL;  Surgeon: Irene Shipper, MD;  [facility];  Service: Endoscopy;  Laterality: N/A;  . GASTRIC BYPASS  1974  . HOLMIUM LASER APPLICATION Left 0000000   Procedure: HOLMIUM LASER APPLICATION;  Surgeon: Bernestine Amass, MD;  [facility];  Service: Urology;  Laterality: Left;  .  LITHOTRIPSY  03/2012  . LITHOTRIPSY  2/16  . THYROIDECTOMY  05/15/2010  . TONSILLECTOMY AND ADENOIDECTOMY    . URETEROSCOPY  03/03/2012   Procedure: URETEROSCOPY;  Surgeon: Bernestine Amass, MD;  [facility];  Service: Urology;  Laterality: Left;  . URETEROSCOPY WITH HOLMIUM LASER LITHOTRIPSY Bilateral 01/15/2018   Procedure: CYSTOSCOPY/BILATERAL URETEROSCOPY WITH HOLMIUM LASER LITHOTRIPSY STONE REMOVAL;  Surgeon: Ardis Hughs, MD;  [facility];  Service: Urology;  Laterality: Bilateral;    Social History   Tobacco Use  . Smoking status: Former Smoker    Packs/day: 0.50    Years: 25.00    Pack years: 12.50    Types: Cigarettes    Start date: 12/24/1970    Quit date: 05/27/1995    Years since quitting: 24.0  . Smokeless tobacco: Never Used  . Tobacco comment: quit smoking 19 years ago  Substance Use Topics  . Alcohol use: Yes    Alcohol/week: 0.0 standard drinks    Comment: 1/2 glass per month  . Drug use: No    Family History  Problem Relation Age of Onset  . Hypertension Father   . Diabetes Father   . Lung cancer Father   . Heart attack Father        MI at age 96  . Hypertension Mother   . Hyperthyroidism Mother   . Heart disease Mother   . Heart attack Mother        MI at age 50  . Asthma Brother   . Hypertension Brother        younger  . Heart disease Brother        older  . Colon cancer Neg Hx   . Esophageal cancer Neg Hx   . Stomach cancer Neg Hx   . Kidney disease Neg Hx   . Liver disease Neg Hx     Allergies  Allergen Reactions  . Other Anaphylaxis    Reaction to tree nuts  . Amoxicillin-Pot Clavulanate Other (See Comments)    headache  . Ciprofloxacin Nausea Only and Rash  . Food Hives and Other (See Comments)    Potato  . Keflex [Cephalexin] Rash  . Penicillins Rash    Headache. And hives - Dose not tolerate Cephalosporin either   Has patient had a PCN reaction causing immediate rash, facial/tongue/throat swelling, SOB or  lightheadedness with hypotension: No Has patient had a PCN reaction causing severe rash involving mucus membranes or skin necrosis: No Has patient had a PCN reaction that required hospitalization: No Has patient had a PCN reaction occurring within the  last 10 years: Yes (reaction was May 2020) If all of the above answers are "NO", then may proce  . Rocephin [Ceftriaxone] Rash  . Tomato Hives    Medication list has been reviewed and updated.  Current Outpatient Medications on File Prior to Visit  Medication Sig Dispense Refill  . acetaminophen (TYLENOL) 325 MG tablet Take 2 tablets (650 mg total) by mouth every 6 (six) hours as needed for mild pain or moderate pain. 60 tablet 0  . albuterol (PROVENTIL HFA;VENTOLIN HFA) 108 (90 Base) MCG/ACT inhaler Inhale 2 puffs into the lungs every 6 (six) hours as needed for wheezing or shortness of breath. 1 Inhaler 5  . albuterol (PROVENTIL) (2.5 MG/3ML) 0.083% nebulizer solution Take 3 mLs (2.5 mg total) by nebulization every 6 (six) hours as needed for wheezing or shortness of breath. 150 mL 1  . calcitRIOL (ROCALTROL) 0.5 MCG capsule Take 2 mcg by mouth daily.     . cyanocobalamin (,VITAMIN B-12,) 1000 MCG/ML injection Inject 1 mL (1,000 mcg total) into the skin every 30 (thirty) days. 3 mL 4  . desoximetasone (TOPICORT) 0.05 % cream Apply topically 2 (two) times daily as needed. 60 g 0  . diclofenac sodium (VOLTAREN) 1 % GEL Apply 2 g topically 4 (four) times daily. 100 g 3  . diltiazem (CARDIZEM CD) 120 MG 24 hr capsule Take 1 capsule (120 mg total) by mouth daily. 90 capsule 1  . diphenhydrAMINE (BENADRYL) 25 mg capsule Take 25 mg by mouth every 6 (six) hours as needed for itching.     [name] EPINEPHrine (EPIPEN 2-PAK) 0.3 mg/0.3 mL IJ SOAJ injection Inject 0.3 mg into the muscle once as needed for anaphylaxis (severe allergic reaction).     . furosemide (LASIX) 40 MG tablet TAKE 2 TABLETS BY MOUTH  DAILY IN THE MORNING AND 1  TABLET IN THE EVENING 270  tablet 3  . hydrocortisone cream 1 % Apply topically 4 (four) times daily as needed for itching. 30 g 0  . levocetirizine (XYZAL) 5 MG tablet TAKE 1 TABLET BY MOUTH  EVERY EVENING 90 tablet 3  . levothyroxine (SYNTHROID, LEVOTHROID) 100 MCG tablet Take 400 mcg by mouth daily before breakfast.     . Melatonin 5 MG TABS Take 5 mg by mouth at bedtime as needed (for sleep).     . montelukast (SINGULAIR) 10 MG tablet Take 1 tablet (10 mg total) by mouth at bedtime. 30 tablet 3  . nystatin (MYCOSTATIN/NYSTOP) powder Apply topically 2 (two) times daily. 45 g 1  . nystatin cream (MYCOSTATIN) Apply 1 application topically 2 (two) times daily. 30 g 2  . omeprazole (PRILOSEC) 40 MG capsule TAKE 1 CAPSULE BY MOUTH  DAILY 90 capsule 1  . ondansetron (ZOFRAN) 4 MG tablet Take 1 tablet (4 mg total) by mouth every 8 (eight) hours as needed for nausea or vomiting. 30 tablet 0  . spironolactone (ALDACTONE) 25 MG tablet Take 1 tablet (25 mg total) by mouth daily. 30 tablet 2  . SYMBICORT 160-4.5 MCG/ACT inhaler INHALE 2 PUFFS BY MOUTH TWO TIMES DAILY 30.6 g 1  . Syringe/Needle, Disp, (SYRINGE 3CC/20GX1") 20G X 1" 3 ML MISC Use monthly as directed 50 each 0  . traMADol (ULTRAM) 50 MG tablet TAKE 1 TABLET(50 MG) BY MOUTH EVERY 12 HOURS AS NEEDED 30 tablet 0  . trimethoprim (TRIMPEX) 100 MG tablet Take 100 mg by mouth daily.    . Vitamin D, Ergocalciferol, (DRISDOL) 1.25 MG (50000 UT) CAPS capsule TAKE 1  CAPSULE BY MOUTH  EVERY MONDAY, [place],  AND FRIDAY. 39 capsule 1  . zafirlukast (ACCOLATE) 20 MG tablet Take 1 tablet (20 mg total) by mouth 2 (two) times daily before a meal. 30 tablet 0   No current facility-administered medications on file prior to visit.    Review of Systems:  As per HPI- otherwise negative.   Physical Examination: Vitals:   06/16/19 1425 06/16/19 2011  BP: (!) 106/58 105/60  Pulse: 66   Resp: 17   Temp: (!) 96.1 F (35.6 C)   SpO2: 98%    There were no vitals filed for this  visit. There is no height or weight on file to calculate BMI. Ideal Body Weight:     GEN: WDWN, NAD, Non-toxic, A & O x 3, significant obesity, physically debilitated HEENT: Atraumatic, Normocephalic. Neck supple. No masses, No LAD. Ears and Nose: No external deformity. CV: RRR, No M/G/R. No JVD. No thrill. No extra heart sounds. PULM: CTA B, no wheezes, crackles, rhonchi. No retractions. No resp. distress. No accessory muscle use. EXTR: No c/c/e NEURO uses wheelchair.  Is able to get to her feet with light assistance, can stand with hands on exam table PSYCH: Normally interactive. Conversant. Not depressed or anxious appearing.  Calm demeanor.  Sacral decubitus ulcer is present on both buttocks at the mid upper gluteal cleft.  It does appear improved compared with when I saw her earlier.  I left gel dressing in place on the left side, remove dressing from the right side to examine.  When gel dressing is removed, some areas of the wound began bleeding-mild ooze I cleaned the area with damp gauze, applied Vaseline gauze and a Tegaderm  Groin-the patient appears to have an ingrown hair/ pimple in the left inguinal/pubic area It is soft, displays a white head. It is very difficult to get to this area due to pannus and patient debility.  I have offered to do I&D, the patient prefers to treat conservatively if possible.  BP Readings from Last 3 Encounters:  06/16/19 105/60  06/10/19 124/76  05/31/19 (!) 122/54    Assessment and Plan: Pressure injury of sacral region, stage 2 (HCC)  Ingrown hair  Debilitated patient  Hypotension due to drugs  Here today to recheck pressure ulcer on sacrum.  Appears to be improving.  However, I am concerned that her tissue is being damaged even by the gel dressing.  Suggested that they use Vaseline gauze under gel dressing or any other preferred over-dressing.  Gave some packets to them to take home, encouraged him to buy more online as needed They will  continue dressing changes, area is improving.  They will keep [place] posted  Ingrown hair in groin.  Due to patient factors, that this may be difficult to access in the office for safe I&D procedure.  It is quite small, unlikely to cause serious complications.  I encouraged the patient to try warm compresses, and suspect any pus will drain spontaneously.  If this is not the case, or if she has any symptoms of illness such as fever, she is to seek immediate care  We note that her blood pressure is slightly low today.  She is currently taking 80 of Lasix a.m., 40 p.m.  Her lower extremity edema is under good control.  She is not feeling weak or faint Discussed that I think she may be over-diuresed and to dry.  Recommended that she take 40 of Lasix twice daily only for several days,  can increase if swelling returns.  Moderate medical decision making today This visit occurred during the SARS-CoV-2 public health emergency.  Safety protocols were in place, including screening questions prior to the visit, additional usage of staff PPE, and extensive cleaning of exam room while observing appropriate contact time as indicated for disinfecting solutions.    Signed [name], MD

## 2019-06-16 NOTE — Patient Instructions (Signed)
It was good to see you again today, please keep me posted

## 2019-06-17 ENCOUNTER — Telehealth (INDEPENDENT_AMBULATORY_CARE_PROVIDER_SITE_OTHER): Payer: 59 | Admitting: Cardiology

## 2019-06-17 ENCOUNTER — Encounter: Payer: Self-pay | Admitting: Cardiology

## 2019-06-17 VITALS — BP 107/65 | Ht 64.0 in | Wt 243.0 lb

## 2019-06-17 DIAGNOSIS — I480 Paroxysmal atrial fibrillation: Secondary | ICD-10-CM | POA: Diagnosis not present

## 2019-06-17 DIAGNOSIS — Z6841 Body Mass Index (BMI) 40.0 and over, adult: Secondary | ICD-10-CM

## 2019-06-17 DIAGNOSIS — R600 Localized edema: Secondary | ICD-10-CM | POA: Diagnosis not present

## 2019-06-17 DIAGNOSIS — I10 Essential (primary) hypertension: Secondary | ICD-10-CM | POA: Diagnosis not present

## 2019-06-17 DIAGNOSIS — R609 Edema, unspecified: Secondary | ICD-10-CM

## 2019-06-17 MED ORDER — FUROSEMIDE 40 MG PO TABS: 40.0000 mg | ORAL_TABLET | Freq: Two times a day (BID) | ORAL | 3 refills | Status: DC

## 2019-06-17 NOTE — Patient Instructions (Signed)
Medication Instructions:  DECREASE FUROSEMIDE TO 40 MG TWICE DAILY  *If you need a refill on your cardiac medications before your next appointment, please call your pharmacy*  Lab Work: Your physician recommends that you return for lab work in: [place]  If you have labs (blood work) drawn today and your tests are completely normal, you will receive your results only by: [name] MyChart Message (if you have MyChart) OR . A paper copy in the mail If you have any lab test that is abnormal or we need to change your treatment, we will call you to review the results.  Follow-Up: At [facility], you and your health needs are our priority.  As part of our continuing mission to provide you with exceptional heart care, we have created designated Provider Care Teams.  These Care Teams include your primary Cardiologist (physician) and Advanced Practice Providers (APPs -  Physician Assistants and Nurse Practitioners) who all work together to provide you with the care you need, when you need it.  Your next appointment:   12 month(s)  The format for your next appointment:   Either In Person or Virtual  Provider:   You may see Kirk Ruths, MD or one of the following Advanced Practice Providers on your designated Care Team:    Kerin Ransom, PA-C  [place], [place]  Coletta Memos, [place]

## 2019-06-21 ENCOUNTER — Encounter: Payer: Self-pay | Admitting: Neurology

## 2019-06-21 ENCOUNTER — Telehealth: Payer: Self-pay | Admitting: Neurology

## 2019-06-21 NOTE — Telephone Encounter (Signed)
Pt called given number to [facility]

## 2019-06-21 NOTE — Telephone Encounter (Signed)
Patient left vm about checking on orders for an MRI. She hasn't heard anything to schedule this. Please call her back. Thanks!

## 2019-06-28 ENCOUNTER — Encounter: Payer: Self-pay | Admitting: Psychology

## 2019-06-28 ENCOUNTER — Other Ambulatory Visit: Payer: Self-pay

## 2019-06-28 ENCOUNTER — Ambulatory Visit: Payer: 59 | Admitting: Psychology

## 2019-06-28 ENCOUNTER — Ambulatory Visit: Payer: 59

## 2019-06-28 ENCOUNTER — Ambulatory Visit: Payer: 59 | Admitting: Family

## 2019-06-28 ENCOUNTER — Telehealth: Payer: Self-pay | Admitting: Family

## 2019-06-28 DIAGNOSIS — F329 Major depressive disorder, single episode, unspecified: Secondary | ICD-10-CM | POA: Insufficient documentation

## 2019-06-28 DIAGNOSIS — F411 Generalized anxiety disorder: Secondary | ICD-10-CM | POA: Insufficient documentation

## 2019-06-28 DIAGNOSIS — G3184 Mild cognitive impairment, so stated: Secondary | ICD-10-CM | POA: Diagnosis not present

## 2019-06-28 DIAGNOSIS — F331 Major depressive disorder, recurrent, moderate: Secondary | ICD-10-CM

## 2019-06-28 DIAGNOSIS — R413 Other amnesia: Secondary | ICD-10-CM

## 2019-06-28 HISTORY — DX: Mild cognitive impairment of uncertain or unknown etiology: G31.84

## 2019-06-28 NOTE — Progress Notes (Signed)
   Psychometrician Note   Cognitive testing was administered to [name] by technician [name], B.S. under Jamie supervision of Dr. Christia Reading, Ph.D., licensed psychologist. [name] did not appear overtly distressed by Jamie testing session, per behavioral observation or via self-report to Jamie technician. Rest breaks were offered.    In considering [name]'s current level of functioning, level of presumed impairment, nature of symptoms, emotional and behavioral responses during Jamie interview, level of literacy, and observed level of motivation/effort, a battery of tests was selected and communicated to Jamie psychometrician.   Communication between Jamie psychologist and technician was ongoing throughout Jamie testing session and changes were made as deemed necessary based on Rogers performance on testing, technician observations and additional pertinent factors such as those listed above.   [name] will return within approximately two weeks for an interactive feedback session with Dr. Melvyn Novas at which time [name] test performances, clinical impressions, and treatment recommendations will be reviewed in detail. [name] understands she can contact our office should she require our assistance before this time.  135 minutes were spent face-to-face with [name] administering standardized tests. An additional 30 minutes were spent scoring by Jamie technician. [CPT [id number], [id number]  This note reflects time spent with Jamie psychometrician and does not include test scores or any clinical interpretations made by Dr. Melvyn Novas. Jamie full report will follow in a separate note.

## 2019-06-28 NOTE — Progress Notes (Signed)
NEUROPSYCHOLOGICAL EVALUATION . [facility] Department of Neurology  Reason for Referral:   [name] is a 64 y.o. Caucasian female referred by Ellouise Newer, M.D., to characterize her current cognitive functioning and assist with diagnostic clarity and treatment planning in the context of subjective cognitive decline and several medical comorbidities.  Assessment and Plan:   Clinical Impression(s): [name] pattern of performance is suggestive of prominent difficulties with executive dysfunction, including working memory and  the retrieval of previously learned information. Scores across encoding (i.e., learning) aspects of memory were likewise poor, while processing speed was variable. Performance across basic attention, receptive and expressive language, visuospatial functioning, and consolidation aspects of memory were largely within appropriate normative ranges given premorbid intellectual estimates. [name] largely denied difficulties completing instrumental activities of daily living (ADLs) independently. As such, she meets criteria for a Mild Neurocognitive Disorder (formerly "mild cognitive impairment") at the present time.  The etiology of her weaknesses is unclear and likely multifactorial in nature. Per medical records, [name] has an extensive history of numerous medical ailments (including fibromyalgia), as well as a lengthy current medication list. Additionally, across mood-related questionnaires, she endorsed moderate levels of both anxiety and depression occurring within the past 1-2 weeks. There is the potential that a combination of medical, psychiatric, and polypharmacy factors represent the primary etiology for observed cognitive weaknesses. Despite this, I cannot rule out an underlying neurocognitive disorder based on current test scores. It is important to note that [name]'s profile does not fully align with known conditions at  this time. Based upon age alone, a frontotemporal dementia presentation would appear most likely. Executive dysfunction and trouble with word retrieval can be associated with this condition. However, she did not display other behavioral characteristics common in this condition and imaging was unavailable to assess frontal or temporal lobe atrophy. Outside of some balance/gait difficulties (perhaps better attributed to neuropathy), she does not display behavioral characteristics concerning for a Lewy body dementia presentation presently. Vascular conditions can create difficulties with executive functioning and information retrieval; however, neuroimaging (albeit CT rather than MRI) suggested only mild small vessel ischemia. Alzheimer's disease is also unlikely given the general appropriateness of memory consolidation scores. Continued medical monitoring will be important moving forward.  Recommendations: A repeat neuropsychological evaluation in 24 months is recommended to assess the trajectory of future cognitive decline should it occur. This will also aid in future efforts towards improved diagnostic clarity.  The use of a pillbox for medication organization, rather than placing all her medications in a basket, is highly recommended to ensure adequate medication adherence.   If not already performed, a referral for outpatient physical therapy may be beneficial in addressing ongoing balance concerns.  A combination of medication and psychotherapy has been shown to be most effective at treating symptoms of anxiety and depression. As such, [name] is encouraged to speak with her prescribing physician regarding medication adjustments to optimally manage these symptoms. Likewise, [name] is encouraged to consider engaging in short-term psychotherapy to address symptoms of psychiatric distress. She would benefit from an active and collaborative therapeutic environment, rather than one purely supportive in  nature. Recommended treatment modalities include Cognitive Behavioral Therapy (CBT) or Acceptance and Commitment Therapy (ACT).  Regarding work, [name] described fears surrounding her job security given her reduced productivity. Based upon testing scores, deficits in processing speed and executive functioning are valid and are very likely leading to this decrease in her performance. She is encouraged  to highly structure her working environment to limit external distraction. She is also encouraged to create an easily reviewed list of steps required to complete work-related tasks, that way she has a reference sheet should she get distracted or lose her place. She is encouraged to work on one task at a time to completion before starting another task. She may also wish to discuss her current limitations with her employer to see if her work flow can be adjusted in any way.  [name] is encouraged to attend to lifestyle factors for brain health (e.g., regular physical exercise, good nutrition habits, regular participation in cognitively-stimulating activities, and general stress management techniques), which are likely to have benefits for both emotional adjustment and cognition. In fact, in addition to promoting good general health, regular exercise incorporating aerobic activities (e.g., brisk walking, jogging, cycling, etc.) has been demonstrated to be a very effective treatment for depression and stress, with similar efficacy rates to both antidepressant medication and psychotherapy.  When learning new information, she would benefit from information being broken up into small, manageable pieces. She may also find it helpful to articulate the material in her own words and in a context to promote encoding at the onset of a new task. This material may need to be repeated multiple times to promote encoding.  Memory can be improved using internal strategies such as rehearsal, repetition, chunking, mnemonics,  association, and imagery. External strategies such as written notes in a consistently used memory journal, visual and nonverbal auditory cues such as a calendar on the refrigerator or appointments with alarm, such as on a cell phone, can also help maximize recall.    To address problems with fluctuating attention, she may wish to consider:   -Avoiding external distractions when needing to concentrate   -Limiting exposure to fast paced environments with multiple sensory demands   -Writing down complicated information and using checklists   -Attempting and completing one task at a time (i.e., no multi-tasking)   -Verbalizing aloud each step of a task to maintain focus   -Reducing the amount of information considered at one time  Review of Records:   Ms. Lifsey was seen by [facility] Marland KitchenEllouise Newer, M.D.) on 06/10/2019 for an evaluation of memory loss. At that time, prominent symptoms included word finding difficulties. Ms. Audley noted that these seemed to start following one of her several hospitalizations for kidney stones in July 2020. Her brother reported observing changes as far as back as 3-4 years prior, with cognitive changes present after each kidney-related procedure. Additional symptoms included deficits with short-term memory and processing speed. Additionally, Ms. Nigh described prominent balance concerns, including frequent falls for the past 3-4 years. She described tingling in her fingers, numbness up to her knees for many years, and a burning sensation in the soles of her feet. ADLs were described as largely appropriate. However, her brother noted that all her medications were placed in a basket and likely could be organized better. Dr. Amparo Bristol neurological exam showed hyperreflexia on both upper extremities concerning for myelopathy; she also exhibited evidence for a length-dependent neuropathy. Performance on a brief cognitive screening instrument (SLUMS) was 23/30. Ultimately,  Ms. Corless was referred for a comprehensive neuropsychological evaluation to characterize her cognitive abilities and assist with diagnostic clarity and treatment planning.  Head CT on 12/09/2016 was negative. Head CT on 11/21/2018 revealed mild small vessel white matter disease. Brain MRI was ordered but not completed at the time of this evaluation.   Past Medical  History:  Diagnosis Date  . Abnormal Pap smear    years ago/no biopsy  . Allergic rhinitis   . Aortic atherosclerosis   . Arthritis    back- lower  . Asthma   . Atrial fibrillation 12/2010   OFF XARELTO LAST MONTH DUE TO BLEEDING IN STOOL  . Bursitis of hip left  . Chronic anticoagulation 12/16/2018   CHADS VASC=2 for sex and H/O HTN- she is on Eliquis  . Chronic diastolic (congestive) heart failure    pt unaware of this  . COPD (chronic obstructive pulmonary disease) with chronic bronchitis   . Decreased pulses in feet   . Dysrhythmia    afib, followed by Dr. [facility] Breed   . Edema of both legs   . Endometrial adenocarcinoma 02/21/2013   S/p D and C, has mirena, being followed by GYN   . Esophagitis 02/02/2014   Distal, linear erosions, noted on endoscopy  . Essential hypertension 01/05/2007   Echo Nov June 2020- EF 50-55%, mild LVH, normal LA   . Fatty liver 01/07/2012  . Fibroid 1974   fibroid cyst on left fallopian tube  . Fibromyalgia   . Foot ulcer    AREA HEALED RIGHT FOOT  . GERD (gastroesophageal reflux disease)   . Headache    occasional sinus headache   . Hepatomegaly   . History of blood transfusion JULY 2015  . History of cardiomegaly 12/06/2010   Noted on CT  . History of E. coli septicemia   . History of papillary thyroid carcinoma 04/07/2011  . Hypocalcemia 12/10/2016  . Hypokalemia 12/10/2016  . Hypoparathyroidism 01/07/2011  . Hypothyroidism   . Iron deficiency anemia secondary to malabsorption  05/10/2007   Qualifier: Diagnosis of  By: Wynona Luna    . Kidney stone 10-Oct-2015   passed on their  own  . Leukocytosis 03/24/2009   cta cheQualifier: Diagnosis of  By: Wynona Luna    . Morbid obesity   . MRSA infection   . Nephrolithiasis 2/16, 9/16   SEES DR Risa Grill  . Osteomyelitis 06/14/2014  . PAF (paroxysmal atrial fibrillation) 02/05/2011   Documented 2012- NSR since.  On chronic anticoagulation.  . Pernicious anemia 02/20/2014   followed by Debbrah Alar  . Pneumonia   . PONV (postoperative nausea and vomiting)   . Pyelonephritis 11/24/2018  . Rash    thighs and back  . Seizures    infancy secondary to fever  . Staph aureus infection   . Thoracic spondylosis 12/06/2010   Noted on CT  . Uterine cancer 2014   Mirena IUD  . UTI (urinary tract infection) 12/13/2018  . Vitamin B 12 deficiency   . Vitamin D deficiency 05/10/2007   Qualifier: Diagnosis of  By: Wynona Luna     Past Surgical History:  Procedure Laterality Date  . AMPUTATION Right 05/18/2014   Procedure: RIGHT FIFTH RAY AMPUTATION FOOT;  Surgeon: Wylene Simmer, MD;  Location: [place];  Service: Orthopedics;  Laterality: Right;  . APPENDECTOMY    . BUNIONECTOMY     bilateral  . COLONOSCOPY WITH PROPOFOL N/A 02/02/2014   Procedure: COLONOSCOPY WITH PROPOFOL;  Surgeon: Irene Shipper, MD;  Location: [facility];  Service: Endoscopy;  Laterality: N/A;  . cyst on ovary removed     . CYSTOSCOPY W/ RETROGRADES  03/03/2012   Procedure: CYSTOSCOPY WITH RETROGRADE PYELOGRAM;  Surgeon: Bernestine Amass, MD;  Location: [facility];  Service: Urology;  Laterality: Bilateral;  . CYSTOSCOPY W/  URETERAL STENT PLACEMENT Right 11/25/2017   Procedure: CYSTOSCOPY WITH RETROGRADE RIGHT URETERAL STENT PLACEMENT;  Surgeon: Franchot Gallo, MD;  Location: [facility];  Service: Urology;  Laterality: Right;  . CYSTOSCOPY W/ URETERAL STENT PLACEMENT Right 01/15/2018   Procedure: RIGHT STENT EXCHANGE;  Surgeon: Ardis Hughs, MD;  Location: [facility];  Service: Urology;  Laterality: Right;  . CYSTOSCOPY WITH RETROGRADE PYELOGRAM,  URETEROSCOPY AND STENT PLACEMENT Left 08/25/2012   Procedure: CYSTOSCOPY WITH RETROGRADE PYELOGRAM, URETEROSCOPY;  Surgeon: Bernestine Amass, MD;  Location: [facility];  Service: Urology;  Laterality: Left;  . CYSTOSCOPY WITH STENT PLACEMENT Left 01/15/2018   Procedure: LEFT STENT PLACEMENT;  Surgeon: Ardis Hughs, MD;  Location: [facility];  Service: Urology;  Laterality: Left;  . CYSTOSCOPY WITH STENT PLACEMENT Right 01/22/2018   Procedure: RIGHT STENT REMOVAL;  Surgeon: Ardis Hughs, MD;  Location: [facility];  Service: Urology;  Laterality: Right;  . CYSTOSCOPY/URETEROSCOPY/HOLMIUM LASER/STENT PLACEMENT Right 12/23/2017   Procedure: RIGHT URETEROSCOPY STONE REMOVAL, HOLMIUM LASER, RIGHT /STENT EXCHANGE;  Surgeon: Ardis Hughs, MD;  [facility];  Service: Urology;  Laterality: Right;  . CYSTOSCOPY/URETEROSCOPY/HOLMIUM LASER/STENT PLACEMENT Left 01/22/2018   Procedure: LEFT URETEROSCOPY STONE REMOVAL HOLMIUM LASER LEFT STENT Freddi Starr;  Surgeon: Ardis Hughs, MD;  Location: [facility];  Service: Urology;  Laterality: Left;  . DILATION AND CURETTAGE OF UTERUS N/A 02/21/2013   Procedure: DILATATION AND CURETTAGE;  Surgeon: Lyman Speller, MD;  Location: [facility];  Service: Gynecology;  Laterality: N/A;  . DILATION AND CURETTAGE OF UTERUS N/A 04/09/2015   Procedure: Frederic IUD removal;  Surgeon: Megan Salon, MD;  Location: [facility];  Service: Gynecology;  Laterality: N/A;  Patient weight 307lbs  . ESOPHAGOGASTRODUODENOSCOPY (EGD) WITH PROPOFOL N/A 02/02/2014   Procedure: ESOPHAGOGASTRODUODENOSCOPY (EGD) WITH PROPOFOL;  Surgeon: Irene Shipper, MD;  Location: [facility];  Service: Endoscopy;  Laterality: N/A;  . GASTRIC BYPASS  1974  . HOLMIUM LASER APPLICATION Left 0000000   Procedure: HOLMIUM LASER APPLICATION;  Surgeon: Bernestine Amass, MD;  Location: [facility];  Service: Urology;  Laterality: Left;  . LITHOTRIPSY  03/2012  . LITHOTRIPSY  2/16  .  THYROIDECTOMY  05/15/2010  . TONSILLECTOMY AND ADENOIDECTOMY    . URETEROSCOPY  03/03/2012   Procedure: URETEROSCOPY;  Surgeon: Bernestine Amass, MD;  Location: [facility];  Service: Urology;  Laterality: Left;  . URETEROSCOPY WITH HOLMIUM LASER LITHOTRIPSY Bilateral 01/15/2018   Procedure: CYSTOSCOPY/BILATERAL URETEROSCOPY WITH HOLMIUM LASER LITHOTRIPSY STONE REMOVAL;  Surgeon: Ardis Hughs, MD;  Location: [facility];  Service: Urology;  Laterality: Bilateral;    Current Outpatient Medications:  .  acetaminophen (TYLENOL) 325 MG tablet, Take 2 tablets (650 mg total) by mouth every 6 (six) hours as needed for mild pain or moderate pain., Disp: 60 tablet, Rfl: 0 .  albuterol (PROVENTIL HFA;VENTOLIN HFA) 108 (90 Base) MCG/ACT inhaler, Inhale 2 puffs into the lungs every 6 (six) hours as needed for wheezing or shortness of breath., Disp: 1 Inhaler, Rfl: 5 .  albuterol (PROVENTIL) (2.5 MG/3ML) 0.083% nebulizer solution, Take 3 mLs (2.5 mg total) by nebulization every 6 (six) hours as needed for wheezing or shortness of breath., Disp: 150 mL, Rfl: 1 .  calcitRIOL (ROCALTROL) 0.5 MCG capsule, Take 2 mcg by mouth daily. , Disp: , Rfl:  .  cyanocobalamin (,VITAMIN B-12,) 1000 MCG/ML injection, Inject 1 mL (1,000 mcg total) into the skin every 30 (thirty) days., Disp: 3 mL, Rfl: 4 .  desoximetasone (TOPICORT) 0.05 % cream, Apply topically 2 (two) times daily as needed., Disp: 60 g, Rfl: 0 .  diclofenac sodium (VOLTAREN) 1 % GEL, Apply 2 g topically 4 (four) times daily., Disp: 100 g, Rfl: 3 .  diltiazem (CARDIZEM CD) 120 MG 24 hr capsule, Take 1 capsule (120 mg total) by mouth daily., Disp: 90 capsule, Rfl: 1 .  diphenhydrAMINE (BENADRYL) 25 mg capsule, Take 25 mg by mouth every 6 (six) hours as needed for itching. , Disp: , Rfl:  .  EPINEPHrine (EPIPEN 2-PAK) 0.3 mg/0.3 mL IJ SOAJ injection, Inject 0.3 mg into the muscle once as needed for anaphylaxis (severe allergic reaction). , Disp: , Rfl:  .  furosemide  (LASIX) 40 MG tablet, Take 1 tablet (40 mg total) by mouth 2 (two) times daily., Disp: 270 tablet, Rfl: 3 .  hydrocortisone cream 1 %, Apply topically 4 (four) times daily as needed for itching., Disp: 30 g, Rfl: 0 .  levocetirizine (XYZAL) 5 MG tablet, TAKE 1 TABLET BY MOUTH  EVERY EVENING, Disp: 90 tablet, Rfl: 3 .  levothyroxine (SYNTHROID, LEVOTHROID) 100 MCG tablet, Take 400 mcg by mouth daily before breakfast. , Disp: , Rfl:  .  Melatonin 5 MG TABS, Take 5 mg by mouth at bedtime as needed (for sleep). , Disp: , Rfl:  .  montelukast (SINGULAIR) 10 MG tablet, Take 1 tablet (10 mg total) by mouth at bedtime., Disp: 30 tablet, Rfl: 3 .  nystatin (MYCOSTATIN/NYSTOP) powder, Apply topically 2 (two) times daily., Disp: 45 g, Rfl: 1 .  nystatin cream (MYCOSTATIN), Apply 1 application topically 2 (two) times daily., Disp: 30 g, Rfl: 2 .  omeprazole (PRILOSEC) 40 MG capsule, TAKE 1 CAPSULE BY MOUTH  DAILY, Disp: 90 capsule, Rfl: 1 .  ondansetron (ZOFRAN) 4 MG tablet, Take 1 tablet (4 mg total) by mouth every 8 (eight) hours as needed for nausea or vomiting., Disp: 30 tablet, Rfl: 0 .  spironolactone (ALDACTONE) 25 MG tablet, Take 1 tablet (25 mg total) by mouth daily., Disp: 30 tablet, Rfl: 2 .  SYMBICORT 160-4.5 MCG/ACT inhaler, INHALE 2 PUFFS BY MOUTH TWO TIMES DAILY, Disp: 30.6 g, Rfl: 1 .  Syringe/Needle, Disp, (SYRINGE 3CC/20GX1") 20G X 1" 3 ML MISC, Use monthly as directed, Disp: 50 each, Rfl: 0 .  traMADol (ULTRAM) 50 MG tablet, TAKE 1 TABLET(50 MG) BY MOUTH EVERY 12 HOURS AS NEEDED, Disp: 30 tablet, Rfl: 0 .  trimethoprim (TRIMPEX) 100 MG tablet, Take 100 mg by mouth daily., Disp: , Rfl:  .  Vitamin D, Ergocalciferol, (DRISDOL) 1.25 MG (50000 UT) CAPS capsule, TAKE 1 CAPSULE BY MOUTH  EVERY MONDAY, WEDNESDAY,  AND FRIDAY., Disp: 39 capsule, Rfl: 1 .  zafirlukast (ACCOLATE) 20 MG tablet, Take 1 tablet (20 mg total) by mouth 2 (two) times daily before a meal., Disp: 30 tablet, Rfl: 0  Clinical  Interview:   Cognitive Symptoms: Decreased short-term memory: Endorsed. Ms. Silvis described difficulties remembering names of familiar individuals, details of previous conversations, and phone numbers. She also reported misplacing items around her home. These difficulties were said to be present for the past 1-2 years (her niece described the past 3-4 years) and have exhibited a gradual decline over that time. Decreased long-term memory: Denied. Decreased attention/concentration: Endorsed. Difficulties included trouble maintaining her focus and increased ease of distractibility. Reduced processing speed: Endorsed. Difficulties with executive functions: Endorsed. Prominent difficulties surround her ability to follow complex, multi-step commands or processes. Ms. Uffelman noted commonly getting distracted and then  forgetting where she left off when returning to her original task. She additionally described trouble with organization, complex planning, and impulsivity (i.e., increased online shopping behaviors). Her niece noted very mild personality changes in that Ms. Cashin seems more irritable and/or withdrawn at times.  Difficulties with emotion regulation: Denied. Difficulties with receptive language: Endorsed. Difficulties were attributed to deficits in processing speed and people speaking too quickly. Difficulties with word finding: Endorsed. These were observed in both speaking and writing practices. A decline in spelling was also described. Words were said to generally come to Ms. Dedeaux with time.  Decreased visuoperceptual ability: Endorsed. She alluded to some difficulties with depth perception, complicated by mirror placement in her bathroom.   Difficulties completing ADLs: Largely denied. However, Ms. Kast did acknowledge growing difficulties organizing and managing her many medications. These are currently placed in a basket, where she must sift through to identify what she needs to take.  Her niece stated that they were considering a pillbox in the future. No difficulties with personal finances were endorsed. Ms. Broaddus has not driven since the summer of 2019 due to weakness in her legs making her fear that she would be unable to brake.   Additional Medical History: History of traumatic brain injury/concussion: Denied. History of stroke: Denied. History of seizure activity: Denied. History of known exposure to toxins: Denied. Symptoms of chronic pain: Ms. Kientz has a history of fibromyalgia. She described pain symptoms as manageable currently, noting that she only takes medications when symptoms reach "extreme" levels.  Experience of frequent headaches/migraines: Endorsed. She described the presence of sinus headaches occurring 1-2 times per week during the winter months. She takes over-the-counter allergy pills to address these symptoms.  Frequent instances of dizziness/vertigo: Endorsed. Brief dizzy spells were said to occur 1-2 times per week and cause her to need to sit down, else she would risk falling.   Sensory changes: Ms. Armentrout stated that she has had her glasses prescription changed twice in the past year, but that her current glasses work well. She denied other sensory changes/difficulties (e.g., hearing, taste, or smell) outside of a change in her taste preferences.  Balance/coordination difficulties: Endorsed. She described significantly worsening balance symptoms, present for at least the past 2 years. She described a history of several falls due to a combination of weakness in her legs and tripping over things in her environment. She denied typically falling in one direction; rather, this can vary depending on the cause for her fall or what is in her environment. She uses a rolling walker to ambulate at home and relies on a wheelchair for physician visits where she is unclear how much moving she will be required to do. Other motor difficulties: Denied.  Sleep  History: Estimated hours obtained each night: 9 hours. Difficulties falling asleep: Denied. Difficulties staying asleep: Denied. Feels rested and refreshed upon awakening: Endorsed.  History of snoring: Endorsed. History of waking up gasping for air: Denied. Witnessed breath cessation while asleep: Denied.  History of vivid dreaming: Denied. Excessive movement while asleep: Denied. Instances of acting out her dreams: Denied.  Psychiatric/Behavioral Health History: Depression: Endorsed. While Ms. Umphenour described her mood as "usually pretty good," she did acknowledge more acute symptoms of depression. These were attributed to isolation stemming from the COVID-19 pandemic, as well as her subjective cognitive dysfunction and loss of mobility. She denied medication interventions to address these symptoms, stating that she relies on music and visits from her family as her main sources of stress reduction.  Current or remote suicidal ideation, intent, or plan was denied. Anxiety: Endorsed. She expressed anxiety surrounding her current health and the presence of cognitive dysfunction. She also expressed anxiety surrounding her current job status, stating that she is constantly expecting to be fired given experienced productivity declines.  Mania: Denied. Trauma History: Denied. Visual/auditory hallucinations: Denied. She did acknowledge one instance of visual hallucinations (i.e., seeing a picture on the wall of an older lady in her 66s with a young boy on her lap). However, this occurred while in the hospital either before or after undergoing a surgical procedure for kidney stones. No other hallucinations were reported. Delusional thoughts: Denied.  Tobacco: Denied. Alcohol: Ms. Kampman denied alcohol consumption over the past couple years. She also denied a history of problematic alcohol abuse or dependence.  Recreational drugs: Denied. Caffeine: 2 cups of coffee in the morning, as well as an  occasional tea or Coke throughout the afternoon.  Family History: Problem Relation Age of Onset  . Hypertension Father   . Diabetes Father   . Lung cancer Father   . Heart attack Father        MI at age 76  . Hypertension Mother   . Hyperthyroidism Mother   . Heart disease Mother   . Heart attack Mother        MI at age 40  . Asthma Brother   . Hypertension Brother        younger  . Heart disease Brother        older  . Dementia Paternal Aunt        Unspecified type; symptoms emerged with advanced age  . Colon cancer Neg Hx   . Esophageal cancer Neg Hx   . Stomach cancer Neg Hx   . Kidney disease Neg Hx   . Liver disease Neg Hx    This information was confirmed by Ms. Burchfield.  Academic/Vocational History: Highest level of educational attainment: 14 years. Ms. Hauswirth completed high school, as well as an additional 2 years of college. She described herself as a good (A/B) student in academic settings. Calculus was noted as a relative weakness. History of developmental delay: Denied. History of grade repetition: Endorsed. She reported having to repeat Kindergarten for an unknown reason. Enrollment in special education courses: Denied. History of LD/ADHD: Denied.  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 She expressed concerns surrounding her current job security, stating that cognitive deficits (namely processing speed) have reduced her productivity and led to negative performance reviews.   Evaluation Results:   Behavioral Observations: Ms. Hark was accompanied by her niece, arrived to her appointment on time, and was appropriately dressed and groomed. She arrived in a wheelchair so gait could not be assessed. Gross motor functioning appeared intact upon informal observation and no abnormal movements (e.g., tremors) were noted. Her affect was generally relaxed and positive, but did range appropriately given the subject being discussed  during the clinical interview or the task at hand during testing procedures. Spontaneous speech was fluent and word finding difficulties were not observed during the clinical interview or testing procedures. Thought processes were coherent, organized, and normal in content. During testing, sustained attention was appropriate. Task engagement was adequate and she persisted when challenged. Overall, Ms. Balash was cooperative with the clinical interview and subsequent testing procedures.   Adequacy of Effort: The validity of neuropsychological testing is limited by the extent to which the individual being tested may be assumed to have  exerted adequate effort during testing. Ms. Sarcia expressed her intention to perform to the best of her abilities and exhibited adequate task engagement and persistence. Scores across stand-alone and embedded performance validity measures were variable but largely within expectation. As such, the results of the current evaluation are believed to be a valid representation of Ms. Sava's current cognitive functioning.  Test Results: Ms. Rais was fully oriented at the time of the current evaluation.  Intellectual abilities based upon educational and vocational attainment were estimated to be in the average range. Premorbid abilities were estimated to be within the below average range based upon a single-word reading test.   Processing speed was variable, ranging from the exceptionally low to below average normative ranges. Basic attention was below average. More complex attention (e.g., working memory) was exceptionally low. Executive functioning was variable but represented a noted weakness across the current evaluation.  Assessed receptive language abilities were below average. Assessed expressive language (e.g., verbal fluency and confrontation naming) was below average to average.     Assessed visuospatial/visuoconstructional abilities were below average to average.  Points were lost across her drawing of a complex figure (as well as the immediate recall trial) due to a very disorganized, poorly planned, piecemeal approach to figure composition rather than frank visuospatial deficits.   Learning (i.e., encoding) of novel verbal and visual information was well below average to below average. Spontaneous delayed recall (i.e., retrieval) of previously learned information was also well below average to below average. Retention rates were 62% across a story learning task, 38-63% across a list learning task, 71% across a shape learning task, and 40% across a complex figure drawing task. Performance across recognition tasks was largely appropriate, suggesting evidence for information consolidation. A relative weakness was exhibited across a list learning task.   Results of emotional screening instruments suggested that recent symptoms of generalized anxiety were in the moderate range, while symptoms of depression were also within the moderate range. A screening instrument assessing recent sleep quality suggested the presence of minimal sleep dysfunction.  Tables of Scores:   Note: This summary of test scores accompanies the interpretive report and should not be considered in isolation without reference to the appropriate sections in the text. Descriptors are based on appropriate normative data and may be adjusted based on clinical judgment. The terms "impaired" and "within normal limits (WNL)" are used when a more specific level of functioning cannot be determined.       Effort Testing:   DESCRIPTOR       ACS Word Choice: --- --- Within Expectation  Dot Counting Test: --- --- Below Expectation  CVLT-III Forced Choice Recognition: --- --- Within Expectation  BVMT-R Retention Percentage: --- --- Within Expectation       Orientation:      Raw Score Percentile   NAB Orientation, Form 1 29/29 --- ---       Intellectual Functioning:           Standard Score Percentile    Test of Premorbid Functioning: 87 19 Below Average       Memory:          Wechsler Memory Scale (WMS-IV):                       Raw Score (Scaled Score) Percentile     Logical Memory I 13/50 (4) 2 Well Below Average    Logical Memory II 8/50 (4) 2 Well Below Average    Logical Memory Recognition  24/30 26-50 Average       [place] Verbal Learning Test (CVLT-III), Standard Form: Raw Score (Scaled/Standard Score) Percentile     Total Trials 1-5 27/80 (76) 5 Well Below Average    List B 4/16 (9) 37 Average    Short-Delay Free Recall 3/16 (5) 5 Well Below Average    Short-Delay Cued Recall 6/16 (6) 9 Below Average    Long Delay Free Recall 3/16 (5) 5 Well Below Average    Long Delay Cued Recall 5/16 (5) 5 Well Below Average      Recognition Hits 12/16 (6) 9 Below Average      False Positive Errors 9 (6) 9 Below Average       Brief Visuospatial Memory Test (BVMT-R), Form 1: Raw Score (T Score) Percentile     Total Trials 1-3 18/36 (42) 21 Below Average    Delayed Recall 5/12 (34) 5 Well Below Average    Recognition Discrimination Index 6 >16 Within Normal Limits      Recognition Hits 6/6 >16 Within Normal Limits      False Positive Errors 0 >16 Within Normal Limits       Attention/Executive Function:          Trail Making Test (TMT): Raw Score (T Score) Percentile     Part A 40 secs.,  0 errors (40) 16 Below Average    Part B 131 secs.,  3 errors (33) 5 Well Below Average        Scaled Score Percentile   WAIS-IV Coding: 7 16 Below Average       NAB Attention Module, Form 1: T Score Percentile     Digits Forward 40 16 Below Average    Digits Backwards 29 2 Exceptionally Low       D-KEFS Color-Word Interference Test: Raw Score (Scaled Score) Percentile     Color Naming 46 secs. (4) 2 Well Below Average    Word Reading 38 secs. (3) 1 Exceptionally Low    Inhibition 115 secs. (2) <1 Exceptionally Low      Total Errors 8 errors (3) 1 Exceptionally Low     Inhibition/Switching 117 secs. (4) 2 Well Below Average      Total Errors 5 errors (8) 25 Average       D-KEFS Verbal Fluency Test: Raw Score (Scaled Score) Percentile     Letter Total Correct 21 (6) 9 Below Average    Category Total Correct 25 (6) 9 Below Average    Category Switching Total Correct 9 (6) 9 Below Average    Category Switching Accuracy 7 (6) 9 Below Average      Total Set Loss Errors 2 (10) 50 Average      Total Repetition Errors 1 (12) 75 Above Average       D-KEFS 20 Questions Test: Scaled Score Percentile     Total Weighted Achievement Score 9 37 Average    Initial Abstraction Score 6 9 Below Average       Wisconsin Card Sorting Test [place]): Raw Score Percentile     Categories (trials) 0 (64) 2-5 Well Below Average    Total Errors 40 2 Exceptionally Low    Perseverative Errors 25 3 Well Below Average    Non-Perseverative Errors 15 9 Below Average    Failure to Maintain Set 0 --- ---       Language:          NAB Language Module, Form 2: T Score Percentile  Auditory Comprehension 39 14 Below Average    Naming 31/31 (56) 73 Average       Visuospatial/Visuoconstruction:      Raw Score Percentile   Clock Drawing: 10/10 --- Within Normal Limits       NAB Spatial Module, Form 2: T Score Percentile     Figure Drawing Copy 45 31 Average    Figure Drawing Immediate Recall 30 2 Well Below Average        Scaled Score Percentile   WAIS-IV Block Design: 9 37 Average  WAIS-IV Visual Puzzles: 7 16 Below Average       Mood and Personality:      Raw Score Percentile   Beck Depression Inventory - II: 22 --- Moderate  PROMIS Anxiety Questionnaire: 20 --- Moderate       Additional Questionnaires:      Raw Score Percentile   PROMIS Sleep Disturbance Questionnaire: 13 --- None to Slight   Informed Consent and Coding/Compliance:   Ms. Schelle was provided with a verbal description of the nature and purpose of the present neuropsychological evaluation. Also reviewed  were the foreseeable risks and/or discomforts and benefits of the procedure, limits of confidentiality, and mandatory reporting requirements of this provider. The patient was given the opportunity to ask questions and receive answers about the evaluation. Oral consent to participate was provided by the patient.   This evaluation was conducted by Christia Reading, Ph.D., licensed clinical neuropsychologist. Ms. Lipka completed a comprehensive clinical interview with Dr. Melvyn Novas, billed as one unit [phone], and 165 minutes of cognitive testing and scoring, billed as one unit [phone] and five additional units [id number]. Psychometrist Cruzita Lederer, B.S., assisted Dr. Melvyn Novas with test administration and scoring procedures. As a separate and discrete service, Dr. Melvyn Novas spent a total of 180 minutes in interpretation and report writing billed as one unit [phone] and two units 96133.

## 2019-06-28 NOTE — Telephone Encounter (Signed)
Patient is out needs refilled    diltiazem (CARDIZEM CD) 120 MG 24 hr capsule [id number] ENDED    zafirlukast (ACCOLATE) 20 MG tablet [id number]    Patient states she did call pharmacy   [id number] - [place], [place] - [address]  [address] [place], [place] [address]  Phone:  [phone] Fax:  [phone]  DEA #:  [id number]

## 2019-06-29 ENCOUNTER — Other Ambulatory Visit: Payer: Self-pay

## 2019-06-29 MED ORDER — DILTIAZEM HCL ER COATED BEADS 120 MG PO CP24: 120.0000 mg | ORAL_CAPSULE | Freq: Every day | ORAL | 1 refills | Status: DC

## 2019-06-29 NOTE — Telephone Encounter (Signed)
Prescription for diltiazem was refill at [facility].  Prescription  for Accolate is being process by patient's mail order. Patient is aware of this.

## 2019-07-05 ENCOUNTER — Encounter: Payer: Self-pay | Admitting: Psychology

## 2019-07-05 ENCOUNTER — Ambulatory Visit (INDEPENDENT_AMBULATORY_CARE_PROVIDER_SITE_OTHER): Payer: 59 | Admitting: Psychology

## 2019-07-05 ENCOUNTER — Other Ambulatory Visit: Payer: Self-pay

## 2019-07-05 DIAGNOSIS — F331 Major depressive disorder, recurrent, moderate: Secondary | ICD-10-CM | POA: Diagnosis not present

## 2019-07-05 DIAGNOSIS — G3184 Mild cognitive impairment, so stated: Secondary | ICD-10-CM | POA: Diagnosis not present

## 2019-07-05 NOTE — Progress Notes (Signed)
   Neuropsychology Feedback Session Tillie Rung. [facility] Department of Neurology  Reason for Referral:   MADISSON WALKENHORST a 64 y.o. Caucasian female referred by Ellouise Newer, M.D.,to characterize hercurrent cognitive functioning and assist with diagnostic clarity and treatment planning in the context of subjective cognitive decline and several medical comorbidities.  Feedback:   Ms. Ciccone completed a comprehensive neuropsychological evaluation on 06/28/2019. Please refer to that encounter for the full report and recommendations. Briefly, results suggested prominent difficulties with executive dysfunction, including working memory and  the retrieval of previously learned information. Scores across encoding (i.e., learning) aspects of memory were likewise poor, while processing speed was variable. The etiology of her weaknesses is unclear and likely multifactorial in nature. Per medical records, Ms. Rothermich has an extensive history of numerous medical ailments (including fibromyalgia), as well as a lengthy current medication list. Additionally, across mood-related questionnaires, she endorsed moderate levels of both anxiety and depression occurring within the past 1-2 weeks. There is the potential that a combination of medical, psychiatric, and polypharmacy factors represent the primary etiology for observed cognitive weaknesses. Despite this, I cannot rule out an underlying neurocognitive disorder based on current test scores. It is important to note that Ms. Salls's profile does not fully align with known conditions at this time.  Ms. Vanosten was accompanied by her brother during the current telephone call. Content of the current session focused on the results of her neuropsychological evaluation. Ms. Pilat and her brother were given the opportunity to ask questions and their questions were answered. They were encouraged to reach out should additional questions arise.     23  minutes were spent conducting the current feedback session with Ms. Rosendahl, billed as one unit [phone]

## 2019-07-10 ENCOUNTER — Encounter: Payer: Self-pay | Admitting: Family

## 2019-07-10 ENCOUNTER — Other Ambulatory Visit: Payer: Self-pay | Admitting: Family

## 2019-07-11 MED ORDER — ZAFIRLUKAST 20 MG PO TABS: 20.0000 mg | ORAL_TABLET | Freq: Two times a day (BID) | ORAL | 0 refills | Status: DC

## 2019-07-11 MED ORDER — SPIRONOLACTONE 25 MG PO TABS: 25.0000 mg | ORAL_TABLET | Freq: Every day | ORAL | 2 refills | Status: DC

## 2019-07-15 ENCOUNTER — Other Ambulatory Visit: Payer: 59

## 2019-07-19 ENCOUNTER — Encounter: Payer: Self-pay | Admitting: Family

## 2019-07-20 ENCOUNTER — Other Ambulatory Visit: Payer: Self-pay

## 2019-07-20 MED ORDER — ZAFIRLUKAST 20 MG PO TABS: 20.0000 mg | ORAL_TABLET | Freq: Two times a day (BID) | ORAL | 0 refills | Status: DC

## 2019-07-20 MED ORDER — SPIRONOLACTONE 25 MG PO TABS: 25.0000 mg | ORAL_TABLET | Freq: Every day | ORAL | 2 refills | Status: DC

## 2019-07-20 MED ORDER — CYANOCOBALAMIN 1000 MCG/ML IJ SOLN: 1000.0000 ug | INTRAMUSCULAR | 4 refills | Status: DC

## 2019-07-21 ENCOUNTER — Ambulatory Visit: Payer: 59 | Admitting: Podiatry

## 2019-07-29 ENCOUNTER — Ambulatory Visit: Payer: 59 | Admitting: Family

## 2019-08-02 ENCOUNTER — Ambulatory Visit: Payer: 59 | Admitting: Podiatry

## 2019-08-04 ENCOUNTER — Telehealth: Payer: Self-pay | Admitting: Family

## 2019-08-04 DIAGNOSIS — D649 Anemia, unspecified: Secondary | ICD-10-CM

## 2019-08-04 NOTE — Telephone Encounter (Signed)
Patient is requesting a transition of care from Jamie Alar  NP to [name] MD Please advise .

## 2019-08-04 NOTE — Telephone Encounter (Signed)
Advise patient. Patient understood

## 2019-08-04 NOTE — Telephone Encounter (Signed)
Hi- please let her know that I am sorry but I do not take any transfers from inside my office [place]

## 2019-08-05 ENCOUNTER — Encounter: Payer: Self-pay | Admitting: Family

## 2019-08-05 ENCOUNTER — Ambulatory Visit: Payer: 59 | Admitting: Family

## 2019-08-05 ENCOUNTER — Other Ambulatory Visit: Payer: Self-pay

## 2019-08-05 VITALS — BP 108/57 | HR 50 | Temp 97.3°F | Resp 16

## 2019-08-05 DIAGNOSIS — R2681 Unsteadiness on feet: Secondary | ICD-10-CM | POA: Diagnosis not present

## 2019-08-05 DIAGNOSIS — R0789 Other chest pain: Secondary | ICD-10-CM

## 2019-08-05 DIAGNOSIS — I10 Essential (primary) hypertension: Secondary | ICD-10-CM | POA: Diagnosis not present

## 2019-08-05 DIAGNOSIS — S31000D Unspecified open wound of lower back and pelvis without penetration into retroperitoneum, subsequent encounter: Secondary | ICD-10-CM

## 2019-08-05 NOTE — Patient Instructions (Addendum)
Please go to the ER/call [phone] if you develop recurrent chest pain. We will work on trying to get you scheduled for evening outpatient physical therapy. You should be contacted about scheduling your follow up with Dr. Stanford Breed for further evaluation of your chest pain.

## 2019-08-05 NOTE — Progress Notes (Signed)
Subjective:    Patient ID: [name], female    DOB: April 16, 1956, 64 y.o.   MRN: [id number]  HPI  Patient is a 64 yr old female who presents today for follow up. She is brought today by her brother.  I last saw her back in January.  Following her visit with me she met with Dr. Lorelei Pont.  At that visit she had an ingrown hair in the groin area, soft blood pressure (Lasix was changed from 80 a.m. and 40 p.m. to 40 a.m. and 40 p.m.).  Sacral pressure ulcer was felt to be improving somewhat at that time.   HTN-she reports that she is taking 40 mg of Lasix twice daily most days. Reports that she does have occasional dizziness.  BP Readings from Last 3 Encounters:  08/05/19 (!) 108/57  06/17/19 107/65  06/16/19 105/60   Pressure ulcer-she reports that her sister-in-law is continuing to change her gel dressing every 3 days.  She feels like it is improving.  She is staying in the recliner day and night.  Her brother states he does not feel that she can safely get in and out of a bed to change position.  Patient states that if she gets stronger than she feels like she could safely do this.  We again discussed physical therapy.  We have been unsuccessful finding a home health company willing to go to her home to do nursing care or physical therapy.  We did arrange an evening PT session outpatient so her family could drive her.  Patient later declined.  She states she is now ready to move forward with this plan.  She also had a formal neuropsych eval and met criteria for the criteria for mild neurocognitive disorder.  At that time she also endorsed moderate levels of anxiety and depression in the 2 weeks prior to that appointment.  He recommended a repeat neuropsych eval in 24 months.  Was also recommended that she discuss anxiety and depression with her PCP.  The patient also reports that she had a 3-day history of left-sided chest pain.  This occurred with activity and at rest.  Pain was  nonradiating.  Denied associated jaw pain.  She denied associated shortness of breath or palpitations.  She reports that she has not had pain since yesterday.  Review of Systems See HPI  Past Medical History:  Diagnosis Date  . Abnormal Pap smear    years ago/no biopsy  . Allergic rhinitis   . Aortic atherosclerosis   . Arthritis    back- lower  . Asthma   . Atrial fibrillation 12/2010   OFF XARELTO LAST MONTH DUE TO BLEEDING IN STOOL  . Bursitis of hip left  . Chronic anticoagulation 12/16/2018   CHADS VASC=2 for sex and H/O HTN- she is on Eliquis  . Chronic diastolic (congestive) heart failure    pt unaware of this  . COPD (chronic obstructive pulmonary disease) with chronic bronchitis   . Decreased pulses in feet   . Dysrhythmia    afib, followed by Dr. Stanford Breed   . Edema of both legs   . Endometrial adenocarcinoma 02/21/2013   S/p D and C, has mirena, being followed by GYN   . Esophagitis 02/02/2014   Distal, linear erosions, noted on endoscopy  . Essential hypertension 01/05/2007   Echo Nov June 2020- EF 50-55%, mild LVH, normal LA   . Fatty liver 01/07/2012  . Fibroid 1974   fibroid cyst on left fallopian tube  .  Fibromyalgia   . Foot ulcer    AREA HEALED RIGHT FOOT  . Generalized anxiety disorder   . GERD (gastroesophageal reflux disease)   . Headache    occasional sinus headache   . Hepatomegaly   . History of blood transfusion JULY 2015  . History of cardiomegaly 12/06/2010   Noted on CT  . History of E. coli septicemia   . History of papillary thyroid carcinoma 04/07/2011  . Hypocalcemia 12/10/2016  . Hypokalemia 12/10/2016  . Hypoparathyroidism 01/07/2011  . Hypothyroidism   . Iron deficiency anemia secondary to malabsorption  05/10/2007   Qualifier: Diagnosis of  By: Wynona Luna    . Kidney stone 10-11-15   passed on their own  . Leukocytosis 03/24/2009   cta cheQualifier: Diagnosis of  By: Wynona Luna    . Major depressive disorder   . Mild  neurocognitive disorder 06/28/2019  . Morbid obesity   . MRSA infection   . Nephrolithiasis 2/16, 9/16   SEES DR Risa Grill  . Osteomyelitis 06/14/2014  . PAF (paroxysmal atrial fibrillation) 02/05/2011   Documented 2012- NSR since.  On chronic anticoagulation.  . Pernicious anemia 02/20/2014   followed by Debbrah Alar  . Pneumonia   . PONV (postoperative nausea and vomiting)   . Pyelonephritis 11/24/2018  . Rash    thighs and back  . Seizures    infancy secondary to fever  . Staph aureus infection   . Thoracic spondylosis 12/06/2010   Noted on CT  . Uterine cancer 2014   Mirena IUD  . UTI (urinary tract infection) 12/13/2018  . Vitamin B 12 deficiency   . Vitamin D deficiency 05/10/2007   Qualifier: Diagnosis of  By: Wynona Luna      Social History   Socioeconomic History  . Marital status: Single    Spouse name: Not on file  . Number of children: 0  . Years of education: 51  . Highest education level: Some college, no degree  Occupational History  . Occupation: works in Insurance claims handler  . Occupation: DESIGN COMPUTER CHIP    Employer: [employer]  Tobacco Use  . Smoking status: Former Smoker    Packs/day: 0.50    Years: 25.00    Pack years: 12.50    Types: Cigarettes    Start date: 12/24/1970    Quit date: 05/27/1995    Years since quitting: 24.2  . Smokeless tobacco: Never Used  . Tobacco comment: quit smoking 23 years ago  Substance and Sexual Activity  . Alcohol use: Yes    Alcohol/week: 0.0 standard drinks    Comment: 1/2 glass per month  . Drug use: No  . Sexual activity: Yes    Partners: Male    Birth control/protection: Post-menopausal  Other Topics Concern  . Not on file  Social History Narrative   Occupation: works in Insurance claims handler - Field seismologist   Single       Former Smoker - quit tobacco 12 years ago.  She was light smoker for 10 years.             College edu      Right handed      Lives alone in one story home. Has  steps to enter home.      Social Determinants of Health   Financial Resource Strain:   . Difficulty of Paying Living Expenses:   Food Insecurity:   . Worried About Charity fundraiser in the  Last Year:   . [place] in the Last Year:   Transportation Needs:   . Film/video editor (Medical):   [name] Lack of Transportation (Non-Medical):   Physical Activity:   . Days of Exercise per Week:   . Minutes of Exercise per Session:   Stress:   . Feeling of Stress :   Social Connections:   . Frequency of Communication with Friends and Family:   . Frequency of Social Gatherings with Friends and Family:   . Attends Religious Services:   . Active Member of Clubs or Organizations:   . Attends Archivist Meetings:   [name] Marital Status:   Intimate Partner Violence:   . Fear of Current or Ex-Partner:   . Emotionally Abused:   [name] Physically Abused:   . Sexually Abused:     Past Surgical History:  Procedure Laterality Date  . AMPUTATION Right 05/18/2014   Procedure: RIGHT FIFTH RAY AMPUTATION FOOT;  Surgeon: Wylene Simmer, MD;  Location: [place];  Service: Orthopedics;  Laterality: Right;  . APPENDECTOMY    . BUNIONECTOMY     bilateral  . COLONOSCOPY WITH PROPOFOL N/A 02/02/2014   Procedure: COLONOSCOPY WITH PROPOFOL;  Surgeon: Irene Shipper, MD;  Location: [facility];  Service: Endoscopy;  Laterality: N/A;  . cyst on ovary removed     . CYSTOSCOPY W/ RETROGRADES  03/03/2012   Procedure: CYSTOSCOPY WITH RETROGRADE PYELOGRAM;  Surgeon: Bernestine Amass, MD;  Location: [facility];  Service: Urology;  Laterality: Bilateral;  . CYSTOSCOPY W/ URETERAL STENT PLACEMENT Right 11/25/2017   Procedure: CYSTOSCOPY WITH RETROGRADE RIGHT URETERAL STENT PLACEMENT;  Surgeon: Franchot Gallo, MD;  Location: [place];  Service: Urology;  Laterality: Right;  . CYSTOSCOPY W/ URETERAL STENT PLACEMENT Right 01/15/2018   Procedure: RIGHT STENT EXCHANGE;  Surgeon: Ardis Hughs, MD;  Location: [facility];   Service: Urology;  Laterality: Right;  . CYSTOSCOPY WITH RETROGRADE PYELOGRAM, URETEROSCOPY AND STENT PLACEMENT Left 08/25/2012   Procedure: CYSTOSCOPY WITH RETROGRADE PYELOGRAM, URETEROSCOPY;  Surgeon: Bernestine Amass, MD;  Location: [facility];  Service: Urology;  Laterality: Left;  . CYSTOSCOPY WITH STENT PLACEMENT Left 01/15/2018   Procedure: LEFT STENT PLACEMENT;  Surgeon: Ardis Hughs, MD;  Location: [facility];  Service: Urology;  Laterality: Left;  . CYSTOSCOPY WITH STENT PLACEMENT Right 01/22/2018   Procedure: RIGHT STENT REMOVAL;  Surgeon: Ardis Hughs, MD;  Location: [facility];  Service: Urology;  Laterality: Right;  . CYSTOSCOPY/URETEROSCOPY/HOLMIUM LASER/STENT PLACEMENT Right 12/23/2017   Procedure: RIGHT URETEROSCOPY STONE REMOVAL, HOLMIUM LASER, RIGHT /STENT EXCHANGE;  Surgeon: Ardis Hughs, MD;  [facility];  Service: Urology;  Laterality: Right;  . CYSTOSCOPY/URETEROSCOPY/HOLMIUM LASER/STENT PLACEMENT Left 01/22/2018   Procedure: LEFT URETEROSCOPY STONE REMOVAL HOLMIUM LASER LEFT STENT Freddi Starr;  Surgeon: Ardis Hughs, MD;  Location: [facility];  Service: Urology;  Laterality: Left;  . DILATION AND CURETTAGE OF UTERUS N/A 02/21/2013   Procedure: DILATATION AND CURETTAGE;  Surgeon: Lyman Speller, MD;  Location: [facility];  Service: Gynecology;  Laterality: N/A;  . DILATION AND CURETTAGE OF UTERUS N/A 04/09/2015   Procedure: [place] IUD removal;  Surgeon: Megan Salon, MD;  Location: [facility];  Service: Gynecology;  Laterality: N/A;  Patient weight 307lbs  . ESOPHAGOGASTRODUODENOSCOPY (EGD) WITH PROPOFOL N/A 02/02/2014   Procedure: ESOPHAGOGASTRODUODENOSCOPY (EGD) WITH PROPOFOL;  Surgeon: Irene Shipper, MD;  Location[facility];  Service: Endoscopy;  Laterality: N/A;  . GASTRIC BYPASS  1974  .  HOLMIUM LASER APPLICATION Left 11/29/4140   Procedure: HOLMIUM LASER APPLICATION;  Surgeon: Bernestine Amass, MD;  Location: [facility];   Service: Urology;  Laterality: Left;  . LITHOTRIPSY  03/2012  . LITHOTRIPSY  2/16  . THYROIDECTOMY  05/15/2010  . TONSILLECTOMY AND ADENOIDECTOMY    . URETEROSCOPY  03/03/2012   Procedure: URETEROSCOPY;  Surgeon: Bernestine Amass, MD;  Location: [facility];  Service: Urology;  Laterality: Left;  . URETEROSCOPY WITH HOLMIUM LASER LITHOTRIPSY Bilateral 01/15/2018   Procedure: CYSTOSCOPY/BILATERAL URETEROSCOPY WITH HOLMIUM LASER LITHOTRIPSY STONE REMOVAL;  Surgeon: Ardis Hughs, MD;  Location: [facility];  Service: Urology;  Laterality: Bilateral;    Family History  Problem Relation Age of Onset  . Hypertension Father   . Diabetes Father   . Lung cancer Father   . Heart attack Father        MI at age 25  . Hypertension Mother   . Hyperthyroidism Mother   . Heart disease Mother   . Heart attack Mother        MI at age 53  . Asthma Brother   . Hypertension Brother        younger  . Heart disease Brother        older  . Dementia Paternal Aunt        Unspecified type; symptoms emerged with advanced age  . Colon cancer Neg Hx   . Esophageal cancer Neg Hx   . Stomach cancer Neg Hx   . Kidney disease Neg Hx   . Liver disease Neg Hx     Allergies  Allergen Reactions  . Other Anaphylaxis    Reaction to tree nuts  . Amoxicillin-Pot Clavulanate Other (See Comments)    headache  . Ciprofloxacin Nausea Only and Rash  . Food Hives and Other (See Comments)    Potato  . Keflex [Cephalexin] Rash  . Penicillins Rash    Headache. And hives - Dose not tolerate Cephalosporin either   Has patient had a PCN reaction causing immediate rash, facial/tongue/throat swelling, SOB or lightheadedness with hypotension: No Has patient had a PCN reaction causing severe rash involving mucus membranes or skin necrosis: No Has patient had a PCN reaction that required hospitalization: No Has patient had a PCN reaction occurring within the last 10 years: Yes (reaction was May 2020) If all of the above answers  are "NO", then may proce  . Rocephin [Ceftriaxone] Rash  . Tomato Hives    Current Outpatient Medications on File Prior to Visit  Medication Sig Dispense Refill  . acetaminophen (TYLENOL) 325 MG tablet Take 2 tablets (650 mg total) by mouth every 6 (six) hours as needed for mild pain or moderate pain. 60 tablet 0  . albuterol (PROVENTIL HFA;VENTOLIN HFA) 108 (90 Base) MCG/ACT inhaler Inhale 2 puffs into the lungs every 6 (six) hours as needed for wheezing or shortness of breath. 1 Inhaler 5  . albuterol (PROVENTIL) (2.5 MG/3ML) 0.083% nebulizer solution Take 3 mLs (2.5 mg total) by nebulization every 6 (six) hours as needed for wheezing or shortness of breath. 150 mL 1  . calcitRIOL (ROCALTROL) 0.5 MCG capsule Take 2 mcg by mouth daily.     . cyanocobalamin (,VITAMIN B-12,) 1000 MCG/ML injection Inject 1 mL (1,000 mcg total) into the skin every 30 (thirty) days. 3 mL 4  . desoximetasone (TOPICORT) 0.05 % cream Apply topically 2 (two) times daily as needed. 60 g 0  . diclofenac sodium (VOLTAREN) 1 % GEL Apply 2  g topically 4 (four) times daily. 100 g 3  . diphenhydrAMINE (BENADRYL) 25 mg capsule Take 25 mg by mouth every 6 (six) hours as needed for itching.     [name] EPINEPHrine (EPIPEN 2-PAK) 0.3 mg/0.3 mL IJ SOAJ injection Inject 0.3 mg into the muscle once as needed for anaphylaxis (severe allergic reaction).     . furosemide (LASIX) 40 MG tablet Take 1 tablet (40 mg total) by mouth 2 (two) times daily. 270 tablet 3  . hydrocortisone cream 1 % Apply topically 4 (four) times daily as needed for itching. 30 g 0  . levocetirizine (XYZAL) 5 MG tablet TAKE 1 TABLET BY MOUTH  EVERY EVENING 90 tablet 3  . levothyroxine (SYNTHROID, LEVOTHROID) 100 MCG tablet Take 400 mcg by mouth daily before breakfast.     . Melatonin 5 MG TABS Take 5 mg by mouth at bedtime as needed (for sleep).     . MYRBETRIQ 50 MG TB24 tablet Take 50 mg by mouth daily.    [name] nystatin (MYCOSTATIN/NYSTOP) powder Apply topically 2 (two)  times daily. 45 g 1  . nystatin cream (MYCOSTATIN) Apply 1 application topically 2 (two) times daily. 30 g 2  . omeprazole (PRILOSEC) 40 MG capsule TAKE 1 CAPSULE BY MOUTH  DAILY 90 capsule 1  . ondansetron (ZOFRAN) 4 MG tablet Take 1 tablet (4 mg total) by mouth every 8 (eight) hours as needed for nausea or vomiting. 30 tablet 0  . spironolactone (ALDACTONE) 25 MG tablet Take 1 tablet (25 mg total) by mouth daily. 90 tablet 2  . SYMBICORT 160-4.5 MCG/ACT inhaler INHALE 2 PUFFS BY MOUTH TWO TIMES DAILY 30.6 g 1  . Syringe/Needle, Disp, (SYRINGE 3CC/20GX1") 20G X 1" 3 ML MISC Use monthly as directed 50 each 0  . traMADol (ULTRAM) 50 MG tablet TAKE 1 TABLET(50 MG) BY MOUTH EVERY 12 HOURS AS NEEDED 30 tablet 0  . trimethoprim (TRIMPEX) 100 MG tablet Take 100 mg by mouth daily.    . Vitamin D, Ergocalciferol, (DRISDOL) 1.25 MG (50000 UT) CAPS capsule TAKE 1 CAPSULE BY MOUTH  EVERY MONDAY, WEDNESDAY,  AND FRIDAY. 39 capsule 1  . zafirlukast (ACCOLATE) 20 MG tablet Take 1 tablet (20 mg total) by mouth 2 (two) times daily before a meal. 120 tablet 0  . diltiazem (CARDIZEM CD) 120 MG 24 hr capsule Take 1 capsule (120 mg total) by mouth daily. 90 capsule 1   No current facility-administered medications on file prior to visit.    BP (!) 108/57 (BP Location: Right Arm, Patient Position: Sitting, Cuff Size: Large)   Pulse (!) 50   Temp (!) 97.3 F (36.3 C) (Temporal)   Resp 16   LMP 06/11/2012   SpO2 98%       Objective:   Physical Exam Constitutional:      Appearance: She is well-developed. She is obese.  Neck:     Thyroid: No thyromegaly.  Cardiovascular:     Rate and Rhythm: Normal rate and regular rhythm.     Heart sounds: Normal heart sounds. No murmur.  Pulmonary:     Effort: Pulmonary effort is normal. No respiratory distress.     Breath sounds: Normal breath sounds. No wheezing.  Musculoskeletal:     Cervical back: Neck supple.     Comments: 1+ bilateral LE edema.   Skin:     General: Skin is warm and dry.  Neurological:     Mental Status: She is alert and oriented to person, place, and time.  Psychiatric:        Behavior: Behavior normal.        Thought Content: Thought content normal.        Judgment: Judgment normal.           Assessment & Plan:  Atypical chest pain-will refer back to cardiology.  Patient is advised to call 911/go to the ER should she develop recurrent chest pain. EKG is independently reviewed and compared to most recent EKG on file.  Notes NSR, no acute changes.   Debilitation-desperately needs to do some physical therapy for rehabilitation.  She is now agreeable to do outpatient PT in the evenings.  We will work on trying to reschedule this for her.  Hypertension-blood pressure is still a bit soft.  We are limited in our options for her as she is currently taking Aldactone which is helping to support her hypokalemia.  Her diltiazem dose is helping to regulate her atrial fibrillation/heart rate.  She is taking Lasix 40 mg twice daily most days.  She rarely needs to take 80 in the morning for lower extremity edema.  She is not on any other antihypertensives.  Sacral ulcer-  Patient declined for me to examine her ulcer today.  I did discuss referral to wound care clinic with her. She is agreeable to the referral but states she can only go "if I have transportation." Referral has been placed.  Hypoparathyroidism- She reports that she has a follow up in 2 weeks with her endocrinologist and I have advised her to keep this appointment.    Plan lab work at her next visit in 1 month.  Lab is closed at time of her visit.   50 minutes spent on today's visit. Time was spent on chart review, coordination of care, and counseling about importance of PT, mobility and position changes to help heal her ulcer.   This visit occurred during the SARS-CoV-2 public health emergency.  Safety protocols were in place, including screening questions prior to the  visit, additional usage of staff PPE, and extensive cleaning of exam room while observing appropriate contact time as indicated for disinfecting solutions.

## 2019-08-08 ENCOUNTER — Ambulatory Visit: Payer: 59 | Admitting: Podiatry

## 2019-08-12 ENCOUNTER — Ambulatory Visit
Admission: RE | Admit: 2019-08-12 | Discharge: 2019-08-12 | Disposition: A | Discharge status: Home / Self Care | Payer: 59 | Source: Ambulatory Visit | Attending: Neurology | Admitting: Neurology

## 2019-08-12 ENCOUNTER — Other Ambulatory Visit: Payer: Self-pay | Admitting: Neurology

## 2019-08-12 ENCOUNTER — Other Ambulatory Visit: Payer: Self-pay

## 2019-08-12 DIAGNOSIS — R413 Other amnesia: Secondary | ICD-10-CM

## 2019-08-12 DIAGNOSIS — R292 Abnormal reflex: Secondary | ICD-10-CM

## 2019-08-13 ENCOUNTER — Other Ambulatory Visit: Payer: Self-pay | Admitting: Family

## 2019-08-15 ENCOUNTER — Telehealth: Payer: Self-pay | Admitting: Neurology

## 2019-08-15 ENCOUNTER — Other Ambulatory Visit: Payer: Self-pay

## 2019-08-15 ENCOUNTER — Telehealth: Payer: Self-pay

## 2019-08-15 ENCOUNTER — Ambulatory Visit: Payer: 59 | Admitting: Podiatry

## 2019-08-15 DIAGNOSIS — M4800 Spinal stenosis, site unspecified: Secondary | ICD-10-CM

## 2019-08-15 DIAGNOSIS — R296 Repeated falls: Secondary | ICD-10-CM

## 2019-08-15 NOTE — Telephone Encounter (Signed)
[facility] called to report Mri report is in [place]. Please see report. Mri cervical spine showed degengerative disc osteophyts at c5-c6. Results in severe spinal stenosis and spinal cord compression.

## 2019-08-15 NOTE — Telephone Encounter (Signed)
Discussed results of testing with patient. Neurocognitive testing indicated mild cognitive impairment, likely multifactorial (combination of medical, psychiatric, polypharmacy) represent the primary etiology for cognitive weaknesses. MRI brain no acute changes. Discussed concerning findings for MRI cervical spine showing severe spinal stenosis at C5-6 with cord compression, chronic myelomalacia; there was also mild to moderate diffuse spinal stenosis at C3-4 through C6-7. This is likely the cause of her repeated falls. Discussed referral to Neurosurgery.  Heather, pls send referral to Neurosurgery, pls send results of MRI cervical spine, Dx: severe spinal stenosis, frequent falls. Thanks

## 2019-08-15 NOTE — Progress Notes (Signed)
referral to Neurosurgery placed internal they can pull from Epic to [facility]

## 2019-08-16 ENCOUNTER — Telehealth: Payer: Self-pay | Admitting: Neurology

## 2019-08-16 ENCOUNTER — Telehealth: Payer: Self-pay

## 2019-08-16 NOTE — Telephone Encounter (Signed)
Patient switched her June appt to a virtual and would like a copy fo the test results to be put on [facility]

## 2019-08-16 NOTE — Telephone Encounter (Signed)
I thought all the MRIs are available for them on MyChart, not sure what else to do on my end?

## 2019-08-16 NOTE — Telephone Encounter (Signed)
Pt advised that MRI results are on mychart and that it looks like she has seen them. Pt stated she did see them thought that something else was going to posted on my chart, pt informed that neuro surgery referral was placed

## 2019-08-22 ENCOUNTER — Other Ambulatory Visit: Payer: Self-pay

## 2019-08-22 ENCOUNTER — Encounter (HOSPITAL_BASED_OUTPATIENT_CLINIC_OR_DEPARTMENT_OTHER): Payer: 59 | Attending: Internal Medicine | Admitting: Internal Medicine

## 2019-08-22 DIAGNOSIS — I509 Heart failure, unspecified: Secondary | ICD-10-CM | POA: Insufficient documentation

## 2019-08-22 DIAGNOSIS — L8996 Pressure-induced deep tissue damage of unspecified site: Secondary | ICD-10-CM | POA: Insufficient documentation

## 2019-08-22 DIAGNOSIS — Z8614 Personal history of Methicillin resistant Staphylococcus aureus infection: Secondary | ICD-10-CM | POA: Insufficient documentation

## 2019-08-22 DIAGNOSIS — Z87891 Personal history of nicotine dependence: Secondary | ICD-10-CM | POA: Diagnosis not present

## 2019-08-22 DIAGNOSIS — G3184 Mild cognitive impairment, so stated: Secondary | ICD-10-CM | POA: Diagnosis not present

## 2019-08-22 DIAGNOSIS — J449 Chronic obstructive pulmonary disease, unspecified: Secondary | ICD-10-CM | POA: Insufficient documentation

## 2019-08-22 DIAGNOSIS — Z8542 Personal history of malignant neoplasm of other parts of uterus: Secondary | ICD-10-CM | POA: Insufficient documentation

## 2019-08-22 DIAGNOSIS — Z85850 Personal history of malignant neoplasm of thyroid: Secondary | ICD-10-CM | POA: Insufficient documentation

## 2019-08-22 DIAGNOSIS — M4712 Other spondylosis with myelopathy, cervical region: Secondary | ICD-10-CM | POA: Insufficient documentation

## 2019-08-22 DIAGNOSIS — I110 Hypertensive heart disease with heart failure: Secondary | ICD-10-CM | POA: Diagnosis not present

## 2019-08-22 DIAGNOSIS — R296 Repeated falls: Secondary | ICD-10-CM | POA: Diagnosis not present

## 2019-08-22 NOTE — Progress Notes (Signed)
KATHRIN, SANDROCK ([id number]) Visit Report for 08/22/2019 Abuse/Suicide Risk Screen Details Patient Name: Date of Service: [name], [name] 08/22/2019 2:45 PM Medical Record Number:[id number] Patient Account Number: [id number] Date of Birth/Sex: Treating RN: [birth year] (64 y.o. Orvan Falconer Primary Care Caya Soberanis: Debbrah Alar Other Clinician: Referring Emary Zalar: Treating Breena Bevacqua/Extender:Robson, Ena Dawley, Steffanie Rainwater in Treatment: 0 Abuse/Suicide Risk Screen Items Answer ABUSE RISK SCREEN: Has anyone close to you tried to hurt or harm you recentlyo No Do you feel uncomfortable with anyone in your familyo No Has anyone forced you do things that you didnt want to doo No Electronic Signature(s) Signed: 08/22/2019 5:30:47 PM By: Carlene Coria RN Entered By: Carlene Coria on 08/22/2019 15:48:17 -------------------------------------------------------------------------------- Activities of Daily Living Details Patient Name: Date of Service: TALIAH, THULIN 08/22/2019 2:45 PM Medical Record Number:[id number] Patient Account Number: [id number] Date of Birth/Sex: Treating RN: 12-05-1955 (64 y.o. Orvan Falconer Primary Care Byrne Capek: Debbrah Alar Other Clinician: Referring Herta Hink: Treating Jovanne Riggenbach/Extender:Robson, Ena Dawley, Steffanie Rainwater in Treatment: 0 Activities of Daily Living Items Answer Activities of Daily Living (Please select one for each item) Drive Automobile Not Able Take Medications Completely Able Use Telephone Completely Able Care for Appearance Need Assistance Use Toilet Need Assistance Bath / Shower Need Assistance Dress Self Need Assistance Feed Self Completely Able Walk Need Assistance Get In / Out Bed Need Assistance Housework Not Able Prepare Meals Need Assistance Handle Money Not Able Shop for Self Not Able Electronic Signature(s) Signed: 08/22/2019 5:30:47 PM By: Carlene Coria RN Entered By: Carlene Coria on  08/22/2019 15:49:48 -------------------------------------------------------------------------------- Education Screening Details Patient Name: Date of Service: [name] 08/22/2019 2:45 PM Medical Record Number:[id number] Patient Account Number: [id number] Date of Birth/Sex: Treating RN: 1955/05/27 (63 y.o. Orvan Falconer Primary Care Joycelyn Liska: Debbrah Alar Other Clinician: Referring Calysta Craigo: Treating Nashayla Telleria/Extender:Robson, Ena Dawley, Steffanie Rainwater in Treatment: 0 Primary Learner Assessed: Patient Learning Preferences/Education Level/Primary Language Learning Preference: Explanation Highest Education Level: College or Above Preferred Language: English Cognitive Barrier Language Barrier: No Translator Needed: No Memory Deficit: No Emotional Barrier: No Cultural/Religious Beliefs Affecting Medical Care: No Physical Barrier Impaired Vision: Yes Glasses Impaired Hearing: No Decreased Hand dexterity: No Knowledge/Comprehension Knowledge Level: Medium Comprehension Level: High Ability to understand written High instructions: Ability to understand verbal High instructions: Motivation Anxiety Level: Calm Cooperation: Cooperative Education Importance: Acknowledges Need Interest in Health Problems: Asks Questions Perception: Coherent Willingness to Engage in Self- High Management Activities: Readiness to Engage in Self- High Management Activities: Electronic Signature(s) Signed: 08/22/2019 5:30:47 PM By: Carlene Coria RN Entered By: Carlene Coria on 08/22/2019 15:50:20 -------------------------------------------------------------------------------- Fall Risk Assessment Details Patient Name: Date of Service: [name] 08/22/2019 2:45 PM Medical Record Number:[id number] Patient Account Number: [id number] Date of Birth/Sex: Treating RN: 12-Aug-1955 (64 y.o. Orvan Falconer Primary Care Kashlyn Salinas: Debbrah Alar Other  Clinician: Referring Brayant Dorr: Treating Janayah Zavada/Extender:Robson, Ena Dawley, Steffanie Rainwater in Treatment: 0 Fall Risk Assessment Items Have you had 2 or more falls in the last 12 monthso 0 No Have you had any fall that resulted in injury in the last 12 monthso 0 No FALLS RISK SCREEN History of falling - immediate or within 3 months 0 No Secondary diagnosis (Do you have 2 or more medical diagnoseso) 0 No Ambulatory aid None/bed rest/wheelchair/nurse 0 No Crutches/cane/walker 0 No Furniture 0 No Intravenous therapy Access/Saline/Heparin Lock 0 No Weak (short steps with or without shuffle, stooped but able to lift head 0 No while walking, may seek support from furniture) Impaired (short steps with shuffle, may  have difficulty arising from chair, 0 No head down, impaired balance) Mental Status Oriented to own ability 0 No Overestimates or forgets limitations 0 No Risk Level: Low Risk Score: 0 Electronic Signature(s) Signed: 08/22/2019 5:30:47 PM By: Carlene Coria RN Entered By: Carlene Coria on 08/22/2019 15:50:36 -------------------------------------------------------------------------------- Nutrition Risk Screening Details Patient Name: Date of Service: AERIELLE, WEINHOLD 08/22/2019 2:45 PM Medical Record Number:[id number] Patient Account Number: [id number] Date of Birth/Sex: Treating RN: [birth year] (64 y.o. Orvan Falconer Primary Care Ellsie Violette: Debbrah Alar Other Clinician: Referring Amaree Loisel: Treating Gailyn Crook/Extender:Robson, Ena Dawley, Steffanie Rainwater in Treatment: 0 Height (in): 64 Weight (lbs): 248 Body Mass Index (BMI): 42.6 Nutrition Risk Screening Items Score Screening NUTRITION RISK SCREEN: I have an illness or condition that made me change the kind and/or 0 No amount of food I eat I eat fewer than two meals per day 0 No I eat few fruits and vegetables, or milk products 0 No I have three or more drinks of beer, liquor or wine almost every day  0 No I have tooth or mouth problems that make it hard for me to eat 0 No I don't always have enough money to buy the food I need 0 No I eat alone most of the time 0 No I take three or more different prescribed or over-the-counter drugs a day 1 Yes 0 No Without wanting to, I have lost or gained 10 pounds in the last six months I am not always physically able to shop, cook and/or feed myself 2 Yes Nutrition Protocols Good Risk Protocol Provide education on Moderate Risk Protocol 0 nutrition High Risk Proctocol Risk Level: Moderate Risk Score: 3 Electronic Signature(s) Signed: 08/22/2019 5:30:47 PM By: Carlene Coria RN Entered By: Carlene Coria on 08/22/2019 15:53:20

## 2019-08-22 NOTE — Progress Notes (Signed)
[name] ([id number]) Visit Report for 08/22/2019 Chief Complaint Document Details Patient Name: Date of Service: [name], [name] 08/22/2019 2:45 PM Medical Record [id number] Patient Account Number: [id number] Date of Birth/Sex: Treating RN: 1955/09/29 (64 y.o. [name] Primary Care Provider: Debbrah Alar Other Clinician: Referring Provider: Treating Provider/Extender:Jaris Kohles, Ena Dawley, Steffanie Rainwater in Treatment: 0 Information Obtained from: Patient Chief Complaint 08/22/2019; the patient is here for review of wound concerns on her lower sacrum and surrounding buttocks Electronic Signature(s) Signed: 08/22/2019 5:31:36 PM By: Linton Ham MD Entered By: Linton Ham on 08/22/2019 16:58:04 -------------------------------------------------------------------------------- HPI Details Patient Name: Date of Service: [name] 08/22/2019 2:45 PM Medical Record Number:[id number] Patient Account Number: [id number] Date of Birth/Sex: Treating RN: 1955/11/26 (64 y.o. [name] Primary Care Provider: Debbrah Alar Other Clinician: Referring Provider: Treating Provider/Extender:Coleston Dirosa, Ena Dawley, Steffanie Rainwater in Treatment: 0 History of Present Illness HPI Description: ADMISSION 08/22/2019 This is a 64 year old woman who is seen in clinic today accompanied by her sister. She is here for review of sacral and surrounding buttock pressure injury. She apparently has had open wounds in fact there is note in [place] link from her primary doctor from 06/17/2019 noting a pressure ulcer on her sacrum stage II. More recently she has had areas on her buttocks. They are using DuoDERM. She comes into clinic today with nothing really open. She has an extensive medical problem list although recently I note that she has been evaluated by neurology because of frequent falls. An MRI showed significant spinal stenosis C3-C4 and  C6-C7. She has an appointment with neurosurgery. The patient is mobile but with the aid of a rolling walker at home. She sits most of the day in a recliner and sleeps in a recliner at home. This is predominantly because she cannot get up out of a standard bed and therefore needs to be in the recliner to get out of the chair independently. She lives alone. Past medical history includes frequent falls, cervical spinal stenosis, major depression, paroxysmal atrial fibrillation, COPD, hypoparathyroidism, fibromyalgia, mild cognitive impairment, nephrolithiasis, history of osteomyelitis although I am not sure where, history of endometrial adenocarcinoma, history of uterine cancer and thyroid cancer. Is also noted under problem list that she had MRSA infection at some point but I am not sure which area that was. She has had pilonidal disease surgery in the remote past. Electronic Signature(s) Signed: 08/22/2019 5:31:36 PM By: Linton Ham MD Entered By: Linton Ham on 08/22/2019 17:03:00 -------------------------------------------------------------------------------- Physical Exam Details Patient Name: Date of Service: [name] 08/22/2019 2:45 PM Medical Record [id number] Patient Account Number: [id number] Date of Birth/Sex: Treating RN: [birth year] (64 y.o. [name] Primary Care Provider: Debbrah Alar Other Clinician: Referring Provider: Treating Provider/Extender:Wilmon Conover, Ena Dawley, Steffanie Rainwater in Treatment: 0 Constitutional Patient is hypertensive.. Pulse regular and within target range for patient.[name] Respirations regular, non-labored and within target range.. Temperature is normal and within the target range for the patient.[name] Appears in no distress. Respiratory work of breathing is normal. Integumentary (Hair, Skin) No primary systemic cutaneous disorders. Psychiatric Patient appears depressed today.. Notes Wound exam; the patient has no  open wound. She has a cleft in the lower sacrum where she has had her previous pilonidal surgery. Buttocks have some purple discoloration probably in the indicative of some deep tissue injury in these areas. Get there is no open wounds. Notable for the fact that she is very immobile even turning over caused a lot of pain in her legs. Electronic  Signature(s) Signed: 08/22/2019 5:31:36 PM By: Linton Ham MD Entered By: Linton Ham on 08/22/2019 17:04:15 -------------------------------------------------------------------------------- Physician Orders Details Patient Name: Date of Service: [name] 08/22/2019 2:45 PM Medical Record [id number] Patient Account Number: [id number] Date of Birth/Sex: Treating RN: [birth year] (64 y.o. [name] Primary Care Provider: Debbrah Alar Other Clinician: Referring Provider: Treating Provider/Extender:Alin Chavira, Ena Dawley, Steffanie Rainwater in Treatment: 0 Verbal / Phone Orders: No Diagnosis Coding [facility]charge [facility] Discharge from [place] Off-Loading Turn and reposition every 2 hours - avoid sleeping in recliner, try to avoid slouching in recliner Electronic Signature(s) Signed: 08/22/2019 5:31:36 PM By: Linton Ham MD Signed: 08/22/2019 5:31:53 PM By: Baruch Gouty RN, BSN Entered By: Baruch Gouty on 08/22/2019 16:50:36 -------------------------------------------------------------------------------- Problem List Details Patient Name: Date of Service: [name] 08/22/2019 2:45 PM Medical Record [id number] Patient Account Number: [id number] Date of Birth/Sex: Treating RN: 04-Feb-1956 (64 y.o. [name] Primary Care Provider: Debbrah Alar Other Clinician: Referring Provider: Treating Provider/Extender:Merranda Bolls, Ena Dawley, Steffanie Rainwater in Treatment: 0 Active Problems ICD-10 Evaluated Encounter Code Description Active Date Today  Diagnosis L89.306 Pressure-induced deep tissue damage of unspecified 08/22/2019 No Yes buttock M47.12 Other spondylosis with myelopathy, cervical region 08/22/2019 No Yes Inactive Problems Resolved Problems Electronic Signature(s) Signed: 08/22/2019 5:31:36 PM By: Linton Ham MD Entered By: Linton Ham on 08/22/2019 16:54:16 -------------------------------------------------------------------------------- Progress Note Details Patient Name: Date of Service: [name] 08/22/2019 2:45 PM Medical Record Number:[id number] Patient Account Number: [id number] Date of Birth/Sex: Treating RN: Sep 22, 1955 (64 y.o. [name] Primary Care Provider: Debbrah Alar Other Clinician: Referring Provider: Treating Provider/Extender:Joao Mccurdy, Ena Dawley, Steffanie Rainwater in Treatment: 0 Subjective Chief Complaint Information obtained from Patient 08/22/2019; the patient is here for review of wound concerns on her lower sacrum and surrounding buttocks History of Present Illness (HPI) ADMISSION 08/22/2019 This is a 64 year old woman who is seen in clinic today accompanied by her sister. She is here for review of sacral and surrounding buttock pressure injury. She apparently has had open wounds in fact there is note in [place] link from her primary doctor from 06/17/2019 noting a pressure ulcer on her sacrum stage II. More recently she has had areas on her buttocks. They are using DuoDERM. She comes into clinic today with nothing really open. She has an extensive medical problem list although recently I note that she has been evaluated by neurology because of frequent falls. An MRI showed significant spinal stenosis C3-C4 and C6-C7. She has an appointment with neurosurgery. The patient is mobile but with the aid of a rolling walker at home. She sits most of the day in a recliner and sleeps in a recliner at home. This is predominantly because she cannot get up out of a  standard bed and therefore needs to be in the recliner to get out of the chair independently. She lives alone. Past medical history includes frequent falls, cervical spinal stenosis, major depression, paroxysmal atrial fibrillation, COPD, hypoparathyroidism, fibromyalgia, mild cognitive impairment, nephrolithiasis, history of osteomyelitis although I am not sure where, history of endometrial adenocarcinoma, history of uterine cancer and thyroid cancer. Is also noted under problem list that she had MRSA infection at some point but I am not sure which area that was. She has had pilonidal disease surgery in the remote past. Patient History Information obtained from Patient. Allergies amoxicillin, ciprofloxacin, Keflex, penicillin, nut - unspecified Family History Diabetes - Mother,Father, Heart Disease - Paternal Grandparents,Mother, Hypertension - Paternal Grandparents,Mother,Father,Siblings, Lung Disease - Paternal Grandparents,Father, No  family history of Cancer, Hereditary Spherocytosis, Kidney Disease, Seizures, Stroke, Thyroid Problems, Tuberculosis. Social History Former smoker, Marital Status - Single, Alcohol Use - Rarely, Drug Use - No History, Caffeine Use - Daily. Medical History Eyes Patient has history of Cataracts, Glaucoma Denies history of Optic Neuritis Ear/Nose/Mouth/Throat Denies history of Chronic sinus problems/congestion, Middle ear problems Hematologic/Lymphatic Patient has history of Anemia Denies history of Hemophilia, Human Immunodeficiency Virus, Lymphedema, Sickle Cell Disease Respiratory Patient has history of Asthma, Chronic Obstructive Pulmonary Disease (COPD) Denies history of Aspiration, Pneumothorax, Sleep Apnea, Tuberculosis Cardiovascular Patient has history of Arrhythmia - afib, Congestive Heart Failure, Hypertension Denies history of Angina, Coronary Artery Disease, Deep Vein Thrombosis, Hypotension, Myocardial Infarction, Peripheral Arterial  Disease, Peripheral Venous Disease, Phlebitis, Vasculitis Gastrointestinal Denies history of Cirrhosis , Colitis, Crohnoos, Hepatitis A, Hepatitis B, Hepatitis C Endocrine Denies history of Type I Diabetes, Type II Diabetes Genitourinary Denies history of End Stage Renal Disease Immunological Denies history of Lupus Erythematosus, Raynaudoos, Scleroderma Integumentary (Skin) Denies history of History of Burn Musculoskeletal Denies history of Gout, Rheumatoid Arthritis, Osteoarthritis, Osteomyelitis Neurologic Denies history of Dementia, Neuropathy, Quadriplegia, Paraplegia, Seizure Disorder Oncologic Patient has history of Received Radiation - thyroid 2018 Psychiatric Denies history of Anorexia/bulimia, Confinement Anxiety Review of Systems (ROS) Constitutional Symptoms (General Health) Denies complaints or symptoms of Fatigue, Fever, Chills, Marked Weight Change. Eyes Complains or has symptoms of Glasses / Contacts. Denies complaints or symptoms of Dry Eyes, Vision Changes. Ear/Nose/Mouth/Throat Denies complaints or symptoms of Chronic sinus problems or rhinitis. Respiratory Denies complaints or symptoms of Chronic or frequent coughs, Shortness of Breath. Cardiovascular Denies complaints or symptoms of Chest pain. Gastrointestinal Denies complaints or symptoms of Frequent diarrhea, Nausea, Vomiting. Endocrine Denies complaints or symptoms of Heat/cold intolerance. Genitourinary Denies complaints or symptoms of Frequent urination. Integumentary (Skin) Complains or has symptoms of Wounds. Musculoskeletal Denies complaints or symptoms of Muscle Pain, Muscle Weakness. Neurologic Denies complaints or symptoms of Numbness/parasthesias. Oncologic thyroid uterine Psychiatric Denies complaints or symptoms of Claustrophobia, Suicidal. Objective Constitutional Patient is hypertensive.. Pulse regular and within target range for patient.[name] Respirations regular, non-labored  and within target range.. Temperature is normal and within the target range for the patient.[name] Appears in no distress. Vitals Time Taken: 3:41 PM, Height: 64 in, Source: Stated, Weight: 248 lbs, Source: Stated, BMI: 42.6, Temperature: 98.6 F, Pulse: 67 bpm, Respiratory Rate: 18 breaths/min, Blood Pressure: 141/56 mmHg. Respiratory work of breathing is normal. Psychiatric Patient appears depressed today.. General Notes: Wound exam; the patient has no open wound. She has a cleft in the lower sacrum where she has had her previous pilonidal surgery. Buttocks have some purple discoloration probably in the indicative of some deep tissue injury in these areas. Get there is no open wounds. Notable for the fact that she is very immobile even turning over caused a lot of pain in her legs. Integumentary (Hair, Skin) No primary systemic cutaneous disorders. Assessment Active Problems ICD-10 Pressure-induced deep tissue damage of unspecified buttock Other spondylosis with myelopathy, cervical region Plan Discharge From [facility] Services: Discharge from [place] Off-Loading: Turn and reposition every 2 hours - avoid sleeping in recliner, try to avoid slouching in recliner 1. The patient does not have an open wound but is at extreme risk of further skin breakdown secondary to very significant immobility 2. The idea of sitting and sleeping in a recliner 24/7 is very concerning for further breakdown in the sacrum and surrounding buttocks however no matter what we suggested the patient had a problem  that seemed insurmountable. 3. She is receiving physical therapy with the goal of becoming increasingly mobile hopefully she will be able to do better in terms of transfers. 4. I told her if she had a deep open wound in her buttocks or sacrum she would be unlikely to be a good candidate for neurosurgery. 5. I do not think she needs to dress this area with DuoDERM in fact that may make things worse. She  does not have an open area 6. I would think from review most of the problem here is severe cervical spondylosis with myelopathy. I asked her to consider ways of trying to remove some of the pressure from her lower sacrum and buttocks during the day and night. We might consider having an in-home PT and OT reevaluation if that was possible. 7. The patient does not need to follow with the wound care clinic if she does not have an open wound. I asked her to call us if there is further problems we can help her with Electronic Signature(s) Signed: 08/22/2019 5:31:36 PM By: Linton Ham MD Entered By: Linton Ham on 08/22/2019 17:08:50 -------------------------------------------------------------------------------- HxROS Details Patient Name: Date of Service: [name] 08/22/2019 2:45 PM Medical Record [id number] Patient Account Number: [id number] Date of Birth/Sex: Treating RN: 1955-06-15 (64 y.o. Orvan Falconer Primary Care Provider: Debbrah Alar Other Clinician: Referring Provider: Treating Provider/Extender:Birdella Sippel, Ena Dawley, Steffanie Rainwater in Treatment: 0 Information Obtained From Patient Constitutional Symptoms (General Health) Complaints and Symptoms: Negative for: Fatigue; Fever; Chills; Marked Weight Change Eyes Complaints and Symptoms: Positive for: Glasses / Contacts Negative for: Dry Eyes; Vision Changes Medical History: Positive for: Cataracts; Glaucoma Negative for: Optic Neuritis Ear/Nose/Mouth/Throat Complaints and Symptoms: Negative for: Chronic sinus problems or rhinitis Medical History: Negative for: Chronic sinus problems/congestion; Middle ear problems Respiratory Complaints and Symptoms: Negative for: Chronic or frequent coughs; Shortness of Breath Medical History: Positive for: Asthma; Chronic Obstructive Pulmonary Disease (COPD) Negative for: Aspiration; Pneumothorax; Sleep Apnea; Tuberculosis Cardiovascular Complaints  and Symptoms: Negative for: Chest pain Medical History: Positive for: Arrhythmia - afib; Congestive Heart Failure; Hypertension Negative for: Angina; Coronary Artery Disease; Deep Vein Thrombosis; Hypotension; Myocardial Infarction; Peripheral Arterial Disease; Peripheral Venous Disease; Phlebitis; Vasculitis Gastrointestinal Complaints and Symptoms: Negative for: Frequent diarrhea; Nausea; Vomiting Medical History: Negative for: Cirrhosis ; Colitis; Crohns; Hepatitis A; Hepatitis B; Hepatitis C Endocrine Complaints and Symptoms: Negative for: Heat/cold intolerance Medical History: Negative for: Type I Diabetes; Type II Diabetes Genitourinary Complaints and Symptoms: Negative for: Frequent urination Medical History: Negative for: End Stage Renal Disease Integumentary (Skin) Complaints and Symptoms: Positive for: Wounds Medical History: Negative for: History of Burn Musculoskeletal Complaints and Symptoms: Negative for: Muscle Pain; Muscle Weakness Medical History: Negative for: Gout; Rheumatoid Arthritis; Osteoarthritis; Osteomyelitis Neurologic Complaints and Symptoms: Negative for: Numbness/parasthesias Medical History: Negative for: Dementia; Neuropathy; Quadriplegia; Paraplegia; Seizure Disorder Psychiatric Complaints and Symptoms: Negative for: Claustrophobia; Suicidal Medical History: Negative for: Anorexia/bulimia; Confinement Anxiety Hematologic/Lymphatic Medical History: Positive for: Anemia Negative for: Hemophilia; Human Immunodeficiency Virus; Lymphedema; Sickle Cell Disease Immunological Medical History: Negative for: Lupus Erythematosus; Raynauds; Scleroderma Oncologic Complaints and Symptoms: Review of System Notes: thyroid uterine Medical History: Positive for: Received Radiation - thyroid 2018 HBO Extended History Items Eyes: Eyes: Cataracts Glaucoma Immunizations Pneumococcal Vaccine: Received Pneumococcal Vaccination: No Implantable  Devices None Family and Social History Cancer: No; Diabetes: Yes - Mother,Father; Heart Disease: Yes - Paternal Grandparents,Mother; Hereditary Spherocytosis: No; Hypertension: Yes - Paternal Grandparents,Mother,Father,Siblings; Kidney Disease: No; Lung Disease: Yes - Paternal Grandparents,Father; Seizures: No;  Stroke: No; Thyroid Problems: No; Tuberculosis: No; Former smoker; Marital Status - Single; Alcohol Use: Rarely; Drug Use: No History; Caffeine Use: Daily; Financial Concerns: No; Food, Clothing or Shelter Needs: No; Support System Lacking: No; Transportation Concerns: No Electronic Signature(s) Signed: 08/22/2019 5:30:47 PM By: Carlene Coria RN Signed: 08/22/2019 5:31:36 PM By: Linton Ham MD Entered By: Carlene Coria on 08/22/2019 15:48:05 -------------------------------------------------------------------------------- SuperBill Details Patient Name: Date of Service: [name] 08/22/2019 Medical Record [id number] Patient Account Number: [id number] Date of Birth/Sex: Treating RN: Sep 22, 1955 (64 y.o. [name] Primary Care Provider: Debbrah Alar Other Clinician: Referring Provider: Treating Provider/Extender:Jaleen Finch, Ena Dawley, Steffanie Rainwater in Treatment: 0 Diagnosis Coding ICD-10 Codes Code Description L89.306 Pressure-induced deep tissue damage of unspecified buttock M47.12 Other spondylosis with myelopathy, cervical region Facility Procedures CPT4 Code: [id number] Description: [id number] - WOUND CARE VISIT-LEV 3 EST PT Modifier: Quantity: 1 Physician Procedures CPT4 Code: [id number] Description: [id number] - WC PHYS LEVEL 2 - NEW PT ICD-10 Diagnosis Description L89.306 Pressure-induced deep tissue damage of unspecifie M47.12 Other spondylosis with myelopathy, cervical regio Modifier: d buttock n Quantity: 1 Electronic Signature(s) Signed: 08/22/2019 5:31:36 PM By: Linton Ham MD Entered By: Linton Ham on 08/22/2019 17:09:06

## 2019-08-22 NOTE — Progress Notes (Signed)
[name] ([id number]) Visit Report for 08/22/2019 Allergy List Details Patient Name: Date of Service: [name] 08/22/2019 2:45 PM Medical Record [id number] Patient Account Number: [id number] Date of Birth/Sex: Treating RN: 1955-08-20 (64 y.o. Orvan Falconer Primary Care Cuauhtemoc Huegel: Debbrah Alar Other Clinician: Referring Littleton Haub: Treating Kiasia Chou/Extender:Robson, Ena Dawley, Steffanie Rainwater in Treatment: 0 Allergies Active Allergies amoxicillin ciprofloxacin Keflex penicillin nut - unspecified Allergy Notes Electronic Signature(s) Signed: 08/22/2019 5:30:47 PM By: Carlene Coria RN Entered By: Carlene Coria on 08/22/2019 15:42:35 -------------------------------------------------------------------------------- Arrival Information Details Patient Name: Date of Service: [name] 08/22/2019 2:45 PM Medical Record [id number] Patient Account Number: [id number] Date of Birth/Sex: Treating RN: 1955/08/23 (64 y.o. Orvan Falconer Primary Care Hakeen Shipes: Debbrah Alar Other Clinician: Referring Dewitte Vannice: Treating Maan Zarcone/Extender:Robson, Ena Dawley, Steffanie Rainwater in Treatment: 0 Visit Information Patient Arrived: Wheel Chair Arrival Time: 15:40 Accompanied By: sister in law Transfer Assistance: None Patient Identification Verified: Yes Secondary Verification Process Yes Completed: Patient Has Alerts: Yes Patient Alerts: Patient on Blood Thinner Electronic Signature(s) Signed: 08/22/2019 5:30:47 PM By: Carlene Coria RN Entered By: Carlene Coria on 08/22/2019 15:41:04 -------------------------------------------------------------------------------- Clinic Level of Care Assessment Details Patient Name: Date of Service: [name] 08/22/2019 2:45 PM Medical Record Number:[id number] Patient Account Number: [id number] Date of Birth/Sex: Treating RN: 03-29-1956 (64 y.o. [name] Primary Care Zhoe Catania:  Debbrah Alar Other Clinician: Referring Jolana Runkles: Treating Oluwaseun Bruyere/Extender:Robson, Ena Dawley, Steffanie Rainwater in Treatment: 0 Clinic Level of Care Assessment Items TOOL 2 Quantity Score []  - Use when only an EandM is performed on the INITIAL visit 0 ASSESSMENTS - Nursing Assessment / Reassessment X - General Physical Exam (combine w/ comprehensive assessment (listed just below) 1 20 when performed on new pt. evals) X - Comprehensive Assessment (HX, ROS, Risk Assessments, Wounds Hx, etc.) 1 25 ASSESSMENTS - Wound and Skin Assessment / Reassessment []  - Simple Wound Assessment / Reassessment - one wound 0 []  - Complex Wound Assessment / Reassessment - multiple wounds 0 X - Dermatologic / Skin Assessment (not related to wound area) 1 10 ASSESSMENTS - Ostomy and/or Continence Assessment and Care []  - Incontinence Assessment and Management 0 []  - Ostomy Care Assessment and Management (repouching, etc.) 0 PROCESS - Coordination of Care X - Simple Patient / Family Education for ongoing care 1 15 []  - Complex (extensive) Patient / Family Education for ongoing care 0 X - Staff obtains Consents, Records, Test Results / Process Orders 1 10 []  - Staff telephones HHA, Nursing Homes / Clarify orders / etc 0 []  - Routine Transfer to another Facility (non-emergent condition) 0 []  - Routine Hospital Admission (non-emergent condition) 0 X - New Admissions / Biomedical engineer / Ordering NPWT, Apligraf, etc. 1 15 []  - Emergency Hospital Admission (emergent condition) 0 X - Simple Discharge Coordination 1 10 []  - Complex (extensive) Discharge Coordination 0 PROCESS - Special Needs []  - Pediatric / Minor Patient Management 0 []  - Isolation Patient Management 0 []  - Hearing / Language / Visual special needs 0 []  - Assessment of Community assistance (transportation, D/C planning, etc.) 0 []  - Additional assistance / Altered mentation 0 []  - Support Surface(s) Assessment (bed,  cushion, seat, etc.) 0 INTERVENTIONS - Wound Cleansing / Measurement []  - Wound Imaging (photographs - any number of wounds) 0 []  - Wound Tracing (instead of photographs) 0 []  - Simple Wound Measurement - one wound 0 []  - Complex Wound Measurement - multiple wounds 0 []  - Simple Wound Cleansing - one wound 0 []  - Complex Wound  Cleansing - multiple wounds 0 INTERVENTIONS - Wound Dressings []  - Small Wound Dressing one or multiple wounds 0 []  - Medium Wound Dressing one or multiple wounds 0 []  - Large Wound Dressing one or multiple wounds 0 []  - Application of Medications - injection 0 INTERVENTIONS - Miscellaneous []  - External ear exam 0 []  - Specimen Collection (cultures, biopsies, blood, body fluids, etc.) 0 []  - Specimen(s) / Culture(s) sent or taken to Lab for analysis 0 []  - Patient Transfer (multiple staff / Harrel Lemon Lift / Similar devices) 0 []  - Simple Staple / Suture removal (25 or less) 0 []  - Complex Staple / Suture removal (26 or more) 0 []  - Hypo / Hyperglycemic Management (close monitor of Blood Glucose) 0 []  - Ankle / Brachial Index (ABI) - do not check if billed separately 0 Has the patient been seen at the hospital within the last three years: Yes Total Score: 105 Level Of Care: New/Established - Level 3 Electronic Signature(s) Signed: 08/22/2019 5:31:53 PM By: Baruch Gouty RN, BSN Entered By: Baruch Gouty on 08/22/2019 16:42:17 -------------------------------------------------------------------------------- Encounter Discharge Information Details Patient Name: Date of Service: [name] 08/22/2019 2:45 PM Medical Record Number:[id number] Patient Account Number: [id number] Date of Birth/Sex: Treating RN: 08/31/1955 (63 y.o. [name] Primary Care Margarita Croke: Debbrah Alar Other Clinician: Referring Rosana Farnell: Treating Delancey Moraes/Extender:Robson, Ena Dawley, Steffanie Rainwater in Treatment: 0 Encounter Discharge Information  Items Discharge Condition: Stable Ambulatory Status: Wheelchair Discharge Destination: Home Transportation: Private Auto Accompanied By: sister Schedule Follow-up Appointment: Yes Clinical Summary of Care: Patient Declined Electronic Signature(s) Signed: 08/22/2019 5:31:53 PM By: Baruch Gouty RN, BSN Entered By: Baruch Gouty on 08/22/2019 16:56:23 -------------------------------------------------------------------------------- Pain Assessment Details Patient Name: Date of Service: [name] 08/22/2019 2:45 PM Medical Record Number:[id number] Patient Account Number: [id number] Date of Birth/Sex: Treating RN: 1956-04-09 (63 y.o. Orvan Falconer Primary Care Miria Cappelli: Debbrah Alar Other Clinician: Referring Bexton Haak: Treating Ranon Coven/Extender:Robson, Ena Dawley, Steffanie Rainwater in Treatment: 0 Active Problems Location of Pain Severity and Description of Pain Patient Has Paino Yes Site Locations With Dressing Change: Yes Duration of the Pain. Constant / Intermittento Intermittent Rate the pain. Current Pain Level: 3 Worst Pain Level: 6 Least Pain Level: 0 Tolerable Pain Level: 5 Character of Pain Describe the Pain: Burning Pain Management and Medication Current Pain Management: Medication: No Cold Application: No Rest: No Massage: No Activity: No T.E.N.S.: No Heat Application: No Leg drop or elevation: No Is the Current Pain Management Adequate: Inadequate How does your wound impact your activities of daily livingo Sleep: No Bathing: No Appetite: No Relationship With Others: No Bladder Continence: No Emotions: No Bowel Continence: No Work: No Toileting: No Drive: No Dressing: No Hobbies: No Electronic Signature(s) Signed: 08/22/2019 5:30:47 PM By: Carlene Coria RN Entered By: Carlene Coria on 08/22/2019 16:07:56 -------------------------------------------------------------------------------- Patient/Caregiver Education  Details Patient Name: Date of Service: [name] 3/29/2021andnbsp2:45 PM Medical Record Patient Account Number: [id number] Number: Treating RN: Baruch Gouty Date of Birth/Gender: Feb 28, 1956 (63 y.o. F) Other Clinician: Primary Care Physician: Debbrah Alar Treating Linton Ham Referring Physician: Physician/Extender: Lauraine Rinne in Treatment: 0 Education Assessment Education Provided To: Patient Education Topics Provided Pressure: Handouts: Preventing Pressure Ulcers Methods: Explain/Verbal, Printed Responses: Reinforcements needed, State content correctly Welcome To [facility] [place]: Handouts: Welcome To [facility] [place] Methods: Explain/Verbal, Printed Responses: Reinforcements needed, State content correctly Wound/Skin Impairment: Handouts: Skin Care Do's and Dont's Methods: Explain/Verbal, Printed Responses: Reinforcements needed, State content correctly Electronic Signature(s) Signed: 08/22/2019 5:31:53 PM  By: Baruch Gouty RN, BSN Entered By: Baruch Gouty on 08/22/2019 16:40:29 -------------------------------------------------------------------------------- [place] Details Patient Name: Date of Service: [name] 08/22/2019 2:45 PM Medical Record [id number] Patient Account Number: [id number] Date of Birth/Sex: Treating RN: February 26, 1956 (63 y.o. Orvan Falconer Primary Care Dorann Davidson: Debbrah Alar Other Clinician: Referring Muskaan Smet: Treating Deserai Cansler/Extender:Robson, Ena Dawley, Steffanie Rainwater in Treatment: 0 Vital Signs Time Taken: 15:41 Temperature (F): 98.6 Height (in): 64 Pulse (bpm): 67 Source: Stated Respiratory Rate (breaths/min): 18 Weight (lbs): 248 Blood Pressure (mmHg): 141/56 Source: Stated Reference Range: 80 - 120 mg / dl Body Mass Index (BMI): 42.6 Electronic Signature(s) Signed: 08/22/2019 5:30:47 PM By: Carlene Coria RN Entered By: Carlene Coria on  08/22/2019 15:41:39

## 2019-08-23 ENCOUNTER — Ambulatory Visit: Payer: 59 | Admitting: Nurse Practitioner

## 2019-08-23 ENCOUNTER — Other Ambulatory Visit: Payer: Self-pay

## 2019-08-23 VITALS — BP 120/47 | HR 60 | Temp 97.4°F | Resp 18 | Ht 64.0 in | Wt 248.0 lb

## 2019-08-23 DIAGNOSIS — T148XXA Other injury of unspecified body region, initial encounter: Secondary | ICD-10-CM

## 2019-08-23 DIAGNOSIS — F331 Major depressive disorder, recurrent, moderate: Secondary | ICD-10-CM

## 2019-08-23 MED ORDER — DULOXETINE HCL 30 MG PO CPEP: ORAL_CAPSULE | ORAL | 0 refills | Status: DC

## 2019-08-23 NOTE — Patient Instructions (Addendum)
It was nice to meet you today.   Your arm bruising is from trauma- striking your arm and easy bruising. Call us with your blood thinner name and dose  that you are taking.    Your depression screen PHQ-9 showed you are depressed. We will start you on Cymbalta take as directed.    Please see Melissa in 1 month for follow up.   Great job on starting physical therapy! Keep up the good self-care that  you are doing.

## 2019-08-23 NOTE — Progress Notes (Signed)
2

## 2019-08-23 NOTE — Progress Notes (Addendum)
Established Patient Office Visit  Subjective:  Patient ID: [name], female    DOB: 02/24/1956  Age: 64 y.o. MRN: [id number]  CC:  Chief Complaint  Patient presents with  . skin check    blisters on left hand and arm , hit hand ara against the computer    HPI [name] presents for evaluation of a blood blister on her right forearm noticed  x 1 week. She strikes her arm against her computer desk when she stands up. She bruises easily. No other trauma to the forearm. She attributes the easy bruising to her blood thinner  that she is currently taking. Her medication list does not include anticoagulation medication. Per last telemedicine note 06/17/2019 by Dr. Stanford Breed, she stopped her Xarelto d/t bleeding in her stool and bruising.   She saw Dr. Dellia Nims, a wound care specialist yesterday for evaluation of sacrum and there was no ulcer or wound present. She was advised to get out of the recliner to avoid constant pressure on the sacrum. He noticed depressed affect.   Depression: Her PHQ-9 score today was indicating depression.  She feels hopeless about her situation at times, She feels like she has let her employers down. She sleeps well.  She adamantly denies any suicidal  thoughts. " I would never do that. " We discussed the long-term health effects of untreated depression and how treatment may help her meet her goals to be ambulatory again. She is wanting to get out of the chair to avoid a full blown sacral decubitus.    Wt Readings from Last 3 Encounters:  08/23/19 248 lb (112.5 kg)  06/17/19 243 lb (110.2 kg)  06/10/19 243 lb (110.2 kg)    Past Medical History:  Diagnosis Date  . Abnormal Pap smear    years ago/no biopsy  . Allergic rhinitis   . Aortic atherosclerosis   . Arthritis    back- lower  . Asthma   . Atrial fibrillation 12/2010   OFF XARELTO LAST MONTH DUE TO BLEEDING IN STOOL  . Bursitis of hip left  . Chronic anticoagulation 12/16/2018   CHADS VASC=2  for sex and H/O HTN- she is on Eliquis  . Chronic diastolic (congestive) heart failure    pt unaware of this  . COPD (chronic obstructive pulmonary disease) with chronic bronchitis   . Decreased pulses in feet   . Dysrhythmia    afib, followed by Dr. Stanford Breed   . Edema of both legs   . Endometrial adenocarcinoma 02/21/2013   S/p D and C, has mirena, being followed by GYN   . Esophagitis 02/02/2014   Distal, linear erosions, noted on endoscopy  . Essential hypertension 01/05/2007   Echo Nov June 2020- EF 50-55%, mild LVH, normal LA   . Fatty liver 01/07/2012  . Fibroid 1974   fibroid cyst on left fallopian tube  . Fibromyalgia   . Foot ulcer    AREA HEALED RIGHT FOOT  . Generalized anxiety disorder   . GERD (gastroesophageal reflux disease)   . Headache    occasional sinus headache   . Hepatomegaly   . History of blood transfusion JULY 2015  . History of cardiomegaly 12/06/2010   Noted on CT  . History of E. coli septicemia   . History of papillary thyroid carcinoma 04/07/2011  . Hypocalcemia 12/10/2016  . Hypokalemia 12/10/2016  . Hypoparathyroidism 01/07/2011  . Hypothyroidism   . Iron deficiency anemia secondary to malabsorption  05/10/2007   Qualifier: Diagnosis  of  By: Wynona Luna    . Kidney stone 16-Oct-2015   passed on their own  . Leukocytosis 03/24/2009   cta cheQualifier: Diagnosis of  By: Wynona Luna    . Major depressive disorder   . Mild neurocognitive disorder 06/28/2019  . Morbid obesity   . MRSA infection   . Nephrolithiasis 2/16, 9/16   SEES DR Risa Grill  . Osteomyelitis 06/14/2014  . PAF (paroxysmal atrial fibrillation) 02/05/2011   Documented 2012- NSR since.  On chronic anticoagulation.  . Pernicious anemia 02/20/2014   followed by Debbrah Alar  . Pneumonia   . PONV (postoperative nausea and vomiting)   . Pyelonephritis 11/24/2018  . Rash    thighs and back  . Seizures    infancy secondary to fever  . Staph aureus infection   . Thoracic  spondylosis 12/06/2010   Noted on CT  . Uterine cancer 2014   Mirena IUD  . UTI (urinary tract infection) 12/13/2018  . Vitamin B 12 deficiency   . Vitamin D deficiency 05/10/2007   Qualifier: Diagnosis of  By: Wynona Luna     Past Surgical History:  Procedure Laterality Date  . AMPUTATION Right 05/18/2014   Procedure: RIGHT FIFTH RAY AMPUTATION FOOT;  Surgeon: Wylene Simmer, MD;  Location: [facility];  Service: Orthopedics;  Laterality: Right;  . APPENDECTOMY    . BUNIONECTOMY     bilateral  . COLONOSCOPY WITH PROPOFOL N/A 02/02/2014   Procedure: COLONOSCOPY WITH PROPOFOL;  Surgeon: Irene Shipper, MD;  Location: [facility];  Service: Endoscopy;  Laterality: N/A;  . cyst on ovary removed     . CYSTOSCOPY W/ RETROGRADES  03/03/2012   Procedure: CYSTOSCOPY WITH RETROGRADE PYELOGRAM;  Surgeon: Bernestine Amass, MD;  Location: [facility];  Service: Urology;  Laterality: Bilateral;  . CYSTOSCOPY W/ URETERAL STENT PLACEMENT Right 11/25/2017   Procedure: CYSTOSCOPY WITH RETROGRADE RIGHT URETERAL STENT PLACEMENT;  Surgeon: Franchot Gallo, MD;  Location: [facility];  Service: Urology;  Laterality: Right;  . CYSTOSCOPY W/ URETERAL STENT PLACEMENT Right 01/15/2018   Procedure: RIGHT STENT EXCHANGE;  Surgeon: Ardis Hughs, MD;  Location: [facility];  Service: Urology;  Laterality: Right;  . CYSTOSCOPY WITH RETROGRADE PYELOGRAM, URETEROSCOPY AND STENT PLACEMENT Left 08/25/2012   Procedure: CYSTOSCOPY WITH RETROGRADE PYELOGRAM, URETEROSCOPY;  Surgeon: Bernestine Amass, MD;  Location: [facility];  Service: Urology;  Laterality: Left;  . CYSTOSCOPY WITH STENT PLACEMENT Left 01/15/2018   Procedure: LEFT STENT PLACEMENT;  Surgeon: Ardis Hughs, MD;  Location: [facility];  Service: Urology;  Laterality: Left;  . CYSTOSCOPY WITH STENT PLACEMENT Right 01/22/2018   Procedure: RIGHT STENT REMOVAL;  Surgeon: Ardis Hughs, MD;  Location: [facility];  Service: Urology;  Laterality: Right;  . CYSTOSCOPY/URETEROSCOPY/HOLMIUM  LASER/STENT PLACEMENT Right 12/23/2017   Procedure: RIGHT URETEROSCOPY STONE REMOVAL, HOLMIUM LASER, RIGHT /STENT EXCHANGE;  Surgeon: Ardis Hughs, MD;  Location: [facility];  Service: Urology;  Laterality: Right;  . CYSTOSCOPY/URETEROSCOPY/HOLMIUM LASER/STENT PLACEMENT Left 01/22/2018   Procedure: LEFT URETEROSCOPY STONE REMOVAL HOLMIUM LASER LEFT STENT Freddi Starr;  Surgeon: Ardis Hughs, MD;  Location: [facility];  Service: Urology;  Laterality: Left;  . DILATION AND CURETTAGE OF UTERUS N/A 02/21/2013   Procedure: DILATATION AND CURETTAGE;  Surgeon: Lyman Speller, MD;  Location: [facility];  Service: Gynecology;  Laterality: N/A;  . DILATION AND CURETTAGE OF UTERUS N/A 04/09/2015   Procedure: [place] IUD removal;  Surgeon: Megan Salon,  MD;  Location: [facility];  Service: Gynecology;  Laterality: N/A;  Patient weight 307lbs  . ESOPHAGOGASTRODUODENOSCOPY (EGD) WITH PROPOFOL N/A 02/02/2014   Procedure: ESOPHAGOGASTRODUODENOSCOPY (EGD) WITH PROPOFOL;  Surgeon: Irene Shipper, MD;  Location: [facility];  Service: Endoscopy;  Laterality: N/A;  . GASTRIC BYPASS  1974  . HOLMIUM LASER APPLICATION Left 0000000   Procedure: HOLMIUM LASER APPLICATION;  Surgeon: Bernestine Amass, MD;  Location: [facility];  Service: Urology;  Laterality: Left;  . LITHOTRIPSY  03/2012  . LITHOTRIPSY  2/16  . THYROIDECTOMY  05/15/2010  . TONSILLECTOMY AND ADENOIDECTOMY    . URETEROSCOPY  03/03/2012   Procedure: URETEROSCOPY;  Surgeon: Bernestine Amass, MD;  Location: [facility];  Service: Urology;  Laterality: Left;  . URETEROSCOPY WITH HOLMIUM LASER LITHOTRIPSY Bilateral 01/15/2018   Procedure: CYSTOSCOPY/BILATERAL URETEROSCOPY WITH HOLMIUM LASER LITHOTRIPSY STONE REMOVAL;  Surgeon: Ardis Hughs, MD;  Location: [facility];  Service: Urology;  Laterality: Bilateral;    Family History  Problem Relation Age of Onset  . Hypertension Father   . Diabetes Father   . Lung cancer Father   .  Heart attack Father        MI at age 72  . Hypertension Mother   . Hyperthyroidism Mother   . Heart disease Mother   . Heart attack Mother        MI at age 43  . Asthma Brother   . Hypertension Brother        younger  . Heart disease Brother        older  . Dementia Paternal Aunt        Unspecified type; symptoms emerged with advanced age  . Colon cancer Neg Hx   . Esophageal cancer Neg Hx   . Stomach cancer Neg Hx   . Kidney disease Neg Hx   . Liver disease Neg Hx     Social History   Socioeconomic History  . Marital status: Single    Spouse name: Not on file  . Number of children: 0  . Years of education: 34  . Highest education level: Some college, no degree  Occupational History  . Occupation: works in Insurance claims handler  . Occupation: DESIGN COMPUTER CHIP    Employer: [employer]  Tobacco Use  . Smoking status: Former Smoker    Packs/day: 0.50    Years: 25.00    Pack years: 12.50    Types: Cigarettes    Start date: 12/24/1970    Quit date: 05/27/1995    Years since quitting: 24.2  . Smokeless tobacco: Never Used  . Tobacco comment: quit smoking 23 years ago  Substance and Sexual Activity  . Alcohol use: Yes    Alcohol/week: 0.0 standard drinks    Comment: 1/2 glass per month  . Drug use: No  . Sexual activity: Yes    Partners: Male    Birth control/protection: Post-menopausal  Other Topics Concern  . Not on file  Social History Narrative   Occupation: works in Insurance claims handler - Field seismologist   Single       Former Smoker - quit tobacco 12 years ago.  She was light smoker for 10 years.             College edu      Right handed      Lives alone in one story home. Has steps to enter home.      Social Determinants of Health   Financial Resource Strain:   .  Difficulty of Paying Living Expenses:   Food Insecurity:   . Worried About Charity fundraiser in the Last Year:   . Arboriculturist in the Last Year:   Transportation Needs:   .  Film/video editor (Medical):   [name] Lack of Transportation (Non-Medical):   Physical Activity:   . Days of Exercise per Week:   . Minutes of Exercise per Session:   Stress:   . Feeling of Stress :   Social Connections:   . Frequency of Communication with Friends and Family:   . Frequency of Social Gatherings with Friends and Family:   . Attends Religious Services:   . Active Member of Clubs or Organizations:   . Attends Archivist Meetings:   [name] Marital Status:   Intimate Partner Violence:   . Fear of Current or Ex-Partner:   . Emotionally Abused:   [name] Physically Abused:   . Sexually Abused:     Outpatient Medications Prior to Visit  Medication Sig Dispense Refill  . acetaminophen (TYLENOL) 325 MG tablet Take 2 tablets (650 mg total) by mouth every 6 (six) hours as needed for mild pain or moderate pain. 60 tablet 0  . albuterol (PROVENTIL HFA;VENTOLIN HFA) 108 (90 Base) MCG/ACT inhaler Inhale 2 puffs into the lungs every 6 (six) hours as needed for wheezing or shortness of breath. 1 Inhaler 5  . albuterol (PROVENTIL) (2.5 MG/3ML) 0.083% nebulizer solution Take 3 mLs (2.5 mg total) by nebulization every 6 (six) hours as needed for wheezing or shortness of breath. 150 mL 1  . calcitRIOL (ROCALTROL) 0.5 MCG capsule Take 2 mcg by mouth daily.     . cyanocobalamin (,VITAMIN B-12,) 1000 MCG/ML injection ADMINISTER 1ML (1,000 MCG) UNDER THE SKIN EVERY 30 DAYS 3 mL 4  . desoximetasone (TOPICORT) 0.05 % cream Apply topically 2 (two) times daily as needed. 60 g 0  . diclofenac sodium (VOLTAREN) 1 % GEL Apply 2 g topically 4 (four) times daily. 100 g 3  . diltiazem (CARDIZEM CD) 120 MG 24 hr capsule Take 1 capsule (120 mg total) by mouth daily. 90 capsule 1  . diphenhydrAMINE (BENADRYL) 25 mg capsule Take 25 mg by mouth every 6 (six) hours as needed for itching.     [name] EPINEPHrine (EPIPEN 2-PAK) 0.3 mg/0.3 mL IJ SOAJ injection Inject 0.3 mg into the muscle once as needed for anaphylaxis  (severe allergic reaction).     . furosemide (LASIX) 40 MG tablet Take 1 tablet (40 mg total) by mouth 2 (two) times daily. 270 tablet 3  . hydrocortisone cream 1 % Apply topically 4 (four) times daily as needed for itching. 30 g 0  . levocetirizine (XYZAL) 5 MG tablet TAKE 1 TABLET BY MOUTH  EVERY EVENING 90 tablet 3  . levothyroxine (SYNTHROID, LEVOTHROID) 100 MCG tablet Take 400 mcg by mouth daily before breakfast.     . Melatonin 5 MG TABS Take 5 mg by mouth at bedtime as needed (for sleep).     . MYRBETRIQ 50 MG TB24 tablet Take 50 mg by mouth daily.    [name] nystatin (MYCOSTATIN/NYSTOP) powder Apply topically 2 (two) times daily. 45 g 1  . nystatin cream (MYCOSTATIN) Apply 1 application topically 2 (two) times daily. 30 g 2  . omeprazole (PRILOSEC) 40 MG capsule TAKE 1 CAPSULE BY MOUTH  DAILY 90 capsule 1  . ondansetron (ZOFRAN) 4 MG tablet Take 1 tablet (4 mg total) by mouth every 8 (eight) hours as needed  for nausea or vomiting. 30 tablet 0  . spironolactone (ALDACTONE) 25 MG tablet Take 1 tablet (25 mg total) by mouth daily. 90 tablet 2  . SYMBICORT 160-4.5 MCG/ACT inhaler INHALE 2 PUFFS BY MOUTH TWO TIMES DAILY 30.6 g 1  . Syringe/Needle, Disp, (SYRINGE 3CC/20GX1") 20G X 1" 3 ML MISC Use monthly as directed 50 each 0  . traMADol (ULTRAM) 50 MG tablet TAKE 1 TABLET(50 MG) BY MOUTH EVERY 12 HOURS AS NEEDED 30 tablet 0  . trimethoprim (TRIMPEX) 100 MG tablet Take 100 mg by mouth daily.    . Vitamin D, Ergocalciferol, (DRISDOL) 1.25 MG (50000 UT) CAPS capsule TAKE 1 CAPSULE BY MOUTH  EVERY MONDAY, WEDNESDAY,  AND FRIDAY. 39 capsule 1  . zafirlukast (ACCOLATE) 20 MG tablet Take 1 tablet (20 mg total) by mouth 2 (two) times daily before a meal. 120 tablet 0   No facility-administered medications prior to visit.    Allergies  Allergen Reactions  . Other Anaphylaxis    Reaction to tree nuts  . Amoxicillin-Pot Clavulanate Other (See Comments)    headache  . Ciprofloxacin Nausea Only and Rash   . Food Hives and Other (See Comments)    Potato  . Keflex [Cephalexin] Rash  . Penicillins Rash    Headache. And hives - Dose not tolerate Cephalosporin either   Has patient had a PCN reaction causing immediate rash, facial/tongue/throat swelling, SOB or lightheadedness with hypotension: No Has patient had a PCN reaction causing severe rash involving mucus membranes or skin necrosis: No Has patient had a PCN reaction that required hospitalization: No Has patient had a PCN reaction occurring within the last 10 years: Yes (reaction was May 2020) If all of the above answers are "NO", then may proce  . Rocephin [Ceftriaxone] Rash  . Tomato Hives    Review of Systems  Constitutional: Negative for appetite change and fever.  HENT: Negative.  Negative for congestion.   Eyes: Negative.   Respiratory: Negative for cough and shortness of breath.   Cardiovascular: Negative for chest pain.  Gastrointestinal: Negative for abdominal pain and blood in stool.  Genitourinary: Negative for difficulty urinating.  Musculoskeletal:       She rarely takes tramadol for pain- maybe 1-2 times per month. She has started PT x 2 so far and  likes it. She may need to use the NSAID topical more often if her knees start to bother her as she begins to follow the exercise plan.     Neurological: Negative for headaches.  Hematological: Bruises/bleeds easily.  Psychiatric/Behavioral:       See HPI. Admits to depression and PHQ-9 is 14. Denies any suicidal thoughts.      Objective:    Physical Exam  Constitutional: She is oriented to person, place, and time. She appears well-developed.  HENT:  Head: Normocephalic and atraumatic.  Musculoskeletal:     Comments: In [facility] and stands briefly, not ambulating in the room. Left foot in boot for chronic toe problems.    Neurological: She is alert and oriented to person, place, and time.  Skin: Skin is warm and dry.  Let dorsal forearm with bruising  Left dorsal hand  with bruising   Psychiatric: Her behavior is normal.  Depressed affect.     Media Information   Document Information  Photos    08/23/2019 14:22  Attached To:  Office Visit on 08/23/19 with Jamie Regal, NP  Source Information    BP (!) 120/47 (BP Location: Left Arm,  Patient Position: Sitting, Cuff Size: Large)   Pulse 60   Temp (!) 97.4 F (36.3 C) (Temporal)   Resp 18   Ht 5\' 4"  (1.626 m)   Wt 248 lb (112.5 kg)   LMP 06/11/2012   SpO2 98%   BMI 42.57 kg/m  Wt Readings from Last 3 Encounters:  08/23/19 248 lb (112.5 kg)  06/17/19 243 lb (110.2 kg)  06/10/19 243 lb (110.2 kg)      Assessment & Plan:   Problem List Items Addressed This Visit      Other   Major depressive disorder   Relevant Medications   DULoxetine (CYMBALTA) 30 MG capsule    Other Visit Diagnoses    Superficial bruising    -  Primary   Relevant Orders   CBC with Differential/Platelet      Meds ordered this encounter  Medications  . DULoxetine (CYMBALTA) 30 MG capsule    Sig: Take one 30 mg tablet by mouth once a day for one week. Then increase to one 30 mg tablet by mouth TWICE a day and stay at that dose thereafter.    Dispense:  60 capsule    Refill:  0    Order Specific Question:   Supervising Provider    Answer:   Einar Pheasant [id number]   Your arm bruising is from trauma- striking your arm and easy bruising. Change your table height and try not to keep hitting the arm. I spoke to her and she is on ELIQUIS 5 mg BID and I updated her med list. She reports she changed pharmacy to [facility] on [address] noted in the chart. Will check a CBC.   I spent 35 minutes with the patient today and greater than 50% was spent in counseling, coordination of care and education.We discussed the results of her depression screen in detail today. She voices understanding about how depression may be keeping her form reaching her goal to walk and be able to get out of the chair. She has a bed on  the ground floor that she would like to sleep in. Begin Cymbalta 30 mg daily for 1 week and will advance to twice daily to follow. This is wt neutral and it is important for her not to gain wt. She was advised to avoid taking it with daily tramadol as a drug-drug interaction is possible. The patient reports that she only takes tramadol a few times a month. Patient expresses understanding and all questions were answered  This visit occurred during the SARS-CoV-2 public health emergency.  Safety protocols were in place, including screening questions prior to the visit, additional usage of staff PPE, and extensive cleaning of exam room while observing appropriate contact time as indicated for disinfecting solutions.   Follow-up: Return in about 4 weeks (around 09/20/2019).    Denice Paradise, NP

## 2019-08-24 ENCOUNTER — Encounter: Payer: Self-pay | Admitting: Nurse Practitioner

## 2019-08-24 ENCOUNTER — Ambulatory Visit: Payer: 59 | Admitting: Cardiology

## 2019-08-24 ENCOUNTER — Encounter: Payer: Self-pay | Admitting: Family

## 2019-08-24 LAB — CBC WITH DIFFERENTIAL/PLATELET
Basophils Absolute: 0 10*3/uL (ref 0.0–0.1)
Basophils Relative: 0.7 % (ref 0.0–3.0)
Eosinophils Absolute: 0.1 10*3/uL (ref 0.0–0.7)
Eosinophils Relative: 1.2 % (ref 0.0–5.0)
HCT: 29.3 % — ABNORMAL LOW (ref 36.0–46.0)
Hemoglobin: 9.4 g/dL — ABNORMAL LOW (ref 12.0–15.0)
Lymphocytes Relative: 7.4 % — ABNORMAL LOW (ref 12.0–46.0)
Lymphs Abs: 0.5 10*3/uL — ABNORMAL LOW (ref 0.7–4.0)
MCHC: 32 g/dL (ref 30.0–36.0)
MCV: 88.9 fl (ref 78.0–100.0)
Monocytes Absolute: 0.3 10*3/uL (ref 0.1–1.0)
Monocytes Relative: 4.2 % (ref 3.0–12.0)
Neutro Abs: 5.9 10*3/uL (ref 1.4–7.7)
Neutrophils Relative %: 86.5 % — ABNORMAL HIGH (ref 43.0–77.0)
Platelets: 127 10*3/uL — ABNORMAL LOW (ref 150.0–400.0)
RBC: 3.3 Mil/uL — ABNORMAL LOW (ref 3.87–5.11)
RDW: 18.3 % — ABNORMAL HIGH (ref 11.5–15.5)
WBC: 6.8 10*3/uL (ref 4.0–10.5)

## 2019-08-24 NOTE — Telephone Encounter (Signed)
IFOB mailed to patient's address with instructions

## 2019-08-24 NOTE — Telephone Encounter (Signed)
Labs return with worsening chronic anemia. She put herself back on Eliquis 5 mg BID in Jan. Following her cardiologist's recommendations.   She has a hx of elevated ferritin and low serum iron. She has a hx of pernicious anemia but recent normal B12. She has last received IV iron in 2020. She had an incomplete colonoscopy to hepatic flexure in 2015 with recommendation for virtual colonoscopy and I do not see that resulted. She tells me that it was never done.   PLAN: I called her and let her know the Hgb is lower. I recommend and placed these orders. She is agreeable to Hematology and FOBT.   1. Referrals to HEMATOLOGY to Dr. Burney Gauze. She agrees.  2. FOBT -she agrees to the stool sample.   3. She needs referral to GI as well in the future. She declined the GI referral per scheduler.

## 2019-08-24 NOTE — Telephone Encounter (Signed)
Labs return with worsening chronic anemia. She put herself back on Eliquis 5 mg BID in Jan. Following her cardiologist's recommendations.   She has a hx of elevated ferritin and low serum iron. She has a hx of pernicious anemia but recent normal B12. She had an incomplete colonoscopy to hepatic flexure in 2015 with recommendation for virtual colonoscopy and I do not see that resulted.   PLAN: Please call the patient and let her know the Hgb is lower. I recommend and placed these orders:   1. Referrals to HEMATOLOGY and GI 2. FOBT

## 2019-08-26 ENCOUNTER — Other Ambulatory Visit: Payer: Self-pay | Admitting: Family

## 2019-08-26 DIAGNOSIS — D500 Iron deficiency anemia secondary to blood loss (chronic): Secondary | ICD-10-CM

## 2019-08-26 DIAGNOSIS — D631 Anemia in chronic kidney disease: Secondary | ICD-10-CM

## 2019-08-29 ENCOUNTER — Inpatient Hospital Stay: Payer: 59 | Attending: Hematology & Oncology

## 2019-08-29 ENCOUNTER — Inpatient Hospital Stay (HOSPITAL_BASED_OUTPATIENT_CLINIC_OR_DEPARTMENT_OTHER): Payer: 59 | Admitting: Family

## 2019-08-29 ENCOUNTER — Other Ambulatory Visit: Payer: 59

## 2019-08-29 ENCOUNTER — Ambulatory Visit: Payer: 59 | Admitting: Family

## 2019-08-29 ENCOUNTER — Other Ambulatory Visit: Payer: Self-pay

## 2019-08-29 ENCOUNTER — Encounter: Payer: Self-pay | Admitting: Family

## 2019-08-29 VITALS — BP 120/45 | HR 53 | Resp 18

## 2019-08-29 VITALS — BP 134/71 | HR 61 | Temp 96.9°F | Resp 18 | Ht 64.0 in | Wt 248.0 lb

## 2019-08-29 DIAGNOSIS — D510 Vitamin B12 deficiency anemia due to intrinsic factor deficiency: Secondary | ICD-10-CM | POA: Diagnosis not present

## 2019-08-29 DIAGNOSIS — Z9884 Bariatric surgery status: Secondary | ICD-10-CM | POA: Diagnosis not present

## 2019-08-29 DIAGNOSIS — K912 Postsurgical malabsorption, not elsewhere classified: Secondary | ICD-10-CM | POA: Diagnosis not present

## 2019-08-29 DIAGNOSIS — R609 Edema, unspecified: Secondary | ICD-10-CM | POA: Insufficient documentation

## 2019-08-29 DIAGNOSIS — Z880 Allergy status to penicillin: Secondary | ICD-10-CM | POA: Insufficient documentation

## 2019-08-29 DIAGNOSIS — M7989 Other specified soft tissue disorders: Secondary | ICD-10-CM | POA: Diagnosis not present

## 2019-08-29 DIAGNOSIS — D509 Iron deficiency anemia, unspecified: Secondary | ICD-10-CM | POA: Diagnosis present

## 2019-08-29 DIAGNOSIS — I482 Chronic atrial fibrillation, unspecified: Secondary | ICD-10-CM | POA: Insufficient documentation

## 2019-08-29 DIAGNOSIS — Z7901 Long term (current) use of anticoagulants: Secondary | ICD-10-CM | POA: Insufficient documentation

## 2019-08-29 DIAGNOSIS — R109 Unspecified abdominal pain: Secondary | ICD-10-CM | POA: Insufficient documentation

## 2019-08-29 DIAGNOSIS — Z79899 Other long term (current) drug therapy: Secondary | ICD-10-CM | POA: Diagnosis not present

## 2019-08-29 DIAGNOSIS — R002 Palpitations: Secondary | ICD-10-CM | POA: Diagnosis not present

## 2019-08-29 DIAGNOSIS — D631 Anemia in chronic kidney disease: Secondary | ICD-10-CM

## 2019-08-29 DIAGNOSIS — D500 Iron deficiency anemia secondary to blood loss (chronic): Secondary | ICD-10-CM

## 2019-08-29 DIAGNOSIS — G629 Polyneuropathy, unspecified: Secondary | ICD-10-CM | POA: Diagnosis not present

## 2019-08-29 DIAGNOSIS — R5383 Other fatigue: Secondary | ICD-10-CM | POA: Diagnosis not present

## 2019-08-29 DIAGNOSIS — Z881 Allergy status to other antibiotic agents status: Secondary | ICD-10-CM | POA: Diagnosis not present

## 2019-08-29 DIAGNOSIS — D508 Other iron deficiency anemias: Secondary | ICD-10-CM | POA: Diagnosis present

## 2019-08-29 LAB — CBC WITH DIFFERENTIAL (CANCER CENTER ONLY)
Abs Immature Granulocytes: 0.14 10*3/uL — ABNORMAL HIGH (ref 0.00–0.07)
Basophils Absolute: 0 10*3/uL (ref 0.0–0.1)
Basophils Relative: 1 %
Eosinophils Absolute: 0.1 10*3/uL (ref 0.0–0.5)
Eosinophils Relative: 1 %
HCT: 30 % — ABNORMAL LOW (ref 36.0–46.0)
Hemoglobin: 8.9 g/dL — ABNORMAL LOW (ref 12.0–15.0)
Immature Granulocytes: 2 %
Lymphocytes Relative: 9 %
Lymphs Abs: 0.6 10*3/uL — ABNORMAL LOW (ref 0.7–4.0)
MCH: 26.9 pg (ref 26.0–34.0)
MCHC: 29.7 g/dL — ABNORMAL LOW (ref 30.0–36.0)
MCV: 90.6 fL (ref 80.0–100.0)
Monocytes Absolute: 0.3 10*3/uL (ref 0.1–1.0)
Monocytes Relative: 5 %
Neutro Abs: 5.5 10*3/uL (ref 1.7–7.7)
Neutrophils Relative %: 82 %
Platelet Count: 175 10*3/uL (ref 150–400)
RBC: 3.31 MIL/uL — ABNORMAL LOW (ref 3.87–5.11)
RDW: 16.8 % — ABNORMAL HIGH (ref 11.5–15.5)
WBC Count: 6.7 10*3/uL (ref 4.0–10.5)
nRBC: 0 % (ref 0.0–0.2)

## 2019-08-29 LAB — CMP (CANCER CENTER ONLY)
ALT: 27 U/L (ref 0–44)
AST: 18 U/L (ref 15–41)
Albumin: 3.9 g/dL (ref 3.5–5.0)
Alkaline Phosphatase: 135 U/L — ABNORMAL HIGH (ref 38–126)
Anion gap: 11 (ref 5–15)
BUN: 48 mg/dL — ABNORMAL HIGH (ref 8–23)
CO2: 28 mmol/L (ref 22–32)
Calcium: 10.4 mg/dL — ABNORMAL HIGH (ref 8.9–10.3)
Chloride: 99 mmol/L (ref 98–111)
Creatinine: 1.41 mg/dL — ABNORMAL HIGH (ref 0.44–1.00)
GFR, Est AFR Am: 46 mL/min — ABNORMAL LOW (ref >60)
GFR, Estimated: 40 mL/min — ABNORMAL LOW (ref >60)
Glucose, Bld: 90 mg/dL (ref 70–99)
Potassium: 3.5 mmol/L (ref 3.5–5.1)
Sodium: 138 mmol/L (ref 135–145)
Total Bilirubin: 0.4 mg/dL (ref 0.3–1.2)
Total Protein: 7.2 g/dL (ref 6.5–8.1)

## 2019-08-29 LAB — RETICULOCYTES
Immature Retic Fract: 21.8 % — ABNORMAL HIGH (ref 2.3–15.9)
RBC.: 3.29 MIL/uL — ABNORMAL LOW (ref 3.87–5.11)
Retic Count, Absolute: 123 10*3/uL (ref 19.0–186.0)
Retic Ct Pct: 3.7 % — ABNORMAL HIGH (ref 0.4–3.1)

## 2019-08-29 LAB — IRON AND TIBC
Iron: 37 ug/dL — ABNORMAL LOW (ref 41–142)
Saturation Ratios: 11 % — ABNORMAL LOW (ref 21–57)
TIBC: 350 ug/dL (ref 236–444)
UIBC: 313 ug/dL (ref 120–384)

## 2019-08-29 LAB — FERRITIN: Ferritin: 1957 ng/mL — ABNORMAL HIGH (ref 11–307)

## 2019-08-29 LAB — SAVE SMEAR(SSMR), FOR PROVIDER SLIDE REVIEW

## 2019-08-29 MED ORDER — SODIUM CHLORIDE 0.9 % IV SOLN
Freq: Once | INTRAVENOUS | Status: DC
  Filled 2019-08-29: qty 250

## 2019-08-29 MED ORDER — SODIUM CHLORIDE 0.9 % IV SOLN
200.0000 mg | Freq: Once | INTRAVENOUS | Status: AC
  Administered 2019-08-29: 200 mg via INTRAVENOUS
  Filled 2019-08-29: qty 10

## 2019-08-29 MED ORDER — SODIUM CHLORIDE 0.9 % IV SOLN
INTRAVENOUS | Status: DC
  Filled 2019-08-29: qty 250

## 2019-08-29 MED ORDER — SODIUM CHLORIDE 0.9 % IV SOLN: 510.0000 mg | Freq: Once | INTRAVENOUS | Status: DC

## 2019-08-29 NOTE — Patient Instructions (Addendum)

## 2019-08-29 NOTE — Progress Notes (Addendum)
Hematology and Oncology Follow Up Visit  [name] [id number] 1955-06-06 64 y.o. 08/29/2019   Principle Diagnosis:  Iron deficiency anemia Pernicious anemia Gastric bypass with malabsorption Atrial fibrillation - chronic  Current Therapy:        IV iron as indicated - last received in November 2018 Vitamin B12 1 mg IM every month - with PCP [name] 5 mg p.o. twice daily   Interim History:  [name] is here today with her niece [name] for follow-up. She is in a wheelchair today she states due to degenerative disease of the spine. She is in PT 2 days a week with the goal of walking and being able to drive again! She has not had a fall in about a year. No syncopal episodes to report.  Hgb is down to 8.9. She is symptomatic with fatigue and chewing lots of ice.  She has occasional mid abdominal discomfort. This comes and goes. She has an appointment with GI next week on 4/15 for further evaluation.  She denies noting any blood loss. No dark tarry stools, maroon or bright red blood.  She bruises easily on [name].  She has occasional palpitations due to the atrial fib.  No fever, chills, n/v, cough, rash, dizziness, SOB, chest pain, or changes in bowel or bladder habits.  She has chronic swelling in her lower extremities. Pedal pulses are +2.  Neuropathy in her hands and feet is stable/unchanged.  She has maintained a good appetite and is staying well hydrated throughout the day. She is on lasix 40 mg PO BID to help reduce fluid retention. Weight yesterday was stable at 248 lbs  ECOG Performance Status: 1 - Symptomatic but completely ambulatory  Medications:  Allergies as of 08/29/2019      Reactions   Other Anaphylaxis   Reaction to tree nuts   Amoxicillin-pot Clavulanate Other (See Comments)   headache   Ciprofloxacin Nausea Only, Rash   Food Hives, Other (See Comments)   Potato   Keflex [cephalexin] Rash   Penicillins Rash   Headache. And hives - Dose not tolerate  Cephalosporin either   Has patient had a PCN reaction causing immediate rash, facial/tongue/throat swelling, SOB or lightheadedness with hypotension: No Has patient had a PCN reaction causing severe rash involving mucus membranes or skin necrosis: No Has patient had a PCN reaction that required hospitalization: No Has patient had a PCN reaction occurring within the last 10 years: Yes (reaction was May 2020) If all of the above answers are "NO", then may proce   Rocephin [ceftriaxone] Rash   Tomato Hives      Medication List       Accurate as of August 29, 2019  9:05 AM. If you have any questions, ask your nurse or doctor.        acetaminophen 325 MG tablet Commonly known as: TYLENOL Take 2 tablets (650 mg total) by mouth every 6 (six) hours as needed for mild pain or moderate pain.   albuterol 108 (90 Base) MCG/ACT inhaler Commonly known as: VENTOLIN HFA Inhale 2 puffs into the lungs every 6 (six) hours as needed for wheezing or shortness of breath.   albuterol (2.5 MG/3ML) 0.083% nebulizer solution Commonly known as: PROVENTIL Take 3 mLs (2.5 mg total) by nebulization every 6 (six) hours as needed for wheezing or shortness of breath.   calcitRIOL 0.5 MCG capsule Commonly known as: ROCALTROL Take 2 mcg by mouth daily.   cyanocobalamin 1000 MCG/ML injection Commonly known as: (VITAMIN B-12)  ADMINISTER 1ML (1,000 MCG) UNDER THE SKIN EVERY 30 DAYS   desoximetasone 0.05 % cream Commonly known as: TOPICORT Apply topically 2 (two) times daily as needed.   diclofenac sodium 1 % Gel Commonly known as: VOLTAREN Apply 2 g topically 4 (four) times daily.   diltiazem 120 MG 24 hr capsule Commonly known as: CARDIZEM CD Take 1 capsule (120 mg total) by mouth daily.   diphenhydrAMINE 25 mg capsule Commonly known as: BENADRYL Take 25 mg by mouth every 6 (six) hours as needed for itching.   DULoxetine 30 MG capsule Commonly known as: Cymbalta Take one 30 mg tablet by mouth once a  day for one week. Then increase to one 30 mg tablet by mouth TWICE a day and stay at that dose thereafter.   [name] 5 MG Tabs tablet Generic drug: apixaban Take 5 mg by mouth 2 (two) times daily. The patient re started herself on [name] after the Telemedicine visit with Dr. Stanford Breed 05/2019.   EpiPen 2-Pak 0.3 mg/0.3 mL Soaj injection Generic drug: EPINEPHrine Inject 0.3 mg into the muscle once as needed for anaphylaxis (severe allergic reaction).   furosemide 40 MG tablet Commonly known as: LASIX Take 1 tablet (40 mg total) by mouth 2 (two) times daily.   hydrocortisone cream 1 % Apply topically 4 (four) times daily as needed for itching.   levocetirizine 5 MG tablet Commonly known as: XYZAL TAKE 1 TABLET BY MOUTH  EVERY EVENING   levothyroxine 100 MCG tablet Commonly known as: SYNTHROID Take 400 mcg by mouth daily before breakfast.   melatonin 5 MG Tabs Take 5 mg by mouth at bedtime as needed (for sleep).   Myrbetriq 50 MG Tb24 tablet Generic drug: mirabegron ER Take 50 mg by mouth daily.   nystatin powder Commonly known as: MYCOSTATIN/NYSTOP Apply topically 2 (two) times daily.   nystatin cream Commonly known as: MYCOSTATIN Apply 1 application topically 2 (two) times daily.   omeprazole 40 MG capsule Commonly known as: PRILOSEC TAKE 1 CAPSULE BY MOUTH  DAILY   ondansetron 4 MG tablet Commonly known as: Zofran Take 1 tablet (4 mg total) by mouth every 8 (eight) hours as needed for nausea or vomiting.   spironolactone 25 MG tablet Commonly known as: Aldactone Take 1 tablet (25 mg total) by mouth daily.   Symbicort 160-4.5 MCG/ACT inhaler Generic drug: budesonide-formoterol INHALE 2 PUFFS BY MOUTH TWO TIMES DAILY   SYRINGE 3CC/20GX1" 20G X 1" 3 ML Misc Use monthly as directed   traMADol 50 MG tablet Commonly known as: ULTRAM TAKE 1 TABLET(50 MG) BY MOUTH EVERY 12 HOURS AS NEEDED   trimethoprim 100 MG tablet Commonly known as: TRIMPEX Take 100 mg by  mouth daily.   Vitamin D (Ergocalciferol) 1.25 MG (50000 UNIT) Caps capsule Commonly known as: DRISDOL TAKE 1 CAPSULE BY MOUTH  EVERY MONDAY, WEDNESDAY,  AND FRIDAY.   zafirlukast 20 MG tablet Commonly known as: ACCOLATE Take 1 tablet (20 mg total) by mouth 2 (two) times daily before a meal.       Allergies:  Allergies  Allergen Reactions  . Other Anaphylaxis    Reaction to tree nuts  . Amoxicillin-Pot Clavulanate Other (See Comments)    headache  . Ciprofloxacin Nausea Only and Rash  . Food Hives and Other (See Comments)    Potato  . Keflex [Cephalexin] Rash  . Penicillins Rash    Headache. And hives - Dose not tolerate Cephalosporin either   Has patient had a PCN reaction causing  immediate rash, facial/tongue/throat swelling, SOB or lightheadedness with hypotension: No Has patient had a PCN reaction causing severe rash involving mucus membranes or skin necrosis: No Has patient had a PCN reaction that required hospitalization: No Has patient had a PCN reaction occurring within the last 10 years: Yes (reaction was May 2020) If all of the above answers are "NO", then may proce  . Rocephin [Ceftriaxone] Rash  . Tomato Hives    Past Medical History, Surgical history, Social history, and Family History were reviewed and updated.  Review of Systems: All other 10 point review of systems is negative.   Physical Exam:  vitals were not taken for this visit.   Wt Readings from Last 3 Encounters:  08/23/19 248 lb (112.5 kg)  06/17/19 243 lb (110.2 kg)  06/10/19 243 lb (110.2 kg)    Ocular: Sclerae unicteric, pupils equal, round and reactive to light Ear-nose-throat: Oropharynx clear, dentition fair Lymphatic: No cervical or supraclavicular adenopathy Lungs no rales or rhonchi, good excursion bilaterally Heart regular rate and rhythm, no murmur appreciated Abd soft, nontender, positive bowel sounds, no liver or spleen tip palpated on exam, no fluid wave  MSK no focal spinal  tenderness, no joint edema Neuro: non-focal, well-oriented, appropriate affect Breasts: Deferred   Lab Results  Component Value Date   WBC 6.8 08/23/2019   HGB 9.4 (L) 08/23/2019   HCT 29.3 (L) 08/23/2019   MCV 88.9 08/23/2019   PLT 127.0 (L) 08/23/2019   Lab Results  Component Value Date   FERRITIN 1,439 (H) 04/20/2019   IRON 54 06/06/2019   TIBC 239 (L) 10/20/2018   UIBC 228 10/20/2018   IRONPCTSAT 5 (L) 10/20/2018   Lab Results  Component Value Date   RETICCTPCT 3.2 (H) 07/21/2018   RBC 3.30 (L) 08/23/2019   RETICCTABS 131.4 03/30/2015   No results found for: [facility] P[facility] Lab Results  Component Value Date   IGGSERUM 1250 06/09/2007   IGA 334 06/09/2007   IGMSERUM 244 06/09/2007   Lab Results  Component Value Date   TOTALPROTELP 7.0 06/09/2007     Chemistry      Component Value Date/Time   NA 136 05/31/2019 1629   NA 144 03/30/2017 1455   NA 139 07/30/2015 1302   K 4.0 05/31/2019 1629   K 3.7 03/30/2017 1455   K 3.5 07/30/2015 1302   CL 99 05/31/2019 1629   CL 99 03/30/2017 1455   CO2 26 05/31/2019 1629   CO2 30 03/30/2017 1455   CO2 25 07/30/2015 1302   BUN 40 (H) 05/31/2019 1629   BUN 14 03/30/2017 1455   BUN 16.8 07/30/2015 1302   CREATININE 1.48 (H) 05/31/2019 1629   CREATININE 1.17 (H) 04/20/2019 1538   CREATININE 0.8 07/30/2015 1302      Component Value Date/Time   CALCIUM 10.4 06/06/2019 1513   CALCIUM 8.7 03/30/2017 1455   CALCIUM 8.8 07/30/2015 1302   ALKPHOS 119 (H) 04/06/2019 1401   ALKPHOS 107 (H) 03/30/2017 1455   ALKPHOS 99 07/30/2015 1302   AST 24 04/06/2019 1401   AST 23 03/24/2018 0931   AST 36 (H) 07/30/2015 1302   ALT 38 (H) 04/06/2019 1401   ALT 28 03/24/2018 0931   ALT 28 03/30/2017 1455   ALT 68 (H) 07/30/2015 1302   BILITOT 0.6 04/06/2019 1401   BILITOT 0.6 03/24/2018 0931   BILITOT 0.41 07/30/2015 1302       Impression and Plan: Ms. Roethler is a very pleasant 64 yo caucasian  female with  iron deficiency anemia secondary to malabsorption after gastric bypass as well as pernicious anemia.  We will see how her is on studies look and bring her back in for infusion if needed.  We will plan to see her back in another 2 months.  She will contact our office with any questions or concerns. We can certainly see her sooner if needed.   Laverna Peace, NP 4/5/20219:05 AM

## 2019-08-30 ENCOUNTER — Other Ambulatory Visit: Payer: Self-pay | Admitting: Pharmacist

## 2019-08-30 ENCOUNTER — Ambulatory Visit: Payer: 59 | Admitting: Nurse Practitioner

## 2019-08-30 ENCOUNTER — Telehealth: Payer: Self-pay | Admitting: Family

## 2019-08-30 LAB — ERYTHROPOIETIN: Erythropoietin: 105.6 m[IU]/mL — ABNORMAL HIGH (ref 2.6–18.5)

## 2019-08-30 MED ORDER — APIXABAN 5 MG PO TABS: 5.0000 mg | ORAL_TABLET | Freq: Two times a day (BID) | ORAL | 1 refills | Status: DC

## 2019-08-30 NOTE — Telephone Encounter (Signed)
Called and spoke with patient regarding appointments added per 4/5 [facility] msg.  She was ok with all dates/times scheduled

## 2019-08-31 ENCOUNTER — Encounter: Payer: Self-pay | Admitting: Family

## 2019-09-01 ENCOUNTER — Telehealth: Payer: Self-pay | Admitting: Family

## 2019-09-01 DIAGNOSIS — I4891 Unspecified atrial fibrillation: Secondary | ICD-10-CM

## 2019-09-01 NOTE — Telephone Encounter (Signed)
See order(s).

## 2019-09-01 NOTE — Telephone Encounter (Signed)
Medication:DULoxetine (CYMBALTA) 30 MG capsule   Has the patient contacted their pharmacy? No. (If no, request that the patient contact the pharmacy for the refill.) (If yes, when and what did the pharmacy advise?)  Preferred Pharmacy (with phone number or street name):  [id number] [address], [place] Phone:  [phone]  Fax:  [phone]      Agent: Please be advised that RX refills may take up to 3 business days. We ask that you follow-up with your pharmacy.

## 2019-09-02 ENCOUNTER — Other Ambulatory Visit: Payer: Self-pay

## 2019-09-02 ENCOUNTER — Inpatient Hospital Stay: Payer: 59

## 2019-09-02 VITALS — BP 104/59 | HR 57 | Temp 96.8°F | Resp 19

## 2019-09-02 DIAGNOSIS — D509 Iron deficiency anemia, unspecified: Secondary | ICD-10-CM | POA: Diagnosis not present

## 2019-09-02 DIAGNOSIS — D508 Other iron deficiency anemias: Secondary | ICD-10-CM

## 2019-09-02 MED ORDER — SODIUM CHLORIDE 0.9 % IV SOLN
200.0000 mg | Freq: Once | INTRAVENOUS | Status: AC
  Administered 2019-09-02: 200 mg via INTRAVENOUS
  Filled 2019-09-02: qty 200

## 2019-09-02 MED ORDER — SODIUM CHLORIDE 0.9 % IV SOLN
Freq: Once | INTRAVENOUS | Status: AC
  Filled 2019-09-02: qty 250

## 2019-09-02 NOTE — Patient Instructions (Signed)

## 2019-09-05 NOTE — Telephone Encounter (Signed)
Rx sent 08-23-19, patient needs to keep appointment 09-23-19 before refill

## 2019-09-08 ENCOUNTER — Encounter: Payer: Self-pay | Admitting: Physician Assistant

## 2019-09-08 ENCOUNTER — Ambulatory Visit: Payer: 59 | Admitting: Physician Assistant

## 2019-09-08 VITALS — BP 92/50 | HR 70 | Temp 97.7°F | Ht 64.0 in

## 2019-09-08 DIAGNOSIS — K219 Gastro-esophageal reflux disease without esophagitis: Secondary | ICD-10-CM

## 2019-09-08 DIAGNOSIS — Z7901 Long term (current) use of anticoagulants: Secondary | ICD-10-CM

## 2019-09-08 DIAGNOSIS — I482 Chronic atrial fibrillation, unspecified: Secondary | ICD-10-CM

## 2019-09-08 DIAGNOSIS — D508 Other iron deficiency anemias: Secondary | ICD-10-CM | POA: Diagnosis not present

## 2019-09-08 MED ORDER — OMEPRAZOLE 40 MG PO CPDR: 40.0000 mg | DELAYED_RELEASE_CAPSULE | Freq: Two times a day (BID) | ORAL | 5 refills | Status: DC

## 2019-09-08 MED ORDER — NA SULFATE-K SULFATE-MG SULF 17.5-3.13-1.6 GM/177ML PO SOLN: 1.0000 | Freq: Once | ORAL | 0 refills | Status: DC

## 2019-09-08 MED ORDER — NA SULFATE-K SULFATE-MG SULF 17.5-3.13-1.6 GM/177ML PO SOLN: 1.0000 | Freq: Once | ORAL | 0 refills | Status: AC

## 2019-09-08 NOTE — Progress Notes (Signed)
Chief Complaint: IDA  HPI:    [name] is a 64 year old female, known to Dr. Henrene Pastor remotely, with a past medical history as listed below including A. fib on Eliquis twice daily and chronic diastolic CHF (123456 echo with EF 50-55%) who was referred to me by Debbrah Alar, NP for a complaint of IDA.      02/02/2014 EGD revealed distal esophagitis manifested by linear erosions which was thought to possibly explain her iron deficiency anemia in the face of chronic anticoagulation.  Colonoscopy was incomplete due to only fair prep.  Patient was supposed to complete a virtual colonoscopy but it does not look like this is ever done.    08/29/2019 hemoglobin 8.9 (9.42 weeks ago).  Iron low at 37% saturation low at 11.    08/29/2019 patient followed with oncology in regards to her iron deficiency anemia thought due to chronic blood loss.  Patient has been receiving IV iron.    Today, the patient presents to clinic accompanied by a family member who also assists with her history.  She explains that over the past few weeks to months she has been having trouble with reflux regardless of her Omeprazole 40 mg daily.  Tells me that she gets hoarseness and also feels this up in her throat.    Explains that she has been anemic for years and has been having to have iron infusions about every 8 months.  Nothing has really changed with this.  Explains that her doctors just wanted to "make sure" that nothing else was going on.    Denies fever, chills, blood in her stool or symptoms that awaken her from sleep.  Past Medical History:  Diagnosis Date  . Abnormal Pap smear    years ago/no biopsy  . Allergic rhinitis   . Aortic atherosclerosis   . Arthritis    back- lower  . Asthma   . Atrial fibrillation 12/2010   OFF XARELTO LAST MONTH DUE TO BLEEDING IN STOOL  . Bursitis of hip left  . Chronic anticoagulation 12/16/2018   CHADS VASC=2 for sex and H/O HTN- she is on Eliquis  . Chronic diastolic  (congestive) heart failure    pt unaware of this  . COPD (chronic obstructive pulmonary disease) with chronic bronchitis   . Decreased pulses in feet   . Dysrhythmia    afib, followed by Dr. Stanford Breed   . Edema of both legs   . Endometrial adenocarcinoma 02/21/2013   S/p D and C, has mirena, being followed by GYN   . Esophagitis 02/02/2014   Distal, linear erosions, noted on endoscopy  . Essential hypertension 01/05/2007   Echo Nov June 2020- EF 50-55%, mild LVH, normal LA   . Fatty liver 01/07/2012  . Fibroid 1974   fibroid cyst on left fallopian tube  . Fibromyalgia   . Foot ulcer    AREA HEALED RIGHT FOOT  . Generalized anxiety disorder   . GERD (gastroesophageal reflux disease)   . Headache    occasional sinus headache   . Hepatomegaly   . History of blood transfusion JULY 2015  . History of cardiomegaly 12/06/2010   Noted on CT  . History of E. coli septicemia   . History of papillary thyroid carcinoma 04/07/2011  . Hypocalcemia 12/10/2016  . Hypokalemia 12/10/2016  . Hypoparathyroidism 01/07/2011  . Hypothyroidism   . Iron deficiency anemia secondary to malabsorption  05/10/2007   Qualifier: Diagnosis of  By: Wynona Luna    .  Kidney stone 08/2015   passed on their own  . Leukocytosis 03/24/2009   cta cheQualifier: Diagnosis of  By: Wynona Luna    . Major depressive disorder   . Mild neurocognitive disorder 06/28/2019  . Morbid obesity   . MRSA infection   . Nephrolithiasis 2/16, 9/16   SEES DR Risa Grill  . Osteomyelitis 06/14/2014  . PAF (paroxysmal atrial fibrillation) 02/05/2011   Documented 2012- NSR since.  On chronic anticoagulation.  . Pernicious anemia 02/20/2014   followed by Debbrah Alar  . Pneumonia   . PONV (postoperative nausea and vomiting)   . Pyelonephritis 11/24/2018  . Rash    thighs and back  . Seizures    infancy secondary to fever  . Staph aureus infection   . Thoracic spondylosis 12/06/2010   Noted on CT  . Uterine cancer  2014   Mirena IUD  . UTI (urinary tract infection) 12/13/2018  . Vitamin B 12 deficiency   . Vitamin D deficiency 05/10/2007   Qualifier: Diagnosis of  By: Wynona Luna     Past Surgical History:  Procedure Laterality Date  . AMPUTATION Right 05/18/2014   Procedure: RIGHT FIFTH RAY AMPUTATION FOOT;  Surgeon: Wylene Simmer, MD;  Location: [facility];  Service: Orthopedics;  Laterality: Right;  . APPENDECTOMY    . BUNIONECTOMY     bilateral  . COLONOSCOPY WITH PROPOFOL N/A 02/02/2014   Procedure: COLONOSCOPY WITH PROPOFOL;  Surgeon: Irene Shipper, MD;  Location: [facility];  Service: Endoscopy;  Laterality: N/A;  . cyst on ovary removed     . CYSTOSCOPY W/ RETROGRADES  03/03/2012   Procedure: CYSTOSCOPY WITH RETROGRADE PYELOGRAM;  Surgeon: Bernestine Amass, MD;  Location: [facility];  Service: Urology;  Laterality: Bilateral;  . CYSTOSCOPY W/ URETERAL STENT PLACEMENT Right 11/25/2017   Procedure: CYSTOSCOPY WITH RETROGRADE RIGHT URETERAL STENT PLACEMENT;  Surgeon: Franchot Gallo, MD;  Location: [facility];  Service: Urology;  Laterality: Right;  . CYSTOSCOPY W/ URETERAL STENT PLACEMENT Right 01/15/2018   Procedure: RIGHT STENT EXCHANGE;  Surgeon: Ardis Hughs, MD;  Location: [facility];  Service: Urology;  Laterality: Right;  . CYSTOSCOPY WITH RETROGRADE PYELOGRAM, URETEROSCOPY AND STENT PLACEMENT Left 08/25/2012   Procedure: CYSTOSCOPY WITH RETROGRADE PYELOGRAM, URETEROSCOPY;  Surgeon: Bernestine Amass, MD;  [facility]: [facility];  Service: Urology;  Laterality: Left;  . CYSTOSCOPY WITH STENT PLACEMENT Left 01/15/2018   Procedure: LEFT STENT PLACEMENT;  Surgeon: Ardis Hughs, MD;  Location: [facility];  Service: Urology;  Laterality: Left;  . CYSTOSCOPY WITH STENT PLACEMENT Right 01/22/2018   Procedure: RIGHT STENT REMOVAL;  Surgeon: Ardis Hughs, MD;  Location: [facility];  Service: Urology;  Laterality: Right;  . CYSTOSCOPY/URETEROSCOPY/HOLMIUM LASER/STENT PLACEMENT Right 12/23/2017   Procedure: RIGHT  URETEROSCOPY STONE REMOVAL, HOLMIUM LASER, RIGHT /STENT EXCHANGE;  Surgeon: Ardis Hughs, MD;  [facility];  Service: Urology;  Laterality: Right;  . CYSTOSCOPY/URETEROSCOPY/HOLMIUM LASER/STENT PLACEMENT Left 01/22/2018   Procedure: LEFT URETEROSCOPY STONE REMOVAL HOLMIUM LASER LEFT STENT Freddi Starr;  Surgeon: Ardis Hughs, MD;  Location: [facility];  Service: Urology;  Laterality: Left;  . DILATION AND CURETTAGE OF UTERUS N/A 02/21/2013   Procedure: DILATATION AND CURETTAGE;  Surgeon: Lyman Speller, MD;  Location: [facility];  Service: Gynecology;  Laterality: N/A;  . DILATION AND CURETTAGE OF UTERUS N/A 04/09/2015   Procedure: Oriental IUD removal;  Surgeon: Megan Salon, MD;  Location: [facility];  Service: Gynecology;  Laterality: N/A;  Patient weight 307lbs  . ESOPHAGOGASTRODUODENOSCOPY (EGD) WITH PROPOFOL N/A 02/02/2014   Procedure: ESOPHAGOGASTRODUODENOSCOPY (EGD) WITH PROPOFOL;  Surgeon: Irene Shipper, MD;  Location: [facility];  Service: Endoscopy;  Laterality: N/A;  . GASTRIC BYPASS  1974  . HOLMIUM LASER APPLICATION Left 0000000   Procedure: HOLMIUM LASER APPLICATION;  Surgeon: Bernestine Amass, MD;  Location: [facility];  Service: Urology;  Laterality: Left;  . LITHOTRIPSY  03/2012  . LITHOTRIPSY  2/16  . THYROIDECTOMY  05/15/2010  . TONSILLECTOMY AND ADENOIDECTOMY    . URETEROSCOPY  03/03/2012   Procedure: URETEROSCOPY;  Surgeon: Bernestine Amass, MD;  Location: [facility];  Service: Urology;  Laterality: Left;  . URETEROSCOPY WITH HOLMIUM LASER LITHOTRIPSY Bilateral 01/15/2018   Procedure: CYSTOSCOPY/BILATERAL URETEROSCOPY WITH HOLMIUM LASER LITHOTRIPSY STONE REMOVAL;  Surgeon: Ardis Hughs, MD;  Location: [facility];  Service: Urology;  Laterality: Bilateral;    Current Outpatient Medications  Medication Sig Dispense Refill  . acetaminophen (TYLENOL) 325 MG tablet Take 2 tablets (650 mg total) by mouth every 6 (six) hours as needed for  mild pain or moderate pain. 60 tablet 0  . albuterol (PROVENTIL HFA;VENTOLIN HFA) 108 (90 Base) MCG/ACT inhaler Inhale 2 puffs into the lungs every 6 (six) hours as needed for wheezing or shortness of breath. 1 Inhaler 5  . albuterol (PROVENTIL) (2.5 MG/3ML) 0.083% nebulizer solution Take 3 mLs (2.5 mg total) by nebulization every 6 (six) hours as needed for wheezing or shortness of breath. 150 mL 1  . apixaban (ELIQUIS) 5 MG TABS tablet Take 1 tablet (5 mg total) by mouth 2 (two) times daily. 60 tablet 1  . calcitRIOL (ROCALTROL) 0.5 MCG capsule Take 2 mcg by mouth daily.     . cyanocobalamin (,VITAMIN B-12,) 1000 MCG/ML injection ADMINISTER 1ML (1,000 MCG) UNDER THE SKIN EVERY 30 DAYS 3 mL 4  . desoximetasone (TOPICORT) 0.05 % cream Apply topically 2 (two) times daily as needed. 60 g 0  . diclofenac sodium (VOLTAREN) 1 % GEL Apply 2 g topically 4 (four) times daily. 100 g 3  . diltiazem (CARDIZEM CD) 120 MG 24 hr capsule Take 1 capsule (120 mg total) by mouth daily. 90 capsule 1  . diphenhydrAMINE (BENADRYL) 25 mg capsule Take 25 mg by mouth every 6 (six) hours as needed for itching.     . DULoxetine (CYMBALTA) 30 MG capsule Take one 30 mg tablet by mouth once a day for one week. Then increase to one 30 mg tablet by mouth TWICE a day and stay at that dose thereafter. 60 capsule 0  . EPINEPHrine (EPIPEN 2-PAK) 0.3 mg/0.3 mL IJ SOAJ injection Inject 0.3 mg into the muscle once as needed for anaphylaxis (severe allergic reaction).     . furosemide (LASIX) 40 MG tablet Take 1 tablet (40 mg total) by mouth 2 (two) times daily. 270 tablet 3  . hydrocortisone cream 1 % Apply topically 4 (four) times daily as needed for itching. 30 g 0  . levocetirizine (XYZAL) 5 MG tablet TAKE 1 TABLET BY MOUTH  EVERY EVENING 90 tablet 3  . levothyroxine (SYNTHROID, LEVOTHROID) 100 MCG tablet Take 400 mcg by mouth daily before breakfast.     . Melatonin 5 MG TABS Take 5 mg by mouth at bedtime as needed (for sleep).     .  MYRBETRIQ 50 MG TB24 tablet Take 50 mg by mouth daily.    [name] nystatin (MYCOSTATIN/NYSTOP) powder Apply topically 2 (two) times daily. 45 g 1  . nystatin  cream (MYCOSTATIN) Apply 1 application topically 2 (two) times daily. 30 g 2  . omeprazole (PRILOSEC) 40 MG capsule TAKE 1 CAPSULE BY MOUTH  DAILY 90 capsule 1  . ondansetron (ZOFRAN) 4 MG tablet Take 1 tablet (4 mg total) by mouth every 8 (eight) hours as needed for nausea or vomiting. 30 tablet 0  . spironolactone (ALDACTONE) 25 MG tablet Take 1 tablet (25 mg total) by mouth daily. 90 tablet 2  . SYMBICORT 160-4.5 MCG/ACT inhaler INHALE 2 PUFFS BY MOUTH TWO TIMES DAILY 30.6 g 1  . Syringe/Needle, Disp, (SYRINGE 3CC/20GX1") 20G X 1" 3 ML MISC Use monthly as directed 50 each 0  . traMADol (ULTRAM) 50 MG tablet TAKE 1 TABLET(50 MG) BY MOUTH EVERY 12 HOURS AS NEEDED 30 tablet 0  . trimethoprim (TRIMPEX) 100 MG tablet Take 100 mg by mouth daily.    . Vitamin D, Ergocalciferol, (DRISDOL) 1.25 MG (50000 UT) CAPS capsule TAKE 1 CAPSULE BY MOUTH  EVERY MONDAY, WEDNESDAY,  AND FRIDAY. 39 capsule 1  . zafirlukast (ACCOLATE) 20 MG tablet Take 1 tablet (20 mg total) by mouth 2 (two) times daily before a meal. 120 tablet 0   No current facility-administered medications for this visit.    Allergies as of 09/08/2019 - Review Complete 08/29/2019  Allergen Reaction Noted  . Other Anaphylaxis 04/01/2013  . Amoxicillin-pot clavulanate Other (See Comments) 08/29/2019  . Ciprofloxacin Nausea Only and Rash 12/07/2017  . Food Hives and Other (See Comments) 05/05/2014  . Keflex [cephalexin] Rash 10/24/2018  . Penicillins Rash   . Rocephin [ceftriaxone] Rash 10/24/2018  . Tomato Hives 05/05/2014    Family History  Problem Relation Age of Onset  . Hypertension Father   . Diabetes Father   . Lung cancer Father   . Heart attack Father        MI at age 58  . Hypertension Mother   . Hyperthyroidism Mother   . Heart disease Mother   . Heart attack Mother         MI at age 37  . Asthma Brother   . Hypertension Brother        younger  . Heart disease Brother        older  . Dementia Paternal Aunt        Unspecified type; symptoms emerged with advanced age  . Colon cancer Neg Hx   . Esophageal cancer Neg Hx   . Stomach cancer Neg Hx   . Kidney disease Neg Hx   . Liver disease Neg Hx     Social History   Socioeconomic History  . Marital status: Single    Spouse name: Not on file  . Number of children: 0  . Years of education: 11  . Highest education level: Some college, no degree  Occupational History  . Occupation: works in Insurance claims handler  . Occupation: DESIGN COMPUTER CHIP    Employer: [employer]  Tobacco Use  . Smoking status: Former Smoker    Packs/day: 0.50    Years: 25.00    Pack years: 12.50    Types: Cigarettes    Start date: 12/24/1970    Quit date: 05/27/1995    Years since quitting: 24.3  . Smokeless tobacco: Never Used  . Tobacco comment: quit smoking 23 years ago  Substance and Sexual Activity  . Alcohol use: Yes    Alcohol/week: 0.0 standard drinks    Comment: 1/2 glass per month  . Drug use: No  . Sexual activity:  Yes    Partners: Male    Birth control/protection: Post-menopausal  Other Topics Concern  . Not on file  Social History Narrative   Occupation: works in Insurance claims handler - Field seismologist   Single       Former Smoker - quit tobacco 12 years ago.  She was light smoker for 10 years.             College edu      Right handed      Lives alone in one story home. Has steps to enter home.      Social Determinants of Health   Financial Resource Strain:   . Difficulty of Paying Living Expenses:   Food Insecurity:   . Worried About Charity fundraiser in the Last Year:   . Arboriculturist in the Last Year:   Transportation Needs:   . Film/video editor (Medical):   [name] Lack of Transportation (Non-Medical):   Physical Activity:   . Days of Exercise per Week:   . Minutes of  Exercise per Session:   Stress:   . Feeling of Stress :   Social Connections:   . Frequency of Communication with Friends and Family:   . Frequency of Social Gatherings with Friends and Family:   . Attends Religious Services:   . Active Member of Clubs or Organizations:   . Attends Archivist Meetings:   [name] Marital Status:   Intimate Partner Violence:   . Fear of Current or Ex-Partner:   . Emotionally Abused:   [name] Physically Abused:   . Sexually Abused:     Review of Systems:    Constitutional: No weight loss, fever or chills Skin: No rash  Cardiovascular: No chest pain Respiratory: No SOB  Gastrointestinal: See HPI and otherwise negative Genitourinary: No dysuria  Neurological: No headache, dizziness or syncope Musculoskeletal: No new muscle or joint pain Hematologic: No bleeding Psychiatric: No history of depression or anxiety    Physical Exam:  Vital signs: BP (!) 92/50 (BP Location: Right Arm, Patient Position: Sitting, Cuff Size: Large)   Pulse 70   Temp 97.7 F (36.5 C)   Ht 5\' 4"  (1.626 m)   LMP 06/11/2012   SpO2 97%   BMI 42.57 kg/m   Constitutional:   Pleasant obese Caucasian female appears to be in NAD, Well developed, Well nourished, alert and cooperative Head:  Normocephalic and atraumatic. Eyes:   PEERL, EOMI. No icterus. Conjunctiva pink. Ears:  Normal auditory acuity. Neck:  Supple Throat: Oral cavity and pharynx without inflammation, swelling or lesion.  Respiratory: Respirations even and unlabored. Lungs clear to auscultation bilaterally.   No wheezes, crackles, or rhonchi.  Cardiovascular: Normal S1, S2. No MRG. Regular rate and rhythm. No peripheral edema, cyanosis or pallor.  Gastrointestinal:  Soft, nondistended, nontender. No rebound or guarding. Normal bowel sounds. No appreciable masses or hepatomegaly. Rectal:  Not performed.  Msk:  Symmetrical without gross deformities. Without edema, no deformity or joint abnormality. Ambulating in  a wheelchair Neurologic:  Alert and  oriented x4;  grossly normal neurologically.  Skin:   Dry and intact without significant lesions or rashes. Psychiatric: Demonstrates good judgement and reason without abnormal affect or behaviors.  RELEVANT LABS AND IMAGING: CBC    Component Value Date/Time   WBC 6.7 08/29/2019 0852   WBC 6.8 08/23/2019 1454   RBC 3.29 (L) 08/29/2019 0853   RBC 3.31 (L) 08/29/2019 0852   HGB 8.9 (L) 08/29/2019 [id number]  HGB 14.0 03/30/2017 1455   HGB 11.3 (L) 01/03/2008 1050   HCT 30.0 (L) 08/29/2019 0852   HCT 43.8 03/30/2017 1455   HCT 35.2 01/03/2008 1050   PLT 175 08/29/2019 0852   PLT 157 03/30/2017 1455   PLT Clumped Platelets--Appears Adequate 01/03/2008 1050   MCV 90.6 08/29/2019 0852   MCV 86 03/30/2017 1455   MCV 80.1 (L) 01/03/2008 1050   MCH 26.9 08/29/2019 0852   MCHC 29.7 (L) 08/29/2019 0852   RDW 16.8 (H) 08/29/2019 0852   RDW 14.1 03/30/2017 1455   RDW 18.2 (H) 01/03/2008 1050   LYMPHSABS 0.6 (L) 08/29/2019 0852   LYMPHSABS 1.7 03/30/2017 1455   LYMPHSABS 1.5 01/03/2008 1050   MONOABS 0.3 08/29/2019 0852   MONOABS 0.6 01/03/2008 1050   EOSABS 0.1 08/29/2019 0852   EOSABS 0.2 03/30/2017 1455   BASOSABS 0.0 08/29/2019 0852   BASOSABS 0.1 03/30/2017 1455   BASOSABS 0.0 01/03/2008 1050    CMP     Component Value Date/Time   NA 138 08/29/2019 0852   NA 144 03/30/2017 1455   NA 139 07/30/2015 1302   K 3.5 08/29/2019 0852   K 3.7 03/30/2017 1455   K 3.5 07/30/2015 1302   CL 99 08/29/2019 0852   CL 99 03/30/2017 1455   CO2 28 08/29/2019 0852   CO2 30 03/30/2017 1455   CO2 25 07/30/2015 1302   GLUCOSE 90 08/29/2019 0852   GLUCOSE 87 03/30/2017 1455   BUN 48 (H) 08/29/2019 0852   BUN 14 03/30/2017 1455   BUN 16.8 07/30/2015 1302   CREATININE 1.41 (H) 08/29/2019 0852   CREATININE 1.17 (H) 04/20/2019 1538   CREATININE 0.8 07/30/2015 1302   CALCIUM 10.4 (H) 08/29/2019 0852   CALCIUM 8.7 03/30/2017 1455   CALCIUM 8.8 07/30/2015  1302   PROT 7.2 08/29/2019 0852   PROT 7.5 03/30/2017 1455   PROT 7.7 07/30/2015 1302   ALBUMIN 3.9 08/29/2019 0852   ALBUMIN 3.4 03/30/2017 1455   ALBUMIN 3.9 07/28/2016 1440   ALBUMIN 3.5 07/30/2015 1302   AST 18 08/29/2019 0852   AST 36 (H) 07/30/2015 1302   ALT 27 08/29/2019 0852   ALT 28 03/30/2017 1455   ALT 68 (H) 07/30/2015 1302   ALKPHOS 135 (H) 08/29/2019 0852   ALKPHOS 107 (H) 03/30/2017 1455   ALKPHOS 99 07/30/2015 1302   BILITOT 0.4 08/29/2019 0852   BILITOT 0.41 07/30/2015 1302   GFRNONAA 40 (L) 08/29/2019 0852   GFRNONAA 83 11/23/2013 0859   GFRAA 46 (L) 08/29/2019 0852   GFRAA >89 11/23/2013 0859    Assessment: 1.  Chronic iron deficiency anemia: Last work-up with EGD and colonoscopy in 2015, EGD with linear erosions in the esophagus and colon was incomplete due to poor prep (patient never went through with virtual colonoscopy) (of note patient tells me that she had a small bowel pill capsule endoscopy at some point but I do not see results in chart), has required Feraheme infusions every 8 months for her iron deficiency anemia with chronically low hemoglobin; likely still GI related 2.  Chronic anticoagulation: With Eliquis for A. fib 3.  GERD: Uncontrolled on Omeprazole 40 daily  Plan: 1.  Scheduled the patient for repeat diagnostic EGD and colonoscopy in the hospital due to the fact that she cannot transfer herself as she is wheelchair-bound.  These were scheduled with Dr. Henrene Pastor.  Did discuss risks, benefits, limitations and alternatives and the patient agrees to proceed. 2.  Explained to the  patient that she will complete a 2-day bowel prep for her upcoming colonoscopy so that hopefully this will be complete.  Also discussed the need for repeat EGD given linear erosions at time of last endoscopy and continued anemia regardless of supplementation. 3.  Increased Omeprazole to 40 mg twice daily, 30-60 minutes before breakfast and dinner #60 with 5 refills. 4.   Patient was advised to hold her Eliquis for 2 days prior to time of procedures.  We will communicate with her prescribing physician to ensure this is acceptable for her. 5.  Patient to follow in clinic per recommendations from Dr. Henrene Pastor after time of procedures.  Ellouise Newer, PA-C [facility] 09/08/2019, 3:42 PM  Cc: Debbrah Alar, NP

## 2019-09-08 NOTE — Patient Instructions (Signed)
If you are age 63 or older, your body mass index should be between 23-30. Your Body mass index is 42.57 kg/m. If this is out of the aforementioned range listed, please consider follow up with your Primary Care Provider.  If you are age 40 or younger, your body mass index should be between 19-25. Your Body mass index is 42.57 kg/m. If this is out of the aformentioned range listed, please consider follow up with your Primary Care Provider.  We have sent the following medications to your pharmacy for you to pick up at your convenience:  suprep Omeprazole  Due to recent changes in healthcare laws, you may see the results of your imaging and laboratory studies on MyChart before your provider has had a chance to review them.  We understand that in some cases there may be results that are confusing or concerning to you. Not all laboratory results come back in the same time frame and the provider may be waiting for multiple results in order to interpret others.  Please give [place] 48 hours in order for your provider to thoroughly review all the results before contacting the office for clarification of your results.

## 2019-09-09 ENCOUNTER — Inpatient Hospital Stay: Payer: 59

## 2019-09-09 ENCOUNTER — Other Ambulatory Visit: Payer: Self-pay

## 2019-09-09 VITALS — BP 115/38 | HR 62 | Temp 96.9°F | Resp 18

## 2019-09-09 DIAGNOSIS — D509 Iron deficiency anemia, unspecified: Secondary | ICD-10-CM | POA: Diagnosis not present

## 2019-09-09 DIAGNOSIS — D508 Other iron deficiency anemias: Secondary | ICD-10-CM

## 2019-09-09 MED ORDER — SODIUM CHLORIDE 0.9 % IV SOLN
Freq: Once | INTRAVENOUS | Status: AC
  Filled 2019-09-09: qty 250

## 2019-09-09 MED ORDER — SODIUM CHLORIDE 0.9 % IV SOLN
200.0000 mg | Freq: Once | INTRAVENOUS | Status: AC
  Administered 2019-09-09: 14:00:00 200 mg via INTRAVENOUS
  Filled 2019-09-09: qty 200

## 2019-09-09 NOTE — Progress Notes (Signed)
Assessment and plans noted ?

## 2019-09-09 NOTE — Progress Notes (Signed)
Discharged patient for prior RN.

## 2019-09-12 ENCOUNTER — Other Ambulatory Visit: Payer: Self-pay

## 2019-09-12 ENCOUNTER — Ambulatory Visit: Payer: 59 | Admitting: Podiatry

## 2019-09-12 VITALS — Temp 97.6°F

## 2019-09-12 DIAGNOSIS — Z7901 Long term (current) use of anticoagulants: Secondary | ICD-10-CM | POA: Diagnosis not present

## 2019-09-12 DIAGNOSIS — B351 Tinea unguium: Secondary | ICD-10-CM | POA: Diagnosis not present

## 2019-09-12 DIAGNOSIS — M79674 Pain in right toe(s): Secondary | ICD-10-CM

## 2019-09-12 DIAGNOSIS — M79675 Pain in left toe(s): Secondary | ICD-10-CM | POA: Diagnosis not present

## 2019-09-15 ENCOUNTER — Inpatient Hospital Stay: Payer: 59

## 2019-09-15 ENCOUNTER — Other Ambulatory Visit: Payer: Self-pay

## 2019-09-15 ENCOUNTER — Encounter: Payer: Self-pay | Admitting: Family

## 2019-09-15 VITALS — BP 92/47 | HR 52 | Temp 96.1°F | Resp 18

## 2019-09-15 DIAGNOSIS — D509 Iron deficiency anemia, unspecified: Secondary | ICD-10-CM | POA: Diagnosis not present

## 2019-09-15 DIAGNOSIS — D508 Other iron deficiency anemias: Secondary | ICD-10-CM

## 2019-09-15 MED ORDER — SODIUM CHLORIDE 0.9 % IV SOLN
200.0000 mg | Freq: Once | INTRAVENOUS | Status: AC
  Administered 2019-09-15: 200 mg via INTRAVENOUS
  Filled 2019-09-15: qty 200

## 2019-09-15 MED ORDER — SODIUM CHLORIDE 0.9 % IV SOLN
Freq: Once | INTRAVENOUS | Status: AC
  Filled 2019-09-15: qty 250

## 2019-09-16 ENCOUNTER — Ambulatory Visit: Payer: 59

## 2019-09-16 MED ORDER — VITAMIN D (ERGOCALCIFEROL) 1.25 MG (50000 UNIT) PO CAPS: ORAL_CAPSULE | ORAL | 1 refills | Status: DC

## 2019-09-17 ENCOUNTER — Encounter: Payer: Self-pay | Admitting: Family

## 2019-09-19 ENCOUNTER — Other Ambulatory Visit: Payer: Self-pay | Admitting: Pharmacist Clinician (PhC)/ Clinical Pharmacy Specialist

## 2019-09-19 MED ORDER — APIXABAN 5 MG PO TABS: 5.0000 mg | ORAL_TABLET | Freq: Two times a day (BID) | ORAL | 1 refills | Status: DC

## 2019-09-19 NOTE — Telephone Encounter (Signed)
I feel like she should be seen today, in person would be best. If she can't come during one of my openings, maybe someone else can see her.  I want to make sure that we are not missing something like a UTI causing her bp to drop.  Can you please contact the pt?

## 2019-09-19 NOTE — Telephone Encounter (Signed)
LM requesting call back to schedule in person visit for low BP.

## 2019-09-23 ENCOUNTER — Ambulatory Visit: Payer: 59 | Admitting: Family

## 2019-09-23 ENCOUNTER — Other Ambulatory Visit: Payer: Self-pay

## 2019-09-23 VITALS — BP 95/55 | HR 68 | Temp 97.2°F | Resp 16 | Ht 64.0 in | Wt 238.0 lb

## 2019-09-23 DIAGNOSIS — F329 Major depressive disorder, single episode, unspecified: Secondary | ICD-10-CM

## 2019-09-23 DIAGNOSIS — E039 Hypothyroidism, unspecified: Secondary | ICD-10-CM

## 2019-09-23 DIAGNOSIS — E559 Vitamin D deficiency, unspecified: Secondary | ICD-10-CM

## 2019-09-23 DIAGNOSIS — L8990 Pressure ulcer of unspecified site, unspecified stage: Secondary | ICD-10-CM

## 2019-09-23 DIAGNOSIS — F32A Depression, unspecified: Secondary | ICD-10-CM

## 2019-09-23 DIAGNOSIS — I10 Essential (primary) hypertension: Secondary | ICD-10-CM

## 2019-09-23 DIAGNOSIS — R296 Repeated falls: Secondary | ICD-10-CM

## 2019-09-23 MED ORDER — DULOXETINE HCL 30 MG PO CPEP: ORAL_CAPSULE | ORAL | 1 refills | Status: DC

## 2019-09-23 NOTE — Progress Notes (Signed)
Subjective:    Patient ID: [name], female    DOB: 10/27/1955, 64 y.o.   MRN: [id number]  HPI  Patient is a 64 yr old female who presents today for follow up. Since her last visit she has lost her job and today is her last day of health insurance from her employer.   HTN- patient is maintained on diltiazem 120mg   and lasix 40mg  twice daily. Reports that swelling has been stable.   Wt Readings from Last 3 Encounters:  09/23/19 238 lb (108 kg)  08/29/19 248 lb (112.5 kg)  08/23/19 248 lb (112.5 kg)   BP Readings from Last 3 Encounters:  09/23/19 (!) 95/55  09/15/19 (!) 92/47  09/09/19 (!) 115/38   Hypothyroid- This is followed by endocrinology- Dr. Toy Care   Recurrent Falls- no falls since her last visit. She has been working with outpatient PT.  Unfortunately, due to her loss of insurance, she will not be able to afford to continue.   Pressure ulcer- reports that her sister-in-law is changing her dressing and she declines examination of ulcer today.   Depression- reports that she did not start cymbalta because :it never came from the mail order". States that she did not know it was sent to her local pharmacy.     Review of Systems See HPI  Past Medical History:  Diagnosis Date  . Abnormal Pap smear    years ago/no biopsy  . Allergic rhinitis   . Aortic atherosclerosis   . Arthritis    back- lower  . Asthma   . Atrial fibrillation 12/2010   OFF XARELTO LAST MONTH DUE TO BLEEDING IN STOOL  . Bursitis of hip left  . Chronic anticoagulation 12/16/2018   CHADS VASC=2 for sex and H/O HTN- she is on Eliquis  . Chronic diastolic (congestive) heart failure    pt unaware of this  . COPD (chronic obstructive pulmonary disease) with chronic bronchitis   . Decreased pulses in feet   . Dysrhythmia    afib, followed by Dr. Stanford Breed   . Edema of both legs   . Endometrial adenocarcinoma 02/21/2013   S/p D and C, has mirena, being followed by GYN   . Esophagitis 02/02/2014    Distal, linear erosions, noted on endoscopy  . Essential hypertension 01/05/2007   Echo Nov June 2020- EF 50-55%, mild LVH, normal LA   . Fatty liver 01/07/2012  . Fibroid 1974   fibroid cyst on left fallopian tube  . Fibromyalgia   . Foot ulcer    AREA HEALED RIGHT FOOT  . Generalized anxiety disorder   . GERD (gastroesophageal reflux disease)   . Headache    occasional sinus headache   . Hepatomegaly   . History of blood transfusion JULY 2015  . History of cardiomegaly 12/06/2010   Noted on CT  . History of E. coli septicemia   . History of papillary thyroid carcinoma 04/07/2011  . Hypocalcemia 12/10/2016  . Hypokalemia 12/10/2016  . Hypoparathyroidism 01/07/2011  . Hypothyroidism   . Iron deficiency anemia secondary to malabsorption  05/10/2007   Qualifier: Diagnosis of  By: Wynona Luna    . Kidney stone Sep 28, 2015   passed on their own  . Leukocytosis 03/24/2009   cta cheQualifier: Diagnosis of  By: Wynona Luna    . Major depressive disorder   . Mild neurocognitive disorder 06/28/2019  . Morbid obesity   . MRSA infection   . Nephrolithiasis 2/16, 9/16  SEES DR Risa Grill  . Osteomyelitis 06/14/2014  . PAF (paroxysmal atrial fibrillation) 02/05/2011   Documented 2012- NSR since.  On chronic anticoagulation.  . Pernicious anemia 02/20/2014   followed by Debbrah Alar  . Pneumonia   . PONV (postoperative nausea and vomiting)   . Pyelonephritis 11/24/2018  . Rash    thighs and back  . Seizures    infancy secondary to fever  . Staph aureus infection   . Thoracic spondylosis 12/06/2010   Noted on CT  . Uterine cancer 2014   Mirena IUD  . UTI (urinary tract infection) 12/13/2018  . Vitamin B 12 deficiency   . Vitamin D deficiency 05/10/2007   Qualifier: Diagnosis of  By: Wynona Luna      Social History   Socioeconomic History  . Marital status: Single    Spouse name: Not on file  . Number of children: 0  . Years of education: 30  . Highest  education level: Some college, no degree  Occupational History  . Occupation: works in Insurance claims handler  . Occupation: DESIGN COMPUTER CHIP    Employer: [employer]  Tobacco Use  . Smoking status: Former Smoker    Packs/day: 0.50    Years: 25.00    Pack years: 12.50    Types: Cigarettes    Start date: 12/24/1970    Quit date: 05/27/1995    Years since quitting: 24.3  . Smokeless tobacco: Never Used  Substance and Sexual Activity  . Alcohol use: Yes    Alcohol/week: 0.0 standard drinks    Comment: <12 oz per month  . Drug use: No  . Sexual activity: Yes    Partners: Male    Birth control/protection: Post-menopausal  Other Topics Concern  . Not on file  Social History Narrative   Occupation: works in Insurance claims handler - Field seismologist   Single       Former Smoker - quit tobacco 12 years ago.  She was light smoker for 10 years.             College edu      Right handed      Lives alone in one story home. Has steps to enter home.      Social Determinants of Health   Financial Resource Strain:   . Difficulty of Paying Living Expenses:   Food Insecurity:   . Worried About Charity fundraiser in the Last Year:   . Arboriculturist in the Last Year:   Transportation Needs:   . Film/video editor (Medical):   [name] Lack of Transportation (Non-Medical):   Physical Activity:   . Days of Exercise per Week:   . Minutes of Exercise per Session:   Stress:   . Feeling of Stress :   Social Connections:   . Frequency of Communication with Friends and Family:   . Frequency of Social Gatherings with Friends and Family:   . Attends Religious Services:   . Active Member of Clubs or Organizations:   . Attends Archivist Meetings:   [name] Marital Status:   Intimate Partner Violence:   . Fear of Current or Ex-Partner:   . Emotionally Abused:   [name] Physically Abused:   . Sexually Abused:     Past Surgical History:  Procedure Laterality Date  . AMPUTATION Right  05/18/2014   Procedure: RIGHT FIFTH RAY AMPUTATION FOOT;  Surgeon: Wylene Simmer, MD;  Location: [facility];  Service: Orthopedics;  Laterality: Right;  .  APPENDECTOMY    . BUNIONECTOMY     bilateral  . COLONOSCOPY    . COLONOSCOPY WITH PROPOFOL N/A 02/02/2014   Procedure: COLONOSCOPY WITH PROPOFOL;  Surgeon: Irene Shipper, MD;  Location: [facility];  Service: Endoscopy;  Laterality: N/A;  . cyst on ovary removed     . CYSTOSCOPY W/ RETROGRADES  03/03/2012   Procedure: CYSTOSCOPY WITH RETROGRADE PYELOGRAM;  Surgeon: Bernestine Amass, MD;  Location: [facility];  Service: Urology;  Laterality: Bilateral;  . CYSTOSCOPY W/ URETERAL STENT PLACEMENT Right 11/25/2017   Procedure: CYSTOSCOPY WITH RETROGRADE RIGHT URETERAL STENT PLACEMENT;  Surgeon: Franchot Gallo, MD;  Location: [facility];  Service: Urology;  Laterality: Right;  . CYSTOSCOPY W/ URETERAL STENT PLACEMENT Right 01/15/2018   Procedure: RIGHT STENT EXCHANGE;  Surgeon: Ardis Hughs, MD;  Location: [facility];  Service: Urology;  Laterality: Right;  . CYSTOSCOPY WITH RETROGRADE PYELOGRAM, URETEROSCOPY AND STENT PLACEMENT Left 08/25/2012   Procedure: CYSTOSCOPY WITH RETROGRADE PYELOGRAM, URETEROSCOPY;  Surgeon: Bernestine Amass, MD;  Location: [facility];  Service: Urology;  Laterality: Left;  . CYSTOSCOPY WITH STENT PLACEMENT Left 01/15/2018   Procedure: LEFT STENT PLACEMENT;  Surgeon: Ardis Hughs, MD;  Location: [facility];  Service: Urology;  Laterality: Left;  . CYSTOSCOPY WITH STENT PLACEMENT Right 01/22/2018   Procedure: RIGHT STENT REMOVAL;  Surgeon: Ardis Hughs, MD;  Location: [facility];  Service: Urology;  Laterality: Right;  . CYSTOSCOPY/URETEROSCOPY/HOLMIUM LASER/STENT PLACEMENT Right 12/23/2017   Procedure: RIGHT URETEROSCOPY STONE REMOVAL, HOLMIUM LASER, RIGHT /STENT EXCHANGE;  Surgeon: Ardis Hughs, MD;  Location: [facility];  Service: Urology;  Laterality: Right;  . CYSTOSCOPY/URETEROSCOPY/HOLMIUM LASER/STENT PLACEMENT Left 01/22/2018    Procedure: LEFT URETEROSCOPY STONE REMOVAL HOLMIUM LASER LEFT STENT Freddi Starr;  Surgeon: Ardis Hughs, MD;  Location: [facility];  Service: Urology;  Laterality: Left;  . DILATION AND CURETTAGE OF UTERUS N/A 02/21/2013   Procedure: DILATATION AND CURETTAGE;  Surgeon: Lyman Speller, MD;  Location: [facility];  Service: Gynecology;  Laterality: N/A;  . DILATION AND CURETTAGE OF UTERUS N/A 04/09/2015   Procedure: [place] IUD removal;  Surgeon: Megan Salon, MD;  Location: [facility];  Service: Gynecology;  Laterality: N/A;  Patient weight 307lbs  . ESOPHAGOGASTRODUODENOSCOPY (EGD) WITH PROPOFOL N/A 02/02/2014   Procedure: ESOPHAGOGASTRODUODENOSCOPY (EGD) WITH PROPOFOL;  Surgeon: Irene Shipper, MD;  Location: [facility];  Service: Endoscopy;  Laterality: N/A;  . GASTRIC BYPASS  1974  . HOLMIUM LASER APPLICATION Left 0000000   Procedure: HOLMIUM LASER APPLICATION;  Surgeon: Bernestine Amass, MD;  Location: [facility];  Service: Urology;  Laterality: Left;  . LITHOTRIPSY  03/2012  . LITHOTRIPSY  2/16  . THYROIDECTOMY  05/15/2010  . TONSILLECTOMY AND ADENOIDECTOMY    . UPPER GASTROINTESTINAL ENDOSCOPY    . URETEROSCOPY  03/03/2012   Procedure: URETEROSCOPY;  Surgeon: Bernestine Amass, MD;  Location: [facility];  Service: Urology;  Laterality: Left;  . URETEROSCOPY WITH HOLMIUM LASER LITHOTRIPSY Bilateral 01/15/2018   Procedure: CYSTOSCOPY/BILATERAL URETEROSCOPY WITH HOLMIUM LASER LITHOTRIPSY STONE REMOVAL;  Surgeon: Ardis Hughs, MD;  Location: [facility];  Service: Urology;  Laterality: Bilateral;    Family History  Problem Relation Age of Onset  . Hypertension Father   . Diabetes Father   . Lung cancer Father   . Heart attack Father        MI at age 23  . Hypertension Mother   . Hyperthyroidism Mother   . Heart disease Mother   .  Heart attack Mother        MI at age 80  . Asthma Brother   . Hypertension Brother        younger  . Heart disease Brother         older  . Dementia Paternal Aunt        Unspecified type; symptoms emerged with advanced age  . Colon cancer Neg Hx   . Esophageal cancer Neg Hx   . Stomach cancer Neg Hx   . Kidney disease Neg Hx   . Liver disease Neg Hx   . Pancreatic cancer Neg Hx     Allergies  Allergen Reactions  . Other Anaphylaxis    Reaction to tree nuts  . Amoxicillin-Pot Clavulanate Other (See Comments)    headache  . Ciprofloxacin Nausea Only and Rash  . Food Hives and Other (See Comments)    Potato  . Keflex [Cephalexin] Rash  . Penicillins Rash    Headache. And hives - Dose not tolerate Cephalosporin either   Has patient had a PCN reaction causing immediate rash, facial/tongue/throat swelling, SOB or lightheadedness with hypotension: No Has patient had a PCN reaction causing severe rash involving mucus membranes or skin necrosis: No Has patient had a PCN reaction that required hospitalization: No Has patient had a PCN reaction occurring within the last 10 years: Yes (reaction was May 2020) If all of the above answers are "NO", then may proce  . Rocephin [Ceftriaxone] Rash  . Tomato Hives    Current Outpatient Medications on File Prior to Visit  Medication Sig Dispense Refill  . acetaminophen (TYLENOL) 325 MG tablet Take 2 tablets (650 mg total) by mouth every 6 (six) hours as needed for mild pain or moderate pain. 60 tablet 0  . albuterol (PROVENTIL HFA;VENTOLIN HFA) 108 (90 Base) MCG/ACT inhaler Inhale 2 puffs into the lungs every 6 (six) hours as needed for wheezing or shortness of breath. 1 Inhaler 5  . albuterol (PROVENTIL) (2.5 MG/3ML) 0.083% nebulizer solution Take 3 mLs (2.5 mg total) by nebulization every 6 (six) hours as needed for wheezing or shortness of breath. 150 mL 1  . apixaban (ELIQUIS) 5 MG TABS tablet Take 1 tablet (5 mg total) by mouth 2 (two) times daily. 180 tablet 1  . calcitRIOL (ROCALTROL) 0.5 MCG capsule Take 2 mcg by mouth daily.     . cyanocobalamin (,VITAMIN B-12,)  1000 MCG/ML injection ADMINISTER 1ML (1,000 MCG) UNDER THE SKIN EVERY 30 DAYS 3 mL 4  . desoximetasone (TOPICORT) 0.05 % cream Apply topically 2 (two) times daily as needed. 60 g 0  . diclofenac sodium (VOLTAREN) 1 % GEL Apply 2 g topically 4 (four) times daily. 100 g 3  . EPINEPHrine (EPIPEN 2-PAK) 0.3 mg/0.3 mL IJ SOAJ injection Inject 0.3 mg into the muscle once as needed for anaphylaxis (severe allergic reaction).     . furosemide (LASIX) 40 MG tablet Take 1 tablet (40 mg total) by mouth 2 (two) times daily. 270 tablet 3  . hydrocortisone cream 1 % Apply topically 4 (four) times daily as needed for itching. 30 g 0  . levocetirizine (XYZAL) 5 MG tablet TAKE 1 TABLET BY MOUTH  EVERY EVENING 90 tablet 3  . levothyroxine (SYNTHROID, LEVOTHROID) 100 MCG tablet Take 400 mcg by mouth daily before breakfast.     . Melatonin 5 MG TABS Take 5 mg by mouth at bedtime as needed (for sleep).     . MYRBETRIQ 50 MG TB24 tablet Take 50  mg by mouth daily.    [name] nystatin (MYCOSTATIN/NYSTOP) powder Apply topically 2 (two) times daily. 45 g 1  . nystatin cream (MYCOSTATIN) Apply 1 application topically 2 (two) times daily. 30 g 2  . omeprazole (PRILOSEC) 40 MG capsule Take 1 capsule (40 mg total) by mouth in the morning and at bedtime. 60 capsule 5  . ondansetron (ZOFRAN) 4 MG tablet Take 1 tablet (4 mg total) by mouth every 8 (eight) hours as needed for nausea or vomiting. 30 tablet 0  . Simethicone (GAS-X PO) Take 1 tablet by mouth as needed.    [name] spironolactone (ALDACTONE) 25 MG tablet Take 1 tablet (25 mg total) by mouth daily. 90 tablet 2  . SYMBICORT 160-4.5 MCG/ACT inhaler INHALE 2 PUFFS BY MOUTH TWO TIMES DAILY 30.6 g 1  . Syringe/Needle, Disp, (SYRINGE 3CC/20GX1") 20G X 1" 3 ML MISC Use monthly as directed 50 each 0  . traMADol (ULTRAM) 50 MG tablet TAKE 1 TABLET(50 MG) BY MOUTH EVERY 12 HOURS AS NEEDED 30 tablet 0  . trimethoprim (TRIMPEX) 100 MG tablet Take 100 mg by mouth daily.    . Vitamin D,  Ergocalciferol, (DRISDOL) 1.25 MG (50000 UNIT) CAPS capsule TAKE 1 CAPSULE BY MOUTH  EVERY MONDAY, WEDNESDAY,  AND FRIDAY. 39 capsule 1  . zafirlukast (ACCOLATE) 20 MG tablet Take 1 tablet (20 mg total) by mouth 2 (two) times daily before a meal. 120 tablet 0  . diltiazem (CARDIZEM CD) 120 MG 24 hr capsule Take 1 capsule (120 mg total) by mouth daily. 90 capsule 1   No current facility-administered medications on file prior to visit.    BP (!) 95/55 (BP Location: Right Arm, Patient Position: Sitting)   Pulse 68   Temp (!) 97.2 F (36.2 C) (Temporal)   Resp 16   Ht 5\' 4"  (1.626 m)   Wt 238 lb (108 kg)   LMP 06/11/2012   SpO2 98%   BMI 40.85 kg/m       Objective:   Physical Exam Constitutional:      Appearance: She is well-developed.  Neck:     Thyroid: No thyromegaly.  Cardiovascular:     Rate and Rhythm: Normal rate and regular rhythm.     Heart sounds: Normal heart sounds. No murmur.  Pulmonary:     Effort: Pulmonary effort is normal. No respiratory distress.     Breath sounds: Normal breath sounds. No wheezing.  Musculoskeletal:     Cervical back: Neck supple.     Right lower leg: 2+ Edema present.     Left lower leg: 2+ Edema present.  Skin:    General: Skin is warm and dry.  Neurological:     Mental Status: She is alert and oriented to person, place, and time.  Psychiatric:        Behavior: Behavior normal.        Thought Content: Thought content normal.        Judgment: Judgment normal.           Assessment & Plan:  HTN- bp is low. I suspect she is a bit dry. She is advised to decrease lasix to 20mg  bid for 3 days, then call me on Tuesday with her blood pressure reading and weight.  Further instructions pending review of her readings.  Pressure ulcer- declines examination.  Depression- encouraged pt to begin cymbalta.  It was resent to her local pharmacy.   Hypothyroid- management per endo.   Recurrent falls- she states that she  plans to have her  family member help her with completing exercises at home. Thankfully she has not had any falls since her last visit.   Vit D deficiency- will check follow up vit D level.   She has not looked into private health insurance. I encouraged her to look into Obamacare options since she has multiple health issues and really needs health insurance.  This visit occurred during the SARS-CoV-2 public health emergency.  Safety protocols were in place, including screening questions prior to the visit, additional usage of staff PPE, and extensive cleaning of exam room while observing appropriate contact time as indicated for disinfecting solutions.

## 2019-09-26 ENCOUNTER — Encounter: Payer: Self-pay | Admitting: Family

## 2019-09-26 LAB — VITAMIN D 1,25 DIHYDROXY
Vitamin D 1, 25 (OH)2 Total: 67 pg/mL (ref 18–72)
Vitamin D2 1, 25 (OH)2: 9 pg/mL
Vitamin D3 1, 25 (OH)2: 58 pg/mL

## 2019-09-26 NOTE — Progress Notes (Signed)
Subjective: 64 year old female presents the office today for concerns of thick, elongated toenails that she cannot trim her self as well as for calluses.  She still interested in having surgery but she wants to try to do reconstructions on both big toes and not amputation of the left big toe.  She denies any new issues. Denies any systemic complaints such as fevers, chills, nausea, vomiting. No acute changes since last appointment, and no other complaints at this time.   Objective: AAO x3, NAD DP/PT pulses palpable bilaterally, CRT less than 3 seconds Hallux varus is present bilaterally left side worse than the right.   Nails appear to be dystrophic, discolored with yellow-brown discoloration x9.  No redness or drainage or any signs of infection.  There is minimal hyperkeratotic lesion submetatarsal area without any underlying ulceration drainage or signs of infection. No open lesions or pre-ulcerative lesions.  No pain with calf compression, swelling, warmth, erythema  Assessment: Hallux varus with symptomatic onychomycosis, hyperkeratotic lesions  Plan: -All treatment options discussed with the patient including all alternatives, risks, complications.  -Regards to hallux varus she wants to proceed with reconstruction, first to be arthrodesis on bilateral first MPJs.  She will consider when she wants to do this. -Debrided the nails x 9 without any complications or bleeding also debrided very minimal hyperkeratotic lesions without any complications or bleeding.  Currently no signs of infection or ulcerations but continue to monitor closely. -Patient encouraged to call the office with any questions, concerns, change in symptoms.   Trula Slade DPM

## 2019-09-27 ENCOUNTER — Encounter: Payer: Self-pay | Admitting: Family

## 2019-09-28 ENCOUNTER — Encounter: Payer: Self-pay | Admitting: Family

## 2019-09-28 NOTE — Telephone Encounter (Signed)
Wt Readings from Last 3 Encounters:  09/23/19 238 lb (108 kg)  08/29/19 248 lb (112.5 kg)  08/23/19 248 lb (112.5 kg)   BP today 100/53  BP Readings from Last 3 Encounters:  09/23/19 (!) 95/55  09/15/19 (!) 92/47  09/09/19 (!) 115/38   Spoke to patient. She reports that her dizziness is better.  Advised pt to change furosemide to 40mg  in AM and 20mg  in PM. Continue to to monitor weights and call if she gains 5 or more pounds. She has appointment with Dr. Stanford Breed on 5/19 and I have advised her to keep this appointment.   (Dr. Sandria Bales- she looked a bit dry yesterday, low bp, dizzy). Tks

## 2019-10-02 ENCOUNTER — Encounter: Payer: Self-pay | Admitting: Family

## 2019-10-04 NOTE — Telephone Encounter (Signed)
Patient reports she received message with instructions.

## 2019-10-06 ENCOUNTER — Other Ambulatory Visit (HOSPITAL_COMMUNITY)
Admission: RE | Admit: 2019-10-06 | Discharge: 2019-10-06 | Disposition: A | Discharge status: Home / Self Care | Payer: 59 | Source: Ambulatory Visit | Attending: Internal Medicine | Admitting: Internal Medicine

## 2019-10-06 DIAGNOSIS — Z20822 Contact with and (suspected) exposure to covid-19: Secondary | ICD-10-CM | POA: Diagnosis not present

## 2019-10-06 DIAGNOSIS — Z01812 Encounter for preprocedural laboratory examination: Secondary | ICD-10-CM | POA: Insufficient documentation

## 2019-10-06 LAB — SARS CORONAVIRUS 2 (TAT 6-24 HRS): SARS Coronavirus 2: NEGATIVE

## 2019-10-06 NOTE — Progress Notes (Deleted)
HPI: FU atrial fibrillation. Myoview July 2012showed EF64% mild ischemia in the inferior wall. We elected to treat medically. Patient was also having palpitations and near syncopal episodes. A CardioNet revealed sinus rhythm with PACs, brief runs of PAT and brief runs of PAF. MRA in September of 2014 showed no aneurysm. Xarelto DCed previously due to GI bleed; WU showed esophagitis; anticoagulation resumed.ABIs November 2019-waveform analysis showed no evidence of significant right or left lower extremity arterial disease.  Echocardiogram June 2020 showed normal LV function.  Since last seen,   Current Outpatient Medications  Medication Sig Dispense Refill  . acetaminophen (TYLENOL) 325 MG tablet Take 2 tablets (650 mg total) by mouth every 6 (six) hours as needed for mild pain or moderate pain. 60 tablet 0  . albuterol (PROVENTIL HFA;VENTOLIN HFA) 108 (90 Base) MCG/ACT inhaler Inhale 2 puffs into the lungs every 6 (six) hours as needed for wheezing or shortness of breath. 1 Inhaler 5  . albuterol (PROVENTIL) (2.5 MG/3ML) 0.083% nebulizer solution Take 3 mLs (2.5 mg total) by nebulization every 6 (six) hours as needed for wheezing or shortness of breath. 150 mL 1  . apixaban (ELIQUIS) 5 MG TABS tablet Take 1 tablet (5 mg total) by mouth 2 (two) times daily. 180 tablet 1  . calcitRIOL (ROCALTROL) 0.5 MCG capsule Take 2 mcg by mouth daily.     [name] CRANBERRY CONCENTRATE PO Take 30,000 mg by mouth daily.    . cyanocobalamin (,VITAMIN B-12,) 1000 MCG/ML injection ADMINISTER 1ML (1,000 MCG) UNDER THE SKIN EVERY 30 DAYS (Patient taking differently: Inject 1,000 mcg into the muscle every 30 (thirty) days. ) 3 mL 4  . desoximetasone (TOPICORT) 0.05 % cream Apply topically 2 (two) times daily as needed. (Patient taking differently: Apply 1 application topically 2 (two) times daily as needed (irritation). ) 60 g 0  . diclofenac sodium (VOLTAREN) 1 % GEL Apply 2 g topically 4 (four) times daily. (Patient  taking differently: Apply 2 g topically 4 (four) times daily as needed (pain). ) 100 g 3  . diltiazem (CARDIZEM CD) 120 MG 24 hr capsule Take 1 capsule (120 mg total) by mouth daily. 90 capsule 1  . DULoxetine (CYMBALTA) 30 MG capsule Take one 30 mg tablet by mouth once a day for one week. Then increase to one 30 mg tablet by mouth TWICE a day and stay at that dose thereafter. (Patient taking differently: Take 30 mg by mouth 2 (two) times daily. ) 60 capsule 1  . EPINEPHrine (EPIPEN 2-PAK) 0.3 mg/0.3 mL IJ SOAJ injection Inject 0.3 mg into the muscle once as needed for anaphylaxis (severe allergic reaction).     . furosemide (LASIX) 40 MG tablet Take 1 tablet (40 mg total) by mouth 2 (two) times daily. (Patient taking differently: Take 20 mg by mouth 2 (two) times daily. ) 270 tablet 3  . hydrocortisone cream 1 % Apply topically 4 (four) times daily as needed for itching. 30 g 0  . levocetirizine (XYZAL) 5 MG tablet TAKE 1 TABLET BY MOUTH  EVERY EVENING (Patient taking differently: Take 5 mg by mouth every morning. ) 90 tablet 3  . levothyroxine (SYNTHROID, LEVOTHROID) 100 MCG tablet Take 400 mcg by mouth daily before breakfast.     . Melatonin 5 MG TABS Take 5 mg by mouth at bedtime as needed (for sleep).     . MYRBETRIQ 50 MG TB24 tablet Take 50 mg by mouth daily.    [name] nystatin (MYCOSTATIN/NYSTOP) powder  Apply topically 2 (two) times daily. (Patient taking differently: Apply 1 application topically 2 (two) times daily as needed (irritation). ) 45 g 1  . nystatin cream (MYCOSTATIN) Apply 1 application topically 2 (two) times daily. 30 g 2  . omeprazole (PRILOSEC) 40 MG capsule Take 1 capsule (40 mg total) by mouth in the morning and at bedtime. 60 capsule 5  . ondansetron (ZOFRAN) 4 MG tablet Take 1 tablet (4 mg total) by mouth every 8 (eight) hours as needed for nausea or vomiting. 30 tablet 0  . simethicone (GAS-X) 80 MG chewable tablet Chew 1 tablet by mouth 4 (four) times daily as needed for  flatulence.     [name] spironolactone (ALDACTONE) 25 MG tablet Take 1 tablet (25 mg total) by mouth daily. 90 tablet 2  . SYMBICORT 160-4.5 MCG/ACT inhaler INHALE 2 PUFFS BY MOUTH TWO TIMES DAILY (Patient taking differently: Inhale 2 puffs into the lungs in the morning and at bedtime. ) 30.6 g 1  . Syringe/Needle, Disp, (SYRINGE 3CC/20GX1") 20G X 1" 3 ML MISC Use monthly as directed 50 each 0  . traMADol (ULTRAM) 50 MG tablet TAKE 1 TABLET(50 MG) BY MOUTH EVERY 12 HOURS AS NEEDED (Patient taking differently: Take 50 mg by mouth every 12 (twelve) hours as needed for moderate pain. TAKE 1 TABLET(50 MG) BY MOUTH EVERY 12 HOURS AS NEEDED) 30 tablet 0  . trimethoprim (TRIMPEX) 100 MG tablet Take 100 mg by mouth daily.    . Vitamin D, Ergocalciferol, (DRISDOL) 1.25 MG (50000 UNIT) CAPS capsule TAKE 1 CAPSULE BY MOUTH  EVERY MONDAY, WEDNESDAY,  AND FRIDAY. 39 capsule 1  . zafirlukast (ACCOLATE) 20 MG tablet Take 1 tablet (20 mg total) by mouth 2 (two) times daily before a meal. 120 tablet 0   No current facility-administered medications for this visit.     Past Medical History:  Diagnosis Date  . Abnormal Pap smear    years ago/no biopsy  . Allergic rhinitis   . Aortic atherosclerosis   . Arthritis    back- lower  . Asthma   . Atrial fibrillation 12/2010   OFF XARELTO LAST MONTH DUE TO BLEEDING IN STOOL  . Bursitis of hip left  . Chronic anticoagulation 12/16/2018   CHADS VASC=2 for sex and H/O HTN- she is on Eliquis  . Chronic diastolic (congestive) heart failure    pt unaware of this  . COPD (chronic obstructive pulmonary disease) with chronic bronchitis   . Decreased pulses in feet   . Dysrhythmia    afib, followed by Dr. Stanford Breed   . Edema of both legs   . Endometrial adenocarcinoma 02/21/2013   S/p D and C, has mirena, being followed by GYN   . Esophagitis 02/02/2014   Distal, linear erosions, noted on endoscopy  . Essential hypertension 01/05/2007   Echo Nov June 2020- EF 50-55%, mild  LVH, normal LA   . Fatty liver 01/07/2012  . Fibroid 1974   fibroid cyst on left fallopian tube  . Fibromyalgia   . Foot ulcer    AREA HEALED RIGHT FOOT  . Generalized anxiety disorder   . GERD (gastroesophageal reflux disease)   . Headache    occasional sinus headache   . Hepatomegaly   . History of blood transfusion JULY 2015  . History of cardiomegaly 12/06/2010   Noted on CT  . History of E. coli septicemia   . History of papillary thyroid carcinoma 04/07/2011  . Hypocalcemia 12/10/2016  . Hypokalemia 12/10/2016  .  Hypoparathyroidism 01/07/2011  . Hypothyroidism   . Iron deficiency anemia secondary to malabsorption  05/10/2007   Qualifier: Diagnosis of  By: Wynona Luna    . Kidney stone 2015/10/15   passed on their own  . Leukocytosis 03/24/2009   cta cheQualifier: Diagnosis of  By: Wynona Luna    . Major depressive disorder   . Mild neurocognitive disorder 06/28/2019  . Morbid obesity   . MRSA infection   . Nephrolithiasis 2/16, 9/16   SEES DR Risa Grill  . Osteomyelitis 06/14/2014  . PAF (paroxysmal atrial fibrillation) 02/05/2011   Documented 2012- NSR since.  On chronic anticoagulation.  . Pernicious anemia 02/20/2014   followed by Debbrah Alar  . Pneumonia   . PONV (postoperative nausea and vomiting)   . Pyelonephritis 11/24/2018  . Rash    thighs and back  . Seizures    infancy secondary to fever  . Staph aureus infection   . Thoracic spondylosis 12/06/2010   Noted on CT  . Uterine cancer 2014   Mirena IUD  . UTI (urinary tract infection) 12/13/2018  . Vitamin B 12 deficiency   . Vitamin D deficiency 05/10/2007   Qualifier: Diagnosis of  By: Wynona Luna     Past Surgical History:  Procedure Laterality Date  . AMPUTATION Right 05/18/2014   Procedure: RIGHT FIFTH RAY AMPUTATION FOOT;  Surgeon: Wylene Simmer, MD;  Location: [facility];  Service: Orthopedics;  Laterality: Right;  . APPENDECTOMY    . BUNIONECTOMY     bilateral  . COLONOSCOPY    .  COLONOSCOPY WITH PROPOFOL N/A 02/02/2014   Procedure: COLONOSCOPY WITH PROPOFOL;  Surgeon: Irene Shipper, MD;  Location: [facility];  Service: Endoscopy;  Laterality: N/A;  . cyst on ovary removed     . CYSTOSCOPY W/ RETROGRADES  03/03/2012   Procedure: CYSTOSCOPY WITH RETROGRADE PYELOGRAM;  Surgeon: Bernestine Amass, MD;  Location: [facility];  Service: Urology;  Laterality: Bilateral;  . CYSTOSCOPY W/ URETERAL STENT PLACEMENT Right 11/25/2017   Procedure: CYSTOSCOPY WITH RETROGRADE RIGHT URETERAL STENT PLACEMENT;  Surgeon: Franchot Gallo, MD;  Location: [facility];  Service: Urology;  Laterality: Right;  . CYSTOSCOPY W/ URETERAL STENT PLACEMENT Right 01/15/2018   Procedure: RIGHT STENT EXCHANGE;  Surgeon: Ardis Hughs, MD;  Location: [facility];  Service: Urology;  Laterality: Right;  . CYSTOSCOPY WITH RETROGRADE PYELOGRAM, URETEROSCOPY AND STENT PLACEMENT Left 08/25/2012   Procedure: CYSTOSCOPY WITH RETROGRADE PYELOGRAM, URETEROSCOPY;  Surgeon: Bernestine Amass, MD;  Location: [facility];  Service: Urology;  Laterality: Left;  . CYSTOSCOPY WITH STENT PLACEMENT Left 01/15/2018   Procedure: LEFT STENT PLACEMENT;  Surgeon: Ardis Hughs, MD;  Location: [facility];  Service: Urology;  Laterality: Left;  . CYSTOSCOPY WITH STENT PLACEMENT Right 01/22/2018   Procedure: RIGHT STENT REMOVAL;  Surgeon: Ardis Hughs, MD;  Location: [facility];  Service: Urology;  Laterality: Right;  . CYSTOSCOPY/URETEROSCOPY/HOLMIUM LASER/STENT PLACEMENT Right 12/23/2017   Procedure: RIGHT URETEROSCOPY STONE REMOVAL, HOLMIUM LASER, RIGHT /STENT EXCHANGE;  Surgeon: Ardis Hughs, MD;  Location: [facility];  Service: Urology;  Laterality: Right;  . CYSTOSCOPY/URETEROSCOPY/HOLMIUM LASER/STENT PLACEMENT Left 01/22/2018   Procedure: LEFT URETEROSCOPY STONE REMOVAL HOLMIUM LASER LEFT STENT Freddi Starr;  Surgeon: Ardis Hughs, MD;  Location: [facility];  Service: Urology;  Laterality: Left;  . DILATION AND CURETTAGE OF  UTERUS N/A 02/21/2013   Procedure: DILATATION AND CURETTAGE;  Surgeon: Lyman Speller, MD;  Location: [facility];  Service: Gynecology;  Laterality: N/A;  . DILATION AND CURETTAGE OF UTERUS N/A 04/09/2015   Procedure: [place] IUD removal;  Surgeon: Megan Salon, MD;  Location: [facility];  Service: Gynecology;  Laterality: N/A;  Patient weight 307lbs  . ESOPHAGOGASTRODUODENOSCOPY (EGD) WITH PROPOFOL N/A 02/02/2014   Procedure: ESOPHAGOGASTRODUODENOSCOPY (EGD) WITH PROPOFOL;  Surgeon: Irene Shipper, MD;  Location: [facility];  Service: Endoscopy;  Laterality: N/A;  . GASTRIC BYPASS  1974  . HOLMIUM LASER APPLICATION Left 0000000   Procedure: HOLMIUM LASER APPLICATION;  Surgeon: Bernestine Amass, MD;  Location: [facility];  Service: Urology;  Laterality: Left;  . LITHOTRIPSY  03/2012  . LITHOTRIPSY  2/16  . THYROIDECTOMY  05/15/2010  . TONSILLECTOMY AND ADENOIDECTOMY    . UPPER GASTROINTESTINAL ENDOSCOPY    . URETEROSCOPY  03/03/2012   Procedure: URETEROSCOPY;  Surgeon: Bernestine Amass, MD;  Location: [facility];  Service: Urology;  Laterality: Left;  . URETEROSCOPY WITH HOLMIUM LASER LITHOTRIPSY Bilateral 01/15/2018   Procedure: CYSTOSCOPY/BILATERAL URETEROSCOPY WITH HOLMIUM LASER LITHOTRIPSY STONE REMOVAL;  Surgeon: Ardis Hughs, MD;  Location: [facility];  Service: Urology;  Laterality: Bilateral;    Social History   Socioeconomic History  . Marital status: Single    Spouse name: Not on file  . Number of children: 0  . Years of education: 4  . Highest education level: Some college, no degree  Occupational History  . Occupation: works in Insurance claims handler  . Occupation: DESIGN COMPUTER CHIP    Employer: [employer]  Tobacco Use  . Smoking status: Former Smoker    Packs/day: 0.50    Years: 25.00    Pack years: 12.50    Types: Cigarettes    Start date: 12/24/1970    Quit date: 05/27/1995    Years since quitting: 24.3  . Smokeless tobacco: Never Used  Substance and  Sexual Activity  . Alcohol use: Yes    Alcohol/week: 0.0 standard drinks    Comment: <12 oz per month  . Drug use: No  . Sexual activity: Yes    Partners: Male    Birth control/protection: Post-menopausal  Other Topics Concern  . Not on file  Social History Narrative   Occupation: works in Insurance claims handler - Field seismologist   Single       Former Smoker - quit tobacco 12 years ago.  She was light smoker for 10 years.             College edu      Right handed      Lives alone in one story home. Has steps to enter home.      Social Determinants of Health   Financial Resource Strain:   . Difficulty of Paying Living Expenses:   Food Insecurity:   . Worried About Charity fundraiser in the Last Year:   . Arboriculturist in the Last Year:   Transportation Needs:   . Film/video editor (Medical):   [name] Lack of Transportation (Non-Medical):   Physical Activity:   . Days of Exercise per Week:   . Minutes of Exercise per Session:   Stress:   . Feeling of Stress :   Social Connections:   . Frequency of Communication with Friends and Family:   . Frequency of Social Gatherings with Friends and Family:   . Attends Religious Services:   . Active Member of Clubs or Organizations:   . Attends Archivist Meetings:   [name] Marital Status:  Intimate Partner Violence:   . Fear of Current or Ex-Partner:   . Emotionally Abused:   [name] Physically Abused:   . Sexually Abused:     Family History  Problem Relation Age of Onset  . Hypertension Father   . Diabetes Father   . Lung cancer Father   . Heart attack Father        MI at age 14  . Hypertension Mother   . Hyperthyroidism Mother   . Heart disease Mother   . Heart attack Mother        MI at age 38  . Asthma Brother   . Hypertension Brother        younger  . Heart disease Brother        older  . Dementia Paternal Aunt        Unspecified type; symptoms emerged with advanced age  . Colon cancer Neg Hx   .  Esophageal cancer Neg Hx   . Stomach cancer Neg Hx   . Kidney disease Neg Hx   . Liver disease Neg Hx   . Pancreatic cancer Neg Hx     ROS: no fevers or chills, productive cough, hemoptysis, dysphasia, odynophagia, melena, hematochezia, dysuria, hematuria, rash, seizure activity, orthopnea, PND, pedal edema, claudication. Remaining systems are negative.  Physical Exam: Well-developed well-nourished in no acute distress.  Skin is warm and dry.  HEENT is normal.  Neck is supple.  Chest is clear to auscultation with normal expansion.  Cardiovascular exam is regular rate and rhythm.  Abdominal exam nontender or distended. No masses palpated. Extremities show no edema. neuro grossly intact  ECG- personally reviewed  A/P  1 paroxysmal atrial fibrillation-patient remains in sinus rhythm today.  We will continue Cardizem for rate control if atrial fibrillation recurs.  She discontinued her anticoagulation previously due to bruising.  We discussed the risks of embolic event including CVA but she declines anticoagulation.  2 hypertension-patient's blood pressure is controlled today.  Continue present medical regimen.  3 lower extremity edema-we will continue Lasix.  Check potassium and renal function.  4 history of morbid obesity-  Kirk Ruths, MD

## 2019-10-09 ENCOUNTER — Encounter (HOSPITAL_COMMUNITY): Payer: Self-pay | Admitting: Anesthesiology

## 2019-10-09 NOTE — Anesthesia Preprocedure Evaluation (Deleted)
Anesthesia Evaluation    Airway        Dental   Pulmonary asthma , COPD, former smoker,           Cardiovascular hypertension, +CHF  + dysrhythmias Atrial Fibrillation      Neuro/Psych  Headaches, Seizures -,  Cervical spinal stenosis  Neuromuscular disease    GI/Hepatic GERD  ,  Endo/Other  Hypothyroidism Morbid obesity  Renal/GU Renal disease     Musculoskeletal  (+) Arthritis , Fibromyalgia -  Abdominal   Peds  Hematology  (+) anemia ,   Anesthesia Other Findings   Reproductive/Obstetrics                             Anesthesia Physical Anesthesia Plan  ASA: III  Anesthesia Plan: MAC   Post-op Pain Management:    Induction:   PONV Risk Score and Plan: 2 and Propofol infusion, Ondansetron and Treatment may vary due to age or medical condition  Airway Management Planned: Natural Airway  Additional Equipment:   Intra-op Plan:   Post-operative Plan:   Informed Consent:   Plan Discussed with:   Anesthesia Plan Comments:         Anesthesia Quick Evaluation

## 2019-10-10 ENCOUNTER — Ambulatory Visit (HOSPITAL_COMMUNITY): Admission: RE | Admit: 2019-10-10 | Payer: 59 | Source: Home / Self Care | Admitting: Internal Medicine

## 2019-10-10 SURGERY — COLONOSCOPY WITH PROPOFOL
Anesthesia: Monitor Anesthesia Care

## 2019-10-10 MED ORDER — PROPOFOL 500 MG/50ML IV EMUL
INTRAVENOUS | Status: AC
  Filled 2019-10-10: qty 50

## 2019-10-10 NOTE — Progress Notes (Signed)
Pt had not shown up for procedure. I called her house and she said she was cancelling and was not coming. Pt said she could not drink the prep. I told her to call Dr. Blanch Media office to reschedule.

## 2019-10-12 ENCOUNTER — Ambulatory Visit: Payer: 59 | Admitting: Cardiology

## 2019-10-13 NOTE — Progress Notes (Deleted)
HPI: FU atrial fibrillation. Myoview July 2012showed EF64% mild ischemia in the inferior wall. We elected to treat medically. Patient was also having palpitations and near syncopal episodes. A CardioNet revealed sinus rhythm with PACs, brief runs of PAT and brief runs of PAF. MRA in September of 2014 showed no aneurysm. Xarelto DCed previously due to GI bleed; WU showed esophagitis; anticoagulation resumed.ABIs November 2019-waveform analysis showed no evidence of significant right or left lower extremity arterial disease.  Echocardiogram June 2020 showed normal LV function.  Since last seen,   Current Outpatient Medications  Medication Sig Dispense Refill  . acetaminophen (TYLENOL) 325 MG tablet Take 2 tablets (650 mg total) by mouth every 6 (six) hours as needed for mild pain or moderate pain. 60 tablet 0  . albuterol (PROVENTIL HFA;VENTOLIN HFA) 108 (90 Base) MCG/ACT inhaler Inhale 2 puffs into the lungs every 6 (six) hours as needed for wheezing or shortness of breath. 1 Inhaler 5  . albuterol (PROVENTIL) (2.5 MG/3ML) 0.083% nebulizer solution Take 3 mLs (2.5 mg total) by nebulization every 6 (six) hours as needed for wheezing or shortness of breath. 150 mL 1  . apixaban (ELIQUIS) 5 MG TABS tablet Take 1 tablet (5 mg total) by mouth 2 (two) times daily. 180 tablet 1  . calcitRIOL (ROCALTROL) 0.5 MCG capsule Take 2 mcg by mouth daily.     [name] CRANBERRY CONCENTRATE PO Take 30,000 mg by mouth daily.    . cyanocobalamin (,VITAMIN B-12,) 1000 MCG/ML injection ADMINISTER 1ML (1,000 MCG) UNDER THE SKIN EVERY 30 DAYS (Patient taking differently: Inject 1,000 mcg into the muscle every 30 (thirty) days. ) 3 mL 4  . desoximetasone (TOPICORT) 0.05 % cream Apply topically 2 (two) times daily as needed. (Patient taking differently: Apply 1 application topically 2 (two) times daily as needed (irritation). ) 60 g 0  . diclofenac sodium (VOLTAREN) 1 % GEL Apply 2 g topically 4 (four) times daily. (Patient  taking differently: Apply 2 g topically 4 (four) times daily as needed (pain). ) 100 g 3  . diltiazem (CARDIZEM CD) 120 MG 24 hr capsule Take 1 capsule (120 mg total) by mouth daily. 90 capsule 1  . DULoxetine (CYMBALTA) 30 MG capsule Take one 30 mg tablet by mouth once a day for one week. Then increase to one 30 mg tablet by mouth TWICE a day and stay at that dose thereafter. (Patient taking differently: Take 30 mg by mouth 2 (two) times daily. ) 60 capsule 1  . EPINEPHrine (EPIPEN 2-PAK) 0.3 mg/0.3 mL IJ SOAJ injection Inject 0.3 mg into the muscle once as needed for anaphylaxis (severe allergic reaction).     . furosemide (LASIX) 40 MG tablet Take 1 tablet (40 mg total) by mouth 2 (two) times daily. (Patient taking differently: Take 20 mg by mouth 2 (two) times daily. ) 270 tablet 3  . hydrocortisone cream 1 % Apply topically 4 (four) times daily as needed for itching. 30 g 0  . levocetirizine (XYZAL) 5 MG tablet TAKE 1 TABLET BY MOUTH  EVERY EVENING (Patient taking differently: Take 5 mg by mouth every morning. ) 90 tablet 3  . levothyroxine (SYNTHROID, LEVOTHROID) 100 MCG tablet Take 400 mcg by mouth daily before breakfast.     . Melatonin 5 MG TABS Take 5 mg by mouth at bedtime as needed (for sleep).     . MYRBETRIQ 50 MG TB24 tablet Take 50 mg by mouth daily.    [name] nystatin (MYCOSTATIN/NYSTOP) powder  Apply topically 2 (two) times daily. (Patient taking differently: Apply 1 application topically 2 (two) times daily as needed (irritation). ) 45 g 1  . nystatin cream (MYCOSTATIN) Apply 1 application topically 2 (two) times daily. 30 g 2  . omeprazole (PRILOSEC) 40 MG capsule Take 1 capsule (40 mg total) by mouth in the morning and at bedtime. 60 capsule 5  . ondansetron (ZOFRAN) 4 MG tablet Take 1 tablet (4 mg total) by mouth every 8 (eight) hours as needed for nausea or vomiting. 30 tablet 0  . simethicone (GAS-X) 80 MG chewable tablet Chew 1 tablet by mouth 4 (four) times daily as needed for  flatulence.     [name] spironolactone (ALDACTONE) 25 MG tablet Take 1 tablet (25 mg total) by mouth daily. 90 tablet 2  . SYMBICORT 160-4.5 MCG/ACT inhaler INHALE 2 PUFFS BY MOUTH TWO TIMES DAILY (Patient taking differently: Inhale 2 puffs into the lungs in the morning and at bedtime. ) 30.6 g 1  . Syringe/Needle, Disp, (SYRINGE 3CC/20GX1") 20G X 1" 3 ML MISC Use monthly as directed 50 each 0  . traMADol (ULTRAM) 50 MG tablet TAKE 1 TABLET(50 MG) BY MOUTH EVERY 12 HOURS AS NEEDED (Patient taking differently: Take 50 mg by mouth every 12 (twelve) hours as needed for moderate pain. TAKE 1 TABLET(50 MG) BY MOUTH EVERY 12 HOURS AS NEEDED) 30 tablet 0  . trimethoprim (TRIMPEX) 100 MG tablet Take 100 mg by mouth daily.    . Vitamin D, Ergocalciferol, (DRISDOL) 1.25 MG (50000 UNIT) CAPS capsule TAKE 1 CAPSULE BY MOUTH  EVERY MONDAY, WEDNESDAY,  AND FRIDAY. 39 capsule 1  . zafirlukast (ACCOLATE) 20 MG tablet Take 1 tablet (20 mg total) by mouth 2 (two) times daily before a meal. 120 tablet 0   No current facility-administered medications for this visit.     Past Medical History:  Diagnosis Date  . Abnormal Pap smear    years ago/no biopsy  . Allergic rhinitis   . Aortic atherosclerosis   . Arthritis    back- lower  . Asthma   . Atrial fibrillation 12/2010   OFF XARELTO LAST MONTH DUE TO BLEEDING IN STOOL  . Bursitis of hip left  . Chronic anticoagulation 12/16/2018   CHADS VASC=2 for sex and H/O HTN- she is on Eliquis  . Chronic diastolic (congestive) heart failure    pt unaware of this  . COPD (chronic obstructive pulmonary disease) with chronic bronchitis   . Decreased pulses in feet   . Dysrhythmia    afib, followed by Dr. Stanford Breed   . Edema of both legs   . Endometrial adenocarcinoma 02/21/2013   S/p D and C, has mirena, being followed by GYN   . Esophagitis 02/02/2014   Distal, linear erosions, noted on endoscopy  . Essential hypertension 01/05/2007   Echo Nov June 2020- EF 50-55%, mild  LVH, normal LA   . Fatty liver 01/07/2012  . Fibroid 1974   fibroid cyst on left fallopian tube  . Fibromyalgia   . Foot ulcer    AREA HEALED RIGHT FOOT  . Generalized anxiety disorder   . GERD (gastroesophageal reflux disease)   . Headache    occasional sinus headache   . Hepatomegaly   . History of blood transfusion JULY 2015  . History of cardiomegaly 12/06/2010   Noted on CT  . History of E. coli septicemia   . History of papillary thyroid carcinoma 04/07/2011  . Hypocalcemia 12/10/2016  . Hypokalemia 12/10/2016  .  Hypoparathyroidism 01/07/2011  . Hypothyroidism   . Iron deficiency anemia secondary to malabsorption  05/10/2007   Qualifier: Diagnosis of  By: Wynona Luna    . Kidney stone 2015-09-19   passed on their own  . Leukocytosis 03/24/2009   cta cheQualifier: Diagnosis of  By: Wynona Luna    . Major depressive disorder   . Mild neurocognitive disorder 06/28/2019  . Morbid obesity   . MRSA infection   . Nephrolithiasis 2/16, 9/16   SEES DR Risa Grill  . Osteomyelitis 06/14/2014  . PAF (paroxysmal atrial fibrillation) 02/05/2011   Documented 2012- NSR since.  On chronic anticoagulation.  . Pernicious anemia 02/20/2014   followed by Debbrah Alar  . Pneumonia   . PONV (postoperative nausea and vomiting)   . Pyelonephritis 11/24/2018  . Rash    thighs and back  . Seizures    infancy secondary to fever  . Staph aureus infection   . Thoracic spondylosis 12/06/2010   Noted on CT  . Uterine cancer 2014   Mirena IUD  . UTI (urinary tract infection) 12/13/2018  . Vitamin B 12 deficiency   . Vitamin D deficiency 05/10/2007   Qualifier: Diagnosis of  By: Wynona Luna     Past Surgical History:  Procedure Laterality Date  . AMPUTATION Right 05/18/2014   Procedure: RIGHT FIFTH RAY AMPUTATION FOOT;  Surgeon: Wylene Simmer, MD;  Location: [facility];  Service: Orthopedics;  Laterality: Right;  . APPENDECTOMY    . BUNIONECTOMY     bilateral  . COLONOSCOPY    .  COLONOSCOPY WITH PROPOFOL N/A 02/02/2014   Procedure: COLONOSCOPY WITH PROPOFOL;  Surgeon: Irene Shipper, MD;  Location: [facility];  Service: Endoscopy;  Laterality: N/A;  . cyst on ovary removed     . CYSTOSCOPY W/ RETROGRADES  03/03/2012   Procedure: CYSTOSCOPY WITH RETROGRADE PYELOGRAM;  Surgeon: Bernestine Amass, MD;  Location: [facility];  Service: Urology;  Laterality: Bilateral;  . CYSTOSCOPY W/ URETERAL STENT PLACEMENT Right 11/25/2017   Procedure: CYSTOSCOPY WITH RETROGRADE RIGHT URETERAL STENT PLACEMENT;  Surgeon: Franchot Gallo, MD;  Location: [facility];  Service: Urology;  Laterality: Right;  . CYSTOSCOPY W/ URETERAL STENT PLACEMENT Right 01/15/2018   Procedure: RIGHT STENT EXCHANGE;  Surgeon: Ardis Hughs, MD;  Location: [facility];  Service: Urology;  Laterality: Right;  . CYSTOSCOPY WITH RETROGRADE PYELOGRAM, URETEROSCOPY AND STENT PLACEMENT Left 08/25/2012   Procedure: CYSTOSCOPY WITH RETROGRADE PYELOGRAM, URETEROSCOPY;  Surgeon: Bernestine Amass, MD;  Location: [facility];  Service: Urology;  Laterality: Left;  . CYSTOSCOPY WITH STENT PLACEMENT Left 01/15/2018   Procedure: LEFT STENT PLACEMENT;  Surgeon: Ardis Hughs, MD;  Location: [facility];  Service: Urology;  Laterality: Left;  . CYSTOSCOPY WITH STENT PLACEMENT Right 01/22/2018   Procedure: RIGHT STENT REMOVAL;  Surgeon: Ardis Hughs, MD;  Location: [facility];  Service: Urology;  Laterality: Right;  . CYSTOSCOPY/URETEROSCOPY/HOLMIUM LASER/STENT PLACEMENT Right 12/23/2017   Procedure: RIGHT URETEROSCOPY STONE REMOVAL, HOLMIUM LASER, RIGHT /STENT EXCHANGE;  Surgeon: Ardis Hughs, MD;  Location: [facility];  Service: Urology;  Laterality: Right;  . CYSTOSCOPY/URETEROSCOPY/HOLMIUM LASER/STENT PLACEMENT Left 01/22/2018   Procedure: LEFT URETEROSCOPY STONE REMOVAL HOLMIUM LASER LEFT STENT Freddi Starr;  Surgeon: Ardis Hughs, MD;  Location: [facility];  Service: Urology;  Laterality: Left;  . DILATION AND CURETTAGE OF  UTERUS N/A 02/21/2013   Procedure: DILATATION AND CURETTAGE;  Surgeon: Lyman Speller, MD;  Location: [facility];  Service: Gynecology;  Laterality: N/A;  . DILATION AND CURETTAGE OF UTERUS N/A 04/09/2015   Procedure: [place] IUD removal;  Surgeon: Megan Salon, MD;  Location: [facility];  Service: Gynecology;  Laterality: N/A;  Patient weight 307lbs  . ESOPHAGOGASTRODUODENOSCOPY (EGD) WITH PROPOFOL N/A 02/02/2014   Procedure: ESOPHAGOGASTRODUODENOSCOPY (EGD) WITH PROPOFOL;  Surgeon: Irene Shipper, MD;  Location: [facility];  Service: Endoscopy;  Laterality: N/A;  . GASTRIC BYPASS  1974  . HOLMIUM LASER APPLICATION Left 0000000   Procedure: HOLMIUM LASER APPLICATION;  Surgeon: Bernestine Amass, MD;  Location: [facility];  Service: Urology;  Laterality: Left;  . LITHOTRIPSY  03/2012  . LITHOTRIPSY  2/16  . THYROIDECTOMY  05/15/2010  . TONSILLECTOMY AND ADENOIDECTOMY    . UPPER GASTROINTESTINAL ENDOSCOPY    . URETEROSCOPY  03/03/2012   Procedure: URETEROSCOPY;  Surgeon: Bernestine Amass, MD;  Location: [facility];  Service: Urology;  Laterality: Left;  . URETEROSCOPY WITH HOLMIUM LASER LITHOTRIPSY Bilateral 01/15/2018   Procedure: CYSTOSCOPY/BILATERAL URETEROSCOPY WITH HOLMIUM LASER LITHOTRIPSY STONE REMOVAL;  Surgeon: Ardis Hughs, MD;  Location: [facility];  Service: Urology;  Laterality: Bilateral;    Social History   Socioeconomic History  . Marital status: Single    Spouse name: Not on file  . Number of children: 0  . Years of education: 54  . Highest education level: Some college, no degree  Occupational History  . Occupation: works in Insurance claims handler  . Occupation: DESIGN COMPUTER CHIP    Employer: [employer]  Tobacco Use  . Smoking status: Former Smoker    Packs/day: 0.50    Years: 25.00    Pack years: 12.50    Types: Cigarettes    Start date: 12/24/1970    Quit date: 05/27/1995    Years since quitting: 24.3  . Smokeless tobacco: Never Used  Substance and  Sexual Activity  . Alcohol use: Yes    Alcohol/week: 0.0 standard drinks    Comment: <12 oz per month  . Drug use: No  . Sexual activity: Yes    Partners: Male    Birth control/protection: Post-menopausal  Other Topics Concern  . Not on file  Social History Narrative   Occupation: works in Insurance claims handler - Field seismologist   Single       Former Smoker - quit tobacco 12 years ago.  She was light smoker for 10 years.             College edu      Right handed      Lives alone in one story home. Has steps to enter home.      Social Determinants of Health   Financial Resource Strain:   . Difficulty of Paying Living Expenses:   Food Insecurity:   . Worried About Charity fundraiser in the Last Year:   . Arboriculturist in the Last Year:   Transportation Needs:   . Film/video editor (Medical):   [name] Lack of Transportation (Non-Medical):   Physical Activity:   . Days of Exercise per Week:   . Minutes of Exercise per Session:   Stress:   . Feeling of Stress :   Social Connections:   . Frequency of Communication with Friends and Family:   . Frequency of Social Gatherings with Friends and Family:   . Attends Religious Services:   . Active Member of Clubs or Organizations:   . Attends Archivist Meetings:   [name] Marital Status:  Intimate Partner Violence:   . Fear of Current or Ex-Partner:   . Emotionally Abused:   [name] Physically Abused:   . Sexually Abused:     Family History  Problem Relation Age of Onset  . Hypertension Father   . Diabetes Father   . Lung cancer Father   . Heart attack Father        MI at age 42  . Hypertension Mother   . Hyperthyroidism Mother   . Heart disease Mother   . Heart attack Mother        MI at age 63  . Asthma Brother   . Hypertension Brother        younger  . Heart disease Brother        older  . Dementia Paternal Aunt        Unspecified type; symptoms emerged with advanced age  . Colon cancer Neg Hx   .  Esophageal cancer Neg Hx   . Stomach cancer Neg Hx   . Kidney disease Neg Hx   . Liver disease Neg Hx   . Pancreatic cancer Neg Hx     ROS: no fevers or chills, productive cough, hemoptysis, dysphasia, odynophagia, melena, hematochezia, dysuria, hematuria, rash, seizure activity, orthopnea, PND, pedal edema, claudication. Remaining systems are negative.  Physical Exam: Well-developed well-nourished in no acute distress.  Skin is warm and dry.  HEENT is normal.  Neck is supple.  Chest is clear to auscultation with normal expansion.  Cardiovascular exam is regular rate and rhythm.  Abdominal exam nontender or distended. No masses palpated. Extremities show no edema. neuro grossly intact  ECG- personally reviewed  A/P  1 paroxysmal atrial fibrillation-patient remains in sinus rhythm today.  We will continue Cardizem for rate control if atrial fibrillation recurs.  She discontinued her anticoagulation previously due to bruising.  We discussed the risks of embolic event including CVA but she declines anticoagulation.  2 hypertension-patient's blood pressure is controlled today.  Continue present medical regimen.  3 lower extremity edema-we will continue Lasix.  Check potassium and renal function.  4 history of morbid obesity-  Kirk Ruths, MD

## 2019-10-14 ENCOUNTER — Ambulatory Visit: Payer: 59 | Admitting: Internal Medicine

## 2019-10-14 ENCOUNTER — Emergency Department (HOSPITAL_COMMUNITY): Payer: 59

## 2019-10-14 ENCOUNTER — Inpatient Hospital Stay (HOSPITAL_COMMUNITY)
Admission: EM | Admit: 2019-10-14 | Discharge: 2019-10-22 | DRG: 871 | Disposition: A | Discharge status: Skilled Nursing Facility | Payer: 59 | Attending: Internal Medicine | Admitting: Internal Medicine

## 2019-10-14 ENCOUNTER — Other Ambulatory Visit: Payer: Self-pay

## 2019-10-14 ENCOUNTER — Encounter (HOSPITAL_COMMUNITY): Payer: Self-pay | Admitting: *Deleted

## 2019-10-14 ENCOUNTER — Inpatient Hospital Stay (HOSPITAL_COMMUNITY): Payer: 59

## 2019-10-14 DIAGNOSIS — T68XXXA Hypothermia, initial encounter: Secondary | ICD-10-CM

## 2019-10-14 DIAGNOSIS — N390 Urinary tract infection, site not specified: Secondary | ICD-10-CM | POA: Diagnosis present

## 2019-10-14 DIAGNOSIS — K219 Gastro-esophageal reflux disease without esophagitis: Secondary | ICD-10-CM | POA: Diagnosis present

## 2019-10-14 DIAGNOSIS — Z79899 Other long term (current) drug therapy: Secondary | ICD-10-CM

## 2019-10-14 DIAGNOSIS — Z801 Family history of malignant neoplasm of trachea, bronchus and lung: Secondary | ICD-10-CM

## 2019-10-14 DIAGNOSIS — Z6841 Body Mass Index (BMI) 40.0 and over, adult: Secondary | ICD-10-CM

## 2019-10-14 DIAGNOSIS — Z20822 Contact with and (suspected) exposure to covid-19: Secondary | ICD-10-CM | POA: Diagnosis present

## 2019-10-14 DIAGNOSIS — D509 Iron deficiency anemia, unspecified: Secondary | ICD-10-CM | POA: Diagnosis present

## 2019-10-14 DIAGNOSIS — K760 Fatty (change of) liver, not elsewhere classified: Secondary | ICD-10-CM | POA: Diagnosis present

## 2019-10-14 DIAGNOSIS — G92 Toxic encephalopathy: Secondary | ICD-10-CM | POA: Diagnosis present

## 2019-10-14 DIAGNOSIS — W1830XA Fall on same level, unspecified, initial encounter: Secondary | ICD-10-CM | POA: Diagnosis present

## 2019-10-14 DIAGNOSIS — M25561 Pain in right knee: Secondary | ICD-10-CM | POA: Diagnosis not present

## 2019-10-14 DIAGNOSIS — K7290 Hepatic failure, unspecified without coma: Secondary | ICD-10-CM | POA: Diagnosis not present

## 2019-10-14 DIAGNOSIS — D649 Anemia, unspecified: Secondary | ICD-10-CM | POA: Diagnosis not present

## 2019-10-14 DIAGNOSIS — I700 Atherosclerosis of aorta: Secondary | ICD-10-CM | POA: Diagnosis present

## 2019-10-14 DIAGNOSIS — E890 Postprocedural hypothyroidism: Secondary | ICD-10-CM | POA: Diagnosis present

## 2019-10-14 DIAGNOSIS — R161 Splenomegaly, not elsewhere classified: Secondary | ICD-10-CM | POA: Diagnosis present

## 2019-10-14 DIAGNOSIS — M797 Fibromyalgia: Secondary | ICD-10-CM | POA: Diagnosis present

## 2019-10-14 DIAGNOSIS — Z833 Family history of diabetes mellitus: Secondary | ICD-10-CM

## 2019-10-14 DIAGNOSIS — Z91018 Allergy to other foods: Secondary | ICD-10-CM

## 2019-10-14 DIAGNOSIS — A419 Sepsis, unspecified organism: Secondary | ICD-10-CM | POA: Diagnosis present

## 2019-10-14 DIAGNOSIS — Z825 Family history of asthma and other chronic lower respiratory diseases: Secondary | ICD-10-CM

## 2019-10-14 DIAGNOSIS — M25569 Pain in unspecified knee: Secondary | ICD-10-CM

## 2019-10-14 DIAGNOSIS — E872 Acidosis: Secondary | ICD-10-CM | POA: Diagnosis present

## 2019-10-14 DIAGNOSIS — M1711 Unilateral primary osteoarthritis, right knee: Secondary | ICD-10-CM | POA: Diagnosis present

## 2019-10-14 DIAGNOSIS — N179 Acute kidney failure, unspecified: Secondary | ICD-10-CM

## 2019-10-14 DIAGNOSIS — I110 Hypertensive heart disease with heart failure: Secondary | ICD-10-CM | POA: Diagnosis present

## 2019-10-14 DIAGNOSIS — D6959 Other secondary thrombocytopenia: Secondary | ICD-10-CM | POA: Diagnosis not present

## 2019-10-14 DIAGNOSIS — J449 Chronic obstructive pulmonary disease, unspecified: Secondary | ICD-10-CM | POA: Diagnosis present

## 2019-10-14 DIAGNOSIS — D696 Thrombocytopenia, unspecified: Secondary | ICD-10-CM

## 2019-10-14 DIAGNOSIS — Z8542 Personal history of malignant neoplasm of other parts of uterus: Secondary | ICD-10-CM | POA: Diagnosis not present

## 2019-10-14 DIAGNOSIS — R4182 Altered mental status, unspecified: Secondary | ICD-10-CM | POA: Diagnosis present

## 2019-10-14 DIAGNOSIS — T68XXXD Hypothermia, subsequent encounter: Secondary | ICD-10-CM | POA: Diagnosis not present

## 2019-10-14 DIAGNOSIS — Z881 Allergy status to other antibiotic agents status: Secondary | ICD-10-CM

## 2019-10-14 DIAGNOSIS — Z7901 Long term (current) use of anticoagulants: Secondary | ICD-10-CM

## 2019-10-14 DIAGNOSIS — E209 Hypoparathyroidism, unspecified: Secondary | ICD-10-CM | POA: Diagnosis present

## 2019-10-14 DIAGNOSIS — K7460 Unspecified cirrhosis of liver: Secondary | ICD-10-CM | POA: Diagnosis present

## 2019-10-14 DIAGNOSIS — E538 Deficiency of other specified B group vitamins: Secondary | ICD-10-CM | POA: Diagnosis present

## 2019-10-14 DIAGNOSIS — E610 Copper deficiency: Secondary | ICD-10-CM | POA: Diagnosis present

## 2019-10-14 DIAGNOSIS — I5032 Chronic diastolic (congestive) heart failure: Secondary | ICD-10-CM | POA: Diagnosis present

## 2019-10-14 DIAGNOSIS — D638 Anemia in other chronic diseases classified elsewhere: Secondary | ICD-10-CM | POA: Diagnosis present

## 2019-10-14 DIAGNOSIS — Z79890 Hormone replacement therapy (postmenopausal): Secondary | ICD-10-CM

## 2019-10-14 DIAGNOSIS — I480 Paroxysmal atrial fibrillation: Secondary | ICD-10-CM | POA: Diagnosis present

## 2019-10-14 DIAGNOSIS — Z8249 Family history of ischemic heart disease and other diseases of the circulatory system: Secondary | ICD-10-CM

## 2019-10-14 DIAGNOSIS — G8929 Other chronic pain: Secondary | ICD-10-CM | POA: Diagnosis not present

## 2019-10-14 DIAGNOSIS — M25461 Effusion, right knee: Secondary | ICD-10-CM | POA: Diagnosis present

## 2019-10-14 DIAGNOSIS — A4151 Sepsis due to Escherichia coli [E. coli]: Principal | ICD-10-CM | POA: Diagnosis present

## 2019-10-14 DIAGNOSIS — Z87891 Personal history of nicotine dependence: Secondary | ICD-10-CM

## 2019-10-14 DIAGNOSIS — E860 Dehydration: Secondary | ICD-10-CM | POA: Diagnosis present

## 2019-10-14 DIAGNOSIS — Z880 Allergy status to penicillin: Secondary | ICD-10-CM

## 2019-10-14 DIAGNOSIS — R7989 Other specified abnormal findings of blood chemistry: Secondary | ICD-10-CM | POA: Diagnosis not present

## 2019-10-14 DIAGNOSIS — R680 Hypothermia, not associated with low environmental temperature: Secondary | ICD-10-CM | POA: Diagnosis present

## 2019-10-14 DIAGNOSIS — W19XXXA Unspecified fall, initial encounter: Secondary | ICD-10-CM

## 2019-10-14 DIAGNOSIS — R6520 Severe sepsis without septic shock: Secondary | ICD-10-CM | POA: Diagnosis present

## 2019-10-14 DIAGNOSIS — K5900 Constipation, unspecified: Secondary | ICD-10-CM | POA: Diagnosis present

## 2019-10-14 DIAGNOSIS — R319 Hematuria, unspecified: Secondary | ICD-10-CM | POA: Diagnosis not present

## 2019-10-14 LAB — URINALYSIS, ROUTINE W REFLEX MICROSCOPIC
Bilirubin Urine: NEGATIVE
Glucose, UA: NEGATIVE mg/dL
Ketones, ur: NEGATIVE mg/dL
Nitrite: POSITIVE — AB
Protein, ur: 100 mg/dL — AB
RBC / HPF: >50 RBC/hpf — ABNORMAL HIGH (ref 0–5)
Specific Gravity, Urine: 1.011 (ref 1.005–1.030)
WBC, UA: >50 WBC/hpf — ABNORMAL HIGH (ref 0–5)
pH: 5 (ref 5.0–8.0)

## 2019-10-14 LAB — COMPREHENSIVE METABOLIC PANEL
ALT: 24 U/L (ref 0–44)
AST: 21 U/L (ref 15–41)
Albumin: 2.5 g/dL — ABNORMAL LOW (ref 3.5–5.0)
Alkaline Phosphatase: 125 U/L (ref 38–126)
Anion gap: 18 — ABNORMAL HIGH (ref 5–15)
BUN: 70 mg/dL — ABNORMAL HIGH (ref 8–23)
CO2: 18 mmol/L — ABNORMAL LOW (ref 22–32)
Calcium: 8.2 mg/dL — ABNORMAL LOW (ref 8.9–10.3)
Chloride: 105 mmol/L (ref 98–111)
Creatinine, Ser: 2.06 mg/dL — ABNORMAL HIGH (ref 0.44–1.00)
GFR calc Af Amer: 29 mL/min — ABNORMAL LOW (ref >60)
GFR calc non Af Amer: 25 mL/min — ABNORMAL LOW (ref >60)
Glucose, Bld: 85 mg/dL (ref 70–99)
Potassium: 4.1 mmol/L (ref 3.5–5.1)
Sodium: 141 mmol/L (ref 135–145)
Total Bilirubin: 1.4 mg/dL — ABNORMAL HIGH (ref 0.3–1.2)
Total Protein: 6.4 g/dL — ABNORMAL LOW (ref 6.5–8.1)

## 2019-10-14 LAB — CBC WITH DIFFERENTIAL/PLATELET
Abs Immature Granulocytes: 0.03 10*3/uL (ref 0.00–0.07)
Basophils Absolute: 0 10*3/uL (ref 0.0–0.1)
Basophils Relative: 0 %
Eosinophils Absolute: 0 10*3/uL (ref 0.0–0.5)
Eosinophils Relative: 1 %
HCT: 26.7 % — ABNORMAL LOW (ref 36.0–46.0)
Hemoglobin: 7.8 g/dL — ABNORMAL LOW (ref 12.0–15.0)
Immature Granulocytes: 1 %
Lymphocytes Relative: 3 %
Lymphs Abs: 0.2 10*3/uL — ABNORMAL LOW (ref 0.7–4.0)
MCH: 27.7 pg (ref 26.0–34.0)
MCHC: 29.2 g/dL — ABNORMAL LOW (ref 30.0–36.0)
MCV: 94.7 fL (ref 80.0–100.0)
Monocytes Absolute: 0.3 10*3/uL (ref 0.1–1.0)
Monocytes Relative: 6 %
Neutro Abs: 5.3 10*3/uL (ref 1.7–7.7)
Neutrophils Relative %: 89 %
Platelets: 43 10*3/uL — ABNORMAL LOW (ref 150–400)
RBC: 2.82 MIL/uL — ABNORMAL LOW (ref 3.87–5.11)
RDW: 18.7 % — ABNORMAL HIGH (ref 11.5–15.5)
WBC: 5.9 10*3/uL (ref 4.0–10.5)
nRBC: 0.5 % — ABNORMAL HIGH (ref 0.0–0.2)

## 2019-10-14 LAB — PROTIME-INR
INR: 2.2 — ABNORMAL HIGH (ref 0.8–1.2)
Prothrombin Time: 23.4 seconds — ABNORMAL HIGH (ref 11.4–15.2)

## 2019-10-14 LAB — POCT I-STAT EG7
Acid-base deficit: 3 mmol/L — ABNORMAL HIGH (ref 0.0–2.0)
Bicarbonate: 20 mmol/L (ref 20.0–28.0)
Calcium, Ion: 1.05 mmol/L — ABNORMAL LOW (ref 1.15–1.40)
HCT: 25 % — ABNORMAL LOW (ref 36.0–46.0)
Hemoglobin: 8.5 g/dL — ABNORMAL LOW (ref 12.0–15.0)
O2 Saturation: 98 %
Potassium: 3.8 mmol/L (ref 3.5–5.1)
Sodium: 138 mmol/L (ref 135–145)
TCO2: 21 mmol/L — ABNORMAL LOW (ref 22–32)
pCO2, Ven: 29.1 mmHg — ABNORMAL LOW (ref 44.0–60.0)
pH, Ven: 7.445 — ABNORMAL HIGH (ref 7.250–7.430)
pO2, Ven: 97 mmHg — ABNORMAL HIGH (ref 32.0–45.0)

## 2019-10-14 LAB — LACTIC ACID, PLASMA: Lactic Acid, Venous: 1.1 mmol/L (ref 0.5–1.9)

## 2019-10-14 LAB — TSH: TSH: 0.398 u[IU]/mL (ref 0.350–4.500)

## 2019-10-14 LAB — T4, FREE: Free T4: 1.7 ng/dL — ABNORMAL HIGH (ref 0.61–1.12)

## 2019-10-14 LAB — SARS CORONAVIRUS 2 BY RT PCR (HOSPITAL ORDER, PERFORMED IN ~~LOC~~ HOSPITAL LAB): SARS Coronavirus 2: NEGATIVE

## 2019-10-14 LAB — APTT: aPTT: 84 seconds — ABNORMAL HIGH (ref 24–36)

## 2019-10-14 MED ORDER — VITAMIN D (ERGOCALCIFEROL) 1.25 MG (50000 UNIT) PO CAPS
50000.0000 [IU] | ORAL_CAPSULE | ORAL | Status: DC
  Administered 2019-10-17 – 2019-10-21 (×2): 50000 [IU] via ORAL
  Filled 2019-10-14 (×3): qty 1

## 2019-10-14 MED ORDER — LACTATED RINGERS IV BOLUS
1000.0000 mL | Freq: Once | INTRAVENOUS | Status: AC
  Administered 2019-10-14: 1000 mL via INTRAVENOUS

## 2019-10-14 MED ORDER — ZAFIRLUKAST 20 MG PO TABS
20.0000 mg | ORAL_TABLET | Freq: Two times a day (BID) | ORAL | Status: DC
  Filled 2019-10-14 (×3): qty 1

## 2019-10-14 MED ORDER — DICLOFENAC SODIUM 1 % EX GEL
2.0000 g | Freq: Once | CUTANEOUS | Status: AC
  Administered 2019-10-14: 2 g via TOPICAL
  Filled 2019-10-14: qty 100

## 2019-10-14 MED ORDER — SODIUM CHLORIDE 0.9 % IV SOLN
2.0000 g | INTRAVENOUS | Status: DC
  Administered 2019-10-14 – 2019-10-16 (×3): 2 g via INTRAVENOUS
  Filled 2019-10-14 (×4): qty 2

## 2019-10-14 MED ORDER — TRAMADOL HCL 50 MG PO TABS
50.0000 mg | ORAL_TABLET | Freq: Once | ORAL | Status: AC
  Administered 2019-10-14: 50 mg via ORAL
  Filled 2019-10-14: qty 1

## 2019-10-14 MED ORDER — ACETAMINOPHEN 650 MG RE SUPP: 650.0000 mg | Freq: Four times a day (QID) | RECTAL | Status: DC | PRN

## 2019-10-14 MED ORDER — ACETAMINOPHEN 325 MG PO TABS
650.0000 mg | ORAL_TABLET | Freq: Four times a day (QID) | ORAL | Status: DC | PRN
  Administered 2019-10-16 – 2019-10-17 (×2): 650 mg via ORAL
  Filled 2019-10-14 (×2): qty 2

## 2019-10-14 MED ORDER — CALCITRIOL 0.25 MCG PO CAPS
2.0000 ug | ORAL_CAPSULE | Freq: Every day | ORAL | Status: DC
  Administered 2019-10-15 – 2019-10-22 (×7): 2 ug via ORAL
  Filled 2019-10-14 (×9): qty 8

## 2019-10-14 MED ORDER — MOMETASONE FURO-FORMOTEROL FUM 200-5 MCG/ACT IN AERO
2.0000 | INHALATION_SPRAY | Freq: Two times a day (BID) | RESPIRATORY_TRACT | Status: DC
  Administered 2019-10-15 – 2019-10-22 (×15): 2 via RESPIRATORY_TRACT
  Filled 2019-10-14: qty 8.8

## 2019-10-14 MED ORDER — LORATADINE 10 MG PO TABS
10.0000 mg | ORAL_TABLET | ORAL | Status: DC
  Administered 2019-10-15 – 2019-10-22 (×8): 10 mg via ORAL
  Filled 2019-10-14 (×8): qty 1

## 2019-10-14 MED ORDER — MIRABEGRON ER 25 MG PO TB24
50.0000 mg | ORAL_TABLET | Freq: Every day | ORAL | Status: DC
  Administered 2019-10-15 – 2019-10-22 (×7): 50 mg via ORAL
  Filled 2019-10-14 (×8): qty 2

## 2019-10-14 MED ORDER — POLYETHYLENE GLYCOL 3350 17 G PO PACK
17.0000 g | PACK | Freq: Every day | ORAL | Status: DC
  Administered 2019-10-15 – 2019-10-22 (×7): 17 g via ORAL
  Filled 2019-10-14 (×7): qty 1

## 2019-10-14 MED ORDER — LEVOTHYROXINE SODIUM 100 MCG PO TABS
400.0000 ug | ORAL_TABLET | Freq: Every day | ORAL | Status: DC
  Administered 2019-10-15 – 2019-10-22 (×8): 400 ug via ORAL
  Filled 2019-10-14 (×9): qty 4

## 2019-10-14 MED ORDER — TRAMADOL HCL 50 MG PO TABS
50.0000 mg | ORAL_TABLET | Freq: Four times a day (QID) | ORAL | Status: DC | PRN
  Administered 2019-10-15 – 2019-10-21 (×9): 50 mg via ORAL
  Filled 2019-10-14 (×10): qty 1

## 2019-10-14 MED ORDER — PANTOPRAZOLE SODIUM 40 MG PO TBEC
40.0000 mg | DELAYED_RELEASE_TABLET | Freq: Two times a day (BID) | ORAL | Status: DC
  Administered 2019-10-15 – 2019-10-22 (×15): 40 mg via ORAL
  Filled 2019-10-14 (×16): qty 1

## 2019-10-14 MED ORDER — DICLOFENAC SODIUM 1 % EX GEL
2.0000 g | Freq: Four times a day (QID) | CUTANEOUS | Status: DC
  Administered 2019-10-15 – 2019-10-22 (×27): 2 g via TOPICAL
  Filled 2019-10-14: qty 100

## 2019-10-14 MED ORDER — ALBUTEROL SULFATE (2.5 MG/3ML) 0.083% IN NEBU: 2.5000 mg | INHALATION_SOLUTION | Freq: Four times a day (QID) | RESPIRATORY_TRACT | Status: DC | PRN

## 2019-10-14 NOTE — H&P (Addendum)
History and Physical    [name] [id number] DOB: [birth year] DOA: 10/14/2019  PCP: Debbrah Alar, NP Patient coming from: Home  Chief Complaint: Generalized weakness, altered mental status  HPI: [name] is a 64 y.o. female with medical history significant of paroxysmal A. fib, chronic diastolic CHF, COPD, history of thyroidectomy on Synthroid, GERD, hypertension, asthma, anxiety, depression, morbid obesity (BMI 41.19) presenting to the ED via EMS for evaluation of generalized weakness and altered mental status.  Patient is currently AAO x2 but appears confused and is not able to give much history.  She is complaining of pain in her right knee.  Denies chest pain or shortness of breath.  History provided by patient's brother and sister-in-law at bedside.  It is reported that for the past 4 days the patient has had generalized weakness and has not been able to ambulate.  She is requiring assistance all the time.  Today when family tried to transfer the patient from the car to her wheelchair, she slid down due to severe weakness.  Family was able to lower her to the ground with a gait belt.  She did injure her right knee and has been complaining of pain since then.  Family has not noticed any new cough or signs of respiratory distress.  Patient has complained of dysuria and has had urinary incontinence for the past few days.  Today she appeared very confused and was saying things such as she is seeing bugs.  This is very unusual as she is normally AAO x4.  Family has also noticed significant bilateral lower extremity edema and abdominal distention which have been getting progressively worse.  She takes Lasix 40 mg twice daily.  She has not been eating much for the past few days.  Patient has also complained of choking to family when eating dry items such as toast.  She has not had any difficulty swallowing pills with water in the ED.  ED Course: Hypothermic with temperature 93.1 F,  placed on Bair hugger.  Hypotensive with systolic in the [id number] to 0000000.  Not tachycardic, tachypneic, or hypoxic.  Labs showing no leukocytosis.  Lactic acid normal.  Hemoglobin 7.8, ranging between 8.9-9.4 on labs done over a month ago.  MCV 94.  Platelet count 43K, normal on labs done over a month ago.  INR 2.2.  Bicarb 18, anion gap 18.  BUN 70, creatinine 2.0.  Baseline creatinine 1.4.  T bili mildly elevated at 1.4, remainder of LFTs normal.  UA with positive nitrite, moderate leukocytes, greater than 50 RBCs, greater than 50 WBCs, and many bacteria.  Urine culture pending.  Blood culture x2 pending.  Free T4 1.7.  TSH normal.  SARS-CoV-2 PCR test negative.  Chest x-ray showing no active disease.  Head CT negative for acute intracranial abnormality.  CT renal stone study showing bilateral nonobstructing nephrolithiasis.  Mild prominence of the left renal pelvis with perinephric edema, similar to prior exam, may be chronic or seen with UTI.  The possibility of recently passed stone is also considered.  CT also showing large volume stool throughout the colon concerning for constipation.  No bowel obstruction or inflammation.  Liver with nodular hepatic contour, can be seen with cirrhosis.  Mild splenomegaly, unchanged.  Patient was given cefepime, 2 L LR boluses, Voltaren gel, and tramadol.   Review of Systems:  All systems reviewed and apart from history of presenting illness, are negative.  Past Medical History:  Diagnosis Date  . Abnormal Pap smear  years ago/no biopsy  . Allergic rhinitis   . Aortic atherosclerosis   . Arthritis    back- lower  . Asthma   . Atrial fibrillation 12/2010   OFF XARELTO LAST MONTH DUE TO BLEEDING IN STOOL  . Bursitis of hip left  . Chronic anticoagulation 12/16/2018   CHADS VASC=2 for sex and H/O HTN- she is on Eliquis  . Chronic diastolic (congestive) heart failure    pt unaware of this  . COPD (chronic obstructive pulmonary disease) with chronic  bronchitis   . Decreased pulses in feet   . Dysrhythmia    afib, followed by Dr. Stanford Breed   . Edema of both legs   . Endometrial adenocarcinoma 02/21/2013   S/p D and C, has mirena, being followed by GYN   . Esophagitis 02/02/2014   Distal, linear erosions, noted on endoscopy  . Essential hypertension 01/05/2007   Echo Nov June 2020- EF 50-55%, mild LVH, normal LA   . Fatty liver 01/07/2012  . Fibroid 1974   fibroid cyst on left fallopian tube  . Fibromyalgia   . Foot ulcer    AREA HEALED RIGHT FOOT  . Generalized anxiety disorder   . GERD (gastroesophageal reflux disease)   . Headache    occasional sinus headache   . Hepatomegaly   . History of blood transfusion JULY 2015  . History of cardiomegaly 12/06/2010   Noted on CT  . History of E. coli septicemia   . History of papillary thyroid carcinoma 04/07/2011  . Hypocalcemia 12/10/2016  . Hypokalemia 12/10/2016  . Hypoparathyroidism 01/07/2011  . Hypothyroidism   . Iron deficiency anemia secondary to malabsorption  05/10/2007   Qualifier: Diagnosis of  By: Wynona Luna    . Kidney stone Oct 06, 2015   passed on their own  . Leukocytosis 03/24/2009   cta cheQualifier: Diagnosis of  By: Wynona Luna    . Major depressive disorder   . Mild neurocognitive disorder 06/28/2019  . Morbid obesity   . MRSA infection   . Nephrolithiasis 2/16, 9/16   SEES DR Risa Grill  . Osteomyelitis 06/14/2014  . PAF (paroxysmal atrial fibrillation) 02/05/2011   Documented 2012- NSR since.  On chronic anticoagulation.  . Pernicious anemia 02/20/2014   followed by Debbrah Alar  . Pneumonia   . PONV (postoperative nausea and vomiting)   . Pyelonephritis 11/24/2018  . Rash    thighs and back  . Seizures    infancy secondary to fever  . Staph aureus infection   . Thoracic spondylosis 12/06/2010   Noted on CT  . Uterine cancer 2014   Mirena IUD  . UTI (urinary tract infection) 12/13/2018  . Vitamin B 12 deficiency   . Vitamin D  deficiency 05/10/2007   Qualifier: Diagnosis of  By: Wynona Luna     Past Surgical History:  Procedure Laterality Date  . AMPUTATION Right 05/18/2014   Procedure: RIGHT FIFTH RAY AMPUTATION FOOT;  Surgeon: Wylene Simmer, MD;  Location: [place];  Service: Orthopedics;  Laterality: Right;  . APPENDECTOMY    . BUNIONECTOMY     bilateral  . COLONOSCOPY    . COLONOSCOPY WITH PROPOFOL N/A 02/02/2014   Procedure: COLONOSCOPY WITH PROPOFOL;  Surgeon: Irene Shipper, MD;  Location: [facility];  Service: Endoscopy;  Laterality: N/A;  . cyst on ovary removed     . CYSTOSCOPY W/ RETROGRADES  03/03/2012   Procedure: CYSTOSCOPY WITH RETROGRADE PYELOGRAM;  Surgeon: Bernestine Amass, MD;  Location:  [facility];  Service: Urology;  Laterality: Bilateral;  . CYSTOSCOPY W/ URETERAL STENT PLACEMENT Right 11/25/2017   Procedure: CYSTOSCOPY WITH RETROGRADE RIGHT URETERAL STENT PLACEMENT;  Surgeon: Franchot Gallo, MD;  Location: [place];  Service: Urology;  Laterality: Right;  . CYSTOSCOPY W/ URETERAL STENT PLACEMENT Right 01/15/2018   Procedure: RIGHT STENT EXCHANGE;  Surgeon: Ardis Hughs, MD;  Location: [facility];  Service: Urology;  Laterality: Right;  . CYSTOSCOPY WITH RETROGRADE PYELOGRAM, URETEROSCOPY AND STENT PLACEMENT Left 08/25/2012   Procedure: CYSTOSCOPY WITH RETROGRADE PYELOGRAM, URETEROSCOPY;  Surgeon: Bernestine Amass, MD;  Location: [facility];  Service: Urology;  Laterality: Left;  . CYSTOSCOPY WITH STENT PLACEMENT Left 01/15/2018   Procedure: LEFT STENT PLACEMENT;  Surgeon: Ardis Hughs, MD;  Location: [facility];  Service: Urology;  Laterality: Left;  . CYSTOSCOPY WITH STENT PLACEMENT Right 01/22/2018   Procedure: RIGHT STENT REMOVAL;  Surgeon: Ardis Hughs, MD;  Location: [facility];  Service: Urology;  Laterality: Right;  . CYSTOSCOPY/URETEROSCOPY/HOLMIUM LASER/STENT PLACEMENT Right 12/23/2017   Procedure: RIGHT URETEROSCOPY STONE REMOVAL, HOLMIUM LASER, RIGHT /STENT EXCHANGE;  Surgeon: Ardis Hughs, MD;  Location: [facility];  Service: Urology;  Laterality: Right;  . CYSTOSCOPY/URETEROSCOPY/HOLMIUM LASER/STENT PLACEMENT Left 01/22/2018   Procedure: LEFT URETEROSCOPY STONE REMOVAL HOLMIUM LASER LEFT STENT Freddi Starr;  Surgeon: Ardis Hughs, MD;  Location: [facility];  Service: Urology;  Laterality: Left;  . DILATION AND CURETTAGE OF UTERUS N/A 02/21/2013   Procedure: DILATATION AND CURETTAGE;  Surgeon: Lyman Speller, MD;  Location: [facility];  Service: Gynecology;  Laterality: N/A;  . DILATION AND CURETTAGE OF UTERUS N/A 04/09/2015   Procedure: [place] IUD removal;  Surgeon: Megan Salon, MD;  Location: [facility];  Service: Gynecology;  Laterality: N/A;  Patient weight 307lbs  . ESOPHAGOGASTRODUODENOSCOPY (EGD) WITH PROPOFOL N/A 02/02/2014   Procedure: ESOPHAGOGASTRODUODENOSCOPY (EGD) WITH PROPOFOL;  Surgeon: Irene Shipper, MD;  Location: [facility];  Service: Endoscopy;  Laterality: N/A;  . GASTRIC BYPASS  1974  . HOLMIUM LASER APPLICATION Left 0000000   Procedure: HOLMIUM LASER APPLICATION;  Surgeon: Bernestine Amass, MD;  Location: [facility];  Service: Urology;  Laterality: Left;  . LITHOTRIPSY  03/2012  . LITHOTRIPSY  2/16  . THYROIDECTOMY  05/15/2010  . TONSILLECTOMY AND ADENOIDECTOMY    . UPPER GASTROINTESTINAL ENDOSCOPY    . URETEROSCOPY  03/03/2012   Procedure: URETEROSCOPY;  Surgeon: Bernestine Amass, MD;  Location: [facility];  Service: Urology;  Laterality: Left;  . URETEROSCOPY WITH HOLMIUM LASER LITHOTRIPSY Bilateral 01/15/2018   Procedure: CYSTOSCOPY/BILATERAL URETEROSCOPY WITH HOLMIUM LASER LITHOTRIPSY STONE REMOVAL;  Surgeon: Ardis Hughs, MD;  Location: [facility];  Service: Urology;  Laterality: Bilateral;     reports that she quit smoking about 24 years ago. Her smoking use included cigarettes. She started smoking about 48 years ago. She has a 12.50 pack-year smoking history. She has never used smokeless tobacco. She reports  current alcohol use. She reports that she does not use drugs.  Allergies  Allergen Reactions  . Nutritional Supplements Anaphylaxis  . Other Anaphylaxis    Reaction to tree nuts  . Amoxicillin-Pot Clavulanate Other (See Comments)    headache  . Ciprofloxacin Nausea Only and Rash  . Food Hives and Other (See Comments)    Potato  . Keflex [Cephalexin] Rash  . Penicillins Rash    Headache. And hives - Dose not tolerate Cephalosporin either   Has patient had a PCN reaction causing immediate rash,  facial/tongue/throat swelling, SOB or lightheadedness with hypotension: No Has patient had a PCN reaction causing severe rash involving mucus membranes or skin necrosis: No Has patient had a PCN reaction that required hospitalization: No Has patient had a PCN reaction occurring within the last 10 years: Yes (reaction was May 2020) If all of the above answers are "NO", then may proce  . Rocephin [Ceftriaxone] Rash  . Tomato Hives    Family History  Problem Relation Age of Onset  . Hypertension Father   . Diabetes Father   . Lung cancer Father   . Heart attack Father        MI at age 23  . Hypertension Mother   . Hyperthyroidism Mother   . Heart disease Mother   . Heart attack Mother        MI at age 53  . Asthma Brother   . Hypertension Brother        younger  . Heart disease Brother        older  . Dementia Paternal Aunt        Unspecified type; symptoms emerged with advanced age  . Colon cancer Neg Hx   . Esophageal cancer Neg Hx   . Stomach cancer Neg Hx   . Kidney disease Neg Hx   . Liver disease Neg Hx   . Pancreatic cancer Neg Hx     Prior to Admission medications   Medication Sig Start Date End Date Taking? Authorizing Provider  acetaminophen (TYLENOL) 325 MG tablet Take 2 tablets (650 mg total) by mouth every 6 (six) hours as needed for mild pain or moderate pain. 12/08/16  Yes Waynetta Pean, PA-C  albuterol (PROVENTIL HFA;VENTOLIN HFA) 108 (90 Base) MCG/ACT inhaler  Inhale 2 puffs into the lungs every 6 (six) hours as needed for wheezing or shortness of breath. 11/10/16  Yes Debbrah Alar, NP  albuterol (PROVENTIL) (2.5 MG/3ML) 0.083% nebulizer solution Take 3 mLs (2.5 mg total) by nebulization every 6 (six) hours as needed for wheezing or shortness of breath. 01/29/18  Yes Debbrah Alar, NP  apixaban (ELIQUIS) 5 MG TABS tablet Take 1 tablet (5 mg total) by mouth 2 (two) times daily. 09/19/19  Yes Lelon Perla, MD  calcitRIOL (ROCALTROL) 0.5 MCG capsule Take 2 mcg by mouth daily.    Yes [provider]  CRANBERRY CONCENTRATE PO Take 30,000 mg by mouth daily.   Yes [provider]  cyanocobalamin (,VITAMIN B-12,) 1000 MCG/ML injection ADMINISTER 1ML (1,000 MCG) UNDER THE SKIN EVERY 30 DAYS Patient taking differently: Inject 1,000 mcg into the muscle every 30 (thirty) days.  08/15/19  Yes Debbrah Alar, NP  desoximetasone (TOPICORT) 0.05 % cream Apply topically 2 (two) times daily as needed. Patient taking differently: Apply 1 application topically 2 (two) times daily as needed (irritation).  01/29/17  Yes Debbrah Alar, NP  diclofenac sodium (VOLTAREN) 1 % GEL Apply 2 g topically 4 (four) times daily. Patient taking differently: Apply 2 g topically 4 (four) times daily as needed (pain).  05/05/18  Yes Debbrah Alar, NP  diltiazem (CARDIZEM CD) 120 MG 24 hr capsule Take 1 capsule (120 mg total) by mouth daily. 06/29/19 10/14/19 Yes Debbrah Alar, NP  EPINEPHrine (EPIPEN 2-PAK) 0.3 mg/0.3 mL IJ SOAJ injection Inject 0.3 mg into the muscle once as needed for anaphylaxis (severe allergic reaction).    Yes [provider]  furosemide (LASIX) 40 MG tablet Take 1 tablet (40 mg total) by mouth 2 (two) times daily. Patient taking  differently: Take 20 mg by mouth 2 (two) times daily.  06/17/19  Yes Lelon Perla, MD  hydrocortisone cream 1 % Apply topically 4 (four) times daily as needed for itching. 10/24/18  Yes  Kyle, Tyrone A, DO  levocetirizine (XYZAL) 5 MG tablet TAKE 1 TABLET BY MOUTH  EVERY EVENING Patient taking differently: Take 5 mg by mouth every morning.  08/18/18  Yes Debbrah Alar, NP  levothyroxine (SYNTHROID, LEVOTHROID) 100 MCG tablet Take 400 mcg by mouth daily before breakfast.    Yes [provider]  Melatonin 5 MG TABS Take 5 mg by mouth at bedtime.    Yes [provider]  MYRBETRIQ 50 MG TB24 tablet Take 50 mg by mouth daily. 07/25/19  Yes [provider]  nystatin (MYCOSTATIN/NYSTOP) powder Apply topically 2 (two) times daily. Patient taking differently: Apply 1 application topically 2 (two) times daily as needed (irritation).  12/22/18  Yes Debbrah Alar, NP  nystatin cream (MYCOSTATIN) Apply 1 application topically 2 (two) times daily. 01/21/19  Yes Debbrah Alar, NP  omeprazole (PRILOSEC) 40 MG capsule Take 1 capsule (40 mg total) by mouth in the morning and at bedtime. 09/08/19  Yes Levin Erp, PA  ondansetron (ZOFRAN) 4 MG tablet Take 1 tablet (4 mg total) by mouth every 8 (eight) hours as needed for nausea or vomiting. 12/30/17  Yes Debbrah Alar, NP  simethicone (GAS-X) 80 MG chewable tablet Chew 1 tablet by mouth 4 (four) times daily as needed for flatulence.    Yes [provider]  spironolactone (ALDACTONE) 25 MG tablet Take 1 tablet (25 mg total) by mouth daily. 07/20/19  Yes Debbrah Alar, NP  SYMBICORT 160-4.5 MCG/ACT inhaler INHALE 2 PUFFS BY MOUTH TWO TIMES DAILY Patient taking differently: Inhale 2 puffs into the lungs in the morning and at bedtime.  12/16/18  Yes Debbrah Alar, NP  Syringe/Needle, Disp, (SYRINGE 3CC/20GX1") 20G X 1" 3 ML MISC Use monthly as directed Patient taking differently: 1 each by Other route every 30 (thirty) days.  08/04/18  Yes Debbrah Alar, NP  traMADol (ULTRAM) 50 MG tablet TAKE 1 TABLET(50 MG) BY MOUTH EVERY 12 HOURS AS NEEDED Patient taking differently: Take 50  mg by mouth every 12 (twelve) hours as needed for moderate pain. TAKE 1 TABLET(50 MG) BY MOUTH EVERY 12 HOURS AS NEEDED 05/26/19  Yes Debbrah Alar, NP  trimethoprim (TRIMPEX) 100 MG tablet Take 100 mg by mouth daily. 04/29/19  Yes [provider]  Vitamin D, Ergocalciferol, (DRISDOL) 1.25 MG (50000 UNIT) CAPS capsule TAKE 1 CAPSULE BY MOUTH  EVERY [place], [place],  [place]. Patient taking differently: Take 50,000 Units by mouth 3 (three) times a week. TAKE 1 CAPSULE BY MOUTH  EVERY MONDAY, WEDNESDAY,  AND FRIDAY. 09/16/19  Yes Debbrah Alar, NP  zafirlukast (ACCOLATE) 20 MG tablet Take 1 tablet (20 mg total) by mouth 2 (two) times daily before a meal. 07/20/19  Yes Debbrah Alar, NP  DULoxetine (CYMBALTA) 30 MG capsule Take one 30 mg tablet by mouth once a day for one week. Then increase to one 30 mg tablet by mouth TWICE a day and stay at that dose thereafter. Patient not taking: Reported on 10/14/2019 09/23/19   Debbrah Alar, NP    Physical Exam: Vitals:   10/15/19 0000 10/15/19 0015 10/15/19 0111 10/15/19 0405  BP: (!) 106/56  (!) 96/46 (!) 121/56  Pulse: 76  75   Resp: 13  13 18   Temp:  (!) 97.1 F (36.2 C) (!)  96.6 F (35.9 C) (!) 97 F (36.1 C)  TempSrc:  Axillary Axillary Axillary  SpO2: 98%  98% 98%  Weight:   111 kg   Height:   5\' 4"  (1.626 m)     Physical Exam  Constitutional: She appears well-developed and well-nourished. No distress.  HENT:  Head: Normocephalic.  Very dry mucous membranes  Eyes: Right eye exhibits no discharge. Left eye exhibits no discharge.  Cardiovascular: Normal rate, regular rhythm and intact distal pulses.  Pulmonary/Chest: Effort normal and breath sounds normal. No respiratory distress. She has no wheezes. She has no rales.  Abdominal: Soft. Bowel sounds are normal. She exhibits distension. There is no rebound and no guarding.  Mild generalized tenderness  Musculoskeletal:        General: Edema present.      Cervical back: Neck supple.     Comments: Significant +4 pitting edema bilateral lower extremities with edema extending up to the abdomen  Neurological:  Awake and alert Oriented to person and place only Appears confused  Skin: Skin is warm and dry. She is not diaphoretic.    Labs on Admission: I have personally reviewed following labs and imaging studies  CBC: Recent Labs  Lab 10/14/19 1330 10/14/19 1338 10/14/19 2311  WBC 5.9  --  5.2  NEUTROABS 5.3  --   --   HGB 7.8* 8.5* 7.3*  HCT 26.7* 25.0* 25.3*  MCV 94.7  --  95.1  PLT 43*  --  28*   Basic Metabolic Panel: Recent Labs  Lab 10/14/19 1330 10/14/19 1338  NA 141 138  K 4.1 3.8  CL 105  --   CO2 18*  --   GLUCOSE 85  --   BUN 70*  --   CREATININE 2.06*  --   CALCIUM 8.2*  --    GFR: Estimated Creatinine Clearance: 34.5 mL/min (A) (by C-G formula based on SCr of 2.06 mg/dL (H)). Liver Function Tests: Recent Labs  Lab 10/14/19 1330  AST 21  ALT 24  ALKPHOS 125  BILITOT 1.4*  PROT 6.4*  ALBUMIN 2.5*   No results for input(s): LIPASE, AMYLASE in the last 168 hours. No results for input(s): AMMONIA in the last 168 hours. Coagulation Profile: Recent Labs  Lab 10/14/19 1330  INR 2.2*   Cardiac Enzymes: No results for input(s): CKTOTAL, CKMB, CKMBINDEX, TROPONINI in the last 168 hours. BNP (last 3 results) No results for input(s): PROBNP in the last 8760 hours. HbA1C: No results for input(s): HGBA1C in the last 72 hours. CBG: No results for input(s): GLUCAP in the last 168 hours. Lipid Profile: No results for input(s): CHOL, HDL, LDLCALC, TRIG, CHOLHDL, LDLDIRECT in the last 72 hours. Thyroid Function Tests: Recent Labs    10/14/19 1525  TSH 0.398  FREET4 1.70*   Anemia Panel: No results for input(s): VITAMINB12, FOLATE, FERRITIN, TIBC, IRON, RETICCTPCT in the last 72 hours. Urine analysis:    Component Value Date/Time   COLORURINE YELLOW 10/14/2019 1530   APPEARANCEUR CLOUDY (A)  10/14/2019 1530   LABSPEC 1.011 10/14/2019 1530   LABSPEC 1.010 06/09/2007 1500   PHURINE 5.0 10/14/2019 1530   GLUCOSEU NEGATIVE 10/14/2019 1530   GLUCOSEU NEGATIVE 02/16/2018 1202   HGBUR LARGE (A) 10/14/2019 1530   HGBUR negative 05/09/2010 1026   BILIRUBINUR NEGATIVE 10/14/2019 1530   BILIRUBINUR negative 04/06/2019 1434   BILIRUBINUR N 05/01/2016 1104   BILIRUBINUR Negative 06/09/2007 [place] 10/14/2019 1530   PROTEINUR 100 (A) 10/14/2019 1530  UROBILINOGEN 0.2 04/06/2019 1434   UROBILINOGEN 0.2 02/16/2018 1202   NITRITE POSITIVE (A) 10/14/2019 1530   LEUKOCYTESUR MODERATE (A) 10/14/2019 1530   LEUKOCYTESUR Negative 06/09/2007 1500    Radiological Exams on Admission: DG Knee 1-2 Views Right  Result Date: 10/14/2019 CLINICAL DATA:  Knee pain EXAM: RIGHT KNEE - 1-2 VIEW COMPARISON:  06/30/2018 FINDINGS: There are advanced tricompartmental degenerative changes. There is a large joint effusion, new from prior study. There is no definite acute displaced fracture or dislocation. IMPRESSION: 1. Large joint effusion without evidence for an acute displaced fracture or dislocation. If there is high clinical suspicion for an occult fracture, follow-up with cross-sectional imaging is recommended. 2. Advanced tricompartmental degenerative changes are noted. Electronically Signed   By: Constance Holster M.D.   On: 10/14/2019 23:07   CT Head Wo Contrast  Result Date: 10/14/2019 CLINICAL DATA:  Altered mental status.  Increasing weakness. EXAM: CT HEAD WITHOUT CONTRAST TECHNIQUE: Contiguous axial images were obtained from the base of the skull through the vertex without intravenous contrast. COMPARISON:  CT head 11/21/2018.  Cranial MRI 08/12/2019. FINDINGS: Brain: There is no evidence of acute intracranial hemorrhage, mass lesion, brain edema or extra-axial fluid collection. The ventricles and subarachnoid spaces are appropriately sized for age. There is no CT evidence of acute  cortical infarction. Vascular: Mild intracranial vascular calcifications. No hyperdense vessel identified. Skull: Negative for fracture or focal lesion. Calvarial hyperostosis again noted. Sinuses/Orbits: The visualized paranasal sinuses and mastoid air cells are clear. No orbital abnormalities are seen. Other: None. IMPRESSION: Stable head CT. No acute intracranial findings. Electronically Signed   By: Richardean Sale M.D.   On: 10/14/2019 18:56   DG Chest Port 1 View  Result Date: 10/14/2019 CLINICAL DATA:  Weakness EXAM: PORTABLE CHEST 1 VIEW COMPARISON:  12/12/2018 FINDINGS: Cardiac shadow is stable. Mild aortic calcifications are seen. The lungs are clear bilaterally. No bony abnormality is seen. IMPRESSION: No acute abnormality noted. Electronically Signed   By: Inez Catalina M.D.   On: 10/14/2019 13:10   CT Renal Stone Study  Result Date: 10/14/2019 CLINICAL DATA:  Increasing weakness and altered mental status. Flank pain, kidney stone suspected EXAM: CT ABDOMEN AND PELVIS WITHOUT CONTRAST TECHNIQUE: Multidetector CT imaging of the abdomen and pelvis was performed following the standard protocol without IV contrast. COMPARISON:  Abdominal CT 12/12/2018 FINDINGS: Lower chest: Minor dependent atelectasis. No pleural effusion. Coronary artery calcifications. Normal heart size. Hepatobiliary: Previous low-density attenuation throughout the liver is no longer seen. No evidence of focal hepatic lesion. Liver contour somewhat nodular, also seen on prior. No calcified gallstone or pericholecystic inflammation. For gene cap in the gallbladder. No biliary dilatation. Pancreas: Parenchymal atrophy. No ductal dilatation or inflammation. Spleen: Again seen splenomegaly, spanning 13.8 cm cranial caudal. No focal abnormality. Adrenals/Urinary Tract: No adrenal nodule. Fat stranding about the left kidney, also seen on prior exam. Mild prominence of the left renal pelvis. There are multiple nonobstructing stones within  the left kidney but no obstructing or ureteral calculus. Multiple nonobstructing stones throughout the right kidney. No right hydronephrosis. No right ureteral calculi. No right perinephric stranding. Urinary bladder is distended without wall thickening or bladder stone. No urethral stones visualized. Stomach/Bowel: Colonic tortuosity. Large volume of stool throughout the colon. No colonic wall thickening. No pericolonic inflammation. No obstruction. Appendix not definitively visualized. No evidence of appendicitis. There is no small bowel obstruction or inflammation. Stomach is decompressed. Vascular/Lymphatic: Moderate aortic atherosclerosis. Bi-iliac atherosclerosis. Coarse calcification causes luminal narrowing of the left  common iliac artery. No adenopathy. Reproductive: IUD in the uterus, unusual for age. No adnexal mass. Other: No free air, free fluid, or intra-abdominal fluid collection. Musculoskeletal: Diffuse degenerative change throughout the spine. Scattered bone islands in the pelvis. There are no acute or suspicious osseous abnormalities. IMPRESSION: 1. Bilateral nonobstructing nephrolithiasis. Mild prominence of the left renal pelvis with perinephric edema, similar to prior exam, may be chronic or seen with urinary tract infection. The possibility of recently passed stone is also considered. 2. Large volume of stool throughout the colon, can be seen with constipation. No bowel obstruction or inflammation. 3. Nodular hepatic contour, can be seen with cirrhosis. Mild splenomegaly, unchanged. Aortic Atherosclerosis (ICD10-I70.0). Electronically Signed   By: Keith Rake M.D.   On: 10/14/2019 19:01    EKG: Independently reviewed.  Regular rhythm, heart rate 64.  Interpretation limited secondary artifact.  Repeat EKG pending.  Assessment/Plan Principal Problem:   UTI (urinary tract infection) Active Problems:   Sepsis ([place])   Hypothermia   Thrombocytopenia ([facility])   AKI (acute kidney injury)  ([place])   Sepsis secondary to UTI: Hypothermic on arrival and was initially placed on Bair hugger.  Temperature has now improved and is no longer requiring Retail banker.  Hypotensive on arrival with systolic in the [id number] to 0000000.  Blood pressure improved after 2 L IV fluid boluses.  Labs showing no leukocytosis.  Lactic acid normal.  UA with positive nitrite, moderate leukocytes, greater than 50 RBCs, greater than 50 WBCs, and many bacteria.  -Continue cefepime.  Urine culture pending.  Blood culture x2 pending.  Thrombocytopenia: This appears new compared to prior labs.  Platelet count 43k on initial labs, repeat 28K.  Sepsis could possibly be contributing.  No signs of active bleeding. -Hold home Eliquis.  Order peripheral blood smear and check immature platelet fraction.  Order HIT antibody and DIC panel.  Continue to monitor CBC.  AKI: BUN 70, creatinine 2.0.  Creatinine was 1.4 on 08/29/2019.  Likely prerenal from sepsis/hypotension/dehydration and home diuretic use. -Received 2 L IV fluid boluses, will hold off giving additional IV fluid at this time given significant peripheral edema.  Repeat BMP in a.m.  Avoid nephrotoxic agents.  Monitor urine output.  Hold home Lasix and spironolactone.  Hypothermia: Suspect related to infection.  TSH normal.  Initially placed on Bair hugger and temperature has now improved.  Check cortisol level.  Acute metabolic encephalopathy: Suspect related to UTI.  Patient is AAO x3 but appears confused.  Head CT negative for acute intracranial abnormality. -Continue management of UTI as mentioned above  Right knee pain: Knee pain started after her fall.  X-ray showing large joint effusion without evidence of an acute displaced fracture or dislocation.  -Continue pain management with Tylenol, Voltaren gel, and Tylenol as needed.  Will need arthrocentesis but platelet count currently low, consult orthopedics in a.m.  Generalized weakness: Likely related to UTI.  PT and OT  consult.  Volume overload/diastolic CHF: Patient has significant peripheral edema but lungs clear on imaging.  No signs of respiratory distress.   -Check BNP.  Will hold off giving Lasix at this time in setting of sepsis.  Dysphagia: Keep n.p.o. SLP eval  Paroxysmal A. Fib: Rate controlled.  Hold home Cardizem in the setting of sepsis. Addendum: Eliquis was also held due to severe thrombocytopenia.  Hypothyroidism: TSH normal.  Continue home Synthroid.  Normal anion gap metabolic acidosis: Bicarb 18, anion gap 18.  Likely related to home diuretic use. -Received IV fluid  hydration.  Hold diuretic at this time.  Repeat BMP in a.m.  Constipation: CT also showing large volume stool throughout the colon concerning for constipation.  No bowel obstruction or inflammation.   -Bowel regimen  DVT prophylaxis: SCDs given thrombocytopenia Code Status: Patient is currently confused.  CODE STATUS was discussed with family who wants the patient to be full code. Family Communication: Patient's brother and sister-in-law at bedside. Disposition Plan: Status is: Inpatient  Remains inpatient appropriate because:Inpatient level of care appropriate due to severity of illness   Dispo: The patient is from: Home              Anticipated d/c is to: SNF              Anticipated d/c date is: 3 days              Patient currently is not medically stable to d/c.  The medical decision making on this patient was of high complexity and the patient is at high risk for clinical deterioration, therefore this is a level 3 visit.  Shela Leff MD [facility]  If 7PM-7AM, please contact night-coverage [email or web address]  10/15/2019, 4:41 AM

## 2019-10-14 NOTE — Progress Notes (Signed)
Pharmacy Antibiotic Note  [name] is a 64 y.o. female admitted on 10/14/2019 with UTI.  Pharmacy has been consulted for cefepime dosing.  Plan: Cefepime 2 g IV every 24 hours Monitor clinical status and renal function Follow-up cultures and narrow if needed     Temp (24hrs), Avg:93.1 F (33.9 C), Min:93.1 F (33.9 C), Max:93.1 F (33.9 C)  Recent Labs  Lab 10/14/19 1330  WBC 5.9  CREATININE 2.06*  LATICACIDVEN 1.1    Estimated Creatinine Clearance: 34.2 mL/min (A) (by C-G formula based on SCr of 2.06 mg/dL (H)).    Allergies  Allergen Reactions  . Nutritional Supplements Anaphylaxis  . Other Anaphylaxis    Reaction to tree nuts  . Amoxicillin-Pot Clavulanate Other (See Comments)    headache  . Ciprofloxacin Nausea Only and Rash  . Food Hives and Other (See Comments)    Potato  . Keflex [Cephalexin] Rash  . Penicillins Rash    Headache. And hives - Dose not tolerate Cephalosporin either   Has patient had a PCN reaction causing immediate rash, facial/tongue/throat swelling, SOB or lightheadedness with hypotension: No Has patient had a PCN reaction causing severe rash involving mucus membranes or skin necrosis: No Has patient had a PCN reaction that required hospitalization: No Has patient had a PCN reaction occurring within the last 10 years: Yes (reaction was May 2020) If all of the above answers are "NO", then may proce  . Rocephin [Ceftriaxone] Rash  . Tomato Hives    Antimicrobials this admission: Cefepime 5/21 >>  Microbiology results: 5/21 UCx: sent 5/21 BCx x2: sent     Scarlet Abad L. Devin Going, [place] PGY1 Pharmacy Resident [phone] 10/14/19      3:15 PM  Please check AMION for all [place] phone numbers After 10:00 PM, call the [facility] [phone]

## 2019-10-14 NOTE — ED Provider Notes (Signed)
[facility] EMERGENCY DEPARTMENT Provider Note   CSN: [id number] Arrival date & time: 10/14/19  1222  LEVEL 5 CAVEAT - ALTERED MENTAL STATUS   History Chief Complaint  Patient presents with  . Altered Mental Status  . Weakness  . Fall    [name] is a 64 y.o. female.  HPI 64 year old female brought in for weakness and altered mental status.  The patient cannot provide much of a history though she states she passed out while standing.  However the brother states that family told him that the patient fell because of weakness when trying to transfer to see the doctor in the parking lot from the wheelchair.  No obvious head injury.  However she has been confused for the last few days.  They have also noted some swelling in her legs.  No fevers.  History is otherwise limited because of the patient's confusion.   Past Medical History:  Diagnosis Date  . Abnormal Pap smear    years ago/no biopsy  . Allergic rhinitis   . Aortic atherosclerosis   . Arthritis    back- lower  . Asthma   . Atrial fibrillation 12/2010   OFF XARELTO LAST MONTH DUE TO BLEEDING IN STOOL  . Bursitis of hip left  . Chronic anticoagulation 12/16/2018   CHADS VASC=2 for sex and H/O HTN- she is on Eliquis  . Chronic diastolic (congestive) heart failure    pt unaware of this  . COPD (chronic obstructive pulmonary disease) with chronic bronchitis   . Decreased pulses in feet   . Dysrhythmia    afib, followed by Dr. Stanford Breed   . Edema of both legs   . Endometrial adenocarcinoma 02/21/2013   S/p D and C, has mirena, being followed by GYN   . Esophagitis 02/02/2014   Distal, linear erosions, noted on endoscopy  . Essential hypertension 01/05/2007   Echo Nov June 2020- EF 50-55%, mild LVH, normal LA   . Fatty liver 01/07/2012  . Fibroid 1974   fibroid cyst on left fallopian tube  . Fibromyalgia   . Foot ulcer    AREA HEALED RIGHT FOOT  . Generalized anxiety disorder   . GERD  (gastroesophageal reflux disease)   . Headache    occasional sinus headache   . Hepatomegaly   . History of blood transfusion JULY 2015  . History of cardiomegaly 12/06/2010   Noted on CT  . History of E. coli septicemia   . History of papillary thyroid carcinoma 04/07/2011  . Hypocalcemia 12/10/2016  . Hypokalemia 12/10/2016  . Hypoparathyroidism 01/07/2011  . Hypothyroidism   . Iron deficiency anemia secondary to malabsorption  05/10/2007   Qualifier: Diagnosis of  By: Wynona Luna    . Kidney stone 09-19-2015   passed on their own  . Leukocytosis 03/24/2009   cta cheQualifier: Diagnosis of  By: Wynona Luna    . Major depressive disorder   . Mild neurocognitive disorder 06/28/2019  . Morbid obesity   . MRSA infection   . Nephrolithiasis 2/16, 9/16   SEES DR Risa Grill  . Osteomyelitis 06/14/2014  . PAF (paroxysmal atrial fibrillation) 02/05/2011   Documented 2012- NSR since.  On chronic anticoagulation.  . Pernicious anemia 02/20/2014   followed by Debbrah Alar  . Pneumonia   . PONV (postoperative nausea and vomiting)   . Pyelonephritis 11/24/2018  . Rash    thighs and back  . Seizures    infancy secondary to fever  .  Staph aureus infection   . Thoracic spondylosis 12/06/2010   Noted on CT  . Uterine cancer 2014   Mirena IUD  . UTI (urinary tract infection) 12/13/2018  . Vitamin B 12 deficiency   . Vitamin D deficiency 05/10/2007   Qualifier: Diagnosis of  By: Wynona Luna     Patient Active Problem List   Diagnosis Date Noted  . Mild neurocognitive disorder 06/28/2019  . Major depressive disorder   . Generalized anxiety disorder   . Chronic anticoagulation 12/16/2018  . Abnormal nuclear stress test 12/16/2018  . Debilitated patient 12/16/2018  . Pressure injury of skin 12/13/2018  . UTI (urinary tract infection) 12/13/2018  . Pyelonephritis 11/21/2018  . Sepsis due to Escherichia coli (E. coli) ([place]) 11/28/2017  . E coli bacteremia 11/28/2017  .  Sepsis ([place]) 11/24/2017  . Hypokalemia 12/10/2016  . Hypocalcemia 12/10/2016  . Recurrent falls 12/09/2016  . Right ureteral stone 07/17/2014  . Osteomyelitis ([place]) 06/14/2014  . Pernicious anemia 02/20/2014  . Reflux esophagitis 02/02/2014  . Edema 11/23/2013  . Foot pain 03/31/2013  . Endometrial adenocarcinoma ([place]) 02/21/2013  . Trochanteric bursitis of left hip 09/13/2012  . Muscle cramps 08/12/2012  . Fatty liver 01/07/2012  . Mid back pain 09/30/2011  . Weight gain 07/02/2011  . Nonspecific abnormal unspecified cardiovascular function study 04/30/2011  . Hx of papillary thyroid carcinoma 04/07/2011  . Low back pain 04/01/2011  . PAF (paroxysmal atrial fibrillation) 02/05/2011  . Hypoparathyroidism ([place]) 01/07/2011  . GERD (gastroesophageal reflux disease) 05/22/2009  . Leukocytosis 03/24/2009  . Morbid obesity ([place]) 12/20/2008  . Rhinosinusitis, recurrent 09/07/2008  . Vitamin D deficiency 05/10/2007  . Iron deficiency anemia secondary to malabsorption  05/10/2007  . Hypothyroidism 01/05/2007  . Essential hypertension 01/05/2007  . Asthma 01/05/2007    Past Surgical History:  Procedure Laterality Date  . AMPUTATION Right 05/18/2014   Procedure: RIGHT FIFTH RAY AMPUTATION FOOT;  Surgeon: Wylene Simmer, MD;  Location: [facility];  Service: Orthopedics;  Laterality: Right;  . APPENDECTOMY    . BUNIONECTOMY     bilateral  . COLONOSCOPY    . COLONOSCOPY WITH PROPOFOL N/A 02/02/2014   Procedure: COLONOSCOPY WITH PROPOFOL;  Surgeon: Irene Shipper, MD;  Location: [facility];  Service: Endoscopy;  Laterality: N/A;  . cyst on ovary removed     . CYSTOSCOPY W/ RETROGRADES  03/03/2012   Procedure: CYSTOSCOPY WITH RETROGRADE PYELOGRAM;  Surgeon: Bernestine Amass, MD;  Location: [facility];  Service: Urology;  Laterality: Bilateral;  . CYSTOSCOPY W/ URETERAL STENT PLACEMENT Right 11/25/2017   Procedure: CYSTOSCOPY WITH RETROGRADE RIGHT URETERAL STENT PLACEMENT;  Surgeon: Franchot Gallo,  MD;  Location: [facility];  Service: Urology;  Laterality: Right;  . CYSTOSCOPY W/ URETERAL STENT PLACEMENT Right 01/15/2018   Procedure: RIGHT STENT EXCHANGE;  Surgeon: Ardis Hughs, MD;  Location: [facility];  Service: Urology;  Laterality: Right;  . CYSTOSCOPY WITH RETROGRADE PYELOGRAM, URETEROSCOPY AND STENT PLACEMENT Left 08/25/2012   Procedure: CYSTOSCOPY WITH RETROGRADE PYELOGRAM, URETEROSCOPY;  Surgeon: Bernestine Amass, MD;  [facility];  Service: Urology;  Laterality: Left;  . CYSTOSCOPY WITH STENT PLACEMENT Left 01/15/2018   Procedure: LEFT STENT PLACEMENT;  Surgeon: Ardis Hughs, MD;  [facility];  Service: Urology;  Laterality: Left;  . CYSTOSCOPY WITH STENT PLACEMENT Right 01/22/2018   Procedure: RIGHT STENT REMOVAL;  Surgeon: Ardis Hughs, MD;  [facility];  Service: Urology;  Laterality: Right;  . CYSTOSCOPY/URETEROSCOPY/HOLMIUM LASER/STENT PLACEMENT Right 12/23/2017  Procedure: RIGHT URETEROSCOPY STONE REMOVAL, HOLMIUM LASER, RIGHT /STENT EXCHANGE;  Surgeon: Ardis Hughs, MD;  [facility];  Service: Urology;  Laterality: Right;  . CYSTOSCOPY/URETEROSCOPY/HOLMIUM LASER/STENT PLACEMENT Left 01/22/2018   Procedure: LEFT URETEROSCOPY STONE REMOVAL HOLMIUM LASER LEFT STENT Freddi Starr;  Surgeon: Ardis Hughs, MD;  [facility];  Service: Urology;  Laterality: Left;  . DILATION AND CURETTAGE OF UTERUS N/A 02/21/2013   Procedure: DILATATION AND CURETTAGE;  Surgeon: Lyman Speller, MD;  Location: [facility];  Service: Gynecology;  Laterality: N/A;  . DILATION AND CURETTAGE OF UTERUS N/A 04/09/2015   Procedure: [place] IUD removal;  Surgeon: Megan Salon, MD;  Location: [facility];  Service: Gynecology;  Laterality: N/A;  Patient weight 307lbs  . ESOPHAGOGASTRODUODENOSCOPY (EGD) WITH PROPOFOL N/A 02/02/2014   Procedure: ESOPHAGOGASTRODUODENOSCOPY (EGD) WITH PROPOFOL;  Surgeon: Irene Shipper, MD;  [facility];  Service: Endoscopy;  Laterality: N/A;  . GASTRIC BYPASS  1974  . HOLMIUM LASER APPLICATION Left 0000000   Procedure: HOLMIUM LASER APPLICATION;  Surgeon: Bernestine Amass, MD;  [facility];  Service: Urology;  Laterality: Left;  . LITHOTRIPSY  03/2012  . LITHOTRIPSY  2/16  . THYROIDECTOMY  05/15/2010  . TONSILLECTOMY AND ADENOIDECTOMY    . UPPER GASTROINTESTINAL ENDOSCOPY    . URETEROSCOPY  03/03/2012   Procedure: URETEROSCOPY;  Surgeon: Bernestine Amass, MD;  [facility];  Service: Urology;  Laterality: Left;  . URETEROSCOPY WITH HOLMIUM LASER LITHOTRIPSY Bilateral 01/15/2018   Procedure: CYSTOSCOPY/BILATERAL URETEROSCOPY WITH HOLMIUM LASER LITHOTRIPSY STONE REMOVAL;  Surgeon: Ardis Hughs, MD;  [facility];  Service: Urology;  Laterality: Bilateral;     OB History    Gravida  0   Para  0   Term  0   Preterm  0   AB  0   Living  0     SAB  0   TAB  0   Ectopic  0   Multiple  0   Live Births              Family History  Problem Relation Age of Onset  . Hypertension Father   . Diabetes Father   . Lung cancer Father   . Heart attack Father        MI at age 82  . Hypertension Mother   . Hyperthyroidism Mother   . Heart disease Mother   . Heart attack Mother        MI at age 40  . Asthma Brother   . Hypertension Brother        younger  . Heart disease Brother        older  . Dementia Paternal Aunt        Unspecified type; symptoms emerged with advanced age  . Colon cancer Neg Hx   . Esophageal cancer Neg Hx   . Stomach cancer Neg Hx   . Kidney disease Neg Hx   . Liver disease Neg Hx   . Pancreatic cancer Neg Hx     Social History   Tobacco Use  . Smoking status: Former Smoker    Packs/day: 0.50    Years: 25.00    Pack years: 12.50    Types: Cigarettes    Start date: 12/24/1970    Quit date: 05/27/1995    Years since quitting: 24.4  . Smokeless tobacco: Never Used  Substance Use Topics  . Alcohol use: Yes  Alcohol/week: 0.0 standard drinks    Comment: <12 oz per month  . Drug use: No    Home Medications Prior to Admission medications   Medication Sig Start Date End Date Taking? Authorizing Provider  acetaminophen (TYLENOL) 325 MG tablet Take 2 tablets (650 mg total) by mouth every 6 (six) hours as needed for mild pain or moderate pain. 12/08/16  Yes Waynetta Pean, PA-C  albuterol (PROVENTIL HFA;VENTOLIN HFA) 108 (90 Base) MCG/ACT inhaler Inhale 2 puffs into the lungs every 6 (six) hours as needed for wheezing or shortness of breath. 11/10/16  Yes Debbrah Alar, NP  albuterol (PROVENTIL) (2.5 MG/3ML) 0.083% nebulizer solution Take 3 mLs (2.5 mg total) by nebulization every 6 (six) hours as needed for wheezing or shortness of breath. 01/29/18  Yes Debbrah Alar, NP  apixaban (ELIQUIS) 5 MG TABS tablet Take 1 tablet (5 mg total) by mouth 2 (two) times daily. 09/19/19  Yes Lelon Perla, MD  calcitRIOL (ROCALTROL) 0.5 MCG capsule Take 2 mcg by mouth daily.    Yes [provider]  CRANBERRY CONCENTRATE PO Take 30,000 mg by mouth daily.   Yes [provider]  cyanocobalamin (,VITAMIN B-12,) 1000 MCG/ML injection ADMINISTER 1ML (1,000 MCG) UNDER THE SKIN EVERY 30 DAYS Patient taking differently: Inject 1,000 mcg into the muscle every 30 (thirty) days.  08/15/19  Yes Debbrah Alar, NP  desoximetasone (TOPICORT) 0.05 % cream Apply topically 2 (two) times daily as needed. Patient taking differently: Apply 1 application topically 2 (two) times daily as needed (irritation).  01/29/17  Yes Debbrah Alar, NP  diclofenac sodium (VOLTAREN) 1 % GEL Apply 2 g topically 4 (four) times daily. Patient taking differently: Apply 2 g topically 4 (four) times daily as needed (pain).  05/05/18  Yes Debbrah Alar, NP  diltiazem (CARDIZEM CD) 120 MG 24 hr capsule Take 1 capsule (120 mg total) by mouth daily. 06/29/19 10/14/19 Yes Debbrah Alar, NP  EPINEPHrine (EPIPEN 2-PAK)  0.3 mg/0.3 mL IJ SOAJ injection Inject 0.3 mg into the muscle once as needed for anaphylaxis (severe allergic reaction).    Yes [provider]  furosemide (LASIX) 40 MG tablet Take 1 tablet (40 mg total) by mouth 2 (two) times daily. Patient taking differently: Take 20 mg by mouth 2 (two) times daily.  06/17/19  Yes Lelon Perla, MD  hydrocortisone cream 1 % Apply topically 4 (four) times daily as needed for itching. 10/24/18  Yes Kyle, Tyrone A, DO  levocetirizine (XYZAL) 5 MG tablet TAKE 1 TABLET BY MOUTH  EVERY EVENING Patient taking differently: Take 5 mg by mouth every morning.  08/18/18  Yes Debbrah Alar, NP  levothyroxine (SYNTHROID, LEVOTHROID) 100 MCG tablet Take 400 mcg by mouth daily before breakfast.    Yes [provider]  Melatonin 5 MG TABS Take 5 mg by mouth at bedtime.    Yes [provider]  MYRBETRIQ 50 MG TB24 tablet Take 50 mg by mouth daily. 07/25/19  Yes [provider]  nystatin (MYCOSTATIN/NYSTOP) powder Apply topically 2 (two) times daily. Patient taking differently: Apply 1 application topically 2 (two) times daily as needed (irritation).  12/22/18  Yes Debbrah Alar, NP  nystatin cream (MYCOSTATIN) Apply 1 application topically 2 (two) times daily. 01/21/19  Yes Debbrah Alar, NP  omeprazole (PRILOSEC) 40 MG capsule Take 1 capsule (40 mg total) by mouth in the morning and at bedtime. 09/08/19  Yes Levin Erp, PA  ondansetron (ZOFRAN) 4 MG tablet Take 1 tablet (4 mg total)  by mouth every 8 (eight) hours as needed for nausea or vomiting. 12/30/17  Yes Debbrah Alar, NP  simethicone (GAS-X) 80 MG chewable tablet Chew 1 tablet by mouth 4 (four) times daily as needed for flatulence.    Yes [provider]  spironolactone (ALDACTONE) 25 MG tablet Take 1 tablet (25 mg total) by mouth daily. 07/20/19  Yes Debbrah Alar, NP  SYMBICORT 160-4.5 MCG/ACT inhaler INHALE 2 PUFFS BY MOUTH TWO TIMES  DAILY Patient taking differently: Inhale 2 puffs into the lungs in the morning and at bedtime.  12/16/18  Yes Debbrah Alar, NP  Syringe/Needle, Disp, (SYRINGE 3CC/20GX1") 20G X 1" 3 ML MISC Use monthly as directed Patient taking differently: 1 each by Other route every 30 (thirty) days.  08/04/18  Yes Debbrah Alar, NP  traMADol (ULTRAM) 50 MG tablet TAKE 1 TABLET(50 MG) BY MOUTH EVERY 12 HOURS AS NEEDED Patient taking differently: Take 50 mg by mouth every 12 (twelve) hours as needed for moderate pain. TAKE 1 TABLET(50 MG) BY MOUTH EVERY 12 HOURS AS NEEDED 05/26/19  Yes Debbrah Alar, NP  trimethoprim (TRIMPEX) 100 MG tablet Take 100 mg by mouth daily. 04/29/19  Yes [provider]  Vitamin D, Ergocalciferol, (DRISDOL) 1.25 MG (50000 UNIT) CAPS capsule TAKE 1 CAPSULE BY MOUTH  EVERY [place], [place],  [place]. Patient taking differently: Take 50,000 Units by mouth 3 (three) times a week. TAKE 1 CAPSULE BY MOUTH  EVERY MONDAY, WEDNESDAY,  AND FRIDAY. 09/16/19  Yes Debbrah Alar, NP  zafirlukast (ACCOLATE) 20 MG tablet Take 1 tablet (20 mg total) by mouth 2 (two) times daily before a meal. 07/20/19  Yes Debbrah Alar, NP  DULoxetine (CYMBALTA) 30 MG capsule Take one 30 mg tablet by mouth once a day for one week. Then increase to one 30 mg tablet by mouth TWICE a day and stay at that dose thereafter. Patient not taking: Reported on 10/14/2019 09/23/19   Debbrah Alar, NP    Allergies    Nutritional supplements, Other, Amoxicillin-pot clavulanate, Ciprofloxacin, Food, Keflex [cephalexin], Penicillins, Rocephin [ceftriaxone], and Tomato  Review of Systems   Review of Systems  Unable to perform ROS: Mental status change    Physical Exam Updated Vital Signs BP (!) 93/55 (BP Location: Right Arm)   Pulse 72   Temp (!) 93.1 F (33.9 C) (Rectal)   Resp 15   LMP 06/11/2012   SpO2 97%   Physical Exam Vitals and nursing note reviewed.  Constitutional:       Appearance: She is well-developed. She is obese.  HENT:     Head: Normocephalic and atraumatic.     Right Ear: External ear normal.     Left Ear: External ear normal.     Nose: Nose normal.  Eyes:     General:        Right eye: No discharge.        Left eye: No discharge.     Extraocular Movements: Extraocular movements intact.     Pupils: Pupils are equal, round, and reactive to light.  Cardiovascular:     Rate and Rhythm: Normal rate and regular rhythm.     Heart sounds: Normal heart sounds.  Pulmonary:     Effort: Pulmonary effort is normal.     Breath sounds: Normal breath sounds.  Abdominal:     Palpations: Abdomen is soft.     Tenderness: There is no abdominal tenderness. There is right CVA tenderness.  Skin:    General: Skin is warm  and dry.  Neurological:     Mental Status: She is alert. She is disoriented.     Comments: Awake, alert, oriented to person and place. Disoriented to time (off on month and year). CN 3-12 grossly intact. 5/5 strength in BUE. Normal finger to nose. 3/5 strength in BLE.  Psychiatric:        Mood and Affect: Mood is not anxious.     ED Results / Procedures / Treatments   Labs (all labs ordered are listed, but only abnormal results are displayed) Labs Reviewed  COMPREHENSIVE METABOLIC PANEL - Abnormal; Notable for the following components:      Result Value   CO2 18 (*)    BUN 70 (*)    Creatinine, Ser 2.06 (*)    Calcium 8.2 (*)    Total Protein 6.4 (*)    Albumin 2.5 (*)    Total Bilirubin 1.4 (*)    GFR calc non Af Amer 25 (*)    GFR calc Af Amer 29 (*)    Anion gap 18 (*)    All other components within normal limits  CBC WITH DIFFERENTIAL/PLATELET - Abnormal; Notable for the following components:   RBC 2.82 (*)    Hemoglobin 7.8 (*)    HCT 26.7 (*)    MCHC 29.2 (*)    RDW 18.7 (*)    Platelets 43 (*)    nRBC 0.5 (*)    Lymphs Abs 0.2 (*)    All other components within normal limits  APTT - Abnormal; Notable for the  following components:   aPTT 84 (*)    All other components within normal limits  PROTIME-INR - Abnormal; Notable for the following components:   Prothrombin Time 23.4 (*)    INR 2.2 (*)    All other components within normal limits  URINALYSIS, ROUTINE W REFLEX MICROSCOPIC - Abnormal; Notable for the following components:   APPearance CLOUDY (*)    Hgb urine dipstick LARGE (*)    Protein, ur 100 (*)    Nitrite POSITIVE (*)    Leukocytes,Ua MODERATE (*)    RBC / HPF >50 (*)    WBC, UA >50 (*)    Bacteria, UA MANY (*)    All other components within normal limits  POCT I-STAT EG7 - Abnormal; Notable for the following components:   pH, Ven 7.445 (*)    pCO2, Ven 29.1 (*)    pO2, Ven 97.0 (*)    TCO2 21 (*)    Acid-base deficit 3.0 (*)    Calcium, Ion 1.05 (*)    HCT 25.0 (*)    Hemoglobin 8.5 (*)    All other components within normal limits  CULTURE, BLOOD (ROUTINE X 2)  CULTURE, BLOOD (ROUTINE X 2)  URINE CULTURE  LACTIC ACID, PLASMA  TSH  T4, FREE    EKG EKG Interpretation  Date/Time:  Friday Oct 14 2019 13:08:32 EDT Ventricular Rate:  64 PR Interval:    QRS Duration: 91 QT Interval:  418 QTC Calculation: 428 R Axis:   35 Text Interpretation: Sinus rhythm Short PR interval Low voltage, precordial leads Consider anterior infarct Borderline repolarization abnormality Artifact in lead(s) I II aVR aVL Interpretation limited secondary to artifact Confirmed by Sherwood Gambler [phone]) on 10/14/2019 1:17:53 PM   Radiology DG Chest Port 1 View  Result Date: 10/14/2019 CLINICAL DATA:  Weakness EXAM: PORTABLE CHEST 1 VIEW COMPARISON:  12/12/2018 FINDINGS: Cardiac shadow is stable. Mild aortic calcifications are seen. The lungs are clear  bilaterally. No bony abnormality is seen. IMPRESSION: No acute abnormality noted. Electronically Signed   By: Inez Catalina M.D.   On: 10/14/2019 13:10    Procedures Procedures (including critical care time)  Medications Ordered in  ED Medications  ceFEPIme (MAXIPIME) 2 g in sodium chloride 0.9 % 100 mL IVPB (has no administration in time range)  lactated ringers bolus 1,000 mL (has no administration in time range)  lactated ringers bolus 1,000 mL (1,000 mLs Intravenous New Bag/Given 10/14/19 1409)    ED Course  I have reviewed the triage vital signs and the nursing notes.  Pertinent labs & imaging results that were available during my care of the patient were reviewed by me and considered in my medical decision making (see chart for details).  Clinical Course as of Oct 13 1556  Fri Oct 14, 2019  1511 Patient's rectal temperature is 93.  We will add on thyroid studies though she is on Synthroid for prior thyroid cancer, perhaps an acute infection is causing her to have hypothyroidism as well.  Will give antibiotics.  She is not technically sepsis with normal WBC, normal lactate and no frank hypotension.  However I am definitely worried about infection, probably urinary.  Discussed with pharmacy, multiple allergies but she has tolerated cefepime in the past and it would be reasonable to give her this for now.   [SG]    Clinical Course User Index [SG] Sherwood Gambler, MD   MDM Rules/Calculators/A&P                      Patient's urine is positive for urinary tract infection.  This combined with her acute kidney injury is likely the cause of her altered mental state.  However she is on Eliquis and will need CT head.  Has had multiple prior kidney stones before as well so we will also get noncontrasted CT abdomen pelvis, especially with patient having some right flank/back pain.  She is having soft blood pressures but is not overtly hypotensive.  Her leg edema is probably more from a thyroid issue than CHF given clear lungs and clear chest x-ray.  CT head and abdomen are still pending at this time.  Care transferred to Dr. Eulis Foster. She will need admission after that.  Final Clinical Impression(s) / ED Diagnoses Final  diagnoses:  Acute urinary tract infection  Acute kidney injury ([place])  Hypothermia, initial encounter    Rx / DC Orders ED Discharge Orders    None       Sherwood Gambler, MD 10/14/19 1637

## 2019-10-14 NOTE — ED Notes (Signed)
Family requested pt be removed from [facility].  Dr Eulis Foster stated ok if pt rectal temp was 96.  Temp 95.9 rectal.

## 2019-10-14 NOTE — ED Provider Notes (Signed)
4:40 PM-patient checked out by Dr. Verner Chol to arrange for admission following imaging CT head and abdomen.  She has UTI with AKI and confusion suspected to be secondary to infection.  Blood pressure has been soft.  She takes Synthroid, status post thyroidectomy.  TSH today is normal and T4 is elevated, indicating that he does not have a hypo-thyroid crisis.  She has been started on broad-spectrum antibiotic, cefepime.   This time the patient is alert and conversant she requests pain medicine tramadol Voltaren for her leg pain, which is normal treatment for her.  Blood pressure is now 123456 systolic.  Patient children are with her and report she is typically "very bright," and she has not been able to walk since 4 days ago.  At that time the best walking she could do was to the bathroom and back.  She is now tolerating a bedside commode, and diaper.  The patient is not currently dysarthric or aphasic.  She is currently on a warming blanket and states that it is tolerable.   7:30 PM-she is now off the warming blanket and reportedly feels better.  Discussed CT head and abdomen/pelvis findings with family members, some of her laboratory tests and answered all of their questions.  They understand that she requires admission for further care and treatment.  7:31 PM-Consult complete with hospitalist. Patient case explained and discussed.  Medical screening examination/treatment/procedure(s) were conducted as a shared visit with non-physician practitioner(s) and myself.  I personally evaluated the patient during the encounter agrees to admit patient for further evaluation and treatment. Call ended at 8:19 PM   Daleen Bo, MD 10/14/19 2019

## 2019-10-14 NOTE — ED Notes (Signed)
Pt transported to Xray. 

## 2019-10-14 NOTE — ED Triage Notes (Signed)
Pt here from home via [facility] for increasing weakness and altered mental status over the last week.  Family states pt normally ao x 4, and over the last week. Today she can't tell you the date.  Also, family was attempting to transfer her from wheelchair to car to bring her here when she fell, hitting her knee on the ground.  They were able to lower her to the ground with a gait belt.  Family state increasing generalized weakness, altered mental status and increasing urinary frequency over the past week.  118/82 Hr 74 spo2 98 Temp 97 cbg 95

## 2019-10-15 ENCOUNTER — Other Ambulatory Visit: Payer: Self-pay

## 2019-10-15 DIAGNOSIS — N390 Urinary tract infection, site not specified: Secondary | ICD-10-CM

## 2019-10-15 DIAGNOSIS — D696 Thrombocytopenia, unspecified: Secondary | ICD-10-CM

## 2019-10-15 DIAGNOSIS — D649 Anemia, unspecified: Secondary | ICD-10-CM

## 2019-10-15 DIAGNOSIS — A4151 Sepsis due to Escherichia coli [E. coli]: Principal | ICD-10-CM

## 2019-10-15 DIAGNOSIS — R319 Hematuria, unspecified: Secondary | ICD-10-CM

## 2019-10-15 DIAGNOSIS — N179 Acute kidney failure, unspecified: Secondary | ICD-10-CM

## 2019-10-15 DIAGNOSIS — T68XXXA Hypothermia, initial encounter: Secondary | ICD-10-CM

## 2019-10-15 LAB — CBC
HCT: 25.3 % — ABNORMAL LOW (ref 36.0–46.0)
HCT: 24.6 % — ABNORMAL LOW (ref 36.0–46.0)
Hemoglobin: 7.3 g/dL — ABNORMAL LOW (ref 12.0–15.0)
Hemoglobin: 7.2 g/dL — ABNORMAL LOW (ref 12.0–15.0)
MCH: 27.4 pg (ref 26.0–34.0)
MCH: 27.7 pg (ref 26.0–34.0)
MCHC: 28.9 g/dL — ABNORMAL LOW (ref 30.0–36.0)
MCHC: 29.3 g/dL — ABNORMAL LOW (ref 30.0–36.0)
MCV: 95.1 fL (ref 80.0–100.0)
MCV: 94.6 fL (ref 80.0–100.0)
Platelets: 28 10*3/uL — CL (ref 150–400)
Platelets: 30 10*3/uL — ABNORMAL LOW (ref 150–400)
RBC: 2.66 MIL/uL — ABNORMAL LOW (ref 3.87–5.11)
RBC: 2.6 MIL/uL — ABNORMAL LOW (ref 3.87–5.11)
RDW: 18.8 % — ABNORMAL HIGH (ref 11.5–15.5)
RDW: 18.8 % — ABNORMAL HIGH (ref 11.5–15.5)
WBC: 5.2 10*3/uL (ref 4.0–10.5)
WBC: 4.7 10*3/uL (ref 4.0–10.5)
nRBC: 0.6 % — ABNORMAL HIGH (ref 0.0–0.2)
nRBC: 0.9 % — ABNORMAL HIGH (ref 0.0–0.2)

## 2019-10-15 LAB — DIC (DISSEMINATED INTRAVASCULAR COAGULATION)PANEL
D-Dimer, Quant: 0.59 ug/mL-FEU — ABNORMAL HIGH (ref 0.00–0.50)
Fibrinogen: >800 mg/dL — ABNORMAL HIGH (ref 210–475)
INR: 2.2 — ABNORMAL HIGH (ref 0.8–1.2)
Platelets: 25 10*3/uL — CL (ref 150–400)
Prothrombin Time: 23.6 seconds — ABNORMAL HIGH (ref 11.4–15.2)
Smear Review: NONE SEEN
aPTT: 78 seconds — ABNORMAL HIGH (ref 24–36)

## 2019-10-15 LAB — FOLATE: Folate: 8.9 ng/mL (ref >5.9)

## 2019-10-15 LAB — IRON AND TIBC
Iron: 35 ug/dL (ref 28–170)
Saturation Ratios: 14 % (ref 10.4–31.8)
TIBC: 244 ug/dL — ABNORMAL LOW (ref 250–450)
UIBC: 209 ug/dL

## 2019-10-15 LAB — RETICULOCYTES
Immature Retic Fract: 12.2 % (ref 2.3–15.9)
RBC.: 2.54 MIL/uL — ABNORMAL LOW (ref 3.87–5.11)
Retic Count, Absolute: 61 10*3/uL (ref 19.0–186.0)
Retic Ct Pct: 2.4 % (ref 0.4–3.1)

## 2019-10-15 LAB — IMMATURE PLATELET FRACTION: Immature Platelet Fraction: 19.1 % — ABNORMAL HIGH (ref 1.2–8.6)

## 2019-10-15 LAB — BRAIN NATRIURETIC PEPTIDE: B Natriuretic Peptide: 20.5 pg/mL (ref 0.0–100.0)

## 2019-10-15 LAB — FERRITIN: Ferritin: 2328 ng/mL — ABNORMAL HIGH (ref 11–307)

## 2019-10-15 LAB — TROPONIN I (HIGH SENSITIVITY): Troponin I (High Sensitivity): 9 ng/L (ref <18)

## 2019-10-15 LAB — CORTISOL: Cortisol, Plasma: 19.4 ug/dL

## 2019-10-15 LAB — VITAMIN B12: Vitamin B-12: 1499 pg/mL — ABNORMAL HIGH (ref 180–914)

## 2019-10-15 MED ORDER — MONTELUKAST SODIUM 10 MG PO TABS
10.0000 mg | ORAL_TABLET | Freq: Every day | ORAL | Status: DC
  Administered 2019-10-15 – 2019-10-21 (×7): 10 mg via ORAL
  Filled 2019-10-15 (×8): qty 1

## 2019-10-15 MED ORDER — SENNOSIDES-DOCUSATE SODIUM 8.6-50 MG PO TABS
1.0000 | ORAL_TABLET | Freq: Every day | ORAL | Status: DC
  Administered 2019-10-15 – 2019-10-22 (×7): 1 via ORAL
  Filled 2019-10-15 (×7): qty 1

## 2019-10-15 MED ORDER — ORAL CARE MOUTH RINSE
15.0000 mL | Freq: Two times a day (BID) | OROMUCOSAL | Status: DC
  Administered 2019-10-15 – 2019-10-21 (×12): 15 mL via OROMUCOSAL

## 2019-10-15 MED ORDER — RESOURCE THICKENUP CLEAR PO POWD
ORAL | Status: DC | PRN
  Filled 2019-10-15: qty 125

## 2019-10-15 MED ORDER — LACTATED RINGERS IV SOLN: INTRAVENOUS | Status: DC

## 2019-10-15 NOTE — Progress Notes (Signed)
PROGRESS NOTE  HA REINKING  [id number] DOB: May 17, 1956 DOA: 10/14/2019 PCP: Debbrah Alar, NP  Outpatient Specialists: Laverna Peace, NP Brief Narrative: [name] is a 64 y.o. female with a history of PAF on eliquis, gastric bypass with iron deficiency anemia, hypothyroidism, chronic HFpEF, HTN, COPD/asthma, anxiety, depression and morbid obesity who presented to the ED with increasing confusion and generalized weakness as well as right knee pain after falling on it during a transfer at home. She also complained of dysuria but was confused on arrival. Family has noticed increasing leg swelling and some abdominal distention despite not eating much for a few days. She was hypothermic, hypotensive with hgb 7.8, down from baseline, normocytic, and platelets 43k down from normal previously, creatinine 2 up from 1.4, UA with hematuria, pyuria, bacteriuria, CXR no active disease. CT head without acute abnormality. CT renal stone study showed nonobstructing nephrolithiasis with mild prominence of the left renal pelvis with perinephric edema. CT also showing large volume stool throughout the colon concerning for constipation.  No bowel obstruction or inflammation.  Liver with nodular hepatic contour, can be seen with cirrhosis.  Mild splenomegaly, unchanged. She was given cefepime, 2L LR, tramadol and voltaren gel. Platelets have continued downward trend and hematology is consulted.  Assessment & Plan: Principal Problem:   UTI (urinary tract infection) Active Problems:   Sepsis ([place])   Hypothermia   Thrombocytopenia ([facility])   AKI (acute kidney injury) ([place])  Sepsis secondary to UTI: Hypothermic, hypotensive. Cortisol adequate - Continue cefepime (tolerating this despite CTX allergy) - Monitor urine and blood cultures  Thrombocytopenia: Possibly related to sepsis. With mild splenomegaly and known hepatic steatosis with nodular changes ?cirrhosis. Also has INR 2.2, elevated d-dimer  and fibrinogen. No schistocytes, immature platelet fracture up appropriately.  - Appreciate hematology evaluation - No indication for transfusion at this time, though patient and family consent if felt to be needed.  - Continue monitoring platelets.   Iron deficiency anemia: Felt to be due to malabsorption, though plan was for EGD and colonoscopy recently by Dr. Henrene Pastor though patient's weakness made them cancel this appt.  - Discussed with GI, Tye Savoy, NP, who discussed with Dr. Ardis Hughs. No indication for urgent endoscopic evaluation, so will defer to Monday at the earliest to decide whether inpatient eval will be pursued.   Elevated d-dimer, leg edema:  - R/o DVT w/LE venous U/S  AKI with NAGMA: BUN 70, creatinine 2.0.  Creatinine was 1.4 on 08/29/2019. Elevated BUN argues for prerenal azotemia (vs. upper GI bleed).  - Continue IV fluids with no pulmonary edema and AKI and poor po intake.  - Hold lasix, spironolactone.  - BMP in AM.   Acute metabolic encephalopathy: Likely related to AKI, dehydration. No CT findings.  - Tx UTI - Correct renal failure, metabolic derangements - Delirium precautions   Right knee pain: Knee pain started after her fall.  X-ray showing large joint effusion without evidence of an acute displaced fracture or dislocation.  - Continue pain management with Tylenol, Voltaren gel. - With traumatic effusion, consider cross-sectional imaging vs. orthopedics consult if not improving.  Generalized weakness:  - PT/OT  Dysphagia:  - SLP evaluation  Chronic diastolic CHF: BNP wnl, no pulmonary edema, appears volume down.  - Hold lasix, give IVF for now as above  PAF:  - Holding diltiazem with hypotension and controlled rate (NSR) - Hold eliquis w/severe thrombocytopenia  Hypothyroidism: TSH normal.   - Continue home synthroid 440mcg!  Constipation: CT also showing  large volume stool throughout the colon concerning for constipation.  No bowel obstruction  or inflammation.   - Add senna to miralax scheduled  COPD/asthma: No exacerbation.  - continue BD.   DVT prophylaxis: SCDs Code Status: Full Family Communication: At bedside Disposition Plan:  Status is: Inpatient  Remains inpatient appropriate because:Persistent severe electrolyte disturbances, Ongoing diagnostic testing needed not appropriate for outpatient work up and IV treatments appropriate due to intensity of illness or inability to take PO   Dispo: The patient is from: Home              Anticipated d/c is to: TBD              Anticipated d/c date is: 3 days              Patient currently is not medically stable to d/c.  Consultants:   Hematology  GI  Procedures:   None  Antimicrobials:  Cefepime 5/21 >>    Subjective: More alert today, still more confused than baseline. Having baseline bilateral leg pain, also with right knee pain worse with touching it. No dyspnea, chest pain. Has bruising on forearms, none new, always has these. No other rashes. Denies fever. Can't recall that she complained of dysuria yesterday.  Objective: Vitals:   10/15/19 0824 10/15/19 0833 10/15/19 1204 10/15/19 1633  BP:   125/76 118/61  Pulse:   65 68  Resp: 16  14 14   Temp: 98.1 F (36.7 C)  (!) 96.3 F (35.7 C) (!) 96.4 F (35.8 C)  TempSrc:   Axillary Axillary  SpO2:  99% 98% 97%  Weight:      Height:        Intake/Output Summary (Last 24 hours) at 10/15/2019 1657 Last data filed at 10/15/2019 1200 Gross per 24 hour  Intake 1000 ml  Output 1600 ml  Net -600 ml   Filed Weights   10/14/19 1300 10/15/19 0111  Weight: 108.9 kg 111 kg   Gen: 64 y.o. female in no distress Pulm: Non-labored breathing room air. Clear to auscultation bilaterally.  CV: Regular rate and rhythm. No murmur, rub, or gallop. No JVD, + pedal edema. GI: Abdomen soft, non-tender, non-distended, with normoactive bowel sounds. No organomegaly or masses felt. Ext: Warm, no deformities, bilateral knee  effusions, both tender, no erythema or bruising.  Skin: Ecchymoses on volar forearms L > R and telangiectasias on chest, no purpura or ulcers Neuro: Alert and mostly oriented. No focal neurological deficits. Psych: Judgement and insight appear fair. Mood & affect appropriate.   Data Reviewed: I have personally reviewed following labs and imaging studies  CBC: Recent Labs  Lab 10/14/19 1330 10/14/19 1338 10/14/19 2311 10/15/19 0446  WBC 5.9  --  5.2 4.7  NEUTROABS 5.3  --   --   --   HGB 7.8* 8.5* 7.3* 7.2*  HCT 26.7* 25.0* 25.3* 24.6*  MCV 94.7  --  95.1 94.6  PLT 43*  --  28* 25*   30*   Basic Metabolic Panel: Recent Labs  Lab 10/14/19 1330 10/14/19 1338  NA 141 138  K 4.1 3.8  CL 105  --   CO2 18*  --   GLUCOSE 85  --   BUN 70*  --   CREATININE 2.06*  --   CALCIUM 8.2*  --    GFR: Estimated Creatinine Clearance: 34.5 mL/min (A) (by C-G formula based on SCr of 2.06 mg/dL (H)). Liver Function Tests: Recent Labs  Lab 10/14/19 1330  AST 21  ALT 24  ALKPHOS 125  BILITOT 1.4*  PROT 6.4*  ALBUMIN 2.5*   No results for input(s): LIPASE, AMYLASE in the last 168 hours. No results for input(s): AMMONIA in the last 168 hours. Coagulation Profile: Recent Labs  Lab 10/14/19 1330 10/15/19 0446  INR 2.2* 2.2*   Cardiac Enzymes: No results for input(s): CKTOTAL, CKMB, CKMBINDEX, TROPONINI in the last 168 hours. BNP (last 3 results) No results for input(s): PROBNP in the last 8760 hours. HbA1C: No results for input(s): HGBA1C in the last 72 hours. CBG: No results for input(s): GLUCAP in the last 168 hours. Lipid Profile: No results for input(s): CHOL, HDL, LDLCALC, TRIG, CHOLHDL, LDLDIRECT in the last 72 hours. Thyroid Function Tests: Recent Labs    10/14/19 1525  TSH 0.398  FREET4 1.70*   Anemia Panel: Recent Labs    10/15/19 0446  VITAMINB12 1,499*  FOLATE 8.9  FERRITIN 2,328*  TIBC 244*  IRON 35  RETICCTPCT 2.4   Urine analysis:    Component  Value Date/Time   COLORURINE YELLOW 10/14/2019 1530   APPEARANCEUR CLOUDY (A) 10/14/2019 1530   LABSPEC 1.011 10/14/2019 1530   LABSPEC 1.010 06/09/2007 1500   PHURINE 5.0 10/14/2019 1530   GLUCOSEU NEGATIVE 10/14/2019 1530   GLUCOSEU NEGATIVE 02/16/2018 1202   HGBUR LARGE (A) 10/14/2019 1530   HGBUR negative 05/09/2010 1026   BILIRUBINUR NEGATIVE 10/14/2019 1530   BILIRUBINUR negative 04/06/2019 1434   BILIRUBINUR N 05/01/2016 1104   BILIRUBINUR Negative 06/09/2007 1500   KETONESUR NEGATIVE 10/14/2019 1530   PROTEINUR 100 (A) 10/14/2019 1530   UROBILINOGEN 0.2 04/06/2019 1434   UROBILINOGEN 0.2 02/16/2018 1202   NITRITE POSITIVE (A) 10/14/2019 1530   LEUKOCYTESUR MODERATE (A) 10/14/2019 1530   LEUKOCYTESUR Negative 06/09/2007 1500   Recent Results (from the past 240 hour(s))  SARS CORONAVIRUS 2 (TAT 6-24 HRS) Nasopharyngeal Nasopharyngeal Swab     Status: None   Collection Time: 10/06/19 12:33 PM   Specimen: Nasopharyngeal Swab  Result Value Ref Range Status   SARS Coronavirus 2 NEGATIVE NEGATIVE Final    Comment: (NOTE) SARS-CoV-2 target nucleic acids are NOT DETECTED. The SARS-CoV-2 RNA is generally detectable in upper and lower respiratory specimens during the acute phase of infection. Negative results do not preclude SARS-CoV-2 infection, do not rule out co-infections with other pathogens, and should not be used as the sole basis for treatment or other patient management decisions. Negative results must be combined with clinical observations, patient history, and epidemiological information. The expected result is Negative. Fact Sheet for Patients: SugarRoll.be Fact Sheet for Healthcare Providers: https://www.woods-mathews.com/ This test is not yet approved or cleared by the [place] FDA and  has been authorized for detection and/or diagnosis of SARS-CoV-2 by FDA under an Emergency Use Authorization (EUA). This EUA will  remain  in effect (meaning this test can be used) for the duration of the COVID-19 declaration under Section 56 4(b)(1) of the Act, 21 U.S.C. section 360bbb-3(b)(1), unless the authorization is terminated or revoked sooner. Performed at [facility], [place] [address] [place], [place] [address]   Blood Culture (routine x 2)     Status: None (Preliminary result)   Collection Time: 10/14/19  1:20 PM   Specimen: BLOOD  Result Value Ref Range Status   Specimen Description BLOOD RIGHT ANTECUBITAL  Final   Special Requests   Final    BOTTLES DRAWN AEROBIC AND ANAEROBIC Blood Culture results may not be optimal due to an inadequate volume  of blood received in culture bottles   Culture   Final    NO GROWTH < 24 HOURS Performed at [facility], [place] [address] [place], [place] [address]    Report Status PENDING  Incomplete  Blood Culture (routine x 2)     Status: None (Preliminary result)   Collection Time: 10/14/19  1:25 PM   Specimen: BLOOD RIGHT HAND  Result Value Ref Range Status   Specimen Description BLOOD RIGHT HAND  Final   Special Requests   Final    BOTTLES DRAWN AEROBIC AND ANAEROBIC Blood Culture adequate volume   Culture   Final    NO GROWTH < 24 HOURS Performed at [facility], [place] [address], [place], [place] [address]    Report Status PENDING  Incomplete  Urine culture     Status: Abnormal (Preliminary result)   Collection Time: 10/14/19  3:10 PM   Specimen: In/Out Cath Urine  Result Value Ref Range Status   Specimen Description IN/OUT CATH URINE  Final   Special Requests NONE  Final   Culture (A)  Final    >=100,000 COLONIES/mL ESCHERICHIA COLI SUSCEPTIBILITIES TO FOLLOW Performed at [facility], [place] [address] [place], [place] [address]    Report Status PENDING  Incomplete  SARS Coronavirus 2 by RT PCR (hospital order, performed in [facility] hospital lab) Nasopharyngeal Nasopharyngeal Swab     Status: None   Collection Time: 10/14/19   5:55 PM   Specimen: Nasopharyngeal Swab  Result Value Ref Range Status   SARS Coronavirus 2 NEGATIVE NEGATIVE Final    Comment: (NOTE) SARS-CoV-2 target nucleic acids are NOT DETECTED. The SARS-CoV-2 RNA is generally detectable in upper and lower respiratory specimens during the acute phase of infection. The lowest concentration of SARS-CoV-2 viral copies this assay can detect is 250 copies / mL. A negative result does not preclude SARS-CoV-2 infection and should not be used as the sole basis for treatment or other patient management decisions.  A negative result may occur with improper specimen collection / handling, submission of specimen other than nasopharyngeal swab, presence of viral mutation(s) within the areas targeted by this assay, and inadequate number of viral copies (<250 copies / mL). A negative result must be combined with clinical observations, patient history, and epidemiological information. Fact Sheet for Patients:   StrictlyIdeas.no Fact Sheet for Healthcare Providers: BankingDealers.co.za This test is not yet approved or cleared  by the [place] FDA and has been authorized for detection and/or diagnosis of SARS-CoV-2 by FDA under an Emergency Use Authorization (EUA).  This EUA will remain in effect (meaning this test can be used) for the duration of the COVID-19 declaration under Section 564(b)(1) of the Act, 21 U.S.C. section 360bbb-3(b)(1), unless the authorization is terminated or revoked sooner. Performed at [facility], [place] [address] [place],  [address]       Radiology Studies: DG Knee 1-2 Views Right  Result Date: 10/14/2019 CLINICAL DATA:  Knee pain EXAM: RIGHT KNEE - 1-2 VIEW COMPARISON:  06/30/2018 FINDINGS: There are advanced tricompartmental degenerative changes. There is a large joint effusion, new from prior study. There is no definite acute displaced fracture or dislocation.  IMPRESSION: 1. Large joint effusion without evidence for an acute displaced fracture or dislocation. If there is high clinical suspicion for an occult fracture, follow-up with cross-sectional imaging is recommended. 2. Advanced tricompartmental degenerative changes are noted. Electronically Signed   By: Jamie Kato.D.  On: 10/14/2019 23:07   CT Head Wo Contrast  Result Date: 10/14/2019 CLINICAL DATA:  Altered mental status.  Increasing weakness. EXAM: CT HEAD WITHOUT CONTRAST TECHNIQUE: Contiguous axial images were obtained from the base of the skull through the vertex without intravenous contrast. COMPARISON:  CT head 11/21/2018.  Cranial MRI 08/12/2019. FINDINGS: Brain: There is no evidence of acute intracranial hemorrhage, mass lesion, brain edema or extra-axial fluid collection. The ventricles and subarachnoid spaces are appropriately sized for age. There is no CT evidence of acute cortical infarction. Vascular: Mild intracranial vascular calcifications. No hyperdense vessel identified. Skull: Negative for fracture or focal lesion. Calvarial hyperostosis again noted. Sinuses/Orbits: The visualized paranasal sinuses and mastoid air cells are clear. No orbital abnormalities are seen. Other: None. IMPRESSION: Stable head CT. No acute intracranial findings. Electronically Signed   By: Richardean Sale M.D.   On: 10/14/2019 18:56   DG Chest Port 1 View  Result Date: 10/14/2019 CLINICAL DATA:  Weakness EXAM: PORTABLE CHEST 1 VIEW COMPARISON:  12/12/2018 FINDINGS: Cardiac shadow is stable. Mild aortic calcifications are seen. The lungs are clear bilaterally. No bony abnormality is seen. IMPRESSION: No acute abnormality noted. Electronically Signed   By: Inez Catalina M.D.   On: 10/14/2019 13:10   CT Renal Stone Study  Result Date: 10/14/2019 CLINICAL DATA:  Increasing weakness and altered mental status. Flank pain, kidney stone suspected EXAM: CT ABDOMEN AND PELVIS WITHOUT CONTRAST TECHNIQUE:  Multidetector CT imaging of the abdomen and pelvis was performed following the standard protocol without IV contrast. COMPARISON:  Abdominal CT 12/12/2018 FINDINGS: Lower chest: Minor dependent atelectasis. No pleural effusion. Coronary artery calcifications. Normal heart size. Hepatobiliary: Previous low-density attenuation throughout the liver is no longer seen. No evidence of focal hepatic lesion. Liver contour somewhat nodular, also seen on prior. No calcified gallstone or pericholecystic inflammation. For gene cap in the gallbladder. No biliary dilatation. Pancreas: Parenchymal atrophy. No ductal dilatation or inflammation. Spleen: Again seen splenomegaly, spanning 13.8 cm cranial caudal. No focal abnormality. Adrenals/Urinary Tract: No adrenal nodule. Fat stranding about the left kidney, also seen on prior exam. Mild prominence of the left renal pelvis. There are multiple nonobstructing stones within the left kidney but no obstructing or ureteral calculus. Multiple nonobstructing stones throughout the right kidney. No right hydronephrosis. No right ureteral calculi. No right perinephric stranding. Urinary bladder is distended without wall thickening or bladder stone. No urethral stones visualized. Stomach/Bowel: Colonic tortuosity. Large volume of stool throughout the colon. No colonic wall thickening. No pericolonic inflammation. No obstruction. Appendix not definitively visualized. No evidence of appendicitis. There is no small bowel obstruction or inflammation. Stomach is decompressed. Vascular/Lymphatic: Moderate aortic atherosclerosis. Bi-iliac atherosclerosis. Coarse calcification causes luminal narrowing of the left common iliac artery. No adenopathy. Reproductive: IUD in the uterus, unusual for age. No adnexal mass. Other: No free air, free fluid, or intra-abdominal fluid collection. Musculoskeletal: Diffuse degenerative change throughout the spine. Scattered bone islands in the pelvis. There are no  acute or suspicious osseous abnormalities. IMPRESSION: 1. Bilateral nonobstructing nephrolithiasis. Mild prominence of the left renal pelvis with perinephric edema, similar to prior exam, may be chronic or seen with urinary tract infection. The possibility of recently passed stone is also considered. 2. Large volume of stool throughout the colon, can be seen with constipation. No bowel obstruction or inflammation. 3. Nodular hepatic contour, can be seen with cirrhosis. Mild splenomegaly, unchanged. Aortic Atherosclerosis (ICD10-I70.0). Electronically Signed   By: Keith Rake M.D.   On: 10/14/2019 19:01  Scheduled Meds:  calcitRIOL  2 mcg Oral Daily   diclofenac Sodium  2 g Topical QID   levothyroxine  400 mcg Oral Q0600   loratadine  10 mg Oral [id number]   mouth rinse  15 mL Mouth Rinse BID   mirabegron ER  50 mg Oral Daily   mometasone-formoterol  2 puff Inhalation BID   montelukast  10 mg Oral QHS   pantoprazole  40 mg Oral BID   polyethylene glycol  17 g Oral Daily   [START ON 10/17/2019] Vitamin D (Ergocalciferol)  50,000 Units Oral Once per day on Mon Wed Fri   Continuous Infusions:  ceFEPime (MAXIPIME) IV 2 g (10/15/19 1131)     LOS: 1 day   Time spent: 35 minutes.  Patrecia Pour, MD [facility] [email or web address] 10/15/2019, 4:57 PM

## 2019-10-15 NOTE — Evaluation (Signed)
Clinical/Bedside Swallow Evaluation Patient Details  Name: [name] MRN: [id number] Date of Birth: [birth year]  Today's Date: 10/15/2019 Time: SLP Start Time (ACUTE ONLY): 1205 SLP Stop Time (ACUTE ONLY): 1230 SLP Time Calculation (min) (ACUTE ONLY): 25 min  Past Medical History:  Past Medical History:  Diagnosis Date  . Abnormal Pap smear    years ago/no biopsy  . Allergic rhinitis   . Aortic atherosclerosis   . Arthritis    back- lower  . Asthma   . Atrial fibrillation 12/2010   OFF XARELTO LAST MONTH DUE TO BLEEDING IN STOOL  . Bursitis of hip left  . Chronic anticoagulation 12/16/2018   CHADS VASC=2 for sex and H/O HTN- she is on Eliquis  . Chronic diastolic (congestive) heart failure    pt unaware of this  . COPD (chronic obstructive pulmonary disease) with chronic bronchitis   . Decreased pulses in feet   . Dysrhythmia    afib, followed by Dr. Stanford Breed   . Edema of both legs   . Endometrial adenocarcinoma 02/21/2013   S/p D and C, has mirena, being followed by GYN   . Esophagitis 02/02/2014   Distal, linear erosions, noted on endoscopy  . Essential hypertension 01/05/2007   Echo Nov June 2020- EF 50-55%, mild LVH, normal LA   . Fatty liver 01/07/2012  . Fibroid 1974   fibroid cyst on left fallopian tube  . Fibromyalgia   . Foot ulcer    AREA HEALED RIGHT FOOT  . Generalized anxiety disorder   . GERD (gastroesophageal reflux disease)   . Headache    occasional sinus headache   . Hepatomegaly   . History of blood transfusion JULY 2015  . History of cardiomegaly 12/06/2010   Noted on CT  . History of E. coli septicemia   . History of papillary thyroid carcinoma 04/07/2011  . Hypocalcemia 12/10/2016  . Hypokalemia 12/10/2016  . Hypoparathyroidism 01/07/2011  . Hypothyroidism   . Iron deficiency anemia secondary to malabsorption  05/10/2007   Qualifier: Diagnosis of  By: Wynona Luna    . Kidney stone 10-06-2015   passed on their own  . Leukocytosis  03/24/2009   cta cheQualifier: Diagnosis of  By: Wynona Luna    . Major depressive disorder   . Mild neurocognitive disorder 06/28/2019  . Morbid obesity   . MRSA infection   . Nephrolithiasis 2/16, 9/16   SEES DR Risa Grill  . Osteomyelitis 06/14/2014  . PAF (paroxysmal atrial fibrillation) 02/05/2011   Documented 2012- NSR since.  On chronic anticoagulation.  . Pernicious anemia 02/20/2014   followed by Debbrah Alar  . Pneumonia   . PONV (postoperative nausea and vomiting)   . Pyelonephritis 11/24/2018  . Rash    thighs and back  . Seizures    infancy secondary to fever  . Staph aureus infection   . Thoracic spondylosis 12/06/2010   Noted on CT  . Uterine cancer 2014   Mirena IUD  . UTI (urinary tract infection) 12/13/2018  . Vitamin B 12 deficiency   . Vitamin D deficiency 05/10/2007   Qualifier: Diagnosis of  By: Wynona Luna    Past Surgical History:  Past Surgical History:  Procedure Laterality Date  . AMPUTATION Right 05/18/2014   Procedure: RIGHT FIFTH RAY AMPUTATION FOOT;  Surgeon: Wylene Simmer, MD;  Location: [place];  Service: Orthopedics;  Laterality: Right;  . APPENDECTOMY    . BUNIONECTOMY     bilateral  . COLONOSCOPY    .  COLONOSCOPY WITH PROPOFOL N/A 02/02/2014   Procedure: COLONOSCOPY WITH PROPOFOL;  Surgeon: Irene Shipper, MD;  Location: [facility];  Service: Endoscopy;  Laterality: N/A;  . cyst on ovary removed     . CYSTOSCOPY W/ RETROGRADES  03/03/2012   Procedure: CYSTOSCOPY WITH RETROGRADE PYELOGRAM;  Surgeon: Bernestine Amass, MD;  Location: [facility];  Service: Urology;  Laterality: Bilateral;  . CYSTOSCOPY W/ URETERAL STENT PLACEMENT Right 11/25/2017   Procedure: CYSTOSCOPY WITH RETROGRADE RIGHT URETERAL STENT PLACEMENT;  Surgeon: Franchot Gallo, MD;  Location: [place];  Service: Urology;  Laterality: Right;  . CYSTOSCOPY W/ URETERAL STENT PLACEMENT Right 01/15/2018   Procedure: RIGHT STENT EXCHANGE;  Surgeon: Ardis Hughs, MD;  Location:  [facility];  Service: Urology;  Laterality: Right;  . CYSTOSCOPY WITH RETROGRADE PYELOGRAM, URETEROSCOPY AND STENT PLACEMENT Left 08/25/2012   Procedure: CYSTOSCOPY WITH RETROGRADE PYELOGRAM, URETEROSCOPY;  Surgeon: Bernestine Amass, MD;  Location: [facility];  Service: Urology;  Laterality: Left;  . CYSTOSCOPY WITH STENT PLACEMENT Left 01/15/2018   Procedure: LEFT STENT PLACEMENT;  Surgeon: Ardis Hughs, MD;  Location: [facility];  Service: Urology;  Laterality: Left;  . CYSTOSCOPY WITH STENT PLACEMENT Right 01/22/2018   Procedure: RIGHT STENT REMOVAL;  Surgeon: Ardis Hughs, MD;  Location: [facility];  Service: Urology;  Laterality: Right;  . CYSTOSCOPY/URETEROSCOPY/HOLMIUM LASER/STENT PLACEMENT Right 12/23/2017   Procedure: RIGHT URETEROSCOPY STONE REMOVAL, HOLMIUM LASER, RIGHT /STENT EXCHANGE;  Surgeon: Ardis Hughs, MD;  Location: [facility];  Service: Urology;  Laterality: Right;  . CYSTOSCOPY/URETEROSCOPY/HOLMIUM LASER/STENT PLACEMENT Left 01/22/2018   Procedure: LEFT URETEROSCOPY STONE REMOVAL HOLMIUM LASER LEFT STENT Freddi Starr;  Surgeon: Ardis Hughs, MD;  Location: [facility];  Service: Urology;  Laterality: Left;  . DILATION AND CURETTAGE OF UTERUS N/A 02/21/2013   Procedure: DILATATION AND CURETTAGE;  Surgeon: Lyman Speller, MD;  Location: [facility];  Service: Gynecology;  Laterality: N/A;  . DILATION AND CURETTAGE OF UTERUS N/A 04/09/2015   Procedure: Perry IUD removal;  Surgeon: Megan Salon, MD;  Location: [facility];  Service: Gynecology;  Laterality: N/A;  Patient weight 307lbs  . ESOPHAGOGASTRODUODENOSCOPY (EGD) WITH PROPOFOL N/A 02/02/2014   Procedure: ESOPHAGOGASTRODUODENOSCOPY (EGD) WITH PROPOFOL;  Surgeon: Irene Shipper, MD;  Location: [facility];  Service: Endoscopy;  Laterality: N/A;  . GASTRIC BYPASS  1974  . HOLMIUM LASER APPLICATION Left 0000000   Procedure: HOLMIUM LASER APPLICATION;  Surgeon: Bernestine Amass, MD;  Location: [facility];   Service: Urology;  Laterality: Left;  . LITHOTRIPSY  03/2012  . LITHOTRIPSY  2/16  . THYROIDECTOMY  05/15/2010  . TONSILLECTOMY AND ADENOIDECTOMY    . UPPER GASTROINTESTINAL ENDOSCOPY    . URETEROSCOPY  03/03/2012   Procedure: URETEROSCOPY;  Surgeon: Bernestine Amass, MD;  Location: [facility];  Service: Urology;  Laterality: Left;  . URETEROSCOPY WITH HOLMIUM LASER LITHOTRIPSY Bilateral 01/15/2018   Procedure: CYSTOSCOPY/BILATERAL URETEROSCOPY WITH HOLMIUM LASER LITHOTRIPSY STONE REMOVAL;  Surgeon: Ardis Hughs, MD;  Location: [facility];  Service: Urology;  Laterality: Bilateral;   HPI:  SABRENIA KRIEBEL is a 64 y.o. female with medical history significant of paroxysmal A. fib, chronic diastolic CHF, COPD, history of thyroidectomy on Synthroid, GERD, hypertension, asthma, anxiety, depression, morbid obesity (BMI 41.19) presenting to the ED via EMS for evaluation of generalized weakness and altered mental status. She has not been eating much for the past few days.  Patient has also complained of choking to family when eating dry  items such as toast. MRI was negative for acute abnormality and CXR was unremarkable.    Assessment / Plan / Recommendation Clinical Impression  Pt was seen for a bedside swallow evaluation.  Pt encountered awake/alert with family present at bedside.  She appeared to be pleasantly confused.  Sister reported that the pt had recently started to cough/choke with dry solids such as toast.  No previous hx of dysphagia and pt typically tolerates regular solids and thin liquids without difficulty per pt and family report.  RN reported that pt had difficulty consuming whole medications this morning.  Oral mechanism exam was unremarkable.  Pt consumed trials of thin liquid, nectar-thick liquid, puree, and regular solids.  Pt exhibited intermittent audible swallows and delayed coughing/throat clearing with thin liquid and regular solids.  Coughing/throat clearing was not consistent.   Delayed throat clear was observed following 1/5 trials of nectar-thick liquid and following 3/7 trials of thin liquid.  Suspect esophageal etiology; however, unable to r/o laryngeal penetration or aspiration.  Suspect prolonged AP transport with puree and regular solids and delayed swallow initiation with all trials.  Recommend initiation of Dysphagia 3 (soft) solids and nectar-thick liquids with medications administered crushed in puree.  SLP will f/u to monitor diet tolerance and to determine readiness for clinical diet upgrade vs instrumental swallow study.    SLP Visit Diagnosis: Dysphagia, unspecified (R13.10)    Aspiration Risk  Mild aspiration risk    Diet Recommendation Dysphagia 3 (Mech soft);Nectar-thick liquid   Liquid Administration via: Cup;Straw Medication Administration: Crushed with puree Supervision: Patient able to self feed;Full supervision/cueing for compensatory strategies Compensations: Minimize environmental distractions;Slow rate;Small sips/bites Postural Changes: Seated upright at 90 degrees    Other  Recommendations Oral Care Recommendations: Oral care BID Other Recommendations: Order thickener from pharmacy;Remove water pitcher   Follow up Recommendations Other (comment)(TBD)      Frequency and Duration min 2x/week  2 weeks       Prognosis Prognosis for Safe Diet Advancement: Good Barriers to Reach Goals: Other (Comment)(AMS)      Swallow Study   General HPI: JAMILYA MANALILI is a 64 y.o. female with medical history significant of paroxysmal A. fib, chronic diastolic CHF, COPD, history of thyroidectomy on Synthroid, GERD, hypertension, asthma, anxiety, depression, morbid obesity (BMI 41.19) presenting to the ED via EMS for evaluation of generalized weakness and altered mental status. She has not been eating much for the past few days.  Patient has also complained of choking to family when eating dry items such as toast. MRI was negative for acute abnormality  and CXR was unremarkable.  Type of Study: Bedside Swallow Evaluation Previous Swallow Assessment: None Diet Prior to this Study: NPO Temperature Spikes Noted: No Respiratory Status: Room air History of Recent Intubation: No Behavior/Cognition: Alert;Cooperative;Pleasant mood;Confused Oral Cavity Assessment: Within Functional Limits Oral Care Completed by SLP: No Oral Cavity - Dentition: Adequate natural dentition Vision: Functional for self-feeding Self-Feeding Abilities: Able to feed self;Needs set up Patient Positioning: Upright in bed Baseline Vocal Quality: Normal Volitional Cough: Strong Volitional Swallow: Able to elicit    Oral/Motor/Sensory Function Overall Oral Motor/Sensory Function: Within functional limits   Ice Chips Ice chips: Not tested   Thin Liquid Thin Liquid: Impaired Presentation: Spoon;Straw;Cup Pharyngeal  Phase Impairments: Suspected delayed Swallow;Throat Clearing - Delayed;Cough - Delayed    Nectar Thick Nectar Thick Liquid: Impaired Presentation: Cup;Straw Pharyngeal Phase Impairments: Suspected delayed Swallow;Throat Clearing - Delayed   Honey Thick Honey Thick Liquid: Not tested   Puree  Puree: Within functional limits Presentation: Spoon   Solid     Solid: Impaired Presentation: Self Fed Oral Phase Impairments: Impaired mastication Oral Phase Functional Implications: Impaired mastication;Oral residue;Prolonged oral transit Pharyngeal Phase Impairments: Cough - Delayed     Colin Mulders M.S., CCC-SLP Acute Rehabilitation Services Office: [phone]  Elvia Collum Chessie Neuharth 10/15/2019,12:44 PM

## 2019-10-15 NOTE — Consult Note (Signed)
[place] [phone]   Fax:([phone] [place] NOTE  Patient Care Team: Debbrah Alar, NP as PCP - General (Internal Medicine) Stanford Breed Denice Bors, MD as PCP - Cardiology (Cardiology) Deneise Lever, MD (Pulmonary Disease) Stanford Breed Denice Bors, MD (Cardiology) Delrae Rend, MD (Internal Medicine) Trula Slade, DPM as Consulting Physician (Podiatry) Marin Olp Rudell Cobb, MD as Consulting Physician (Oncology)  Hematological/Oncological History # Thrombocytopenia # Normocytic Anemia 1) 08/29/2019: WBC 6.7, Hgb 8.9, MCV 90.6, Plt 175. Patient received IV iron sucrose on 4/9, 4/16 and 4/22.  2) 10/14/2019: day of admission: WBC 5.9, Hgb 7.8, Plt 43, INR 2.2 3) 10/15/2019: WBC 4.7, Hgb 7.2, MCV 94.6, Plt 30   PURPOSE OF CONSULTATION:  "Thrombocytopenia/Anemia "  HISTORY OF PRESENTING ILLNESS:  [name] 64 y.o. female with medical history significant for atrial fibrillation, COPD, morbid obesity s/p gastric bipass, thyroidectomy induced hypothyroidism and pernicious anemia who presented with urosepsis and was found to have worsening thrombocytopenia.   On review of the previous records [name] presented to [facility] ED on 10/14/2019 with 4 days of generalized weakness and inability to ambulate.  The patient had become so weak that she was unable to transfer from wheelchair to the car.  During this time.  The patient had issues with urinary incontinence and dysuria.  There was also reports of altered mental status and visual hallucinations.  On physical exam she was noted to have significant bilateral lower extremity edema and progressively worsening abdominal distention.  On arrival to the emergency department the patient was found to be hypothermic with a temperature of 93.1 F.  She was hypotensive, though she was not tachycardic tachypneic.  Her hemoglobin was 7.8 which is below her baseline of approximately 9.  Urinalysis in the  emergency department showed positive nitrite, moderate leukocytes, and greater than 50 white blood cells with many bacteria.  Due to concern for urosepsis the patient was admitted to the hospital for further examination.  On admission the patient was noted to have a platelet count of 43 on 10/14/2019.  Her platelet count has declined during her admission and today she is found to have a platelet count of 25.  On exam today the patient was deeply asleep upon arrival and accompanied by her niece.  Most of the information was provided by her niece as the patient was resting peacefully.  Niece reports that her aunt had a fall yesterday while attempting to transfer from car to wheelchair to get her back into the house.  She notes that the patient frequently gets urinary tract infections but does not inform others until she becomes septic and requires hospitalization.  The patient has been confused over the last several days, but the patient niece reports that there has been a chronic decline in her mental status over at least the last year and a half.  The patient does have issues with extensive bruising and occasional bleeding due to Eliquis therapy.  And ROS was not performed due to patient status.  MEDICAL HISTORY:  Past Medical History:  Diagnosis Date  . Abnormal Pap smear    years ago/no biopsy  . Allergic rhinitis   . Aortic atherosclerosis   . Arthritis    back- lower  . Asthma   . Atrial fibrillation 12/2010   OFF XARELTO LAST MONTH DUE TO BLEEDING IN STOOL  . Bursitis of hip left  . Chronic anticoagulation 12/16/2018   CHADS VASC=2 for sex and H/O HTN- she  is on Eliquis  . Chronic diastolic (congestive) heart failure    pt unaware of this  . COPD (chronic obstructive pulmonary disease) with chronic bronchitis   . Decreased pulses in feet   . Dysrhythmia    afib, followed by Dr. Stanford Breed   . Edema of both legs   . Endometrial adenocarcinoma 02/21/2013   S/p D and C, has mirena, being  followed by GYN   . Esophagitis 02/02/2014   Distal, linear erosions, noted on endoscopy  . Essential hypertension 01/05/2007   Echo Nov June 2020- EF 50-55%, mild LVH, normal LA   . Fatty liver 01/07/2012  . Fibroid 1974   fibroid cyst on left fallopian tube  . Fibromyalgia   . Foot ulcer    AREA HEALED RIGHT FOOT  . Generalized anxiety disorder   . GERD (gastroesophageal reflux disease)   . Headache    occasional sinus headache   . Hepatomegaly   . History of blood transfusion JULY 2015  . History of cardiomegaly 12/06/2010   Noted on CT  . History of E. coli septicemia   . History of papillary thyroid carcinoma 04/07/2011  . Hypocalcemia 12/10/2016  . Hypokalemia 12/10/2016  . Hypoparathyroidism 01/07/2011  . Hypothyroidism   . Iron deficiency anemia secondary to malabsorption  05/10/2007   Qualifier: Diagnosis of  By: Wynona Luna    . Kidney stone 10-18-15   passed on their own  . Leukocytosis 03/24/2009   cta cheQualifier: Diagnosis of  By: Wynona Luna    . Major depressive disorder   . Mild neurocognitive disorder 06/28/2019  . Morbid obesity   . MRSA infection   . Nephrolithiasis 2/16, 9/16   SEES DR Risa Grill  . Osteomyelitis 06/14/2014  . PAF (paroxysmal atrial fibrillation) 02/05/2011   Documented 2012- NSR since.  On chronic anticoagulation.  . Pernicious anemia 02/20/2014   followed by Debbrah Alar  . Pneumonia   . PONV (postoperative nausea and vomiting)   . Pyelonephritis 11/24/2018  . Rash    thighs and back  . Seizures    infancy secondary to fever  . Staph aureus infection   . Thoracic spondylosis 12/06/2010   Noted on CT  . Uterine cancer 2014   Mirena IUD  . UTI (urinary tract infection) 12/13/2018  . Vitamin B 12 deficiency   . Vitamin D deficiency 05/10/2007   Qualifier: Diagnosis of  By: Wynona Luna     SURGICAL HISTORY: Past Surgical History:  Procedure Laterality Date  . AMPUTATION Right 05/18/2014   Procedure: RIGHT  FIFTH RAY AMPUTATION FOOT;  Surgeon: Wylene Simmer, MD;  Location: [place];  Service: Orthopedics;  Laterality: Right;  . APPENDECTOMY    . BUNIONECTOMY     bilateral  . COLONOSCOPY    . COLONOSCOPY WITH PROPOFOL N/A 02/02/2014   Procedure: COLONOSCOPY WITH PROPOFOL;  Surgeon: Irene Shipper, MD;  Location: [facility];  Service: Endoscopy;  Laterality: N/A;  . cyst on ovary removed     . CYSTOSCOPY W/ RETROGRADES  03/03/2012   Procedure: CYSTOSCOPY WITH RETROGRADE PYELOGRAM;  Surgeon: Bernestine Amass, MD;  Location: [facility];  Service: Urology;  Laterality: Bilateral;  . CYSTOSCOPY W/ URETERAL STENT PLACEMENT Right 11/25/2017   Procedure: CYSTOSCOPY WITH RETROGRADE RIGHT URETERAL STENT PLACEMENT;  Surgeon: Franchot Gallo, MD;  Location: [place];  Service: Urology;  Laterality: Right;  . CYSTOSCOPY W/ URETERAL STENT PLACEMENT Right 01/15/2018   Procedure: RIGHT STENT EXCHANGE;  Surgeon: Louis Meckel,  Viona Gilmore, MD;  Location: [facility];  Service: Urology;  Laterality: Right;  . CYSTOSCOPY WITH RETROGRADE PYELOGRAM, URETEROSCOPY AND STENT PLACEMENT Left 08/25/2012   Procedure: CYSTOSCOPY WITH RETROGRADE PYELOGRAM, URETEROSCOPY;  Surgeon: Bernestine Amass, MD;  Location: [facility];  Service: Urology;  Laterality: Left;  . CYSTOSCOPY WITH STENT PLACEMENT Left 01/15/2018   Procedure: LEFT STENT PLACEMENT;  Surgeon: Ardis Hughs, MD;  Location: [facility];  Service: Urology;  Laterality: Left;  . CYSTOSCOPY WITH STENT PLACEMENT Right 01/22/2018   Procedure: RIGHT STENT REMOVAL;  Surgeon: Ardis Hughs, MD;  Location: [facility];  Service: Urology;  Laterality: Right;  . CYSTOSCOPY/URETEROSCOPY/HOLMIUM LASER/STENT PLACEMENT Right 12/23/2017   Procedure: RIGHT URETEROSCOPY STONE REMOVAL, HOLMIUM LASER, RIGHT /STENT EXCHANGE;  Surgeon: Ardis Hughs, MD;  [facility]: [facility];  Service: Urology;  Laterality: Right;  . CYSTOSCOPY/URETEROSCOPY/HOLMIUM LASER/STENT PLACEMENT Left 01/22/2018   Procedure: LEFT URETEROSCOPY STONE  REMOVAL HOLMIUM LASER LEFT STENT Freddi Starr;  Surgeon: Ardis Hughs, MD;  Location: [facility];  Service: Urology;  Laterality: Left;  . DILATION AND CURETTAGE OF UTERUS N/A 02/21/2013   Procedure: DILATATION AND CURETTAGE;  Surgeon: Lyman Speller, MD;  Location: [facility];  Service: Gynecology;  Laterality: N/A;  . DILATION AND CURETTAGE OF UTERUS N/A 04/09/2015   Procedure: [place] IUD removal;  Surgeon: Megan Salon, MD;  Location: [facility];  Service: Gynecology;  Laterality: N/A;  Patient weight 307lbs  . ESOPHAGOGASTRODUODENOSCOPY (EGD) WITH PROPOFOL N/A 02/02/2014   Procedure: ESOPHAGOGASTRODUODENOSCOPY (EGD) WITH PROPOFOL;  Surgeon: Irene Shipper, MD;  Location[facility];  Service: Endoscopy;  Laterality: N/A;  . GASTRIC BYPASS  1974  . HOLMIUM LASER APPLICATION Left 12/28/6312   Procedure: HOLMIUM LASER APPLICATION;  Surgeon: Bernestine Amass, MD;  Location: [facility];  Service: Urology;  Laterality: Left;  . LITHOTRIPSY  03/2012  . LITHOTRIPSY  2/16  . THYROIDECTOMY  05/15/2010  . TONSILLECTOMY AND ADENOIDECTOMY    . UPPER GASTROINTESTINAL ENDOSCOPY    . URETEROSCOPY  03/03/2012   Procedure: URETEROSCOPY;  Surgeon: Bernestine Amass, MD;  Location: [facility];  Service: Urology;  Laterality: Left;  . URETEROSCOPY WITH HOLMIUM LASER LITHOTRIPSY Bilateral 01/15/2018   Procedure: CYSTOSCOPY/BILATERAL URETEROSCOPY WITH HOLMIUM LASER LITHOTRIPSY STONE REMOVAL;  Surgeon: Ardis Hughs, MD;  Location: [facility];  Service: Urology;  Laterality: Bilateral;    SOCIAL HISTORY: Social History   Socioeconomic History  . Marital status: Single    Spouse name: Not on file  . Number of children: 0  . Years of education: 50  . Highest education level: Some college, no degree  Occupational History  . Occupation: works in Insurance claims handler  . Occupation: DESIGN COMPUTER CHIP    Employer: [employer]  Tobacco Use  . Smoking status: Former Smoker     Packs/day: 0.50    Years: 25.00    Pack years: 12.50    Types: Cigarettes    Start date: 12/24/1970    Quit date: 05/27/1995    Years since quitting: 24.4  . Smokeless tobacco: Never Used  Substance and Sexual Activity  . Alcohol use: Yes    Alcohol/week: 0.0 standard drinks    Comment: <12 oz per month  . Drug use: No  . Sexual activity: Yes    Partners: Male    Birth control/protection: Post-menopausal  Other Topics Concern  . Not on file  Social History Narrative   Occupation: works in Insurance claims handler - Field seismologist  Single       Former Smoker - quit tobacco 12 years ago.  She was light smoker for 10 years.             College edu      Right handed      Lives alone in one story home. Has steps to enter home.      Social Determinants of Health   Financial Resource Strain:   . Difficulty of Paying Living Expenses:   Food Insecurity:   . Worried About Charity fundraiser in the Last Year:   . Arboriculturist in the Last Year:   Transportation Needs:   . Film/video editor (Medical):   [name] Lack of Transportation (Non-Medical):   Physical Activity:   . Days of Exercise per Week:   . Minutes of Exercise per Session:   Stress:   . Feeling of Stress :   Social Connections:   . Frequency of Communication with Friends and Family:   . Frequency of Social Gatherings with Friends and Family:   . Attends Religious Services:   . Active Member of Clubs or Organizations:   . Attends Archivist Meetings:   [name] Marital Status:   Intimate Partner Violence:   . Fear of Current or Ex-Partner:   . Emotionally Abused:   [name] Physically Abused:   . Sexually Abused:     FAMILY HISTORY: Family History  Problem Relation Age of Onset  . Hypertension Father   . Diabetes Father   . Lung cancer Father   . Heart attack Father        MI at age 90  . Hypertension Mother   . Hyperthyroidism Mother   . Heart disease Mother   . Heart attack Mother        MI at age  71  . Asthma Brother   . Hypertension Brother        younger  . Heart disease Brother        older  . Dementia Paternal Aunt        Unspecified type; symptoms emerged with advanced age  . Colon cancer Neg Hx   . Esophageal cancer Neg Hx   . Stomach cancer Neg Hx   . Kidney disease Neg Hx   . Liver disease Neg Hx   . Pancreatic cancer Neg Hx     ALLERGIES:  is allergic to nutritional supplements; other; amoxicillin-pot clavulanate; ciprofloxacin; food; keflex [cephalexin]; penicillins; rocephin [ceftriaxone]; and tomato.  MEDICATIONS:  Current Facility-Administered Medications  Medication Dose Route Frequency Provider Last Rate Last Admin  . acetaminophen (TYLENOL) tablet 650 mg  650 mg Oral Q6H PRN Shela Leff, MD       Or  . acetaminophen (TYLENOL) suppository 650 mg  650 mg Rectal Q6H PRN Shela Leff, MD      . albuterol (PROVENTIL) (2.5 MG/3ML) 0.083% nebulizer solution 2.5 mg  2.5 mg Nebulization Q6H PRN Shela Leff, MD      . calcitRIOL (ROCALTROL) capsule 2 mcg  2 mcg Oral Daily Shela Leff, MD   2 mcg at 10/15/19 1025  . ceFEPIme (MAXIPIME) 2 g in sodium chloride 0.9 % 100 mL IVPB  2 g Intravenous Q24H Shela Leff, MD 200 mL/hr at 10/15/19 1131 2 g at 10/15/19 1131  . diclofenac Sodium (VOLTAREN) 1 % topical gel 2 g  2 g Topical QID Shela Leff, MD   2 g at 10/15/19 1132  . levothyroxine (SYNTHROID) tablet 400  mcg  400 mcg Oral [id number] Shela Leff, MD   400 mcg at 10/15/19 [phone]  . loratadine (CLARITIN) tablet 10 mg  10 mg Oral Suzette Battiest, MD   10 mg at 10/15/19 0655  . MEDLINE mouth rinse  15 mL Mouth Rinse BID Patrecia Pour, MD      . mirabegron [facility]) tablet 50 mg  50 mg Oral Daily Shela Leff, MD   50 mg at 10/15/19 1034  . mometasone-formoterol (DULERA) 200-5 MCG/ACT inhaler 2 puff  2 puff Inhalation BID Shela Leff, MD   2 puff at 10/15/19 [phone]  . pantoprazole (PROTONIX) EC tablet 40 mg   40 mg Oral BID Shela Leff, MD   40 mg at 10/15/19 1035  . polyethylene glycol (MIRALAX / GLYCOLAX) packet 17 g  17 g Oral Daily Shela Leff, MD   17 g at 10/15/19 1036  . Resource ThickenUp Clear   Oral PRN Patrecia Pour, MD      . traMADol Veatrice Bourbon) tablet 50 mg  50 mg Oral Q6H PRN Shela Leff, MD   50 mg at 10/15/19 1131  . [START ON 10/17/2019] Vitamin D (Ergocalciferol) (DRISDOL) capsule 50,000 Units  50,000 Units Oral Once per day on Mon Wed Fri Rathore, Wandra Feinstein, MD      . zafirlukast (ACCOLATE) tablet 20 mg  20 mg Oral BID AC Shela Leff, MD        REVIEW OF SYSTEMS:   Unable to obtain.  PHYSICAL EXAMINATION:  Vitals:   10/15/19 0824 10/15/19 1204  BP:  125/76  Pulse:  65  Resp: 16 14  Temp: 98.1 F (36.7 C) (!) 96.3 F (35.7 C)  SpO2:  98%   Filed Weights   10/14/19 1300 10/15/19 0111  Weight: 239 lb 15.9 oz (108.9 kg) 244 lb 11.4 oz (111 kg)    GENERAL: chronically ill appearing middle aged Caucasian female, appears older than stated age.  SKIN: skin color, texture, turgor are normal, no rashes or significant lesions EYES: conjunctiva are pink and non-injected, sclera clear LUNGS: clear to auscultation and percussion with normal breathing effort HEART: regular rate & rhythm and no murmurs. +2 pitting edema bilaterally.  Musculoskeletal: no cyanosis of digits and no clubbing  PSYCH: alert & oriented x 3, fluent speech NEURO: no focal motor/sensory deficits  LABORATORY DATA:  I have reviewed the data as listed CBC Latest Ref Rng & Units 10/15/2019 10/15/2019 10/14/2019  WBC 4.0 - 10.5 K/uL - 4.7 5.2  Hemoglobin 12.0 - 15.0 g/dL - 7.2(L) 7.3(L)  Hematocrit 36.0 - 46.0 % - 24.6(L) 25.3(L)  Platelets 150 - 400 K/uL 25(LL) 30(L) 28(LL)    CMP Latest Ref Rng & Units 10/14/2019 10/14/2019 08/29/2019  Glucose 70 - 99 mg/dL - 85 90  BUN 8 - 23 mg/dL - 70(H) 48(H)  Creatinine 0.44 - 1.00 mg/dL - 2.06(H) 1.41(H)  Sodium 135 - 145 mmol/L 138 141 138    Potassium 3.5 - 5.1 mmol/L 3.8 4.1 3.5  Chloride 98 - 111 mmol/L - 105 99  CO2 22 - 32 mmol/L - 18(L) 28  Calcium 8.9 - 10.3 mg/dL - 8.2(L) 10.4(H)  Total Protein 6.5 - 8.1 g/dL - 6.4(L) 7.2  Total Bilirubin 0.3 - 1.2 mg/dL - 1.4(H) 0.4  Alkaline Phos 38 - 126 U/L - 125 135(H)  AST 15 - 41 U/L - 21 18  ALT 0 - 44 U/L - 24 27    BLOOD FILM: Review of the peripheral blood smear showed  normal appearing white cells with neutrophils that were appropriately lobated and granulated. There was no predominance of bi-lobed or hyper-segmented neutrophils appreciated. No Dohle bodies were noted. There was no left shifting, immature forms or blasts noted. Lymphocytes remain normal in size without any predominance of large granular lymphocytes. Red cells show no anisopoikilocytosis, macrocytes , microcytes or polychromasia. There were no schistocytes, target cells, echinocytes, acanthocytes, dacrocytes, or stomatocytes.There was no rouleaux formation, nucleated red cells, or intra-cellular inclusions noted. The platelets are normal in size, shape, and color without any clumping evident.  RADIOGRAPHIC STUDIES: DG Knee 1-2 Views Right  Result Date: 10/14/2019 CLINICAL DATA:  Knee pain EXAM: RIGHT KNEE - 1-2 VIEW COMPARISON:  06/30/2018 FINDINGS: There are advanced tricompartmental degenerative changes. There is a large joint effusion, new from prior study. There is no definite acute displaced fracture or dislocation. IMPRESSION: 1. Large joint effusion without evidence for an acute displaced fracture or dislocation. If there is high clinical suspicion for an occult fracture, follow-up with cross-sectional imaging is recommended. 2. Advanced tricompartmental degenerative changes are noted. Electronically Signed   By: Constance Holster M.D.   On: 10/14/2019 23:07   CT Head Wo Contrast  Result Date: 10/14/2019 CLINICAL DATA:  Altered mental status.  Increasing weakness. EXAM: CT HEAD WITHOUT CONTRAST TECHNIQUE:  Contiguous axial images were obtained from the base of the skull through the vertex without intravenous contrast. COMPARISON:  CT head 11/21/2018.  Cranial MRI 08/12/2019. FINDINGS: Brain: There is no evidence of acute intracranial hemorrhage, mass lesion, brain edema or extra-axial fluid collection. The ventricles and subarachnoid spaces are appropriately sized for age. There is no CT evidence of acute cortical infarction. Vascular: Mild intracranial vascular calcifications. No hyperdense vessel identified. Skull: Negative for fracture or focal lesion. Calvarial hyperostosis again noted. Sinuses/Orbits: The visualized paranasal sinuses and mastoid air cells are clear. No orbital abnormalities are seen. Other: None. IMPRESSION: Stable head CT. No acute intracranial findings. Electronically Signed   By: Richardean Sale M.D.   On: 10/14/2019 18:56   DG Chest Port 1 View  Result Date: 10/14/2019 CLINICAL DATA:  Weakness EXAM: PORTABLE CHEST 1 VIEW COMPARISON:  12/12/2018 FINDINGS: Cardiac shadow is stable. Mild aortic calcifications are seen. The lungs are clear bilaterally. No bony abnormality is seen. IMPRESSION: No acute abnormality noted. Electronically Signed   By: Inez Catalina M.D.   On: 10/14/2019 13:10   CT Renal Stone Study  Result Date: 10/14/2019 CLINICAL DATA:  Increasing weakness and altered mental status. Flank pain, kidney stone suspected EXAM: CT ABDOMEN AND PELVIS WITHOUT CONTRAST TECHNIQUE: Multidetector CT imaging of the abdomen and pelvis was performed following the standard protocol without IV contrast. COMPARISON:  Abdominal CT 12/12/2018 FINDINGS: Lower chest: Minor dependent atelectasis. No pleural effusion. Coronary artery calcifications. Normal heart size. Hepatobiliary: Previous low-density attenuation throughout the liver is no longer seen. No evidence of focal hepatic lesion. Liver contour somewhat nodular, also seen on prior. No calcified gallstone or pericholecystic inflammation.  For gene cap in the gallbladder. No biliary dilatation. Pancreas: Parenchymal atrophy. No ductal dilatation or inflammation. Spleen: Again seen splenomegaly, spanning 13.8 cm cranial caudal. No focal abnormality. Adrenals/Urinary Tract: No adrenal nodule. Fat stranding about the left kidney, also seen on prior exam. Mild prominence of the left renal pelvis. There are multiple nonobstructing stones within the left kidney but no obstructing or ureteral calculus. Multiple nonobstructing stones throughout the right kidney. No right hydronephrosis. No right ureteral calculi. No right perinephric stranding. Urinary bladder is distended without wall  thickening or bladder stone. No urethral stones visualized. Stomach/Bowel: Colonic tortuosity. Large volume of stool throughout the colon. No colonic wall thickening. No pericolonic inflammation. No obstruction. Appendix not definitively visualized. No evidence of appendicitis. There is no small bowel obstruction or inflammation. Stomach is decompressed. Vascular/Lymphatic: Moderate aortic atherosclerosis. Bi-iliac atherosclerosis. Coarse calcification causes luminal narrowing of the left common iliac artery. No adenopathy. Reproductive: IUD in the uterus, unusual for age. No adnexal mass. Other: No free air, free fluid, or intra-abdominal fluid collection. Musculoskeletal: Diffuse degenerative change throughout the spine. Scattered bone islands in the pelvis. There are no acute or suspicious osseous abnormalities. IMPRESSION: 1. Bilateral nonobstructing nephrolithiasis. Mild prominence of the left renal pelvis with perinephric edema, similar to prior exam, may be chronic or seen with urinary tract infection. The possibility of recently passed stone is also considered. 2. Large volume of stool throughout the colon, can be seen with constipation. No bowel obstruction or inflammation. 3. Nodular hepatic contour, can be seen with cirrhosis. Mild splenomegaly, unchanged. Aortic  Atherosclerosis (ICD10-I70.0). Electronically Signed   By: Keith Rake M.D.   On: 10/14/2019 19:01    ASSESSMENT & PLAN MARISSAH VANDEMARK 64 y.o. female with medical history significant for atrial fibrillation, COPD, morbid obesity s/p gastric bipass, thyroidectomy induced hypothyroidism and pernicious anemia who presented with urosepsis and was found to have worsening thrombocytopenia.  After review of the prior notes, the imaging, and the prior labs the findings are most consistent with a chronic normocytic anemia and an acute thrombocytopenia secondary to severe sepsis.    Other possible etiologies at this time could include primary bone marrow disorder, pseudothrombocytopenia, liver dysfunction, or nutritional deficiency.  Nutritional deficiency is less likely given the patient's routine supplementation of vitamin B12.  Pseudothrombocytopenia additionally could have been detected in with clumping on film, but we will review the peripheral blood films personally in order to assure this is not the case.  Primary bone marrow disorder could be considered as a diagnosis of all other work-up did not reveal a clear etiology and the platelet count failed to increase with appropriate treatment of her active infection.    Liver dysfunction once again could be a consideration, and this is supported by imaging showing cirrhotic morphology of the liver as well as splenomegaly.  Her elevated INR is also in support of this, however it is highly unlikely that a platelet count will drop from normal to severely thrombocytopenic in approximately 1 months time from chronic liver dysfunction.  Hematology will continue to follow with this patient and in the event the thrombocytopenia worsens or no clear etiology can be discerned I would recommend a bone marrow biopsy.  # Acute Thrombocytopenia # Chronic Normocytic Anemia --findings are most consistent with thrombocytopenia in the setting of severe sepsis --other  possible etiologies include primary bone marrow disorder, pseudothrombocytopenia, liver dysfunction or nutritional deficiency.  --please order a Methylmalonic acid and homocysteine to assure Vitamin b12 levels are replete. Additionally please order a copper level.  --we will review the peripheral blood film to assure no platelet clumping or schistocytes, though low suspicion for thrombotic microangiopathy --hepatic dysfunction could be a consideration for her thrombocytopenia, though typically that is a slow, chronic process and does not result in an acute drop in Plt count. This is supported by the cirrhotic morphology with splenomegaly noted on CT from 5/21 and the elevated INR.  --continue to monitor platelet count daily. Recommend transfusion for Plt <10 or Plt <20 if active bleeding is noted.  Additionally please hold home eliquis while Plt are <50. --if above studies are unrevealing and platelet count does not rebound with antibiotic treatment, can consider a bone marrow biopsy.  --Hematology will continue to follow.   All questions were answered. The patient knows to call the clinic with any problems, questions or concerns.  A total of more than 55 minutes were spent on this encounter and over half of that time was spent on counseling and coordination of care as outlined above.   Jamie Peoples, MD Department of Hematology/Oncology [place] [facility] Phone: [phone] Pager: [phone] Email: [email or web address]  10/15/2019 2:06 PM

## 2019-10-15 NOTE — Evaluation (Signed)
Physical Therapy Evaluation Patient Details Name: [name] MRN: [id number] DOB: [birth year] Today's Date: 10/15/2019   History of Present Illness  64 y.o. female with medical history significant of paroxysmal A. fib, chronic diastolic CHF, COPD, history of thyroidectomy on Synthroid, GERD, hypertension, asthma, anxiety, depression, morbid obesity (BMI 41.19) presenting to the ED via EMS for evaluation of generalized weakness and altered mental status. Pt admitted for UTI.    Clinical Impression  Pt admitted with above diagnosis. PTA pt lived at home, ambulatory household distances with rollator and dependent community mobility in wheelchair. Pt's sister, brother in law and niece assist her as needed with mobility and ADLs. They report a steep decline in mobility/function over the past week with pt being unable to stand or walk. On eval, pt required +2 total assist bed mobility. She tolerated sitting EOB x 5 minutes with mod/max assist to maintain balance. Unable to progress beyond EOB due to pain and weakness.  Pt currently with functional limitations due to the deficits listed below (see PT Problem List). Pt will benefit from skilled PT to increase their independence and safety with mobility to allow discharge to the venue listed below.  Pt adamantly refusing SNF. Pt's sister and niece present in room and confirm they are able to provide 24-hour assist at home. Recommend maximizing home health services upon discharge as well as hoyer lift.     Follow Up Recommendations Home health PT;Supervision/Assistance - 24 hour    Equipment Recommendations  Other (comment)(hoyer lift)    Recommendations for Other Services       Precautions / Restrictions Precautions Precautions: Fall Restrictions Weight Bearing Restrictions: No      Mobility  Bed Mobility Overal bed mobility: Needs Assistance Bed Mobility: Rolling;Supine to Sit;Sit to Supine Rolling: +2 for physical assistance;Max  assist   Supine to sit: Total assist;+2 for physical assistance Sit to supine: Total assist;+2 for physical assistance   General bed mobility comments: Pt required very slow transitions between movements due to pain and fear. Helicopter method using bed pads to transition to EOB.  Transfers                 General transfer comment: unable to progress beyond EOB due to pain and weakness  Ambulation/Gait                Stairs            Wheelchair Mobility    Modified Rankin (Stroke Patients Only)       Balance Overall balance assessment: Needs assistance Sitting-balance support: Feet supported Sitting balance-Leahy Scale: Poor Sitting balance - Comments: mod to max assist to maintain sitting balance EOB Postural control: Posterior lean                                   Pertinent Vitals/Pain Pain Assessment: Faces Faces Pain Scale: Hurts whole lot Pain Location: generalized with all mobility, especially R knee, bilat LEs Pain Descriptors / Indicators: Grimacing;Guarding;Moaning;Sore;Tender Pain Intervention(s): Limited activity within patient's tolerance;Repositioned    Home Living Family/patient expects to be discharged to:: Private residence Living Arrangements: Alone Available Help at Discharge: Family;Available 24 hours/day Type of Home: House Home Access: Ramped entrance     Home Layout: Two level;Able to live on main level with bedroom/bathroom Home Equipment: Walker - 4 wheels;Walker - standard;Grab bars - tub/shower;Tub bench;Wheelchair - manual      Prior Function Level of  Independence: Needs assistance   Gait / Transfers Assistance Needed: Rollator very short household distances. Pt sleeps in lift chair, so typically just ambulates chair to/from bathroom. Family pushes her in a w/c for longer distances.  ADL's / Homemaking Assistance Needed: family assists with ADLs as needed. Has been unable to get in the shower x 1  week.        Hand Dominance   Dominant Hand: Right    Extremity/Trunk Assessment   Upper Extremity Assessment Upper Extremity Assessment: Defer to OT evaluation    Lower Extremity Assessment Lower Extremity Assessment: RLE deficits/detail;LLE deficits/detail;Generalized weakness RLE Deficits / Details: edema noted distally RLE: Unable to fully assess due to pain LLE Deficits / Details: R knee swollen and painful. Edema noted distally. LLE: Unable to fully assess due to pain       Communication   Communication: No difficulties  Cognition Arousal/Alertness: Awake/alert Behavior During Therapy: Anxious Overall Cognitive Status: Impaired/Different from baseline Area of Impairment: Following commands;Problem solving;Awareness                       Following Commands: Follows one step commands with increased time   Awareness: Emergent Problem Solving: Difficulty sequencing;Requires verbal cues General Comments: Pt very anxious/nervous regarding mobility. Mild confusion noted.      General Comments General comments (skin integrity, edema, etc.): BP stable sitting EOB. Of note hgb 7.2 at time of eval. Pt with c/o mild dizziness upon sitting.    Exercises     Assessment/Plan    PT Assessment Patient needs continued PT services  PT Problem List Decreased strength;Decreased mobility;Decreased activity tolerance;Decreased balance;Pain;Obesity       PT Treatment Interventions DME instruction;Therapeutic activities;Therapeutic exercise;Patient/family education;Gait training;Balance training;Functional mobility training    PT Goals (Current goals can be found in the Care Plan section)  Acute Rehab PT Goals Patient Stated Goal: home PT Goal Formulation: With patient/family Time For Goal Achievement: 10/29/19 Potential to Achieve Goals: Fair    Frequency Min 3X/week   Barriers to discharge        Co-evaluation               AM-PAC PT "6 Clicks"  Mobility  Outcome Measure Help needed turning from your back to your side while in a flat bed without using bedrails?: Total Help needed moving from lying on your back to sitting on the side of a flat bed without using bedrails?: Total Help needed moving to and from a bed to a chair (including a wheelchair)?: Total Help needed standing up from a chair using your arms (e.g., wheelchair or bedside chair)?: Total Help needed to walk in hospital room?: Total Help needed climbing 3-5 steps with a railing? : Total 6 Click Score: 6    End of Session   Activity Tolerance: Patient limited by pain Patient left: in bed;with call bell/phone within reach;with nursing/sitter in room;with family/visitor present Nurse Communication: Mobility status PT Visit Diagnosis: Other abnormalities of gait and mobility (R26.89);Pain;Muscle weakness (generalized) (M62.81)    Time: [id number] PT Time Calculation (min) (ACUTE ONLY): 26 min   Charges:   PT Evaluation $PT Eval Moderate Complexity: 1 Mod PT Treatments $Therapeutic Activity: 8-22 mins        Lorrin Goodell, PT  Office # [phone] Pager [phone]   Lorriane Shire 10/15/2019, 12:18 PM

## 2019-10-15 NOTE — Progress Notes (Signed)
CRITICAL VALUE ALERT  Critical Value:  Platelets 28  Date & Time Notied:  10/15/2019 0130  Provider Notified: Lovey Newcomer  Orders Received/Actions taken:  Orders made

## 2019-10-16 ENCOUNTER — Inpatient Hospital Stay (HOSPITAL_COMMUNITY): Payer: 59

## 2019-10-16 ENCOUNTER — Encounter (HOSPITAL_COMMUNITY): Payer: Self-pay | Admitting: Internal Medicine

## 2019-10-16 DIAGNOSIS — T68XXXD Hypothermia, subsequent encounter: Secondary | ICD-10-CM

## 2019-10-16 DIAGNOSIS — R7989 Other specified abnormal findings of blood chemistry: Secondary | ICD-10-CM

## 2019-10-16 LAB — HEMOGLOBIN AND HEMATOCRIT, BLOOD
HCT: 25.2 % — ABNORMAL LOW (ref 36.0–46.0)
Hemoglobin: 7.4 g/dL — ABNORMAL LOW (ref 12.0–15.0)

## 2019-10-16 LAB — CBC
HCT: 23.6 % — ABNORMAL LOW (ref 36.0–46.0)
Hemoglobin: 6.5 g/dL — CL (ref 12.0–15.0)
MCH: 27.8 pg (ref 26.0–34.0)
MCHC: 27.5 g/dL — ABNORMAL LOW (ref 30.0–36.0)
MCV: 100.9 fL — ABNORMAL HIGH (ref 80.0–100.0)
Platelets: 46 10*3/uL — ABNORMAL LOW (ref 150–400)
RBC: 2.34 MIL/uL — ABNORMAL LOW (ref 3.87–5.11)
RDW: 18.8 % — ABNORMAL HIGH (ref 11.5–15.5)
WBC: 3.2 10*3/uL — ABNORMAL LOW (ref 4.0–10.5)
nRBC: 0.6 % — ABNORMAL HIGH (ref 0.0–0.2)

## 2019-10-16 LAB — COMPREHENSIVE METABOLIC PANEL
ALT: 22 U/L (ref 0–44)
AST: 25 U/L (ref 15–41)
Albumin: 2 g/dL — ABNORMAL LOW (ref 3.5–5.0)
Alkaline Phosphatase: 105 U/L (ref 38–126)
Anion gap: 11 (ref 5–15)
BUN: 53 mg/dL — ABNORMAL HIGH (ref 8–23)
CO2: 17 mmol/L — ABNORMAL LOW (ref 22–32)
Calcium: 8 mg/dL — ABNORMAL LOW (ref 8.9–10.3)
Chloride: 113 mmol/L — ABNORMAL HIGH (ref 98–111)
Creatinine, Ser: 2.16 mg/dL — ABNORMAL HIGH (ref 0.44–1.00)
GFR calc Af Amer: 28 mL/min — ABNORMAL LOW (ref >60)
GFR calc non Af Amer: 24 mL/min — ABNORMAL LOW (ref >60)
Glucose, Bld: 97 mg/dL (ref 70–99)
Potassium: 3.7 mmol/L (ref 3.5–5.1)
Sodium: 141 mmol/L (ref 135–145)
Total Bilirubin: 0.6 mg/dL (ref 0.3–1.2)
Total Protein: 5.5 g/dL — ABNORMAL LOW (ref 6.5–8.1)

## 2019-10-16 LAB — URINE CULTURE: Culture: >=100000 — AB

## 2019-10-16 LAB — HEPARIN INDUCED PLATELET AB (HIT ANTIBODY): Heparin Induced Plt Ab: 0.181 OD (ref 0.000–0.400)

## 2019-10-16 LAB — PREPARE RBC (CROSSMATCH)

## 2019-10-16 MED ORDER — SODIUM CHLORIDE 0.9% IV SOLUTION: Freq: Once | INTRAVENOUS | Status: DC

## 2019-10-16 NOTE — Progress Notes (Signed)
  Speech Language Pathology Treatment: Dysphagia  Patient Details Name: [name] MRN: [id number] DOB: 02/07/1956 Today's Date: 10/16/2019 Time: [id number] SLP Time Calculation (min) (ACUTE ONLY): 26 min  Assessment / Plan / Recommendation Clinical Impression  Pt was seen for skilled ST targeting diet tolerance and diagnostic treatment.  Pt was encountered awake/alert with sister present at bedside.  Sister reported that the pt was more alert today and that she her confusion was slowly resolving.  Pt had finished her breakfast just prior to SLP arrival and pt/sister reported that the pt tolerated her meal without coughing, choking, or other difficulty.  Pt was seen with trials of nectar-thick liquid via straw sip, thin liquid via cup/straw sip, and regular solids.  Pt exhibited mildly prolonged, but effective mastication of regular solids and no overt s/sx of aspiration were observed with solids or nectar-thick liquid trials.  Pt reported that she preferred to remain on the Dysphagia 3 (soft) diet at this time.  She exhibited an immediate cough following 2/8 trials of thin liquid and she exhibited a delayed cough following 2/8 trials of thin liquid.  Due to continued intermittent s/sx of aspiration with thin liquid, recommend an instrumental swallow study to further evaluate swallow function.  Will plan for MBS tomorrow.  Recommend continuation of Dysphagia 3 (soft) solids and nectar-thick liquids with medications administered crushed in puree until MBS is completed. Spoke with pt and sister regarding all recommendations and they verbalized understanding.     HPI HPI: [name] is a 64 y.o. female with medical history significant of paroxysmal A. fib, chronic diastolic CHF, COPD, history of thyroidectomy on Synthroid, GERD, hypertension, asthma, anxiety, depression, morbid obesity (BMI 41.19) presenting to the ED via EMS for evaluation of generalized weakness and altered mental status. She has  not been eating much for the past few days.  Patient has also complained of choking to family when eating dry items such as toast. MRI was negative for acute abnormality and CXR was unremarkable.       SLP Plan  MBS       Recommendations  Diet recommendations: Dysphagia 3 (mechanical soft);Nectar-thick liquid Liquids provided via: Cup;Straw Medication Administration: Crushed with puree Supervision: Patient able to self feed;Intermittent supervision to cue for compensatory strategies Compensations: Minimize environmental distractions;Slow rate;Small sips/bites Postural Changes and/or Swallow Maneuvers: Seated upright 90 degrees                Oral Care Recommendations: Oral care BID Follow up Recommendations: Other (comment)(TBD) SLP Visit Diagnosis: Dysphagia, unspecified (R13.10) Plan: MBS       GO               Colin Mulders., M.S., [id number] Acute Rehabilitation Services Office: [phone]  [place] 10/16/2019, 9:13 AM

## 2019-10-16 NOTE — Progress Notes (Signed)
Patient Rectal temperature 95.0, Axillary temp was 94.5, Dr. Mal Misty made aware and orders received for Bair hugger, placed on patient on low, MD made aware that blood was currently infusing. Will continue to monitor patient. Lelon Ikard, Bettina Gavia RN

## 2019-10-16 NOTE — Progress Notes (Signed)
Notified by bedside nurse Baxter Flattery RN about pt's MEWS yellow-red for hypotension and hypothermia. HGB also noted to have downtrended to 6.5 from 7.2 yesterday. Ayiku MD paged and made aware of VS and Hgb.

## 2019-10-16 NOTE — Progress Notes (Signed)
VASCULAR LAB PRELIMINARY  PRELIMINARY  PRELIMINARY  PRELIMINARY  Bilateral lower extremity venous duplex completed.    Preliminary report:  See CV proc for preliminary results.  KANADY, CANDACE, RVT 10/16/2019, 4:01 PM

## 2019-10-16 NOTE — Significant Event (Addendum)
Rapid Response Event Note  Overview: "Second Set of Eyes"  Initial Focused Assessment: I came by to see the patient per request of the nurse. Per nurse, patient's temperature has been low, BPs have been soft, and her hemoglobin dropped to 6.5 this morning. When I arrived, patient was already on bair-hugger, she was alert, conversant, and confused (ongoing - not an acute change). She could be reoriented easily, she followed commands, and was able to move all extremities equally. BP 108/58 (75), HR 64, RR 12, 96% on RA - lung sounds - clear on fields, abdomen soft and non-tender. Patient endorses having ongoing back discomfort and asked that we reposition her. Blood transfusion was infusing at 120 cc/hr. Skin cool to touch but good capillary refill and palpable pulses. Trace bruising on extremities - BLE + swelling present.   Interventions: -- No RRT Interventions  Plan of Care: -- Increase blood transfusion rate as patient tolerates -- Monitor VS and trend VS -- Monitor signs of shock -- Rest per MD  Event Summary:  Start Time 1400  End Time 1427   Tyvon Eggenberger R

## 2019-10-16 NOTE — Progress Notes (Signed)
Progress Note    [name]  [id number] DOB: 22-Oct-1955  DOA: 10/14/2019 PCP: Debbrah Alar, NP      Brief Narrative:    Medical records reviewed and are as summarized below:  [name] is an 64 y.o. female with a history of PAF on eliquis, gastric bypass with iron deficiency anemia, hypothyroidism, chronic HFpEF, HTN, COPD/asthma, anxiety, depression and morbid obesity who presented to the ED with increasing confusion and generalized weakness as well as right knee pain after falling on it during a transfer at home. She also complained of dysuria but was confused on arrival. Family has noticed increasing leg swelling and some abdominal distention despite not eating much for a few days. She was hypothermic, hypotensive with hgb 7.8, down from baseline, normocytic, and platelets 43k down from normal previously, creatinine 2 up from 1.4, UA with hematuria, pyuria, bacteriuria, CXR no active disease. CT head without acute abnormality. CT renal stone study showed nonobstructing nephrolithiasis with mild prominence of the left renal pelvis with perinephric edema. CT also showing large volume stool throughout the colon concerning for constipation. No bowel obstruction or inflammation. Liver with nodular hepatic contour, can be seen with cirrhosis. Mild splenomegaly, unchanged. She was given cefepime, 2L LR, tramadol and voltaren gel. Platelets have continued downward trend and hematology is consulted.      Assessment/Plan:   Principal Problem:   UTI (urinary tract infection) Active Problems:   Anemia   Sepsis ([place])   Hypothermia   Thrombocytopenia ([facility])   AKI (acute kidney injury) ([place])  Sepsis secondary to [id number], hypotensive. Cortisol adequate - Continue cefepime (tolerating this despite CTX allergy) - Monitor urine and blood cultures.  Bair hugger as needed for hypothermia.  Thrombocytopenia: Platelet count is worse today.  Possibly related to  sepsis. With mild splenomegaly and known hepatic steatosis with nodular changes ?cirrhosis. Also has INR 2.2, elevated d-dimer and fibrinogen. No schistocytes - Appreciate hematology evaluation - No indication for transfusion at this time, though patient and family consent if felt to be needed.  - Continue monitoring platelets.   Iron deficiency anemia/acute on chronic anemia: Hemoglobin dropped to 6.5.  Transfuse 1 unit of packed red blood cells.  Family said patient had multiple transfusion within the last month or 2.  Monitor H&H.  Risk, benefits and alternatives of blood transfusion were discussed with the patient and family at the bedside.  They are agreeable to blood transfusion.  Elevated d-dimer, leg edema:  - R/o DVT w/LE venous U/S  AKI with NAGMA:BUN 70, creatinine 2.0.Creatinine was 1.4 on 08/29/2019. Elevated BUN argues for prerenal azotemia (vs. upper GI bleed).  - Continue IV fluids - Hold lasix, spironolactone.  - BMP in AM.   Acute toxic metabolic encephalopathy: Likely related to sepsis, AKI, dehydration. No acute abnormality on CT head.   - Delirium precautions.  Continue supportive care  Right knee pain:Knee pain started after her fall. X-ray showing large joint effusion without evidence of an acute displaced fracture or dislocation.  - Continue pain management with Tylenol, Voltaren gel. - With traumatic effusion, consider cross-sectional imaging vs. orthopedics consult if not improving.  Generalized weakness:  - PT/OT  Dysphagia:  - SLP evaluation  Chronic diastolic CHF: Compensated.  BNP wnl, no pulmonary edema, appears volume down.   PAF:  - Holding diltiazem with hypotension and controlled rate (NSR) - Hold eliquis w/severe thrombocytopenia  Hypothyroidism: TSH normal.  - Continue home synthroid 443mcg!  Constipation:CT also showing large volume  stool throughout the colon concerning for constipation. No bowel obstruction or inflammation.   Laxatives as needed.  COPD/asthma: No exacerbation.  - continue BD.     Body mass index is 42 kg/m.  (Morbid obesity)   Family Communication/Anticipated D/C date and plan/Code Status   DVT prophylaxis: SCDs Code Status: Full code Family Communication: Plan discussed with patient and family at the bedside Disposition Plan:    Status is: Inpatient  Remains inpatient appropriate because:IV treatments appropriate due to intensity of illness or inability to take PO and Inpatient level of care appropriate due to severity of illness   Dispo: The patient is from: Home              Anticipated d/c is to: Home              Anticipated d/c date is: 3 days              Patient currently is not medically stable to d/c.  Her brother and sister-in-law requested that they wanted to stay with her around-the-clock.  Family requested that a third family member be added to the visitor's list so that 3 people could rotate in staying with her in the room.  They are concerned that she is confused and it might be unsafe for her if she is left alone in the room.  They feel that it will be easier for 3 people to share the burden of rotation.  Cyril Mourning, RN has been notified to inform charge nurse about their request.         Subjective:   No complaints.  No abdominal pain, vomiting or diarrhea.  Her sister-in-law and brother at the bedside.  According to her family, patient has been confused.  Objective:    Vitals:   10/16/19 1251 10/16/19 1300 10/16/19 1315 10/16/19 1333  BP: (!) 89/49 95/72  (!) 83/37  Pulse: 61 65 68 60  Resp: 11 13 12 10   Temp: (!) 94.5 F (34.7 C)  (!) 95 F (35 C) (!) 94.2 F (34.6 C)  TempSrc: Axillary  Rectal Axillary  SpO2: 100% 100% 99% 98%  Weight:      Height:       No data found.   Intake/Output Summary (Last 24 hours) at 10/16/2019 1354 Last data filed at 10/16/2019 1315 Gross per 24 hour  Intake 262.15 ml  Output 750 ml  Net -487.85 ml    Filed Weights   10/14/19 1300 10/15/19 0111  Weight: 108.9 kg 111 kg    Exam:  GEN: NAD SKIN: No rash EYES: EOMI ENT: MMM CV: RRR PULM: CTA B ABD: soft, obese, NT, +BS CNS: AAO x 2 ( person and place), non focal EXT: No edema or tenderness   Data Reviewed:   I have personally reviewed following labs and imaging studies:  Labs: Labs show the following:   Basic Metabolic Panel: Recent Labs  Lab 10/14/19 1330 10/14/19 1330 10/14/19 1338 10/16/19 0753  NA 141  --  138 141  K 4.1   < > 3.8 3.7  CL 105  --   --  113*  CO2 18*  --   --  17*  GLUCOSE 85  --   --  97  BUN 70*  --   --  53*  CREATININE 2.06*  --   --  2.16*  CALCIUM 8.2*  --   --  8.0*   < > = values in this interval not displayed.   GFR  Estimated Creatinine Clearance: 32.9 mL/min (A) (by C-G formula based on SCr of 2.16 mg/dL (H)). Liver Function Tests: Recent Labs  Lab 10/14/19 1330 10/16/19 0753  AST 21 25  ALT 24 22  ALKPHOS 125 105  BILITOT 1.4* 0.6  PROT 6.4* 5.5*  ALBUMIN 2.5* 2.0*   No results for input(s): LIPASE, AMYLASE in the last 168 hours. No results for input(s): AMMONIA in the last 168 hours. Coagulation profile Recent Labs  Lab 10/14/19 1330 10/15/19 0446  INR 2.2* 2.2*    CBC: Recent Labs  Lab 10/14/19 1330 10/14/19 1338 10/14/19 2311 10/15/19 0446 10/16/19 0753  WBC 5.9  --  5.2 4.7 3.2*  NEUTROABS 5.3  --   --   --   --   HGB 7.8* 8.5* 7.3* 7.2* 6.5*  HCT 26.7* 25.0* 25.3* 24.6* 23.6*  MCV 94.7  --  95.1 94.6 100.9*  PLT 43*  --  28* 25*  30* 46*   Cardiac Enzymes: No results for input(s): CKTOTAL, CKMB, CKMBINDEX, TROPONINI in the last 168 hours. BNP (last 3 results) No results for input(s): PROBNP in the last 8760 hours. CBG: No results for input(s): GLUCAP in the last 168 hours. D-Dimer: Recent Labs    10/15/19 0446  DDIMER 0.59*   Hgb A1c: No results for input(s): HGBA1C in the last 72 hours. Lipid Profile: No results for input(s):  CHOL, HDL, LDLCALC, TRIG, CHOLHDL, LDLDIRECT in the last 72 hours. Thyroid function studies: Recent Labs    10/14/19 1525  TSH 0.398   Anemia work up: Recent Labs    10/15/19 0446  VITAMINB12 1,499*  FOLATE 8.9  FERRITIN 2,328*  TIBC 244*  IRON 35  RETICCTPCT 2.4   Sepsis Labs: Recent Labs  Lab 10/14/19 1330 10/14/19 2311 10/15/19 0446 10/16/19 0753  WBC 5.9 5.2 4.7 3.2*  LATICACIDVEN 1.1  --   --   --     Microbiology Recent Results (from the past 240 hour(s))  Blood Culture (routine x 2)     Status: None (Preliminary result)   Collection Time: 10/14/19  1:20 PM   Specimen: BLOOD  Result Value Ref Range Status   Specimen Description BLOOD RIGHT ANTECUBITAL  Final   Special Requests   Final    BOTTLES DRAWN AEROBIC AND ANAEROBIC Blood Culture results may not be optimal due to an inadequate volume of blood received in culture bottles   Culture   Final    NO GROWTH 2 DAYS Performed at [facility], [place] [address], [place], [place] [address]    Report Status PENDING  Incomplete  Blood Culture (routine x 2)     Status: None (Preliminary result)   Collection Time: 10/14/19  1:25 PM   Specimen: BLOOD RIGHT HAND  Result Value Ref Range Status   Specimen Description BLOOD RIGHT HAND  Final   Special Requests   Final    BOTTLES DRAWN AEROBIC AND ANAEROBIC Blood Culture adequate volume   Culture   Final    NO GROWTH 2 DAYS Performed at [facility], [place] [address], [place], [place] [address]    Report Status PENDING  Incomplete  Urine culture     Status: Abnormal   Collection Time: 10/14/19  3:10 PM   Specimen: In/Out Cath Urine  Result Value Ref Range Status   Specimen Description IN/OUT CATH URINE  Final   Special Requests   Final    NONE Performed at [facility], [place] [address]  [place], [place] [address]    Culture >=100,000 COLONIES/mL ESCHERICHIA COLI (A)  Final   Report Status 10/16/2019 FINAL  Final   Organism ID, Bacteria ESCHERICHIA  COLI (A)  Final      Susceptibility   Escherichia coli - MIC*    AMPICILLIN 4 SENSITIVE Sensitive     CEFAZOLIN <=4 SENSITIVE Sensitive     CEFTRIAXONE <=1 SENSITIVE Sensitive     CIPROFLOXACIN <=0.25 SENSITIVE Sensitive     GENTAMICIN <=1 SENSITIVE Sensitive     IMIPENEM <=0.25 SENSITIVE Sensitive     NITROFURANTOIN <=16 SENSITIVE Sensitive     TRIMETH/SULFA <=20 SENSITIVE Sensitive     AMPICILLIN/SULBACTAM 4 SENSITIVE Sensitive     PIP/TAZO <=4 SENSITIVE Sensitive     * >=100,000 COLONIES/mL ESCHERICHIA COLI  SARS Coronavirus 2 by RT PCR (hospital order, performed in [facility] lab) Nasopharyngeal Nasopharyngeal Swab     Status: None   Collection Time: 10/14/19  5:55 PM   Specimen: Nasopharyngeal Swab  Result Value Ref Range Status   SARS Coronavirus 2 NEGATIVE NEGATIVE Final    Comment: (NOTE) SARS-CoV-2 target nucleic acids are NOT DETECTED. The SARS-CoV-2 RNA is generally detectable in upper and lower respiratory specimens during the acute phase of infection. The lowest concentration of SARS-CoV-2 viral copies this assay can detect is 250 copies / mL. A negative result does not preclude SARS-CoV-2 infection and should not be used as the sole basis for treatment or other patient management decisions.  A negative result may occur with improper specimen collection / handling, submission of specimen other than nasopharyngeal swab, presence of viral mutation(s) within the areas targeted by this assay, and inadequate number of viral copies (<250 copies / mL). A negative result must be combined with clinical observations, patient history, and epidemiological information. Fact Sheet for Patients:   StrictlyIdeas.no Fact Sheet for Healthcare Providers: BankingDealers.co.za This test is not yet approved or cleared  by the [place] FDA and has been authorized for detection and/or diagnosis of SARS-CoV-2 by FDA under an  Emergency Use Authorization (EUA).  This EUA will remain in effect (meaning this test can be used) for the duration of the COVID-19 declaration under Section 564(b)(1) of the Act, 21 U.S.C. section 360bbb-3(b)(1), unless the authorization is terminated or revoked sooner. Performed at [facility], [place] [address], [place], [place] [address]     Procedures and diagnostic studies:  DG Knee 1-2 Views Right  Result Date: 10/14/2019 CLINICAL DATA:  Knee pain EXAM: RIGHT KNEE - 1-2 VIEW COMPARISON:  06/30/2018 FINDINGS: There are advanced tricompartmental degenerative changes. There is a large joint effusion, new from prior study. There is no definite acute displaced fracture or dislocation. IMPRESSION: 1. Large joint effusion without evidence for an acute displaced fracture or dislocation. If there is high clinical suspicion for an occult fracture, follow-up with cross-sectional imaging is recommended. 2. Advanced tricompartmental degenerative changes are noted. Electronically Signed   By: Constance Holster M.D.   On: 10/14/2019 23:07   CT Head Wo Contrast  Result Date: 10/14/2019 CLINICAL DATA:  Altered mental status.  Increasing weakness. EXAM: CT HEAD WITHOUT CONTRAST TECHNIQUE: Contiguous axial images were obtained from the base of the skull through the vertex without intravenous contrast. COMPARISON:  CT head 11/21/2018.  Cranial MRI 08/12/2019. FINDINGS: Brain: There is no evidence of acute intracranial hemorrhage, mass lesion, brain edema or extra-axial fluid collection. The ventricles and subarachnoid spaces are appropriately sized for age. There is no CT evidence of acute  cortical infarction. Vascular: Mild intracranial vascular calcifications. No hyperdense vessel identified. Skull: Negative for fracture or focal lesion. Calvarial hyperostosis again noted. Sinuses/Orbits: The visualized paranasal sinuses and mastoid air cells are clear. No orbital abnormalities are seen. Other: None.  IMPRESSION: Stable head CT. No acute intracranial findings. Electronically Signed   By: Richardean Sale M.D.   On: 10/14/2019 18:56   CT Renal Stone Study  Result Date: 10/14/2019 CLINICAL DATA:  Increasing weakness and altered mental status. Flank pain, kidney stone suspected EXAM: CT ABDOMEN AND PELVIS WITHOUT CONTRAST TECHNIQUE: Multidetector CT imaging of the abdomen and pelvis was performed following the standard protocol without IV contrast. COMPARISON:  Abdominal CT 12/12/2018 FINDINGS: Lower chest: Minor dependent atelectasis. No pleural effusion. Coronary artery calcifications. Normal heart size. Hepatobiliary: Previous low-density attenuation throughout the liver is no longer seen. No evidence of focal hepatic lesion. Liver contour somewhat nodular, also seen on prior. No calcified gallstone or pericholecystic inflammation. For gene cap in the gallbladder. No biliary dilatation. Pancreas: Parenchymal atrophy. No ductal dilatation or inflammation. Spleen: Again seen splenomegaly, spanning 13.8 cm cranial caudal. No focal abnormality. Adrenals/Urinary Tract: No adrenal nodule. Fat stranding about the left kidney, also seen on prior exam. Mild prominence of the left renal pelvis. There are multiple nonobstructing stones within the left kidney but no obstructing or ureteral calculus. Multiple nonobstructing stones throughout the right kidney. No right hydronephrosis. No right ureteral calculi. No right perinephric stranding. Urinary bladder is distended without wall thickening or bladder stone. No urethral stones visualized. Stomach/Bowel: Colonic tortuosity. Large volume of stool throughout the colon. No colonic wall thickening. No pericolonic inflammation. No obstruction. Appendix not definitively visualized. No evidence of appendicitis. There is no small bowel obstruction or inflammation. Stomach is decompressed. Vascular/Lymphatic: Moderate aortic atherosclerosis. Bi-iliac atherosclerosis. Coarse  calcification causes luminal narrowing of the left common iliac artery. No adenopathy. Reproductive: IUD in the uterus, unusual for age. No adnexal mass. Other: No free air, free fluid, or intra-abdominal fluid collection. Musculoskeletal: Diffuse degenerative change throughout the spine. Scattered bone islands in the pelvis. There are no acute or suspicious osseous abnormalities. IMPRESSION: 1. Bilateral nonobstructing nephrolithiasis. Mild prominence of the left renal pelvis with perinephric edema, similar to prior exam, may be chronic or seen with urinary tract infection. The possibility of recently passed stone is also considered. 2. Large volume of stool throughout the colon, can be seen with constipation. No bowel obstruction or inflammation. 3. Nodular hepatic contour, can be seen with cirrhosis. Mild splenomegaly, unchanged. Aortic Atherosclerosis (ICD10-I70.0). Electronically Signed   By: Keith Rake M.D.   On: 10/14/2019 19:01    Medications:   . sodium chloride   Intravenous Once  . calcitRIOL  2 mcg Oral Daily  . diclofenac Sodium  2 g Topical QID  . levothyroxine  400 mcg Oral Q0600  . loratadine  10 mg Oral [id number]  . mouth rinse  15 mL Mouth Rinse BID  . mirabegron ER  50 mg Oral Daily  . mometasone-formoterol  2 puff Inhalation BID  . montelukast  10 mg Oral QHS  . pantoprazole  40 mg Oral BID  . polyethylene glycol  17 g Oral Daily  . senna-docusate  1 tablet Oral Daily  . [START ON 10/17/2019] Vitamin D (Ergocalciferol)  50,000 Units Oral Once per day on Mon Wed Fri   Continuous Infusions: . ceFEPime (MAXIPIME) IV 2 g (10/16/19 0939)  . lactated ringers 100 mL/hr at 10/16/19 0448     LOS: 2 days  Jymir Dunaj  [facility]     10/16/2019, 1:54 PM

## 2019-10-16 NOTE — Evaluation (Signed)
Occupational Therapy Evaluation Patient Details Name: [name] MRN: [id number] DOB: [birth year] Today's Date: 10/16/2019    History of Present Illness 64 y.o. female with medical history significant of paroxysmal A. fib, chronic diastolic CHF, COPD, history of thyroidectomy on Synthroid, GERD, hypertension, asthma, anxiety, depression, morbid obesity (BMI 41.19) presenting to the ED via EMS for evaluation of generalized weakness and altered mental status. Pt admitted for UTI.   Clinical Impression   Pt PTA: Pt performing ADL/IADL with family assist. Pt was ambulating very little with rollator to bathroom and using W/C for log distances. Recently in the past week, pt was increasing weakness requiring increased supervision and assist from family. Pt currently with decreased ability to care for self, decreased activity tolerance and slow/delayed processing/sequencing with hints of disorientation.  Pt assisting to turn to R by reaching with arm for siderail to assist. Pt maxA +2 for bed mobility; modA+2 for sit to stand and to take a few steps to [facility]. Pt minA to [place] for ADL overall.  Pt VSS, 2 family members in room. BP in sitting 92/67; after exertion 107/47. Pt would benefit from continued OT skilled services. OT following acutely.      Follow Up Recommendations  Home health OT;Supervision/Assistance - 24 hour    Equipment Recommendations  3 in 1 bedside commode(large or wide 3in1 and hoyer lift)    Recommendations for Other Services       Precautions / Restrictions Precautions Precautions: Fall Restrictions Weight Bearing Restrictions: No      Mobility Bed Mobility Overal bed mobility: Needs Assistance Bed Mobility: Rolling;Supine to Sit;Sit to Supine Rolling: Mod assist;+2 for physical assistance;+2 for safety/equipment   Supine to sit: Max assist;+2 for physical assistance;+2 for safety/equipment Sit to supine: Max assist;+2 for physical assistance;+2 for  safety/equipment   General bed mobility comments: Pt assisting to turn to R by reaching with arm for siderail to assist. Pt maxA +2 for trunk elevation and BLE movement. Pt leaning on R elbow to transition supine. maxA+2 overall for bed mobility  Transfers Overall transfer level: Needs assistance Equipment used: Rolling walker (2 wheeled) Transfers: Sit to/from Stand Sit to Stand: Mod assist;+2 physical assistance         General transfer comment: sit to stand with modA+2; side stepping to [facility]    Balance Overall balance assessment: Needs assistance Sitting-balance support: Feet supported Sitting balance-Leahy Scale: Fair Sitting balance - Comments: no physical assist required Postural control: Posterior lean Standing balance support: Bilateral upper extremity supported Standing balance-Leahy Scale: Poor Standing balance comment: standing with RW; taking small steps to HOB leaning on bed for support                           ADL either performed or assessed with clinical judgement   ADL Overall ADL's : Needs assistance/impaired Eating/Feeding: Minimal assistance;Sitting;Bed level   Grooming: Minimal assistance;Sitting;Bed level Grooming Details (indicate cue type and reason): cues to sequence to wash face Upper Body Bathing: Moderate assistance;Sitting;Bed level   Lower Body Bathing: Total assistance;Cueing for safety;Sitting/lateral leans;Bed level   Upper Body Dressing : Moderate assistance;Sitting;Bed level   Lower Body Dressing: Total assistance;Sitting/lateral leans;Bed level   Toilet Transfer: Total assistance;+2 for physical assistance;+2 for safety/equipment Toilet Transfer Details (indicate cue type and reason): able to take side steps to [facility], but unable to pivot at this time. Toileting- Clothing Manipulation and Hygiene: Total assistance;Sitting/lateral lean;Bed level       Functional  mobility during ADLs: Moderate assistance;+2 for physical  assistance;Rolling walker;Cueing for safety;Cueing for sequencing General ADL Comments: Pt requiring increased assist for ADL. Pt currently with decreased ability to care for self, decreased activity tolerance and slow/delayed processing with hints of disorientation.      Vision Baseline Vision/History: No visual deficits Patient Visual Report: No change from baseline Vision Assessment?: No apparent visual deficits     Perception     Praxis      Pertinent Vitals/Pain Pain Assessment: No/denies pain     Hand Dominance Right   Extremity/Trunk Assessment Upper Extremity Assessment Upper Extremity Assessment: Generalized weakness   Lower Extremity Assessment Lower Extremity Assessment: Defer to PT evaluation   Cervical / Trunk Assessment Cervical / Trunk Assessment: Other exceptions Cervical / Trunk Exceptions: large body habitus   Communication Communication Communication: No difficulties   Cognition Arousal/Alertness: Awake/alert Behavior During Therapy: WFL for tasks assessed/performed Overall Cognitive Status: Impaired/Different from baseline Area of Impairment: Following commands;Problem solving;Awareness                       Following Commands: Follows one step commands with increased time   Awareness: Emergent Problem Solving: Slow processing;Difficulty sequencing;Requires verbal cues General Comments: Pt following simple commands with various mutlimodal cues. Pt requiring multiple cues for each step of getting OOB.    General Comments  Pt VSS, 2 family members in room. BP in sitting 92/67; after exertion 107/47.    Exercises     Shoulder Instructions      Home Living Family/patient expects to be discharged to:: Private residence Living Arrangements: Alone Available Help at Discharge: Family;Available 24 hours/day Type of Home: House Home Access: Ramped entrance     Home Layout: Two level;Able to live on main level with bedroom/bathroom      Bathroom Shower/Tub: Occupational psychologist: Handicapped height Bathroom Accessibility: Yes   Home Equipment: Environmental consultant - 4 wheels;Walker - standard;Grab bars - tub/shower;Tub bench;Wheelchair - manual          Prior Functioning/Environment Level of Independence: Needs assistance  Gait / Transfers Assistance Needed: Rollator very short household distances. Pt sleeps in lift chair, so typically just ambulates chair to/from bathroom. Family pushes her in a w/c for longer distances. ADL's / Homemaking Assistance Needed: family assists with ADLs as needed. Has been unable to get in the shower x 1 week.            OT Problem List: Decreased strength;Decreased activity tolerance;Impaired balance (sitting and/or standing);Decreased safety awareness;Pain;Increased edema;Decreased knowledge of use of DME or AE;Decreased cognition      OT Treatment/Interventions: Self-care/ADL training;Therapeutic exercise;Energy conservation;DME and/or AE instruction;Therapeutic activities;Patient/family education;Balance training;Cognitive remediation/compensation    OT Goals(Current goals can be found in the care plan section) Acute Rehab OT Goals Patient Stated Goal: get OOB OT Goal Formulation: With family Time For Goal Achievement: 10/30/19 Potential to Achieve Goals: Good ADL Goals Pt Will Perform Grooming: with set-up;sitting Pt Will Transfer to Toilet: with min assist;with +2 assist;stand pivot transfer;bedside commode Pt/caregiver will Perform Home Exercise Program: Increased strength;Both right and left upper extremity;With Supervision Additional ADL Goal #1: Pt will increase to minA for bed mobility as precursor for ADL tasks OOB. Additional ADL Goal #2: Pt will complete x10 mins of ADL tasks with <3 multi modal cues in order to sequence through task.  OT Frequency: Min 3X/week   Barriers to D/C:            Co-evaluation  AM-PAC OT "6 Clicks" Daily Activity      Outcome Measure Help from another person eating meals?: A Little Help from another person taking care of personal grooming?: A Little Help from another person toileting, which includes using toliet, bedpan, or urinal?: Total Help from another person bathing (including washing, rinsing, drying)?: A Lot Help from another person to put on and taking off regular upper body clothing?: A Little Help from another person to put on and taking off regular lower body clothing?: Total 6 Click Score: 13   End of Session Equipment Utilized During Treatment: Gait belt;Rolling walker Nurse Communication: Mobility status  Activity Tolerance: Patient tolerated treatment well Patient left: in bed;with call bell/phone within reach;with bed alarm set;with family/visitor present(pt in chair position)  OT Visit Diagnosis: Unsteadiness on feet (R26.81);Muscle weakness (generalized) (M62.81);Pain Pain - Right/Left: Right Pain - part of body: Knee                Time: 0930-1005 OT Time Calculation (min): 35 min Charges:  OT General Charges $OT Visit: 1 Visit OT Evaluation $OT Eval Moderate Complexity: 1 Mod OT Treatments $Therapeutic Activity: 8-22 mins  Jefferey Pica, OTR/L Acute Rehabilitation Services Pager: [phone] Office: [phone]   Addis Tuohy C 10/16/2019, 3:49 PM

## 2019-10-16 NOTE — Progress Notes (Addendum)
Family and patient  were given explanation of visitation policy,  per RN shift report [facility] aware of patient/family questions. AC made aware again of patient family questions.  Charge RN aware and had given patient and family visitation policy at admission. Family verbalized understanding of policy but still with questions. Charge RN updated.  will continue to monitor. Christpher Stogsdill, Bettina Gavia RN

## 2019-10-17 ENCOUNTER — Inpatient Hospital Stay (HOSPITAL_COMMUNITY): Payer: 59

## 2019-10-17 LAB — BASIC METABOLIC PANEL
Anion gap: 10 (ref 5–15)
BUN: 42 mg/dL — ABNORMAL HIGH (ref 8–23)
CO2: 21 mmol/L — ABNORMAL LOW (ref 22–32)
Calcium: 8.4 mg/dL — ABNORMAL LOW (ref 8.9–10.3)
Chloride: 112 mmol/L — ABNORMAL HIGH (ref 98–111)
Creatinine, Ser: 2.09 mg/dL — ABNORMAL HIGH (ref 0.44–1.00)
GFR calc Af Amer: 29 mL/min — ABNORMAL LOW (ref >60)
GFR calc non Af Amer: 25 mL/min — ABNORMAL LOW (ref >60)
Glucose, Bld: 99 mg/dL (ref 70–99)
Potassium: 4.2 mmol/L (ref 3.5–5.1)
Sodium: 143 mmol/L (ref 135–145)

## 2019-10-17 LAB — AMMONIA: Ammonia: 41 umol/L — ABNORMAL HIGH (ref 9–35)

## 2019-10-17 LAB — PATHOLOGIST SMEAR REVIEW

## 2019-10-17 LAB — MAGNESIUM: Magnesium: 1.8 mg/dL (ref 1.7–2.4)

## 2019-10-17 LAB — CBC WITH DIFFERENTIAL/PLATELET
Abs Immature Granulocytes: 0.05 10*3/uL (ref 0.00–0.07)
Basophils Absolute: 0 10*3/uL (ref 0.0–0.1)
Basophils Relative: 1 %
Eosinophils Absolute: 0.1 10*3/uL (ref 0.0–0.5)
Eosinophils Relative: 2 %
HCT: 24 % — ABNORMAL LOW (ref 36.0–46.0)
Hemoglobin: 7 g/dL — ABNORMAL LOW (ref 12.0–15.0)
Immature Granulocytes: 1 %
Lymphocytes Relative: 7 %
Lymphs Abs: 0.4 10*3/uL — ABNORMAL LOW (ref 0.7–4.0)
MCH: 27.6 pg (ref 26.0–34.0)
MCHC: 29.2 g/dL — ABNORMAL LOW (ref 30.0–36.0)
MCV: 94.5 fL (ref 80.0–100.0)
Monocytes Absolute: 0.4 10*3/uL (ref 0.1–1.0)
Monocytes Relative: 7 %
Neutro Abs: 4.7 10*3/uL (ref 1.7–7.7)
Neutrophils Relative %: 82 %
Platelets: 28 10*3/uL — CL (ref 150–400)
RBC: 2.54 MIL/uL — ABNORMAL LOW (ref 3.87–5.11)
RDW: 19.3 % — ABNORMAL HIGH (ref 11.5–15.5)
WBC: 5.7 10*3/uL (ref 4.0–10.5)
nRBC: 0.4 % — ABNORMAL HIGH (ref 0.0–0.2)

## 2019-10-17 LAB — BLOOD GAS, ARTERIAL
Acid-base deficit: 3.8 mmol/L — ABNORMAL HIGH (ref 0.0–2.0)
Bicarbonate: 19.7 mmol/L — ABNORMAL LOW (ref 20.0–28.0)
Drawn by: 36277
FIO2: 21
O2 Saturation: 95.9 %
Patient temperature: 36.8
pCO2 arterial: 29 mmHg — ABNORMAL LOW (ref 32.0–48.0)
pH, Arterial: 7.445 (ref 7.350–7.450)
pO2, Arterial: 106 mmHg (ref 83.0–108.0)

## 2019-10-17 LAB — PREPARE RBC (CROSSMATCH)

## 2019-10-17 LAB — GLUCOSE, CAPILLARY
Glucose-Capillary: 121 mg/dL — ABNORMAL HIGH (ref 70–99)
Glucose-Capillary: 70 mg/dL (ref 70–99)

## 2019-10-17 LAB — HOMOCYSTEINE: Homocysteine: 12.4 umol/L (ref 0.0–17.2)

## 2019-10-17 MED ORDER — SODIUM CHLORIDE 0.9% IV SOLUTION: Freq: Once | INTRAVENOUS | Status: DC

## 2019-10-17 MED ORDER — LACTULOSE 10 GM/15ML PO SOLN
10.0000 g | Freq: Two times a day (BID) | ORAL | Status: DC
  Administered 2019-10-18: 10 g via ORAL
  Filled 2019-10-17: qty 15

## 2019-10-17 MED ORDER — SODIUM CHLORIDE 0.9 % IV SOLN
1.0000 g | Freq: Two times a day (BID) | INTRAVENOUS | Status: AC
  Administered 2019-10-17 – 2019-10-20 (×8): 1 g via INTRAVENOUS
  Filled 2019-10-17 (×8): qty 1

## 2019-10-17 MED ORDER — DEXTROSE 50 % IV SOLN
INTRAVENOUS | Status: AC
  Administered 2019-10-17: 1
  Filled 2019-10-17: qty 50

## 2019-10-17 NOTE — Progress Notes (Signed)
Modified Barium Swallow Progress Note  Patient Details  Name: [name] MRN: [id number] Date of Birth: Oct 03, 1955  Today's Date: 10/17/2019  Modified Barium Swallow completed.  Full report located under Chart Review in the Imaging Section.  Brief recommendations include the following:  Clinical Impression  Pt's oropharyngeal dysphagia was inconsistent in relation to impaired timing and coordination of swallow sequence resulting in penetration/aspiration. When bolus was large and laryngeal closure delayed thin and nectar barium entered vestibule and either remained above the vocal cords, contacted cords or was aspirated (nectar x 1) with and without sensation (delayed cough x 2 throughout study). Other trials with smaller bolus and and laryngeal closure timely penetration/aspiration was prevented. Chin tuck was of no benefit. Orally pt exhibited anterior and posterior movement of boluses with delayed and incoordinated prolusion. Pt was awake however continues with being alert, timely processing which suspect affected integrity of her swallow. Slower transit of honey thick barium allowed for laryngeal vestibue to close without significant residue (trace-min). Esophagus viewed however brief due to body habitus which did not reveal remarkable findings. Recommend upgrade liquid consistency to honey thick given inconsistent nature of penetration and sensation with thinner consistencies. Continue Dys 3 texture, crush pills, full supervision and assist with meals. Reviewed results with pt's sister. Prognosis is good for swallow improvements once cognition and overall medical status improves.      Swallow Evaluation Recommendations       SLP Diet Recommendations: Dysphagia 3 (Mech soft) solids;Honey thick liquids   Liquid Administration via: Cup   Medication Administration: Crushed with puree   Supervision: Staff to assist with self feeding;Patient able to self feed;Full supervision/cueing for  compensatory strategies   Compensations: Minimize environmental distractions;Slow rate;Small sips/bites   Postural Changes: Seated upright at 90 degrees   Oral Care Recommendations: Oral care BID        [name] 10/17/2019,2:53 PM  Orbie Pyo Ferguson.Ed Risk analyst [phone] Office [phone]

## 2019-10-17 NOTE — Progress Notes (Addendum)
CRITICAL VALUE ALERT  Critical Value:  Platelets 28  Date & Time Notied:  10/17/2019 1510  Provider Notified: B. Ayiku MD  Orders Received/Actions taken: awaiting orders

## 2019-10-17 NOTE — Progress Notes (Signed)
Pharmacy Antibiotic Note  [name] is a 64 y.o. female admitted on 10/14/2019 with UTI.  Pharmacy has been consulted to transition Cefepime to Azactam.   The patient is noted to be growing pan-sensitive E.coli however de-escalation is difficult given the patient's allergies + AKI + thrombocytopenia. The patient has multiple allergies listed including rash/hives to PCN, rash to cephalosporins and cipro. The patient has tolerated some cephalosporins but not others - was noted to have a rash with Rocephin as recently as May 2020 but has tolerated cefepime.   The patient is noted to be in AKI with SCr uptrending to 2.16 (baseline appears to be 1-1.4). The patient is also noted to have pancytopenia including plts 46 with heme/onc consulted for evaluation and workup.   Will "narrow" slightly from Cefepime to Aztreonam today. MD wants a 7d course - which seems reasonable to consider for now. Will monitor SCr trends and adjust Azactam dosing accordingly.   Plan: - Start Azactam 1g IV every 12 hours - Added stop date for 5/27 after total abx LOT of 7d - Will continue to follow renal function, culture results, LOT, and antibiotic de-escalation plans   Height: 5\' 4"  (162.6 cm) Weight: 111 kg (244 lb 11.4 oz) IBW/kg (Calculated) : 54.7  Temp (24hrs), Avg:96.3 F (35.7 C), Min:94.2 F (34.6 C), Max:97.9 F (36.6 C)  Recent Labs  Lab 10/14/19 1330 10/14/19 2311 10/15/19 0446 10/16/19 0753  WBC 5.9 5.2 4.7 3.2*  CREATININE 2.06*  --   --  2.16*  LATICACIDVEN 1.1  --   --   --     Estimated Creatinine Clearance: 32.9 mL/min (A) (by C-G formula based on SCr of 2.16 mg/dL (H)).    Allergies  Allergen Reactions  . Nutritional Supplements Anaphylaxis  . Other Anaphylaxis    Reaction to tree nuts  . Amoxicillin-Pot Clavulanate Other (See Comments)    headache  . Ciprofloxacin Nausea Only and Rash  . Food Hives and Other (See Comments)    Potato  . Keflex [Cephalexin] Rash  .  Penicillins Rash    Headache. And hives - Dose not tolerate Cephalosporin either   Has patient had a PCN reaction causing immediate rash, facial/tongue/throat swelling, SOB or lightheadedness with hypotension: No Has patient had a PCN reaction causing severe rash involving mucus membranes or skin necrosis: No Has patient had a PCN reaction that required hospitalization: No Has patient had a PCN reaction occurring within the last 10 years: Yes (reaction was May 2020) If all of the above answers are "NO", then may proce  . Rocephin [Ceftriaxone] Rash  . Tomato Hives     5/21 Cefepime >> 5/24 5/24 Azactam >> (5/27)  5/21 Bcx: ngtd x1 day  5/21Ucx: 100K E.coli (pan-sensitive)  Thank you for allowing pharmacy to be a part of this patient's care.  Alycia Rossetti, PharmD, BCPS Clinical Pharmacist Clinical phone for 10/17/2019: [phone] 10/17/2019 10:35 AM   **Pharmacist phone directory can now be found on [email or web address] ([email or web address]).  Listed under [place].

## 2019-10-17 NOTE — Progress Notes (Signed)
Pt increasingly lethargic over the past hour. Pt arousable to repeated verbal and shaking stimuli  Blood pressures soft per flow sheets. One unit PRBCs ordered, awaiting preparation to administer. During bedside report patient would not move left arm but could follow commands and move other extremities. CBG 70, administering 1/2 amp D50 per MAR. Rapid Response notified and are bedside. Sharlet Salina MD paged.

## 2019-10-17 NOTE — Progress Notes (Signed)
  Speech Language Pathology  Patient Details Name: [name] MRN: [id number] DOB: 1955/10/23 Today's Date: 10/17/2019 Time:  -      Pt's MBS is now going to be 1300 today            [name] 10/17/2019, 10:20 AM  Orbie Pyo Colvin Caroli.Ed Risk analyst [phone] Office [phone]

## 2019-10-17 NOTE — Progress Notes (Signed)
Occupational Therapy Treatment Note  Pt more alert today although continues to be confused. Sister present during session. Recommend Wound consult for fungus/Moisture Associated Skin Damage. Pt would most likely benefit from use of Interdry under [place] - discussed with nsg. Will continue to follow acutely to facilitae safe DC home with 24/7 assistance of family.     10/17/19 1200  OT Visit Information  Last OT Received On 10/17/19  Assistance Needed +2  PT/OT/SLP Co-Evaluation/Treatment Yes  Reason for Co-Treatment Complexity of the patient's impairments (multi-system involvement);To address functional/ADL transfers;For patient/therapist safety  OT goals addressed during session ADL's and self-care;Strengthening/ROM  History of Present Illness 64 y.o. female with medical history significant of paroxysmal A. fib, chronic diastolic CHF, COPD, history of thyroidectomy on Synthroid, GERD, hypertension, asthma, anxiety, depression, morbid obesity (BMI 41.19) presenting to the ED via EMS for evaluation of generalized weakness and altered mental status. Pt admitted for UTI.  Precautions  Precautions Fall  Pain Assessment  Pain Assessment Faces  Faces Pain Scale 6  Pain Location back/generalized moaning with mobility  Pain Descriptors / Indicators Grimacing;Moaning;Discomfort;Crying  Pain Intervention(s) Limited activity within patient's tolerance  Cognition  Arousal/Alertness Awake/alert  Behavior During Therapy Flat affect  Overall Cognitive Status Impaired/Different from baseline  Area of Impairment Orientation;Attention;Memory;Following commands;Safety/judgement;Awareness;Problem solving  Orientation Level Disoriented to;Place;Time;Situation  Current Attention Level Sustained  Memory Decreased recall of precautions;Decreased short-term memory  Following Commands Follows one step commands with increased time  Safety/Judgement Decreased awareness of safety;Decreased awareness of deficits   Awareness Intellectual  Problem Solving Slow processing;Decreased initiation;Difficulty sequencing;Requires verbal cues;Requires tactile cues  Upper Extremity Assessment  Upper Extremity Assessment Generalized weakness  ADL  Overall ADL's  Needs assistance/impaired  Grooming Minimal assistance;Cueing for safety;Cueing for sequencing;Sitting  Upper Body Bathing Moderate assistance;Sitting;Bed level  Toileting- Clothing Manipulation and Hygiene Total assistance;Bed level  Bed Mobility  Overal bed mobility Needs Assistance  Bed Mobility Rolling;Supine to Sit;Sit to Supine  Rolling Max assist;+2 for safety/equipment  Supine to sit Max assist;+2 for physical assistance;HOB elevated  Sit to supine Max assist;+2 for physical assistance  Balance  Overall balance assessment Needs assistance  Sitting balance-Leahy Scale Poor  Sitting balance - Comments posteiror lean at times; facilitation of anterior weight shift however pt had difficulty maintianing postural control affected by poor attention  Standing balance-Leahy Scale Zero  Transfers  Overall transfer level Needs assistance  Equipment used Rolling walker (2 wheeled)  Transfers Sit to/from Stand  Sit to Stand +2 physical assistance;Max assist  General transfer comment Sit to stand x 3 trial from EOB with RW. Therapists blocking bilat feet, pt pulling up on recliner, and use of bed pad to lift hips. Pt only able to sustain static stand a few seconds while remaining in a flexed posture.  General Comments  General comments (skin integrity, edema, etc.) Pt with MASD and what appears to be fungus under pannus  - recommend Interdry  OT - End of Session  Equipment Utilized During Treatment Rolling walker;Gait belt  Activity Tolerance Patient tolerated treatment well  Patient left in bed;with call bell/phone within reach;with bed alarm set;with family/visitor present;with SCD's reapplied  Nurse Communication Mobility status  OT Assessment/Plan   OT Plan Discharge plan remains appropriate  OT Visit Diagnosis Unsteadiness on feet (R26.81);Muscle weakness (generalized) (M62.81);Pain  Pain - Right/Left Right  Pain - part of body Knee  OT Frequency (ACUTE ONLY) Min 3X/week  Follow Up Recommendations Home health OT;Supervision/Assistance - 24 hour  OT Equipment 3 in 1 bedside  commode;Other (comment) Harrel Lemon)  AM-PAC OT "6 Clicks" Daily Activity Outcome Measure (Version 2)  Help from another person eating meals? 3  Help from another person taking care of personal grooming? 3  Help from another person toileting, which includes using toliet, bedpan, or urinal? 1  Help from another person bathing (including washing, rinsing, drying)? 2  Help from another person to put on and taking off regular upper body clothing? 2  Help from another person to put on and taking off regular lower body clothing? 1  6 Click Score 12  OT Goal Progression  Progress towards OT goals Progressing toward goals  Acute Rehab OT Goals  Patient Stated Goal not stated  OT Goal Formulation With family  Time For Goal Achievement 10/30/19  Potential to Achieve Goals Good  ADL Goals  Pt Will Perform Grooming with set-up;sitting  Pt Will Transfer to Toilet with min assist;with +2 assist;stand pivot transfer;bedside commode  Pt/caregiver will Perform Home Exercise Program Increased strength;Both right and left upper extremity;With Supervision  Additional ADL Goal #1 Pt will increase to minA for bed mobility as precursor for ADL tasks OOB.  Additional ADL Goal #2 Pt will complete x10 mins of ADL tasks with <3 multi modal cues in order to sequence through task.  OT Time Calculation  OT Start Time (ACUTE ONLY) 1031  OT Stop Time (ACUTE ONLY) 1104  OT Time Calculation (min) 33 min  OT General Charges  $OT Visit 1 Visit  OT Treatments  $Self Care/Home Management  8-22 mins  Maurie Boettcher, OT/L   Acute OT Clinical Specialist [place] Pager  [phone] Office [phone]

## 2019-10-17 NOTE — Progress Notes (Addendum)
Physical Therapy Treatment Patient Details Name: [name] MRN: [id number] DOB: May 23, 1956 Today's Date: 10/17/2019    History of Present Illness 64 y.o. female with medical history significant of paroxysmal A. fib, chronic diastolic CHF, COPD, history of thyroidectomy on Synthroid, GERD, hypertension, asthma, anxiety, depression, morbid obesity (BMI 41.19) presenting to the ED via EMS for evaluation of generalized weakness and altered mental status. Pt admitted for UTI.    PT Comments    Pt more alert and cooperative today. Remains confused but is improving. She required +2 max assist supine to sit. +2 max assist sit to stand with RW x 3 trials. Pt able to perform grooming tasks with OT sitting EOB. +2 max assist for return to supine. Pt then positioned in R sidelying using pillow/blanket rolls for support.   Follow Up Recommendations  Home health PT;Supervision/Assistance - 24 hour     Equipment Recommendations  Other (comment)(hoyer lift)    Recommendations for Other Services       Precautions / Restrictions Precautions Precautions: Fall    Mobility  Bed Mobility Overal bed mobility: Needs Assistance Bed Mobility: Rolling;Supine to Sit;Sit to Supine Rolling: Max assist;+2 for safety/equipment   Supine to sit: Max assist;+2 for physical assistance;HOB elevated Sit to supine: Max assist;+2 for physical assistance   General bed mobility comments: helicopter method using bed pad to transition to/from sitting EOB  Transfers Overall transfer level: Needs assistance Equipment used: Rolling walker (2 wheeled) Transfers: Sit to/from Stand Sit to Stand: +2 physical assistance;Max assist         General transfer comment: Sit to stand x 3 trial from EOB with RW. Therapists blocking bilat feet, pt pulling up on RW, and use of bed pad to lift hips. Pt only able to sustain static stand a few seconds while remaining in a flexed posture.  Ambulation/Gait              General Gait Details: unable   Stairs             Wheelchair Mobility    Modified Rankin (Stroke Patients Only)       Balance Overall balance assessment: Needs assistance Sitting-balance support: Feet supported;No upper extremity supported Sitting balance-Leahy Scale: Poor Sitting balance - Comments: posteiror lean at times; facilitation of anterior weight shift however pt had difficulty maintianing postural control affected by poor attention   Standing balance support: Bilateral upper extremity supported;During functional activity Standing balance-Leahy Scale: Zero Standing balance comment: reliant on external support                            Cognition Arousal/Alertness: Awake/alert Behavior During Therapy: Flat affect Overall Cognitive Status: Impaired/Different from baseline Area of Impairment: Orientation;Attention;Memory;Following commands;Safety/judgement;Awareness;Problem solving                 Orientation Level: Disoriented to;Place;Time;Situation Current Attention Level: Sustained Memory: Decreased recall of precautions;Decreased short-term memory Following Commands: Follows one step commands with increased time Safety/Judgement: Decreased awareness of safety;Decreased awareness of deficits Awareness: Intellectual Problem Solving: Slow processing;Decreased initiation;Difficulty sequencing;Requires verbal cues;Requires tactile cues General Comments: Confused but improving. Dry wit. Attempting to mask confusion with humor.      Exercises      General Comments General comments (skin integrity, edema, etc.): Pt with MASD and what appears to be fungus under pannus  - recommend Interdry      Pertinent Vitals/Pain Pain Assessment: Faces Faces Pain Scale: Hurts even more Pain Location:  back/generalized moaning with mobility Pain Descriptors / Indicators: Grimacing;Moaning;Discomfort;Crying Pain Intervention(s): Limited activity within  patient's tolerance    Home Living                      Prior Function            PT Goals (current goals can now be found in the care plan section) Acute Rehab PT Goals Patient Stated Goal: not stated Progress towards PT goals: Progressing toward goals    Frequency    Min 3X/week      PT Plan Current plan remains appropriate    Co-evaluation PT/OT/SLP Co-Evaluation/Treatment: Yes Reason for Co-Treatment: Complexity of the patient's impairments (multi-system involvement);To address functional/ADL transfers;For patient/therapist safety   OT goals addressed during session: ADL's and self-care;Strengthening/ROM      AM-PAC PT "6 Clicks" Mobility   Outcome Measure  Help needed turning from your back to your side while in a flat bed without using bedrails?: Total Help needed moving from lying on your back to sitting on the side of a flat bed without using bedrails?: Total Help needed moving to and from a bed to a chair (including a wheelchair)?: Total Help needed standing up from a chair using your arms (e.g., wheelchair or bedside chair)?: Total Help needed to walk in hospital room?: Total Help needed climbing 3-5 steps with a railing? : Total 6 Click Score: 6    End of Session Equipment Utilized During Treatment: Gait belt Activity Tolerance: Patient tolerated treatment well Patient left: in bed;with call bell/phone within reach;with bed alarm set;with family/visitor present Nurse Communication: Mobility status PT Visit Diagnosis: Other abnormalities of gait and mobility (R26.89);Pain;Muscle weakness (generalized) (M62.81)     Time: [id number] PT Time Calculation (min) (ACUTE ONLY): 33 min  Charges:  $Therapeutic Activity: 8-22 mins                     Lorrin Goodell, PT  Office # [phone] Pager [phone]    Lorriane Shire 10/17/2019, 1:31 PM

## 2019-10-17 NOTE — Progress Notes (Signed)
Progress Note    [name]  [id number] DOB: 1955-06-10  DOA: 10/14/2019 PCP: Debbrah Alar, NP      Brief Narrative:    Medical records reviewed and are as summarized below:  [name] is an 64 y.o. female with a history of PAF on eliquis, gastric bypass with iron deficiency anemia, hypothyroidism, chronic HFpEF, HTN, COPD/asthma, anxiety, depression and morbid obesity who presented to the ED with increasing confusion and generalized weakness as well as right knee pain after falling on it during a transfer at home. She also complained of dysuria but was confused on arrival. Family has noticed increasing leg swelling and some abdominal distention despite not eating much for a few days. She was hypothermic, hypotensive with hgb 7.8, down from baseline, normocytic, and platelets 43k down from normal previously, creatinine 2 up from 1.4, UA with hematuria, pyuria, bacteriuria, CXR no active disease. CT head without acute abnormality. CT renal stone study showed nonobstructing nephrolithiasis with mild prominence of the left renal pelvis with perinephric edema. CT also showing large volume stool throughout the colon concerning for constipation. No bowel obstruction or inflammation. Liver with nodular hepatic contour, can be seen with cirrhosis. Mild splenomegaly, unchanged. She was given cefepime, 2L LR, tramadol and voltaren gel. Platelets have continued downward trend and hematology is consulted.      Assessment/Plan:   Principal Problem:   UTI (urinary tract infection) Active Problems:   Anemia   Sepsis ([place])   Hypothermia   Thrombocytopenia (H[facility])   AKI (acute kidney injury) ([place])  Sepsis secondary to UTI: Hypothermia has improved.  BP still on the low side.  Cortisol adequate IV cefepime has been changed to IV Azactam.  Discussed with pharmacist. Urine culture showed pansensitive E. Coli.  No growth on the blood cultures thus far.  Thrombocytopenia:  Platelet count is much worse today.  Possibly related to sepsis. With mild splenomegaly and known hepatic steatosis with nodular changes ?cirrhosis. Also has INR 2.2, elevated d-dimer and fibrinogen. No schistocytes - Appreciate hematology evaluation - No indication for transfusion at this time, though patient and family consent if felt to be needed.  - Continue monitoring platelets.   Iron deficiency anemia/acute on chronic anemia: Hemoglobin dropped from 7.4 up to 7.0 today.  Transfuse another unit of blood red blood cells. S/p transfusion of 1 unit of PRBCs on 10/16/2019. Monitor H&H.  Risks, benefits and alternatives of blood transfusion were discussed with the patient and family at the bedside.  They are agreeable to blood transfusion.  Elevated d-dimer, leg edema:  No evidence of lower extremity DVT on venous duplex.  AKI with NAGMA:BUN 70, creatinine 2.0.Creatinine was 1.4 on 08/29/2019. Elevated BUN argues for prerenal azotemia (vs. upper GI bleed).  Discontinue IV fluids to prevent fluid overload - Hold lasix, spironolactone.  - BMP in AM.   Acute toxic metabolic encephalopathy: Likely related to sepsis, AKI, dehydration. No acute abnormality on CT head.   - Delirium precautions.  Continue supportive care  Right knee pain:Knee pain started after her fall. X-ray showing large joint effusion without evidence of an acute displaced fracture or dislocation.  - Continue pain management with Tylenol, Voltaren gel.  Generalized weakness:  - PT/OT  Dysphagia:  - SLP evaluation  Chronic diastolic CHF: Compensated.  BNP wnl, no pulmonary edema, appears volume down.   PAF:  - Holding diltiazem with hypotension and controlled rate (NSR) - Hold eliquis w/severe thrombocytopenia  Hypothyroidism: TSH normal.  - Continue  home synthroid 460mcg!  Constipation:CT also showing large volume stool throughout the colon concerning for constipation. No bowel obstruction or  inflammation.  Laxatives as needed.  COPD/asthma: No exacerbation.  - continue BD.     Body mass index is 42 kg/m.  (Morbid obesity)   Family Communication/Anticipated D/C date and plan/Code Status   DVT prophylaxis: SCDs Code Status: Full code Family Communication: Plan discussed with patient and family at the bedside Disposition Plan:    Status is: Inpatient  Remains inpatient appropriate because:IV treatments appropriate due to intensity of illness or inability to take PO and Inpatient level of care appropriate due to severity of illness   Dispo: The patient is from: Home              Anticipated d/c is to: Home              Anticipated d/c date is: 3 days              Patient currently is not medically stable to d/c.  Her sister-in-law requested that a third family member be allowed to visit.  She said she was unhappy that she had not received any favorable response thus far.  I have notified charge nurse, Curt Bears, RN to follow-up on this.        Subjective:   Patient denies any complaints.  However, according to her sister-in-law at the bedside, she has been intermittently confused..  Objective:    Vitals:   10/17/19 0816 10/17/19 0820 10/17/19 1230 10/17/19 1616  BP: (!) 106/46  (!) 94/40 (!) 107/28  Pulse: (!) 103  76 76  Resp: 15  13 14   Temp: 97.9 F (36.6 C)  (!) 97.3 F (36.3 C) 98.4 F (36.9 C)  TempSrc: Oral  Oral Oral  SpO2: 97% 98% 95% 99%  Weight:      Height:       No data found.   Intake/Output Summary (Last 24 hours) at 10/17/2019 1705 Last data filed at 10/17/2019 0245 Gross per 24 hour  Intake 3137.27 ml  Output --  Net 3137.27 ml   Filed Weights   10/14/19 1300 10/15/19 0111  Weight: 108.9 kg 111 kg    Exam:  GEN: NAD SKIN: Erythematous changes in suprapubic area and bruises on upper extremities (all present on admission) EYES: EOMI ENT: MMM CV: RRR PULM: CTA B ABD: soft, obese, no abdominal tenderness, no rebound  tenderness or guarding, +BS CNS: AAO x 2 ( person and place), non focal EXT: No edema or tenderness   Data Reviewed:   I have personally reviewed following labs and imaging studies:  Labs: Labs show the following:   Basic Metabolic Panel: Recent Labs  Lab 10/14/19 1330 10/14/19 1330 10/14/19 1338 10/14/19 1338 10/16/19 0753 10/17/19 1346  NA 141  --  138  --  141 143  K 4.1   < > 3.8   < > 3.7 4.2  CL 105  --   --   --  113* 112*  CO2 18*  --   --   --  17* 21*  GLUCOSE 85  --   --   --  97 99  BUN 70*  --   --   --  53* 42*  CREATININE 2.06*  --   --   --  2.16* 2.09*  CALCIUM 8.2*  --   --   --  8.0* 8.4*  MG  --   --   --   --   --  1.8   < > = values in this interval not displayed.   GFR Estimated Creatinine Clearance: 34 mL/min (A) (by C-G formula based on SCr of 2.09 mg/dL (H)). Liver Function Tests: Recent Labs  Lab 10/14/19 1330 10/16/19 0753  AST 21 25  ALT 24 22  ALKPHOS 125 105  BILITOT 1.4* 0.6  PROT 6.4* 5.5*  ALBUMIN 2.5* 2.0*   No results for input(s): LIPASE, AMYLASE in the last 168 hours. No results for input(s): AMMONIA in the last 168 hours. Coagulation profile Recent Labs  Lab 10/14/19 1330 10/15/19 0446  INR 2.2* 2.2*    CBC: Recent Labs  Lab 10/14/19 1330 10/14/19 1338 10/14/19 2311 10/15/19 0446 10/16/19 0753 10/16/19 2049 10/17/19 1346  WBC 5.9  --  5.2 4.7 3.2*  --  5.7  NEUTROABS 5.3  --   --   --   --   --  4.7  HGB 7.8*   < > 7.3* 7.2* 6.5* 7.4* 7.0*  HCT 26.7*   < > 25.3* 24.6* 23.6* 25.2* 24.0*  MCV 94.7  --  95.1 94.6 100.9*  --  94.5  PLT 43*  --  28* 25*  30* 46*  --  28*   < > = values in this interval not displayed.   Cardiac Enzymes: No results for input(s): CKTOTAL, CKMB, CKMBINDEX, TROPONINI in the last 168 hours. BNP (last 3 results) No results for input(s): PROBNP in the last 8760 hours. CBG: No results for input(s): GLUCAP in the last 168 hours. D-Dimer: Recent Labs    10/15/19 0446  DDIMER  0.59*   Hgb A1c: No results for input(s): HGBA1C in the last 72 hours. Lipid Profile: No results for input(s): CHOL, HDL, LDLCALC, TRIG, CHOLHDL, LDLDIRECT in the last 72 hours. Thyroid function studies: No results for input(s): TSH, T4TOTAL, T3FREE, THYROIDAB in the last 72 hours.  Invalid input(s): FREET3 Anemia work up: Recent Labs    10/15/19 0446  VITAMINB12 1,499*  FOLATE 8.9  FERRITIN 2,328*  TIBC 244*  IRON 35  RETICCTPCT 2.4   Sepsis Labs: Recent Labs  Lab 10/14/19 1330 10/14/19 1330 10/14/19 2311 10/15/19 0446 10/16/19 0753 10/17/19 1346  WBC 5.9   < > 5.2 4.7 3.2* 5.7  LATICACIDVEN 1.1  --   --   --   --   --    < > = values in this interval not displayed.    Microbiology Recent Results (from the past 240 hour(s))  Blood Culture (routine x 2)     Status: None (Preliminary result)   Collection Time: 10/14/19  1:20 PM   Specimen: BLOOD  Result Value Ref Range Status   Specimen Description BLOOD RIGHT ANTECUBITAL  Final   Special Requests   Final    BOTTLES DRAWN AEROBIC AND ANAEROBIC Blood Culture results may not be optimal due to an inadequate volume of blood received in culture bottles   Culture   Final    NO GROWTH 3 DAYS Performed at [facility], [place] [address] [place], [place] [address]    Report Status PENDING  Incomplete  Blood Culture (routine x 2)     Status: None (Preliminary result)   Collection Time: 10/14/19  1:25 PM   Specimen: BLOOD RIGHT HAND  Result Value Ref Range Status   Specimen Description BLOOD RIGHT HAND  Final   Special Requests   Final    BOTTLES DRAWN AEROBIC AND ANAEROBIC Blood Culture adequate volume   Culture   Final  NO GROWTH 3 DAYS Performed at [facility], [place] [address] [place], [place] [address]    Report Status PENDING  Incomplete  Urine culture     Status: Abnormal   Collection Time: 10/14/19  3:10 PM   Specimen: In/Out Cath Urine  Result Value Ref Range Status   Specimen Description  IN/OUT CATH URINE  Final   Special Requests   Final    NONE Performed at [facility], [place] [address] [place], [place] [address]    Culture >=100,000 COLONIES/mL ESCHERICHIA COLI (A)  Final   Report Status 10/16/2019 FINAL  Final   Organism ID, Bacteria ESCHERICHIA COLI (A)  Final      Susceptibility   Escherichia coli - MIC*    AMPICILLIN 4 SENSITIVE Sensitive     CEFAZOLIN <=4 SENSITIVE Sensitive     CEFTRIAXONE <=1 SENSITIVE Sensitive     CIPROFLOXACIN <=0.25 SENSITIVE Sensitive     GENTAMICIN <=1 SENSITIVE Sensitive     IMIPENEM <=0.25 SENSITIVE Sensitive     NITROFURANTOIN <=16 SENSITIVE Sensitive     TRIMETH/SULFA <=20 SENSITIVE Sensitive     AMPICILLIN/SULBACTAM 4 SENSITIVE Sensitive     PIP/TAZO <=4 SENSITIVE Sensitive     * >=100,000 COLONIES/mL ESCHERICHIA COLI  SARS Coronavirus 2 by RT PCR (hospital order, performed in [place] hospital lab) Nasopharyngeal Nasopharyngeal Swab     Status: None   Collection Time: 10/14/19  5:55 PM   Specimen: Nasopharyngeal Swab  Result Value Ref Range Status   SARS Coronavirus 2 NEGATIVE NEGATIVE Final    Comment: (NOTE) SARS-CoV-2 target nucleic acids are NOT DETECTED. The SARS-CoV-2 RNA is generally detectable in upper and lower respiratory specimens during the acute phase of infection. The lowest concentration of SARS-CoV-2 viral copies this assay can detect is 250 copies / mL. A negative result does not preclude SARS-CoV-2 infection and should not be used as the sole basis for treatment or other patient management decisions.  A negative result may occur with improper specimen collection / handling, submission of specimen other than nasopharyngeal swab, presence of viral mutation(s) within the areas targeted by this assay, and inadequate number of viral copies (<250 copies / mL). A negative result must be combined with clinical observations, patient history, and epidemiological information. Fact Sheet for Patients:     StrictlyIdeas.no Fact Sheet for Healthcare Providers: BankingDealers.co.za This test is not yet approved or cleared  by the [place] FDA and has been authorized for detection and/or diagnosis of SARS-CoV-2 by FDA under an Emergency Use Authorization (EUA).  This EUA will remain in effect (meaning this test can be used) for the duration of the COVID-19 declaration under Section 564(b)(1) of the Act, 21 U.S.C. section 360bbb-3(b)(1), unless the authorization is terminated or revoked sooner. Performed at [facility], [place] [address], [place], [place] [address]     Procedures and diagnostic studies:  DG Swallowing Func-Speech Pathology  Result Date: 10/17/2019 Objective Swallowing Evaluation: Type of Study: MBS-Modified Barium Swallow Study  Patient Details Name: TANVI ISSAC MRN: [id number] Date of Birth: [birth year] Today's Date: 10/17/2019 Time: SLP Start Time (ACUTE ONLY): 1309 -SLP Stop Time (ACUTE ONLY): 1326 SLP Time Calculation (min) (ACUTE ONLY): 17 min Past Medical History: Past Medical History: Diagnosis Date . Abnormal Pap smear   years ago/no biopsy . Allergic rhinitis  . Aortic atherosclerosis  . Arthritis   back- lower . Asthma  . Atrial fibrillation 12/2010  OFF XARELTO LAST MONTH DUE TO BLEEDING  IN STOOL . Bursitis of hip left . Chronic anticoagulation 12/16/2018  CHADS VASC=2 for sex and H/O HTN- she is on Eliquis . Chronic diastolic (congestive) heart failure   pt unaware of this . COPD (chronic obstructive pulmonary disease) with chronic bronchitis  . Decreased pulses in feet  . Dysrhythmia   afib, followed by Dr. Stanford Breed  . Edema of both legs  . Endometrial adenocarcinoma 02/21/2013  S/p D and C, has mirena, being followed by GYN  . Esophagitis 02/02/2014  Distal, linear erosions, noted on endoscopy . Essential hypertension 01/05/2007  Echo Nov June 2020- EF 50-55%, mild LVH, normal LA  . Fatty liver 01/07/2012 . Fibroid  1974  fibroid cyst on left fallopian tube . Fibromyalgia  . Foot ulcer   AREA HEALED RIGHT FOOT . Generalized anxiety disorder  . GERD (gastroesophageal reflux disease)  . Headache   occasional sinus headache  . Hepatomegaly  . History of blood transfusion JULY 2015 . History of cardiomegaly 12/06/2010  Noted on CT . History of E. coli septicemia  . History of papillary thyroid carcinoma 04/07/2011 . Hypocalcemia 12/10/2016 . Hypokalemia 12/10/2016 . Hypoparathyroidism 01/07/2011 . Hypothyroidism  . Iron deficiency anemia secondary to malabsorption  05/10/2007  Qualifier: Diagnosis of  By: Wynona Luna   . Kidney stone 10-12-2015  passed on their own . Leukocytosis 03/24/2009  cta cheQualifier: Diagnosis of  By: Wynona Luna   . Major depressive disorder  . Mild neurocognitive disorder 06/28/2019 . Morbid obesity  . MRSA infection  . Nephrolithiasis 2/16, 9/16  SEES DR Risa Grill . Osteomyelitis 06/14/2014 . PAF (paroxysmal atrial fibrillation) 02/05/2011  Documented 2012- NSR since.  On chronic anticoagulation. . Pernicious anemia 02/20/2014  followed by Debbrah Alar . Pneumonia  . PONV (postoperative nausea and vomiting)  . Pyelonephritis 11/24/2018 . Rash   thighs and back . Seizures   infancy secondary to fever . Staph aureus infection  . Thoracic spondylosis 12/06/2010  Noted on CT . Uterine cancer 2014  Mirena IUD . UTI (urinary tract infection) 12/13/2018 . Vitamin B 12 deficiency  . Vitamin D deficiency 05/10/2007  Qualifier: Diagnosis of  By: Wynona Luna  Past Surgical History: Past Surgical History: Procedure Laterality Date . AMPUTATION Right 05/18/2014  Procedure: RIGHT FIFTH RAY AMPUTATION FOOT;  Surgeon: Wylene Simmer, MD;  Location: [facility];  Service: Orthopedics;  Laterality: Right; . APPENDECTOMY   . BUNIONECTOMY    bilateral . COLONOSCOPY   . COLONOSCOPY WITH PROPOFOL N/A 02/02/2014  Procedure: COLONOSCOPY WITH PROPOFOL;  Surgeon: Irene Shipper, MD;  Location: [facility];  Service: Endoscopy;   Laterality: N/A; . cyst on ovary removed    . CYSTOSCOPY W/ RETROGRADES  03/03/2012  Procedure: CYSTOSCOPY WITH RETROGRADE PYELOGRAM;  Surgeon: Bernestine Amass, MD;  Location: [facility];  Service: Urology;  Laterality: Bilateral; . CYSTOSCOPY W/ URETERAL STENT PLACEMENT Right 11/25/2017  Procedure: CYSTOSCOPY WITH RETROGRADE RIGHT URETERAL STENT PLACEMENT;  Surgeon: Franchot Gallo, MD;  Location: [facility];  Service: Urology;  Laterality: Right; . CYSTOSCOPY W/ URETERAL STENT PLACEMENT Right 01/15/2018  Procedure: RIGHT STENT EXCHANGE;  Surgeon: Ardis Hughs, MD;  [facility];  Service: Urology;  Laterality: Right; . CYSTOSCOPY WITH RETROGRADE PYELOGRAM, URETEROSCOPY AND STENT PLACEMENT Left 08/25/2012  Procedure: CYSTOSCOPY WITH RETROGRADE PYELOGRAM, URETEROSCOPY;  Surgeon: Bernestine Amass, MD;  [facility];  Service: Urology;  Laterality: Left; . CYSTOSCOPY WITH STENT PLACEMENT Left 01/15/2018  Procedure: LEFT STENT PLACEMENT;  Surgeon: Ardis Hughs,  MD;  [facility];  Service: Urology;  Laterality: Left; . CYSTOSCOPY WITH STENT PLACEMENT Right 01/22/2018  Procedure: RIGHT STENT REMOVAL;  Surgeon: Ardis Hughs, MD;  [facility];  Service: Urology;  Laterality: Right; . CYSTOSCOPY/URETEROSCOPY/HOLMIUM LASER/STENT PLACEMENT Right 12/23/2017  Procedure: RIGHT URETEROSCOPY STONE REMOVAL, HOLMIUM LASER, RIGHT /STENT EXCHANGE;  Surgeon: Ardis Hughs, MD;  [facility];  Service: Urology;  Laterality: Right; . CYSTOSCOPY/URETEROSCOPY/HOLMIUM LASER/STENT PLACEMENT Left 01/22/2018  Procedure: LEFT URETEROSCOPY STONE REMOVAL HOLMIUM LASER LEFT STENT Freddi Starr;  Surgeon: Ardis Hughs, MD;  [facility];  Service: Urology;  Laterality: Left; . DILATION AND CURETTAGE OF UTERUS N/A 02/21/2013  Procedure: DILATATION AND CURETTAGE;  Surgeon: Lyman Speller, MD;  Location: [facility];  Service: Gynecology;  Laterality: N/A; . DILATION AND CURETTAGE OF UTERUS N/A  04/09/2015  Procedure: [place] IUD removal;  Surgeon: Megan Salon, MD;  Location: [facility];  Service: Gynecology;  Laterality: N/A;  Patient weight 307lbs . ESOPHAGOGASTRODUODENOSCOPY (EGD) WITH PROPOFOL N/A 02/02/2014  Procedure: ESOPHAGOGASTRODUODENOSCOPY (EGD) WITH PROPOFOL;  Surgeon: Irene Shipper, MD;  [facility];  Service: Endoscopy;  Laterality: N/A; . GASTRIC BYPASS  1974 . HOLMIUM LASER APPLICATION Left 0000000  Procedure: HOLMIUM LASER APPLICATION;  Surgeon: Bernestine Amass, MD;  [facility];  Service: Urology;  Laterality: Left; . LITHOTRIPSY  03/2012 . LITHOTRIPSY  2/16 . THYROIDECTOMY  05/15/2010 . TONSILLECTOMY AND ADENOIDECTOMY   . UPPER GASTROINTESTINAL ENDOSCOPY   . URETEROSCOPY  03/03/2012  Procedure: URETEROSCOPY;  Surgeon: Bernestine Amass, MD;  [facility];  Service: Urology;  Laterality: Left; . URETEROSCOPY WITH HOLMIUM LASER LITHOTRIPSY Bilateral 01/15/2018  Procedure: CYSTOSCOPY/BILATERAL URETEROSCOPY WITH HOLMIUM LASER LITHOTRIPSY STONE REMOVAL;  Surgeon: Ardis Hughs, MD;  [facility];  Service: Urology;  Laterality: Bilateral; HPI: [name] is a 64 y.o. female with medical history significant of paroxysmal A. fib, chronic diastolic CHF, COPD, history of thyroidectomy on Synthroid, GERD, hypertension, asthma, anxiety, depression, morbid obesity (BMI 41.19) presenting to the ED via EMS for evaluation of generalized weakness and altered mental status. She has not been eating much for the past few days.  Patient has also complained of choking to family when eating dry items such as toast. MRI was negative for acute abnormality and CXR was unremarkable.  Subjective: Pt was alert and pleasantly confused with sister and niece at bedside Assessment / Plan / Recommendation CHL IP CLINICAL IMPRESSIONS 10/17/2019 Clinical Impression Pt's oropharyngeal dysphagia was inconsistent in relation to impaired timing and coordination of swallow sequence  resulting in penetration/aspiration. When bolus was large and laryngeal closure delayed thin and nectar barium entered vestibule and either remained above the vocal cords, contacted cords or was aspirated (nectar x 1) with and without sensation (delayed cough x 2 throughout study). Other trials with smaller bolus and and laryngeal closure timely penetration/aspiration was prevented. Chin tuck was of no benefit. Orally pt exhibited anterior and posterior movement of boluses with delayed and incoordinated prolusion. Pt was awake however continues with being alert, timely processing which suspect affected integrity of her swallow. Slower transit of honey thick barium allowed for laryngeal vestibue to close without significant residue (trace-min). Esophagus viewed however brief due to body habitus which did not reveal remarkable findings. Recommend upgrade liquid consistency to honey thick given inconsistent nature of penetration and sensation with thinner consistencies. Continue Dys 3 texture, crush pills, full supervision and assist with meals. Reviewed results with pt's sister. Prognosis is  good for swallow improvements once cognition and overall medical status improves.    SLP Visit Diagnosis Dysphagia, oropharyngeal phase (R13.12) Attention and concentration deficit following -- Frontal lobe and executive function deficit following -- Impact on safety and function Mild aspiration risk;Moderate aspiration risk   CHL IP TREATMENT RECOMMENDATION 10/17/2019 Treatment Recommendations Therapy as outlined in treatment plan below   Prognosis 10/17/2019 Prognosis for Safe Diet Advancement Good Barriers to Reach Goals Cognitive deficits Barriers/Prognosis Comment -- CHL IP DIET RECOMMENDATION 10/17/2019 SLP Diet Recommendations Dysphagia 3 (Mech soft) solids;Honey thick liquids Liquid Administration via Cup Medication Administration Crushed with puree Compensations Minimize environmental distractions;Slow rate;Small sips/bites  Postural Changes Seated upright at 90 degrees   CHL IP OTHER RECOMMENDATIONS 10/17/2019 Recommended Consults -- Oral Care Recommendations Oral care BID Other Recommendations --   CHL IP FOLLOW UP RECOMMENDATIONS 10/17/2019 Follow up Recommendations Other (comment)   CHL IP FREQUENCY AND DURATION 10/17/2019 Speech Therapy Frequency (ACUTE ONLY) min 2x/week Treatment Duration 2 weeks      CHL IP ORAL PHASE 10/17/2019 Oral Phase Impaired Oral - Pudding Teaspoon -- Oral - Pudding Cup -- Oral - Honey Teaspoon -- Oral - Honey Cup Delayed oral transit;Weak lingual manipulation;Reduced posterior propulsion Oral - Nectar Teaspoon -- Oral - Nectar Cup Delayed oral transit;Weak lingual manipulation;Reduced posterior propulsion Oral - Nectar Straw Delayed oral transit;Weak lingual manipulation;Reduced posterior propulsion Oral - Thin Teaspoon -- Oral - Thin Cup Delayed oral transit;Weak lingual manipulation;Reduced posterior propulsion Oral - Thin Straw -- Oral - Puree -- Oral - Mech Soft Delayed oral transit Oral - Regular -- Oral - Multi-Consistency -- Oral - Pill -- Oral Phase - Comment --  CHL IP PHARYNGEAL PHASE 10/17/2019 Pharyngeal Phase Impaired Pharyngeal- Pudding Teaspoon -- Pharyngeal -- Pharyngeal- Pudding Cup -- Pharyngeal -- Pharyngeal- Honey Teaspoon -- Pharyngeal -- Pharyngeal- Honey Cup Pharyngeal residue - valleculae Pharyngeal -- Pharyngeal- Nectar Teaspoon -- Pharyngeal -- Pharyngeal- Nectar Cup Penetration/Aspiration during swallow;Other (Comment);Delayed swallow initiation-vallecula Pharyngeal Material enters airway, remains ABOVE vocal cords and not ejected out Pharyngeal- Nectar Straw Delayed swallow initiation-vallecula;Penetration/Aspiration during swallow Pharyngeal Material enters airway, passes BELOW cords without attempt by patient to eject out (silent aspiration) Pharyngeal- Thin Teaspoon -- Pharyngeal -- Pharyngeal- Thin Cup Penetration/Aspiration during swallow Pharyngeal -- Pharyngeal- Thin Straw --  Pharyngeal -- Pharyngeal- Puree -- Pharyngeal -- Pharyngeal- Mechanical Soft WFL Pharyngeal -- Pharyngeal- Regular -- Pharyngeal -- Pharyngeal- Multi-consistency -- Pharyngeal -- Pharyngeal- Pill -- Pharyngeal -- Pharyngeal Comment --  CHL IP CERVICAL ESOPHAGEAL PHASE 10/17/2019 Cervical Esophageal Phase WFL Pudding Teaspoon -- Pudding Cup -- Honey Teaspoon -- Honey Cup -- Nectar Teaspoon -- Nectar Cup -- Nectar Straw -- Thin Teaspoon -- Thin Cup -- Thin Straw -- Puree -- Mechanical Soft -- Regular -- Multi-consistency -- Pill -- Cervical Esophageal Comment -- [name] 10/17/2019, 2:52 PM Orbie Pyo Litaker M.Ed Actor Pager [phone] Office [phone]              VAS [place] LOWER EXTREMITY VENOUS (DVT)  Result Date: 10/17/2019  Lower Venous DVTStudy Indications: Edema.  Limitations: Body habitus and Edema, patient grabbing my hand/probe. altered mental status, significant pitting edema. Comparison Study: No prior study on file Performing Technologist: [facility]  Examination Guidelines: A complete evaluation includes B-mode imaging, spectral Doppler, color Doppler, and power Doppler as needed of all accessible portions of each vessel. Bilateral testing is considered an integral part of a complete examination. Limited examinations for reoccurring indications may be performed as noted. The reflux portion  of the exam is performed with the patient in reverse Trendelenburg.  +---------+---------------+---------+-----------+----------+-------------------+ RIGHT    CompressibilityPhasicitySpontaneityPropertiesThrombus Aging      +---------+---------------+---------+-----------+----------+-------------------+ CFV      Full           Yes      Yes                                      +---------+---------------+---------+-----------+----------+-------------------+ SFJ      Full                                                              +---------+---------------+---------+-----------+----------+-------------------+ FV Prox  Full                                                             +---------+---------------+---------+-----------+----------+-------------------+ FV Mid                  Yes      Yes                  patent by color and                                                       Doppler             +---------+---------------+---------+-----------+----------+-------------------+ FV Distal               Yes      Yes                  patent by color and                                                       Doppler             +---------+---------------+---------+-----------+----------+-------------------+ POP                     Yes      Yes                  patent by color and                                                       Doppler             +---------+---------------+---------+-----------+----------+-------------------+ PTV      Full                                                             +---------+---------------+---------+-----------+----------+-------------------+  PERO     Full                                                             +---------+---------------+---------+-----------+----------+-------------------+   +---------+---------------+---------+-----------+----------+-------------------+ LEFT     CompressibilityPhasicitySpontaneityPropertiesThrombus Aging      +---------+---------------+---------+-----------+----------+-------------------+ CFV                     Yes      Yes                  patent by color and                                                       Doppler             +---------+---------------+---------+-----------+----------+-------------------+ SFJ                                                   Not visualized      +---------+---------------+---------+-----------+----------+-------------------+ FV  Prox                 Yes      Yes                  patent by color and                                                       Doppler             +---------+---------------+---------+-----------+----------+-------------------+ FV Mid                  Yes      Yes                  patent by color and                                                       Doppler             +---------+---------------+---------+-----------+----------+-------------------+ FV Distal               Yes      Yes                  patent by color and                                                       Doppler             +---------+---------------+---------+-----------+----------+-------------------+  PFV                     Yes      Yes                  patent by color and                                                       Doppler             +---------+---------------+---------+-----------+----------+-------------------+ PTV                     Yes      Yes                  patent by color and                                                       Doppler             +---------+---------------+---------+-----------+----------+-------------------+ PERO                                                  Not visualized      +---------+---------------+---------+-----------+----------+-------------------+     Summary: RIGHT: - There is no evidence of deep vein thrombosis in the lower extremity. However, portions of this examination were limited- see technologist comments above.  LEFT: - There is no evidence of deep vein thrombosis in the lower extremity. However, portions of this examination were limited- see technologist comments above.  *See table(s) above for measurements and observations. Electronically signed by Servando Snare MD on 10/17/2019 at 12:51:35 AM.    Final     Medications:   . sodium chloride   Intravenous Once  . calcitRIOL  2 mcg Oral Daily  .  diclofenac Sodium  2 g Topical QID  . levothyroxine  400 mcg Oral Q0600  . loratadine  10 mg Oral BH-q7a  . mouth rinse  15 mL Mouth Rinse BID  . mirabegron ER  50 mg Oral Daily  . mometasone-formoterol  2 puff Inhalation BID  . montelukast  10 mg Oral QHS  . pantoprazole  40 mg Oral BID  . polyethylene glycol  17 g Oral Daily  . senna-docusate  1 tablet Oral Daily  . Vitamin D (Ergocalciferol)  50,000 Units Oral Once per day on Mon Wed Fri   Continuous Infusions: . aztreonam 1 g (10/17/19 1211)  . lactated ringers 100 mL/hr at 10/17/19 0245     LOS: 3 days   Kerry Odonohue  [facility]     10/17/2019, 5:05 PM

## 2019-10-17 NOTE — Significant Event (Signed)
Rapid Response Event Note  Overview: Called d/t soft BPs and increased lethargy t/o shift. Per RN, pt lethargy has progressed quickly over the last hour and pt now unable to move LUE.   Initial Focused Assessment: Pt laying in bed with eyes closed, will open eyes to verbal stimulation. Pt oriented to self and year but thinks she is in a doctor's office and unclear of why. Pt will move all extremities on command, denies pain/SOB. Pt closes eyes quickly after she answers questions. Pupils 4 and brisk. Skin warm and dry. T-98.2 HR-77, BP-110/39, RR-14, SpO2-99% on RA, CBG-70.  Interventions: CBG-70> 1/2 amp D50 to be given ABG Ammonia  Plan of Care (if not transferred): Give D50, await ABG and ammonia results, and give blood as soon as available. Alert NP if results abnormal. Continue to monitor mental status closely. Call RRT if further assistance needed.  Event Summary:  Sharlet Salina, NP notified by bedside RN at [facility]: 1925 Arrived: 6 Ended: 1945  Dillard Essex

## 2019-10-18 DIAGNOSIS — M25561 Pain in right knee: Secondary | ICD-10-CM

## 2019-10-18 DIAGNOSIS — G8929 Other chronic pain: Secondary | ICD-10-CM

## 2019-10-18 DIAGNOSIS — M25569 Pain in unspecified knee: Secondary | ICD-10-CM

## 2019-10-18 LAB — CBC
HCT: 28.2 % — ABNORMAL LOW (ref 36.0–46.0)
Hemoglobin: 8.3 g/dL — ABNORMAL LOW (ref 12.0–15.0)
MCH: 27.9 pg (ref 26.0–34.0)
MCHC: 29.4 g/dL — ABNORMAL LOW (ref 30.0–36.0)
MCV: 94.6 fL (ref 80.0–100.0)
Platelets: UNDETERMINED 10*3/uL (ref 150–400)
RBC: 2.98 MIL/uL — ABNORMAL LOW (ref 3.87–5.11)
RDW: 18.6 % — ABNORMAL HIGH (ref 11.5–15.5)
WBC: 5.3 10*3/uL (ref 4.0–10.5)
nRBC: 0 % (ref 0.0–0.2)

## 2019-10-18 LAB — CBC WITH DIFFERENTIAL/PLATELET
Abs Immature Granulocytes: 0.06 10*3/uL (ref 0.00–0.07)
Abs Immature Granulocytes: 0.05 10*3/uL (ref 0.00–0.07)
Basophils Absolute: 0 10*3/uL (ref 0.0–0.1)
Basophils Absolute: 0 10*3/uL (ref 0.0–0.1)
Basophils Relative: 0 %
Basophils Relative: 1 %
Eosinophils Absolute: 0.1 10*3/uL (ref 0.0–0.5)
Eosinophils Absolute: 0.2 10*3/uL (ref 0.0–0.5)
Eosinophils Relative: 3 %
Eosinophils Relative: 3 %
HCT: 26.6 % — ABNORMAL LOW (ref 36.0–46.0)
HCT: 28.3 % — ABNORMAL LOW (ref 36.0–46.0)
Hemoglobin: 7.9 g/dL — ABNORMAL LOW (ref 12.0–15.0)
Hemoglobin: 8.4 g/dL — ABNORMAL LOW (ref 12.0–15.0)
Immature Granulocytes: 1 %
Immature Granulocytes: 1 %
Lymphocytes Relative: 10 %
Lymphocytes Relative: 9 %
Lymphs Abs: 0.5 10*3/uL — ABNORMAL LOW (ref 0.7–4.0)
Lymphs Abs: 0.5 10*3/uL — ABNORMAL LOW (ref 0.7–4.0)
MCH: 28 pg (ref 26.0–34.0)
MCH: 28.3 pg (ref 26.0–34.0)
MCHC: 29.7 g/dL — ABNORMAL LOW (ref 30.0–36.0)
MCHC: 29.7 g/dL — ABNORMAL LOW (ref 30.0–36.0)
MCV: 94.3 fL (ref 80.0–100.0)
MCV: 95.3 fL (ref 80.0–100.0)
Monocytes Absolute: 0.4 10*3/uL (ref 0.1–1.0)
Monocytes Absolute: 0.5 10*3/uL (ref 0.1–1.0)
Monocytes Relative: 8 %
Monocytes Relative: 9 %
Neutro Abs: 3.5 10*3/uL (ref 1.7–7.7)
Neutro Abs: 4 10*3/uL (ref 1.7–7.7)
Neutrophils Relative %: 78 %
Neutrophils Relative %: 77 %
Platelets: UNDETERMINED 10*3/uL (ref 150–400)
Platelets: UNDETERMINED 10*3/uL (ref 150–400)
RBC: 2.82 MIL/uL — ABNORMAL LOW (ref 3.87–5.11)
RBC: 2.97 MIL/uL — ABNORMAL LOW (ref 3.87–5.11)
RDW: 18.4 % — ABNORMAL HIGH (ref 11.5–15.5)
RDW: 18.6 % — ABNORMAL HIGH (ref 11.5–15.5)
WBC: 4.5 10*3/uL (ref 4.0–10.5)
WBC: 5.2 10*3/uL (ref 4.0–10.5)
nRBC: 0.4 % — ABNORMAL HIGH (ref 0.0–0.2)
nRBC: 0.4 % — ABNORMAL HIGH (ref 0.0–0.2)

## 2019-10-18 LAB — MAGNESIUM: Magnesium: 1.9 mg/dL (ref 1.7–2.4)

## 2019-10-18 LAB — BASIC METABOLIC PANEL
Anion gap: 10 (ref 5–15)
BUN: 39 mg/dL — ABNORMAL HIGH (ref 8–23)
CO2: 20 mmol/L — ABNORMAL LOW (ref 22–32)
Calcium: 8.5 mg/dL — ABNORMAL LOW (ref 8.9–10.3)
Chloride: 114 mmol/L — ABNORMAL HIGH (ref 98–111)
Creatinine, Ser: 2.1 mg/dL — ABNORMAL HIGH (ref 0.44–1.00)
GFR calc Af Amer: 29 mL/min — ABNORMAL LOW (ref >60)
GFR calc non Af Amer: 25 mL/min — ABNORMAL LOW (ref >60)
Glucose, Bld: 69 mg/dL — ABNORMAL LOW (ref 70–99)
Potassium: 3.8 mmol/L (ref 3.5–5.1)
Sodium: 144 mmol/L (ref 135–145)

## 2019-10-18 LAB — MRSA PCR SCREENING: MRSA by PCR: POSITIVE — AB

## 2019-10-18 LAB — GLUCOSE, CAPILLARY: Glucose-Capillary: 94 mg/dL (ref 70–99)

## 2019-10-18 LAB — AMMONIA: Ammonia: 36 umol/L — ABNORMAL HIGH (ref 9–35)

## 2019-10-18 MED ORDER — CHLORHEXIDINE GLUCONATE CLOTH 2 % EX PADS
6.0000 | MEDICATED_PAD | Freq: Every day | CUTANEOUS | Status: DC
  Administered 2019-10-19: 6 via TOPICAL

## 2019-10-18 MED ORDER — LACTULOSE 10 GM/15ML PO SOLN
10.0000 g | Freq: Three times a day (TID) | ORAL | Status: DC
  Administered 2019-10-18 – 2019-10-20 (×6): 10 g via ORAL
  Filled 2019-10-18 (×6): qty 15

## 2019-10-18 MED ORDER — SODIUM CHLORIDE 0.9 % IV SOLN: INTRAVENOUS | Status: AC

## 2019-10-18 MED ORDER — MUPIROCIN 2 % EX OINT
1.0000 "application " | TOPICAL_OINTMENT | Freq: Two times a day (BID) | CUTANEOUS | Status: DC
  Administered 2019-10-18 – 2019-10-22 (×8): 1 via NASAL
  Filled 2019-10-18 (×3): qty 22

## 2019-10-18 NOTE — Progress Notes (Signed)
PROGRESS NOTE    [name]  [id number] DOB: 1955-08-16 DOA: 10/14/2019 PCP: Debbrah Alar, NP   Brief Narrative:  64 y.o. female with a history of PAF on eliquis, gastric bypass with iron deficiency anemia, hypothyroidism, chronic HFpEF, HTN, COPD/asthma, anxiety, depression and morbid obesity who presented to the ED with increasing confusion and generalized weakness as well as right knee pain after falling on it during a transfer at home. She also complained of dysuria but was confused on arrival. She was hypothermic, hypotensive with hgb 7.8, down from baseline, normocytic, and platelets 43k down from normal previously, creatinine 2 up from 1.4, UA with hematuria, pyuria, bacteriuria, CXR no active disease. CT head without acute abnormality. CT renal stone study showed nonobstructing nephrolithiasis with mild prominence of the left renal pelvis with perinephric edema.CT also showing large volume stool throughout the colon concerning for constipation. No bowel obstruction or inflammation. Liver with nodular hepatic contour, can be seen with cirrhosis. Mild splenomegaly, unchanged.She was given cefepime, 2L LR, tramadol and voltaren gel. Platelets have continued downward trend and hematology is consulted.   Assessment & Plan:   Principal Problem:   UTI (urinary tract infection) Active Problems:   Anemia   Sepsis ([place])   Hypothermia   Thrombocytopenia ([facility])   AKI (acute kidney injury) ([place])   Sepsis secondary to UTI:= E. Coli Acute metabolic encephalopathy -This appears to be improving as the infection improves.  On IV aztreonam.  Extensive allergy list. -Supportive care.  Neurochecks. -Gentle hydration. -Accu-Cheks every 6 hours -CT head negative. Elevated Ammonia, on lactulose TID  Thrombocytopenia  anemia of chronic disease -Platelets today are clumped.  Hematology team following. -No obvious evidence of bleeding at the moment. -Hold Eliquis while platelets  less than 50. -Methylmalonic acid, homocysteine levels-pending -Copper levels. -Status post PRBC transfusion 5/23.  Elevated d-dimer, leg edema:  No evidence of lower extremity DVT on venous duplex.  Acute kidney injury -Appears to be increasing you know.  Lasix and Aldactone on hold -Gentle hydration. -Monitor creatinine levels.  Right knee pain:Knee pain started after her fall. X-ray showing large joint effusion without evidence of an acute displaced fracture or dislocation.  - Continue pain management with Tylenol, Voltaren el. Will consult Ortho.   Generalized weakness:  - PT/OT  Dysphagia:  - SLP evaluation  Chronic diastolic CHF: Compensated.  BNP wnl, no pulmonary edema, appears volume down.   PAF:  - Holding diltiazem with hypotension and controlled rate (NSR) - Hold eliquis w/severe thrombocytopenia  Hypothyroidism: TSH normal. -Continue homesynthroid452mcg  Constipation:CT also showing large volume stool throughout the colon concerning for constipation. No bowel obstruction or inflammation.  Laxatives as needed.  COPD/asthma: No exacerbation.  - continue BD.   PT/OT= HH Speech = Dys 3 diet.    DVT prophylaxis: SCDs Code Status: Full  Family Communication:  Sister in Law at Bedside   Status is: Inpatient  Remains inpatient appropriate because:Hemodynamically unstable   Dispo: The patient is from: Home              Anticipated d/c is to: Home              Anticipated d/c date is: 2 days              Patient currently is not medically stable to d/c. Still little confused. Plts are dropping. Also needs joint aspiration of her knee.   Subjective: Feels better but according to the family she is still confused in regards to where  she is in. Still feels quit weak overall.   Review of Systems Otherwise negative except as per HPI, including: General: Denies fever, chills, night sweats or unintended weight loss. Resp: Denies cough,  wheezing, shortness of breath. Cardiac: Denies chest pain, palpitations, orthopnea, paroxysmal nocturnal dyspnea. GI: Denies abdominal pain, nausea, vomiting, diarrhea or constipation GU: Denies dysuria, frequency, hesitancy or incontinence MS: Denies muscle aches, joint pain or swelling Neuro: Denies headache, neurologic deficits (focal weakness, numbness, tingling), abnormal gait Psych: Denies anxiety, depression, SI/HI/AVH Skin: Denies new rashes or lesions ID: Denies sick contacts, exotic exposures, travel  Examination:  General exam: Appears calm and comfortable  Respiratory system: bibasilar crackles.  Cardiovascular system: S1 & S2 heard, RRR. No JVD, murmurs, rubs, gallops or clicks. No pedal edema. Gastrointestinal system: Abdomen is nondistended, soft and nontender. No organomegaly or masses felt. Normal bowel sounds heard. Central nervous system: Alert and oriented. No focal neurological deficits. Extremities: Symmetric 5 x 5 power. Skin: No rashes, lesions or ulcers Psychiatry: Poor judgement and insight. AAO x 2 (name and place).     Objective: Vitals:   10/18/19 0300 10/18/19 0330 10/18/19 0400 10/18/19 0750  BP:    (!) 113/41  Pulse:    68  Resp: 17  16   Temp:  98.2 F (36.8 C) 98 F (36.7 C) 98 F (36.7 C)  TempSrc:   Oral Oral  SpO2:    97%  Weight:      Height:        Intake/Output Summary (Last 24 hours) at 10/18/2019 0814 Last data filed at 10/18/2019 0451 Gross per 24 hour  Intake 1497 ml  Output 600 ml  Net 897 ml   Filed Weights   10/14/19 1300 10/15/19 0111  Weight: 108.9 kg 111 kg     Data Reviewed:   CBC: Recent Labs  Lab 10/14/19 1330 10/14/19 1338 10/14/19 2311 10/15/19 0446 10/16/19 0753 10/16/19 2049 10/17/19 1346  WBC 5.9  --  5.2 4.7 3.2*  --  5.7  NEUTROABS 5.3  --   --   --   --   --  4.7  HGB 7.8*   < > 7.3* 7.2* 6.5* 7.4* 7.0*  HCT 26.7*   < > 25.3* 24.6* 23.6* 25.2* 24.0*  MCV 94.7  --  95.1 94.6 100.9*  --  94.5    PLT 43*  --  28* 25*  30* 46*  --  28*   < > = values in this interval not displayed.   Basic Metabolic Panel: Recent Labs  Lab 10/14/19 1330 10/14/19 1338 10/16/19 0753 10/17/19 1346  NA 141 138 141 143  K 4.1 3.8 3.7 4.2  CL 105  --  113* 112*  CO2 18*  --  17* 21*  GLUCOSE 85  --  97 99  BUN 70*  --  53* 42*  CREATININE 2.06*  --  2.16* 2.09*  CALCIUM 8.2*  --  8.0* 8.4*  MG  --   --   --  1.8   GFR: Estimated Creatinine Clearance: 34 mL/min (A) (by C-G formula based on SCr of 2.09 mg/dL (H)). Liver Function Tests: Recent Labs  Lab 10/14/19 1330 10/16/19 0753  AST 21 25  ALT 24 22  ALKPHOS 125 105  BILITOT 1.4* 0.6  PROT 6.4* 5.5*  ALBUMIN 2.5* 2.0*   No results for input(s): LIPASE, AMYLASE in the last 168 hours. Recent Labs  Lab 10/17/19 2006  AMMONIA 41*   Coagulation Profile: Recent Labs  Lab 10/14/19 1330 10/15/19 0446  INR 2.2* 2.2*   Cardiac Enzymes: No results for input(s): CKTOTAL, CKMB, CKMBINDEX, TROPONINI in the last 168 hours. BNP (last 3 results) No results for input(s): PROBNP in the last 8760 hours. HbA1C: No results for input(s): HGBA1C in the last 72 hours. CBG: Recent Labs  Lab 10/17/19 1933 10/17/19 2012  GLUCAP 70 121*   Lipid Profile: No results for input(s): CHOL, HDL, LDLCALC, TRIG, CHOLHDL, LDLDIRECT in the last 72 hours. Thyroid Function Tests: No results for input(s): TSH, T4TOTAL, FREET4, T3FREE, THYROIDAB in the last 72 hours. Anemia Panel: No results for input(s): VITAMINB12, FOLATE, FERRITIN, TIBC, IRON, RETICCTPCT in the last 72 hours. Sepsis Labs: Recent Labs  Lab 10/14/19 1330  LATICACIDVEN 1.1    Recent Results (from the past 240 hour(s))  Blood Culture (routine x 2)     Status: None (Preliminary result)   Collection Time: 10/14/19  1:20 PM   Specimen: BLOOD  Result Value Ref Range Status   Specimen Description BLOOD RIGHT ANTECUBITAL  Final   Special Requests   Final    BOTTLES DRAWN AEROBIC AND  ANAEROBIC Blood Culture results may not be optimal due to an inadequate volume of blood received in culture bottles   Culture   Final    NO GROWTH 3 DAYS Performed at [facility], [place] [address], [place], [place] [address]    Report Status PENDING  Incomplete  Blood Culture (routine x 2)     Status: None (Preliminary result)   Collection Time: 10/14/19  1:25 PM   Specimen: BLOOD RIGHT HAND  Result Value Ref Range Status   Specimen Description BLOOD RIGHT HAND  Final   Special Requests   Final    BOTTLES DRAWN AEROBIC AND ANAEROBIC Blood Culture adequate volume   Culture   Final    NO GROWTH 3 DAYS Performed at [facility], [place] [address] [place], [place] [address]    Report Status PENDING  Incomplete  Urine culture     Status: Abnormal   Collection Time: 10/14/19  3:10 PM   Specimen: In/Out Cath Urine  Result Value Ref Range Status   Specimen Description IN/OUT CATH URINE  Final   Special Requests   Final    NONE Performed at [facility], [place] [address] [place], [place] [address]    Culture >=100,000 COLONIES/mL ESCHERICHIA COLI (A)  Final   Report Status 10/16/2019 FINAL  Final   Organism ID, Bacteria ESCHERICHIA COLI (A)  Final      Susceptibility   Escherichia coli - MIC*    AMPICILLIN 4 SENSITIVE Sensitive     CEFAZOLIN <=4 SENSITIVE Sensitive     CEFTRIAXONE <=1 SENSITIVE Sensitive     CIPROFLOXACIN <=0.25 SENSITIVE Sensitive     GENTAMICIN <=1 SENSITIVE Sensitive     IMIPENEM <=0.25 SENSITIVE Sensitive     NITROFURANTOIN <=16 SENSITIVE Sensitive     TRIMETH/SULFA <=20 SENSITIVE Sensitive     AMPICILLIN/SULBACTAM 4 SENSITIVE Sensitive     PIP/TAZO <=4 SENSITIVE Sensitive     * >=100,000 COLONIES/mL ESCHERICHIA COLI  SARS Coronavirus 2 by RT PCR (hospital order, performed in [place] hospital lab) Nasopharyngeal Nasopharyngeal Swab     Status: None   Collection Time: 10/14/19  5:55 PM   Specimen: Nasopharyngeal Swab  Result Value Ref Range  Status   SARS Coronavirus 2 NEGATIVE NEGATIVE Final    Comment: (NOTE) SARS-CoV-2 target nucleic acids are NOT DETECTED. The SARS-CoV-2  RNA is generally detectable in upper and lower respiratory specimens during the acute phase of infection. The lowest concentration of SARS-CoV-2 viral copies this assay can detect is 250 copies / mL. A negative result does not preclude SARS-CoV-2 infection and should not be used as the sole basis for treatment or other patient management decisions.  A negative result may occur with improper specimen collection / handling, submission of specimen other than nasopharyngeal swab, presence of viral mutation(s) within the areas targeted by this assay, and inadequate number of viral copies (<250 copies / mL). A negative result must be combined with clinical observations, patient history, and epidemiological information. Fact Sheet for Patients:   StrictlyIdeas.no Fact Sheet for Healthcare Providers: BankingDealers.co.za This test is not yet approved or cleared  by the [place] FDA and has been authorized for detection and/or diagnosis of SARS-CoV-2 by FDA under an Emergency Use Authorization (EUA).  This EUA will remain in effect (meaning this test can be used) for the duration of the COVID-19 declaration under Section 564(b)(1) of the Act, 21 U.S.C. section 360bbb-3(b)(1), unless the authorization is terminated or revoked sooner. Performed at [facility], [place] [address], [place], [place] [address]          Radiology Studies: DG Swallowing Func-Speech Pathology  Result Date: 10/17/2019 Objective Swallowing Evaluation: Type of Study: MBS-Modified Barium Swallow Study  Patient Details Name: MARISLEYSIS ROGGENKAMP MRN: [id number] Date of Birth: 06-Feb-1956 Today's Date: 10/17/2019 Time: SLP Start Time (ACUTE ONLY): 1309 -SLP Stop Time (ACUTE ONLY): 1326 SLP Time Calculation (min) (ACUTE ONLY): 17 min Past  Medical History: Past Medical History: Diagnosis Date . Abnormal Pap smear   years ago/no biopsy . Allergic rhinitis  . Aortic atherosclerosis  . Arthritis   back- lower . Asthma  . Atrial fibrillation 12/2010  OFF XARELTO LAST MONTH DUE TO BLEEDING IN STOOL . Bursitis of hip left . Chronic anticoagulation 12/16/2018  CHADS VASC=2 for sex and H/O HTN- she is on Eliquis . Chronic diastolic (congestive) heart failure   pt unaware of this . COPD (chronic obstructive pulmonary disease) with chronic bronchitis  . Decreased pulses in feet  . Dysrhythmia   afib, followed by Dr. Stanford Breed  . Edema of both legs  . Endometrial adenocarcinoma 02/21/2013  S/p D and C, has mirena, being followed by GYN  . Esophagitis 02/02/2014  Distal, linear erosions, noted on endoscopy . Essential hypertension 01/05/2007  Echo Nov June 2020- EF 50-55%, mild LVH, normal LA  . Fatty liver 01/07/2012 . Fibroid 1974  fibroid cyst on left fallopian tube . Fibromyalgia  . Foot ulcer   AREA HEALED RIGHT FOOT . Generalized anxiety disorder  . GERD (gastroesophageal reflux disease)  . Headache   occasional sinus headache  . Hepatomegaly  . History of blood transfusion JULY 2015 . History of cardiomegaly 12/06/2010  Noted on CT . History of E. coli septicemia  . History of papillary thyroid carcinoma 04/07/2011 . Hypocalcemia 12/10/2016 . Hypokalemia 12/10/2016 . Hypoparathyroidism 01/07/2011 . Hypothyroidism  . Iron deficiency anemia secondary to malabsorption  05/10/2007  Qualifier: Diagnosis of  By: Wynona Luna   . Kidney stone 09/25/2015  passed on their own . Leukocytosis 03/24/2009  cta cheQualifier: Diagnosis of  By: Wynona Luna   . Major depressive disorder  . Mild neurocognitive disorder 06/28/2019 . Morbid obesity  . MRSA infection  . Nephrolithiasis 2/16, 9/16  SEES DR Risa Grill . Osteomyelitis 06/14/2014 . PAF (paroxysmal atrial fibrillation) 02/05/2011  Documented 2012- NSR since.  On chronic anticoagulation. . Pernicious anemia 02/20/2014   followed by Debbrah Alar . Pneumonia  . PONV (postoperative nausea and vomiting)  . Pyelonephritis 11/24/2018 . Rash   thighs and back . Seizures   infancy secondary to fever . Staph aureus infection  . Thoracic spondylosis 12/06/2010  Noted on CT . Uterine cancer 2014  Mirena IUD . UTI (urinary tract infection) 12/13/2018 . Vitamin B 12 deficiency  . Vitamin D deficiency 05/10/2007  Qualifier: Diagnosis of  By: Wynona Luna  Past Surgical History: Past Surgical History: Procedure Laterality Date . AMPUTATION Right 05/18/2014  Procedure: RIGHT FIFTH RAY AMPUTATION FOOT;  Surgeon: Wylene Simmer, MD;  Location: [facility];  Service: Orthopedics;  Laterality: Right; . APPENDECTOMY   . BUNIONECTOMY    bilateral . COLONOSCOPY   . COLONOSCOPY WITH PROPOFOL N/A 02/02/2014  Procedure: COLONOSCOPY WITH PROPOFOL;  Surgeon: Irene Shipper, MD;  Location: [facility];  Service: Endoscopy;  Laterality: N/A; . cyst on ovary removed    . CYSTOSCOPY W/ RETROGRADES  03/03/2012  Procedure: CYSTOSCOPY WITH RETROGRADE PYELOGRAM;  Surgeon: Bernestine Amass, MD;  Location: [facility];  Service: Urology;  Laterality: Bilateral; . CYSTOSCOPY W/ URETERAL STENT PLACEMENT Right 11/25/2017  Procedure: CYSTOSCOPY WITH RETROGRADE RIGHT URETERAL STENT PLACEMENT;  Surgeon: Franchot Gallo, MD;  Location: [facility];  Service: Urology;  Laterality: Right; . CYSTOSCOPY W/ URETERAL STENT PLACEMENT Right 01/15/2018  Procedure: RIGHT STENT EXCHANGE;  Surgeon: Ardis Hughs, MD;  Location: [facility];  Service: Urology;  Laterality: Right; . CYSTOSCOPY WITH RETROGRADE PYELOGRAM, URETEROSCOPY AND STENT PLACEMENT Left 08/25/2012  Procedure: CYSTOSCOPY WITH RETROGRADE PYELOGRAM, URETEROSCOPY;  Surgeon: Bernestine Amass, MD;  Location[facility];  Service: Urology;  Laterality: Left; . CYSTOSCOPY WITH STENT PLACEMENT Left 01/15/2018  Procedure: LEFT STENT PLACEMENT;  Surgeon: Ardis Hughs, MD;  Location: [facility];  Service: Urology;  Laterality: Left; . CYSTOSCOPY WITH  STENT PLACEMENT Right 01/22/2018  Procedure: RIGHT STENT REMOVAL;  Surgeon: Ardis Hughs, MD;  Location: [facility];  Service: Urology;  Laterality: Right; . CYSTOSCOPY/URETEROSCOPY/HOLMIUM LASER/STENT PLACEMENT Right 12/23/2017  Procedure: RIGHT URETEROSCOPY STONE REMOVAL, HOLMIUM LASER, RIGHT /STENT EXCHANGE;  Surgeon: Ardis Hughs, MD;  [facility];  Service: Urology;  Laterality: Right; . CYSTOSCOPY/URETEROSCOPY/HOLMIUM LASER/STENT PLACEMENT Left 01/22/2018  Procedure: LEFT URETEROSCOPY STONE REMOVAL HOLMIUM LASER LEFT STENT Freddi Starr;  Surgeon: Ardis Hughs, MD;  [facility];  Service: Urology;  Laterality: Left; . DILATION AND CURETTAGE OF UTERUS N/A 02/21/2013  Procedure: DILATATION AND CURETTAGE;  Surgeon: Lyman Speller, MD;  Location: [facility];  Service: Gynecology;  Laterality: N/A; . DILATION AND CURETTAGE OF UTERUS N/A 04/09/2015  Procedure: [place] IUD removal;  Surgeon: Megan Salon, MD;  Location: [facility];  Service: Gynecology;  Laterality: N/A;  Patient weight 307lbs . ESOPHAGOGASTRODUODENOSCOPY (EGD) WITH PROPOFOL N/A 02/02/2014  Procedure: ESOPHAGOGASTRODUODENOSCOPY (EGD) WITH PROPOFOL;  Surgeon: Irene Shipper, MD;  [facility];  Service: Endoscopy;  Laterality: N/A; . GASTRIC BYPASS  1974 . HOLMIUM LASER APPLICATION Left 0000000  Procedure: HOLMIUM LASER APPLICATION;  Surgeon: Bernestine Amass, MD;  [facility];  Service: Urology;  Laterality: Left; . LITHOTRIPSY  03/2012 . LITHOTRIPSY  2/16 . THYROIDECTOMY  05/15/2010 . TONSILLECTOMY AND ADENOIDECTOMY   . UPPER GASTROINTESTINAL ENDOSCOPY   . URETEROSCOPY  03/03/2012  Procedure: URETEROSCOPY;  Surgeon: Bernestine Amass, MD;  [facility];  Service: Urology;  Laterality: Left; . URETEROSCOPY WITH  HOLMIUM LASER LITHOTRIPSY Bilateral 01/15/2018  Procedure: CYSTOSCOPY/BILATERAL URETEROSCOPY WITH HOLMIUM LASER LITHOTRIPSY STONE REMOVAL;  Surgeon: Ardis Hughs, MD;   Location[facility];  Service: Urology;  Laterality: Bilateral; HPI: AMAL CLOWES is a 64 y.o. female with medical history significant of paroxysmal A. fib, chronic diastolic CHF, COPD, history of thyroidectomy on Synthroid, GERD, hypertension, asthma, anxiety, depression, morbid obesity (BMI 41.19) presenting to the ED via EMS for evaluation of generalized weakness and altered mental status. She has not been eating much for the past few days.  Patient has also complained of choking to family when eating dry items such as toast. MRI was negative for acute abnormality and CXR was unremarkable.  Subjective: Pt was alert and pleasantly confused with sister and niece at bedside Assessment / Plan / Recommendation CHL IP CLINICAL IMPRESSIONS 10/17/2019 Clinical Impression Pt's oropharyngeal dysphagia was inconsistent in relation to impaired timing and coordination of swallow sequence resulting in penetration/aspiration. When bolus was large and laryngeal closure delayed thin and nectar barium entered vestibule and either remained above the vocal cords, contacted cords or was aspirated (nectar x 1) with and without sensation (delayed cough x 2 throughout study). Other trials with smaller bolus and and laryngeal closure timely penetration/aspiration was prevented. Chin tuck was of no benefit. Orally pt exhibited anterior and posterior movement of boluses with delayed and incoordinated prolusion. Pt was awake however continues with being alert, timely processing which suspect affected integrity of her swallow. Slower transit of honey thick barium allowed for laryngeal vestibue to close without significant residue (trace-min). Esophagus viewed however brief due to body habitus which did not reveal remarkable findings. Recommend upgrade liquid consistency to honey thick given inconsistent nature of penetration and sensation with thinner consistencies. Continue Dys 3 texture, crush pills, full supervision and assist with meals.  Reviewed results with pt's sister. Prognosis is good for swallow improvements once cognition and overall medical status improves.    SLP Visit Diagnosis Dysphagia, oropharyngeal phase (R13.12) Attention and concentration deficit following -- Frontal lobe and executive function deficit following -- Impact on safety and function Mild aspiration risk;Moderate aspiration risk   CHL IP TREATMENT RECOMMENDATION 10/17/2019 Treatment Recommendations Therapy as outlined in treatment plan below   Prognosis 10/17/2019 Prognosis for Safe Diet Advancement Good Barriers to Reach Goals Cognitive deficits Barriers/Prognosis Comment -- CHL IP DIET RECOMMENDATION 10/17/2019 SLP Diet Recommendations Dysphagia 3 (Mech soft) solids;Honey thick liquids Liquid Administration via Cup Medication Administration Crushed with puree Compensations Minimize environmental distractions;Slow rate;Small sips/bites Postural Changes Seated upright at 90 degrees   CHL IP OTHER RECOMMENDATIONS 10/17/2019 Recommended Consults -- Oral Care Recommendations Oral care BID Other Recommendations --   CHL IP FOLLOW UP RECOMMENDATIONS 10/17/2019 Follow up Recommendations Other (comment)   CHL IP FREQUENCY AND DURATION 10/17/2019 Speech Therapy Frequency (ACUTE ONLY) min 2x/week Treatment Duration 2 weeks      CHL IP ORAL PHASE 10/17/2019 Oral Phase Impaired Oral - Pudding Teaspoon -- Oral - Pudding Cup -- Oral - Honey Teaspoon -- Oral - Honey Cup Delayed oral transit;Weak lingual manipulation;Reduced posterior propulsion Oral - Nectar Teaspoon -- Oral - Nectar Cup Delayed oral transit;Weak lingual manipulation;Reduced posterior propulsion Oral - Nectar Straw Delayed oral transit;Weak lingual manipulation;Reduced posterior propulsion Oral - Thin Teaspoon -- Oral - Thin Cup Delayed oral transit;Weak lingual manipulation;Reduced posterior propulsion Oral - Thin Straw -- Oral - Puree -- Oral - Mech Soft Delayed oral transit Oral - Regular -- Oral - Multi-Consistency -- Oral  - Pill -- Oral Phase -  Comment --  CHL IP PHARYNGEAL PHASE 10/17/2019 Pharyngeal Phase Impaired Pharyngeal- Pudding Teaspoon -- Pharyngeal -- Pharyngeal- Pudding Cup -- Pharyngeal -- Pharyngeal- Honey Teaspoon -- Pharyngeal -- Pharyngeal- Honey Cup Pharyngeal residue - valleculae Pharyngeal -- Pharyngeal- Nectar Teaspoon -- Pharyngeal -- Pharyngeal- Nectar Cup Penetration/Aspiration during swallow;Other (Comment);Delayed swallow initiation-vallecula Pharyngeal Material enters airway, remains ABOVE vocal cords and not ejected out Pharyngeal- Nectar Straw Delayed swallow initiation-vallecula;Penetration/Aspiration during swallow Pharyngeal Material enters airway, passes BELOW cords without attempt by patient to eject out (silent aspiration) Pharyngeal- Thin Teaspoon -- Pharyngeal -- Pharyngeal- Thin Cup Penetration/Aspiration during swallow Pharyngeal -- Pharyngeal- Thin Straw -- Pharyngeal -- Pharyngeal- Puree -- Pharyngeal -- Pharyngeal- Mechanical Soft WFL Pharyngeal -- Pharyngeal- Regular -- Pharyngeal -- Pharyngeal- Multi-consistency -- Pharyngeal -- Pharyngeal- Pill -- Pharyngeal -- Pharyngeal Comment --  CHL IP CERVICAL ESOPHAGEAL PHASE 10/17/2019 Cervical Esophageal Phase WFL Pudding Teaspoon -- Pudding Cup -- Honey Teaspoon -- Honey Cup -- Nectar Teaspoon -- Nectar Cup -- Nectar Straw -- Thin Teaspoon -- Thin Cup -- Thin Straw -- Puree -- Mechanical Soft -- Regular -- Multi-consistency -- Pill -- Cervical Esophageal Comment -- [name] 10/17/2019, 2:52 PM Orbie Pyo Litaker M.Ed Actor Pager [phone] Office [phone]              VAS [place] LOWER EXTREMITY VENOUS (DVT)  Result Date: 10/17/2019  Lower Venous DVTStudy Indications: Edema.  Limitations: Body habitus and Edema, patient grabbing my hand/probe. altered mental status, significant pitting edema. Comparison Study: No prior study on file Performing Technologist: Sharion Dove RVS  Examination Guidelines: A complete  evaluation includes B-mode imaging, spectral Doppler, color Doppler, and power Doppler as needed of all accessible portions of each vessel. Bilateral testing is considered an integral part of a complete examination. Limited examinations for reoccurring indications may be performed as noted. The reflux portion of the exam is performed with the patient in reverse Trendelenburg.  +---------+---------------+---------+-----------+----------+-------------------+ RIGHT    CompressibilityPhasicitySpontaneityPropertiesThrombus Aging      +---------+---------------+---------+-----------+----------+-------------------+ CFV      Full           Yes      Yes                                      +---------+---------------+---------+-----------+----------+-------------------+ SFJ      Full                                                             +---------+---------------+---------+-----------+----------+-------------------+ FV Prox  Full                                                             +---------+---------------+---------+-----------+----------+-------------------+ FV Mid                  Yes      Yes                  patent by color and  Doppler             +---------+---------------+---------+-----------+----------+-------------------+ FV Distal               Yes      Yes                  patent by color and                                                       Doppler             +---------+---------------+---------+-----------+----------+-------------------+ POP                     Yes      Yes                  patent by color and                                                       Doppler             +---------+---------------+---------+-----------+----------+-------------------+ PTV      Full                                                              +---------+---------------+---------+-----------+----------+-------------------+ PERO     Full                                                             +---------+---------------+---------+-----------+----------+-------------------+   +---------+---------------+---------+-----------+----------+-------------------+ LEFT     CompressibilityPhasicitySpontaneityPropertiesThrombus Aging      +---------+---------------+---------+-----------+----------+-------------------+ CFV                     Yes      Yes                  patent by color and                                                       Doppler             +---------+---------------+---------+-----------+----------+-------------------+ SFJ                                                   Not visualized      +---------+---------------+---------+-----------+----------+-------------------+ FV Prox                 Yes      Yes  patent by color and                                                       Doppler             +---------+---------------+---------+-----------+----------+-------------------+ FV Mid                  Yes      Yes                  patent by color and                                                       Doppler             +---------+---------------+---------+-----------+----------+-------------------+ FV Distal               Yes      Yes                  patent by color and                                                       Doppler             +---------+---------------+---------+-----------+----------+-------------------+ PFV                     Yes      Yes                  patent by color and                                                       Doppler             +---------+---------------+---------+-----------+----------+-------------------+ PTV                     Yes      Yes                  patent by color and                                                        Doppler             +---------+---------------+---------+-----------+----------+-------------------+ PERO                                                  Not visualized      +---------+---------------+---------+-----------+----------+-------------------+     Summary: RIGHT: - There  is no evidence of deep vein thrombosis in the lower extremity. However, portions of this examination were limited- see technologist comments above.  LEFT: - There is no evidence of deep vein thrombosis in the lower extremity. However, portions of this examination were limited- see technologist comments above.  *See table(s) above for measurements and observations. Electronically signed by Servando Snare MD on 10/17/2019 at 12:51:35 AM.    Final         Scheduled Meds: . sodium chloride   Intravenous Once  . sodium chloride   Intravenous Once  . calcitRIOL  2 mcg Oral Daily  . diclofenac Sodium  2 g Topical QID  . lactulose  10 g Oral BID  . levothyroxine  400 mcg Oral Q0600  . loratadine  10 mg Oral BH-q7a  . mouth rinse  15 mL Mouth Rinse BID  . mirabegron ER  50 mg Oral Daily  . mometasone-formoterol  2 puff Inhalation BID  . montelukast  10 mg Oral QHS  . pantoprazole  40 mg Oral BID  . polyethylene glycol  17 g Oral Daily  . senna-docusate  1 tablet Oral Daily  . Vitamin D (Ergocalciferol)  50,000 Units Oral Once per day on Mon Wed Fri   Continuous Infusions: . aztreonam Stopped (10/18/19 0730)     LOS: 4 days   Time spent= 35 mins    Braidyn Peace Arsenio Loader, MD [facility]  If 7PM-7AM, please contact night-coverage  10/18/2019, 8:14 AM

## 2019-10-18 NOTE — Progress Notes (Signed)
Occupational Therapy Treatment Patient Details Name: [name] MRN: [id number] DOB: 1955/08/20 Today's Date: 10/18/2019    History of present illness 64 y.o. female with medical history significant of paroxysmal A. fib, chronic diastolic CHF, COPD, history of thyroidectomy on Synthroid, GERD, hypertension, asthma, anxiety, depression, morbid obesity (BMI 41.19) presenting to the ED via EMS for evaluation of generalized weakness and altered mental status. Pt admitted for UTI.   OT comments  Patient continues to make steady progress towards goals in skilled OT session. Patient's session encompassed attempts to transfer OOB in order to increase endurance and activity tolerance. Pt remains pleasantly confused, but hides it well in her dry wit and humor. Pt is reliant on step by step multi-modal cues in order to complete baseline tasks, and fatigues quickly. Pt completed 3 sit<>stand transfers with Stedy and max A of 2 but was unable to clear hips. Encouraged family member to promote use of lift equipment with nursing and chair position in bed for maintain strength. May need to revisit SNF placement if progress is not made; will continue to follow acutely.    Follow Up Recommendations  Home health OT;Supervision/Assistance - 24 hour    Equipment Recommendations  3 in 1 bedside commode;Other (comment)    Recommendations for Other Services      Precautions / Restrictions Precautions Precautions: Fall Restrictions Weight Bearing Restrictions: No       Mobility Bed Mobility Overal bed mobility: Needs Assistance Bed Mobility: Rolling;Supine to Sit;Sit to Supine Rolling: Max assist;+2 for safety/equipment   Supine to sit: Max assist;+2 for physical assistance;HOB elevated Sit to supine: Max assist;+2 for physical assistance   General bed mobility comments: helicopter method using bed pad to transition to/from sitting EOB  Transfers Overall transfer level: Needs assistance    Transfers: Sit to/from Stand Sit to Stand: +2 physical assistance;Max assist         General transfer comment: Sit to stand x 3 trial from EOB with Stedy. Despite the use of equipment, gait belt and bed pad at hips, pt was unable to tilt hips to stand to bring flaps underneath, nursing made aware of need of lift equipment OOB    Balance Overall balance assessment: Needs assistance Sitting-balance support: Feet supported;No upper extremity supported Sitting balance-Leahy Scale: Poor Sitting balance - Comments: posteiror lean at times; facilitation of anterior weight shift however pt had difficulty maintianing postural control affected by poor attention Postural control: Posterior lean Standing balance support: Bilateral upper extremity supported;During functional activity Standing balance-Leahy Scale: Zero Standing balance comment: reliant on external support                           ADL either performed or assessed with clinical judgement   ADL Overall ADL's : Needs assistance/impaired                     Lower Body Dressing: Total assistance;Sitting/lateral leans;Bed level Lower Body Dressing Details (indicate cue type and reason): Would not attempt to adjust socks sitting EOB     Toileting- Clothing Manipulation and Hygiene: Total assistance;Bed level       Functional mobility during ADLs: Maximal assistance;+2 for physical assistance;+2 for safety/equipment;Cueing for safety;Cueing for sequencing General ADL Comments: Pt attempted sit<>stands x3 with steady, pt requiring step by step cues to reorient to task, however unable to clear hips to bring flaps around to transfer to chair, informed RN pt will need lift to get OOB  Vision       Perception     Praxis      Cognition Arousal/Alertness: Awake/alert Behavior During Therapy: WFL for tasks assessed/performed Overall Cognitive Status: Impaired/Different from baseline Area of Impairment:  Orientation;Attention;Memory;Following commands;Safety/judgement;Awareness;Problem solving                 Orientation Level: Disoriented to;Place;Time;Situation Current Attention Level: Sustained Memory: Decreased recall of precautions;Decreased short-term memory Following Commands: Follows one step commands with increased time Safety/Judgement: Decreased awareness of safety;Decreased awareness of deficits Awareness: Intellectual Problem Solving: Slow processing;Decreased initiation;Difficulty sequencing;Requires verbal cues;Requires tactile cues General Comments: Continues to mask confusion with humor, however is unable to complete basic tasks without step by step prompting        Exercises     Shoulder Instructions       General Comments      Pertinent Vitals/ Pain       Pain Assessment: Faces Faces Pain Scale: Hurts little more Pain Location: BLEs Pain Descriptors / Indicators: Grimacing;Moaning;Discomfort;Crying Pain Intervention(s): Limited activity within patient's tolerance;Monitored during session;Repositioned  Home Living                                          Prior Functioning/Environment              Frequency  Min 3X/week        Progress Toward Goals  OT Goals(current goals can now be found in the care plan section)  Progress towards OT goals: Progressing toward goals  Acute Rehab OT Goals Patient Stated Goal: to get pt stronger so she can participate with family at home OT Goal Formulation: With family Time For Goal Achievement: 10/30/19 Potential to Achieve Goals: Good  Plan Discharge plan remains appropriate    Co-evaluation                 AM-PAC OT "6 Clicks" Daily Activity     Outcome Measure   Help from another person eating meals?: A Little Help from another person taking care of personal grooming?: A Little Help from another person toileting, which includes using toliet, bedpan, or urinal?:  Total Help from another person bathing (including washing, rinsing, drying)?: A Lot Help from another person to put on and taking off regular upper body clothing?: A Lot Help from another person to put on and taking off regular lower body clothing?: Total 6 Click Score: 12    End of Session Equipment Utilized During Treatment: Rolling walker;Gait belt;Other (comment)(Stedy)  OT Visit Diagnosis: Unsteadiness on feet (R26.81);Muscle weakness (generalized) (M62.81);Pain Pain - Right/Left: Right Pain - part of body: Knee   Activity Tolerance Patient limited by fatigue   Patient Left in bed;with call bell/phone within reach;with bed alarm set   Nurse Communication Mobility status(Lift OOB)        [id number] OT Time Calculation (min): 30 min  Charges: OT General Charges $OT Visit: 1 Visit OT Treatments $Self Care/Home Management : 23-37 mins  [place]. Kyler Germer, COTA/L Acute Rehabilitation Services [place] 10/18/2019, 2:14 PM

## 2019-10-18 NOTE — Plan of Care (Signed)

## 2019-10-18 NOTE — Progress Notes (Signed)
  Speech Language Pathology Treatment: Dysphagia  Patient Details Name: [name] MRN: [id number] DOB: [birth year] Today's Date: 10/18/2019 Time: 0950-1030 SLP Time Calculation (min) (ACUTE ONLY): 40 min  Assessment / Plan / Recommendation Clinical Impression  Pt seen for follow up after MBS completed yesterday. Family present. SLP reviewed results from MBS, explaining rationale for honey thick liquids. SLP reviewed and practiced oropharyngeal strengthening exercises with pt/family, and encouraged her to complete them several times throughout the day. Written exercises also provided. SLP will continue per plan of care.    HPI HPI: [name] is a 64 y.o. female with medical history significant of paroxysmal A. fib, chronic diastolic CHF, COPD, history of thyroidectomy on Synthroid, GERD, hypertension, asthma, anxiety, depression, morbid obesity (BMI 41.19) presenting to the ED via EMS for evaluation of generalized weakness and altered mental status. She has not been eating much for the past few days.  Patient has also complained of choking to family when eating dry items such as toast. MRI was negative for acute abnormality and CXR was unremarkable.       SLP Plan  Continue with current plan of care Water protocol?      Recommendations  Diet recommendations: Dysphagia 3 (mechanical soft);Honey-thick liquid Liquids provided via: Cup;Straw Medication Administration: Crushed with puree Supervision: Patient able to self feed;Intermittent supervision to cue for compensatory strategies Compensations: Minimize environmental distractions;Slow rate;Small sips/bites Postural Changes and/or Swallow Maneuvers: Seated upright 90 degrees                Oral Care Recommendations: Oral care BID Follow up Recommendations: Other (comment)(TBD) SLP Visit Diagnosis: Dysphagia, oropharyngeal phase (R13.12) Plan: Continue with current plan of care       GO               Karmah Potocki B.  Quentin Ore, [facility], [place] Speech Language Pathologist Office: [phone]  Shonna Chock 10/18/2019, 10:33 AM

## 2019-10-18 NOTE — Social Work (Signed)
CSW left HIPAA compliant message for pt sister in law Carnell Bronner at [phone].  Westley Hummer, MSW, [place] Work

## 2019-10-18 NOTE — Consult Note (Signed)
Reason for Consult: Right knee pain with effusion Referring Physician: Dr Jorja Loa is an 64 y.o. female.  HPI: The patient is a 64 year old female who was hospitalized several days ago due to generalized weakness and altered mental status.  She has been on the hospitalist service due to several medical issues.  She reports that she has had knee pain for some time now.  The family also at the bedside reports that she has had chronic right knee pain and left knee pain.  Apparently sometime prior to admission she had a mechanical fall and injured her right knee.  X-rays were obtained and were negative for fracture but it does show a large joint effusion of her right knee.  Orthopedic surgery is consulted due to continued right knee pain while the patient is still in the hospital.  She has not had surgery on this knee before.  She is on blood thinning medication.  She states that both knees hurt but her right knee is the one that has been swelling.   Past Medical History:  Diagnosis Date  . Abnormal Pap smear    years ago/no biopsy  . Allergic rhinitis   . Aortic atherosclerosis   . Arthritis    back- lower  . Asthma   . Atrial fibrillation 12/2010   OFF XARELTO LAST MONTH DUE TO BLEEDING IN STOOL  . Bursitis of hip left  . Chronic anticoagulation 12/16/2018   CHADS VASC=2 for sex and H/O HTN- she is on Eliquis  . Chronic diastolic (congestive) heart failure    pt unaware of this  . COPD (chronic obstructive pulmonary disease) with chronic bronchitis   . Decreased pulses in feet   . Dysrhythmia    afib, followed by Dr. Stanford Breed   . Edema of both legs   . Endometrial adenocarcinoma 02/21/2013   S/p D and C, has mirena, being followed by GYN   . Esophagitis 02/02/2014   Distal, linear erosions, noted on endoscopy  . Essential hypertension 01/05/2007   Echo Nov June 2020- EF 50-55%, mild LVH, normal LA   . Fatty liver 01/07/2012  . Fibroid 1974   fibroid cyst on left fallopian  tube  . Fibromyalgia   . Foot ulcer    AREA HEALED RIGHT FOOT  . Generalized anxiety disorder   . GERD (gastroesophageal reflux disease)   . Headache    occasional sinus headache   . Hepatomegaly   . History of blood transfusion JULY 2015  . History of cardiomegaly 12/06/2010   Noted on CT  . History of E. coli septicemia   . History of papillary thyroid carcinoma 04/07/2011  . Hypocalcemia 12/10/2016  . Hypokalemia 12/10/2016  . Hypoparathyroidism 01/07/2011  . Hypothyroidism   . Iron deficiency anemia secondary to malabsorption  05/10/2007   Qualifier: Diagnosis of  By: Wynona Luna    . Kidney stone 09-28-2015   passed on their own  . Leukocytosis 03/24/2009   cta cheQualifier: Diagnosis of  By: Wynona Luna    . Major depressive disorder   . Mild neurocognitive disorder 06/28/2019  . Morbid obesity   . MRSA infection   . Nephrolithiasis 2/16, 9/16   SEES DR Risa Grill  . Osteomyelitis 06/14/2014  . PAF (paroxysmal atrial fibrillation) 02/05/2011   Documented 2012- NSR since.  On chronic anticoagulation.  . Pernicious anemia 02/20/2014   followed by Debbrah Alar  . Pneumonia   . PONV (postoperative nausea and vomiting)   .  Pyelonephritis 11/24/2018  . Rash    thighs and back  . Seizures    infancy secondary to fever  . Staph aureus infection   . Thoracic spondylosis 12/06/2010   Noted on CT  . Uterine cancer 2014   Mirena IUD  . UTI (urinary tract infection) 12/13/2018  . Vitamin B 12 deficiency   . Vitamin D deficiency 05/10/2007   Qualifier: Diagnosis of  By: Wynona Luna     Past Surgical History:  Procedure Laterality Date  . AMPUTATION Right 05/18/2014   Procedure: RIGHT FIFTH RAY AMPUTATION FOOT;  Surgeon: Wylene Simmer, MD;  Location: [place];  Service: Orthopedics;  Laterality: Right;  . APPENDECTOMY    . BUNIONECTOMY     bilateral  . COLONOSCOPY    . COLONOSCOPY WITH PROPOFOL N/A 02/02/2014   Procedure: COLONOSCOPY WITH PROPOFOL;  Surgeon:  Irene Shipper, MD;  [facility];  Service: Endoscopy;  Laterality: N/A;  . cyst on ovary removed     . CYSTOSCOPY W/ RETROGRADES  03/03/2012   Procedure: CYSTOSCOPY WITH RETROGRADE PYELOGRAM;  Surgeon: Bernestine Amass, MD;  [facility];  Service: Urology;  Laterality: Bilateral;  . CYSTOSCOPY W/ URETERAL STENT PLACEMENT Right 11/25/2017   Procedure: CYSTOSCOPY WITH RETROGRADE RIGHT URETERAL STENT PLACEMENT;  Surgeon: Franchot Gallo, MD;  Location: [place];  Service: Urology;  Laterality: Right;  . CYSTOSCOPY W/ URETERAL STENT PLACEMENT Right 01/15/2018   Procedure: RIGHT STENT EXCHANGE;  Surgeon: Ardis Hughs, MD;  [facility];  Service: Urology;  Laterality: Right;  . CYSTOSCOPY WITH RETROGRADE PYELOGRAM, URETEROSCOPY AND STENT PLACEMENT Left 08/25/2012   Procedure: CYSTOSCOPY WITH RETROGRADE PYELOGRAM, URETEROSCOPY;  Surgeon: Bernestine Amass, MD;  [facility];  Service: Urology;  Laterality: Left;  . CYSTOSCOPY WITH STENT PLACEMENT Left 01/15/2018   Procedure: LEFT STENT PLACEMENT;  Surgeon: Ardis Hughs, MD;  [facility];  Service: Urology;  Laterality: Left;  . CYSTOSCOPY WITH STENT PLACEMENT Right 01/22/2018   Procedure: RIGHT STENT REMOVAL;  Surgeon: Ardis Hughs, MD;  [facility];  Service: Urology;  Laterality: Right;  . CYSTOSCOPY/URETEROSCOPY/HOLMIUM LASER/STENT PLACEMENT Right 12/23/2017   Procedure: RIGHT URETEROSCOPY STONE REMOVAL, HOLMIUM LASER, RIGHT /STENT EXCHANGE;  Surgeon: Ardis Hughs, MD;  [facility];  Service: Urology;  Laterality: Right;  . CYSTOSCOPY/URETEROSCOPY/HOLMIUM LASER/STENT PLACEMENT Left 01/22/2018   Procedure: LEFT URETEROSCOPY STONE REMOVAL HOLMIUM LASER LEFT STENT Freddi Starr;  Surgeon: Ardis Hughs, MD;  [facility];  Service: Urology;  Laterality: Left;  . DILATION AND CURETTAGE OF UTERUS N/A 02/21/2013   Procedure: DILATATION AND CURETTAGE;  Surgeon: Lyman Speller,  MD;  Location: [facility];  Service: Gynecology;  Laterality: N/A;  . DILATION AND CURETTAGE OF UTERUS N/A 04/09/2015   Procedure: [place] IUD removal;  Surgeon: Megan Salon, MD;  Location: [facility];  Service: Gynecology;  Laterality: N/A;  Patient weight 307lbs  . ESOPHAGOGASTRODUODENOSCOPY (EGD) WITH PROPOFOL N/A 02/02/2014   Procedure: ESOPHAGOGASTRODUODENOSCOPY (EGD) WITH PROPOFOL;  Surgeon: Irene Shipper, MD;  [facility];  Service: Endoscopy;  Laterality: N/A;  . GASTRIC BYPASS  1974  . HOLMIUM LASER APPLICATION Left 0000000   Procedure: HOLMIUM LASER APPLICATION;  Surgeon: Bernestine Amass, MD;  [facility];  Service: Urology;  Laterality: Left;  . LITHOTRIPSY  03/2012  . LITHOTRIPSY  2/16  . THYROIDECTOMY  05/15/2010  . TONSILLECTOMY AND ADENOIDECTOMY    . UPPER GASTROINTESTINAL ENDOSCOPY    .  URETEROSCOPY  03/03/2012   Procedure: URETEROSCOPY;  Surgeon: Bernestine Amass, MD;  [facility];  Service: Urology;  Laterality: Left;  . URETEROSCOPY WITH HOLMIUM LASER LITHOTRIPSY Bilateral 01/15/2018   Procedure: CYSTOSCOPY/BILATERAL URETEROSCOPY WITH HOLMIUM LASER LITHOTRIPSY STONE REMOVAL;  Surgeon: Ardis Hughs, MD;  [facility];  Service: Urology;  Laterality: Bilateral;    Family History  Problem Relation Age of Onset  . Hypertension Father   . Diabetes Father   . Lung cancer Father   . Heart attack Father        MI at age 47  . Hypertension Mother   . Hyperthyroidism Mother   . Heart disease Mother   . Heart attack Mother        MI at age 25  . Asthma Brother   . Hypertension Brother        younger  . Heart disease Brother        older  . Dementia Paternal Aunt        Unspecified type; symptoms emerged with advanced age  . Colon cancer Neg Hx   . Esophageal cancer Neg Hx   . Stomach cancer Neg Hx   . Kidney disease Neg Hx   . Liver disease Neg Hx   . Pancreatic cancer Neg Hx     Social History:  reports that she quit  smoking about 24 years ago. Her smoking use included cigarettes. She started smoking about 48 years ago. She has a 12.50 pack-year smoking history. She has never used smokeless tobacco. She reports current alcohol use. She reports that she does not use drugs.  Allergies:  Allergies  Allergen Reactions  . Nutritional Supplements Anaphylaxis  . Other Anaphylaxis    Reaction to tree nuts  . Amoxicillin-Pot Clavulanate Other (See Comments)    headache  . Ciprofloxacin Nausea Only and Rash  . Food Hives and Other (See Comments)    Potato  . Keflex [Cephalexin] Rash  . Penicillins Rash    Headache. And hives - Dose not tolerate Cephalosporin either   Has patient had a PCN reaction causing immediate rash, facial/tongue/throat swelling, SOB or lightheadedness with hypotension: No Has patient had a PCN reaction causing severe rash involving mucus membranes or skin necrosis: No Has patient had a PCN reaction that required hospitalization: No Has patient had a PCN reaction occurring within the last 10 years: Yes (reaction was May 2020) If all of the above answers are "NO", then may proce  . Rocephin [Ceftriaxone] Rash  . Tomato Hives    Medications: I have reviewed the patient's current medications.  Results for orders placed or performed during the hospital encounter of 10/14/19 (from the past 48 hour(s))  Hemoglobin and hematocrit, blood     Status: Abnormal   Collection Time: 10/16/19  8:49 PM  Result Value Ref Range   Hemoglobin 7.4 (L) 12.0 - 15.0 g/dL   HCT 25.2 (L) 36.0 - 46.0 %    Comment: Performed at [facility], [address], [place], [place] [address]  CBC with Differential/Platelet     Status: Abnormal   Collection Time: 10/17/19  1:46 PM  Result Value Ref Range   WBC 5.7 4.0 - 10.5 K/uL   RBC 2.54 (L) 3.87 - 5.11 MIL/uL   Hemoglobin 7.0 (L) 12.0 - 15.0 g/dL   HCT 24.0 (L) 36.0 - 46.0 %   MCV 94.5 80.0 - 100.0 fL   MCH 27.6 26.0 - 34.0 pg   MCHC  29.2 (L) 30.0 -  36.0 g/dL   RDW 19.3 (H) 11.5 - 15.5 %   Platelets 28 (LL) 150 - 400 K/uL    Comment: CONSISTENT WITH PREVIOUS RESULT Immature Platelet Fraction may be clinically indicated, consider ordering this additional test [id number] REPEATED TO VERIFY THIS CRITICAL RESULT HAS VERIFIED AND BEEN CALLED TO J GANOE RN BY KIRSTENE FORSYTH ON 05 24 2021 AT 1508, AND HAS BEEN READ BACK.     nRBC 0.4 (H) 0.0 - 0.2 %   Neutrophils Relative % 82 %   Neutro Abs 4.7 1.7 - 7.7 K/uL   Lymphocytes Relative 7 %   Lymphs Abs 0.4 (L) 0.7 - 4.0 K/uL   Monocytes Relative 7 %   Monocytes Absolute 0.4 0.1 - 1.0 K/uL   Eosinophils Relative 2 %   Eosinophils Absolute 0.1 0.0 - 0.5 K/uL   Basophils Relative 1 %   Basophils Absolute 0.0 0.0 - 0.1 K/uL   Immature Granulocytes 1 %   Abs Immature Granulocytes 0.05 0.00 - 0.07 K/uL    Comment: Performed at [facility] [address], [place], [place] [id number]  Basic metabolic panel     Status: Abnormal   Collection Time: 10/17/19  1:46 PM  Result Value Ref Range   Sodium 143 135 - 145 mmol/L   Potassium 4.2 3.5 - 5.1 mmol/L   Chloride 112 (H) 98 - 111 mmol/L   CO2 21 (L) 22 - 32 mmol/L   Glucose, Bld 99 70 - 99 mg/dL    Comment: Glucose reference range applies only to samples taken after fasting for at least 8 hours.   BUN 42 (H) 8 - 23 mg/dL   Creatinine, Ser 2.09 (H) 0.44 - 1.00 mg/dL   Calcium 8.4 (L) 8.9 - 10.3 mg/dL   GFR calc non Af Amer 25 (L) >60 mL/min   GFR calc Af Amer 29 (L) >60 mL/min   Anion gap 10 5 - 15    Comment: Performed at [facility] [address] [place], [place] [address]  Magnesium     Status: None   Collection Time: 10/17/19  1:46 PM  Result Value Ref Range   Magnesium 1.8 1.7 - 2.4 mg/dL    Comment: Performed at [facility] [address], [place], [place] [address]  Prepare RBC (crossmatch)     Status: None   Collection Time: 10/17/19  5:30 PM  Result Value Ref Range   Order Confirmation      ORDER  PROCESSED BY BLOOD BANK Performed at [facility], [place] [address], [place], [place] [address]   Glucose, capillary     Status: None   Collection Time: 10/17/19  7:33 PM  Result Value Ref Range   Glucose-Capillary 70 70 - 99 mg/dL    Comment: Glucose reference range applies only to samples taken after fasting for at least 8 hours.  Ammonia     Status: Abnormal   Collection Time: 10/17/19  8:06 PM  Result Value Ref Range   Ammonia 41 (H) 9 - 35 umol/L    Comment: Performed at [facility] [address], [place], [place] [address]  Glucose, capillary     Status: Abnormal   Collection Time: 10/17/19  8:12 PM  Result Value Ref Range   Glucose-Capillary 121 (H) 70 - 99 mg/dL    Comment: Glucose reference range applies only to samples taken after fasting for at least 8 hours.  Blood gas, arterial     Status: Abnormal   Collection Time: 10/17/19  8:15 PM  Result Value Ref Range   FIO2 21.00    pH, Arterial 7.445 7.350 - 7.450   pCO2 arterial 29.0 (L) 32.0 - 48.0 mmHg   pO2, Arterial 106 83.0 - 108.0 mmHg   Bicarbonate 19.7 (L) 20.0 - 28.0 mmol/L   Acid-base deficit 3.8 (H) 0.0 - 2.0 mmol/L   O2 Saturation 95.9 %   Patient temperature 36.8    Collection site RIGHT RADIAL    Drawn by [id number]     Comment: COLLECTED BY RT   Sample type ARTERIAL DRAW    [place] test (pass/fail) PASS PASS    Comment: Performed at [facility], [place] [address], [place], [place] [address]  CBC with Differential/Platelet     Status: Abnormal   Collection Time: 10/18/19  7:31 AM  Result Value Ref Range   WBC 4.5 4.0 - 10.5 K/uL   RBC 2.82 (L) 3.87 - 5.11 MIL/uL   Hemoglobin 7.9 (L) 12.0 - 15.0 g/dL   HCT 26.6 (L) 36.0 - 46.0 %   MCV 94.3 80.0 - 100.0 fL   MCH 28.0 26.0 - 34.0 pg   MCHC 29.7 (L) 30.0 - 36.0 g/dL   RDW 18.4 (H) 11.5 - 15.5 %   Platelets PLATELET CLUMPS NOTED ON SMEAR, UNABLE TO ESTIMATE 150 - 400 K/uL    Comment: Immature Platelet Fraction may be clinically indicated,  consider ordering this additional test [id number]    nRBC 0.4 (H) 0.0 - 0.2 %   Neutrophils Relative % 78 %   Neutro Abs 3.5 1.7 - 7.7 K/uL   Lymphocytes Relative 10 %   Lymphs Abs 0.5 (L) 0.7 - 4.0 K/uL   Monocytes Relative 8 %   Monocytes Absolute 0.4 0.1 - 1.0 K/uL   Eosinophils Relative 3 %   Eosinophils Absolute 0.1 0.0 - 0.5 K/uL   Basophils Relative 0 %   Basophils Absolute 0.0 0.0 - 0.1 K/uL   Immature Granulocytes 1 %   Abs Immature Granulocytes 0.06 0.00 - 0.07 K/uL    Comment: Performed at [facility], [place] [address] [place], [place] [id number]  Basic metabolic panel     Status: Abnormal   Collection Time: 10/18/19  7:31 AM  Result Value Ref Range   Sodium 144 135 - 145 mmol/L   Potassium 3.8 3.5 - 5.1 mmol/L   Chloride 114 (H) 98 - 111 mmol/L   CO2 20 (L) 22 - 32 mmol/L   Glucose, Bld 69 (L) 70 - 99 mg/dL    Comment: Glucose reference range applies only to samples taken after fasting for at least 8 hours.   BUN 39 (H) 8 - 23 mg/dL   Creatinine, Ser 2.10 (H) 0.44 - 1.00 mg/dL   Calcium 8.5 (L) 8.9 - 10.3 mg/dL   GFR calc non Af Amer 25 (L) >60 mL/min   GFR calc Af Amer 29 (L) >60 mL/min   Anion gap 10 5 - 15    Comment: Performed at [facility] [address], [place], [place] [address]  MRSA PCR Screening     Status: Abnormal   Collection Time: 10/18/19  7:55 AM   Specimen: Nasopharyngeal  Result Value Ref Range   MRSA by PCR POSITIVE (A) NEGATIVE    Comment:        The GeneXpert MRSA Assay (FDA approved for NASAL specimens only), is one component  of a comprehensive MRSA colonization surveillance program. It is not intended to diagnose MRSA infection nor to guide or monitor treatment for MRSA infections. RESULT CALLED TO, READ BACK BY AND VERIFIED WITH: Mady Gemma RN 11:00 10/18/19 ([email or web address]) Performed at [facility], [place] [address] [place], [place] [address]   CBC     Status: Abnormal   Collection Time: 10/18/19 11:08 AM  Result  Value Ref Range   WBC 5.3 4.0 - 10.5 K/uL   RBC 2.98 (L) 3.87 - 5.11 MIL/uL   Hemoglobin 8.3 (L) 12.0 - 15.0 g/dL   HCT 28.2 (L) 36.0 - 46.0 %   MCV 94.6 80.0 - 100.0 fL   MCH 27.9 26.0 - 34.0 pg   MCHC 29.4 (L) 30.0 - 36.0 g/dL   RDW 18.6 (H) 11.5 - 15.5 %   Platelets PLATELET CLUMPS NOTED ON SMEAR, UNABLE TO ESTIMATE 150 - 400 K/uL    Comment: Immature Platelet Fraction may be clinically indicated, consider ordering this additional test [id number] PLATELET CLUMPING, SUGGEST RECOLLECTION OF SAMPLE IN CITRATE TUBE.    nRBC 0.0 0.0 - 0.2 %    Comment: Performed at [facility], [place] [address], [place], [place] [address]  Magnesium     Status: None   Collection Time: 10/18/19 11:08 AM  Result Value Ref Range   Magnesium 1.9 1.7 - 2.4 mg/dL    Comment: Performed at [facility] [address] [place], [place] [address]  CBC with Differential/Platelet     Status: Abnormal   Collection Time: 10/18/19  1:30 PM  Result Value Ref Range   WBC 5.2 4.0 - 10.5 K/uL   RBC 2.97 (L) 3.87 - 5.11 MIL/uL   Hemoglobin 8.4 (L) 12.0 - 15.0 g/dL   HCT 28.3 (L) 36.0 - 46.0 %   MCV 95.3 80.0 - 100.0 fL   MCH 28.3 26.0 - 34.0 pg   MCHC 29.7 (L) 30.0 - 36.0 g/dL   RDW 18.6 (H) 11.5 - 15.5 %   Platelets PLATELET CLUMPS NOTED ON SMEAR, UNABLE TO ESTIMATE 150 - 400 K/uL    Comment: Immature Platelet Fraction may be clinically indicated, consider ordering this additional test [id number]    nRBC 0.4 (H) 0.0 - 0.2 %   Neutrophils Relative % 77 %   Neutro Abs 4.0 1.7 - 7.7 K/uL   Lymphocytes Relative 9 %   Lymphs Abs 0.5 (L) 0.7 - 4.0 K/uL   Monocytes Relative 9 %   Monocytes Absolute 0.5 0.1 - 1.0 K/uL   Eosinophils Relative 3 %   Eosinophils Absolute 0.2 0.0 - 0.5 K/uL   Basophils Relative 1 %   Basophils Absolute 0.0 0.0 - 0.1 K/uL   Immature Granulocytes 1 %   Abs Immature Granulocytes 0.05 0.00 - 0.07 K/uL    Comment: Performed at [facility], [place] [address]  [place], [place] [address]   *Note: Due to a large number of results and/or encounters for the requested time period, some results have not been displayed. A complete set of results can be found in Results Review.    DG Swallowing Func-Speech Pathology  Result Date: 10/17/2019 Objective Swallowing Evaluation: Type of Study: MBS-Modified Barium Swallow Study  Patient Details Name: YULIA BOSSARD MRN: [id number] Date of Birth: July 26, 1955 Today's Date: 10/17/2019 Time: SLP Start Time (ACUTE ONLY): 1309 -SLP Stop Time (ACUTE ONLY): 1326 SLP Time Calculation (min) (ACUTE ONLY): 17 min Past Medical History: Past Medical History: Diagnosis Date .  Abnormal Pap smear   years ago/no biopsy . Allergic rhinitis  . Aortic atherosclerosis  . Arthritis   back- lower . Asthma  . Atrial fibrillation 12/2010  OFF XARELTO LAST MONTH DUE TO BLEEDING IN STOOL . Bursitis of hip left . Chronic anticoagulation 12/16/2018  CHADS VASC=2 for sex and H/O HTN- she is on Eliquis . Chronic diastolic (congestive) heart failure   pt unaware of this . COPD (chronic obstructive pulmonary disease) with chronic bronchitis  . Decreased pulses in feet  . Dysrhythmia   afib, followed by Dr. Stanford Breed  . Edema of both legs  . Endometrial adenocarcinoma 02/21/2013  S/p D and C, has mirena, being followed by GYN  . Esophagitis 02/02/2014  Distal, linear erosions, noted on endoscopy . Essential hypertension 01/05/2007  Echo Nov June 2020- EF 50-55%, mild LVH, normal LA  . Fatty liver 01/07/2012 . Fibroid 1974  fibroid cyst on left fallopian tube . Fibromyalgia  . Foot ulcer   AREA HEALED RIGHT FOOT . Generalized anxiety disorder  . GERD (gastroesophageal reflux disease)  . Headache   occasional sinus headache  . Hepatomegaly  . History of blood transfusion JULY 2015 . History of cardiomegaly 12/06/2010  Noted on CT . History of E. coli septicemia  . History of papillary thyroid carcinoma 04/07/2011 . Hypocalcemia 12/10/2016 . Hypokalemia 12/10/2016 .  Hypoparathyroidism 01/07/2011 . Hypothyroidism  . Iron deficiency anemia secondary to malabsorption  05/10/2007  Qualifier: Diagnosis of  By: Wynona Luna   . Kidney stone 2015-10-16  passed on their own . Leukocytosis 03/24/2009  cta cheQualifier: Diagnosis of  By: Wynona Luna   . Major depressive disorder  . Mild neurocognitive disorder 06/28/2019 . Morbid obesity  . MRSA infection  . Nephrolithiasis 2/16, 9/16  SEES DR Risa Grill . Osteomyelitis 06/14/2014 . PAF (paroxysmal atrial fibrillation) 02/05/2011  Documented 2012- NSR since.  On chronic anticoagulation. . Pernicious anemia 02/20/2014  followed by Debbrah Alar . Pneumonia  . PONV (postoperative nausea and vomiting)  . Pyelonephritis 11/24/2018 . Rash   thighs and back . Seizures   infancy secondary to fever . Staph aureus infection  . Thoracic spondylosis 12/06/2010  Noted on CT . Uterine cancer 2014  Mirena IUD . UTI (urinary tract infection) 12/13/2018 . Vitamin B 12 deficiency  . Vitamin D deficiency 05/10/2007  Qualifier: Diagnosis of  By: Wynona Luna  Past Surgical History: Past Surgical History: Procedure Laterality Date . AMPUTATION Right 05/18/2014  Procedure: RIGHT FIFTH RAY AMPUTATION FOOT;  Surgeon: Wylene Simmer, MD;  Location: [place];  Service: Orthopedics;  Laterality: Right; . APPENDECTOMY   . BUNIONECTOMY    bilateral . COLONOSCOPY   . COLONOSCOPY WITH PROPOFOL N/A 02/02/2014  Procedure: COLONOSCOPY WITH PROPOFOL;  Surgeon: Irene Shipper, MD;  [facility];  Service: Endoscopy;  Laterality: N/A; . cyst on ovary removed    . CYSTOSCOPY W/ RETROGRADES  03/03/2012  Procedure: CYSTOSCOPY WITH RETROGRADE PYELOGRAM;  Surgeon: Bernestine Amass, MD;  [facility];  Service: Urology;  Laterality: Bilateral; . CYSTOSCOPY W/ URETERAL STENT PLACEMENT Right 11/25/2017  Procedure: CYSTOSCOPY WITH RETROGRADE RIGHT URETERAL STENT PLACEMENT;  Surgeon: Franchot Gallo, MD;  Location: [facility];  Service: Urology;  Laterality: Right; .  CYSTOSCOPY W/ URETERAL STENT PLACEMENT Right 01/15/2018  Procedure: RIGHT STENT EXCHANGE;  Surgeon: Ardis Hughs, MD;  [facility];  Service: Urology;  Laterality: Right; . CYSTOSCOPY WITH RETROGRADE PYELOGRAM, URETEROSCOPY AND STENT PLACEMENT Left 08/25/2012  Procedure: CYSTOSCOPY  WITH RETROGRADE PYELOGRAM, URETEROSCOPY;  Surgeon: Bernestine Amass, MD;  [facility];  Service: Urology;  Laterality: Left; . CYSTOSCOPY WITH STENT PLACEMENT Left 01/15/2018  Procedure: LEFT STENT PLACEMENT;  Surgeon: Ardis Hughs, MD;  [facility];  Service: Urology;  Laterality: Left; . CYSTOSCOPY WITH STENT PLACEMENT Right 01/22/2018  Procedure: RIGHT STENT REMOVAL;  Surgeon: Ardis Hughs, MD;  [facility];  Service: Urology;  Laterality: Right; . CYSTOSCOPY/URETEROSCOPY/HOLMIUM LASER/STENT PLACEMENT Right 12/23/2017  Procedure: RIGHT URETEROSCOPY STONE REMOVAL, HOLMIUM LASER, RIGHT /STENT EXCHANGE;  Surgeon: Ardis Hughs, MD;  [facility];  Service: Urology;  Laterality: Right; . CYSTOSCOPY/URETEROSCOPY/HOLMIUM LASER/STENT PLACEMENT Left 01/22/2018  Procedure: LEFT URETEROSCOPY STONE REMOVAL HOLMIUM LASER LEFT STENT Freddi Starr;  Surgeon: Ardis Hughs, MD;  [facility];  Service: Urology;  Laterality: Left; . DILATION AND CURETTAGE OF UTERUS N/A 02/21/2013  Procedure: DILATATION AND CURETTAGE;  Surgeon: Lyman Speller, MD;  Location: [facility];  Service: Gynecology;  Laterality: N/A; . DILATION AND CURETTAGE OF UTERUS N/A 04/09/2015  Procedure: [place] IUD removal;  Surgeon: Megan Salon, MD;  Location: [facility];  Service: Gynecology;  Laterality: N/A;  Patient weight 307lbs . ESOPHAGOGASTRODUODENOSCOPY (EGD) WITH PROPOFOL N/A 02/02/2014  Procedure: ESOPHAGOGASTRODUODENOSCOPY (EGD) WITH PROPOFOL;  Surgeon: Irene Shipper, MD;  [facility];  Service: Endoscopy;  Laterality: N/A; . GASTRIC BYPASS  1974 . HOLMIUM LASER APPLICATION Left  0000000  Procedure: HOLMIUM LASER APPLICATION;  Surgeon: Bernestine Amass, MD;  [facility];  Service: Urology;  Laterality: Left; . LITHOTRIPSY  03/2012 . LITHOTRIPSY  2/16 . THYROIDECTOMY  05/15/2010 . TONSILLECTOMY AND ADENOIDECTOMY   . UPPER GASTROINTESTINAL ENDOSCOPY   . URETEROSCOPY  03/03/2012  Procedure: URETEROSCOPY;  Surgeon: Bernestine Amass, MD;  [facility];  Service: Urology;  Laterality: Left; . URETEROSCOPY WITH HOLMIUM LASER LITHOTRIPSY Bilateral 01/15/2018  Procedure: CYSTOSCOPY/BILATERAL URETEROSCOPY WITH HOLMIUM LASER LITHOTRIPSY STONE REMOVAL;  Surgeon: Ardis Hughs, MD;  [facility];  Service: Urology;  Laterality: Bilateral; HPI: JOSEY TREICHEL is a 64 y.o. female with medical history significant of paroxysmal A. fib, chronic diastolic CHF, COPD, history of thyroidectomy on Synthroid, GERD, hypertension, asthma, anxiety, depression, morbid obesity (BMI 41.19) presenting to the ED via EMS for evaluation of generalized weakness and altered mental status. She has not been eating much for the past few days.  Patient has also complained of choking to family when eating dry items such as toast. MRI was negative for acute abnormality and CXR was unremarkable.  Subjective: Pt was alert and pleasantly confused with sister and niece at bedside Assessment / Plan / Recommendation [facility] IP CLINICAL IMPRESSIONS 10/17/2019 Clinical Impression Pt's oropharyngeal dysphagia was inconsistent in relation to impaired timing and coordination of swallow sequence resulting in penetration/aspiration. When bolus was large and laryngeal closure delayed thin and nectar barium entered vestibule and either remained above the vocal cords, contacted cords or was aspirated (nectar x 1) with and without sensation (delayed cough x 2 throughout study). Other trials with smaller bolus and and laryngeal closure timely penetration/aspiration was prevented. Chin tuck was of no benefit. Orally pt exhibited anterior and  posterior movement of boluses with delayed and incoordinated prolusion. Pt was awake however continues with being alert, timely processing which suspect affected integrity of her swallow. Slower transit of honey thick barium allowed for laryngeal vestibue to close without significant residue (trace-min). Esophagus viewed however brief due to body habitus which did not reveal remarkable  findings. Recommend upgrade liquid consistency to honey thick given inconsistent nature of penetration and sensation with thinner consistencies. Continue Dys 3 texture, crush pills, full supervision and assist with meals. Reviewed results with pt's sister. Prognosis is good for swallow improvements once cognition and overall medical status improves.    SLP Visit Diagnosis Dysphagia, oropharyngeal phase (R13.12) Attention and concentration deficit following -- Frontal lobe and executive function deficit following -- Impact on safety and function Mild aspiration risk;Moderate aspiration risk   [facility] IP TREATMENT RECOMMENDATION 10/17/2019 Treatment Recommendations Therapy as outlined in treatment plan below   Prognosis 10/17/2019 Prognosis for Safe Diet Advancement Good Barriers to Reach Goals Cognitive deficits Barriers/Prognosis Comment -- [facility] IP DIET RECOMMENDATION 10/17/2019 SLP Diet Recommendations Dysphagia 3 (Mech soft) solids;Honey thick liquids Liquid Administration via Cup Medication Administration Crushed with puree Compensations Minimize environmental distractions;Slow rate;Small sips/bites Postural Changes Seated upright at 90 degrees   [facility] IP OTHER RECOMMENDATIONS 10/17/2019 Recommended Consults -- Oral Care Recommendations Oral care BID Other Recommendations --   [facility] IP FOLLOW UP RECOMMENDATIONS 10/17/2019 Follow up Recommendations Other (comment)   [facility] IP FREQUENCY AND DURATION 10/17/2019 Speech Therapy Frequency (ACUTE ONLY) min 2x/week Treatment Duration 2 weeks      [facility] IP ORAL PHASE 10/17/2019 Oral Phase Impaired Oral -  Pudding Teaspoon -- Oral - Pudding Cup -- Oral - Honey Teaspoon -- Oral - Honey Cup Delayed oral transit;Weak lingual manipulation;Reduced posterior propulsion Oral - Nectar Teaspoon -- Oral - Nectar Cup Delayed oral transit;Weak lingual manipulation;Reduced posterior propulsion Oral - Nectar Straw Delayed oral transit;Weak lingual manipulation;Reduced posterior propulsion Oral - Thin Teaspoon -- Oral - Thin Cup Delayed oral transit;Weak lingual manipulation;Reduced posterior propulsion Oral - Thin Straw -- Oral - Puree -- Oral - Mech Soft Delayed oral transit Oral - Regular -- Oral - Multi-Consistency -- Oral - Pill -- Oral Phase - Comment --  [facility] IP PHARYNGEAL PHASE 10/17/2019 Pharyngeal Phase Impaired Pharyngeal- Pudding Teaspoon -- Pharyngeal -- Pharyngeal- Pudding Cup -- Pharyngeal -- Pharyngeal- Honey Teaspoon -- Pharyngeal -- Pharyngeal- Honey Cup Pharyngeal residue - valleculae Pharyngeal -- Pharyngeal- Nectar Teaspoon -- Pharyngeal -- Pharyngeal- Nectar Cup Penetration/Aspiration during swallow;Other (Comment);Delayed swallow initiation-vallecula Pharyngeal Material enters airway, remains ABOVE vocal cords and not ejected out Pharyngeal- Nectar Straw Delayed swallow initiation-vallecula;Penetration/Aspiration during swallow Pharyngeal Material enters airway, passes BELOW cords without attempt by patient to eject out (silent aspiration) Pharyngeal- Thin Teaspoon -- Pharyngeal -- Pharyngeal- Thin Cup Penetration/Aspiration during swallow Pharyngeal -- Pharyngeal- Thin Straw -- Pharyngeal -- Pharyngeal- Puree -- Pharyngeal -- Pharyngeal- Mechanical Soft WFL Pharyngeal -- Pharyngeal- Regular -- Pharyngeal -- Pharyngeal- Multi-consistency -- Pharyngeal -- Pharyngeal- Pill -- Pharyngeal -- Pharyngeal Comment --  [facility] IP CERVICAL ESOPHAGEAL PHASE 10/17/2019 Cervical Esophageal Phase WFL Pudding Teaspoon -- Pudding Cup -- Honey Teaspoon -- Honey Cup -- Nectar Teaspoon -- Nectar Cup -- Nectar Straw -- Thin Teaspoon --  Thin Cup -- Thin Straw -- Puree -- Mechanical Soft -- Regular -- Multi-consistency -- Pill -- Cervical Esophageal Comment -- [name] 10/17/2019, 2:52 PM Orbie Pyo Litaker M.Ed Actor Pager [phone] Office [phone]               Review of Systems Blood pressure (!) 104/49, pulse 65, temperature 97.7 F (36.5 C), temperature source Oral, resp. rate 12, height 5\' 4"  (1.626 m), weight 111 kg, last menstrual period 06/11/2012, SpO2 98 %. Physical Exam  Constitutional: She is oriented to person, place, and time. She appears well-developed and well-nourished.  Musculoskeletal:  Right knee: Effusion present. Decreased range of motion. Tenderness present over the medial joint line and lateral joint line.  Neurological: She is alert and oriented to person, place, and time.  Skin: Skin is warm and dry.  Psychiatric: She has a normal mood and affect.   On examination, her right knee is swollen but not red.  There is no evidence of infection.  I can actually flex and extend her right knee pretty easily with only some mild pain.  There is patellofemoral crepitation.  The right knee is ligamentously stable.   I did independently review x-rays of her right knee.  She does have an arthritic knee with an effusion but no evidence of fracture.  Assessment/Plan: Acute on chronic right knee pain with an effusion and osteoarthritis.  I did attempt to aspirate fluid from the knee but got minimal fluid.  This may be more of a bloody effusion from being on blood thinning medication.  It was certainly difficult to try to get fluid off of her knee given her body habitus and her pain for me trying to get fluid off the knee.  I would not recommend any other treatment during this hospitalization for her knee other than topical anti-inflammatories that is being put on the knee now.  She can increase activity as tolerated.  We would need to see her back in the office sometime in  follow-up in an environment where we can more easily anesthetize the knee and get fluid off if warranted.  I would not recommend placing a steroid injection in her knee right now in this environment given her other medical issues.  Again, outpatient follow-up for her is recommended.  If there is any other issues do let us know.  Mcarthur Rossetti 10/18/2019, 3:57 PM

## 2019-10-18 NOTE — Progress Notes (Signed)
P.T. call to make aware of family and MD ok for pt to be re-evaluated.

## 2019-10-19 ENCOUNTER — Ambulatory Visit: Payer: 59 | Admitting: Cardiology

## 2019-10-19 LAB — TYPE AND SCREEN
ABO/RH(D): O POS
Antibody Screen: NEGATIVE
Unit division: 0
Unit division: 0

## 2019-10-19 LAB — CULTURE, BLOOD (ROUTINE X 2)
Culture: NO GROWTH
Culture: NO GROWTH
Special Requests: ADEQUATE

## 2019-10-19 LAB — CBC
HCT: 27 % — ABNORMAL LOW (ref 36.0–46.0)
HCT: 28.6 % — ABNORMAL LOW (ref 36.0–46.0)
Hemoglobin: 7.9 g/dL — ABNORMAL LOW (ref 12.0–15.0)
Hemoglobin: 8.3 g/dL — ABNORMAL LOW (ref 12.0–15.0)
MCH: 27.7 pg (ref 26.0–34.0)
MCH: 27.5 pg (ref 26.0–34.0)
MCHC: 29.3 g/dL — ABNORMAL LOW (ref 30.0–36.0)
MCHC: 29 g/dL — ABNORMAL LOW (ref 30.0–36.0)
MCV: 94.7 fL (ref 80.0–100.0)
MCV: 94.7 fL (ref 80.0–100.0)
Platelets: UNDETERMINED 10*3/uL (ref 150–400)
Platelets: DECREASED 10*3/uL (ref 150–400)
RBC: 2.85 MIL/uL — ABNORMAL LOW (ref 3.87–5.11)
RBC: 3.02 MIL/uL — ABNORMAL LOW (ref 3.87–5.11)
RDW: 18.5 % — ABNORMAL HIGH (ref 11.5–15.5)
RDW: 18.3 % — ABNORMAL HIGH (ref 11.5–15.5)
WBC: 4.5 10*3/uL (ref 4.0–10.5)
WBC: 5.9 10*3/uL (ref 4.0–10.5)
nRBC: 0 % (ref 0.0–0.2)
nRBC: 0 % (ref 0.0–0.2)

## 2019-10-19 LAB — BPAM RBC
Blood Product Expiration Date: 202105292359
Blood Product Expiration Date: 202106232359
ISSUE DATE / TIME: 202105231247
ISSUE DATE / TIME: 202105250117
Unit Type and Rh: 5100
Unit Type and Rh: 5100

## 2019-10-19 LAB — GLUCOSE, CAPILLARY
Glucose-Capillary: 74 mg/dL (ref 70–99)
Glucose-Capillary: 64 mg/dL — ABNORMAL LOW (ref 70–99)

## 2019-10-19 LAB — MAGNESIUM: Magnesium: 2 mg/dL (ref 1.7–2.4)

## 2019-10-19 LAB — COPPER, SERUM: Copper: 31 ug/dL — ABNORMAL LOW (ref 80–158)

## 2019-10-19 MED ORDER — SODIUM CHLORIDE 0.9 % IV SOLN
Freq: Every day | INTRAVENOUS | Status: DC
  Filled 2019-10-19 (×4): qty 100

## 2019-10-19 NOTE — Progress Notes (Signed)
PROGRESS NOTE    [name]  [id number] DOB: 07-25-1955 DOA: 10/14/2019 PCP: Debbrah Alar, NP   Brief Narrative:  64 y.o. female with a history of PAF on eliquis, gastric bypass with iron deficiency anemia, hypothyroidism, chronic HFpEF, HTN, COPD/asthma, anxiety, depression and morbid obesity who presented to the ED with increasing confusion and generalized weakness as well as right knee pain after falling on it during a transfer at home. She also complained of dysuria but was confused on arrival. She was hypothermic, hypotensive with hgb 7.8, down from baseline, normocytic, and platelets 43k down from normal previously, creatinine 2 up from 1.4, UA with hematuria, pyuria, bacteriuria, CXR no active disease. CT head without acute abnormality. CT renal stone study showed nonobstructing nephrolithiasis with mild prominence of the left renal pelvis with perinephric edema.CT also showing large volume stool throughout the colon concerning for constipation. No bowel obstruction or inflammation. Liver with nodular hepatic contour, can be seen with cirrhosis. Mild splenomegaly, unchanged.She was given cefepime, 2L LR, tramadol and voltaren gel. Platelets have continued downward trend and hematology is consulted.   Assessment & Plan:   Principal Problem:   UTI (urinary tract infection) Active Problems:   Anemia   Sepsis ([facility])   Hypothermia   Thrombocytopenia ([facility])   AKI (acute kidney injury) ([place])   Knee pain   Sepsis secondary to UTI:= E. Coli Acute metabolic/hepatic encephalopathy -This appears to be improving as the infection improves.  On IV aztreonam.  Extensive allergy list. -Supportive care.  Neurochecks. -Gentle hydration. -Accu-Cheks every 6 hours -CT head negative. -Lactulose 3 times daily  Thrombocytopenia  anemia of chronic disease -Platelets keeps clumping, spoke with Dr. Dorsey-recommended CBC collection in citrate tubing.  No further intervention as  long as platelets greater than 20,000. -No obvious evidence of bleeding at the moment. -Hold Eliquis while platelets less than 50. -Methylmalonic acid, homocysteine levels-normal -Status post PRBC transfusion 5/23.  Copper deficiency -Supplements ordered  Elevated d-dimer, leg edema:  No evidence of lower extremity DVT on venous duplex.  Acute kidney injury -Appears to be increasing you know.  Lasix and Aldactone on hold -Gentle hydration. -Monitor creatinine levels.  Right knee pain secondary to large joint effusion -No evidence of fracture seen on x-ray.  Seen by orthopedic, Dr. Ninfa Linden were attempted aspiration but had difficulty.  Patient does have good flexion and extension therefore recommend discharging the patient with outpatient follow-up in his clinic.  Appreciate his input.  Generalized weakness:  - PT/OT  Dysphagia:  - SLP evaluation  Chronic diastolic CHF: Compensated.  BNP wnl, no pulmonary edema, appears volume down.   PAF:  - Hold eliquis w/severe thrombocytopenia  Hypothyroidism: TSH normal. -Continue homesynthroid485mcg  Constipation:CT also showing large volume stool throughout the colon concerning for constipation. No bowel obstruction or inflammation.  Laxatives as needed.  COPD/asthma: No exacerbation.  - continue BD.   PT/OT= SNF Speech = Dys 3 diet.    DVT prophylaxis: SCDs Code Status: Full  Family Communication: Sister-in-law at bedside.  Status is: Inpatient  Remains inpatient appropriate because:Hemodynamically unstable   Dispo: The patient is from: Home              Anticipated d/c is to: Home              Anticipated d/c date is: 2 days              Patient currently is not medically stable to d/c.  PT recommending SNF.  Currently awaiting platelets  to result.  If platelets are stable she can go to SNF  Subjective: Overall doing better, no complaints.  No acute events overnight.  Family still thinks that  patient has occasional episodes of confusion.  Review of Systems Otherwise negative except as per HPI, including: General: Denies fever, chills, night sweats or unintended weight loss. Resp: Denies cough, wheezing, shortness of breath. Cardiac: Denies chest pain, palpitations, orthopnea, paroxysmal nocturnal dyspnea. GI: Denies abdominal pain, nausea, vomiting, diarrhea or constipation GU: Denies dysuria, frequency, hesitancy or incontinence MS: Denies muscle aches, joint pain or swelling Neuro: Denies headache, neurologic deficits (focal weakness, numbness, tingling), abnormal gait Psych: Denies anxiety, depression, SI/HI/AVH Skin: Denies new rashes or lesions ID: Denies sick contacts, exotic exposures, travel Examination:  Constitutional: Not in acute distress Respiratory: Minimal bibasilar rhonchi Cardiovascular: Normal sinus rhythm, no rubs Abdomen: Nontender nondistended good bowel sounds Musculoskeletal: No edema noted Skin: Superficial skin bruising Neurologic: CN 2-12 grossly intact.  And nonfocal Psychiatric: Poor judgment and insight.  Alert to name and place only. Objective: Vitals:   10/18/19 1700 10/18/19 2003 10/19/19 0746 10/19/19 0922  BP: (!) 119/51  (!) 106/52   Pulse: 64  61   Resp: 11  14   Temp:   98 F (36.7 C)   TempSrc:   Oral   SpO2: 99% 98% 97% 98%  Weight:      Height:        Intake/Output Summary (Last 24 hours) at 10/19/2019 1135 Last data filed at 10/19/2019 0540 Gross per 24 hour  Intake 1317.11 ml  Output 1350 ml  Net -32.89 ml   Filed Weights   10/14/19 1300 10/15/19 0111  Weight: 108.9 kg 111 kg     Data Reviewed:   CBC: Recent Labs  Lab 10/14/19 1330 10/14/19 1338 10/17/19 1346 10/18/19 0731 10/18/19 1108 10/18/19 1330 10/19/19 0337  WBC 5.9   < > 5.7 4.5 5.3 5.2 4.5  NEUTROABS 5.3  --  4.7 3.5  --  4.0  --   HGB 7.8*   < > 7.0* 7.9* 8.3* 8.4* 7.9*  HCT 26.7*   < > 24.0* 26.6* 28.2* 28.3* 27.0*  MCV 94.7   < > 94.5  94.3 94.6 95.3 94.7  PLT 43*   < > 28* PLATELET CLUMPS NOTED ON SMEAR, UNABLE TO ESTIMATE PLATELET CLUMPS NOTED ON SMEAR, UNABLE TO ESTIMATE PLATELET CLUMPS NOTED ON SMEAR, UNABLE TO ESTIMATE PLATELET CLUMPS NOTED ON SMEAR, UNABLE TO ESTIMATE   < > = values in this interval not displayed.   Basic Metabolic Panel: Recent Labs  Lab 10/14/19 1330 10/14/19 1338 10/16/19 0753 10/17/19 1346 10/18/19 0731 10/18/19 1108 10/19/19 0337  NA 141 138 141 143 144  --   --   K 4.1 3.8 3.7 4.2 3.8  --   --   CL 105  --  113* 112* 114*  --   --   CO2 18*  --  17* 21* 20*  --   --   GLUCOSE 85  --  97 99 69*  --   --   BUN 70*  --  53* 42* 39*  --   --   CREATININE 2.06*  --  2.16* 2.09* 2.10*  --   --   CALCIUM 8.2*  --  8.0* 8.4* 8.5*  --   --   MG  --   --   --  1.8  --  1.9 2.0   GFR: Estimated Creatinine Clearance: 33.9 mL/min (A) (by C-G  formula based on SCr of 2.1 mg/dL (H)). Liver Function Tests: Recent Labs  Lab 10/14/19 1330 10/16/19 0753  AST 21 25  ALT 24 22  ALKPHOS 125 105  BILITOT 1.4* 0.6  PROT 6.4* 5.5*  ALBUMIN 2.5* 2.0*   No results for input(s): LIPASE, AMYLASE in the last 168 hours. Recent Labs  Lab 10/17/19 2006 10/18/19 2202  AMMONIA 41* 36*   Coagulation Profile: Recent Labs  Lab 10/14/19 1330 10/15/19 0446  INR 2.2* 2.2*   Cardiac Enzymes: No results for input(s): CKTOTAL, CKMB, CKMBINDEX, TROPONINI in the last 168 hours. BNP (last 3 results) No results for input(s): PROBNP in the last 8760 hours. HbA1C: No results for input(s): HGBA1C in the last 72 hours. CBG: Recent Labs  Lab 10/17/19 1933 10/17/19 2012 10/18/19 2339 10/19/19 0744  GLUCAP 70 121* 94 74   Lipid Profile: No results for input(s): CHOL, HDL, LDLCALC, TRIG, CHOLHDL, LDLDIRECT in the last 72 hours. Thyroid Function Tests: No results for input(s): TSH, T4TOTAL, FREET4, T3FREE, THYROIDAB in the last 72 hours. Anemia Panel: No results for input(s): VITAMINB12, FOLATE, FERRITIN,  TIBC, IRON, RETICCTPCT in the last 72 hours. Sepsis Labs: Recent Labs  Lab 10/14/19 1330  LATICACIDVEN 1.1    Recent Results (from the past 240 hour(s))  Blood Culture (routine x 2)     Status: None (Preliminary result)   Collection Time: 10/14/19  1:20 PM   Specimen: BLOOD  Result Value Ref Range Status   Specimen Description BLOOD RIGHT ANTECUBITAL  Final   Special Requests   Final    BOTTLES DRAWN AEROBIC AND ANAEROBIC Blood Culture results may not be optimal due to an inadequate volume of blood received in culture bottles   Culture   Final    NO GROWTH 4 DAYS Performed at [facility], [place] [address] [place], [place] [address]    Report Status PENDING  Incomplete  Blood Culture (routine x 2)     Status: None (Preliminary result)   Collection Time: 10/14/19  1:25 PM   Specimen: BLOOD RIGHT HAND  Result Value Ref Range Status   Specimen Description BLOOD RIGHT HAND  Final   Special Requests   Final    BOTTLES DRAWN AEROBIC AND ANAEROBIC Blood Culture adequate volume   Culture   Final    NO GROWTH 4 DAYS Performed at [facility], [place] [address], [place], [place] [address]    Report Status PENDING  Incomplete  Urine culture     Status: Abnormal   Collection Time: 10/14/19  3:10 PM   Specimen: In/Out Cath Urine  Result Value Ref Range Status   Specimen Description IN/OUT CATH URINE  Final   Special Requests   Final    NONE Performed at [facility], [place] [address], [place], [place] [address]    Culture >=100,000 COLONIES/mL ESCHERICHIA COLI (A)  Final   Report Status 10/16/2019 FINAL  Final   Organism ID, Bacteria ESCHERICHIA COLI (A)  Final      Susceptibility   Escherichia coli - MIC*    AMPICILLIN 4 SENSITIVE Sensitive     CEFAZOLIN <=4 SENSITIVE Sensitive     CEFTRIAXONE <=1 SENSITIVE Sensitive     CIPROFLOXACIN <=0.25 SENSITIVE Sensitive     GENTAMICIN <=1 SENSITIVE Sensitive     IMIPENEM <=0.25 SENSITIVE Sensitive     NITROFURANTOIN <=16  SENSITIVE Sensitive     TRIMETH/SULFA <=20 SENSITIVE Sensitive     AMPICILLIN/SULBACTAM 4 SENSITIVE Sensitive  PIP/TAZO <=4 SENSITIVE Sensitive     * >=100,000 COLONIES/mL ESCHERICHIA COLI  SARS Coronavirus 2 by RT PCR (hospital order, performed in [facility] hospital lab) Nasopharyngeal Nasopharyngeal Swab     Status: None   Collection Time: 10/14/19  5:55 PM   Specimen: Nasopharyngeal Swab  Result Value Ref Range Status   SARS Coronavirus 2 NEGATIVE NEGATIVE Final    Comment: (NOTE) SARS-CoV-2 target nucleic acids are NOT DETECTED. The SARS-CoV-2 RNA is generally detectable in upper and lower respiratory specimens during the acute phase of infection. The lowest concentration of SARS-CoV-2 viral copies this assay can detect is 250 copies / mL. A negative result does not preclude SARS-CoV-2 infection and should not be used as the sole basis for treatment or other patient management decisions.  A negative result may occur with improper specimen collection / handling, submission of specimen other than nasopharyngeal swab, presence of viral mutation(s) within the areas targeted by this assay, and inadequate number of viral copies (<250 copies / mL). A negative result must be combined with clinical observations, patient history, and epidemiological information. Fact Sheet for Patients:   StrictlyIdeas.no Fact Sheet for Healthcare Providers: BankingDealers.co.za This test is not yet approved or cleared  by the [place] FDA and has been authorized for detection and/or diagnosis of SARS-CoV-2 by FDA under an Emergency Use Authorization (EUA).  This EUA will remain in effect (meaning this test can be used) for the duration of the COVID-19 declaration under Section 564(b)(1) of the Act, 21 U.S.C. section 360bbb-3(b)(1), unless the authorization is terminated or revoked sooner. Performed at [facility], [place] [address]  [place], [place] [address]   MRSA PCR Screening     Status: Abnormal   Collection Time: 10/18/19  7:55 AM   Specimen: Nasopharyngeal  Result Value Ref Range Status   MRSA by PCR POSITIVE (A) NEGATIVE Final    Comment:        The GeneXpert MRSA Assay (FDA approved for NASAL specimens only), is one component of a comprehensive MRSA colonization surveillance program. It is not intended to diagnose MRSA infection nor to guide or monitor treatment for MRSA infections. RESULT CALLED TO, READ BACK BY AND VERIFIED WITH: Mady Gemma RN 11:00 10/18/19 ([email or web address]) Performed at [facility], [place] [address], [place], [place] [address]          Radiology Studies: DG Swallowing Func-Speech Pathology  Result Date: 10/17/2019 Objective Swallowing Evaluation: Type of Study: MBS-Modified Barium Swallow Study  Patient Details Name: [name] MRN: [id number] Date of Birth: 05-07-1956 Today's Date: 10/17/2019 Time: SLP Start Time (ACUTE ONLY): 1309 -SLP Stop Time (ACUTE ONLY): 1326 SLP Time Calculation (min) (ACUTE ONLY): 17 min Past Medical History: Past Medical History: Diagnosis Date . Abnormal Pap smear   years ago/no biopsy . Allergic rhinitis  . Aortic atherosclerosis  . Arthritis   back- lower . Asthma  . Atrial fibrillation 12/2010  OFF XARELTO LAST MONTH DUE TO BLEEDING IN STOOL . Bursitis of hip left . Chronic anticoagulation 12/16/2018  CHADS VASC=2 for sex and H/O HTN- she is on Eliquis . Chronic diastolic (congestive) heart failure   pt unaware of this . COPD (chronic obstructive pulmonary disease) with chronic bronchitis  . Decreased pulses in feet  . Dysrhythmia   afib, followed by Dr. Stanford Breed  . Edema of both legs  . Endometrial adenocarcinoma 02/21/2013  S/p D and C, has mirena, being followed by GYN  . Esophagitis 02/02/2014  Distal, linear  erosions, noted on endoscopy . Essential hypertension 01/05/2007  Echo Nov June 2020- EF 50-55%, mild LVH, normal LA  . Fatty liver 01/07/2012 . Fibroid  1974  fibroid cyst on left fallopian tube . Fibromyalgia  . Foot ulcer   AREA HEALED RIGHT FOOT . Generalized anxiety disorder  . GERD (gastroesophageal reflux disease)  . Headache   occasional sinus headache  . Hepatomegaly  . History of blood transfusion JULY 2015 . History of cardiomegaly 12/06/2010  Noted on CT . History of E. coli septicemia  . History of papillary thyroid carcinoma 04/07/2011 . Hypocalcemia 12/10/2016 . Hypokalemia 12/10/2016 . Hypoparathyroidism 01/07/2011 . Hypothyroidism  . Iron deficiency anemia secondary to malabsorption  05/10/2007  Qualifier: Diagnosis of  By: Wynona Luna   . Kidney stone 10-11-15  passed on their own . Leukocytosis 03/24/2009  cta cheQualifier: Diagnosis of  By: Wynona Luna   . Major depressive disorder  . Mild neurocognitive disorder 06/28/2019 . Morbid obesity  . MRSA infection  . Nephrolithiasis 2/16, 9/16  SEES DR Risa Grill . Osteomyelitis 06/14/2014 . PAF (paroxysmal atrial fibrillation) 02/05/2011  Documented 2012- NSR since.  On chronic anticoagulation. . Pernicious anemia 02/20/2014  followed by Debbrah Alar . Pneumonia  . PONV (postoperative nausea and vomiting)  . Pyelonephritis 11/24/2018 . Rash   thighs and back . Seizures   infancy secondary to fever . Staph aureus infection  . Thoracic spondylosis 12/06/2010  Noted on CT . Uterine cancer 2014  Mirena IUD . UTI (urinary tract infection) 12/13/2018 . Vitamin B 12 deficiency  . Vitamin D deficiency 05/10/2007  Qualifier: Diagnosis of  By: Wynona Luna  Past Surgical History: Past Surgical History: Procedure Laterality Date . AMPUTATION Right 05/18/2014  Procedure: RIGHT FIFTH RAY AMPUTATION FOOT;  Surgeon: Wylene Simmer, MD;  Location: [facility];  Service: Orthopedics;  Laterality: Right; . APPENDECTOMY   . BUNIONECTOMY    bilateral . COLONOSCOPY   . COLONOSCOPY WITH PROPOFOL N/A 02/02/2014  Procedure: COLONOSCOPY WITH PROPOFOL;  Surgeon: Irene Shipper, MD;  Location: [facility];  Service: Endoscopy;   Laterality: N/A; . cyst on ovary removed    . CYSTOSCOPY W/ RETROGRADES  03/03/2012  Procedure: CYSTOSCOPY WITH RETROGRADE PYELOGRAM;  Surgeon: Bernestine Amass, MD;  Location: [facility];  Service: Urology;  Laterality: Bilateral; . CYSTOSCOPY W/ URETERAL STENT PLACEMENT Right 11/25/2017  Procedure: CYSTOSCOPY WITH RETROGRADE RIGHT URETERAL STENT PLACEMENT;  Surgeon: Franchot Gallo, MD;  Location: [facility];  Service: Urology;  Laterality: Right; . CYSTOSCOPY W/ URETERAL STENT PLACEMENT Right 01/15/2018  Procedure: RIGHT STENT EXCHANGE;  Surgeon: Ardis Hughs, MD;  Location: [facility];  Service: Urology;  Laterality: Right; . CYSTOSCOPY WITH RETROGRADE PYELOGRAM, URETEROSCOPY AND STENT PLACEMENT Left 08/25/2012  Procedure: CYSTOSCOPY WITH RETROGRADE PYELOGRAM, URETEROSCOPY;  Surgeon: Bernestine Amass, MD;  Location: [facility];  Service: Urology;  Laterality: Left; . CYSTOSCOPY WITH STENT PLACEMENT Left 01/15/2018  Procedure: LEFT STENT PLACEMENT;  Surgeon: Ardis Hughs, MD;  Location: [facility];  Service: Urology;  Laterality: Left; . CYSTOSCOPY WITH STENT PLACEMENT Right 01/22/2018  Procedure: RIGHT STENT REMOVAL;  Surgeon: Ardis Hughs, MD;  Location: [facility];  Service: Urology;  Laterality: Right; . CYSTOSCOPY/URETEROSCOPY/HOLMIUM LASER/STENT PLACEMENT Right 12/23/2017  Procedure: RIGHT URETEROSCOPY STONE REMOVAL, HOLMIUM LASER, RIGHT /STENT EXCHANGE;  Surgeon: Ardis Hughs, MD;  Location: [facility];  Service: Urology;  Laterality: Right; . CYSTOSCOPY/URETEROSCOPY/HOLMIUM LASER/STENT PLACEMENT Left 01/22/2018  Procedure: LEFT URETEROSCOPY STONE REMOVAL HOLMIUM LASER LEFT STENT Francetta Found  RETROGRADES;  Surgeon: Ardis Hughs, MD;  Location: [facility];  Service: Urology;  Laterality: Left; . DILATION AND CURETTAGE OF UTERUS N/A 02/21/2013  Procedure: DILATATION AND CURETTAGE;  Surgeon: Lyman Speller, MD;  Location: [facility];  Service: Gynecology;  Laterality: N/A; . DILATION AND CURETTAGE OF UTERUS N/A  04/09/2015  Procedure: Regino Ramirez IUD removal;  Surgeon: Megan Salon, MD;  Location: [facility];  Service: Gynecology;  Laterality: N/A;  Patient weight 307lbs . ESOPHAGOGASTRODUODENOSCOPY (EGD) WITH PROPOFOL N/A 02/02/2014  Procedure: ESOPHAGOGASTRODUODENOSCOPY (EGD) WITH PROPOFOL;  Surgeon: Irene Shipper, MD;  [facility];  Service: Endoscopy;  Laterality: N/A; . GASTRIC BYPASS  1974 . HOLMIUM LASER APPLICATION Left 0000000  Procedure: HOLMIUM LASER APPLICATION;  Surgeon: Bernestine Amass, MD;  Location[facility];  Service: Urology;  Laterality: Left; . LITHOTRIPSY  03/2012 . LITHOTRIPSY  2/16 . THYROIDECTOMY  05/15/2010 . TONSILLECTOMY AND ADENOIDECTOMY   . UPPER GASTROINTESTINAL ENDOSCOPY   . URETEROSCOPY  03/03/2012  Procedure: URETEROSCOPY;  Surgeon: Bernestine Amass, MD;  Location[facility];  Service: Urology;  Laterality: Left; . URETEROSCOPY WITH HOLMIUM LASER LITHOTRIPSY Bilateral 01/15/2018  Procedure: CYSTOSCOPY/BILATERAL URETEROSCOPY WITH HOLMIUM LASER LITHOTRIPSY STONE REMOVAL;  Surgeon: Ardis Hughs, MD;  Location: [facility];  Service: Urology;  Laterality: Bilateral; HPI: [name] is a 64 y.o. female with medical history significant of paroxysmal A. fib, chronic diastolic CHF, COPD, history of thyroidectomy on Synthroid, GERD, hypertension, asthma, anxiety, depression, morbid obesity (BMI 41.19) presenting to the ED via EMS for evaluation of generalized weakness and altered mental status. She has not been eating much for the past few days.  Patient has also complained of choking to family when eating dry items such as toast. MRI was negative for acute abnormality and CXR was unremarkable.  Subjective: Pt was alert and pleasantly confused with sister and niece at bedside Assessment / Plan / Recommendation CHL IP CLINICAL IMPRESSIONS 10/17/2019 Clinical Impression Pt's oropharyngeal dysphagia was inconsistent in relation to impaired timing and coordination of swallow sequence  resulting in penetration/aspiration. When bolus was large and laryngeal closure delayed thin and nectar barium entered vestibule and either remained above the vocal cords, contacted cords or was aspirated (nectar x 1) with and without sensation (delayed cough x 2 throughout study). Other trials with smaller bolus and and laryngeal closure timely penetration/aspiration was prevented. Chin tuck was of no benefit. Orally pt exhibited anterior and posterior movement of boluses with delayed and incoordinated prolusion. Pt was awake however continues with being alert, timely processing which suspect affected integrity of her swallow. Slower transit of honey thick barium allowed for laryngeal vestibue to close without significant residue (trace-min). Esophagus viewed however brief due to body habitus which did not reveal remarkable findings. Recommend upgrade liquid consistency to honey thick given inconsistent nature of penetration and sensation with thinner consistencies. Continue Dys 3 texture, crush pills, full supervision and assist with meals. Reviewed results with pt's sister. Prognosis is good for swallow improvements once cognition and overall medical status improves.    SLP Visit Diagnosis Dysphagia, oropharyngeal phase (R13.12) Attention and concentration deficit following -- Frontal lobe and executive function deficit following -- Impact on safety and function Mild aspiration risk;Moderate aspiration risk   CHL IP TREATMENT RECOMMENDATION 10/17/2019 Treatment Recommendations Therapy as outlined in treatment plan below   Prognosis 10/17/2019 Prognosis for Safe Diet Advancement Good Barriers to Reach Goals Cognitive deficits Barriers/Prognosis Comment -- CHL IP DIET RECOMMENDATION 10/17/2019 SLP Diet Recommendations Dysphagia  3 (Mech soft) solids;Honey thick liquids Liquid Administration via Cup Medication Administration Crushed with puree Compensations Minimize environmental distractions;Slow rate;Small sips/bites  Postural Changes Seated upright at 90 degrees   CHL IP OTHER RECOMMENDATIONS 10/17/2019 Recommended Consults -- Oral Care Recommendations Oral care BID Other Recommendations --   CHL IP FOLLOW UP RECOMMENDATIONS 10/17/2019 Follow up Recommendations Other (comment)   CHL IP FREQUENCY AND DURATION 10/17/2019 Speech Therapy Frequency (ACUTE ONLY) min 2x/week Treatment Duration 2 weeks      CHL IP ORAL PHASE 10/17/2019 Oral Phase Impaired Oral - Pudding Teaspoon -- Oral - Pudding Cup -- Oral - Honey Teaspoon -- Oral - Honey Cup Delayed oral transit;Weak lingual manipulation;Reduced posterior propulsion Oral - Nectar Teaspoon -- Oral - Nectar Cup Delayed oral transit;Weak lingual manipulation;Reduced posterior propulsion Oral - Nectar Straw Delayed oral transit;Weak lingual manipulation;Reduced posterior propulsion Oral - Thin Teaspoon -- Oral - Thin Cup Delayed oral transit;Weak lingual manipulation;Reduced posterior propulsion Oral - Thin Straw -- Oral - Puree -- Oral - Mech Soft Delayed oral transit Oral - Regular -- Oral - Multi-Consistency -- Oral - Pill -- Oral Phase - Comment --  CHL IP PHARYNGEAL PHASE 10/17/2019 Pharyngeal Phase Impaired Pharyngeal- Pudding Teaspoon -- Pharyngeal -- Pharyngeal- Pudding Cup -- Pharyngeal -- Pharyngeal- Honey Teaspoon -- Pharyngeal -- Pharyngeal- Honey Cup Pharyngeal residue - valleculae Pharyngeal -- Pharyngeal- Nectar Teaspoon -- Pharyngeal -- Pharyngeal- Nectar Cup Penetration/Aspiration during swallow;Other (Comment);Delayed swallow initiation-vallecula Pharyngeal Material enters airway, remains ABOVE vocal cords and not ejected out Pharyngeal- Nectar Straw Delayed swallow initiation-vallecula;Penetration/Aspiration during swallow Pharyngeal Material enters airway, passes BELOW cords without attempt by patient to eject out (silent aspiration) Pharyngeal- Thin Teaspoon -- Pharyngeal -- Pharyngeal- Thin Cup Penetration/Aspiration during swallow Pharyngeal -- Pharyngeal- Thin Straw --  Pharyngeal -- Pharyngeal- Puree -- Pharyngeal -- Pharyngeal- Mechanical Soft WFL Pharyngeal -- Pharyngeal- Regular -- Pharyngeal -- Pharyngeal- Multi-consistency -- Pharyngeal -- Pharyngeal- Pill -- Pharyngeal -- Pharyngeal Comment --  CHL IP CERVICAL ESOPHAGEAL PHASE 10/17/2019 Cervical Esophageal Phase WFL Pudding Teaspoon -- Pudding Cup -- Honey Teaspoon -- Honey Cup -- Nectar Teaspoon -- Nectar Cup -- Nectar Straw -- Thin Teaspoon -- Thin Cup -- Thin Straw -- Puree -- Mechanical Soft -- Regular -- Multi-consistency -- Pill -- Cervical Esophageal Comment -- [name] 10/17/2019, 2:52 PM Orbie Pyo Litaker M.Ed Actor Pager [phone] Office [phone]                   Scheduled Meds: . sodium chloride   Intravenous Once  . sodium chloride   Intravenous Once  . calcitRIOL  2 mcg Oral Daily  . diclofenac Sodium  2 g Topical QID  . lactulose  10 g Oral TID  . levothyroxine  400 mcg Oral Q0600  . loratadine  10 mg Oral [id number]  . mouth rinse  15 mL Mouth Rinse BID  . mirabegron ER  50 mg Oral Daily  . mometasone-formoterol  2 puff Inhalation BID  . montelukast  10 mg Oral QHS  . mupirocin ointment  1 application Nasal BID  . pantoprazole  40 mg Oral BID  . polyethylene glycol  17 g Oral Daily  . senna-docusate  1 tablet Oral Daily  . Vitamin D (Ergocalciferol)  50,000 Units Oral Once per day on Mon Wed Fri   Continuous Infusions: . sodium chloride    . sodium chloride 75 mL/hr at 10/19/19 0400  . aztreonam 1 g (10/19/19 1030)     LOS: 5 days  Time spent= 35 mins    Erie Radu Arsenio Loader, MD [facility]  If 7PM-7AM, please contact night-coverage  10/19/2019, 11:35 AM

## 2019-10-19 NOTE — Plan of Care (Signed)

## 2019-10-19 NOTE — NC FL2 (Signed)
[place] LEVEL OF CARE SCREENING TOOL     IDENTIFICATION  Patient Name: [name] Birthdate: 1956/03/10 Sex: female Admission Date (Current Location): 10/14/2019  [facility] and [place] Number:  Herbalist and Address:  [facility], [place] [address] [place], [place] [address]      Provider Number: [id number]  Attending Physician Name and Address:  Damita Lack, MD  Relative Name and Phone Number:  Anjeanette Scheck Brother [phone]    Current Level of Care: Hospital Recommended Level of Care: [place] Prior Approval Number: [id number]  Date Approved/Denied: 12/11/16 PASRR Number:    Discharge Plan: SNF    Current Diagnoses: Patient Active Problem List   Diagnosis Date Noted  . Knee pain   . Hypothermia 10/15/2019  . Thrombocytopenia ([place]) 10/15/2019  . AKI (acute kidney injury) ([place]) 10/15/2019  . Mild neurocognitive disorder 06/28/2019  . Major depressive disorder   . Generalized anxiety disorder   . Chronic anticoagulation 12/16/2018  . Abnormal nuclear stress test 12/16/2018  . Debilitated patient 12/16/2018  . Pressure injury of skin 12/13/2018  . UTI (urinary tract infection) 12/13/2018  . Pyelonephritis 11/21/2018  . Sepsis due to Escherichia coli (E. coli) ([place]) 11/28/2017  . E coli bacteremia 11/28/2017  . Sepsis ([place]) 11/24/2017  . Hypokalemia 12/10/2016  . Hypocalcemia 12/10/2016  . Recurrent falls 12/09/2016  . Right ureteral stone 07/17/2014  . Osteomyelitis ([place]) 06/14/2014  . Pernicious anemia 02/20/2014  . Reflux esophagitis 02/02/2014  . Edema 11/23/2013  . Foot pain 03/31/2013  . Endometrial adenocarcinoma ([place]) 02/21/2013  . Trochanteric bursitis of left hip 09/13/2012  . Muscle cramps 08/12/2012  . Fatty liver 01/07/2012  . Mid back pain 09/30/2011  . Weight gain 07/02/2011  . Nonspecific abnormal unspecified cardiovascular function study 04/30/2011  . Hx of  papillary thyroid carcinoma 04/07/2011  . Low back pain 04/01/2011  . PAF (paroxysmal atrial fibrillation) 02/05/2011  . Hypoparathyroidism ([place]) 01/07/2011  . GERD (gastroesophageal reflux disease) 05/22/2009  . Leukocytosis 03/24/2009  . Morbid obesity ([place]) 12/20/2008  . Rhinosinusitis, recurrent 09/07/2008  . Vitamin D deficiency 05/10/2007  . Iron deficiency anemia secondary to malabsorption  05/10/2007  . Anemia 05/10/2007  . Hypothyroidism 01/05/2007  . Essential hypertension 01/05/2007  . Asthma 01/05/2007    Orientation RESPIRATION BLADDER Height & Weight     Self, Time, Situation, Place(periods of poor judgement and confusion)  Normal Incontinent Weight: 111 kg Height:  5\' 4"  (162.6 cm)  BEHAVIORAL SYMPTOMS/MOOD NEUROLOGICAL BOWEL NUTRITION STATUS  (none) (none) Incontinent Diet(Dysphagia 3 with honey thick liquids)  AMBULATORY STATUS COMMUNICATION OF NEEDS Skin   Total Care(Edge of bed and pull up on walker) Verbally Other (Comment)(stage II documented with foam dressing skin intact erythema blanchable and maceration/ MASD noted under panus)                       Personal Care Assistance Level of Assistance  Bathing, Feeding, Dressing Bathing Assistance: Maximum assistance Feeding assistance: Limited assistance Dressing Assistance: Maximum assistance     Functional Limitations Info  Sight, Hearing, Speech Sight Info: Adequate Hearing Info: Adequate Speech Info: Adequate    SPECIAL CARE FACTORS FREQUENCY  PT (By licensed PT), OT (By licensed OT)     PT Frequency: 5X OT Frequency: 5X            Contractures Contractures Info: Not present    Additional Factors Info  Code Status, Allergies, Psychotropic,  Insulin Sliding Scale, Isolation Precautions, Suctioning Needs Code Status Info: Full Allergies Info: Nutritional Supplements, Other, Amoxicillin-pot Clavulanate, Ciprofloxacin, Food, Keflex, Penicillins, Rocephin , Tomato Psychotropic Info: Hx of  anxiety, depression not currently on any medications for this Insulin Sliding Scale Info: none Isolation Precautions Info: contact MRSA Suctioning Needs: none   Current Medications (10/19/2019):  This is the current hospital active medication list Current Facility-Administered Medications  Medication Dose Route Frequency Provider Last Rate Last Admin  . 0.9 %  sodium chloride infusion (Manually program via Guardrails IV Fluids)   Intravenous Once Jennye Boroughs, MD      . 0.9 %  sodium chloride infusion (Manually program via Guardrails IV Fluids)   Intravenous Once Jennye Boroughs, MD      . 0.9 %  sodium chloride infusion   Intravenous Continuous Amin, Ankit Chirag, MD      . 0.9 %  sodium chloride infusion   Intravenous Continuous Amin, Jeanella Flattery, MD 75 mL/hr at 10/19/19 0400 New Bag at 10/19/19 0400  . acetaminophen (TYLENOL) tablet 650 mg  650 mg Oral Q6H PRN Shela Leff, MD   650 mg at 10/17/19 0854   Or  . acetaminophen (TYLENOL) suppository 650 mg  650 mg Rectal Q6H PRN Shela Leff, MD      . albuterol (PROVENTIL) (2.5 MG/3ML) 0.083% nebulizer solution 2.5 mg  2.5 mg Nebulization Q6H PRN Shela Leff, MD      . aztreonam (AZACTAM) 1 g in sodium chloride 0.9 % 100 mL IVPB  1 g Intravenous Q12H Jennye Boroughs, MD 200 mL/hr at 10/18/19 2139 1 g at 10/18/19 2139  . calcitRIOL (ROCALTROL) capsule 2 mcg  2 mcg Oral Daily Shela Leff, MD   2 mcg at 10/18/19 0915  . diclofenac Sodium (VOLTAREN) 1 % topical gel 2 g  2 g Topical QID Shela Leff, MD   2 g at 10/19/19 0917  . lactulose (CHRONULAC) 10 GM/15ML solution 10 g  10 g Oral TID Damita Lack, MD   10 g at 10/19/19 0915  . levothyroxine (SYNTHROID) tablet 400 mcg  400 mcg Oral [id number] Shela Leff, MD   400 mcg at 10/19/19 0537  . loratadine (CLARITIN) tablet 10 mg  10 mg Oral Suzette Battiest, MD   10 mg at 10/19/19 0915  . MEDLINE mouth rinse  15 mL Mouth Rinse BID Patrecia Pour, MD   15  mL at 10/19/19 0932  . mirabegron ER (MYRBETRIQ) tablet 50 mg  50 mg Oral Daily Shela Leff, MD   50 mg at 10/18/19 0915  . mometasone-formoterol (DULERA) 200-5 MCG/ACT inhaler 2 puff  2 puff Inhalation BID Shela Leff, MD   2 puff at 10/19/19 [id number]  . montelukast (SINGULAIR) tablet 10 mg  10 mg Oral QHS Skeet Simmer, RPH   10 mg at 10/18/19 2136  . mupirocin ointment (BACTROBAN) 2 % 1 application  1 application Nasal BID Damita Lack, MD   1 application at [id number]  . pantoprazole (PROTONIX) EC tablet 40 mg  40 mg Oral BID Shela Leff, MD   40 mg at 10/18/19 2136  . polyethylene glycol (MIRALAX / GLYCOLAX) packet 17 g  17 g Oral Daily Shela Leff, MD   17 g at 10/19/19 0913  . Resource ThickenUp Clear   Oral PRN Patrecia Pour, MD   Given at 10/18/19 1701  . senna-docusate (Senokot-S) tablet 1 tablet  1 tablet Oral Daily Patrecia Pour, MD   1 tablet at  10/19/19 0914  . traMADol (ULTRAM) tablet 50 mg  50 mg Oral Q6H PRN Shela Leff, MD   50 mg at 10/18/19 0916  . Vitamin D (Ergocalciferol) (DRISDOL) capsule 50,000 Units  50,000 Units Oral Once per day on Mon Wed Fri Rathore, Vasundhra, MD   50,000 Units at 10/17/19 1034     Discharge Medications: Please see discharge summary for a list of discharge medications.  Relevant Imaging Results:  Relevant Lab Results:   Additional Information SSN [id number]  Angelita Ingles, RN

## 2019-10-19 NOTE — Progress Notes (Signed)
  Speech Language Pathology Treatment: Dysphagia  Patient Details Name: [name] MRN: [id number] DOB: 1955/07/10 Today's Date: 10/19/2019 Time: [id number] SLP Time Calculation (min) (ACUTE ONLY): 21 min  Assessment / Plan / Recommendation Clinical Impression  RN and pt's sister reported difficulty swallowing crushed meds and capsule whole in puree marked by gagging. Sister suspected ratio of meds to applesauce may have been excessively thick texture. Pt's cognition and awareness/alertness has improved therefore recommend pills whole in puree or if capsule is difficulty in puree, pt can take with honey thick liquids. Mastication was prolonged with chopped sausage. She consumed honey thick via straw without noted s/s aspiration. It is known that pt has GERD and has complained of globus sensation with meat and breads. Sister updated therapist (outside pt's room) of previous intermittent difficulty with liquids (cough, gag). She does not have history of pneumonia in past year per reviewed CXR's. Pt is sedentary at home. Exercises were not performed this session however sister reports practicing with pt and encouraged to continue. ST will continue intervention. Pt will need repeat MBS to upgrade liquids when appropriate.     HPI HPI: [name] is a 64 y.o. female with medical history significant of paroxysmal A. fib, chronic diastolic CHF, COPD, history of thyroidectomy on Synthroid, GERD, hypertension, asthma, anxiety, depression, morbid obesity (BMI 41.19) presenting to the ED via EMS for evaluation of generalized weakness and altered mental status. She has not been eating much for the past few days.  Patient has also complained of choking to family when eating dry items such as toast and observed coughing/gagging wtih po's. MRI was negative for acute abnormality and CXR was unremarkable.       SLP Plan  Continue with current plan of care       Recommendations  Diet recommendations:  Dysphagia 3 (mechanical soft);Honey-thick liquid Liquids provided via: Straw Medication Administration: Whole meds with puree(of if difficulty w/ capsule, with honey thick ) Supervision: Patient able to self feed;Intermittent supervision to cue for compensatory strategies Compensations: Minimize environmental distractions;Slow rate;Small sips/bites Postural Changes and/or Swallow Maneuvers: Seated upright 90 degrees                Oral Care Recommendations: Oral care BID Follow up Recommendations: (TBD) SLP Visit Diagnosis: Dysphagia, oropharyngeal phase (R13.12) Plan: Continue with current plan of care             [name] 10/19/2019, 12:15 PM  [name].Ed Risk analyst [phone] Office [phone]

## 2019-10-19 NOTE — Progress Notes (Signed)
Physical Therapy Treatment Patient Details Name: [name] MRN: [id number] DOB: [birth year] Today's Date: 10/19/2019    History of Present Illness 64 y.o. female with medical history significant of paroxysmal A. fib, chronic diastolic CHF, COPD, history of thyroidectomy on Synthroid, GERD, hypertension, asthma, anxiety, depression, morbid obesity (BMI 41.19) presenting to the ED via EMS for evaluation of generalized weakness and altered mental status. Pt admitted for UTI.    PT Comments    Pt agreeable to transfer attempt this day, requiring max assist +1-2 for bed mobility, scoot transfer to drop arm recliner via many small scoots. Pt additionally presents with fair to poor sitting balance, PT working with pt to find midline and upright at EOB and in recliner. Pt required lift via maximove back to bed due to fatigue from transfer and fecal incontinence, PT assisting RN with bed mobility to accomplish pericare. PT spoke with both pt and pt's sister-in-law, who both are now agreeable to SNF level of care post-acutely. This is an appropriate recommendation, PT recommendation updated accordingly. Will continue to follow acutely.    Follow Up Recommendations  Supervision/Assistance - 24 hour;SNF     Equipment Recommendations  Other (comment)(hoyer lift)    Recommendations for Other Services       Precautions / Restrictions Precautions Precautions: Fall Precaution Comments: skin tear on sacrum Restrictions Weight Bearing Restrictions: No    Mobility  Bed Mobility Overal bed mobility: Needs Assistance Bed Mobility: Rolling;Supine to Sit;Sit to Supine Rolling: Mod assist;+2 for physical assistance;+2 for safety/equipment   Supine to sit: Max assist;HOB elevated     General bed mobility comments: Mod +2 for roll bilaterally multiple times for pericare, physical assist for trunk and LE translation with verbal cuing for hand placement on bedrails to complete. Max assist for supine  to sit with elevated HOB for trunk and LE management, scooting to EOB.  Transfers Overall transfer level: Needs assistance Equipment used: None;Ambulation equipment used Transfers: Lateral/Scoot Transfers          Lateral/Scoot Transfers: Max assist;+2 physical assistance;+2 safety/equipment General transfer comment: Max assist for lateral scooting to L for trunk lifting, LE blocking, and hip translation. Verbal cuing to utilize head-hips relationship to scoot (lean head towards R and forward to offweight L buttocks), multiple small scoots towards drop arm recliner with +2 to clear hips and place pt in recliner. Maximove for return to bed due to stool incontinence and difficulty with pericare in chair, unable to scoot back to bed due to pt fatigue.  Ambulation/Gait             General Gait Details: unable   Stairs             Wheelchair Mobility    Modified Rankin (Stroke Patients Only)       Balance Overall balance assessment: Needs assistance Sitting-balance support: Feet supported;No upper extremity supported Sitting balance-Leahy Scale: Poor Sitting balance - Comments: posterior leaning with fatigue, requires verbal and light assist for upright. Responds well to "nose over toes" cue, sat EOB x5 mins and edge of recliner x5 minutes. Postural control: Posterior lean Standing balance support: Bilateral upper extremity supported;During functional activity Standing balance-Leahy Scale: Zero Standing balance comment: lateral scooting only                            Cognition Arousal/Alertness: Awake/alert Behavior During Therapy: WFL for tasks assessed/performed Overall Cognitive Status: Impaired/Different from baseline Area of Impairment: Orientation;Attention;Memory;Following commands;Safety/judgement;Awareness;Problem  solving                 Orientation Level: Disoriented to;Situation Current Attention Level: Sustained Memory: Decreased  recall of precautions;Decreased short-term memory Following Commands: Follows one step commands with increased time Safety/Judgement: Decreased awareness of safety;Decreased awareness of deficits Awareness: Intellectual Problem Solving: Slow processing;Decreased initiation;Difficulty sequencing;Requires verbal cues;Requires tactile cues General Comments: Pt laughs and jokes to mask confusion, as with OT yesterday. Requires short, simple verbal commands and tactile cuing to participate in mobility.      Exercises      General Comments General comments (skin integrity, edema, etc.): sacral skin tear noticed this session with small amount of bleeding - RN notified and placed foam dressing for skin protection      Pertinent Vitals/Pain Pain Assessment: Faces Faces Pain Scale: Hurts little more Pain Location: LEs, R hip, back during rolling Pain Descriptors / Indicators: Grimacing;Moaning;Discomfort;Crying Pain Intervention(s): Limited activity within patient's tolerance;Monitored during session;Repositioned    Home Living                      Prior Function            PT Goals (current goals can now be found in the care plan section) Acute Rehab PT Goals Patient Stated Goal: agreeable to rehab PT Goal Formulation: With patient/family Time For Goal Achievement: 10/29/19 Potential to Achieve Goals: Good Progress towards PT goals: Progressing toward goals    Frequency    Min 2X/week      PT Plan Discharge plan needs to be updated;Frequency needs to be updated    Co-evaluation              AM-PAC PT "6 Clicks" Mobility   Outcome Measure  Help needed turning from your back to your side while in a flat bed without using bedrails?: Total Help needed moving from lying on your back to sitting on the side of a flat bed without using bedrails?: Total Help needed moving to and from a bed to a chair (including a wheelchair)?: Total Help needed standing up from a  chair using your arms (e.g., wheelchair or bedside chair)?: Total Help needed to walk in hospital room?: Total Help needed climbing 3-5 steps with a railing? : Total 6 Click Score: 6    End of Session   Activity Tolerance: Patient limited by fatigue Patient left: in bed;with call bell/phone within reach;with bed alarm set;with nursing/sitter in room;with family/visitor present(with bed pan placed for BM) Nurse Communication: Mobility status PT Visit Diagnosis: Other abnormalities of gait and mobility (R26.89);Pain;Muscle weakness (generalized) (M62.81)     Time: [id number] PT Time Calculation (min) (ACUTE ONLY): 52 min  Charges:  $Therapeutic Activity: 23-37 mins $Neuromuscular Re-education: 8-22 mins                     Mikaele Stecher E, PT Acute Rehabilitation Services Pager [phone]  Office [phone]    Verita Kuroda D Levette Paulick 10/19/2019, 11:06 AM

## 2019-10-19 NOTE — TOC Progression Note (Addendum)
Transition of Care [facility]) - Progression Note    Patient Details  Name: [name] MRN: [id number] Date of Birth: 1956/04/27  Transition of Care [facility]) CM/SW [place], RN  Phone Number: [phone]  10/19/2019, 3:07 PM  Clinical Narrative: Phoebe Perch  & PASRR authorization completed. Patient info faxed out via the [employer]. Insurance authorization initiated. Family has given CM a list of preferences for placement. Will follow up with MD for discharge readiness.  10/19/19 1522 CM consulted to ensure that patient will be able to receive Copper elemental 2mg  po daily at facility. CM has contacted [facility] to determine if this med is available. Will await return call.         10/20/2019 [facility] will have bed available on 10/21/19 and it has been confirmed with Olivia Mackie the admissions coordinator that the facility will be able to provide copper elemental 2mg  po daily. CM unable to initiate insurance auth, this will need to be done by facility. Olivia Mackie has been made aware. MD made aware of need for new covid test. Brother [name]) has been updated. Plans for discharge on 10/21/2019. Discharge packet placed in chart.   10/20/2019 1640 Brother [name]) called CM and stated that he called [facility] and was told that no visitation was allowed due to active covid case at facility. Brother states that this is not going to work because his sister would not do good if she were not able to visit with him. CM explained to brother that discharge was set up based on his preference and the availability of Copper script. Brother questions if the patient would be able to go to a different facility. CM made brother aware that the CM would need to follow up and check availability of beds and script for copper. Brother also made aware that visitation may be restricted at other facilities as well. Brother states that he will call back in the morning.    Expected Discharge Plan: Skilled Nursing Facility Barriers to  Discharge: Insurance Authorization  Expected Discharge Plan and Services Expected Discharge Plan: [place] In-house Referral: NA Discharge Planning Services: CM Consult, Medication Assistance(Determine if recieving facility can supply copper) Post Acute Care Choice: [place] Living arrangements for the past 2 months: Single Family Home                 DME Arranged: N/A DME Agency: NA       HH Arranged: NA HH Agency: NA         Social Determinants of Health (SDOH) Interventions    Readmission Risk Interventions Readmission Risk Prevention Plan 12/13/2018  Transportation Screening Complete  PCP or Specialist Appt within 3-5 Days Complete  HRI or [place] Complete  Social Work Consult for [place] Planning/Counseling Complete  Palliative Care Screening Not Applicable  Medication Review Press photographer) Referral to Pharmacy  Some recent data might be hidden

## 2019-10-19 NOTE — TOC Initial Note (Addendum)
Transition of Care [facility]) - Initial/Assessment Note    Patient Details  Name: [name] MRN: [id number] Date of Birth: 11-04-1955  Transition of Care [facility]) CM/SW Contact:    Angelita Ingles, RN Phone Number: [phone]  10/19/2019, 3:04 PM  Clinical Narrative:   CM at bedside per brother request to answer questions about transfer to a facility . Brother states patient is from home with him and his wife and he does not this that she can go back home right now bed cause she is confused and weak. Brother gives CM verbal consent to initiate bed search for patient. CM to follow up with brother for choices                 Expected Discharge Plan: [place] Barriers to Discharge: Insurance Authorization   Patient Goals and CMS Choice   CMS Medicare.gov Compare Post Acute Care list provided to:: Patient Represenative (must comment)(Brother [name])    Expected Discharge Plan and Services Expected Discharge Plan: [place] In-house Referral: NA Discharge Planning Services: CM Consult, Medication Assistance(Determine if recieving facility can supply copper) Post Acute Care Choice: [place] arrangements for the past 2 months: Single Family Home                 DME Arranged: N/A DME Agency: NA       HH Arranged: NA HH Agency: NA        Prior Living Arrangements/Services Living arrangements for the past 2 months: Single Family Home Lives with:: Siblings Patient language and need for interpreter reviewed:: Yes Do you feel safe going back to the place where you live?: Yes      Need for Family Participation in Patient Care: Yes (Comment) Care giver support system in place?: Yes (comment)   Criminal Activity/Legal Involvement Pertinent to Current Situation/Hospitalization: No - Comment as needed  Activities of Daily Living Home Assistive Devices/Equipment: [facility] Hospital bed, Grab bars around toilet, Eyeglasses,  Grab bars in shower, Built-in shower seat, Environmental consultant (specify type), Other (Comment)(rolling walker) ADL Screening (condition at time of admission) Patient's cognitive ability adequate to safely complete daily activities?: Yes Is the patient deaf or have difficulty hearing?: No Does the patient have difficulty seeing, even when wearing glasses/contacts?: No Does the patient have difficulty concentrating, remembering, or making decisions?: Yes(recent confusion) Patient able to express need for assistance with ADLs?: Yes Does the patient have difficulty dressing or bathing?: Yes Independently performs ADLs?: No Communication: Independent Toileting: Dependent Is this a change from baseline?: Change from baseline, expected to last <3 days In/Out Bed: Dependent Walks in Home: Dependent Is this a change from baseline?: Change from baseline, expected to last <3 days Does the patient have difficulty walking or climbing stairs?: Yes Weakness of Legs: Both Weakness of Arms/Hands: None  Permission Sought/Granted   Permission granted to share information with : Yes, Verbal Permission Granted  Share Information with NAME: Annalou Lamberson     Permission granted to share info w Relationship: brother     Emotional Assessment Appearance:: Appears stated age Attitude/Demeanor/Rapport: Gracious Affect (typically observed): Pleasant Orientation: : Oriented to Self, Oriented to Place, Oriented to Situation, Oriented to  Time(orientation fluctuates sometines) Alcohol / Substance Use: Not Applicable Psych Involvement: No (comment)  Admission diagnosis:  UTI (urinary tract infection) [N39.0] Knee pain [M25.569] Fall [W19.XXXA] Acute urinary tract infection [N39.0] Acute kidney injury ([place]) [N17.9] Hypothermia, initial encounter [T68.XXXA] Anemia, unspecified type [D64.9] Patient Active Problem List  Diagnosis Date Noted  . Knee pain   . Hypothermia 10/15/2019  . Thrombocytopenia ([place]) 10/15/2019  .  AKI (acute kidney injury) ([place]) 10/15/2019  . Mild neurocognitive disorder 06/28/2019  . Major depressive disorder   . Generalized anxiety disorder   . Chronic anticoagulation 12/16/2018  . Abnormal nuclear stress test 12/16/2018  . Debilitated patient 12/16/2018  . Pressure injury of skin 12/13/2018  . UTI (urinary tract infection) 12/13/2018  . Pyelonephritis 11/21/2018  . Sepsis due to Escherichia coli (E. coli) ([place]) 11/28/2017  . E coli bacteremia 11/28/2017  . Sepsis ([place]) 11/24/2017  . Hypokalemia 12/10/2016  . Hypocalcemia 12/10/2016  . Recurrent falls 12/09/2016  . Right ureteral stone 07/17/2014  . Osteomyelitis ([place]) 06/14/2014  . Pernicious anemia 02/20/2014  . Reflux esophagitis 02/02/2014  . Edema 11/23/2013  . Foot pain 03/31/2013  . Endometrial adenocarcinoma ([place]) 02/21/2013  . Trochanteric bursitis of left hip 09/13/2012  . Muscle cramps 08/12/2012  . Fatty liver 01/07/2012  . Mid back pain 09/30/2011  . Weight gain 07/02/2011  . Nonspecific abnormal unspecified cardiovascular function study 04/30/2011  . Hx of papillary thyroid carcinoma 04/07/2011  . Low back pain 04/01/2011  . PAF (paroxysmal atrial fibrillation) 02/05/2011  . Hypoparathyroidism ([place]) 01/07/2011  . GERD (gastroesophageal reflux disease) 05/22/2009  . Leukocytosis 03/24/2009  . Morbid obesity ([place]) 12/20/2008  . Rhinosinusitis, recurrent 09/07/2008  . Vitamin D deficiency 05/10/2007  . Iron deficiency anemia secondary to malabsorption  05/10/2007  . Anemia 05/10/2007  . Hypothyroidism 01/05/2007  . Essential hypertension 01/05/2007  . Asthma 01/05/2007   PCP:  Debbrah Alar, NP Pharmacy:   [facility], [address] [place] [address] Phone: [phone] Fax: [phone]  [id number] [address] [place] [facility] [address] [place] [address] Phone: [phone] Fax:  [phone]     Social Determinants of Health (SDOH) Interventions    Readmission Risk Interventions Readmission Risk Prevention Plan 12/13/2018  Transportation Screening Complete  PCP or Specialist Appt within 3-5 Days Complete  HRI or [place] Complete  Social Work Consult for [place] Planning/Counseling Complete  Palliative Care Screening Not Applicable  Medication Review (RN Care Manager) Referral to Pharmacy  Some recent data might be hidden

## 2019-10-20 LAB — CBC
HCT: 28.5 % — ABNORMAL LOW (ref 36.0–46.0)
Hemoglobin: 8.1 g/dL — ABNORMAL LOW (ref 12.0–15.0)
MCH: 27.4 pg (ref 26.0–34.0)
MCHC: 28.4 g/dL — ABNORMAL LOW (ref 30.0–36.0)
MCV: 96.3 fL (ref 80.0–100.0)
Platelets: 64 10*3/uL — ABNORMAL LOW (ref 150–400)
RBC: 2.96 MIL/uL — ABNORMAL LOW (ref 3.87–5.11)
RDW: 18.2 % — ABNORMAL HIGH (ref 11.5–15.5)
WBC: 5.9 10*3/uL (ref 4.0–10.5)
nRBC: 0 % (ref 0.0–0.2)

## 2019-10-20 LAB — BASIC METABOLIC PANEL
Anion gap: 8 (ref 5–15)
BUN: 25 mg/dL — ABNORMAL HIGH (ref 8–23)
CO2: 21 mmol/L — ABNORMAL LOW (ref 22–32)
Calcium: 9.1 mg/dL (ref 8.9–10.3)
Chloride: 116 mmol/L — ABNORMAL HIGH (ref 98–111)
Creatinine, Ser: 1.63 mg/dL — ABNORMAL HIGH (ref 0.44–1.00)
GFR calc Af Amer: 39 mL/min — ABNORMAL LOW (ref >60)
GFR calc non Af Amer: 33 mL/min — ABNORMAL LOW (ref >60)
Glucose, Bld: 75 mg/dL (ref 70–99)
Potassium: 3.8 mmol/L (ref 3.5–5.1)
Sodium: 145 mmol/L (ref 135–145)

## 2019-10-20 LAB — SARS CORONAVIRUS 2 (TAT 6-24 HRS): SARS Coronavirus 2: NEGATIVE

## 2019-10-20 LAB — GLUCOSE, CAPILLARY
Glucose-Capillary: 68 mg/dL — ABNORMAL LOW (ref 70–99)
Glucose-Capillary: 73 mg/dL (ref 70–99)
Glucose-Capillary: 69 mg/dL — ABNORMAL LOW (ref 70–99)
Glucose-Capillary: 66 mg/dL — ABNORMAL LOW (ref 70–99)

## 2019-10-20 LAB — MAGNESIUM: Magnesium: 1.8 mg/dL (ref 1.7–2.4)

## 2019-10-20 MED ORDER — LACTULOSE 10 GM/15ML PO SOLN: 20.0000 g | Freq: Three times a day (TID) | ORAL | 0 refills | Status: DC

## 2019-10-20 MED ORDER — SULFAMETHOXAZOLE-TRIMETHOPRIM 800-160 MG PO TABS: 1.0000 | ORAL_TABLET | Freq: Two times a day (BID) | ORAL | 0 refills | Status: AC

## 2019-10-20 MED ORDER — TRAMADOL HCL 50 MG PO TABS: 50.0000 mg | ORAL_TABLET | Freq: Two times a day (BID) | ORAL | 0 refills | Status: DC | PRN

## 2019-10-20 MED ORDER — SODIUM CHLORIDE 0.9 % IV BOLUS
250.0000 mL | Freq: Once | INTRAVENOUS | Status: AC
  Administered 2019-10-20: 250 mL via INTRAVENOUS

## 2019-10-20 MED ORDER — COPPER CAPS 2 MG PO CAPS: 1.0000 | ORAL_CAPSULE | Freq: Every day | ORAL | Status: DC

## 2019-10-20 MED ORDER — LACTULOSE 10 GM/15ML PO SOLN
20.0000 g | Freq: Three times a day (TID) | ORAL | Status: DC
  Administered 2019-10-20 – 2019-10-22 (×6): 20 g via ORAL
  Filled 2019-10-20 (×6): qty 30

## 2019-10-20 MED ORDER — SODIUM CHLORIDE 0.9 % IV SOLN: INTRAVENOUS | Status: DC

## 2019-10-20 MED ORDER — SENNOSIDES-DOCUSATE SODIUM 8.6-50 MG PO TABS: 1.0000 | ORAL_TABLET | Freq: Every evening | ORAL | Status: DC | PRN

## 2019-10-20 NOTE — Discharge Instructions (Signed)
You were cared for by a hospitalist during your hospital stay. If you have any questions about your discharge medications or the care you received while you were in the hospital after you are discharged, you can call the unit and asked to speak with the hospitalist on call if the hospitalist that took care of you is not available. Once you are discharged, your primary care physician will handle any further medical issues. Please note that NO REFILLS for any discharge medications will be authorized once you are discharged, as it is imperative that you return to your primary care physician (or establish a relationship with a primary care physician if you do not have one) for your aftercare needs so that they can reassess your need for medications and monitor your lab values.  Please request your Prim.MD to go over all Hospital Tests and Procedure/Radiological results at the follow up, please get all Hospital records sent to your Prim MD by signing hospital release before you go home.  Get CBC, CMP, 2 view Chest X ray checked  by Primary MD during your next visit or SNF MD in 5-7 days ( we routinely change or add medications that can affect your baseline labs and fluid status, therefore we recommend that you get the mentioned basic workup next visit with your PCP, your PCP may decide not to get them or add new tests based on their clinical decision)  On your next visit with your primary care physician please Get Medicines reviewed and adjusted.  If you experience worsening of your admission symptoms, develop shortness of breath, life threatening emergency, suicidal or homicidal thoughts you must seek medical attention immediately by calling 911 or calling your MD immediately  if symptoms less severe.  You Must read complete instructions/literature along with all the possible adverse reactions/side effects for all the Medicines you take and that have been prescribed to you. Take any new Medicines after you  have completely understood and accpet all the possible adverse reactions/side effects.   Do not drive, operate heavy machinery, perform activities at heights, swimming or participation in water activities or provide baby sitting services if your were admitted for syncope or siezures until you have seen by Primary MD or a Neurologist and advised to do so again.  Do not drive when taking Pain medications.  

## 2019-10-20 NOTE — Discharge Summary (Addendum)
Physician Discharge Summary  [name] [id number] DOB: [birth year] DOA: 10/14/2019  PCP: Debbrah Alar, NP  Admit date: 10/14/2019 Discharge date: 10/22/19  Admitted From: Home  Disposition:  SNF  Recommendations for Outpatient Follow-up:  1. Follow up with PCP in 1-2 weeks 2. Please obtain BMP/CBC in 4 days your next doctors visit.  3. Hold Eliquis if Plts <50k 4. Lactulose TID, Titrate to 3 soft bowel movements daily 5. Bactrim PO BID x 2 days  6. PO Copper 2mg  daily  7. Follow up Outpatient with Hematology in 2-4 weeks  8. Follow up with Dr Ninfa Linden from Orthopedic in 2-3 weeks for the right knee.    Discharge Condition: Stable CODE STATUS: Full  Diet recommendation: Heart Healthy, dysphagia 3 diet  Brief/Interim Summary: 64 y.o.femalewith a history of PAF on eliquis, gastric bypass with iron deficiency anemia, hypothyroidism, chronic HFpEF, HTN, COPD/asthma, anxiety, depression and morbid obesity who presented to the ED with increasing confusion and generalized weakness as well as right knee pain after falling on it during a transfer at home. She also complained of dysuria but was confused on arrival. She was hypothermic, hypotensive with hgb 7.8, down from baseline, normocytic, and platelets 43k down from normal previously, creatinine 2 up from 1.4, UA with hematuria, pyuria, bacteriuria, CXR no active disease. CT head without acute abnormality. CT renal stone study showed nonobstructing nephrolithiasis with mild prominence of the left renal pelvis with perinephric edema.CT also showing large volume stool throughout the colon concerning for constipation. No bowel obstruction or inflammation. Liver with nodular hepatic contour, can be seen with cirrhosis. Mild splenomegaly, unchanged.She was given cefepime, 2L LR, tramadol and voltaren gel. Platelets have continued downward trend and hematology is consulted.  With improvement in sepsis platelets started recovering and  was 64,000 on the day of discharge therefore Eliquis was resumed per hematology recommendations.  Was noted to have copper deficiency therefore started on copper supplements.  Patient had intermittent episodes of confusion in the hospital which is secondary to ongoing sepsis and delirium but overall remained very calm and was easily directable.  Attempted to call his brother several times to update him but did not answer his phone.   Sepsis secondary to UTI:= E. Coli Acute metabolic/hepatic encephalopathy -Appears to be improving.  E. coli is pansensitive patient has allergy to multiple medications, will prescribe 2 more days of oral Bactrim. Extensive allergy list. -CT head negative. -Ammonia was mildly elevated, lactulose 3 times daily-titrate to 3 soft bowel movements daily  Thrombocytopenia, improving  anemia of chronic disease -Platelets keeps clumping, spoke with Dr. Dorsey-recommended CBC collection in citrate tubing.  No further intervention as long as platelets greater than 20,000. -No obvious evidence of bleeding at the moment. -Hold Eliquis while platelets less than 50.  Today 64,000 therefore will resume Eliquis.  Follow-up outpatient hematology. -Methylmalonic acid, homocysteine levels-normal -Status post PRBC transfusion 5/23. -Repeat lab work as mentioned above  Copper deficiency -Supplements ordered-copper supplements will also be confirmed by case manager to make sure patient gets that the rehab facility.  Elevated d-dimer, leg edema: No evidence of lower extremity DVT on venous duplex.  Acute kidney injury -Pretty much resolved.  This morning  Is 1.64.  Continue Lasix and Aldactone.  Repeat lab work as mentioned above in transition as necessary.  Right knee pain secondary to large joint effusion -No evidence of fracture seen on x-ray.  Seen by orthopedic, Dr. Ninfa Linden were attempted aspiration but had difficulty.  Patient does have good flexion  and extension  therefore recommend discharging the patient with outpatient follow-up in his clinic.  Appreciate his input.  Generalized weakness:  - PT/OT  Dysphagia:  - SLP evaluation  Chronic diastolic CHF: Compensated. BNP wnl, no pulmonary edema, appears volume down.   PAF:  -Resuming Eliquis  Hypothyroidism: TSH normal. -Continue homesynthroid421mcg  Constipation:CT also showing large volume stool throughout the colon concerning for constipation. No bowel obstruction or inflammation.  Laxatives as needed.  COPD/asthma: No exacerbation.  - continue BD.   PT/OT= SNF Speech = Dys 3 diet.      Discharge Diagnoses:  Principal Problem:   UTI (urinary tract infection) Active Problems:   Anemia   Sepsis ([place])   Hypothermia   Thrombocytopenia ([facility])   AKI (acute kidney injury) ([place])   Knee pain    Consultations:  Hematology  Subjective: Patient is awake alert and oriented X3 this morning during my evaluation. Any complaints.  Overnight did have some episode of confusion  Discharge Exam: Vitals:   10/20/19 0438 10/20/19 0724  BP: (!) 110/50 124/63  Pulse: (!) 51 (!) 55  Resp: (!) 6 12  Temp: 97.7 F (36.5 C) (!) 97.5 F (36.4 C)  SpO2: 99% 96%   Vitals:   10/19/19 1536 10/20/19 0027 10/20/19 0438 10/20/19 0724  BP: (!) 104/53 (!) 114/50 (!) 110/50 124/63  Pulse: (!) 55 60 (!) 51 (!) 55  Resp: (!) 8  (!) 6 12  Temp: (!) 97.5 F (36.4 C)  97.7 F (36.5 C) (!) 97.5 F (36.4 C)  TempSrc: Oral  Oral Oral  SpO2: 98% 97% 99% 96%  Weight:      Height:        General: Pt is alert, awake, not in acute distress Cardiovascular: RRR, S1/S2 +, no rubs, no gallops Respiratory: CTA bilaterally, no wheezing, no rhonchi Abdominal: Soft, NT, ND, bowel sounds + Extremities: no edema, no cyanosis  Discharge Instructions   Allergies as of 10/20/2019      Reactions   Nutritional Supplements Anaphylaxis   Other Anaphylaxis   Reaction to tree nuts    Amoxicillin-pot Clavulanate Other (See Comments)   headache   Ciprofloxacin Nausea Only, Rash   Food Hives, Other (See Comments)   Potato   Keflex [cephalexin] Rash   Penicillins Rash   Headache. And hives - Dose not tolerate Cephalosporin either   Has patient had a PCN reaction causing immediate rash, facial/tongue/throat swelling, SOB or lightheadedness with hypotension: No Has patient had a PCN reaction causing severe rash involving mucus membranes or skin necrosis: No Has patient had a PCN reaction that required hospitalization: No Has patient had a PCN reaction occurring within the last 10 years: Yes (reaction was May 2020) If all of the above answers are "NO", then may proce   Rocephin [ceftriaxone] Rash   Tomato Hives      Medication List    STOP taking these medications   DULoxetine 30 MG capsule Commonly known as: Cymbalta   trimethoprim 100 MG tablet Commonly known as: TRIMPEX     TAKE these medications   acetaminophen 325 MG tablet Commonly known as: TYLENOL Take 2 tablets (650 mg total) by mouth every 6 (six) hours as needed for mild pain or moderate pain.   albuterol 108 (90 Base) MCG/ACT inhaler Commonly known as: VENTOLIN HFA Inhale 2 puffs into the lungs every 6 (six) hours as needed for wheezing or shortness of breath.   albuterol (2.5 MG/3ML) 0.083% nebulizer solution Commonly known as: PROVENTIL  Take 3 mLs (2.5 mg total) by nebulization every 6 (six) hours as needed for wheezing or shortness of breath.   apixaban 5 MG Tabs tablet Commonly known as: Eliquis Take 1 tablet (5 mg total) by mouth 2 (two) times daily.   calcitRIOL 0.5 MCG capsule Commonly known as: ROCALTROL Take 2 mcg by mouth daily.   Copper Caps 2 MG Caps Generic drug: Copper Gluconate Take 1 tablet by mouth daily.   CRANBERRY CONCENTRATE PO Take 30,000 mg by mouth daily.   cyanocobalamin 1000 MCG/ML injection Commonly known as: (VITAMIN B-12) ADMINISTER 1ML (1,000 MCG) UNDER  THE SKIN EVERY 30 DAYS What changed: See the new instructions.   desoximetasone 0.05 % cream Commonly known as: TOPICORT Apply topically 2 (two) times daily as needed. What changed:   how much to take  reasons to take this   diclofenac sodium 1 % Gel Commonly known as: VOLTAREN Apply 2 g topically 4 (four) times daily. What changed:   when to take this  reasons to take this   diltiazem 120 MG 24 hr capsule Commonly known as: CARDIZEM CD Take 1 capsule (120 mg total) by mouth daily.   EpiPen 2-Pak 0.3 mg/0.3 mL Soaj injection Generic drug: EPINEPHrine Inject 0.3 mg into the muscle once as needed for anaphylaxis (severe allergic reaction).   furosemide 40 MG tablet Commonly known as: LASIX Take 1 tablet (40 mg total) by mouth 2 (two) times daily. What changed: how much to take   Gas-X 80 MG chewable tablet Generic drug: simethicone Chew 1 tablet by mouth 4 (four) times daily as needed for flatulence.   hydrocortisone cream 1 % Apply topically 4 (four) times daily as needed for itching.   lactulose 10 GM/15ML solution Commonly known as: CHRONULAC Take 30 mLs (20 g total) by mouth 3 (three) times daily. Titrate to 3 soft bowel movements daily.   levocetirizine 5 MG tablet Commonly known as: XYZAL TAKE 1 TABLET BY MOUTH  EVERY EVENING What changed: when to take this   levothyroxine 100 MCG tablet Commonly known as: SYNTHROID Take 400 mcg by mouth daily before breakfast.   melatonin 5 MG Tabs Take 5 mg by mouth at bedtime.   Myrbetriq 50 MG Tb24 tablet Generic drug: mirabegron ER Take 50 mg by mouth daily.   nystatin powder Commonly known as: MYCOSTATIN/NYSTOP Apply topically 2 (two) times daily. What changed:   how much to take  when to take this  reasons to take this   nystatin cream Commonly known as: MYCOSTATIN Apply 1 application topically 2 (two) times daily. What changed: Another medication with the same name was changed. Make sure you  understand how and when to take each.   omeprazole 40 MG capsule Commonly known as: PRILOSEC Take 1 capsule (40 mg total) by mouth in the morning and at bedtime.   ondansetron 4 MG tablet Commonly known as: Zofran Take 1 tablet (4 mg total) by mouth every 8 (eight) hours as needed for nausea or vomiting.   senna-docusate 8.6-50 MG tablet Commonly known as: Senokot-S Take 1 tablet by mouth at bedtime as needed for mild constipation.   spironolactone 25 MG tablet Commonly known as: Aldactone Take 1 tablet (25 mg total) by mouth daily.   sulfamethoxazole-trimethoprim 800-160 MG tablet Commonly known as: BACTRIM DS Take 1 tablet by mouth 2 (two) times daily for 2 days.   Symbicort 160-4.5 MCG/ACT inhaler Generic drug: budesonide-formoterol INHALE 2 PUFFS BY MOUTH TWO TIMES DAILY What changed: See the  new instructions.   SYRINGE 3CC/20GX1" 20G X 1" 3 ML Misc Use monthly as directed What changed:   how much to take  how to take this  when to take this  additional instructions   traMADol 50 MG tablet Commonly known as: ULTRAM Take 1 tablet (50 mg total) by mouth every 12 (twelve) hours as needed for moderate pain. TAKE 1 TABLET(50 MG) BY MOUTH EVERY 12 HOURS AS NEEDED   Vitamin D (Ergocalciferol) 1.25 MG (50000 UNIT) Caps capsule Commonly known as: DRISDOL TAKE 1 CAPSULE BY MOUTH  EVERY MONDAY, WEDNESDAY,  AND FRIDAY. What changed:   how much to take  how to take this  when to take this   zafirlukast 20 MG tablet Commonly known as: ACCOLATE Take 1 tablet (20 mg total) by mouth 2 (two) times daily before a meal.      Follow-up Information    Debbrah Alar, NP. Schedule an appointment as soon as possible for a visit in 1 week(s).   Specialty: Internal Medicine Contact information: [place] [address] [phone]        Lelon Perla, MD .   Specialty: Cardiology Contact information: [address] [place] [address] [phone]          Allergies  Allergen Reactions  . Nutritional Supplements Anaphylaxis  . Other Anaphylaxis    Reaction to tree nuts  . Amoxicillin-Pot Clavulanate Other (See Comments)    headache  . Ciprofloxacin Nausea Only and Rash  . Food Hives and Other (See Comments)    Potato  . Keflex [Cephalexin] Rash  . Penicillins Rash    Headache. And hives - Dose not tolerate Cephalosporin either   Has patient had a PCN reaction causing immediate rash, facial/tongue/throat swelling, SOB or lightheadedness with hypotension: No Has patient had a PCN reaction causing severe rash involving mucus membranes or skin necrosis: No Has patient had a PCN reaction that required hospitalization: No Has patient had a PCN reaction occurring within the last 10 years: Yes (reaction was May 2020) If all of the above answers are "NO", then may proce  . Rocephin [Ceftriaxone] Rash  . Tomato Hives    You were cared for by a hospitalist during your hospital stay. If you have any questions about your discharge medications or the care you received while you were in the hospital after you are discharged, you can call the unit and asked to speak with the hospitalist on call if the hospitalist that took care of you is not available. Once you are discharged, your primary care physician will handle any further medical issues. Please note that no refills for any discharge medications will be authorized once you are discharged, as it is imperative that you return to your primary care physician (or establish a relationship with a primary care physician if you do not have one) for your aftercare needs so that they can reassess your need for medications and monitor your lab values.   Procedures/Studies: DG Knee 1-2 Views Right  Result Date: 10/14/2019 CLINICAL DATA:  Knee pain EXAM: RIGHT KNEE - 1-2 VIEW COMPARISON:  06/30/2018 FINDINGS: There are advanced tricompartmental degenerative  changes. There is a large joint effusion, new from prior study. There is no definite acute displaced fracture or dislocation. IMPRESSION: 1. Large joint effusion without evidence for an acute displaced fracture or dislocation. If there is high clinical suspicion for an occult fracture, follow-up with cross-sectional imaging is recommended.  2. Advanced tricompartmental degenerative changes are noted. Electronically Signed   By: Constance Holster M.D.   On: 10/14/2019 23:07   CT Head Wo Contrast  Result Date: 10/14/2019 CLINICAL DATA:  Altered mental status.  Increasing weakness. EXAM: CT HEAD WITHOUT CONTRAST TECHNIQUE: Contiguous axial images were obtained from the base of the skull through the vertex without intravenous contrast. COMPARISON:  CT head 11/21/2018.  Cranial MRI 08/12/2019. FINDINGS: Brain: There is no evidence of acute intracranial hemorrhage, mass lesion, brain edema or extra-axial fluid collection. The ventricles and subarachnoid spaces are appropriately sized for age. There is no CT evidence of acute cortical infarction. Vascular: Mild intracranial vascular calcifications. No hyperdense vessel identified. Skull: Negative for fracture or focal lesion. Calvarial hyperostosis again noted. Sinuses/Orbits: The visualized paranasal sinuses and mastoid air cells are clear. No orbital abnormalities are seen. Other: None. IMPRESSION: Stable head CT. No acute intracranial findings. Electronically Signed   By: Richardean Sale M.D.   On: 10/14/2019 18:56   DG Chest Port 1 View  Result Date: 10/14/2019 CLINICAL DATA:  Weakness EXAM: PORTABLE CHEST 1 VIEW COMPARISON:  12/12/2018 FINDINGS: Cardiac shadow is stable. Mild aortic calcifications are seen. The lungs are clear bilaterally. No bony abnormality is seen. IMPRESSION: No acute abnormality noted. Electronically Signed   By: Inez Catalina M.D.   On: 10/14/2019 13:10   DG Swallowing Func-Speech Pathology  Result Date: 10/17/2019 Objective  Swallowing Evaluation: Type of Study: MBS-Modified Barium Swallow Study  Patient Details Name: PAYTEN GOLDSBOROUGH MRN: [id number] Date of Birth: 1956-03-31 Today's Date: 10/17/2019 Time: SLP Start Time (ACUTE ONLY): 1309 -SLP Stop Time (ACUTE ONLY): 1326 SLP Time Calculation (min) (ACUTE ONLY): 17 min Past Medical History: Past Medical History: Diagnosis Date . Abnormal Pap smear   years ago/no biopsy . Allergic rhinitis  . Aortic atherosclerosis  . Arthritis   back- lower . Asthma  . Atrial fibrillation 12/2010  OFF XARELTO LAST MONTH DUE TO BLEEDING IN STOOL . Bursitis of hip left . Chronic anticoagulation 12/16/2018  CHADS VASC=2 for sex and H/O HTN- she is on Eliquis . Chronic diastolic (congestive) heart failure   pt unaware of this . COPD (chronic obstructive pulmonary disease) with chronic bronchitis  . Decreased pulses in feet  . Dysrhythmia   afib, followed by Dr. Stanford Breed  . Edema of both legs  . Endometrial adenocarcinoma 02/21/2013  S/p D and C, has mirena, being followed by GYN  . Esophagitis 02/02/2014  Distal, linear erosions, noted on endoscopy . Essential hypertension 01/05/2007  Echo Nov June 2020- EF 50-55%, mild LVH, normal LA  . Fatty liver 01/07/2012 . Fibroid 1974  fibroid cyst on left fallopian tube . Fibromyalgia  . Foot ulcer   AREA HEALED RIGHT FOOT . Generalized anxiety disorder  . GERD (gastroesophageal reflux disease)  . Headache   occasional sinus headache  . Hepatomegaly  . History of blood transfusion JULY 2015 . History of cardiomegaly 12/06/2010  Noted on CT . History of E. coli septicemia  . History of papillary thyroid carcinoma 04/07/2011 . Hypocalcemia 12/10/2016 . Hypokalemia 12/10/2016 . Hypoparathyroidism 01/07/2011 . Hypothyroidism  . Iron deficiency anemia secondary to malabsorption  05/10/2007  Qualifier: Diagnosis of  By: Wynona Luna   . Kidney stone Sep 21, 2015  passed on their own . Leukocytosis 03/24/2009  cta cheQualifier: Diagnosis of  By: Wynona Luna   . Major  depressive disorder  . Mild neurocognitive disorder 06/28/2019 . Morbid obesity  . MRSA infection  .  Nephrolithiasis 2/16, 9/16  SEES DR Risa Grill . Osteomyelitis 06/14/2014 . PAF (paroxysmal atrial fibrillation) 02/05/2011  Documented 2012- NSR since.  On chronic anticoagulation. . Pernicious anemia 02/20/2014  followed by Debbrah Alar . Pneumonia  . PONV (postoperative nausea and vomiting)  . Pyelonephritis 11/24/2018 . Rash   thighs and back . Seizures   infancy secondary to fever . Staph aureus infection  . Thoracic spondylosis 12/06/2010  Noted on CT . Uterine cancer 2014  Mirena IUD . UTI (urinary tract infection) 12/13/2018 . Vitamin B 12 deficiency  . Vitamin D deficiency 05/10/2007  Qualifier: Diagnosis of  By: Wynona Luna  Past Surgical History: Past Surgical History: Procedure Laterality Date . AMPUTATION Right 05/18/2014  Procedure: RIGHT FIFTH RAY AMPUTATION FOOT;  Surgeon: Wylene Simmer, MD;  Location: [facility];  Service: Orthopedics;  Laterality: Right; . APPENDECTOMY   . BUNIONECTOMY    bilateral . COLONOSCOPY   . COLONOSCOPY WITH PROPOFOL N/A 02/02/2014  Procedure: COLONOSCOPY WITH PROPOFOL;  Surgeon: Irene Shipper, MD;  Location: [facility];  Service: Endoscopy;  Laterality: N/A; . cyst on ovary removed    . CYSTOSCOPY W/ RETROGRADES  03/03/2012  Procedure: CYSTOSCOPY WITH RETROGRADE PYELOGRAM;  Surgeon: Bernestine Amass, MD;  Location: [facility];  Service: Urology;  Laterality: Bilateral; . CYSTOSCOPY W/ URETERAL STENT PLACEMENT Right 11/25/2017  Procedure: CYSTOSCOPY WITH RETROGRADE RIGHT URETERAL STENT PLACEMENT;  Surgeon: Franchot Gallo, MD;  Location: [facility];  Service: Urology;  Laterality: Right; . CYSTOSCOPY W/ URETERAL STENT PLACEMENT Right 01/15/2018  Procedure: RIGHT STENT EXCHANGE;  Surgeon: Ardis Hughs, MD;  Location: [facility];  Service: Urology;  Laterality: Right; . CYSTOSCOPY WITH RETROGRADE PYELOGRAM, URETEROSCOPY AND STENT PLACEMENT Left 08/25/2012  Procedure: CYSTOSCOPY WITH  RETROGRADE PYELOGRAM, URETEROSCOPY;  Surgeon: Bernestine Amass, MD;  Location: [facility];  Service: Urology;  Laterality: Left; . CYSTOSCOPY WITH STENT PLACEMENT Left 01/15/2018  Procedure: LEFT STENT PLACEMENT;  Surgeon: Ardis Hughs, MD;  Location: [facility];  Service: Urology;  Laterality: Left; . CYSTOSCOPY WITH STENT PLACEMENT Right 01/22/2018  Procedure: RIGHT STENT REMOVAL;  Surgeon: Ardis Hughs, MD;  Location: [facility];  Service: Urology;  Laterality: Right; . CYSTOSCOPY/URETEROSCOPY/HOLMIUM LASER/STENT PLACEMENT Right 12/23/2017  Procedure: RIGHT URETEROSCOPY STONE REMOVAL, HOLMIUM LASER, RIGHT /STENT EXCHANGE;  Surgeon: Ardis Hughs, MD;  Location: [facility];  Service: Urology;  Laterality: Right; . CYSTOSCOPY/URETEROSCOPY/HOLMIUM LASER/STENT PLACEMENT Left 01/22/2018  Procedure: LEFT URETEROSCOPY STONE REMOVAL HOLMIUM LASER LEFT STENT Freddi Starr;  Surgeon: Ardis Hughs, MD;  Location: [facility];  Service: Urology;  Laterality: Left; . DILATION AND CURETTAGE OF UTERUS N/A 02/21/2013  Procedure: DILATATION AND CURETTAGE;  Surgeon: Lyman Speller, MD;  Location: [facility];  Service: Gynecology;  Laterality: N/A; . DILATION AND CURETTAGE OF UTERUS N/A 04/09/2015  Procedure: [place] IUD removal;  Surgeon: Megan Salon, MD;  Location: [facility];  Service: Gynecology;  Laterality: N/A;  Patient weight 307lbs . ESOPHAGOGASTRODUODENOSCOPY (EGD) WITH PROPOFOL N/A 02/02/2014  Procedure: ESOPHAGOGASTRODUODENOSCOPY (EGD) WITH PROPOFOL;  Surgeon: Irene Shipper, MD;  Location: [facility];  Service: Endoscopy;  Laterality: N/A; . GASTRIC BYPASS  1974 . HOLMIUM LASER APPLICATION Left 0000000  Procedure: HOLMIUM LASER APPLICATION;  Surgeon: Bernestine Amass, MD;  Location: [facility];  Service: Urology;  Laterality: Left; . LITHOTRIPSY  03/2012 . LITHOTRIPSY  2/16 . THYROIDECTOMY  05/15/2010 . TONSILLECTOMY AND ADENOIDECTOMY   . UPPER GASTROINTESTINAL ENDOSCOPY   . URETEROSCOPY   03/03/2012  Procedure: URETEROSCOPY;  Surgeon:  Bernestine Amass, MD;  Location: [facility];  Service: Urology;  Laterality: Left; . URETEROSCOPY WITH HOLMIUM LASER LITHOTRIPSY Bilateral 01/15/2018  Procedure: CYSTOSCOPY/BILATERAL URETEROSCOPY WITH HOLMIUM LASER LITHOTRIPSY STONE REMOVAL;  Surgeon: Ardis Hughs, MD;  Location: [facility];  Service: Urology;  Laterality: Bilateral; HPI: KAMYLLE DIDIER is a 64 y.o. female with medical history significant of paroxysmal A. fib, chronic diastolic CHF, COPD, history of thyroidectomy on Synthroid, GERD, hypertension, asthma, anxiety, depression, morbid obesity (BMI 41.19) presenting to the ED via EMS for evaluation of generalized weakness and altered mental status. She has not been eating much for the past few days.  Patient has also complained of choking to family when eating dry items such as toast. MRI was negative for acute abnormality and CXR was unremarkable.  Subjective: Pt was alert and pleasantly confused with sister and niece at bedside Assessment / Plan / Recommendation CH[facility] IP CLINICAL IMPRESSIONS 10/17/2019 Clinical Impression Pt's oropharyngeal dysphagia was inconsistent in relation to impaired timing and coordination of swallow sequence resulting in penetration/aspiration. When bolus was large and laryngeal closure delayed thin and nectar barium entered vestibule and either remained above the vocal cords, contacted cords or was aspirated (nectar x 1) with and without sensation (delayed cough x 2 throughout study). Other trials with smaller bolus and and laryngeal closure timely penetration/aspiration was prevented. Chin tuck was of no benefit. Orally pt exhibited anterior and posterior movement of boluses with delayed and incoordinated prolusion. Pt was awake however continues with being alert, timely processing which suspect affected integrity of her swallow. Slower transit of honey thick barium allowed for laryngeal vestibue to close without significant residue  (trace-min). Esophagus viewed however brief due to body habitus which did not reveal remarkable findings. Recommend upgrade liquid consistency to honey thick given inconsistent nature of penetration and sensation with thinner consistencies. Continue Dys 3 texture, crush pills, full supervision and assist with meals. Reviewed results with pt's sister. Prognosis is good for swallow improvements once cognition and overall medical status improves.    SLP Visit Diagnosis Dysphagia, oropharyngeal phase (R13.12) Attention and concentration deficit following -- Frontal lobe and executive function deficit following -- Impact on safety and function Mild aspiration risk;Moderate aspiration risk   CHL IP TREATMENT RECOMMENDATION 10/17/2019 Treatment Recommendations Therapy as outlined in treatment plan below   Prognosis 10/17/2019 Prognosis for Safe Diet Advancement Good Barriers to Reach Goals Cognitive deficits Barriers/Prognosis Comment -- CH[facility] IP DIET RECOMMENDATION 10/17/2019 SLP Diet Recommendations Dysphagia 3 (Mech soft) solids;Honey thick liquids Liquid Administration via Cup Medication Administration Crushed with puree Compensations Minimize environmental distractions;Slow rate;Small sips/bites Postural Changes Seated upright at 90 degrees   CHL IP OTHER RECOMMENDATIONS 10/17/2019 Recommended Consults -- Oral Care Recommendations Oral care BID Other Recommendations --   CHL IP FOLLOW UP RECOMMENDATIONS 10/17/2019 Follow up Recommendations Other (comment)   CHL IP FREQUENCY AND DURATION 10/17/2019 Speech Therapy Frequency (ACUTE ONLY) min 2x/week Treatment Duration 2 weeks      CHL IP ORAL PHASE 10/17/2019 Oral Phase Impaired Oral - Pudding Teaspoon -- Oral - Pudding Cup -- Oral - Honey Teaspoon -- Oral - Honey Cup Delayed oral transit;Weak lingual manipulation;Reduced posterior propulsion Oral - Nectar Teaspoon -- Oral - Nectar Cup Delayed oral transit;Weak lingual manipulation;Reduced posterior propulsion Oral - Nectar Straw  Delayed oral transit;Weak lingual manipulation;Reduced posterior propulsion Oral - Thin Teaspoon -- Oral - Thin Cup Delayed oral transit;Weak lingual manipulation;Reduced posterior propulsion Oral - Thin Straw -- Oral - Puree -- Oral - Mech Soft Delayed  oral transit Oral - Regular -- Oral - Multi-Consistency -- Oral - Pill -- Oral Phase - Comment --  CHL IP PHARYNGEAL PHASE 10/17/2019 Pharyngeal Phase Impaired Pharyngeal- Pudding Teaspoon -- Pharyngeal -- Pharyngeal- Pudding Cup -- Pharyngeal -- Pharyngeal- Honey Teaspoon -- Pharyngeal -- Pharyngeal- Honey Cup Pharyngeal residue - valleculae Pharyngeal -- Pharyngeal- Nectar Teaspoon -- Pharyngeal -- Pharyngeal- Nectar Cup Penetration/Aspiration during swallow;Other (Comment);Delayed swallow initiation-vallecula Pharyngeal Material enters airway, remains ABOVE vocal cords and not ejected out Pharyngeal- Nectar Straw Delayed swallow initiation-vallecula;Penetration/Aspiration during swallow Pharyngeal Material enters airway, passes BELOW cords without attempt by patient to eject out (silent aspiration) Pharyngeal- Thin Teaspoon -- Pharyngeal -- Pharyngeal- Thin Cup Penetration/Aspiration during swallow Pharyngeal -- Pharyngeal- Thin Straw -- Pharyngeal -- Pharyngeal- Puree -- Pharyngeal -- Pharyngeal- Mechanical Soft WFL Pharyngeal -- Pharyngeal- Regular -- Pharyngeal -- Pharyngeal- Multi-consistency -- Pharyngeal -- Pharyngeal- Pill -- Pharyngeal -- Pharyngeal Comment --  CHL IP CERVICAL ESOPHAGEAL PHASE 10/17/2019 Cervical Esophageal Phase WFL Pudding Teaspoon -- Pudding Cup -- Honey Teaspoon -- Honey Cup -- Nectar Teaspoon -- Nectar Cup -- Nectar Straw -- Thin Teaspoon -- Thin Cup -- Thin Straw -- Puree -- Mechanical Soft -- Regular -- Multi-consistency -- Pill -- Cervical Esophageal Comment -- [name] 10/17/2019, 2:52 PM Orbie Pyo Litaker M.Ed Actor Pager [phone] Office [phone]              CT Renal Stone  Study  Result Date: 10/14/2019 CLINICAL DATA:  Increasing weakness and altered mental status. Flank pain, kidney stone suspected EXAM: CT ABDOMEN AND PELVIS WITHOUT CONTRAST TECHNIQUE: Multidetector CT imaging of the abdomen and pelvis was performed following the standard protocol without IV contrast. COMPARISON:  Abdominal CT 12/12/2018 FINDINGS: Lower chest: Minor dependent atelectasis. No pleural effusion. Coronary artery calcifications. Normal heart size. Hepatobiliary: Previous low-density attenuation throughout the liver is no longer seen. No evidence of focal hepatic lesion. Liver contour somewhat nodular, also seen on prior. No calcified gallstone or pericholecystic inflammation. For gene cap in the gallbladder. No biliary dilatation. Pancreas: Parenchymal atrophy. No ductal dilatation or inflammation. Spleen: Again seen splenomegaly, spanning 13.8 cm cranial caudal. No focal abnormality. Adrenals/Urinary Tract: No adrenal nodule. Fat stranding about the left kidney, also seen on prior exam. Mild prominence of the left renal pelvis. There are multiple nonobstructing stones within the left kidney but no obstructing or ureteral calculus. Multiple nonobstructing stones throughout the right kidney. No right hydronephrosis. No right ureteral calculi. No right perinephric stranding. Urinary bladder is distended without wall thickening or bladder stone. No urethral stones visualized. Stomach/Bowel: Colonic tortuosity. Large volume of stool throughout the colon. No colonic wall thickening. No pericolonic inflammation. No obstruction. Appendix not definitively visualized. No evidence of appendicitis. There is no small bowel obstruction or inflammation. Stomach is decompressed. Vascular/Lymphatic: Moderate aortic atherosclerosis. Bi-iliac atherosclerosis. Coarse calcification causes luminal narrowing of the left common iliac artery. No adenopathy. Reproductive: IUD in the uterus, unusual for age. No adnexal mass.  Other: No free air, free fluid, or intra-abdominal fluid collection. Musculoskeletal: Diffuse degenerative change throughout the spine. Scattered bone islands in the pelvis. There are no acute or suspicious osseous abnormalities. IMPRESSION: 1. Bilateral nonobstructing nephrolithiasis. Mild prominence of the left renal pelvis with perinephric edema, similar to prior exam, may be chronic or seen with urinary tract infection. The possibility of recently passed stone is also considered. 2. Large volume of stool throughout the colon, can be seen with constipation. No bowel obstruction or inflammation. 3. Nodular hepatic contour, can be  seen with cirrhosis. Mild splenomegaly, unchanged. Aortic Atherosclerosis (ICD10-I70.0). Electronically Signed   By: Keith Rake M.D.   On: 10/14/2019 19:01   VAS [place] LOWER EXTREMITY VENOUS (DVT)  Result Date: 10/17/2019  Lower Venous DVTStudy Indications: Edema.  Limitations: Body habitus and Edema, patient grabbing my hand/probe. altered mental status, significant pitting edema. Comparison Study: No prior study on file Performing Technologist: Sharion Dove RVS  Examination Guidelines: A complete evaluation includes B-mode imaging, spectral Doppler, color Doppler, and power Doppler as needed of all accessible portions of each vessel. Bilateral testing is considered an integral part of a complete examination. Limited examinations for reoccurring indications may be performed as noted. The reflux portion of the exam is performed with the patient in reverse Trendelenburg.  +---------+---------------+---------+-----------+----------+-------------------+ RIGHT    CompressibilityPhasicitySpontaneityPropertiesThrombus Aging      +---------+---------------+---------+-----------+----------+-------------------+ CFV      Full           Yes      Yes                                      +---------+---------------+---------+-----------+----------+-------------------+ SFJ       Full                                                             +---------+---------------+---------+-----------+----------+-------------------+ FV Prox  Full                                                             +---------+---------------+---------+-----------+----------+-------------------+ FV Mid                  Yes      Yes                  patent by color and                                                       Doppler             +---------+---------------+---------+-----------+----------+-------------------+ FV Distal               Yes      Yes                  patent by color and                                                       Doppler             +---------+---------------+---------+-----------+----------+-------------------+ POP                     Yes      Yes  patent by color and                                                       Doppler             +---------+---------------+---------+-----------+----------+-------------------+ PTV      Full                                                             +---------+---------------+---------+-----------+----------+-------------------+ PERO     Full                                                             +---------+---------------+---------+-----------+----------+-------------------+   +---------+---------------+---------+-----------+----------+-------------------+ LEFT     CompressibilityPhasicitySpontaneityPropertiesThrombus Aging      +---------+---------------+---------+-----------+----------+-------------------+ CFV                     Yes      Yes                  patent by color and                                                       Doppler             +---------+---------------+---------+-----------+----------+-------------------+ SFJ                                                   Not visualized       +---------+---------------+---------+-----------+----------+-------------------+ FV Prox                 Yes      Yes                  patent by color and                                                       Doppler             +---------+---------------+---------+-----------+----------+-------------------+ FV Mid                  Yes      Yes                  patent by color and  Doppler             +---------+---------------+---------+-----------+----------+-------------------+ FV Distal               Yes      Yes                  patent by color and                                                       Doppler             +---------+---------------+---------+-----------+----------+-------------------+ PFV                     Yes      Yes                  patent by color and                                                       Doppler             +---------+---------------+---------+-----------+----------+-------------------+ PTV                     Yes      Yes                  patent by color and                                                       Doppler             +---------+---------------+---------+-----------+----------+-------------------+ PERO                                                  Not visualized      +---------+---------------+---------+-----------+----------+-------------------+     Summary: RIGHT: - There is no evidence of deep vein thrombosis in the lower extremity. However, portions of this examination were limited- see technologist comments above.  LEFT: - There is no evidence of deep vein thrombosis in the lower extremity. However, portions of this examination were limited- see technologist comments above.  *See table(s) above for measurements and observations. Electronically signed by Servando Snare MD on 10/17/2019 at 12:51:35 AM.    Final       The  results of significant diagnostics from this hospitalization (including imaging, microbiology, ancillary and laboratory) are listed below for reference.     Microbiology: Recent Results (from the past 240 hour(s))  Blood Culture (routine x 2)     Status: None   Collection Time: 10/14/19  1:20 PM   Specimen: BLOOD  Result Value Ref Range Status   Specimen Description BLOOD RIGHT ANTECUBITAL  Final   Special Requests   Final    BOTTLES DRAWN AEROBIC AND ANAEROBIC Blood Culture results may not be optimal due to an inadequate  volume of blood received in culture bottles   Culture   Final    NO GROWTH 5 DAYS Performed at [facility], [place] [address] [place], [place] [address]    Report Status 10/19/2019 FINAL  Final  Blood Culture (routine x 2)     Status: None   Collection Time: 10/14/19  1:25 PM   Specimen: BLOOD RIGHT HAND  Result Value Ref Range Status   Specimen Description BLOOD RIGHT HAND  Final   Special Requests   Final    BOTTLES DRAWN AEROBIC AND ANAEROBIC Blood Culture adequate volume   Culture   Final    NO GROWTH 5 DAYS Performed at [facility], [place] [address], [place], [place] [address]    Report Status 10/19/2019 FINAL  Final  Urine culture     Status: Abnormal   Collection Time: 10/14/19  3:10 PM   Specimen: In/Out Cath Urine  Result Value Ref Range Status   Specimen Description IN/OUT CATH URINE  Final   Special Requests   Final    NONE Performed at [facility], [place] [address], [place],  [address]    Culture >=100,000 COLONIES/mL ESCHERICHIA COLI (A)  Final   Report Status 10/16/2019 FINAL  Final   Organism ID, Bacteria ESCHERICHIA COLI (A)  Final      Susceptibility   Escherichia coli - MIC*    AMPICILLIN 4 SENSITIVE Sensitive     CEFAZOLIN <=4 SENSITIVE Sensitive     CEFTRIAXONE <=1 SENSITIVE Sensitive     CIPROFLOXACIN <=0.25 SENSITIVE Sensitive     GENTAMICIN <=1 SENSITIVE Sensitive     IMIPENEM <=0.25 SENSITIVE Sensitive      NITROFURANTOIN <=16 SENSITIVE Sensitive     TRIMETH/SULFA <=20 SENSITIVE Sensitive     AMPICILLIN/SULBACTAM 4 SENSITIVE Sensitive     PIP/TAZO <=4 SENSITIVE Sensitive     * >=100,000 COLONIES/mL ESCHERICHIA COLI  SARS Coronavirus 2 by RT PCR (hospital order, performed in [place] hospital lab) Nasopharyngeal Nasopharyngeal Swab     Status: None   Collection Time: 10/14/19  5:55 PM   Specimen: Nasopharyngeal Swab  Result Value Ref Range Status   SARS Coronavirus 2 NEGATIVE NEGATIVE Final    Comment: (NOTE) SARS-CoV-2 target nucleic acids are NOT DETECTED. The SARS-CoV-2 RNA is generally detectable in upper and lower respiratory specimens during the acute phase of infection. The lowest concentration of SARS-CoV-2 viral copies this assay can detect is 250 copies / mL. A negative result does not preclude SARS-CoV-2 infection and should not be used as the sole basis for treatment or other patient management decisions.  A negative result may occur with improper specimen collection / handling, submission of specimen other than nasopharyngeal swab, presence of viral mutation(s) within the areas targeted by this assay, and inadequate number of viral copies (<250 copies / mL). A negative result must be combined with clinical observations, patient history, and epidemiological information. Fact Sheet for Patients:   StrictlyIdeas.no Fact Sheet for Healthcare Providers: BankingDealers.co.za This test is not yet approved or cleared  by the [place] FDA and has been authorized for detection and/or diagnosis of SARS-CoV-2 by FDA under an Emergency Use Authorization (EUA).  This EUA will remain in effect (meaning this test can be used) for the duration of the COVID-19 declaration under Section 564(b)(1) of the Act, 21 U.S.C. section 360bbb-3(b)(1), unless the authorization is terminated or revoked sooner. Performed at [facility],  [place] [address], [place], [place]  [id number]   MRSA PCR Screening     Status: Abnormal   Collection Time: 10/18/19  7:55 AM   Specimen: Nasopharyngeal  Result Value Ref Range Status   MRSA by PCR POSITIVE (A) NEGATIVE Final    Comment:        The GeneXpert MRSA Assay (FDA approved for NASAL specimens only), is one component of a comprehensive MRSA colonization surveillance program. It is not intended to diagnose MRSA infection nor to guide or monitor treatment for MRSA infections. RESULT CALLED TO, READ BACK BY AND VERIFIED WITH: Mady Gemma RN 11:00 10/18/19 ([email or web address]) Performed at [facility], [place] [address] [place], [place] [address]      Labs: BNP (last 3 results) Recent Labs    11/21/18 1742 10/15/19 0446  BNP 56.3 [id number]   Basic Metabolic Panel: Recent Labs  Lab 10/14/19 1330 10/14/19 1330 10/14/19 1338 10/16/19 0753 10/17/19 1346 10/18/19 0731 10/18/19 1108 10/19/19 0337 10/20/19 0427  NA 141   < > 138 141 143 144  --   --  145  K 4.1   < > 3.8 3.7 4.2 3.8  --   --  3.8  CL 105  --   --  113* 112* 114*  --   --  116*  CO2 18*  --   --  17* 21* 20*  --   --  21*  GLUCOSE 85  --   --  97 99 69*  --   --  75  BUN 70*  --   --  53* 42* 39*  --   --  25*  CREATININE 2.06*  --   --  2.16* 2.09* 2.10*  --   --  1.63*  CALCIUM 8.2*  --   --  8.0* 8.4* 8.5*  --   --  9.1  MG  --   --   --   --  1.8  --  1.9 2.0 1.8   < > = values in this interval not displayed.   Liver Function Tests: Recent Labs  Lab 10/14/19 1330 10/16/19 0753  AST 21 25  ALT 24 22  ALKPHOS 125 105  BILITOT 1.4* 0.6  PROT 6.4* 5.5*  ALBUMIN 2.5* 2.0*   No results for input(s): LIPASE, AMYLASE in the last 168 hours. Recent Labs  Lab 10/17/19 2006 10/18/19 2202  AMMONIA 41* 36*   CBC: Recent Labs  Lab 10/14/19 1330 10/14/19 1338 10/17/19 1346 10/17/19 1346 10/18/19 0731 10/18/19 0731 10/18/19 1108 10/18/19 1330 10/19/19 0337 10/19/19 1101 10/20/19 0427  WBC 5.9   < > 5.7    < > 4.5   < > 5.3 5.2 4.5 5.9 5.9  NEUTROABS 5.3  --  4.7  --  3.5  --   --  4.0  --   --   --   HGB 7.8*   < > 7.0*   < > 7.9*   < > 8.3* 8.4* 7.9* 8.3* 8.1*  HCT 26.7*   < > 24.0*   < > 26.6*   < > 28.2* 28.3* 27.0* 28.6* 28.5*  MCV 94.7   < > 94.5   < > 94.3   < > 94.6 95.3 94.7 94.7 96.3  PLT 43*   < > 28*   < > PLATELET CLUMPS NOTED ON SMEAR, UNABLE TO ESTIMATE   < > PLATELET CLUMPS NOTED ON SMEAR, UNABLE TO ESTIMATE PLATELET CLUMPS NOTED ON SMEAR, UNABLE TO ESTIMATE PLATELET CLUMPS NOTED ON SMEAR, UNABLE  TO ESTIMATE PLATELETS APPEAR DECREASED 64*   < > = values in this interval not displayed.   Cardiac Enzymes: No results for input(s): CKTOTAL, CKMB, CKMBINDEX, TROPONINI in the last 168 hours. BNP: Invalid input(s): POCBNP CBG: Recent Labs  Lab 10/18/19 2339 10/19/19 0744 10/19/19 1533 10/20/19 0436 10/20/19 0509  GLUCAP 94 74 64* 68* 73   D-Dimer No results for input(s): DDIMER in the last 72 hours. Hgb A1c No results for input(s): HGBA1C in the last 72 hours. Lipid Profile No results for input(s): CHOL, HDL, LDLCALC, TRIG, CHOLHDL, LDLDIRECT in the last 72 hours. Thyroid function studies No results for input(s): TSH, T4TOTAL, T3FREE, THYROIDAB in the last 72 hours.  Invalid input(s): FREET3 Anemia work up No results for input(s): VITAMINB12, FOLATE, FERRITIN, TIBC, IRON, RETICCTPCT in the last 72 hours. Urinalysis    Component Value Date/Time   COLORURINE YELLOW 10/14/2019 1530   APPEARANCEUR CLOUDY (A) 10/14/2019 1530   LABSPEC 1.011 10/14/2019 1530   LABSPEC 1.010 06/09/2007 1500   PHURINE 5.0 10/14/2019 1530   GLUCOSEU NEGATIVE 10/14/2019 1530   GLUCOSEU NEGATIVE 02/16/2018 1202   HGBUR LARGE (A) 10/14/2019 1530   HGBUR negative 05/09/2010 1026   BILIRUBINUR NEGATIVE 10/14/2019 1530   BILIRUBINUR negative 04/06/2019 1434   BILIRUBINUR N 05/01/2016 1104   BILIRUBINUR Negative 06/09/2007 1500   KETONESUR NEGATIVE 10/14/2019 1530   PROTEINUR 100 (A)  10/14/2019 1530   UROBILINOGEN 0.2 04/06/2019 1434   UROBILINOGEN 0.2 02/16/2018 1202   NITRITE POSITIVE (A) 10/14/2019 1530   LEUKOCYTESUR MODERATE (A) 10/14/2019 1530   LEUKOCYTESUR Negative 06/09/2007 1500   Sepsis Labs Invalid input(s): PROCALCITONIN,  WBC,  LACTICIDVEN Microbiology Recent Results (from the past 240 hour(s))  Blood Culture (routine x 2)     Status: None   Collection Time: 10/14/19  1:20 PM   Specimen: BLOOD  Result Value Ref Range Status   Specimen Description BLOOD RIGHT ANTECUBITAL  Final   Special Requests   Final    BOTTLES DRAWN AEROBIC AND ANAEROBIC Blood Culture results may not be optimal due to an inadequate volume of blood received in culture bottles   Culture   Final    NO GROWTH 5 DAYS Performed at [facility], [place] [address] [place], [place] [address]    Report Status 10/19/2019 FINAL  Final  Blood Culture (routine x 2)     Status: None   Collection Time: 10/14/19  1:25 PM   Specimen: BLOOD RIGHT HAND  Result Value Ref Range Status   Specimen Description BLOOD RIGHT HAND  Final   Special Requests   Final    BOTTLES DRAWN AEROBIC AND ANAEROBIC Blood Culture adequate volume   Culture   Final    NO GROWTH 5 DAYS Performed at [facility], [place] [address] [place], [place] [address]    Report Status 10/19/2019 FINAL  Final  Urine culture     Status: Abnormal   Collection Time: 10/14/19  3:10 PM   Specimen: In/Out Cath Urine  Result Value Ref Range Status   Specimen Description IN/OUT CATH URINE  Final   Special Requests   Final    NONE Performed at [facility], [place] [address] [place], [place] [address]    Culture >=100,000 COLONIES/mL ESCHERICHIA COLI (A)  Final   Report Status 10/16/2019 FINAL  Final   Organism ID, Bacteria ESCHERICHIA COLI (A)  Final      Susceptibility   Escherichia coli - MIC*  AMPICILLIN 4 SENSITIVE Sensitive     CEFAZOLIN <=4 SENSITIVE Sensitive     CEFTRIAXONE <=1 SENSITIVE Sensitive      CIPROFLOXACIN <=0.25 SENSITIVE Sensitive     GENTAMICIN <=1 SENSITIVE Sensitive     IMIPENEM <=0.25 SENSITIVE Sensitive     NITROFURANTOIN <=16 SENSITIVE Sensitive     TRIMETH/SULFA <=20 SENSITIVE Sensitive     AMPICILLIN/SULBACTAM 4 SENSITIVE Sensitive     PIP/TAZO <=4 SENSITIVE Sensitive     * >=100,000 COLONIES/mL ESCHERICHIA COLI  SARS Coronavirus 2 by RT PCR (hospital order, performed in [place] hospital lab) Nasopharyngeal Nasopharyngeal Swab     Status: None   Collection Time: 10/14/19  5:55 PM   Specimen: Nasopharyngeal Swab  Result Value Ref Range Status   SARS Coronavirus 2 NEGATIVE NEGATIVE Final    Comment: (NOTE) SARS-CoV-2 target nucleic acids are NOT DETECTED. The SARS-CoV-2 RNA is generally detectable in upper and lower respiratory specimens during the acute phase of infection. The lowest concentration of SARS-CoV-2 viral copies this assay can detect is 250 copies / mL. A negative result does not preclude SARS-CoV-2 infection and should not be used as the sole basis for treatment or other patient management decisions.  A negative result may occur with improper specimen collection / handling, submission of specimen other than nasopharyngeal swab, presence of viral mutation(s) within the areas targeted by this assay, and inadequate number of viral copies (<250 copies / mL). A negative result must be combined with clinical observations, patient history, and epidemiological information. Fact Sheet for Patients:   StrictlyIdeas.no Fact Sheet for Healthcare Providers: BankingDealers.co.za This test is not yet approved or cleared  by the [place] FDA and has been authorized for detection and/or diagnosis of SARS-CoV-2 by FDA under an Emergency Use Authorization (EUA).  This EUA will remain in effect (meaning this test can be used) for the duration of the COVID-19 declaration under Section 564(b)(1) of the Act, 21  U.S.C. section 360bbb-3(b)(1), unless the authorization is terminated or revoked sooner. Performed at [facility], [place] [address] [place], [place] [address]   MRSA PCR Screening     Status: Abnormal   Collection Time: 10/18/19  7:55 AM   Specimen: Nasopharyngeal  Result Value Ref Range Status   MRSA by PCR POSITIVE (A) NEGATIVE Final    Comment:        The GeneXpert MRSA Assay (FDA approved for NASAL specimens only), is one component of a comprehensive MRSA colonization surveillance program. It is not intended to diagnose MRSA infection nor to guide or monitor treatment for MRSA infections. RESULT CALLED TO, READ BACK BY AND VERIFIED WITH: Mady Gemma RN 11:00 10/18/19 ([email or web address]) Performed at [facility], [place] [address], [place], [place] [address]      Time coordinating discharge:  I have spent 35 minutes face to face with the patient and on the ward discussing the patients care, assessment, plan and disposition with other care givers. >50% of the time was devoted counseling the patient about the risks and benefits of treatment/Discharge disposition and coordinating care.   SIGNED:   Damita Lack, MD  [facility] 10/20/2019, 9:10 AM   If 7PM-7AM, please contact night-coverage

## 2019-10-21 LAB — CBC
HCT: 29.6 % — ABNORMAL LOW (ref 36.0–46.0)
Hemoglobin: 8.4 g/dL — ABNORMAL LOW (ref 12.0–15.0)
MCH: 27.5 pg (ref 26.0–34.0)
MCHC: 28.4 g/dL — ABNORMAL LOW (ref 30.0–36.0)
MCV: 97 fL (ref 80.0–100.0)
Platelets: 71 10*3/uL — ABNORMAL LOW (ref 150–400)
RBC: 3.05 MIL/uL — ABNORMAL LOW (ref 3.87–5.11)
RDW: 17.7 % — ABNORMAL HIGH (ref 11.5–15.5)
WBC: 6.7 10*3/uL (ref 4.0–10.5)
nRBC: 0 % (ref 0.0–0.2)

## 2019-10-21 LAB — BASIC METABOLIC PANEL
Anion gap: 5 (ref 5–15)
BUN: 22 mg/dL (ref 8–23)
CO2: 21 mmol/L — ABNORMAL LOW (ref 22–32)
Calcium: 9.3 mg/dL (ref 8.9–10.3)
Chloride: 117 mmol/L — ABNORMAL HIGH (ref 98–111)
Creatinine, Ser: 1.49 mg/dL — ABNORMAL HIGH (ref 0.44–1.00)
GFR calc Af Amer: 43 mL/min — ABNORMAL LOW (ref >60)
GFR calc non Af Amer: 37 mL/min — ABNORMAL LOW (ref >60)
Glucose, Bld: 72 mg/dL (ref 70–99)
Potassium: 3.9 mmol/L (ref 3.5–5.1)
Sodium: 143 mmol/L (ref 135–145)

## 2019-10-21 LAB — GLUCOSE, CAPILLARY
Glucose-Capillary: 97 mg/dL (ref 70–99)
Glucose-Capillary: 71 mg/dL (ref 70–99)

## 2019-10-21 LAB — MAGNESIUM: Magnesium: 1.8 mg/dL (ref 1.7–2.4)

## 2019-10-21 MED ORDER — ENSURE ENLIVE PO LIQD
237.0000 mL | Freq: Two times a day (BID) | ORAL | Status: DC
  Administered 2019-10-21 – 2019-10-22 (×3): 237 mL via ORAL

## 2019-10-21 NOTE — Progress Notes (Signed)
Brief Interval Note  Subjective: [name] 64 y.o. female with medical history significant for atrial fibrillation, COPD, morbid obesity s/p gastric bipass, thyroidectomy induced hypothyroidism and pernicious anemia who presented with urosepsis and was found to have worsening thrombocytopenia.  Interval History: --patient found to be copper deficient, started on copper supplementation --Plt trending upward, findings consistent with thrombocytopenia 2/2 to severe urosepsis --Hgb stable  Objective: Vitals:   10/20/19 2037 10/21/19 0739  BP:  (!) 107/41  Pulse: 65 (!) 44  Resp: 15 15  Temp:  (!) 97.4 F (36.3 C)  SpO2: 100% 94%   CBC CBC Latest Ref Rng & Units 10/21/2019 10/20/2019 10/19/2019  WBC 4.0 - 10.5 K/uL 6.7 5.9 5.9  Hemoglobin 12.0 - 15.0 g/dL 8.4(L) 8.1(L) 8.3(L)  Hematocrit 36.0 - 46.0 % 29.6(L) 28.5(L) 28.6(L)  Platelets 150 - 400 K/uL 71(L) 64(L) PLATELETS APPEAR DECREASED     CMP Latest Ref Rng & Units 10/21/2019 10/20/2019 10/18/2019  Glucose 70 - 99 mg/dL 72 75 69(L)  BUN 8 - 23 mg/dL 22 25(H) 39(H)  Creatinine 0.44 - 1.00 mg/dL 1.49(H) 1.63(H) 2.10(H)  Sodium 135 - 145 mmol/L 143 145 144  Potassium 3.5 - 5.1 mmol/L 3.9 3.8 3.8  Chloride 98 - 111 mmol/L 117(H) 116(H) 114(H)  CO2 22 - 32 mmol/L 21(L) 21(L) 20(L)  Calcium 8.9 - 10.3 mg/dL 9.3 9.1 8.5(L)  Total Protein 6.5 - 8.1 g/dL - - -  Total Bilirubin 0.3 - 1.2 mg/dL - - -  Alkaline Phos 38 - 126 U/L - - -  AST 15 - 41 U/L - - -  ALT 0 - 44 U/L - - -     Assessment/Plan:  # Acute Thrombocytopenia # Chronic Normocytic Anemia --findings are most consistent with thrombocytopenia in the setting of severe sepsis --other possible etiologies include primary bone marrow disorder, pseudothrombocytopenia, liver dysfunction or nutritional deficiency.  --copper levels returned low, recommend PO supplementation with elemental iron 91m daily  --review of the peripheral blood film showed platelet clumping or  schistocytes, though low suspicion for thrombotic microangiopathy --hepatic dysfunction could be a consideration for her thrombocytopenia, though typically that is a slow, chronic process and does not result in an acute drop in Plt count. This is supported by the cirrhotic morphology with splenomegaly noted on CT from 5/21 and the elevated INR.  -- please hold home eliquis while Plt are <50. --if platelet count/anemia do not continue to rebound, can consider a bone marrow biopsy.  --recommend f/u with Hematology at [facility] the patient's primary Hematology provider.   JLedell Peoples MD Department of Hematology/Oncology CEdieat WAssenc[facility] RiversidePhone: [phone]: [phone]: [email or web address]

## 2019-10-21 NOTE — NC FL2 (Signed)
[place] LEVEL OF CARE SCREENING TOOL     IDENTIFICATION  Patient Name: [name] Birthdate: [birth year] Sex: female Admission Date (Current Location): 10/14/2019  [facility] and [place] Number:  Herbalist and Address:  [facility] [place], [place] [address], [place], [place] [address]      Provider Number: [id number]  Attending Physician Name and Address:  Damita Lack, MD  Relative Name and Phone Number:  Varvara Wallace Brother [phone]    Current Level of Care: Hospital Recommended Level of Care: [place] Prior Approval Number: [id number]  Date Approved/Denied: 12/11/16 PASRR Number:    Discharge Plan: SNF    Current Diagnoses: Patient Active Problem List   Diagnosis Date Noted  . Knee pain   . Hypothermia 10/15/2019  . Thrombocytopenia ([place]) 10/15/2019  . AKI (acute kidney injury) ([place]) 10/15/2019  . Mild neurocognitive disorder 06/28/2019  . Major depressive disorder   . Generalized anxiety disorder   . Chronic anticoagulation 12/16/2018  . Abnormal nuclear stress test 12/16/2018  . Debilitated patient 12/16/2018  . Pressure injury of skin 12/13/2018  . UTI (urinary tract infection) 12/13/2018  . Pyelonephritis 11/21/2018  . Sepsis due to Escherichia coli (E. coli) ([place]) 11/28/2017  . E coli bacteremia 11/28/2017  . Sepsis ([place]) 11/24/2017  . Hypokalemia 12/10/2016  . Hypocalcemia 12/10/2016  . Recurrent falls 12/09/2016  . Right ureteral stone 07/17/2014  . Osteomyelitis ([place]) 06/14/2014  . Pernicious anemia 02/20/2014  . Reflux esophagitis 02/02/2014  . Edema 11/23/2013  . Foot pain 03/31/2013  . Endometrial adenocarcinoma ([place]) 02/21/2013  . Trochanteric bursitis of left hip 09/13/2012  . Muscle cramps 08/12/2012  . Fatty liver 01/07/2012  . Mid back pain 09/30/2011  . Weight gain 07/02/2011  . Nonspecific abnormal unspecified cardiovascular function study 04/30/2011  . Hx of  papillary thyroid carcinoma 04/07/2011  . Low back pain 04/01/2011  . PAF (paroxysmal atrial fibrillation) 02/05/2011  . Hypoparathyroidism ([place]) 01/07/2011  . GERD (gastroesophageal reflux disease) 05/22/2009  . Leukocytosis 03/24/2009  . Morbid obesity ([place]) 12/20/2008  . Rhinosinusitis, recurrent 09/07/2008  . Vitamin D deficiency 05/10/2007  . Iron deficiency anemia secondary to malabsorption  05/10/2007  . Anemia 05/10/2007  . Hypothyroidism 01/05/2007  . Essential hypertension 01/05/2007  . Asthma 01/05/2007    Orientation RESPIRATION BLADDER Height & Weight     Self, Time, Situation, Place(periods of poor judgement and confusion)  Normal Incontinent Weight: 111 kg Height:  5\' 4"  (162.6 cm)  BEHAVIORAL SYMPTOMS/MOOD NEUROLOGICAL BOWEL NUTRITION STATUS  (none) (none) Incontinent Diet(Dysphagia 3 with honey thick liquids)  AMBULATORY STATUS COMMUNICATION OF NEEDS Skin   Total Care(Edge of bed and pull up on walker) Verbally Other (Comment)(stage II documented with foam dressing skin intact erythema blanchable and maceration/ MASD noted under panus)                       Personal Care Assistance Level of Assistance  Bathing, Feeding, Dressing Bathing Assistance: Maximum assistance Feeding assistance: Limited assistance Dressing Assistance: Maximum assistance     Functional Limitations Info  Sight, Hearing, Speech Sight Info: Adequate Hearing Info: Adequate Speech Info: Adequate    SPECIAL CARE FACTORS FREQUENCY  Speech therapy     PT Frequency: 5X OT Frequency: 5X     Speech Therapy Frequency: 5x week      Contractures Contractures Info: Not present    Additional Factors Info  Code Status, Allergies, Psychotropic, Insulin Sliding Scale,  Isolation Precautions, Suctioning Needs Code Status Info: Full Allergies Info: Nutritional Supplements, Other, Amoxicillin-pot Clavulanate, Ciprofloxacin, Food, Keflex, Penicillins, Rocephin , Tomato Psychotropic Info:  Hx of anxiety, depression not currently on any medications for this Insulin Sliding Scale Info: none Isolation Precautions Info: contact MRSA Suctioning Needs: none   Current Medications (10/21/2019):  This is the current hospital active medication list Current Facility-Administered Medications  Medication Dose Route Frequency Provider Last Rate Last Admin  . 0.9 %  sodium chloride infusion (Manually program via Guardrails IV Fluids)   Intravenous Once Jennye Boroughs, MD      . 0.9 %  sodium chloride infusion (Manually program via Guardrails IV Fluids)   Intravenous Once Jennye Boroughs, MD      . acetaminophen (TYLENOL) tablet 650 mg  650 mg Oral Q6H PRN Shela Leff, MD   650 mg at 10/17/19 [id number]   Or  . acetaminophen (TYLENOL) suppository 650 mg  650 mg Rectal Q6H PRN Shela Leff, MD      . albuterol (PROVENTIL) (2.5 MG/3ML) 0.083% nebulizer solution 2.5 mg  2.5 mg Nebulization Q6H PRN Shela Leff, MD      . calcitRIOL (ROCALTROL) capsule 2 mcg  2 mcg Oral Daily Shela Leff, MD   2 mcg at 10/20/19 0824  . Copper chloride 2mg  in 140mL 0.9% sodium chloride   Intravenous Daily Priscella Mann, RPH 50 mL/hr at 10/20/19 [id number] New Bag at 10/20/19 0952  . diclofenac Sodium (VOLTAREN) 1 % topical gel 2 g  2 g Topical QID Shela Leff, MD   2 g at 10/20/19 2029  . feeding supplement (ENSURE ENLIVE) (ENSURE ENLIVE) liquid 237 mL  237 mL Oral BID BM Amin, Ankit Chirag, MD      . lactulose (CHRONULAC) 10 GM/15ML solution 20 g  20 g Oral TID Damita Lack, MD   20 g at 10/20/19 2034  . levothyroxine (SYNTHROID) tablet 400 mcg  400 mcg Oral [id number] Shela Leff, MD   400 mcg at 10/21/19 0618  . loratadine (CLARITIN) tablet 10 mg  10 mg Oral Suzette Battiest, MD   10 mg at 10/21/19 0618  . MEDLINE mouth rinse  15 mL Mouth Rinse BID Patrecia Pour, MD   15 mL at 10/20/19 0825  . mirabegron ER (MYRBETRIQ) tablet 50 mg  50 mg Oral Daily Shela Leff, MD    50 mg at 10/20/19 0825  . mometasone-formoterol (DULERA) 200-5 MCG/ACT inhaler 2 puff  2 puff Inhalation BID Shela Leff, MD   2 puff at 10/20/19 2028  . montelukast (SINGULAIR) tablet 10 mg  10 mg Oral QHS Skeet Simmer, RPH   10 mg at 10/20/19 2028  . mupirocin ointment (BACTROBAN) 2 % 1 application  1 application Nasal BID Damita Lack, MD   1 application at 0000000 2027  . pantoprazole (PROTONIX) EC tablet 40 mg  40 mg Oral BID Shela Leff, MD   40 mg at 10/20/19 2028  . polyethylene glycol (MIRALAX / GLYCOLAX) packet 17 g  17 g Oral Daily Shela Leff, MD   17 g at 10/20/19 0826  . Resource ThickenUp Clear   Oral PRN Patrecia Pour, MD   Given at 10/18/19 1701  . senna-docusate (Senokot-S) tablet 1 tablet  1 tablet Oral Daily Patrecia Pour, MD   1 tablet at 10/20/19 [phone]  . traMADol (ULTRAM) tablet 50 mg  50 mg Oral Q6H PRN Shela Leff, MD   50 mg at 10/19/19 1835  . Vitamin  D (Ergocalciferol) (DRISDOL) capsule 50,000 Units  50,000 Units Oral Once per day on Mon Wed Fri Rathore, Vasundhra, MD   50,000 Units at 10/17/19 1034     Discharge Medications: Please see discharge summary for a list of discharge medications.  Relevant Imaging Results:  Relevant Lab Results:   Additional Information SSN [id number]  Carles Collet, RN

## 2019-10-21 NOTE — TOC Transition Note (Addendum)
Transition of Care [facility]) - CM/SW Discharge Note   Patient Details  Name: [name] MRN: [id number] Date of Birth: 06/12/1955  [facility] CM/SW Contact:  Carles Collet, RN Phone Number: 10/21/2019, 8:32 AM   Clinical Narrative:    08:30 Spoke w patient's brother [name], he confirms plan is for [facility]. He requested some clarification from MD, sent message to Dr Reesa Chew to follow up with him this morning. Reached out to Liborio Negrin Torres, admission coordinator w [facility]. She states [employer] Josem Kaufmann is still pending and she will notify this CM when it comes through so we can proceed with DC today.   14:30 Confirmed w Linus Orn that they still have not received insurance auth from [facility] at this time.       Barriers to Discharge: Insurance Authorization   Patient Goals and CMS Choice   CMS Medicare.gov Compare Post Acute Care list provided to:: Patient Represenative (must comment)(Brother [name])    Discharge Placement                       Discharge Plan and Services In-house Referral: NA Discharge Planning Services: CM Consult, Medication Assistance(Determine if recieving facility can supply copper) Post Acute Care Choice: [place]          DME Arranged: N/A DME Agency: NA       HH Arranged: NA HH Agency: NA        Social Determinants of Health (SDOH) Interventions     Readmission Risk Interventions Readmission Risk Prevention Plan 12/13/2018  Transportation Screening Complete  PCP or Specialist Appt within 3-5 Days Complete  HRI or [place] Complete  Social Work Consult for [place] Planning/Counseling Complete  Palliative Care Screening Not Applicable  Medication Review Press photographer) Referral to Pharmacy  Some recent data might be hidden

## 2019-10-21 NOTE — Progress Notes (Signed)
Patient seen and examined at bedside this morning.  No complaints doing well.  Still having brief episodes of mild delirium but easily directable.  Vital signs remained stable.  Attempted to call patient's brother multiple times without any response.  Met with sister-in-law at bedside this morning.  All the questions have been answered.  Charge summary completed on 10/20/2019.  Recommendations as stated previously.  No further changes.  Call with questions as needed.  Gerlean Ren MD [facility]

## 2019-10-21 NOTE — Plan of Care (Signed)

## 2019-10-22 LAB — CBC
HCT: 28.6 % — ABNORMAL LOW (ref 36.0–46.0)
Hemoglobin: 8.2 g/dL — ABNORMAL LOW (ref 12.0–15.0)
MCH: 27.3 pg (ref 26.0–34.0)
MCHC: 28.7 g/dL — ABNORMAL LOW (ref 30.0–36.0)
MCV: 95.3 fL (ref 80.0–100.0)
Platelets: UNDETERMINED 10*3/uL (ref 150–400)
RBC: 3 MIL/uL — ABNORMAL LOW (ref 3.87–5.11)
RDW: 17.2 % — ABNORMAL HIGH (ref 11.5–15.5)
WBC: 6.3 10*3/uL (ref 4.0–10.5)
nRBC: 0 % (ref 0.0–0.2)

## 2019-10-22 LAB — BASIC METABOLIC PANEL
Anion gap: 6 (ref 5–15)
BUN: 19 mg/dL (ref 8–23)
CO2: 20 mmol/L — ABNORMAL LOW (ref 22–32)
Calcium: 9.3 mg/dL (ref 8.9–10.3)
Chloride: 116 mmol/L — ABNORMAL HIGH (ref 98–111)
Creatinine, Ser: 1.34 mg/dL — ABNORMAL HIGH (ref 0.44–1.00)
GFR calc Af Amer: 49 mL/min — ABNORMAL LOW (ref >60)
GFR calc non Af Amer: 42 mL/min — ABNORMAL LOW (ref >60)
Glucose, Bld: 83 mg/dL (ref 70–99)
Potassium: 4 mmol/L (ref 3.5–5.1)
Sodium: 142 mmol/L (ref 135–145)

## 2019-10-22 LAB — MAGNESIUM: Magnesium: 1.6 mg/dL — ABNORMAL LOW (ref 1.7–2.4)

## 2019-10-22 LAB — GLUCOSE, CAPILLARY
Glucose-Capillary: 72 mg/dL (ref 70–99)
Glucose-Capillary: 78 mg/dL (ref 70–99)
Glucose-Capillary: 81 mg/dL (ref 70–99)

## 2019-10-22 NOTE — TOC Transition Note (Addendum)
Transition of Care [facility]) - CM/SW Discharge Note   Patient Details  Name: [name] MRN: [id number] Date of Birth: 03-31-1956  Transition of Care [facility]) CM/SW Contact:  Jacquelynn Cree Phone Number: 10/22/2019, 10:18 AM   Clinical Narrative:    Patient will [facility] to: [name]  Anticipated DC date: 10/22/19 Family notified: [name], bedside Transport by: Corey Harold   Per MD patient ready for DC to [facility]. RN, patient, patient's family, and facility notified of DC. Discharge Summary and FL2 sent to facility. RN to call report prior to discharge ([place]). DC packet on chart. Ambulance transport requested for patient.   CSW will sign off for now as social work intervention is no longer needed. Please consult [place] again if new needs arise.    Final next level of care: Skilled Nursing Facility Barriers to Discharge: No Barriers Identified   Patient Goals and CMS Choice   CMS Medicare.gov Compare Post Acute Care list provided to:: Patient Represenative (must comment)([name] [place]) Choice offered to / list presented to : Patient  Discharge Placement              Patient chooses bed at: [facility] Patient to be transferred to facility by: [facility]   Patient and family notified of of transfer: 10/22/19  Discharge Plan and Services In-house Referral: NA Discharge Planning Services: CM Consult, Medication Assistance(Determine if recieving facility can supply copper) Post Acute Care Choice: [place]          DME Arranged: N/A DME Agency: NA       HH Arranged: NA HH Agency: NA        Social Determinants of Health (SDOH) Interventions     Readmission Risk Interventions Readmission Risk Prevention Plan 12/13/2018  Transportation Screening Complete  PCP or Specialist Appt within 3-5 Days Complete  HRI or Home Care Consult Complete  Social Work Consult for [place] Planning/Counseling Complete  Palliative Care Screening Not  Applicable  Medication Review Press photographer) Referral to Pharmacy  Some recent data might be hidden

## 2019-10-22 NOTE — Progress Notes (Signed)
Patient did not leave yesterday due to pending insurance authorization. I briefly saw her this morning, no complaints.  Her brother was at bedside.  All the questions are answered by me. Proceed with single discharge plan as outlined on 10/20/2019.  Call with any further questions as needed.  Jamie Ren MD [facility]

## 2019-10-23 LAB — METHYLMALONIC ACID, SERUM: Methylmalonic Acid, Quantitative: 321 nmol/L (ref 0–378)

## 2019-10-26 NOTE — Progress Notes (Signed)
Cardiology Clinic Note   Patient Name: [name] Date of Encounter: 10/27/2019  Primary Care Provider:  Debbrah Alar, NP Primary Cardiologist:  Kirk Ruths, MD  Patient Profile    [name].  [name] 64 year old female presents today for follow-up evaluation of her essential hypertension and paroxysmal atrial fibrillation.  Past Medical History    Past Medical History:  Diagnosis Date  . Abnormal Pap smear    years ago/no biopsy  . Allergic rhinitis   . Aortic atherosclerosis   . Arthritis    back- lower  . Asthma   . Atrial fibrillation 12/2010   OFF XARELTO LAST MONTH DUE TO BLEEDING IN STOOL  . Bursitis of hip left  . Chronic anticoagulation 12/16/2018   CHADS VASC=2 for sex and H/O HTN- she is on Eliquis  . Chronic diastolic (congestive) heart failure    pt unaware of this  . COPD (chronic obstructive pulmonary disease) with chronic bronchitis   . Decreased pulses in feet   . Dysrhythmia    afib, followed by Dr. Stanford Breed   . Edema of both legs   . Endometrial adenocarcinoma 02/21/2013   S/p D and C, has mirena, being followed by GYN   . Esophagitis 02/02/2014   Distal, linear erosions, noted on endoscopy  . Essential hypertension 01/05/2007   Echo Nov June 2020- EF 50-55%, mild LVH, normal LA   . Fatty liver 01/07/2012  . Fibroid 1974   fibroid cyst on left fallopian tube  . Fibromyalgia   . Foot ulcer    AREA HEALED RIGHT FOOT  . Generalized anxiety disorder   . GERD (gastroesophageal reflux disease)   . Headache    occasional sinus headache   . Hepatomegaly   . History of blood transfusion JULY 2015  . History of cardiomegaly 12/06/2010   Noted on CT  . History of E. coli septicemia   . History of papillary thyroid carcinoma 04/07/2011  . Hypocalcemia 12/10/2016  . Hypokalemia 12/10/2016  . Hypoparathyroidism 01/07/2011  . Hypothyroidism   . Iron deficiency anemia secondary to malabsorption  05/10/2007   Qualifier: Diagnosis of  By: Wynona Luna    . Kidney stone 10-16-2015   passed on their own  . Leukocytosis 03/24/2009   cta cheQualifier: Diagnosis of  By: Wynona Luna    . Major depressive disorder   . Mild neurocognitive disorder 06/28/2019  . Morbid obesity   . MRSA infection   . Nephrolithiasis 2/16, 9/16   SEES DR Risa Grill  . Osteomyelitis 06/14/2014  . PAF (paroxysmal atrial fibrillation) 02/05/2011   Documented 2012- NSR since.  On chronic anticoagulation.  . Pernicious anemia 02/20/2014   followed by Debbrah Alar  . Pneumonia   . PONV (postoperative nausea and vomiting)   . Pyelonephritis 11/24/2018  . Rash    thighs and back  . Seizures    infancy secondary to fever  . Staph aureus infection   . Thoracic spondylosis 12/06/2010   Noted on CT  . Uterine cancer 2014   Mirena IUD  . UTI (urinary tract infection) 12/13/2018  . Vitamin B 12 deficiency   . Vitamin D deficiency 05/10/2007   Qualifier: Diagnosis of  By: Wynona Luna    Past Surgical History:  Procedure Laterality Date  . AMPUTATION Right 05/18/2014   Procedure: RIGHT FIFTH RAY AMPUTATION FOOT;  Surgeon: Wylene Simmer, MD;  Location: [facility];  Service: Orthopedics;  Laterality: Right;  . APPENDECTOMY    .  BUNIONECTOMY     bilateral  . COLONOSCOPY    . COLONOSCOPY WITH PROPOFOL N/A 02/02/2014   Procedure: COLONOSCOPY WITH PROPOFOL;  Surgeon: Irene Shipper, MD;  Location: [facility];  Service: Endoscopy;  Laterality: N/A;  . cyst on ovary removed     . CYSTOSCOPY W/ RETROGRADES  03/03/2012   Procedure: CYSTOSCOPY WITH RETROGRADE PYELOGRAM;  Surgeon: Bernestine Amass, MD;  Location: [facility];  Service: Urology;  Laterality: Bilateral;  . CYSTOSCOPY W/ URETERAL STENT PLACEMENT Right 11/25/2017   Procedure: CYSTOSCOPY WITH RETROGRADE RIGHT URETERAL STENT PLACEMENT;  Surgeon: Franchot Gallo, MD;  Location: [facility];  Service: Urology;  Laterality: Right;  . CYSTOSCOPY W/ URETERAL STENT PLACEMENT Right 01/15/2018   Procedure: RIGHT STENT  EXCHANGE;  Surgeon: Ardis Hughs, MD;  Location: [facility];  Service: Urology;  Laterality: Right;  . CYSTOSCOPY WITH RETROGRADE PYELOGRAM, URETEROSCOPY AND STENT PLACEMENT Left 08/25/2012   Procedure: CYSTOSCOPY WITH RETROGRADE PYELOGRAM, URETEROSCOPY;  Surgeon: Bernestine Amass, MD;  [facility];  Service: Urology;  Laterality: Left;  . CYSTOSCOPY WITH STENT PLACEMENT Left 01/15/2018   Procedure: LEFT STENT PLACEMENT;  Surgeon: Ardis Hughs, MD;  Location: [facility];  Service: Urology;  Laterality: Left;  . CYSTOSCOPY WITH STENT PLACEMENT Right 01/22/2018   Procedure: RIGHT STENT REMOVAL;  Surgeon: Ardis Hughs, MD;  [facility];  Service: Urology;  Laterality: Right;  . CYSTOSCOPY/URETEROSCOPY/HOLMIUM LASER/STENT PLACEMENT Right 12/23/2017   Procedure: RIGHT URETEROSCOPY STONE REMOVAL, HOLMIUM LASER, RIGHT /STENT EXCHANGE;  Surgeon: Ardis Hughs, MD;  [facility];  Service: Urology;  Laterality: Right;  . CYSTOSCOPY/URETEROSCOPY/HOLMIUM LASER/STENT PLACEMENT Left 01/22/2018   Procedure: LEFT URETEROSCOPY STONE REMOVAL HOLMIUM LASER LEFT STENT Freddi Starr;  Surgeon: Ardis Hughs, MD;  Location: [facility];  Service: Urology;  Laterality: Left;  . DILATION AND CURETTAGE OF UTERUS N/A 02/21/2013   Procedure: DILATATION AND CURETTAGE;  Surgeon: Lyman Speller, MD;  Location: [facility];  Service: Gynecology;  Laterality: N/A;  . DILATION AND CURETTAGE OF UTERUS N/A 04/09/2015   Procedure: [place] IUD removal;  Surgeon: Megan Salon, MD;  Location: [facility];  Service: Gynecology;  Laterality: N/A;  Patient weight 307lbs  . ESOPHAGOGASTRODUODENOSCOPY (EGD) WITH PROPOFOL N/A 02/02/2014   Procedure: ESOPHAGOGASTRODUODENOSCOPY (EGD) WITH PROPOFOL;  Surgeon: Irene Shipper, MD;  [facility];  Service: Endoscopy;  Laterality: N/A;  . GASTRIC BYPASS  1974  . HOLMIUM LASER APPLICATION Left 0000000   Procedure: HOLMIUM LASER  APPLICATION;  Surgeon: Bernestine Amass, MD;  Location[facility];  Service: Urology;  Laterality: Left;  . LITHOTRIPSY  03/2012  . LITHOTRIPSY  2/16  . THYROIDECTOMY  05/15/2010  . TONSILLECTOMY AND ADENOIDECTOMY    . UPPER GASTROINTESTINAL ENDOSCOPY    . URETEROSCOPY  03/03/2012   Procedure: URETEROSCOPY;  Surgeon: Bernestine Amass, MD;  [facility];  Service: Urology;  Laterality: Left;  . URETEROSCOPY WITH HOLMIUM LASER LITHOTRIPSY Bilateral 01/15/2018   Procedure: CYSTOSCOPY/BILATERAL URETEROSCOPY WITH HOLMIUM LASER LITHOTRIPSY STONE REMOVAL;  Surgeon: Ardis Hughs, MD;  Location[facility];  Service: Urology;  Laterality: Bilateral;    Allergies  Allergies  Allergen Reactions  . Other Anaphylaxis    Reaction to tree nuts  . Amoxicillin-Pot Clavulanate Other (See Comments)    headache  . Ciprofloxacin Nausea Only and Rash  . Food Hives and Other (See Comments)    Potato  . Keflex [Cephalexin] Rash  . Penicillins Rash    Headache.  And hives - Dose not tolerate Cephalosporin either   Has patient had a PCN reaction causing immediate rash, facial/tongue/throat swelling, SOB or lightheadedness with hypotension: No Has patient had a PCN reaction causing severe rash involving mucus membranes or skin necrosis: No Has patient had a PCN reaction that required hospitalization: No Has patient had a PCN reaction occurring within the last 10 years: Yes (reaction was May 2020) If all of the above answers are "NO", then may proce  . Rocephin [Ceftriaxone] Rash  . Tomato Hives    History of Present Illness    Ms. Florido has a PMH of paroxysmal atrial fibrillation.  Her nuclear stress test 712 showed an EF of 64% mild ischemia in the anterior wall.  Medical therapy was felt to be the best course of treatment.  She also indicated she was having palpitations and near syncope.  A CardioNet study showed sinus rhythm with PACs and brief runs of paroxysmal atrial tachycardia and brief runs of  paroxysmal atrial fibrillation.  An MRA 9/14 showed no aneurysm.  Her Xarelto was DC'd previously due to GI bleeds; WU showed esophagitis and her anticoagulation was resumed.  Her ABIs 11/19 showed no evidence of significant right or lower extremity arterial disease.  An echocardiogram 6/20 showed normal LV function.  Lower extremity venous Dopplers 5/21 showed no lower extremity DVTs.  She was last seen by Dr. Stanford Breed on 06/17/2019.  During that time she denied dyspnea, chest pain, syncope.  Her lower extremity edema was well controlled.  She continued to have occasional flutters in her chest that would last for around 20 seconds.  She was then the emergency department and admitted on 10/14/2019 due to an acute urinary tract infection.  (Sepsis secondary to UTI in this presence of E. coli.  Acute metabolic/hepatic encephalopathy)  She presents to the clinic today for follow-up evaluation and states she is feeling much better.  She is currently residing at [facility].  Her blood pressure has been well controlled.  She continues lactulose for her encephalopathy.  When asked if she had any pain today she states she is hungry.  She is doing physical therapy 2 times per day.  I will give her the salty 6 dietary she and have her follow-up with Dr. Stanford Breed in 3 months.  Today she denies chest pain, shortness of breath, lower extremity edema, fatigue, palpitations, melena, hematuria, hemoptysis, diaphoresis, weakness, presyncope, syncope, orthopnea, and PND.   Home Medications    Prior to Admission medications   Medication Sig Start Date End Date Taking? Authorizing Provider  acetaminophen (TYLENOL) 325 MG tablet Take 2 tablets (650 mg total) by mouth every 6 (six) hours as needed for mild pain or moderate pain. 12/08/16   Waynetta Pean, PA-C  albuterol (PROVENTIL HFA;VENTOLIN HFA) 108 (90 Base) MCG/ACT inhaler Inhale 2 puffs into the lungs every 6 (six) hours as needed for wheezing or shortness of  breath. 11/10/16   Debbrah Alar, NP  albuterol (PROVENTIL) (2.5 MG/3ML) 0.083% nebulizer solution Take 3 mLs (2.5 mg total) by nebulization every 6 (six) hours as needed for wheezing or shortness of breath. 01/29/18   Debbrah Alar, NP  apixaban (ELIQUIS) 5 MG TABS tablet Take 1 tablet (5 mg total) by mouth 2 (two) times daily. 09/19/19   Lelon Perla, MD  calcitRIOL (ROCALTROL) 0.5 MCG capsule Take 2 mcg by mouth daily.     [provider]  Copper Gluconate (COPPER CAPS) 2 MG CAPS Take 1 tablet by mouth daily.  10/20/19   Amin, Jeanella Flattery, MD  CRANBERRY CONCENTRATE PO Take 30,000 mg by mouth daily.    [provider]  cyanocobalamin (,VITAMIN B-12,) 1000 MCG/ML injection ADMINISTER 1ML (1,000 MCG) UNDER THE SKIN EVERY 30 DAYS Patient taking differently: Inject 1,000 mcg into the muscle every 30 (thirty) days.  08/15/19   Debbrah Alar, NP  desoximetasone (TOPICORT) 0.05 % cream Apply topically 2 (two) times daily as needed. Patient taking differently: Apply 1 application topically 2 (two) times daily as needed (irritation).  01/29/17   Debbrah Alar, NP  diclofenac sodium (VOLTAREN) 1 % GEL Apply 2 g topically 4 (four) times daily. Patient taking differently: Apply 2 g topically 4 (four) times daily as needed (pain).  05/05/18   Debbrah Alar, NP  diltiazem (CARDIZEM CD) 120 MG 24 hr capsule Take 1 capsule (120 mg total) by mouth daily. 06/29/19 10/14/19  Debbrah Alar, NP  EPINEPHrine (EPIPEN 2-PAK) 0.3 mg/0.3 mL IJ SOAJ injection Inject 0.3 mg into the muscle once as needed for anaphylaxis (severe allergic reaction).     [provider]  furosemide (LASIX) 40 MG tablet Take 1 tablet (40 mg total) by mouth 2 (two) times daily. Patient taking differently: Take 20 mg by mouth 2 (two) times daily.  06/17/19   Lelon Perla, MD  hydrocortisone cream 1 % Apply topically 4 (four) times daily as needed for itching. 10/24/18   Cherylann Ratel A, DO    lactulose (CHRONULAC) 10 GM/15ML solution Take 30 mLs (20 g total) by mouth 3 (three) times daily. Titrate to 3 soft bowel movements daily. 10/20/19   Amin, Ankit Chirag, MD  levocetirizine (XYZAL) 5 MG tablet TAKE 1 TABLET BY MOUTH  EVERY EVENING Patient taking differently: Take 5 mg by mouth every morning.  08/18/18   Debbrah Alar, NP  levothyroxine (SYNTHROID, LEVOTHROID) 100 MCG tablet Take 400 mcg by mouth daily before breakfast.     [provider]  Melatonin 5 MG TABS Take 5 mg by mouth at bedtime.     [provider]  MYRBETRIQ 50 MG TB24 tablet Take 50 mg by mouth daily. 07/25/19   [provider]  nystatin (MYCOSTATIN/NYSTOP) powder Apply topically 2 (two) times daily. Patient taking differently: Apply 1 application topically 2 (two) times daily as needed (irritation).  12/22/18   Debbrah Alar, NP  nystatin cream (MYCOSTATIN) Apply 1 application topically 2 (two) times daily. 01/21/19   Debbrah Alar, NP  omeprazole (PRILOSEC) 40 MG capsule Take 1 capsule (40 mg total) by mouth in the morning and at bedtime. 09/08/19   Levin Erp, PA  ondansetron (ZOFRAN) 4 MG tablet Take 1 tablet (4 mg total) by mouth every 8 (eight) hours as needed for nausea or vomiting. 12/30/17   Debbrah Alar, NP  senna-docusate (SENOKOT-S) 8.6-50 MG tablet Take 1 tablet by mouth at bedtime as needed for mild constipation. 10/20/19   Amin, Jeanella Flattery, MD  simethicone (GAS-X) 80 MG chewable tablet Chew 1 tablet by mouth 4 (four) times daily as needed for flatulence.     [provider]  spironolactone (ALDACTONE) 25 MG tablet Take 1 tablet (25 mg total) by mouth daily. 07/20/19   Debbrah Alar, NP  SYMBICORT 160-4.5 MCG/ACT inhaler INHALE 2 PUFFS BY MOUTH TWO TIMES DAILY Patient taking differently: Inhale 2 puffs into the lungs in the morning and at bedtime.  12/16/18   Debbrah Alar, NP  Syringe/Needle, Disp, (SYRINGE 3CC/20GX1") 20G X 1" 3  ML MISC Use monthly as directed  Patient taking differently: 1 each by Other route every 30 (thirty) days.  08/04/18   Debbrah Alar, NP  traMADol (ULTRAM) 50 MG tablet Take 1 tablet (50 mg total) by mouth every 12 (twelve) hours as needed for moderate pain. TAKE 1 TABLET(50 MG) BY MOUTH EVERY 12 HOURS AS NEEDED 10/20/19   Amin, Jeanella Flattery, MD  Vitamin D, Ergocalciferol, (DRISDOL) 1.25 MG (50000 UNIT) CAPS capsule TAKE 1 CAPSULE BY MOUTH  EVERY MONDAY, WEDNESDAY,  AND FRIDAY. Patient taking differently: Take 50,000 Units by mouth 3 (three) times a week. TAKE 1 CAPSULE BY MOUTH  EVERY MONDAY, WEDNESDAY,  AND FRIDAY. 09/16/19   Debbrah Alar, NP  zafirlukast (ACCOLATE) 20 MG tablet Take 1 tablet (20 mg total) by mouth 2 (two) times daily before a meal. 07/20/19   Debbrah Alar, NP    Family History    Family History  Problem Relation Age of Onset  . Hypertension Father   . Diabetes Father   . Lung cancer Father   . Heart attack Father        MI at age 79  . Hypertension Mother   . Hyperthyroidism Mother   . Heart disease Mother   . Heart attack Mother        MI at age 68  . Asthma Brother   . Hypertension Brother        younger  . Heart disease Brother        older  . Dementia Paternal Aunt        Unspecified type; symptoms emerged with advanced age  . Colon cancer Neg Hx   . Esophageal cancer Neg Hx   . Stomach cancer Neg Hx   . Kidney disease Neg Hx   . Liver disease Neg Hx   . Pancreatic cancer Neg Hx    She indicated that her mother is deceased. She indicated that her father is deceased. She indicated that her sister is alive. She indicated that only one of her three brothers is alive. She indicated that the status of her paternal aunt is unknown. She indicated that the status of her neg hx is unknown.  Social History    Social History   Socioeconomic History  . Marital status: Single    Spouse name: Not on file  . Number of children: 0  . Years of  education: 58  . Highest education level: Some college, no degree  Occupational History  . Occupation: works in Insurance claims handler  . Occupation: DESIGN COMPUTER CHIP    Employer: [employer]  Tobacco Use  . Smoking status: Former Smoker    Packs/day: 0.50    Years: 25.00    Pack years: 12.50    Types: Cigarettes    Start date: 12/24/1970    Quit date: 05/27/1995    Years since quitting: 24.4  . Smokeless tobacco: Never Used  Substance and Sexual Activity  . Alcohol use: Yes    Alcohol/week: 0.0 standard drinks    Comment: <12 oz per month  . Drug use: No  . Sexual activity: Yes    Partners: Male    Birth control/protection: Post-menopausal  Other Topics Concern  . Not on file  Social History Narrative   Occupation: works in Insurance claims handler - Field seismologist   Single       Former Smoker - quit tobacco 12 years ago.  She was light smoker for 10 years.             College  edu      Right handed      Lives alone in one story home. Has steps to enter home.      Social Determinants of Health   Financial Resource Strain:   . Difficulty of Paying Living Expenses:   Food Insecurity:   . Worried About Charity fundraiser in the Last Year:   . Arboriculturist in the Last Year:   Transportation Needs:   . Film/video editor (Medical):   [name] Lack of Transportation (Non-Medical):   Physical Activity:   . Days of Exercise per Week:   . Minutes of Exercise per Session:   Stress:   . Feeling of Stress :   Social Connections:   . Frequency of Communication with Friends and Family:   . Frequency of Social Gatherings with Friends and Family:   . Attends Religious Services:   . Active Member of Clubs or Organizations:   . Attends Archivist Meetings:   [name] Marital Status:   Intimate Partner Violence:   . Fear of Current or Ex-Partner:   . Emotionally Abused:   [name] Physically Abused:   . Sexually Abused:      Review of Systems    General:  No chills,  fever, night sweats or weight changes.  Cardiovascular:  No chest pain, dyspnea on exertion, edema, orthopnea, palpitations, paroxysmal nocturnal dyspnea. Dermatological: No rash, lesions/masses Respiratory: No cough, dyspnea Urologic: No hematuria, dysuria Abdominal:   No nausea, vomiting, diarrhea, bright red blood per rectum, melena, or hematemesis Neurologic:  No visual changes, wkns, changes in mental status. All other systems reviewed and are otherwise negative except as noted above.  Physical Exam    VS:  BP 112/62   Pulse 84   Temp 98.1 F (36.7 C)   Ht 5\' 7"  (1.702 m)   LMP 06/11/2012   SpO2 98%   BMI 38.33 kg/m  , BMI Body mass index is 38.33 kg/m. GEN: Well nourished, well developed, in no acute distress. HEENT: normal. Neck: Supple, no JVD, carotid bruits, or masses. Cardiac: RRR, no murmurs, rubs, or gallops. No clubbing, cyanosis, edema.  Radials/DP/PT 2+ and equal bilaterally.  Respiratory:  Respirations regular and unlabored, clear to auscultation bilaterally. GI: Soft, nontender, nondistended, BS + x 4. MS: no deformity or atrophy. Skin: warm and dry, no rash. Neuro:  Strength and sensation are intact. Psych: Normal affect.  Accessory Clinical Findings    ECG personally reviewed by me today-none today.  Echocardiogram 11/22/2018 IMPRESSIONS    1. The left ventricle has low normal systolic function, with an ejection  fraction of 50-55%. The cavity size was normal. There is mildly increased  left ventricular wall thickness. Left ventricular diastolic Doppler  parameters are consistent with  pseudonormalization.  2. The right ventricle has normal systolic function. The cavity was  normal. There is no increase in right ventricular wall thickness.  3. The tricuspid valve is grossly normal.  4. The aortic valve is grossly normal.    Lower extremity venous duplex 10/16/2019 Summary:  RIGHT:  - There is no evidence of deep vein thrombosis in the lower  extremity.  However, portions of this examination were limited- see technologist  comments above.    LEFT:  - There is no evidence of deep vein thrombosis in the lower extremity.  However, portions of this examination were limited- see technologist  comments above.  Assessment & Plan   1.  PAF-heart rate today 84 bpm.  Patient previously discontinued anticoagulation on her own due to bruising.  Also has history of GI bleed.  Risk of CVA again discussed/reviewed.  She expressed understanding. Continue Cardizem Avoid triggers caffeine, chocolate, EtOH etc. Heart healthy low-sodium diet Increase physical activity as tolerated  Essential hypertension-BP today 112/62.  Well-controlled at [facility]. Continue continue Cardizem, spironolactone Heart healthy low-sodium diet-salty 6 given Increase physical activity as tolerated  Lower extremity edema-euvolemic today.  Recent lower extremity venous Dopplers negative for DVT bilaterally.  Creatinine on 10/22/2019 1.34 Continue furosemide, spironolactone Heart healthy low-sodium diet-salty 6 given Increase physical activity as tolerated Lower extremity support stockings Elevate lower extremities when not active  Morbid obesity Continue weight loss Or healthy low-sodium diet Increase physical activity as tolerated  Disposition: Follow-up with Dr. Stanford Breed in 3 months.  Jossie Ng. Stephfon Bovey NP-C    10/27/2019, 11:44 AM [address] [place] [address] Office [phone] Fax [phone]

## 2019-10-27 ENCOUNTER — Other Ambulatory Visit: Payer: Self-pay

## 2019-10-27 ENCOUNTER — Ambulatory Visit (INDEPENDENT_AMBULATORY_CARE_PROVIDER_SITE_OTHER): Payer: 59 | Admitting: General Practice

## 2019-10-27 ENCOUNTER — Encounter: Payer: Self-pay | Admitting: General Practice

## 2019-10-27 VITALS — BP 112/62 | HR 84 | Temp 98.1°F | Ht 67.0 in

## 2019-10-27 DIAGNOSIS — I480 Paroxysmal atrial fibrillation: Secondary | ICD-10-CM | POA: Diagnosis not present

## 2019-10-27 DIAGNOSIS — I10 Essential (primary) hypertension: Secondary | ICD-10-CM

## 2019-10-27 DIAGNOSIS — R609 Edema, unspecified: Secondary | ICD-10-CM | POA: Diagnosis not present

## 2019-10-27 NOTE — Patient Instructions (Signed)
Medication Instructions:  The current medical regimen is effective;  continue present plan and medications as directed. Please refer to the Current Medication list given to you today. *If you need a refill on your cardiac medications before your next appointment, please call your pharmacy*  Special Instructions PLEASE READ AND FOLLOW SALTY 6-ATTACHED  Follow-Up: Your next appointment:  3 month(s) Please call our office 2 months in advance to schedule this appointment In Person with Kirk Ruths, MD  At [facility], you and your health needs are our priority.  As part of our continuing mission to provide you with exceptional heart care, we have created designated Provider Care Teams.  These Care Teams include your primary Cardiologist (physician) and Advanced Practice Providers (APPs -  Physician Assistants and Nurse Practitioners) who all work together to provide you with the care you need, when you need it.

## 2019-10-31 ENCOUNTER — Other Ambulatory Visit: Payer: Self-pay

## 2019-10-31 ENCOUNTER — Telehealth: Payer: Self-pay | Admitting: Family

## 2019-10-31 ENCOUNTER — Inpatient Hospital Stay: Payer: 59 | Attending: Hematology & Oncology

## 2019-10-31 ENCOUNTER — Encounter: Payer: Self-pay | Admitting: Family

## 2019-10-31 ENCOUNTER — Inpatient Hospital Stay (HOSPITAL_BASED_OUTPATIENT_CLINIC_OR_DEPARTMENT_OTHER): Payer: 59 | Admitting: Family

## 2019-10-31 VITALS — BP 89/54 | HR 94 | Temp 96.4°F | Resp 18

## 2019-10-31 DIAGNOSIS — D509 Iron deficiency anemia, unspecified: Secondary | ICD-10-CM | POA: Diagnosis present

## 2019-10-31 DIAGNOSIS — D500 Iron deficiency anemia secondary to blood loss (chronic): Secondary | ICD-10-CM | POA: Diagnosis not present

## 2019-10-31 DIAGNOSIS — I482 Chronic atrial fibrillation, unspecified: Secondary | ICD-10-CM | POA: Insufficient documentation

## 2019-10-31 DIAGNOSIS — G629 Polyneuropathy, unspecified: Secondary | ICD-10-CM | POA: Diagnosis not present

## 2019-10-31 DIAGNOSIS — K909 Intestinal malabsorption, unspecified: Secondary | ICD-10-CM | POA: Diagnosis not present

## 2019-10-31 DIAGNOSIS — Z7901 Long term (current) use of anticoagulants: Secondary | ICD-10-CM | POA: Diagnosis not present

## 2019-10-31 DIAGNOSIS — D510 Vitamin B12 deficiency anemia due to intrinsic factor deficiency: Secondary | ICD-10-CM | POA: Diagnosis not present

## 2019-10-31 DIAGNOSIS — Z79899 Other long term (current) drug therapy: Secondary | ICD-10-CM | POA: Insufficient documentation

## 2019-10-31 DIAGNOSIS — Z9884 Bariatric surgery status: Secondary | ICD-10-CM | POA: Insufficient documentation

## 2019-10-31 DIAGNOSIS — M7989 Other specified soft tissue disorders: Secondary | ICD-10-CM | POA: Insufficient documentation

## 2019-10-31 DIAGNOSIS — R5383 Other fatigue: Secondary | ICD-10-CM | POA: Diagnosis not present

## 2019-10-31 DIAGNOSIS — D631 Anemia in chronic kidney disease: Secondary | ICD-10-CM

## 2019-10-31 LAB — CBC WITH DIFFERENTIAL (CANCER CENTER ONLY)
Abs Immature Granulocytes: 0.34 10*3/uL — ABNORMAL HIGH (ref 0.00–0.07)
Basophils Absolute: 0.1 10*3/uL (ref 0.0–0.1)
Basophils Relative: 1 %
Eosinophils Absolute: 0.1 10*3/uL (ref 0.0–0.5)
Eosinophils Relative: 2 %
HCT: 38 % (ref 36.0–46.0)
Hemoglobin: 11.4 g/dL — ABNORMAL LOW (ref 12.0–15.0)
Immature Granulocytes: 4 %
Lymphocytes Relative: 6 %
Lymphs Abs: 0.5 10*3/uL — ABNORMAL LOW (ref 0.7–4.0)
MCH: 26.6 pg (ref 26.0–34.0)
MCHC: 30 g/dL (ref 30.0–36.0)
MCV: 88.6 fL (ref 80.0–100.0)
Monocytes Absolute: 0.4 10*3/uL (ref 0.1–1.0)
Monocytes Relative: 4 %
Neutro Abs: 7.5 10*3/uL (ref 1.7–7.7)
Neutrophils Relative %: 83 %
Platelet Count: 476 10*3/uL — ABNORMAL HIGH (ref 150–400)
RBC: 4.29 MIL/uL (ref 3.87–5.11)
RDW: 16.6 % — ABNORMAL HIGH (ref 11.5–15.5)
WBC Count: 9 10*3/uL (ref 4.0–10.5)
nRBC: 0 % (ref 0.0–0.2)

## 2019-10-31 LAB — FERRITIN: Ferritin: 1764 ng/mL — ABNORMAL HIGH (ref 11–307)

## 2019-10-31 LAB — CMP (CANCER CENTER ONLY)
ALT: 22 U/L (ref 0–44)
AST: 25 U/L (ref 15–41)
Albumin: 3.4 g/dL — ABNORMAL LOW (ref 3.5–5.0)
Alkaline Phosphatase: 164 U/L — ABNORMAL HIGH (ref 38–126)
Anion gap: 11 (ref 5–15)
BUN: 22 mg/dL (ref 8–23)
CO2: 28 mmol/L (ref 22–32)
Calcium: 10.7 mg/dL — ABNORMAL HIGH (ref 8.9–10.3)
Chloride: 95 mmol/L — ABNORMAL LOW (ref 98–111)
Creatinine: 1.62 mg/dL — ABNORMAL HIGH (ref 0.44–1.00)
GFR, Est AFR Am: 39 mL/min — ABNORMAL LOW (ref >60)
GFR, Estimated: 34 mL/min — ABNORMAL LOW (ref >60)
Glucose, Bld: 110 mg/dL — ABNORMAL HIGH (ref 70–99)
Potassium: 3.4 mmol/L — ABNORMAL LOW (ref 3.5–5.1)
Sodium: 134 mmol/L — ABNORMAL LOW (ref 135–145)
Total Bilirubin: 0.5 mg/dL (ref 0.3–1.2)
Total Protein: 7.3 g/dL (ref 6.5–8.1)

## 2019-10-31 LAB — IRON AND TIBC
Iron: 41 ug/dL (ref 41–142)
Saturation Ratios: 21 % (ref 21–57)
TIBC: 193 ug/dL — ABNORMAL LOW (ref 236–444)
UIBC: 152 ug/dL (ref 120–384)

## 2019-10-31 LAB — RETICULOCYTES
Immature Retic Fract: 26.5 % — ABNORMAL HIGH (ref 2.3–15.9)
RBC.: 4.22 MIL/uL (ref 3.87–5.11)
Retic Count, Absolute: 149 10*3/uL (ref 19.0–186.0)
Retic Ct Pct: 3.5 % — ABNORMAL HIGH (ref 0.4–3.1)

## 2019-10-31 NOTE — Telephone Encounter (Signed)
Appointments scheduled calendar printed per 6/7 los 

## 2019-10-31 NOTE — Progress Notes (Signed)
Hematology and Oncology Follow Up Visit  [name] [id number] Nov 05, 1955 64 y.o. 10/31/2019   Principle Diagnosis:  Iron deficiency anemia Pernicious anemia Gastric bypass with malabsorption Atrial fibrillation - chronic  Current Therapy: IV iron as indicated - last received in November 2018 Vitamin B12 1 mg IM every month - with PCP Eliquis 5 mg p.o. twice daily   Interim History:  [name] is here today with her niece for follow-up. She is doing fairly well. She was hospitalized recently for urosepsis and is currently in [facility] for rehab.  She states that she is slowly feeling better but is fatigued.  Hgb is improved at 11.4, MCV 88.  No fever, chills, n/v, cough, rash, dizziness, SOB, chest pain, palpitations, abdominal pain or changes in bowel habits.  No episode of bleeding noted. She does bruise easily on Eliquis.  The swelling in her lower extremities appears to be controlled. Pedal pulses are 2+.  Neuropathy in hands and feet is stable.  No falls or syncopal episodes to report.  She has maintained a good appetite but the food where she is currently staying is not very good. She is doing her best to stay well hydrated. She was in a wheelchair today and did not feel like standing for a weight.   ECOG Performance Status: 1 - Symptomatic but completely ambulatory  Medications:  Allergies as of 10/31/2019      Reactions   Other Anaphylaxis   Reaction to tree nuts   Amoxicillin-pot Clavulanate Other (See Comments)   headache   Ciprofloxacin Nausea Only, Rash   Food Hives, Other (See Comments)   Potato   Keflex [cephalexin] Rash   Penicillins Rash   Headache. And hives - Dose not tolerate Cephalosporin either   Has patient had a PCN reaction causing immediate rash, facial/tongue/throat swelling, SOB or lightheadedness with hypotension: No Has patient had a PCN reaction causing severe rash involving mucus membranes or skin necrosis: No Has patient had  a PCN reaction that required hospitalization: No Has patient had a PCN reaction occurring within the last 10 years: Yes (reaction was May 2020) If all of the above answers are "NO", then may proce   Rocephin [ceftriaxone] Rash   Tomato Hives      Medication List       Accurate as of October 31, 2019  9:31 AM. If you have any questions, ask your nurse or doctor.        acetaminophen 325 MG tablet Commonly known as: TYLENOL Take 2 tablets (650 mg total) by mouth every 6 (six) hours as needed for mild pain or moderate pain.   albuterol 108 (90 Base) MCG/ACT inhaler Commonly known as: VENTOLIN HFA Inhale 2 puffs into the lungs every 6 (six) hours as needed for wheezing or shortness of breath.   albuterol (2.5 MG/3ML) 0.083% nebulizer solution Commonly known as: PROVENTIL Take 3 mLs (2.5 mg total) by nebulization every 6 (six) hours as needed for wheezing or shortness of breath.   apixaban 5 MG Tabs tablet Commonly known as: Eliquis Take 1 tablet (5 mg total) by mouth 2 (two) times daily.   calcitRIOL 0.5 MCG capsule Commonly known as: ROCALTROL Take 2 mcg by mouth daily.   Copper Caps 2 MG Caps Generic drug: Copper Gluconate Take 1 tablet by mouth daily.   CRANBERRY CONCENTRATE PO Take 30,000 mg by mouth daily.   cyanocobalamin 1000 MCG/ML injection Commonly known as: (VITAMIN B-12) ADMINISTER 1ML (1,000 MCG) UNDER THE SKIN EVERY  30 DAYS What changed: See the new instructions.   desoximetasone 0.05 % cream Commonly known as: TOPICORT Apply topically 2 (two) times daily as needed. What changed:   how much to take  reasons to take this   diclofenac sodium 1 % Gel Commonly known as: VOLTAREN Apply 2 g topically 4 (four) times daily. What changed:   when to take this  reasons to take this   diltiazem 120 MG 24 hr capsule Commonly known as: CARDIZEM CD Take 1 capsule (120 mg total) by mouth daily.   EpiPen 2-Pak 0.3 mg/0.3 mL So[place] injection Generic drug:  EPINEPHrine Inject 0.3 mg into the muscle once as needed for anaphylaxis (severe allergic reaction).   furosemide 40 MG tablet Commonly known as: LASIX Take 1 tablet (40 mg total) by mouth 2 (two) times daily. What changed: how much to take   Gas-X 80 MG chewable tablet Generic drug: simethicone Chew 1 tablet by mouth 4 (four) times daily as needed for flatulence.   hydrocortisone cream 1 % Apply topically 4 (four) times daily as needed for itching.   lactulose 10 GM/15ML solution Commonly known as: CHRONULAC Take 30 mLs (20 g total) by mouth 3 (three) times daily. Titrate to 3 soft bowel movements daily.   levocetirizine 5 MG tablet Commonly known as: XYZAL TAKE 1 TABLET BY MOUTH  EVERY EVENING What changed: when to take this   levothyroxine 100 MCG tablet Commonly known as: SYNTHROID Take 400 mcg by mouth daily before breakfast.   melatonin 5 MG Tabs Take 5 mg by mouth at bedtime.   Myrbetriq 50 MG Tb24 tablet Generic drug: mirabegron ER Take 50 mg by mouth daily.   nystatin powder Commonly known as: MYCOSTATIN/NYSTOP Apply topically 2 (two) times daily. What changed:   how much to take  when to take this  reasons to take this   nystatin cream Commonly known as: MYCOSTATIN Apply 1 application topically 2 (two) times daily. What changed: Another medication with the same name was changed. Make sure you understand how and when to take each.   omeprazole 40 MG capsule Commonly known as: PRILOSEC Take 1 capsule (40 mg total) by mouth in the morning and at bedtime.   ondansetron 4 MG tablet Commonly known as: Zofran Take 1 tablet (4 mg total) by mouth every 8 (eight) hours as needed for nausea or vomiting.   senna-docusate 8.6-50 MG tablet Commonly known as: Senokot-S Take 1 tablet by mouth at bedtime as needed for mild constipation.   spironolactone 25 MG tablet Commonly known as: Aldactone Take 1 tablet (25 mg total) by mouth daily.     sulfamethoxazole-trimethoprim 800-160 MG tablet Commonly known as: BACTRIM DS Take 1 tablet by mouth 2 (two) times daily.   Symbicort 160-4.5 MCG/ACT inhaler Generic drug: budesonide-formoterol INHALE 2 PUFFS BY MOUTH TWO TIMES DAILY What changed: See the new instructions.   SYRINGE 3CC/20GX1" 20G X 1" 3 ML Misc Use monthly as directed What changed:   how much to take  how to take this  when to take this  additional instructions   traMADol 50 MG tablet Commonly known as: ULTRAM Take 1 tablet (50 mg total) by mouth every 12 (twelve) hours as needed for moderate pain. TAKE 1 TABLET(50 MG) BY MOUTH EVERY 12 HOURS AS NEEDED   Tubersol 5 UNIT/0.1ML injection Generic drug: tuberculin Inject 0.1 mLs into the skin once.   Vitamin D (Ergocalciferol) 1.25 MG (50000 UNIT) Caps capsule Commonly known as: DRISDOL TAKE 1  CAPSULE BY MOUTH  EVERY MONDAY, [place],  AND FRIDAY. What changed:   how much to take  how to take this  when to take this   zafirlukast 20 MG tablet Commonly known as: ACCOLATE Take 1 tablet (20 mg total) by mouth 2 (two) times daily before a meal.       Allergies:  Allergies  Allergen Reactions  . Other Anaphylaxis    Reaction to tree nuts  . Amoxicillin-Pot Clavulanate Other (See Comments)    headache  . Ciprofloxacin Nausea Only and Rash  . Food Hives and Other (See Comments)    Potato  . Keflex [Cephalexin] Rash  . Penicillins Rash    Headache. And hives - Dose not tolerate Cephalosporin either   Has patient had a PCN reaction causing immediate rash, facial/tongue/throat swelling, SOB or lightheadedness with hypotension: No Has patient had a PCN reaction causing severe rash involving mucus membranes or skin necrosis: No Has patient had a PCN reaction that required hospitalization: No Has patient had a PCN reaction occurring within the last 10 years: Yes (reaction was May 2020) If all of the above answers are "NO", then may proce  .  Rocephin [Ceftriaxone] Rash  . Tomato Hives    Past Medical History, Surgical history, Social history, and Family History were reviewed and updated.  Review of Systems: All other 10 point review of systems is negative.   Physical Exam:  vitals were not taken for this visit.   Wt Readings from Last 3 Encounters:  10/15/19 244 lb 11.4 oz (111 kg)  09/23/19 238 lb (108 kg)  08/29/19 248 lb (112.5 kg)    Ocular: Sclerae unicteric, pupils equal, round and reactive to light Ear-nose-throat: Oropharynx clear, dentition fair Lymphatic: No cervical or supraclavicular adenopathy Lungs no rales or rhonchi, good excursion bilaterally Heart regular rate and rhythm, no murmur appreciated Abd soft, nontender, positive bowel sounds, no liver or spleen tip palpated on exam, no fluid wave  MSK no focal spinal tenderness, no joint edema Neuro: non-focal, well-oriented, appropriate affect Breasts: Deferred   Lab Results  Component Value Date   WBC 9.0 10/31/2019   HGB 11.4 (L) 10/31/2019   HCT 38.0 10/31/2019   MCV 88.6 10/31/2019   PLT 476 (H) 10/31/2019   Lab Results  Component Value Date   FERRITIN 2,328 (H) 10/15/2019   IRON 35 10/15/2019   TIBC 244 (L) 10/15/2019   UIBC 209 10/15/2019   IRONPCTSAT 14 10/15/2019   Lab Results  Component Value Date   RETICCTPCT 3.5 (H) 10/31/2019   RBC 4.22 10/31/2019   RETICCTABS 131.4 03/30/2015   No results found for: Nils Pyle [facility] Lab Results  Component Value Date   IGGSERUM 1250 06/09/2007   IGA 334 06/09/2007   IGMSERUM 244 06/09/2007   Lab Results  Component Value Date   TOTALPROTELP 7.0 06/09/2007     Chemistry      Component Value Date/Time   NA 134 (L) 10/31/2019 0853   NA 144 03/30/2017 1455   NA 139 07/30/2015 1302   K 3.4 (L) 10/31/2019 0853   K 3.7 03/30/2017 1455   K 3.5 07/30/2015 1302   CL 95 (L) 10/31/2019 0853   CL 99 03/30/2017 1455   CO2 28 10/31/2019 0853   CO2 30 03/30/2017 1455    CO2 25 07/30/2015 1302   BUN 22 10/31/2019 0853   BUN 14 03/30/2017 1455   BUN 16.8 07/30/2015 1302   CREATININE 1.62 (H) 10/31/2019 1610  CREATININE 1.17 (H) 04/20/2019 1538   CREATININE 0.8 07/30/2015 1302      Component Value Date/Time   CALCIUM 10.7 (H) 10/31/2019 0853   CALCIUM 8.7 03/30/2017 1455   CALCIUM 8.8 07/30/2015 1302   ALKPHOS 164 (H) 10/31/2019 0853   ALKPHOS 107 (H) 03/30/2017 1455   ALKPHOS 99 07/30/2015 1302   AST 25 10/31/2019 0853   AST 36 (H) 07/30/2015 1302   ALT 22 10/31/2019 0853   ALT 28 03/30/2017 1455   ALT 68 (H) 07/30/2015 1302   BILITOT 0.5 10/31/2019 0853   BILITOT 0.41 07/30/2015 1302       Impression and Plan: Ms. Stachnik is a very pleasant 64 yo caucasian female with iron deficiency anemia secondary to malabsorption after gastric bypass as well as pernicious anemia.  Iron studies are pending. We will bring her back in for infusion if needed.  We will go ahead and plan to see her back in another 2 months.  She will contact our office with any questions or concerns. We can certainly see her sooner if needed.   Laverna Peace, NP 6/7/20219:31 AM

## 2019-11-01 LAB — ERYTHROPOIETIN: Erythropoietin: 34.8 m[IU]/mL — ABNORMAL HIGH (ref 2.6–18.5)

## 2019-11-02 ENCOUNTER — Other Ambulatory Visit: Payer: Self-pay

## 2019-11-02 ENCOUNTER — Encounter: Payer: Self-pay | Admitting: Orthopaedic Surgery

## 2019-11-02 ENCOUNTER — Ambulatory Visit (INDEPENDENT_AMBULATORY_CARE_PROVIDER_SITE_OTHER): Payer: 59 | Admitting: Physician Assistant

## 2019-11-02 VITALS — Wt 230.0 lb

## 2019-11-02 DIAGNOSIS — M1711 Unilateral primary osteoarthritis, right knee: Secondary | ICD-10-CM

## 2019-11-02 MED ORDER — LIDOCAINE HCL 1 % IJ SOLN
3.0000 mL | INTRAMUSCULAR | Status: AC | PRN
  Administered 2019-11-02: 3 mL

## 2019-11-02 MED ORDER — METHYLPREDNISOLONE ACETATE 40 MG/ML IJ SUSP
40.0000 mg | INTRAMUSCULAR | Status: AC | PRN
  Administered 2019-11-02: 40 mg via INTRA_ARTICULAR

## 2019-11-02 NOTE — Progress Notes (Signed)
Office Visit Note   Patient: [name]           Date of Birth: May 05, 1956           MRN: [id number] Visit Date: 11/02/2019              Requested by: Debbrah Alar, NP [address] [place],  [place] [address] PCP: Debbrah Alar, NP   Assessment & Plan: Visit Diagnoses:  1. Primary osteoarthritis of right knee     Plan: She can continue Voltaren gel to the knee.  Follow-up with [place] as needed.  Would not recommend repeat cortisone injection right knee for 3 months.  Follow-Up Instructions: No follow-ups on file.   Orders:  Orders Placed This Encounter  Procedures  . Large Joint Inj: R knee   No orders of the defined types were placed in this encounter.     Procedures: Large Joint Inj: R knee on 11/02/2019 12:17 PM Indications: pain Details: 22 G 1.5 in needle, anterolateral approach  Arthrogram: No  Medications: 3 mL lidocaine 1 %; 40 mg methylPREDNISolone acetate 40 MG/ML Outcome: tolerated well, no immediate complications Procedure, treatment alternatives, risks and benefits explained, specific risks discussed. Consent was given by the patient. Immediately prior to procedure a time out was called to verify the correct patient, procedure, equipment, support staff and site/side marked as required. Patient was prepped and draped in the usual sterile fashion.       Clinical Data: No additional findings.   Subjective: Chief Complaint  Patient presents with  . Right Knee - Pain    HPI Patient is 64 year old female were seen for the first time for right knee pain.  No known injury.  Pains been ongoing for the past 2 weeks.  She states knee hurts constantly.  Nothing makes it better or worse.  She does note some giving way otherwise no mechanical symptoms.  She notes her swelling about the knee.  She states that her knee pain does awaken her at night.  She states she has been walking more however she presents today in a wheelchair.  When  asked when she last walked on the knee she states this morning.  She reports that she has been using Voltaren gel on the knee.  Patient is nondiabetic.  Denies any prior surgery to the knee. Radiographs right knee dated 10/14/2019 showed no acute fractures no dislocations.  Mild medial lateral compartmental arthritic changes.  Moderate patellofemoral changes lateral film is slightly oblique.  Review of Systems No fevers, chills or known infections.   Objective: Vital Signs: Wt 230 lb (104.3 kg)   LMP 06/11/2012   BMI 36.02 kg/m   Physical Exam Constitutional:      Appearance: She is not ill-appearing or diaphoretic.  Pulmonary:     Effort: Pulmonary effort is normal.  Psychiatric:        Mood and Affect: Affect is blunt.     Ortho Exam She has difficulty getting on and off the exam table and requires assistance.  Right knee no effusion, abnormal warmth erythema or ecchymosis.  No instability valgus varus stressing.  Range of motion causes considerable discomfort. Specialty Comments:  No specialty comments available.  Imaging: No results found.   PMFS History: Patient Active Problem List   Diagnosis Date Noted  . Knee pain   . Hypothermia 10/15/2019  . Thrombocytopenia ([place]) 10/15/2019  . AKI (acute kidney injury) ([place]) 10/15/2019  . Mild neurocognitive disorder 06/28/2019  .  Major depressive disorder   . Generalized anxiety disorder   . Chronic anticoagulation 12/16/2018  . Abnormal nuclear stress test 12/16/2018  . Debilitated patient 12/16/2018  . Pressure injury of skin 12/13/2018  . UTI (urinary tract infection) 12/13/2018  . Pyelonephritis 11/21/2018  . Sepsis due to Escherichia coli (E. coli) ([place]) 11/28/2017  . E coli bacteremia 11/28/2017  . Sepsis ([place]) 11/24/2017  . Hypokalemia 12/10/2016  . Hypocalcemia 12/10/2016  . Recurrent falls 12/09/2016  . Right ureteral stone 07/17/2014  . Osteomyelitis ([place]) 06/14/2014  . Pernicious anemia 02/20/2014  .  Reflux esophagitis 02/02/2014  . Edema 11/23/2013  . Foot pain 03/31/2013  . Endometrial adenocarcinoma ([place]) 02/21/2013  . Trochanteric bursitis of left hip 09/13/2012  . Muscle cramps 08/12/2012  . Fatty liver 01/07/2012  . Mid back pain 09/30/2011  . Weight gain 07/02/2011  . Nonspecific abnormal unspecified cardiovascular function study 04/30/2011  . Hx of papillary thyroid carcinoma 04/07/2011  . Low back pain 04/01/2011  . PAF (paroxysmal atrial fibrillation) 02/05/2011  . Hypoparathyroidism ([place]) 01/07/2011  . GERD (gastroesophageal reflux disease) 05/22/2009  . Leukocytosis 03/24/2009  . Morbid obesity ([place]) 12/20/2008  . Rhinosinusitis, recurrent 09/07/2008  . Vitamin D deficiency 05/10/2007  . Iron deficiency anemia secondary to malabsorption  05/10/2007  . Anemia 05/10/2007  . Hypothyroidism 01/05/2007  . Essential hypertension 01/05/2007  . Asthma 01/05/2007   Past Medical History:  Diagnosis Date  . Abnormal Pap smear    years ago/no biopsy  . Allergic rhinitis   . Aortic atherosclerosis   . Arthritis    back- lower  . Asthma   . Atrial fibrillation 12/2010   OFF XARELTO LAST MONTH DUE TO BLEEDING IN STOOL  . Bursitis of hip left  . Chronic anticoagulation 12/16/2018   CHADS VASC=2 for sex and H/O HTN- she is on Eliquis  . Chronic diastolic (congestive) heart failure    pt unaware of this  . COPD (chronic obstructive pulmonary disease) with chronic bronchitis   . Decreased pulses in feet   . Dysrhythmia    afib, followed by Dr. Stanford Breed   . Edema of both legs   . Endometrial adenocarcinoma 02/21/2013   S/p D and C, has mirena, being followed by GYN   . Esophagitis 02/02/2014   Distal, linear erosions, noted on endoscopy  . Essential hypertension 01/05/2007   Echo Nov June 2020- EF 50-55%, mild LVH, normal LA   . Fatty liver 01/07/2012  . Fibroid 1974   fibroid cyst on left fallopian tube  . Fibromyalgia   . Foot ulcer    AREA HEALED RIGHT FOOT  .  Generalized anxiety disorder   . GERD (gastroesophageal reflux disease)   . Headache    occasional sinus headache   . Hepatomegaly   . History of blood transfusion JULY 2015  . History of cardiomegaly 12/06/2010   Noted on CT  . History of E. coli septicemia   . History of papillary thyroid carcinoma 04/07/2011  . Hypocalcemia 12/10/2016  . Hypokalemia 12/10/2016  . Hypoparathyroidism 01/07/2011  . Hypothyroidism   . Iron deficiency anemia secondary to malabsorption  05/10/2007   Qualifier: Diagnosis of  By: Wynona Luna    . Kidney stone 09-25-2015   passed on their own  . Leukocytosis 03/24/2009   cta cheQualifier: Diagnosis of  By: Wynona Luna    . Major depressive disorder   . Mild neurocognitive disorder 06/28/2019  . Morbid obesity   . MRSA infection   .  Nephrolithiasis 2/16, 9/16   SEES DR Risa Grill  . Osteomyelitis 06/14/2014  . PAF (paroxysmal atrial fibrillation) 02/05/2011   Documented 2012- NSR since.  On chronic anticoagulation.  . Pernicious anemia 02/20/2014   followed by Debbrah Alar  . Pneumonia   . PONV (postoperative nausea and vomiting)   . Pyelonephritis 11/24/2018  . Rash    thighs and back  . Seizures    infancy secondary to fever  . Staph aureus infection   . Thoracic spondylosis 12/06/2010   Noted on CT  . Uterine cancer 2014   Mirena IUD  . UTI (urinary tract infection) 12/13/2018  . Vitamin B 12 deficiency   . Vitamin D deficiency 05/10/2007   Qualifier: Diagnosis of  By: Wynona Luna     Family History  Problem Relation Age of Onset  . Hypertension Father   . Diabetes Father   . Lung cancer Father   . Heart attack Father        MI at age 43  . Hypertension Mother   . Hyperthyroidism Mother   . Heart disease Mother   . Heart attack Mother        MI at age 48  . Asthma Brother   . Hypertension Brother        younger  . Heart disease Brother        older  . Dementia Paternal Aunt        Unspecified type; symptoms  emerged with advanced age  . Colon cancer Neg Hx   . Esophageal cancer Neg Hx   . Stomach cancer Neg Hx   . Kidney disease Neg Hx   . Liver disease Neg Hx   . Pancreatic cancer Neg Hx     Past Surgical History:  Procedure Laterality Date  . AMPUTATION Right 05/18/2014   Procedure: RIGHT FIFTH RAY AMPUTATION FOOT;  Surgeon: Wylene Simmer, MD;  Location: [place] [facility];  Service: Orthopedics;  Laterality: Right;  . APPENDECTOMY    . BUNIONECTOMY     bilateral  . COLONOSCOPY    . COLONOSCOPY WITH PROPOFOL N/A 02/02/2014   Procedure: COLONOSCOPY WITH PROPOFOL;  Surgeon: Irene Shipper, MD;  Location: [facility];  Service: Endoscopy;  Laterality: N/A;  . cyst on ovary removed     . CYSTOSCOPY W/ RETROGRADES  03/03/2012   Procedure: CYSTOSCOPY WITH RETROGRADE PYELOGRAM;  Surgeon: Bernestine Amass, MD;  Location: [facility];  Service: Urology;  Laterality: Bilateral;  . CYSTOSCOPY W/ URETERAL STENT PLACEMENT Right 11/25/2017   Procedure: CYSTOSCOPY WITH RETROGRADE RIGHT URETERAL STENT PLACEMENT;  Surgeon: Franchot Gallo, MD;  Location: [facility];  Service: Urology;  Laterality: Right;  . CYSTOSCOPY W/ URETERAL STENT PLACEMENT Right 01/15/2018   Procedure: RIGHT STENT EXCHANGE;  Surgeon: Ardis Hughs, MD;  Location: [facility];  Service: Urology;  Laterality: Right;  . CYSTOSCOPY WITH RETROGRADE PYELOGRAM, URETEROSCOPY AND STENT PLACEMENT Left 08/25/2012   Procedure: CYSTOSCOPY WITH RETROGRADE PYELOGRAM, URETEROSCOPY;  Surgeon: Bernestine Amass, MD;  Location: [facility];  Service: Urology;  Laterality: Left;  . CYSTOSCOPY WITH STENT PLACEMENT Left 01/15/2018   Procedure: LEFT STENT PLACEMENT;  Surgeon: Ardis Hughs, MD;  Location: [facility];  Service: Urology;  Laterality: Left;  . CYSTOSCOPY WITH STENT PLACEMENT Right 01/22/2018   Procedure: RIGHT STENT REMOVAL;  Surgeon: Ardis Hughs, MD;  Location[facility];  Service: Urology;  Laterality: Right;  . CYSTOSCOPY/URETEROSCOPY/HOLMIUM LASER/STENT PLACEMENT Right  12/23/2017   Procedure: RIGHT URETEROSCOPY STONE REMOVAL, HOLMIUM  LASER, RIGHT /STENT EXCHANGE;  Surgeon: Ardis Hughs, MD;  [facility];  Service: Urology;  Laterality: Right;  . CYSTOSCOPY/URETEROSCOPY/HOLMIUM LASER/STENT PLACEMENT Left 01/22/2018   Procedure: LEFT URETEROSCOPY STONE REMOVAL HOLMIUM LASER LEFT STENT Freddi Starr;  Surgeon: Ardis Hughs, MD;  Location: [facility];  Service: Urology;  Laterality: Left;  . DILATION AND CURETTAGE OF UTERUS N/A 02/21/2013   Procedure: DILATATION AND CURETTAGE;  Surgeon: Lyman Speller, MD;  Location: [facility];  Service: Gynecology;  Laterality: N/A;  . DILATION AND CURETTAGE OF UTERUS N/A 04/09/2015   Procedure: [place] IUD removal;  Surgeon: Megan Salon, MD;  Location: [facility];  Service: Gynecology;  Laterality: N/A;  Patient weight 307lbs  . ESOPHAGOGASTRODUODENOSCOPY (EGD) WITH PROPOFOL N/A 02/02/2014   Procedure: ESOPHAGOGASTRODUODENOSCOPY (EGD) WITH PROPOFOL;  Surgeon: Irene Shipper, MD;  [facility];  Service: Endoscopy;  Laterality: N/A;  . GASTRIC BYPASS  1974  . HOLMIUM LASER APPLICATION Left 01/30/2835   Procedure: HOLMIUM LASER APPLICATION;  Surgeon: Bernestine Amass, MD;  [facility];  Service: Urology;  Laterality: Left;  . LITHOTRIPSY  03/2012  . LITHOTRIPSY  2/16  . THYROIDECTOMY  05/15/2010  . TONSILLECTOMY AND ADENOIDECTOMY    . UPPER GASTROINTESTINAL ENDOSCOPY    . URETEROSCOPY  03/03/2012   Procedure: URETEROSCOPY;  Surgeon: Bernestine Amass, MD;  Location[facility];  Service: Urology;  Laterality: Left;  . URETEROSCOPY WITH HOLMIUM LASER LITHOTRIPSY Bilateral 01/15/2018   Procedure: CYSTOSCOPY/BILATERAL URETEROSCOPY WITH HOLMIUM LASER LITHOTRIPSY STONE REMOVAL;  Surgeon: Ardis Hughs, MD;  Location: [facility];  Service: Urology;  Laterality: Bilateral;   Social History   Occupational History  . Occupation: works in Insurance claims handler  . Occupation: DESIGN  COMPUTER CHIP    Employer: [employer]  Tobacco Use  . Smoking status: Former Smoker    Packs/day: 0.50    Years: 25.00    Pack years: 12.50    Types: Cigarettes    Start date: 12/24/1970    Quit date: 05/27/1995    Years since quitting: 24.4  . Smokeless tobacco: Never Used  Substance and Sexual Activity  . Alcohol use: Yes    Alcohol/week: 0.0 standard drinks    Comment: <12 oz per month  . Drug use: No  . Sexual activity: Yes    Partners: Male    Birth control/protection: Post-menopausal

## 2019-11-14 ENCOUNTER — Other Ambulatory Visit: Payer: Self-pay

## 2019-11-14 ENCOUNTER — Encounter: Payer: Self-pay | Admitting: Neurology

## 2019-11-14 ENCOUNTER — Telehealth (INDEPENDENT_AMBULATORY_CARE_PROVIDER_SITE_OTHER): Payer: 59 | Admitting: Neurology

## 2019-11-14 VITALS — Ht 64.0 in | Wt 216.0 lb

## 2019-11-14 DIAGNOSIS — G3184 Mild cognitive impairment, so stated: Secondary | ICD-10-CM | POA: Diagnosis not present

## 2019-11-14 DIAGNOSIS — M4800 Spinal stenosis, site unspecified: Secondary | ICD-10-CM | POA: Diagnosis not present

## 2019-11-14 NOTE — Progress Notes (Signed)
Virtual Visit via Video Note The purpose of this virtual visit is to provide medical care while limiting exposure to the novel coronavirus.    Consent was obtained for video visit:  Yes.   Answered questions that patient had about telehealth interaction:  Yes.   I discussed the limitations, risks, security and privacy concerns of performing an evaluation and management service by telemedicine. I also discussed with the patient that there may be a patient responsible charge related to this service. The patient expressed understanding and agreed to proceed.  Pt location: Home Physician Location: office Name of referring provider:  Debbrah Alar, NP I connected with [name] at patients initiation/request on 11/14/2019 at  2:30 PM EDT by video enabled telemedicine application and verified that I am speaking with the correct person using two identifiers. Pt MRN:  [id number] Pt DOB:  July 02, 1955 Video Participants:  [name];  [name] (brother)   History of Present Illness:  The patient was seen as a virtual video visit on 11/14/2019. She was last seen 5 months ago in the neurology clinic for memory loss. She also reported a 3-4 year history of frequent falls, to the point that she got a wheelchair a year ago. Records and images were reviewed.  Neuropsychological testing done in 06/2019 indicated mild neurocognitive disorder, etiology unclear and likely multifactorial in nature.  She had numerous medical ailments (including fibromyalgia), as well as a lengthy medication list.  She endorsed moderate levels of both anxiety and depression.  There is the potential that a combination of medical, psychiatric, and polypharmacy factors represent the primary etiology for observed cognitive weaknesses.  A baseline underlying neurocognitive disorder based on current test scores could not be ruled, however her profile did not fully align with known conditions at this time. I personally  reviewed MRI brain and cervical spine without contrast done 07/2019 which did not show any acute intracranial changes, there was mild to moderate chronic microvascular disease. MRI C-spine had shown degenerative disc osteophyte at C5-6 with resultant severe spinal stenosis and spinal cord compression.  Associated signal abnormality within the cervical cord most consistent with chronic myelomalacia; additional multilevel cervical spondylosis with resultant mild to moderate diffuse spinal stenosis at C3-4 through C6-7; multifactorial degenerative changes with resultant multilevel foraminal narrowing, notably with moderate right C4 and bilateral C5 foraminal stenosis, with severe left and moderate right C6 foraminal narrowing. I had discussed MRI results with her and discussed that this is likely cause of repeated falls, she was referred to Neurosurgery. She states she had not heard back. She was admitted to the hospital in May 2021 for increasing confusion, generalized weakness, right knee pain after a fall. She was diagnosed with urospesis, she was hypothermic, hypotensive, with anemia and thrombocytopenia. She was treated with antibiotics and was noted to have a copper deficiency and started on supplements. She had intermittent confusion felt secondary to sepsis and delirium. She has been at [facility] for the past 3 weeks. She has a flat affect, her brother mostly answers questions today. She states he memory is not so good. [name] states that she knows her name, DOB, president, but does not know where she is or why she is there, she did not know the days of the week or her ago. He reports her memory is starting to come back, but physically she has not improved. She developed a pressure ulcer and complains of a lot of pain in her buttock/tailbone region. The wound care nurse  has told her it is better, she has an air mattress. She was supposed to be discharged today but they report this is not happening. She states  she is sleeping okay but [name] does not think so, she is in a lot of discomfort.    History on Initial Assessment 06/10/2019: This is a pleasant 64 year old right-handed woman with a history of atrial fibrillation, COPD, hyperparathyroidism, sacral decubitus ulcer, nephrolithiasis, fibromyalgia, presenting for evaluation of memory loss. She feels she cannot communicate very well, with word-finding difficulties. She feels this started after one of her hospitalizations for nephrolithiasis in July 2020. Her brother [name] started noticing changes 3-4 years ago, each time she would have kidney surgery, he noticed a change in cognition. Her niece feels it was even before this. He has noticed forgetfulness, slowed responses. She would forget something she just said, also depending on how tired she is. She lives alone. She denies missing medications, but [name] reports that for the past 2 weeks, different doctors have been changing her medications so she could not remember what she was not supposed to take. All her medications are in a basket, her brother feels it could be more organized. 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 She started having frequent falls a year ago and started working remotely, then [employer] hit. She has noticed she is making more mistakes than usual with her work, not performing like before. Her co-workers started noticing 6 months ago. They report that 3-4 months ago she was getting very angry, stating it takes a lot for her to compartmentalize and not worry. [name] noted that all of a sudden, she returned to her normal personality, he feels it was because of a more positive performance review she got from work this week. She stopped driving 2 years ago. She used to cook all the time, she last cooked Thanksgiving dinner in 2019, but now gives instructions. She denies misplacing things frequently.   She started having frequent falls 3-4 years ago. She  denies any neck/back pain. She has tingling in her fingers, numbness up to her knees for many years. She has burning in the soles of her feet. She bought a wheelchair a year ago due to frequent falls/lack of balance. She has been hoping to get PT but it has not happened. Her brother notes she is more cautious, her confidence is not there. She has headaches trying to read small print. She has sinus headaches regularly. No dizziness, diplopia, dysarthria/dysphagia, bowel/bladder dysfunction, anosmia, or tremors. She usually gest 8-9 hours of refreshing sleep but lately has been drowsy during the day. There are 2 paternal aunts with dementia. She denies any head injuries, but they do not know what happened the last time she fell, she was on the floor for 11 hours before her brother found her. She does not drink alcohol.   Laboratory Data:  Lab Results  Component Value Date   TSH 0.398 10/14/2019   Lab Results  Component Value Date   VITAMINB12 1,499 (H) 10/15/2019     Current Outpatient Medications on File Prior to Visit  Medication Sig Dispense Refill  . acetaminophen (TYLENOL) 325 MG tablet Take 2 tablets (650 mg total) by mouth every 6 (six) hours as needed for mild pain or moderate pain. 60 tablet 0  . albuterol (PROVENTIL HFA;VENTOLIN HFA) 108 (90 Base) MCG/ACT inhaler Inhale 2 puffs into the lungs every 6 (six) hours as needed for wheezing  or shortness of breath. 1 Inhaler 5  . albuterol (PROVENTIL) (2.5 MG/3ML) 0.083% nebulizer solution Take 3 mLs (2.5 mg total) by nebulization every 6 (six) hours as needed for wheezing or shortness of breath. 150 mL 1  . apixaban (ELIQUIS) 5 MG TABS tablet Take 1 tablet (5 mg total) by mouth 2 (two) times daily. 180 tablet 1  . calcitRIOL (ROCALTROL) 0.5 MCG capsule Take 2 mcg by mouth daily.     . Copper Gluconate (COPPER CAPS) 2 MG CAPS Take 1 tablet by mouth daily.    [name] CRANBERRY CONCENTRATE PO Take 30,000 mg by mouth daily.    . cyanocobalamin (,VITAMIN  B-12,) 1000 MCG/ML injection ADMINISTER 1ML (1,000 MCG) UNDER THE SKIN EVERY 30 DAYS (Patient taking differently: Inject 1,000 mcg into the muscle every 30 (thirty) days. ) 3 mL 4  . desoximetasone (TOPICORT) 0.05 % cream Apply topically 2 (two) times daily as needed. (Patient taking differently: Apply 1 application topically 2 (two) times daily as needed (irritation). ) 60 g 0  . diclofenac sodium (VOLTAREN) 1 % GEL Apply 2 g topically 4 (four) times daily. (Patient taking differently: Apply 2 g topically 4 (four) times daily as needed (pain). ) 100 g 3  . EPINEPHrine (EPIPEN 2-PAK) 0.3 mg/0.3 mL IJ SOAJ injection Inject 0.3 mg into the muscle once as needed for anaphylaxis (severe allergic reaction).     . furosemide (LASIX) 40 MG tablet Take 1 tablet (40 mg total) by mouth 2 (two) times daily. (Patient taking differently: Take 20 mg by mouth 2 (two) times daily. ) 270 tablet 3  . hydrocortisone cream 1 % Apply topically 4 (four) times daily as needed for itching. 30 g 0  . lactulose (CHRONULAC) 10 GM/15ML solution Take 30 mLs (20 g total) by mouth 3 (three) times daily. Titrate to 3 soft bowel movements daily. 236 mL 0  . levocetirizine (XYZAL) 5 MG tablet TAKE 1 TABLET BY MOUTH  EVERY EVENING (Patient taking differently: Take 5 mg by mouth every morning. ) 90 tablet 3  . levothyroxine (SYNTHROID, LEVOTHROID) 100 MCG tablet Take 400 mcg by mouth daily before breakfast.     . Melatonin 5 MG TABS Take 5 mg by mouth at bedtime.     [name] MYRBETRIQ 50 MG TB24 tablet Take 50 mg by mouth daily.    [name] nystatin (MYCOSTATIN/NYSTOP) powder Apply topically 2 (two) times daily. (Patient taking differently: Apply 1 application topically 2 (two) times daily as needed (irritation). ) 45 g 1  . nystatin cream (MYCOSTATIN) Apply 1 application topically 2 (two) times daily. 30 g 2  . omeprazole (PRILOSEC) 40 MG capsule Take 1 capsule (40 mg total) by mouth in the morning and at bedtime. 60 capsule 5  . ondansetron  (ZOFRAN) 4 MG tablet Take 1 tablet (4 mg total) by mouth every 8 (eight) hours as needed for nausea or vomiting. 30 tablet 0  . senna-docusate (SENOKOT-S) 8.6-50 MG tablet Take 1 tablet by mouth at bedtime as needed for mild constipation.    . simethicone (GAS-X) 80 MG chewable tablet Chew 1 tablet by mouth 4 (four) times daily as needed for flatulence.     [name] spironolactone (ALDACTONE) 25 MG tablet Take 1 tablet (25 mg total) by mouth daily. 90 tablet 2  . sulfamethoxazole-trimethoprim (BACTRIM DS) 800-160 MG tablet Take 1 tablet by mouth 2 (two) times daily.    . SYMBICORT 160-4.5 MCG/ACT inhaler INHALE 2 PUFFS BY MOUTH TWO TIMES DAILY (Patient taking differently: Inhale  2 puffs into the lungs in the morning and at bedtime. ) 30.6 g 1  . Syringe/Needle, Disp, (SYRINGE 3CC/20GX1") 20G X 1" 3 ML MISC Use monthly as directed (Patient taking differently: 1 each by Other route every 30 (thirty) days. ) 50 each 0  . traMADol (ULTRAM) 50 MG tablet Take 1 tablet (50 mg total) by mouth every 12 (twelve) hours as needed for moderate pain. TAKE 1 TABLET(50 MG) BY MOUTH EVERY 12 HOURS AS NEEDED 10 tablet 0  . tuberculin (TUBERSOL) 5 UNIT/0.1ML injection Inject 0.1 mLs into the skin once.    . Vitamin D, Ergocalciferol, (DRISDOL) 1.25 MG (50000 UNIT) CAPS capsule TAKE 1 CAPSULE BY MOUTH  EVERY MONDAY, WEDNESDAY,  AND FRIDAY. (Patient taking differently: Take 50,000 Units by mouth 3 (three) times a week. TAKE 1 CAPSULE BY MOUTH  EVERY MONDAY, WEDNESDAY,  AND FRIDAY.) 39 capsule 1  . zafirlukast (ACCOLATE) 20 MG tablet Take 1 tablet (20 mg total) by mouth 2 (two) times daily before a meal. 120 tablet 0  . diltiazem (CARDIZEM CD) 120 MG 24 hr capsule Take 1 capsule (120 mg total) by mouth daily. 90 capsule 1   No current facility-administered medications on file prior to visit.     Observations/Objective:   Vitals:   11/14/19 1429  Weight: 216 lb (98 kg)  Height: 5\' 4"  (1.626 m)   GEN:  The patient appears  stated age and is in NAD.  Neurological examination: Patient is awake, alert, oriented to person, year. States it is November 25, 2019 (it is 11/14/19). Reduced fluency with minimal verbal output today, appears depressed with flat affect, poor eye contact. Remote and recent memory impaired. Cranial nerves: Extraocular movements intact with no nystagmus. No facial asymmetry. Motor: she is sitting on the wheelchair, able to lift arms anti-gravity, reports pain on lifting left leg. No incoordination on finger to nose testing   Assessment and Plan:   This is a pleasant 64 yo RH woman with a history of atrial fibrillation, COPD, hyperparathyroidism, sacral decubitus ulcer, nephrolithiasis, fibromyalgia, who presented for evaluation of memory loss. MRI brain unremarkable. Neuropsychological testing indicated Mild Neurocognitive disorder, etiology likely multifactorial. There is a potential that a combination of medical, psychiatric, and polypharmacy factors represent the primary etiology for her observed cognitive weaknesses.  A baseline underlying neurocognitive disorder based on current test scores could not be ruled out, however her profile did not fully align with known conditions at this time.  MRI of her cervical spine showed severe stenosis and spinal cord compression at C5-6 with associated signal abnormality in the cervical cord most consistent with chronic myelomalacia.  This is likely the cause of her frequent falls, I had the send a referral to neurosurgery, however it appears that patient was hospitalized and has not been able to follow-up.  I discussed the need for neurosurgical follow-up, however at this time she is dealing with other medical issues as she recovers in rehab.We agreed to continue current care at this time and plan for Neurosurgical evaluation when able. Follow-up with me in 6 months, they know to call for any changes.    Follow Up Instructions:   -I discussed the assessment and  treatment plan with the patient. The patient was provided an opportunity to ask questions and all were answered. The patient agreed with the plan and demonstrated an understanding of the instructions.   The patient was advised to call back or seek an in-person evaluation if the symptoms worsen or if  the condition fails to improve as anticipated.    Cameron Sprang, MD

## 2019-11-25 ENCOUNTER — Encounter: Payer: Self-pay | Admitting: Neurology

## 2019-12-12 ENCOUNTER — Ambulatory Visit: Payer: 59 | Admitting: Podiatry

## 2019-12-15 ENCOUNTER — Other Ambulatory Visit: Payer: Self-pay | Admitting: Nurse Practitioner

## 2019-12-15 DIAGNOSIS — N63 Unspecified lump in unspecified breast: Secondary | ICD-10-CM

## 2019-12-16 ENCOUNTER — Other Ambulatory Visit: Payer: Self-pay | Admitting: Nurse Practitioner

## 2019-12-20 ENCOUNTER — Emergency Department (HOSPITAL_COMMUNITY): Payer: 59

## 2019-12-20 ENCOUNTER — Encounter (HOSPITAL_COMMUNITY): Payer: Self-pay

## 2019-12-20 ENCOUNTER — Inpatient Hospital Stay (HOSPITAL_COMMUNITY)
Admission: EM | Admit: 2019-12-20 | Discharge: 2019-12-27 | DRG: 871 | Disposition: A | Discharge status: Hospice | Payer: 59 | Source: Skilled Nursing Facility | Attending: Internal Medicine | Admitting: Internal Medicine

## 2019-12-20 DIAGNOSIS — Z85850 Personal history of malignant neoplasm of thyroid: Secondary | ICD-10-CM

## 2019-12-20 DIAGNOSIS — N179 Acute kidney failure, unspecified: Secondary | ICD-10-CM | POA: Diagnosis present

## 2019-12-20 DIAGNOSIS — D509 Iron deficiency anemia, unspecified: Secondary | ICD-10-CM | POA: Diagnosis present

## 2019-12-20 DIAGNOSIS — Z881 Allergy status to other antibiotic agents status: Secondary | ICD-10-CM

## 2019-12-20 DIAGNOSIS — R609 Edema, unspecified: Secondary | ICD-10-CM | POA: Diagnosis not present

## 2019-12-20 DIAGNOSIS — A4151 Sepsis due to Escherichia coli [E. coli]: Secondary | ICD-10-CM | POA: Diagnosis not present

## 2019-12-20 DIAGNOSIS — I959 Hypotension, unspecified: Secondary | ICD-10-CM

## 2019-12-20 DIAGNOSIS — M797 Fibromyalgia: Secondary | ICD-10-CM | POA: Diagnosis present

## 2019-12-20 DIAGNOSIS — Z20822 Contact with and (suspected) exposure to covid-19: Secondary | ICD-10-CM | POA: Diagnosis present

## 2019-12-20 DIAGNOSIS — Z6841 Body Mass Index (BMI) 40.0 and over, adult: Secondary | ICD-10-CM

## 2019-12-20 DIAGNOSIS — G92 Toxic encephalopathy: Secondary | ICD-10-CM | POA: Diagnosis present

## 2019-12-20 DIAGNOSIS — J449 Chronic obstructive pulmonary disease, unspecified: Secondary | ICD-10-CM | POA: Diagnosis present

## 2019-12-20 DIAGNOSIS — A419 Sepsis, unspecified organism: Secondary | ICD-10-CM | POA: Diagnosis present

## 2019-12-20 DIAGNOSIS — Z66 Do not resuscitate: Secondary | ICD-10-CM | POA: Diagnosis present

## 2019-12-20 DIAGNOSIS — Z8614 Personal history of Methicillin resistant Staphylococcus aureus infection: Secondary | ICD-10-CM

## 2019-12-20 DIAGNOSIS — I5032 Chronic diastolic (congestive) heart failure: Secondary | ICD-10-CM | POA: Diagnosis present

## 2019-12-20 DIAGNOSIS — Z515 Encounter for palliative care: Secondary | ICD-10-CM | POA: Diagnosis not present

## 2019-12-20 DIAGNOSIS — Z880 Allergy status to penicillin: Secondary | ICD-10-CM

## 2019-12-20 DIAGNOSIS — Z7401 Bed confinement status: Secondary | ICD-10-CM

## 2019-12-20 DIAGNOSIS — Z8249 Family history of ischemic heart disease and other diseases of the circulatory system: Secondary | ICD-10-CM

## 2019-12-20 DIAGNOSIS — E559 Vitamin D deficiency, unspecified: Secondary | ICD-10-CM | POA: Diagnosis present

## 2019-12-20 DIAGNOSIS — Z8542 Personal history of malignant neoplasm of other parts of uterus: Secondary | ICD-10-CM

## 2019-12-20 DIAGNOSIS — R627 Adult failure to thrive: Secondary | ICD-10-CM | POA: Diagnosis not present

## 2019-12-20 DIAGNOSIS — R6521 Severe sepsis with septic shock: Secondary | ICD-10-CM | POA: Diagnosis present

## 2019-12-20 DIAGNOSIS — I110 Hypertensive heart disease with heart failure: Secondary | ICD-10-CM | POA: Diagnosis present

## 2019-12-20 DIAGNOSIS — Z978 Presence of other specified devices: Secondary | ICD-10-CM

## 2019-12-20 DIAGNOSIS — Z87440 Personal history of urinary (tract) infections: Secondary | ICD-10-CM

## 2019-12-20 DIAGNOSIS — R296 Repeated falls: Secondary | ICD-10-CM | POA: Diagnosis present

## 2019-12-20 DIAGNOSIS — N200 Calculus of kidney: Secondary | ICD-10-CM | POA: Diagnosis present

## 2019-12-20 DIAGNOSIS — E440 Moderate protein-calorie malnutrition: Secondary | ICD-10-CM | POA: Diagnosis present

## 2019-12-20 DIAGNOSIS — Z825 Family history of asthma and other chronic lower respiratory diseases: Secondary | ICD-10-CM

## 2019-12-20 DIAGNOSIS — L89150 Pressure ulcer of sacral region, unstageable: Secondary | ICD-10-CM | POA: Diagnosis present

## 2019-12-20 DIAGNOSIS — E875 Hyperkalemia: Secondary | ICD-10-CM | POA: Diagnosis present

## 2019-12-20 DIAGNOSIS — E162 Hypoglycemia, unspecified: Secondary | ICD-10-CM | POA: Diagnosis not present

## 2019-12-20 DIAGNOSIS — F411 Generalized anxiety disorder: Secondary | ICD-10-CM | POA: Diagnosis present

## 2019-12-20 DIAGNOSIS — B377 Candidal sepsis: Secondary | ICD-10-CM | POA: Diagnosis present

## 2019-12-20 DIAGNOSIS — Z801 Family history of malignant neoplasm of trachea, bronchus and lung: Secondary | ICD-10-CM

## 2019-12-20 DIAGNOSIS — D689 Coagulation defect, unspecified: Secondary | ICD-10-CM | POA: Diagnosis present

## 2019-12-20 DIAGNOSIS — K219 Gastro-esophageal reflux disease without esophagitis: Secondary | ICD-10-CM | POA: Diagnosis present

## 2019-12-20 DIAGNOSIS — B3749 Other urogenital candidiasis: Secondary | ICD-10-CM | POA: Diagnosis present

## 2019-12-20 DIAGNOSIS — Z87891 Personal history of nicotine dependence: Secondary | ICD-10-CM

## 2019-12-20 DIAGNOSIS — R5381 Other malaise: Secondary | ICD-10-CM | POA: Diagnosis present

## 2019-12-20 DIAGNOSIS — Z833 Family history of diabetes mellitus: Secondary | ICD-10-CM

## 2019-12-20 DIAGNOSIS — G3184 Mild cognitive impairment, so stated: Secondary | ICD-10-CM | POA: Diagnosis present

## 2019-12-20 DIAGNOSIS — R197 Diarrhea, unspecified: Secondary | ICD-10-CM | POA: Diagnosis present

## 2019-12-20 DIAGNOSIS — E039 Hypothyroidism, unspecified: Secondary | ICD-10-CM | POA: Diagnosis present

## 2019-12-20 DIAGNOSIS — D6489 Other specified anemias: Secondary | ICD-10-CM | POA: Diagnosis present

## 2019-12-20 DIAGNOSIS — I480 Paroxysmal atrial fibrillation: Secondary | ICD-10-CM | POA: Diagnosis present

## 2019-12-20 DIAGNOSIS — D649 Anemia, unspecified: Secondary | ICD-10-CM | POA: Diagnosis present

## 2019-12-20 DIAGNOSIS — E872 Acidosis: Secondary | ICD-10-CM | POA: Diagnosis present

## 2019-12-20 DIAGNOSIS — E209 Hypoparathyroidism, unspecified: Secondary | ICD-10-CM | POA: Diagnosis present

## 2019-12-20 DIAGNOSIS — D696 Thrombocytopenia, unspecified: Secondary | ICD-10-CM | POA: Diagnosis present

## 2019-12-20 DIAGNOSIS — Z7189 Other specified counseling: Secondary | ICD-10-CM

## 2019-12-20 DIAGNOSIS — Z7901 Long term (current) use of anticoagulants: Secondary | ICD-10-CM

## 2019-12-20 LAB — COMPREHENSIVE METABOLIC PANEL
ALT: 91 U/L — ABNORMAL HIGH (ref 0–44)
AST: 113 U/L — ABNORMAL HIGH (ref 15–41)
Albumin: 1.4 g/dL — ABNORMAL LOW (ref 3.5–5.0)
Alkaline Phosphatase: 250 U/L — ABNORMAL HIGH (ref 38–126)
Anion gap: 14 (ref 5–15)
BUN: 59 mg/dL — ABNORMAL HIGH (ref 8–23)
CO2: 20 mmol/L — ABNORMAL LOW (ref 22–32)
Calcium: 7.6 mg/dL — ABNORMAL LOW (ref 8.9–10.3)
Chloride: 101 mmol/L (ref 98–111)
Creatinine, Ser: 3.36 mg/dL — ABNORMAL HIGH (ref 0.44–1.00)
GFR calc Af Amer: 16 mL/min — ABNORMAL LOW (ref >60)
GFR calc non Af Amer: 14 mL/min — ABNORMAL LOW (ref >60)
Glucose, Bld: 59 mg/dL — ABNORMAL LOW (ref 70–99)
Potassium: 5.3 mmol/L — ABNORMAL HIGH (ref 3.5–5.1)
Sodium: 135 mmol/L (ref 135–145)
Total Bilirubin: 1 mg/dL (ref 0.3–1.2)
Total Protein: 4.8 g/dL — ABNORMAL LOW (ref 6.5–8.1)

## 2019-12-20 LAB — CBC WITH DIFFERENTIAL/PLATELET
Abs Immature Granulocytes: 0.05 10*3/uL (ref 0.00–0.07)
Basophils Absolute: 0 10*3/uL (ref 0.0–0.1)
Basophils Relative: 0 %
Eosinophils Absolute: 0 10*3/uL (ref 0.0–0.5)
Eosinophils Relative: 0 %
HCT: 26.3 % — ABNORMAL LOW (ref 36.0–46.0)
Hemoglobin: 8 g/dL — ABNORMAL LOW (ref 12.0–15.0)
Immature Granulocytes: 1 %
Lymphocytes Relative: 5 %
Lymphs Abs: 0.3 10*3/uL — ABNORMAL LOW (ref 0.7–4.0)
MCH: 28.4 pg (ref 26.0–34.0)
MCHC: 30.4 g/dL (ref 30.0–36.0)
MCV: 93.3 fL (ref 80.0–100.0)
Monocytes Absolute: 0.2 10*3/uL (ref 0.1–1.0)
Monocytes Relative: 4 %
Neutro Abs: 5.9 10*3/uL (ref 1.7–7.7)
Neutrophils Relative %: 90 %
Platelets: DECREASED 10*3/uL (ref 150–400)
RBC: 2.82 MIL/uL — ABNORMAL LOW (ref 3.87–5.11)
RDW: 22.5 % — ABNORMAL HIGH (ref 11.5–15.5)
WBC: 6.5 10*3/uL (ref 4.0–10.5)
nRBC: 0.3 % — ABNORMAL HIGH (ref 0.0–0.2)

## 2019-12-20 LAB — URINALYSIS, ROUTINE W REFLEX MICROSCOPIC

## 2019-12-20 LAB — CBC
HCT: 22.5 % — ABNORMAL LOW (ref 36.0–46.0)
Hemoglobin: 6.7 g/dL — CL (ref 12.0–15.0)
MCH: 27.7 pg (ref 26.0–34.0)
MCHC: 29.8 g/dL — ABNORMAL LOW (ref 30.0–36.0)
MCV: 93 fL (ref 80.0–100.0)
Platelets: 25 10*3/uL — CL (ref 150–400)
RBC: 2.42 MIL/uL — ABNORMAL LOW (ref 3.87–5.11)
RDW: 22.5 % — ABNORMAL HIGH (ref 11.5–15.5)
WBC: 6.6 10*3/uL (ref 4.0–10.5)
nRBC: 0.5 % — ABNORMAL HIGH (ref 0.0–0.2)

## 2019-12-20 LAB — URINALYSIS, MICROSCOPIC (REFLEX)
RBC / HPF: >50 RBC/hpf (ref 0–5)
WBC, UA: >50 WBC/hpf (ref 0–5)

## 2019-12-20 LAB — APTT: aPTT: 97 seconds — ABNORMAL HIGH (ref 24–36)

## 2019-12-20 LAB — TSH: TSH: 0.193 u[IU]/mL — ABNORMAL LOW (ref 0.350–4.500)

## 2019-12-20 LAB — HIV ANTIBODY (ROUTINE TESTING W REFLEX): HIV Screen 4th Generation wRfx: NONREACTIVE

## 2019-12-20 LAB — PROTIME-INR
INR: >10 (ref 0.8–1.2)
Prothrombin Time: >90 seconds — ABNORMAL HIGH (ref 11.4–15.2)

## 2019-12-20 LAB — LACTIC ACID, PLASMA
Lactic Acid, Venous: 0.7 mmol/L (ref 0.5–1.9)
Lactic Acid, Venous: 1.4 mmol/L (ref 0.5–1.9)

## 2019-12-20 LAB — PREPARE RBC (CROSSMATCH)

## 2019-12-20 LAB — SARS CORONAVIRUS 2 BY RT PCR (HOSPITAL ORDER, PERFORMED IN ~~LOC~~ HOSPITAL LAB): SARS Coronavirus 2: NEGATIVE

## 2019-12-20 LAB — CBG MONITORING, ED: Glucose-Capillary: 139 mg/dL — ABNORMAL HIGH (ref 70–99)

## 2019-12-20 MED ORDER — LACTATED RINGERS IV BOLUS
1000.0000 mL | Freq: Once | INTRAVENOUS | Status: AC
  Administered 2019-12-20: 1000 mL via INTRAVENOUS

## 2019-12-20 MED ORDER — SODIUM CHLORIDE 0.9 % IV SOLN
2.0000 g | INTRAVENOUS | Status: DC
  Administered 2019-12-21 – 2019-12-25 (×5): 2 g via INTRAVENOUS
  Filled 2019-12-20 (×5): qty 2

## 2019-12-20 MED ORDER — NOREPINEPHRINE 4 MG/250ML-% IV SOLN
2.0000 ug/min | INTRAVENOUS | Status: DC
  Administered 2019-12-21 (×2): 10 ug/min via INTRAVENOUS
  Administered 2019-12-21: 8 ug/min via INTRAVENOUS
  Filled 2019-12-20 (×4): qty 250

## 2019-12-20 MED ORDER — LEVOTHYROXINE SODIUM 100 MCG PO TABS
200.0000 ug | ORAL_TABLET | Freq: Every day | ORAL | Status: DC
  Administered 2019-12-23 – 2019-12-26 (×4): 200 ug via ORAL
  Filled 2019-12-20 (×4): qty 2

## 2019-12-20 MED ORDER — VANCOMYCIN HCL 2000 MG/400ML IV SOLN
2000.0000 mg | Freq: Once | INTRAVENOUS | Status: DC
  Filled 2019-12-20: qty 400

## 2019-12-20 MED ORDER — LACTATED RINGERS IV BOLUS: 1000.0000 mL | Freq: Once | INTRAVENOUS | Status: DC

## 2019-12-20 MED ORDER — LACTATED RINGERS IV SOLN
INTRAVENOUS | Status: DC
  Administered 2019-12-20: 50 mL/h via INTRAVENOUS

## 2019-12-20 MED ORDER — MIDODRINE HCL 5 MG PO TABS
5.0000 mg | ORAL_TABLET | Freq: Three times a day (TID) | ORAL | Status: DC
  Administered 2019-12-22 – 2019-12-23 (×4): 5 mg via ORAL
  Filled 2019-12-20 (×4): qty 1

## 2019-12-20 MED ORDER — SODIUM CHLORIDE 0.9 % IV SOLN: 2.0000 g | INTRAVENOUS | Status: DC

## 2019-12-20 MED ORDER — DOCUSATE SODIUM 100 MG PO CAPS: 100.0000 mg | ORAL_CAPSULE | Freq: Two times a day (BID) | ORAL | Status: DC | PRN

## 2019-12-20 MED ORDER — SODIUM CHLORIDE 0.9 % IV SOLN: 250.0000 mL | INTRAVENOUS | Status: DC

## 2019-12-20 MED ORDER — SODIUM CHLORIDE 0.9 % IV SOLN
2.0000 g | Freq: Once | INTRAVENOUS | Status: AC
  Administered 2019-12-20: 2 g via INTRAVENOUS
  Filled 2019-12-20: qty 2

## 2019-12-20 MED ORDER — ALBUTEROL SULFATE (2.5 MG/3ML) 0.083% IN NEBU: 2.5000 mg | INHALATION_SOLUTION | RESPIRATORY_TRACT | Status: DC | PRN

## 2019-12-20 MED ORDER — POLYETHYLENE GLYCOL 3350 17 G PO PACK: 17.0000 g | PACK | Freq: Every day | ORAL | Status: DC | PRN

## 2019-12-20 MED ORDER — DEXTROSE 50 % IV SOLN
1.0000 | Freq: Once | INTRAVENOUS | Status: AC
  Administered 2019-12-20: 50 mL via INTRAVENOUS
  Filled 2019-12-20: qty 50

## 2019-12-20 MED ORDER — NOREPINEPHRINE 4 MG/250ML-% IV SOLN
0.0000 ug/min | INTRAVENOUS | Status: DC
  Administered 2019-12-20: 1.333 ug/min via INTRAVENOUS
  Filled 2019-12-20: qty 250

## 2019-12-20 MED ORDER — SODIUM ZIRCONIUM CYCLOSILICATE 10 G PO PACK
10.0000 g | PACK | Freq: Once | ORAL | Status: AC
  Administered 2019-12-20: 10 g via ORAL
  Filled 2019-12-20: qty 1

## 2019-12-20 MED ORDER — SODIUM CHLORIDE 0.9 % IV SOLN: 10.0000 mL/h | Freq: Once | INTRAVENOUS | Status: DC

## 2019-12-20 MED ORDER — BUDESONIDE 0.5 MG/2ML IN SUSP
0.5000 mg | Freq: Two times a day (BID) | RESPIRATORY_TRACT | Status: DC
  Administered 2019-12-21 – 2019-12-27 (×13): 0.5 mg via RESPIRATORY_TRACT
  Filled 2019-12-20 (×14): qty 2

## 2019-12-20 MED ORDER — ALBUMIN HUMAN 25 % IV SOLN
12.5000 g | Freq: Once | INTRAVENOUS | Status: AC
  Administered 2019-12-20: 12.5 g via INTRAVENOUS
  Filled 2019-12-20: qty 50

## 2019-12-20 MED ORDER — ARFORMOTEROL TARTRATE 15 MCG/2ML IN NEBU
15.0000 ug | INHALATION_SOLUTION | Freq: Two times a day (BID) | RESPIRATORY_TRACT | Status: DC
  Administered 2019-12-21 – 2019-12-27 (×13): 15 ug via RESPIRATORY_TRACT
  Filled 2019-12-20 (×14): qty 2

## 2019-12-20 MED ORDER — VITAMIN K1 10 MG/ML IJ SOLN
10.0000 mg | Freq: Once | INTRAVENOUS | Status: AC
  Administered 2019-12-20: 10 mg via INTRAVENOUS
  Filled 2019-12-20: qty 1

## 2019-12-20 NOTE — ED Notes (Signed)
Lona.Candela admitting MD at bedside. Notified of critical labs.

## 2019-12-20 NOTE — Progress Notes (Signed)
Notified provider of need to administer fluid bolus, pt needs 2940 cc.

## 2019-12-20 NOTE — ED Notes (Signed)
MD at bedside. 

## 2019-12-20 NOTE — ED Notes (Signed)
CBG was done at 1609, this tech did not collect specimen. This tech checked off CBG order.

## 2019-12-20 NOTE — ED Provider Notes (Signed)
Left side [name] Provider Note   CSN: [id number] Arrival date & time: 12/20/19  1200     History Chief Complaint  Patient presents with  . Weakness  . Abdominal Pain    [name] is a 64 y.o. female.  HPI Patient presents from [facility].  Left-sided abdominal pain and generalized weakness.  Hypotensive for EMS blood pressure in the 90s.  Already some urinary difficulty.  Pain is dull.  Decreased oral intake.  No fevers.  States she has been having diarrhea.  No chest pain.     Past Medical History:  Diagnosis Date  . Abnormal Pap smear    years ago/no biopsy  . Allergic rhinitis   . Aortic atherosclerosis   . Arthritis    back- lower  . Asthma   . Atrial fibrillation 12/2010   OFF XARELTO LAST MONTH DUE TO BLEEDING IN STOOL  . Bursitis of hip left  . Chronic anticoagulation 12/16/2018   CHADS VASC=2 for sex and H/O HTN- she is on Eliquis  . Chronic diastolic (congestive) heart failure    pt unaware of this  . COPD (chronic obstructive pulmonary disease) with chronic bronchitis   . Decreased pulses in feet   . Dysrhythmia    afib, followed by Dr. Stanford Breed   . Edema of both legs   . Endometrial adenocarcinoma 02/21/2013   S/p D and C, has mirena, being followed by GYN   . Esophagitis 02/02/2014   Distal, linear erosions, noted on endoscopy  . Essential hypertension 01/05/2007   Echo Nov June 2020- EF 50-55%, mild LVH, normal LA   . Fatty liver 01/07/2012  . Fibroid 1974   fibroid cyst on left fallopian tube  . Fibromyalgia   . Foot ulcer    AREA HEALED RIGHT FOOT  . Generalized anxiety disorder   . GERD (gastroesophageal reflux disease)   . Headache    occasional sinus headache   . Hepatomegaly   . History of blood transfusion JULY 2015  . History of cardiomegaly 12/06/2010   Noted on CT  . History of E. coli septicemia   . History of papillary thyroid carcinoma 04/07/2011  . Hypocalcemia 12/10/2016  .  Hypokalemia 12/10/2016  . Hypoparathyroidism 01/07/2011  . Hypothyroidism   . Iron deficiency anemia secondary to malabsorption  05/10/2007   Qualifier: Diagnosis of  By: Wynona Luna    . Kidney stone 10-14-2015   passed on their own  . Leukocytosis 03/24/2009   cta cheQualifier: Diagnosis of  By: Wynona Luna    . Major depressive disorder   . Mild neurocognitive disorder 06/28/2019  . Morbid obesity   . MRSA infection   . Nephrolithiasis 2/16, 9/16   SEES DR Risa Grill  . Osteomyelitis 06/14/2014  . PAF (paroxysmal atrial fibrillation) 02/05/2011   Documented 2012- NSR since.  On chronic anticoagulation.  . Pernicious anemia 02/20/2014   followed by Debbrah Alar  . Pneumonia   . PONV (postoperative nausea and vomiting)   . Pyelonephritis 11/24/2018  . Rash    thighs and back  . Seizures    infancy secondary to fever  . Staph aureus infection   . Thoracic spondylosis 12/06/2010   Noted on CT  . Uterine cancer 2014   Mirena IUD  . UTI (urinary tract infection) 12/13/2018  . Vitamin B 12 deficiency   . Vitamin D deficiency 05/10/2007   Qualifier: Diagnosis of  By: Wynona Luna  Patient Active Problem List   Diagnosis Date Noted  . Knee pain   . Hypothermia 10/15/2019  . Thrombocytopenia ([place]) 10/15/2019  . AKI (acute kidney injury) ([place]) 10/15/2019  . Mild neurocognitive disorder 06/28/2019  . Major depressive disorder   . Generalized anxiety disorder   . Chronic anticoagulation 12/16/2018  . Abnormal nuclear stress test 12/16/2018  . Debilitated patient 12/16/2018  . Pressure injury of skin 12/13/2018  . UTI (urinary tract infection) 12/13/2018  . Pyelonephritis 11/21/2018  . Sepsis due to Escherichia coli (E. coli) ([place]) 11/28/2017  . E coli bacteremia 11/28/2017  . Sepsis ([place]) 11/24/2017  . Hypokalemia 12/10/2016  . Hypocalcemia 12/10/2016  . Recurrent falls 12/09/2016  . Right ureteral stone 07/17/2014  . Osteomyelitis ([place]) 06/14/2014  .  Pernicious anemia 02/20/2014  . Reflux esophagitis 02/02/2014  . Edema 11/23/2013  . Foot pain 03/31/2013  . Endometrial adenocarcinoma ([place]) 02/21/2013  . Trochanteric bursitis of left hip 09/13/2012  . Muscle cramps 08/12/2012  . Fatty liver 01/07/2012  . Mid back pain 09/30/2011  . Weight gain 07/02/2011  . Nonspecific abnormal unspecified cardiovascular function study 04/30/2011  . Hx of papillary thyroid carcinoma 04/07/2011  . Low back pain 04/01/2011  . PAF (paroxysmal atrial fibrillation) 02/05/2011  . Hypoparathyroidism ([place]) 01/07/2011  . GERD (gastroesophageal reflux disease) 05/22/2009  . Leukocytosis 03/24/2009  . Morbid obesity ([place]) 12/20/2008  . Rhinosinusitis, recurrent 09/07/2008  . Vitamin D deficiency 05/10/2007  . Iron deficiency anemia secondary to malabsorption  05/10/2007  . Anemia 05/10/2007  . Hypothyroidism 01/05/2007  . Essential hypertension 01/05/2007  . Asthma 01/05/2007    Past Surgical History:  Procedure Laterality Date  . AMPUTATION Right 05/18/2014   Procedure: RIGHT FIFTH RAY AMPUTATION FOOT;  Surgeon: Wylene Simmer, MD;  Location: [facility];  Service: Orthopedics;  Laterality: Right;  . APPENDECTOMY    . BUNIONECTOMY     bilateral  . COLONOSCOPY    . COLONOSCOPY WITH PROPOFOL N/A 02/02/2014   Procedure: COLONOSCOPY WITH PROPOFOL;  Surgeon: Irene Shipper, MD;  Location: [facility];  Service: Endoscopy;  Laterality: N/A;  . cyst on ovary removed     . CYSTOSCOPY W/ RETROGRADES  03/03/2012   Procedure: CYSTOSCOPY WITH RETROGRADE PYELOGRAM;  Surgeon: Bernestine Amass, MD;  Location: [facility];  Service: Urology;  Laterality: Bilateral;  . CYSTOSCOPY W/ URETERAL STENT PLACEMENT Right 11/25/2017   Procedure: CYSTOSCOPY WITH RETROGRADE RIGHT URETERAL STENT PLACEMENT;  Surgeon: Franchot Gallo, MD;  Location: [facility];  Service: Urology;  Laterality: Right;  . CYSTOSCOPY W/ URETERAL STENT PLACEMENT Right 01/15/2018   Procedure: RIGHT STENT EXCHANGE;  Surgeon:  Ardis Hughs, MD;  Location: [facility];  Service: Urology;  Laterality: Right;  . CYSTOSCOPY WITH RETROGRADE PYELOGRAM, URETEROSCOPY AND STENT PLACEMENT Left 08/25/2012   Procedure: CYSTOSCOPY WITH RETROGRADE PYELOGRAM, URETEROSCOPY;  Surgeon: Bernestine Amass, MD;  [facility];  Service: Urology;  Laterality: Left;  . CYSTOSCOPY WITH STENT PLACEMENT Left 01/15/2018   Procedure: LEFT STENT PLACEMENT;  Surgeon: Ardis Hughs, MD;  [facility];  Service: Urology;  Laterality: Left;  . CYSTOSCOPY WITH STENT PLACEMENT Right 01/22/2018   Procedure: RIGHT STENT REMOVAL;  Surgeon: Ardis Hughs, MD;  [facility];  Service: Urology;  Laterality: Right;  . CYSTOSCOPY/URETEROSCOPY/HOLMIUM LASER/STENT PLACEMENT Right 12/23/2017   Procedure: RIGHT URETEROSCOPY STONE REMOVAL, HOLMIUM LASER, RIGHT /STENT EXCHANGE;  Surgeon: Ardis Hughs, MD;  [facility];  Service: Urology;  Laterality: Right;  . CYSTOSCOPY/URETEROSCOPY/HOLMIUM LASER/STENT  PLACEMENT Left 01/22/2018   Procedure: LEFT URETEROSCOPY STONE REMOVAL HOLMIUM LASER LEFT STENT Freddi Starr;  Surgeon: Ardis Hughs, MD;  [facility];  Service: Urology;  Laterality: Left;  . DILATION AND CURETTAGE OF UTERUS N/A 02/21/2013   Procedure: DILATATION AND CURETTAGE;  Surgeon: Lyman Speller, MD;  Location: [facility];  Service: Gynecology;  Laterality: N/A;  . DILATION AND CURETTAGE OF UTERUS N/A 04/09/2015   Procedure: [place] IUD removal;  Surgeon: Megan Salon, MD;  Location: [facility];  Service: Gynecology;  Laterality: N/A;  Patient weight 307lbs  . ESOPHAGOGASTRODUODENOSCOPY (EGD) WITH PROPOFOL N/A 02/02/2014   Procedure: ESOPHAGOGASTRODUODENOSCOPY (EGD) WITH PROPOFOL;  Surgeon: Irene Shipper, MD;  [facility];  Service: Endoscopy;  Laterality: N/A;  . GASTRIC BYPASS  1974  . HOLMIUM LASER APPLICATION Left 4/0/3474   Procedure: HOLMIUM LASER APPLICATION;  Surgeon:  Bernestine Amass, MD;  [facility];  Service: Urology;  Laterality: Left;  . LITHOTRIPSY  03/2012  . LITHOTRIPSY  2/16  . THYROIDECTOMY  05/15/2010  . TONSILLECTOMY AND ADENOIDECTOMY    . UPPER GASTROINTESTINAL ENDOSCOPY    . URETEROSCOPY  03/03/2012   Procedure: URETEROSCOPY;  Surgeon: Bernestine Amass, MD;  [facility];  Service: Urology;  Laterality: Left;  . URETEROSCOPY WITH HOLMIUM LASER LITHOTRIPSY Bilateral 01/15/2018   Procedure: CYSTOSCOPY/BILATERAL URETEROSCOPY WITH HOLMIUM LASER LITHOTRIPSY STONE REMOVAL;  Surgeon: Ardis Hughs, MD;  [facility];  Service: Urology;  Laterality: Bilateral;     OB History    Gravida  0   Para  0   Term  0   Preterm  0   AB  0   Living  0     SAB  0   TAB  0   Ectopic  0   Multiple  0   Live Births              Family History  Problem Relation Age of Onset  . Hypertension Father   . Diabetes Father   . Lung cancer Father   . Heart attack Father        MI at age 1  . Hypertension Mother   . Hyperthyroidism Mother   . Heart disease Mother   . Heart attack Mother        MI at age 66  . Asthma Brother   . Hypertension Brother        younger  . Heart disease Brother        older  . Dementia Paternal Aunt        Unspecified type; symptoms emerged with advanced age  . Colon cancer Neg Hx   . Esophageal cancer Neg Hx   . Stomach cancer Neg Hx   . Kidney disease Neg Hx   . Liver disease Neg Hx   . Pancreatic cancer Neg Hx     Social History   Tobacco Use  . Smoking status: Former Smoker    Packs/day: 0.50    Years: 25.00    Pack years: 12.50    Types: Cigarettes    Start date: 12/24/1970    Quit date: 05/27/1995    Years since quitting: 24.5  . Smokeless tobacco: Never Used  Vaping Use  . Vaping Use: Never used  Substance Use Topics  . Alcohol use: Not Currently    Alcohol/week: 0.0 standard drinks    Comment: <12 oz per month  . Drug use: No    Home  Medications Prior to Admission  medications   Medication Sig Start Date End Date Taking? Authorizing Provider  acetaminophen (TYLENOL) 325 MG tablet Take 2 tablets (650 mg total) by mouth every 6 (six) hours as needed for mild pain or moderate pain. 12/08/16  Yes Waynetta Pean, PA-C  albuterol (PROVENTIL HFA;VENTOLIN HFA) 108 (90 Base) MCG/ACT inhaler Inhale 2 puffs into the lungs every 6 (six) hours as needed for wheezing or shortness of breath. Patient taking differently: Inhale 1 puff into the lungs in the morning, at noon, in the evening, and at bedtime.  11/10/16  Yes Debbrah Alar, NP  albuterol (PROVENTIL) (2.5 MG/3ML) 0.083% nebulizer solution Take 3 mLs (2.5 mg total) by nebulization every 6 (six) hours as needed for wheezing or shortness of breath. 01/29/18  Yes Debbrah Alar, NP  Amino Acids-Protein Hydrolys (FEEDING SUPPLEMENT, PRO-STAT SUGAR FREE 64,) LIQD Take 30 mLs by mouth 3 (three) times daily with meals.   Yes [provider]  apixaban (ELIQUIS) 5 MG TABS tablet Take 1 tablet (5 mg total) by mouth 2 (two) times daily. 09/19/19  Yes Lelon Perla, MD  calcitRIOL (ROCALTROL) 0.5 MCG capsule Take 2 mcg by mouth daily.    Yes [provider]  Copper Gluconate (COPPER CAPS) 2 MG CAPS Take 1 tablet by mouth daily. 10/20/19  Yes Amin, Jeanella Flattery, MD  CRANBERRY CONCENTRATE PO Take 500 mg by mouth daily.    Yes [provider]  diclofenac sodium (VOLTAREN) 1 % GEL Apply 2 g topically 4 (four) times daily. Patient taking differently: Apply 2 g topically 4 (four) times daily as needed (pain).  05/05/18  Yes Debbrah Alar, NP  diltiazem (CARDIZEM CD) 120 MG 24 hr capsule Take 1 capsule (120 mg total) by mouth daily. 06/29/19 12/20/19 Yes Debbrah Alar, NP  EPINEPHrine (EPIPEN 2-PAK) 0.3 mg/0.3 mL IJ SOAJ injection Inject 0.3 mg into the muscle once as needed for anaphylaxis (severe allergic reaction).    Yes [provider]  feeding supplement (BOOST HIGH PROTEIN)  LIQD Take 1 Container by mouth 2 (two) times daily between meals.   Yes [provider]  lactulose (CHRONULAC) 10 GM/15ML solution Take 30 mLs (20 g total) by mouth 3 (three) times daily. Titrate to 3 soft bowel movements daily. Patient taking differently: Take 20 g by mouth daily.  10/20/19  Yes Amin, Ankit Chirag, MD  levocetirizine (XYZAL) 5 MG tablet TAKE 1 TABLET BY MOUTH  EVERY EVENING Patient taking differently: Take 5 mg by mouth every morning.  08/18/18  Yes Debbrah Alar, NP  levothyroxine (SYNTHROID, LEVOTHROID) 100 MCG tablet Take 400 mcg by mouth daily before breakfast.    Yes [provider]  loperamide (IMODIUM) 2 MG capsule Take 2 mg by mouth 3 (three) times daily as needed for diarrhea or loose stools.   Yes [provider]  Melatonin 5 MG TABS Take 5 mg by mouth at bedtime.    Yes [provider]  midodrine (PROAMATINE) 2.5 MG tablet Take 2.5 mg by mouth 3 (three) times daily with meals.   Yes [provider]  midodrine (PROAMATINE) 5 MG tablet Take 5 mg by mouth 3 (three) times daily with meals.   Yes [provider]  Multiple Vitamins-Minerals (DECUBI-VITE) CAPS Take 1 capsule by mouth daily.   Yes [provider]  MYRBETRIQ 50 MG TB24 tablet Take 50 mg by mouth daily. 07/25/19  Yes [provider]  nystatin (MYCOSTATIN/NYSTOP) powder Apply topically 2 (two) times daily. Patient taking  differently: Apply 1 application topically 2 (two) times daily as needed (irritation).  12/22/18  Yes Debbrah Alar, NP  nystatin cream (MYCOSTATIN) Apply 1 application topically 2 (two) times daily. 01/21/19  Yes Debbrah Alar, NP  omeprazole (PRILOSEC) 40 MG capsule Take 1 capsule (40 mg total) by mouth in the morning and at bedtime. 09/08/19  Yes Levin Erp, PA  ondansetron (ZOFRAN) 4 MG tablet Take 1 tablet (4 mg total) by mouth every 8 (eight) hours as needed for nausea or vomiting. 12/30/17  Yes  Debbrah Alar, NP  PARoxetine (PAXIL) 10 MG tablet Take 10 mg by mouth daily.   Yes [provider]  potassium chloride SA (KLOR-CON) 20 MEQ tablet Take 20 mEq by mouth daily.   Yes [provider]  Probiotic Product (PROBIOTIC DAILY PO) Take 2 capsules by mouth in the morning, at noon, and at bedtime.   Yes [provider]  sodium bicarbonate 650 MG tablet Take 650 mg by mouth 3 (three) times daily.   Yes [provider]  SYMBICORT 160-4.5 MCG/ACT inhaler INHALE 2 PUFFS BY MOUTH TWO TIMES DAILY Patient taking differently: Inhale 2 puffs into the lungs in the morning and at bedtime.  12/16/18  Yes Debbrah Alar, NP  Syringe/Needle, Disp, (SYRINGE 3CC/20GX1") 20G X 1" 3 ML MISC Use monthly as directed Patient taking differently: 1 each by Other route every 30 (thirty) days.  08/04/18  Yes Debbrah Alar, NP  traZODone (DESYREL) 50 MG tablet Take 50 mg by mouth at bedtime.   Yes [provider]  Vitamin D, Ergocalciferol, (DRISDOL) 1.25 MG (50000 UNIT) CAPS capsule TAKE 1 CAPSULE BY MOUTH  EVERY [place], [place],  [place]. Patient taking differently: Take 50,000 Units by mouth 3 (three) times a week. TAKE 1 CAPSULE BY MOUTH  EVERY MONDAY, WEDNESDAY,  AND FRIDAY. 09/16/19  Yes Debbrah Alar, NP  zafirlukast (ACCOLATE) 20 MG tablet Take 1 tablet (20 mg total) by mouth 2 (two) times daily before a meal. 07/20/19  Yes Debbrah Alar, NP  cyanocobalamin (,VITAMIN B-12,) 1000 MCG/ML injection ADMINISTER 1ML (1,000 MCG) UNDER THE SKIN EVERY 30 DAYS Patient not taking: Reported on 12/20/2019 08/15/19   Debbrah Alar, NP  desoximetasone (TOPICORT) 0.05 % cream Apply topically 2 (two) times daily as needed. Patient not taking: Reported on 12/20/2019 01/29/17   Debbrah Alar, NP  furosemide (LASIX) 40 MG tablet Take 1 tablet (40 mg total) by mouth 2 (two) times daily. Patient not taking: Reported on 12/20/2019 06/17/19   Lelon Perla, MD  hydrocortisone cream 1 % Apply topically 4 (four) times daily as needed for itching. Patient not taking: Reported on 12/20/2019 10/24/18   Cherylann Ratel A, DO  senna-docusate (SENOKOT-S) 8.6-50 MG tablet Take 1 tablet by mouth at bedtime as needed for mild constipation. Patient not taking: Reported on 12/20/2019 10/20/19   Damita Lack, MD  simethicone (GAS-X) 80 MG chewable tablet Chew 1 tablet by mouth 4 (four) times daily as needed for flatulence.  Patient not taking: Reported on 12/20/2019    [provider]  spironolactone (ALDACTONE) 25 MG tablet Take 1 tablet (25 mg total) by mouth daily. Patient not taking: Reported on 12/20/2019 07/20/19   Debbrah Alar, NP  sulfamethoxazole-trimethoprim (BACTRIM DS) 800-160 MG tablet Take 1 tablet by mouth 2 (two) times daily. Patient not taking: Reported on 12/20/2019    [provider]  traMADol (ULTRAM) 50 MG tablet Take 1 tablet (50 mg total) by mouth every 12 (twelve) hours  as needed for moderate pain. TAKE 1 TABLET(50 MG) BY MOUTH EVERY 12 HOURS AS NEEDED Patient not taking: Reported on 12/20/2019 10/20/19   Damita Lack, MD  tuberculin (TUBERSOL) 5 UNIT/0.1ML injection Inject 0.1 mLs into the skin once. Patient not taking: Reported on 12/20/2019    [provider]    Allergies    Other, Amoxicillin-pot clavulanate, Ciprofloxacin, Food, Keflex [cephalexin], Penicillins, Rocephin [ceftriaxone], and Tomato  Review of Systems   Review of Systems  Constitutional: Positive for appetite change and fatigue. Negative for fever.  Respiratory: Negative for shortness of breath.   Cardiovascular: Negative for chest pain.  Gastrointestinal: Positive for abdominal pain and diarrhea.  Genitourinary: Positive for dysuria.  Musculoskeletal: Negative for back pain.  Skin: Negative for rash.  Neurological: Positive for light-headedness.  Hematological: Negative for adenopathy.  Psychiatric/Behavioral: Negative  for confusion.    Physical Exam Updated Vital Signs BP (!) 90/38   Pulse 71   Temp (!) 91.1 F (32.8 C) (Rectal)   Resp (!) 11   LMP 06/11/2012   SpO2 92%   Physical Exam Vitals and nursing note reviewed.  Constitutional:      Appearance: She is obese.  HENT:     Head: Atraumatic.  Eyes:     Pupils: Pupils are equal, round, and reactive to light.  Cardiovascular:     Rate and Rhythm: Normal rate and regular rhythm.  Pulmonary:     Breath sounds: No wheezing, rhonchi or rales.  Abdominal:     Hernia: No hernia is present.     Comments: Lower abdominal to left lower quadrant tenderness.  Does have suprapubic fullness.  Skin:    General: Skin is warm.     Capillary Refill: Capillary refill takes less than 2 seconds.     Comments: Pitting edema bilateral lower extremities.  Neurological:     Mental Status: She is alert.     Comments: Awakes and answers questions.     ED Results / Procedures / Treatments   Labs (all labs ordered are listed, but only abnormal results are displayed) Labs Reviewed  COMPREHENSIVE METABOLIC PANEL - Abnormal; Notable for the following components:      Result Value   Potassium 5.3 (*)    CO2 20 (*)    Glucose, Bld 59 (*)    BUN 59 (*)    Creatinine, Ser 3.36 (*)    Calcium 7.6 (*)    Total Protein 4.8 (*)    Albumin 1.4 (*)    AST 113 (*)    ALT 91 (*)    Alkaline Phosphatase 250 (*)    GFR calc non Af Amer 14 (*)    GFR calc Af Amer 16 (*)    All other components within normal limits  CBC WITH DIFFERENTIAL/PLATELET - Abnormal; Notable for the following components:   RBC 2.82 (*)    Hemoglobin 8.0 (*)    HCT 26.3 (*)    RDW 22.5 (*)    nRBC 0.3 (*)    Lymphs Abs 0.3 (*)    All other components within normal limits  APTT - Abnormal; Notable for the following components:   aPTT 97 (*)    All other components within normal limits  PROTIME-INR - Abnormal; Notable for the following components:   Prothrombin Time >90.0 (*)    INR  >10.0 (*)    All other components within normal limits  TSH - Abnormal; Notable for the following components:   TSH 0.193 (*)  All other components within normal limits  CBG MONITORING, ED - Abnormal; Notable for the following components:   Glucose-Capillary 139 (*)    All other components within normal limits  URINE CULTURE  CULTURE, BLOOD (ROUTINE X 2)  CULTURE, BLOOD (ROUTINE X 2)  LACTIC ACID, PLASMA  URINALYSIS, ROUTINE W REFLEX MICROSCOPIC  LACTIC ACID, PLASMA    EKG EKG Interpretation  Date/Time:  Tuesday December 20 2019 12:17:51 EDT Ventricular Rate:  65 PR Interval:    QRS Duration: 98 QT Interval:  529 QTC Calculation: 551 R Axis:   17 Text Interpretation: Sinus rhythm Borderline T abnormalities, diffuse leads Prolonged QT interval Confirmed by Davonna Belling [phone]) on 12/20/2019 1:09:01 PM   Radiology DG Abdomen Acute W/Chest  Result Date: 12/20/2019 CLINICAL DATA:  Abdominal pain, diarrhea EXAM: DG ABDOMEN ACUTE W/ 1V CHEST COMPARISON:  2020 FINDINGS: Increased density at the right lung base. No pleural effusion. Normal heart size. Bowel gas pattern is unremarkable. No free air. No significant stool burden. Intrauterine device is present. IMPRESSION: Unremarkable bowel gas pattern. Right basilar atelectasis/consolidation. Electronically Signed   By: Macy Mis M.D.   On: 12/20/2019 15:24    Procedures Procedures (including critical care time)  Medications Ordered in ED Medications  lactated ringers bolus 1,000 mL (0 mLs Intravenous Stopped 12/20/19 1602)  ceFEPIme (MAXIPIME) 2 g in sodium chloride 0.9 % 100 mL IVPB (0 g Intravenous Stopped 12/20/19 1601)  dextrose 50 % solution 50 mL (50 mLs Intravenous Given 12/20/19 1551)  lactated ringers bolus 1,000 mL (0 mLs Intravenous Stopped 12/20/19 1602)  lactated ringers bolus 1,000 mL (1,000 mLs Intravenous New Bag/Given 12/20/19 1603)    ED Course  I have reviewed the triage vital signs and the nursing  notes.  Pertinent labs & imaging results that were available during my care of the patient were reviewed by me and considered in my medical decision making (see chart for details).    MDM Rules/Calculators/A&P                         Patient came in with pain and generalized weakness for the last day. Hypotensive. Initially blood pressure low. There was a delay in the temperature being done. Once it was actually done did show hypothermia. However there is not necessarily initial source of infection. Did have urinary retention and it took a while to be able to get a catheter in. Once catheter got and it showed cloudy urine that potentially could be the source of infection. That was done at about 4:00 and became the time for the diagnosis of code sepsis since before this we did not have enough criteria for sepsis since did not have white count tachycardia or enough SIRS criteria. Did have initial lactic acid that was normal. Does have acute kidney injury also. Code sepsis have been called before all the criteria is back under suspicion of sepsis but do not have criteria to diagnosis at that time.  Hemoglobin appears to be near her baseline. Discussed with pharmacy and cefepime was given to cover urinary abdominal source. Patient had 700 cc of urine in the bladder. Also INR severely elevated. Could be due to Eliquis in the renal failure. Will get CT scan to evaluate for retroperitoneal bleed. Will require admission to the hospital. Care turned over to Dr. Francia Greaves.    CRITICAL CARE Performed by: Davonna Belling Total critical care time: 30 minutes Critical care time was exclusive of separately billable procedures  and treating other patients. Critical care was necessary to treat or prevent imminent or life-threatening deterioration. Critical care was time spent personally by me on the following activities: development of treatment plan with patient and/or surrogate as well as nursing, discussions with  consultants, evaluation of patient's response to treatment, examination of patient, obtaining history from patient or surrogate, ordering and performing treatments and interventions, ordering and review of laboratory studies, ordering and review of radiographic studies, pulse oximetry and re-evaluation of patient's condition.   Final Clinical Impression(s) / ED Diagnoses Final diagnoses:  AKI (acute kidney injury) ([place])  Hypotension, unspecified hypotension type    Rx / DC Orders ED Discharge Orders    None       Davonna Belling, MD 12/20/19 1645

## 2019-12-20 NOTE — ED Provider Notes (Signed)
4:06 PM BLADDER CATHETERIZATION  Date/Time: 12/20/2019 4:06 PM Performed by: Margarita Mail, PA-C Authorized by: Margarita Mail, PA-C   Consent:    Consent obtained:  Verbal   Consent given by:  Patient   Risks discussed:  False passage, incomplete procedure, urethral injury, pain and infection   Alternatives discussed:  No treatment Pre-procedure details:    Procedure purpose:  Diagnostic   Preparation: Patient was prepped and draped in usual sterile fashion   Anesthesia (see MAR for exact dosages):    Anesthesia method:  Topical application   Topical anesthetic:  Lidocaine gel Procedure details:    Provider performed due to:  Complicated insertion and nurse unable to complete   Catheter insertion:  Temporary indwelling   Catheter type:  Foley   Catheter size:  14 Fr   Bladder irrigation: no     Number of attempts:  1   Urine characteristics:  Bloody and foul smelling Post-procedure details:    Patient tolerance of procedure:  Tolerated well, no immediate complications      Margarita Mail, PA-C 12/20/19 1608    Davonna Belling, MD 12/20/19 1630

## 2019-12-20 NOTE — ED Provider Notes (Addendum)
Patient seen after prior ED provider.  Patient with persistent hypotension, hypothermia, and evidence of coagulopathy.  BP remains low despite aggressive fluid resuscitation.  Critical care called for evaluation and admission.  Patient's brother, POA, is at bedside and understands plan of care.  Brother also confirms patient's DNR status.   CRITICAL CARE Performed by: Valarie Merino   Total critical care time: 30 minutes  Critical care time was exclusive of separately billable procedures and treating other patients.  Critical care was necessary to treat or prevent imminent or life-threatening deterioration.  Critical care was time spent personally by me on the following activities: development of treatment plan with patient and/or surrogate as well as nursing, discussions with consultants, evaluation of patient's response to treatment, examination of patient, obtaining history from patient or surrogate, ordering and performing treatments and interventions, ordering and review of laboratory studies, ordering and review of radiographic studies, pulse oximetry and re-evaluation of patient's condition.     Valarie Merino, MD 12/20/19 2004    Valarie Merino, MD 12/20/19 [id number]

## 2019-12-20 NOTE — H&P (Signed)
NAME:  [name], MRN:  [id number], DOB:  January 20, 1956, LOS: 0 ADMISSION DATE:  12/20/2019, CONSULTATION DATE:  12/20/19 REFERRING MD:  Francia Greaves  CHIEF COMPLAINT:  abd pain, weakness  Brief History   [name] is a 64 y.o. female who was admitted 7/27 with urosepsis.  History of present illness   [name] is a 64 y.o. female who has a PMH as outlined below.  She presented to [facility] ED 7/27 from rehab due to left sided abdominal discomfort and generalized weakness.  She had recent admission 10/14/19 through 10/22/19 for sepsis 2/2 E.coli UTI.  She was discharged to rehab facility after that admission for ongoing rehab.  Per brother, over the past 2 weeks or so, pt has not been able to stand at all.  She has basically been bedridden.  On 7/27, she wasn't able to state his name and sounded confused.  Brother felt she likely had recurrent UTI's due to her behaving similarly in the past with prior UTI's.  She was also borderline hypotensive.  In ED, UA confirmed UTI.  She also had multiple metabolic derangements as well as anemia with significant coagulopathy (INR > 10).  She was given 10mg  vitamin K and has 1u PRBC ordered.  Besides bloody urine, there has been no obvious bleeding.  Past Medical History  has Hypothyroidism; Vitamin D deficiency; Morbid obesity ([place]); Iron deficiency anemia secondary to malabsorption ; Anemia; Leukocytosis; Essential hypertension; Rhinosinusitis, recurrent; Asthma; GERD (gastroesophageal reflux disease); Hypoparathyroidism ([place]); PAF (paroxysmal atrial fibrillation); Low back pain; Hx of papillary thyroid carcinoma; Nonspecific abnormal unspecified cardiovascular function study; Weight gain; Mid back pain; Fatty liver; Muscle cramps; Trochanteric bursitis of left hip; Endometrial adenocarcinoma ([place]); Foot pain; Edema; Reflux esophagitis; Pernicious anemia; Osteomyelitis ([place]); Right ureteral stone; Recurrent falls; Hypokalemia; Hypocalcemia; Sepsis ([place]);  Sepsis due to Escherichia coli (E. coli) ([place]); E coli bacteremia; Pyelonephritis; Pressure injury of skin; UTI (urinary tract infection); Chronic anticoagulation; Abnormal nuclear stress test; Debilitated patient; Mild neurocognitive disorder; Major depressive disorder; Generalized anxiety disorder; Hypothermia; Thrombocytopenia ([place]); AKI (acute kidney injury) ([place]); and Knee pain on their problem list.  Significant Hospital Events   7/27 > admit.  Consults:  None.  Procedures:  None.  Significant Diagnostic Tests:  CT abd / pelv 7/27 > no hemorrhage, third spacing, bilateral non-obstructing renal calculi, hepatosplenomegaly. CT head 7/27 > neg.  Micro Data:  COVID 7/27 > neg. Blood 7/27 >  Urine 7/27 >   Antimicrobials:  Ceftriaxone 7/27 >    Interim history/subjective:  No complaints, feels somewhat better.  Objective:  Blood pressure (!) 107/51, pulse 76, temperature (!) 94.1 F (34.5 C), temperature source Bladder, resp. rate (!) 11, last menstrual period 06/11/2012, SpO2 91 %.        Intake/Output Summary (Last 24 hours) at 12/20/2019 2005 Last data filed at 12/20/2019 1948 Gross per 24 hour  Intake 315 ml  Output 700 ml  Net -385 ml   There were no vitals filed for this visit.  Examination: General: Adult female, slightly confused, in NAD. Neuro: Awake but confused.  MAE's. HEENT: [place]/AT. Sclerae anicteric.  EOMI. Cardiovascular: RRR, no M/R/G.  Lungs: Respirations even and unlabored.  CTA bilaterally, No W/R/R.  Abdomen: Obese.  BS x 4, soft, NT/ND.  Musculoskeletal: No gross deformities, 1+ edema to knees. Skin: Bruising to bilateral UE's.  Skin otherwise warm, no rashes. GU:  Dark bloody urine in foley bag.  Assessment & Plan:   Septic shock - 2/2 UTI,  presumed E.coli (has had multiple E.Coli UTI's in the past). Acute encephalopathy - 2/2 above. - Continue low dose levophed as needed to maintain MAP > 65. - Continue fluids. - Albumin x 1 dose. -  Empiric ceftriaxone for now. - Follow cultures. - DNR confirmed with brother.  Anemia - no obvious bleeding besides hematuria. - 1u PRBC now. - H/H q6hrs, maintain Hgb > 7.  Coagulopathy - s/p 10mg  vitamin K. Thrombocytopenia. - Daily coags. - Defer FFP and / or platelets for now given no obvious bleeding.  AKI. Mild hyperkalemia. - Continue supportive care. - 10g Lokelma x 1. - Follow BMP.  Hx PAF (on eliquis), dCHF. - Hold home eliquis for now. - Hold home diltiazem, furosemide, spironolactone.  Hx hypothyroidism. - Continue home synthroid.  Hx COPD. - BD's.  Best Practice:  Diet: NPO. Pain/Anxiety/Delirium protocol (if indicated): N/A. VAP protocol (if indicated): N/A. DVT prophylaxis: SCD's only. GI prophylaxis: None. Glucose control: None. Mobility: Bedrest. Code Status: DNR. Family Communication: Brother updated at bedside.  Confirmed DNR status. Disposition: ICU.  Labs   CBC: Recent Labs  Lab 12/20/19 1323 12/20/19 1755  WBC 6.5 6.6  NEUTROABS 5.9  --   HGB 8.0* 6.7*  HCT 26.3* 22.5*  MCV 93.3 93.0  PLT PLATELET CLUMPS NOTED ON SMEAR, COUNT APPEARS DECREASED 25*   Basic Metabolic Panel: Recent Labs  Lab 12/20/19 1323  NA 135  K 5.3*  CL 101  CO2 20*  GLUCOSE 59*  BUN 59*  CREATININE 3.36*  CALCIUM 7.6*   GFR: CrCl cannot be calculated (Unknown ideal weight.). Recent Labs  Lab 12/20/19 1323 12/20/19 1735 12/20/19 1755  WBC 6.5  --  6.6  LATICACIDVEN 0.7 1.4  --    Liver Function Tests: Recent Labs  Lab 12/20/19 1323  AST 113*  ALT 91*  ALKPHOS 250*  BILITOT 1.0  PROT 4.8*  ALBUMIN 1.4*   No results for input(s): LIPASE, AMYLASE in the last 168 hours. No results for input(s): AMMONIA in the last 168 hours. ABG    Component Value Date/Time   PHART 7.445 10/17/2019 2015   PCO2ART 29.0 (L) 10/17/2019 2015   PO2ART 106 10/17/2019 2015   HCO3 19.7 (L) 10/17/2019 2015   TCO2 21 (L) 10/14/2019 1338   ACIDBASEDEF 3.8 (H)  10/17/2019 2015   O2SAT 95.9 10/17/2019 2015    Coagulation Profile: Recent Labs  Lab 12/20/19 1434  INR >10.0*   Cardiac Enzymes: No results for input(s): CKTOTAL, CKMB, CKMBINDEX, TROPONINI in the last 168 hours. HbA1C: No results found for: HGBA1C CBG: Recent Labs  Lab 12/20/19 1608  GLUCAP 139*    Review of Systems:   Unable to obtain as pt is encephalopathic.  Past medical history  She,  has a past medical history of Abnormal Pap smear, Allergic rhinitis, Aortic atherosclerosis, Arthritis, Asthma, Atrial fibrillation (12/2010), Bursitis of hip (left), Chronic anticoagulation (12/16/2018), Chronic diastolic (congestive) heart failure, COPD (chronic obstructive pulmonary disease) with chronic bronchitis, Decreased pulses in feet, Dysrhythmia, Edema of both legs, Endometrial adenocarcinoma (02/21/2013), Esophagitis (02/02/2014), Essential hypertension (01/05/2007), Fatty liver (01/07/2012), Fibroid (1974), Fibromyalgia, Foot ulcer, Generalized anxiety disorder, GERD (gastroesophageal reflux disease), Headache, Hepatomegaly, History of blood transfusion (JULY 2015), History of cardiomegaly (12/06/2010), History of E. coli septicemia, History of papillary thyroid carcinoma (04/07/2011), Hypocalcemia (12/10/2016), Hypokalemia (12/10/2016), Hypoparathyroidism (01/07/2011), Hypothyroidism, Iron deficiency anemia secondary to malabsorption  (05/10/2007), Kidney stone (08/2015), Leukocytosis (03/24/2009), Major depressive disorder, Mild neurocognitive disorder (06/28/2019), Morbid obesity, MRSA infection, Nephrolithiasis (2/16, 9/16), Osteomyelitis (06/14/2014),  PAF (paroxysmal atrial fibrillation) (02/05/2011), Pernicious anemia (02/20/2014), Pneumonia, PONV (postoperative nausea and vomiting), Pyelonephritis (11/24/2018), Rash, Seizures, Staph aureus infection, Thoracic spondylosis (12/06/2010), Uterine cancer (2014), UTI (urinary tract infection) (12/13/2018), Vitamin B 12 deficiency, and Vitamin D  deficiency (05/10/2007).   Surgical History    Past Surgical History:  Procedure Laterality Date  . AMPUTATION Right 05/18/2014   Procedure: RIGHT FIFTH RAY AMPUTATION FOOT;  Surgeon: Wylene Simmer, MD;  Location: [place];  Service: Orthopedics;  Laterality: Right;  . APPENDECTOMY    . BUNIONECTOMY     bilateral  . COLONOSCOPY    . COLONOSCOPY WITH PROPOFOL N/A 02/02/2014   Procedure: COLONOSCOPY WITH PROPOFOL;  Surgeon: Irene Shipper, MD;  Location: [facility];  Service: Endoscopy;  Laterality: N/A;  . cyst on ovary removed     . CYSTOSCOPY W/ RETROGRADES  03/03/2012   Procedure: CYSTOSCOPY WITH RETROGRADE PYELOGRAM;  Surgeon: Bernestine Amass, MD;  Location: [facility];  Service: Urology;  Laterality: Bilateral;  . CYSTOSCOPY W/ URETERAL STENT PLACEMENT Right 11/25/2017   Procedure: CYSTOSCOPY WITH RETROGRADE RIGHT URETERAL STENT PLACEMENT;  Surgeon: Franchot Gallo, MD;  Location: [facility];  Service: Urology;  Laterality: Right;  . CYSTOSCOPY W/ URETERAL STENT PLACEMENT Right 01/15/2018   Procedure: RIGHT STENT EXCHANGE;  Surgeon: Ardis Hughs, MD;  Location: [facility];  Service: Urology;  Laterality: Right;  . CYSTOSCOPY WITH RETROGRADE PYELOGRAM, URETEROSCOPY AND STENT PLACEMENT Left 08/25/2012   Procedure: CYSTOSCOPY WITH RETROGRADE PYELOGRAM, URETEROSCOPY;  Surgeon: Bernestine Amass, MD;  [facility];  Service: Urology;  Laterality: Left;  . CYSTOSCOPY WITH STENT PLACEMENT Left 01/15/2018   Procedure: LEFT STENT PLACEMENT;  Surgeon: Ardis Hughs, MD;  [facility];  Service: Urology;  Laterality: Left;  . CYSTOSCOPY WITH STENT PLACEMENT Right 01/22/2018   Procedure: RIGHT STENT REMOVAL;  Surgeon: Ardis Hughs, MD;  [facility];  Service: Urology;  Laterality: Right;  . CYSTOSCOPY/URETEROSCOPY/HOLMIUM LASER/STENT PLACEMENT Right 12/23/2017   Procedure: RIGHT URETEROSCOPY STONE REMOVAL, HOLMIUM LASER, RIGHT /STENT EXCHANGE;  Surgeon: Ardis Hughs, MD;  [facility];  Service: Urology;  Laterality: Right;  . CYSTOSCOPY/URETEROSCOPY/HOLMIUM LASER/STENT PLACEMENT Left 01/22/2018   Procedure: LEFT URETEROSCOPY STONE REMOVAL HOLMIUM LASER LEFT STENT Freddi Starr;  Surgeon: Ardis Hughs, MD;  [facility];  Service: Urology;  Laterality: Left;  . DILATION AND CURETTAGE OF UTERUS N/A 02/21/2013   Procedure: DILATATION AND CURETTAGE;  Surgeon: Lyman Speller, MD;  Location: [facility];  Service: Gynecology;  Laterality: N/A;  . DILATION AND CURETTAGE OF UTERUS N/A 04/09/2015   Procedure: [place] IUD removal;  Surgeon: Megan Salon, MD;  Location: [facility];  Service: Gynecology;  Laterality: N/A;  Patient weight 307lbs  . ESOPHAGOGASTRODUODENOSCOPY (EGD) WITH PROPOFOL N/A 02/02/2014   Procedure: ESOPHAGOGASTRODUODENOSCOPY (EGD) WITH PROPOFOL;  Surgeon: Irene Shipper, MD;  [facility];  Service: Endoscopy;  Laterality: N/A;  . GASTRIC BYPASS  1974  . HOLMIUM LASER APPLICATION Left 2/0/2542   Procedure: HOLMIUM LASER APPLICATION;  Surgeon: Bernestine Amass, MD;  [facility];  Service: Urology;  Laterality: Left;  . LITHOTRIPSY  03/2012  . LITHOTRIPSY  2/16  . THYROIDECTOMY  05/15/2010  . TONSILLECTOMY AND ADENOIDECTOMY    . UPPER GASTROINTESTINAL ENDOSCOPY    . URETEROSCOPY  03/03/2012   Procedure: URETEROSCOPY;  Surgeon: Bernestine Amass, MD;  [facility];  Service: Urology;  Laterality: Left;  . URETEROSCOPY WITH HOLMIUM LASER LITHOTRIPSY Bilateral 01/15/2018  Procedure: CYSTOSCOPY/BILATERAL URETEROSCOPY WITH HOLMIUM LASER LITHOTRIPSY STONE REMOVAL;  Surgeon: Ardis Hughs, MD;  Location[facility];  Service: Urology;  Laterality: Bilateral;     Social History   reports that she quit smoking about 24 years ago. Her smoking use included cigarettes. She started smoking about 49 years ago. She has a 12.50 pack-year smoking history. She has never used smokeless tobacco. She reports previous alcohol  use. She reports that she does not use drugs.   Family history   Her family history includes Asthma in her brother; Dementia in her paternal aunt; Diabetes in her father; Heart attack in her father and mother; Heart disease in her brother and mother; Hypertension in her brother, father, and mother; Hyperthyroidism in her mother; Lung cancer in her father. There is no history of Colon cancer, Esophageal cancer, Stomach cancer, Kidney disease, Liver disease, or Pancreatic cancer.   Allergies Allergies  Allergen Reactions  . Other Anaphylaxis    Reaction to tree nuts  . Amoxicillin-Pot Clavulanate Other (See Comments)    headache  . Ciprofloxacin Nausea Only and Rash  . Food Hives and Other (See Comments)    Potato  . Keflex [Cephalexin] Rash  . Penicillins Rash    Headache. And hives - Dose not tolerate Cephalosporin either   Has patient had a PCN reaction causing immediate rash, facial/tongue/throat swelling, SOB or lightheadedness with hypotension: No Has patient had a PCN reaction causing severe rash involving mucus membranes or skin necrosis: No Has patient had a PCN reaction that required hospitalization: No Has patient had a PCN reaction occurring within the last 10 years: Yes (reaction was May 2020) If all of the above answers are "NO", then may proce  . Rocephin [Ceftriaxone] Rash  . Tomato Hives     Home meds  Prior to Admission medications   Medication Sig Start Date End Date Taking? Authorizing Provider  acetaminophen (TYLENOL) 325 MG tablet Take 2 tablets (650 mg total) by mouth every 6 (six) hours as needed for mild pain or moderate pain. 12/08/16  Yes Waynetta Pean, PA-C  albuterol (PROVENTIL HFA;VENTOLIN HFA) 108 (90 Base) MCG/ACT inhaler Inhale 2 puffs into the lungs every 6 (six) hours as needed for wheezing or shortness of breath. Patient taking differently: Inhale 1 puff into the lungs in the morning, at noon, in the evening, and at bedtime.  11/10/16  Yes Debbrah Alar, NP  albuterol (PROVENTIL) (2.5 MG/3ML) 0.083% nebulizer solution Take 3 mLs (2.5 mg total) by nebulization every 6 (six) hours as needed for wheezing or shortness of breath. 01/29/18  Yes Debbrah Alar, NP  Amino Acids-Protein Hydrolys (FEEDING SUPPLEMENT, PRO-STAT SUGAR FREE 64,) LIQD Take 30 mLs by mouth 3 (three) times daily with meals.   Yes [provider]  apixaban (ELIQUIS) 5 MG TABS tablet Take 1 tablet (5 mg total) by mouth 2 (two) times daily. 09/19/19  Yes Lelon Perla, MD  calcitRIOL (ROCALTROL) 0.5 MCG capsule Take 2 mcg by mouth daily.    Yes [provider]  Copper Gluconate (COPPER CAPS) 2 MG CAPS Take 1 tablet by mouth daily. 10/20/19  Yes Amin, Jeanella Flattery, MD  CRANBERRY CONCENTRATE PO Take 500 mg by mouth daily.    Yes [provider]  diclofenac sodium (VOLTAREN) 1 % GEL Apply 2 g topically 4 (four) times daily. Patient taking differently: Apply 2 g topically 4 (four) times daily as needed (pain).  05/05/18  Yes Debbrah Alar, NP  diltiazem (CARDIZEM CD) 120 MG  24 hr capsule Take 1 capsule (120 mg total) by mouth daily. 06/29/19 12/20/19 Yes Debbrah Alar, NP  EPINEPHrine (EPIPEN 2-PAK) 0.3 mg/0.3 mL IJ SOAJ injection Inject 0.3 mg into the muscle once as needed for anaphylaxis (severe allergic reaction).    Yes [provider]  feeding supplement (BOOST HIGH PROTEIN) LIQD Take 1 Container by mouth 2 (two) times daily between meals.   Yes [provider]  lactulose (CHRONULAC) 10 GM/15ML solution Take 30 mLs (20 g total) by mouth 3 (three) times daily. Titrate to 3 soft bowel movements daily. Patient taking differently: Take 20 g by mouth daily.  10/20/19  Yes Amin, Ankit Chirag, MD  levocetirizine (XYZAL) 5 MG tablet TAKE 1 TABLET BY MOUTH  EVERY EVENING Patient taking differently: Take 5 mg by mouth every morning.  08/18/18  Yes Debbrah Alar, NP  levothyroxine (SYNTHROID, LEVOTHROID) 100 MCG tablet Take  400 mcg by mouth daily before breakfast.    Yes [provider]  loperamide (IMODIUM) 2 MG capsule Take 2 mg by mouth 3 (three) times daily as needed for diarrhea or loose stools.   Yes [provider]  Melatonin 5 MG TABS Take 5 mg by mouth at bedtime.    Yes [provider]  midodrine (PROAMATINE) 2.5 MG tablet Take 2.5 mg by mouth 3 (three) times daily with meals.   Yes [provider]  midodrine (PROAMATINE) 5 MG tablet Take 5 mg by mouth 3 (three) times daily with meals.   Yes [provider]  Multiple Vitamins-Minerals (DECUBI-VITE) CAPS Take 1 capsule by mouth daily.   Yes [provider]  MYRBETRIQ 50 MG TB24 tablet Take 50 mg by mouth daily. 07/25/19  Yes [provider]  nystatin (MYCOSTATIN/NYSTOP) powder Apply topically 2 (two) times daily. Patient taking differently: Apply 1 application topically 2 (two) times daily as needed (irritation).  12/22/18  Yes Debbrah Alar, NP  nystatin cream (MYCOSTATIN) Apply 1 application topically 2 (two) times daily. 01/21/19  Yes Debbrah Alar, NP  omeprazole (PRILOSEC) 40 MG capsule Take 1 capsule (40 mg total) by mouth in the morning and at bedtime. 09/08/19  Yes Levin Erp, PA  ondansetron (ZOFRAN) 4 MG tablet Take 1 tablet (4 mg total) by mouth every 8 (eight) hours as needed for nausea or vomiting. 12/30/17  Yes Debbrah Alar, NP  PARoxetine (PAXIL) 10 MG tablet Take 10 mg by mouth daily.   Yes [provider]  potassium chloride SA (KLOR-CON) 20 MEQ tablet Take 20 mEq by mouth daily.   Yes [provider]  Probiotic Product (PROBIOTIC DAILY PO) Take 2 capsules by mouth in the morning, at noon, and at bedtime.   Yes [provider]  sodium bicarbonate 650 MG tablet Take 650 mg by mouth 3 (three) times daily.   Yes [provider]  SYMBICORT 160-4.5 MCG/ACT inhaler INHALE 2 PUFFS BY MOUTH TWO TIMES DAILY Patient taking  differently: Inhale 2 puffs into the lungs in the morning and at bedtime.  12/16/18  Yes Debbrah Alar, NP  Syringe/Needle, Disp, (SYRINGE 3CC/20GX1") 20G X 1" 3 ML MISC Use monthly as directed Patient taking differently: 1 each by Other route every 30 (thirty) days.  08/04/18  Yes Debbrah Alar, NP  traZODone (DESYREL) 50 MG tablet Take 50 mg by mouth at bedtime.   Yes [provider]  Vitamin D, Ergocalciferol, (DRISDOL) 1.25 MG (50000 UNIT) CAPS capsule TAKE 1 CAPSULE BY MOUTH  EVERY [place], [place],  [place]. Patient  taking differently: Take 50,000 Units by mouth 3 (three) times a week. TAKE 1 CAPSULE BY MOUTH  EVERY MONDAY, WEDNESDAY,  AND FRIDAY. 09/16/19  Yes Debbrah Alar, NP  zafirlukast (ACCOLATE) 20 MG tablet Take 1 tablet (20 mg total) by mouth 2 (two) times daily before a meal. 07/20/19  Yes Debbrah Alar, NP  cyanocobalamin (,VITAMIN B-12,) 1000 MCG/ML injection ADMINISTER 1ML (1,000 MCG) UNDER THE SKIN EVERY 30 DAYS Patient not taking: Reported on 12/20/2019 08/15/19   Debbrah Alar, NP  desoximetasone (TOPICORT) 0.05 % cream Apply topically 2 (two) times daily as needed. Patient not taking: Reported on 12/20/2019 01/29/17   Debbrah Alar, NP  furosemide (LASIX) 40 MG tablet Take 1 tablet (40 mg total) by mouth 2 (two) times daily. Patient not taking: Reported on 12/20/2019 06/17/19   Lelon Perla, MD  hydrocortisone cream 1 % Apply topically 4 (four) times daily as needed for itching. Patient not taking: Reported on 12/20/2019 10/24/18   Cherylann Ratel A, DO  senna-docusate (SENOKOT-S) 8.6-50 MG tablet Take 1 tablet by mouth at bedtime as needed for mild constipation. Patient not taking: Reported on 12/20/2019 10/20/19   Damita Lack, MD  simethicone (GAS-X) 80 MG chewable tablet Chew 1 tablet by mouth 4 (four) times daily as needed for flatulence.  Patient not taking: Reported on 12/20/2019    [provider]  spironolactone  (ALDACTONE) 25 MG tablet Take 1 tablet (25 mg total) by mouth daily. Patient not taking: Reported on 12/20/2019 07/20/19   Debbrah Alar, NP  sulfamethoxazole-trimethoprim (BACTRIM DS) 800-160 MG tablet Take 1 tablet by mouth 2 (two) times daily. Patient not taking: Reported on 12/20/2019    [provider]  traMADol (ULTRAM) 50 MG tablet Take 1 tablet (50 mg total) by mouth every 12 (twelve) hours as needed for moderate pain. TAKE 1 TABLET(50 MG) BY MOUTH EVERY 12 HOURS AS NEEDED Patient not taking: Reported on 12/20/2019 10/20/19   Damita Lack, MD  tuberculin (TUBERSOL) 5 UNIT/0.1ML injection Inject 0.1 mLs into the skin once. Patient not taking: Reported on 12/20/2019    [provider]    Critical care time: 35 min.    Montey Hora, [facility] 12/20/2019, 8:05 PM

## 2019-12-20 NOTE — ED Triage Notes (Signed)
Pt from [facility] for c.o left sided abd pain and generalized weakness for the past day. Pt hypotensive with ems 90/40, HR 50-60. Pt a.o, has been fully vaccinated for COVID.

## 2019-12-20 NOTE — ED Notes (Signed)
Per attending Pt is to receive 1 unit of blood only

## 2019-12-20 NOTE — Progress Notes (Signed)
Notified bedside nurse of need to administer fluid bolus, pt needs 2940 cc.  

## 2019-12-20 NOTE — Progress Notes (Signed)
Pharmacy Antibiotic Note  [name] is a 64 y.o. female admitted on 12/20/2019 with sepsis and UTI.  Pharmacy has been consulted for Cefepime dosing.      Temp (24hrs), Avg:93.3 F (34.1 C), Min:91.1 F (32.8 C), Max:95 F (35 C)  Recent Labs  Lab 12/20/19 1323 12/20/19 1735 12/20/19 1755  WBC 6.5  --  6.6  CREATININE 3.36*  --   --   LATICACIDVEN 0.7 1.4  --     CrCl cannot be calculated (Unknown ideal weight.).    Allergies  Allergen Reactions  . Other Anaphylaxis    Reaction to tree nuts  . Amoxicillin-Pot Clavulanate Other (See Comments)    headache  . Ciprofloxacin Nausea Only and Rash  . Food Hives and Other (See Comments)    Potato  . Keflex [Cephalexin] Rash  . Penicillins Rash    Headache. And hives - Dose not tolerate Cephalosporin either   Has patient had a PCN reaction causing immediate rash, facial/tongue/throat swelling, SOB or lightheadedness with hypotension: No Has patient had a PCN reaction causing severe rash involving mucus membranes or skin necrosis: No Has patient had a PCN reaction that required hospitalization: No Has patient had a PCN reaction occurring within the last 10 years: Yes (reaction was May 2020) If all of the above answers are "NO", then may proce  . Rocephin [Ceftriaxone] Rash  . Tomato Hives    Antimicrobials this admission: 7/27 Cefepime >>    Dose adjustments this admission: N/a  Microbiology results: Pending   Plan:  - Patient has significant amount of allergies with starting broad will use cefepime as she has tolerated before  - Cefepime 2g IV q24h  - Monitor patients renal function and urine output  - De-escalate ABX when appropriate   Thank you for allowing pharmacy to be a part of this patient's care.  Duanne Limerick PharmD. BCPS 12/20/2019 8:53 PM

## 2019-12-21 ENCOUNTER — Inpatient Hospital Stay (HOSPITAL_COMMUNITY): Payer: 59

## 2019-12-21 ENCOUNTER — Inpatient Hospital Stay: Payer: Self-pay

## 2019-12-21 DIAGNOSIS — L89150 Pressure ulcer of sacral region, unstageable: Secondary | ICD-10-CM | POA: Diagnosis present

## 2019-12-21 DIAGNOSIS — N179 Acute kidney failure, unspecified: Secondary | ICD-10-CM

## 2019-12-21 LAB — DIC (DISSEMINATED INTRAVASCULAR COAGULATION)PANEL
D-Dimer, Quant: 0.47 ug/mL-FEU (ref 0.00–0.50)
Fibrinogen: 568 mg/dL — ABNORMAL HIGH (ref 210–475)
INR: 2.3 — ABNORMAL HIGH (ref 0.8–1.2)
Platelets: DECREASED 10*3/uL (ref 150–400)
Prothrombin Time: 24.5 seconds — ABNORMAL HIGH (ref 11.4–15.2)
Smear Review: NONE SEEN
aPTT: 67 seconds — ABNORMAL HIGH (ref 24–36)

## 2019-12-21 LAB — GLUCOSE, CAPILLARY
Glucose-Capillary: 121 mg/dL — ABNORMAL HIGH (ref 70–99)
Glucose-Capillary: 46 mg/dL — ABNORMAL LOW (ref 70–99)
Glucose-Capillary: 57 mg/dL — ABNORMAL LOW (ref 70–99)
Glucose-Capillary: 87 mg/dL (ref 70–99)
Glucose-Capillary: 70 mg/dL (ref 70–99)
Glucose-Capillary: 78 mg/dL (ref 70–99)
Glucose-Capillary: 75 mg/dL (ref 70–99)
Glucose-Capillary: 71 mg/dL (ref 70–99)

## 2019-12-21 LAB — CBC
HCT: 30.3 % — ABNORMAL LOW (ref 36.0–46.0)
Hemoglobin: 9.5 g/dL — ABNORMAL LOW (ref 12.0–15.0)
MCH: 28.7 pg (ref 26.0–34.0)
MCHC: 31.4 g/dL (ref 30.0–36.0)
MCV: 91.5 fL (ref 80.0–100.0)
Platelets: 47 10*3/uL — ABNORMAL LOW (ref 150–400)
RBC: 3.31 MIL/uL — ABNORMAL LOW (ref 3.87–5.11)
RDW: 21.2 % — ABNORMAL HIGH (ref 11.5–15.5)
WBC: 9.8 10*3/uL (ref 4.0–10.5)
nRBC: 0.7 % — ABNORMAL HIGH (ref 0.0–0.2)

## 2019-12-21 LAB — BASIC METABOLIC PANEL
Anion gap: 11 (ref 5–15)
BUN: 53 mg/dL — ABNORMAL HIGH (ref 8–23)
CO2: 22 mmol/L (ref 22–32)
Calcium: 7.7 mg/dL — ABNORMAL LOW (ref 8.9–10.3)
Chloride: 105 mmol/L (ref 98–111)
Creatinine, Ser: 2.98 mg/dL — ABNORMAL HIGH (ref 0.44–1.00)
GFR calc Af Amer: 19 mL/min — ABNORMAL LOW (ref >60)
GFR calc non Af Amer: 16 mL/min — ABNORMAL LOW (ref >60)
Glucose, Bld: 55 mg/dL — ABNORMAL LOW (ref 70–99)
Potassium: 5.3 mmol/L — ABNORMAL HIGH (ref 3.5–5.1)
Sodium: 138 mmol/L (ref 135–145)

## 2019-12-21 LAB — COMPREHENSIVE METABOLIC PANEL
ALT: 67 U/L — ABNORMAL HIGH (ref 0–44)
AST: 78 U/L — ABNORMAL HIGH (ref 15–41)
Albumin: 1.7 g/dL — ABNORMAL LOW (ref 3.5–5.0)
Alkaline Phosphatase: 220 U/L — ABNORMAL HIGH (ref 38–126)
Anion gap: 11 (ref 5–15)
BUN: 52 mg/dL — ABNORMAL HIGH (ref 8–23)
CO2: 19 mmol/L — ABNORMAL LOW (ref 22–32)
Calcium: 7.5 mg/dL — ABNORMAL LOW (ref 8.9–10.3)
Chloride: 108 mmol/L (ref 98–111)
Creatinine, Ser: 3.01 mg/dL — ABNORMAL HIGH (ref 0.44–1.00)
GFR calc Af Amer: 18 mL/min — ABNORMAL LOW (ref >60)
GFR calc non Af Amer: 16 mL/min — ABNORMAL LOW (ref >60)
Glucose, Bld: 80 mg/dL (ref 70–99)
Potassium: 4.9 mmol/L (ref 3.5–5.1)
Sodium: 138 mmol/L (ref 135–145)
Total Bilirubin: 1 mg/dL (ref 0.3–1.2)
Total Protein: 4.8 g/dL — ABNORMAL LOW (ref 6.5–8.1)

## 2019-12-21 LAB — PROTIME-INR
INR: 3 — ABNORMAL HIGH (ref 0.8–1.2)
Prothrombin Time: 30.1 seconds — ABNORMAL HIGH (ref 11.4–15.2)

## 2019-12-21 LAB — URINE CULTURE

## 2019-12-21 LAB — CK: Total CK: 51 U/L (ref 38–234)

## 2019-12-21 LAB — MRSA PCR SCREENING: MRSA by PCR: POSITIVE — AB

## 2019-12-21 LAB — PHOSPHORUS: Phosphorus: 5.2 mg/dL — ABNORMAL HIGH (ref 2.5–4.6)

## 2019-12-21 LAB — MAGNESIUM: Magnesium: 2.6 mg/dL — ABNORMAL HIGH (ref 1.7–2.4)

## 2019-12-21 MED ORDER — DEXTROSE 50 % IV SOLN
INTRAVENOUS | Status: AC
  Filled 2019-12-21: qty 50

## 2019-12-21 MED ORDER — MORPHINE SULFATE (PF) 2 MG/ML IV SOLN
0.5000 mg | Freq: Once | INTRAVENOUS | Status: AC | PRN
  Administered 2019-12-21: 0.5 mg via INTRAVENOUS
  Filled 2019-12-21: qty 1

## 2019-12-21 MED ORDER — LACTATED RINGERS IV BOLUS
1000.0000 mL | Freq: Once | INTRAVENOUS | Status: AC
  Administered 2019-12-21: 1000 mL via INTRAVENOUS

## 2019-12-21 MED ORDER — DEXTROSE 50 % IV SOLN
12.5000 g | INTRAVENOUS | Status: AC
  Administered 2019-12-21: 12.5 g via INTRAVENOUS

## 2019-12-21 MED ORDER — CHLORHEXIDINE GLUCONATE CLOTH 2 % EX PADS
6.0000 | MEDICATED_PAD | Freq: Every day | CUTANEOUS | Status: DC
  Administered 2019-12-21 – 2019-12-27 (×8): 6 via TOPICAL

## 2019-12-21 MED ORDER — SODIUM CHLORIDE 0.9% FLUSH
10.0000 mL | Freq: Two times a day (BID) | INTRAVENOUS | Status: DC
  Administered 2019-12-21 – 2019-12-22 (×3): 10 mL
  Administered 2019-12-23: 20 mL
  Administered 2019-12-23 – 2019-12-24 (×2): 10 mL
  Administered 2019-12-24 – 2019-12-25 (×2): 30 mL
  Administered 2019-12-25: 10 mL
  Administered 2019-12-26: 20 mL
  Administered 2019-12-26 – 2019-12-27 (×2): 10 mL

## 2019-12-21 MED ORDER — ALBUMIN HUMAN 25 % IV SOLN
12.5000 g | Freq: Once | INTRAVENOUS | Status: AC
  Administered 2019-12-21: 12.5 g via INTRAVENOUS
  Filled 2019-12-21: qty 50

## 2019-12-21 MED ORDER — SODIUM CHLORIDE 0.9% FLUSH: 10.0000 mL | INTRAVENOUS | Status: DC | PRN

## 2019-12-21 MED ORDER — MUPIROCIN 2 % EX OINT
1.0000 "application " | TOPICAL_OINTMENT | Freq: Two times a day (BID) | CUTANEOUS | Status: AC
  Administered 2019-12-21 – 2019-12-25 (×11): 1 via NASAL
  Filled 2019-12-21 (×3): qty 22

## 2019-12-21 MED ORDER — VANCOMYCIN HCL 2000 MG/400ML IV SOLN
2000.0000 mg | Freq: Once | INTRAVENOUS | Status: AC
  Administered 2019-12-21: 2000 mg via INTRAVENOUS
  Filled 2019-12-21: qty 400

## 2019-12-21 MED ORDER — DEXTROSE IN LACTATED RINGERS 5 % IV SOLN: INTRAVENOUS | Status: DC

## 2019-12-21 MED ORDER — VANCOMYCIN HCL IN DEXTROSE 1-5 GM/200ML-% IV SOLN
1000.0000 mg | INTRAVENOUS | Status: DC
  Administered 2019-12-22 – 2019-12-23 (×2): 1000 mg via INTRAVENOUS
  Filled 2019-12-21 (×2): qty 200

## 2019-12-21 MED ORDER — WHITE PETROLATUM EX OINT
TOPICAL_OINTMENT | CUTANEOUS | Status: DC | PRN
  Administered 2019-12-21: 0.2 via TOPICAL
  Filled 2019-12-21: qty 28.35

## 2019-12-21 MED ORDER — DEXTROSE 50 % IV SOLN
25.0000 g | INTRAVENOUS | Status: AC
  Administered 2019-12-21: 25 g via INTRAVENOUS

## 2019-12-21 MED ORDER — COLLAGENASE 250 UNIT/GM EX OINT
TOPICAL_OINTMENT | Freq: Every day | CUTANEOUS | Status: DC
  Administered 2019-12-24: 1 via TOPICAL
  Filled 2019-12-21 (×3): qty 30

## 2019-12-21 MED ORDER — VECURONIUM BOLUS VIA INFUSION: 5.0000 mg | Freq: Once | INTRAVENOUS | Status: DC

## 2019-12-21 MED ORDER — ORAL CARE MOUTH RINSE
15.0000 mL | Freq: Two times a day (BID) | OROMUCOSAL | Status: DC
  Administered 2019-12-21 – 2019-12-27 (×13): 15 mL via OROMUCOSAL

## 2019-12-21 MED ORDER — ALBUMIN HUMAN 5 % IV SOLN
25.0000 g | Freq: Once | INTRAVENOUS | Status: AC
  Administered 2019-12-21: 25 g via INTRAVENOUS
  Filled 2019-12-21: qty 500

## 2019-12-21 NOTE — Progress Notes (Deleted)
eLink Physician-Brief Progress Note Patient Name: [name] DOB: [birth year] MRN: [id number]   Date of Service  12/21/2019  HPI/Events of Note  Request for restraints Patient is intubated and attempts to pull tubes and lines  eICU Interventions  Bilateral soft wrist restraints ordered        Judd Lien 12/21/2019, 4:52 AM

## 2019-12-21 NOTE — Progress Notes (Signed)
eLink Physician-Brief Progress Note Patient Name: [name] DOB: 11/09/1955 MRN: [id number]   Date of Service  12/21/2019  HPI/Events of Note  Notified that patient has chronic pains and usually takes Tramadol. Unable to take PO due to lethargy. Has midodrine and levothyroxine due as well.  eICU Interventions   Ordered a one time dose of morphine  May hold off on giving levothyroxine for now as long half life and last TSH 0.193  Already on norepinephrine     Intervention Category Intermediate Interventions: Pain - evaluation and management  Shona Needles Soul Hackman 12/21/2019, 3:27 AM

## 2019-12-21 NOTE — Progress Notes (Signed)
eLink Physician-Brief Progress Note Patient Name: [name] DOB: 1956-01-24 MRN: [id number]   Date of Service  12/21/2019  HPI/Events of Note  Notified of MAP <65 on norepinephrine maximum for peripheral access  BP 109/40  HR 85  eICU Interventions  Ordered albumin 25% If remains low will need central line     Intervention Category Major Interventions: Hypotension - evaluation and management  Judd Lien 12/21/2019, 6:14 AM

## 2019-12-21 NOTE — Progress Notes (Signed)
Pharmacy Antibiotic Note  [name] is a 64 y.o. female admitted on 12/20/2019 with sepsis and UTI.  Pharmacy has been consulted for Cefepime and vancomycin dosing.  Currently afebrile, wbc normal at 9.8. Scr elevated at 3.0. Patient started on cefepime last night, will broaden with vancomycin today.   Weight: (!) 106.9 kg (235 lb 10.8 oz)  Temp (24hrs), Avg:97.3 F (36.3 C), Min:92.1 F (33.4 C), Max:98.6 F (37 C)  Recent Labs  Lab 12/20/19 1323 12/20/19 1735 12/20/19 1755 12/21/19 0230 12/21/19 1018  WBC 6.5  --  6.6 9.8  --   CREATININE 3.36*  --   --  2.98* 3.01*  LATICACIDVEN 0.7 1.4  --   --   --     Estimated Creatinine Clearance: 23.1 mL/min (A) (by C-G formula based on SCr of 3.01 mg/dL (H)).    Allergies  Allergen Reactions  . Other Anaphylaxis    Reaction to tree nuts  . Amoxicillin-Pot Clavulanate Other (See Comments)    headache  . Ciprofloxacin Nausea Only and Rash  . Food Hives and Other (See Comments)    Potato  . Keflex [Cephalexin] Rash  . Penicillins Rash    Headache. And hives - Dose not tolerate Cephalosporin either   Has patient had a PCN reaction causing immediate rash, facial/tongue/throat swelling, SOB or lightheadedness with hypotension: No Has patient had a PCN reaction causing severe rash involving mucus membranes or skin necrosis: No Has patient had a PCN reaction that required hospitalization: No Has patient had a PCN reaction occurring within the last 10 years: Yes (reaction was May 2020) If all of the above answers are "NO", then may proce  . Rocephin [Ceftriaxone] Rash  . Tomato Hives    Antimicrobials this admission: Cefepime 7/27>>  Vancomycin 7/28>>  Dose adjustments this admission: N/a  Microbiology results: 7/27 urine - mult sp. MRSA Pcr +   Plan:  - Cefepime 2g IV q24h  - Load vancomycin 2g IV now then 1g IV q24 hours - Monitor patients renal function and urine output  - De-escalate ABX when appropriate    Thank you for allowing pharmacy to be a part of this patient's care.  Erin Hearing PharmD., BCPS Clinical Pharmacist 12/21/2019 3:35 PM

## 2019-12-21 NOTE — Progress Notes (Signed)
[facility] Progress Note Patient Name: ZAIAH CREDEUR DOB: 08-14-1955 MRN: [id number]   Date of Service  12/21/2019  HPI/Events of Note  Glucose 46  eICU Interventions  Switched LR to D5LR      Intervention Category Major Interventions: Other:  Judd Lien 12/21/2019, 4:45 AM

## 2019-12-21 NOTE — Progress Notes (Signed)
Peripherally Inserted Central Catheter Placement  The IV Nurse has discussed with the patient and/or persons authorized to consent for the patient, the purpose of this procedure and the potential benefits and risks involved with this procedure.  The benefits include less needle sticks, lab draws from the catheter, and the patient may be discharged home with the catheter. Risks include, but not limited to, infection, bleeding, blood clot (thrombus formation), and puncture of an artery; nerve damage and irregular heartbeat and possibility to perform a PICC exchange if needed/ordered by physician.  Alternatives to this procedure were also discussed.  Bard Power PICC patient education guide, fact sheet on infection prevention and patient information card has been provided to patient /or left at bedside.  Consent obtained with son at bedside    PICC Placement Documentation  PICC Double Lumen 48/88/91 PICC Right Basilic 41 cm 0 cm (Active)  Indication for Insertion or Continuance of Line Poor Vasculature-patient has had multiple peripheral attempts or PIVs lasting less than 24 hours 12/21/19 1400  Exposed Catheter (cm) 0 cm 12/21/19 1400  Site Assessment Clean;Dry;Intact 12/21/19 1400  Lumen #1 Status Flushed;Saline locked;Blood return noted 12/21/19 1400  Lumen #2 Status Flushed;Saline locked;Blood return noted 12/21/19 1400  Dressing Type Transparent;Securing device 12/21/19 1400  Dressing Status Clean;Dry;Intact;Antimicrobial disc in place 12/21/19 1400  Dressing Change Due 12/28/19 12/21/19 1400       Holley Bouche Neche 12/21/2019, 2:42 PM

## 2019-12-21 NOTE — Consult Note (Addendum)
[facility] Nurse Consult Note: Reason for Consult: Consult requested for sacrum, buttocks, and abd and breast skin folds.  Pt is critically ill an abnormal INR and multiple systemic factors which can impair healing. Wound type: Sacrum with 3X3cm area of dark purple deep tissue injury locared in a valley; this is evolving into an unstageable pressure injury.  Area surrounding the valley/wound is red moist and macerated with patchy areas of full thickness skin loss related to moisture associated skin damage; approx 10X10X.2cm, small amt bloody drainage when touched.  Skin folds to breasts and abd and groin are red and moist with partial thickness fissures; appearance is consistent with intertrigo.  Pressure Injury POA: Yes  Dressing procedure/placement/frequency: Topical treatment orders provided for bedside nurses to perform as follows to provide enzymatic debridement of nonviable tissue: Apply Santyl to sacrum wound Q day, then cover with moist 2X2 and foam dressing, and cover buttocks wounds with foam dressing.  (Change foam dressing Q 3 days or PRN soiling.) Pt is on a low airloss mattress to decrease pressure.  Measure and cut length of InterDry to fit in skin folds that have skin breakdown  Tuck InterDry fabric into skin folds in a single layer, allow for 2 inches of overhang from skin edges to allow for wicking to occur May remove to bathe; dry area thoroughly and then tuck into affected areas again  Do not apply any creams or ointments when using InterDry DO NOT THROW AWAY FOR 5 DAYS unless soiled with stool DO NOT [facility] product, this will inactivate the silver in the material  New sheet of Interdry should be applied after 5 days of use if patient continues to have skin breakdown  Discontinue use of current sheet after 8/1  Please re-consult if further assistance is needed.  Thank-you,  Julien Girt MSN, [place], [place], [place], [place]

## 2019-12-21 NOTE — Progress Notes (Signed)
eLink Physician-Brief Progress Note Patient Name: SHEVAWN LANGENBERG DOB: 07/03/1955 MRN: [id number]   Date of Service  12/21/2019  HPI/Events of Note  49 F PAF on apixaban, recurrent UTI, chronic debility presented from rehab with abdominal pain and generalized weakness. Also with altered sensorium. Found to be hypotensive, hypothermic, UTI on workup with INR > 10  eICU Interventions   Now on pressors after 5 liters crystalloids and albumin  Continue antibiotics but follow cultures as high risk for MDR organism  Supratherapeutic INR, eliquis on hold, correction ongoing     Intervention Category Major Interventions: Shock - evaluation and management;Sepsis - evaluation and management Evaluation Type: New Patient Evaluation  Judd Lien 12/21/2019, 2:02 AM

## 2019-12-21 NOTE — Plan of Care (Signed)
Admitted for UTI sepsis, foley with bloody urine (supratherapeutic INR). Lethargic, slightly confused but follows simple commands.   Levo gtt for BP.   Multiple skin issues, pressure ulcers/ecchymosis/weeping. Wound consult placed.    Problem: Fluid Volume: Goal: Hemodynamic stability will improve 12/21/2019 0310 by Clerance Lav, RN Outcome: Progressing 12/21/2019 0309 by Clerance Lav, RN Outcome: Progressing   Problem: Clinical Measurements: Goal: Diagnostic test results will improve 12/21/2019 0310 by Clerance Lav, RN Outcome: Progressing 12/21/2019 0309 by Clerance Lav, RN Outcome: Progressing Goal: Signs and symptoms of infection will decrease 12/21/2019 0310 by Clerance Lav, RN Outcome: Progressing 12/21/2019 0309 by Clerance Lav, RN Outcome: Progressing   Problem: Respiratory: Goal: Ability to maintain adequate ventilation will improve 12/21/2019 0310 by Clerance Lav, RN Outcome: Progressing 12/21/2019 0309 by Clerance Lav, RN Outcome: Progressing

## 2019-12-21 NOTE — Progress Notes (Addendum)
NAME:  [name], MRN:  [id number], DOB:  03-Jun-1955, LOS: 1 ADMISSION DATE:  12/20/2019, CONSULTATION DATE:  12/20/19 REFERRING MD:  Francia Greaves  CHIEF COMPLAINT:  abd pain, weakness  Brief History   [name] is a 64 y.o. female who was admitted 7/27 with urosepsis.  History of present illness   [name] is a 65 y.o. female who has a PMH as outlined below.  She presented to [facility] ED 7/27 from rehab due to left sided abdominal discomfort and generalized weakness.  She had recent admission 10/14/19 through 10/22/19 for sepsis 2/2 E.coli UTI.  She was discharged to rehab facility after that admission for ongoing rehab.  Per brother, over the past 2 weeks or so, pt has not been able to stand at all.  She has basically been bedridden.  On 7/27, she wasn't able to state his name and sounded confused.  Brother felt she likely had recurrent UTI's due to her behaving similarly in the past with prior UTI's.  She was also borderline hypotensive.  In ED, UA confirmed UTI.  She also had multiple metabolic derangements as well as anemia with significant coagulopathy (INR > 10).  She was given 10mg  vitamin K and has 1u PRBC ordered.  Besides bloody urine, there has been no obvious bleeding.  Past Medical History  has Hypothyroidism; Vitamin D deficiency; Morbid obesity ([place]); Iron deficiency anemia secondary to malabsorption ; Anemia; Leukocytosis; Essential hypertension; Rhinosinusitis, recurrent; Asthma; GERD (gastroesophageal reflux disease); Hypoparathyroidism ([place]); PAF (paroxysmal atrial fibrillation); Low back pain; Hx of papillary thyroid carcinoma; Nonspecific abnormal unspecified cardiovascular function study; Weight gain; Mid back pain; Fatty liver; Muscle cramps; Trochanteric bursitis of left hip; Endometrial adenocarcinoma ([place]); Foot pain; Edema; Reflux esophagitis; Pernicious anemia; Osteomyelitis ([place]); Right ureteral stone; Recurrent falls; Hypokalemia; Hypocalcemia; Sepsis ([place]);  Sepsis due to Escherichia coli (E. coli) ([place]); E coli bacteremia; Pyelonephritis; Pressure injury of skin; UTI (urinary tract infection); Chronic anticoagulation; Abnormal nuclear stress test; Debilitated patient; Mild neurocognitive disorder; Major depressive disorder; Generalized anxiety disorder; Hypothermia; Thrombocytopenia ([place]); AKI (acute kidney injury) ([place]); and Knee pain on their problem list.  Significant Hospital Events   7/27 > admit. 7/28> increasing pressor requirement despite fluid resuscitation  Consults:  None.  Procedures:  None.  Significant Diagnostic Tests:  CT abd / pelv 7/27 > no hemorrhage, third spacing, bilateral non-obstructing renal calculi, hepatosplenomegaly. CT head 7/27 > neg.  Micro Data:  COVID 7/27 > neg. Blood 7/27 >  Urine 7/27 >  MRSA 7/28> pos  Antimicrobials:  Cefepime 7/27 >    Interim history/subjective:  On 10 NE peripherally despite aggressive volume resusc Received 1 PRBC overnight for hgb < 7, follow up is hgb > 9 Plt remain low, but have recovered from 25 to 47  Hypoglycemic overnight requiring D5Lr gtt   Objective:  Blood pressure (!) 91/43, pulse 88, temperature 98.4 F (36.9 C), resp. rate 13, weight (!) 106.9 kg, last menstrual period 06/11/2012, SpO2 96 %.        Intake/Output Summary (Last 24 hours) at 12/21/2019 0832 Last data filed at 12/21/2019 0636 Gross per 24 hour  Intake 965.95 ml  Output 1550 ml  Net -584.05 ml   Filed Weights   12/21/19 0240  Weight: (!) 106.9 kg    Examination: General: Adult chronically and critically ill F, appears older than stated age. Reclined in bed, encephalopathic NAD  Neuro: Awakens to voice, weakly follows commands. Somnolent  HEENT: NCAt anicteric sclera. Pink tacky mm.  Trachea midline  Cardiovascular: RRR, no M/R/G.  Lungs: Even unlabored respirations. CTA.  Abdomen: Obese soft ndnt  Musculoskeletal: No gross deformities, 1+ edema to knees. Skin: Diffuse ecchymosis  and scattered maceration BUE. Warm, clean  GU:  Bloody urine collecting in foley bag   Assessment & Plan:   Acute encephalopathy -in setting of septic shock  P -delirium precautions   Septic shock 2/2 presumed UTI -- numerous E. Coli UTI infections in past with severe urosepsis P -Continue cefepime  -Peripheral pressors for SBP > 90  -continue IVF, albumin.  -Midodrine if mentation allows  -With pressor requirement increasing, likely need to consider CVC placement. Need to further discuss this with family   Coagulopathy  -s/p Vit K  Thrombocytopenia. -likely in setting of critical illness Anemia, improved  -hematuria, no obvious bleeding otherwise  -s/p 1 PRBC  P - STAT DIC panel  -Check LFTs   Hypoglycemia P -continue D5 fluids -checking liver function  AKI Hyperkalemia, mild Hematuria Non-obstructing renal calculi on CT  P -continue IVF -trend renal indices  -follow up K   Hx PAF (on eliquis), dCHF. - Hold home eliquis for now. - Hold home diltiazem, furosemide, spironolactone.  Hx hypothyroidism. - Continue home synthroid.  Hx COPD. - BD's.  Sacral wound present on admission P -WOCN  Goals of Care -DNR/DNI -need to discuss [place] with decision maker -- pt brother.  -I am concerned that patient has worsening shock and hematologic abnormalities. Awaiting further labs to assess end organ function.  Best Practice:  Diet: NPO. Pain/Anxiety/Delirium protocol (if indicated): N/A. VAP protocol (if indicated): N/A. DVT prophylaxis: SCD's only. GI prophylaxis: None. Glucose control: None. Mobility: Bedrest. Code Status: DNR. Family Communication: Discussed with sister-in-law at bedside. Brother is Media planner, is asleep currently.  Disposition: ICU.  Labs   CBC: Recent Labs  Lab 12/20/19 1323 12/20/19 1755 12/21/19 0230  WBC 6.5 6.6 9.8  NEUTROABS 5.9  --   --   HGB 8.0* 6.7* 9.5*  HCT 26.3* 22.5* 30.3*  MCV 93.3 93.0 91.5  PLT PLATELET  CLUMPS NOTED ON SMEAR, COUNT APPEARS DECREASED 25* 47*   Basic Metabolic Panel: Recent Labs  Lab 12/20/19 1323 12/21/19 0230  NA 135 138  K 5.3* 5.3*  CL 101 105  CO2 20* 22  GLUCOSE 59* 55*  BUN 59* 53*  CREATININE 3.36* 2.98*  CALCIUM 7.6* 7.7*  MG  --  2.6*  PHOS  --  5.2*   GFR: Estimated Creatinine Clearance: 23.4 mL/min (A) (by C-G formula based on SCr of 2.98 mg/dL (H)). Recent Labs  Lab 12/20/19 1323 12/20/19 1735 12/20/19 1755 12/21/19 0230  WBC 6.5  --  6.6 9.8  LATICACIDVEN 0.7 1.4  --   --    Liver Function Tests: Recent Labs  Lab 12/20/19 1323  AST 113*  ALT 91*  ALKPHOS 250*  BILITOT 1.0  PROT 4.8*  ALBUMIN 1.4*   No results for input(s): LIPASE, AMYLASE in the last 168 hours. No results for input(s): AMMONIA in the last 168 hours. ABG    Component Value Date/Time   PHART 7.445 10/17/2019 2015   PCO2ART 29.0 (L) 10/17/2019 2015   PO2ART 106 10/17/2019 2015   HCO3 19.7 (L) 10/17/2019 2015   TCO2 21 (L) 10/14/2019 1338   ACIDBASEDEF 3.8 (H) 10/17/2019 2015   O2SAT 95.9 10/17/2019 2015    Coagulation Profile: Recent Labs  Lab 12/20/19 1434 12/21/19 0230  INR >10.0* 3.0*   Cardiac Enzymes: No results for  input(s): CKTOTAL, CKMB, CKMBINDEX, TROPONINI in the last 168 hours. HbA1C: No results found for: HGBA1C CBG: Recent Labs  Lab 12/21/19 0618 12/21/19 0621 12/21/19 0623 12/21/19 0652 12/21/19 0817  GLUCAP >600* 57* 59* 87 70     CRITICAL CARE Performed by: Cristal Generous   Total critical care time: 40 minutes  Critical care time was exclusive of separately billable procedures and treating other patients.  Critical care was necessary to treat or prevent imminent or life-threatening deterioration.  Critical care was time spent personally by me on the following activities: development of treatment plan with patient and/or surrogate as well as nursing, discussions with consultants, evaluation of patient's response to  treatment, examination of patient, obtaining history from patient or surrogate, ordering and performing treatments and interventions, ordering and review of laboratory studies, ordering and review of radiographic studies, pulse oximetry and re-evaluation of patient's condition.  Eliseo Gum MSN, AGACNP-BC [place] [phone] If no answer, [phone] 12/21/2019, 8:33 AM

## 2019-12-21 NOTE — Progress Notes (Deleted)
eLink Physician-Brief Progress Note Patient Name: [name] DOB: 1955-11-18 MRN: [id number]   Date of Service  12/21/2019  HPI/Events of Note  Notified of vent desynchrony, taken off vecuronium during the day. Fentanyl increased to 300 from 225 and Versed now at 8 from 3 mg. RASS -4 to -5  eICU Interventions  Ordered vecuronium 5 mg IV x 1 CXR and ABG     Intervention Category Major Interventions: Respiratory failure - evaluation and management  Judd Lien 12/21/2019, 3:55 AM

## 2019-12-21 NOTE — Progress Notes (Signed)
CRITICAL VALUE STICKER  Glucose = 55 on am panel, bedside check = 46. Pt drowsy but responds to verbal and follows simple commands.   1 amp Dextrose given per hypoglycemia orders.   15 min recheck = 121.   Called eLink to notify, Pt NPO, anticipate change in IVF to include dextrose, awaiting orders

## 2019-12-22 ENCOUNTER — Inpatient Hospital Stay (HOSPITAL_COMMUNITY): Payer: 59

## 2019-12-22 ENCOUNTER — Ambulatory Visit: Payer: 59 | Admitting: Family

## 2019-12-22 DIAGNOSIS — N179 Acute kidney failure, unspecified: Secondary | ICD-10-CM | POA: Diagnosis not present

## 2019-12-22 LAB — CBC WITH DIFFERENTIAL/PLATELET
Abs Immature Granulocytes: 0.22 10*3/uL — ABNORMAL HIGH (ref 0.00–0.07)
Basophils Absolute: 0 10*3/uL (ref 0.0–0.1)
Basophils Relative: 1 %
Eosinophils Absolute: 0.1 10*3/uL (ref 0.0–0.5)
Eosinophils Relative: 1 %
HCT: 23.5 % — ABNORMAL LOW (ref 36.0–46.0)
Hemoglobin: 7.2 g/dL — ABNORMAL LOW (ref 12.0–15.0)
Immature Granulocytes: 3 %
Lymphocytes Relative: 9 %
Lymphs Abs: 0.7 10*3/uL (ref 0.7–4.0)
MCH: 28.9 pg (ref 26.0–34.0)
MCHC: 30.6 g/dL (ref 30.0–36.0)
MCV: 94.4 fL (ref 80.0–100.0)
Monocytes Absolute: 0.6 10*3/uL (ref 0.1–1.0)
Monocytes Relative: 8 %
Neutro Abs: 5.8 10*3/uL (ref 1.7–7.7)
Neutrophils Relative %: 78 %
Platelets: 34 10*3/uL — ABNORMAL LOW (ref 150–400)
RBC: 2.49 MIL/uL — ABNORMAL LOW (ref 3.87–5.11)
RDW: 21.4 % — ABNORMAL HIGH (ref 11.5–15.5)
Smear Review: DECREASED
WBC: 7.4 10*3/uL (ref 4.0–10.5)
nRBC: 0.4 % — ABNORMAL HIGH (ref 0.0–0.2)

## 2019-12-22 LAB — GLUCOSE, CAPILLARY
Glucose-Capillary: 105 mg/dL — ABNORMAL HIGH (ref 70–99)
Glucose-Capillary: 59 mg/dL — ABNORMAL LOW (ref 70–99)
Glucose-Capillary: 76 mg/dL (ref 70–99)
Glucose-Capillary: 80 mg/dL (ref 70–99)
Glucose-Capillary: 81 mg/dL (ref 70–99)
Glucose-Capillary: 85 mg/dL (ref 70–99)
Glucose-Capillary: 90 mg/dL (ref 70–99)
Glucose-Capillary: >600 mg/dL (ref 70–99)

## 2019-12-22 LAB — LACTATE DEHYDROGENASE: LDH: 141 U/L (ref 98–192)

## 2019-12-22 LAB — RENAL FUNCTION PANEL
Albumin: 1.7 g/dL — ABNORMAL LOW (ref 3.5–5.0)
Anion gap: 12 (ref 5–15)
BUN: 43 mg/dL — ABNORMAL HIGH (ref 8–23)
CO2: 19 mmol/L — ABNORMAL LOW (ref 22–32)
Calcium: 7.3 mg/dL — ABNORMAL LOW (ref 8.9–10.3)
Chloride: 110 mmol/L (ref 98–111)
Creatinine, Ser: 2.57 mg/dL — ABNORMAL HIGH (ref 0.44–1.00)
GFR calc Af Amer: 22 mL/min — ABNORMAL LOW (ref >60)
GFR calc non Af Amer: 19 mL/min — ABNORMAL LOW (ref >60)
Glucose, Bld: 314 mg/dL — ABNORMAL HIGH (ref 70–99)
Phosphorus: 4 mg/dL (ref 2.5–4.6)
Potassium: 4.1 mmol/L (ref 3.5–5.1)
Sodium: 141 mmol/L (ref 135–145)

## 2019-12-22 LAB — PROTIME-INR
INR: 1.7 — ABNORMAL HIGH (ref 0.8–1.2)
Prothrombin Time: 19.4 seconds — ABNORMAL HIGH (ref 11.4–15.2)

## 2019-12-22 LAB — MAGNESIUM: Magnesium: 2 mg/dL (ref 1.7–2.4)

## 2019-12-22 MED ORDER — RESOURCE THICKENUP CLEAR PO POWD
ORAL | Status: DC | PRN
  Filled 2019-12-22: qty 125

## 2019-12-22 MED ORDER — MORPHINE SULFATE (PF) 2 MG/ML IV SOLN
0.5000 mg | INTRAVENOUS | Status: DC | PRN
  Administered 2019-12-22 – 2019-12-24 (×4): 0.5 mg via INTRAVENOUS
  Filled 2019-12-22 (×4): qty 1

## 2019-12-22 MED ORDER — MORPHINE SULFATE (PF) 2 MG/ML IV SOLN
0.5000 mg | Freq: Once | INTRAVENOUS | Status: AC
  Administered 2019-12-22: 0.5 mg via INTRAVENOUS
  Filled 2019-12-22: qty 1

## 2019-12-22 MED ORDER — SODIUM CHLORIDE 0.9 % IV BOLUS
500.0000 mL | Freq: Once | INTRAVENOUS | Status: AC
  Administered 2019-12-22: 500 mL via INTRAVENOUS

## 2019-12-22 MED ORDER — NOREPINEPHRINE 16 MG/250ML-% IV SOLN
0.0000 ug/min | INTRAVENOUS | Status: DC
  Administered 2019-12-22: 12 ug/min via INTRAVENOUS
  Filled 2019-12-22: qty 250

## 2019-12-22 MED ORDER — STARCH (THICKENING) PO POWD: ORAL | Status: DC | PRN

## 2019-12-22 NOTE — Progress Notes (Addendum)
Initial Nutrition Assessment  DOCUMENTATION CODES:   Non-severe (moderate) malnutrition in context of chronic illness  INTERVENTION:   Pending GOC Discussion, recommendations outlined below if desire aggressive nutrition intervention:  Add MVI with Minerals BID  Add Calcium Carbonate 500 mg TID  Given pt's high risk for deficiencies post bariatric surgery, poor po intake, wound, signs/symptoms associated with vit deficiencies, recommend checking the following labs: Vit C, Zinc, Vit A, Copper, Selenium, Vit E, Thiamine, B6  Consider Cortrak placement with initiation of TF  NUTRITION DIAGNOSIS:   Moderate Malnutrition related to chronic illness as evidenced by mild fat depletion, moderate muscle depletion, edema, energy intake < 75% for > or equal to 1 month.  GOAL:   Patient will meet greater than or equal to 90% of their needs  MONITOR:   Diet advancement, PO intake, Labs, Weight trends  REASON FOR ASSESSMENT:   Rounds Wound healing  ASSESSMENT:   64 yo female admitted with acute encephalopathy with septic shock, AKI. Recent admission for sepsis secondary to E.coli UTI and discharged to rehab. PMH includes gastric bypass, hx of papillary thyroid carcinoma s/p thryoidectomy, Vit D deficiency, iron def anemia, HTN, GERD, recurrent falls, hypokalemia, hypocalcemia, pressure injuries, toe amputation, mild neurocognitive disorder, COPD  Currently on levophed  Pt awake on visit today but does not converse with this RD on visit. Most history is obtained from pt's brother. Pt appears very weak.   NPO at present, SLP plans to perform MBS later today. Family indicating that pt has been having trouble swallowing bread. If she gets a sandwich, she just eats the meat out of it and not the bread.  Noted tongue very smooth, pale in color. Also noted sores, scabs in pt mouth  Pt has not been eating well over the last several weeks;  Prior to hospitalization in May, pt eating 2 meals  per day, no snacks and no oral nutrition supplements. Pt likes to eat [employer], [employer], etc but was eating small portions. Family also indicates that pt recently shared with them that she had not been eating when home alone because she did not want have to go to the bathroom; pt reported she would only eat when they were present.  Pt has not been taking bariatric MVIs  Current weight 106.9 kg; pt with significant LE edema on exam. Unsure of dry weight. Family indicating weight loss 60 pounds  Pt evaluated by [place] RN with multiple wounds. DTI to sacrum evoloving into unstageable PI. Skin folds to breast, abdomen and groin; moist with partial thickness fissures consistent with intertrigo. Family reports pt has had wounds of various degrees for a while now  Noted brother indicating that pt has not been able to stand at all over the past 2 weeks, basically bedridden. Pt had previously been limiting po intake, no eating so she did not have to get up to go to bathroom  Noted pt with hx of gastric bypass and with hx of multiple vitamin deficiencies. Pt with hx of iron def anemia, B-12 deficiency, Vit D deficiency. Copper deficient as well based on recent labs but not reported in pt history  Vitamin/MIneral Profile (09/2019):  Thiamine B1: no recent lab Vitamin B12: 1499 (H)  Folate B9: 8.9 (wdl) Vitamin A: no recent lab Vitamin D1: 20 (wdl) Vitamin D2: 9 (wdl) Vitamin D3: 61 (wdl) Vitamin C: no lab Copper (10/16/19):  31 (L) Selenium: no recent lab Zinc: no recent lab  Copper was low in May; per pt's brother,  copper was being supplemented at [facility] but do not have any available labs post supplementation. Plan to recheck   Pt meets clinical characteristics for moderate malnutrition based on physical exam and information provided, although pt may very well be severely malnourished. Continue to assess  Labs: Creatinine 2.57, BUN 43, corrected calcium, albumin 1.7, corrected calcium 9.1, CBGs  70-81 Meds: miralax prn, D5-LR at 5- ml/hr  NUTRITION - FOCUSED PHYSICAL EXAM:    Most Recent Value  Orbital Region Mild depletion  Upper Arm Region No depletion  Thoracic and Lumbar Region No depletion  Buccal Region Mild depletion  Temple Region Moderate depletion  Clavicle Bone Region Moderate depletion  Clavicle and Acromion Bone Region Moderate depletion  Scapular Bone Region Moderate depletion  Dorsal Hand Unable to assess  Patellar Region Unable to assess  Anterior Thigh Region Unable to assess  Posterior Calf Region Unable to assess  Edema (RD Assessment) Severe  Hair --  [easily pluckable, reports hair loss]  Eyes Reviewed  Mouth Other (Comment)  [sores with scabs on tongue and roof of mouth, smooth tongue]  Skin Other (Comment)  [brusing on arms, chest, non-healing wounds]  Nails Other (Comment)  [brittle]       Diet Order:   Diet Order            Diet NPO time specified  Diet effective now                 EDUCATION NEEDS:   Not appropriate for education at this time  Skin:  Skin Assessment: Skin Integrity Issues: Skin Integrity Issues:: Other (Comment), Unstageable Unstageable: Sacrum-DTI evolving into unstageable (WOC RN following) Other: Eccymosis all over including on chest, arms, hands, legs, shoulder; MASD; Skin folds of breast, abdomen and groin with partial thickness wounds consistent with intertrigo  Last BM:  no documented BM  Height:   Ht Readings from Last 1 Encounters:  11/14/19 5\' 4"  (1.626 m)    Weight:   Wt Readings from Last 1 Encounters:  12/21/19 (!) 106.9 kg    Ideal Body Weight:  54.5 kg  BMI:  Body mass index is 40.45 kg/m.  Estimated Nutritional Needs:   Kcal:  2000-2300 kcals  Protein:  110-140 g  Fluid:  >/= 2L    Kerman Passey MS, RDN, LDN, CNSC Registered Dietitian III Clinical Nutrition RD Pager and On-Call Pager Number Located in [place]

## 2019-12-22 NOTE — Progress Notes (Signed)
Chaplain engaged in initial visit with [name] and offered prayer.  [name] voiced that she wanted to pray about "making it through this."  Chaplain prayed with [name] and offered support.  Chaplain will follow-up.

## 2019-12-22 NOTE — Progress Notes (Signed)
eLink Physician-Brief Progress Note Patient Name: [name] DOB: [birth year] MRN: [id number]   Date of Service  12/22/2019  HPI/Events of Note  Pt is on voltaren 1% Gel topically 4 times daily for neck pain at home.  RN asks you to reorder this home med  But had PRBC for anemia.  eICU Interventions  So would avoid this for tonight.      Intervention Category Intermediate Interventions: Pain - evaluation and management  Elmer Sow 12/22/2019, 12:38 AM

## 2019-12-22 NOTE — Evaluation (Addendum)
Clinical/Bedside Swallow Evaluation Patient Details  Name: [name] MRN: [id number] Date of Birth: 11-08-1955  Today's Date: 12/22/2019 Time: SLP Start Time (ACUTE ONLY): 8182 SLP Stop Time (ACUTE ONLY): 0921 SLP Time Calculation (min) (ACUTE ONLY): 23 min  Past Medical History:  Past Medical History:  Diagnosis Date  . Abnormal Pap smear    years ago/no biopsy  . Allergic rhinitis   . Aortic atherosclerosis   . Arthritis    back- lower  . Asthma   . Atrial fibrillation 12/2010   OFF XARELTO LAST MONTH DUE TO BLEEDING IN STOOL  . Bursitis of hip left  . Chronic anticoagulation 12/16/2018   CHADS VASC=2 for sex and H/O HTN- she is on Eliquis  . Chronic diastolic (congestive) heart failure    pt unaware of this  . COPD (chronic obstructive pulmonary disease) with chronic bronchitis   . Decreased pulses in feet   . Dysrhythmia    afib, followed by Dr. Stanford Breed   . Edema of both legs   . Endometrial adenocarcinoma 02/21/2013   S/p D and C, has mirena, being followed by GYN   . Esophagitis 02/02/2014   Distal, linear erosions, noted on endoscopy  . Essential hypertension 01/05/2007   Echo Nov June 2020- EF 50-55%, mild LVH, normal LA   . Fatty liver 01/07/2012  . Fibroid 1974   fibroid cyst on left fallopian tube  . Fibromyalgia   . Foot ulcer    AREA HEALED RIGHT FOOT  . Generalized anxiety disorder   . GERD (gastroesophageal reflux disease)   . Headache    occasional sinus headache   . Hepatomegaly   . History of blood transfusion JULY 2015  . History of cardiomegaly 12/06/2010   Noted on CT  . History of E. coli septicemia   . History of papillary thyroid carcinoma 04/07/2011  . Hypocalcemia 12/10/2016  . Hypokalemia 12/10/2016  . Hypoparathyroidism 01/07/2011  . Hypothyroidism   . Iron deficiency anemia secondary to malabsorption  05/10/2007   Qualifier: Diagnosis of  By: Wynona Luna    . Kidney stone 28-Sep-2015   passed on their own  . Leukocytosis  03/24/2009   cta cheQualifier: Diagnosis of  By: Wynona Luna    . Major depressive disorder   . Mild neurocognitive disorder 06/28/2019  . Morbid obesity   . MRSA infection   . Nephrolithiasis 2/16, 9/16   SEES DR Risa Grill  . Osteomyelitis 06/14/2014  . PAF (paroxysmal atrial fibrillation) 02/05/2011   Documented 2012- NSR since.  On chronic anticoagulation.  . Pernicious anemia 02/20/2014   followed by Debbrah Alar  . Pneumonia   . PONV (postoperative nausea and vomiting)   . Pyelonephritis 11/24/2018  . Rash    thighs and back  . Seizures    infancy secondary to fever  . Staph aureus infection   . Thoracic spondylosis 12/06/2010   Noted on CT  . Uterine cancer 2014   Mirena IUD  . UTI (urinary tract infection) 12/13/2018  . Vitamin B 12 deficiency   . Vitamin D deficiency 05/10/2007   Qualifier: Diagnosis of  By: Wynona Luna    Past Surgical History:  Past Surgical History:  Procedure Laterality Date  . AMPUTATION Right 05/18/2014   Procedure: RIGHT FIFTH RAY AMPUTATION FOOT;  Surgeon: Wylene Simmer, MD;  Location: [facility];  Service: Orthopedics;  Laterality: Right;  . APPENDECTOMY    . BUNIONECTOMY     bilateral  . COLONOSCOPY    .  COLONOSCOPY WITH PROPOFOL N/A 02/02/2014   Procedure: COLONOSCOPY WITH PROPOFOL;  Surgeon: Irene Shipper, MD;  Location: [facility];  Service: Endoscopy;  Laterality: N/A;  . cyst on ovary removed     . CYSTOSCOPY W/ RETROGRADES  03/03/2012   Procedure: CYSTOSCOPY WITH RETROGRADE PYELOGRAM;  Surgeon: Bernestine Amass, MD;  Location: [facility];  Service: Urology;  Laterality: Bilateral;  . CYSTOSCOPY W/ URETERAL STENT PLACEMENT Right 11/25/2017   Procedure: CYSTOSCOPY WITH RETROGRADE RIGHT URETERAL STENT PLACEMENT;  Surgeon: Franchot Gallo, MD;  Location: [facility];  Service: Urology;  Laterality: Right;  . CYSTOSCOPY W/ URETERAL STENT PLACEMENT Right 01/15/2018   Procedure: RIGHT STENT EXCHANGE;  Surgeon: Ardis Hughs, MD;  Location:  [facility];  Service: Urology;  Laterality: Right;  . CYSTOSCOPY WITH RETROGRADE PYELOGRAM, URETEROSCOPY AND STENT PLACEMENT Left 08/25/2012   Procedure: CYSTOSCOPY WITH RETROGRADE PYELOGRAM, URETEROSCOPY;  Surgeon: Bernestine Amass, MD;  Location: [facility];  Service: Urology;  Laterality: Left;  . CYSTOSCOPY WITH STENT PLACEMENT Left 01/15/2018   Procedure: LEFT STENT PLACEMENT;  Surgeon: Ardis Hughs, MD;  Location: [facility];  Service: Urology;  Laterality: Left;  . CYSTOSCOPY WITH STENT PLACEMENT Right 01/22/2018   Procedure: RIGHT STENT REMOVAL;  Surgeon: Ardis Hughs, MD;  Location: [facility];  Service: Urology;  Laterality: Right;  . CYSTOSCOPY/URETEROSCOPY/HOLMIUM LASER/STENT PLACEMENT Right 12/23/2017   Procedure: RIGHT URETEROSCOPY STONE REMOVAL, HOLMIUM LASER, RIGHT /STENT EXCHANGE;  Surgeon: Ardis Hughs, MD;  Location: [facility];  Service: Urology;  Laterality: Right;  . CYSTOSCOPY/URETEROSCOPY/HOLMIUM LASER/STENT PLACEMENT Left 01/22/2018   Procedure: LEFT URETEROSCOPY STONE REMOVAL HOLMIUM LASER LEFT STENT Freddi Starr;  Surgeon: Ardis Hughs, MD;  Location: [facility];  Service: Urology;  Laterality: Left;  . DILATION AND CURETTAGE OF UTERUS N/A 02/21/2013   Procedure: DILATATION AND CURETTAGE;  Surgeon: Lyman Speller, MD;  Location: [facility];  Service: Gynecology;  Laterality: N/A;  . DILATION AND CURETTAGE OF UTERUS N/A 04/09/2015   Procedure: [place] IUD removal;  Surgeon: Megan Salon, MD;  Location: [facility];  Service: Gynecology;  Laterality: N/A;  Patient weight 307lbs  . ESOPHAGOGASTRODUODENOSCOPY (EGD) WITH PROPOFOL N/A 02/02/2014   Procedure: ESOPHAGOGASTRODUODENOSCOPY (EGD) WITH PROPOFOL;  Surgeon: Irene Shipper, MD;  Location: [facility];  Service: Endoscopy;  Laterality: N/A;  . GASTRIC BYPASS  1974  . HOLMIUM LASER APPLICATION Left 07/27/1960   Procedure: HOLMIUM LASER APPLICATION;  Surgeon: Bernestine Amass, MD;  Location: [facility];   Service: Urology;  Laterality: Left;  . LITHOTRIPSY  03/2012  . LITHOTRIPSY  2/16  . THYROIDECTOMY  05/15/2010  . TONSILLECTOMY AND ADENOIDECTOMY    . UPPER GASTROINTESTINAL ENDOSCOPY    . URETEROSCOPY  03/03/2012   Procedure: URETEROSCOPY;  Surgeon: Bernestine Amass, MD;  Location: [facility];  Service: Urology;  Laterality: Left;  . URETEROSCOPY WITH HOLMIUM LASER LITHOTRIPSY Bilateral 01/15/2018   Procedure: CYSTOSCOPY/BILATERAL URETEROSCOPY WITH HOLMIUM LASER LITHOTRIPSY STONE REMOVAL;  Surgeon: Ardis Hughs, MD;  Location: [facility];  Service: Urology;  Laterality: Bilateral;   HPI:  Pt is a 64 y.o. female who presented to [facility] ED 7/27 from rehab due to left-sided abdominal discomfort and generalized weakness. She had recent admission 5/21-5/29 for sepsis secondary to E.coli UTI. Pt was confused on 7/27 and UA confirmed UTI. CT of the head was negative for acute changes. MBS 5/26: oropharyngeal dysphagia was inconsistent in relation to impaired timing and coordination of swallow sequence resulting in penetration/aspiration. A dysphagia  3 diet with honey thick liquids was recommended at that time. Pt's sister-in-law reported that the pt was advanced to nectar thick liquids and ultimately thin liquids at the SNF and has been on thin liquids for 2-3 weeks. CT abdomen: Heterogeneous confluent consolidation in the right lower lobe. There additional patchy opacities in the left lower and right middle lobes. Small right and trace left pleural effusions.   Assessment / Plan / Recommendation Clinical Impression  Pt was seen for bedside swallow evaluation with her sister-in-law present. Pt does have a history of dysphagia but reported that her diet was advanced to regular texture solids and thin liquids at the SNF. Oral mechanism exam was limited due to pt's difficulty following commands; however, oral motor strength and ROM appeared grossly WFL and dentition was adequate. She tolerated 1/2 tsp boluses of  puree without overt s/sx of aspiration but demonstrated prolonged mastication and inconsistently exhibited signs of aspiration with thin liquids and full-tsp boluses of puree. It is recommended that her NPO status be maintained but critical meds may be crushed and given with 1/2 tsp boluses of puree. A modified barium swallow study is recommended to further assess swallow function and is currently scheduled for today at 1300.  SLP Visit Diagnosis: Dysphagia, oropharyngeal phase (R13.12)    Aspiration Risk  Mild aspiration risk;Moderate aspiration risk    Diet Recommendation NPO   Medication Administration: Crushed with puree (1/2 tsp for critical meds)    Other  Recommendations Oral Care Recommendations: Oral care QID   Follow up Recommendations  (TBD)      Frequency and Duration min 2x/week  2 weeks       Prognosis Prognosis for Safe Diet Advancement: Fair Barriers to Reach Goals: Time post onset      Swallow Study   General Date of Onset: 10/17/19 HPI: Pt is a 64 y.o. female who presented to [facility] ED 7/27 from rehab due to left-sided abdominal discomfort and generalized weakness. She had recent admission 5/21-5/29 for sepsis secondary to E.coli UTI. Pt was confused on 7/27 and UA confirmed UTI. CT of the head was negative for acute changes. MBS 5/26: oropharyngeal dysphagia was inconsistent in relation to impaired timing and coordination of swallow sequence resulting in penetration/aspiration. A dysphagia 3 diet with honey thick liquids was recommended at that time. Pt's daughter reported that the pt was advanced to nectar thick liquids and ultimately thin liquids at the SNF and has been on thin liquids for 2-3 weeks. CT abdomen: Heterogeneous confluent consolidation in the right lower lobe. There additional patchy opacities in the left lower and right middle lobes. Small right and trace left pleural effusions. Type of Study: Bedside Swallow Evaluation Previous Swallow Assessment: See  HPI Diet Prior to this Study: NPO Temperature Spikes Noted: Yes Respiratory Status: Room air History of Recent Intubation: No Behavior/Cognition: Alert;Cooperative;Pleasant mood Oral Cavity Assessment: Dried secretions Oral Care Completed by SLP: Recent completion by staff Oral Cavity - Dentition: Adequate natural dentition Vision: Functional for self-feeding Self-Feeding Abilities: Needs assist Patient Positioning: Upright in bed;Postural control adequate for testing Baseline Vocal Quality: Normal Volitional Cough: Strong Volitional Swallow: Able to elicit    Oral/Motor/Sensory Function Overall Oral Motor/Sensory Function: Within functional limits   Ice Chips Ice chips: Impaired Presentation: Spoon Pharyngeal Phase Impairments: Cough - Delayed (inconsistently)   Thin Liquid Thin Liquid: Impaired Presentation: Cup;Straw Pharyngeal  Phase Impairments: Cough - Delayed;Cough - Immediate (delayed with thin via cup; immediate via straw)    Nectar Thick Nectar Thick  Liquid: Not tested   Honey Thick Honey Thick Liquid: Not tested   Puree Puree: Within functional limits Presentation: Spoon   Solid     Solid: Impaired Oral Phase Impairments: Impaired mastication     Dione Mccombie I. Hardin Negus, [place], [place] Office number [phone] Pager [phone]  Horton Marshall 12/22/2019,10:10 AM

## 2019-12-22 NOTE — Progress Notes (Signed)
  Speech Language Pathology Treatment: Dysphagia  Patient Details Name: [name] MRN: [id number] DOB: 08-30-1955 Today's Date: 12/22/2019 Time: 9826-4158 SLP Time Calculation (min) (ACUTE ONLY): 20 min  Assessment / Plan / Recommendation Clinical Impression  Pt was seen for dysphagia treatment with her brother present. Pt's family has requested that the modified barium swallow study be deferred at this time since they are currently discussing how aggressive they would like to be with her care at this time. SLP therefore returned for further clinical assessment to determine what may be there safest p.o. diet at this time. Pt tolerated nectar thick liquids via cup and straw without overt s/sx of aspiration. Mastication time was prolonged with dysphagia 2 solids and pt appeared fatigued after mastication. No significant oral residue was noted with any trials. It is recommended that a dysphagia 1 (puree) diet with nectar thick liquids be initiated at this time. SLP will follow to assess diet tolerance and for instrumental assessment if this aligns with the pt/family's goals of care.    HPI HPI: Pt is a 64 y.o. female who presented to [facility] ED 7/27 from rehab due to left-sided abdominal discomfort and generalized weakness. She had recent admission 5/21-5/29 for sepsis secondary to E.coli UTI. Pt was confused on 7/27 and UA confirmed UTI. CT of the head was negative for acute changes. MBS 5/26: oropharyngeal dysphagia was inconsistent in relation to impaired timing and coordination of swallow sequence resulting in penetration/aspiration. A dysphagia 3 diet with honey thick liquids was recommended at that time. Pt's daughter reported that the pt was advanced to nectar thick liquids and ultimately thin liquids at the SNF and has been on thin liquids for 2-3 weeks. CT abdomen: Heterogeneous confluent consolidation in the right lower lobe. There additional patchy opacities in the left lower and right middle  lobes. Small right and trace left pleural effusions.      SLP Plan  Continue with current plan of care       Recommendations  Diet recommendations: Dysphagia 1 (puree);Nectar-thick liquid Liquids provided via: Cup;Straw Medication Administration: Crushed with puree (1/2 tsp for critical meds) Supervision: Full supervision/cueing for compensatory strategies;Staff to assist with self feeding Compensations: Slow rate;Small sips/bites Postural Changes and/or Swallow Maneuvers: Seated upright 90 degrees;Upright 30-60 min after meal                Oral Care Recommendations: Oral care BID Follow up Recommendations:  (TBD) SLP Visit Diagnosis: Dysphagia, pharyngeal phase (R13.13) Plan: Continue with current plan of care       Meaghann Choo I. Hardin Negus, [place], [place] Office number [phone] Pager [place] 12/22/2019, 4:42 PM

## 2019-12-22 NOTE — Progress Notes (Signed)
eLink Physician-Brief Progress Note Patient Name: [name] DOB: 04/14/1956 MRN: [id number]   Date of Service  12/22/2019  HPI/Events of Note  Uroseptic shock. Now has picc line.   Camera: MAP 58. On room air. sats 97%. No tachycardia. Encephalopathy. Labs, meds reviewed.  RN asking to increase Levo strength.   eICU Interventions  - Levo 0 to 40/quardupple strength ordered via PICC line - NS 500 ml bolus. EF 55%/diastolic chf. Watch for fluid overload. CVP 7.      Intervention Category Intermediate Interventions: Hypotension - evaluation and management  Elmer Sow 12/22/2019, 2:17 AM

## 2019-12-22 NOTE — Progress Notes (Signed)
NAME:  [name], MRN:  [id number], DOB:  Jun 05, 1955, LOS: 2 ADMISSION DATE:  12/20/2019, CONSULTATION DATE:  12/20/19 REFERRING MD:  Francia Greaves  CHIEF COMPLAINT:  abd pain, weakness  Brief History   [name] is a 64 y.o. female who was admitted 7/27 with urosepsis.  History of present illness   [name] is a 64 y.o. female who has a PMH as outlined below.  She presented to [facility] ED 7/27 from rehab due to left sided abdominal discomfort and generalized weakness.  She had recent admission 10/14/19 through 10/22/19 for sepsis 2/2 E.coli UTI.  She was discharged to rehab facility after that admission for ongoing rehab.  Per brother, over the past 2 weeks or so, pt has not been able to stand at all.  She has basically been bedridden.  On 7/27, she wasn't able to state his name and sounded confused.  Brother felt she likely had recurrent UTI's due to her behaving similarly in the past with prior UTI's.  She was also borderline hypotensive.  In ED, UA confirmed UTI.  She also had multiple metabolic derangements as well as anemia with significant coagulopathy (INR > 10).  She was given 10mg  vitamin K and is s/p 1 u RBC.  Besides bloody urine, there has been no obvious bleeding.  Past Medical History  has Hypothyroidism; Vitamin D deficiency; Morbid obesity ([place]); Iron deficiency anemia secondary to malabsorption ; Anemia; Leukocytosis; Essential hypertension; Rhinosinusitis, recurrent; Asthma; GERD (gastroesophageal reflux disease); Hypoparathyroidism ([place]); PAF (paroxysmal atrial fibrillation); Low back pain; Hx of papillary thyroid carcinoma; Nonspecific abnormal unspecified cardiovascular function study; Weight gain; Mid back pain; Fatty liver; Muscle cramps; Trochanteric bursitis of left hip; Endometrial adenocarcinoma ([place]); Foot pain; Edema; Reflux esophagitis; Pernicious anemia; Osteomyelitis ([place]); Right ureteral stone; Recurrent falls; Hypokalemia; Hypocalcemia; Sepsis ([place]); Sepsis due  to Escherichia coli (E. coli) ([place]); E coli bacteremia; Pyelonephritis; Pressure injury of skin; UTI (urinary tract infection); Chronic anticoagulation; Abnormal nuclear stress test; Debilitated patient; Mild neurocognitive disorder; Major depressive disorder; Generalized anxiety disorder; Hypothermia; Thrombocytopenia ([place]); AKI (acute kidney injury) ([place]); Knee pain; and Pressure injury of coccygeal region, unstageable ([place]) on their problem list.  [place] [facility] Events   7/27 > admit. 7/28> increasing pressor requirement despite fluid resuscitation  Consults:  None.  Procedures:  None.  Significant Diagnostic Tests:  CT abd / pelv 7/27 > no hemorrhage, third spacing, bilateral non-obstructing renal calculi, hepatosplenomegaly. CT head 7/27 > neg.  Micro Data:  COVID 7/27 > neg. Blood 7/27 >  Urine 7/27 >  MRSA 7/28> pos  Antimicrobials:  Cefepime 7/27 >   Vancomycin 7/28 >  Interim history/subjective:   Had conversation with brother of patient today about goals of care no definitive plan in place; to follow up tomorrow.  Responded well to 0.5 L bolus of NS overnight. Weaned to 1 NE through PICC -pt c/o pain all over. Numerous sites of bruising L arm swollen.     Objective:  Blood pressure (!) 120/57, pulse 90, temperature 99.3 F (37.4 C), resp. rate 13, weight (!) 106.9 kg, last menstrual period 06/11/2012, SpO2 98 %. CVP:  [3 mmHg-6 mmHg] 3 mmHg      Intake/Output Summary (Last 24 hours) at 12/22/2019 0949 Last data filed at 12/22/2019 0900 Gross per 24 hour  Intake 4530.6 ml  Output 1530 ml  Net 3000.6 ml   Filed Weights   12/21/19 0240  Weight: (!) 106.9 kg    Examination: General: Adult chronically and critically  ill F, appears older than stated age. Reclined in bed, NAD  Neuro: Awakens to voice, answers questions, intermittently alert HEENT: NCAt anicteric sclera. Pink tacky mm. Trachea midline  Cardiovascular: RRR, no M/R/G.  Lungs: Even  unlabored respirations. CTA.  Abdomen: Obese soft ndnt  Musculoskeletal: No gross deformities, 1+ edema to knees. LUE swelling >>RUE Skin: Diffuse ecchymosis and scattered maceration BUE. Warm, clean  GU:  Bloody urine collecting in foley bag   Assessment & Plan:   Acute encephalopathy -in setting of septic shock  P -delirium precautions   Septic shock 2/2 presumed UTI -- numerous E. Coli UTI infections in past with severe urosepsis P -Continue cefepime, vanc added on yesterday given worsening shock - Weaned to 1 NE -Peripheral pressors for SBP > 90 -continue IVF, albumin.  -Started midodrine   Coagulopathy - Unlikely DIC given high fibrinogen -s/p Vit K  Thrombocytopenia. -likely in setting of critical illness Anemia, worsening from 9.5 to 7.2 - hematuria, no obvious bleeding otherwise  - s/p 1 PRBC P - transfuse 1 pRBC   Hypoglycemia -improving? P -continue D5 fluids, esp while npo -liver function is improving  AKI Hyperkalemia: resolved Hematuria Non-obstructing renal calculi on CT  P -continue IVF -trend renal indices, improving -uop marginal  Hx PAF (on eliquis), dCHF. - Hold home eliquis for now. - Hold home diltiazem, furosemide, spironolactone.  Hx hypothyroidism. - Continue home synthroid.  Hx COPD. - BD's.  Sacral wound present on admission P -WOCN  Goals of Care -DNR/DNI - GOC discussed today with patient's brother HCPOA-- pt brother.  - Follow up 7/30 with patient's family about GOC direction  Best Practice:  Diet: NPO for mbs Pain/Anxiety/Delirium protocol (if indicated): N/A. VAP protocol (if indicated): N/A. DVT prophylaxis: SCD's only. GI prophylaxis: None. Glucose control: None. Mobility: Bedrest. Code Status: DNR. Family Communication: discussed at length with brother at bedside.  Disposition: ICU.  Labs   CBC: Recent Labs  Lab 12/20/19 1323 12/20/19 1755 12/21/19 0230 12/21/19 1018 12/22/19 0249  WBC 6.5 6.6 9.8   --  7.4  NEUTROABS 5.9  --   --   --  5.8  HGB 8.0* 6.7* 9.5*  --  7.2*  HCT 26.3* 22.5* 30.3*  --  23.5*  MCV 93.3 93.0 91.5  --  94.4  PLT PLATELET CLUMPS NOTED ON SMEAR, COUNT APPEARS DECREASED 25* 47* PLATELET CLUMPS NOTED ON SMEAR, COUNT APPEARS DECREASED 34*   Basic Metabolic Panel: Recent Labs  Lab 12/20/19 1323 12/21/19 0230 12/21/19 1018 12/22/19 0249  NA 135 138 138 141  K 5.3* 5.3* 4.9 4.1  CL 101 105 108 110  CO2 20* 22 19* 19*  GLUCOSE 59* 55* 80 314*  BUN 59* 53* 52* 43*  CREATININE 3.36* 2.98* 3.01* 2.57*  CALCIUM 7.6* 7.7* 7.5* 7.3*  MG  --  2.6*  --  2.0  PHOS  --  5.2*  --  4.0   GFR: Estimated Creatinine Clearance: 27.1 mL/min (A) (by C-G formula based on SCr of 2.57 mg/dL (H)). Recent Labs  Lab 12/20/19 1323 12/20/19 1735 12/20/19 1755 12/21/19 0230 12/22/19 0249  WBC 6.5  --  6.6 9.8 7.4  LATICACIDVEN 0.7 1.4  --   --   --    Liver Function Tests: Recent Labs  Lab 12/20/19 1323 12/21/19 1018 12/22/19 0249  AST 113* 78*  --   ALT 91* 67*  --   ALKPHOS 250* 220*  --   BILITOT 1.0 1.0  --   PROT  4.8* 4.8*  --   ALBUMIN 1.4* 1.7* 1.7*   No results for input(s): LIPASE, AMYLASE in the last 168 hours. No results for input(s): AMMONIA in the last 168 hours. ABG    Component Value Date/Time   PHART 7.445 10/17/2019 2015   PCO2ART 29.0 (L) 10/17/2019 2015   PO2ART 106 10/17/2019 2015   HCO3 19.7 (L) 10/17/2019 2015   TCO2 21 (L) 10/14/2019 1338   ACIDBASEDEF 3.8 (H) 10/17/2019 2015   O2SAT 95.9 10/17/2019 2015    Coagulation Profile: Recent Labs  Lab 12/20/19 1434 12/21/19 0230 12/21/19 1018 12/22/19 0249  INR >10.0* 3.0* 2.3* 1.7*   Cardiac Enzymes: Recent Labs  Lab 12/21/19 1526  CKTOTAL 51   HbA1C: No results found for: HGBA1C CBG: Recent Labs  Lab 12/21/19 1538 12/21/19 2012 12/22/19 0026 12/22/19 0422 12/22/19 0753  GLUCAP 75 71 76 80 81     Critical care time: The patient is critically ill with multiple  organ systems failure and requires high complexity decision making for assessment and support, frequent evaluation and titration of therapies, application of advanced monitoring technologies and extensive interpretation of multiple databases.  Critical care time 34 mins. This represents my time independent of the NPs time taking care of the pt. This is excluding procedures.    [facility] 12/22/2019, 3:58 PM

## 2019-12-22 NOTE — Significant Event (Signed)
Rescheduling Barium study at the requests of patient's family. Patient in agreement.

## 2019-12-22 NOTE — Progress Notes (Signed)
Daily updates provided to patient's family/POA, [name], at bedside. Family is concerned for possible ongoing suffering of the patient in context of ongoing aggressive care.  We thoughtfully discussed interval improvements of critical illness (decreasing pressor requirement, abx course)  as well as overall goals of care as it pertains to the patient's quality of life with severe baseline debility, chronic illness, and ongoing acute on chronic pain.   We discussed paths for ongoing aggressive medical care (within confines of established code status) vs shifting toward palliative focussed medical interventions. [name] would like to discuss these trajectories with other family members and is appreciative of this discussion.    -will engage spiritual care for family support -continue Rafael Capo conversations  Eliseo Gum MSN, AGACNP-BC [place] [phone] If no answer, [phone] 12/22/2019, 1:44 PM

## 2019-12-22 NOTE — Progress Notes (Signed)
eLink Physician-Brief Progress Note Patient Name: [name] DOB: September 05, 1955 MRN: [id number]   Date of Service  12/22/2019  HPI/Events of Note  Pt has chronic pain and usually takes tramadol at home. Pt is NPO.  RN asking for orders to treat pt's pain. She did receive a one time dose of morphine yesterday that was effective  eICU Interventions  As per bed side RN, now she is awake, follows commands. On room air.  -low dose morphine 0.5 mg ordered. Watch for confusion, lethargy. NPO.      Intervention Category Intermediate Interventions: Pain - evaluation and management  Elmer Sow 12/22/2019, 4:14 AM

## 2019-12-22 NOTE — Plan of Care (Signed)

## 2019-12-23 ENCOUNTER — Inpatient Hospital Stay (HOSPITAL_COMMUNITY): Payer: 59

## 2019-12-23 ENCOUNTER — Other Ambulatory Visit: Payer: Self-pay

## 2019-12-23 ENCOUNTER — Encounter (HOSPITAL_COMMUNITY): Payer: Self-pay | Admitting: Pulmonary Disease

## 2019-12-23 DIAGNOSIS — R609 Edema, unspecified: Secondary | ICD-10-CM | POA: Diagnosis not present

## 2019-12-23 LAB — CBC
HCT: 20.9 % — ABNORMAL LOW (ref 36.0–46.0)
HCT: 24.5 % — ABNORMAL LOW (ref 36.0–46.0)
HCT: 22.7 % — ABNORMAL LOW (ref 36.0–46.0)
HCT: 25.7 % — ABNORMAL LOW (ref 36.0–46.0)
Hemoglobin: 6.2 g/dL — CL (ref 12.0–15.0)
Hemoglobin: 7.2 g/dL — ABNORMAL LOW (ref 12.0–15.0)
Hemoglobin: 6.7 g/dL — CL (ref 12.0–15.0)
Hemoglobin: 8 g/dL — ABNORMAL LOW (ref 12.0–15.0)
MCH: 28.7 pg (ref 26.0–34.0)
MCH: 27.1 pg (ref 26.0–34.0)
MCH: 27 pg (ref 26.0–34.0)
MCH: 28.7 pg (ref 26.0–34.0)
MCHC: 29.7 g/dL — ABNORMAL LOW (ref 30.0–36.0)
MCHC: 29.4 g/dL — ABNORMAL LOW (ref 30.0–36.0)
MCHC: 29.5 g/dL — ABNORMAL LOW (ref 30.0–36.0)
MCHC: 31.1 g/dL (ref 30.0–36.0)
MCV: 96.8 fL (ref 80.0–100.0)
MCV: 92.1 fL (ref 80.0–100.0)
MCV: 91.5 fL (ref 80.0–100.0)
MCV: 92.1 fL (ref 80.0–100.0)
Platelets: 23 10*3/uL — CL (ref 150–400)
Platelets: 19 10*3/uL — CL (ref 150–400)
Platelets: 23 10*3/uL — CL (ref 150–400)
Platelets: 26 10*3/uL — CL (ref 150–400)
RBC: 2.16 MIL/uL — ABNORMAL LOW (ref 3.87–5.11)
RBC: 2.66 MIL/uL — ABNORMAL LOW (ref 3.87–5.11)
RBC: 2.48 MIL/uL — ABNORMAL LOW (ref 3.87–5.11)
RBC: 2.79 MIL/uL — ABNORMAL LOW (ref 3.87–5.11)
RDW: 21.5 % — ABNORMAL HIGH (ref 11.5–15.5)
RDW: 23.9 % — ABNORMAL HIGH (ref 11.5–15.5)
RDW: 24.7 % — ABNORMAL HIGH (ref 11.5–15.5)
RDW: 23.7 % — ABNORMAL HIGH (ref 11.5–15.5)
WBC: 5.3 10*3/uL (ref 4.0–10.5)
WBC: 5.6 10*3/uL (ref 4.0–10.5)
WBC: 5 10*3/uL (ref 4.0–10.5)
WBC: 6.3 10*3/uL (ref 4.0–10.5)
nRBC: 0 % (ref 0.0–0.2)
nRBC: 0 % (ref 0.0–0.2)
nRBC: 0 % (ref 0.0–0.2)
nRBC: 0 % (ref 0.0–0.2)

## 2019-12-23 LAB — COMPREHENSIVE METABOLIC PANEL
ALT: 40 U/L (ref 0–44)
AST: 40 U/L (ref 15–41)
Albumin: 1.4 g/dL — ABNORMAL LOW (ref 3.5–5.0)
Alkaline Phosphatase: 162 U/L — ABNORMAL HIGH (ref 38–126)
Anion gap: 9 (ref 5–15)
BUN: 37 mg/dL — ABNORMAL HIGH (ref 8–23)
CO2: 20 mmol/L — ABNORMAL LOW (ref 22–32)
Calcium: 7.8 mg/dL — ABNORMAL LOW (ref 8.9–10.3)
Chloride: 116 mmol/L — ABNORMAL HIGH (ref 98–111)
Creatinine, Ser: 2.35 mg/dL — ABNORMAL HIGH (ref 0.44–1.00)
GFR calc Af Amer: 25 mL/min — ABNORMAL LOW (ref >60)
GFR calc non Af Amer: 21 mL/min — ABNORMAL LOW (ref >60)
Glucose, Bld: 271 mg/dL — ABNORMAL HIGH (ref 70–99)
Potassium: 3.5 mmol/L (ref 3.5–5.1)
Sodium: 145 mmol/L (ref 135–145)
Total Bilirubin: 0.8 mg/dL (ref 0.3–1.2)
Total Protein: 4.1 g/dL — ABNORMAL LOW (ref 6.5–8.1)

## 2019-12-23 LAB — RETICULOCYTES
Immature Retic Fract: 21.1 % — ABNORMAL HIGH (ref 2.3–15.9)
RBC.: 2.64 MIL/uL — ABNORMAL LOW (ref 3.87–5.11)
Retic Count, Absolute: 33.3 10*3/uL (ref 19.0–186.0)
Retic Ct Pct: 1.3 % (ref 0.4–3.1)

## 2019-12-23 LAB — GLUCOSE, CAPILLARY
Glucose-Capillary: 105 mg/dL — ABNORMAL HIGH (ref 70–99)
Glucose-Capillary: 115 mg/dL — ABNORMAL HIGH (ref 70–99)
Glucose-Capillary: 118 mg/dL — ABNORMAL HIGH (ref 70–99)
Glucose-Capillary: 141 mg/dL — ABNORMAL HIGH (ref 70–99)
Glucose-Capillary: 90 mg/dL (ref 70–99)
Glucose-Capillary: 95 mg/dL (ref 70–99)

## 2019-12-23 LAB — PREPARE RBC (CROSSMATCH)

## 2019-12-23 LAB — FIBRINOGEN: Fibrinogen: 474 mg/dL (ref 210–475)

## 2019-12-23 LAB — SAVE SMEAR(SSMR), FOR PROVIDER SLIDE REVIEW

## 2019-12-23 LAB — HAPTOGLOBIN: Haptoglobin: 182 mg/dL (ref 37–355)

## 2019-12-23 LAB — PROTIME-INR
INR: 1.4 — ABNORMAL HIGH (ref 0.8–1.2)
Prothrombin Time: 16.9 seconds — ABNORMAL HIGH (ref 11.4–15.2)

## 2019-12-23 MED ORDER — ADULT MULTIVITAMIN W/MINERALS CH
1.0000 | ORAL_TABLET | Freq: Two times a day (BID) | ORAL | Status: DC
  Administered 2019-12-23 – 2019-12-26 (×6): 1 via ORAL
  Filled 2019-12-23 (×6): qty 1

## 2019-12-23 MED ORDER — SODIUM CHLORIDE 0.9% IV SOLUTION: Freq: Once | INTRAVENOUS | Status: AC

## 2019-12-23 MED ORDER — MIDODRINE HCL 5 MG PO TABS
10.0000 mg | ORAL_TABLET | Freq: Three times a day (TID) | ORAL | Status: DC
  Administered 2019-12-24 – 2019-12-25 (×4): 10 mg via ORAL
  Filled 2019-12-23 (×4): qty 2

## 2019-12-23 MED ORDER — ENSURE ENLIVE PO LIQD
237.0000 mL | Freq: Three times a day (TID) | ORAL | Status: DC
  Administered 2019-12-23 – 2019-12-27 (×10): 237 mL via ORAL

## 2019-12-23 MED ORDER — SODIUM CHLORIDE 0.9% IV SOLUTION: Freq: Once | INTRAVENOUS | Status: DC

## 2019-12-23 NOTE — Progress Notes (Signed)
Nutrition Follow-up  DOCUMENTATION CODES:   Non-severe (moderate) malnutrition in context of chronic illness  INTERVENTION:   Feeding assistance to promote po intake  Ensure Enlive po TID, each supplement provides 350 kcal and 20 grams of protein  Magic cup TID with meals, each supplement provides 290 kcal and 9 grams of protein  Add MVI with Minerals BID daily  Add Vit C 500 mg BID and Zinf Sulfate 200 mg for wound healing  Check the following labs for vit deficiencies: Copper, Selenium, Vit A, Vit E, Thiamine, Zinc, Vit C  If po intake inadequate and aggressive nutrition intervention algins with GOC, recommend insertion of Cortrak placement with initiation of TF    NUTRITION DIAGNOSIS:   Moderate Malnutrition related to chronic illness as evidenced by mild fat depletion, moderate muscle depletion, edema, energy intake < 75% for > or equal to 1 month.  GOAL:   Patient will meet greater than or equal to 90% of their needs  MONITOR:   Diet advancement, PO intake, Labs, Weight trends  REASON FOR ASSESSMENT:   Rounds Wound healing  ASSESSMENT:   64 yo female admitted with acute encephalopathy with septic shock, AKI. Recent admission for sepsis secondary to E.coli UTI and discharged to rehab. PMH includes gastric bypass, hx of papillary thyroid carcinoma s/p thryoidectomy, Vit D deficiency, iron def anemia, HTN, GERD, recurrent falls, hypokalemia, hypocalcemia, pressure injuries, toe amputation, mild neurocognitive disorder, COPD  Pt is DNR, noted palliative care has been consulted. Noted pt prayed about "making it through this" with Chaplain  Currently on levophed  Diet advanced to Dysphagia I, Nectar Thick. Pt ate 75% of breakfast this AM  Labs: corrected calcium 9.9, albumin 1.4, sodium 145, Creatinine 2.35, BUN 37 Meds: D5 at 50 ml/hr   Diet Order:   Diet Order            DIET - DYS 1 Room service appropriate? Yes; Fluid consistency: Nectar Thick  Diet  effective now                 EDUCATION NEEDS:   Not appropriate for education at this time  Skin:  Skin Assessment: Skin Integrity Issues: Skin Integrity Issues:: Other (Comment), Unstageable Unstageable: Sacrum-DTI evolving into unstageable (WOC RN following) Other: Eccymosis all over including on chest, arms, hands, legs, shoulder; MASD; Skin folds of breast, abdomen and groin with partial thickness wounds consistent with intertrigo  Last BM:  no documented BM  Height:   Ht Readings from Last 1 Encounters:  12/23/19 5\' 4"  (1.626 m)    Weight:   Wt Readings from Last 1 Encounters:  12/23/19 (!) 108.9 kg    Ideal Body Weight:  54.5 kg  BMI:  Body mass index is 41.21 kg/m.  Estimated Nutritional Needs:   Kcal:  2100-2300 kcals  Protein:  110-140 g  Fluid:  >/= 2L   Kerman Passey MS, RDN, LDN, CNSC Registered Dietitian III Clinical Nutrition RD Pager and On-Call Pager Number Located in [place]

## 2019-12-23 NOTE — Progress Notes (Signed)
Bilateral upper extremity venous duplex has been completed. Preliminary results can be found in CV Proc through chart review.   12/23/19 1:57 PM Carlos Levering RVT

## 2019-12-23 NOTE — Progress Notes (Signed)
CRITICAL VALUE ALERT  Critical Value:  Hgb 6.7  Date & Time Notied:  12/23/19 1602  Provider Notified: Dr. Ruthann Cancer  Orders Received/Actions taken: Waiting on new orders.  Joellen Jersey, RN

## 2019-12-23 NOTE — Plan of Care (Signed)

## 2019-12-23 NOTE — Evaluation (Addendum)
Physical Therapy Evaluation Patient Details Name: [name] MRN: [id number] DOB: 1956-01-02 Today's Date: 12/23/2019   History of Present Illness  64 y.o. female admitted from SNF with abdominal pain, AMS, UTI with urosepsis. PMHx: admission May of this year with UTI and sepsis with D/C to SNF, paroxysmal A. fib, chronic diastolic CHF, COPD, history of thyroidectomy on Synthroid, GERD, hypertension, asthma, anxiety, depression, morbid obesity  Clinical Impression  Pt has been at [facility] since May admission with slow progression in mobility to walking short distances with assist. Pt reports ultimate goal is to be able to move well enough to return home with W/C, ramp and family assist. Pt currently with decreased strength, balance, transfers and functional mobility with sacral wound who continues to require 2 person assist and cannot return home but back to [facility] is appropriate. Pt will benefit from acute therapy to maximize function and mobility in hopes of increasing function prior to return to SNF for further rehab. Of note bed was not functioning properly and RN present with plan to exchange.      Follow Up Recommendations SNF;Supervision/Assistance - 24 hour    Equipment Recommendations  Hospital bed    Recommendations for Other Services       Precautions / Restrictions Precautions Precautions: Fall      Mobility  Bed Mobility Overal bed mobility: Needs Assistance Bed Mobility: Supine to Sit;Sit to Supine     Supine to sit: HOB elevated     General bed mobility comments: HOB elevated and utilized foot egress to achieve sitting position. Max +2 assist to scoot hips toward EOB. Return to supine with max assist as pt not following commands to lean into surface. Bed positioning to return to supine. Total +2 with gravity to slide toward [facility]  Transfers                 General transfer comment: allowed pt opportunity to attempt to stand from foot egress with bil knees  blocked and bil UE support but pt unable to significantly initiate translation into standing and could not lift sacrum  Ambulation/Gait             General Gait Details: unable at this time  Stairs            Wheelchair Mobility    Modified Rankin (Stroke Patients Only)       Balance Overall balance assessment: Needs assistance Sitting-balance support: No upper extremity supported;Bilateral upper extremity supported;Feet supported Sitting balance-Leahy Scale: Zero Sitting balance - Comments: sitting at foot egress with and without rails pt required min to mod assist with variation of anterior to posterior lean                                     Pertinent Vitals/Pain Pain Assessment: 0-10 Pain Score: 4  Pain Location: generalized soreness Pain Descriptors / Indicators: Sore Pain Intervention(s): Limited activity within patient's tolerance;Monitored during session;Repositioned    Home Living Family/patient expects to be discharged to:: Skilled nursing facility                      Prior Function Level of Independence: Needs assistance   Gait / Transfers Assistance Needed: has been walking short distances with RW and chair follow grossly 20' at SNF. Prior to admission in May was home alone and only transferring to [facility]  ADL's / Homemaking Assistance Needed: staff  at SNF assisting with all functional tasks        Hand Dominance        Extremity/Trunk Assessment   Upper Extremity Assessment Upper Extremity Assessment: Generalized weakness    Lower Extremity Assessment Lower Extremity Assessment: Generalized weakness (limited by body habitus, grossly 2/5)    Cervical / Trunk Assessment Cervical / Trunk Assessment: Kyphotic  Communication   Communication: No difficulties  Cognition Arousal/Alertness: Awake/alert Behavior During Therapy: WFL for tasks assessed/performed Overall Cognitive Status: Impaired/Different from  baseline Area of Impairment: Following commands;Safety/judgement;Memory                     Memory: Decreased short-term memory Following Commands: Follows one step commands inconsistently;Follows one step commands with increased time Safety/Judgement: Decreased awareness of safety;Decreased awareness of deficits     General Comments: pt with difficulty recalling PLOF and looking to family for responses. Pt with delayed command following and unable to process how to perform basic transfers      General Comments      Exercises     Assessment/Plan    PT Assessment Patient needs continued PT services  PT Problem List Decreased strength;Decreased mobility;Decreased range of motion;Decreased activity tolerance;Decreased balance;Decreased knowledge of use of DME;Pain;Decreased cognition;Obesity       PT Treatment Interventions Therapeutic exercise;Balance training;Functional mobility training;Therapeutic activities;Patient/family education;Neuromuscular re-education    PT Goals (Current goals can be found in the Care Plan section)  Acute Rehab PT Goals Patient Stated Goal: be able to walk PT Goal Formulation: With patient/family Time For Goal Achievement: 01/06/20 Potential to Achieve Goals: Fair    Frequency Min 2X/week   Barriers to discharge Decreased caregiver support      Co-evaluation               AM-PAC PT "6 Clicks" Mobility  Outcome Measure Help needed turning from your back to your side while in a flat bed without using bedrails?: A Lot Help needed moving from lying on your back to sitting on the side of a flat bed without using bedrails?: Total Help needed moving to and from a bed to a chair (including a wheelchair)?: Total Help needed standing up from a chair using your arms (e.g., wheelchair or bedside chair)?: Total Help needed to walk in hospital room?: Total Help needed climbing 3-5 steps with a railing? : Total 6 Click Score: 7    End of  Session   Activity Tolerance: Patient tolerated treatment well Patient left: in bed;with call bell/phone within reach;with family/visitor present;with nursing/sitter in room Nurse Communication: Mobility status;Need for lift equipment PT Visit Diagnosis: Other abnormalities of gait and mobility (R26.89);Muscle weakness (generalized) (M62.81);Difficulty in walking, not elsewhere classified (R26.2)    Time: 7654-6503 PT Time Calculation (min) (ACUTE ONLY): 21 min   Charges:   PT Evaluation $PT Eval Moderate Complexity: 1 Mod          Elisheba Mcdonnell P, PT Acute Rehabilitation Services Pager: [phone] Office: [phone]   Jaqua Ching B Lynia Landry 12/23/2019, 1:12 PM

## 2019-12-23 NOTE — Progress Notes (Signed)
NAME:  [name], MRN:  [id number], DOB:  26-Aug-1955, LOS: 3 ADMISSION DATE:  12/20/2019, CONSULTATION DATE:  12/20/19 REFERRING MD:  Francia Greaves  CHIEF COMPLAINT:  abd pain, weakness  Brief History   [name] is a 64 y.o. female who was admitted 7/27 with urosepsis.  History of present illness   [name] is a 64 y.o. female who has a PMH as outlined below.  She presented to [facility] ED 7/27 from rehab due to left sided abdominal discomfort and generalized weakness.  She had recent admission 10/14/19 through 10/22/19 for sepsis 2/2 E.coli UTI.  She was discharged to rehab facility after that admission for ongoing rehab.  Per brother, over the past 2 weeks or so, pt has not been able to stand at all.  She has basically been bedridden.  On 7/27, she wasn't able to state his name and sounded confused.  Brother felt she likely had recurrent UTI's due to her behaving similarly in the past with prior UTI's.  She was also borderline hypotensive.  In ED, UA confirmed UTI.  She also had multiple metabolic derangements as well as anemia with significant coagulopathy (INR > 10).  She was given 10mg  vitamin K and is s/p 1 u RBC.  Besides bloody urine, there has been no obvious bleeding.  Past Medical History  has Hypothyroidism; Vitamin D deficiency; Morbid obesity ([place]); Iron deficiency anemia secondary to malabsorption ; Anemia; Leukocytosis; Essential hypertension; Rhinosinusitis, recurrent; Asthma; GERD (gastroesophageal reflux disease); Hypoparathyroidism ([place]); PAF (paroxysmal atrial fibrillation); Low back pain; Hx of papillary thyroid carcinoma; Nonspecific abnormal unspecified cardiovascular function study; Weight gain; Mid back pain; Fatty liver; Muscle cramps; Trochanteric bursitis of left hip; Endometrial adenocarcinoma ([place]); Foot pain; Edema; Reflux esophagitis; Pernicious anemia; Osteomyelitis ([place]); Right ureteral stone; Recurrent falls; Hypokalemia; Hypocalcemia; Sepsis ([place]); Sepsis due  to Escherichia coli (E. coli) ([place]); E coli bacteremia; Pyelonephritis; Pressure injury of skin; UTI (urinary tract infection); Chronic anticoagulation; Abnormal nuclear stress test; Debilitated patient; Mild neurocognitive disorder; Major depressive disorder; Generalized anxiety disorder; Hypothermia; Thrombocytopenia ([place]); AKI (acute kidney injury) ([place]); Knee pain; and Pressure injury of coccygeal region, unstageable ([place]) on their problem list.  [facility] Events   7/27 > admit. 7/28> increasing pressor requirement despite fluid resuscitation 7.30 > Off pressors  Consults:  None.  Procedures:  None.  Significant Diagnostic Tests:  CT abd / pelv 7/27 > no hemorrhage, third spacing, bilateral non-obstructing renal calculi, hepatosplenomegaly. CT head 7/27 > neg.  Micro Data:  COVID 7/27 > neg. Blood 7/27 >  Urine 7/27 >  MRSA 7/28> pos  Antimicrobials:  Cefepime 7/27 >   Vancomycin 7/28 >  Interim history/subjective:   HGB drop to 6.2 overnight Transfused with 1 U PRBC per E Link>> HGB up to 7.2 after 1 unit. Thrombocytopenia with PLT drop to 19,000 from 23,000 at 3:40 am No obvious signs of bleeding with exception of some hematuria States she does not have any pain  + 3 L, + 800 cc's last 24 T max 99.3  Objective:  Blood pressure 105/74, pulse 71, temperature (!) 96.6 F (35.9 C), resp. rate 15, weight (!) 108.9 kg, last menstrual period 06/11/2012, SpO2 95 %.        Intake/Output Summary (Last 24 hours) at 12/23/2019 1138 Last data filed at 12/23/2019 1000 Gross per 24 hour  Intake 2029.62 ml  Output 1515 ml  Net 514.62 ml   Filed Weights   12/21/19 0240 12/23/19 0500  Weight: (!) 106.9  kg (!) 108.9 kg    Examination: General: Adult chronically and critically ill F, appears older than stated age. Reclined in bed, NAD , states she has no pain Neuro: Awakens to voice, answers questions, intermittently alert, MAE x 4, A&O to self and place HEENT:  NCAt anicteric sclera. Pink tacky mm. Trachea midline  Cardiovascular: S1, S2, RRR, no M/R/G.  Lungs: Bilateral chest excursion, CTA, Sats 95% on RA .  Abdomen: Obese soft ndnt  Musculoskeletal: No gross deformities, 1+ edema to knees. LUE swelling >>RUE Skin: Diffuse ecchymosis and scattered maceration BUE. Warm, clean  GU:  Bloody urine collecting in foley bag   Assessment & Plan:   Acute encephalopathy -in setting of septic shock  P -delirium precautions   Septic shock 2/2 presumed UTI -- numerous E. Coli UTI infections in past with severe urosepsis WBC is 5.6, T Max 99.3 P -Continue cefepime, vanc added on 7/28 given worsening shock -  Off levo at present  - Peripheral pressors for SBP > 90 - continue IVF, albumin.  - Started midodrine   Coagulopathy - Unlikely DIC given high fibrinogen -s/p Vit K  Thrombocytopenia. -likely in setting of critical illness Anemia, worsening again overnight to 6.3 Transfused I additional unit overnight 7/30 hematuria, no obvious bleeding otherwise  - s/p 1 PRBC overnight, additional unit 7/28 - Pt with history of low copper values P - Trend CBC  - Monitor for bleeding - Transfuse for HGB of > 7 - Check INR - Consider hematology consult - Consider adding Ceruloplastin Level   Hypoglycemia -improving? P -continue D5 fluids, esp while npo -liver function is improving>> continue to trend - CBG's   AKI Hyperkalemia: resolved Hematuria Non-obstructing renal calculi on CT  P -continue IVF -trend renal indices, improving -uop 1800 cc's last 24  Hx PAF (on eliquis), dCHF. - Hold home eliquis for now. - Hold home diltiazem, furosemide, spironolactone.  Hx hypothyroidism. - Continue home synthroid.  Hx COPD. - Continue BD's.  Sacral wound present on admission P -WOCN  Goals of Care -DNR/DNI - GOC discussed today with patient's brother HCPOA-- pt brother.  - Discussed with patient's brother , will consult Palliative  Care today  Best Practice:  Diet: NPO for mbs Pain/Anxiety/Delirium protocol (if indicated): N/A. VAP protocol (if indicated): N/A. DVT prophylaxis: SCD's only. GI prophylaxis: None. Glucose control: None. Mobility: Bedrest. Code Status: DNR. Family Communication: .  Disposition: ICU.  Labs   CBC: Recent Labs  Lab 12/20/19 1323 12/20/19 1323 12/20/19 1755 12/20/19 1755 12/21/19 0230 12/21/19 1018 12/22/19 0249 12/23/19 0343 12/23/19 0935  WBC 6.5   < > 6.6  --  9.8  --  7.4 5.3 5.6  NEUTROABS 5.9  --   --   --   --   --  5.8  --   --   HGB 8.0*   < > 6.7*  --  9.5*  --  7.2* 6.2* 7.2*  HCT 26.3*   < > 22.5*  --  30.3*  --  23.5* 20.9* 24.5*  MCV 93.3   < > 93.0  --  91.5  --  94.4 96.8 92.1  PLT PLATELET CLUMPS NOTED ON SMEAR, COUNT APPEARS DECREASED   < > 25*   < > 47* PLATELET CLUMPS NOTED ON SMEAR, COUNT APPEARS DECREASED 34* 23* 19*   < > = values in this interval not displayed.   Basic Metabolic Panel: Recent Labs  Lab 12/20/19 1323 12/21/19 0230 12/21/19 1018 12/22/19 0249 12/23/19 8101  NA 135 138 138 141 145  K 5.3* 5.3* 4.9 4.1 3.5  CL 101 105 108 110 116*  CO2 20* 22 19* 19* 20*  GLUCOSE 59* 55* 80 314* 271*  BUN 59* 53* 52* 43* 37*  CREATININE 3.36* 2.98* 3.01* 2.57* 2.35*  CALCIUM 7.6* 7.7* 7.5* 7.3* 7.8*  MG  --  2.6*  --  2.0  --   PHOS  --  5.2*  --  4.0  --    GFR: Estimated Creatinine Clearance: 29.9 mL/min (A) (by C-G formula based on SCr of 2.35 mg/dL (H)). Recent Labs  Lab 12/20/19 1323 12/20/19 1735 12/20/19 1755 12/21/19 0230 12/22/19 0249 12/23/19 0343 12/23/19 0935  WBC 6.5  --    < > 9.8 7.4 5.3 5.6  LATICACIDVEN 0.7 1.4  --   --   --   --   --    < > = values in this interval not displayed.   Liver Function Tests: Recent Labs  Lab 12/20/19 1323 12/21/19 1018 12/22/19 0249 12/23/19 0343  AST 113* 78*  --  40  ALT 91* 67*  --  40  ALKPHOS 250* 220*  --  162*  BILITOT 1.0 1.0  --  0.8  PROT 4.8* 4.8*  --  4.1*    ALBUMIN 1.4* 1.7* 1.7* 1.4*   No results for input(s): LIPASE, AMYLASE in the last 168 hours. No results for input(s): AMMONIA in the last 168 hours. ABG    Component Value Date/Time   PHART 7.445 10/17/2019 2015   PCO2ART 29.0 (L) 10/17/2019 2015   PO2ART 106 10/17/2019 2015   HCO3 19.7 (L) 10/17/2019 2015   TCO2 21 (L) 10/14/2019 1338   ACIDBASEDEF 3.8 (H) 10/17/2019 2015   O2SAT 95.9 10/17/2019 2015    Coagulation Profile: Recent Labs  Lab 12/20/19 1434 12/21/19 0230 12/21/19 1018 12/22/19 0249  INR >10.0* 3.0* 2.3* 1.7*   Cardiac Enzymes: Recent Labs  Lab 12/21/19 1526  CKTOTAL 51   HbA1C: No results found for: HGBA1C CBG: Recent Labs  Lab 12/22/19 1536 12/22/19 2015 12/23/19 0015 12/23/19 0414 12/23/19 0834  GLUCAP 90 105* 95 90 105*     Critical care time: 40 minutes APP time    Magdalen Spatz, MSN, AGACNP-BC [place] for personal pager PCCM on call pager [phone] 12/23/2019, 11:38 AM

## 2019-12-23 NOTE — TOC Initial Note (Addendum)
Transition of Care [facility]) - Initial/Assessment Note    Patient Details  Name: [name] MRN: [id number] Date of Birth: [birth year]  Transition of [facility]) CM/SW Contact:    Trula Ore, [place] Phone Number: 12/23/2019, 4:14 PM  Clinical Narrative:                  CSW spoke with patient at bedside. Patient is agreeable to SNF placement. Patient came from [facility]. Patient was there for short term rehab. Patient wants to go to a different SNF. Patient gave CSW permission to fax out initial referral to [place] area. Patient gave CSW permission to discuss her care with her brother [name].  Pending bed offers. CSW will need to start insurance authorization closer to being medically ready.  TOC team will continue to follow.  Expected Discharge Plan: Skilled Nursing Facility Barriers to Discharge: Continued Medical Work up   Patient Goals and CMS Choice Patient states their goals for this hospitalization and ongoing recovery are:: SNF CMS Medicare.gov Compare Post Acute Care list provided to:: Patient Choice offered to / list presented to : Patient  Expected Discharge Plan and Services Expected Discharge Plan: [place]                                              Prior Living Arrangements/Services     Patient language and need for interpreter reviewed:: Yes Do you feel safe going back to the place where you live?: No   SNF  Need for Family Participation in Patient Care: Yes (Comment) Care giver support system in place?: Yes (comment)   Criminal Activity/Legal Involvement Pertinent to Current Situation/Hospitalization: No - Comment as needed  Activities of Daily Living Home Assistive Devices/Equipment: Wheelchair (comes from a SNF) ADL Screening (condition at time of admission) Patient's cognitive ability adequate to safely complete daily activities?: No Is the patient deaf or have difficulty hearing?: No Does the patient have  difficulty seeing, even when wearing glasses/contacts?: No Does the patient have difficulty concentrating, remembering, or making decisions?: No Patient able to express need for assistance with ADLs?: Yes Does the patient have difficulty dressing or bathing?: Yes Independently performs ADLs?: No Communication: Independent Dressing (OT): Dependent Is this a change from baseline?: Change from baseline, expected to last >3 days Grooming: Needs assistance Is this a change from baseline?: Change from baseline, expected to last >3 days Feeding: Needs assistance Is this a change from baseline?: Change from baseline, expected to last >3 days Bathing: Dependent Is this a change from baseline?: Change from baseline, expected to last >3 days Toileting: Dependent Is this a change from baseline?: Change from baseline, expected to last >3days In/Out Bed: Dependent Is this a change from baseline?: Change from baseline, expected to last >3 days Walks in Home: Dependent Is this a change from baseline?: Change from baseline, expected to last >3 days Does the patient have difficulty walking or climbing stairs?: Yes Weakness of Legs: Both Weakness of Arms/Hands: Both  Permission Sought/Granted Permission sought to share information with : Case Manager, Family Supports, Customer service manager Permission granted to share information with : Yes, Verbal Permission Granted  Share Information with NAME: [name]  Permission granted to share info w AGENCY: SNF  Permission granted to share info w Relationship: Brother  Permission granted to share info w Contact Information: [name] [phone]  Emotional  Assessment Appearance:: Appears stated age Attitude/Demeanor/Rapport: Gracious Affect (typically observed): Calm Orientation: : Oriented to Self, Oriented to Place, Oriented to  Time Alcohol / Substance Use: Not Applicable Psych Involvement: No (comment)  Admission diagnosis:  AKI (acute kidney injury)  ([place]) [N17.9] Sepsis ([place]) [A41.9] Hypotension, unspecified hypotension type [I95.9] Patient Active Problem List   Diagnosis Date Noted  . Pressure injury of coccygeal region, unstageable ([place]) 12/21/2019  . Knee pain   . Hypothermia 10/15/2019  . Thrombocytopenia ([place]) 10/15/2019  . AKI (acute kidney injury) ([place]) 10/15/2019  . Mild neurocognitive disorder 06/28/2019  . Major depressive disorder   . Generalized anxiety disorder   . Chronic anticoagulation 12/16/2018  . Abnormal nuclear stress test 12/16/2018  . Debilitated patient 12/16/2018  . Pressure injury of skin 12/13/2018  . UTI (urinary tract infection) 12/13/2018  . Pyelonephritis 11/21/2018  . Sepsis due to Escherichia coli (E. coli) ([place]) 11/28/2017  . E coli bacteremia 11/28/2017  . Sepsis ([place]) 11/24/2017  . Hypokalemia 12/10/2016  . Hypocalcemia 12/10/2016  . Recurrent falls 12/09/2016  . Right ureteral stone 07/17/2014  . Osteomyelitis ([place]) 06/14/2014  . Pernicious anemia 02/20/2014  . Reflux esophagitis 02/02/2014  . Edema 11/23/2013  . Foot pain 03/31/2013  . Endometrial adenocarcinoma ([place]) 02/21/2013  . Trochanteric bursitis of left hip 09/13/2012  . Muscle cramps 08/12/2012  . Fatty liver 01/07/2012  . Mid back pain 09/30/2011  . Weight gain 07/02/2011  . Nonspecific abnormal unspecified cardiovascular function study 04/30/2011  . Hx of papillary thyroid carcinoma 04/07/2011  . Low back pain 04/01/2011  . PAF (paroxysmal atrial fibrillation) 02/05/2011  . Hypoparathyroidism ([place]) 01/07/2011  . GERD (gastroesophageal reflux disease) 05/22/2009  . Leukocytosis 03/24/2009  . Morbid obesity ([place]) 12/20/2008  . Rhinosinusitis, recurrent 09/07/2008  . Vitamin D deficiency 05/10/2007  . Iron deficiency anemia secondary to malabsorption  05/10/2007  . Anemia 05/10/2007  . Hypothyroidism 01/05/2007  . Essential hypertension 01/05/2007  . Asthma 01/05/2007   PCP:  Debbrah Alar, NP Pharmacy:    [facility], [address], [address] [place], [address] [place] [address] Phone: [phone] Fax: [phone]  [id number] [address], [address] [place] [address] Phone: [phone] Fax: [phone]     Social Determinants of Health (SDOH) Interventions    Readmission Risk Interventions Readmission Risk Prevention Plan 12/13/2018  Transportation Screening Complete  PCP or Specialist Appt within 3-5 Days Complete  HRI or [place] Complete  Social Work Consult for [place] Planning/Counseling Complete  Palliative Care Screening Not Applicable  Medication Review (RN Care Manager) Referral to Pharmacy  Some recent data might be hidden

## 2019-12-23 NOTE — Progress Notes (Signed)
  Speech Language Pathology Treatment: Dysphagia  Patient Details Name: [name] MRN: [id number] DOB: August 02, 1955 Today's Date: 12/23/2019 Time: 4098-1191 SLP Time Calculation (min) (ACUTE ONLY): 16 min  Assessment / Plan / Recommendation Clinical Impression  Pt was seen for dysphagia treatment and was cooperative throughout the session. RN indicated after the session that the pt's family has been giving her trials of thin liquids despite education from staff regarding her risk of aspiration and complications with this consistency. [name] was present and reported that the pt demonstrated "gagging and choking" yesterday once the pureed meat entered her mouth. Pt was drinking nectar thick liquids via straw in a reclined position upon SLP's entry and pt's family was re-educated regarding her increased risk for aspiration in this position. Pt tolerated dysphagia 2 solids and nectar thick liquids without overt s/sx of aspiration but exhibited coughing with dysphagia 3 solids and mastication was prolonged with this consistency. [name] indicated that the family would still like the swallow study to be deferred. Pt's diet will be advanced to dysphagia 2 solids with nectar thick liquids. SLP will continue to follow pt.    HPI HPI: Pt is a 64 y.o. female who presented to [facility] ED 7/27 from rehab due to left-sided abdominal discomfort and generalized weakness. She had recent admission 5/21-5/29 for sepsis secondary to E.coli UTI. Pt was confused on 7/27 and UA confirmed UTI. CT of the head was negative for acute changes. MBS 5/26: oropharyngeal dysphagia was inconsistent in relation to impaired timing and coordination of swallow sequence resulting in penetration/aspiration. A dysphagia 3 diet with honey thick liquids was recommended at that time. Pt's daughter reported that the pt was advanced to nectar thick liquids and ultimately thin liquids at the SNF and has been on thin liquids for 2-3 weeks. CT  abdomen: Heterogeneous confluent consolidation in the right lower lobe. There additional patchy opacities in the left lower and right middle lobes. Small right and trace left pleural effusions.      SLP Plan  Continue with current plan of care       Recommendations  Diet recommendations: Dysphagia 2 (fine chop);Nectar-thick liquid Liquids provided via: Cup;Straw Medication Administration: Crushed with puree Supervision: Full supervision/cueing for compensatory strategies;Staff to assist with self feeding Compensations: Slow rate;Small sips/bites;Follow solids with liquid Postural Changes and/or Swallow Maneuvers: Seated upright 90 degrees;Upright 30-60 min after meal                Oral Care Recommendations: Oral care BID Follow up Recommendations:  (TBD) SLP Visit Diagnosis: Dysphagia, pharyngeal phase (R13.13) Plan: Continue with current plan of care       Gwen Sarvis I. Hardin Negus, [place], [place] Office number [phone] Pager [place] 12/23/2019, 5:53 PM

## 2019-12-23 NOTE — Progress Notes (Signed)
Pharmacy Antibiotic Note  [name] is a 64 y.o. female admitted on 12/20/2019 with sepsis and UTI.  Pharmacy has been consulted for Cefepime and vancomycin dosing. -WBC= 5.3, afebrile, cultures- ngtd  Plan:  - Cefepime 2g IV q24h  -vancomycin 1g IV q24 hours - Monitor patients renal function and urine output  - Could consider d/c vancomycin?  Weight: (!) 108.9 kg (240 lb 1.3 oz)  Temp (24hrs), Avg:98 F (36.7 C), Min:97.2 F (36.2 C), Max:98.6 F (37 C)  Recent Labs  Lab 12/20/19 1323 12/20/19 1735 12/20/19 1755 12/21/19 0230 12/21/19 1018 12/22/19 0249 12/23/19 0343  WBC 6.5  --  6.6 9.8  --  7.4 5.3  CREATININE 3.36*  --   --  2.98* 3.01* 2.57* 2.35*  LATICACIDVEN 0.7 1.4  --   --   --   --   --     Estimated Creatinine Clearance: 29.9 mL/min (A) (by C-G formula based on SCr of 2.35 mg/dL (H)).    Allergies  Allergen Reactions  . Other Anaphylaxis    Reaction to tree nuts  . Amoxicillin-Pot Clavulanate Other (See Comments)    headache  . Ciprofloxacin Nausea Only and Rash  . Food Hives and Other (See Comments)    Potato- can eat them but cannot handle them, causes rash  . Keflex [Cephalexin] Rash  . Penicillins Rash    Headache. And hives - Dose not tolerate Cephalosporin either   Has patient had a PCN reaction causing immediate rash, facial/tongue/throat swelling, SOB or lightheadedness with hypotension: No Has patient had a PCN reaction causing severe rash involving mucus membranes or skin necrosis: No Has patient had a PCN reaction that required hospitalization: No Has patient had a PCN reaction occurring within the last 10 years: Yes (reaction was May 2020) If all of the above answers are "NO", then may proce  . Rocephin [Ceftriaxone] Rash  . Tomato Hives    can eat them but cannot handle them, causes rash    Antimicrobials this admission: Cefepime 7/27>>  Vancomycin 7/28>>  Dose adjustments this admission: N/a  Microbiology results: 7/27  urine - mult sp. 7/27 blood x2- ngtd MRSA Pcr +   Thank you for allowing pharmacy to be a part of this patient's care.  Hildred Laser, PharmD Clinical Pharmacist **Pharmacist phone directory can now be found on [email or web address] ([email or web address]).  Listed under [place].

## 2019-12-23 NOTE — Progress Notes (Signed)
[facility] Progress Note Patient Name: PARI LOMBARD DOB: 23-Mar-1956 MRN: [id number]   Date of Service  12/23/2019  HPI/Events of Note  Hemoglobin 6.2, Platelet count 23 K  eICU Interventions  Transfuse 1 unit PRBC.        Kerry Kass Lenardo [place] 12/23/2019, 5:26 AM

## 2019-12-24 DIAGNOSIS — E440 Moderate protein-calorie malnutrition: Secondary | ICD-10-CM | POA: Diagnosis present

## 2019-12-24 LAB — COMPREHENSIVE METABOLIC PANEL
ALT: 35 U/L (ref 0–44)
AST: 33 U/L (ref 15–41)
Albumin: 1.5 g/dL — ABNORMAL LOW (ref 3.5–5.0)
Alkaline Phosphatase: 159 U/L — ABNORMAL HIGH (ref 38–126)
Anion gap: 8 (ref 5–15)
BUN: 32 mg/dL — ABNORMAL HIGH (ref 8–23)
CO2: 20 mmol/L — ABNORMAL LOW (ref 22–32)
Calcium: 8.2 mg/dL — ABNORMAL LOW (ref 8.9–10.3)
Chloride: 117 mmol/L — ABNORMAL HIGH (ref 98–111)
Creatinine, Ser: 1.99 mg/dL — ABNORMAL HIGH (ref 0.44–1.00)
GFR calc Af Amer: 30 mL/min — ABNORMAL LOW (ref >60)
GFR calc non Af Amer: 26 mL/min — ABNORMAL LOW (ref >60)
Glucose, Bld: 97 mg/dL (ref 70–99)
Potassium: 3.5 mmol/L (ref 3.5–5.1)
Sodium: 145 mmol/L (ref 135–145)
Total Bilirubin: 0.5 mg/dL (ref 0.3–1.2)
Total Protein: 4.3 g/dL — ABNORMAL LOW (ref 6.5–8.1)

## 2019-12-24 LAB — TYPE AND SCREEN
ABO/RH(D): O POS
ABO/RH(D): O POS
Antibody Screen: NEGATIVE
Antibody Screen: NEGATIVE
Unit division: 0
Unit division: 0
Unit division: 0

## 2019-12-24 LAB — CBC
HCT: 27.8 % — ABNORMAL LOW (ref 36.0–46.0)
Hemoglobin: 8.6 g/dL — ABNORMAL LOW (ref 12.0–15.0)
MCH: 28.7 pg (ref 26.0–34.0)
MCHC: 30.9 g/dL (ref 30.0–36.0)
MCV: 92.7 fL (ref 80.0–100.0)
Platelets: 23 10*3/uL — CL (ref 150–400)
RBC: 3 MIL/uL — ABNORMAL LOW (ref 3.87–5.11)
RDW: 23.6 % — ABNORMAL HIGH (ref 11.5–15.5)
WBC: 7.6 10*3/uL (ref 4.0–10.5)
nRBC: 0 % (ref 0.0–0.2)

## 2019-12-24 LAB — ADAMTS13 ACTIVITY REFLEX

## 2019-12-24 LAB — BPAM RBC
Blood Product Expiration Date: 202108292359
Blood Product Expiration Date: 202108292359
Blood Product Expiration Date: 202108062359
ISSUE DATE / TIME: 202107271930
ISSUE DATE / TIME: 202107300553
ISSUE DATE / TIME: 202107301827
Unit Type and Rh: 5100
Unit Type and Rh: 5100
Unit Type and Rh: 5100

## 2019-12-24 LAB — GLUCOSE, CAPILLARY
Glucose-Capillary: 102 mg/dL — ABNORMAL HIGH (ref 70–99)
Glucose-Capillary: 119 mg/dL — ABNORMAL HIGH (ref 70–99)
Glucose-Capillary: 261 mg/dL — ABNORMAL HIGH (ref 70–99)
Glucose-Capillary: 71 mg/dL (ref 70–99)
Glucose-Capillary: 72 mg/dL (ref 70–99)
Glucose-Capillary: 75 mg/dL (ref 70–99)
Glucose-Capillary: 86 mg/dL (ref 70–99)
Glucose-Capillary: 98 mg/dL (ref 70–99)

## 2019-12-24 LAB — CORTISOL: Cortisol, Plasma: 13 ug/dL

## 2019-12-24 LAB — HAPTOGLOBIN: Haptoglobin: 177 mg/dL (ref 37–355)

## 2019-12-24 LAB — ADAMTS13 ACTIVITY: Adamts 13 Activity: 57.7 % — ABNORMAL LOW (ref >66.8)

## 2019-12-24 LAB — CERULOPLASMIN: Ceruloplasmin: 18.2 mg/dL — ABNORMAL LOW (ref 19.0–39.0)

## 2019-12-24 NOTE — Progress Notes (Signed)
Unable to replace K+ 3.5 d/t creatinine 1.99, GFR 26

## 2019-12-24 NOTE — Progress Notes (Signed)
NAME:  [name], MRN:  [id number], DOB:  1956-04-20, LOS: 4 ADMISSION DATE:  12/20/2019, CONSULTATION DATE:  12/20/19 REFERRING MD:  Francia Greaves  CHIEF COMPLAINT:  abd pain, weakness  Brief History   [name] is a 64 y.o. female who was admitted 7/27 with urosepsis.  She had recent admission 10/14/19 - 10/22/19 for sepsis 2/2 E.coli UTI.  She was discharged to rehab facility after that admission for ongoing rehab.  Per family, she had a two week decline prior to admit with weakness, unable to get out of bed, confusion. Found to have UTI, multiple metabolic derangements, anemia with significant coagulopathy (INR > 10).  She was given 10mg  vitamin K and is s/p 1 u RBC.  Besides bloody urine, there has been no obvious bleeding.  Past Medical History  has Hypothyroidism; Vitamin D deficiency; Morbid obesity ([place]); Iron deficiency anemia secondary to malabsorption ; Anemia; Leukocytosis; Essential hypertension; Rhinosinusitis, recurrent; Asthma; GERD (gastroesophageal reflux disease); Hypoparathyroidism ([place]); PAF (paroxysmal atrial fibrillation); Low back pain; Hx of papillary thyroid carcinoma; Nonspecific abnormal unspecified cardiovascular function study; Weight gain; Mid back pain; Fatty liver; Muscle cramps; Trochanteric bursitis of left hip; Endometrial adenocarcinoma ([place]); Foot pain; Edema; Reflux esophagitis; Pernicious anemia; Osteomyelitis ([place]); Right ureteral stone; Recurrent falls; Hypokalemia; Hypocalcemia; Sepsis ([place]); Sepsis due to Escherichia coli (E. coli) ([place]); E coli bacteremia; Pyelonephritis; Pressure injury of skin; UTI (urinary tract infection); Chronic anticoagulation; Abnormal nuclear stress test; Debilitated patient; Mild neurocognitive disorder; Major depressive disorder; Generalized anxiety disorder; Hypothermia; Thrombocytopenia ([place]); AKI (acute kidney injury) ([place]); Knee pain; Pressure injury of coccygeal region, unstageable ([place]); and Malnutrition of moderate degree  on their problem list.  Significant Hospital Events   7/27 Admit. 7/28 Increasing pressor requirement despite fluid resuscitation 7/30 Off pressors, HGB drop to 6.2 overnight > 1 PRBC  Consults:     Procedures:     Significant Diagnostic Tests:  CT abd / pelv 7/27 > no hemorrhage, third spacing, bilateral non-obstructing renal calculi, hepatosplenomegaly. CT head 7/27 > neg.  Micro Data:  COVID 7/27 > negative Blood 7/27 >  Urine 7/27 >  MRSA 7/28 > pos  Antimicrobials:  Cefepime 7/27 >   Vancomycin 7/28 >  Interim history/subjective:  RN reports pt remains off vasopressors Afebrile / WBC 7.6 On room air  Glucose range 72 - 237  Objective:  Blood pressure (!) 109/58, pulse 73, temperature (!) 97.5 F (36.4 C), temperature source Oral, resp. rate 15, height 5\' 4"  (1.626 m), weight (!) 111 kg, last menstrual period 06/11/2012, SpO2 97 %.        Intake/Output Summary (Last 24 hours) at 12/24/2019 1156 Last data filed at 12/24/2019 1100 Gross per 24 hour  Intake 2233.51 ml  Output 1505 ml  Net 728.51 ml   Filed Weights   12/21/19 0240 12/23/19 0500 12/24/19 0500  Weight: (!) 106.9 kg (!) 108.9 kg (!) 111 kg    Examination: General: chronically ill appearing adult female lying in bed in NAD, sister-in-law at bedsdie  HEENT: MM pink/moist, good dentition  Neuro: AAOx4, periods of confusion but largely intact, MAE CV: s1s2 rrr, no m/r/g PULM: non-labored on RA, lungs bilaterally clear  GI: soft, bsx4 active  Extremities: warm/dry, 1+ BLE pitting edema  Skin: thin skin, multiple areas of ecchymosis, sacral dressing intact  GU: yellow to light tea colored urine in bag, no hematuria   Assessment & Plan:   Septic Shock in setting of UTI  Numerous E. Coli UTI infections in past  with severe urosepsis -continue abx as above  -discontinue vanco  -continue midodrine, consider reduction / taper if BP remains stable 8/1 -transfer to progressive care   Acute  Metabolic Encephalopathy In setting of septic shock  -supportive care -promotes sleep / wake cycle -minimize sedating medications -follow up copper, selenium etc   Coagulopathy  Thrombocytopenia  Anemia  Doubt DIC given high fibrinogen, s/p Vit K, PRBC, hx of low copper  -trend CBC, INR, LFT's  -transfuse for Hgb <7% -monitor for bleeding  -follow up ADAMS13   Hypoglycemia   -continue D5LR at 50 ml/hr  -CBG Q4   AKI Hyperkalemia  Hematuria Non-obstructing renal calculi on CT  -Trend BMP / urinary output -Replace electrolytes as indicated -Avoid nephrotoxic agents, ensure adequate renal perfusion  Hx PAF (on eliquis), dCHF -hold home eliquis with hematuria  -hold home diltiazem, furosemide, spironolactone   Hypothyroidism TSH 0.193 7/27 -continue synthroid  Hx COPD -continue bronchodilators   Sacral wound present on admission -wound care per WOC recommendations   Goals of Care -DNR/DNI -brother is HCPOA  -Palliative care consult pending.  Patients family have multiple questions about her overall decline.  She currently appears to have rallied in terms of her acute illness. They see that she has declined and will not get back to her prior baseline.  She has not lived at home since before May 2021 and has not driven in over a year.  She has had frequent hospitalizations.  Her family expresses that they do not want her to suffer and indicate that when her time comes they hope to be able to have hospice in her home.    Best Practice:  Diet: NPO   Pain/Anxiety/Delirium protocol (if indicated): N/A. VAP protocol (if indicated): N/A. DVT prophylaxis: SCD's only. GI prophylaxis: None. Glucose control: None. Mobility: Bedrest. Code Status: DNR. Family Communication:  Disposition: Progressive care.  To [facility] as of 8/1  Labs   CBC: Recent Labs  Lab 12/20/19 1323 12/20/19 1755 12/22/19 0249 12/22/19 0249 12/23/19 0343 12/23/19 0935 12/23/19 1500 12/23/19 2323  12/24/19 0409  WBC 6.5   < > 7.4   < > 5.3 5.6 5.0 6.3 7.6  NEUTROABS 5.9  --  5.8  --   --   --   --   --   --   HGB 8.0*   < > 7.2*   < > 6.2* 7.2* 6.7* 8.0* 8.6*  HCT 26.3*   < > 23.5*   < > 20.9* 24.5* 22.7* 25.7* 27.8*  MCV 93.3   < > 94.4   < > 96.8 92.1 91.5 92.1 92.7  PLT PLATELET CLUMPS NOTED ON SMEAR, COUNT APPEARS DECREASED   < > 34*   < > 23* 19* 23* 26* 23*   < > = values in this interval not displayed.   Basic Metabolic Panel: Recent Labs  Lab 12/21/19 0230 12/21/19 1018 12/22/19 0249 12/23/19 0343 12/24/19 0409  NA 138 138 141 145 145  K 5.3* 4.9 4.1 3.5 3.5  CL 105 108 110 116* 117*  CO2 22 19* 19* 20* 20*  GLUCOSE 55* 80 314* 271* 97  BUN 53* 52* 43* 37* 32*  CREATININE 2.98* 3.01* 2.57* 2.35* 1.99*  CALCIUM 7.7* 7.5* 7.3* 7.8* 8.2*  MG 2.6*  --  2.0  --   --   PHOS 5.2*  --  4.0  --   --    GFR: Estimated Creatinine Clearance: 35.7 mL/min (A) (by C-G formula based on SCr  of 1.99 mg/dL (H)). Recent Labs  Lab 12/20/19 1323 12/20/19 1735 12/20/19 1755 12/23/19 0935 12/23/19 1500 12/23/19 2323 12/24/19 0409  WBC 6.5  --    < > 5.6 5.0 6.3 7.6  LATICACIDVEN 0.7 1.4  --   --   --   --   --    < > = values in this interval not displayed.   Liver Function Tests: Recent Labs  Lab 12/20/19 1323 12/21/19 1018 12/22/19 0249 12/23/19 0343 12/24/19 0409  AST 113* 78*  --  40 33  ALT 91* 67*  --  40 35  ALKPHOS 250* 220*  --  162* 159*  BILITOT 1.0 1.0  --  0.8 0.5  PROT 4.8* 4.8*  --  4.1* 4.3*  ALBUMIN 1.4* 1.7* 1.7* 1.4* 1.5*   No results for input(s): LIPASE, AMYLASE in the last 168 hours. No results for input(s): AMMONIA in the last 168 hours. ABG    Component Value Date/Time   PHART 7.445 10/17/2019 2015   PCO2ART 29.0 (L) 10/17/2019 2015   PO2ART 106 10/17/2019 2015   HCO3 19.7 (L) 10/17/2019 2015   TCO2 21 (L) 10/14/2019 1338   ACIDBASEDEF 3.8 (H) 10/17/2019 2015   O2SAT 95.9 10/17/2019 2015    Coagulation Profile: Recent Labs  Lab  12/20/19 1434 12/21/19 0230 12/21/19 1018 12/22/19 0249 12/23/19 1257  INR >10.0* 3.0* 2.3* 1.7* 1.4*   Cardiac Enzymes: Recent Labs  Lab 12/21/19 1526  CKTOTAL 51   HbA1C: No results found for: HGBA1C CBG: Recent Labs  Lab 12/24/19 0011 12/24/19 0416 12/24/19 0418 12/24/19 0654 12/24/19 1108  GLUCAP 102* 261* 75 72 119*     Critical care time:     Noe Gens, MSN, NP-C [facility] 12/24/2019, 12:12 PM   Please see [email or web address] for pager details.

## 2019-12-25 DIAGNOSIS — E440 Moderate protein-calorie malnutrition: Secondary | ICD-10-CM

## 2019-12-25 DIAGNOSIS — A4151 Sepsis due to Escherichia coli [E. coli]: Secondary | ICD-10-CM

## 2019-12-25 DIAGNOSIS — I959 Hypotension, unspecified: Secondary | ICD-10-CM

## 2019-12-25 LAB — BASIC METABOLIC PANEL
Anion gap: 9 (ref 5–15)
BUN: 34 mg/dL — ABNORMAL HIGH (ref 8–23)
CO2: 19 mmol/L — ABNORMAL LOW (ref 22–32)
Calcium: 8.4 mg/dL — ABNORMAL LOW (ref 8.9–10.3)
Chloride: 111 mmol/L (ref 98–111)
Creatinine, Ser: 1.84 mg/dL — ABNORMAL HIGH (ref 0.44–1.00)
GFR calc Af Amer: 33 mL/min — ABNORMAL LOW (ref >60)
GFR calc non Af Amer: 29 mL/min — ABNORMAL LOW (ref >60)
Glucose, Bld: 92 mg/dL (ref 70–99)
Potassium: 4 mmol/L (ref 3.5–5.1)
Sodium: 139 mmol/L (ref 135–145)

## 2019-12-25 LAB — URINE CULTURE: Culture: >=100000 — AB

## 2019-12-25 LAB — GLUCOSE, CAPILLARY
Glucose-Capillary: 70 mg/dL (ref 70–99)
Glucose-Capillary: 69 mg/dL — ABNORMAL LOW (ref 70–99)
Glucose-Capillary: 76 mg/dL (ref 70–99)
Glucose-Capillary: 64 mg/dL — ABNORMAL LOW (ref 70–99)
Glucose-Capillary: 91 mg/dL (ref 70–99)
Glucose-Capillary: 71 mg/dL (ref 70–99)

## 2019-12-25 LAB — CBC
HCT: 25.6 % — ABNORMAL LOW (ref 36.0–46.0)
Hemoglobin: 7.9 g/dL — ABNORMAL LOW (ref 12.0–15.0)
MCH: 27.9 pg (ref 26.0–34.0)
MCHC: 30.9 g/dL (ref 30.0–36.0)
MCV: 90.5 fL (ref 80.0–100.0)
Platelets: 33 10*3/uL — ABNORMAL LOW (ref 150–400)
RBC: 2.83 MIL/uL — ABNORMAL LOW (ref 3.87–5.11)
RDW: 23.4 % — ABNORMAL HIGH (ref 11.5–15.5)
WBC: 8.6 10*3/uL (ref 4.0–10.5)
nRBC: 0 % (ref 0.0–0.2)

## 2019-12-25 LAB — CULTURE, BLOOD (ROUTINE X 2)
Culture: NO GROWTH
Culture: NO GROWTH
Special Requests: ADEQUATE

## 2019-12-25 LAB — COMPREHENSIVE METABOLIC PANEL
ALT: 30 U/L (ref 0–44)
AST: 22 U/L (ref 15–41)
Albumin: 1.3 g/dL — ABNORMAL LOW (ref 3.5–5.0)
Alkaline Phosphatase: 142 U/L — ABNORMAL HIGH (ref 38–126)
Anion gap: 8 (ref 5–15)
BUN: 32 mg/dL — ABNORMAL HIGH (ref 8–23)
CO2: 20 mmol/L — ABNORMAL LOW (ref 22–32)
Calcium: 8.3 mg/dL — ABNORMAL LOW (ref 8.9–10.3)
Chloride: 113 mmol/L — ABNORMAL HIGH (ref 98–111)
Creatinine, Ser: 1.76 mg/dL — ABNORMAL HIGH (ref 0.44–1.00)
GFR calc Af Amer: 35 mL/min — ABNORMAL LOW (ref >60)
GFR calc non Af Amer: 30 mL/min — ABNORMAL LOW (ref >60)
Glucose, Bld: 238 mg/dL — ABNORMAL HIGH (ref 70–99)
Potassium: 3.9 mmol/L (ref 3.5–5.1)
Sodium: 141 mmol/L (ref 135–145)
Total Bilirubin: 0.9 mg/dL (ref 0.3–1.2)
Total Protein: 3.8 g/dL — ABNORMAL LOW (ref 6.5–8.1)

## 2019-12-25 LAB — COPPER, SERUM: Copper: 83 ug/dL (ref 80–158)

## 2019-12-25 LAB — ZINC: Zinc: 73 ug/dL (ref 44–115)

## 2019-12-25 MED ORDER — DEXTROSE 50 % IV SOLN
INTRAVENOUS | Status: AC
  Administered 2019-12-25: 25 mL
  Filled 2019-12-25: qty 50

## 2019-12-25 MED ORDER — FAMOTIDINE 20 MG PO TABS
20.0000 mg | ORAL_TABLET | Freq: Every day | ORAL | Status: DC
  Administered 2019-12-25 – 2019-12-27 (×3): 20 mg via ORAL
  Filled 2019-12-25 (×3): qty 1

## 2019-12-25 MED ORDER — BISMUTH SUBSALICYLATE 262 MG/15ML PO SUSP
30.0000 mL | Freq: Once | ORAL | Status: AC
  Administered 2019-12-25: 30 mL via ORAL
  Filled 2019-12-25: qty 236

## 2019-12-25 MED ORDER — SODIUM CHLORIDE 0.9 % IV SOLN
2.0000 g | Freq: Two times a day (BID) | INTRAVENOUS | Status: AC
  Administered 2019-12-25 – 2019-12-26 (×3): 2 g via INTRAVENOUS
  Filled 2019-12-25 (×3): qty 2

## 2019-12-25 MED ORDER — FLUCONAZOLE 100MG IVPB
100.0000 mg | INTRAVENOUS | Status: AC
  Administered 2019-12-25 – 2019-12-27 (×3): 100 mg via INTRAVENOUS
  Filled 2019-12-25 (×3): qty 50

## 2019-12-25 MED ORDER — ONDANSETRON HCL 4 MG/2ML IJ SOLN
4.0000 mg | Freq: Four times a day (QID) | INTRAMUSCULAR | Status: DC | PRN
  Filled 2019-12-25: qty 2

## 2019-12-25 MED ORDER — MIDODRINE HCL 5 MG PO TABS
5.0000 mg | ORAL_TABLET | Freq: Three times a day (TID) | ORAL | Status: DC
  Administered 2019-12-25 – 2019-12-27 (×5): 5 mg via ORAL
  Filled 2019-12-25 (×5): qty 1

## 2019-12-25 MED ORDER — DEXTROSE-NACL 5-0.45 % IV SOLN: INTRAVENOUS | Status: DC

## 2019-12-25 MED ORDER — SODIUM CHLORIDE 0.9 % IV BOLUS
1000.0000 mL | Freq: Once | INTRAVENOUS | Status: AC
  Administered 2019-12-25: 1000 mL via INTRAVENOUS

## 2019-12-25 NOTE — NC FL2 (Signed)
[place] LEVEL OF CARE SCREENING TOOL     IDENTIFICATION  Patient Name: [name] Birthdate: [birth year] Sex: female Admission Date (Current Location): 12/20/2019  [facility] and [place] Number:  Herbalist and Address:  [facility] [place], [place] [address], [place], [place] [address]      Provider Number: [id number]  Attending Physician Name and Address:  Geradine Girt, DO  Relative Name and Phone Number:  [name] [phone]    Current Level of Care: Hospital Recommended Level of Care: [place] Prior Approval Number:    Date Approved/Denied:   PASRR Number: [id number]  Discharge Plan: SNF    Current Diagnoses: Patient Active Problem List   Diagnosis Date Noted  . Malnutrition of moderate degree 12/24/2019  . Pressure injury of coccygeal region, unstageable ([place]) 12/21/2019  . Knee pain   . Hypothermia 10/15/2019  . Thrombocytopenia ([place]) 10/15/2019  . AKI (acute kidney injury) ([place]) 10/15/2019  . Mild neurocognitive disorder 06/28/2019  . Major depressive disorder   . Generalized anxiety disorder   . Chronic anticoagulation 12/16/2018  . Abnormal nuclear stress test 12/16/2018  . Debilitated patient 12/16/2018  . Pressure injury of skin 12/13/2018  . UTI (urinary tract infection) 12/13/2018  . Pyelonephritis 11/21/2018  . Sepsis due to Escherichia coli (E. coli) ([place]) 11/28/2017  . E coli bacteremia 11/28/2017  . Sepsis ([place]) 11/24/2017  . Hypokalemia 12/10/2016  . Hypocalcemia 12/10/2016  . Recurrent falls 12/09/2016  . Right ureteral stone 07/17/2014  . Osteomyelitis ([place]) 06/14/2014  . Pernicious anemia 02/20/2014  . Reflux esophagitis 02/02/2014  . Edema 11/23/2013  . Foot pain 03/31/2013  . Endometrial adenocarcinoma ([place]) 02/21/2013  . Trochanteric bursitis of left hip 09/13/2012  . Muscle cramps 08/12/2012  . Fatty liver 01/07/2012  . Mid back pain 09/30/2011  . Weight gain 07/02/2011   . Nonspecific abnormal unspecified cardiovascular function study 04/30/2011  . Hx of papillary thyroid carcinoma 04/07/2011  . Low back pain 04/01/2011  . PAF (paroxysmal atrial fibrillation) 02/05/2011  . Hypoparathyroidism ([place]) 01/07/2011  . GERD (gastroesophageal reflux disease) 05/22/2009  . Leukocytosis 03/24/2009  . Morbid obesity ([place]) 12/20/2008  . Rhinosinusitis, recurrent 09/07/2008  . Vitamin D deficiency 05/10/2007  . Iron deficiency anemia secondary to malabsorption  05/10/2007  . Anemia 05/10/2007  . Hypothyroidism 01/05/2007  . Essential hypertension 01/05/2007  . Asthma 01/05/2007    Orientation RESPIRATION BLADDER Height & Weight     Self, Time, Place  Normal  (Urethreal Catheter) Weight: (!) 248 lb 3.8 oz (112.6 kg) Height:  5\' 4"  (162.6 cm)  BEHAVIORAL SYMPTOMS/MOOD NEUROLOGICAL BOWEL NUTRITION STATUS      Continent Diet (See discharge summary)  AMBULATORY STATUS COMMUNICATION OF NEEDS Skin   Extensive Assist Verbally (Hoarse) Other (Comment), Skin abrasions, PU Stage and Appropriate Care (Pressure injury 12/21/19 Buttocks)                       Personal Care Assistance Level of Assistance  Bathing, Feeding, Dressing   Feeding assistance: Limited assistance (needs assist-Pureed thicken liquids)       Functional Limitations Info  Sight, Hearing, Speech Sight Info: Adequate Hearing Info: Adequate Speech Info: Adequate (Hoarse)    SPECIAL CARE FACTORS FREQUENCY  PT (By licensed PT), OT (By licensed OT)     PT Frequency: 5 X per week OT Frequency: 5x per week            Contractures Contractures Info:  Not present    Additional Factors Info  Code Status, Allergies, Isolation Precautions Code Status Info: DNR Allergies Info: Amoxicillin-pot Clavulanate, Ciprofloxacin, Food, Keflex ,Cephalexin, Penicillins, Rocephin ,Ceftriaxone,, Tomato     Isolation Precautions Info: MRSA     Current Medications (12/25/2019):  This is the current  hospital active medication list Current Facility-Administered Medications  Medication Dose Route Frequency Provider Last Rate Last Admin  . 0.9 %  sodium chloride infusion  250 mL Intravenous Continuous Agarwala, Ravi, MD      . albuterol (PROVENTIL) (2.5 MG/3ML) 0.083% nebulizer solution 2.5 mg  2.5 mg Nebulization Q3H PRN Desai, Rahul P, PA-C      . arformoterol (BROVANA) nebulizer solution 15 mcg  15 mcg Nebulization BID Shearon Stalls, Rahul P, PA-C   15 mcg at 12/25/19 0824  . budesonide (PULMICORT) nebulizer solution 0.5 mg  0.5 mg Nebulization BID Shearon Stalls, Rahul P, PA-C   0.5 mg at 12/25/19 0824  . ceFEPIme (MAXIPIME) 2 g in sodium chloride 0.9 % 100 mL IVPB  2 g Intravenous Q12H Valeda, Corzine, RPH      . Chlorhexidine Gluconate Cloth 2 % PADS 6 each  6 each Topical Daily Kipp Brood, MD   6 each at 12/25/19 1011  . collagenase (SANTYL) ointment   Topical Daily Kipp Brood, MD   Given at 12/25/19 1012  . docusate sodium (COLACE) capsule 100 mg  100 mg Oral BID PRN Shearon Stalls, Rahul P, PA-C      . famotidine (PEPCID) tablet 20 mg  20 mg Oral Daily Vann, Jessica U, DO   20 mg at 12/25/19 1437  . feeding supplement (ENSURE ENLIVE) (ENSURE ENLIVE) liquid 237 mL  237 mL Oral TID BM Audria Nine, DO   237 mL at 12/25/19 1228  . fluconazole (DIFLUCAN) IVPB 100 mg  100 mg Intravenous Q24H Terril, Amaro, RPH 50 mL/hr at 12/25/19 1216 100 mg at 12/25/19 1216  . levothyroxine (SYNTHROID) tablet 200 mcg  200 mcg Oral QAC breakfast Shearon Stalls, Rahul P, PA-C   200 mcg at 12/25/19 0651  . MEDLINE mouth rinse  15 mL Mouth Rinse BID Agarwala, Ravi, MD   15 mL at 12/25/19 1015  . midodrine (PROAMATINE) tablet 5 mg  5 mg Oral TID WC Vann, Jessica U, DO      . morphine 2 MG/ML injection 0.5 mg  0.5 mg Intravenous Q4H PRN Cristal Generous, NP   0.5 mg at 12/24/19 2047  . multivitamin with minerals tablet 1 tablet  1 tablet Oral BID Audria Nine, DO   1 tablet at 12/25/19 0749  . ondansetron (ZOFRAN) injection 4  mg  4 mg Intravenous Q6H PRN Vann, Jessica U, DO      . polyethylene glycol (MIRALAX / GLYCOLAX) packet 17 g  17 g Oral Daily PRN Desai, Rahul P, PA-C      . Resource ThickenUp Clear   Oral PRN Audria Nine, DO      . sodium chloride flush (NS) 0.9 % injection 10-40 mL  10-40 mL Intracatheter Q12H Audria Nine, DO   30 mL at 12/25/19 1012  . sodium chloride flush (NS) 0.9 % injection 10-40 mL  10-40 mL Intracatheter PRN Audria Nine, DO      . white petrolatum (VASELINE) gel   Topical PRN Kipp Brood, MD   0.2 application at 85/63/14 0214     Discharge Medications: Please see discharge summary for a list of discharge medications.  Relevant Imaging Results:  Relevant Lab Results:  Additional Information    Bary Castilla, LCSW

## 2019-12-25 NOTE — Progress Notes (Signed)
Pharmacy Antibiotic Note  [name] is a 64 y.o. female admitted on 12/20/2019 with sepsis and UTI.  Pharmacy has been consulted for Cefepime dosing. -WBC= 8, afebrile, Blood cultures- ngtd Urine with 100k cfu of yeast on recollect  Renal function has continued to increase, will adjust cefepime dosing.   Plan:  Increase Cefepime to 2g IV q12h  Vancomycin stopped Fluconazole 100mg  IV q24 hours x 3 days for uti  Height: 5\' 4"  (162.6 cm) Weight: (!) 112.6 kg (248 lb 3.8 oz) IBW/kg (Calculated) : 54.7  Temp (24hrs), Avg:98 F (36.7 C), Min:97.2 F (36.2 C), Max:98.4 F (36.9 C)  Recent Labs  Lab 12/20/19 1323 12/20/19 1735 12/20/19 1755 12/21/19 0230 12/21/19 1018 12/22/19 0249 12/22/19 0249 12/23/19 0343 12/23/19 0343 12/23/19 0935 12/23/19 1500 12/23/19 2323 12/24/19 0409 12/25/19 0100  WBC 6.5  --    < >   < >  --  7.4   < > 5.3   < > 5.6 5.0 6.3 7.6 8.6  CREATININE 3.36*  --    < >  --  3.01* 2.57*  --  2.35*  --   --   --   --  1.99* 1.76*  LATICACIDVEN 0.7 1.4  --   --   --   --   --   --   --   --   --   --   --   --    < > = values in this interval not displayed.    Estimated Creatinine Clearance: 40.8 mL/min (A) (by C-G formula based on SCr of 1.76 mg/dL (H)).    Allergies  Allergen Reactions  . Other Anaphylaxis    Reaction to tree nuts  . Amoxicillin-Pot Clavulanate Other (See Comments)    headache  . Ciprofloxacin Nausea Only and Rash  . Food Hives and Other (See Comments)    Potato- can eat them but cannot handle them, causes rash  . Keflex [Cephalexin] Rash  . Penicillins Rash    Headache. And hives - Dose not tolerate Cephalosporin either   Has patient had a PCN reaction causing immediate rash, facial/tongue/throat swelling, SOB or lightheadedness with hypotension: No Has patient had a PCN reaction causing severe rash involving mucus membranes or skin necrosis: No Has patient had a PCN reaction that required hospitalization: No Has patient  had a PCN reaction occurring within the last 10 years: Yes (reaction was May 2020) If all of the above answers are "NO", then may proce  . Rocephin [Ceftriaxone] Rash  . Tomato Hives    can eat them but cannot handle them, causes rash    Antimicrobials this admission: Cefepime 7/27>>  Vancomycin 7/28>>7/31  Dose adjustments this admission: N/a     Thank you for allowing pharmacy to be a part of this patient's care.  Erin Hearing PharmD., BCPS Clinical Pharmacist 12/25/2019 11:54 AM

## 2019-12-25 NOTE — Progress Notes (Signed)
     Referral received for [name] :goals of care discussion. Chart reviewed and updates received from RN. Patient's brother, [name] is at the bedside attempting to assist patient with lunch.   Introduced myself and Palliative's role in [name]'s care. Patient and brother verbalized understanding. [name] is requesting to meet for goals of care tomorrow, August 2 @ 11am. He would like his other siblings and wife to be present during [place] meeting to assist with any decisions.   Patient's brother [name] (wife), [name] and his wife [name] will be in attendance for meeting. Discussed visitation policy. It is reasonable to allow family to be present for important goals of care meeting and further decision making.   [place] meeting scheduled for 8/2@ 11. Family is aware a PMT provider will meet at patient's bedside. Family is requesting for meeting to take place outside of patient's room.   Thank you for your referral and allowing PMT to assist in Ms. [name]'s care.   Alda Lea, AGPCNP-BC Palliative Medicine Team  Phone: [phone]  NO CHARGE

## 2019-12-25 NOTE — Progress Notes (Addendum)
Progress Note    [name]  AYT:[id number] DOB: April 28, 1956  DOA: 12/20/2019 PCP: Debbrah Alar, NP    Brief Narrative:     Medical records reviewed and are as summarized below:  [name] is an 64 y.o. female who has a PMH as outlined below.  She presented to [facility] ED 7/27 from rehab due to left sided abdominal discomfort and generalized weakness.  She had recent admission 10/14/19 through 10/22/19 for sepsis 2/2 E.coli UTI.  She was discharged to rehab facility after that admission for ongoing rehab.  Per brother, over the past 2 weeks or so, pt has not been able to stand at all.  She has basically been bedridden.  On 7/27, she wasn't able to state his name and sounded confused.  Brother felt she likely had recurrent UTI's due to her behaving similarly in the past with prior UTI's.  She was also borderline hypotensive.  In ED, UA confirmed UTI.  She also had multiple metabolic derangements as well as anemia with significant coagulopathy (INR > 10).  She was given 10mg  vitamin K and has 1u PRBC ordered.  Besides bloody urine, there has been no obvious bleeding.  Assessment/Plan:   Active Problems:   Sepsis ([place])   Pressure injury of coccygeal region, unstageable ([place])   Malnutrition of moderate degree   Septic Shock in setting of UTI  Numerous E. Coli UTI infections in past with severe urosepsis -IV abx (cefepime) -start to wean midodrine -progressive care for now -culture from 7/31 growing yeast: will discuss with pharmacy  Acute Metabolic Encephalopathy In setting of septic shock  -supportive care -promotes sleep / wake cycle -minimize sedating medications -multiple labs pending such as zinc/copper. Vit A/E/B1  Coagulopathy  Thrombocytopenia  Anemia  -per PCCM: Doubt DIC given high fibrinogen, s/p Vit K, PRBC, hx of low copper  -trend CBC, INR, LFT's  -transfuse for Hgb <7 -monitor for bleeding  -ADAMS13 mildly low-- ?? Sepsis -LDH normal  earlier in hospitalization- recheck -plts tending up, monitor closely- suspect due to sepsis   Hypoglycemia vs hyperglycemia -POC not consistent with lab draws: 69 vs 238 -will monitor closely -will check once finger has been warmed ? Etiology: ? If D5 going into picc and labs drawn from PICC ADDENDUM: after d/c'ding D5 and flushing PICC repeat BMP shows a normal blood sugar so most likely error from being drawn from PICC with D5  AKI Hyperkalemia  Hematuria Non-obstructing renal calculi on CT  -Trend BMP / urinary output -Replace electrolytes as indicated -Avoid nephrotoxic agents, ensure adequate renal perfusion  Hx PAF (on eliquis), dCHF -hold home eliquis with hematuria  -hold home diltiazem, furosemide, spironolactone  due to hypotension/AKI  Hypothyroidism TSH 0.193 7/27 -continue synthroid  Hx COPD -continue bronchodilators   Sacral wound present on admission -wound care per WOC recommendations   Goals of Care -DNR/DNI -brother is [place] -Palliative care consult pending.   Nausea -PRN zofran  Morbid obesity Body mass index is 42.61 kg/m.  Nutrition Status: Nutrition Problem: Moderate Malnutrition Etiology: chronic illness Signs/Symptoms: mild fat depletion, moderate muscle depletion, edema, energy intake < 75% for > or equal to 1 month Interventions: Refer to RD note for recommendations   Pressure Injury 12/13/18 Sacrum Mid Deep Tissue Pressure Injury - Purple or maroon localized area of discolored intact skin or blood-filled blister due to damage of underlying soft tissue from pressure and/or shear. deep tissue injury beginning to evolve (Active)  12/13/18 0500  Location:  Sacrum  Location Orientation: Mid  Staging: Deep Tissue Pressure Injury - Purple or maroon localized area of discolored intact skin or blood-filled blister due to damage of underlying soft tissue from pressure and/or shear.  Wound Description (Comments): deep tissue injury  beginning to evolve into unstageable when assessed on 7/28  Present on Admission: Yes     Pressure Injury 12/21/19 Buttocks Upper;Lateral;Left Stage 2 -  Partial thickness loss of dermis presenting as a shallow open injury with a red, pink wound bed without slough. patchy areas of full thickness skin loss to bilat buttocks related to moisture (Active)  12/21/19 0100  Location: Buttocks  Location Orientation: Upper;Lateral;Left  Staging: Stage 2 -  Partial thickness loss of dermis presenting as a shallow open injury with a red, pink wound bed without slough.  Wound Description (Comments): patchy areas of full thickness skin loss to bilat buttocks related to moisture, NOT pressure  Present on Admission: Yes     Poor overall prognosis   Family Communication/Anticipated D/C date and plan/Code Status   DVT prophylaxis: scd Code Status: DNR Family Communication: brother at bedside Disposition Plan: Status is: Inpatient  Remains inpatient appropriate because:Hemodynamically unstable   Dispo: The patient is from: SNF              Anticipated d/c is to: tbd              Anticipated d/c date is: > 3 days              Patient currently is not medically stable to d/c.         Medical Consultants:    PCCM  Palliative care  Subjective:   Several episodes of nausea and vomiting yesterday and today  Objective:    Vitals:   12/25/19 0500 12/25/19 0700 12/25/19 0800 12/25/19 0826  BP: (!) 111/54 (!) 116/60 (!) 137/70   Pulse: 74  (!) 108   Resp: 13  21   Temp:  98 F (36.7 C)    TempSrc:  Oral    SpO2: 98%  92% 93%  Weight: (!) 112.6 kg     Height:        Intake/Output Summary (Last 24 hours) at 12/25/2019 0834 Last data filed at 12/25/2019 0800 Gross per 24 hour  Intake 1940.1 ml  Output 1285 ml  Net 655.1 ml   Filed Weights   12/23/19 0500 12/24/19 0500 12/25/19 0500  Weight: (!) 108.9 kg (!) 111 kg (!) 112.6 kg    Exam:  General: Appearance:    Severely  obese ill appearing female  Skin: Multiple areas of bruising, large bruise on anterior chest wall  Lungs:     diminished, respirations unlabored  Heart:  Normal HR  GI: +BS, obese, NT  Neurologic:   Awake, alert, able to answer most questions appropriately, appears faigued    Data Reviewed:   I have personally reviewed following labs and imaging studies:  Labs: Labs show the following:   Basic Metabolic Panel: Recent Labs  Lab 12/21/19 0230 12/21/19 0230 12/21/19 1018 12/21/19 1018 12/22/19 0249 12/22/19 0249 12/23/19 0343 12/23/19 0343 12/24/19 0409 12/25/19 0100  NA 138   < > 138  --  141  --  145  --  145 141  K 5.3*   < > 4.9   < > 4.1   < > 3.5   < > 3.5 3.9  CL 105   < > 108  --  110  --  116*  --  117* 113*  CO2 22   < > 19*  --  19*  --  20*  --  20* 20*  GLUCOSE 55*   < > 80  --  314*  --  271*  --  97 238*  BUN 53*   < > 52*  --  43*  --  37*  --  32* 32*  CREATININE 2.98*   < > 3.01*  --  2.57*  --  2.35*  --  1.99* 1.76*  CALCIUM 7.7*   < > 7.5*  --  7.3*  --  7.8*  --  8.2* 8.3*  MG 2.6*  --   --   --  2.0  --   --   --   --   --   PHOS 5.2*  --   --   --  4.0  --   --   --   --   --    < > = values in this interval not displayed.   GFR Estimated Creatinine Clearance: 40.8 mL/min (A) (by C-G formula based on SCr of 1.76 mg/dL (H)). Liver Function Tests: Recent Labs  Lab 12/20/19 1323 12/20/19 1323 12/21/19 1018 12/22/19 0249 12/23/19 0343 12/24/19 0409 12/25/19 0100  AST 113*  --  78*  --  40 33 22  ALT 91*  --  67*  --  40 35 30  ALKPHOS 250*  --  220*  --  162* 159* 142*  BILITOT 1.0  --  1.0  --  0.8 0.5 0.9  PROT 4.8*  --  4.8*  --  4.1* 4.3* 3.8*  ALBUMIN 1.4*   < > 1.7* 1.7* 1.4* 1.5* 1.3*   < > = values in this interval not displayed.   No results for input(s): LIPASE, AMYLASE in the last 168 hours. No results for input(s): AMMONIA in the last 168 hours. Coagulation profile Recent Labs  Lab 12/20/19 1434 12/21/19 0230  12/21/19 1018 12/22/19 0249 12/23/19 1257  INR >10.0* 3.0* 2.3* 1.7* 1.4*    CBC: Recent Labs  Lab 12/20/19 1323 12/20/19 1755 12/22/19 0249 12/23/19 0343 12/23/19 0935 12/23/19 1500 12/23/19 2323 12/24/19 0409 12/25/19 0100  WBC 6.5   < > 7.4   < > 5.6 5.0 6.3 7.6 8.6  NEUTROABS 5.9  --  5.8  --   --   --   --   --   --   HGB 8.0*   < > 7.2*   < > 7.2* 6.7* 8.0* 8.6* 7.9*  HCT 26.3*   < > 23.5*   < > 24.5* 22.7* 25.7* 27.8* 25.6*  MCV 93.3   < > 94.4   < > 92.1 91.5 92.1 92.7 90.5  PLT PLATELET CLUMPS NOTED ON SMEAR, COUNT APPEARS DECREASED   < > 34*   < > 19* 23* 26* 23* 33*   < > = values in this interval not displayed.   Cardiac Enzymes: Recent Labs  Lab 12/21/19 1526  CKTOTAL 51   BNP (last 3 results) No results for input(s): PROBNP in the last 8760 hours. CBG: Recent Labs  Lab 12/24/19 1525 12/24/19 1944 12/24/19 2339 12/25/19 0437 12/25/19 0641  GLUCAP 98 86 71 70 69*   D-Dimer: No results for input(s): DDIMER in the last 72 hours. Hgb A1c: No results for input(s): HGBA1C in the last 72 hours. Lipid Profile: No results for input(s): CHOL, HDL, LDLCALC, TRIG, CHOLHDL, LDLDIRECT in the last 72 hours.  Thyroid function studies: No results for input(s): TSH, T4TOTAL, T3FREE, THYROIDAB in the last 72 hours.  Invalid input(s): FREET3 Anemia work up: Recent Labs    12/23/19 1603  RETICCTPCT 1.3   Sepsis Labs: Recent Labs  Lab 12/20/19 1323 12/20/19 1735 12/20/19 1755 12/23/19 1500 12/23/19 2323 12/24/19 0409 12/25/19 0100  WBC 6.5  --    < > 5.0 6.3 7.6 8.6  LATICACIDVEN 0.7 1.4  --   --   --   --   --    < > = values in this interval not displayed.    Microbiology Recent Results (from the past 240 hour(s))  Urine culture     Status: Abnormal   Collection Time: 12/20/19 12:33 PM   Specimen: Urine, Random  Result Value Ref Range Status   Specimen Description URINE, RANDOM  Final   Special Requests   Final    NONE Performed at [facility], [address], [place], [place] [address]    Culture MULTIPLE SPECIES PRESENT, SUGGEST RECOLLECTION (A)  Final   Report Status 12/21/2019 FINAL  Final  Culture, blood (routine x 2)     Status: None (Preliminary result)   Collection Time: 12/20/19 12:39 PM   Specimen: BLOOD  Result Value Ref Range Status   Specimen Description BLOOD SITE NOT SPECIFIED  Final   Special Requests   Final    BOTTLES DRAWN AEROBIC AND ANAEROBIC Blood Culture results may not be optimal due to an inadequate volume of blood received in culture bottles   Culture   Final    NO GROWTH 4 DAYS Performed at [facility], [place] [address], [place], [place] [address]    Report Status PENDING  Incomplete  Culture, blood (routine x 2)     Status: None (Preliminary result)   Collection Time: 12/20/19  1:24 PM   Specimen: BLOOD  Result Value Ref Range Status   Specimen Description BLOOD SITE NOT SPECIFIED  Final   Special Requests   Final    BOTTLES DRAWN AEROBIC AND ANAEROBIC Blood Culture adequate volume   Culture   Final    NO GROWTH 4 DAYS Performed at [facility], [address], [place],  [address]    Report Status PENDING  Incomplete  SARS Coronavirus 2 by RT PCR (hospital order, performed in [facility] hospital lab) Nasopharyngeal Nasopharyngeal Swab     Status: None   Collection Time: 12/20/19  5:22 PM   Specimen: Nasopharyngeal Swab  Result Value Ref Range Status   SARS Coronavirus 2 NEGATIVE NEGATIVE Final    Comment: (NOTE) SARS-CoV-2 target nucleic acids are NOT DETECTED.  The SARS-CoV-2 RNA is generally detectable in upper and lower respiratory specimens during the acute phase of infection. The lowest concentration of SARS-CoV-2 viral copies this assay can detect is 250 copies / mL. A negative result does not preclude SARS-CoV-2 infection and should not be used as the sole basis for treatment or other patient management decisions.  A negative result may occur  with improper specimen collection / handling, submission of specimen other than nasopharyngeal swab, presence of viral mutation(s) within the areas targeted by this assay, and inadequate number of viral copies (<250 copies / mL). A negative result must be combined with clinical observations, patient history, and epidemiological information.  Fact Sheet for Patients:   StrictlyIdeas.no  Fact Sheet for Healthcare Providers: BankingDealers.co.za  This test is not yet approved or  cleared by the [place] FDA  and has been authorized for detection and/or diagnosis of SARS-CoV-2 by FDA under an Emergency Use Authorization (EUA).  This EUA will remain in effect (meaning this test can be used) for the duration of the COVID-19 declaration under Section 564(b)(1) of the Act, 21 U.S.C. section 360bbb-3(b)(1), unless the authorization is terminated or revoked sooner.  Performed at [facility], [place] [address] [place], [place] [address]   MRSA PCR Screening     Status: Abnormal   Collection Time: 12/21/19  1:31 AM   Specimen: Nasal Mucosa; Nasopharyngeal  Result Value Ref Range Status   MRSA by PCR POSITIVE (A) NEGATIVE Final    Comment:        The GeneXpert MRSA Assay (FDA approved for NASAL specimens only), is one component of a comprehensive MRSA colonization surveillance program. It is not intended to diagnose MRSA infection nor to guide or monitor treatment for MRSA infections. RESULT CALLED TO, READ BACK BY AND VERIFIED WITH: NEWLIN,T RN 12/21/2019 AT 0301 SKEEN,P Performed at [facility], [place] [address], [place], [place] [address]     Procedures and diagnostic studies:  VAS [place] UPPER EXTREMITY VENOUS DUPLEX  Result Date: 12/24/2019 UPPER VENOUS STUDY  Indications: Edema Limitations: Poor ultrasound/tissue interface, bandages and line. Comparison Study: No prior studies. Performing Technologist: Oliver Hum  RVT  Examination Guidelines: A complete evaluation includes B-mode imaging, spectral Doppler, color Doppler, and power Doppler as needed of all accessible portions of each vessel. Bilateral testing is considered an integral part of a complete examination. Limited examinations for reoccurring indications may be performed as noted.  Right Findings: +----------+------------+---------+-----------+----------+-------+ RIGHT     CompressiblePhasicitySpontaneousPropertiesSummary +----------+------------+---------+-----------+----------+-------+ IJV           Full       Yes       Yes                      +----------+------------+---------+-----------+----------+-------+ Subclavian    Full       Yes       Yes                      +----------+------------+---------+-----------+----------+-------+ Axillary      Full       Yes       Yes                      +----------+------------+---------+-----------+----------+-------+ Brachial      Full       Yes       Yes                      +----------+------------+---------+-----------+----------+-------+ Radial        Full                                          +----------+------------+---------+-----------+----------+-------+ Ulnar         Full                                          +----------+------------+---------+-----------+----------+-------+ Cephalic      Full                                          +----------+------------+---------+-----------+----------+-------+  Basilic       Full                                          +----------+------------+---------+-----------+----------+-------+  Left Findings: +----------+------------+---------+-----------+----------+-------+ LEFT      CompressiblePhasicitySpontaneousPropertiesSummary +----------+------------+---------+-----------+----------+-------+ IJV           Full       Yes       Yes                       +----------+------------+---------+-----------+----------+-------+ Subclavian    Full       Yes       Yes                      +----------+------------+---------+-----------+----------+-------+ Axillary      Full       Yes       Yes                      +----------+------------+---------+-----------+----------+-------+ Brachial      Full       Yes       Yes                      +----------+------------+---------+-----------+----------+-------+ Radial        Full                                          +----------+------------+---------+-----------+----------+-------+ Ulnar         Full                                          +----------+------------+---------+-----------+----------+-------+ Cephalic      Full                                          +----------+------------+---------+-----------+----------+-------+ Basilic       Full                                          +----------+------------+---------+-----------+----------+-------+  Summary:  Right: No evidence of deep vein thrombosis in the upper extremity. No evidence of superficial vein thrombosis in the upper extremity.  Left: No evidence of deep vein thrombosis in the upper extremity. No evidence of superficial vein thrombosis in the upper extremity.  *See table(s) above for measurements and observations.  Diagnosing physician: Deitra Mayo MD Electronically signed by Deitra Mayo MD on 12/24/2019 at 4:19:45 AM.    Final     Medications:   . arformoterol  15 mcg Nebulization BID  . budesonide (PULMICORT) nebulizer solution  0.5 mg Nebulization BID  . Chlorhexidine Gluconate Cloth  6 each Topical Daily  . collagenase   Topical Daily  . feeding supplement (ENSURE ENLIVE)  237 mL Oral TID BM  . levothyroxine  200 mcg Oral QAC breakfast  . mouth rinse  15 mL Mouth Rinse BID  . midodrine  10 mg Oral TID WC  . multivitamin  with minerals  1 tablet Oral BID  . sodium chloride flush   10-40 mL Intracatheter Q12H   Continuous Infusions: . sodium chloride    . ceFEPime (MAXIPIME) IV Stopped (12/24/19 1044)  . dextrose 5% lactated ringers 50 mL/hr at 12/25/19 0800     LOS: 5 days   Geradine Girt  [facility]   How to contact the [facility] Attending or Consulting provider [place] or covering provider during after hours [place], for this patient?  1. Check the care team in [facility] and look for a) attending/consulting TRH provider listed and b) the [facility] team listed 2. Log into [email or web address] and use [place] universal password to access. If you do not have the password, please contact the hospital operator. 3. Locate the [facility] provider you are looking for under [facility] and page to a number that you can be directly reached. 4. If you still have difficulty reaching the provider, please page the [facility] (Director on Call) for the Hospitalists listed on amion for assistance.  12/25/2019, 8:34 AM

## 2019-12-25 NOTE — Progress Notes (Signed)
Patient transferred from ICU at 1336hrs.  Oriented to unit and plan of care for shift. Patient verbalized understanding.

## 2019-12-25 NOTE — Progress Notes (Signed)
eLink Physician-Brief Progress Note Patient Name: [name] DOB: [birth year] MRN: [id number]   Date of Service  12/25/2019  HPI/Events of Note  Oliguria - Bladder scan with 230 mL residual. Patient has Foley catheter. Not sure of accuracy of bladder scan in 111 Kg woman with BMI = 42. LVEF = 50-55% and CVP = 4-5.   eICU Interventions  Plan: 1. Bolus with 0.9 NaCl 1 liter IV over 1 hour now.  2. Trend urine output and serial bladder scans.      Intervention Category Major Interventions: Other:  Devonne Kitchen Cornelia Copa 12/25/2019, 1:53 AM

## 2019-12-25 NOTE — Progress Notes (Signed)
Hypoglycemic Event  CBG: 64   Treatment: 1/2 amp D50 IV  Symptoms: none  Follow-up CBG: Time:1745 CBG Result: 91  Possible Reasons for Event: poor PO intake  Comments/MD notified: Dr. El[place]o Squires notified.  Will continue to encourage PO intake and monitor CBG.    Myrtis Hopping

## 2019-12-26 ENCOUNTER — Telehealth: Payer: Self-pay | Admitting: Family

## 2019-12-26 DIAGNOSIS — Z515 Encounter for palliative care: Secondary | ICD-10-CM

## 2019-12-26 DIAGNOSIS — R627 Adult failure to thrive: Secondary | ICD-10-CM

## 2019-12-26 DIAGNOSIS — L89150 Pressure ulcer of sacral region, unstageable: Secondary | ICD-10-CM

## 2019-12-26 DIAGNOSIS — Z7189 Other specified counseling: Secondary | ICD-10-CM

## 2019-12-26 LAB — HEMOGLOBIN A1C
Hgb A1c MFr Bld: 4.3 % — ABNORMAL LOW (ref 4.8–5.6)
Mean Plasma Glucose: 76.71 mg/dL

## 2019-12-26 LAB — COMPREHENSIVE METABOLIC PANEL
ALT: 26 U/L (ref 0–44)
AST: 21 U/L (ref 15–41)
Albumin: 1.1 g/dL — ABNORMAL LOW (ref 3.5–5.0)
Alkaline Phosphatase: 130 U/L — ABNORMAL HIGH (ref 38–126)
Anion gap: 9 (ref 5–15)
BUN: 35 mg/dL — ABNORMAL HIGH (ref 8–23)
CO2: 20 mmol/L — ABNORMAL LOW (ref 22–32)
Calcium: 8.2 mg/dL — ABNORMAL LOW (ref 8.9–10.3)
Chloride: 111 mmol/L (ref 98–111)
Creatinine, Ser: 1.87 mg/dL — ABNORMAL HIGH (ref 0.44–1.00)
GFR calc Af Amer: 33 mL/min — ABNORMAL LOW (ref >60)
GFR calc non Af Amer: 28 mL/min — ABNORMAL LOW (ref >60)
Glucose, Bld: 235 mg/dL — ABNORMAL HIGH (ref 70–99)
Potassium: 3.6 mmol/L (ref 3.5–5.1)
Sodium: 140 mmol/L (ref 135–145)
Total Bilirubin: 0.8 mg/dL (ref 0.3–1.2)
Total Protein: 3.8 g/dL — ABNORMAL LOW (ref 6.5–8.1)

## 2019-12-26 LAB — GLUCOSE, CAPILLARY
Glucose-Capillary: 73 mg/dL (ref 70–99)
Glucose-Capillary: 66 mg/dL — ABNORMAL LOW (ref 70–99)
Glucose-Capillary: 114 mg/dL — ABNORMAL HIGH (ref 70–99)
Glucose-Capillary: 75 mg/dL (ref 70–99)
Glucose-Capillary: 75 mg/dL (ref 70–99)
Glucose-Capillary: 95 mg/dL (ref 70–99)
Glucose-Capillary: 87 mg/dL (ref 70–99)

## 2019-12-26 LAB — CBC
HCT: 26 % — ABNORMAL LOW (ref 36.0–46.0)
Hemoglobin: 7.7 g/dL — ABNORMAL LOW (ref 12.0–15.0)
MCH: 27.7 pg (ref 26.0–34.0)
MCHC: 29.6 g/dL — ABNORMAL LOW (ref 30.0–36.0)
MCV: 93.5 fL (ref 80.0–100.0)
Platelets: 42 10*3/uL — ABNORMAL LOW (ref 150–400)
RBC: 2.78 MIL/uL — ABNORMAL LOW (ref 3.87–5.11)
RDW: 23.7 % — ABNORMAL HIGH (ref 11.5–15.5)
WBC: 6.7 10*3/uL (ref 4.0–10.5)
nRBC: 0 % (ref 0.0–0.2)

## 2019-12-26 LAB — LACTATE DEHYDROGENASE: LDH: 144 U/L (ref 98–192)

## 2019-12-26 LAB — MISC LABCORP TEST (SEND OUT): Labcorp test code: 716910

## 2019-12-26 LAB — VITAMIN B1: Vitamin B1 (Thiamine): 70.9 nmol/L (ref 66.5–200.0)

## 2019-12-26 MED ORDER — BIOTENE DRY MOUTH MT LIQD: 15.0000 mL | OROMUCOSAL | Status: DC | PRN

## 2019-12-26 MED ORDER — LORAZEPAM 2 MG/ML IJ SOLN: 1.0000 mg | INTRAMUSCULAR | Status: DC | PRN

## 2019-12-26 MED ORDER — HYDROMORPHONE HCL 1 MG/ML IJ SOLN
0.5000 mg | INTRAMUSCULAR | Status: DC | PRN
  Administered 2019-12-26 – 2019-12-27 (×3): 0.5 mg via INTRAVENOUS
  Filled 2019-12-26 (×3): qty 1

## 2019-12-26 MED ORDER — LORAZEPAM 1 MG PO TABS: 1.0000 mg | ORAL_TABLET | ORAL | Status: DC | PRN

## 2019-12-26 MED ORDER — LORAZEPAM 2 MG/ML PO CONC: 1.0000 mg | ORAL | Status: DC | PRN

## 2019-12-26 MED ORDER — POLYVINYL ALCOHOL 1.4 % OP SOLN
1.0000 [drp] | Freq: Four times a day (QID) | OPHTHALMIC | Status: DC | PRN
  Filled 2019-12-26: qty 15

## 2019-12-26 MED ORDER — ONDANSETRON HCL 4 MG/2ML IJ SOLN
4.0000 mg | Freq: Four times a day (QID) | INTRAMUSCULAR | Status: DC | PRN
  Administered 2019-12-26 – 2019-12-27 (×2): 4 mg via INTRAVENOUS
  Filled 2019-12-26 (×2): qty 2

## 2019-12-26 MED ORDER — GLYCOPYRROLATE 1 MG PO TABS
1.0000 mg | ORAL_TABLET | ORAL | Status: DC | PRN
  Filled 2019-12-26: qty 1

## 2019-12-26 MED ORDER — GLYCOPYRROLATE 0.2 MG/ML IJ SOLN: 0.2000 mg | INTRAMUSCULAR | Status: DC | PRN

## 2019-12-26 MED ORDER — DEXTROSE 50 % IV SOLN
1.0000 | Freq: Once | INTRAVENOUS | Status: AC
  Administered 2019-12-26: 50 mL via INTRAVENOUS
  Filled 2019-12-26: qty 50

## 2019-12-26 MED ORDER — ACETAMINOPHEN 650 MG RE SUPP: 650.0000 mg | Freq: Four times a day (QID) | RECTAL | Status: DC | PRN

## 2019-12-26 MED ORDER — ONDANSETRON 4 MG PO TBDP
4.0000 mg | ORAL_TABLET | Freq: Four times a day (QID) | ORAL | Status: DC | PRN
  Filled 2019-12-26: qty 1

## 2019-12-26 MED ORDER — PROMETHAZINE HCL 25 MG/ML IJ SOLN: 12.5000 mg | Freq: Four times a day (QID) | INTRAMUSCULAR | Status: DC | PRN

## 2019-12-26 MED ORDER — ACETAMINOPHEN 325 MG PO TABS: 650.0000 mg | ORAL_TABLET | Freq: Four times a day (QID) | ORAL | Status: DC | PRN

## 2019-12-26 NOTE — Progress Notes (Signed)
Manufacturing engineer [facility]) Hospital Liaison Note:  Received request from [place] [employer] for family interest in [facility] with request for transfer  Chart reviewed. An [facility] HLT member met with patient and family to confirm interest and explain services.   Family agreeable to transfer tomorrow (12/27/19) once [employer] is done  - [place] will meet with family at 8:30am tomorrow morning to complete paperwork.  Will update [facility] TOC in the morning.    Thank you for referral,    Gar Ponto, RN [facility] Liaison  [place] are on AMION

## 2019-12-26 NOTE — TOC Progression Note (Addendum)
Transition of Care [facility]) - Progression Note    Patient Details  Name: [name] MRN: [id number] Date of Birth: [birth year]  Transition of Care [facility]) CM/SW [place], LCSW Phone Number: 12/26/2019, 10:39 AM  Clinical Narrative:    CSW still awaiting SNF bed offers. Placement barrier will be that patient has already used about 60 of her SNF days. CSW will follow up to see if she has applied for Medicaid.     Expected Discharge Plan: Skilled Nursing Facility Barriers to Discharge: Continued Medical Work up  Expected Discharge Plan and Services Expected Discharge Plan: [place]                                               Social Determinants of Health (SDOH) Interventions    Readmission Risk Interventions Readmission Risk Prevention Plan 12/13/2018  Transportation Screening Complete  PCP or Specialist Appt within 3-5 Days Complete  HRI or [place] Complete  Social Work Consult for [place] Planning/Counseling Complete  Palliative Care Screening Not Applicable  Medication Review Press photographer) Referral to Pharmacy  Some recent data might be hidden

## 2019-12-26 NOTE — Progress Notes (Signed)
Progress Note    [name]  POE:[id number] DOB: 05-Nov-1955  DOA: 12/20/2019 PCP: Debbrah Alar, NP    Brief Narrative:     Medical records reviewed and are as summarized below:  [name] is an 64 y.o. female who has a PMH as outlined below.  She presented to [facility] ED 7/27 from rehab due to left sided abdominal discomfort and generalized weakness.  She had recent admission 10/14/19 through 10/22/19 for sepsis 2/2 E.coli UTI.  She was discharged to rehab facility after that admission for ongoing rehab.  Per brother, over the past 2 weeks or so, pt has not been able to stand at all.  She has basically been bedridden.  On 7/27, she wasn't able to state his name and sounded confused.  Brother felt she likely had recurrent UTI's due to her behaving similarly in the past with prior UTI's.  She was also borderline hypotensive.  In ED, UA confirmed UTI.  She also had multiple metabolic derangements as well as anemia with significant coagulopathy (INR > 10).  She was given 10mg  vitamin K and has 1u PRBC ordered.  Besides bloody urine, there has been no obvious bleeding.  Assessment/Plan:   Active Problems:   Sepsis ([place])   Pressure injury of coccygeal region, unstageable ([place])   Malnutrition of moderate degree   Septic Shock in setting of UTI  Numerous E. Coli UTI infections in past with severe urosepsis -IV abx (cefepime) -start to wean midodrine as tolerated -progressive care for now -culture from 7/31 growing yeast: started coverage 8/1  Acute Metabolic Encephalopathy In setting of septic shock  -supportive care -promotes sleep / wake cycle -minimize sedating medications -multiple labs pending: Vit A/E/B1  Coagulopathy  Thrombocytopenia  Anemia  -per PCCM: Doubt DIC given high fibrinogen, s/p Vit K, PRBC, hx of low copper  -trend CBC, INR, LFT's  -transfuse for Hgb <7 -monitor for bleeding  -ADAMS13 mildly low-- ?? Sepsis -LDH normal  -plts tending up,  monitor closely- suspect low due to sepsis   Hypoglycemia vs hyperglycemia -POC not consistent with lab draws: 69 vs 238 -will monitor closely -will check once finger has been warmed  Etiology: D5 going into picc and labs drawn from PICC-- FINGER STICKS LIKELY TO BE MOST ACCURATE  AKI Hyperkalemia  Hematuria Non-obstructing renal calculi on CT  -Trend BMP / urinary output -Replace electrolytes as indicated -Avoid nephrotoxic agents, ensure adequate renal perfusion  Hx PAF (on eliquis), dCHF -hold home eliquis with hematuria  -hold home diltiazem, furosemide, spironolactone  due to hypotension/AKI  Hypothyroidism TSH 0.193 7/27 -continue synthroid  Hx COPD -continue bronchodilators   Sacral wound present on admission -wound care per WOC recommendations   Goals of Care -DNR/DNI -brother is [place] -Palliative care consult appreciated today.   Nausea -PRN zofran  Morbid obesity Body mass index is 42.61 kg/m.  Nutrition Status: Nutrition Problem: Moderate Malnutrition Etiology: chronic illness Signs/Symptoms: mild fat depletion, moderate muscle depletion, edema, energy intake < 75% for > or equal to 1 month Interventions: Refer to RD note for recommendations   Pressure Injury 12/13/18 Sacrum Mid Deep Tissue Pressure Injury - Purple or maroon localized area of discolored intact skin or blood-filled blister due to damage of underlying soft tissue from pressure and/or shear. deep tissue injury beginning to evolve (Active)  12/13/18 0500  Location: Sacrum  Location Orientation: Mid  Staging: Deep Tissue Pressure Injury - Purple or maroon localized area of discolored intact skin or blood-filled  blister due to damage of underlying soft tissue from pressure and/or shear.  Wound Description (Comments): deep tissue injury beginning to evolve into unstageable when assessed on 7/28  Present on Admission: Yes     Pressure Injury 12/21/19 Buttocks Upper;Lateral;Left  Stage 2 -  Partial thickness loss of dermis presenting as a shallow open injury with a red, pink wound bed without slough. patchy areas of full thickness skin loss to bilat buttocks related to moisture (Active)  12/21/19 0100  Location: Buttocks  Location Orientation: Upper;Lateral;Left  Staging: Stage 2 -  Partial thickness loss of dermis presenting as a shallow open injury with a red, pink wound bed without slough.  Wound Description (Comments): patchy areas of full thickness skin loss to bilat buttocks related to moisture, NOT pressure  Present on Admission: Yes     Poor overall prognosis-- continue to have hypoglycemia, continues to have poor PO intake   Family Communication/Anticipated D/C date and plan/Code Status   DVT prophylaxis: scd Code Status: DNR Family Communication: brother/sister in law at bedside Disposition Plan: Status is: Inpatient  Remains inpatient appropriate because:Hemodynamically unstable   Dispo: The patient is from: SNF              Anticipated d/c is to: tbd- hospice?              Anticipated d/c date is: > 3 days              Patient currently is not medically stable to d/c.         Medical Consultants:    PCCM  Palliative care  Subjective:   No further nausea but feels too tired to eat  Objective:    Vitals:   12/25/19 2031 12/25/19 2032 12/26/19 0020 12/26/19 0815  BP:   (!) 103/50 (!) 113/55  Pulse:   76   Resp:   17 16  Temp:   97.9 F (36.6 C) 97.8 F (36.6 C)  TempSrc:   Oral Oral  SpO2: 100% 100% 97% 97%  Weight:      Height:        Intake/Output Summary (Last 24 hours) at 12/26/2019 1115 Last data filed at 12/26/2019 0900 Gross per 24 hour  Intake 541.69 ml  Output 880 ml  Net -338.31 ml   Filed Weights   12/23/19 0500 12/24/19 0500 12/25/19 0500  Weight: (!) 108.9 kg (!) 111 kg (!) 112.6 kg    Exam:   General: Appearance:    Severely obese female in no acute distress  Eyes:    PERRL, conjunctiva/corneas  clear, EOM's intact       Lungs:     Clear to auscultation bilaterally, respirations unlabored  Heart:    Normal heart rate. No murmurs, rubs, or gallops.   MS:   toe amputation noted. Wearing boots   Neurologic:   Awake, alert, oriented x 3. No apparent focal neurological           defect.      Data Reviewed:   I have personally reviewed following labs and imaging studies:  Labs: Labs show the following:   Basic Metabolic Panel: Recent Labs  Lab 12/21/19 0230 12/21/19 1018 12/22/19 0249 12/22/19 0249 12/23/19 0343 12/23/19 0343 12/24/19 0409 12/24/19 0409 12/25/19 0100 12/25/19 0100 12/25/19 1200 12/26/19 0455  NA 138   < > 141   < > 145  --  145  --  141  --  139 140  K 5.3*   < >  4.1   < > 3.5   < > 3.5   < > 3.9   < > 4.0 3.6  CL 105   < > 110   < > 116*  --  117*  --  113*  --  111 111  CO2 22   < > 19*   < > 20*  --  20*  --  20*  --  19* 20*  GLUCOSE 55*   < > 314*   < > 271*  --  97  --  238*  --  92 235*  BUN 53*   < > 43*   < > 37*  --  32*  --  32*  --  34* 35*  CREATININE 2.98*   < > 2.57*   < > 2.35*  --  1.99*  --  1.76*  --  1.84* 1.87*  CALCIUM 7.7*   < > 7.3*   < > 7.8*  --  8.2*  --  8.3*  --  8.4* 8.2*  MG 2.6*  --  2.0  --   --   --   --   --   --   --   --   --   PHOS 5.2*  --  4.0  --   --   --   --   --   --   --   --   --    < > = values in this interval not displayed.   GFR Estimated Creatinine Clearance: 38.4 mL/min (A) (by C-G formula based on SCr of 1.87 mg/dL (H)). Liver Function Tests: Recent Labs  Lab 12/21/19 1018 12/21/19 1018 12/22/19 0249 12/23/19 0343 12/24/19 0409 12/25/19 0100 12/26/19 0455  AST 78*  --   --  40 33 22 21  ALT 67*  --   --  40 35 30 26  ALKPHOS 220*  --   --  162* 159* 142* 130*  BILITOT 1.0  --   --  0.8 0.5 0.9 0.8  PROT 4.8*  --   --  4.1* 4.3* 3.8* 3.8*  ALBUMIN 1.7*   < > 1.7* 1.4* 1.5* 1.3* 1.1*   < > = values in this interval not displayed.   No results for input(s): LIPASE, AMYLASE in the last  168 hours. No results for input(s): AMMONIA in the last 168 hours. Coagulation profile Recent Labs  Lab 12/20/19 1434 12/21/19 0230 12/21/19 1018 12/22/19 0249 12/23/19 1257  INR >10.0* 3.0* 2.3* 1.7* 1.4*    CBC: Recent Labs  Lab 12/20/19 1323 12/20/19 1755 12/22/19 0249 12/23/19 0343 12/23/19 1500 12/23/19 2323 12/24/19 0409 12/25/19 0100 12/26/19 0455  WBC 6.5   < > 7.4   < > 5.0 6.3 7.6 8.6 6.7  NEUTROABS 5.9  --  5.8  --   --   --   --   --   --   HGB 8.0*   < > 7.2*   < > 6.7* 8.0* 8.6* 7.9* 7.7*  HCT 26.3*   < > 23.5*   < > 22.7* 25.7* 27.8* 25.6* 26.0*  MCV 93.3   < > 94.4   < > 91.5 92.1 92.7 90.5 93.5  PLT PLATELET CLUMPS NOTED ON SMEAR, COUNT APPEARS DECREASED   < > 34*   < > 23* 26* 23* 33* 42*   < > = values in this interval not displayed.   Cardiac Enzymes: Recent Labs  Lab 12/21/19 1526  CKTOTAL 51  BNP (last 3 results) No results for input(s): PROBNP in the last 8760 hours. CBG: Recent Labs  Lab 12/26/19 0408 12/26/19 0500 12/26/19 0610 12/26/19 0808 12/26/19 1026  GLUCAP 66* 114* 75 75 95   D-Dimer: No results for input(s): DDIMER in the last 72 hours. Hgb A1c: Recent Labs    12/26/19 0455  HGBA1C 4.3*   Lipid Profile: No results for input(s): CHOL, HDL, LDLCALC, TRIG, CHOLHDL, LDLDIRECT in the last 72 hours. Thyroid function studies: No results for input(s): TSH, T4TOTAL, T3FREE, THYROIDAB in the last 72 hours.  Invalid input(s): FREET3 Anemia work up: Recent Labs    12/23/19 1603  RETICCTPCT 1.3   Sepsis Labs: Recent Labs  Lab 12/20/19 1323 12/20/19 1735 12/20/19 1755 12/23/19 2323 12/24/19 0409 12/25/19 0100 12/26/19 0455  WBC 6.5  --    < > 6.3 7.6 8.6 6.7  LATICACIDVEN 0.7 1.4  --   --   --   --   --    < > = values in this interval not displayed.    Microbiology Recent Results (from the past 240 hour(s))  Urine culture     Status: Abnormal   Collection Time: 12/20/19 12:33 PM   Specimen: Urine, Random    Result Value Ref Range Status   Specimen Description URINE, RANDOM  Final   Special Requests   Final    NONE Performed at [facility], [address], [place], [place] [address]    Culture MULTIPLE SPECIES PRESENT, SUGGEST RECOLLECTION (A)  Final   Report Status 12/21/2019 FINAL  Final  Culture, blood (routine x 2)     Status: None   Collection Time: 12/20/19 12:39 PM   Specimen: BLOOD  Result Value Ref Range Status   Specimen Description BLOOD SITE NOT SPECIFIED  Final   Special Requests   Final    BOTTLES DRAWN AEROBIC AND ANAEROBIC Blood Culture results may not be optimal due to an inadequate volume of blood received in culture bottles   Culture   Final    NO GROWTH 5 DAYS Performed at [facility], [place] [address] [place], [place] [address]    Report Status 12/25/2019 FINAL  Final  Culture, blood (routine x 2)     Status: None   Collection Time: 12/20/19  1:24 PM   Specimen: BLOOD  Result Value Ref Range Status   Specimen Description BLOOD SITE NOT SPECIFIED  Final   Special Requests   Final    BOTTLES DRAWN AEROBIC AND ANAEROBIC Blood Culture adequate volume   Culture   Final    NO GROWTH 5 DAYS Performed at [facility], [place] [address], [place],  [address]    Report Status 12/25/2019 FINAL  Final  SARS Coronavirus 2 by RT PCR (hospital order, performed in [facility] hospital lab) Nasopharyngeal Nasopharyngeal Swab     Status: None   Collection Time: 12/20/19  5:22 PM   Specimen: Nasopharyngeal Swab  Result Value Ref Range Status   SARS Coronavirus 2 NEGATIVE NEGATIVE Final    Comment: (NOTE) SARS-CoV-2 target nucleic acids are NOT DETECTED.  The SARS-CoV-2 RNA is generally detectable in upper and lower respiratory specimens during the acute phase of infection. The lowest concentration of SARS-CoV-2 viral copies this assay can detect is 250 copies / mL. A negative result does not preclude SARS-CoV-2 infection and should not be used as the  sole basis for treatment or other patient management decisions.  A negative result may  occur with improper specimen collection / handling, submission of specimen other than nasopharyngeal swab, presence of viral mutation(s) within the areas targeted by this assay, and inadequate number of viral copies (<250 copies / mL). A negative result must be combined with clinical observations, patient history, and epidemiological information.  Fact Sheet for Patients:   StrictlyIdeas.no  Fact Sheet for Healthcare Providers: BankingDealers.co.za  This test is not yet approved or  cleared by the [place] FDA and has been authorized for detection and/or diagnosis of SARS-CoV-2 by FDA under an Emergency Use Authorization (EUA).  This EUA will remain in effect (meaning this test can be used) for the duration of the COVID-19 declaration under Section 564(b)(1) of the Act, 21 U.S.C. section 360bbb-3(b)(1), unless the authorization is terminated or revoked sooner.  Performed at [facility], [place] [address], [place], [place] [address]   MRSA PCR Screening     Status: Abnormal   Collection Time: 12/21/19  1:31 AM   Specimen: Nasal Mucosa; Nasopharyngeal  Result Value Ref Range Status   MRSA by PCR POSITIVE (A) NEGATIVE Final    Comment:        The GeneXpert MRSA Assay (FDA approved for NASAL specimens only), is one component of a comprehensive MRSA colonization surveillance program. It is not intended to diagnose MRSA infection nor to guide or monitor treatment for MRSA infections. RESULT CALLED TO, READ BACK BY AND VERIFIED WITH: NEWLIN,T RN 12/21/2019 AT 0301 SKEEN,P Performed at [facility], [place] [address], [place], [place] [address]   Urine Culture     Status: Abnormal   Collection Time: 12/24/19  4:09 AM   Specimen: Urine, Random  Result Value Ref Range Status   Specimen Description URINE, RANDOM  Final   Special  Requests   Final    NONE Performed at [facility], [place] [address], [place], [place] [address]    Culture >=100,000 COLONIES/mL YEAST (A)  Final   Report Status 12/25/2019 FINAL  Final    Procedures and diagnostic studies:  No results found.  Medications:   . arformoterol  15 mcg Nebulization BID  . budesonide (PULMICORT) nebulizer solution  0.5 mg Nebulization BID  . Chlorhexidine Gluconate Cloth  6 each Topical Daily  . collagenase   Topical Daily  . famotidine  20 mg Oral Daily  . feeding supplement (ENSURE ENLIVE)  237 mL Oral TID BM  . levothyroxine  200 mcg Oral QAC breakfast  . mouth rinse  15 mL Mouth Rinse BID  . midodrine  5 mg Oral TID WC  . multivitamin with minerals  1 tablet Oral BID  . sodium chloride flush  10-40 mL Intracatheter Q12H   Continuous Infusions: . ceFEPime (MAXIPIME) IV 2 g (12/26/19 0828)  . dextrose 5 % and 0.45% NaCl 50 mL/hr at 12/25/19 1840  . fluconazole (DIFLUCAN) IV 100 mg (12/25/19 1216)     LOS: 6 days   Geradine Girt  [facility]   How to contact the [facility] Attending or Consulting provider [place] or covering provider during after hours [place], for this patient?  1. Check the care team in [facility] and look for a) attending/consulting TRH provider listed and b) the [facility] team listed 2. Log into [email or web address] and use [place]'s universal password to access. If you do not have the password, please contact the hospital operator. 3. Locate the [facility] provider you are looking for under [facility] and page to a number  that you can be directly reached. 4. If you still have difficulty reaching the provider, please page the [facility] (Director on Call) for the Hospitalists listed on amion for assistance.  12/26/2019, 11:15 AM

## 2019-12-26 NOTE — Telephone Encounter (Signed)
Caller :Latasha  Call Back # [phone]  Per [name], he office has received a referral and would like to know if [name] will be the attending Provider while under their care.  Please Advise

## 2019-12-26 NOTE — Progress Notes (Signed)
Hypoglycemic Event  CBG: 66      Treatment: D50 50 mL (25 gm) @ 0440  Symptoms: None  Follow-up CBG: Time:0500  CBG Result:114  Possible Reasons for Event: Inadequate meal intake and Unknown  Comments/MD notified:Vasundhra Marlowe Sax, MD    Georga Kaufmann

## 2019-12-26 NOTE — TOC Progression Note (Signed)
Transition of Care [facility]) - Progression Note    Patient Details  Name: [name] MRN: [id number] Date of Birth: Nov 16, 1955  Transition of Care [facility]) CM/SW Contact  Loletha Grayer Beverely Pace, RN Phone Number: [phone] (working remotely) 12/26/2019, 12:39 PM  Clinical Narrative:   Case manager spoke with patient's brother- [name] via telephone, along with [place]. Choice for residential hospice was offered, He wants patient to go to [facility] ([facility] Case manager called referral to Gar Ponto, [place] Liaison. She states that Latanya Presser will come to hospital and speak with patient's family today. TOC team will continue to monitor.    Expected Discharge Plan: [place] Barriers to Discharge: Continued Medical Work up  Expected Discharge Plan and Services Expected Discharge Plan: [place] In-house Referral: Hospice / Palliative Care   Post Acute Care Choice: Hospice                                         Social Determinants of Health (SDOH) Interventions    Readmission Risk Interventions Readmission Risk Prevention Plan 12/13/2018  Transportation Screening Complete  PCP or Specialist Appt within 3-5 Days Complete  HRI or [place] Complete  Social Work Consult for [place] Planning/Counseling Complete  Palliative Care Screening Not Applicable  Medication Review Press photographer) Referral to Pharmacy  Some recent data might be hidden

## 2019-12-26 NOTE — TOC Progression Note (Signed)
Transition of Care [facility]) - Progression Note    Patient Details  Name: [name] MRN: [id number] Date of Birth: [birth year]  Transition of Care [facility]) CM/SW [place], LCSW Phone Number: 12/26/2019, 1:55 PM  Clinical Narrative:    CSW received a call from [facility] [place]. They are able to accept Patient tomorrow.   Expected Discharge Plan: [place] Barriers to Discharge: Continued Medical Work up  Expected Discharge Plan and Services Expected Discharge Plan: [place] In-house Referral: Hospice / Palliative Care   Post Acute Care Choice: Hospice                                         Social Determinants of Health (SDOH) Interventions    Readmission Risk Interventions Readmission Risk Prevention Plan 12/13/2018  Transportation Screening Complete  PCP or Specialist Appt within 3-5 Days Complete  HRI or [place] Complete  Social Work Consult for [place] Planning/Counseling Complete  Palliative Care Screening Not Applicable  Medication Review Press photographer) Referral to Pharmacy  Some recent data might be hidden

## 2019-12-26 NOTE — Consult Note (Signed)
Consultation Note Date: 12/26/2019   Patient Name: [name]  DOB: Jan 26, 1956  MRN: [id number]  Age / Sex: 64 y.o., female  PCP: Debbrah Alar, NP Referring Physician: Geradine Girt, DO  Reason for Consultation: Establishing goals of care  HPI/Patient Profile: 64 y.o. female  with past medical history significant for hypothyroidism, iron deficiency anemia secondary to malabsorption, asthma, GERD, paroxysmal atrial fibrillation, HTN, and morbid obesity. She presented to [facility] Emergency Department from SNF rehab on 12/20/2019 with left sided abdominal discomfort and generalized weakness. Patient was recently hospitalized 10/14/19 to 10/22/19 for sepsis secondary to UTI and was discharged to [facility] for rehab. Per brother, had not been able to stand up for the past 2 weeks. On 7/27, she sounded confused and brother felt she likely had recurrent UTI due to similar behavior in the past with previous UTI. In the ED, patient had persistent hypotension, hypothermia, and evidence of coagulopathy. Urinalysis was consistent with UTI. Blood pressure remained low despite aggressive fluids resuscitation. Patient was admitted to [facility] service for management of septic shock secondary to UTI. She required vasopressor for BP support 7/28-7/30.  Palliative care has been consulted to assist with goals of care.   Clinical Assessment and Goals of Care:   Family Discussion: 12/26/19   Participants: [name] (son/HCPOA), Juliann Pulse (daughter in law), Doren Custard (son), Arby Barrette (daughter in law)   I have reviewed medical records including EPIC notes, labs and imaging, examined the patient. I met with patient's family in the [facility] consultation room to discuss diagnosis, prognosis, GOC, EOL wishes, disposition, and options.  I introduced Palliative Medicine as specialized medical care for people living with serious illness. It focuses on providing  relief from the symptoms and stress of a serious illness.   We discussed a brief life review of the patient. She was born in [place] and grew up in [place]. 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 She has always maintained a close relationship with both brothers. She did not marry or have children. She relocated back to [facility] in 2006 to follow and remain close to her brother's family.  As far as functional status, family describes a rapid decline in the past weeks and months. She has required a wheelchair for the past 6 months, and has been bed-bound for the past few weeks. Family shares she has fallen several times at home prior to being hospitalized in May. Family also describes gradual but progressive cognitive decline over the past 3-4 years. They share that she was "very good at hiding it because she is so intelligent".   We discussed her current illness and what it means in the larger context of her ongoing co-morbidities. Discussed that she is showing signs of failure to thrive, exacerbated by recurrent episodes of major illness (septic shock, UTI).    The difference between aggressive medical intervention and comfort care was considered in light of the patient's goals of care. The family collectively feels that she does  not have good quality of life in her current state, and they recognize this state will only decline over time. Family expresses the patient "would not want to live like this".    Advanced directives, concepts specific to code status, artifical feeding and hydration, and rehospitalization were considered and discussed. Hospice Care services were explained and offered. Family is very receptive to hospice and would like to move forward with a referral.   Discussed transitioning to comfort care while in the hospital, and what that would look like--keeping her clean and dry, no labs, no artificial hydration or feeding,  minimizing of medications, comfort feeds, medication for pain and dyspnea as needed.   Questions and concerns were addressed.  The family was encouraged to call with questions or concerns.   Primary decision maker: [name] (son and HCPOA) with support from brother Doren Custard (and daughters in Sports coach)    SUMMARY OF RECOMMENDATIONS   - DNR/DNI (already in place) - transition to comfort care  - continue foley catheter for comfort - TOC order placed for residential hospice referral  Symptom Management:   Per end of life order set  Hydromorphone (DILAUDID) prn for pain or dyspnea  Lorazepam (ATIVAN) prn for anxiety  Glycopyrrolate (ROBINUL) for excessive secretions  Ondansetron (ZOFRAN) prn for nausea   Palliative Prophylaxis:   Aspiration, Frequent Pain Assessment, Palliative Wound Care and Turn Reposition  Additional Recommendations (Limitations, Scope, Preferences):  Full Comfort Care  Prognosis:   Weeks with minimal po intake  Discharge Planning: Hospice facility      Primary Diagnoses: Present on Admission: . Sepsis ([place]) . Pressure injury of coccygeal region, unstageable ([place])   I have reviewed the medical record, interviewed the patient and family, and examined the patient. The following aspects are pertinent.  Past Medical History:  Diagnosis Date  . Abnormal Pap smear    years ago/no biopsy  . Allergic rhinitis   . Aortic atherosclerosis   . Arthritis    back- lower  . Asthma   . Atrial fibrillation 12/2010   OFF XARELTO LAST MONTH DUE TO BLEEDING IN STOOL  . Bursitis of hip left  . Chronic anticoagulation 12/16/2018   CHADS VASC=2 for sex and H/O HTN- she is on Eliquis  . Chronic diastolic (congestive) heart failure    pt unaware of this  . COPD (chronic obstructive pulmonary disease) with chronic bronchitis   . Decreased pulses in feet   . Dysrhythmia    afib, followed by Dr. Stanford Breed   . Edema of both legs   . Endometrial adenocarcinoma 02/21/2013    S/p D and C, has mirena, being followed by GYN   . Esophagitis 02/02/2014   Distal, linear erosions, noted on endoscopy  . Essential hypertension 01/05/2007   Echo Nov June 2020- EF 50-55%, mild LVH, normal LA   . Fatty liver 01/07/2012  . Fibroid 1974   fibroid cyst on left fallopian tube  . Fibromyalgia   . Foot ulcer    AREA HEALED RIGHT FOOT  . Generalized anxiety disorder   . GERD (gastroesophageal reflux disease)   . Headache    occasional sinus headache   . Hepatomegaly   . History of blood transfusion JULY 2015  . History of cardiomegaly 12/06/2010   Noted on CT  . History of E. coli septicemia   . History of papillary thyroid carcinoma 04/07/2011  . Hypocalcemia 12/10/2016  . Hypokalemia 12/10/2016  . Hypoparathyroidism 01/07/2011  . Hypothyroidism   . Iron deficiency anemia secondary to  malabsorption  05/10/2007   Qualifier: Diagnosis of  By: Wynona Luna    . Kidney stone 2015/09/12   passed on their own  . Leukocytosis 03/24/2009   cta cheQualifier: Diagnosis of  By: Wynona Luna    . Major depressive disorder   . Mild neurocognitive disorder 06/28/2019  . Morbid obesity   . MRSA infection   . Nephrolithiasis 2/16, 9/16   SEES DR Risa Grill  . Osteomyelitis 06/14/2014  . PAF (paroxysmal atrial fibrillation) 02/05/2011   Documented 2012- NSR since.  On chronic anticoagulation.  . Pernicious anemia 02/20/2014   followed by Debbrah Alar  . Pneumonia   . PONV (postoperative nausea and vomiting)   . Pyelonephritis 11/24/2018  . Rash    thighs and back  . Seizures    infancy secondary to fever  . Staph aureus infection   . Thoracic spondylosis 12/06/2010   Noted on CT  . Uterine cancer 2014   Mirena IUD  . UTI (urinary tract infection) 12/13/2018  . Vitamin B 12 deficiency   . Vitamin D deficiency 05/10/2007   Qualifier: Diagnosis of  By: Wynona Luna     Family History  Problem Relation Age of Onset  . Hypertension Father   . Diabetes Father     . Lung cancer Father   . Heart attack Father        MI at age 69  . Hypertension Mother   . Hyperthyroidism Mother   . Heart disease Mother   . Heart attack Mother        MI at age 34  . Asthma Brother   . Hypertension Brother        younger  . Heart disease Brother        older  . Dementia Paternal Aunt        Unspecified type; symptoms emerged with advanced age  . Colon cancer Neg Hx   . Esophageal cancer Neg Hx   . Stomach cancer Neg Hx   . Kidney disease Neg Hx   . Liver disease Neg Hx   . Pancreatic cancer Neg Hx    Scheduled Meds: . arformoterol  15 mcg Nebulization BID  . budesonide (PULMICORT) nebulizer solution  0.5 mg Nebulization BID  . Chlorhexidine Gluconate Cloth  6 each Topical Daily  . collagenase   Topical Daily  . famotidine  20 mg Oral Daily  . feeding supplement (ENSURE ENLIVE)  237 mL Oral TID BM  . mouth rinse  15 mL Mouth Rinse BID  . midodrine  5 mg Oral TID WC  . sodium chloride flush  10-40 mL Intracatheter Q12H   Continuous Infusions: . ceFEPime (MAXIPIME) IV 2 g (12/26/19 0828)  . fluconazole (DIFLUCAN) IV 100 mg (12/26/19 1209)   PRN Meds:.acetaminophen **OR** acetaminophen, albuterol, antiseptic oral rinse, docusate sodium, glycopyrrolate **OR** glycopyrrolate **OR** glycopyrrolate, HYDROmorphone (DILAUDID) injection, LORazepam **OR** LORazepam **OR** LORazepam, ondansetron **OR** ondansetron (ZOFRAN) IV, polyethylene glycol, polyvinyl alcohol, Resource ThickenUp Clear, sodium chloride flush, white petrolatum   Allergies  Allergen Reactions  . Other Anaphylaxis    Reaction to tree nuts  . Amoxicillin-Pot Clavulanate Other (See Comments)    headache  . Ciprofloxacin Nausea Only and Rash  . Food Hives and Other (See Comments)    Potato- can eat them but cannot handle them, causes rash  . Keflex [Cephalexin] Rash  . Penicillins Rash    Headache. And hives - Dose not tolerate Cephalosporin either   Has  patient had a PCN reaction causing  immediate rash, facial/tongue/throat swelling, SOB or lightheadedness with hypotension: No Has patient had a PCN reaction causing severe rash involving mucus membranes or skin necrosis: No Has patient had a PCN reaction that required hospitalization: No Has patient had a PCN reaction occurring within the last 10 years: Yes (reaction was May 2020) If all of the above answers are "NO", then may proce  . Rocephin [Ceftriaxone] Rash  . Tomato Hives    can eat them but cannot handle them, causes rash    Physical Exam Vitals reviewed.  Constitutional:      General: She is not in acute distress.    Comments: Chronically ill-appearing  Pulmonary:     Effort: Pulmonary effort is normal.  Neurological:     Mental Status: She is alert and oriented to person, place, and time.     Vital Signs: BP (!) 108/55 (BP Location: Left Arm)   Pulse 80   Temp 98.3 F (36.8 C) (Oral)   Resp 16   Ht _0  (1.626 m)   Wt (!) 112.6 kg   LMP 06/11/2012   SpO2 96%   BMI 42.61 kg/m  Pain Scale: 0-10 POSS *See Group Information*: 1-Acceptable,Awake and alert Pain Score: 0-No pain   SpO2: SpO2: 96 % O2 Device:SpO2: 96 % O2 Flow Rate: .   IO: Intake/output summary:   Intake/Output Summary (Last 24 hours) at 12/26/2019 1422 Last data filed at 12/26/2019 1300 Gross per 24 hour  Intake 721.69 ml  Output 800 ml  Net -78.31 ml    LBM: Last BM Date: 12/25/19 Baseline Weight: Weight: (!) 106.9 kg Most recent weight: Weight: (!) 112.6 kg      Palliative Assessment/Data: 20%    Time In: 11:20 Time Out: 12:30 Time Total: 70 minutes Greater than 50%  of this time was spent counseling and coordinating care related to the above assessment and plan.  Signed by: Lavena Bullion, NP   Please contact Palliative Medicine Team phone at [phone] for questions and concerns.  For individual provider: See Shea Evans

## 2019-12-27 DIAGNOSIS — R627 Adult failure to thrive: Secondary | ICD-10-CM

## 2019-12-27 DIAGNOSIS — R5381 Other malaise: Secondary | ICD-10-CM

## 2019-12-27 DIAGNOSIS — Z515 Encounter for palliative care: Secondary | ICD-10-CM

## 2019-12-27 DIAGNOSIS — Z7189 Other specified counseling: Secondary | ICD-10-CM

## 2019-12-27 LAB — GLUCOSE, CAPILLARY
Glucose-Capillary: 86 mg/dL (ref 70–99)
Glucose-Capillary: 86 mg/dL (ref 70–99)

## 2019-12-27 LAB — VITAMIN E
Vitamin E (Alpha Tocopherol): 2.2 mg/L — ABNORMAL LOW (ref 9.0–29.0)
Vitamin E(Gamma Tocopherol): 0.6 mg/L (ref 0.5–4.9)

## 2019-12-27 LAB — VITAMIN C: Vitamin C: 0.1 mg/dL — ABNORMAL LOW (ref 0.4–2.0)

## 2019-12-27 LAB — VITAMIN A: Vitamin A (Retinoic Acid): 20.6 ug/dL — ABNORMAL LOW (ref 22.0–69.5)

## 2019-12-27 MED ORDER — POLYVINYL ALCOHOL 1.4 % OP SOLN: 1.0000 [drp] | Freq: Four times a day (QID) | OPHTHALMIC | 0 refills | Status: AC | PRN

## 2019-12-27 MED ORDER — GLYCOPYRROLATE 1 MG PO TABS: 1.0000 mg | ORAL_TABLET | ORAL | Status: AC | PRN

## 2019-12-27 MED ORDER — RESOURCE THICKENUP CLEAR PO POWD: ORAL | Status: AC

## 2019-12-27 MED ORDER — GLYCOPYRROLATE 0.2 MG/ML IJ SOLN: 0.2000 mg | INTRAMUSCULAR | Status: AC | PRN

## 2019-12-27 MED ORDER — BIOTENE DRY MOUTH MT LIQD: 15.0000 mL | OROMUCOSAL | Status: AC | PRN

## 2019-12-27 MED ORDER — LORAZEPAM 2 MG/ML IJ SOLN: 1.0000 mg | INTRAMUSCULAR | 0 refills | Status: AC | PRN

## 2019-12-27 MED ORDER — ARFORMOTEROL TARTRATE 15 MCG/2ML IN NEBU: 15.0000 ug | INHALATION_SOLUTION | Freq: Two times a day (BID) | RESPIRATORY_TRACT | Status: AC

## 2019-12-27 MED ORDER — BUDESONIDE 0.5 MG/2ML IN SUSP: 0.5000 mg | Freq: Two times a day (BID) | RESPIRATORY_TRACT | 12 refills | Status: AC

## 2019-12-27 MED ORDER — PROMETHAZINE HCL 25 MG/ML IJ SOLN: 12.5000 mg | Freq: Four times a day (QID) | INTRAMUSCULAR | 0 refills | Status: AC | PRN

## 2019-12-27 MED ORDER — LORAZEPAM 2 MG/ML PO CONC: 1.0000 mg | ORAL | 0 refills | Status: AC | PRN

## 2019-12-27 MED ORDER — ONDANSETRON 4 MG PO TBDP: 4.0000 mg | ORAL_TABLET | Freq: Four times a day (QID) | ORAL | 0 refills | Status: AC | PRN

## 2019-12-27 MED ORDER — WHITE PETROLATUM EX OINT: 1.0000 "application " | TOPICAL_OINTMENT | CUTANEOUS | 0 refills | Status: AC | PRN

## 2019-12-27 MED ORDER — DOCUSATE SODIUM 100 MG PO CAPS: 100.0000 mg | ORAL_CAPSULE | Freq: Two times a day (BID) | ORAL | 0 refills | Status: AC | PRN

## 2019-12-27 MED ORDER — FAMOTIDINE 20 MG PO TABS: 20.0000 mg | ORAL_TABLET | Freq: Every day | ORAL | Status: AC

## 2019-12-27 MED ORDER — POLYETHYLENE GLYCOL 3350 17 G PO PACK: 17.0000 g | PACK | Freq: Every day | ORAL | 0 refills | Status: AC | PRN

## 2019-12-27 MED ORDER — ONDANSETRON HCL 4 MG/2ML IJ SOLN: 4.0000 mg | Freq: Four times a day (QID) | INTRAMUSCULAR | 0 refills | Status: AC | PRN

## 2019-12-27 MED ORDER — LORAZEPAM 1 MG PO TABS: 1.0000 mg | ORAL_TABLET | ORAL | 0 refills | Status: AC | PRN

## 2019-12-27 NOTE — Care Management (Signed)
1017 12-27-19 Case Manager called transport to [facility]-family is in the room and aware of transportation. Staff RN is aware and will call report to [facility]. Case Manager will continue to monitor for additional transition of care needs. Graves-Bigelow, Ocie Cornfield, RN, BSN Case Manager

## 2019-12-27 NOTE — Telephone Encounter (Signed)
Latasha notified.

## 2019-12-27 NOTE — Telephone Encounter (Signed)
Yes

## 2019-12-27 NOTE — Progress Notes (Signed)
Progress Note    [name]  MLJ:[id number] DOB: 1955/05/28  DOA: 12/20/2019 PCP: Debbrah Alar, NP    Brief Narrative:     Medical records reviewed and are as summarized below:  [name] is an 64 y.o. female who has a PMH as outlined below.  She presented to [facility] ED 7/27 from rehab due to left sided abdominal discomfort and generalized weakness.  She had recent admission 10/14/19 through 10/22/19 for sepsis 2/2 E.coli UTI.  She was discharged to rehab facility after that admission for ongoing rehab.  Per brother, over the past 2 weeks or so, pt has not been able to stand at all.  She has basically been bedridden.  Found to be in septic shock.  Blood sugars being low and poor PO intake with nausea continued to be an issues.  Palliative care was consulted and plan to transition to comfort care.    Assessment/Plan:   Active Problems:   Morbid obesity ([place])   Anemia   Sepsis ([place])   Debilitated patient   AKI (acute kidney injury) ([place])   Pressure injury of coccygeal region, unstageable ([place])   Malnutrition of moderate degree  On 8/2 family met with palliative care and plan is to transition to comfort measures.  Septic Shock in setting of UTI  Numerous E. Coli UTI infections in past with severe urosepsis -IV abx (cefepime) -start to wean midodrine as tolerated -progressive care for now -culture from 7/31 growing yeast: started coverage 8/1 x 3 days with stop date of 8/3  Acute Metabolic Encephalopathy In setting of septic shock  -supportive care -promotes sleep / wake cycle -minimize sedating medications -multiple labs pending: Vit A/E/B1  Coagulopathy  Thrombocytopenia  Anemia  -per PCCM: Doubt DIC given high fibrinogen, s/p Vit K, PRBC, hx of low copper  -trend CBC, INR, LFT's  -transfuse for Hgb <7 -monitor for bleeding  -ADAMS13 mildly low-- ?? Sepsis -LDH normal  -plts tending up, monitor closely- suspect low due to sepsis -transition to  comfort care   Hypoglycemia vs hyperglycemia -POC not consistent with lab draws: 69 vs 238  Etiology: D5 going into picc and labs drawn from PICC-- FINGER STICKS [place] so patient has been started on d5  AKI Hyperkalemia  Hematuria Non-obstructing renal calculi on CT  -Trend BMP / urinary output -Replace electrolytes as indicated -Avoid nephrotoxic agents, ensure adequate renal perfusion  Hx PAF (on eliquis), dCHF -hold home eliquis with hematuria  -hold home diltiazem, furosemide, spironolactone  due to hypotension/AKI  Hypothyroidism TSH 0.193 7/27 -continue synthroid  Hx COPD -continue bronchodilators   Sacral wound present on admission -wound care per WOC recommendations   Goals of Care -DNR/DNI -brother is [place] -Palliative care consult appreciated today: plan to transition to comfort care  Nausea -PRN zofran  Morbid obesity Body mass index is 42.61 kg/m.  Nutrition Status: Nutrition Problem: Moderate Malnutrition Etiology: chronic illness Signs/Symptoms: mild fat depletion, moderate muscle depletion, edema, energy intake < 75% for > or equal to 1 month Interventions: Refer to RD note for recommendations   Pressure Injury 12/13/18 Sacrum Mid Deep Tissue Pressure Injury - Purple or maroon localized area of discolored intact skin or blood-filled blister due to damage of underlying soft tissue from pressure and/or shear. deep tissue injury beginning to evolve (Active)  12/13/18 0500  Location: Sacrum  Location Orientation: Mid  Staging: Deep Tissue Pressure Injury - Purple or maroon localized area of discolored intact skin  or blood-filled blister due to damage of underlying soft tissue from pressure and/or shear.  Wound Description (Comments): deep tissue injury beginning to evolve into unstageable when assessed on 7/28  Present on Admission: Yes     Pressure Injury 12/21/19 Buttocks Upper;Lateral;Left Stage 2 -  Partial  thickness loss of dermis presenting as a shallow open injury with a red, pink wound bed without slough. patchy areas of full thickness skin loss to bilat buttocks related to moisture (Active)  12/21/19 0100  Location: Buttocks  Location Orientation: Upper;Lateral;Left  Staging: Stage 2 -  Partial thickness loss of dermis presenting as a shallow open injury with a red, pink wound bed without slough.  Wound Description (Comments): patchy areas of full thickness skin loss to bilat buttocks related to moisture, NOT pressure  Present on Admission: Yes     Poor overall prognosis-- continue to have hypoglycemia, continues to have poor PO intake   Family Communication/Anticipated D/C date and plan/Code Status   DVT prophylaxis: scd Code Status: DNR Family Communication: brother/sister in law at bedside 8/2 Disposition Plan: Status is: Inpatient  Remains inpatient appropriate because:Hemodynamically unstable   Dispo: The patient is from: SNF              Anticipated d/c is to: tbd- hospice?              Anticipated d/c date is: when bed at hospice              Patient currently is medically ready to go to hospice         Medical Consultants:    PCCM  Palliative care  Subjective:   Had nausea again last night  Objective:    Vitals:   12/26/19 1153 12/26/19 1955 12/26/19 2048 12/27/19 0741  BP: (!) 108/55  (!) 123/51 (!) 106/44  Pulse: 80  78   Resp:   18   Temp: 98.3 F (36.8 C)  99 F (37.2 C) 97.8 F (36.6 C)  TempSrc: Oral  Oral Oral  SpO2: 96% 95% 96%   Weight:      Height:        Intake/Output Summary (Last 24 hours) at 12/27/2019 0757 Last data filed at 12/26/2019 2200 Gross per 24 hour  Intake 1573.98 ml  Output 800 ml  Net 773.98 ml   Filed Weights   12/23/19 0500 12/24/19 0500 12/25/19 0500  Weight: (!) 108.9 kg (!) 111 kg (!) 112.6 kg    Exam:   General: Appearance:    Severely obese female chronically ill appearing   +BS, obese abdomen    Lungs:      respirations unlabored      MS:   Right toe amputation noted.   Neurologic:   Awake, alert, confused to situation and time. No apparent focal neurological           defect.     Data Reviewed:   I have personally reviewed following labs and imaging studies:  Labs: Labs show the following:   Basic Metabolic Panel: Recent Labs  Lab 12/21/19 0230 12/21/19 1018 12/22/19 0249 12/22/19 0249 12/23/19 0343 12/23/19 0343 12/24/19 0409 12/24/19 0409 12/25/19 0100 12/25/19 0100 12/25/19 1200 12/26/19 0455  NA 138   < > 141   < > 145  --  145  --  141  --  139 140  K 5.3*   < > 4.1   < > 3.5   < > 3.5   < > 3.9   < >  4.0 3.6  CL 105   < > 110   < > 116*  --  117*  --  113*  --  111 111  CO2 22   < > 19*   < > 20*  --  20*  --  20*  --  19* 20*  GLUCOSE 55*   < > 314*   < > 271*  --  97  --  238*  --  92 235*  BUN 53*   < > 43*   < > 37*  --  32*  --  32*  --  34* 35*  CREATININE 2.98*   < > 2.57*   < > 2.35*  --  1.99*  --  1.76*  --  1.84* 1.87*  CALCIUM 7.7*   < > 7.3*   < > 7.8*  --  8.2*  --  8.3*  --  8.4* 8.2*  MG 2.6*  --  2.0  --   --   --   --   --   --   --   --   --   PHOS 5.2*  --  4.0  --   --   --   --   --   --   --   --   --    < > = values in this interval not displayed.   GFR Estimated Creatinine Clearance: 38.4 mL/min (A) (by C-G formula based on SCr of 1.87 mg/dL (H)). Liver Function Tests: Recent Labs  Lab 12/21/19 1018 12/21/19 1018 12/22/19 0249 12/23/19 0343 12/24/19 0409 12/25/19 0100 12/26/19 0455  AST 78*  --   --  40 33 22 21  ALT 67*  --   --  40 35 30 26  ALKPHOS 220*  --   --  162* 159* 142* 130*  BILITOT 1.0  --   --  0.8 0.5 0.9 0.8  PROT 4.8*  --   --  4.1* 4.3* 3.8* 3.8*  ALBUMIN 1.7*   < > 1.7* 1.4* 1.5* 1.3* 1.1*   < > = values in this interval not displayed.   No results for input(s): LIPASE, AMYLASE in the last 168 hours. No results for input(s): AMMONIA in the last 168 hours. Coagulation profile Recent Labs  Lab  12/20/19 1434 12/21/19 0230 12/21/19 1018 12/22/19 0249 12/23/19 1257  INR >10.0* 3.0* 2.3* 1.7* 1.4*    CBC: Recent Labs  Lab 12/20/19 1323 12/20/19 1755 12/22/19 0249 12/23/19 0343 12/23/19 1500 12/23/19 2323 12/24/19 0409 12/25/19 0100 12/26/19 0455  WBC 6.5   < > 7.4   < > 5.0 6.3 7.6 8.6 6.7  NEUTROABS 5.9  --  5.8  --   --   --   --   --   --   HGB 8.0*   < > 7.2*   < > 6.7* 8.0* 8.6* 7.9* 7.7*  HCT 26.3*   < > 23.5*   < > 22.7* 25.7* 27.8* 25.6* 26.0*  MCV 93.3   < > 94.4   < > 91.5 92.1 92.7 90.5 93.5  PLT PLATELET CLUMPS NOTED ON SMEAR, COUNT APPEARS DECREASED   < > 34*   < > 23* 26* 23* 33* 42*   < > = values in this interval not displayed.   Cardiac Enzymes: Recent Labs  Lab 12/21/19 1526  CKTOTAL 51   BNP (last 3 results) No results for input(s): PROBNP in the last 8760 hours. CBG: Recent Labs  Lab  12/26/19 0500 12/26/19 0610 12/26/19 0808 12/26/19 1026 12/26/19 1156  GLUCAP 114* 75 75 95 87   D-Dimer: No results for input(s): DDIMER in the last 72 hours. Hgb A1c: Recent Labs    12/26/19 0455  HGBA1C 4.3*   Lipid Profile: No results for input(s): CHOL, HDL, LDLCALC, TRIG, CHOLHDL, LDLDIRECT in the last 72 hours. Thyroid function studies: No results for input(s): TSH, T4TOTAL, T3FREE, THYROIDAB in the last 72 hours.  Invalid input(s): FREET3 Anemia work up: No results for input(s): VITAMINB12, FOLATE, FERRITIN, TIBC, IRON, RETICCTPCT in the last 72 hours. Sepsis Labs: Recent Labs  Lab 12/20/19 1323 12/20/19 1735 12/20/19 1755 12/23/19 2323 12/24/19 0409 12/25/19 0100 12/26/19 0455  WBC 6.5  --    < > 6.3 7.6 8.6 6.7  LATICACIDVEN 0.7 1.4  --   --   --   --   --    < > = values in this interval not displayed.    Microbiology Recent Results (from the past 240 hour(s))  Urine culture     Status: Abnormal   Collection Time: 12/20/19 12:33 PM   Specimen: Urine, Random  Result Value Ref Range Status   Specimen Description URINE,  RANDOM  Final   Special Requests   Final    NONE Performed at [facility], [address] [place], [place] [address]    Culture MULTIPLE SPECIES PRESENT, SUGGEST RECOLLECTION (A)  Final   Report Status 12/21/2019 FINAL  Final  Culture, blood (routine x 2)     Status: None   Collection Time: 12/20/19 12:39 PM   Specimen: BLOOD  Result Value Ref Range Status   Specimen Description BLOOD SITE NOT SPECIFIED  Final   Special Requests   Final    BOTTLES DRAWN AEROBIC AND ANAEROBIC Blood Culture results may not be optimal due to an inadequate volume of blood received in culture bottles   Culture   Final    NO GROWTH 5 DAYS Performed at [facility], [place] [address], [place], [place] [address]    Report Status 12/25/2019 FINAL  Final  Culture, blood (routine x 2)     Status: None   Collection Time: 12/20/19  1:24 PM   Specimen: BLOOD  Result Value Ref Range Status   Specimen Description BLOOD SITE NOT SPECIFIED  Final   Special Requests   Final    BOTTLES DRAWN AEROBIC AND ANAEROBIC Blood Culture adequate volume   Culture   Final    NO GROWTH 5 DAYS Performed at [facility], [place] [address], [place], [place] [address]    Report Status 12/25/2019 FINAL  Final  SARS Coronavirus 2 by RT PCR (hospital order, performed in [facility] hospital lab) Nasopharyngeal Nasopharyngeal Swab     Status: None   Collection Time: 12/20/19  5:22 PM   Specimen: Nasopharyngeal Swab  Result Value Ref Range Status   SARS Coronavirus 2 NEGATIVE NEGATIVE Final    Comment: (NOTE) SARS-CoV-2 target nucleic acids are NOT DETECTED.  The SARS-CoV-2 RNA is generally detectable in upper and lower respiratory specimens during the acute phase of infection. The lowest concentration of SARS-CoV-2 viral copies this assay can detect is 250 copies / mL. A negative result does not preclude SARS-CoV-2 infection and should not be used as the sole basis for treatment or other patient management  decisions.  A negative result may occur with improper specimen collection / handling, submission of specimen other than nasopharyngeal swab, presence of viral  mutation(s) within the areas targeted by this assay, and inadequate number of viral copies (<250 copies / mL). A negative result must be combined with clinical observations, patient history, and epidemiological information.  Fact Sheet for Patients:   StrictlyIdeas.no  Fact Sheet for Healthcare Providers: BankingDealers.co.za  This test is not yet approved or  cleared by the [place] FDA and has been authorized for detection and/or diagnosis of SARS-CoV-2 by FDA under an Emergency Use Authorization (EUA).  This EUA will remain in effect (meaning this test can be used) for the duration of the COVID-19 declaration under Section 564(b)(1) of the Act, 21 U.S.C. section 360bbb-3(b)(1), unless the authorization is terminated or revoked sooner.  Performed at [facility], [place] [address], [place], [place] [address]   MRSA PCR Screening     Status: Abnormal   Collection Time: 12/21/19  1:31 AM   Specimen: Nasal Mucosa; Nasopharyngeal  Result Value Ref Range Status   MRSA by PCR POSITIVE (A) NEGATIVE Final    Comment:        The GeneXpert MRSA Assay (FDA approved for NASAL specimens only), is one component of a comprehensive MRSA colonization surveillance program. It is not intended to diagnose MRSA infection nor to guide or monitor treatment for MRSA infections. RESULT CALLED TO, READ BACK BY AND VERIFIED WITH: NEWLIN,T RN 12/21/2019 AT 0301 SKEEN,P Performed at [facility], [place] [address] [place], [place] [address]   Urine Culture     Status: Abnormal   Collection Time: 12/24/19  4:09 AM   Specimen: Urine, Random  Result Value Ref Range Status   Specimen Description URINE, RANDOM  Final   Special Requests   Final    NONE Performed at [facility], [place] [address] [place], [place] [address]    Culture >=100,000 COLONIES/mL YEAST (A)  Final   Report Status 12/25/2019 FINAL  Final    Procedures and diagnostic studies:  No results found.  Medications:    arformoterol  15 mcg Nebulization BID   budesonide (PULMICORT) nebulizer solution  0.5 mg Nebulization BID   Chlorhexidine Gluconate Cloth  6 each Topical Daily   collagenase   Topical Daily   famotidine  20 mg Oral Daily   feeding supplement (ENSURE ENLIVE)  237 mL Oral TID BM   mouth rinse  15 mL Mouth Rinse BID   midodrine  5 mg Oral TID WC   sodium chloride flush  10-40 mL Intracatheter Q12H   Continuous Infusions:  fluconazole (DIFLUCAN) IV 100 mg (12/26/19 1209)     LOS: 7 days   Geradine Girt  [facility]   How to contact the [facility] Attending or Consulting provider [place] or covering provider during after hours [place], for this patient?  1. Check the care team in [facility] and look for a) attending/consulting TR[facility] provider listed and b) the [facility] team listed 2. Log into [email or web address] and use [place]'s universal password to access. If you do not have the password, please contact the hospital operator. 3. Locate the [facility] provider you are looking for under [facility] and page to a number that you can be directly reached. 4. If you still have difficulty reaching the provider, please page the [facility] (Director on Call) for the Hospitalists listed on amion for assistance.  12/27/2019, 7:57 AM

## 2019-12-27 NOTE — Discharge Summary (Signed)
Physician Discharge Summary  [name] YIR:[id number] DOB: May 10, 1961 DOA: 12/20/2019  PCP: Debbrah Alar, NP  Admit date: 12/20/2019 Discharge date: 12/27/2019  Admitted From: Rehab Discharge disposition: [facility]   Recommendations for Outpatient Follow-Up:   1. Being discharge to residential [facility]   Discharge Diagnosis:   Active Problems:   Morbid obesity ([place])   Anemia   Sepsis ([place])   Debilitated patient   AKI (acute kidney injury) ([place])   Pressure injury of coccygeal region, unstageable ([place])   Malnutrition of moderate degree    Discharge Condition: Comfort care discharging to Residential Hospice  Diet recommendation: as tolerated  Wound care: None.  Code status: DNR   History of Present Illness:   [name] is a 64 y.o. female who has a PMH that includes morbid obesity, hypothyroidism, anemia, PAF, endometrial adenocarcinoma, osteomylitis, recurrent UTI, E Coli bacteremia, chronic anticoagulation, mild neurocognitive disorder presented to [facility] ED 7/27 from rehab due to left sided abdominal discomfort and generalized weakness.  She had recent admission 10/14/19 through 10/22/19 for sepsis 2/2 E.coli UTI.  She was discharged to rehab facility after that admission for ongoing rehab.  Per brother, over the previous 2 weeks pt has not been able to stand at all.  She has basically been bedridden.  On 7/27, she wasn't able to state his name and sounded confused.  Brother felt she likely had recurrent UTI's due to her behaving similarly in the past with prior UTI's.  She was also borderline hypotensive.  In ED, UA confirmed UTI.  She also had multiple metabolic derangements as well as anemia with significant coagulopathy (INR > 10).  She was given 56m vitamin K and has 1u PRBC ordered.  Besides bloody urine, there has been no obvious bleeding   Hospital Course by Problem:   On 8/2 family met with palliative care and plan is to  transition to comfort measures.  SepticShockin setting ofUTI Numerous E. Coli UTI infections in past with severe urosepsis. Was provided with IV abx (cefepime). culture from 7/31 growing yeast: started coverage 8/1 x 3 days with stop date of 8/3. Patient transitioned to comfort care and being discharged to residential Hospice  AcuteMetabolic Encephalopathy Insetting of septic shock.  Coagulopathy Thrombocytopenia  Anemia Per PCCM: DoubtDIC given high fibrinogen, s/p Vit K, PRBC, hx of low copper. ADAMS13 mildly low-- ?? Sepsis. LDH normal Suspect low due to sepsis -transition to comfort care  Hypoglycemia vs hyperglycemia -POC not consistent with lab draws: 69 vs 238. Patient was started on d5. Continues with hypoglycemia and poor po intake. Transitioning to [place]  AKI Hyperkalemia Hematuria Non-obstructing renal calculi on CT Transition to comfort care. Creatinine 1.8, potassium 3.6.  Hx PAF (on eliquis), dCHF. Home eliquis held due to hematuria, Home diltiazem, furosemide, spironolactone held due to hypotension/AKI. Transitioned to Comfort care and being discharged to residential Hospic  Hypothyroidism TSH 0.193 7/27  Hx COPD -Comfort care  Sacral wound present on admission -wound care per WOC recommendations  Goals of Care -DNR/DNI Brother is HBastrop-Palliative care consult and transition to comfort care  Nausea -PRN zofran  Morbid obesity Body mass index is 42.61 kg/m.  Nutrition Status: Nutrition Problem: Moderate Malnutrition Etiology: chronic illness Signs/Symptoms: mild fat depletion, moderate muscle depletion, edema, energy intake < 75% for > or equal to 1 month.   Pressure Injury 12/13/18 Sacrum Mid Deep Tissue Pressure Injury - Purple or maroon localized area of discolored intact skin  or blood-filled blister due to damage of underlying soft tissue from pressure and/or shear. deep tissue injury beginning to evolve  (Active)  12/13/18 0500  Location: Sacrum  Location Orientation: Mid  Staging: Deep Tissue Pressure Injury - Purple or maroon localized area of discolored intact skin or blood-filled blister due to damage of underlying soft tissue from pressure and/or shear.  Wound Description (Comments): deep tissue injury beginning to evolve into unstageable when assessed on 7/28  Present on Admission: Yes     Pressure Injury 12/21/19 Buttocks Upper;Lateral;Left Stage 2 -  Partial thickness loss of dermis presenting as a shallow open injury with a red, pink wound bed without slough. patchy areas of full thickness skin loss to bilat buttocks related to moisture (Active)  12/21/19 0100  Location: Buttocks  Location Orientation: Upper;Lateral;Left  Staging: Stage 2 -  Partial thickness loss of dermis presenting as a shallow open injury with a red, pink wound bed without slough.  Wound Description (Comments): patchy areas of full thickness skin loss to bilat buttocks related to moisture, NOT pressure  Present on Admission: Yes     Poor overall prognosis-- continue to have hypoglycemia, continues to have poor PO intake      Medical Consultants:   Critical Care Palliative care   Discharge Exam:   Vitals:   12/27/19 0814 12/27/19 0816  BP:    Pulse:    Resp:    Temp:    SpO2: 96% 96%   Vitals:   12/26/19 2048 12/27/19 0741 12/27/19 0814 12/27/19 0816  BP: (!) 123/51 (!) 106/44    Pulse: 78     Resp: 18     Temp: 99 F (37.2 C) 97.8 F (36.6 C)    TempSrc: Oral Oral    SpO2: 96%  96% 96%  Weight:      Height:          The results of significant diagnostics from this hospitalization (including imaging, microbiology, ancillary and laboratory) are listed below for reference.     Procedures and Diagnostic Studies:   CT ABDOMEN PELVIS WO CONTRAST  Result Date: 12/20/2019 CLINICAL DATA:  Retroperitoneal bleed.  Weakness.  Abdominal pain. EXAM: CT ABDOMEN AND PELVIS WITHOUT  CONTRAST TECHNIQUE: Multidetector CT imaging of the abdomen and pelvis was performed following the standard protocol without IV contrast. COMPARISON:  Noncontrast CT 10/14/2019 FINDINGS: Lower chest: Heterogeneous confluent consolidation in the right lower lobe. There additional patchy opacities in the left lower and right middle lobes. Small right and trace left pleural effusions. Hepatobiliary: Prominent liver spanning 20 cm cranial caudal. No evidence of focal lesion, assessment limited by lack contrast and motion. Distended gallbladder with questionable sludge. Motion through the gallbladder limits detailed assessment. No calcified gallstone. Pancreas: Parenchymal atrophy. No ductal dilatation or inflammation. Pancreatic head region is obscured by motion and not well assessed. Spleen: Again seen mild splenomegaly with spleen spanning 13.5 cm. No evidence of focal lesion. Adrenals/Urinary Tract: No adrenal nodule or hemorrhage. No hydronephrosis. No perinephric edema. There are multiple bilateral nonobstructing renal calculi. No ureteral calculus. Foley catheter in the urinary bladder which is mildly distended. No definite bladder wall thickening. Stomach/Bowel: Nondistended stomach. No evidence of small bowel obstruction or inflammation. Appendix not visualized. Small intramural lipomas in the ascending (series 6, image 57) and transverse colon (series 3, image 52) without obstruction. Minor distal colonic diverticulosis without diverticulitis. Colonic tortuosity. Decreased stool burden from prior. Vascular/Lymphatic: Dense aortic atherosclerosis. No aortic aneurysm. There is no periaortic stranding. No bulky  abdominopelvic adenopathy. There is no retroperitoneal fluid. Reproductive: Again seen IUD in the uterus.  No adnexal mass. Other: Generalized edema in the subcutaneous tissues of both flanks. No retroperitoneal stranding or evidence of hemorrhage. Trace ascites adjacent to the liver which measures simple  density. No free air. Mild skin thickening in the posterior lower pelvis overlying the sacrum. No definite decubitus ulcer. Musculoskeletal: Multilevel degenerative change in the spine. There is fatty atrophy of the gluteal musculature. IMPRESSION: 1. No evidence of retroperitoneal hemorrhage. Generalized edema in the subcutaneous tissues of both flanks, can be seen with third-spacing. 2. Bilateral nonobstructing renal calculi. 3. Distended gallbladder with questionable sludge. No calcified gallstone. 4. Hepatosplenomegaly. 5. Additional incidental findings as described. Aortic Atherosclerosis (ICD10-I70.0). Electronically Signed   By: Keith Rake M.D.   On: 12/20/2019 19:39   CT Head Wo Contrast  Result Date: 12/20/2019 CLINICAL DATA:  Delirium EXAM: CT HEAD WITHOUT CONTRAST TECHNIQUE: Contiguous axial images were obtained from the base of the skull through the vertex without intravenous contrast. COMPARISON:  10/14/2019 FINDINGS: Streak artifact obscures a portion of the skull base and posterior fossa. Brain: There is no acute intracranial hemorrhage, mass effect, or edema. Gray-white differentiation is preserved. There is no extra-axial fluid collection. Ventricles and sulci are within normal limits in size and configuration. Patchy hypoattenuation in the supratentorial white matter is nonspecific but probably reflects mild chronic microvascular ischemic changes. Vascular: There is atherosclerotic calcification at the skull base. Skull: Calvarium is unremarkable. Sinuses/Orbits: No acute finding. Other: None. IMPRESSION: No acute intracranial abnormality. Stable chronic findings detailed above Electronically Signed   By: Macy Mis M.D.   On: 12/20/2019 19:38   DG Abdomen Acute W/Chest  Result Date: 12/20/2019 CLINICAL DATA:  Abdominal pain, diarrhea EXAM: DG ABDOMEN ACUTE W/ 1V CHEST COMPARISON:  2020 FINDINGS: Increased density at the right lung base. No pleural effusion. Normal heart size.  Bowel gas pattern is unremarkable. No free air. No significant stool burden. Intrauterine device is present. IMPRESSION: Unremarkable bowel gas pattern. Right basilar atelectasis/consolidation. Electronically Signed   By: Macy Mis M.D.   On: 12/20/2019 15:24   [place] EKG SITE RITE  Result Date: 12/21/2019 If Site Rite image not attached, placement could not be confirmed due to current cardiac rhythm.    Labs:   Basic Metabolic Panel: Recent Labs  Lab 12/21/19 0230 12/21/19 1018 12/22/19 0249 12/22/19 0249 12/23/19 0343 12/23/19 0343 12/24/19 0409 12/24/19 0409 12/25/19 0100 12/25/19 0100 12/25/19 1200 12/26/19 0455  NA 138   < > 141   < > 145  --  145  --  141  --  139 140  K 5.3*   < > 4.1   < > 3.5   < > 3.5   < > 3.9   < > 4.0 3.6  CL 105   < > 110   < > 116*  --  117*  --  113*  --  111 111  CO2 22   < > 19*   < > 20*  --  20*  --  20*  --  19* 20*  GLUCOSE 55*   < > 314*   < > 271*  --  97  --  238*  --  92 235*  BUN 53*   < > 43*   < > 37*  --  32*  --  32*  --  34* 35*  CREATININE 2.98*   < > 2.57*   < > 2.35*  --  1.99*  --  1.76*  --  1.84* 1.87*  CALCIUM 7.7*   < > 7.3*   < > 7.8*  --  8.2*  --  8.3*  --  8.4* 8.2*  MG 2.6*  --  2.0  --   --   --   --   --   --   --   --   --   PHOS 5.2*  --  4.0  --   --   --   --   --   --   --   --   --    < > = values in this interval not displayed.   GFR Estimated Creatinine Clearance: 38.4 mL/min (A) (by C-G formula based on SCr of 1.87 mg/dL (H)). Liver Function Tests: Recent Labs  Lab 12/21/19 1018 12/21/19 1018 12/22/19 0249 12/23/19 0343 12/24/19 0409 12/25/19 0100 12/26/19 0455  AST 78*  --   --  40 33 22 21  ALT 67*  --   --  40 35 30 26  ALKPHOS 220*  --   --  162* 159* 142* 130*  BILITOT 1.0  --   --  0.8 0.5 0.9 0.8  PROT 4.8*  --   --  4.1* 4.3* 3.8* 3.8*  ALBUMIN 1.7*   < > 1.7* 1.4* 1.5* 1.3* 1.1*   < > = values in this interval not displayed.   No results for input(s): LIPASE, AMYLASE in the last  168 hours. No results for input(s): AMMONIA in the last 168 hours. Coagulation profile Recent Labs  Lab 12/20/19 1434 12/21/19 0230 12/21/19 1018 12/22/19 0249 12/23/19 1257  INR >10.0* 3.0* 2.3* 1.7* 1.4*    CBC: Recent Labs  Lab 12/20/19 1323 12/20/19 1755 12/22/19 0249 12/23/19 0343 12/23/19 1500 12/23/19 2323 12/24/19 0409 12/25/19 0100 12/26/19 0455  WBC 6.5   < > 7.4   < > 5.0 6.3 7.6 8.6 6.7  NEUTROABS 5.9  --  5.8  --   --   --   --   --   --   HGB 8.0*   < > 7.2*   < > 6.7* 8.0* 8.6* 7.9* 7.7*  HCT 26.3*   < > 23.5*   < > 22.7* 25.7* 27.8* 25.6* 26.0*  MCV 93.3   < > 94.4   < > 91.5 92.1 92.7 90.5 93.5  PLT PLATELET CLUMPS NOTED ON SMEAR, COUNT APPEARS DECREASED   < > 34*   < > 23* 26* 23* 33* 42*   < > = values in this interval not displayed.   Cardiac Enzymes: Recent Labs  Lab 12/21/19 1526  CKTOTAL 51   BNP: Invalid input(s): POCBNP CBG: Recent Labs  Lab 12/26/19 0610 12/26/19 0808 12/26/19 1026 12/26/19 1156 12/26/19 1350  GLUCAP 75 75 95 87 86   D-Dimer No results for input(s): DDIMER in the last 72 hours. Hgb A1c Recent Labs    12/26/19 0455  HGBA1C 4.3*   Lipid Profile No results for input(s): CHOL, HDL, LDLCALC, TRIG, CHOLHDL, LDLDIRECT in the last 72 hours. Thyroid function studies No results for input(s): TSH, T4TOTAL, T3FREE, THYROIDAB in the last 72 hours.  Invalid input(s): FREET3 Anemia work up No results for input(s): VITAMINB12, FOLATE, FERRITIN, TIBC, IRON, RETICCTPCT in the last 72 hours. Microbiology Recent Results (from the past 240 hour(s))  Urine culture     Status: Abnormal   Collection Time: 12/20/19 12:33 PM   Specimen: Urine, Random  Result Value Ref  Range Status   Specimen Description URINE, RANDOM  Final   Special Requests   Final    NONE Performed at [facility], [address], [place], [place] [address]    Culture MULTIPLE SPECIES PRESENT, SUGGEST RECOLLECTION (A)  Final   Report Status  12/21/2019 FINAL  Final  Culture, blood (routine x 2)     Status: None   Collection Time: 12/20/19 12:39 PM   Specimen: BLOOD  Result Value Ref Range Status   Specimen Description BLOOD SITE NOT SPECIFIED  Final   Special Requests   Final    BOTTLES DRAWN AEROBIC AND ANAEROBIC Blood Culture results may not be optimal due to an inadequate volume of blood received in culture bottles   Culture   Final    NO GROWTH 5 DAYS Performed at [facility], [place] [address], [place], [place] [address]    Report Status 12/25/2019 FINAL  Final  Culture, blood (routine x 2)     Status: None   Collection Time: 12/20/19  1:24 PM   Specimen: BLOOD  Result Value Ref Range Status   Specimen Description BLOOD SITE NOT SPECIFIED  Final   Special Requests   Final    BOTTLES DRAWN AEROBIC AND ANAEROBIC Blood Culture adequate volume   Culture   Final    NO GROWTH 5 DAYS Performed at [facility], [place] [address], [place], [place] [address]    Report Status 12/25/2019 FINAL  Final  SARS Coronavirus 2 by RT PCR (hospital order, performed in [facility] hospital lab) Nasopharyngeal Nasopharyngeal Swab     Status: None   Collection Time: 12/20/19  5:22 PM   Specimen: Nasopharyngeal Swab  Result Value Ref Range Status   SARS Coronavirus 2 NEGATIVE NEGATIVE Final    Comment: (NOTE) SARS-CoV-2 target nucleic acids are NOT DETECTED.  The SARS-CoV-2 RNA is generally detectable in upper and lower respiratory specimens during the acute phase of infection. The lowest concentration of SARS-CoV-2 viral copies this assay can detect is 250 copies / mL. A negative result does not preclude SARS-CoV-2 infection and should not be used as the sole basis for treatment or other patient management decisions.  A negative result may occur with improper specimen collection / handling, submission of specimen other than nasopharyngeal swab, presence of viral mutation(s) within the areas targeted by this assay, and  inadequate number of viral copies (<250 copies / mL). A negative result must be combined with clinical observations, patient history, and epidemiological information.  Fact Sheet for Patients:   StrictlyIdeas.no  Fact Sheet for Healthcare Providers: BankingDealers.co.za  This test is not yet approved or  cleared by the [place] FDA and has been authorized for detection and/or diagnosis of SARS-CoV-2 by FDA under an Emergency Use Authorization (EUA).  This EUA will remain in effect (meaning this test can be used) for the duration of the COVID-19 declaration under Section 564(b)(1) of the Act, 21 U.S.C. section 360bbb-3(b)(1), unless the authorization is terminated or revoked sooner.  Performed at [facility], [place] [address], [place],  [address]   MRSA PCR Screening     Status: Abnormal   Collection Time: 12/21/19  1:31 AM   Specimen: Nasal Mucosa; Nasopharyngeal  Result Value Ref Range Status   MRSA by PCR POSITIVE (A) NEGATIVE Final    Comment:        The GeneXpert MRSA Assay (FDA approved for NASAL specimens only), is one component of a comprehensive  MRSA colonization surveillance program. It is not intended to diagnose MRSA infection nor to guide or monitor treatment for MRSA infections. RESULT CALLED TO, READ BACK BY AND VERIFIED WITH: NEWLIN,T RN 12/21/2019 AT 0301 SKEEN,P Performed at [facility], [place] [address], [place], [place] [address]   Urine Culture     Status: Abnormal   Collection Time: 12/24/19  4:09 AM   Specimen: Urine, Random  Result Value Ref Range Status   Specimen Description URINE, RANDOM  Final   Special Requests   Final    NONE Performed at [facility], [place] [address], [place], [place] [address]    Culture >=100,000 COLONIES/mL YEAST (A)  Final   Report Status 12/25/2019 FINAL  Final     Discharge Instructions:   Discharge Instructions    Discharge wound care:    Complete by: As directed    As needed   Increase activity slowly   Complete by: As directed      Allergies as of 12/27/2019      Reactions   Other Anaphylaxis   Reaction to tree nuts   Amoxicillin-pot Clavulanate Other (See Comments)   headache   Ciprofloxacin Nausea Only, Rash   Food Hives, Other (See Comments)   Potato- can eat them but cannot handle them, causes rash   Keflex [cephalexin] Rash   Penicillins Rash   Headache. And hives - Dose not tolerate Cephalosporin either   Has patient had a PCN reaction causing immediate rash, facial/tongue/throat swelling, SOB or lightheadedness with hypotension: No Has patient had a PCN reaction causing severe rash involving mucus membranes or skin necrosis: No Has patient had a PCN reaction that required hospitalization: No Has patient had a PCN reaction occurring within the last 10 years: Yes (reaction was May 2020) If all of the above answers are "NO", then may proce   Rocephin [ceftriaxone] Rash   Tomato Hives   can eat them but cannot handle them, causes rash      Medication List    STOP taking these medications   apixaban 5 MG Tabs tablet Commonly known as: Eliquis   calcitRIOL 0.5 MCG capsule Commonly known as: ROCALTROL   Copper Caps 2 MG Caps Generic drug: Copper Gluconate   CRANBERRY CONCENTRATE PO   cyanocobalamin 1000 MCG/ML injection Commonly known as: (VITAMIN B-12)   Decubi-Vite Caps   desoximetasone 0.05 % cream Commonly known as: TOPICORT   diclofenac sodium 1 % Gel Commonly known as: VOLTAREN   diltiazem 120 MG 24 hr capsule Commonly known as: CARDIZEM CD   EpiPen 2-Pak 0.3 mg/0.3 mL Soaj injection Generic drug: EPINEPHrine   feeding supplement (PRO-STAT SUGAR FREE 64) Liqd   furosemide 40 MG tablet Commonly known as: LASIX   Gas-X 80 MG chewable tablet Generic drug: simethicone   hydrocortisone cream 1 %   lactulose 10 GM/15ML solution Commonly known as: CHRONULAC   levocetirizine 5  MG tablet Commonly known as: XYZAL   levothyroxine 100 MCG tablet Commonly known as: SYNTHROID   loperamide 2 MG capsule Commonly known as: IMODIUM   melatonin 5 MG Tabs   Myrbetriq 50 MG Tb24 tablet Generic drug: mirabegron ER   nystatin cream Commonly known as: MYCOSTATIN   nystatin powder Commonly known as: MYCOSTATIN/NYSTOP   omeprazole 40 MG capsule Commonly known as: PRILOSEC   ondansetron 4 MG tablet Commonly known as: Zofran Replaced by: ondansetron 4 MG/2ML Soln injection   PARoxetine 10 MG tablet Commonly known as: PAXIL  potassium chloride SA 20 MEQ tablet Commonly known as: KLOR-CON   PROBIOTIC DAILY PO   senna-docusate 8.6-50 MG tablet Commonly known as: Senokot-S   sodium bicarbonate 650 MG tablet   spironolactone 25 MG tablet Commonly known as: Aldactone   sulfamethoxazole-trimethoprim 800-160 MG tablet Commonly known as: BACTRIM DS   Symbicort 160-4.5 MCG/ACT inhaler Generic drug: budesonide-formoterol   SYRINGE 3CC/20GX1" 20G X 1" 3 ML Misc   traMADol 50 MG tablet Commonly known as: ULTRAM   traZODone 50 MG tablet Commonly known as: DESYREL   Tubersol 5 UNIT/0.1ML injection Generic drug: tuberculin   Vitamin D (Ergocalciferol) 1.25 MG (50000 UNIT) Caps capsule Commonly known as: DRISDOL   zafirlukast 20 MG tablet Commonly known as: ACCOLATE     TAKE these medications   acetaminophen 325 MG tablet Commonly known as: TYLENOL Take 2 tablets (650 mg total) by mouth every 6 (six) hours as needed for mild pain or moderate pain.   albuterol (2.5 MG/3ML) 0.083% nebulizer solution Commonly known as: PROVENTIL Take 3 mLs (2.5 mg total) by nebulization every 6 (six) hours as needed for wheezing or shortness of breath. What changed: Another medication with the same name was removed. Continue taking this medication, and follow the directions you see here.   antiseptic oral rinse Liqd Apply 15 mLs topically as needed for dry mouth.     arformoterol 15 MCG/2ML Nebu Commonly known as: BROVANA Take 2 mLs (15 mcg total) by nebulization 2 (two) times daily.   budesonide 0.5 MG/2ML nebulizer solution Commonly known as: PULMICORT Take 2 mLs (0.5 mg total) by nebulization 2 (two) times daily.   docusate sodium 100 MG capsule Commonly known as: COLACE Take 1 capsule (100 mg total) by mouth 2 (two) times daily as needed for mild constipation.   famotidine 20 MG tablet Commonly known as: PEPCID Take 1 tablet (20 mg total) by mouth daily. Start taking on: December 28, 2019   feeding supplement Liqd Take 1 Container by mouth 2 (two) times daily between meals.   glycopyrrolate 1 MG tablet Commonly known as: ROBINUL Take 1 tablet (1 mg total) by mouth every 4 (four) hours as needed (excessive secretions).   glycopyrrolate 0.2 MG/ML injection Commonly known as: ROBINUL Inject 1 mL (0.2 mg total) into the vein every 4 (four) hours as needed (excessive secretions).   glycopyrrolate 0.2 MG/ML injection Commonly known as: ROBINUL Inject 1 mL (0.2 mg total) into the skin every 4 (four) hours as needed (excessive secretions).   LORazepam 1 MG tablet Commonly known as: ATIVAN Take 1 tablet (1 mg total) by mouth every 4 (four) hours as needed for anxiety.   LORazepam 2 MG/ML concentrated solution Commonly known as: ATIVAN Place 0.5 mLs (1 mg total) under the tongue every 4 (four) hours as needed for anxiety.   LORazepam 2 MG/ML injection Commonly known as: ATIVAN Inject 0.5 mLs (1 mg total) into the vein every 4 (four) hours as needed for anxiety.   midodrine 5 MG tablet Commonly known as: PROAMATINE Take 5 mg by mouth 3 (three) times daily with meals. What changed: Another medication with the same name was removed. Continue taking this medication, and follow the directions you see here.   ondansetron 4 MG disintegrating tablet Commonly known as: ZOFRAN-ODT Take 1 tablet (4 mg total) by mouth every 6 (six) hours as needed  for nausea.   ondansetron 4 MG/2ML Soln injection Commonly known as: ZOFRAN Inject 2 mLs (4 mg total) into the vein every 6 (  six) hours as needed for nausea. Replaces: ondansetron 4 MG tablet   polyethylene glycol 17 g packet Commonly known as: MIRALAX / GLYCOLAX Take 17 g by mouth daily as needed for moderate constipation.   polyvinyl alcohol 1.4 % ophthalmic solution Commonly known as: LIQUIFILM TEARS Place 1 drop into both eyes 4 (four) times daily as needed for dry eyes.   promethazine 25 MG/ML injection Commonly known as: PHENERGAN Inject 0.5 mLs (12.5 mg total) into the vein every 6 (six) hours as needed.   Resource ThickenUp Clear Powd As needed   white petrolatum Oint Commonly known as: VASELINE Apply 1 application topically as needed for lip care.            Discharge Care Instructions  (From admission, onward)         Start     Ordered   12/27/19 0000  Discharge wound care:       Comments: As needed   12/27/19 0905            Time coordinating discharge: 12/27/19  Signed:  Radene Gunning NP  [facility] 12/27/2019, 9:23 AM

## 2019-12-27 NOTE — Progress Notes (Signed)
Engineer, maintenance [facility]) Hospital Liaison note.   Received request from [place] for family interest in [facility] with request for transfer 8/3. Chart reviewed and eligibility confirmed.   Spoke to brother [name] to confirm plan above.  Family agreeable to transfer today. CSW aware.  Will update TOC once consents completed.  Dr. Orpah Melter to assume care per family request.    RN please call report to [phone].  Thank you,   Clementeen Hoof, RN, BSN    [place] (listed on [place] under [facility] [place])   [phone]

## 2019-12-29 ENCOUNTER — Ambulatory Visit: Payer: 59 | Admitting: Podiatry

## 2019-12-30 ENCOUNTER — Inpatient Hospital Stay: Payer: 59 | Attending: Hematology & Oncology

## 2019-12-30 ENCOUNTER — Inpatient Hospital Stay: Payer: 59 | Admitting: Hematology & Oncology

## 2020-01-02 ENCOUNTER — Ambulatory Visit: Payer: 59 | Admitting: Podiatry

## 2020-01-12 ENCOUNTER — Ambulatory Visit: Payer: 59 | Admitting: Podiatry

## 2020-01-13 ENCOUNTER — Other Ambulatory Visit: Payer: 59

## 2020-01-25 NOTE — Progress Notes (Deleted)
HPI: FU atrial fibrillation. Myoview July 2012showed EF64% mild ischemia in the inferior wall. We elected to treat medically. Patient was also having palpitations and near syncopal episodes. A CardioNet revealed sinus rhythm with PACs, brief runs of PAT and brief runs of PAF. MRA in September of 2014 showed no aneurysm. Xarelto DCed previously due to GI bleed; WU showed esophagitis; anticoagulation resumed.ABIs November 2019-waveform analysis showed no evidence of significant right or left lower extremity arterial disease.  Echocardiogram June 2020 showed normal LV function.    Patient admitted recently with urinary tract infections and multiple metabolic derangements.  She was ultimately seen by palliative care and hospice was initiated.  Since last seen,   Current Outpatient Medications  Medication Sig Dispense Refill  . acetaminophen (TYLENOL) 325 MG tablet Take 2 tablets (650 mg total) by mouth every 6 (six) hours as needed for mild pain or moderate pain. 60 tablet 0  . albuterol (PROVENTIL) (2.5 MG/3ML) 0.083% nebulizer solution Take 3 mLs (2.5 mg total) by nebulization every 6 (six) hours as needed for wheezing or shortness of breath. 150 mL 1  . antiseptic oral rinse (BIOTENE) LIQD Apply 15 mLs topically as needed for dry mouth.    [name] arformoterol (BROVANA) 15 MCG/2ML NEBU Take 2 mLs (15 mcg total) by nebulization 2 (two) times daily. 120 mL   . budesonide (PULMICORT) 0.5 MG/2ML nebulizer solution Take 2 mLs (0.5 mg total) by nebulization 2 (two) times daily.  12  . docusate sodium (COLACE) 100 MG capsule Take 1 capsule (100 mg total) by mouth 2 (two) times daily as needed for mild constipation. 10 capsule 0  . famotidine (PEPCID) 20 MG tablet Take 1 tablet (20 mg total) by mouth daily.    . feeding supplement (BOOST HIGH PROTEIN) LIQD Take 1 Container by mouth 2 (two) times daily between meals.    [name] glycopyrrolate (ROBINUL) 0.2 MG/ML injection Inject 1 mL (0.2 mg total) into the vein  every 4 (four) hours as needed (excessive secretions). 1 mL   . glycopyrrolate (ROBINUL) 0.2 MG/ML injection Inject 1 mL (0.2 mg total) into the skin every 4 (four) hours as needed (excessive secretions). 1 mL   . glycopyrrolate (ROBINUL) 1 MG tablet Take 1 tablet (1 mg total) by mouth every 4 (four) hours as needed (excessive secretions).    . LORazepam (ATIVAN) 1 MG tablet Take 1 tablet (1 mg total) by mouth every 4 (four) hours as needed for anxiety. 30 tablet 0  . LORazepam (ATIVAN) 2 MG/ML concentrated solution Place 0.5 mLs (1 mg total) under the tongue every 4 (four) hours as needed for anxiety. 30 mL 0  . LORazepam (ATIVAN) 2 MG/ML injection Inject 0.5 mLs (1 mg total) into the vein every 4 (four) hours as needed for anxiety. 1 mL 0  . Maltodextrin-Xanthan Gum ([place]) POWD As needed    . midodrine (PROAMATINE) 5 MG tablet Take 5 mg by mouth 3 (three) times daily with meals.    . ondansetron (ZOFRAN) 4 MG/2ML SOLN injection Inject 2 mLs (4 mg total) into the vein every 6 (six) hours as needed for nausea. 2 mL 0  . ondansetron (ZOFRAN-ODT) 4 MG disintegrating tablet Take 1 tablet (4 mg total) by mouth every 6 (six) hours as needed for nausea. 20 tablet 0  . polyethylene glycol (MIRALAX / GLYCOLAX) 17 g packet Take 17 g by mouth daily as needed for moderate constipation. 14 each 0  . polyvinyl alcohol (LIQUIFILM TEARS)  1.4 % ophthalmic solution Place 1 drop into both eyes 4 (four) times daily as needed for dry eyes. 15 mL 0  . promethazine (PHENERGAN) 25 MG/ML injection Inject 0.5 mLs (12.5 mg total) into the vein every 6 (six) hours as needed. 1 mL 0  . white petrolatum (VASELINE) OINT Apply 1 application topically as needed for lip care.  0   No current facility-administered medications for this visit.     Past Medical History:  Diagnosis Date  . Abnormal Pap smear    years ago/no biopsy  . Allergic rhinitis   . Aortic atherosclerosis   . Arthritis    back- lower    . Asthma   . Atrial fibrillation 12/2010   OFF XARELTO LAST MONTH DUE TO BLEEDING IN STOOL  . Bursitis of hip left  . Chronic anticoagulation 12/16/2018   CHADS VASC=2 for sex and H/O HTN- she is on Eliquis  . Chronic diastolic (congestive) heart failure    pt unaware of this  . COPD (chronic obstructive pulmonary disease) with chronic bronchitis   . Decreased pulses in feet   . Dysrhythmia    afib, followed by Dr. Stanford Breed   . Edema of both legs   . Endometrial adenocarcinoma 02/21/2013   S/p D and C, has mirena, being followed by GYN   . Esophagitis 02/02/2014   Distal, linear erosions, noted on endoscopy  . Essential hypertension 01/05/2007   Echo Nov June 2020- EF 50-55%, mild LVH, normal LA   . Fatty liver 01/07/2012  . Fibroid 1974   fibroid cyst on left fallopian tube  . Fibromyalgia   . Foot ulcer    AREA HEALED RIGHT FOOT  . Generalized anxiety disorder   . GERD (gastroesophageal reflux disease)   . Headache    occasional sinus headache   . Hepatomegaly   . History of blood transfusion JULY 2015  . History of cardiomegaly 12/06/2010   Noted on CT  . History of E. coli septicemia   . History of papillary thyroid carcinoma 04/07/2011  . Hypocalcemia 12/10/2016  . Hypokalemia 12/10/2016  . Hypoparathyroidism 01/07/2011  . Hypothyroidism   . Iron deficiency anemia secondary to malabsorption  05/10/2007   Qualifier: Diagnosis of  By: Wynona Luna    . Kidney stone 10/09/2015   passed on their own  . Leukocytosis 03/24/2009   cta cheQualifier: Diagnosis of  By: Wynona Luna    . Major depressive disorder   . Mild neurocognitive disorder 06/28/2019  . Morbid obesity   . MRSA infection   . Nephrolithiasis 2/16, 9/16   SEES DR Risa Grill  . Osteomyelitis 06/14/2014  . PAF (paroxysmal atrial fibrillation) 02/05/2011   Documented 2012- NSR since.  On chronic anticoagulation.  . Pernicious anemia 02/20/2014   followed by Debbrah Alar  . Pneumonia   . PONV  (postoperative nausea and vomiting)   . Pyelonephritis 11/24/2018  . Rash    thighs and back  . Seizures    infancy secondary to fever  . Staph aureus infection   . Thoracic spondylosis 12/06/2010   Noted on CT  . Uterine cancer 2014   Mirena IUD  . UTI (urinary tract infection) 12/13/2018  . Vitamin B 12 deficiency   . Vitamin D deficiency 05/10/2007   Qualifier: Diagnosis of  By: Wynona Luna     Past Surgical History:  Procedure Laterality Date  . AMPUTATION Right 05/18/2014   Procedure: RIGHT FIFTH RAY AMPUTATION FOOT;  Surgeon: Wylene Simmer, MD;  Location: [facility];  Service: Orthopedics;  Laterality: Right;  . APPENDECTOMY    . BUNIONECTOMY     bilateral  . COLONOSCOPY    . COLONOSCOPY WITH PROPOFOL N/A 02/02/2014   Procedure: COLONOSCOPY WITH PROPOFOL;  Surgeon: Irene Shipper, MD;  Location: [facility];  Service: Endoscopy;  Laterality: N/A;  . cyst on ovary removed     . CYSTOSCOPY W/ RETROGRADES  03/03/2012   Procedure: CYSTOSCOPY WITH RETROGRADE PYELOGRAM;  Surgeon: Bernestine Amass, MD;  Location: [facility];  Service: Urology;  Laterality: Bilateral;  . CYSTOSCOPY W/ URETERAL STENT PLACEMENT Right 11/25/2017   Procedure: CYSTOSCOPY WITH RETROGRADE RIGHT URETERAL STENT PLACEMENT;  Surgeon: Franchot Gallo, MD;  Location: [facility];  Service: Urology;  Laterality: Right;  . CYSTOSCOPY W/ URETERAL STENT PLACEMENT Right 01/15/2018   Procedure: RIGHT STENT EXCHANGE;  Surgeon: Ardis Hughs, MD;  [facility]: [facility];  Service: Urology;  Laterality: Right;  . CYSTOSCOPY WITH RETROGRADE PYELOGRAM, URETEROSCOPY AND STENT PLACEMENT Left 08/25/2012   Procedure: CYSTOSCOPY WITH RETROGRADE PYELOGRAM, URETEROSCOPY;  Surgeon: Bernestine Amass, MD;  [facility];  Service: Urology;  Laterality: Left;  . CYSTOSCOPY WITH STENT PLACEMENT Left 01/15/2018   Procedure: LEFT STENT PLACEMENT;  Surgeon: Ardis Hughs, MD;  [facility];  Service: Urology;  Laterality: Left;  . CYSTOSCOPY  WITH STENT PLACEMENT Right 01/22/2018   Procedure: RIGHT STENT REMOVAL;  Surgeon: Ardis Hughs, MD;  [facility];  Service: Urology;  Laterality: Right;  . CYSTOSCOPY/URETEROSCOPY/HOLMIUM LASER/STENT PLACEMENT Right 12/23/2017   Procedure: RIGHT URETEROSCOPY STONE REMOVAL, HOLMIUM LASER, RIGHT /STENT EXCHANGE;  Surgeon: Ardis Hughs, MD;  [facility];  Service: Urology;  Laterality: Right;  . CYSTOSCOPY/URETEROSCOPY/HOLMIUM LASER/STENT PLACEMENT Left 01/22/2018   Procedure: LEFT URETEROSCOPY STONE REMOVAL HOLMIUM LASER LEFT STENT Freddi Starr;  Surgeon: Ardis Hughs, MD;  [facility];  Service: Urology;  Laterality: Left;  . DILATION AND CURETTAGE OF UTERUS N/A 02/21/2013   Procedure: DILATATION AND CURETTAGE;  Surgeon: Lyman Speller, MD;  Location: [facility];  Service: Gynecology;  Laterality: N/A;  . DILATION AND CURETTAGE OF UTERUS N/A 04/09/2015   Procedure: [place] IUD removal;  Surgeon: Megan Salon, MD;  Location: [facility];  Service: Gynecology;  Laterality: N/A;  Patient weight 307lbs  . ESOPHAGOGASTRODUODENOSCOPY (EGD) WITH PROPOFOL N/A 02/02/2014   Procedure: ESOPHAGOGASTRODUODENOSCOPY (EGD) WITH PROPOFOL;  Surgeon: Irene Shipper, MD;  [facility];  Service: Endoscopy;  Laterality: N/A;  . GASTRIC BYPASS  1974  . HOLMIUM LASER APPLICATION Left 01/27/4966   Procedure: HOLMIUM LASER APPLICATION;  Surgeon: Bernestine Amass, MD;  [facility];  Service: Urology;  Laterality: Left;  . LITHOTRIPSY  03/2012  . LITHOTRIPSY  2/16  . THYROIDECTOMY  05/15/2010  . TONSILLECTOMY AND ADENOIDECTOMY    . UPPER GASTROINTESTINAL ENDOSCOPY    . URETEROSCOPY  03/03/2012   Procedure: URETEROSCOPY;  Surgeon: Bernestine Amass, MD;  [facility];  Service: Urology;  Laterality: Left;  . URETEROSCOPY WITH HOLMIUM LASER LITHOTRIPSY Bilateral 01/15/2018   Procedure: CYSTOSCOPY/BILATERAL URETEROSCOPY WITH HOLMIUM LASER LITHOTRIPSY  STONE REMOVAL;  Surgeon: Ardis Hughs, MD;  Location: [facility];  Service: Urology;  Laterality: Bilateral;    Social History   Socioeconomic History  . Marital status: Single    Spouse name: Not on file  . Number of children: 0  . Years of education: 67  . Highest education level: Some college, no  degree  Occupational History  . Occupation: works in Insurance claims handler  . Occupation: DESIGN COMPUTER CHIP    Employer: [employer]  Tobacco Use  . Smoking status: Former Smoker    Packs/day: 0.50    Years: 25.00    Pack years: 12.50    Types: Cigarettes    Start date: 12/24/1970    Quit date: 05/27/1995    Years since quitting: 24.6  . Smokeless tobacco: Never Used  Vaping Use  . Vaping Use: Never used  Substance and Sexual Activity  . Alcohol use: Not Currently    Alcohol/week: 0.0 standard drinks    Comment: <12 oz per month  . Drug use: No  . Sexual activity: Yes    Partners: Male    Birth control/protection: Post-menopausal  Other Topics Concern  . Not on file  Social History Narrative   Occupation: works in Insurance claims handler - Field seismologist   Single       Former Smoker - quit tobacco 12 years ago.  She was light smoker for 10 years.             College edu      Right handed      Lives alone in one story home. Has steps to enter home.      Social Determinants of Health   Financial Resource Strain:   . Difficulty of Paying Living Expenses: Not on file  Food Insecurity:   . Worried About Charity fundraiser in the Last Year: Not on file  . Ran Out of Food in the Last Year: Not on file  Transportation Needs:   . Lack of Transportation (Medical): Not on file  . Lack of Transportation (Non-Medical): Not on file  Physical Activity:   . Days of Exercise per Week: Not on file  . Minutes of Exercise per Session: Not on file  Stress:   . Feeling of Stress : Not on file  Social Connections:   . Frequency of Communication with Friends and Family: Not  on file  . Frequency of Social Gatherings with Friends and Family: Not on file  . Attends Religious Services: Not on file  . Active Member of Clubs or Organizations: Not on file  . Attends Archivist Meetings: Not on file  . Marital Status: Not on file  Intimate Partner Violence:   . Fear of Current or Ex-Partner: Not on file  . Emotionally Abused: Not on file  . Physically Abused: Not on file  . Sexually Abused: Not on file    Family History  Problem Relation Age of Onset  . Hypertension Father   . Diabetes Father   . Lung cancer Father   . Heart attack Father        MI at age 49  . Hypertension Mother   . Hyperthyroidism Mother   . Heart disease Mother   . Heart attack Mother        MI at age 44  . Asthma Brother   . Hypertension Brother        younger  . Heart disease Brother        older  . Dementia Paternal Aunt        Unspecified type; symptoms emerged with advanced age  . Colon cancer Neg Hx   . Esophageal cancer Neg Hx   . Stomach cancer Neg Hx   . Kidney disease Neg Hx   . Liver disease Neg Hx   . Pancreatic cancer  Neg Hx     ROS: no fevers or chills, productive cough, hemoptysis, dysphasia, odynophagia, melena, hematochezia, dysuria, hematuria, rash, seizure activity, orthopnea, PND, pedal edema, claudication. Remaining systems are negative.  Physical Exam: Well-developed well-nourished in no acute distress.  Skin is warm and dry.  HEENT is normal.  Neck is supple.  Chest is clear to auscultation with normal expansion.  Cardiovascular exam is regular rate and rhythm.  Abdominal exam nontender or distended. No masses palpated. Extremities show no edema. neuro grossly intact  ECG- personally reviewed  A/P  1 paroxysmal atrial fibrillation-  2 hypertension-  3 lower extremity edema-  4 obesity-  Kirk Ruths, MD

## 2020-01-25 DEATH — deceased

## 2020-01-26 DIAGNOSIS — M79676 Pain in unspecified toe(s): Secondary | ICD-10-CM

## 2020-01-27 ENCOUNTER — Ambulatory Visit: Payer: 59 | Admitting: Cardiology

## 2020-03-01 IMAGING — CT CT RENAL STONE PROTOCOL
2 of 4 series · 16 of 46 positions shown, 18 images · non-contrast
Comparison: November 21, 2018

CLINICAL DATA: Flank pain

EXAM:
CT ABDOMEN AND PELVIS WITHOUT CONTRAST
TECHNIQUE: Multidetector CT imaging of the abdomen and pelvis was performed
following the standard protocol without IV contrast.

[Series 3: stone study 5.0 i30f 2 · axial · 0.98mm/px · z∈[+748,+1184]mm · 13 of 97 slices shown, 15 images]
[im 5/97  soft-tissue]
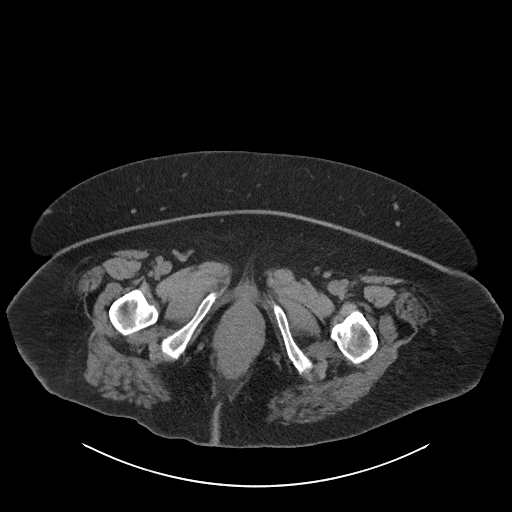
[im 5/97  bone]
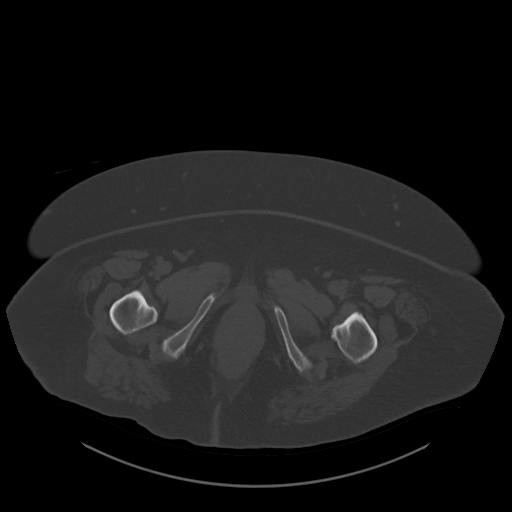
[im 15/97  soft-tissue]
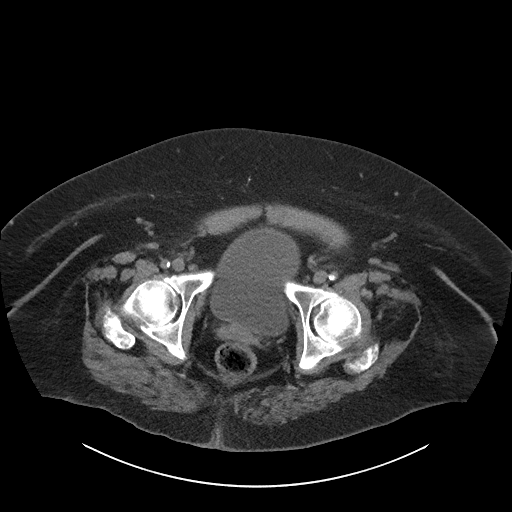
[im 20/97  soft-tissue]
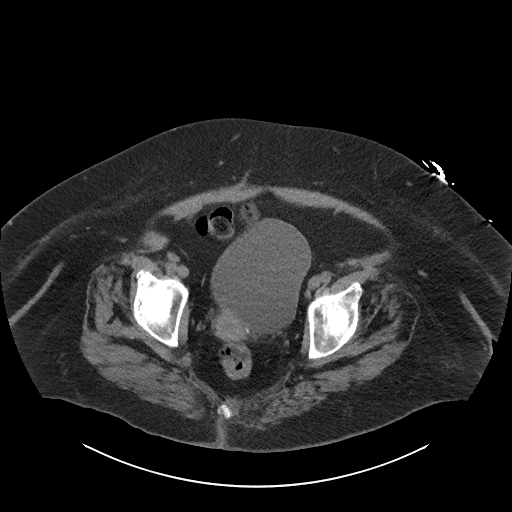
[im 29/97  soft-tissue]
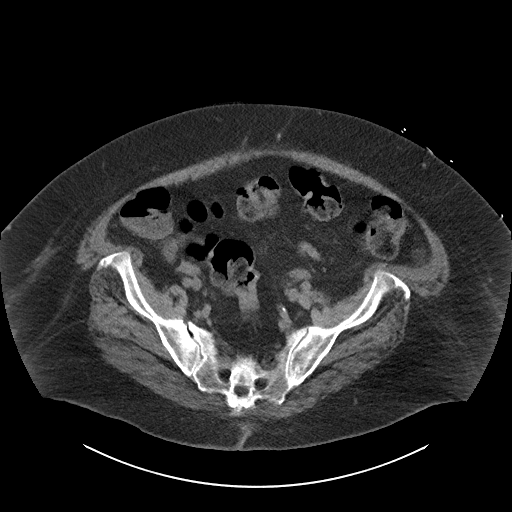
[im 34/97  soft-tissue]
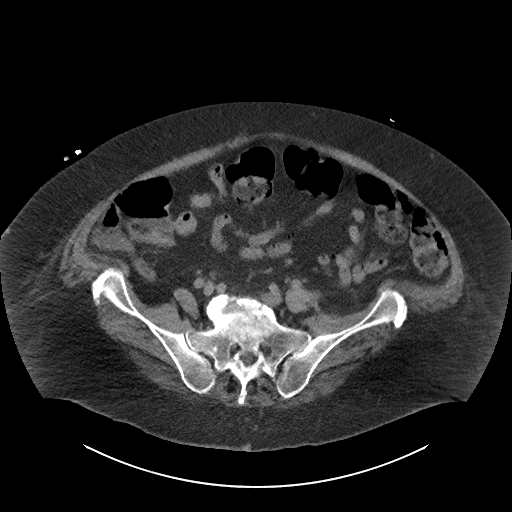
[im 44/97  soft-tissue]
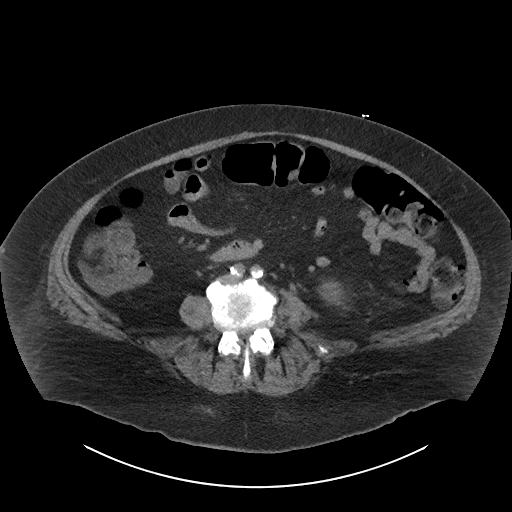
[im 49/97  soft-tissue]
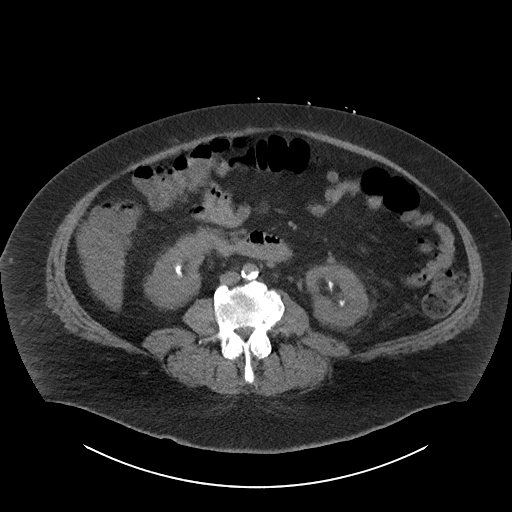
[im 53/97  soft-tissue]
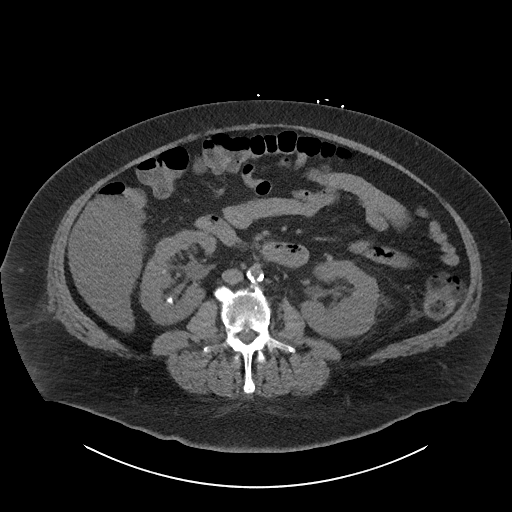
[im 63/97  soft-tissue]
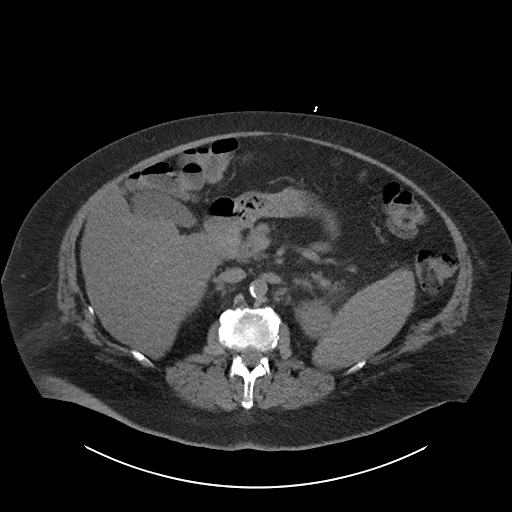
[im 63/97  bone]
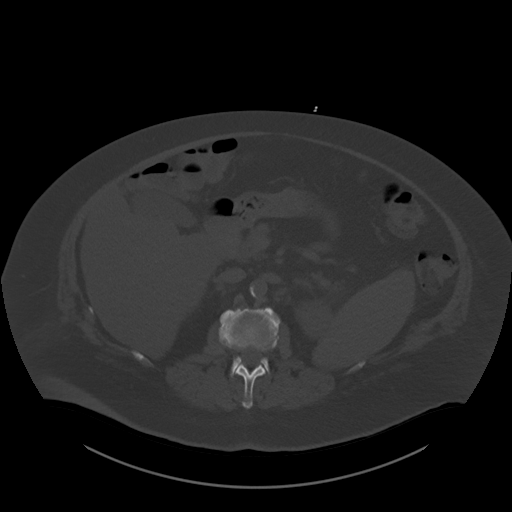
[im 68/97  soft-tissue]
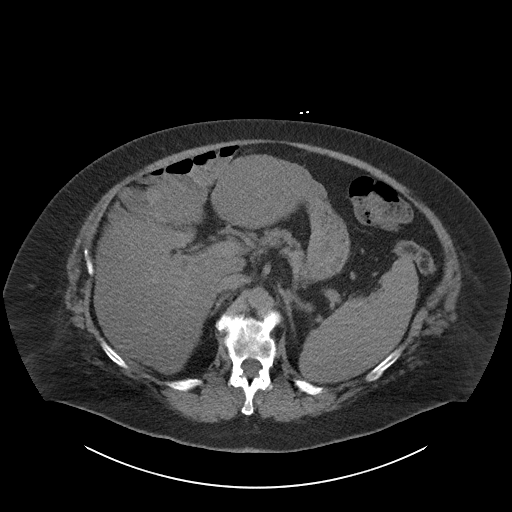
[im 77/97  soft-tissue]
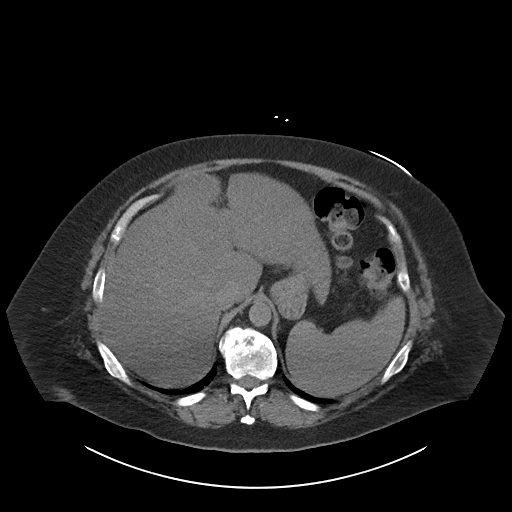
[im 82/97  soft-tissue]
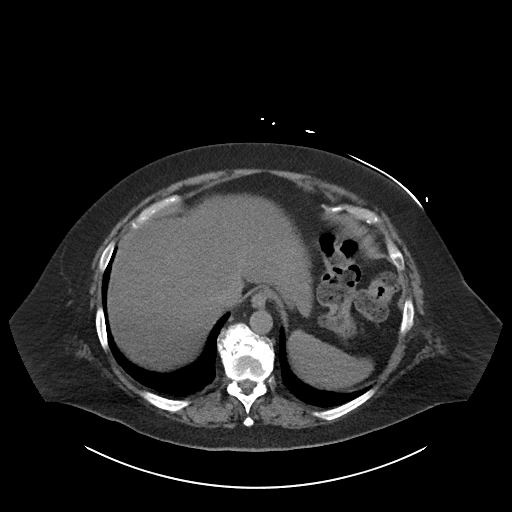
[im 92/97  soft-tissue]
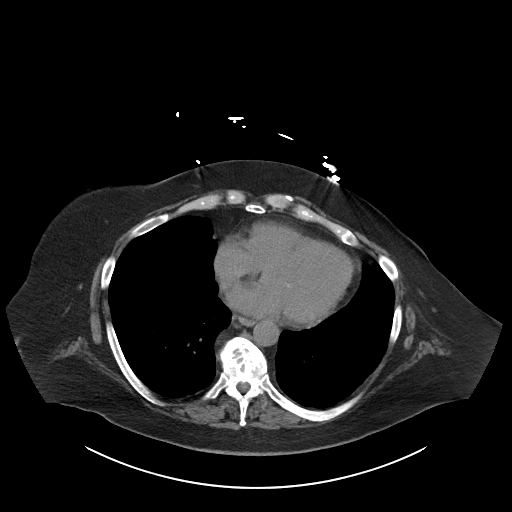

[Series 6: coronal soft tissue · coronal · 0.91mm/px · 3 of 109 slices shown]
[im 37/109  soft-tissue]
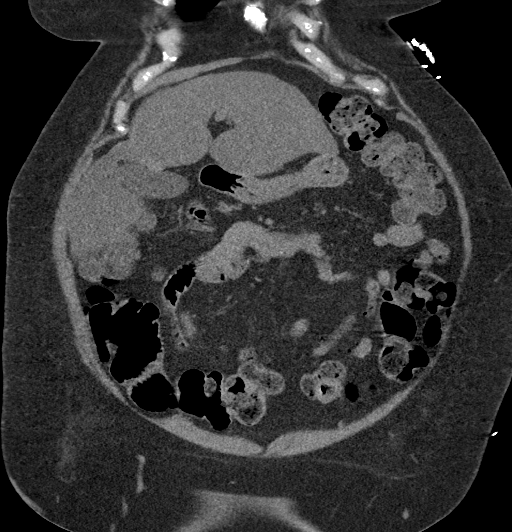
[im 49/109  soft-tissue]
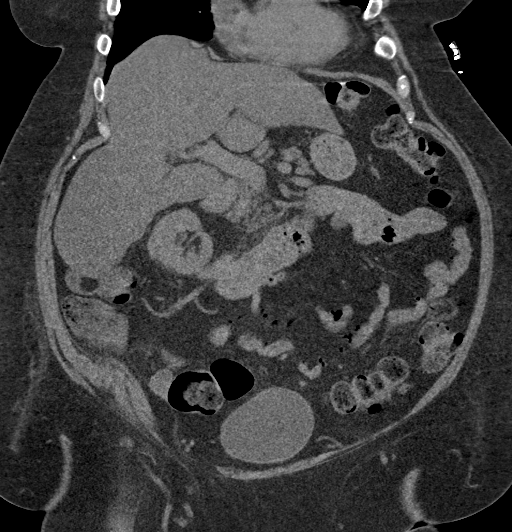
[im 61/109  soft-tissue]
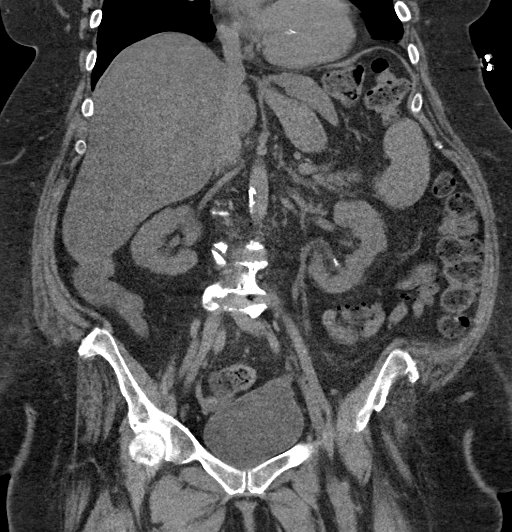

[16 of 46 positions shown; findings below may reference images not displayed]

FINDINGS: Lower chest: The lung bases are clear. The heart size is normal.

Hepatobiliary: There is decreased hepatic attenuation suggestive of
hepatic steatosis. The liver contour somewhat nodular. Normal
gallbladder.There is no biliary ductal dilation.

Pancreas: Normal contours without ductal dilatation. No
peripancreatic fluid collection.

Spleen: The spleen is enlarged measuring at least 13 cm.

Adrenals/Urinary Tract:

--Adrenal glands: No adrenal hemorrhage.

--Right kidney/ureter: There are multiple nonobstructing stones
measuring up to approximately 1 cm.

--Left kidney/ureter: There is fat stranding about the left kidney
with mild left-sided collecting system dilatation. There are
multiple nonobstructing stones in the lower pole the left kidney
measuring up to approximately 8 mm.

--Urinary bladder: There are new stones in the dependent portion of
the urinary bladder. There is a punctate focus of gas within the
urinary bladder.

Stomach/Bowel:

--Stomach/Duodenum: No hiatal hernia or other gastric abnormality.
Normal duodenal course and caliber.

--Small bowel: No dilatation or inflammation.

--Colon: No focal abnormality.

--Appendix: Not visualized. No right lower quadrant inflammation or
free fluid.

Vascular/Lymphatic: Atherosclerotic calcification is present within
the non-aneurysmal abdominal aorta, without hemodynamically
significant stenosis. There is likely moderate to high-grade
stenosis involving the proximal left common iliac artery secondary
to atherosclerotic disease

--No retroperitoneal lymphadenopathy.

--No mesenteric lymphadenopathy.

--No pelvic or inguinal lymphadenopathy.

Reproductive: Unremarkable

Other: No ascites or free air. The abdominal wall is normal.

Musculoskeletal. Multilevel degenerative disc disease and facet
arthrosis. No bony spinal canal stenosis.
IMPRESSION: 1. New fat stranding and free fluid about the left kidney with mild
left-sided collecting system dilatation as well as new stones in the
dependent portion of the urinary bladder is most consistent with
sequela of a recently passed nephrolith on the left.
2. Bilateral nephrolithiasis as detailed above.
3. Hepatic steatosis. The contour of the left hepatic lobe appears
nodular, raising concern for underlying cirrhosis. The spleen is
enlarged suggestive of underlying portal hypertension.
